# Patient Record
Sex: Male | Born: 1945
Health system: Southern US, Community
[De-identification: ages and names within clinical notes are randomized; demographics above are authoritative.]

## PROBLEM LIST (undated history)

## (undated) DIAGNOSIS — E119 Type 2 diabetes mellitus without complications: Secondary | ICD-10-CM

## (undated) DIAGNOSIS — I10 Essential (primary) hypertension: Secondary | ICD-10-CM

## (undated) DIAGNOSIS — E785 Hyperlipidemia, unspecified: Secondary | ICD-10-CM

## (undated) DIAGNOSIS — M1990 Unspecified osteoarthritis, unspecified site: Secondary | ICD-10-CM

## (undated) DIAGNOSIS — S22009A Unspecified fracture of unspecified thoracic vertebra, initial encounter for closed fracture: Secondary | ICD-10-CM

## (undated) DIAGNOSIS — N289 Disorder of kidney and ureter, unspecified: Secondary | ICD-10-CM

## (undated) DIAGNOSIS — I252 Old myocardial infarction: Secondary | ICD-10-CM

## (undated) DIAGNOSIS — I4891 Unspecified atrial fibrillation: Secondary | ICD-10-CM

## (undated) DIAGNOSIS — K8020 Calculus of gallbladder without cholecystitis without obstruction: Secondary | ICD-10-CM

## (undated) DIAGNOSIS — R011 Cardiac murmur, unspecified: Secondary | ICD-10-CM

## (undated) DIAGNOSIS — Z978 Presence of other specified devices: Secondary | ICD-10-CM

## (undated) DIAGNOSIS — Z86010 Personal history of colonic polyps: Secondary | ICD-10-CM

## (undated) HISTORY — DX: Type 2 diabetes mellitus without complications: E11.9

## (undated) HISTORY — DX: Disorder of kidney and ureter, unspecified: N28.9

## (undated) HISTORY — DX: Essential (primary) hypertension: I10

## (undated) HISTORY — PX: CATARACT EXTRACTION, BILATERAL: SHX1313

## (undated) HISTORY — DX: Unspecified fracture of unspecified thoracic vertebra, initial encounter for closed fracture: S22.009A

## (undated) HISTORY — DX: Presence of other specified devices: Z97.8

## (undated) HISTORY — DX: Calculus of gallbladder without cholecystitis without obstruction: K80.20

## (undated) HISTORY — PX: ESOPHAGOGASTRODUODENOSCOPY: SHX1529

## (undated) HISTORY — DX: Personal history of colonic polyps: Z86.010

## (undated) HISTORY — PX: DEBRIDEMENT TENNIS ELBOW: SHX1442

## (undated) HISTORY — DX: Hyperlipidemia, unspecified: E78.5

## (undated) HISTORY — PX: COLONOSCOPY: SHX174

## (undated) HISTORY — PX: TONSILLECTOMY AND ADENOIDECTOMY: SHX28

## (undated) HISTORY — PX: OTHER SURGICAL HISTORY: SHX169

---

## 2004-10-02 ENCOUNTER — Ambulatory Visit: Payer: Self-pay | Admitting: Family Medicine

## 2005-02-15 ENCOUNTER — Ambulatory Visit: Payer: Self-pay | Admitting: Family Medicine

## 2005-03-26 ENCOUNTER — Ambulatory Visit: Payer: Self-pay | Admitting: Family Medicine

## 2005-05-24 ENCOUNTER — Ambulatory Visit: Payer: Self-pay | Admitting: Family Medicine

## 2005-06-25 ENCOUNTER — Ambulatory Visit: Payer: Self-pay | Admitting: Family Medicine

## 2005-07-24 ENCOUNTER — Ambulatory Visit: Payer: Self-pay | Admitting: Family Medicine

## 2005-10-23 ENCOUNTER — Ambulatory Visit: Payer: Self-pay | Admitting: Family Medicine

## 2005-11-29 ENCOUNTER — Ambulatory Visit: Payer: Self-pay | Admitting: Family Medicine

## 2012-03-26 ENCOUNTER — Institutional Professional Consult (permissible substitution): Payer: Self-pay | Admitting: Cardiology

## 2013-09-23 ENCOUNTER — Encounter: Payer: Self-pay | Admitting: Internal Medicine

## 2013-10-21 ENCOUNTER — Encounter: Payer: Self-pay | Admitting: Internal Medicine

## 2013-10-21 ENCOUNTER — Ambulatory Visit (INDEPENDENT_AMBULATORY_CARE_PROVIDER_SITE_OTHER): Payer: Medicare HMO | Admitting: Internal Medicine

## 2013-10-21 VITALS — BP 194/70 | HR 64 | Ht 68.25 in | Wt 197.0 lb

## 2013-10-21 DIAGNOSIS — R198 Other specified symptoms and signs involving the digestive system and abdomen: Secondary | ICD-10-CM

## 2013-10-21 DIAGNOSIS — R634 Abnormal weight loss: Secondary | ICD-10-CM

## 2013-10-21 DIAGNOSIS — R197 Diarrhea, unspecified: Secondary | ICD-10-CM | POA: Insufficient documentation

## 2013-10-21 DIAGNOSIS — R194 Change in bowel habit: Secondary | ICD-10-CM

## 2013-10-21 MED ORDER — SUPREP BOWEL PREP KIT 17.5-3.13-1.6 GM/177ML PO SOLN: 1.0000 | ORAL | Status: DC

## 2013-10-21 NOTE — Progress Notes (Signed)
Subjective:  Referred by: Particia Nearing [facility]   Patient ID: Connor Bryant, male    DOB: [birth year], 68 y.o.   MRN: [id number]  HPI The patient is a very nice 68 year old man with a 1 year history of increasing gas and loose bowel movements. He has smaller caliber stools at times and intermittent basis as well. He has been on metformin for some time, and the symptoms started after the initiation of this but a good period of time like years after. Somewhere in here his dose was increased to 1000 mg at night along with his regular 500 mg in the morning but again he cannot correlate the change in bowel habits with change in metformin dose. He denies any rectal bleeding. He has never had a colonoscopy for screening. He has lost about 15 pounds in the last year according to his primary care provider. This is unintentional. His hemoglobin A1c has been below 6.5. This is per patient. GI review of systems is otherwise negative. No Known Allergies Outpatient Prescriptions Prior to Visit  Medication Sig Dispense Refill  . allopurinol (ZYLOPRIM) 100 MG tablet Take 100 mg by mouth daily.       Marland Kitchen atorvastatin (LIPITOR) 20 MG tablet Take 20 mg by mouth daily.      . hydrochlorothiazide (HYDRODIURIL) 25 MG tablet Take 25 mg by mouth daily.      . metFORMIN (GLUMETZA) 500 MG (MOD) 24 hr tablet Take 500 mg by mouth. Take 1 tablet in the morning and 2 tablets every evening.      . metoprolol succinate (TOPROL-XL) 100 MG 24 hr tablet Take 100 mg by mouth daily. Take with or immediately following a meal.      . trandolapril (MAVIK) 4 MG tablet Take 4 mg by mouth daily.      . verapamil (COVERA HS) 240 MG (CO) 24 hr tablet Take 240 mg by mouth. Take 2 capsules in the morning once a day      . trandolapril-verapamil (TARKA) 2-240 MG per tablet Take 2 tablets by mouth daily.       No facility-administered medications prior to visit.   Past Medical History  Diagnosis Date  . Thoracic spine fracture   . HTN  (hypertension)   . DM type 2 (diabetes mellitus, type 2)   . Hyperlipidemia   . Renal insufficiency     mild   Past Surgical History  Procedure Laterality Date  . Debridement tennis elbow Right    History   Social History  . Marital Status: Single    Spouse Name: N/A    Number of Children: N/A  . Years of Education: N/A   Social History Main Topics  . Smoking status: Former Research scientist (life sciences)  . Smokeless tobacco: None  . Alcohol Use: Yes     Comment: 3-4 beers every night  . Drug Use: No   Social History Narrative   Patient is married, he is retired. 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   Family History  Problem Relation Age of Onset  . Hypertension Father   . Coronary artery disease Father   . Hyperlipidemia Father   . Colon cancer Neg Hx   . Throat cancer Neg Hx   . Diabetes Mother   . Diabetes Father   . Heart disease Mother   . Kidney disease Neg Hx   . Bladder Cancer Father   . Liver disease Neg Hx     Review of Systems  Some joint pains, and tremors, bilateral. all other review of systems negative or as per history of present illness.    Objective:   Physical Exam General:  Well-developed, well-nourished and in no acute distress Eyes:  anicteric. ENT:   Mouth and posterior pharynx free of lesions.  Neck:   supple w/o thyromegaly or mass.  Lungs: Clear to auscultation bilaterally. Heart:  S1S2, no rubs, murmurs, gallops. Abdomen:  soft, non-tender, no hepatosplenomegaly, hernia, or mass and BS+.  Rectal: Deferred until colonoscopy Lymph:  no cervical or supraclavicular adenopathy. Extremities:   no edema Neuro:  A&O x 3.  Psych:  appropriate mood and  Affect.   Data Reviewed: PCP notes from 09/02/2013.     Assessment & Plan:   1. Change in bowel habits   2. Loss of weight

## 2013-10-21 NOTE — Assessment & Plan Note (Signed)
15 pound unintentional weight loss in the last year. He reports good control his blood sugars so I don't think it's related to hyperglycemia. Await colonoscopy.

## 2013-10-21 NOTE — Assessment & Plan Note (Signed)
Looser and more narrow caliber stools on an intermittent basis. Cause not clear. Will investigate with colonoscopy keeping in mind in metformin could be the cause.The risks and benefits as well as alternatives of endoscopic procedure(s) have been discussed and reviewed. All questions answered. The patient agrees to proceed.

## 2013-10-21 NOTE — Patient Instructions (Signed)
You have been scheduled for a colonoscopy with propofol. Please follow written instructions given to you at your visit today.  Please pick up your prep kit at the pharmacy within the next 1-3 days. If you use inhalers (even only as needed), please bring them with you on the day of your procedure. Your physician has requested that you go to www.startemmi.com and enter the access code given to you at your visit today. This web site gives a general overview about your procedure. However, you should still follow specific instructions given to you by our office regarding your preparation for the procedure.  Thank you for choosing me and [facility]!

## 2013-10-22 ENCOUNTER — Encounter: Payer: Self-pay | Admitting: Internal Medicine

## 2013-11-17 ENCOUNTER — Encounter: Payer: Self-pay | Admitting: Internal Medicine

## 2013-11-17 ENCOUNTER — Ambulatory Visit (AMBULATORY_SURGERY_CENTER): Payer: Medicare HMO | Admitting: Internal Medicine

## 2013-11-17 VITALS — BP 169/68 | HR 52 | Temp 99.4°F | Resp 19 | Ht 68.25 in | Wt 197.0 lb

## 2013-11-17 DIAGNOSIS — R634 Abnormal weight loss: Secondary | ICD-10-CM

## 2013-11-17 DIAGNOSIS — R198 Other specified symptoms and signs involving the digestive system and abdomen: Secondary | ICD-10-CM

## 2013-11-17 DIAGNOSIS — D128 Benign neoplasm of rectum: Secondary | ICD-10-CM

## 2013-11-17 DIAGNOSIS — K633 Ulcer of intestine: Secondary | ICD-10-CM

## 2013-11-17 DIAGNOSIS — D129 Benign neoplasm of anus and anal canal: Secondary | ICD-10-CM

## 2013-11-17 DIAGNOSIS — D126 Benign neoplasm of colon, unspecified: Secondary | ICD-10-CM

## 2013-11-17 DIAGNOSIS — K5730 Diverticulosis of large intestine without perforation or abscess without bleeding: Secondary | ICD-10-CM

## 2013-11-17 MED ORDER — SODIUM CHLORIDE 0.9 % IV SOLN: 500.0000 mL | INTRAVENOUS | Status: DC

## 2013-11-17 NOTE — Progress Notes (Signed)
Called to room to assist during endoscopic procedure.  Patient ID and intended procedure confirmed with present staff. Received instructions for my participation in the procedure from the performing physician.  

## 2013-11-17 NOTE — Progress Notes (Signed)
No egg or soy allergy. ewm No problems with past sedation. emw 

## 2013-11-17 NOTE — Patient Instructions (Addendum)
There were two small ulcers in the colon. I removed a small rectal polyp - benign I think. You also have a condition called diverticulosis - common and not usually a problem. Please read the handout provided.  My office will call with results next week.  I appreciate the opportunity to care for you. Gatha Mayer, MD, FACG  YOU HAD AN ENDOSCOPIC PROCEDURE TODAY AT [facility]: Refer to the procedure report that was given to you for any specific questions about what was found during the examination.  If the procedure report does not answer your questions, please call your gastroenterologist to clarify.  If you requested that your care partner not be given the details of your procedure findings, then the procedure report has been included in a sealed envelope for you to review at your convenience later.  YOU SHOULD EXPECT: Some feelings of bloating in the abdomen. Passage of more gas than usual.  Walking can help get rid of the air that was put into your GI tract during the procedure and reduce the bloating. If you had a lower endoscopy (such as a colonoscopy or flexible sigmoidoscopy) you may notice spotting of blood in your stool or on the toilet paper. If you underwent a bowel prep for your procedure, then you may not have a normal bowel movement for a few days.  DIET: Your first meal following the procedure should be a light meal and then it is ok to progress to your normal diet.  A half-sandwich or bowl of soup is an example of a good first meal.  Heavy or fried foods are harder to digest and may make you feel nauseous or bloated.  Likewise meals heavy in dairy and vegetables can cause extra gas to form and this can also increase the bloating.  Drink plenty of fluids but you should avoid alcoholic beverages for 24 hours.  ACTIVITY: Your care partner should take you home directly after the procedure.  You should plan to take it easy, moving slowly for the rest of the day.  You can  resume normal activity the day after the procedure however you should NOT DRIVE or use heavy machinery for 24 hours (because of the sedation medicines used during the test).    SYMPTOMS TO REPORT IMMEDIATELY: A gastroenterologist can be reached at any hour.  During normal business hours, 8:30 AM to 5:00 PM Monday through Friday, call [phone].  After hours and on weekends, please call the GI answering service at [phone] who will take a message and have the physician on call contact you.   Following lower endoscopy (colonoscopy or flexible sigmoidoscopy):  Excessive amounts of blood in the stool  Significant tenderness or worsening of abdominal pains  Swelling of the abdomen that is new, acute  Fever of 100F or higher   FOLLOW UP: If any biopsies were taken you will be contacted by phone or by letter within the next 1-3 weeks.  Call your gastroenterologist if you have not heard about the biopsies in 3 weeks.  Our staff will call the home number listed on your records the next business day following your procedure to check on you and address any questions or concerns that you may have at that time regarding the information given to you following your procedure. This is a courtesy call and so if there is no answer at the home number and we have not heard from you through the emergency physician on call, we will  assume that you have returned to your regular daily activities without incident.  SIGNATURES/CONFIDENTIALITY: You and/or your care partner have signed paperwork which will be entered into your electronic medical record.  These signatures attest to the fact that that the information above on your After Visit Summary has been reviewed and is understood.  Full responsibility of the confidentiality of this discharge information lies with you and/or your care-partner.  Recommendations Timing of repeat colonoscopy will be determined by pathology findings.  Office will call with the  results.

## 2013-11-17 NOTE — Progress Notes (Signed)
Report to pacu rn, vss, bbs=clear 

## 2013-11-17 NOTE — Op Note (Signed)
[place]  [address] [place], [id number]   COLONOSCOPY PROCEDURE REPORT  PATIENT: Connor Bryant, Connor Bryant  MR#: [id number] BIRTHDATE: 15-Dec-1945 , 67  yrs. old GENDER: Male ENDOSCOPIST: Gatha Mayer, MD, [facility] PROCEDURE DATE:  11/17/2013 PROCEDURE:   Colonoscopy with biopsy and snare polypectomy First Screening Colonoscopy - Avg.  risk and is 50 yrs.  old or older - No.  Prior Negative Screening - Now for repeat screening. N/A  History of Adenoma - Now for follow-up colonoscopy & has been > or = to 3 yrs.  N/A  Polyps Removed Today? Yes. ASA CLASS:   Class II INDICATIONS:Change in bowel habits. MEDICATIONS: Propofol (Diprivan) 220 mg IV, MAC sedation, administered by CRNA, and These medications were titrated to patient response per physician's verbal order  DESCRIPTION OF PROCEDURE:   After the risks benefits and alternatives of the procedure were thoroughly explained, informed consent was obtained.  A digital rectal exam revealed no abnormalities of the rectum, A digital rectal exam revealed no prostatic nodules, and A digital rectal exam revealed the prostate was not enlarged.   The LB [id number]  endoscope was introduced through the anus and advanced to the terminal ileum which was intubated for a short distance. No adverse events experienced.   The quality of the prep was Suprep adequate  The instrument was then slowly withdrawn as the colon was fully examined.      COLON FINDINGS: Two small ulcers were found in the ascending colon. Multiple biopsies were performed using cold forceps.   A sessile polyp measuring 5 mm in size was found in the rectum.  A polypectomy was performed with a cold snare.  The resection was complete and the polyp tissue was completely retrieved. Diverticulosis was noted in the sigmoid colon.   The colon mucosa was otherwise normal.   A right colon retroflexion was performed. Retroflexed views revealed no abnormalities. The  time to cecum=1 minutes 35 seconds.  Withdrawal time=11 minutes 36 seconds.  The scope was withdrawn and the procedure completed. COMPLICATIONS: There were no complications.  ENDOSCOPIC IMPRESSION: 1.   Small ulcers (2) in the ascending colon; multiple biopsies were performed using cold forceps 2.   Sessile polyp measuring 5 mm in size was found in the rectum; polypectomy was performed with a cold snare 3.   Diverticulosis was noted in the sigmoid colon 4.   The colon mucosa was otherwise normal  RECOMMENDATIONS: 1.  Timing of repeat colonoscopy will be determined by pathology findings. 2.  Office will call with the results.   eSigned:  Gatha Mayer, MD, [facility] 11/17/2013 3:15 PM   cc: The Patient  and Particia Nearing, PA-C   PATIENT NAME:  Connor Bryant, Connor Bryant MR#: [id number]

## 2013-11-18 ENCOUNTER — Telehealth: Payer: Self-pay | Admitting: *Deleted

## 2013-11-18 NOTE — Telephone Encounter (Signed)
  Follow up Call-  Call back number 11/17/2013  Post procedure Call Back phone  # [phone] cell  Permission to leave phone message Yes     Patient questions:  Do you have a fever, pain , or abdominal swelling? no Pain Score  0 *  Have you tolerated food without any problems? yes  Have you been able to return to your normal activities? yes  Do you have any questions about your discharge instructions: Diet   no Medications  no Follow up visit  no  Do you have questions or concerns about your Care? no  Actions: * If pain score is 4 or above: No action needed, pain <4.

## 2013-11-24 ENCOUNTER — Encounter: Payer: Self-pay | Admitting: Internal Medicine

## 2013-11-24 DIAGNOSIS — Z860100 Personal history of colon polyps, unspecified: Secondary | ICD-10-CM

## 2013-11-24 HISTORY — DX: Personal history of colonic polyps: Z86.010

## 2013-11-24 HISTORY — DX: Personal history of colon polyps, unspecified: Z86.0100

## 2013-11-24 NOTE — Progress Notes (Signed)
Quick Note:  Polyp benign but pre-cancerous - needs repeat colon 2020  Ulcers probably not an issue - show non-specific inflammation - but looser stools and the ulcers raise ? Of IBD like Crohn's  I need him to do an IBD panel from [place] - no letter, place 2020 colon recall ______

## 2013-11-25 ENCOUNTER — Other Ambulatory Visit: Payer: Self-pay

## 2013-11-25 DIAGNOSIS — K633 Ulcer of intestine: Secondary | ICD-10-CM

## 2013-11-25 DIAGNOSIS — R197 Diarrhea, unspecified: Secondary | ICD-10-CM

## 2013-11-30 ENCOUNTER — Other Ambulatory Visit: Payer: Medicare HMO

## 2013-11-30 DIAGNOSIS — R197 Diarrhea, unspecified: Secondary | ICD-10-CM

## 2013-11-30 DIAGNOSIS — K633 Ulcer of intestine: Secondary | ICD-10-CM

## 2013-12-02 LAB — INFLAMMATORY BOWEL DISEASE-IBD
Atypical pANCA: <1:20 {titer}
Saccharomyces cerevisiae, IgA: <20 Units (ref 0.0–24.9)
Saccharomyces cerevisiae, IgG: <20 Units (ref 0.0–24.9)

## 2013-12-04 NOTE — Progress Notes (Signed)
Quick Note:  This test does NOT indicate Crohn's or ulcerative colitis How is he (diarrhea?) ______

## 2013-12-04 NOTE — Progress Notes (Signed)
Quick Note:  Ask him to completely hold metformin for several days and report back  ______

## 2013-12-14 ENCOUNTER — Telehealth: Payer: Self-pay | Admitting: Internal Medicine

## 2013-12-14 NOTE — Telephone Encounter (Signed)
Left message for patient to call back  

## 2013-12-14 NOTE — Telephone Encounter (Signed)
Patient wanted Dr. Carlean Purl to know that he is still having irregular BM but better than he was on Metformin.  He is going to return to his primary care for an alternative to Metformin

## 2013-12-15 NOTE — Telephone Encounter (Signed)
Noted See me prn

## 2013-12-16 ENCOUNTER — Telehealth: Payer: Self-pay | Admitting: Internal Medicine

## 2013-12-16 DIAGNOSIS — R197 Diarrhea, unspecified: Secondary | ICD-10-CM

## 2013-12-16 MED ORDER — COLESTIPOL HCL 5 G PO GRAN: 5.0000 g | GRANULES | Freq: Every day | ORAL | Status: DC

## 2013-12-16 NOTE — Telephone Encounter (Signed)
Patient reports that diarrhea has come back 4-5 hours at a time multiple episodes for the last 2 days.  He is going to try imodium per package instructions over the weekend and he will call me with an update.  Dr. Carlean Purl do you have any other recommendations?

## 2013-12-16 NOTE — Telephone Encounter (Signed)
Left message for patient to call back  

## 2013-12-16 NOTE — Telephone Encounter (Signed)
Patient advised.  He will come for labs on Friday.   REV scheduled for 02/03/14

## 2013-12-16 NOTE — Telephone Encounter (Signed)
1) Stool for ova and parasite screen 2) TTG Ab and IgA level 3) CBC with diff 4) ESR 4) after labs done colestipol 5 g with supper each day #1 can 1 refill 5) get him an REV with me next avail ok for now

## 2013-12-18 ENCOUNTER — Other Ambulatory Visit (INDEPENDENT_AMBULATORY_CARE_PROVIDER_SITE_OTHER): Payer: Medicare HMO

## 2013-12-18 DIAGNOSIS — R197 Diarrhea, unspecified: Secondary | ICD-10-CM

## 2013-12-18 LAB — CBC WITH DIFFERENTIAL/PLATELET
Basophils Absolute: 0.1 10*3/uL (ref 0.0–0.1)
Basophils Relative: 0.6 % (ref 0.0–3.0)
EOS PCT: 12.1 % — AB (ref 0.0–5.0)
Eosinophils Absolute: 1.4 10*3/uL — ABNORMAL HIGH (ref 0.0–0.7)
HEMATOCRIT: 37.8 % — AB (ref 39.0–52.0)
Hemoglobin: 12.6 g/dL — ABNORMAL LOW (ref 13.0–17.0)
LYMPHS ABS: 2.9 10*3/uL (ref 0.7–4.0)
Lymphocytes Relative: 24.4 % (ref 12.0–46.0)
MCHC: 33.4 g/dL (ref 30.0–36.0)
MCV: 88.3 fl (ref 78.0–100.0)
Monocytes Absolute: 0.7 10*3/uL (ref 0.1–1.0)
Monocytes Relative: 5.9 % (ref 3.0–12.0)
Neutro Abs: 6.7 10*3/uL (ref 1.4–7.7)
Neutrophils Relative %: 57 % (ref 43.0–77.0)
Platelets: 230 10*3/uL (ref 150.0–400.0)
RBC: 4.28 Mil/uL (ref 4.22–5.81)
RDW: 15.1 % — ABNORMAL HIGH (ref 11.5–14.6)
WBC: 11.7 10*3/uL — ABNORMAL HIGH (ref 4.5–10.5)

## 2013-12-18 LAB — IGA: IGA: 279 mg/dL (ref 68–378)

## 2013-12-18 LAB — SEDIMENTATION RATE: Sed Rate: 50 mm/hr — ABNORMAL HIGH (ref 0–22)

## 2013-12-21 ENCOUNTER — Other Ambulatory Visit: Payer: Medicare HMO

## 2013-12-21 DIAGNOSIS — R197 Diarrhea, unspecified: Secondary | ICD-10-CM

## 2013-12-21 LAB — TISSUE TRANSGLUTAMINASE, IGA: Tissue Transglutaminase Ab, IgA: 4.9 U/mL (ref <20)

## 2013-12-22 LAB — OVA AND PARASITE EXAMINATION: OP: NONE SEEN

## 2013-12-22 NOTE — Progress Notes (Signed)
Quick Note:  Labs show mild anemia, sed rate high goes along with inflammation but not specific Does not have celiac disease and does not have parasites  1) B12 and ferritin for anemia 2) he had lost weight - given that and the anemia I recommend an EGD - can use the Thurs spot I had blocked if he wants or can wait til later ______

## 2013-12-24 ENCOUNTER — Other Ambulatory Visit: Payer: Self-pay

## 2013-12-24 DIAGNOSIS — D649 Anemia, unspecified: Secondary | ICD-10-CM

## 2013-12-29 ENCOUNTER — Ambulatory Visit (AMBULATORY_SURGERY_CENTER): Payer: Self-pay

## 2013-12-29 ENCOUNTER — Other Ambulatory Visit (INDEPENDENT_AMBULATORY_CARE_PROVIDER_SITE_OTHER): Payer: Medicare HMO

## 2013-12-29 VITALS — Ht 68.0 in | Wt 184.6 lb

## 2013-12-29 DIAGNOSIS — R634 Abnormal weight loss: Secondary | ICD-10-CM

## 2013-12-29 DIAGNOSIS — R197 Diarrhea, unspecified: Secondary | ICD-10-CM

## 2013-12-29 DIAGNOSIS — D649 Anemia, unspecified: Secondary | ICD-10-CM

## 2013-12-29 LAB — VITAMIN B12: Vitamin B-12: 397 pg/mL (ref 211–911)

## 2013-12-29 LAB — FERRITIN: Ferritin: 73.2 ng/mL (ref 22.0–322.0)

## 2014-01-04 ENCOUNTER — Encounter: Payer: Self-pay | Admitting: Internal Medicine

## 2014-01-06 ENCOUNTER — Encounter: Payer: Self-pay | Admitting: Internal Medicine

## 2014-01-06 ENCOUNTER — Ambulatory Visit (AMBULATORY_SURGERY_CENTER): Payer: Medicare HMO | Admitting: Internal Medicine

## 2014-01-06 VITALS — BP 130/73 | HR 58 | Temp 98.0°F | Resp 13 | Ht 68.0 in | Wt 184.0 lb

## 2014-01-06 DIAGNOSIS — R634 Abnormal weight loss: Secondary | ICD-10-CM

## 2014-01-06 DIAGNOSIS — R197 Diarrhea, unspecified: Secondary | ICD-10-CM

## 2014-01-06 DIAGNOSIS — K319 Disease of stomach and duodenum, unspecified: Secondary | ICD-10-CM

## 2014-01-06 MED ORDER — SODIUM CHLORIDE 0.9 % IV SOLN: 500.0000 mL | INTRAVENOUS | Status: DC

## 2014-01-06 NOTE — Patient Instructions (Addendum)
There was some mild inflammation in the stomach and I took biopsies of this. Also took biopsies of the small intestine to see if there are any clues to your diarrhea.  I will call with results and plans.  I appreciate the opportunity to care for you. Gatha Mayer, MD, FACG  YOU HAD AN ENDOSCOPIC PROCEDURE TODAY AT [facility]: Refer to the procedure report that was given to you for any specific questions about what was found during the examination.  If the procedure report does not answer your questions, please call your gastroenterologist to clarify.  If you requested that your care partner not be given the details of your procedure findings, then the procedure report has been included in a sealed envelope for you to review at your convenience later.  YOU SHOULD EXPECT: Some feelings of bloating in the abdomen. Passage of more gas than usual.  Walking can help get rid of the air that was put into your GI tract during the procedure and reduce the bloating. If you had a lower endoscopy (such as a colonoscopy or flexible sigmoidoscopy) you may notice spotting of blood in your stool or on the toilet paper. If you underwent a bowel prep for your procedure, then you may not have a normal bowel movement for a few days.  DIET: Your first meal following the procedure should be a light meal and then it is ok to progress to your normal diet.  A half-sandwich or bowl of soup is an example of a good first meal.  Heavy or fried foods are harder to digest and may make you feel nauseous or bloated.  Likewise meals heavy in dairy and vegetables can cause extra gas to form and this can also increase the bloating.  Drink plenty of fluids but you should avoid alcoholic beverages for 24 hours.  ACTIVITY: Your care partner should take you home directly after the procedure.  You should plan to take it easy, moving slowly for the rest of the day.  You can resume normal activity the day after the procedure  however you should NOT DRIVE or use heavy machinery for 24 hours (because of the sedation medicines used during the test).    SYMPTOMS TO REPORT IMMEDIATELY: A gastroenterologist can be reached at any hour.  During normal business hours, 8:30 AM to 5:00 PM Monday through Friday, call [phone].  After hours and on weekends, please call the GI answering service at [phone] who will take a message and have the physician on call contact you.   Following upper endoscopy (EGD)  Vomiting of blood or coffee ground material  New chest pain or pain under the shoulder blades  Painful or persistently difficult swallowing  New shortness of breath  Fever of 100F or higher  Black, tarry-looking stools  FOLLOW UP: If any biopsies were taken you will be contacted by phone or by letter within the next 1-3 weeks.  Call your gastroenterologist if you have not heard about the biopsies in 3 weeks.  Our staff will call the home number listed on your records the next business day following your procedure to check on you and address any questions or concerns that you may have at that time regarding the information given to you following your procedure. This is a courtesy call and so if there is no answer at the home number and we have not heard from you through the emergency physician on call, we will assume that you  have returned to your regular daily activities without incident.  SIGNATURES/CONFIDENTIALITY: You and/or your care partner have signed paperwork which will be entered into your electronic medical record.  These signatures attest to the fact that that the information above on your After Visit Summary has been reviewed and is understood.  Full responsibility of the confidentiality of this discharge information lies with you and/or your care-partner.

## 2014-01-06 NOTE — Progress Notes (Signed)
Called to room to assist during endoscopic procedure.  Patient ID and intended procedure confirmed with present staff. Received instructions for my participation in the procedure from the performing physician.  

## 2014-01-06 NOTE — Progress Notes (Signed)
A/ox3 pleased with MAC, report to Karol RN 

## 2014-01-06 NOTE — Op Note (Signed)
[place]  [address]. [place], [address]   ENDOSCOPY PROCEDURE REPORT  PATIENT: Connor Bryant, Connor Bryant  MR#: [id number] BIRTHDATE: 04-23-1946 , 68  yrs. old GENDER: Male ENDOSCOPIST: Gatha Mayer, MD, [facility] PROCEDURE DATE:  01/06/2014 PROCEDURE:  EGD w/ biopsy ASA CLASS:     Class II INDICATIONS:  Weight loss.  Diarrhea MEDICATIONS: Propofol (Diprivan) 160 mg IV, MAC sedation, administered by CRNA, and These medications were titrated to patient response per physician's verbal order TOPICAL ANESTHETIC: none  DESCRIPTION OF PROCEDURE: After the risks benefits and alternatives of the procedure were thoroughly explained, informed consent was obtained.  The LB [id number] endoscope was introduced through the mouth and advanced to the second portion of the duodenum. Without limitations.  The instrument was slowly withdrawn as the mucosa was fully examined.    STOMACH: Small patches of abnormal mucosa were found in the stomach. Proximal and distal. The mucosa was erythematous, edematous and had granularity.  Multiple biopsies were performed using cold forceps. Sample sent for histology.  The remainder of the upper endoscopy exam was otherwise normal. Duodenal biopise taken to look for occult cause of malabsorption that could cause his sxs. Retroflexed views revealed as above. The scope was then withdrawn from the patient and the procedure completed.  COMPLICATIONS: There were no complications. ENDOSCOPIC IMPRESSION: 1.   Small abnormal mucosa was found in the stomach; The mucosa was erythematous, edematous and had granularity; multiple biopsies 2.   The remainder of the upper endoscopy exam was otherwise normal - duodenal biopsy taken  RECOMMENDATIONS: Office will call with results - note metformin discontinued without help for diarrhea, colestipol somewhat helpful   eSigned:  Gatha Mayer, MD, [facility] 01/06/2014 8:34 AM   LN:[id number] Melina Copa,  DO

## 2014-01-07 ENCOUNTER — Telehealth: Payer: Self-pay | Admitting: *Deleted

## 2014-01-07 NOTE — Telephone Encounter (Signed)
  Follow up Call-  Call back number 01/06/2014 11/17/2013  Post procedure Call Back phone  # [phone], [phone]  [phone] cell  Permission to leave phone message Yes Yes     Patient questions:  Do you have a fever, pain , or abdominal swelling? no Pain Score  0 *  Have you tolerated food without any problems? yes  Have you been able to return to your normal activities? yes  Do you have any questions about your discharge instructions: Diet   no Medications  no Follow up visit  no  Do you have questions or concerns about your Care? no  Actions: * If pain score is 4 or above: No action needed, pain <4.

## 2014-01-12 NOTE — Progress Notes (Signed)
Quick Note:  1) Biopies do not show any problems 2) I recommend he do CT abd/pelvis with contrast re: weight loss, diarrhea (negative EGD and colonoscopy)  No recall or letter from [facility] ______

## 2014-01-18 ENCOUNTER — Telehealth: Payer: Self-pay | Admitting: Internal Medicine

## 2014-01-18 DIAGNOSIS — R634 Abnormal weight loss: Secondary | ICD-10-CM

## 2014-01-18 DIAGNOSIS — R197 Diarrhea, unspecified: Secondary | ICD-10-CM

## 2014-01-18 NOTE — Telephone Encounter (Signed)
See pathology results for additional details 

## 2014-01-19 ENCOUNTER — Other Ambulatory Visit (INDEPENDENT_AMBULATORY_CARE_PROVIDER_SITE_OTHER): Payer: Medicare HMO

## 2014-01-19 DIAGNOSIS — R634 Abnormal weight loss: Secondary | ICD-10-CM

## 2014-01-19 DIAGNOSIS — R197 Diarrhea, unspecified: Secondary | ICD-10-CM

## 2014-01-19 LAB — CREATININE, SERUM: Creatinine, Ser: 0.8 mg/dL (ref 0.4–1.5)

## 2014-01-19 LAB — BUN: BUN: 16 mg/dL (ref 6–23)

## 2014-01-22 ENCOUNTER — Inpatient Hospital Stay: Admission: RE | Admit: 2014-01-22 | Payer: Medicare HMO | Source: Ambulatory Visit

## 2014-01-26 ENCOUNTER — Ambulatory Visit (INDEPENDENT_AMBULATORY_CARE_PROVIDER_SITE_OTHER)
Admission: RE | Admit: 2014-01-26 | Discharge: 2014-01-26 | Disposition: A | Discharge status: Home / Self Care | Payer: Medicare HMO | Source: Ambulatory Visit | Attending: Internal Medicine | Admitting: Internal Medicine

## 2014-01-26 DIAGNOSIS — R197 Diarrhea, unspecified: Secondary | ICD-10-CM

## 2014-01-26 DIAGNOSIS — R634 Abnormal weight loss: Secondary | ICD-10-CM

## 2014-01-26 MED ORDER — IOHEXOL 300 MG/ML  SOLN
100.0000 mL | Freq: Once | INTRAMUSCULAR | Status: AC | PRN
  Administered 2014-01-26: 100 mL via INTRAVENOUS

## 2014-01-26 NOTE — Progress Notes (Signed)
Quick Note:  CT scan is showing narrowing of arteries that supply intestine So, poor blood flow to intestine could be his problem though usually we see more pain than I thought he had with that (pain after eating).  I want him to be seen by a vascular surgeon to see what they think about his problems - could need further study, stenting or surgery  Please cc his PCP   ______

## 2014-02-03 ENCOUNTER — Ambulatory Visit: Payer: Medicare HMO | Admitting: Internal Medicine

## 2014-02-12 ENCOUNTER — Encounter: Payer: Self-pay | Admitting: Vascular Surgery

## 2014-02-16 ENCOUNTER — Encounter: Payer: Self-pay | Admitting: *Deleted

## 2014-02-16 ENCOUNTER — Other Ambulatory Visit: Payer: Self-pay | Admitting: *Deleted

## 2014-02-16 ENCOUNTER — Ambulatory Visit (INDEPENDENT_AMBULATORY_CARE_PROVIDER_SITE_OTHER): Payer: Medicare HMO | Admitting: Vascular Surgery

## 2014-02-16 ENCOUNTER — Encounter: Payer: Self-pay | Admitting: Vascular Surgery

## 2014-02-16 VITALS — BP 149/52 | HR 57 | Ht 68.0 in | Wt 179.0 lb

## 2014-02-16 DIAGNOSIS — I771 Stricture of artery: Secondary | ICD-10-CM

## 2014-02-16 DIAGNOSIS — K551 Chronic vascular disorders of intestine: Secondary | ICD-10-CM | POA: Insufficient documentation

## 2014-02-16 DIAGNOSIS — I739 Peripheral vascular disease, unspecified: Secondary | ICD-10-CM

## 2014-02-16 NOTE — Progress Notes (Signed)
Subjective:     Patient ID: Connor Bryant, male   DOB: 08-Jan-1946, 68 y.o.   MRN: [id number]  HPI this 68 year old male was referred by Dr. Carlean Purl for evaluation of possible mesenteric ischemia. Patient has been having diarrhea intermittently for the past 8 months ever since he retired in August of 2014 he states. Since that time he has lost 25 pounds while not dieting. He does not complain of postprandial pain but does have some bloating sensation in his lower abdomen. There is no blood in the diarrhea. He has no history of CVA, coronary artery disease with MI, or lower extremity claudication. He does have a heavy history of tobacco abuse 50+ pack years. He has a family history of vascular disease as well. He had a CT scan recently which revealed possible occlusion of the SMA with stenosis of the celiac axis and possible occlusion or stenosis of the left renal artery.  Past Medical History  Diagnosis Date  . Thoracic spine fracture   . HTN (hypertension)   . DM type 2 (diabetes mellitus, type 2)     no meds  . Hyperlipidemia   . Renal insufficiency     mild  . Personal history of colonic polyp-adenoma 11/24/2013    History  Substance Use Topics  . Smoking status: Former Smoker    Types: Cigarettes    Quit date: 09/24/2008  . Smokeless tobacco: Former Systems developer    Types: Chew     Comment: Pt just tried  for short time  . Alcohol Use: 4.8 oz/week    8 Cans of beer per week     Comment: 3-4 beers every night    Family History  Problem Relation Age of Onset  . Hypertension Father   . Coronary artery disease Father   . Hyperlipidemia Father   . Diabetes Father   . Bladder Cancer Father   . Cancer Father   . Heart disease Father   . Heart attack Father   . Colon cancer Neg Hx   . Throat cancer Neg Hx   . Kidney disease Neg Hx   . Liver disease Neg Hx   . Diabetes Mother   . Heart disease Mother     before age 21  . Hyperlipidemia Mother   . Hypertension Mother   . AAA (abdominal  aortic aneurysm) Mother     No Known Allergies  Current outpatient prescriptions:allopurinol (ZYLOPRIM) 100 MG tablet, Take 100 mg by mouth daily. , Disp: , Rfl: ;  aspirin 81 MG tablet, Take 81 mg by mouth daily., Disp: , Rfl: ;  atorvastatin (LIPITOR) 20 MG tablet, Take 20 mg by mouth daily., Disp: , Rfl: ;  colestipol (COLESTID) 5 G granules, Take 5 g by mouth daily. With supper, Disp: 500 g, Rfl: 1;  hydrochlorothiazide (HYDRODIURIL) 25 MG tablet, Take 25 mg by mouth daily., Disp: , Rfl:  metoprolol succinate (TOPROL-XL) 100 MG 24 hr tablet, Take 100 mg by mouth daily. Take with or immediately following a meal., Disp: , Rfl: ;  trandolapril (MAVIK) 4 MG tablet, Take 4 mg by mouth daily., Disp: , Rfl: ;  verapamil (COVERA HS) 240 MG (CO) 24 hr tablet, Take 240 mg by mouth. Take 2 capsules in the morning once a day, Disp: , Rfl:  metFORMIN (GLUMETZA) 500 MG (MOD) 24 hr tablet, Take 500 mg by mouth. Take 1 tablet in the morning and 2 tablets every evening., Disp: , Rfl:   BP 149/52  Pulse 57  Ht 5\' 8"  (1.727 m)  Wt 179 lb (81.194 kg)  BMI 27.22 kg/m2  SpO2 100%  Body mass index is 27.22 kg/(m^2).        \  Review of Systems denies chest pain, dyspnea on exertion, PND, orthopnea, hemoptysis, claudication, lateralizing weakness, aphasia, amaurosis fugax, syncope. All systems negative for complete review of systems other than GI tract    Objective:   Physical Exam BP 149/52  Pulse 57  Ht 5\' 8"  (1.727 m)  Wt 179 lb (81.194 kg)  BMI 27.22 kg/m2  SpO2 100%  Gen.-alert and oriented x3 in no apparent distress HEENT normal for age Lungs no rhonchi or wheezing Cardiovascular regular rhythm no murmurs carotid pulses 3+ palpable no bruits audible Abdomen soft nontender no palpable masses-no bruits audible Musculoskeletal free of  major deformities Skin clear -no rashes Neurologic normal Lower extremities 3+ femoral and dorsalis pedis pulses palpable bilaterally with no edema  Today  I reviewed the CT scan which was recently performed at [facility] and agree that there is heavy calcification at the origin of the SMA and celiac axis as well as a left renal artery. It is difficult to determine if these vessels are totally blocked or severely stenotic. There is some calcification in the abdominal aorta as well.        Assessment:     Possible mesenteric ischemia with possible occlusion or tight stenosis of SMA, celiac axis, with possible involvement left renal artery.    Plan:     Plan abdominal aortogram with mesenteric angiography and possible PTA and stenting of SMA and/or celiac if feasible by Dr. Trula Slade on Tuesday, June 2. Also discussed with patient that if this is not feasible he may require abdominal surgery for attempted bypass the SMA depending on findings of angiogram. He understands this and would like to proceed.

## 2014-02-18 ENCOUNTER — Ambulatory Visit (INDEPENDENT_AMBULATORY_CARE_PROVIDER_SITE_OTHER): Payer: Medicare HMO | Admitting: Internal Medicine

## 2014-02-18 ENCOUNTER — Encounter (HOSPITAL_COMMUNITY): Payer: Self-pay | Admitting: Pharmacy Technician

## 2014-02-18 ENCOUNTER — Encounter: Payer: Self-pay | Admitting: Internal Medicine

## 2014-02-18 VITALS — BP 134/60 | HR 64 | Ht 68.0 in | Wt 180.2 lb

## 2014-02-18 DIAGNOSIS — I771 Stricture of artery: Secondary | ICD-10-CM

## 2014-02-18 DIAGNOSIS — R197 Diarrhea, unspecified: Secondary | ICD-10-CM

## 2014-02-18 DIAGNOSIS — K551 Chronic vascular disorders of intestine: Secondary | ICD-10-CM

## 2014-02-18 DIAGNOSIS — R634 Abnormal weight loss: Secondary | ICD-10-CM

## 2014-02-18 NOTE — Assessment & Plan Note (Signed)
Wt Readings from Last 3 Encounters:  02/18/14 180 lb 4 oz (81.761 kg)  02/16/14 179 lb (81.194 kg)  01/06/14 184 lb (83.462 kg)

## 2014-02-18 NOTE — Progress Notes (Signed)
   Subjective:    Patient ID: Connor Bryant, male    DOB: [birth year], 68 y.o.   MRN: [id number]  HPI Still having diarrhea - saw Dr. Kellie Simmering and he has arranged for mesenteric arteriogram with possible stenting since there is celiac and SMA stenosis suspected on CT Wt Readings from Last 3 Encounters:  02/18/14 180 lb 4 oz (81.761 kg)  02/16/14 179 lb (81.194 kg)  01/06/14 184 lb (83.462 kg)   Colestipol helps some but is expensive. Review of Systems As above    Objective:   Physical Exam NAD    Assessment & Plan:  Diarrhea ? From SMA/celiac stenoses Await angiogram Switch from colestipol ($$$) to loperamide for now  SMA stenosis ? occlusion and celiac stenosis by CT Awaiting angiogram - certainly looks like he has mesenteric atherosclorosis -. Does not have classic mesenteric ischemia sxs but so far this is the most likely cause of his problems from workup to date.  Loss of weight Wt Readings from Last 3 Encounters:  02/18/14 180 lb 4 oz (81.761 kg)  02/16/14 179 lb (81.194 kg)  01/06/14 184 lb (83.462 kg)      HM:CNOBS, Londell Moh, PA-C [name], MD, Annamarie Major, MD

## 2014-02-18 NOTE — Assessment & Plan Note (Addendum)
?   From SMA/celiac stenoses Await angiogram Switch from colestipol ($$$) to loperamide for now

## 2014-02-18 NOTE — Assessment & Plan Note (Addendum)
Awaiting angiogram - certainly looks like he has mesenteric atherosclorosis -. Does not have classic mesenteric ischemia sxs but so far this is the most likely cause of his problems from workup to date.

## 2014-02-18 NOTE — Patient Instructions (Addendum)
Connor Bryant with your testing next week.  Per Dr. Carlean Purl it is ok to use the Immodium for your diarrhea.   I appreciate the opportunity to care for you.

## 2014-02-22 MED ORDER — SODIUM CHLORIDE 0.9 % IV SOLN
INTRAVENOUS | Status: DC
  Administered 2014-02-23: 10:00:00 via INTRAVENOUS

## 2014-02-23 ENCOUNTER — Telehealth: Payer: Self-pay | Admitting: Vascular Surgery

## 2014-02-23 ENCOUNTER — Encounter (HOSPITAL_COMMUNITY)
Admission: RE | Disposition: A | Discharge status: Home / Self Care | Payer: Self-pay | Source: Ambulatory Visit | Attending: Surgery

## 2014-02-23 ENCOUNTER — Ambulatory Visit (HOSPITAL_COMMUNITY)
Admission: RE | Admit: 2014-02-23 | Discharge: 2014-02-23 | Disposition: A | Discharge status: Home / Self Care | Payer: Medicare HMO | Source: Ambulatory Visit | Attending: Surgery | Admitting: Surgery

## 2014-02-23 DIAGNOSIS — K551 Chronic vascular disorders of intestine: Secondary | ICD-10-CM | POA: Insufficient documentation

## 2014-02-23 DIAGNOSIS — E785 Hyperlipidemia, unspecified: Secondary | ICD-10-CM | POA: Insufficient documentation

## 2014-02-23 DIAGNOSIS — Z87891 Personal history of nicotine dependence: Secondary | ICD-10-CM | POA: Insufficient documentation

## 2014-02-23 DIAGNOSIS — I10 Essential (primary) hypertension: Secondary | ICD-10-CM | POA: Insufficient documentation

## 2014-02-23 DIAGNOSIS — E119 Type 2 diabetes mellitus without complications: Secondary | ICD-10-CM | POA: Insufficient documentation

## 2014-02-23 HISTORY — PX: ABDOMINAL AORTAGRAM: SHX5454

## 2014-02-23 LAB — POCT I-STAT, CHEM 8
BUN: 19 mg/dL (ref 6–23)
CREATININE: 1 mg/dL (ref 0.50–1.35)
Calcium, Ion: 1.21 mmol/L (ref 1.13–1.30)
Chloride: 104 mEq/L (ref 96–112)
Glucose, Bld: 133 mg/dL — ABNORMAL HIGH (ref 70–99)
HCT: 36 % — ABNORMAL LOW (ref 39.0–52.0)
Hemoglobin: 12.2 g/dL — ABNORMAL LOW (ref 13.0–17.0)
Potassium: 3.7 mEq/L (ref 3.7–5.3)
Sodium: 143 mEq/L (ref 137–147)
TCO2: 22 mmol/L (ref 0–100)

## 2014-02-23 LAB — GLUCOSE, CAPILLARY: Glucose-Capillary: 130 mg/dL — ABNORMAL HIGH (ref 70–99)

## 2014-02-23 SURGERY — ABDOMINAL AORTAGRAM
Anesthesia: LOCAL | Site: Abdomen

## 2014-02-23 MED ORDER — SODIUM CHLORIDE 0.9 % IV SOLN: 1.0000 mL/kg/h | INTRAVENOUS | Status: DC

## 2014-02-23 MED ORDER — LIDOCAINE HCL (PF) 1 % IJ SOLN
INTRAMUSCULAR | Status: AC
  Filled 2014-02-23: qty 30

## 2014-02-23 MED ORDER — FENTANYL CITRATE 0.05 MG/ML IJ SOLN
INTRAMUSCULAR | Status: AC
  Filled 2014-02-23: qty 2

## 2014-02-23 MED ORDER — ACETAMINOPHEN 325 MG PO TABS: 650.0000 mg | ORAL_TABLET | ORAL | Status: DC | PRN

## 2014-02-23 MED ORDER — HEPARIN (PORCINE) IN NACL 2-0.9 UNIT/ML-% IJ SOLN
INTRAMUSCULAR | Status: AC
  Filled 2014-02-23: qty 1000

## 2014-02-23 MED ORDER — MIDAZOLAM HCL 2 MG/2ML IJ SOLN
INTRAMUSCULAR | Status: AC
  Filled 2014-02-23: qty 2

## 2014-02-23 SURGICAL SUPPLY — 53 items
BANDAGE ELASTIC 4 VELCRO ST LF (GAUZE/BANDAGES/DRESSINGS) IMPLANT
BANDAGE ESMARK 6X9 LF (GAUZE/BANDAGES/DRESSINGS) IMPLANT
BNDG ESMARK 6X9 LF (GAUZE/BANDAGES/DRESSINGS)
CANISTER SUCTION 2500CC (MISCELLANEOUS) ×4 IMPLANT
CLIP TI MEDIUM 24 (CLIP) ×4 IMPLANT
CLIP TI WIDE RED SMALL 24 (CLIP) ×4 IMPLANT
COVER SURGICAL LIGHT HANDLE (MISCELLANEOUS) ×4 IMPLANT
CUFF TOURNIQUET SINGLE 24IN (TOURNIQUET CUFF) IMPLANT
CUFF TOURNIQUET SINGLE 34IN LL (TOURNIQUET CUFF) IMPLANT
CUFF TOURNIQUET SINGLE 44IN (TOURNIQUET CUFF) IMPLANT
DERMABOND ADVANCED (GAUZE/BANDAGES/DRESSINGS) ×1
DERMABOND ADVANCED .7 DNX12 (GAUZE/BANDAGES/DRESSINGS) ×3 IMPLANT
DRAIN CHANNEL 15F RND FF W/TCR (WOUND CARE) IMPLANT
DRAPE WARM FLUID 44X44 (DRAPE) ×4 IMPLANT
DRAPE X-RAY CASS 24X20 (DRAPES) IMPLANT
DRSG COVADERM 4X10 (GAUZE/BANDAGES/DRESSINGS) IMPLANT
DRSG COVADERM 4X8 (GAUZE/BANDAGES/DRESSINGS) IMPLANT
ELECT REM PT RETURN 9FT ADLT (ELECTROSURGICAL) ×4
ELECTRODE REM PT RTRN 9FT ADLT (ELECTROSURGICAL) ×3 IMPLANT
EVACUATOR SILICONE 100CC (DRAIN) IMPLANT
GLOVE BIOGEL PI IND STRL 7.5 (GLOVE) ×3 IMPLANT
GLOVE BIOGEL PI INDICATOR 7.5 (GLOVE) ×1
GLOVE SURG SS PI 7.5 STRL IVOR (GLOVE) ×4 IMPLANT
GOWN PREVENTION PLUS XXLARGE (GOWN DISPOSABLE) ×4 IMPLANT
GOWN STRL NON-REIN LRG LVL3 (GOWN DISPOSABLE) ×12 IMPLANT
HEMOSTAT SNOW SURGICEL 2X4 (HEMOSTASIS) IMPLANT
KIT BASIN OR (CUSTOM PROCEDURE TRAY) ×4 IMPLANT
KIT ROOM TURNOVER OR (KITS) ×4 IMPLANT
MARKER GRAFT CORONARY BYPASS (MISCELLANEOUS) IMPLANT
NS IRRIG 1000ML POUR BTL (IV SOLUTION) ×8 IMPLANT
PACK PERIPHERAL VASCULAR (CUSTOM PROCEDURE TRAY) ×4 IMPLANT
PAD ARMBOARD 7.5X6 YLW CONV (MISCELLANEOUS) ×8 IMPLANT
PADDING CAST COTTON 6X4 STRL (CAST SUPPLIES) IMPLANT
SET COLLECT BLD 21X3/4 12 (NEEDLE) IMPLANT
STOPCOCK 4 WAY LG BORE MALE ST (IV SETS) IMPLANT
SUT ETHILON 3 0 PS 1 (SUTURE) IMPLANT
SUT PROLENE 5 0 C 1 24 (SUTURE) ×4 IMPLANT
SUT PROLENE 6 0 BV (SUTURE) ×4 IMPLANT
SUT PROLENE 7 0 BV 1 (SUTURE) IMPLANT
SUT SILK 2 0 SH (SUTURE) ×4 IMPLANT
SUT SILK 3 0 (SUTURE)
SUT SILK 3-0 18XBRD TIE 12 (SUTURE) IMPLANT
SUT VIC AB 2-0 CT1 27 (SUTURE) ×4
SUT VIC AB 2-0 CT1 TAPERPNT 27 (SUTURE) ×6 IMPLANT
SUT VIC AB 3-0 SH 27 (SUTURE) ×4
SUT VIC AB 3-0 SH 27X BRD (SUTURE) ×6 IMPLANT
SUT VICRYL 4-0 PS2 18IN ABS (SUTURE) ×8 IMPLANT
TOWEL OR 17X24 6PK STRL BLUE (TOWEL DISPOSABLE) ×8 IMPLANT
TOWEL OR 17X26 10 PK STRL BLUE (TOWEL DISPOSABLE) ×8 IMPLANT
TRAY FOLEY CATH 16FRSI W/METER (SET/KITS/TRAYS/PACK) ×4 IMPLANT
TUBING EXTENTION W/L.L. (IV SETS) IMPLANT
UNDERPAD 30X30 INCONTINENT (UNDERPADS AND DIAPERS) ×4 IMPLANT
WATER STERILE IRR 1000ML POUR (IV SOLUTION) ×4 IMPLANT

## 2014-02-23 NOTE — Telephone Encounter (Addendum)
Message copied by Doristine Section on Tue Feb 23, 2014  2:32 PM ------      Message from: Mena Goes      Created: Tue Feb 23, 2014  1:11 PM      Regarding: change in scheduled appt.        Dr. Kellie Simmering just said that he wants to see this patient on Monday June, 8th prior to his vein clinic.  He said either 8:30 or 9:00 if Kathlee Nations doesn't post a vein case in that slot.       Zigmund Daniel                  ----- Message -----         From: Conrad Waterloo, MD         Sent: 02/23/2014  11:26 AM           To: [address]            [place] HAFFEY      [id number]      Mar 18, 1946                  PROCEDURE:      1.  Right common femoral artery cannulation under ultrasound guidance      2.  Insertion of catheter in aorta      3.  Aortogram      4.  Bilateral pelvic angiogram            Follow-up: 2 weeks with Dr. Kellie Simmering       ------  left message notifying patient of post op appt. with dr. Kellie Simmering on 03-01-14 at 9 am

## 2014-02-23 NOTE — Op Note (Signed)
OPERATIVE NOTE   PROCEDURE: 1.  Right common femoral artery cannulation under ultrasound guidance 2.  Insertion of catheter in aorta 3.  Aortogram 4.  Bilateral pelvic angiogram  PRE-OPERATIVE DIAGNOSIS:  Chronic mesenteric ischemia  POST-OPERATIVE DIAGNOSIS: same as above   SURGEON: Adele Barthel, MD  ANESTHESIA: conscious sedation  ESTIMATED BLOOD LOSS: 50 cc  CONTRAST: 60 cc  FINDING(S):  On Sonosite, high femoral bifurcations bilaterally.  Aorta: patent but diseased with multiple segment of calcification Celiac artery: <30% orifical stenosis (4.1 mm/5.6 mm) collaterals to SMA visible  Superior mesenteric artery: occluded proximally, reconstituted from SMA  Inferior mesenteric artery: occluded   Right Left  RA Patent with <50% orifical stenosis Patent with <50% stenosis in mid-segment  CIA Patent but heavily calcified with diffuse diseae Patent with diffuse irregularity  EIA Patent with diffuse irregularity Patent with diffuse irregularity  IIA Patent Patent with >50% stenosis in proximal segment  CFA Patent with high bifurcation Patent with high bifurcation  SFA Patent proximally with >75% stenosis Patent  PFA Patent proximally with >75% stenosis Patent   SPECIMEN(S):  none  INDICATIONS:   Connor Bryant is a 68 y.o. male who presents with chronic mesenteric ischemia.  The patient presents for: aortogram and possible mesenteric angiogram.  I discussed with the patient the nature of angiographic procedures, especially the limited patencies of any endovascular intervention.  The patient is aware of that the risks of an angiographic procedure include but are not limited to: bleeding, infection, access site complications, renal failure, embolization, rupture of vessel, dissection, possible need for emergent surgical intervention, possible need for surgical procedures to treat the patient's pathology, and stroke and death.  The patient is aware of the risks and agrees to  proceed.  DESCRIPTION: After full informed consent was obtained from the patient, the patient was brought back to the angiography suite.  The patient was placed supine upon the angiography table and connected to monitoring equipment.  The patient was then given conscious sedation, the amounts of which are documented in the patient's chart.  The patient was prepped and drape in the standard fashion for an angiographic procedure.  At this point, attention was turned to the right groin.  Under ultrasound guidance, the right common femoral artery was cannulated with a micropuncture needle.  The microwire was advanced into the iliac arterial system.  The needle was exchanged for a microsheath, which was loaded into the common femoral artery over the wire.  The microwire was exchanged for a [facility] wire which was advanced into the aorta.  The microsheath was then exchanged for a 5-Fr sheath which was loaded into the common femoral artery.  The Omniflush catheter was then loaded over the wire up to the level of T11.  The catheter was connected to the power injector circuit.  After de-airring and de-clotting the circuit, a power injector aortogram was completed.  A lateral aortogram was also obtained.  The SMA is occluded so no endovascular intervention is recommended due to risk of distal embolization.  I then pulled the catheter down to proximal to the aortic bifurcation.  A pelvic angiogram was completed to better image the pelvis.  I replaced the wire into the catheter, straightening out the crook in the catheter.  Both were removed from the sheath together.  The sheath was aspirated.  No clots were present and the sheath was reloaded with heparinized saline.    COMPLICATIONS: none  CONDITION: stable  Adele Barthel, MD [facility] [place] Office: [phone] Pager: [phone]  02/23/2014, 11:17 AM

## 2014-02-23 NOTE — Interval H&P Note (Signed)
[facility] [place]  History and Physical Update  The patient was interviewed and re-examined.  The patient's previous History and Physical has been reviewed and is unchanged from Dr. Evelena Leyden consult. There is no change in the plan of care: aortogram, possible mesenteric angiogram.  Adele Barthel, MD [facility] Office: [phone] Pager: [phone]  02/23/2014, 10:02 AM

## 2014-02-23 NOTE — Discharge Instructions (Signed)
Arteriogram °Care After °These instructions give you information on caring for yourself after your procedure. Your doctor may also give you more specific instructions. Call your doctor if you have any problems or questions after your procedure. °HOME CARE °· Stay in bed the rest of the day. °· Keep your leg straight for at least 6 hours. °· Do not lift anything heavier than 10 pounds (about a gallon of milk) for 2 days. °· Do not walk a lot, run, or drive for 2 days. °· Return to normal activities in 2 days or as told by your doctor. °Finding out the results of your test °Ask when your test results will be ready. Make sure you get your test results. °GET HELP RIGHT AWAY IF:  °· You have fever of 102° F (38.9° C) or higher. °· You have more pain in your leg. °· The leg that was cut is: °· Bleeding. °· Puffy (swollen) or red. °· Cold. °· Pale or changes color. °· Weak. °· Tingly or numb. °If you go to the Emergency Room, tell your nurse that you have had an arteriogram. Take this paper with you to show the nurse. °MAKE SURE YOU: °· Understand these instructions. °· Will watch your condition. °· Will get help right away if you are not doing well or get worse. °Document Released: 12/07/2008 Document Revised: 12/03/2011 Document Reviewed: 12/07/2008 °ExitCare® Patient Information ©2014 [employer]. ° °

## 2014-02-23 NOTE — H&P (View-Only) (Signed)
Subjective:     Patient ID: Connor Bryant, male   DOB: 02/12/1946, 68 y.o.   MRN: [id number]  HPI this 68 year old male was referred by Dr. Carlean Purl for evaluation of possible mesenteric ischemia. Patient has been having diarrhea intermittently for the past 8 months ever since he retired in August of 2014 he states. Since that time he has lost 25 pounds while not dieting. He does not complain of postprandial pain but does have some bloating sensation in his lower abdomen. There is no blood in the diarrhea. He has no history of CVA, coronary artery disease with MI, or lower extremity claudication. He does have a heavy history of tobacco abuse 50+ pack years. He has a family history of vascular disease as well. He had a CT scan recently which revealed possible occlusion of the SMA with stenosis of the celiac axis and possible occlusion or stenosis of the left renal artery.  Past Medical History  Diagnosis Date  . Thoracic spine fracture   . HTN (hypertension)   . DM type 2 (diabetes mellitus, type 2)     no meds  . Hyperlipidemia   . Renal insufficiency     mild  . Personal history of colonic polyp-adenoma 11/24/2013    History  Substance Use Topics  . Smoking status: Former Smoker    Types: Cigarettes    Quit date: 09/24/2008  . Smokeless tobacco: Former Systems developer    Types: Chew     Comment: Pt just tried  for short time  . Alcohol Use: 4.8 oz/week    8 Cans of beer per week     Comment: 3-4 beers every night    Family History  Problem Relation Age of Onset  . Hypertension Father   . Coronary artery disease Father   . Hyperlipidemia Father   . Diabetes Father   . Bladder Cancer Father   . Cancer Father   . Heart disease Father   . Heart attack Father   . Colon cancer Neg Hx   . Throat cancer Neg Hx   . Kidney disease Neg Hx   . Liver disease Neg Hx   . Diabetes Mother   . Heart disease Mother     before age 64  . Hyperlipidemia Mother   . Hypertension Mother   . AAA (abdominal  aortic aneurysm) Mother     No Known Allergies  Current outpatient prescriptions:allopurinol (ZYLOPRIM) 100 MG tablet, Take 100 mg by mouth daily. , Disp: , Rfl: ;  aspirin 81 MG tablet, Take 81 mg by mouth daily., Disp: , Rfl: ;  atorvastatin (LIPITOR) 20 MG tablet, Take 20 mg by mouth daily., Disp: , Rfl: ;  colestipol (COLESTID) 5 G granules, Take 5 g by mouth daily. With supper, Disp: 500 g, Rfl: 1;  hydrochlorothiazide (HYDRODIURIL) 25 MG tablet, Take 25 mg by mouth daily., Disp: , Rfl:  metoprolol succinate (TOPROL-XL) 100 MG 24 hr tablet, Take 100 mg by mouth daily. Take with or immediately following a meal., Disp: , Rfl: ;  trandolapril (MAVIK) 4 MG tablet, Take 4 mg by mouth daily., Disp: , Rfl: ;  verapamil (COVERA HS) 240 MG (CO) 24 hr tablet, Take 240 mg by mouth. Take 2 capsules in the morning once a day, Disp: , Rfl:  metFORMIN (GLUMETZA) 500 MG (MOD) 24 hr tablet, Take 500 mg by mouth. Take 1 tablet in the morning and 2 tablets every evening., Disp: , Rfl:   BP 149/52  Pulse 57  Ht 5\' 8"  (1.727 m)  Wt 179 lb (81.194 kg)  BMI 27.22 kg/m2  SpO2 100%  Body mass index is 27.22 kg/(m^2).        \  Review of Systems denies chest pain, dyspnea on exertion, PND, orthopnea, hemoptysis, claudication, lateralizing weakness, aphasia, amaurosis fugax, syncope. All systems negative for complete review of systems other than GI tract    Objective:   Physical Exam BP 149/52  Pulse 57  Ht 5\' 8"  (1.727 m)  Wt 179 lb (81.194 kg)  BMI 27.22 kg/m2  SpO2 100%  Gen.-alert and oriented x3 in no apparent distress HEENT normal for age Lungs no rhonchi or wheezing Cardiovascular regular rhythm no murmurs carotid pulses 3+ palpable no bruits audible Abdomen soft nontender no palpable masses-no bruits audible Musculoskeletal free of  major deformities Skin clear -no rashes Neurologic normal Lower extremities 3+ femoral and dorsalis pedis pulses palpable bilaterally with no edema  Today  I reviewed the CT scan which was recently performed at [facility] and agree that there is heavy calcification at the origin of the SMA and celiac axis as well as a left renal artery. It is difficult to determine if these vessels are totally blocked or severely stenotic. There is some calcification in the abdominal aorta as well.        Assessment:     Possible mesenteric ischemia with possible occlusion or tight stenosis of SMA, celiac axis, with possible involvement left renal artery.    Plan:     Plan abdominal aortogram with mesenteric angiography and possible PTA and stenting of SMA and/or celiac if feasible by Dr. Trula Slade on Tuesday, June 2. Also discussed with patient that if this is not feasible he may require abdominal surgery for attempted bypass the SMA depending on findings of angiogram. He understands this and would like to proceed.

## 2014-02-26 ENCOUNTER — Encounter: Payer: Self-pay | Admitting: Vascular Surgery

## 2014-03-01 ENCOUNTER — Ambulatory Visit (INDEPENDENT_AMBULATORY_CARE_PROVIDER_SITE_OTHER): Payer: Medicare HMO | Admitting: Vascular Surgery

## 2014-03-01 ENCOUNTER — Encounter: Payer: Self-pay | Admitting: Vascular Surgery

## 2014-03-01 ENCOUNTER — Other Ambulatory Visit: Payer: Self-pay

## 2014-03-01 VITALS — BP 142/62 | HR 48 | Resp 16 | Ht 68.0 in | Wt 182.0 lb

## 2014-03-01 DIAGNOSIS — K551 Chronic vascular disorders of intestine: Secondary | ICD-10-CM | POA: Insufficient documentation

## 2014-03-01 DIAGNOSIS — I739 Peripheral vascular disease, unspecified: Secondary | ICD-10-CM

## 2014-03-01 NOTE — Progress Notes (Signed)
Subjective:     Patient ID: Connor Bryant, male   DOB: 20-Jul-1946, 68 y.o.   MRN: [id number]  HPI this 68 year old male returns for further discussion about his chronic mesenteric ischemic symptoms. He has had 25-30 pounds weight loss since August of 2014 without dieting. He also has had chronic diarrhea. He had mesenteric angiography performed by Dr. Geryl Councilman last week which revealed total occlusion of the superior mesenteric artery and inferior mesenteric artery but no significant stenosis in the celiac axis or renal arteries. He states that his diarrhea did improve for a few days but has now returned. He denies any chest pain or dyspnea on exertion. He smoked for 50 years but discontinued this 5 years ago. He has no cardiac history.  Past Medical History  Diagnosis Date  . Thoracic spine fracture   . HTN (hypertension)   . DM type 2 (diabetes mellitus, type 2)     no meds  . Hyperlipidemia   . Renal insufficiency     mild  . Personal history of colonic polyp-adenoma 11/24/2013  . Gallstone     History  Substance Use Topics  . Smoking status: Former Smoker    Types: Cigarettes    Quit date: 09/24/2008  . Smokeless tobacco: Former Systems developer    Types: Chew     Comment: Pt just tried  for short time  . Alcohol Use: 4.8 oz/week    8 Cans of beer per week     Comment: 3-4 beers every night    Family History  Problem Relation Age of Onset  . Hypertension Father   . Coronary artery disease Father   . Hyperlipidemia Father   . Diabetes Father   . Bladder Cancer Father   . Cancer Father   . Heart disease Father   . Heart attack Father   . Colon cancer Neg Hx   . Throat cancer Neg Hx   . Kidney disease Neg Hx   . Liver disease Neg Hx   . Diabetes Mother   . Heart disease Mother     before age 6  . Hyperlipidemia Mother   . Hypertension Mother   . AAA (abdominal aortic aneurysm) Mother     No Known Allergies  Current outpatient prescriptions:allopurinol (ZYLOPRIM) 100 MG tablet,  Take 100 mg by mouth daily. , Disp: , Rfl: ;  aspirin 81 MG tablet, Take 81 mg by mouth daily., Disp: , Rfl: ;  colestipol (COLESTID) 5 G granules, Take 5 g by mouth daily. With supper, Disp: 500 g, Rfl: 1;  hydrochlorothiazide (HYDRODIURIL) 25 MG tablet, Take 25 mg by mouth daily., Disp: , Rfl:  loperamide (IMODIUM) 2 MG capsule, Take by mouth as needed for diarrhea or loose stools., Disp: , Rfl: ;  metoprolol succinate (TOPROL-XL) 100 MG 24 hr tablet, Take 100 mg by mouth daily. Take with or immediately following a meal., Disp: , Rfl: ;  trandolapril (MAVIK) 4 MG tablet, Take 4 mg by mouth daily., Disp: , Rfl: ;  verapamil (COVERA HS) 240 MG (CO) 24 hr tablet, Take 480 mg by mouth daily. , Disp: , Rfl:  atorvastatin (LIPITOR) 20 MG tablet, Take 20 mg by mouth daily., Disp: , Rfl:   BP 142/62  Pulse 48  Resp 16  Ht 5\' 8"  (1.727 m)  Wt 182 lb (82.555 kg)  BMI 27.68 kg/m2  Body mass index is 27.68 kg/(m^2).           Review of Systems denies chest pain, dyspnea  on exertion, PND, orthopnea, hemoptysis. All systems is negative and a complete review of systems. No leg claudication noted.BP 142/62  Pulse 48  Resp 16  Ht 5\' 8"  (1.727 m)  Wt 182 lb (82.555 kg)  BMI 27.68 kg/m2      Objective:   Physical Exam  Gen.-alert and oriented x3 in no apparent distress HEENT normal for age Lungs no rhonchi or wheezing Cardiovascular regular rhythm no murmurs carotid pulses 3+ palpable no bruits audible Abdomen soft nontender no palpable masses Musculoskeletal free of  major deformities Skin clear -no rashes Neurologic normal Lower extremities 3+ femoral and dorsalis pedis pulses palpable bilaterally with no edema   Assessment:     Today I reviewed the mesenteric angiography and abdominal aortogram. Patient does have total occlusion of the SMA and IMA. There is diffuse calcification and the infrarenal abdominal aorta and iliac arteries despite good palpable pulses.  #1 chronic mesenteric  ischemia due to the SMA and IMA occlusion #2 diffuse calcification and aortoiliac system with palpable pulses     Plan:      discussed the situation at length with the patient and recommended proceeding with aorto to SMA bypass with possible aortobifemoral bypass if needed-this will be determined at the time of surgery Discussed mortality and morbidity of this procedure and possibility of severe diffuse mesenteric infarction without surgery He would like to proceed  Will obtain nuclear cardiac stress test later this week and proceed with surgery on Wednesday, June 17-risks and benefits thoroughly discussed

## 2014-03-02 ENCOUNTER — Other Ambulatory Visit: Payer: Self-pay | Admitting: Vascular Surgery

## 2014-03-02 ENCOUNTER — Other Ambulatory Visit: Payer: Self-pay | Admitting: *Deleted

## 2014-03-02 DIAGNOSIS — Z01818 Encounter for other preprocedural examination: Secondary | ICD-10-CM

## 2014-03-02 DIAGNOSIS — I739 Peripheral vascular disease, unspecified: Secondary | ICD-10-CM

## 2014-03-02 DIAGNOSIS — Z01810 Encounter for preprocedural cardiovascular examination: Secondary | ICD-10-CM

## 2014-03-03 ENCOUNTER — Encounter (HOSPITAL_COMMUNITY): Payer: Medicare HMO

## 2014-03-03 ENCOUNTER — Ambulatory Visit (HOSPITAL_COMMUNITY)
Admission: RE | Admit: 2014-03-03 | Discharge: 2014-03-03 | Disposition: A | Discharge status: Home / Self Care | Payer: Medicare HMO | Source: Ambulatory Visit | Attending: Vascular Surgery | Admitting: Vascular Surgery

## 2014-03-03 ENCOUNTER — Encounter (HOSPITAL_COMMUNITY): Admission: RE | Admit: 2014-03-03 | Payer: Medicare HMO | Source: Ambulatory Visit

## 2014-03-03 ENCOUNTER — Ambulatory Visit (HOSPITAL_COMMUNITY): Admission: RE | Admit: 2014-03-03 | Payer: Medicare HMO | Source: Ambulatory Visit

## 2014-03-03 ENCOUNTER — Encounter (HOSPITAL_COMMUNITY)
Admission: RE | Admit: 2014-03-03 | Discharge: 2014-03-03 | Disposition: A | Discharge status: Home / Self Care | Payer: Medicare HMO | Source: Ambulatory Visit | Attending: Vascular Surgery | Admitting: Vascular Surgery

## 2014-03-03 ENCOUNTER — Other Ambulatory Visit: Payer: Self-pay | Admitting: *Deleted

## 2014-03-03 ENCOUNTER — Encounter: Payer: Self-pay | Admitting: *Deleted

## 2014-03-03 DIAGNOSIS — I739 Peripheral vascular disease, unspecified: Secondary | ICD-10-CM

## 2014-03-03 DIAGNOSIS — Z01818 Encounter for other preprocedural examination: Secondary | ICD-10-CM

## 2014-03-03 DIAGNOSIS — Z01810 Encounter for preprocedural cardiovascular examination: Secondary | ICD-10-CM

## 2014-03-04 ENCOUNTER — Encounter (HOSPITAL_COMMUNITY)
Admission: RE | Admit: 2014-03-04 | Discharge: 2014-03-04 | Disposition: A | Discharge status: Home / Self Care | Payer: Medicare HMO | Source: Ambulatory Visit | Attending: Vascular Surgery | Admitting: Vascular Surgery

## 2014-03-04 ENCOUNTER — Encounter (HOSPITAL_COMMUNITY): Payer: Self-pay

## 2014-03-04 ENCOUNTER — Ambulatory Visit (HOSPITAL_COMMUNITY)
Admission: RE | Admit: 2014-03-04 | Discharge: 2014-03-04 | Disposition: A | Discharge status: Home / Self Care | Payer: Medicare HMO | Source: Ambulatory Visit | Attending: Anesthesiology | Admitting: Anesthesiology

## 2014-03-04 ENCOUNTER — Ambulatory Visit (HOSPITAL_COMMUNITY)
Admission: RE | Admit: 2014-03-04 | Discharge: 2014-03-04 | Disposition: A | Discharge status: Home / Self Care | Payer: Medicare HMO | Source: Ambulatory Visit | Attending: Vascular Surgery | Admitting: Vascular Surgery

## 2014-03-04 DIAGNOSIS — I10 Essential (primary) hypertension: Secondary | ICD-10-CM | POA: Insufficient documentation

## 2014-03-04 DIAGNOSIS — E119 Type 2 diabetes mellitus without complications: Secondary | ICD-10-CM | POA: Insufficient documentation

## 2014-03-04 DIAGNOSIS — Z01812 Encounter for preprocedural laboratory examination: Secondary | ICD-10-CM | POA: Insufficient documentation

## 2014-03-04 DIAGNOSIS — R0602 Shortness of breath: Secondary | ICD-10-CM | POA: Insufficient documentation

## 2014-03-04 DIAGNOSIS — Z01810 Encounter for preprocedural cardiovascular examination: Secondary | ICD-10-CM | POA: Insufficient documentation

## 2014-03-04 DIAGNOSIS — K55059 Acute (reversible) ischemia of intestine, part and extent unspecified: Secondary | ICD-10-CM | POA: Insufficient documentation

## 2014-03-04 DIAGNOSIS — K551 Chronic vascular disorders of intestine: Secondary | ICD-10-CM | POA: Insufficient documentation

## 2014-03-04 DIAGNOSIS — R079 Chest pain, unspecified: Secondary | ICD-10-CM

## 2014-03-04 DIAGNOSIS — Z01818 Encounter for other preprocedural examination: Secondary | ICD-10-CM | POA: Insufficient documentation

## 2014-03-04 HISTORY — DX: Unspecified osteoarthritis, unspecified site: M19.90

## 2014-03-04 HISTORY — DX: Cardiac murmur, unspecified: R01.1

## 2014-03-04 LAB — BLOOD GAS, ARTERIAL
Acid-Base Excess: 2.7 mmol/L — ABNORMAL HIGH (ref 0.0–2.0)
Bicarbonate: 26.4 mEq/L — ABNORMAL HIGH (ref 20.0–24.0)
Drawn by: 344381
FIO2: 0.21 %
O2 Saturation: 97 %
PH ART: 7.453 — AB (ref 7.350–7.450)
Patient temperature: 98.6
TCO2: 27.5 mmol/L (ref 0–100)
pCO2 arterial: 38.2 mmHg (ref 35.0–45.0)
pO2, Arterial: 85.6 mmHg (ref 80.0–100.0)

## 2014-03-04 LAB — CBC
HCT: 35.6 % — ABNORMAL LOW (ref 39.0–52.0)
Hemoglobin: 11.4 g/dL — ABNORMAL LOW (ref 13.0–17.0)
MCH: 27.9 pg (ref 26.0–34.0)
MCHC: 32 g/dL (ref 30.0–36.0)
MCV: 87.3 fL (ref 78.0–100.0)
Platelets: 210 10*3/uL (ref 150–400)
RBC: 4.08 MIL/uL — ABNORMAL LOW (ref 4.22–5.81)
RDW: 14.1 % (ref 11.5–15.5)
WBC: 9 10*3/uL (ref 4.0–10.5)

## 2014-03-04 LAB — COMPREHENSIVE METABOLIC PANEL
ALK PHOS: 107 U/L (ref 39–117)
ALT: 13 U/L (ref 0–53)
AST: 15 U/L (ref 0–37)
Albumin: 3.6 g/dL (ref 3.5–5.2)
BUN: 13 mg/dL (ref 6–23)
CALCIUM: 9.2 mg/dL (ref 8.4–10.5)
CO2: 23 mEq/L (ref 19–32)
Chloride: 105 mEq/L (ref 96–112)
Creatinine, Ser: 0.84 mg/dL (ref 0.50–1.35)
GFR calc non Af Amer: 87 mL/min — ABNORMAL LOW (ref >90)
Glucose, Bld: 120 mg/dL — ABNORMAL HIGH (ref 70–99)
POTASSIUM: 3.7 meq/L (ref 3.7–5.3)
SODIUM: 143 meq/L (ref 137–147)
TOTAL PROTEIN: 7.2 g/dL (ref 6.0–8.3)
Total Bilirubin: 0.4 mg/dL (ref 0.3–1.2)

## 2014-03-04 LAB — URINALYSIS, ROUTINE W REFLEX MICROSCOPIC
BILIRUBIN URINE: NEGATIVE
GLUCOSE, UA: NEGATIVE mg/dL
Hgb urine dipstick: NEGATIVE
KETONES UR: NEGATIVE mg/dL
Leukocytes, UA: NEGATIVE
Nitrite: NEGATIVE
PH: 7 (ref 5.0–8.0)
Protein, ur: NEGATIVE mg/dL
Specific Gravity, Urine: 1.01 (ref 1.005–1.030)
Urobilinogen, UA: 1 mg/dL (ref 0.0–1.0)

## 2014-03-04 LAB — ABO/RH: ABO/RH(D): O POS

## 2014-03-04 LAB — PROTIME-INR
INR: 1.01 (ref 0.00–1.49)
PROTHROMBIN TIME: 13.1 s (ref 11.6–15.2)

## 2014-03-04 LAB — SURGICAL PCR SCREEN
MRSA, PCR: NEGATIVE
Staphylococcus aureus: NEGATIVE

## 2014-03-04 LAB — APTT: APTT: 29 s (ref 24–37)

## 2014-03-04 MED ORDER — SODIUM CHLORIDE 0.9 % IJ SOLN
INTRAMUSCULAR | Status: AC
  Administered 2014-03-04: 10 mL via INTRAVENOUS
  Filled 2014-03-04: qty 10

## 2014-03-04 MED ORDER — TECHNETIUM TC 99M SESTAMIBI GENERIC - CARDIOLITE
30.0000 | Freq: Once | INTRAVENOUS | Status: AC | PRN
  Administered 2014-03-04: 30 via INTRAVENOUS

## 2014-03-04 MED ORDER — TECHNETIUM TC 99M SESTAMIBI - CARDIOLITE
10.0000 | Freq: Once | INTRAVENOUS | Status: AC | PRN
  Administered 2014-03-04: 10 via INTRAVENOUS

## 2014-03-04 MED ORDER — REGADENOSON 0.4 MG/5ML IV SOLN
INTRAVENOUS | Status: AC
  Administered 2014-03-04: 0.4 mg via INTRAVENOUS
  Filled 2014-03-04: qty 5

## 2014-03-04 NOTE — Progress Notes (Signed)
Stress Lab Nurses Notes - Connor Bryant  Connor Bryant 03/04/2014 Reason for doing test: Surgical Clearance Type of test: Wille Glaser Nurse performing test: Gerrit Halls, RN Nuclear Medicine Tech: Redmond Baseman Echo Tech: Not Applicable MD performing test: S. McDowell/K.LawrenceNP Family MD: Bulter Test explained and consent signed: yes IV started: 22g jelco, Saline lock flushed, No redness or edema and Saline lock started in radiology Symptoms: SOB Treatment/Intervention: None Reason test stopped: protocol completed After recovery IV was: Discontinued via X-ray tech and No redness or edema Patient to return to [place]. Med at : 10:00 Patient discharged: Home Patient's Condition upon discharge was: stable Comments: During test BP 172/65 & HR 61.  Recovery BP 137/64 & HR 50.  Symptoms resolved in recovery. Geanie Cooley T

## 2014-03-04 NOTE — Progress Notes (Signed)
03/04/14 1341  OBSTRUCTIVE SLEEP APNEA  Score 4 or greater  Results sent to PCP

## 2014-03-04 NOTE — Pre-Procedure Instructions (Signed)
Connor Bryant  03/04/2014   Your procedure is scheduled on:  March 10 829  Report to [place] at 0630 AM.  Call this number if you have problems the morning of surgery: [phone]   Remember:   Do not eat food or drink liquids after midnight.   Take these medicines the morning of surgery with A SIP OF WATER: Allopurinol (zyloprim), Metoprolol succinate (Toprol-XL), Verapamil (covera HS)   Do not wear jewelry, make-up or nail polish.  Do not wear lotions, powders, or perfumes. You may wear deodorant.  Do not shave 48 hours prior to surgery. Men may shave face and neck.  Do not bring valuables to the hospital.  [facility] is not responsible                  for any belongings or valuables.               Contacts, dentures or bridgework may not be worn into surgery.  Leave suitcase in the car. After surgery it may be brought to your room.  For patients admitted to the hospital, discharge time is determined by your                treatment team.               Patients discharged the day of surgery will not be allowed to drive  home.    Special Instructions: [place] - Preparing for Surgery  Before surgery, you can play an important role.  Because skin is not sterile, your skin needs to be as free of germs as possible.  You can reduce the number of germs on you skin by washing with CHG (chlorahexidine gluconate) soap before surgery.  CHG is an antiseptic cleaner which kills germs and bonds with the skin to continue killing germs even after washing.  Please DO NOT use if you have an allergy to CHG or antibacterial soaps.  If your skin becomes reddened/irritated stop using the CHG and inform your nurse when you arrive at Short Stay.  Do not shave (including legs and underarms) for at least 48 hours prior to the first CHG shower.  You may shave your face.  Please follow these instructions carefully:   1.  Shower with CHG Soap the night before surgery and the                                 morning of Surgery.  2.  If you choose to wash your hair, wash your hair first as usual with your  normal shampoo.  3.  After you shampoo, rinse your hair and body thoroughly to remove the   Shampoo.  4.  Use CHG as you would any other liquid soap.  You can apply chg directly  to the skin and wash gently with scrungie or a clean washcloth.  5.  Apply the CHG Soap to your body ONLY FROM THE NECK DOWN.        Do not use on open wounds or open sores.  Avoid contact with your eyes,       ears, mouth and genitals (private parts).  Wash genitals (private parts)       with your normal soap.  6.  Wash thoroughly, paying special attention to the area where your surgery        will be performed.  7.  Thoroughly rinse your body with warm water from the neck down.  8.  DO NOT shower/wash with your normal soap after using and rinsing off  the CHG Soap.  9.  Pat yourself dry with a clean towel.            10.  Wear clean pajamas.            11.  Place clean sheets on your bed the night of your first shower and do not  sleep with pets.  Day of Surgery  Do not apply any lotions/deoderants the morning of surgery.  Please wear clean clothes to the hospital/surgery center.      Please read over the following fact sheets that you were given: Pain Booklet, Coughing and Deep Breathing, Blood Transfusion Information, MRSA Information and Surgical Site Infection Prevention

## 2014-03-04 NOTE — Progress Notes (Signed)
PCP is Terald Sleeper, PA-C Denies seeing a cardiologist Denies having an echo. Stress test was done this morning at [facility]. Results not yet noted.

## 2014-03-08 ENCOUNTER — Encounter: Payer: Medicare HMO | Admitting: Internal Medicine

## 2014-03-09 MED ORDER — DEXTROSE 5 % IV SOLN: 1.5000 g | INTRAVENOUS | Status: DC

## 2014-03-10 ENCOUNTER — Inpatient Hospital Stay (HOSPITAL_COMMUNITY): Payer: Medicare HMO

## 2014-03-10 ENCOUNTER — Encounter (HOSPITAL_COMMUNITY): Payer: Self-pay | Admitting: Certified Registered Nurse Anesthetist

## 2014-03-10 ENCOUNTER — Inpatient Hospital Stay (HOSPITAL_COMMUNITY): Payer: Medicare HMO | Admitting: Certified Registered Nurse Anesthetist

## 2014-03-10 ENCOUNTER — Encounter (HOSPITAL_COMMUNITY)
Admission: RE | Disposition: A | Discharge status: Home / Self Care | Payer: Medicare HMO | Source: Ambulatory Visit | Attending: Vascular Surgery

## 2014-03-10 ENCOUNTER — Encounter (HOSPITAL_COMMUNITY): Payer: Medicare HMO | Admitting: Certified Registered Nurse Anesthetist

## 2014-03-10 ENCOUNTER — Inpatient Hospital Stay (HOSPITAL_COMMUNITY)
Admission: RE | Admit: 2014-03-10 | Discharge: 2014-03-17 | DRG: 357 | Disposition: A | Discharge status: Home / Self Care | Payer: Medicare HMO | Source: Ambulatory Visit | Attending: Vascular Surgery | Admitting: Vascular Surgery

## 2014-03-10 DIAGNOSIS — I10 Essential (primary) hypertension: Secondary | ICD-10-CM | POA: Diagnosis present

## 2014-03-10 DIAGNOSIS — Z8052 Family history of malignant neoplasm of bladder: Secondary | ICD-10-CM

## 2014-03-10 DIAGNOSIS — I7092 Chronic total occlusion of artery of the extremities: Secondary | ICD-10-CM | POA: Diagnosis present

## 2014-03-10 DIAGNOSIS — K551 Chronic vascular disorders of intestine: Principal | ICD-10-CM | POA: Diagnosis present

## 2014-03-10 DIAGNOSIS — D62 Acute posthemorrhagic anemia: Secondary | ICD-10-CM | POA: Diagnosis not present

## 2014-03-10 DIAGNOSIS — E119 Type 2 diabetes mellitus without complications: Secondary | ICD-10-CM | POA: Diagnosis present

## 2014-03-10 DIAGNOSIS — R339 Retention of urine, unspecified: Secondary | ICD-10-CM | POA: Diagnosis not present

## 2014-03-10 DIAGNOSIS — E785 Hyperlipidemia, unspecified: Secondary | ICD-10-CM | POA: Diagnosis present

## 2014-03-10 DIAGNOSIS — R197 Diarrhea, unspecified: Secondary | ICD-10-CM | POA: Diagnosis present

## 2014-03-10 DIAGNOSIS — Z860100 Personal history of colon polyps, unspecified: Secondary | ICD-10-CM

## 2014-03-10 DIAGNOSIS — Z87891 Personal history of nicotine dependence: Secondary | ICD-10-CM

## 2014-03-10 DIAGNOSIS — D500 Iron deficiency anemia secondary to blood loss (chronic): Secondary | ICD-10-CM | POA: Diagnosis not present

## 2014-03-10 DIAGNOSIS — I771 Stricture of artery: Secondary | ICD-10-CM

## 2014-03-10 DIAGNOSIS — Z23 Encounter for immunization: Secondary | ICD-10-CM

## 2014-03-10 DIAGNOSIS — K559 Vascular disorder of intestine, unspecified: Secondary | ICD-10-CM | POA: Diagnosis present

## 2014-03-10 DIAGNOSIS — R634 Abnormal weight loss: Secondary | ICD-10-CM | POA: Diagnosis present

## 2014-03-10 DIAGNOSIS — Z8249 Family history of ischemic heart disease and other diseases of the circulatory system: Secondary | ICD-10-CM

## 2014-03-10 DIAGNOSIS — Z833 Family history of diabetes mellitus: Secondary | ICD-10-CM

## 2014-03-10 HISTORY — PX: MESENTERIC ARTERY BYPASS: SHX5968

## 2014-03-10 LAB — BLOOD GAS, ARTERIAL
Acid-Base Excess: 1.3 mmol/L (ref 0.0–2.0)
Bicarbonate: 25.8 mEq/L — ABNORMAL HIGH (ref 20.0–24.0)
O2 CONTENT: 2 L/min
O2 SAT: 92.6 %
PCO2 ART: 43 mmHg (ref 35.0–45.0)
Patient temperature: 98.1
TCO2: 27.1 mmol/L (ref 0–100)
pH, Arterial: 7.393 (ref 7.350–7.450)
pO2, Arterial: 61.9 mmHg — ABNORMAL LOW (ref 80.0–100.0)

## 2014-03-10 LAB — POCT I-STAT 7, (LYTES, BLD GAS, ICA,H+H)
BICARBONATE: 26 meq/L — AB (ref 20.0–24.0)
Calcium, Ion: 1.15 mmol/L (ref 1.13–1.30)
HCT: 25 % — ABNORMAL LOW (ref 39.0–52.0)
HEMOGLOBIN: 8.5 g/dL — AB (ref 13.0–17.0)
O2 Saturation: 99 %
PCO2 ART: 44.4 mmHg (ref 35.0–45.0)
PH ART: 7.372 (ref 7.350–7.450)
Potassium: 3.6 mEq/L — ABNORMAL LOW (ref 3.7–5.3)
Sodium: 139 mEq/L (ref 137–147)
TCO2: 27 mmol/L (ref 0–100)
pO2, Arterial: 145 mmHg — ABNORMAL HIGH (ref 80.0–100.0)

## 2014-03-10 LAB — GLUCOSE, CAPILLARY
GLUCOSE-CAPILLARY: 151 mg/dL — AB (ref 70–99)
Glucose-Capillary: 161 mg/dL — ABNORMAL HIGH (ref 70–99)
Glucose-Capillary: 208 mg/dL — ABNORMAL HIGH (ref 70–99)

## 2014-03-10 LAB — CBC
HEMATOCRIT: 28.9 % — AB (ref 39.0–52.0)
Hemoglobin: 9.4 g/dL — ABNORMAL LOW (ref 13.0–17.0)
MCH: 28.5 pg (ref 26.0–34.0)
MCHC: 32.5 g/dL (ref 30.0–36.0)
MCV: 87.6 fL (ref 78.0–100.0)
Platelets: 162 10*3/uL (ref 150–400)
RBC: 3.3 MIL/uL — AB (ref 4.22–5.81)
RDW: 14.3 % (ref 11.5–15.5)
WBC: 9.8 10*3/uL (ref 4.0–10.5)

## 2014-03-10 LAB — APTT: APTT: 27 s (ref 24–37)

## 2014-03-10 LAB — POCT I-STAT 4, (NA,K, GLUC, HGB,HCT)
GLUCOSE: 168 mg/dL — AB (ref 70–99)
HEMATOCRIT: 26 % — AB (ref 39.0–52.0)
Hemoglobin: 8.8 g/dL — ABNORMAL LOW (ref 13.0–17.0)
POTASSIUM: 3.7 meq/L (ref 3.7–5.3)
Sodium: 140 mEq/L (ref 137–147)

## 2014-03-10 LAB — BASIC METABOLIC PANEL
BUN: 13 mg/dL (ref 6–23)
CHLORIDE: 105 meq/L (ref 96–112)
CO2: 25 meq/L (ref 19–32)
Calcium: 8.4 mg/dL (ref 8.4–10.5)
Creatinine, Ser: 0.78 mg/dL (ref 0.50–1.35)
GFR calc Af Amer: >90 mL/min (ref >90)
GFR calc non Af Amer: >90 mL/min (ref >90)
Glucose, Bld: 171 mg/dL — ABNORMAL HIGH (ref 70–99)
Potassium: 4 mEq/L (ref 3.7–5.3)
Sodium: 142 mEq/L (ref 137–147)

## 2014-03-10 LAB — MAGNESIUM: Magnesium: 1.8 mg/dL (ref 1.5–2.5)

## 2014-03-10 LAB — PREPARE RBC (CROSSMATCH)

## 2014-03-10 LAB — PROTIME-INR
INR: 1.11 (ref 0.00–1.49)
PROTHROMBIN TIME: 14.1 s (ref 11.6–15.2)

## 2014-03-10 SURGERY — CREATION, BYPASS, ARTERIAL, MESENTERIC
Anesthesia: General | Site: Abdomen

## 2014-03-10 MED ORDER — INSULIN ASPART 100 UNIT/ML ~~LOC~~ SOLN
0.0000 [IU] | SUBCUTANEOUS | Status: DC
  Administered 2014-03-10: 5 [IU] via SUBCUTANEOUS
  Administered 2014-03-11: 2 [IU] via SUBCUTANEOUS
  Administered 2014-03-11 (×2): 3 [IU] via SUBCUTANEOUS
  Administered 2014-03-11: 2 [IU] via SUBCUTANEOUS
  Administered 2014-03-11: 3 [IU] via SUBCUTANEOUS
  Administered 2014-03-11: 2 [IU] via SUBCUTANEOUS
  Administered 2014-03-12: 3 [IU] via SUBCUTANEOUS
  Administered 2014-03-12 (×3): 2 [IU] via SUBCUTANEOUS
  Administered 2014-03-12 – 2014-03-13 (×3): 3 [IU] via SUBCUTANEOUS
  Administered 2014-03-13 (×3): 2 [IU] via SUBCUTANEOUS
  Administered 2014-03-13: 3 [IU] via SUBCUTANEOUS
  Administered 2014-03-14 (×2): 2 [IU] via SUBCUTANEOUS

## 2014-03-10 MED ORDER — ACETAMINOPHEN 650 MG RE SUPP: 325.0000 mg | RECTAL | Status: DC | PRN

## 2014-03-10 MED ORDER — MORPHINE SULFATE 2 MG/ML IJ SOLN
INTRAMUSCULAR | Status: AC
  Administered 2014-03-10: 2 mg via INTRAVENOUS
  Filled 2014-03-10: qty 1

## 2014-03-10 MED ORDER — SODIUM CHLORIDE 0.9 % IJ SOLN
INTRAMUSCULAR | Status: AC
  Filled 2014-03-10: qty 20

## 2014-03-10 MED ORDER — GUAIFENESIN-DM 100-10 MG/5ML PO SYRP: 15.0000 mL | ORAL_SOLUTION | ORAL | Status: DC | PRN

## 2014-03-10 MED ORDER — HYDROMORPHONE HCL PF 1 MG/ML IJ SOLN
0.5000 mg | INTRAMUSCULAR | Status: DC | PRN
  Administered 2014-03-10 – 2014-03-11 (×3): 1 mg via INTRAVENOUS
  Administered 2014-03-11: 2 mg via INTRAVENOUS
  Administered 2014-03-11 (×3): 1 mg via INTRAVENOUS
  Administered 2014-03-11: 2 mg via INTRAVENOUS
  Administered 2014-03-11 (×6): 1 mg via INTRAVENOUS
  Administered 2014-03-11 – 2014-03-12 (×2): 2 mg via INTRAVENOUS
  Administered 2014-03-12: 1 mg via INTRAVENOUS
  Administered 2014-03-12: 2 mg via INTRAVENOUS
  Administered 2014-03-12: 1 mg via INTRAVENOUS
  Administered 2014-03-12: 2 mg via INTRAVENOUS
  Administered 2014-03-12: 1 mg via INTRAVENOUS
  Administered 2014-03-12 – 2014-03-15 (×20): 2 mg via INTRAVENOUS
  Filled 2014-03-10 (×2): qty 1
  Filled 2014-03-10 (×2): qty 2
  Filled 2014-03-10 (×4): qty 1
  Filled 2014-03-10 (×4): qty 2
  Filled 2014-03-10: qty 1
  Filled 2014-03-10 (×2): qty 2
  Filled 2014-03-10: qty 1
  Filled 2014-03-10 (×4): qty 2
  Filled 2014-03-10: qty 1
  Filled 2014-03-10 (×4): qty 2
  Filled 2014-03-10: qty 1
  Filled 2014-03-10 (×3): qty 2
  Filled 2014-03-10: qty 1
  Filled 2014-03-10 (×3): qty 2
  Filled 2014-03-10: qty 1
  Filled 2014-03-10: qty 2
  Filled 2014-03-10: qty 1
  Filled 2014-03-10: qty 2
  Filled 2014-03-10: qty 1
  Filled 2014-03-10 (×3): qty 2
  Filled 2014-03-10: qty 1

## 2014-03-10 MED ORDER — EPHEDRINE SULFATE 50 MG/ML IJ SOLN
INTRAMUSCULAR | Status: AC
  Filled 2014-03-10: qty 1

## 2014-03-10 MED ORDER — OXYCODONE HCL 5 MG PO TABS
5.0000 mg | ORAL_TABLET | Freq: Once | ORAL | Status: DC | PRN
Start: 2014-03-10 — End: 2014-03-10

## 2014-03-10 MED ORDER — ONDANSETRON HCL 4 MG/2ML IJ SOLN
INTRAMUSCULAR | Status: AC
  Filled 2014-03-10: qty 2

## 2014-03-10 MED ORDER — ALBUMIN HUMAN 5 % IV SOLN
INTRAVENOUS | Status: DC | PRN
  Administered 2014-03-10: 10:00:00 via INTRAVENOUS

## 2014-03-10 MED ORDER — ALUM & MAG HYDROXIDE-SIMETH 200-200-20 MG/5ML PO SUSP: 15.0000 mL | ORAL | Status: DC | PRN

## 2014-03-10 MED ORDER — MAGNESIUM SULFATE 40 MG/ML IJ SOLN
2.0000 g | Freq: Every day | INTRAMUSCULAR | Status: DC | PRN
  Filled 2014-03-10: qty 50

## 2014-03-10 MED ORDER — GLYCOPYRROLATE 0.2 MG/ML IJ SOLN
INTRAMUSCULAR | Status: DC | PRN
  Administered 2014-03-10: .8 mg via INTRAVENOUS
  Administered 2014-03-10: 0.2 mg via INTRAVENOUS

## 2014-03-10 MED ORDER — FENTANYL CITRATE 0.05 MG/ML IJ SOLN
INTRAMUSCULAR | Status: DC | PRN
  Administered 2014-03-10 (×3): 50 ug via INTRAVENOUS
  Administered 2014-03-10: 100 ug via INTRAVENOUS
  Administered 2014-03-10: 50 ug via INTRAVENOUS
  Administered 2014-03-10: 100 ug via INTRAVENOUS

## 2014-03-10 MED ORDER — LABETALOL HCL 5 MG/ML IV SOLN
10.0000 mg | INTRAVENOUS | Status: DC | PRN
  Administered 2014-03-11 – 2014-03-17 (×9): 10 mg via INTRAVENOUS
  Filled 2014-03-10 (×6): qty 4

## 2014-03-10 MED ORDER — METOPROLOL TARTRATE 1 MG/ML IV SOLN: 2.0000 mg | INTRAVENOUS | Status: DC | PRN

## 2014-03-10 MED ORDER — LIDOCAINE HCL (CARDIAC) 20 MG/ML IV SOLN
INTRAVENOUS | Status: AC
  Filled 2014-03-10: qty 5

## 2014-03-10 MED ORDER — CHLORHEXIDINE GLUCONATE 0.12 % MT SOLN
15.0000 mL | Freq: Two times a day (BID) | OROMUCOSAL | Status: DC
  Administered 2014-03-10 – 2014-03-17 (×13): 15 mL via OROMUCOSAL
  Filled 2014-03-10 (×16): qty 15

## 2014-03-10 MED ORDER — DOPAMINE-DEXTROSE 3.2-5 MG/ML-% IV SOLN: 3.0000 ug/kg/min | INTRAVENOUS | Status: DC

## 2014-03-10 MED ORDER — HYDROMORPHONE HCL PF 1 MG/ML IJ SOLN
0.2500 mg | INTRAMUSCULAR | Status: DC | PRN
  Administered 2014-03-10 (×3): 0.25 mg via INTRAVENOUS

## 2014-03-10 MED ORDER — LACTATED RINGERS IV SOLN
INTRAVENOUS | Status: DC | PRN
  Administered 2014-03-10: 08:00:00 via INTRAVENOUS

## 2014-03-10 MED ORDER — KCL IN DEXTROSE-NACL 20-5-0.45 MEQ/L-%-% IV SOLN
INTRAVENOUS | Status: AC
  Filled 2014-03-10: qty 1000

## 2014-03-10 MED ORDER — MORPHINE SULFATE 4 MG/ML IJ SOLN
4.0000 mg | Freq: Once | INTRAMUSCULAR | Status: AC
  Administered 2014-03-10: 4 mg via INTRAVENOUS
  Filled 2014-03-10: qty 1

## 2014-03-10 MED ORDER — SODIUM CHLORIDE 0.9 % IR SOLN
Status: DC | PRN
  Administered 2014-03-10: 08:00:00

## 2014-03-10 MED ORDER — HEMOSTATIC AGENTS (NO CHARGE) OPTIME
TOPICAL | Status: DC | PRN
  Administered 2014-03-10: 1 via TOPICAL

## 2014-03-10 MED ORDER — ONDANSETRON HCL 4 MG/2ML IJ SOLN
INTRAMUSCULAR | Status: DC | PRN
Start: 2014-03-10 — End: 2014-03-10
  Administered 2014-03-10: 4 mg via INTRAVENOUS

## 2014-03-10 MED ORDER — ACETAMINOPHEN 325 MG PO TABS
325.0000 mg | ORAL_TABLET | ORAL | Status: DC | PRN
  Administered 2014-03-16: 650 mg via ORAL
  Filled 2014-03-10: qty 2

## 2014-03-10 MED ORDER — ALBUMIN HUMAN 5 % IV SOLN
INTRAVENOUS | Status: DC | PRN
  Administered 2014-03-10 (×2): via INTRAVENOUS

## 2014-03-10 MED ORDER — EPHEDRINE SULFATE 50 MG/ML IJ SOLN
INTRAMUSCULAR | Status: DC | PRN
  Administered 2014-03-10: 5 mg via INTRAVENOUS

## 2014-03-10 MED ORDER — HEPARIN SODIUM (PORCINE) 1000 UNIT/ML IJ SOLN
INTRAMUSCULAR | Status: DC | PRN
  Administered 2014-03-10: 6 mL via INTRAVENOUS

## 2014-03-10 MED ORDER — SODIUM CHLORIDE 0.9 % IV SOLN: INTRAVENOUS | Status: DC

## 2014-03-10 MED ORDER — LACTATED RINGERS IV SOLN
INTRAVENOUS | Status: DC | PRN
  Administered 2014-03-10 (×2): via INTRAVENOUS

## 2014-03-10 MED ORDER — PHENYLEPHRINE 40 MCG/ML (10ML) SYRINGE FOR IV PUSH (FOR BLOOD PRESSURE SUPPORT)
PREFILLED_SYRINGE | INTRAVENOUS | Status: AC
  Filled 2014-03-10: qty 10

## 2014-03-10 MED ORDER — MIDAZOLAM HCL 5 MG/5ML IJ SOLN
INTRAMUSCULAR | Status: DC | PRN
Start: 2014-03-10 — End: 2014-03-10
  Administered 2014-03-10: 2 mg via INTRAVENOUS

## 2014-03-10 MED ORDER — SUCCINYLCHOLINE CHLORIDE 20 MG/ML IJ SOLN
INTRAMUSCULAR | Status: AC
  Filled 2014-03-10: qty 1

## 2014-03-10 MED ORDER — SODIUM CHLORIDE 0.9 % IR SOLN
Status: DC | PRN
  Administered 2014-03-10: 3000 mL

## 2014-03-10 MED ORDER — PHENOL 1.4 % MT LIQD: 1.0000 | OROMUCOSAL | Status: DC | PRN

## 2014-03-10 MED ORDER — CHLORHEXIDINE GLUCONATE CLOTH 2 % EX PADS: 6.0000 | MEDICATED_PAD | Freq: Once | CUTANEOUS | Status: DC

## 2014-03-10 MED ORDER — MORPHINE SULFATE 2 MG/ML IJ SOLN
2.0000 mg | INTRAMUSCULAR | Status: DC | PRN
  Administered 2014-03-10 (×3): 2 mg via INTRAVENOUS
  Administered 2014-03-14 – 2014-03-15 (×3): 4 mg via INTRAVENOUS
  Filled 2014-03-10: qty 1
  Filled 2014-03-10 (×3): qty 2

## 2014-03-10 MED ORDER — DOCUSATE SODIUM 100 MG PO CAPS
100.0000 mg | ORAL_CAPSULE | Freq: Every day | ORAL | Status: DC
  Administered 2014-03-13 – 2014-03-17 (×5): 100 mg via ORAL
  Filled 2014-03-10 (×6): qty 1

## 2014-03-10 MED ORDER — FENTANYL CITRATE 0.05 MG/ML IJ SOLN
INTRAMUSCULAR | Status: AC
  Filled 2014-03-10: qty 5

## 2014-03-10 MED ORDER — PROTAMINE SULFATE 10 MG/ML IV SOLN
INTRAVENOUS | Status: DC | PRN
  Administered 2014-03-10 (×2): 10 mg via INTRAVENOUS
  Administered 2014-03-10: 20 mg via INTRAVENOUS
  Administered 2014-03-10: 10 mg via INTRAVENOUS

## 2014-03-10 MED ORDER — POTASSIUM CHLORIDE CRYS ER 20 MEQ PO TBCR: 20.0000 meq | EXTENDED_RELEASE_TABLET | Freq: Every day | ORAL | Status: DC | PRN

## 2014-03-10 MED ORDER — DEXTROSE 5 % IV SOLN
INTRAVENOUS | Status: AC
  Filled 2014-03-10: qty 1.5

## 2014-03-10 MED ORDER — MANNITOL 25 % IV SOLN
INTRAVENOUS | Status: DC | PRN
  Administered 2014-03-10: 25 g via INTRAVENOUS

## 2014-03-10 MED ORDER — MIDAZOLAM HCL 2 MG/2ML IJ SOLN
INTRAMUSCULAR | Status: AC
  Filled 2014-03-10: qty 2

## 2014-03-10 MED ORDER — BIOTENE DRY MOUTH MT LIQD
15.0000 mL | Freq: Two times a day (BID) | OROMUCOSAL | Status: DC
  Administered 2014-03-10 – 2014-03-16 (×7): 15 mL via OROMUCOSAL

## 2014-03-10 MED ORDER — BISACODYL 10 MG RE SUPP: 10.0000 mg | Freq: Every day | RECTAL | Status: DC | PRN

## 2014-03-10 MED ORDER — SODIUM CHLORIDE 0.9 % IV SOLN: 500.0000 mL | Freq: Once | INTRAVENOUS | Status: AC | PRN

## 2014-03-10 MED ORDER — HYDROMORPHONE HCL PF 1 MG/ML IJ SOLN
INTRAMUSCULAR | Status: AC
  Filled 2014-03-10: qty 1

## 2014-03-10 MED ORDER — ROCURONIUM BROMIDE 100 MG/10ML IV SOLN
INTRAVENOUS | Status: DC | PRN
  Administered 2014-03-10 (×2): 10 mg via INTRAVENOUS
  Administered 2014-03-10: 50 mg via INTRAVENOUS

## 2014-03-10 MED ORDER — METOCLOPRAMIDE HCL 5 MG/ML IJ SOLN: 10.0000 mg | Freq: Once | INTRAMUSCULAR | Status: DC | PRN

## 2014-03-10 MED ORDER — SODIUM CHLORIDE 0.9 % IJ SOLN
INTRAMUSCULAR | Status: AC
  Filled 2014-03-10: qty 10

## 2014-03-10 MED ORDER — DEXTROSE 5 % IV SOLN
10.0000 mg | INTRAVENOUS | Status: DC | PRN
  Administered 2014-03-10: 40 ug/min via INTRAVENOUS

## 2014-03-10 MED ORDER — MORPHINE SULFATE 2 MG/ML IJ SOLN
1.0000 mg | INTRAMUSCULAR | Status: DC | PRN
  Administered 2014-03-10: 2 mg via INTRAVENOUS
  Filled 2014-03-10: qty 1

## 2014-03-10 MED ORDER — OXYCODONE HCL 5 MG/5ML PO SOLN: 5.0000 mg | Freq: Once | ORAL | Status: DC | PRN

## 2014-03-10 MED ORDER — KCL IN DEXTROSE-NACL 20-5-0.45 MEQ/L-%-% IV SOLN
INTRAVENOUS | Status: DC
  Administered 2014-03-10: 100 mL/h via INTRAVENOUS
  Administered 2014-03-11 – 2014-03-15 (×8): via INTRAVENOUS
  Filled 2014-03-10 (×14): qty 1000

## 2014-03-10 MED ORDER — ROCURONIUM BROMIDE 50 MG/5ML IV SOLN
INTRAVENOUS | Status: AC
  Filled 2014-03-10: qty 1

## 2014-03-10 MED ORDER — NEOSTIGMINE METHYLSULFATE 10 MG/10ML IV SOLN
INTRAVENOUS | Status: AC
  Filled 2014-03-10: qty 2

## 2014-03-10 MED ORDER — HYDRALAZINE HCL 20 MG/ML IJ SOLN
10.0000 mg | INTRAMUSCULAR | Status: DC | PRN
  Administered 2014-03-10 – 2014-03-15 (×8): 10 mg via INTRAVENOUS
  Filled 2014-03-10 (×2): qty 1
  Filled 2014-03-10: qty 0.5
  Filled 2014-03-10 (×6): qty 1

## 2014-03-10 MED ORDER — NEOSTIGMINE METHYLSULFATE 10 MG/10ML IV SOLN
INTRAVENOUS | Status: DC | PRN
  Administered 2014-03-10: 4 mg via INTRAVENOUS

## 2014-03-10 MED ORDER — PANTOPRAZOLE SODIUM 40 MG PO TBEC: 40.0000 mg | DELAYED_RELEASE_TABLET | Freq: Every day | ORAL | Status: DC

## 2014-03-10 MED ORDER — PROPOFOL 10 MG/ML IV BOLUS
INTRAVENOUS | Status: DC | PRN
  Administered 2014-03-10: 200 mg via INTRAVENOUS

## 2014-03-10 MED ORDER — NITROGLYCERIN IN D5W 200-5 MCG/ML-% IV SOLN
INTRAVENOUS | Status: DC | PRN
  Administered 2014-03-10: 16.6 ug/min via INTRAVENOUS

## 2014-03-10 MED ORDER — ONDANSETRON HCL 4 MG/2ML IJ SOLN: 4.0000 mg | Freq: Four times a day (QID) | INTRAMUSCULAR | Status: DC | PRN

## 2014-03-10 MED ORDER — ROCURONIUM BROMIDE 50 MG/5ML IV SOLN
INTRAVENOUS | Status: AC
  Filled 2014-03-10: qty 2

## 2014-03-10 MED ORDER — PROPOFOL 10 MG/ML IV BOLUS
INTRAVENOUS | Status: AC
  Filled 2014-03-10: qty 20

## 2014-03-10 MED FILL — Heparin Sodium (Porcine) Inj 1000 Unit/ML: INTRAMUSCULAR | Qty: 30 | Status: AC

## 2014-03-10 MED FILL — Sodium Chloride IV Soln 0.9%: INTRAVENOUS | Qty: 1000 | Status: AC

## 2014-03-10 SURGICAL SUPPLY — 68 items
BLADE 10 SAFETY STRL DISP (BLADE) ×3 IMPLANT
CANISTER SUCTION 2500CC (MISCELLANEOUS) ×3 IMPLANT
CLIP TI MEDIUM 24 (CLIP) ×3 IMPLANT
CLIP TI WIDE RED SMALL 24 (CLIP) ×3 IMPLANT
COVER SURGICAL LIGHT HANDLE (MISCELLANEOUS) ×3 IMPLANT
DRAPE WARM FLUID 44X44 (DRAPE) ×3 IMPLANT
DRSG COVADERM 4X14 (GAUZE/BANDAGES/DRESSINGS) ×3 IMPLANT
DRSG COVADERM 4X8 (GAUZE/BANDAGES/DRESSINGS) ×3 IMPLANT
ELECT BLADE 4.0 EZ CLEAN MEGAD (MISCELLANEOUS) ×3
ELECT BLADE 6.5 EXT (BLADE) IMPLANT
ELECT REM PT RETURN 9FT ADLT (ELECTROSURGICAL) ×3
ELECTRODE BLDE 4.0 EZ CLN MEGD (MISCELLANEOUS) ×1 IMPLANT
ELECTRODE REM PT RTRN 9FT ADLT (ELECTROSURGICAL) ×1 IMPLANT
GEL ULTRASOUND 20GR AQUASONIC (MISCELLANEOUS) ×3 IMPLANT
GLOVE BIOGEL PI IND STRL 6.5 (GLOVE) ×1 IMPLANT
GLOVE BIOGEL PI IND STRL 7.5 (GLOVE) ×1 IMPLANT
GLOVE BIOGEL PI INDICATOR 6.5 (GLOVE) ×2
GLOVE BIOGEL PI INDICATOR 7.5 (GLOVE) ×2
GLOVE ECLIPSE 6.0 STRL STRAW (GLOVE) ×3 IMPLANT
GLOVE SS BIOGEL STRL SZ 7 (GLOVE) ×2 IMPLANT
GLOVE SUPERSENSE BIOGEL SZ 7 (GLOVE) ×4
GLOVE SURG SS PI 7.0 STRL IVOR (GLOVE) ×3 IMPLANT
GOWN STRL REUS W/ TWL LRG LVL3 (GOWN DISPOSABLE) ×4 IMPLANT
GOWN STRL REUS W/ TWL XL LVL3 (GOWN DISPOSABLE) ×1 IMPLANT
GOWN STRL REUS W/TWL LRG LVL3 (GOWN DISPOSABLE) ×8
GOWN STRL REUS W/TWL XL LVL3 (GOWN DISPOSABLE) ×2
GRAFT CV 30X6KNTD STRG TUBE (Vascular Products) ×1 IMPLANT
GRAFT HEMASHIELD 6MM (Vascular Products) ×2 IMPLANT
HEMOSTAT SNOW SURGICEL 2X4 (HEMOSTASIS) ×3 IMPLANT
INSERT FOGARTY 61MM (MISCELLANEOUS) ×3 IMPLANT
INSERT FOGARTY SM (MISCELLANEOUS) ×6 IMPLANT
KIT BASIN OR (CUSTOM PROCEDURE TRAY) ×3 IMPLANT
KIT ROOM TURNOVER OR (KITS) ×3 IMPLANT
LOOP VESSEL MAXI BLUE (MISCELLANEOUS) IMPLANT
LOOP VESSEL MINI RED (MISCELLANEOUS) IMPLANT
NS IRRIG 1000ML POUR BTL (IV SOLUTION) ×6 IMPLANT
PACK AORTA (CUSTOM PROCEDURE TRAY) ×3 IMPLANT
PAD ARMBOARD 7.5X6 YLW CONV (MISCELLANEOUS) ×6 IMPLANT
PUNCH AORTIC ROTATE 5MM 8IN (MISCELLANEOUS) ×3 IMPLANT
SPONGE LAP 18X18 X RAY DECT (DISPOSABLE) IMPLANT
SPONGE LAP 4X18 X RAY DECT (DISPOSABLE) ×3 IMPLANT
STAPLER VISISTAT 35W (STAPLE) ×6 IMPLANT
SUT ETHIBOND 5 LR DA (SUTURE) IMPLANT
SUT PROLENE 1 XLH 60 (SUTURE) ×6 IMPLANT
SUT PROLENE 3 0 SH1 36 (SUTURE) ×3 IMPLANT
SUT PROLENE 4 0 RB 1 (SUTURE)
SUT PROLENE 4-0 RB1 .5 CRCL 36 (SUTURE) IMPLANT
SUT PROLENE 5 0 C 1 36 (SUTURE) ×6 IMPLANT
SUT PROLENE 5 0 CC 1 (SUTURE) IMPLANT
SUT PROLENE 6 0 C 1 30 (SUTURE) ×3 IMPLANT
SUT PROLENE 6 0 CC (SUTURE) ×3 IMPLANT
SUT PROLENE 7 0 BV 1 (SUTURE) ×3 IMPLANT
SUT SILK 2 0 (SUTURE) ×2
SUT SILK 2 0 SH CR/8 (SUTURE) ×3 IMPLANT
SUT SILK 2-0 18XBRD TIE 12 (SUTURE) ×1 IMPLANT
SUT SILK 3 0 (SUTURE) ×2
SUT SILK 3 0 SH CR/8 (SUTURE) IMPLANT
SUT SILK 3-0 18XBRD TIE 12 (SUTURE) ×1 IMPLANT
SUT VIC AB 2-0 CTX 36 (SUTURE) IMPLANT
SUT VIC AB 3-0 MH 27 (SUTURE) ×6 IMPLANT
SUT VIC AB 3-0 SH 27 (SUTURE)
SUT VIC AB 3-0 SH 27X BRD (SUTURE) IMPLANT
TOWEL OR 17X24 6PK STRL BLUE (TOWEL DISPOSABLE) ×6 IMPLANT
TOWEL OR 17X26 10 PK STRL BLUE (TOWEL DISPOSABLE) ×6 IMPLANT
TRAY FOLEY CATH 16FRSI W/METER (SET/KITS/TRAYS/PACK) ×3 IMPLANT
TUBE CONNECTING 12'X1/4 (SUCTIONS) ×2
TUBE CONNECTING 12X1/4 (SUCTIONS) ×4 IMPLANT
WATER STERILE IRR 1000ML POUR (IV SOLUTION) ×6 IMPLANT

## 2014-03-10 NOTE — Anesthesia Procedure Notes (Signed)
Procedure Name: Intubation Date/Time: 03/10/2014 8:51 AM Performed by: Carney Living Pre-anesthesia Checklist: Patient identified, Emergency Drugs available, Suction available, Patient being monitored and Timeout performed Patient Re-evaluated:Patient Re-evaluated prior to inductionOxygen Delivery Method: Circle system utilized Preoxygenation: Pre-oxygenation with 100% oxygen Intubation Type: IV induction Ventilation: Mask ventilation without difficulty Laryngoscope Size: Mac and 4 Grade View: Grade II Tube type: Subglottic suction tube Tube size: 8.0 mm Number of attempts: 1 Airway Equipment and Method: Stylet Placement Confirmation: ETT inserted through vocal cords under direct vision,  positive ETCO2 and breath sounds checked- equal and bilateral Secured at: 23 cm Tube secured with: Tape Dental Injury: Teeth and Oropharynx as per pre-operative assessment

## 2014-03-10 NOTE — Op Note (Signed)
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 A radial arterial line and central venous cath was inserted by anesthesia. Abdomen and groins were prepped with Betadine scrub and solution draped in routine sterile manner. A midline incision was made from xiphoid to below the umbilicus carried into subcutaneous tissue and linea alba using the Bovie. Peritoneal cavity was entered and thoroughly explored. The stomach duodenum mall bowel and colon were unremarkable. Liver was smooth no masses palpable. Gallbladder was present no stones were palpable. Transverse colon was elevated the intestines reflected to the right side retroperitoneum incised exposing the aorta from the renal arteries to the bifurcation. It was diffusely diseased with atherosclerotic changes with calcification in some areas but was soft anteriorly in the midportion. The IMA was noted to be chronically occluded. Both common iliac arteries were heavily calcified. Following this the superior mesenteric artery was exposed at the root of the mesentery. It was pulseless. It was known to be totally occluded proximally. It was dissected free down to about 4-5 cm. It was thickened but patent and did have some monophasic Doppler flow. At this point it was decided to perform the procedure without doing aortobifemoral bypass grafting since the aorta did seem to be satisfactory to insert a graft to the SMA. Therefore patient was given 25 g mannitol and heparinized. The  SMA was occluded proximally and distally opened longitudinally a 15 blade and extended with the Potts scissors. It had very sluggish inflow coming from a collateral. There was fair backbleeding. It was thickened but widely patent. A 6 mm Hemashield Dacron graft was slightly spatulated and anastomosed end to side with 6-0 Prolene. Clamps were then released and the aorta was examined for a good site to anastomose the graft. The small bowel and duodenum were rotated back medially to determine the length needed for the graft. Site on the anterior aspect of the aorta proximal to the IMA the distal renal arteries was selected. Graft was the aorta was then occluded proximally and distally with clamps opened with 11 blade and enlarged with a 5 mm punch using the punch 3 times. This left an oval opening. There was calcific disease within the lumen but it was widely patent had excellent flow. Graft was carefully measured spatulated and anastomosed end to side with 6-0 Prolene. After appropriate antegrade and retrograde flushing the closure was completed reestablishment of flow down the legs and then into the SMA. There was excellent Doppler flow in the SMA and both iliac arteries and palpable femoral pulses after the aorta was opened. Protamine was given to reverse the heparin following adequate hemostasis wound irrigated with saline retroperitoneum approximated with 3-0 Vicryl and following thorough irrigation of the peritoneal cavity linea alba closed #1 Prolene skin with staples sterile dressing applied patient taken to the recovery room in stable condition estimated blood loss 250 cc Urinary output and the patient was stable hemodynamically throughout the case there was good Doppler flow in both the dorsalis pedis and posterior tibial arteries at the conclusion of the case  Procedure Details:   [name],  MD 03/10/2014 12:11 PM

## 2014-03-10 NOTE — Progress Notes (Signed)
Patient requesting more pain medicine with a 10/10 pain in abdomen despite maximum ordered doses of morphine given.  MD Kellie Simmering made aware.  Morphine dose increased and dilaudid ordered to be used if unable to control pain with morphine.  If pain still not controlled with these meds, PCA will then be started.  MD Kellie Simmering also instructed RN to maintain a SBP of 160 or less via cuff.  10 mg hydralazine previously given for SBP> 170.  Will continue to monitor.

## 2014-03-10 NOTE — Anesthesia Postprocedure Evaluation (Signed)
Anesthesia Post Note  Patient: [name]  Procedure(s) Performed: Procedure(s) (LRB): AORTO TO SUPERIOR MESENTERIC ARTERY BYPASS (N/A)  Anesthesia type: General  Patient location: PACU  Post pain: Pain level controlled  Post assessment: Patient's Cardiovascular Status Stable  Last Vitals:  Filed Vitals:   03/10/14 1345  BP:   Pulse:   Temp: 36.7 C  Resp:     Post vital signs: Reviewed and stable  Level of consciousness: alert  Complications: No apparent anesthesia complications

## 2014-03-10 NOTE — H&P (View-Only) (Signed)
Subjective:     Patient ID: Connor Bryant, male   DOB: 13-Aug-1946, 67 y.o.   MRN: [id number]  HPI this 68 year old male returns for further discussion about his chronic mesenteric ischemic symptoms. He has had 25-30 pounds weight loss since August of 2014 without dieting. He also has had chronic diarrhea. He had mesenteric angiography performed by Dr. Geryl Councilman last week which revealed total occlusion of the superior mesenteric artery and inferior mesenteric artery but no significant stenosis in the celiac axis or renal arteries. He states that his diarrhea did improve for a few days but has now returned. He denies any chest pain or dyspnea on exertion. He smoked for 50 years but discontinued this 5 years ago. He has no cardiac history.  Past Medical History  Diagnosis Date  . Thoracic spine fracture   . HTN (hypertension)   . DM type 2 (diabetes mellitus, type 2)     no meds  . Hyperlipidemia   . Renal insufficiency     mild  . Personal history of colonic polyp-adenoma 11/24/2013  . Gallstone     History  Substance Use Topics  . Smoking status: Former Smoker    Types: Cigarettes    Quit date: 09/24/2008  . Smokeless tobacco: Former Systems developer    Types: Chew     Comment: Pt just tried  for short time  . Alcohol Use: 4.8 oz/week    8 Cans of beer per week     Comment: 3-4 beers every night    Family History  Problem Relation Age of Onset  . Hypertension Father   . Coronary artery disease Father   . Hyperlipidemia Father   . Diabetes Father   . Bladder Cancer Father   . Cancer Father   . Heart disease Father   . Heart attack Father   . Colon cancer Neg Hx   . Throat cancer Neg Hx   . Kidney disease Neg Hx   . Liver disease Neg Hx   . Diabetes Mother   . Heart disease Mother     before age 69  . Hyperlipidemia Mother   . Hypertension Mother   . AAA (abdominal aortic aneurysm) Mother     No Known Allergies  Current outpatient prescriptions:allopurinol (ZYLOPRIM) 100 MG tablet,  Take 100 mg by mouth daily. , Disp: , Rfl: ;  aspirin 81 MG tablet, Take 81 mg by mouth daily., Disp: , Rfl: ;  colestipol (COLESTID) 5 G granules, Take 5 g by mouth daily. With supper, Disp: 500 g, Rfl: 1;  hydrochlorothiazide (HYDRODIURIL) 25 MG tablet, Take 25 mg by mouth daily., Disp: , Rfl:  loperamide (IMODIUM) 2 MG capsule, Take by mouth as needed for diarrhea or loose stools., Disp: , Rfl: ;  metoprolol succinate (TOPROL-XL) 100 MG 24 hr tablet, Take 100 mg by mouth daily. Take with or immediately following a meal., Disp: , Rfl: ;  trandolapril (MAVIK) 4 MG tablet, Take 4 mg by mouth daily., Disp: , Rfl: ;  verapamil (COVERA HS) 240 MG (CO) 24 hr tablet, Take 480 mg by mouth daily. , Disp: , Rfl:  atorvastatin (LIPITOR) 20 MG tablet, Take 20 mg by mouth daily., Disp: , Rfl:   BP 142/62  Pulse 48  Resp 16  Ht 5\' 8"  (1.727 m)  Wt 182 lb (82.555 kg)  BMI 27.68 kg/m2  Body mass index is 27.68 kg/(m^2).           Review of Systems denies chest pain, dyspnea  on exertion, PND, orthopnea, hemoptysis. All systems is negative and a complete review of systems. No leg claudication noted.BP 142/62  Pulse 48  Resp 16  Ht 5\' 8"  (1.727 m)  Wt 182 lb (82.555 kg)  BMI 27.68 kg/m2      Objective:   Physical Exam  Gen.-alert and oriented x3 in no apparent distress HEENT normal for age Lungs no rhonchi or wheezing Cardiovascular regular rhythm no murmurs carotid pulses 3+ palpable no bruits audible Abdomen soft nontender no palpable masses Musculoskeletal free of  major deformities Skin clear -no rashes Neurologic normal Lower extremities 3+ femoral and dorsalis pedis pulses palpable bilaterally with no edema   Assessment:     Today I reviewed the mesenteric angiography and abdominal aortogram. Patient does have total occlusion of the SMA and IMA. There is diffuse calcification and the infrarenal abdominal aorta and iliac arteries despite good palpable pulses.  #1 chronic mesenteric  ischemia due to the SMA and IMA occlusion #2 diffuse calcification and aortoiliac system with palpable pulses     Plan:      discussed the situation at length with the patient and recommended proceeding with aorto to SMA bypass with possible aortobifemoral bypass if needed-this will be determined at the time of surgery Discussed mortality and morbidity of this procedure and possibility of severe diffuse mesenteric infarction without surgery He would like to proceed  Will obtain nuclear cardiac stress test later this week and proceed with surgery on Wednesday, June 17-risks and benefits thoroughly discussed

## 2014-03-10 NOTE — Interval H&P Note (Signed)
History and Physical Interval Note:  03/10/2014 7:27 AM  Connor Bryant  has presented today for surgery, with the diagnosis of Chronic vascular insufficiency of intestine; Chronic total occlusion of artery of the extremities   The various methods of treatment have been discussed with the patient and family. After consideration of risks, benefits and other options for treatment, the patient has consented to  Procedure(s): AORTO TO SUPERIOR MESENTERIC ARTERY BYPASS; POSSIBLE AORTOBIFEMORAL BYPASS (N/A) as a surgical intervention .  The patient's history has been reviewed, patient examined, no change in status, stable for surgery.  I have reviewed the patient's chart and labs.  Questions were answered to the patient's satisfaction.     [name]

## 2014-03-10 NOTE — Transfer of Care (Signed)
Immediate Anesthesia Transfer of Care Note  Patient: Connor Bryant  Procedure(s) Performed: Procedure(s): AORTO TO SUPERIOR MESENTERIC ARTERY BYPASS (N/A)  Patient Location: PACU  Anesthesia Type:General  Level of Consciousness: awake, alert , oriented and patient cooperative  Airway & Oxygen Therapy: Patient Spontanous Breathing and Patient connected to nasal cannula oxygen  Post-op Assessment: Report given to PACU RN, Post -op Vital signs reviewed and stable and Patient moving all extremities X 4  Post vital signs: Reviewed and stable  Complications: No apparent anesthesia complications

## 2014-03-10 NOTE — Anesthesia Preprocedure Evaluation (Signed)
Anesthesia Evaluation  Patient identified by MRN, date of birth, ID band Patient awake    Reviewed: Allergy & Precautions, H&P , NPO status , Patient's Chart, lab work & pertinent test results, reviewed documented beta blocker date and time   Airway Mallampati: II TM Distance: >3 FB Neck ROM: full    Dental   Pulmonary neg pulmonary ROS, former smoker,  breath sounds clear to auscultation        Cardiovascular hypertension, On Medications and On Home Beta Blockers + Peripheral Vascular Disease + Valvular Problems/Murmurs Rhythm:regular     Neuro/Psych negative neurological ROS  negative psych ROS   GI/Hepatic negative GI ROS, Neg liver ROS,   Endo/Other  diabetes, Oral Hypoglycemic Agents  Renal/GU Renal InsufficiencyRenal disease  negative genitourinary   Musculoskeletal   Abdominal   Peds  Hematology negative hematology ROS (+)   Anesthesia Other Findings See surgeon's H&P   Reproductive/Obstetrics negative OB ROS                           Anesthesia Physical Anesthesia Plan  ASA: III  Anesthesia Plan: General   Post-op Pain Management:    Induction: Intravenous  Airway Management Planned: Oral ETT  Additional Equipment: Arterial line, CVP and Ultrasound Guidance Line Placement  Intra-op Plan:   Post-operative Plan: Possible Post-op intubation/ventilation  Informed Consent: I have reviewed the patients History and Physical, chart, labs and discussed the procedure including the risks, benefits and alternatives for the proposed anesthesia with the patient or authorized representative who has indicated his/her understanding and acceptance.   Dental Advisory Given  Plan Discussed with: CRNA and Surgeon  Anesthesia Plan Comments:         Anesthesia Quick Evaluation

## 2014-03-11 ENCOUNTER — Inpatient Hospital Stay (HOSPITAL_COMMUNITY): Payer: Medicare HMO

## 2014-03-11 LAB — COMPREHENSIVE METABOLIC PANEL
ALT: 11 U/L (ref 0–53)
AST: 13 U/L (ref 0–37)
Albumin: 3.2 g/dL — ABNORMAL LOW (ref 3.5–5.2)
Alkaline Phosphatase: 82 U/L (ref 39–117)
BUN: 10 mg/dL (ref 6–23)
CO2: 26 meq/L (ref 19–32)
Calcium: 8.6 mg/dL (ref 8.4–10.5)
Chloride: 103 mEq/L (ref 96–112)
Creatinine, Ser: 0.76 mg/dL (ref 0.50–1.35)
GFR calc non Af Amer: >90 mL/min (ref >90)
GLUCOSE: 155 mg/dL — AB (ref 70–99)
Potassium: 3.8 mEq/L (ref 3.7–5.3)
SODIUM: 141 meq/L (ref 137–147)
Total Bilirubin: 0.6 mg/dL (ref 0.3–1.2)
Total Protein: 6.1 g/dL (ref 6.0–8.3)

## 2014-03-11 LAB — CBC
HCT: 31.4 % — ABNORMAL LOW (ref 39.0–52.0)
Hemoglobin: 10 g/dL — ABNORMAL LOW (ref 13.0–17.0)
MCH: 28 pg (ref 26.0–34.0)
MCHC: 31.8 g/dL (ref 30.0–36.0)
MCV: 88 fL (ref 78.0–100.0)
Platelets: 211 10*3/uL (ref 150–400)
RBC: 3.57 MIL/uL — ABNORMAL LOW (ref 4.22–5.81)
RDW: 14.5 % (ref 11.5–15.5)
WBC: 13.6 10*3/uL — ABNORMAL HIGH (ref 4.0–10.5)

## 2014-03-11 LAB — POCT I-STAT 3, ART BLOOD GAS (G3+)
ACID-BASE DEFICIT: 1 mmol/L (ref 0.0–2.0)
Bicarbonate: 24.5 mEq/L — ABNORMAL HIGH (ref 20.0–24.0)
O2 Saturation: 95 %
PCO2 ART: 41.4 mmHg (ref 35.0–45.0)
Patient temperature: 98.5
TCO2: 26 mmol/L (ref 0–100)
pH, Arterial: 7.38 (ref 7.350–7.450)
pO2, Arterial: 78 mmHg — ABNORMAL LOW (ref 80.0–100.0)

## 2014-03-11 LAB — GLUCOSE, CAPILLARY
Glucose-Capillary: 137 mg/dL — ABNORMAL HIGH (ref 70–99)
Glucose-Capillary: 138 mg/dL — ABNORMAL HIGH (ref 70–99)
Glucose-Capillary: 146 mg/dL — ABNORMAL HIGH (ref 70–99)
Glucose-Capillary: 154 mg/dL — ABNORMAL HIGH (ref 70–99)
Glucose-Capillary: 161 mg/dL — ABNORMAL HIGH (ref 70–99)
Glucose-Capillary: 166 mg/dL — ABNORMAL HIGH (ref 70–99)

## 2014-03-11 LAB — AMYLASE: Amylase: 38 U/L (ref 0–105)

## 2014-03-11 LAB — MAGNESIUM: Magnesium: 1.8 mg/dL (ref 1.5–2.5)

## 2014-03-11 MED ORDER — PANTOPRAZOLE SODIUM 40 MG IV SOLR
40.0000 mg | Freq: Every day | INTRAVENOUS | Status: DC
  Administered 2014-03-11 – 2014-03-14 (×4): 40 mg via INTRAVENOUS
  Filled 2014-03-11 (×6): qty 40

## 2014-03-11 NOTE — Evaluation (Signed)
Physical Therapy Evaluation Patient Details Name: Connor Bryant MRN: [id number] DOB: [birth year] Today's Date: 03/11/2014   History of Present Illness  Pt adm with mesenteric ischemia secondary to occlusion of superior mesenteric artery and underwent AORTO TO SUPERIOR MESENTERIC ARTERY BYPASS on 03/10/14.  Clinical Impression  Pt admitted with above. Pt currently with functional limitations due to the deficits listed below (see PT Problem List).  Pt will benefit from skilled PT to increase their independence and safety with mobility to allow discharge to the venue listed below.       Follow Up Recommendations No PT follow up    Equipment Recommendations  Rolling walker with 5" wheels (possibly)    Recommendations for Other Services       Precautions / Restrictions Precautions Precautions: None      Mobility  Bed Mobility Overal bed mobility: Needs Assistance Bed Mobility: Sit to Supine       Sit to supine: Min assist   General bed mobility comments: Assist to bring feet up.  Transfers Overall transfer level: Needs assistance Equipment used: Pushed w/c Transfers: Sit to/from Stand Sit to Stand: Min assist         General transfer comment: Verbal cues for hand placement and assist to bring hips up.  Ambulation/Gait Ambulation/Gait assistance: Min guard Ambulation Distance (Feet): 150 Feet Assistive device:  (pushing w/c) Gait Pattern/deviations: Step-through pattern;Decreased step length - right;Decreased step length - left;Decreased stride length Gait velocity: decr Gait velocity interpretation: Below normal speed for age/gender General Gait Details: Assist for balance  Stairs            Wheelchair Mobility    Modified Rankin (Stroke Patients Only)       Balance Overall balance assessment: Needs assistance Sitting-balance support: No upper extremity supported Sitting balance-Leahy Scale: Good     Standing balance support: No upper extremity  supported Standing balance-Leahy Scale: Fair                               Pertinent Vitals/Pain VSS. Pt premedicated for pain.    Home Living Family/patient expects to be discharged to:: Private residence Living Arrangements: Alone (Spouse/significant other is in facility) Available Help at Discharge: Family;Available PRN/intermittently Type of Home: House Home Access: Stairs to enter   [employer] of Steps: 1 Home Layout: One level Home Equipment: None      Prior Function Level of Independence: Independent               Hand Dominance        Extremity/Trunk Assessment   Upper Extremity Assessment: Defer to OT evaluation           Lower Extremity Assessment: Generalized weakness         Communication   Communication: No difficulties  Cognition Arousal/Alertness: Awake/alert Behavior During Therapy: WFL for tasks assessed/performed Overall Cognitive Status: Within Functional Limits for tasks assessed                      General Comments      Exercises        Assessment/Plan    PT Assessment Patient needs continued PT services  PT Diagnosis Difficulty walking;Generalized weakness   PT Problem List Decreased strength;Decreased activity tolerance;Decreased balance;Decreased mobility  PT Treatment Interventions DME instruction;Gait training;Functional mobility training;Therapeutic activities;Therapeutic exercise;Balance training;Patient/family education   PT Goals (Current goals can be found in the Care Plan section) Acute Rehab  PT Goals Patient Stated Goal: Return home PT Goal Formulation: With patient Time For Goal Achievement: 03/18/14 Potential to Achieve Goals: Good    Frequency Min 3X/week   Barriers to discharge Decreased caregiver support      Co-evaluation               End of Session Equipment Utilized During Treatment: Oxygen Activity Tolerance: Patient tolerated treatment well Patient  left: in bed;with call bell/phone within reach Nurse Communication: Mobility status         Time: 3149-7026 PT Time Calculation (min): 21 min   Charges:   PT Evaluation $Initial PT Evaluation Tier I: 1 Procedure PT Treatments $Gait Training: 8-22 mins   PT G Codes:          MAYCOCK,CARY 2014/03/28, 12:30 PM  [employer] PT [phone]

## 2014-03-11 NOTE — Progress Notes (Signed)
Utilization review completed. Bertha Stanfill, RN, BSN. 

## 2014-03-11 NOTE — Progress Notes (Signed)
[facility]  Subjective  - Doing well.  He has increased pain with coughing.   Objective 163/58 64 99.4 F (37.4 C) (Oral) 14 95%  Intake/Output Summary (Last 24 hours) at 03/11/14 0742 Last data filed at 03/11/14 0700  Gross per 24 hour  Intake   5270 ml  Output   4430 ml  Net    840 ml    Palpable DP 2+ pulses bilaterally. Abdomin soft, min. BS, incisional dressing C/D Heart RRR NG tube in place  Assessment/Planning: POD #1 AORTO TO SUPERIOR MESENTERIC ARTERY BYPASS (N/A)    Plan D/C NG tube maintain NPO D/C A line, once up may D/C foley. Continue IV dilaudid 1-2 q 1-2 hours hold off on PCA for now. Out of bed today. He will stay on 2S until tomorrow.    Laurence Slate [facility] 03/11/2014 7:42 AM --  Laboratory Lab Results:  Recent Labs  03/10/14 1206 03/11/14 0439  WBC 9.8 13.6*  HGB 9.4* 10.0*  HCT 28.9* 31.4*  PLT 162 211   BMET  Recent Labs  03/10/14 1206 03/11/14 0439  NA 142 141  K 4.0 3.8  CL 105 103  CO2 25 26  GLUCOSE 171* 155*  BUN 13 10  CREATININE 0.78 0.76  CALCIUM 8.4 8.6    COAG Lab Results  Component Value Date   INR 1.11 03/10/2014   INR 1.01 03/04/2014   No results found for this basename: PTT

## 2014-03-12 ENCOUNTER — Encounter (HOSPITAL_COMMUNITY): Payer: Self-pay | Admitting: Vascular Surgery

## 2014-03-12 LAB — GLUCOSE, CAPILLARY
GLUCOSE-CAPILLARY: 133 mg/dL — AB (ref 70–99)
GLUCOSE-CAPILLARY: 141 mg/dL — AB (ref 70–99)
GLUCOSE-CAPILLARY: 144 mg/dL — AB (ref 70–99)
GLUCOSE-CAPILLARY: 159 mg/dL — AB (ref 70–99)
GLUCOSE-CAPILLARY: 170 mg/dL — AB (ref 70–99)
Glucose-Capillary: 157 mg/dL — ABNORMAL HIGH (ref 70–99)

## 2014-03-12 MED ORDER — PNEUMOCOCCAL VAC POLYVALENT 25 MCG/0.5ML IJ INJ
0.5000 mL | INJECTION | INTRAMUSCULAR | Status: DC
  Filled 2014-03-12 (×2): qty 0.5

## 2014-03-12 NOTE — Clinical Documentation Improvement (Signed)
03/12/14  Dear Mallie Darting Rolley Sims   Possible Clinical Conditions?    Expected Acute Blood Loss Anemia  Acute Blood Loss Anemia  Acute on chronic blood loss anemia  Precipitous drop in Hematocrit  Other Condition  Cannot Clinically Determine    Risk Factors:  EBL:  250 ml per 6/17 Anesthesia record.  Diagnostics: H&H on 6/11:  11.4/35.6 H&H on 6/17:   8.5/25.0  IV fluids / plasma expanders: 6/17:  Albumin: 500 ml per 6/17 Anesthesia record. 6/17:  LR: 2400 ml per 6/17 Anesthesia record.   Thank You, [name], Clinical Documentation Specialist:  [phone]  [employer]

## 2014-03-12 NOTE — Progress Notes (Signed)
Pt t/x to 2W10 in wheelchair on monitor without event. Bedside report given to Amesti who was present for tele hookup. Pt denied offer to phone family and make them aware of room change. He stated, "I'll call them on my cell later."  Pt's glasses, cell phone and phone charger present in his possession during tx  Lorre Munroe

## 2014-03-12 NOTE — Progress Notes (Signed)
Pt foley catheter discontinued at 0900 on 03/11/14.  Pt unable to void.  Bladder scan at 1630 on 03/11/14 showed 394 cc.  Bladder scan at 1800 on 03/11/14 showed 554 cc, in and out cath performed with 475 cc emptied.  Pt still unable to void on own.  Bladder scan at 0005 03/12/14 showed 250 cc.  Stood pt, per request, to attempt void with no success.  Pt states bladder feels full.  Paged Dr. Trula Slade to advised.  Dr. Trula Slade stated to wait additional 3 hours and if pt still unable to void replace foley catheter.  Will continue to monitor.

## 2014-03-12 NOTE — Progress Notes (Signed)
PT Cancellation Note  Patient Details Name: Connor Bryant MRN: [id number] DOB: 15-May-1946   Cancelled Treatment:    Reason Eval/Treat Not Completed: Other (comment) (pt had recently walked  prior to move to new room per pt.)   Claretha Cooper 03/12/2014, 4:55 PM

## 2014-03-12 NOTE — Progress Notes (Signed)
Pt still unable to void on own.  Pt continues to feel discomfort and "full".  Will replace foley catheter and continue to monitor.

## 2014-03-12 NOTE — Progress Notes (Signed)
INITIAL NUTRITION ASSESSMENT  DOCUMENTATION CODES Per approved criteria  -Not Applicable   INTERVENTION: Advance diet as medically appropriate, add supplements accordingly RD to follow for nutrition care plan  NUTRITION DIAGNOSIS: Inadequate oral intake related to altered GI function as evidenced by NPO status  Goal: Pt to meet >/= 90% of their estimated nutrition needs   Monitor:  PO diet advancement & intake, weight, labs, I/O's  Reason for ssessment: Malnutrition Screening Tool Report  68 y.o. male  Admitting Dx: mesenteric ischemia secondary to occlusion of superior mesenteric artery  ASSESSMENT: Patient with PMH of HTN, DM, thoracic spine fracture; admitted with mesenteric ischemia secondary to occlusion of superior mesenteric artery.  Patient s/p procedure 6/17: AORTO TO SUPERIOR MESENTERIC ARTERY BYPASS   Patient reports he was eating like a "[place] racehorse" PTA; endorses a 25-30 lb wt loss since August 2014 (11%) -- not significant for time frame; noted pt also with chronic diarrhea; currently NPO; amenable to oral nutrition supplements once/as able; RD to monitor.  Reported no nausea or vomiting and has not had flatus or bowel movement yet.  No muscle or subcutaneous fat depletion noticed.  Height: Ht Readings from Last 1 Encounters:  03/10/14 5\' 8"  (1.727 m)    Weight: Wt Readings from Last 1 Encounters:  03/12/14 186 lb 1.6 oz (84.414 kg)    Ideal Body Weight: 154 lb  % Ideal Body Weight: 121%  Wt Readings from Last 40 Encounters:  03/12/14 186 lb 1.6 oz (84.414 kg)  03/12/14 186 lb 1.6 oz (84.414 kg)  03/04/14 183 lb 6.8 oz (83.201 kg)  03/01/14 182 lb (82.555 kg)  02/23/14 180 lb (81.647 kg)  02/23/14 180 lb (81.647 kg)  02/18/14 180 lb 4 oz (81.761 kg)  02/16/14 179 lb (81.194 kg)  01/06/14 184 lb (83.462 kg)  12/29/13 184 lb 9.6 oz (83.734 kg)  11/17/13 197 lb (89.359 kg)  10/21/13 197 lb (89.359 kg)    Usual Body Weight: ~ 211 lb --  August 2014  % Usual Body Weight: 88%  BMI:  Body mass index is 28.3 kg/(m^2).  Estimated Nutritional Needs: Kcal: 2100-2300 Protein: 105-115 gm  Fluid: 2.1-2.3 L  Skin: surgical abdominal incision   Diet Order: NPO  EDUCATION NEEDS: -No education needs identified at this time   Intake/Output Summary (Last 24 hours) at 03/12/14 1519 Last data filed at 03/12/14 1400  Gross per 24 hour  Intake   2300 ml  Output   1555 ml  Net    745 ml    Labs:   Recent Labs Lab 03/10/14 1135 03/10/14 1206 03/11/14 0439  NA 140 142 141  K 3.7 4.0 3.8  CL  --  105 103  CO2  --  25 26  BUN  --  13 10  CREATININE  --  0.78 0.76  CALCIUM  --  8.4 8.6  MG  --  1.8 1.8  GLUCOSE 168* 171* 155*    CBG (last 3)   Recent Labs  03/12/14 0332 03/12/14 0746 03/12/14 1140  GLUCAP 157* 170* 144*    Scheduled Meds: . antiseptic oral rinse  15 mL Mouth Rinse q12n4p  . chlorhexidine  15 mL Mouth Rinse BID  . docusate sodium  100 mg Oral Daily  . insulin aspart  0-15 Units Subcutaneous 6 times per day  . pantoprazole (PROTONIX) IV  40 mg Intravenous Daily  . [START ON 03/13/2014] pneumococcal 23 valent vaccine  0.5 mL Intramuscular Tomorrow-1000    Continuous Infusions: .  dextrose 5 % and 0.45 % NaCl with KCl 20 mEq/L 100 mL/hr at 03/12/14 1435  . DOPamine      Past Medical History  Diagnosis Date  . Thoracic spine fracture   . HTN (hypertension)   . DM type 2 (diabetes mellitus, type 2)     no meds  . Hyperlipidemia   . Renal insufficiency     mild  . Personal history of colonic polyp-adenoma 11/24/2013  . Gallstone   . Heart murmur     was told one time he had a murmur, not since 30 years ago  . Arthritis     Past Surgical History  Procedure Laterality Date  . Debridement tennis elbow Right   . Tonsillectomy and adenoidectomy    . Colonoscopy    . Esophagogastroduodenoscopy    . Mesenteric artery bypass N/A 03/10/2014    Procedure: AORTO TO SUPERIOR MESENTERIC  ARTERY BYPASS;  Surgeon: [name], MD;  Location: [facility];  Service: Vascular;  Laterality: N/A;    Arthur Holms, RD, LDN Pager #: [phone] After-Hours Pager #: [phone]

## 2014-03-12 NOTE — Evaluation (Signed)
Occupational Therapy Evaluation Patient Details Name: Connor Bryant MRN: [id number] DOB: 30-Dec-1945 Today's Date: 03/12/2014    History of Present Illness Pt adm with mesenteric ischemia secondary to occlusion of superior mesenteric artery and underwent AORTO TO SUPERIOR MESENTERIC ARTERY BYPASS on 03/10/14.   Clinical Impression   Patient is s/p Aorto to superior mesenteric artery bypass surgery resulting in functional limitations due to the deficits listed below (see OT problem list). PTA independent with all adls. Patient will benefit from skilled OT acutely to increase independence and safety with ADLS to allow discharge [place]. Pt must be MOD I for d/c home alone. Ot to follow acutely for adl retraining and AE education     Follow Up Recommendations  Home health OT (due to no (A) upon d/c and needs (A) for adls)    Equipment Recommendations  None recommended by OT    Recommendations for Other Services       Precautions / Restrictions Precautions Precautions: None      Mobility Bed Mobility Overal bed mobility: Needs Assistance Bed Mobility: Sit to Supine       Sit to supine: Mod assist   General bed mobility comments: (A) with bil LE  Transfers Overall transfer level: Needs assistance Equipment used: Pushed w/c Transfers: Sit to/from Stand Sit to Stand: Min assist         General transfer comment: v/c for hand placement    Balance Overall balance assessment: Needs assistance Sitting-balance support: Bilateral upper extremity supported;Feet supported Sitting balance-Leahy Scale: Fair     Standing balance support: Bilateral upper extremity supported;During functional activity Standing balance-Leahy Scale: Fair                              ADL Overall ADL's : Needs assistance/impaired Eating/Feeding: NPO   Grooming: Wash/dry face;Set up;Sitting               Lower Body Dressing: Maximal assistance;Sit to/from stand Lower Body  Dressing Details (indicate cue type and reason): needs AE education Toilet Transfer: Min guard;RW;BSC           Functional mobility during ADLs: Min guard;Wheelchair General ADL Comments: Pt in chair on arrival and agreeable to OT evaluation. pt requesting to ambulate as part of evaluation. Md leaving room on OT arrival telling patient to walk so pt immediately asking to walk. Pt using w/c to use into hall. Pt needed cues for hand placement for sit <>stand. pt easily agitiated by gait belt application but agreed after education to [employer]. pt must be MOD I to return home independently. pt s wife current at SNF due to NWB LE injury. Pt will have assistance     Vision                     Perception     Praxis      Pertinent Vitals/Pain VSS     Hand Dominance Right   Extremity/Trunk Assessment Upper Extremity Assessment Upper Extremity Assessment: Overall WFL for tasks assessed (no assessed past 90)   Lower Extremity Assessment Lower Extremity Assessment: Defer to PT evaluation   Cervical / Trunk Assessment Cervical / Trunk Assessment: Normal   Communication Communication Communication: No difficulties   Cognition Arousal/Alertness: Awake/alert Behavior During Therapy: WFL for tasks assessed/performed Overall Cognitive Status: Within Functional Limits for tasks assessed  General Comments       Exercises       Shoulder Instructions      Home Living Family/patient expects to be discharged to:: Private residence Living Arrangements: Alone Available Help at Discharge: Family;Available PRN/intermittently Type of Home: House Home Access: Stairs to enter Entrance Stairs-Number of Steps: 1   Home Layout: One level     Bathroom Shower/Tub: Occupational psychologist: Handicapped height     Home Equipment: None;Grab bars - tub/shower (shower too small for DME)   Additional Comments: wife currently at snf with NWB  LE injury in [place]      Prior Functioning/Environment Level of Independence: Independent             OT Diagnosis:     OT Problem List:     OT Treatment/Interventions:      OT Goals(Current goals can be found in the care plan section) Acute Rehab OT Goals Patient Stated Goal: Return home OT Goal Formulation: With patient Time For Goal Achievement: 03/26/14 Potential to Achieve Goals: Good  OT Frequency: Min 2X/week   Barriers to D/C:            Co-evaluation              End of Session Equipment Utilized During Treatment: Gait belt Nurse Communication: Mobility status;Precautions  Activity Tolerance: Patient tolerated treatment well Patient left: in bed;with call bell/phone within reach   Time: 1145-1210 OT Time Calculation (min): 25 min Charges:  OT General Charges $OT Visit: 1 Procedure OT Evaluation $Initial OT Evaluation Tier I: 1 Procedure OT Treatments $Self Care/Home Management : 8-22 mins G-Codes:    Parke Poisson B 2014/04/10, 1:04 PM Pager: [phone]

## 2014-03-12 NOTE — Progress Notes (Signed)
Patient ID: Connor Bryant, male   DOB: Feb 03, 1946, 68 y.o.   MRN: [id number] Vascular Surgery Progress Note  Subjective: 2 days post aorta to superior mesenteric artery bypass for chronic mesenteric ischemia with occlusion of SMA proximally. Patient complaining of mild incisional discomfort which is controlled with analgesics. Has had no nausea and vomiting. Is taking no eos yet. No flatus or bowel movement. Has been ambulating some. Foley catheter was discontinued yesterday but had to be reinserted because of inability to void. No complaints.  Objective:  Filed Vitals:   03/12/14 1300  BP:   Pulse: 79  Temp:   Resp: 16    General alert and oriented x3 Abdomen relatively soft 3+ femoral and 2-3+ dorsalis pedis pulses palpable bilaterally.   Labs:  Recent Labs Lab 03/10/14 1206 03/11/14 0439  CREATININE 0.78 0.76    Recent Labs Lab 03/10/14 1135 03/10/14 1206 03/11/14 0439  NA 140 142 141  K 3.7 4.0 3.8  CL  --  105 103  CO2  --  25 26  BUN  --  13 10  CREATININE  --  0.78 0.76  GLUCOSE 168* 171* 155*  CALCIUM  --  8.4 8.6    Recent Labs Lab 03/10/14 1135 03/10/14 1206 03/11/14 0439  WBC  --  9.8 13.6*  HGB 8.8* 9.4* 10.0*  HCT 26.0* 28.9* 31.4*  PLT  --  162 211    Recent Labs Lab 03/10/14 1206  INR 1.11    I/O last 3 completed shifts: In: 9233 [I.V.:3600; NG/GT:210] Out: 2425 [Urine:2225; Emesis/NG output:200]  Imaging: Dg Chest Portable 1 View  03/11/2014   CLINICAL DATA:  post op  EXAM: PORTABLE CHEST - 1 VIEW  COMPARISON:  Portable chest radiograph 03/10/2014.  FINDINGS: Cardiac silhouette is enlarged. Support lines and tubes stable. Atherosclerotic calcifications in the aorta. Minimal atelectasis within the lung bases. No acute osseous abnormalities. Old left clavicle fracture appreciated.  IMPRESSION: Minimal atelectasis within the lung bases otherwise stable chest radiograph.   Electronically Signed   By: Margaree Mackintosh M.D.   On: 03/11/2014  08:03    Assessment/Plan:  POD #2  LOS: 2 days  s/p Procedure(s): AORTO TO SUPERIOR MESENTERIC ARTERY BYPASS  #1 GI-patient doing well without nasogastric tube with resolving ileus No nausea or vomiting and has not had flatus or bowel movement yet Continue to keep patient n.p.o. and advanced to sips of clear liquids as tolerated tomorrow or Sunday #2 vascular-patient with excellent distal pulses-2-3+ dorsalis pedis-despite severe iliac occlusive disease which is nonocclusive. #3 hematologic-hematocrit stable at 31%-patient did have expected blood loss anemia postoperatively which has resolved #4 GU---Foley removed and had to be reinserted. We'll try to DC again in the a.m. #5 cardiovascular-blood pressure and heart rate stable with no arrhythmias or hypotension-doing well  Plan transfer to 2 west today and continue to increase ambulation Will DC Foley again in a.m. and advance diet over weekend as tolerated as per Dr. Donnetta Hutching Burnis Medin give duplex suppository today    [name], MD 03/12/2014 1:25 PM

## 2014-03-13 LAB — GLUCOSE, CAPILLARY
GLUCOSE-CAPILLARY: 156 mg/dL — AB (ref 70–99)
Glucose-Capillary: 124 mg/dL — ABNORMAL HIGH (ref 70–99)
Glucose-Capillary: 145 mg/dL — ABNORMAL HIGH (ref 70–99)
Glucose-Capillary: 161 mg/dL — ABNORMAL HIGH (ref 70–99)
Glucose-Capillary: 178 mg/dL — ABNORMAL HIGH (ref 70–99)

## 2014-03-13 NOTE — Progress Notes (Signed)
Remove rt central line. Tip intact. Vaseline gauze applied, pressure applied, pt on bedrest for 73mins. Pt tolerated monitoring will continue.

## 2014-03-13 NOTE — Progress Notes (Signed)
Subjective: Interval History: none.. no nausea or vomiting. He does have Foley removed with no voiding yet. Did well with liquids this morning  Objective: Vital signs in last 24 hours: Temp:  [98.6 F (37 C)-99.1 F (37.3 C)] 98.7 F (37.1 C) (06/20 0631) Pulse Rate:  [78-92] 80 (06/20 0631) Resp:  [13-22] 20 (06/20 0631) BP: (135-177)/(53-76) 177/70 mmHg (06/20 0631) SpO2:  [91 %-96 %] 94 % (06/20 0631)  Intake/Output from previous day: 06/19 0701 - 06/20 0700 In: 2000 [I.V.:2000] Out: 1630 [Urine:1630] Intake/Output this shift: Total I/O In: 240 [P.O.:240] Out: -   Abdomen soft nontender palpable femoral pulses bilaterally  Lab Results:  Recent Labs  03/10/14 1206 03/11/14 0439  WBC 9.8 13.6*  HGB 9.4* 10.0*  HCT 28.9* 31.4*  PLT 162 211   BMET  Recent Labs  03/10/14 1206 03/11/14 0439  NA 142 141  K 4.0 3.8  CL 105 103  CO2 25 26  GLUCOSE 171* 155*  BUN 13 10  CREATININE 0.78 0.76  CALCIUM 8.4 8.6    Studies/Results: Dg Chest 2 View  03/04/2014   CLINICAL DATA:  Hypertension  EXAM: CHEST  2 VIEW  COMPARISON:  None.  FINDINGS: There is no edema or consolidation. Heart is upper normal in size with normal pulmonary vascularity. No adenopathy. There is atherosclerotic change in aorta. There is also calcification in the left anterior descending and right coronary arteries. There is evidence of an old healed fracture of the left clavicle with remodeling. There is wedging of the T10 vertebral body.  IMPRESSION: No edema or consolidation. Radiographically apparent coronary artery calcification. There is anterior wedging of the T10 vertebral body, age uncertain. Old left clavicle fracture present.   Electronically Signed   By: Lowella Grip M.D.   On: 03/04/2014 15:55   Nm Myocar Single W/spect W/wall Motion And Ef  03/04/2014   CLINICAL DATA:  68 year old male with a history of chronic mesenteric ischemia with SMA and IMA occlusion, being considered for aorta  to SMA bypass and possible aortofemoral bypass by Dr. Kellie Simmering. This study was requested to evaluate for the presence and extent of ischemia.  EXAM: MYOCARDIAL IMAGING WITH SPECT (REST AND PHARMACOLOGIC-STRESS)  GATED LEFT VENTRICULAR WALL MOTION STUDY  LEFT VENTRICULAR EJECTION FRACTION  TECHNIQUE: Standard myocardial SPECT imaging was performed after resting intravenous injection of 10 mCi Tc-81m sestamibi. Subsequently, intravenous infusion of Lexiscan was performed under the supervision of the Cardiology staff. At peak effect of the drug, 30 mCi Tc-26m sestamibi was injected intravenously and standard myocardial SPECT imaging was performed. Quantitative gated imaging was also performed to evaluate left ventricular wall motion, and estimate left ventricular ejection fraction.  FINDINGS: Baseline tracing shows sinus bradycardia at 47 beats per min with poor R wave progression. Lexiscan bolus was given in standard fashion. Heart rate increased from 47 beats per min up 61 beats per min, and blood pressure increased from 141/63 up to 172/65. No chest pain was reported. There were no diagnostic ST segment abnormalities, and no arrhythmias were noted.  Analysis of the overall perfusion data finds diaphragmatic attenuation.  Tomographic views were obtained using the short axis, vertical long axis, and horizontal long axis planes. There is a moderate sized, mild to moderate intensity, fixed inferior defect that is more prominent at rest than stress, and consistent with soft tissue attenuation rather than scar. No large ischemic zones are noted.  Gated imaging reveals an EDV of 114, ESV of 49, TID ratio 1.16, and LVEF of  57% with normal wall motion.  IMPRESSION: Low risk Lexiscan Cardiolite. There were no diagnostic ST segment abnormalities or arrhythmias. Perfusion imaging is most consistent with soft tissue attenuation affecting the inferior wall, no definitive scar or ischemia. LVEF is normal at 57% with normal volumes  and wall motion.   Electronically Signed   By: Rozann Lesches M.D.   On: 03/04/2014 14:20   Dg Chest Portable 1 View  03/11/2014   CLINICAL DATA:  post op  EXAM: PORTABLE CHEST - 1 VIEW  COMPARISON:  Portable chest radiograph 03/10/2014.  FINDINGS: Cardiac silhouette is enlarged. Support lines and tubes stable. Atherosclerotic calcifications in the aorta. Minimal atelectasis within the lung bases. No acute osseous abnormalities. Old left clavicle fracture appreciated.  IMPRESSION: Minimal atelectasis within the lung bases otherwise stable chest radiograph.   Electronically Signed   By: Margaree Mackintosh M.D.   On: 03/11/2014 08:03   Dg Chest Portable 1 View  03/10/2014   CLINICAL DATA:  Line placement.  EXAM: PORTABLE CHEST - 1 VIEW  COMPARISON:  March 04, 2014.  FINDINGS: Stable cardiomediastinal silhouette. Nasogastric tube is seen passing through esophagus into stomach. Interval placement of right internal jugular catheter with distal tip in expected position of SVC. No pneumothorax is noted. No acute pulmonary disease is noted. Old left clavicular fracture is noted.  IMPRESSION: Interval placement of right internal jugular catheter with distal tip in expected position of the SVC. No pneumothorax is noted. Nasogastric tube placement is also noted. These results were called by telephone at the time of interpretation on 03/10/2014 at 1:00 PM to [facility], who verbally acknowledged these results.   Electronically Signed   By: Sabino Dick M.D.   On: 03/10/2014 13:00   Dg Abd Portable 1v  03/10/2014   CLINICAL DATA:  SMA occlusion  EXAM: PORTABLE ABDOMEN - 1 VIEW  COMPARISON:  01/26/2014  FINDINGS: Postsurgical changes are noted. A nasogastric catheter is seen within the stomach. Diffuse aortic calcifications are noted. No free air is seen.  IMPRESSION: Postoperative change without acute abnormality.   Electronically Signed   By: Inez Catalina M.D.   On: 03/10/2014 12:56   Anti-infectives: Anti-infectives    Start     Dose/Rate Route Frequency Ordered Stop   03/10/14 0552  dextrose 5 % with cefUROXime (ZINACEF) ADS Med    Comments:  Ancil Boozer  : cabinet override      03/10/14 0552 03/10/14 1759   03/09/14 1415  cefUROXime (ZINACEF) 1.5 g in dextrose 5 % 50 mL IVPB  Status:  Discontinued     1.5 g 100 mL/hr over 30 Minutes Intravenous 30 min pre-op 03/09/14 1415 03/10/14 1436      Assessment/Plan: s/p Procedure(s): AORTO TO SUPERIOR MESENTERIC ARTERY BYPASS (N/A) Stable overall. We'll continue to mobilize. We'll DC central line. Will slowly advance diet as tolerated. If they can void without difficulty. If not will have Foley replaced   LOS: 3 days   EARLY, TODD 03/13/2014, 10:25 AM

## 2014-03-13 NOTE — Progress Notes (Signed)
Physical Therapy Treatment Patient Details Name: Connor Bryant MRN: [id number] DOB: [birth year] Today's Date: 04-01-2014    History of Present Illness Pt adm with mesenteric ischemia secondary to occlusion of superior mesenteric artery and underwent AORTO TO SUPERIOR MESENTERIC ARTERY BYPASS on 03/10/14.    PT Comments    Patient making gains with mobility and gait.  Follow Up Recommendations  No PT follow up     Equipment Recommendations  Rolling walker with 5" wheels    Recommendations for Other Services       Precautions / Restrictions Precautions Precautions: Fall Restrictions Weight Bearing Restrictions: No    Mobility  Bed Mobility Overal bed mobility: Needs Assistance Bed Mobility: Supine to Sit;Sit to Supine     Supine to sit: Supervision Sit to supine: Min assist   General bed mobility comments: Assist to bring LE's onto bed to return to supine.  Transfers Overall transfer level: Needs assistance Equipment used: Rolling walker (2 wheeled) Transfers: Sit to/from Stand Sit to Stand: Min guard         General transfer comment: v/c for hand placement  Ambulation/Gait Ambulation/Gait assistance: Min guard Ambulation Distance (Feet): 210 Feet Assistive device: Rolling walker (2 wheeled) Gait Pattern/deviations: Step-through pattern;Decreased stride length Gait velocity: decr Gait velocity interpretation: Below normal speed for age/gender General Gait Details: Assist for safety/balance   Stairs            Wheelchair Mobility    Modified Rankin (Stroke Patients Only)       Balance                                    Cognition Arousal/Alertness: Awake/alert Behavior During Therapy: WFL for tasks assessed/performed Overall Cognitive Status: Within Functional Limits for tasks assessed                      Exercises      General Comments        Pertinent Vitals/Pain Pain 7/10 with mobility. RN notified.     Home Living                      Prior Function            PT Goals (current goals can now be found in the care plan section) Progress towards PT goals: Progressing toward goals    Frequency  Min 3X/week    PT Plan Current plan remains appropriate    Co-evaluation             End of Session Equipment Utilized During Treatment: Gait belt Activity Tolerance: Patient tolerated treatment well;Patient limited by pain Patient left: in bed;with call bell/phone within reach     Time: 1449-1513 PT Time Calculation (min): 24 min  Charges:  $Gait Training: 23-37 mins                    G Codes:      Despina Pole 04/01/14, 6:12 PM Carita Pian. Sanjuana Kava, [place] Pager [phone]

## 2014-03-14 LAB — TYPE AND SCREEN
ABO/RH(D): O POS
Antibody Screen: NEGATIVE
UNIT DIVISION: 0
UNIT DIVISION: 0
UNIT DIVISION: 0
UNIT DIVISION: 0
Unit division: 0
Unit division: 0

## 2014-03-14 LAB — GLUCOSE, CAPILLARY
GLUCOSE-CAPILLARY: 137 mg/dL — AB (ref 70–99)
Glucose-Capillary: 134 mg/dL — ABNORMAL HIGH (ref 70–99)
Glucose-Capillary: 136 mg/dL — ABNORMAL HIGH (ref 70–99)
Glucose-Capillary: 150 mg/dL — ABNORMAL HIGH (ref 70–99)
Glucose-Capillary: 172 mg/dL — ABNORMAL HIGH (ref 70–99)

## 2014-03-14 MED ORDER — PANTOPRAZOLE SODIUM 40 MG PO TBEC
40.0000 mg | DELAYED_RELEASE_TABLET | Freq: Every day | ORAL | Status: DC
  Administered 2014-03-15 – 2014-03-17 (×3): 40 mg via ORAL
  Filled 2014-03-14 (×3): qty 1

## 2014-03-14 MED ORDER — INSULIN ASPART 100 UNIT/ML ~~LOC~~ SOLN
0.0000 [IU] | Freq: Three times a day (TID) | SUBCUTANEOUS | Status: DC
  Administered 2014-03-15: 2 [IU] via SUBCUTANEOUS
  Administered 2014-03-15: 3 [IU] via SUBCUTANEOUS
  Administered 2014-03-15 – 2014-03-17 (×4): 2 [IU] via SUBCUTANEOUS

## 2014-03-14 NOTE — Progress Notes (Signed)
Subjective: Interval History: none.. did well with liquids and is now advancing diet. He is having flatus no bowel movement yet. Mild discomfort  Objective: Vital signs in last 24 hours: Temp:  [99.7 F (37.6 C)-100.2 F (37.9 C)] 99.7 F (37.6 C) (06/21 0328) Pulse Rate:  [89-93] 89 (06/21 0328) Resp:  [18] 18 (06/21 0328) BP: (160-185)/(57-80) 160/58 mmHg (06/21 0328) SpO2:  [96 %-97 %] 97 % (06/21 0328)  Intake/Output from previous day: 06/20 0701 - 06/21 0700 In: 720 [P.O.:720] Out: 1450 [Urine:1450] Intake/Output this shift:    Abdomen soft nontender. Palpable distal pulses  Lab Results: No results found for this basename: WBC, HGB, HCT, PLT,  in the last 72 hours BMET No results found for this basename: NA, K, CL, CO2, GLUCOSE, BUN, CREATININE, CALCIUM,  in the last 72 hours  Studies/Results: Dg Chest 2 View  03/04/2014   CLINICAL DATA:  Hypertension  EXAM: CHEST  2 VIEW  COMPARISON:  None.  FINDINGS: There is no edema or consolidation. Heart is upper normal in size with normal pulmonary vascularity. No adenopathy. There is atherosclerotic change in aorta. There is also calcification in the left anterior descending and right coronary arteries. There is evidence of an old healed fracture of the left clavicle with remodeling. There is wedging of the T10 vertebral body.  IMPRESSION: No edema or consolidation. Radiographically apparent coronary artery calcification. There is anterior wedging of the T10 vertebral body, age uncertain. Old left clavicle fracture present.   Electronically Signed   By: Lowella Grip M.D.   On: 03/04/2014 15:55   Nm Myocar Single W/spect W/wall Motion And Ef  03/04/2014   CLINICAL DATA:  68 year old male with a history of chronic mesenteric ischemia with SMA and IMA occlusion, being considered for aorta to SMA bypass and possible aortofemoral bypass by Dr. Kellie Simmering. This study was requested to evaluate for the presence and extent of ischemia.  EXAM:  MYOCARDIAL IMAGING WITH SPECT (REST AND PHARMACOLOGIC-STRESS)  GATED LEFT VENTRICULAR WALL MOTION STUDY  LEFT VENTRICULAR EJECTION FRACTION  TECHNIQUE: Standard myocardial SPECT imaging was performed after resting intravenous injection of 10 mCi Tc-68m sestamibi. Subsequently, intravenous infusion of Lexiscan was performed under the supervision of the Cardiology staff. At peak effect of the drug, 30 mCi Tc-6m sestamibi was injected intravenously and standard myocardial SPECT imaging was performed. Quantitative gated imaging was also performed to evaluate left ventricular wall motion, and estimate left ventricular ejection fraction.  FINDINGS: Baseline tracing shows sinus bradycardia at 47 beats per min with poor R wave progression. Lexiscan bolus was given in standard fashion. Heart rate increased from 47 beats per min up 61 beats per min, and blood pressure increased from 141/63 up to 172/65. No chest pain was reported. There were no diagnostic ST segment abnormalities, and no arrhythmias were noted.  Analysis of the overall perfusion data finds diaphragmatic attenuation.  Tomographic views were obtained using the short axis, vertical long axis, and horizontal long axis planes. There is a moderate sized, mild to moderate intensity, fixed inferior defect that is more prominent at rest than stress, and consistent with soft tissue attenuation rather than scar. No large ischemic zones are noted.  Gated imaging reveals an EDV of 114, ESV of 49, TID ratio 1.16, and LVEF of 57% with normal wall motion.  IMPRESSION: Low risk Lexiscan Cardiolite. There were no diagnostic ST segment abnormalities or arrhythmias. Perfusion imaging is most consistent with soft tissue attenuation affecting the inferior wall, no definitive scar or ischemia. LVEF  is normal at 57% with normal volumes and wall motion.   Electronically Signed   By: Rozann Lesches M.D.   On: 03/04/2014 14:20   Dg Chest Portable 1 View  03/11/2014   CLINICAL  DATA:  post op  EXAM: PORTABLE CHEST - 1 VIEW  COMPARISON:  Portable chest radiograph 03/10/2014.  FINDINGS: Cardiac silhouette is enlarged. Support lines and tubes stable. Atherosclerotic calcifications in the aorta. Minimal atelectasis within the lung bases. No acute osseous abnormalities. Old left clavicle fracture appreciated.  IMPRESSION: Minimal atelectasis within the lung bases otherwise stable chest radiograph.   Electronically Signed   By: Margaree Mackintosh M.D.   On: 03/11/2014 08:03   Dg Chest Portable 1 View  03/10/2014   CLINICAL DATA:  Line placement.  EXAM: PORTABLE CHEST - 1 VIEW  COMPARISON:  March 04, 2014.  FINDINGS: Stable cardiomediastinal silhouette. Nasogastric tube is seen passing through esophagus into stomach. Interval placement of right internal jugular catheter with distal tip in expected position of SVC. No pneumothorax is noted. No acute pulmonary disease is noted. Old left clavicular fracture is noted.  IMPRESSION: Interval placement of right internal jugular catheter with distal tip in expected position of the SVC. No pneumothorax is noted. Nasogastric tube placement is also noted. These results were called by telephone at the time of interpretation on 03/10/2014 at 1:00 PM to [facility], who verbally acknowledged these results.   Electronically Signed   By: Sabino Dick M.D.   On: 03/10/2014 13:00   Dg Abd Portable 1v  03/10/2014   CLINICAL DATA:  SMA occlusion  EXAM: PORTABLE ABDOMEN - 1 VIEW  COMPARISON:  01/26/2014  FINDINGS: Postsurgical changes are noted. A nasogastric catheter is seen within the stomach. Diffuse aortic calcifications are noted. No free air is seen.  IMPRESSION: Postoperative change without acute abnormality.   Electronically Signed   By: Inez Catalina M.D.   On: 03/10/2014 12:56   Anti-infectives: Anti-infectives   Start     Dose/Rate Route Frequency Ordered Stop   03/10/14 0552  dextrose 5 % with cefUROXime (ZINACEF) ADS Med    Comments:  Ancil Boozer  : cabinet override      03/10/14 0552 03/10/14 1759   03/09/14 1415  cefUROXime (ZINACEF) 1.5 g in dextrose 5 % 50 mL IVPB  Status:  Discontinued     1.5 g 100 mL/hr over 30 Minutes Intravenous 30 min pre-op 03/09/14 1415 03/10/14 1436      Assessment/Plan: s/p Procedure(s): AORTO TO SUPERIOR MESENTERIC ARTERY BYPASS (N/A) Stable overall. Advance diet. He had no difficulty with bowel voiding with the Foley out yesterday.   LOS: 4 days   EARLY, TODD 03/14/2014, 9:12 AM

## 2014-03-15 ENCOUNTER — Inpatient Hospital Stay (HOSPITAL_COMMUNITY): Payer: Medicare HMO

## 2014-03-15 LAB — BASIC METABOLIC PANEL
BUN: 8 mg/dL (ref 6–23)
CHLORIDE: 96 meq/L (ref 96–112)
CO2: 26 meq/L (ref 19–32)
CREATININE: 0.75 mg/dL (ref 0.50–1.35)
Calcium: 9.1 mg/dL (ref 8.4–10.5)
GFR calc Af Amer: >90 mL/min (ref >90)
GFR calc non Af Amer: >90 mL/min (ref >90)
GLUCOSE: 161 mg/dL — AB (ref 70–99)
Potassium: 3.7 mEq/L (ref 3.7–5.3)
Sodium: 135 mEq/L — ABNORMAL LOW (ref 137–147)

## 2014-03-15 LAB — GLUCOSE, CAPILLARY
GLUCOSE-CAPILLARY: 162 mg/dL — AB (ref 70–99)
GLUCOSE-CAPILLARY: 117 mg/dL — AB (ref 70–99)
Glucose-Capillary: 153 mg/dL — ABNORMAL HIGH (ref 70–99)

## 2014-03-15 LAB — CBC
HCT: 30.4 % — ABNORMAL LOW (ref 39.0–52.0)
Hemoglobin: 9.8 g/dL — ABNORMAL LOW (ref 13.0–17.0)
MCH: 28 pg (ref 26.0–34.0)
MCHC: 32.2 g/dL (ref 30.0–36.0)
MCV: 86.9 fL (ref 78.0–100.0)
Platelets: 226 10*3/uL (ref 150–400)
RBC: 3.5 MIL/uL — AB (ref 4.22–5.81)
RDW: 14.2 % (ref 11.5–15.5)
WBC: 10.6 10*3/uL — AB (ref 4.0–10.5)

## 2014-03-15 MED ORDER — ENOXAPARIN SODIUM 40 MG/0.4ML ~~LOC~~ SOLN
40.0000 mg | SUBCUTANEOUS | Status: DC
  Administered 2014-03-15 – 2014-03-17 (×3): 40 mg via SUBCUTANEOUS
  Filled 2014-03-15 (×3): qty 0.4

## 2014-03-15 MED ORDER — OXYCODONE HCL 5 MG PO TABS
5.0000 mg | ORAL_TABLET | ORAL | Status: DC | PRN
  Administered 2014-03-15 – 2014-03-17 (×7): 10 mg via ORAL
  Filled 2014-03-15 (×7): qty 2

## 2014-03-15 NOTE — Progress Notes (Signed)
Occupational Therapy Treatment Patient Details Name: Connor Bryant MRN: [id number] DOB: Jan 02, 1946 Today's Date: 03/15/2014    History of present illness Pt adm with mesenteric ischemia secondary to occlusion of superior mesenteric artery and underwent AORTO TO SUPERIOR MESENTERIC ARTERY BYPASS on 03/10/14.   OT comments  Pt progressing toward goals quickly and d/c plan updated to no follow up. Pt has reported possible d/c to son's home this session. Ot to follow acutely.    Follow Up Recommendations  No OT follow up    Equipment Recommendations  None recommended by OT    Recommendations for Other Services      Precautions / Restrictions Precautions Precautions: Fall Restrictions Weight Bearing Restrictions: No       Mobility Bed Mobility Overal bed mobility: Modified Independent Bed Mobility: Supine to Sit;Sit to Supine              Transfers                 General transfer comment: declines OOB. demonstrates squat stand and scooting toward [facility]    Balance                                   ADL                       Lower Body Dressing: Supervision/safety;Sit to/from stand;With adaptive equipment Lower Body Dressing Details (indicate cue type and reason): pt educated on use of AE and reports having these items previously for wife. pt plans to purchase from his personal pharmancy not the hospital. Pt calling personal pharmancy and asking if they could order items stated. Staff reports to patient these items could be ordered               General ADL Comments: Pt completed bed mobility and ae education for LB dressing. Pt declines OOB due to Lt abdominal pain. pt premedicated and no meds due at this time. Pt reports son offered patient to come stay at his home. Pt then reports this is not finalized and unwilling to provide further details. Son will be working and son's girlfriend will be the caregiver. Pt agreed to AE education due  to possible son's girlfriend assistance. pt does not feel comfortable with bathing/ dressing with this caregiver.      Vision                     Perception     Praxis      Cognition   Behavior During Therapy: [facility] for tasks assessed/performed Overall Cognitive Status: Within Functional Limits for tasks assessed                       Extremity/Trunk Assessment               Exercises     Shoulder Instructions       General Comments      Pertinent Vitals/ Pain       Lt abdominal pain- premedicated- return to supine to help with pain level  Home Living                                          Prior Functioning/Environment  Frequency Min 2X/week     Progress Toward Goals  OT Goals(current goals can now be found in the care plan section)  Progress towards OT goals: Progressing toward goals  Acute Rehab OT Goals Patient Stated Goal: Return home OT Goal Formulation: With patient Time For Goal Achievement: 03/26/14 Potential to Achieve Goals: Good ADL Goals Pt Will Perform Lower Body Bathing: with modified independence;sit to/from stand;with adaptive equipment Pt Will Perform Lower Body Dressing: with modified independence;sit to/from stand;with adaptive equipment Pt Will Transfer to Toilet: with modified independence;bedside commode Pt Will Perform Tub/Shower Transfer: Shower transfer;ambulating;with modified independence Additional ADL Goal #1: Pt will complete bed mobilty MOD I with HOB flat no rails  Plan Discharge plan needs to be updated    Co-evaluation                 End of Session     Activity Tolerance Patient tolerated treatment well   Patient Left in bed;with call bell/phone within reach   Nurse Communication Mobility status;Precautions        Time: 1740-8144 OT Time Calculation (min): 23 min  Charges: OT General Charges $OT Visit: 1 Procedure OT Treatments $Self Care/Home  Management : 23-37 mins  Peri Maris 03/15/2014, 9:26 AM Pager: [phone]

## 2014-03-15 NOTE — Progress Notes (Signed)
Physical Therapy Treatment Patient Details Name: Connor Bryant MRN: [id number] DOB: 16-Oct-1945 Today's Date: March 23, 2014    History of Present Illness Pt adm with mesenteric ischemia secondary to occlusion of superior mesenteric artery and underwent AORTO TO SUPERIOR MESENTERIC ARTERY BYPASS on 03/10/14.    PT Comments    Pt making steady progress. Has amb with nursing earlier so now has amb twice.  Follow Up Recommendations  No PT follow up     Equipment Recommendations  Rolling walker with 5" wheels (vs none)    Recommendations for Other Services       Precautions / Restrictions Precautions Precautions: Fall    Mobility  Bed Mobility Overal bed mobility: Modified Independent Bed Mobility: Sidelying to Sit;Sit to Sidelying   Sidelying to sit: Modified independent (Device/Increase time)     Sit to sidelying: Modified independent (Device/Increase time) General bed mobility comments: Incr time  Transfers Overall transfer level: Modified independent   Transfers: Sit to/from Stand Sit to Stand: Modified independent (Device/Increase time)         General transfer comment: Incr time and effort  Ambulation/Gait Ambulation/Gait assistance: Supervision Ambulation Distance (Feet): 250 Feet Assistive device: None (wall rail at times) Gait Pattern/deviations: Step-through pattern;Decreased stride length;Trunk flexed Gait velocity: decr Gait velocity interpretation: Below normal speed for age/gender General Gait Details: Pt reaching for wall rail at times for reassurance. Splinting incision with hand at times.   Stairs            Wheelchair Mobility    Modified Rankin (Stroke Patients Only)       Balance     Sitting balance-Leahy Scale: Good     Standing balance support: No upper extremity supported Standing balance-Leahy Scale: Fair                      Cognition Arousal/Alertness: Awake/alert Behavior During Therapy: WFL for tasks  assessed/performed Overall Cognitive Status: Within Functional Limits for tasks assessed                      Exercises      General Comments        Pertinent Vitals/Pain Incisional soreness.    Home Living                      Prior Function            PT Goals (current goals can now be found in the care plan section) Progress towards PT goals: Progressing toward goals    Frequency  Min 3X/week    PT Plan Current plan remains appropriate    Co-evaluation             End of Session   Activity Tolerance: Patient tolerated treatment well Patient left: in bed;with call bell/phone within reach     Time: 1455-1508 PT Time Calculation (min): 13 min  Charges:  $Gait Training: 8-22 mins                    G Codes:      MAYCOCK,CARY 03/23/2014, 3:29 PM  [facility] PT [phone]

## 2014-03-15 NOTE — Care Management Note (Unsigned)
Page 1 of 1   03/15/2014     1:40:59 PM CARE MANAGEMENT NOTE 03/15/2014  Patient:  Connor Bryant,Connor Bryant   Account Number:  [id number]  Date Initiated:  03/15/2014  Documentation initiated by:  ,  Subjective/Objective Assessment:   Pt s/p aorto to superior mesenteric artery bypass on 03/10/14.  PTA, pt lives at home alone and is independent. Wife is currently residing in a nursing facility, per report.     Action/Plan:   Met with pt to discuss dc plans.  Pt states he may be able to stay with his son for a while at dc; encouraged this. PT/OT recommending no OP follow up.   Anticipated DC Date:  03/16/2014   Anticipated DC Plan:  HOME/SELF CARE      DC Planning Services  CM consult      Choice offered to / List presented to:             Status of service:  In process, will continue to follow Medicare Important Message given?  YES (If response is "NO", the following Medicare IM given date fields will be blank) Date Medicare IM given:  03/15/2014 Date Additional Medicare IM given:    Discharge Disposition:    Per UR Regulation:  Reviewed for med. necessity/level of care/duration of stay  If discussed at Long Length of Stay Meetings, dates discussed:    Comments:    

## 2014-03-15 NOTE — Progress Notes (Addendum)
  [facility] Progress Note  03/15/2014 7:47 AM 5 Days Post-Op  CC: abdominal pain, lack of mobilization Subjective:  Patient tolerating regular diet. Denies nausea/vomiting. Having abdominal pain well-controlled with dilaudid. Says he is not ready to go home yet because he has not been getting up much and still having abdominal pain. Says he lives at home and will not have assistance. He says he may be able to stay at his son's house. Passing flatus. No bowel movement. Continuing to void well.   Tmax 100.4 BP sys 150s-210s 02 99%  Filed Vitals:   03/15/14 0601  BP: 167/64  Pulse: 87  Temp: 99.7 F (37.6 C)  Resp: 18    Physical Exam: Heart: RRR Lungs: right basilar crackles Incisions:  Midline abdominal staple line clean dry and intact.  Abdomen: +bowel sounds, soft, mild tenderness to deep palpation Extremities: DP pulses palpable bilaterally   CBC    Component Value Date/Time   WBC 13.6* 03/11/2014 0439   RBC 3.57* 03/11/2014 0439   HGB 10.0* 03/11/2014 0439   HCT 31.4* 03/11/2014 0439   PLT 211 03/11/2014 0439   MCV 88.0 03/11/2014 0439   MCH 28.0 03/11/2014 0439   MCHC 31.8 03/11/2014 0439   RDW 14.5 03/11/2014 0439   LYMPHSABS 2.9 12/18/2013 1006   MONOABS 0.7 12/18/2013 1006   EOSABS 1.4* 12/18/2013 1006   BASOSABS 0.1 12/18/2013 1006    BMET    Component Value Date/Time   NA 141 03/11/2014 0439   K 3.8 03/11/2014 0439   CL 103 03/11/2014 0439   CO2 26 03/11/2014 0439   GLUCOSE 155* 03/11/2014 0439   BUN 10 03/11/2014 0439   CREATININE 0.76 03/11/2014 0439   CALCIUM 8.6 03/11/2014 0439   GFRNONAA >90 03/11/2014 0439   GFRAA >90 03/11/2014 0439    INR    Component Value Date/Time   INR 1.11 03/10/2014 1206     Intake/Output Summary (Last 24 hours) at 03/15/14 0747 Last data filed at 03/15/14 0743  Gross per 24 hour  Intake   1920 ml  Output   1350 ml  Net    570 ml     Assessment:  68 y.o. male is s/p:  AORTO TO SUPERIOR MESENTERIC ARTERY  BYPASS 5 Days Post-Op  Plan: -Fever of 100.4 yesterday evening, 99.7 this morning. Crackles on lung ausculation. Ordered CXR and labs.  -Discontinue dilaudid; switch to po pain meds. Morphine for breakthrough pain -FEN: d/c IV fluids, patient with adequate fluid intake. Continue regular diet. Bowel regimen.  -PT for increasing mobilization. -Encourage IS -DVT prophylaxis:  Lovenox, SCDs    Virgina Jock, PA-C [facility] Office: [phone] Pager: [phone] 03/15/2014 7:47 AM     I have examined the patient, reviewed and agree with above. Patient relates left lower quadrant pain that he feels is related to straining on getting out of bed. No evidence of disruption of his midline incision. No tenderness. Slow return of appetite. Flatus with no bowel movement. Continue to mobilize., In one to 2 days once bowels are functioning and soreness is resolved  EARLY, TODD, MD 03/15/2014 10:07 AM

## 2014-03-16 LAB — GLUCOSE, CAPILLARY
GLUCOSE-CAPILLARY: 108 mg/dL — AB (ref 70–99)
GLUCOSE-CAPILLARY: 131 mg/dL — AB (ref 70–99)
Glucose-Capillary: 104 mg/dL — ABNORMAL HIGH (ref 70–99)
Glucose-Capillary: 123 mg/dL — ABNORMAL HIGH (ref 70–99)
Glucose-Capillary: 125 mg/dL — ABNORMAL HIGH (ref 70–99)
Glucose-Capillary: 127 mg/dL — ABNORMAL HIGH (ref 70–99)
Glucose-Capillary: 129 mg/dL — ABNORMAL HIGH (ref 70–99)
Glucose-Capillary: 138 mg/dL — ABNORMAL HIGH (ref 70–99)

## 2014-03-16 MED ORDER — HYDROCODONE-ACETAMINOPHEN 5-325 MG PO TABS: 1.0000 | ORAL_TABLET | Freq: Four times a day (QID) | ORAL | Status: DC | PRN

## 2014-03-16 MED ORDER — BISACODYL 10 MG RE SUPP
10.0000 mg | Freq: Once | RECTAL | Status: AC
  Administered 2014-03-16: 10 mg via RECTAL
  Filled 2014-03-16: qty 1

## 2014-03-16 NOTE — Progress Notes (Signed)
Physical Therapy Treatment Patient Details Name: JAXEN SAMPLES MRN: [id number] DOB: 1946/07/29 Today's Date: 03/16/2014    History of Present Illness Pt adm with mesenteric ischemia secondary to occlusion of superior mesenteric artery and underwent AORTO TO SUPERIOR MESENTERIC ARTERY BYPASS on 03/10/14.    PT Comments    Pt is steadier with RW.  He reports he has one at home that he will pick up before going to son's house.  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  Pt verbalized understanding, but stated he thinks some of his reaching for rails is the medicine.  Follow Up Recommendations  No PT follow up     Equipment Recommendations  None recommended by PT (Pt reports he does have RW at home.)    Recommendations for Other Services       Precautions / Restrictions      Mobility  Bed Mobility Overal bed mobility: Modified Independent                Transfers Overall transfer level: Modified independent                  Ambulation/Gait Ambulation/Gait assistance: Supervision Ambulation Distance (Feet): 300 Feet Assistive device: Rolling walker (2 wheeled) (and none for short distance) Gait Pattern/deviations: Step-through pattern     General Gait Details: Pt with normalized gait pattern with RW vs no AD. Without ad, he reaches out for rails and decreases step length.   Stairs Stairs: Yes Stairs assistance: Min guard Stair Management: One rail Right (and HHA) Number of Stairs: 8 General stair comments: Step through pattern with ascent and step to pattern with descent.  HHA given.  Wheelchair Mobility    Modified Rankin (Stroke Patients Only)       Balance     Sitting balance-Leahy Scale: Good     Standing balance support: No upper extremity supported Standing balance-Leahy Scale: Fair                      Cognition                            Exercises      General Comments         Pertinent Vitals/Pain No reports of pain, HR WNL    Home Living                      Prior Function            PT Goals (current goals can now be found in the care plan section) Progress towards PT goals: Progressing toward goals    Frequency  Min 3X/week    PT Plan Current plan remains appropriate    Co-evaluation             End of Session Equipment Utilized During Treatment: Gait belt Activity Tolerance: Patient tolerated treatment well Patient left: in bed;with call bell/phone within reach     Time: 9381-0175 PT Time Calculation (min): 15 min  Charges:  $Gait Training: 8-22 mins                    G Codes:      SMITH,KAREN LUBECK 03/16/2014, 12:04 PM

## 2014-03-16 NOTE — Progress Notes (Addendum)
  [facility] Progress Note  03/16/2014 7:32 AM 6 Days Post-Op  Subjective:  Patient is not ready to go home today. He wants more physical therapy work and his son won't be able to pick him up until tomorrow. Has been ambulating in the halls. Denies any pain. No bowel movement yet. Passing flatus. No nausea/vomiting.   Tmax 99.2 BP sys 160s-190s 02 95% RA  Filed Vitals:   03/16/14 0416  BP: 174/64  Pulse: 91  Temp: 99.2 F (37.3 C)  Resp: 18    Physical Exam: Cardiac: RRR, no m/g/r Pulmonary: clear to ausculation bilaterally  Abdomen: positive bowel sounds, soft, non-tender to palpation Incisions:  Midline laparotomy staple line c/d/i Extremities: palpable dorsalis pedis pulses bilaterally  CBC    Component Value Date/Time   WBC 10.6* 03/15/2014 0905   RBC 3.50* 03/15/2014 0905   HGB 9.8* 03/15/2014 0905   HCT 30.4* 03/15/2014 0905   PLT 226 03/15/2014 0905   MCV 86.9 03/15/2014 0905   MCH 28.0 03/15/2014 0905   MCHC 32.2 03/15/2014 0905   RDW 14.2 03/15/2014 0905   LYMPHSABS 2.9 12/18/2013 1006   MONOABS 0.7 12/18/2013 1006   EOSABS 1.4* 12/18/2013 1006   BASOSABS 0.1 12/18/2013 1006    BMET    Component Value Date/Time   NA 135* 03/15/2014 0905   K 3.7 03/15/2014 0905   CL 96 03/15/2014 0905   CO2 26 03/15/2014 0905   GLUCOSE 161* 03/15/2014 0905   BUN 8 03/15/2014 0905   CREATININE 0.75 03/15/2014 0905   CALCIUM 9.1 03/15/2014 0905   GFRNONAA >90 03/15/2014 0905   GFRAA >90 03/15/2014 0905    INR    Component Value Date/Time   INR 1.11 03/10/2014 1206     Intake/Output Summary (Last 24 hours) at 03/16/14 0732 Last data filed at 03/16/14 0416  Gross per 24 hour  Intake    240 ml  Output    875 ml  Net   -635 ml     Assessment:  68 y.o. male is s/p:  Aorto to superior mesenteric artery bypass  6 Days Post-Op  Plan: -CXR with no acute findings yesterday. WBC within normal limits. -Patient doing well on oral pain medication. Has not needed  anything since 2300 yesterday. Encouraged minimal pain medication due to constipation.  -PT/OT evaluations yesterday recommended d/c home without outpatient follow-up.  -Patient is not ready to go home until tomorrow when his son can pick him up. He wants additional physical therapy work before discharge as well. -Dispo: Anticipate discharge home tomorrow.  -DVT prophylaxis:  Lovenox    Virgina Jock, PA-C [facility] Office: [phone] Pager: [phone] 03/16/2014 7:32 AM     I have examined the patient, reviewed and agree with above. Passing gas but still no bowel movement. Will give Dulcolax suppository. Plan discharge tomorrow.  Hashim Eichhorst, MD 03/16/2014 8:06 AM

## 2014-03-16 NOTE — Discharge Summary (Signed)
[facility]  Discharge Summary  Connor Bryant 1946/02/07 68 y.o. male  [id number]  Admission Date: 03/10/2014  Discharge Date: 03/17/2014  Physician: [name], MD  Admission Diagnosis: Mesenteric ischemia with total occlusion of superior mesenteric artery Diffuse aortoiliac occlusive disease with no total occlusion and no claudication symptoms  HPI:   This is a 68 y.o. male who was referred by Dr. Carlean Purl for evaluation of possible mesenteric ischemia. Patient has been having diarrhea intermittently for the past 8 months ever since he retired in August of 2014. Since that time he has lost 25 pounds while not dieting. He does not complain of postprandial pain but does have some bloating sensation in his lower abdomen. There is no blood in the diarrhea. He has no history of CVA, coronary artery disease with MI, or lower extremity claudication. He does have a heavy history of tobacco abuse 50+ pack years. He has a family history of vascular disease as well. He had a CT scan recently which revealed possible occlusion of the SMA with stenosis of the celiac axis and possible occlusion or stenosis of the left renal artery. He had mesenteric angiography performed by Dr. Bridgett Larsson on 02/23/14 which revealed total occlusion of the superior mesenteric artery and inferior mesenteric artery, but no significant stenosis in the celiac axis or renal arteries. He was recommended aorto to SMA bypass with possible aortobifemoral bypass if needed.    Hospital Course:  The patient was admitted to the hospital and taken to the operating room on 03/10/2014 and underwent: aorto to superior mesenteric artery bypass using 6 mm Hemashield Dacron graft. The pt tolerated the procedure well and was transported to the PACU in good condition. He had an NG tube in place.  On POD 1, his NG tube was discontinued. He had no nausea or vomiting. He was kept NPO. He had no flatus or bowel movements. His  hemoglobin and hematocrit were stable.His foley catheter was discontinued. He had excellent dorsalis pedis pulses bilaterally.  On POD 2, his foley catheter was reinserted due to inability to void.  He was transferred to the floor. He was kept NPO. No flatus or bowel movement.   On POD 3, he was advanced to clear liquids and tolerated them with no nausea or vomiting. His foley catheter was discontinued today. He had been mobilizing.   On POD 4, his diet was advanced and he tolerated it well.   On POD 5, he had a low grade fever and a chest x-ray and cbc were ordered. They were both normal. He continued to do well with an advanced diet. He still had no bowel movement.  On POD 6, he was ready to be discharged but did not want to proceed because his son was not able to pick him up until the following day. He continued to ambulate well and tolerate his diet. He was able to have a bowel movement today.   The remainder of the hospital course consisted of increasing mobilization and increasing intake of solids without difficulty. He was discharged on POD 7.  CBC    Component Value Date/Time   WBC 10.6* 03/15/2014 0905   RBC 3.50* 03/15/2014 0905   HGB 9.8* 03/15/2014 0905   HCT 30.4* 03/15/2014 0905   PLT 226 03/15/2014 0905   MCV 86.9 03/15/2014 0905   MCH 28.0 03/15/2014 0905   MCHC 32.2 03/15/2014 0905   RDW 14.2 03/15/2014 0905   LYMPHSABS 2.9 12/18/2013 1006   MONOABS  0.7 12/18/2013 1006   EOSABS 1.4* 12/18/2013 1006   BASOSABS 0.1 12/18/2013 1006    BMET    Component Value Date/Time   NA 135* 03/15/2014 0905   K 3.7 03/15/2014 0905   CL 96 03/15/2014 0905   CO2 26 03/15/2014 0905   GLUCOSE 161* 03/15/2014 0905   BUN 8 03/15/2014 0905   CREATININE 0.75 03/15/2014 0905   CALCIUM 9.1 03/15/2014 0905   GFRNONAA >90 03/15/2014 0905   GFRAA >90 03/15/2014 0905     Discharge Instructions:   The patient is discharged to home with extensive instructions on wound care and progressive ambulation.  They  are instructed not to drive or perform any heavy lifting until returning to see the physician in his office.    Discharge Diagnosis:  #1 chronic mesenteric ischemia due to superior mesenteric artery occlusion #2 anticipated postop blood loss anemia #3 diffuse aortoiliac occlusive disease-no total occlusions and no symptoms of claudication Secondary Diagnosis: Patient Active Problem List   Diagnosis Date Noted  . Mesenteric ischemia 03/10/2014  . Chronic mesenteric ischemia 03/01/2014  . Peripheral vascular disease, unspecified 02/16/2014  . 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 Personal history of colonic polyp-adenoma 11/24/2013  . Diarrhea 10/21/2013  . Loss of weight 10/21/2013   Past Medical History  Diagnosis Date  . Thoracic spine fracture   . HTN (hypertension)   . DM type 2 (diabetes mellitus, type 2)     no meds  . Hyperlipidemia   . Renal insufficiency     mild  . Personal history of colonic polyp-adenoma 11/24/2013  . Gallstone   . Heart murmur     was told one time he had a murmur, not since 30 years ago  . Arthritis        Medication List    ASK your doctor about these medications       allopurinol 100 MG tablet  Commonly known as:  ZYLOPRIM  Take 100 mg by mouth daily.     aspirin 81 MG tablet  Take 81 mg by mouth daily.     atorvastatin 20 MG tablet  Commonly known as:  LIPITOR  Take 20 mg by mouth daily.     colestipol 5 G granules  Commonly known as:  COLESTID  Take 5 g by mouth daily. With supper     hydrochlorothiazide 25 MG tablet  Commonly known as:  HYDRODIURIL  Take 25 mg by mouth daily.     loperamide 2 MG capsule  Commonly known as:  IMODIUM  Take by mouth as needed for diarrhea or loose stools.     metoprolol succinate 100 MG 24 hr tablet  Commonly known as:  TOPROL-XL  Take 100 mg by mouth daily. Take with or immediately following a meal.     trandolapril 4 MG tablet  Commonly known as:  MAVIK    Take 4 mg by mouth daily.     verapamil 240 MG (CO) 24 hr tablet  Commonly known as:  COVERA HS  Take 480 mg by mouth daily.        Roxicodone #30 No Refill  Disposition: Home  Patient's condition: is Good  Follow up: 1. Dr. Kellie Simmering in 2 weeks    Virgina Jock, PA-C [facility] [phone] 03/16/2014  4:37 PM   - For VQI Registry use ---   Post-op:  Time to Extubation: [x ] In OR, [ ]  < 12 hrs, [ ]   12-24 hrs, [ ]  >=24 hrs Vasopressors Req. Post-op: No ICU Stay: 1 day Transfusion: Yes  If yes, 1 units given MI: No, [ ]  Troponin only, [ ]  EKG or Clinical New Arrhythmia: No  Complications: CHF: No Resp failure: No, [ ]  Pneumonia, [ ]  Ventilator Chg in renal function: No, [ ]  Inc. Cr > 0.5, [ ]  Temp. Dialysis, [ ]  Permanent dialysis Leg ischemia: No, no Surgery needed, [ ]  Yes, Surgery needed, [ ]  Amputation Bowel ischemia: No, [ ]  Medical Rx, [ ]  Surgical Rx Wound complication: No, [ ]  Superficial separation/infection, [ ]  Return to OR Return to OR: No  Return to OR for bleeding: No Stroke: No, [ ]  Minor, [ ]  Major  Discharge medications: Statin use:  No If No: [ ]  For Medical reasons, [ ]  Non-compliant ASA use:  No  If No: [ ]  For Medical reasons, [ ]  Non-compliant Plavix use:  No If No: [ ]  For Medical reasons, [ ]  Non-compliant Beta blocker use:  Yes If No: [ ]  For Medical reasons, [ ]  Non-compliant

## 2014-03-17 ENCOUNTER — Telehealth: Payer: Self-pay | Admitting: Vascular Surgery

## 2014-03-17 LAB — GLUCOSE, CAPILLARY
GLUCOSE-CAPILLARY: 145 mg/dL — AB (ref 70–99)
Glucose-Capillary: 133 mg/dL — ABNORMAL HIGH (ref 70–99)

## 2014-03-17 NOTE — Progress Notes (Signed)
DC IV and tele per MD orders; DC instructions reviewed with pt and son at bedside; no further questions; paper prescription for pain med given to patient.  Carollee Sires, RN

## 2014-03-17 NOTE — Progress Notes (Signed)
  [facility] Progress Note  03/17/2014 8:30 AM 7 Days Post-Op  Subjective:  Patient is doing well today. Is ready to go home. Had two bowel movements yesterday.   Tmax 101.2, 99 this am BP sys 160s-200s 02 98% RA  Filed Vitals:   03/17/14 0700  BP: 177/60  Pulse:   Temp:   Resp:     Physical Exam: Cardiac:  RRR Lungs:  Clear to auscultation bilaterally Abdomen:  Positive bowel sounds, soft, mild tenderness to left lower quadrant Incisions:  Midline staple line c/d/i Extremities:  Palpable DP pulses bilaterally.   CBC    Component Value Date/Time   WBC 10.6* 03/15/2014 0905   RBC 3.50* 03/15/2014 0905   HGB 9.8* 03/15/2014 0905   HCT 30.4* 03/15/2014 0905   PLT 226 03/15/2014 0905   MCV 86.9 03/15/2014 0905   MCH 28.0 03/15/2014 0905   MCHC 32.2 03/15/2014 0905   RDW 14.2 03/15/2014 0905   LYMPHSABS 2.9 12/18/2013 1006   MONOABS 0.7 12/18/2013 1006   EOSABS 1.4* 12/18/2013 1006   BASOSABS 0.1 12/18/2013 1006    BMET    Component Value Date/Time   NA 135* 03/15/2014 0905   K 3.7 03/15/2014 0905   CL 96 03/15/2014 0905   CO2 26 03/15/2014 0905   GLUCOSE 161* 03/15/2014 0905   BUN 8 03/15/2014 0905   CREATININE 0.75 03/15/2014 0905   CALCIUM 9.1 03/15/2014 0905   GFRNONAA >90 03/15/2014 0905   GFRAA >90 03/15/2014 0905    INR    Component Value Date/Time   INR 1.11 03/10/2014 1206     Intake/Output Summary (Last 24 hours) at 03/17/14 0830 Last data filed at 03/16/14 1700  Gross per 24 hour  Intake    120 ml  Output    250 ml  Net   -130 ml     Assessment/Plan:  68 y.o. male is s/p: aorto to SMA bypass 7 Days Post-Op  BP elevated at 203/82 this am at 0401. BP back down to 177/60 with labetalol. Will discharge home today and resume hypertension medications. Patient having bowel movements and continuing to ambulate without difficulty. Palpable DP pulses bilaterally. Continuing to tolerate regular diet. His left lower quadrant pain has been  attributed to getting up out of bed. His incision is intact. He'll follow-up later this week or early next week for staple removal. Follow-up in 1 week with Dr. Kellie Simmering.     Virgina Jock, PA-C [facility] Office: [phone] Pager: [phone] 03/17/2014 8:30 AM

## 2014-03-17 NOTE — Telephone Encounter (Addendum)
Message copied by Gena Fray on Wed Mar 17, 2014 12:02 PM ------      Message from: Mena Goes      Created: Tue Mar 16, 2014 11:57 AM      Regarding: Schedule                   ----- Message -----         From: Alvia Grove, PA-C         Sent: 03/16/2014  10:35 AM           To: [facility] Charge Pool            S/p aorto-sma bypass 03/10/14            Patient needs staple removal (nurse only appt) 6/25 or 6/26            F/u with Dr. Kellie Simmering in 2 weeks.            Thanks,             Kim ------  03/17/14: spoke with pt- he will "try" to get someone to bring him. I explained that we needed to remove his staples this week if possible. dpm

## 2014-03-18 ENCOUNTER — Encounter: Payer: Self-pay | Admitting: Family

## 2014-03-19 ENCOUNTER — Ambulatory Visit: Payer: Medicare HMO | Admitting: Family

## 2014-03-19 ENCOUNTER — Telehealth: Payer: Self-pay

## 2014-03-19 ENCOUNTER — Other Ambulatory Visit: Payer: Self-pay | Admitting: Vascular Surgery

## 2014-03-19 ENCOUNTER — Ambulatory Visit (INDEPENDENT_AMBULATORY_CARE_PROVIDER_SITE_OTHER): Payer: Medicare HMO | Admitting: Family

## 2014-03-19 ENCOUNTER — Encounter: Payer: Self-pay | Admitting: Family

## 2014-03-19 ENCOUNTER — Ambulatory Visit (HOSPITAL_COMMUNITY)
Admission: RE | Admit: 2014-03-19 | Discharge: 2014-03-19 | Disposition: A | Discharge status: Home / Self Care | Payer: Medicare HMO | Source: Ambulatory Visit | Attending: Family | Admitting: Family

## 2014-03-19 VITALS — BP 131/74 | HR 64 | Temp 97.4°F | Resp 16 | Ht 68.0 in | Wt 170.0 lb

## 2014-03-19 DIAGNOSIS — K551 Chronic vascular disorders of intestine: Secondary | ICD-10-CM

## 2014-03-19 DIAGNOSIS — Z48812 Encounter for surgical aftercare following surgery on the circulatory system: Secondary | ICD-10-CM

## 2014-03-19 NOTE — Telephone Encounter (Signed)
Pt. called to report recent episodes of vomiting small amts.  Reported he has abdominal muscle cramps, and just feels like he needs to get rid of something in his stomach;  Stated "it's like the food doesn't want to go down."  Reported he vomited Wednesday in the early AM; then again last night about 1-2 hrs. after eating supper; then vomited, again, early morning, before getting-up, today.  Stated the emesis is very small amt.; stated it doesn't have a bile taste.  Reported that he had a fairly adequate, formed BM, yesterday.  Stated he doesn't have much of an appetite.  Denies fever/ chills.  Discussed with Dr. Bridgett Larsson.  Rec'd new order for Mesenteric Artery Duplex today. Pt. Is already scheduled for staple removal today, per the NP.  Informed to remain NPO; reported hasn't eaten since supper last night, and only drank 1 cup coffee, and 1/2 bottle water this AM.  Appt. given for 2:00 PM for ultrasound.  Verb. Understanding.

## 2014-03-19 NOTE — Progress Notes (Signed)
Established Mesenteric Ischemia  History of Present Illness  Connor Bryant is a 68 y.o. (September 25, 1945) male who is s/p AORTO TO SUPERIOR MESENTERIC ARTERY BYPASS using 6 mm Hemashield Dacron graft on 03/10/2014 by Dr. Kellie Simmering. He returns for mesenteric artery Duplex to evaluate vomiting.  He vomited twice about 10-15 minutes after eating, a third time it was about 5 AM, not after eating. Vomiting started yesterday about 2am. He wonders whether it was the spicy food he ate and partially cooked noodles.  He had no problems after eating toast, a [place] sandwich, and chocolate pudding. He does not have an appetite. He weighed 180 pounds when he went into the hospital 03/10/14, weighs 170 pounds today, discharged 2 days ago.    The patient denies abdominal pain but states he has gas pain.  The patient denies food fear.  He had a formed BM yesterday. He states his A1C's have been 5.5 to 6.2 consistently.  He stopped taking metformin which can cause some nausea.  He takes a daily ASA, statin, and a beta blocker. He denies any history of stroke or TIA, denies any cardiac problems.   Past Medical History  Diagnosis Date  . Thoracic spine fracture   . HTN (hypertension)   . DM type 2 (diabetes mellitus, type 2)     no meds  . Hyperlipidemia   . Renal insufficiency     mild  . Personal history of colonic polyp-adenoma 11/24/2013  . Gallstone   . Heart murmur     was told one time he had a murmur, not since 30 years ago  . Arthritis    Past Surgical History  Procedure Laterality Date  . Debridement tennis elbow Right   . Tonsillectomy and adenoidectomy    . Colonoscopy    . Esophagogastroduodenoscopy    . Mesenteric artery bypass N/A 03/10/2014    Procedure: AORTO TO SUPERIOR MESENTERIC ARTERY BYPASS;  Surgeon: [name], MD;  Location: [facility];  Service: Vascular;  Laterality: N/A;   History   Social History  . Marital Status: Married    Spouse Name: N/A    Number of Children:  N/A  . Years of Education: N/A   Occupational History  . Not on file.   Social History Main Topics  . Smoking status: Former Smoker    Types: Cigarettes    Quit date: 09/24/2008  . Smokeless tobacco: Former Systems developer    Types: Chew     Comment: Pt just tried  for short time  . Alcohol Use: 4.8 oz/week    8 Cans of beer per week     Comment: 8 beers a week  . Drug Use: No  . Sexual Activity: Not on file   Other Topics Concern  . Not on file   Social History Narrative   Patient is married, he is retired. 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   Family History  Problem Relation Age of Onset  . Hypertension Father   . Coronary artery disease Father   . Hyperlipidemia Father   . Diabetes Father   . Bladder Cancer Father   . Cancer Father   . Heart disease Father   . Heart attack Father   . Colon cancer Neg Hx   . Throat cancer Neg Hx   . Kidney disease Neg Hx   . Liver disease Neg Hx   . Diabetes Mother   . Heart disease Mother  before age 56  . Hyperlipidemia Mother   . Hypertension Mother   . AAA (abdominal aortic aneurysm) Mother    Current Outpatient Prescriptions on File Prior to Visit  Medication Sig Dispense Refill  . allopurinol (ZYLOPRIM) 100 MG tablet Take 100 mg by mouth daily.       Marland Kitchen aspirin 81 MG tablet Take 81 mg by mouth daily.      Marland Kitchen atorvastatin (LIPITOR) 20 MG tablet Take 20 mg by mouth daily.      . colestipol (COLESTID) 5 G granules Take 5 g by mouth daily. With supper  500 g  1  . hydrochlorothiazide (HYDRODIURIL) 25 MG tablet Take 25 mg by mouth daily.      Marland Kitchen HYDROcodone-acetaminophen (NORCO) 5-325 MG per tablet Take 1 tablet by mouth every 6 (six) hours as needed for moderate pain.  30 tablet  0  . loperamide (IMODIUM) 2 MG capsule Take by mouth as needed for diarrhea or loose stools.      . metoprolol succinate (TOPROL-XL) 100 MG 24 hr tablet Take 100 mg by mouth daily. Take with or immediately following a meal.      . trandolapril  (MAVIK) 4 MG tablet Take 4 mg by mouth daily.      . verapamil (COVERA HS) 240 MG (CO) 24 hr tablet Take 480 mg by mouth daily.        No current facility-administered medications on file prior to visit.   No Known Allergies  On ROS today: See HPI for pertinent positives and negatives.   Physical Examination  Filed Vitals:   03/19/14 1459  BP: 131/74  Pulse: 64  Temp: 97.4 F (36.3 C)  TempSrc: Oral  Resp: 16  Height: 5\' 8"  (1.727 m)  Weight: 170 lb (77.111 kg)  SpO2: 99%   Body mass index is 25.85 kg/(m^2).  General: A&O x 3, WDWN.  Pulmonary: Sym exp, good air movt, CTAB, no rales, rhonchi, or wheezing.  Cardiac: RRR, Nl S1, S2,  Murmur detected  Vascular: Vessel Right Left  Radial 2+Palpable 2+Palpable  Carotid  with transmitted cardiac murmur With transmitted cardiac murnmur  Aorta  palpable N/A  Femoral 2+Palpable 2+Palpable  Popliteal 2+Not palpable 2+Not palpable  PT 2+Palpable 2+Palpable  DP 2+Palpable 2+Palpable   Gastrointestinal: soft, mild tenderness to palpation post operatively.  Musculoskeletal: M/S 5/5 throughout, Extremities without ischemic changes   Neurologic: Pain and light touch intact in extremities, Motor exam as listed above  Non-Invasive Vascular Imaging  Mesenteric Duplex (Date: 03/19/2014):  MESENTERIC ARTERY DUPLEX EVALUATION    INDICATION: Abdominal Pain, Chronic mesenteric artery ischemia    PREVIOUS INTERVENTION(S): Aorta to mesenteric artery bypass graft 03/10/2014    DUPLEX EXAM:     ARTERY PEAK SYSTOLIC VELOCITY (cm/s) IMAGE  Aorta 71/120   Celiac n/v   Superior Mesenteric Artery - Proximal 225/244 ao-sma graft  Superior Mesenteric Artery - Mid 152/440 ao-sma graft  Inferior Mesenteric Artery n/v   Hepatic    Splenic       ADDITIONAL FINDINGS: Abdominal aorta plaque with elevated velocities present at the mid and distal segments. Bilateral common iliac artery stenosis present of greater than 50%.    IMPRESSION:  Technically difficult evaluation due to presence of staples and bowel gas. Elevated velocities present at/or around the general vicinity of the distal graft anastomosis which are suggestive of hemodynamically significant stenosis. Common iliac artery stenosis present as noted above.    Medical Decision Making  Connor Bryant is a 68  y.o. male who is s/p AORTO TO SUPERIOR MESENTERIC ARTERY BYPASS using 6 mm Hemashield Dacron graft on 03/10/2014 by Dr. Kellie Simmering. He returns for mesenteric artery Duplex to evaluate vomiting. Mesenteric artery Duplex indicates adequate arterial flow to SMA.   Based on her exam and studies, and after discussing with Dr. Bridgett Larsson, I have offered the patient return on Tuesday, June 30 to see Dr. Kellie Simmering and for staples removal.  I discussed in depth with the patient the nature of atherosclerosis, and emphasized the importance of maximal medical management including strict control of blood pressure, blood glucose, and lipid levels, obtaining regular exercise, and cessation of smoking.    The patient is aware that without maximal medical management the underlying atherosclerotic disease process will progress, limiting the benefit of any interventions.  Thank you for allowing [place] to participate in this patient's care.  Clemon Chambers, RN, MSN, FNP-C [facility] [place] Office: [phone]  Clinic MD: Bridgett Larsson  03/19/2014, 2:40 PM

## 2014-03-19 NOTE — Patient Instructions (Signed)
Acute Mesenteric Ischemia Mesentery refers to the tissues that connect the blood vessels to the intestines. Ischemia refers to a restriction in blood supply. Mesenteric ischemia happens when an artery or vein that supports the intestine becomes blocked or narrow. Acute mesenteric ischemia (AMI) happens suddenly and can be life-threatening. CAUSES  When blood supply to the intestine is severely restricted, needed oxygen cannot reach the intestines for proper function. Causes of AMI include:  A blood clot. This may develop due to heart attack, heart failure, or an irregular heartbeat (arrhythmia).  Low blood pressure. This may be due to shock, heart failure, certain medicines, dialysis, or kidney failure. People at the greatest risk for mesenteric ischemia are those over the age of 41 with a history of coronary or vascular disease and people who smoke. SYMPTOMS   Sudden, severe abdominal pain or bloating.  Blood in the stool.  Nausea.  Diarrhea, which is often bloody.  Vomiting.  Fever. DIAGNOSIS  AMI is a medical emergency. Immediate evaluation and treatment is necessary. To confirm a diagnosis of AMI, your caregiver may perform:  A history and physical exam.  X-rays or CT scans.  Blood tests.  Angiography. This imaging test uses a dye to obtain a picture of blood flow to the intestine.  Exploratory laparotomy. This is surgery that opens the abdomen and examines the intestines for signs of tissue death. Dead intestines will need to be removed. TREATMENT  Treatment of AMI almost always means emergency surgery. Patients who need this treatment are very sick and will need to be in the intensive care unit (ICU) after surgery. This is a life-threatening condition. Document Released: 04/30/2011 Document Revised: 12/03/2011 Document Reviewed: 04/30/2011 [facility] : [facility] Patient Information 2015 [place], [place]. This information is not intended to replace advice given to you by your health care  provider. Make sure you discuss any questions you have with your health care provider.   Chronic Mesenteric Ischemia Mesenteric ischemia is a deficiency of blood in an area of the intestine supplied by an artery that supports the intestine. Chronic mesenteric ischemia, also called intestinal angina, is a long-term condition. It happens when an artery or vein that supports the intestine gradually becomes blocked or narrow, restricting the blood supply to the intestine. When the blood supply to the intestine is severely restricted, the intestines cannot function properly because needed oxygen cannot reach them.  CAUSES   Fatty deposits that build up in an artery or vein but have not yet restricted blood flow entirely.  Differences in some people's anatomy.  Rapid weight loss.  Weakened areas in blood vessel walls (aneurysms).  Swelling and inflammation of blood vessels (such as from fibromuscular dysplasia and arteritis).  Disorders of blood clotting.  Scarring and fibrosis of blood vessels after radiation therapy.  Blood vessel problems after drug use, such as use of cocaine. RISK FACTORS  Being male.  Being over age 22 with a history of coronary or vascular disease.  Smoking.  Congestive heart failure.  Diabetes.  High cholesterol.  High blood pressure (hypertension). SIGNS AND SYMPTOMS   Severe stomachache. Some people become fearful of eating because of pain.   Abdominal pain or cramps that develop about 30 minutes after a meal.   Abdominal pain after eating that becomes worse over time.   Diarrhea.   Nausea.   Vomiting.   Bloating.   Weight loss. DIAGNOSIS  Chronic mesenteric ischemia is often diagnosed after the person's history is taken, a physical exam is done, and tests are  taken. Tests may include:  Ultrasounds.  CT scans.  Angiography. This is an imaging test that uses a dye to obtain a picture of blood flow to the  intestine.  Endoscopy. This involves putting a scope through the mouth, down the throat, and into the stomach and intestine to view the intestinal wall and take small tissue samples (biopsies).  Tonometry. In this test a tiny probe is passed through the mouth and into the stomach or intestine and left in place for 24 hours or more. It measures the output of carbon dioxide by the affected tissues. TREATMENT  Treatment may include:   Medicines to reduce blood clotting and increase blood flow.   Surgery to remove the blockage, repair arteries or veins, and restore blood flow. This may involve:   Angioplasty. This is surgery to widen the affected artery, reduce the blockage, and sometimes insert a small, mesh tube (stent).   Bypass surgery. This may be performed to bypass the blockage and reconnect healthy arteries or veins.   A stent in the affected area to help keep blocked arteries open. HOME CARE INSTRUCTIONS  Only take over-the-counter or prescription medicines as directed by your health care provider.   Keep all follow-up appointments as directed by your health care provider.   Prevent the condition from occurring by:  Doing regular exercise.  Keeping a healthy weight.  Keeping a healthy diet.  Managing cholesterol levels.  Keeping blood pressure and heart rhythm problems under control.  Not smoking. SEEK IMMEDIATE MEDICAL CARE IF:  You have severe abdominal pain.   You notice blood in your stool.   You have nausea, vomiting, or diarrhea.   You have a fever. MAKE SURE YOU:  Understand these instructions.  Will watch your condition.  Will get help right away if you are not doing well or get worse. Document Released: 04/30/2011 Document Revised: 05/13/2013 Document Reviewed: 03/11/2013 [facility] Patient Information 2015 [place], [place]. This information is not intended to replace advice given to you by your health care provider. Make sure you discuss any  questions you have with your health care provider.

## 2014-03-22 ENCOUNTER — Encounter: Payer: Self-pay | Admitting: Vascular Surgery

## 2014-03-23 ENCOUNTER — Ambulatory Visit (INDEPENDENT_AMBULATORY_CARE_PROVIDER_SITE_OTHER): Payer: Self-pay | Admitting: Vascular Surgery

## 2014-03-23 ENCOUNTER — Encounter: Payer: Self-pay | Admitting: Vascular Surgery

## 2014-03-23 VITALS — BP 170/83 | HR 89 | Resp 16 | Ht 68.0 in | Wt 167.0 lb

## 2014-03-23 DIAGNOSIS — K551 Chronic vascular disorders of intestine: Secondary | ICD-10-CM

## 2014-03-23 DIAGNOSIS — Z48812 Encounter for surgical aftercare following surgery on the circulatory system: Secondary | ICD-10-CM

## 2014-03-23 NOTE — Addendum Note (Signed)
Addended by: Mena Goes on: 03/23/2014 09:32 AM   Modules accepted: Orders

## 2014-03-23 NOTE — Progress Notes (Signed)
Subjective:     Patient ID: GOKU HARB, male   DOB: 08/11/1946, 68 y.o.   MRN: [id number]  HPI this 68 year old male returns almost 2 weeks post aorta to SMA bypass for chronic mesenteric ischemia due to SMA occlusion. He went home from the hospital 6 days ago and did have some vomiting on Wednesday evening. He was seen in our office on Friday of last week and duplex scan revealed a bypass to be patent with some turbulence near the anastomosis. Patient has had a dramatic improvement in his bowel movements since surgery with no further diarrhea. His weight is 167 compared to 180 preoperatively. He weighed as much as 207 in August of 2014. He states he feels much better and denies any significant pain at this time.   Review of Systems     Objective:   Physical Exam BP 170/83  Pulse 89  Resp 16  Ht 5\' 8"  (1.727 m)  Wt 167 lb (75.751 kg)  BMI 25.40 kg/m2  General well-developed thin male in no apparent stress alert and oriented x3 Lungs rhonchi or wheezing Abdominal incision healed nicely skin staples in place-removed and Steri-Strips Abdomen is soft and no diffuse tenderness or distention noted. 3+ femoral and dorsalis pedis pulses palpable bilaterally.     Assessment:     Doing well 2 weeks post aorta to SMA bypass for chronic mesenteric ischemia due to SMA occlusion Episode of vomiting on 2 occasions last week has resolved and patient feels well    Plan:     Patient to return in 2 months with duplex scan of aorta to SMA bypass and ABIs Okay to resume driving Diet as tolerated

## 2014-04-06 ENCOUNTER — Encounter: Payer: Medicare HMO | Admitting: Vascular Surgery

## 2014-04-07 ENCOUNTER — Encounter: Payer: Self-pay | Admitting: Vascular Surgery

## 2014-04-07 NOTE — Progress Notes (Unsigned)
Patient ID: Connor Bryant, male   DOB: [birth year], 68 y.o.   MRN: [id number] Regarding June 17 discharge summary Included in the final diagnosis should be #1 acute blood loss anemia

## 2014-05-24 ENCOUNTER — Encounter: Payer: Self-pay | Admitting: Vascular Surgery

## 2014-05-25 ENCOUNTER — Ambulatory Visit (HOSPITAL_COMMUNITY)
Admission: RE | Admit: 2014-05-25 | Discharge: 2014-05-25 | Disposition: A | Discharge status: Home / Self Care | Payer: Medicare HMO | Source: Ambulatory Visit | Attending: Vascular Surgery | Admitting: Vascular Surgery

## 2014-05-25 ENCOUNTER — Encounter: Payer: Self-pay | Admitting: Vascular Surgery

## 2014-05-25 ENCOUNTER — Ambulatory Visit (INDEPENDENT_AMBULATORY_CARE_PROVIDER_SITE_OTHER)
Admission: RE | Admit: 2014-05-25 | Discharge: 2014-05-25 | Disposition: A | Discharge status: Home / Self Care | Payer: Medicare HMO | Source: Ambulatory Visit | Attending: Vascular Surgery | Admitting: Vascular Surgery

## 2014-05-25 ENCOUNTER — Ambulatory Visit (INDEPENDENT_AMBULATORY_CARE_PROVIDER_SITE_OTHER): Payer: Self-pay | Admitting: Vascular Surgery

## 2014-05-25 VITALS — BP 145/60 | HR 48 | Ht 68.0 in | Wt 178.0 lb

## 2014-05-25 DIAGNOSIS — I70209 Unspecified atherosclerosis of native arteries of extremities, unspecified extremity: Secondary | ICD-10-CM | POA: Insufficient documentation

## 2014-05-25 DIAGNOSIS — E785 Hyperlipidemia, unspecified: Secondary | ICD-10-CM | POA: Insufficient documentation

## 2014-05-25 DIAGNOSIS — Z9889 Other specified postprocedural states: Secondary | ICD-10-CM | POA: Insufficient documentation

## 2014-05-25 DIAGNOSIS — Z48812 Encounter for surgical aftercare following surgery on the circulatory system: Secondary | ICD-10-CM

## 2014-05-25 DIAGNOSIS — I10 Essential (primary) hypertension: Secondary | ICD-10-CM | POA: Diagnosis not present

## 2014-05-25 DIAGNOSIS — K551 Chronic vascular disorders of intestine: Secondary | ICD-10-CM | POA: Diagnosis present

## 2014-05-25 DIAGNOSIS — Z87891 Personal history of nicotine dependence: Secondary | ICD-10-CM | POA: Insufficient documentation

## 2014-05-25 NOTE — Progress Notes (Signed)
Subjective:     Patient ID: Connor Bryant, male   DOB: 10-Dec-1945, 68 y.o.   MRN: [id number]  HPI this 68 year old male returns for continued followup regarding his chronic mesenteric ischemia treated with aortic to SMA bypass 03/10/2014. He has done extremely well with no GI complaints. He has gained 11 pounds since his surgery now weighing 178 pounds. He has no loose bowel movements, nausea, postprandial pain, or any GI complaints. His appetite is good. Has not smoked in 5 years. He has no claudication.   Review of Systems     Objective:   Physical Exam BP 145/60  Pulse 48  Ht 5\' 8"  (1.727 m)  Wt 178 lb (80.74 kg)  BMI 27.07 kg/m2  SpO2 100%  General well-developed well-nourished male no apparent stress alert and oriented x3 Lungs no rhonchi or wheezing Abdomen soft nontender. No bruits are audible. No evidence of ventral hernia. 2-3+ femoral dorsalis pedis and posterior tibial pulses palpable bilaterally.  Today I ordered a duplex scan of the aortic to SMA bypass which reveals no evidence of stenosis and a widely patent graft. Also ordered ABIs which are 90% on the right and 1.07 on the left with triphasic flow in the dorsalis pedis bilaterally      Assessment:     Doing well 2-1/2 months post aorto-SMA bypass for chronic mesenteric ischemia with weight gain of 11 pounds and  absent GI symptoms    Plan:     Return 6 months for repeat duplex scan of graft and check ABIs

## 2014-05-25 NOTE — Addendum Note (Signed)
Addended by: Mena Goes on: 05/25/2014 02:44 PM   Modules accepted: Orders

## 2014-09-02 ENCOUNTER — Encounter (HOSPITAL_COMMUNITY): Payer: Self-pay | Admitting: Vascular Surgery

## 2014-11-22 ENCOUNTER — Encounter: Payer: Self-pay | Admitting: Vascular Surgery

## 2014-11-23 ENCOUNTER — Ambulatory Visit (INDEPENDENT_AMBULATORY_CARE_PROVIDER_SITE_OTHER): Payer: Medicare HMO | Admitting: Vascular Surgery

## 2014-11-23 ENCOUNTER — Ambulatory Visit (INDEPENDENT_AMBULATORY_CARE_PROVIDER_SITE_OTHER)
Admission: RE | Admit: 2014-11-23 | Discharge: 2014-11-23 | Disposition: A | Discharge status: Home / Self Care | Payer: Medicare HMO | Source: Ambulatory Visit | Attending: Vascular Surgery | Admitting: Vascular Surgery

## 2014-11-23 ENCOUNTER — Ambulatory Visit (HOSPITAL_COMMUNITY)
Admission: RE | Admit: 2014-11-23 | Discharge: 2014-11-23 | Disposition: A | Discharge status: Home / Self Care | Payer: Medicare HMO | Source: Ambulatory Visit | Attending: Vascular Surgery | Admitting: Vascular Surgery

## 2014-11-23 ENCOUNTER — Encounter: Payer: Self-pay | Admitting: Vascular Surgery

## 2014-11-23 VITALS — BP 150/62 | HR 57 | Ht 68.0 in | Wt 202.0 lb

## 2014-11-23 DIAGNOSIS — K551 Chronic vascular disorders of intestine: Secondary | ICD-10-CM

## 2014-11-23 DIAGNOSIS — I350 Nonrheumatic aortic (valve) stenosis: Secondary | ICD-10-CM

## 2014-11-23 DIAGNOSIS — Z48812 Encounter for surgical aftercare following surgery on the circulatory system: Secondary | ICD-10-CM

## 2014-11-23 NOTE — Progress Notes (Signed)
Subjective:     Patient ID: Connor Bryant, male   DOB: 08/26/1946, 69 y.o.   MRN: [id number]  HPI this 69 year old male is seen in follow-up regarding his aorta to SMA bypass done by me 03/10/2014 for severe chronic mesenteric ischemia. Patient had lost down to 170 pounds from 204 pounds and was having severe diarrhea and postprandial pain. He has now gained his weight back up to 202 pounds. His bowel movements are normal with no diarrhea or blood and he has no postprandial pain. He does not use tobacco. He has no symptoms of claudication in his lower extremities.  Past Medical History  Diagnosis Date  . Thoracic spine fracture   . HTN (hypertension)   . DM type 2 (diabetes mellitus, type 2)     no meds  . Hyperlipidemia   . Renal insufficiency     mild  . Personal history of colonic polyp-adenoma 11/24/2013  . Gallstone   . Heart murmur     was told one time he had a murmur, not since 30 years ago  . Arthritis     History  Substance Use Topics  . Smoking status: Former Smoker    Types: Cigarettes    Quit date: 09/24/2008  . Smokeless tobacco: Former Systems developer    Types: Chew     Comment: Pt just tried  for short time  . Alcohol Use: 4.8 oz/week    8 Cans of beer per week     Comment: 8 beers a week    Family History  Problem Relation Age of Onset  . Hypertension Father   . Coronary artery disease Father   . Hyperlipidemia Father   . Diabetes Father   . Bladder Cancer Father   . Cancer Father   . Heart disease Father   . Heart attack Father   . Colon cancer Neg Hx   . Throat cancer Neg Hx   . Kidney disease Neg Hx   . Liver disease Neg Hx   . Diabetes Mother   . Heart disease Mother     before age 72  . Hyperlipidemia Mother   . Hypertension Mother   . AAA (abdominal aortic aneurysm) Mother     No Known Allergies   Current outpatient prescriptions:  .  allopurinol (ZYLOPRIM) 100 MG tablet, Take 100 mg by mouth daily. , Disp: , Rfl:  .  aspirin 81 MG tablet, Take  81 mg by mouth daily., Disp: , Rfl:  .  atorvastatin (LIPITOR) 20 MG tablet, Take 20 mg by mouth daily., Disp: , Rfl:  .  hydrochlorothiazide (HYDRODIURIL) 25 MG tablet, Take 25 mg by mouth daily., Disp: , Rfl:  .  metoprolol succinate (TOPROL-XL) 100 MG 24 hr tablet, Take 100 mg by mouth daily. Take with or immediately following a meal., Disp: , Rfl:  .  trandolapril (MAVIK) 4 MG tablet, Take 4 mg by mouth daily., Disp: , Rfl:  .  verapamil (COVERA HS) 240 MG (CO) 24 hr tablet, Take 480 mg by mouth daily. , Disp: , Rfl:  .  colestipol (COLESTID) 5 G granules, Take 5 g by mouth daily. With supper (Patient not taking: Reported on 11/23/2014), Disp: 500 g, Rfl: 1 .  docusate sodium (COLACE) 50 MG capsule, Take 50 mg by mouth 2 (two) times daily., Disp: , Rfl:  .  HYDROcodone-acetaminophen (NORCO) 5-325 MG per tablet, Take 1 tablet by mouth every 6 (six) hours as needed for moderate pain. (Patient not taking: Reported on  11/23/2014), Disp: 30 tablet, Rfl: 0 .  loperamide (IMODIUM) 2 MG capsule, Take by mouth as needed for diarrhea or loose stools., Disp: , Rfl:   BP 150/62 mmHg  Pulse 57  Ht 5\' 8"  (1.727 m)  Wt 202 lb (91.627 kg)  BMI 30.72 kg/m2  SpO2 100%  Body mass index is 30.72 kg/(m^2).         Review of Systems denies chest pain, dyspnea on exertion, PND, orthopnea, lateralizing weakness, aphasia, amaurosis fugax, diplopia, blurred vision, syncope. All other systems negative and a complete review of systems     Objective:   Physical Exam BP 150/62 mmHg  Pulse 57  Ht 5\' 8"  (1.727 m)  Wt 202 lb (91.627 kg)  BMI 30.72 kg/m2  SpO2 100%  Gen.-alert and oriented x3 in no apparent distress HEENT normal for age Lungs no rhonchi or wheezing Cardiovascular regular rhythm no murmurs carotid pulses 3+ palpable no bruits audible Abdomen soft nontender no palpable masses-no evidence of ventral hernia-no bruits heard  Musculoskeletal free of  major deformities Skin clear -no  rashes Neurologic normal Lower extremities 3+ femoral and posterior tibial pulses palpable bilaterally with no edema  Today I ordered lower extremity ABIs and a duplex scan of his SMA and aorta which I reviewed and interpreted. ABIs are totally normal with biphasic and triphasic flow with ABIs exceeding 1.0. Duplex scan was difficult to interpret because of a lot of bowel gas. There does appear to be some elevated velocity at the proximal anastomosis of the aorta to SMA bypass but the severity of this is unclear.      Assessment:     #1 doing well post aorta to SMA bypass June 2015 for chronic mesenteric ischemia with good weight gain and relief of gastrointestinal symptoms #2 diabetes mellitus type 2-stable and well controlled #3 hypertension-well controlled #4 history of tobacco abuse-discontinued smoking in 2010 #5 possible mild narrowing of SMA to aortic anastomosis-unclear because of bowel gas    Plan:     Patient will return in 6 months with CT angiogram of abdomen to more clearly delineate aorta to SMA bypass If he should develop any recurrent symptoms of chronic mesenteric ischemia which he is well aware of he will be in touch with [place] to obtain study at an earlier date Doubt significant stenosis at this time with patient's great clinical response

## 2014-11-23 NOTE — Addendum Note (Signed)
Addended by: Mena Goes on: 11/23/2014 11:35 AM   Modules accepted: Orders

## 2015-05-13 ENCOUNTER — Other Ambulatory Visit: Payer: Self-pay | Admitting: *Deleted

## 2015-05-13 DIAGNOSIS — Z01812 Encounter for preprocedural laboratory examination: Secondary | ICD-10-CM

## 2015-05-16 ENCOUNTER — Ambulatory Visit
Admission: RE | Admit: 2015-05-16 | Discharge: 2015-05-16 | Disposition: A | Discharge status: Home / Self Care | Payer: Medicare HMO | Source: Ambulatory Visit | Attending: Vascular Surgery | Admitting: Vascular Surgery

## 2015-05-16 DIAGNOSIS — Z48812 Encounter for surgical aftercare following surgery on the circulatory system: Secondary | ICD-10-CM

## 2015-05-16 DIAGNOSIS — I350 Nonrheumatic aortic (valve) stenosis: Secondary | ICD-10-CM

## 2015-05-16 DIAGNOSIS — K551 Chronic vascular disorders of intestine: Secondary | ICD-10-CM

## 2015-05-16 MED ORDER — IOPAMIDOL (ISOVUE-370) INJECTION 76%
75.0000 mL | Freq: Once | INTRAVENOUS | Status: AC | PRN
  Administered 2015-05-16: 75 mL via INTRAVENOUS

## 2015-05-23 ENCOUNTER — Encounter: Payer: Self-pay | Admitting: Vascular Surgery

## 2015-05-24 ENCOUNTER — Encounter: Payer: Self-pay | Admitting: Vascular Surgery

## 2015-05-24 ENCOUNTER — Ambulatory Visit (INDEPENDENT_AMBULATORY_CARE_PROVIDER_SITE_OTHER): Payer: Medicare HMO | Admitting: Vascular Surgery

## 2015-05-24 VITALS — BP 205/75 | HR 56 | Ht 68.0 in | Wt 206.5 lb

## 2015-05-24 DIAGNOSIS — K551 Chronic vascular disorders of intestine: Secondary | ICD-10-CM

## 2015-05-24 NOTE — Addendum Note (Signed)
Addended by: Dorthula Rue L on: 05/24/2015 12:37 PM   Modules accepted: Orders

## 2015-05-24 NOTE — Progress Notes (Signed)
Subjective:     Patient ID: Connor Bryant, male   DOB: 12/22/1945, 69 y.o.   MRN: [id number]  HPI this 69 year old male returns for continued follow-up regarding his aorta to SMA bypass done in June 2015 for total occlusion of SMA. Patient had lost from 204-170 and had chronic diarrhea and postprandial pain. All of his symptoms resolved and he currently weighs 206. Denies any loose bowel movements, postprandial abdominal pain, or other GI symptoms. He has not smoked since 2010. He has some buttock claudication but is able to ambulate long distances.  Past Medical History  Diagnosis Date  . Thoracic spine fracture   . HTN (hypertension)   . DM type 2 (diabetes mellitus, type 2)     no meds  . Hyperlipidemia   . Renal insufficiency     mild  . Personal history of colonic polyp-adenoma 11/24/2013  . Gallstone   . Heart murmur     was told one time he had a murmur, not since 30 years ago  . Arthritis     Social History  Substance Use Topics  . Smoking status: Former Smoker    Types: Cigarettes    Quit date: 09/24/2008  . Smokeless tobacco: Former Systems developer    Types: Chew     Comment: Pt just tried  for short time  . Alcohol Use: 7.2 oz/week    12 Cans of beer per week     Comment: 8 beers a week    Family History  Problem Relation Age of Onset  . Hypertension Father   . Coronary artery disease Father   . Hyperlipidemia Father   . Diabetes Father   . Bladder Cancer Father   . Cancer Father   . Heart disease Father   . Heart attack Father   . Colon cancer Neg Hx   . Throat cancer Neg Hx   . Kidney disease Neg Hx   . Liver disease Neg Hx   . Diabetes Mother   . Heart disease Mother     before age 39  . Hyperlipidemia Mother   . Hypertension Mother   . AAA (abdominal aortic aneurysm) Mother     No Known Allergies   Current outpatient prescriptions:  .  allopurinol (ZYLOPRIM) 100 MG tablet, Take 100 mg by mouth daily. , Disp: , Rfl:  .  aspirin 81 MG tablet, Take 81 mg by  mouth daily., Disp: , Rfl:  .  atorvastatin (LIPITOR) 20 MG tablet, Take 20 mg by mouth daily., Disp: , Rfl:  .  hydrochlorothiazide (HYDRODIURIL) 25 MG tablet, Take 25 mg by mouth daily., Disp: , Rfl:  .  metFORMIN (GLUCOPHAGE-XR) 500 MG 24 hr tablet, Take 1,000 mg by mouth daily with breakfast. , Disp: , Rfl:  .  metoprolol succinate (TOPROL-XL) 100 MG 24 hr tablet, Take 100 mg by mouth daily. Take with or immediately following a meal., Disp: , Rfl:  .  trandolapril (MAVIK) 4 MG tablet, Take 4 mg by mouth daily., Disp: , Rfl:  .  verapamil (COVERA HS) 240 MG (CO) 24 hr tablet, Take 480 mg by mouth daily. , Disp: , Rfl:  .  colestipol (COLESTID) 5 G granules, Take 5 g by mouth daily. With supper (Patient not taking: Reported on 11/23/2014), Disp: 500 g, Rfl: 1 .  docusate sodium (COLACE) 50 MG capsule, Take 50 mg by mouth 2 (two) times daily., Disp: , Rfl:  .  HYDROcodone-acetaminophen (NORCO) 5-325 MG per tablet, Take 1 tablet by  mouth every 6 (six) hours as needed for moderate pain. (Patient not taking: Reported on 11/23/2014), Disp: 30 tablet, Rfl: 0 .  loperamide (IMODIUM) 2 MG capsule, Take by mouth as needed for diarrhea or loose stools., Disp: , Rfl:   Filed Vitals:   05/24/15 1028 05/24/15 1030  BP: 184/80 205/75  Pulse: 56   Height: 5\' 8"  (1.727 m)   Weight: 206 lb 8 oz (93.668 kg)   SpO2: 96%     Body mass index is 31.41 kg/(m^2).           Review of Systems denies chest pain, dyspnea on exertion, PND, orthopnea,   does have type 2 diabetes mellitus and history of colonic polyp. Other systems negative and a complete review of systems Objective:   Physical Exam BP 205/75 mmHg  Pulse 56  Ht 5\' 8"  (1.727 m)  Wt 206 lb 8 oz (93.668 kg)  BMI 31.41 kg/m2  SpO2 96%  Gen.-alert and oriented x3 in no apparent distress HEENT normal for age Lungs no rhonchi or wheezing Cardiovascular regular rhythm no murmurs carotid pulses 3+ palpable no bruits audible Abdomen soft nontender  no palpable masses-soft bruit to the left of midline and upper abdomen Musculoskeletal free of  major deformities Skin clear -no rashes Neurologic normal Lower extremities 3+ femoral and dorsalis pedis pulses palpable bilaterally with no edema  Today I reviewed the CT angiogram of the abdomen which was done last week by computer. This visualizes the aorta to SMA bypass which is widely patent. There had been some discussion after the duplex scan performed at his last visit that there could be some elevated velocities at the distal anastomosis and this area is fairly well visualized and does not appear to be significantly stenotic       Assessment:     14 months post aorta SMA bypass for severe mesenteric ischemia with weight loss diarrhea and postprandial pain all of which were completely eliminated following surgery Patient continues to be asymptomatic from a GI standpoint Remote history of tobacco abuse Type 2 diabetes mellitus Hypertension-well controlled    Plan:     Return in 1 year with repeat CT angiogram of abdomen to visualize aorta to SMA bypass If patient develops any GI symptoms in the interim he will be in touch with [place] for further evaluation Currently patient is doing quite well with no symptoms and a patent bypass graft

## 2016-05-29 ENCOUNTER — Inpatient Hospital Stay: Admission: RE | Admit: 2016-05-29 | Payer: Medicare HMO | Source: Ambulatory Visit

## 2016-05-31 ENCOUNTER — Encounter: Payer: Self-pay | Admitting: Vascular Surgery

## 2016-06-04 ENCOUNTER — Other Ambulatory Visit: Payer: Self-pay | Admitting: *Deleted

## 2016-06-04 MED ORDER — PIOGLITAZONE HCL 30 MG PO TABS: 30.0000 mg | ORAL_TABLET | Freq: Every day | ORAL | 2 refills | Status: DC

## 2016-06-05 ENCOUNTER — Ambulatory Visit (INDEPENDENT_AMBULATORY_CARE_PROVIDER_SITE_OTHER): Payer: Medicare HMO | Admitting: Vascular Surgery

## 2016-06-05 ENCOUNTER — Encounter: Payer: Self-pay | Admitting: Vascular Surgery

## 2016-06-05 VITALS — BP 195/84 | HR 50 | Temp 97.0°F | Resp 16 | Ht 68.0 in | Wt 204.0 lb

## 2016-06-05 DIAGNOSIS — K551 Chronic vascular disorders of intestine: Secondary | ICD-10-CM

## 2016-06-05 NOTE — Addendum Note (Signed)
Addended by: Kaleen Mask on: 06/05/2016 11:30 AM   Modules accepted: Orders

## 2016-06-05 NOTE — Progress Notes (Signed)
Patient name: Connor Bryant MRN: [id number] DOB: 1946/07/25 Sex: male  REASON FOR VISIT: Follow-up of aorta mesenteric bypass Dr. Kellie Simmering in June 2015. He had a major weight loss and the diagnosis of mesenteric ischemia. Did well with his bypass. Was seen one year ago with a CTA a follow-up showing nice result from his aorta to spare mesenteric bypass. There had been a duplex prior to this which suggested possible narrowing of an anastomosis.  HPI: Connor Bryant is a 70 y.o. male doing well overall. Reports occasional loose stools in the morning has no postprandial pain and no  abdominal pain. He remains quite active with no new major medical problems . Does report some right hip pain but this is not related to walking and does not appear to be related to claudication. He was arthritic.  Past Medical History:  Diagnosis Date  . Arthritis   . DM type 2 (diabetes mellitus, type 2) (HCC)    no meds  . Gallstone   . Heart murmur    was told one time he had a murmur, not since 30 years ago  . HTN (hypertension)   . Hyperlipidemia   . Personal history of colonic polyp-adenoma 11/24/2013  . Renal insufficiency    mild  . Thoracic spine fracture [facility])     Family History  Problem Relation Age of Onset  . Hypertension Father   . Coronary artery disease Father   . Hyperlipidemia Father   . Diabetes Father   . Bladder Cancer Father   . Cancer Father   . Heart disease Father   . Heart attack Father   . Colon cancer Neg Hx   . Throat cancer Neg Hx   . Kidney disease Neg Hx   . Liver disease Neg Hx   . Diabetes Mother   . Heart disease Mother     before age 80  . Hyperlipidemia Mother   . Hypertension Mother   . AAA (abdominal aortic aneurysm) Mother     SOCIAL HISTORY: Social History  Substance Use Topics  . Smoking status: Former Smoker    Types: Cigarettes    Quit date: 07/08/2009  . Smokeless tobacco: Former Systems developer    Types: Chew     Comment: Pt just tried  for short time  .  Alcohol use 7.2 oz/week    12 Cans of beer per week     Comment: 8 beers a week    No Known Allergies  Current Outpatient Prescriptions  Medication Sig Dispense Refill  . allopurinol (ZYLOPRIM) 100 MG tablet Take 100 mg by mouth daily.     Marland Kitchen aspirin 81 MG tablet Take 81 mg by mouth daily.    Marland Kitchen atorvastatin (LIPITOR) 20 MG tablet Take 20 mg by mouth daily.    . hydrochlorothiazide (HYDRODIURIL) 25 MG tablet Take 25 mg by mouth daily.    . metFORMIN (GLUCOPHAGE-XR) 500 MG 24 hr tablet Take 1,000 mg by mouth daily with breakfast.     . metoprolol succinate (TOPROL-XL) 100 MG 24 hr tablet Take 200 mg by mouth daily. Take with or immediately following a meal.     . pioglitazone (ACTOS) 30 MG tablet Take 1 tablet (30 mg total) by mouth daily. 30 tablet 2  . trandolapril (MAVIK) 4 MG tablet Take 4 mg by mouth daily.    . verapamil (COVERA HS) 240 MG (CO) 24 hr tablet Take 480 mg by mouth daily.      No current facility-administered  medications for this visit.     REVIEW OF SYSTEMS:  [X]  denotes positive finding, [ ]  denotes negative finding Cardiac  Comments:  Chest pain or chest pressure:    Shortness of breath upon exertion:    Short of breath when lying flat:    Irregular heart rhythm:        Vascular    Pain in calf, thigh, or hip brought on by ambulation:    Pain in feet at night that wakes you up from your sleep:     Blood clot in your veins:    Leg swelling:         Pulmonary    Oxygen at home:    Productive cough:     Wheezing:         Neurologic    Sudden weakness in arms or legs:     Sudden numbness in arms or legs:     Sudden onset of difficulty speaking or slurred speech:    Temporary loss of vision in one eye:     Problems with dizziness:         Gastrointestinal    Blood in stool:     Vomited blood:         Genitourinary    Burning when urinating:     Blood in urine:        Psychiatric    Major depression:         Hematologic    Bleeding problems:      Problems with blood clotting too easily:        Skin    Rashes or ulcers:        Constitutional    Fever or chills:      PHYSICAL EXAM: Vitals:   06/05/16 1039 06/05/16 1041  BP: (!) 162/86 (!) 195/84  Pulse: (!) 50   Resp: 16   Temp: 97 F (36.1 C)   TempSrc: Oral   SpO2: 97%   Weight: 204 lb (92.5 kg)   Height: 5\' 8"  (1.727 m)     GENERAL: The patient is a well-nourished male, in no acute distress. The vital signs are documented above. VASCULAR: 2+ femoral pulses bilaterally PULMONARY: There is good air exchange bilaterally without wheezing or rales. ABDOMEN: Soft and non-tender with normal pitched bowel sounds. No bruit noted. Midline incision well healed. No masses noted. MUSCULOSKELETAL: There are no major deformities or cyanosis. NEUROLOGIC: No focal weakness or paresthesias are detected. SKIN: There are no ulcers or rashes noted. PSYCHIATRIC: The patient has a normal affect.    MEDICAL ISSUES:  Stable at his post aorta to SMA bypass 2015. Unfortunately his insurance company requires a face-to-face evaluation prior to approving the CAT scan for follow-up of this. We discussed this today. We will obtain a CT scan now and contact him regarding the results. Assuming this still looks good I would recommend that we see him in 2 years with ultrasound of his bypass. He will notify should he develop any new difficulty regarding abdominal pain    Jairen Goldfarb [facility] [employer] [facility] [phone]

## 2016-06-06 ENCOUNTER — Ambulatory Visit (INDEPENDENT_AMBULATORY_CARE_PROVIDER_SITE_OTHER): Payer: Medicare HMO | Admitting: Physician Assistant

## 2016-06-06 ENCOUNTER — Encounter: Payer: Self-pay | Admitting: Physician Assistant

## 2016-06-06 VITALS — BP 180/75 | HR 48 | Temp 97.8°F | Ht 68.0 in | Wt 203.2 lb

## 2016-06-06 DIAGNOSIS — M109 Gout, unspecified: Secondary | ICD-10-CM | POA: Insufficient documentation

## 2016-06-06 DIAGNOSIS — I10 Essential (primary) hypertension: Secondary | ICD-10-CM | POA: Diagnosis not present

## 2016-06-06 DIAGNOSIS — E119 Type 2 diabetes mellitus without complications: Secondary | ICD-10-CM | POA: Insufficient documentation

## 2016-06-06 DIAGNOSIS — M1A9XX1 Chronic gout, unspecified, with tophus (tophi): Secondary | ICD-10-CM | POA: Insufficient documentation

## 2016-06-06 DIAGNOSIS — Z6830 Body mass index (BMI) 30.0-30.9, adult: Secondary | ICD-10-CM | POA: Diagnosis not present

## 2016-06-06 LAB — CMP14+EGFR
ALT: 14 IU/L (ref 0–44)
AST: 13 IU/L (ref 0–40)
Albumin/Globulin Ratio: 1.7 (ref 1.2–2.2)
Albumin: 4.5 g/dL (ref 3.5–4.8)
Alkaline Phosphatase: 79 IU/L (ref 39–117)
BUN/Creatinine Ratio: 21 (ref 10–24)
BUN: 21 mg/dL (ref 8–27)
Bilirubin Total: 0.6 mg/dL (ref 0.0–1.2)
CO2: 26 mmol/L (ref 18–29)
CREATININE: 0.98 mg/dL (ref 0.76–1.27)
Calcium: 9.6 mg/dL (ref 8.6–10.2)
Chloride: 99 mmol/L (ref 96–106)
GFR calc Af Amer: 89 mL/min/{1.73_m2} (ref >59)
GFR calc non Af Amer: 77 mL/min/{1.73_m2} (ref >59)
GLOBULIN, TOTAL: 2.7 g/dL (ref 1.5–4.5)
GLUCOSE: 144 mg/dL — AB (ref 65–99)
Potassium: 4.2 mmol/L (ref 3.5–5.2)
SODIUM: 143 mmol/L (ref 134–144)
Total Protein: 7.2 g/dL (ref 6.0–8.5)

## 2016-06-06 LAB — BAYER DCA HB A1C WAIVED: HB A1C (BAYER DCA - WAIVED): 7.7 % — ABNORMAL HIGH (ref <7.0)

## 2016-06-06 MED ORDER — HYDRALAZINE HCL 25 MG PO TABS: 25.0000 mg | ORAL_TABLET | Freq: Three times a day (TID) | ORAL | 1 refills | Status: DC

## 2016-06-06 NOTE — Patient Instructions (Signed)
Hypertension Hypertension, commonly called high blood pressure, is when the force of blood pumping through your arteries is too strong. Your arteries are the blood vessels that carry blood from your heart throughout your body. A blood pressure reading consists of a higher number over a lower number, such as 110/72. The higher number (systolic) is the pressure inside your arteries when your heart pumps. The lower number (diastolic) is the pressure inside your arteries when your heart relaxes. Ideally you want your blood pressure below 120/80. Hypertension forces your heart to work harder to pump blood. Your arteries may become narrow or stiff. Having untreated or uncontrolled hypertension can cause heart attack, stroke, kidney disease, and other problems. RISK FACTORS Some risk factors for high blood pressure are controllable. Others are not.  Risk factors you cannot control include:   Race. You may be at higher risk if you are African American.  Age. Risk increases with age.  Gender. Men are at higher risk than women before age 45 years. After age 65, women are at higher risk than men. Risk factors you can control include:  Not getting enough exercise or physical activity.  Being overweight.  Getting too much fat, sugar, calories, or salt in your diet.  Drinking too much alcohol. SIGNS AND SYMPTOMS Hypertension does not usually cause signs or symptoms. Extremely high blood pressure (hypertensive crisis) may cause headache, anxiety, shortness of breath, and nosebleed. DIAGNOSIS To check if you have hypertension, your health care provider will measure your blood pressure while you are seated, with your arm held at the level of your heart. It should be measured at least twice using the same arm. Certain conditions can cause a difference in blood pressure between your right and left arms. A blood pressure reading that is higher than normal on one occasion does not mean that you need treatment. If  it is not clear whether you have high blood pressure, you may be asked to return on a different day to have your blood pressure checked again. Or, you may be asked to monitor your blood pressure at home for 1 or more weeks. TREATMENT Treating high blood pressure includes making lifestyle changes and possibly taking medicine. Living a healthy lifestyle can help lower high blood pressure. You may need to change some of your habits. Lifestyle changes may include:  Following the DASH diet. This diet is high in fruits, vegetables, and whole grains. It is low in salt, red meat, and added sugars.  Keep your sodium intake below 2,300 mg per day.  Getting at least 30-45 minutes of aerobic exercise at least 4 times per week.  Losing weight if necessary.  Not smoking.  Limiting alcoholic beverages.  Learning ways to reduce stress. Your health care provider may prescribe medicine if lifestyle changes are not enough to get your blood pressure under control, and if one of the following is true:  You are 18-59 years of age and your systolic blood pressure is above 140.  You are 60 years of age or older, and your systolic blood pressure is above 150.  Your diastolic blood pressure is above 90.  You have diabetes, and your systolic blood pressure is over 140 or your diastolic blood pressure is over 90.  You have kidney disease and your blood pressure is above 140/90.  You have heart disease and your blood pressure is above 140/90. Your personal target blood pressure may vary depending on your medical conditions, your age, and other factors. HOME CARE INSTRUCTIONS    Have your blood pressure rechecked as directed by your health care provider.   Take medicines only as directed by your health care provider. Follow the directions carefully. Blood pressure medicines must be taken as prescribed. The medicine does not work as well when you skip doses. Skipping doses also puts you at risk for  problems.  Do not smoke.   Monitor your blood pressure at home as directed by your health care provider. SEEK MEDICAL CARE IF:   You think you are having a reaction to medicines taken.  You have recurrent headaches or feel dizzy.  You have swelling in your ankles.  You have trouble with your vision. SEEK IMMEDIATE MEDICAL CARE IF:  You develop a severe headache or confusion.  You have unusual weakness, numbness, or feel faint.  You have severe chest or abdominal pain.  You vomit repeatedly.  You have trouble breathing. MAKE SURE YOU:   Understand these instructions.  Will watch your condition.  Will get help right away if you are not doing well or get worse.   This information is not intended to replace advice given to you by your health care provider. Make sure you discuss any questions you have with your health care provider.   Document Released: 09/10/2005 Document Revised: 01/25/2015 Document Reviewed: 07/03/2013 Elsevier Interactive Patient Education 2016 Elsevier Inc.  

## 2016-06-06 NOTE — Progress Notes (Signed)
BP (!) 180/75   Pulse (!) 48   Temp 97.8 F (36.6 C) (Oral)   Ht 5' 8"  (1.727 m)   Wt 203 lb 3.2 oz (92.2 kg)   BMI 30.90 kg/m    Subjective:    Patient ID: Connor Bryant, male    DOB: Feb 17, 1946, 70 y.o.   MRN: [id number]  Connor Bryant is a 70 y.o. male presenting on 06/06/2016 for rck A1c  HPI Patient here to be established as new patient at [facility].  This patient is known to me from [facility]. This patient comes in today for a recheck and medication concerns. He has multiple medical problems that are inclusive of hypertension, peripheral vascular disease, mesenteric ischemia, non-insulin-dependent diabetes, gout, hyperlipidemia. All of his medications are reviewed today. His A1c is down to 7.7. The last time it was around 10. He is doing much better with his medications and diet. He was seen yesterday by his vascular surgeon and found to be in very good control. His Dopplers were almost normal and the peripheral vascular area. Dr. Kellie Simmering did not feel he needed to have a CT of his abdomen this year.   Relevant past medical, surgical, family and social history reviewed and updated as indicated. Interim medical history since our last visit reviewed. Allergies and medications reviewed and updated.   Data reviewed from any sources in EPIC.  Review of Systems  Constitutional: Negative.  Negative for appetite change and fatigue.  HENT: Negative.   Eyes: Negative.  Negative for pain and visual disturbance.  Respiratory: Negative.  Negative for cough, chest tightness, shortness of breath and wheezing.   Cardiovascular: Negative.  Negative for chest pain, palpitations and leg swelling.  Gastrointestinal: Negative.  Negative for abdominal pain, diarrhea, nausea and vomiting.  Endocrine: Negative.   Genitourinary: Negative.   Musculoskeletal: Negative.   Skin: Negative.  Negative for color change and rash.  Neurological: Negative.  Negative for  weakness, numbness and headaches.  Psychiatric/Behavioral: Negative.     Per HPI unless specifically indicated above  Social History   Social History  . Marital status: Married    Spouse name: N/A  . Number of children: N/A  . Years of education: N/A   Occupational History  . Not on file.   Social History Main Topics  . Smoking status: Former Smoker    Types: Cigarettes    Quit date: 07/08/2009  . Smokeless tobacco: Former Systems developer    Types: Chew     Comment: Pt just tried  for short time  . Alcohol use 7.2 oz/week    12 Cans of beer per week     Comment: 8 beers a week  . Drug use: No  . Sexual activity: Not on file   Other Topics Concern  . Not on file   Social History Narrative   Patient is married, he is retired. 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    Past Surgical History:  Procedure Laterality Date  . ABDOMINAL AORTAGRAM N/A 02/23/2014   Procedure: ABDOMINAL [name];  Surgeon: Conrad Monmouth, MD;  Location: [facility] CATH LAB;  Service: Cardiovascular;  Laterality: N/A;  . COLONOSCOPY    . DEBRIDEMENT TENNIS ELBOW Right   . ESOPHAGOGASTRODUODENOSCOPY    . MESENTERIC ARTERY BYPASS N/A 03/10/2014   Procedure: AORTO TO SUPERIOR MESENTERIC ARTERY BYPASS;  Surgeon: [name], MD;  Location: [place];  Service: Vascular;  Laterality: N/A;  . TONSILLECTOMY  AND ADENOIDECTOMY      Family History  Problem Relation Age of Onset  . Hypertension Father   . Coronary artery disease Father   . Hyperlipidemia Father   . Diabetes Father   . Bladder Cancer Father   . Cancer Father   . Heart disease Father   . Heart attack Father   . Diabetes Mother   . Heart disease Mother     before age 42  . Hyperlipidemia Mother   . Hypertension Mother   . AAA (abdominal aortic aneurysm) Mother   . Colon cancer Neg Hx   . Throat cancer Neg Hx   . Kidney disease Neg Hx   . Liver disease Neg Hx       Medication List       Accurate as of 06/06/16  9:13 AM. Always use  your most recent med list.          allopurinol 100 MG tablet Commonly known as:  ZYLOPRIM Take 100 mg by mouth daily.   aspirin 81 MG tablet Take 81 mg by mouth daily.   atorvastatin 20 MG tablet Commonly known as:  LIPITOR Take 20 mg by mouth daily.   hydrALAZINE 25 MG tablet Commonly known as:  APRESOLINE Take 1 tablet (25 mg total) by mouth 3 (three) times daily.   hydrochlorothiazide 25 MG tablet Commonly known as:  HYDRODIURIL Take 25 mg by mouth daily.   metFORMIN 500 MG 24 hr tablet Commonly known as:  GLUCOPHAGE-XR Take 1,000 mg by mouth daily with breakfast.   metoprolol succinate 100 MG 24 hr tablet Commonly known as:  TOPROL-XL Take 200 mg by mouth daily. Take with or immediately following a meal.   pioglitazone 30 MG tablet Commonly known as:  ACTOS Take 1 tablet (30 mg total) by mouth daily.   trandolapril 4 MG tablet Commonly known as:  MAVIK Take 4 mg by mouth daily.   verapamil 240 MG CR tablet Commonly known as:  CALAN-SR Take 480 mg by mouth daily.          Objective:    BP (!) 180/75   Pulse (!) 48   Temp 97.8 F (36.6 C) (Oral)   Ht 5' 8"  (1.727 m)   Wt 203 lb 3.2 oz (92.2 kg)   BMI 30.90 kg/m   No Known Allergies Wt Readings from Last 3 Encounters:  06/06/16 203 lb 3.2 oz (92.2 kg)  06/05/16 204 lb (92.5 kg)  05/24/15 206 lb 8 oz (93.7 kg)    Physical Exam  Constitutional: He appears well-developed and well-nourished. No distress.  HENT:  Head: Normocephalic and atraumatic.  Eyes: Conjunctivae and EOM are normal. Pupils are equal, round, and reactive to light.  Cardiovascular: Regular rhythm, S1 normal and S2 normal.  Bradycardia present.   Murmur heard.  Systolic murmur is present with a grade of 2/6  Pulses:      Radial pulses are 2+ on the right side, and 2+ on the left side.       Dorsalis pedis pulses are 1+ on the right side, and 1+ on the left side.       Posterior tibial pulses are 1+ on the right side, and 1+ on  the left side.  Pulmonary/Chest: Effort normal and breath sounds normal. No respiratory distress.  Skin: Skin is warm and dry.  Psychiatric: He has a normal mood and affect. His behavior is normal.  Nursing note and vitals reviewed.       Assessment &  Plan:   1. Type 2 diabetes mellitus without complication, without long-term current use of insulin (HCC) Continue metformin and actos 30 mg - CMP14+EGFR - Bayer DCA Hb A1c Waived  2. Body mass index 30.0-30.9, adult Daily walking  3. Essential hypertension Information printed Continue hctz 25 mg, mavik 13m, verapamil 4872m metoprolol XL 20034m verapamil (CALAN-SR) 240 MG CR tablet; Take 480 mg by mouth daily.  - hydrALAZINE (APRESOLINE) 25 MG tablet; Take 1 tablet (25 mg total) by mouth 3 (three) times daily.  Dispense: 90 tablet; Refill: 1  4. Gout without tophus, unspecified cause, unspecified chronicity, unspecified site Continue allopurinol if any flare  5. Peripheral vascular Disease Continue atorvastatin 20 mg 1 daily and rechecks with his vascular surgeon.  6. Chronic mesenteric ischemia/mesenteric ischemia Follow with vascular surgeon. Was last seen on 06/05/2016 with normal Doppler scans  Continue all other maintenance medications as listed above. Educational handout given for hypertension  Follow up plan: Return in about 4 weeks (around 07/04/2016) for recheck BP.  AngTerald Sleeper-C [id number] [id number]/13/2017, 9:13 AM

## 2016-06-11 ENCOUNTER — Other Ambulatory Visit: Payer: Self-pay | Admitting: *Deleted

## 2016-06-11 DIAGNOSIS — K551 Chronic vascular disorders of intestine: Secondary | ICD-10-CM

## 2016-06-12 ENCOUNTER — Ambulatory Visit
Admission: RE | Admit: 2016-06-12 | Discharge: 2016-06-12 | Disposition: A | Discharge status: Home / Self Care | Payer: Medicare HMO | Source: Ambulatory Visit | Attending: Vascular Surgery | Admitting: Vascular Surgery

## 2016-06-12 MED ORDER — IOPAMIDOL (ISOVUE-370) INJECTION 76%
75.0000 mL | Freq: Once | INTRAVENOUS | Status: AC | PRN
  Administered 2016-06-12: 75 mL via INTRAVENOUS

## 2016-07-06 ENCOUNTER — Encounter: Payer: Self-pay | Admitting: Physician Assistant

## 2016-07-06 ENCOUNTER — Ambulatory Visit (INDEPENDENT_AMBULATORY_CARE_PROVIDER_SITE_OTHER): Payer: Medicare HMO | Admitting: Physician Assistant

## 2016-07-06 VITALS — BP 159/66 | HR 52 | Temp 97.0°F | Ht 68.0 in | Wt 203.8 lb

## 2016-07-06 DIAGNOSIS — I10 Essential (primary) hypertension: Secondary | ICD-10-CM | POA: Diagnosis not present

## 2016-07-06 DIAGNOSIS — Z23 Encounter for immunization: Secondary | ICD-10-CM | POA: Diagnosis not present

## 2016-07-06 DIAGNOSIS — E119 Type 2 diabetes mellitus without complications: Secondary | ICD-10-CM

## 2016-07-06 MED ORDER — HYDRALAZINE HCL 25 MG PO TABS: 25.0000 mg | ORAL_TABLET | Freq: Three times a day (TID) | ORAL | 11 refills | Status: DC

## 2016-07-06 NOTE — Progress Notes (Signed)
BP (!) 159/66   Pulse (!) 52   Temp 97 F (36.1 C) (Oral)   Ht 5' 8" (1.727 m)   Wt 203 lb 12.8 oz (92.4 kg)   BMI 30.99 kg/m    Subjective:    Patient ID: Connor Bryant, male    DOB: 01-18-1946, 70 y.o.   MRN: [id number]  HPI: CASMER Bryant is a 70 y.o. male presenting on 07/06/2016 for Hypertension (four week recheck)  Patient comes in after taking hydralazine 25 mg 3 times a day for the past month. He has tolerated the medication well. He states that he is not taking his blood pressure outside of the office. There is a great improvement however in his blood pressure from his previous visit. He was 616 systolically last visit. His second reading today showed his blood pressure to be 837 systolically. All his medications are reviewed today. We have also performed his foot evaluation. He does to [facility] for his eye exams and he is advised to make an appointment.   Relevant past medical, surgical, family and social history reviewed and updated as indicated. Interim medical history since our last visit reviewed. Allergies and medications reviewed and updated. DATA REVIEWED: CHART IN EPIC  Social History   Social History  . Marital status: Married    Spouse name: N/A  . Number of children: N/A  . Years of education: N/A   Occupational History  . Not on file.   Social History Main Topics  . Smoking status: Former Smoker    Types: Cigarettes    Quit date: 07/08/2009  . Smokeless tobacco: Former Systems developer    Types: Chew     Comment: Pt just tried  for short time  . Alcohol use 7.2 oz/week    12 Cans of beer per week     Comment: 8 beers a week  . Drug use: No  . Sexual activity: Not on file   Other Topics Concern  . Not on file   Social History Narrative   Patient is married, he is retired. 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    Past Surgical History:  Procedure Laterality Date  . ABDOMINAL AORTAGRAM N/A 02/23/2014   Procedure: ABDOMINAL [name];   Surgeon: Conrad [place], MD;  Location: [facility] CATH LAB;  Service: Cardiovascular;  Laterality: N/A;  . COLONOSCOPY    . DEBRIDEMENT TENNIS ELBOW Right   . ESOPHAGOGASTRODUODENOSCOPY    . MESENTERIC ARTERY BYPASS N/A 03/10/2014   Procedure: AORTO TO SUPERIOR MESENTERIC ARTERY BYPASS;  Surgeon: [name], MD;  Location: [facility] OR;  Service: Vascular;  Laterality: N/A;  . TONSILLECTOMY AND ADENOIDECTOMY      Family History  Problem Relation Age of Onset  . Hypertension Father   . Coronary artery disease Father   . Hyperlipidemia Father   . Diabetes Father   . Bladder Cancer Father   . Cancer Father   . Heart disease Father   . Heart attack Father   . Diabetes Mother   . Heart disease Mother     before age 67  . Hyperlipidemia Mother   . Hypertension Mother   . AAA (abdominal aortic aneurysm) Mother   . Colon cancer Neg Hx   . Throat cancer Neg Hx   . Kidney disease Neg Hx   . Liver disease Neg Hx     Review of Systems  Constitutional: Negative.  Negative for appetite change and fatigue.  Eyes:  Negative for pain and visual disturbance.  Respiratory: Negative.  Negative for cough, chest tightness, shortness of breath and wheezing.   Cardiovascular: Negative.  Negative for chest pain, palpitations and leg swelling.  Gastrointestinal: Negative.  Negative for abdominal pain, diarrhea, nausea and vomiting.  Genitourinary: Negative.   Skin: Negative.  Negative for color change and rash.  Neurological: Negative.  Negative for weakness, numbness and headaches.  Psychiatric/Behavioral: Negative.       Medication List       Accurate as of 07/06/16  9:24 AM. Always use your most recent med list.          allopurinol 100 MG tablet Commonly known as:  ZYLOPRIM Take 100 mg by mouth daily.   aspirin 81 MG tablet Take 81 mg by mouth daily.   atorvastatin 20 MG tablet Commonly known as:  LIPITOR Take 20 mg by mouth daily.   hydrALAZINE 25 MG tablet Commonly known as:   APRESOLINE Take 1 tablet (25 mg total) by mouth 3 (three) times daily.   hydrochlorothiazide 25 MG tablet Commonly known as:  HYDRODIURIL Take 25 mg by mouth daily.   metFORMIN 500 MG 24 hr tablet Commonly known as:  GLUCOPHAGE-XR Take 1,000 mg by mouth daily with breakfast.   metoprolol 200 MG 24 hr tablet Commonly known as:  TOPROL-XL Take 200 mg by mouth daily.   pioglitazone 30 MG tablet Commonly known as:  ACTOS Take 1 tablet (30 mg total) by mouth daily.   trandolapril 4 MG tablet Commonly known as:  MAVIK Take 4 mg by mouth daily.   verapamil 240 MG CR tablet Commonly known as:  CALAN-SR Take 480 mg by mouth daily.          Objective:    BP (!) 159/66   Pulse (!) 52   Temp 97 F (36.1 C) (Oral)   Ht 5' 8" (1.727 m)   Wt 203 lb 12.8 oz (92.4 kg)   BMI 30.99 kg/m   No Known Allergies  Wt Readings from Last 3 Encounters:  07/06/16 203 lb 12.8 oz (92.4 kg)  06/06/16 203 lb 3.2 oz (92.2 kg)  06/05/16 204 lb (92.5 kg)    Physical Exam  Constitutional: He appears well-developed and well-nourished. No distress.  HENT:  Head: Normocephalic and atraumatic.  Eyes: Conjunctivae and EOM are normal. Pupils are equal, round, and reactive to light.  Cardiovascular: Normal rate, regular rhythm, S1 normal, S2 normal and normal heart sounds.   Pulmonary/Chest: Effort normal and breath sounds normal. No respiratory distress.  Skin: Skin is warm and dry.  Psychiatric: He has a normal mood and affect. His behavior is normal.  Nursing note and vitals reviewed.  Diabetic Foot Exam - Simple   Simple Foot Form Diabetic Foot exam was performed with the following findings:  Yes 07/06/2016  9:06 AM  Visual Inspection No deformities, no ulcerations, no other skin breakdown bilaterally:  Yes Sensation Testing Intact to touch and monofilament testing bilaterally:  Yes Pulse Check Posterior Tibialis and Dorsalis pulse intact bilaterally:  Yes Comments      Results for  orders placed or performed in visit on 06/06/16  CMP14+EGFR  Result Value Ref Range   Glucose 144 (H) 65 - 99 mg/dL   BUN 21 8 - 27 mg/dL   Creatinine, Ser 0.98 0.76 - 1.27 mg/dL   GFR calc non Af Amer 77 >59 mL/min/1.73   GFR calc Af Amer 89 >59 mL/min/1.73   BUN/Creatinine Ratio 21 10 - 24  Sodium 143 134 - 144 mmol/L   Potassium 4.2 3.5 - 5.2 mmol/L   Chloride 99 96 - 106 mmol/L   CO2 26 18 - 29 mmol/L   Calcium 9.6 8.6 - 10.2 mg/dL   Total Protein 7.2 6.0 - 8.5 g/dL   Albumin 4.5 3.5 - 4.8 g/dL   Globulin, Total 2.7 1.5 - 4.5 g/dL   Albumin/Globulin Ratio 1.7 1.2 - 2.2   Bilirubin Total 0.6 0.0 - 1.2 mg/dL   Alkaline Phosphatase 79 39 - 117 IU/L   AST 13 0 - 40 IU/L   ALT 14 0 - 44 IU/L  Bayer DCA Hb A1c Waived  Result Value Ref Range   Bayer DCA Hb A1c Waived 7.7 (H) <7.0 %      Assessment & Plan:   1. Essential hypertension - hydrALAZINE (APRESOLINE) 25 MG tablet; Take 1 tablet (25 mg total) by mouth 3 (three) times daily.  Dispense: 90 tablet; Refill: 11 Metoprolol XL 200 mg 1 daily Dyazide 25 mg 1 daily + trandolapril 4 mg 1 daily Verapamil 240 mg 2 tablets daily Aspirin 81 mg 1 daily  2. Type 2 diabetes mellitus without complication, without long-term current use of insulin (HCC) Metformin 500 mg 2 daily Pioglitazone 30 mg 1 daily  Continue all other maintenance medications as listed above.  Follow up plan: Return in about 6 months (around 01/04/2017).   Educational handout given for hypertension   Terald Sleeper PA-C [facility] [address]  [place], [place] [address] [phone]   07/06/2016, 9:24 AM

## 2016-07-06 NOTE — Patient Instructions (Signed)
Hypertension Hypertension, commonly called high blood pressure, is when the force of blood pumping through your arteries is too strong. Your arteries are the blood vessels that carry blood from your heart throughout your body. A blood pressure reading consists of a higher number over a lower number, such as 110/72. The higher number (systolic) is the pressure inside your arteries when your heart pumps. The lower number (diastolic) is the pressure inside your arteries when your heart relaxes. Ideally you want your blood pressure below 120/80. Hypertension forces your heart to work harder to pump blood. Your arteries may become narrow or stiff. Having untreated or uncontrolled hypertension can cause heart attack, stroke, kidney disease, and other problems. RISK FACTORS Some risk factors for high blood pressure are controllable. Others are not.  Risk factors you cannot control include:   Race. You may be at higher risk if you are African American.  Age. Risk increases with age.  Gender. Men are at higher risk than women before age 45 years. After age 65, women are at higher risk than men. Risk factors you can control include:  Not getting enough exercise or physical activity.  Being overweight.  Getting too much fat, sugar, calories, or salt in your diet.  Drinking too much alcohol. SIGNS AND SYMPTOMS Hypertension does not usually cause signs or symptoms. Extremely high blood pressure (hypertensive crisis) may cause headache, anxiety, shortness of breath, and nosebleed. DIAGNOSIS To check if you have hypertension, your health care provider will measure your blood pressure while you are seated, with your arm held at the level of your heart. It should be measured at least twice using the same arm. Certain conditions can cause a difference in blood pressure between your right and left arms. A blood pressure reading that is higher than normal on one occasion does not mean that you need treatment. If  it is not clear whether you have high blood pressure, you may be asked to return on a different day to have your blood pressure checked again. Or, you may be asked to monitor your blood pressure at home for 1 or more weeks. TREATMENT Treating high blood pressure includes making lifestyle changes and possibly taking medicine. Living a healthy lifestyle can help lower high blood pressure. You may need to change some of your habits. Lifestyle changes may include:  Following the DASH diet. This diet is high in fruits, vegetables, and whole grains. It is low in salt, red meat, and added sugars.  Keep your sodium intake below 2,300 mg per day.  Getting at least 30-45 minutes of aerobic exercise at least 4 times per week.  Losing weight if necessary.  Not smoking.  Limiting alcoholic beverages.  Learning ways to reduce stress. Your health care provider may prescribe medicine if lifestyle changes are not enough to get your blood pressure under control, and if one of the following is true:  You are 18-59 years of age and your systolic blood pressure is above 140.  You are 60 years of age or older, and your systolic blood pressure is above 150.  Your diastolic blood pressure is above 90.  You have diabetes, and your systolic blood pressure is over 140 or your diastolic blood pressure is over 90.  You have kidney disease and your blood pressure is above 140/90.  You have heart disease and your blood pressure is above 140/90. Your personal target blood pressure may vary depending on your medical conditions, your age, and other factors. HOME CARE INSTRUCTIONS    Have your blood pressure rechecked as directed by your health care provider.   Take medicines only as directed by your health care provider. Follow the directions carefully. Blood pressure medicines must be taken as prescribed. The medicine does not work as well when you skip doses. Skipping doses also puts you at risk for  problems.  Do not smoke.   Monitor your blood pressure at home as directed by your health care provider. SEEK MEDICAL CARE IF:   You think you are having a reaction to medicines taken.  You have recurrent headaches or feel dizzy.  You have swelling in your ankles.  You have trouble with your vision. SEEK IMMEDIATE MEDICAL CARE IF:  You develop a severe headache or confusion.  You have unusual weakness, numbness, or feel faint.  You have severe chest or abdominal pain.  You vomit repeatedly.  You have trouble breathing. MAKE SURE YOU:   Understand these instructions.  Will watch your condition.  Will get help right away if you are not doing well or get worse.   This information is not intended to replace advice given to you by your health care provider. Make sure you discuss any questions you have with your health care provider.   Document Released: 09/10/2005 Document Revised: 01/25/2015 Document Reviewed: 07/03/2013 Elsevier Interactive Patient Education 2016 Elsevier Inc.  

## 2016-07-24 ENCOUNTER — Other Ambulatory Visit: Payer: Self-pay | Admitting: *Deleted

## 2016-07-24 MED ORDER — METOPROLOL SUCCINATE ER 200 MG PO TB24: 200.0000 mg | ORAL_TABLET | Freq: Every day | ORAL | 0 refills | Status: DC

## 2016-08-13 ENCOUNTER — Other Ambulatory Visit: Payer: Self-pay | Admitting: Physician Assistant

## 2016-10-22 ENCOUNTER — Other Ambulatory Visit: Payer: Self-pay | Admitting: Physician Assistant

## 2016-10-31 ENCOUNTER — Encounter: Payer: Self-pay | Admitting: Physician Assistant

## 2016-10-31 ENCOUNTER — Ambulatory Visit (INDEPENDENT_AMBULATORY_CARE_PROVIDER_SITE_OTHER): Payer: Medicare Other | Admitting: Physician Assistant

## 2016-10-31 VITALS — BP 157/63 | HR 57 | Temp 98.7°F | Ht 68.0 in | Wt 209.0 lb

## 2016-10-31 DIAGNOSIS — E119 Type 2 diabetes mellitus without complications: Secondary | ICD-10-CM

## 2016-10-31 DIAGNOSIS — I739 Peripheral vascular disease, unspecified: Secondary | ICD-10-CM

## 2016-10-31 DIAGNOSIS — I10 Essential (primary) hypertension: Secondary | ICD-10-CM

## 2016-10-31 MED ORDER — HYDRALAZINE HCL 50 MG PO TABS: 25.0000 mg | ORAL_TABLET | Freq: Three times a day (TID) | ORAL | 6 refills | Status: DC

## 2016-10-31 NOTE — Patient Instructions (Signed)
DASH Eating Plan DASH stands for "Dietary Approaches to Stop Hypertension." The DASH eating plan is a healthy eating plan that has been shown to reduce high blood pressure (hypertension). Additional health benefits may include reducing the risk of type 2 diabetes mellitus, heart disease, and stroke. The DASH eating plan may also help with weight loss. What do I need to know about the DASH eating plan? For the DASH eating plan, you will follow these general guidelines:  Choose foods with less than 150 milligrams of sodium per serving (as listed on the food label).  Use salt-free seasonings or herbs instead of table salt or sea salt.  Check with your health care provider or pharmacist before using salt substitutes.  Eat lower-sodium products. These are often labeled as "low-sodium" or "no salt added."  Eat fresh foods. Avoid eating a lot of canned foods.  Eat more vegetables, fruits, and low-fat dairy products.  Choose whole grains. Look for the word "whole" as the first word in the ingredient list.  Choose fish and skinless chicken or turkey more often than red meat. Limit fish, poultry, and meat to 6 oz (170 g) each day.  Limit sweets, desserts, sugars, and sugary drinks.  Choose heart-healthy fats.  Eat more home-cooked food and less restaurant, buffet, and fast food.  Limit fried foods.  Do not fry foods. Cook foods using methods such as baking, boiling, grilling, and broiling instead.  When eating at a restaurant, ask that your food be prepared with less salt, or no salt if possible. What foods can I eat? Seek help from a dietitian for individual calorie needs. Grains  Whole grain or whole wheat bread. Brown rice. Whole grain or whole wheat pasta. Quinoa, bulgur, and whole grain cereals. Low-sodium cereals. Corn or whole wheat flour tortillas. Whole grain cornbread. Whole grain crackers. Low-sodium crackers. Vegetables  Fresh or frozen vegetables (raw, steamed, roasted, or  grilled). Low-sodium or reduced-sodium tomato and vegetable juices. Low-sodium or reduced-sodium tomato sauce and paste. Low-sodium or reduced-sodium canned vegetables. Fruits  All fresh, canned (in natural juice), or frozen fruits. Meat and Other Protein Products  Ground beef (85% or leaner), grass-fed beef, or beef trimmed of fat. Skinless chicken or turkey. Ground chicken or turkey. Pork trimmed of fat. All fish and seafood. Eggs. Dried beans, peas, or lentils. Unsalted nuts and seeds. Unsalted canned beans. Dairy  Low-fat dairy products, such as skim or 1% milk, 2% or reduced-fat cheeses, low-fat ricotta or cottage cheese, or plain low-fat yogurt. Low-sodium or reduced-sodium cheeses. Fats and Oils  Tub margarines without trans fats. Light or reduced-fat mayonnaise and salad dressings (reduced sodium). Avocado. Safflower, olive, or canola oils. Natural peanut or almond butter. Other  Unsalted popcorn and pretzels. The items listed above may not be a complete list of recommended foods or beverages. Contact your dietitian for more options.  What foods are not recommended? Grains  White bread. White pasta. White rice. Refined cornbread. Bagels and croissants. Crackers that contain trans fat. Vegetables  Creamed or fried vegetables. Vegetables in a cheese sauce. Regular canned vegetables. Regular canned tomato sauce and paste. Regular tomato and vegetable juices. Fruits  Canned fruit in light or heavy syrup. Fruit juice. Meat and Other Protein Products  Fatty cuts of meat. Ribs, chicken wings, bacon, sausage, bologna, salami, chitterlings, fatback, hot dogs, bratwurst, and packaged luncheon meats. Salted nuts and seeds. Canned beans with salt. Dairy  Whole or 2% milk, cream, half-and-half, and cream cheese. Whole-fat or sweetened yogurt. Full-fat cheeses   or blue cheese. Nondairy creamers and whipped toppings. Processed cheese, cheese spreads, or cheese curds. Condiments  Onion and garlic  salt, seasoned salt, table salt, and sea salt. Canned and packaged gravies. Worcestershire sauce. Tartar sauce. Barbecue sauce. Teriyaki sauce. Soy sauce, including reduced sodium. Steak sauce. Fish sauce. Oyster sauce. Cocktail sauce. Horseradish. Ketchup and mustard. Meat flavorings and tenderizers. Bouillon cubes. Hot sauce. Tabasco sauce. Marinades. Taco seasonings. Relishes. Fats and Oils  Butter, stick margarine, lard, shortening, ghee, and bacon fat. Coconut, palm kernel, or palm oils. Regular salad dressings. Other  Pickles and olives. Salted popcorn and pretzels. The items listed above may not be a complete list of foods and beverages to avoid. Contact your dietitian for more information.  Where can I find more information? National Heart, Lung, and Blood Institute: www.nhlbi.nih.gov/health/health-topics/topics/dash/ This information is not intended to replace advice given to you by your health care provider. Make sure you discuss any questions you have with your health care provider. Document Released: 08/30/2011 Document Revised: 02/16/2016 Document Reviewed: 07/15/2013 Elsevier Interactive Patient Education  2017 Elsevier Inc.  

## 2016-10-31 NOTE — Progress Notes (Signed)
BP (!) 157/63   Pulse (!) 57   Temp 98.7 F (37.1 C) (Oral)   Ht _0  (1.727 m)   Wt 209 lb (94.8 kg)   BMI 31.78 kg/m    Subjective:    Patient ID: Connor Bryant, male    DOB: 09/19/1946, 71 y.o.   MRN: [id number]  HPI: Connor Bryant is a 71 y.o. male presenting on 10/31/2016 for Hypertension (When at the [facility] Friday BP was 231/91. )  This patient comes in for periodic recheck on medications and conditions. All medications are reviewed today. [facility] When he was home it came down was given clonidine. There are no reports of any problems with the medications. All of the medical conditions are reviewed and updated.  Lab work is reviewed and will be ordered as medically necessary. There are no new problems reported with today's visit.  Relevant past medical, surgical, family and social history reviewed and updated as indicated. Allergies and medications reviewed and updated.  Past Medical History:  Diagnosis Date  . Arthritis   . DM type 2 (diabetes mellitus, type 2) (HCC)    no meds  . Gallstone   . Heart murmur    was told one time he had a murmur, not since 30 years ago  . HTN (hypertension)   . Hyperlipidemia   . Personal history of colonic polyp-adenoma 11/24/2013  . Renal insufficiency    mild  . Thoracic spine fracture [facility])     Past Surgical History:  Procedure Laterality Date  . ABDOMINAL AORTAGRAM N/A 02/23/2014   Procedure: ABDOMINAL [name];  Surgeon: Conrad [place], MD;  Location: [facility] CATH LAB;  Service: Cardiovascular;  Laterality: N/A;  . COLONOSCOPY    . DEBRIDEMENT TENNIS ELBOW Right   . ESOPHAGOGASTRODUODENOSCOPY    . MESENTERIC ARTERY BYPASS N/A 03/10/2014   Procedure: AORTO TO SUPERIOR MESENTERIC ARTERY BYPASS;  Surgeon: [name], MD;  Location: [place];  Service: Vascular;  Laterality: N/A;  . TONSILLECTOMY AND ADENOIDECTOMY      Review of Systems  Allergies as of 10/31/2016   No Known Allergies       Medication List       Accurate as of 10/31/16 11:59 PM. Always use your most recent med list.          allopurinol 100 MG tablet Commonly known as:  ZYLOPRIM Take 100 mg by mouth daily.   aspirin 81 MG tablet Take 81 mg by mouth daily.   atorvastatin 20 MG tablet Commonly known as:  LIPITOR Take 20 mg by mouth daily.   hydrALAZINE 50 MG tablet Commonly known as:  APRESOLINE Take 0.5 tablets (25 mg total) by mouth 3 (three) times daily.   hydrochlorothiazide 25 MG tablet Commonly known as:  HYDRODIURIL Take 25 mg by mouth daily.   metFORMIN 500 MG 24 hr tablet Commonly known as:  GLUCOPHAGE-XR Take 1,000 mg by mouth daily with breakfast.   metoprolol 200 MG 24 hr tablet Commonly known as:  TOPROL-XL Take 1 tablet (200 mg total) by mouth daily.   pioglitazone 30 MG tablet Commonly known as:  ACTOS TAKE 1 TABLET DAILY   trandolapril 4 MG tablet Commonly known as:  MAVIK Take 4 mg by mouth daily.   verapamil 240 MG CR tablet Commonly known as:  CALAN-SR Take 480 mg by mouth daily.          Objective:    BP [place] Kitchen)  157/63   Pulse (!) 57   Temp 98.7 F (37.1 C) (Oral)   Ht _0  (1.727 m)   Wt 209 lb (94.8 kg)   BMI 31.78 kg/m   No Known Allergies  Physical Exam  Constitutional: He appears well-developed and well-nourished.  HENT:  Head: Normocephalic and atraumatic.  Eyes: Conjunctivae and EOM are normal. Pupils are equal, round, and reactive to light.  Neck: Normal range of motion. Neck supple.  Cardiovascular: Normal rate and regular rhythm.   Murmur heard. Pulses:      Carotid pulses are 1+ on the right side, and 1+ on the left side. Pulmonary/Chest: Effort normal and breath sounds normal.  Abdominal: Soft. Bowel sounds are normal.  Musculoskeletal: Normal range of motion.  Skin: Skin is warm and dry.        Assessment & Plan:   1. Peripheral vascular disease ([place]) - CMP14+EGFR; Future  2. Type 2 diabetes mellitus without complication,  without long-term current use of insulin (HCC - CMP14+EGFR; Future - Microalbumin / creatinine urine ratio; Future - Bayer DCA Hb A1c Waived; Future  3. Essential hypertension - hydrALAZINE (APRESOLINE) 50 MG tablet; Take 0.5 tablets (25 mg total) by mouth 3 (three) times daily.  Dispense: 90 tablet; Refill: 6   Continue all other maintenance medications as listed above.  Follow up plan: Return in about 4 weeks (around 11/28/2016) for recheck.  Educational handout given for [place] PA-C [facility] [address]  [place], [place] [address] [phone]   11/01/2016, 10:13 PM

## 2016-11-05 ENCOUNTER — Telehealth: Payer: Self-pay | Admitting: *Deleted

## 2016-11-05 DIAGNOSIS — I10 Essential (primary) hypertension: Secondary | ICD-10-CM

## 2016-11-06 MED ORDER — HYDRALAZINE HCL 50 MG PO TABS: 50.0000 mg | ORAL_TABLET | Freq: Three times a day (TID) | ORAL | 6 refills | Status: DC

## 2016-11-06 NOTE — Telephone Encounter (Signed)
Prescription sent to pharmacy.

## 2016-11-15 ENCOUNTER — Other Ambulatory Visit: Payer: Self-pay | Admitting: Physician Assistant

## 2016-11-23 ENCOUNTER — Other Ambulatory Visit: Payer: Self-pay | Admitting: Physician Assistant

## 2016-11-28 ENCOUNTER — Ambulatory Visit (INDEPENDENT_AMBULATORY_CARE_PROVIDER_SITE_OTHER): Payer: Medicare Other | Admitting: Physician Assistant

## 2016-11-28 ENCOUNTER — Encounter: Payer: Self-pay | Admitting: Physician Assistant

## 2016-11-28 VITALS — BP 136/59 | HR 62 | Temp 97.3°F | Ht 68.0 in | Wt 209.0 lb

## 2016-11-28 DIAGNOSIS — I739 Peripheral vascular disease, unspecified: Secondary | ICD-10-CM | POA: Diagnosis not present

## 2016-11-28 DIAGNOSIS — I10 Essential (primary) hypertension: Secondary | ICD-10-CM

## 2016-11-28 DIAGNOSIS — E119 Type 2 diabetes mellitus without complications: Secondary | ICD-10-CM | POA: Diagnosis not present

## 2016-11-28 MED ORDER — HYDRALAZINE HCL 50 MG PO TABS: 50.0000 mg | ORAL_TABLET | Freq: Three times a day (TID) | ORAL | 3 refills | Status: DC

## 2016-11-28 MED ORDER — PIOGLITAZONE HCL 30 MG PO TABS: 30.0000 mg | ORAL_TABLET | Freq: Every day | ORAL | 3 refills | Status: DC

## 2016-11-28 MED ORDER — VERAPAMIL HCL ER 240 MG PO TBCR: 480.0000 mg | EXTENDED_RELEASE_TABLET | Freq: Every day | ORAL | 3 refills | Status: DC

## 2016-11-28 MED ORDER — TRANDOLAPRIL 4 MG PO TABS: 4.0000 mg | ORAL_TABLET | Freq: Every day | ORAL | 3 refills | Status: DC

## 2016-11-28 MED ORDER — METOPROLOL SUCCINATE ER 200 MG PO TB24: 200.0000 mg | ORAL_TABLET | Freq: Every day | ORAL | 3 refills | Status: DC

## 2016-11-28 NOTE — Progress Notes (Signed)
BP (!) 136/59   Pulse 62   Temp 97.3 F (36.3 C) (Oral)   Ht _0  (1.727 m)   Wt 209 lb (94.8 kg)   BMI 31.78 kg/m    Subjective:    Patient ID: Connor Bryant, male    DOB: 1945-10-25, 71 y.o.   MRN: [id number]  HPI: KISHAUN EREKSON is a 71 y.o. male presenting on 11/28/2016 for Follow-up (4 week)  This patient comes in for periodic recheck on medications and conditions including hypertension, diabetes, peripheral vascular disease. Patient was started on new medication at last visit. His blood pressures have been greatly improved. He is not having any difficulties with his medications. We will send refills today as needed..   All medications are reviewed today. There are no reports of any problems with the medications. All of the medical conditions are reviewed and updated.  Lab work is reviewed and will be ordered as medically necessary. There are no new problems reported with today's visit.    Relevant past medical, surgical, family and social history reviewed and updated as indicated. Allergies and medications reviewed and updated.  Past Medical History:  Diagnosis Date  . Arthritis   . DM type 2 (diabetes mellitus, type 2) (HCC)    no meds  . Gallstone   . Heart murmur    was told one time he had a murmur, not since 30 years ago  . HTN (hypertension)   . Hyperlipidemia   . Personal history of colonic polyp-adenoma 11/24/2013  . Renal insufficiency    mild  . Thoracic spine fracture [facility])     Past Surgical History:  Procedure Laterality Date  . ABDOMINAL AORTAGRAM N/A 02/23/2014   Procedure: ABDOMINAL [name];  Surgeon: Conrad Walworth, MD;  Location: [facility] C[facility] LAB;  Service: Cardiovascular;  Laterality: N/A;  . COLONOSCOPY    . DEBRIDEMENT TENNIS ELBOW Right   . ESOPHAGOGASTRODUODENOSCOPY    . MESENTERIC ARTERY BYPASS N/A 03/10/2014   Procedure: AORTO TO SUPERIOR MESENTERIC ARTERY BYPASS;  Surgeon: [name], MD;  Location: [facility];  Service: Vascular;  Laterality:  N/A;  . TONSILLECTOMY AND ADENOIDECTOMY      Review of Systems  Constitutional: Negative.  Negative for appetite change and fatigue.  HENT: Negative.   Eyes: Negative.  Negative for pain and visual disturbance.  Respiratory: Negative.  Negative for cough, chest tightness, shortness of breath and wheezing.   Cardiovascular: Negative.  Negative for chest pain, palpitations and leg swelling.  Gastrointestinal: Negative.  Negative for abdominal pain, diarrhea, nausea and vomiting.  Endocrine: Negative.   Genitourinary: Negative.   Musculoskeletal: Negative.   Skin: Negative.  Negative for color change and rash.  Neurological: Negative.  Negative for weakness, numbness and headaches.  Psychiatric/Behavioral: Negative.     Allergies as of 11/28/2016   No Known Allergies     Medication List       Accurate as of 11/28/16 12:05 PM. Always use your most recent med list.          allopurinol 100 MG tablet Commonly known as:  ZYLOPRIM TAKE 1 TABLET 2 TO 3 TIMES A DAY FOR GOUT   aspirin 81 MG tablet Take 81 mg by mouth daily.   atorvastatin 20 MG tablet Commonly known as:  LIPITOR Take 20 mg by mouth daily.   hydrALAZINE 50 MG tablet Commonly known as:  APRESOLINE Take 1 tablet (50 mg total) by mouth 3 (three) times daily.   hydrochlorothiazide 25 MG  tablet Commonly known as:  HYDRODIURIL Take 25 mg by mouth daily.   metFORMIN 500 MG 24 hr tablet Commonly known as:  GLUCOPHAGE-XR Take 1,000 mg by mouth daily with breakfast.   metoprolol 200 MG 24 hr tablet Commonly known as:  TOPROL-XL Take 1 tablet (200 mg total) by mouth daily.   pioglitazone 30 MG tablet Commonly known as:  ACTOS Take 1 tablet (30 mg total) by mouth daily.   trandolapril 4 MG tablet Commonly known as:  MAVIK Take 1 tablet (4 mg total) by mouth daily.   verapamil 240 MG CR tablet Commonly known as:  CALAN-SR Take 2 tablets (480 mg total) by mouth daily.          Objective:    BP (!) 136/59    Pulse 62   Temp 97.3 F (36.3 C) (Oral)   Ht _0  (1.727 m)   Wt 209 lb (94.8 kg)   BMI 31.78 kg/m   No Known Allergies  Physical Exam  Constitutional: He appears well-developed and well-nourished.  HENT:  Head: Normocephalic and atraumatic.  Eyes: Conjunctivae and EOM are normal. Pupils are equal, round, and reactive to light.  Neck: Normal range of motion. Neck supple.  Cardiovascular: Normal rate, regular rhythm and normal heart sounds.   Pulmonary/Chest: Effort normal and breath sounds normal.  Abdominal: Soft. Bowel sounds are normal.  Musculoskeletal: Normal range of motion.  Skin: Skin is warm and dry.    Results for orders placed or performed in visit on 06/06/16  CMP14+EGFR  Result Value Ref Range   Glucose 144 (H) 65 - 99 mg/dL   BUN 21 8 - 27 mg/dL   Creatinine, Ser 0.98 0.76 - 1.27 mg/dL   GFR calc non Af Amer 77 >59 mL/min/1.73   GFR calc Af Amer 89 >59 mL/min/1.73   BUN/Creatinine Ratio 21 10 - 24   Sodium 143 134 - 144 mmol/L   Potassium 4.2 3.5 - 5.2 mmol/L   Chloride 99 96 - 106 mmol/L   CO2 26 18 - 29 mmol/L   Calcium 9.6 8.6 - 10.2 mg/dL   Total Protein 7.2 6.0 - 8.5 g/dL   Albumin 4.5 3.5 - 4.8 g/dL   Globulin, Total 2.7 1.5 - 4.5 g/dL   Albumin/Globulin Ratio 1.7 1.2 - 2.2   Bilirubin Total 0.6 0.0 - 1.2 mg/dL   Alkaline Phosphatase 79 39 - 117 IU/L   AST 13 0 - 40 IU/L   ALT 14 0 - 44 IU/L  Bayer DCA Hb A1c Waived  Result Value Ref Range   Bayer DCA Hb A1c Waived 7.7 (H) <7.0 %      Assessment & Plan:   1. Peripheral vascular disease ([place]) Follow with vascular specialist  2. Essential hypertension - trandolapril (MAVIK) 4 MG tablet; Take 1 tablet (4 mg total) by mouth daily.  Dispense: 90 tablet; Refill: 3 - verapamil (CALAN-SR) 240 MG CR tablet; Take 2 tablets (480 mg total) by mouth daily.  Dispense: 180 tablet; Refill: 3 - metoprolol (TOPROL-XL) 200 MG 24 hr tablet; Take 1 tablet (200 mg total) by mouth daily.  Dispense: 90 tablet;  Refill: 3 - hydrALAZINE (APRESOLINE) 50 MG tablet; Take 1 tablet (50 mg total) by mouth 3 (three) times daily.  Dispense: 270 tablet; Refill: 3  3. Type 2 diabetes mellitus without complication, without long-term current use of insulin (HCC) - pioglitazone (ACTOS) 30 MG tablet; Take 1 tablet (30 mg total) by mouth daily.  Dispense: 90 tablet; Refill:  3   Current Outpatient Prescriptions:  .  allopurinol (ZYLOPRIM) 100 MG tablet, TAKE 1 TABLET 2 TO 3 TIMES A DAY FOR GOUT, Disp: 90 tablet, Rfl: 0 .  aspirin 81 MG tablet, Take 81 mg by mouth daily., Disp: , Rfl:  .  atorvastatin (LIPITOR) 20 MG tablet, Take 20 mg by mouth daily., Disp: , Rfl:  .  hydrALAZINE (APRESOLINE) 50 MG tablet, Take 1 tablet (50 mg total) by mouth 3 (three) times daily., Disp: 270 tablet, Rfl: 3 .  hydrochlorothiazide (HYDRODIURIL) 25 MG tablet, Take 25 mg by mouth daily., Disp: , Rfl:  .  metFORMIN (GLUCOPHAGE-XR) 500 MG 24 hr tablet, Take 1,000 mg by mouth daily with breakfast. , Disp: , Rfl:  .  metoprolol (TOPROL-XL) 200 MG 24 hr tablet, Take 1 tablet (200 mg total) by mouth daily., Disp: 90 tablet, Rfl: 3 .  pioglitazone (ACTOS) 30 MG tablet, Take 1 tablet (30 mg total) by mouth daily., Disp: 90 tablet, Rfl: 3 .  trandolapril (MAVIK) 4 MG tablet, Take 1 tablet (4 mg total) by mouth daily., Disp: 90 tablet, Rfl: 3 .  verapamil (CALAN-SR) 240 MG CR tablet, Take 2 tablets (480 mg total) by mouth daily., Disp: 180 tablet, Rfl: 3  Continue all other maintenance medications as listed above.  Follow up plan: Return in about 3 months (around 02/28/2017).  Educational handout given for carb counting  Terald Sleeper PA-C [facility] [address]  [place], [place] [address] [phone]   11/28/2016, 12:05 PM

## 2016-11-28 NOTE — Patient Instructions (Signed)
Carbohydrate Counting for Diabetes Mellitus, Adult Carbohydrate counting is a method for keeping track of how many carbohydrates you eat. Eating carbohydrates naturally increases the amount of sugar (glucose) in the blood. Counting how many carbohydrates you eat helps keep your blood glucose within normal limits, which helps you manage your diabetes (diabetes mellitus). It is important to know how many carbohydrates you can safely have in each meal. This is different for every person. A diet and nutrition specialist (registered dietitian) can help you make a meal plan and calculate how many carbohydrates you should have at each meal and snack. Carbohydrates are found in the following foods:  Grains, such as breads and cereals.  Dried beans and soy products.  Starchy vegetables, such as potatoes, peas, and corn.  Fruit and fruit juices.  Milk and yogurt.  Sweets and snack foods, such as cake, cookies, candy, chips, and soft drinks. How do I count carbohydrates? There are two ways to count carbohydrates in food. You can use either of the methods or a combination of both. Reading "Nutrition Facts" on packaged food  The "Nutrition Facts" list is included on the labels of almost all packaged foods and beverages in the [place] It includes:  The serving size.  Information about nutrients in each serving, including the grams (g) of carbohydrate per serving. To use the "Nutrition Facts":  Decide how many servings you will have.  Multiply the number of servings by the number of carbohydrates per serving.  The resulting number is the total amount of carbohydrates that you will be having. Learning standard serving sizes of other foods  When you eat foods containing carbohydrates that are not packaged or do not include "Nutrition Facts" on the label, you need to measure the servings in order to count the amount of carbohydrates:  Measure the foods that you will eat with a food scale or measuring  cup, if needed.  Decide how many standard-size servings you will eat.  Multiply the number of servings by 15. Most carbohydrate-rich foods have about 15 g of carbohydrates per serving.  For example, if you eat 8 oz (170 g) of strawberries, you will have eaten 2 servings and 30 g of carbohydrates (2 servings x 15 g = 30 g).  For foods that have more than one food mixed, such as soups and casseroles, you must count the carbohydrates in each food that is included. The following list contains standard serving sizes of common carbohydrate-rich foods. Each of these servings has about 15 g of carbohydrates:   hamburger bun or  English muffin.   oz (15 mL) syrup.   oz (14 g) jelly.  1 slice of bread.  1 six-inch tortilla.  3 oz (85 g) cooked rice or pasta.  4 oz (113 g) cooked dried beans.  4 oz (113 g) starchy vegetable, such as peas, corn, or potatoes.  4 oz (113 g) hot cereal.  4 oz (113 g) mashed potatoes or  of a large baked potato.  4 oz (113 g) canned or frozen fruit.  4 oz (120 mL) fruit juice.  4-6 crackers.  6 chicken nuggets.  6 oz (170 g) unsweetened dry cereal.  6 oz (170 g) plain fat-free yogurt or yogurt sweetened with artificial sweeteners.  8 oz (240 mL) milk.  8 oz (170 g) fresh fruit or one small piece of fruit.  24 oz (680 g) popped popcorn. Example of carbohydrate counting Sample meal  3 oz (85 g) chicken breast.  6 oz (  170 g) brown rice.  4 oz (113 g) corn.  8 oz (240 mL) milk.  8 oz (170 g) strawberries with sugar-free whipped topping. Carbohydrate calculation 1. Identify the foods that contain carbohydrates:  Rice.  Corn.  Milk.  Strawberries. 2. Calculate how many servings you have of each food:  2 servings rice.  1 serving corn.  1 serving milk.  1 serving strawberries. 3. Multiply each number of servings by 15 g:  2 servings rice x 15 g = 30 g.  1 serving corn x 15 g = 15 g.  1 serving milk x 15 g = 15  g.  1 serving strawberries x 15 g = 15 g. 4. Add together all of the amounts to find the total grams of carbohydrates eaten:  30 g + 15 g + 15 g + 15 g = 75 g of carbohydrates total. This information is not intended to replace advice given to you by your health care provider. Make sure you discuss any questions you have with your health care provider. Document Released: 09/10/2005 Document Revised: 03/30/2016 Document Reviewed: 02/22/2016 Elsevier Interactive Patient Education  2017 [employer] Inc.  

## 2017-01-31 ENCOUNTER — Other Ambulatory Visit: Payer: Self-pay | Admitting: Physician Assistant

## 2017-02-11 ENCOUNTER — Other Ambulatory Visit: Payer: Self-pay | Admitting: Physician Assistant

## 2017-03-13 ENCOUNTER — Ambulatory Visit: Payer: Medicare Other | Admitting: Physician Assistant

## 2017-03-19 ENCOUNTER — Ambulatory Visit: Payer: Medicare Other | Admitting: Physician Assistant

## 2017-03-20 ENCOUNTER — Encounter: Payer: Self-pay | Admitting: Physician Assistant

## 2017-03-20 ENCOUNTER — Ambulatory Visit (INDEPENDENT_AMBULATORY_CARE_PROVIDER_SITE_OTHER): Payer: Medicare Other | Admitting: Physician Assistant

## 2017-03-20 VITALS — BP 164/64 | HR 56 | Temp 97.3°F | Ht 68.0 in | Wt 215.2 lb

## 2017-03-20 DIAGNOSIS — E7800 Pure hypercholesterolemia, unspecified: Secondary | ICD-10-CM

## 2017-03-20 DIAGNOSIS — R5383 Other fatigue: Secondary | ICD-10-CM

## 2017-03-20 DIAGNOSIS — I10 Essential (primary) hypertension: Secondary | ICD-10-CM | POA: Diagnosis not present

## 2017-03-20 DIAGNOSIS — E119 Type 2 diabetes mellitus without complications: Secondary | ICD-10-CM | POA: Diagnosis not present

## 2017-03-20 LAB — BAYER DCA HB A1C WAIVED: HB A1C (BAYER DCA - WAIVED): 6.3 % (ref <7.0)

## 2017-03-20 NOTE — Patient Instructions (Signed)
DASH Eating Plan DASH stands for "Dietary Approaches to Stop Hypertension." The DASH eating plan is a healthy eating plan that has been shown to reduce high blood pressure (hypertension). It may also reduce your risk for type 2 diabetes, heart disease, and stroke. The DASH eating plan may also help with weight loss. What are tips for following this plan? General guidelines  Avoid eating more than 2,300 mg (milligrams) of salt (sodium) a day. If you have hypertension, you may need to reduce your sodium intake to 1,500 mg a day.  Limit alcohol intake to no more than 1 drink a day for nonpregnant women and 2 drinks a day for men. One drink equals 12 oz of beer, 5 oz of wine, or 1 oz of hard liquor.  Work with your health care provider to maintain a healthy body weight or to lose weight. Ask what an ideal weight is for you.  Get at least 30 minutes of exercise that causes your heart to beat faster (aerobic exercise) most days of the week. Activities may include walking, swimming, or biking.  Work with your health care provider or diet and nutrition specialist (dietitian) to adjust your eating plan to your individual calorie needs. Reading food labels  Check food labels for the amount of sodium per serving. Choose foods with less than 5 percent of the Daily Value of sodium. Generally, foods with less than 300 mg of sodium per serving fit into this eating plan.  To find whole grains, look for the word "whole" as the first word in the ingredient list. Shopping  Buy products labeled as "low-sodium" or "no salt added."  Buy fresh foods. Avoid canned foods and premade or frozen meals. Cooking  Avoid adding salt when cooking. Use salt-free seasonings or herbs instead of table salt or sea salt. Check with your health care provider or pharmacist before using salt substitutes.  Do not fry foods. Cook foods using healthy methods such as baking, boiling, grilling, and broiling instead.  Cook with  heart-healthy oils, such as olive, canola, soybean, or sunflower oil. Meal planning   Eat a balanced diet that includes: ? 5 or more servings of fruits and vegetables each day. At each meal, try to fill half of your plate with fruits and vegetables. ? Up to 6-8 servings of whole grains each day. ? Less than 6 oz of lean meat, poultry, or fish each day. A 3-oz serving of meat is about the same size as a deck of cards. One egg equals 1 oz. ? 2 servings of low-fat dairy each day. ? A serving of nuts, seeds, or beans 5 times each week. ? Heart-healthy fats. Healthy fats called Omega-3 fatty acids are found in foods such as flaxseeds and coldwater fish, like sardines, salmon, and mackerel.  Limit how much you eat of the following: ? Canned or prepackaged foods. ? Food that is high in trans fat, such as fried foods. ? Food that is high in saturated fat, such as fatty meat. ? Sweets, desserts, sugary drinks, and other foods with added sugar. ? Full-fat dairy products.  Do not salt foods before eating.  Try to eat at least 2 vegetarian meals each week.  Eat more home-cooked food and less restaurant, buffet, and fast food.  When eating at a restaurant, ask that your food be prepared with less salt or no salt, if possible. What foods are recommended? The items listed may not be a complete list. Talk with your dietitian about what   dietary choices are best for you. Grains Whole-grain or whole-wheat bread. Whole-grain or whole-wheat pasta. Brown rice. Oatmeal. Quinoa. Bulgur. Whole-grain and low-sodium cereals. Pita bread. Low-fat, low-sodium crackers. Whole-wheat flour tortillas. Vegetables Fresh or frozen vegetables (raw, steamed, roasted, or grilled). Low-sodium or reduced-sodium tomato and vegetable juice. Low-sodium or reduced-sodium tomato sauce and tomato paste. Low-sodium or reduced-sodium canned vegetables. Fruits All fresh, dried, or frozen fruit. Canned fruit in natural juice (without  added sugar). Meat and other protein foods Skinless chicken or turkey. Ground chicken or turkey. Pork with fat trimmed off. Fish and seafood. Egg whites. Dried beans, peas, or lentils. Unsalted nuts, nut butters, and seeds. Unsalted canned beans. Lean cuts of beef with fat trimmed off. Low-sodium, lean deli meat. Dairy Low-fat (1%) or fat-free (skim) milk. Fat-free, low-fat, or reduced-fat cheeses. Nonfat, low-sodium ricotta or cottage cheese. Low-fat or nonfat yogurt. Low-fat, low-sodium cheese. Fats and oils Soft margarine without trans fats. Vegetable oil. Low-fat, reduced-fat, or light mayonnaise and salad dressings (reduced-sodium). Canola, safflower, olive, soybean, and sunflower oils. Avocado. Seasoning and other foods Herbs. Spices. Seasoning mixes without salt. Unsalted popcorn and pretzels. Fat-free sweets. What foods are not recommended? The items listed may not be a complete list. Talk with your dietitian about what dietary choices are best for you. Grains Baked goods made with fat, such as croissants, muffins, or some breads. Dry pasta or rice meal packs. Vegetables Creamed or fried vegetables. Vegetables in a cheese sauce. Regular canned vegetables (not low-sodium or reduced-sodium). Regular canned tomato sauce and paste (not low-sodium or reduced-sodium). Regular tomato and vegetable juice (not low-sodium or reduced-sodium). Pickles. Olives. Fruits Canned fruit in a light or heavy syrup. Fried fruit. Fruit in cream or butter sauce. Meat and other protein foods Fatty cuts of meat. Ribs. Fried meat. Bacon. Sausage. Bologna and other processed lunch meats. Salami. Fatback. Hotdogs. Bratwurst. Salted nuts and seeds. Canned beans with added salt. Canned or smoked fish. Whole eggs or egg yolks. Chicken or turkey with skin. Dairy Whole or 2% milk, cream, and half-and-half. Whole or full-fat cream cheese. Whole-fat or sweetened yogurt. Full-fat cheese. Nondairy creamers. Whipped toppings.  Processed cheese and cheese spreads. Fats and oils Butter. Stick margarine. Lard. Shortening. Ghee. Bacon fat. Tropical oils, such as coconut, palm kernel, or palm oil. Seasoning and other foods Salted popcorn and pretzels. Onion salt, garlic salt, seasoned salt, table salt, and sea salt. Worcestershire sauce. Tartar sauce. Barbecue sauce. Teriyaki sauce. Soy sauce, including reduced-sodium. Steak sauce. Canned and packaged gravies. Fish sauce. Oyster sauce. Cocktail sauce. Horseradish that you find on the shelf. Ketchup. Mustard. Meat flavorings and tenderizers. Bouillon cubes. Hot sauce and Tabasco sauce. Premade or packaged marinades. Premade or packaged taco seasonings. Relishes. Regular salad dressings. Where to find more information:  National Heart, Lung, and Blood Institute: www.nhlbi.nih.gov  American Heart Association: www.heart.org Summary  The DASH eating plan is a healthy eating plan that has been shown to reduce high blood pressure (hypertension). It may also reduce your risk for type 2 diabetes, heart disease, and stroke.  With the DASH eating plan, you should limit salt (sodium) intake to 2,300 mg a day. If you have hypertension, you may need to reduce your sodium intake to 1,500 mg a day.  When on the DASH eating plan, aim to eat more fresh fruits and vegetables, whole grains, lean proteins, low-fat dairy, and heart-healthy fats.  Work with your health care provider or diet and nutrition specialist (dietitian) to adjust your eating plan to your individual   calorie needs. This information is not intended to replace advice given to you by your health care provider. Make sure you discuss any questions you have with your health care provider. Document Released: 08/30/2011 Document Revised: 09/03/2016 Document Reviewed: 09/03/2016 Elsevier Interactive Patient Education  2017 [employer] Inc.  

## 2017-03-20 NOTE — Progress Notes (Signed)
BP (!) 169/66   Pulse (!) 56   Temp 97.3 F (36.3 C) (Oral)   Ht _0  (1.727 m)   Wt 215 lb 3.2 oz (97.6 kg)   BMI 32.72 kg/m    Subjective:    Patient ID: Connor Bryant, male    DOB: [birth year], 71 y.o.   MRN: [id number]  HPI: ZAHEER WAGEMAN is a 71 y.o. male presenting on 03/20/2017 for Follow-up (3 month )  This patient comes in for periodic recheck on medications and conditions including HTN, hyperlipid, NIDDM, fatigue.   All medications are reviewed today. There are no reports of any problems with the medications. All of the medical conditions are reviewed and updated.  Lab work is reviewed and will be ordered as medically necessary. There are no new problems reported with today's visit.  Relevant past medical, surgical, family and social history reviewed and updated as indicated. Allergies and medications reviewed and updated.  Past Medical History:  Diagnosis Date  . Arthritis   . DM type 2 (diabetes mellitus, type 2) (HCC)    no meds  . Gallstone   . Heart murmur    was told one time he had a murmur, not since 30 years ago  . HTN (hypertension)   . Hyperlipidemia   . Personal history of colonic polyp-adenoma 11/24/2013  . Renal insufficiency    mild  . Thoracic spine fracture [facility])     Past Surgical History:  Procedure Laterality Date  . ABDOMINAL AORTAGRAM N/A 02/23/2014   Procedure: ABDOMINAL [name];  Surgeon: Conrad [place], MD;  Location: [facility] CATH LAB;  Service: Cardiovascular;  Laterality: N/A;  . COLONOSCOPY    . DEBRIDEMENT TENNIS ELBOW Right   . ESOPHAGOGASTRODUODENOSCOPY    . MESENTERIC ARTERY BYPASS N/A 03/10/2014   Procedure: AORTO TO SUPERIOR MESENTERIC ARTERY BYPASS;  Surgeon: [name], MD;  Location: [place];  Service: Vascular;  Laterality: N/A;  . TONSILLECTOMY AND ADENOIDECTOMY      Review of Systems  Constitutional: Negative.  Negative for appetite change and fatigue.  HENT: Negative.   Eyes: Negative.  Negative for pain and visual  disturbance.  Respiratory: Negative.  Negative for cough, chest tightness, shortness of breath, wheezing and stridor.   Cardiovascular: Negative.  Negative for chest pain, palpitations and leg swelling.  Gastrointestinal: Negative.  Negative for abdominal pain, diarrhea, nausea and vomiting.  Endocrine: Negative.   Genitourinary: Negative.   Musculoskeletal: Positive for arthralgias and back pain.  Skin: Negative.  Negative for color change and rash.  Neurological: Negative.  Negative for weakness, numbness and headaches.  Psychiatric/Behavioral: Negative.     Allergies as of 03/20/2017   No Known Allergies     Medication List       Accurate as of 03/20/17 11:17 AM. Always use your most recent med list.          allopurinol 100 MG tablet Commonly known as:  ZYLOPRIM TAKE 1 TABLET 2 TO 3 TIMES A DAY FOR GOUT   aspirin 81 MG tablet Take 81 mg by mouth daily.   atorvastatin 20 MG tablet Commonly known as:  LIPITOR TAKE 1 TABLET DAILY   hydrALAZINE 50 MG tablet Commonly known as:  APRESOLINE Take 1 tablet (50 mg total) by mouth 3 (three) times daily.   hydrochlorothiazide 25 MG tablet Commonly known as:  HYDRODIURIL Take 25 mg by mouth daily.   metFORMIN 500 MG 24 hr tablet Commonly known as:  GLUCOPHAGE-XR Take 1,000 mg  by mouth at bedtime.   metoprolol 200 MG 24 hr tablet Commonly known as:  TOPROL-XL Take 1 tablet (200 mg total) by mouth daily.   pioglitazone 30 MG tablet Commonly known as:  ACTOS Take 1 tablet (30 mg total) by mouth daily.   trandolapril 4 MG tablet Commonly known as:  MAVIK Take 1 tablet (4 mg total) by mouth daily.   verapamil 240 MG CR tablet Commonly known as:  CALAN-SR Take 2 tablets (480 mg total) by mouth daily.          Objective:    BP (!) 169/66   Pulse (!) 56   Temp 97.3 F (36.3 C) (Oral)   Ht _0  (1.727 m)   Wt 215 lb 3.2 oz (97.6 kg)   BMI 32.72 kg/m   No Known Allergies  Physical Exam  Constitutional: He  appears well-developed and well-nourished. No distress.  HENT:  Head: Normocephalic and atraumatic.  Eyes: Conjunctivae and EOM are normal. Pupils are equal, round, and reactive to light.  Cardiovascular: Normal rate, regular rhythm and normal heart sounds.   Pulmonary/Chest: Effort normal and breath sounds normal. No respiratory distress.  Skin: Skin is warm and dry.  Psychiatric: He has a normal mood and affect. His behavior is normal.  Nursing note and vitals reviewed.   Results for orders placed or performed in visit on 06/06/16  CMP14+EGFR  Result Value Ref Range   Glucose 144 (H) 65 - 99 mg/dL   BUN 21 8 - 27 mg/dL   Creatinine, Ser 0.98 0.76 - 1.27 mg/dL   GFR calc non Af Amer 77 >59 mL/min/1.73   GFR calc Af Amer 89 >59 mL/min/1.73   BUN/Creatinine Ratio 21 10 - 24   Sodium 143 134 - 144 mmol/L   Potassium 4.2 3.5 - 5.2 mmol/L   Chloride 99 96 - 106 mmol/L   CO2 26 18 - 29 mmol/L   Calcium 9.6 8.6 - 10.2 mg/dL   Total Protein 7.2 6.0 - 8.5 g/dL   Albumin 4.5 3.5 - 4.8 g/dL   Globulin, Total 2.7 1.5 - 4.5 g/dL   Albumin/Globulin Ratio 1.7 1.2 - 2.2   Bilirubin Total 0.6 0.0 - 1.2 mg/dL   Alkaline Phosphatase 79 39 - 117 IU/L   AST 13 0 - 40 IU/L   ALT 14 0 - 44 IU/L  Bayer DCA Hb A1c Waived  Result Value Ref Range   Bayer DCA Hb A1c Waived 7.7 (H) <7.0 %      Assessment & Plan:   1. Type 2 diabetes mellitus without complication, without long-term current use of insulin (HCC) - CBC with Differential/Platelet - CMP14+EGFR - Lipid panel - Bayer DCA Hb A1c Waived  2. HTN (hypertension), malignant - CBC with Differential/Platelet - CMP14+EGFR - Lipid panel - TSH - Bayer DCA Hb A1c Waived  3. Fatigue, unspecified type - TSH - Bayer DCA Hb A1c Waived  4. Pure hypercholesterolemia - Lipid panel   Current Outpatient Prescriptions:  .  allopurinol (ZYLOPRIM) 100 MG tablet, TAKE 1 TABLET 2 TO 3 TIMES A DAY FOR GOUT (Patient taking differently: TAKE 1 TABLET  daily), Disp: 90 tablet, Rfl: 0 .  aspirin 81 MG tablet, Take 81 mg by mouth daily., Disp: , Rfl:  .  atorvastatin (LIPITOR) 20 MG tablet, TAKE 1 TABLET DAILY, Disp: 90 tablet, Rfl: 0 .  hydrALAZINE (APRESOLINE) 50 MG tablet, Take 1 tablet (50 mg total) by mouth 3 (three) times daily., Disp: 270 tablet, Rfl:  3 .  hydrochlorothiazide (HYDRODIURIL) 25 MG tablet, Take 25 mg by mouth daily., Disp: , Rfl:  .  metFORMIN (GLUCOPHAGE-XR) 500 MG 24 hr tablet, Take 1,000 mg by mouth at bedtime. , Disp: , Rfl:  .  metoprolol (TOPROL-XL) 200 MG 24 hr tablet, Take 1 tablet (200 mg total) by mouth daily., Disp: 90 tablet, Rfl: 3 .  pioglitazone (ACTOS) 30 MG tablet, Take 1 tablet (30 mg total) by mouth daily., Disp: 90 tablet, Rfl: 3 .  trandolapril (MAVIK) 4 MG tablet, Take 1 tablet (4 mg total) by mouth daily., Disp: 90 tablet, Rfl: 3 .  verapamil (CALAN-SR) 240 MG CR tablet, Take 2 tablets (480 mg total) by mouth daily., Disp: 180 tablet, Rfl: 3  Continue all other maintenance medications as listed above.  Follow up plan: Return in about 3 months (around 06/20/2017) for recheck.  Educational handout given for [place] PA-C [place] [address]  [place], [place] [address] [phone]   03/20/2017, 11:17 AM

## 2017-03-21 LAB — CMP14+EGFR
ALT: 18 IU/L (ref 0–44)
AST: 21 IU/L (ref 0–40)
Albumin/Globulin Ratio: 1.7 (ref 1.2–2.2)
Albumin: 4.6 g/dL (ref 3.5–4.8)
Alkaline Phosphatase: 75 IU/L (ref 39–117)
BILIRUBIN TOTAL: 0.4 mg/dL (ref 0.0–1.2)
BUN/Creatinine Ratio: 21 (ref 10–24)
BUN: 26 mg/dL (ref 8–27)
CALCIUM: 9.9 mg/dL (ref 8.6–10.2)
CHLORIDE: 101 mmol/L (ref 96–106)
CO2: 23 mmol/L (ref 20–29)
Creatinine, Ser: 1.21 mg/dL (ref 0.76–1.27)
GFR calc Af Amer: 69 mL/min/{1.73_m2} (ref >59)
GFR, EST NON AFRICAN AMERICAN: 59 mL/min/{1.73_m2} — AB (ref >59)
Globulin, Total: 2.7 g/dL (ref 1.5–4.5)
Glucose: 107 mg/dL — ABNORMAL HIGH (ref 65–99)
Potassium: 4.4 mmol/L (ref 3.5–5.2)
Sodium: 139 mmol/L (ref 134–144)
Total Protein: 7.3 g/dL (ref 6.0–8.5)

## 2017-03-21 LAB — CBC WITH DIFFERENTIAL/PLATELET
Basophils Absolute: 0 10*3/uL (ref 0.0–0.2)
Basos: 0 %
EOS (ABSOLUTE): 0.2 10*3/uL (ref 0.0–0.4)
Eos: 4 %
Hematocrit: 37.4 % — ABNORMAL LOW (ref 37.5–51.0)
Hemoglobin: 12.6 g/dL — ABNORMAL LOW (ref 13.0–17.7)
Immature Grans (Abs): 0 10*3/uL (ref 0.0–0.1)
Immature Granulocytes: 0 %
Lymphocytes Absolute: 2 10*3/uL (ref 0.7–3.1)
Lymphs: 31 %
MCH: 31 pg (ref 26.6–33.0)
MCHC: 33.7 g/dL (ref 31.5–35.7)
MCV: 92 fL (ref 79–97)
MONOS ABS: 0.6 10*3/uL (ref 0.1–0.9)
Monocytes: 9 %
Neutrophils Absolute: 3.6 10*3/uL (ref 1.4–7.0)
Neutrophils: 56 %
PLATELETS: 222 10*3/uL (ref 150–379)
RBC: 4.07 x10E6/uL — AB (ref 4.14–5.80)
RDW: 14.2 % (ref 12.3–15.4)
WBC: 6.4 10*3/uL (ref 3.4–10.8)

## 2017-03-21 LAB — LIPID PANEL
Chol/HDL Ratio: 1.9 ratio (ref 0.0–5.0)
Cholesterol, Total: 159 mg/dL (ref 100–199)
HDL: 82 mg/dL (ref >39)
LDL Calculated: 63 mg/dL (ref 0–99)
TRIGLYCERIDES: 69 mg/dL (ref 0–149)
VLDL Cholesterol Cal: 14 mg/dL (ref 5–40)

## 2017-03-21 LAB — TSH: TSH: 3.25 u[IU]/mL (ref 0.450–4.500)

## 2017-04-17 ENCOUNTER — Other Ambulatory Visit: Payer: Self-pay | Admitting: Physician Assistant

## 2017-05-01 ENCOUNTER — Other Ambulatory Visit: Payer: Self-pay | Admitting: Physician Assistant

## 2017-05-10 ENCOUNTER — Other Ambulatory Visit: Payer: Self-pay | Admitting: Physician Assistant

## 2017-06-20 ENCOUNTER — Other Ambulatory Visit: Payer: Self-pay | Admitting: Physician Assistant

## 2017-06-24 ENCOUNTER — Ambulatory Visit (INDEPENDENT_AMBULATORY_CARE_PROVIDER_SITE_OTHER): Payer: Medicare Other | Admitting: Physician Assistant

## 2017-06-24 ENCOUNTER — Encounter: Payer: Self-pay | Admitting: Physician Assistant

## 2017-06-24 VITALS — BP 132/57 | HR 57 | Temp 97.5°F | Ht 68.0 in | Wt 220.8 lb

## 2017-06-24 DIAGNOSIS — I10 Essential (primary) hypertension: Secondary | ICD-10-CM | POA: Diagnosis not present

## 2017-06-24 DIAGNOSIS — M25551 Pain in right hip: Secondary | ICD-10-CM

## 2017-06-24 DIAGNOSIS — Z23 Encounter for immunization: Secondary | ICD-10-CM | POA: Diagnosis not present

## 2017-06-24 DIAGNOSIS — E119 Type 2 diabetes mellitus without complications: Secondary | ICD-10-CM | POA: Diagnosis not present

## 2017-06-24 DIAGNOSIS — M25552 Pain in left hip: Secondary | ICD-10-CM | POA: Diagnosis not present

## 2017-06-24 LAB — BAYER DCA HB A1C WAIVED: HB A1C: 6 % (ref <7.0)

## 2017-06-24 NOTE — Patient Instructions (Signed)
In a few days you may receive a survey in the mail or online from [employer] regarding your visit with us today. Please take a moment to fill this out. Your feedback is very important to our whole office. It can help us better understand your needs as well as improve your experience and satisfaction. Thank you for taking your time to complete it. We care about you.  Tangelia Sanson, PA-C  

## 2017-06-24 NOTE — Progress Notes (Signed)
BP (!) 132/57   Pulse (!) 57   Temp (!) 97.5 F (36.4 C) (Oral)   Ht 5' 8"  (1.727 m)   Wt 220 lb 12.8 oz (100.2 kg)   BMI 33.57 kg/m    Subjective:    Patient ID: Connor Bryant, male    DOB: 30-Dec-1945, 71 y.o.   MRN: [id number]  HPI: Connor Bryant is a 71 y.o. male presenting on 06/24/2017 for Follow-up (3 month ); Hypertension; and Diabetes This patient comes in for periodic recheck on medications and conditions including hypertension, lipid, diabetes. Overall feeling well.  He is having bilateral hip pain especially after walking a far distance.  Will go to ortho soon. Once he has decided to go he will let us know.  All medications are reviewed today. There are no reports of any problems with the medications. All of the medical conditions are reviewed and updated.  Lab work is reviewed and will be ordered as medically necessary. There are no new problems reported with today's visit.  Relevant past medical, surgical, family and social history reviewed and updated as indicated. Allergies and medications reviewed and updated.  Past Medical History:  Diagnosis Date  . Arthritis   . DM type 2 (diabetes mellitus, type 2) (HCC)    no meds  . Gallstone   . Heart murmur    was told one time he had a murmur, not since 30 years ago  . HTN (hypertension)   . Hyperlipidemia   . Personal history of colonic polyp-adenoma 11/24/2013  . Renal insufficiency    mild  . Thoracic spine fracture [facility])     Past Surgical History:  Procedure Laterality Date  . ABDOMINAL AORTAGRAM N/A 02/23/2014   Procedure: ABDOMINAL [name];  Surgeon: Conrad Byron, MD;  Location: [facility] CATH LAB;  Service: Cardiovascular;  Laterality: N/A;  . COLONOSCOPY    . DEBRIDEMENT TENNIS ELBOW Right   . ESOPHAGOGASTRODUODENOSCOPY    . MESENTERIC ARTERY BYPASS N/A 03/10/2014   Procedure: AORTO TO SUPERIOR MESENTERIC ARTERY BYPASS;  Surgeon: [name], MD;  Location: [place];  Service: Vascular;  Laterality: N/A;  .  TONSILLECTOMY AND ADENOIDECTOMY      Review of Systems  Constitutional: Negative.  Negative for appetite change and fatigue.  HENT: Negative.   Eyes: Negative.  Negative for pain and visual disturbance.  Respiratory: Negative.  Negative for cough, chest tightness, shortness of breath and wheezing.   Cardiovascular: Negative.  Negative for chest pain, palpitations and leg swelling.  Gastrointestinal: Negative.  Negative for abdominal pain, diarrhea, nausea and vomiting.  Endocrine: Negative.   Genitourinary: Negative.   Musculoskeletal: Positive for arthralgias and gait problem.  Skin: Negative.  Negative for color change and rash.  Neurological: Negative for weakness, numbness and headaches.  Psychiatric/Behavioral: Negative.     Allergies as of 06/24/2017   No Known Allergies     Medication List       Accurate as of 06/24/17 11:30 AM. Always use your most recent med list.          allopurinol 100 MG tablet Commonly known as:  ZYLOPRIM TAKE 1 TABLET 2 TO 3 TIMES A DAY FOR GOUT   aspirin 81 MG tablet Take 81 mg by mouth daily.   atorvastatin 20 MG tablet Commonly known as:  LIPITOR TAKE 1 TABLET DAILY   hydrALAZINE 50 MG tablet Commonly known as:  APRESOLINE Take 1 tablet (50 mg total) by mouth 3 (three) times daily.  hydrochlorothiazide 25 MG tablet Commonly known as:  HYDRODIURIL TAKE 1 OR 2 TABLETS DAILY IN THE MORNING AS NEEDED   metFORMIN 500 MG 24 hr tablet Commonly known as:  GLUCOPHAGE-XR 2 tablet with evening meal Once a day   metoprolol 200 MG 24 hr tablet Commonly known as:  TOPROL-XL Take 1 tablet (200 mg total) by mouth daily.   pioglitazone 30 MG tablet Commonly known as:  ACTOS Take 1 tablet (30 mg total) by mouth daily.   trandolapril 4 MG tablet Commonly known as:  MAVIK Take 1 tablet (4 mg total) by mouth daily.   verapamil 240 MG CR tablet Commonly known as:  CALAN-SR Take 2 tablets (480 mg total) by mouth daily.            Objective:    BP (!) 132/57   Pulse (!) 57   Temp (!) 97.5 F (36.4 C) (Oral)   Ht 5' 8"  (1.727 m)   Wt 220 lb 12.8 oz (100.2 kg)   BMI 33.57 kg/m   No Known Allergies  Physical Exam  Constitutional: He appears well-developed and well-nourished. No distress.  HENT:  Head: Normocephalic and atraumatic.  Eyes: Pupils are equal, round, and reactive to light. Conjunctivae and EOM are normal.  Neck: Normal range of motion. Neck supple.  Cardiovascular: Normal rate, regular rhythm and normal heart sounds.   Pulmonary/Chest: Effort normal and breath sounds normal. No respiratory distress.  Abdominal: Soft. Bowel sounds are normal.  Musculoskeletal:       Right hip: He exhibits decreased range of motion. He exhibits normal strength and no deformity.       Left hip: He exhibits decreased range of motion. He exhibits no crepitus and no deformity.  Skin: Skin is warm and dry.  Psychiatric: He has a normal mood and affect. His behavior is normal.  Nursing note and vitals reviewed.   Results for orders placed or performed in visit on 03/20/17  CBC with Differential/Platelet  Result Value Ref Range   WBC 6.4 3.4 - 10.8 x10E3/uL   RBC 4.07 (L) 4.14 - 5.80 x10E6/uL   Hemoglobin 12.6 (L) 13.0 - 17.7 g/dL   Hematocrit 37.4 (L) 37.5 - 51.0 %   MCV 92 79 - 97 fL   MCH 31.0 26.6 - 33.0 pg   MCHC 33.7 31.5 - 35.7 g/dL   RDW 14.2 12.3 - 15.4 %   Platelets 222 150 - 379 x10E3/uL   Neutrophils 56 Not Estab. %   Lymphs 31 Not Estab. %   Monocytes 9 Not Estab. %   Eos 4 Not Estab. %   Basos 0 Not Estab. %   Neutrophils Absolute 3.6 1.4 - 7.0 x10E3/uL   Lymphocytes Absolute 2.0 0.7 - 3.1 x10E3/uL   Monocytes Absolute 0.6 0.1 - 0.9 x10E3/uL   EOS (ABSOLUTE) 0.2 0.0 - 0.4 x10E3/uL   Basophils Absolute 0.0 0.0 - 0.2 x10E3/uL   Immature Granulocytes 0 Not Estab. %   Immature Grans (Abs) 0.0 0.0 - 0.1 x10E3/uL  CMP14+EGFR  Result Value Ref Range   Glucose 107 (H) 65 - 99 mg/dL   BUN 26 8 -  27 mg/dL   Creatinine, Ser 1.21 0.76 - 1.27 mg/dL   GFR calc non Af Amer 59 (L) >59 mL/min/1.73   GFR calc Af Amer 69 >59 mL/min/1.73   BUN/Creatinine Ratio 21 10 - 24   Sodium 139 134 - 144 mmol/L   Potassium 4.4 3.5 - 5.2 mmol/L   Chloride 101 96 -  106 mmol/L   CO2 23 20 - 29 mmol/L   Calcium 9.9 8.6 - 10.2 mg/dL   Total Protein 7.3 6.0 - 8.5 g/dL   Albumin 4.6 3.5 - 4.8 g/dL   Globulin, Total 2.7 1.5 - 4.5 g/dL   Albumin/Globulin Ratio 1.7 1.2 - 2.2   Bilirubin Total 0.4 0.0 - 1.2 mg/dL   Alkaline Phosphatase 75 39 - 117 IU/L   AST 21 0 - 40 IU/L   ALT 18 0 - 44 IU/L  Lipid panel  Result Value Ref Range   Cholesterol, Total 159 100 - 199 mg/dL   Triglycerides 69 0 - 149 mg/dL   HDL 82 >39 mg/dL   VLDL Cholesterol Cal 14 5 - 40 mg/dL   LDL Calculated 63 0 - 99 mg/dL   Chol/HDL Ratio 1.9 0.0 - 5.0 ratio  TSH  Result Value Ref Range   TSH 3.250 0.450 - 4.500 uIU/mL  Bayer DCA Hb A1c Waived  Result Value Ref Range   Bayer DCA Hb A1c Waived 6.3 <7.0 %      Assessment & Plan:   1. Essential hypertension - Bayer DCA Hb A1c Waived  2. Type 2 diabetes mellitus without complication, without long-term current use of insulin (HCC) - Bayer DCA Hb A1c Waived  3. Bilateral hip pain    Current Outpatient Prescriptions:  .  allopurinol (ZYLOPRIM) 100 MG tablet, TAKE 1 TABLET 2 TO 3 TIMES A DAY FOR GOUT, Disp: 90 tablet, Rfl: 0 .  aspirin 81 MG tablet, Take 81 mg by mouth daily., Disp: , Rfl:  .  atorvastatin (LIPITOR) 20 MG tablet, TAKE 1 TABLET DAILY, Disp: 90 tablet, Rfl: 1 .  hydrALAZINE (APRESOLINE) 50 MG tablet, Take 1 tablet (50 mg total) by mouth 3 (three) times daily., Disp: 270 tablet, Rfl: 3 .  hydrochlorothiazide (HYDRODIURIL) 25 MG tablet, TAKE 1 OR 2 TABLETS DAILY IN THE MORNING AS NEEDED, Disp: 180 tablet, Rfl: 0 .  metFORMIN (GLUCOPHAGE-XR) 500 MG 24 hr tablet, 2 tablet with evening meal Once a day, Disp: 180 tablet, Rfl: 1 .  metoprolol (TOPROL-XL) 200 MG 24 hr  tablet, Take 1 tablet (200 mg total) by mouth daily., Disp: 90 tablet, Rfl: 3 .  pioglitazone (ACTOS) 30 MG tablet, Take 1 tablet (30 mg total) by mouth daily., Disp: 90 tablet, Rfl: 3 .  trandolapril (MAVIK) 4 MG tablet, Take 1 tablet (4 mg total) by mouth daily., Disp: 90 tablet, Rfl: 3 .  verapamil (CALAN-SR) 240 MG CR tablet, Take 2 tablets (480 mg total) by mouth daily., Disp: 180 tablet, Rfl: 3 Continue all other maintenance medications as listed above.  Follow up plan: Return in about 3 months (around 09/24/2017) for recheck.  Educational handout given for [place] PA-C [place] [address]  [place], [place] [address] [phone]   06/24/2017, 11:30 AM

## 2017-08-07 ENCOUNTER — Other Ambulatory Visit: Payer: Self-pay | Admitting: Physician Assistant

## 2017-09-19 ENCOUNTER — Other Ambulatory Visit: Payer: Self-pay | Admitting: Physician Assistant

## 2017-10-15 ENCOUNTER — Other Ambulatory Visit: Payer: Self-pay | Admitting: Physician Assistant

## 2017-10-29 ENCOUNTER — Other Ambulatory Visit: Payer: Self-pay | Admitting: Physician Assistant

## 2017-11-07 ENCOUNTER — Other Ambulatory Visit: Payer: Self-pay | Admitting: Physician Assistant

## 2017-11-22 ENCOUNTER — Encounter: Payer: Self-pay | Admitting: Physician Assistant

## 2017-11-22 ENCOUNTER — Ambulatory Visit: Payer: Medicare Other | Admitting: Physician Assistant

## 2017-11-22 VITALS — BP 116/54 | HR 50 | Temp 98.0°F | Ht 68.0 in | Wt 221.0 lb

## 2017-11-22 DIAGNOSIS — I10 Essential (primary) hypertension: Secondary | ICD-10-CM | POA: Diagnosis not present

## 2017-11-22 DIAGNOSIS — R52 Pain, unspecified: Secondary | ICD-10-CM

## 2017-11-22 DIAGNOSIS — J019 Acute sinusitis, unspecified: Secondary | ICD-10-CM

## 2017-11-22 DIAGNOSIS — E119 Type 2 diabetes mellitus without complications: Secondary | ICD-10-CM

## 2017-11-22 LAB — VERITOR FLU A/B WAIVED
INFLUENZA B: NEGATIVE
Influenza A: NEGATIVE

## 2017-11-22 MED ORDER — VERAPAMIL HCL ER 240 MG PO TBCR: 480.0000 mg | EXTENDED_RELEASE_TABLET | Freq: Every day | ORAL | 3 refills | Status: DC

## 2017-11-22 MED ORDER — METFORMIN HCL ER 500 MG PO TB24: ORAL_TABLET | ORAL | 3 refills | Status: DC

## 2017-11-22 MED ORDER — PIOGLITAZONE HCL 30 MG PO TABS: 30.0000 mg | ORAL_TABLET | Freq: Every day | ORAL | 3 refills | Status: DC

## 2017-11-22 MED ORDER — AZITHROMYCIN 250 MG PO TABS: ORAL_TABLET | ORAL | 0 refills | Status: DC

## 2017-11-22 MED ORDER — METOPROLOL SUCCINATE ER 200 MG PO TB24: 200.0000 mg | ORAL_TABLET | Freq: Every day | ORAL | 3 refills | Status: DC

## 2017-11-22 MED ORDER — HYDRALAZINE HCL 50 MG PO TABS: 50.0000 mg | ORAL_TABLET | Freq: Three times a day (TID) | ORAL | 3 refills | Status: DC

## 2017-11-22 MED ORDER — TRANDOLAPRIL 4 MG PO TABS: 4.0000 mg | ORAL_TABLET | Freq: Every day | ORAL | 3 refills | Status: DC

## 2017-11-22 MED ORDER — HYDROCHLOROTHIAZIDE 25 MG PO TABS: ORAL_TABLET | ORAL | 3 refills | Status: DC

## 2017-11-22 NOTE — Progress Notes (Signed)
BP (!) 116/54   Pulse (!) 50   Temp 98 F (36.7 C) (Oral)   Ht 5\' 8"  (1.727 m)   Wt 221 lb (100.2 kg)   BMI 33.60 kg/m    Subjective:    Patient ID: Connor Bryant, male    DOB: [birth year], 72 y.o.   MRN: [id number]  HPI: Connor Bryant is a 72 y.o. male presenting on 11/22/2017 for Sore Throat; Cough; watery eyes; and Generalized Body Aches  This patient has had many days of sinus headache and postnasal drainage. There is copious drainage at times. Denies any fever at this time. There has been a history of sinus infections in the past.  No history of sinus surgery. There is cough at night. It has become more prevalent in recent days.   Past Medical History:  Diagnosis Date  . Arthritis   . DM type 2 (diabetes mellitus, type 2) (HCC)    no meds  . Gallstone   . Heart murmur    was told one time he had a murmur, not since 30 years ago  . HTN (hypertension)   . Hyperlipidemia   . Personal history of colonic polyp-adenoma 11/24/2013  . Renal insufficiency    mild  . Thoracic spine fracture [facility])    Relevant past medical, surgical, family and social history reviewed and updated as indicated. Interim medical history since our last visit reviewed. Allergies and medications reviewed and updated. DATA REVIEWED: CHART IN EPIC  Family History reviewed for pertinent findings.  Review of Systems  Constitutional: Positive for fatigue. Negative for activity change, appetite change and fever.  HENT: Positive for congestion, sinus pressure, sinus pain and sore throat.   Eyes: Negative.  Negative for pain and visual disturbance.  Respiratory: Negative for cough, chest tightness, shortness of breath and wheezing.   Cardiovascular: Negative.  Negative for chest pain, palpitations and leg swelling.  Gastrointestinal: Negative for abdominal pain, diarrhea, nausea and vomiting.  Endocrine: Negative.   Genitourinary: Negative.   Musculoskeletal: Negative for arthralgias, back pain and  myalgias.  Skin: Negative.  Negative for color change and rash.  Neurological: Positive for headaches. Negative for weakness and numbness.  Psychiatric/Behavioral: Negative.     Allergies as of 11/22/2017   No Known Allergies     Medication List        Accurate as of 11/22/17  8:43 AM. Always use your most recent med list.          allopurinol 100 MG tablet Commonly known as:  ZYLOPRIM TAKE 1 TABLET 2 TO 3 TIMES A DAY FOR GOUT   aspirin 81 MG tablet Take 81 mg by mouth daily.   atorvastatin 20 MG tablet Commonly known as:  LIPITOR TAKE 1 TABLET DAILY   azithromycin 250 MG tablet Commonly known as:  ZITHROMAX Z-PAK Take as directed   hydrALAZINE 50 MG tablet Commonly known as:  APRESOLINE Take 1 tablet (50 mg total) by mouth 3 (three) times daily.   hydrochlorothiazide 25 MG tablet Commonly known as:  HYDRODIURIL TAKE 1 OR 2 TABLETS DAILY IN THE MORNING AS NEEDED   metFORMIN 500 MG 24 hr tablet Commonly known as:  GLUCOPHAGE-XR 2 tablet with evening meal Once a day   metoprolol 200 MG 24 hr tablet Commonly known as:  TOPROL-XL Take 1 tablet (200 mg total) by mouth daily.   pioglitazone 30 MG tablet Commonly known as:  ACTOS Take 1 tablet (30 mg total) by mouth daily.   trandolapril  4 MG tablet Commonly known as:  MAVIK Take 1 tablet (4 mg total) by mouth daily.   verapamil 240 MG CR tablet Commonly known as:  CALAN-SR Take 2 tablets (480 mg total) by mouth daily.          Objective:    BP (!) 116/54   Pulse (!) 50   Temp 98 F (36.7 C) (Oral)   Ht 5\' 8"  (1.727 m)   Wt 221 lb (100.2 kg)   BMI 33.60 kg/m   No Known Allergies  Wt Readings from Last 3 Encounters:  11/22/17 221 lb (100.2 kg)  06/24/17 220 lb 12.8 oz (100.2 kg)  03/20/17 215 lb 3.2 oz (97.6 kg)    Physical Exam  Constitutional: He is oriented to person, place, and time. He appears well-developed and well-nourished.  HENT:  Head: Normocephalic and atraumatic.  Right Ear:  Tympanic membrane and external ear normal. No middle ear effusion.  Left Ear: Tympanic membrane and external ear normal.  No middle ear effusion.  Nose: Mucosal edema and rhinorrhea present. Right sinus exhibits frontal sinus tenderness. Right sinus exhibits no maxillary sinus tenderness. Left sinus exhibits frontal sinus tenderness. Left sinus exhibits no maxillary sinus tenderness.  Mouth/Throat: Uvula is midline. Posterior oropharyngeal erythema present.  Eyes: Conjunctivae and EOM are normal. Pupils are equal, round, and reactive to light. Right eye exhibits no discharge. Left eye exhibits no discharge.  Neck: Normal range of motion.  Cardiovascular: Normal rate, regular rhythm and normal heart sounds.  Pulmonary/Chest: Effort normal and breath sounds normal. No respiratory distress. He has no wheezes.  Abdominal: Soft.  Lymphadenopathy:    He has no cervical adenopathy.  Neurological: He is alert and oriented to person, place, and time.  Skin: Skin is warm and dry.  Psychiatric: He has a normal mood and affect.    Results for orders placed or performed in visit on 06/24/17  Bayer DCA Hb A1c Waived  Result Value Ref Range   Bayer DCA Hb A1c Waived 6.0 <7.0 %      Assessment & Plan:   1. Essential hypertension - hydrALAZINE (APRESOLINE) 50 MG tablet; Take 1 tablet (50 mg total) by mouth 3 (three) times daily.  Dispense: 270 tablet; Refill: 3 - metoprolol (TOPROL-XL) 200 MG 24 hr tablet; Take 1 tablet (200 mg total) by mouth daily.  Dispense: 90 tablet; Refill: 3 - trandolapril (MAVIK) 4 MG tablet; Take 1 tablet (4 mg total) by mouth daily.  Dispense: 90 tablet; Refill: 3 - verapamil (CALAN-SR) 240 MG CR tablet; Take 2 tablets (480 mg total) by mouth daily.  Dispense: 180 tablet; Refill: 3  2. Type 2 diabetes mellitus without complication, without long-term current use of insulin (HCC) - pioglitazone (ACTOS) 30 MG tablet; Take 1 tablet (30 mg total) by mouth daily.  Dispense: 90  tablet; Refill: 3  3. Body aches - Veritor Flu A/B Waived  4. Acute non-recurrent sinusitis, unspecified location - azithromycin (ZITHROMAX Z-PAK) 250 MG tablet; Take as directed  Dispense: 6 each; Refill: 0   Continue all other maintenance medications as listed above.  Follow up plan: Return in about 4 months (around 03/24/2018) for recheck.  Educational handout given for [place] PA-C [place] [address]  [place], [place] [address] [phone]   11/22/2017, 8:43 AM

## 2017-12-13 DIAGNOSIS — H2513 Age-related nuclear cataract, bilateral: Secondary | ICD-10-CM | POA: Diagnosis not present

## 2017-12-13 DIAGNOSIS — E7800 Pure hypercholesterolemia, unspecified: Secondary | ICD-10-CM | POA: Diagnosis not present

## 2017-12-13 DIAGNOSIS — I10 Essential (primary) hypertension: Secondary | ICD-10-CM | POA: Diagnosis not present

## 2017-12-13 DIAGNOSIS — H524 Presbyopia: Secondary | ICD-10-CM | POA: Diagnosis not present

## 2017-12-13 DIAGNOSIS — E119 Type 2 diabetes mellitus without complications: Secondary | ICD-10-CM | POA: Diagnosis not present

## 2017-12-13 LAB — HM DIABETES EYE EXAM

## 2017-12-30 ENCOUNTER — Ambulatory Visit: Payer: Medicare Other | Admitting: Physician Assistant

## 2018-01-28 ENCOUNTER — Other Ambulatory Visit: Payer: Self-pay | Admitting: Physician Assistant

## 2018-02-06 ENCOUNTER — Other Ambulatory Visit: Payer: Self-pay | Admitting: Physician Assistant

## 2018-02-24 ENCOUNTER — Encounter: Payer: Self-pay | Admitting: Physician Assistant

## 2018-02-24 ENCOUNTER — Ambulatory Visit (INDEPENDENT_AMBULATORY_CARE_PROVIDER_SITE_OTHER): Payer: Medicare Other

## 2018-02-24 ENCOUNTER — Ambulatory Visit: Payer: Medicare Other | Admitting: Physician Assistant

## 2018-02-24 VITALS — BP 165/55 | HR 67 | Temp 98.1°F | Ht 68.0 in | Wt 227.0 lb

## 2018-02-24 DIAGNOSIS — Z0000 Encounter for general adult medical examination without abnormal findings: Secondary | ICD-10-CM

## 2018-02-24 DIAGNOSIS — I700 Atherosclerosis of aorta: Secondary | ICD-10-CM | POA: Insufficient documentation

## 2018-02-24 DIAGNOSIS — E119 Type 2 diabetes mellitus without complications: Secondary | ICD-10-CM

## 2018-02-24 DIAGNOSIS — M545 Low back pain, unspecified: Secondary | ICD-10-CM

## 2018-02-24 DIAGNOSIS — E7800 Pure hypercholesterolemia, unspecified: Secondary | ICD-10-CM

## 2018-02-24 DIAGNOSIS — I10 Essential (primary) hypertension: Secondary | ICD-10-CM | POA: Diagnosis not present

## 2018-02-24 LAB — BAYER DCA HB A1C WAIVED: HB A1C: 6.4 % (ref <7.0)

## 2018-02-25 DIAGNOSIS — E7800 Pure hypercholesterolemia, unspecified: Secondary | ICD-10-CM | POA: Insufficient documentation

## 2018-02-25 LAB — CBC WITH DIFFERENTIAL/PLATELET
BASOS ABS: 0 10*3/uL (ref 0.0–0.2)
Basos: 0 %
EOS (ABSOLUTE): 0.3 10*3/uL (ref 0.0–0.4)
Eos: 6 %
Hematocrit: 36.4 % — ABNORMAL LOW (ref 37.5–51.0)
Hemoglobin: 11.7 g/dL — ABNORMAL LOW (ref 13.0–17.7)
Immature Grans (Abs): 0 10*3/uL (ref 0.0–0.1)
Immature Granulocytes: 0 %
LYMPHS ABS: 1.8 10*3/uL (ref 0.7–3.1)
LYMPHS: 30 %
MCH: 30.4 pg (ref 26.6–33.0)
MCHC: 32.1 g/dL (ref 31.5–35.7)
MCV: 95 fL (ref 79–97)
Monocytes Absolute: 0.5 10*3/uL (ref 0.1–0.9)
Monocytes: 9 %
NEUTROS ABS: 3.2 10*3/uL (ref 1.4–7.0)
Neutrophils: 55 %
PLATELETS: 215 10*3/uL (ref 150–450)
RBC: 3.85 x10E6/uL — ABNORMAL LOW (ref 4.14–5.80)
RDW: 15.3 % (ref 12.3–15.4)
WBC: 5.9 10*3/uL (ref 3.4–10.8)

## 2018-02-25 LAB — CMP14+EGFR
ALT: 19 IU/L (ref 0–44)
AST: 15 IU/L (ref 0–40)
Albumin/Globulin Ratio: 1.9 (ref 1.2–2.2)
Albumin: 4.4 g/dL (ref 3.5–4.8)
Alkaline Phosphatase: 85 IU/L (ref 39–117)
BILIRUBIN TOTAL: 0.3 mg/dL (ref 0.0–1.2)
BUN / CREAT RATIO: 25 — AB (ref 10–24)
BUN: 31 mg/dL — AB (ref 8–27)
CHLORIDE: 103 mmol/L (ref 96–106)
CO2: 22 mmol/L (ref 20–29)
Calcium: 9.1 mg/dL (ref 8.6–10.2)
Creatinine, Ser: 1.24 mg/dL (ref 0.76–1.27)
GFR calc Af Amer: 66 mL/min/{1.73_m2} (ref >59)
GFR calc non Af Amer: 57 mL/min/{1.73_m2} — ABNORMAL LOW (ref >59)
GLUCOSE: 138 mg/dL — AB (ref 65–99)
Globulin, Total: 2.3 g/dL (ref 1.5–4.5)
Potassium: 4.7 mmol/L (ref 3.5–5.2)
Sodium: 139 mmol/L (ref 134–144)
Total Protein: 6.7 g/dL (ref 6.0–8.5)

## 2018-02-25 LAB — LIPID PANEL
CHOL/HDL RATIO: 2.4 ratio (ref 0.0–5.0)
Cholesterol, Total: 157 mg/dL (ref 100–199)
HDL: 65 mg/dL (ref >39)
LDL Calculated: 76 mg/dL (ref 0–99)
Triglycerides: 79 mg/dL (ref 0–149)
VLDL Cholesterol Cal: 16 mg/dL (ref 5–40)

## 2018-02-25 LAB — PSA: Prostate Specific Ag, Serum: 0.5 ng/mL (ref 0.0–4.0)

## 2018-02-25 NOTE — Progress Notes (Signed)
BP (!) 165/55   Pulse 67   Temp 98.1 F (36.7 C) (Oral)   Ht 5' 8"  (1.727 m)   Wt 227 lb (103 kg)   BMI 34.52 kg/m    Subjective:    Patient ID: Connor Bryant, male    DOB: 1945/10/25, 72 y.o.   MRN: [id number]  HPI: ZEEK ROSTRON is a 72 y.o. male presenting on 02/24/2018 for Hypertension and Diabetes  This patient comes in for periodic recheck on medications and conditions including diabetes and hypertension.   All medications are reviewed today. There are no reports of any problems with the medications. All of the medical conditions are reviewed and updated.  Lab work is reviewed and will be ordered as medically necessary.   Reports that he has had more low back pain, especially after he has been standing more.  He does have a known thoracic compression fracture from several years ago.  Past Medical History:  Diagnosis Date  . Arthritis   . DM type 2 (diabetes mellitus, type 2) (HCC)    no meds  . Gallstone   . Heart murmur    was told one time he had a murmur, not since 30 years ago  . HTN (hypertension)   . Hyperlipidemia   . Personal history of colonic polyp-adenoma 11/24/2013  . Renal insufficiency    mild  . Thoracic spine fracture [facility])    Relevant past medical, surgical, family and social history reviewed and updated as indicated. Interim medical history since our last visit reviewed. Allergies and medications reviewed and updated. DATA REVIEWED: CHART IN EPIC  Family History reviewed for pertinent findings.  Review of Systems  Constitutional: Negative.  Negative for appetite change and fatigue.  HENT: Negative.   Eyes: Negative.  Negative for pain and visual disturbance.  Respiratory: Negative.  Negative for cough, chest tightness, shortness of breath and wheezing.   Cardiovascular: Negative.  Negative for chest pain, palpitations and leg swelling.  Gastrointestinal: Negative.  Negative for abdominal pain, diarrhea, nausea and vomiting.  Endocrine:  Negative.   Genitourinary: Negative.   Musculoskeletal: Positive for back pain and myalgias.  Skin: Negative.  Negative for color change and rash.  Neurological: Negative.  Negative for weakness, numbness and headaches.  Psychiatric/Behavioral: Negative.     Allergies as of 02/24/2018   No Known Allergies     Medication List        Accurate as of 02/24/18 11:59 PM. Always use your most recent med list.          allopurinol 100 MG tablet Commonly known as:  ZYLOPRIM TAKE 1 TABLET 2 TO 3 TIMES A DAY FOR GOUT   aspirin 81 MG tablet Take 81 mg by mouth daily.   atorvastatin 20 MG tablet Commonly known as:  LIPITOR TAKE 1 TABLET DAILY   hydrALAZINE 50 MG tablet Commonly known as:  APRESOLINE Take 1 tablet (50 mg total) by mouth 3 (three) times daily.   hydrochlorothiazide 25 MG tablet Commonly known as:  HYDRODIURIL TAKE 1 OR 2 TABLETS DAILY IN THE MORNING AS NEEDED   metFORMIN 500 MG 24 hr tablet Commonly known as:  GLUCOPHAGE-XR 2 tablet with evening meal Once a day   metoprolol 200 MG 24 hr tablet Commonly known as:  TOPROL-XL Take 1 tablet (200 mg total) by mouth daily.   pioglitazone 30 MG tablet Commonly known as:  ACTOS Take 1 tablet (30 mg total) by mouth daily.   trandolapril 4 MG tablet  Commonly known as:  MAVIK Take 1 tablet (4 mg total) by mouth daily.   verapamil 240 MG CR tablet Commonly known as:  CALAN-SR Take 2 tablets (480 mg total) by mouth daily.          Objective:    BP (!) 165/55   Pulse 67   Temp 98.1 F (36.7 C) (Oral)   Ht 5' 8"  (1.727 m)   Wt 227 lb (103 kg)   BMI 34.52 kg/m   No Known Allergies  Wt Readings from Last 3 Encounters:  02/24/18 227 lb (103 kg)  11/22/17 221 lb (100.2 kg)  06/24/17 220 lb 12.8 oz (100.2 kg)    Physical Exam  Constitutional: He appears well-developed and well-nourished. No distress.  HENT:  Head: Normocephalic and atraumatic.  Eyes: Pupils are equal, round, and reactive to light.  Conjunctivae and EOM are normal.  Cardiovascular: Normal rate, regular rhythm and normal heart sounds.  Pulmonary/Chest: Effort normal and breath sounds normal. No respiratory distress.  Musculoskeletal:       Lumbar back: He exhibits decreased range of motion, tenderness and pain.       Back:  Skin: Skin is warm and dry.  Psychiatric: He has a normal mood and affect. His behavior is normal.  Nursing note and vitals reviewed.   Results for orders placed or performed in visit on 02/24/18  CBC with Differential/Platelet  Result Value Ref Range   WBC 5.9 3.4 - 10.8 x10E3/uL   RBC 3.85 (L) 4.14 - 5.80 x10E6/uL   Hemoglobin 11.7 (L) 13.0 - 17.7 g/dL   Hematocrit 36.4 (L) 37.5 - 51.0 %   MCV 95 79 - 97 fL   MCH 30.4 26.6 - 33.0 pg   MCHC 32.1 31.5 - 35.7 g/dL   RDW 15.3 12.3 - 15.4 %   Platelets 215 150 - 450 x10E3/uL   Neutrophils 55 Not Estab. %   Lymphs 30 Not Estab. %   Monocytes 9 Not Estab. %   Eos 6 Not Estab. %   Basos 0 Not Estab. %   Neutrophils Absolute 3.2 1.4 - 7.0 x10E3/uL   Lymphocytes Absolute 1.8 0.7 - 3.1 x10E3/uL   Monocytes Absolute 0.5 0.1 - 0.9 x10E3/uL   EOS (ABSOLUTE) 0.3 0.0 - 0.4 x10E3/uL   Basophils Absolute 0.0 0.0 - 0.2 x10E3/uL   Immature Granulocytes 0 Not Estab. %   Immature Grans (Abs) 0.0 0.0 - 0.1 x10E3/uL  CMP14+EGFR  Result Value Ref Range   Glucose 138 (H) 65 - 99 mg/dL   BUN 31 (H) 8 - 27 mg/dL   Creatinine, Ser 1.24 0.76 - 1.27 mg/dL   GFR calc non Af Amer 57 (L) >59 mL/min/1.73   GFR calc Af Amer 66 >59 mL/min/1.73   BUN/Creatinine Ratio 25 (H) 10 - 24   Sodium 139 134 - 144 mmol/L   Potassium 4.7 3.5 - 5.2 mmol/L   Chloride 103 96 - 106 mmol/L   CO2 22 20 - 29 mmol/L   Calcium 9.1 8.6 - 10.2 mg/dL   Total Protein 6.7 6.0 - 8.5 g/dL   Albumin 4.4 3.5 - 4.8 g/dL   Globulin, Total 2.3 1.5 - 4.5 g/dL   Albumin/Globulin Ratio 1.9 1.2 - 2.2   Bilirubin Total 0.3 0.0 - 1.2 mg/dL   Alkaline Phosphatase 85 39 - 117 IU/L   AST 15 0 - 40  IU/L   ALT 19 0 - 44 IU/L  Lipid panel  Result Value Ref Range  Cholesterol, Total 157 100 - 199 mg/dL   Triglycerides 79 0 - 149 mg/dL   HDL 65 >39 mg/dL   VLDL Cholesterol Cal 16 5 - 40 mg/dL   LDL Calculated 76 0 - 99 mg/dL   Chol/HDL Ratio 2.4 0.0 - 5.0 ratio  PSA  Result Value Ref Range   Prostate Specific Ag, Serum 0.5 0.0 - 4.0 ng/mL  Bayer DCA Hb A1c Waived  Result Value Ref Range   HB A1C (BAYER DCA - WAIVED) 6.4 <7.0 %      Assessment & Plan:   1. Type 2 diabetes mellitus without complication, without long-term current use of insulin (HCC) - CBC with Differential/Platelet - CMP14+EGFR - Bayer DCA Hb A1c Waived  2. Essential hypertension - CBC with Differential/Platelet - CMP14+EGFR  3. Pure hypercholesterolemia - Lipid panel  4. Well adult exam - PSA  5. Lumbar pain - DG Lumbar Spine 2-3 Views; Future   Continue all other maintenance medications as listed above.  Follow up plan: Return in about 6 months (around 08/26/2018) for recheck.  Educational handout given for [place] PA-C [place] [address]  [place],  [address] [phone]   02/25/2018, 8:27 AM

## 2018-05-07 ENCOUNTER — Other Ambulatory Visit: Payer: Self-pay | Admitting: Physician Assistant

## 2018-05-21 ENCOUNTER — Encounter: Payer: Self-pay | Admitting: Physician Assistant

## 2018-05-21 ENCOUNTER — Ambulatory Visit: Payer: Medicare Other | Admitting: Physician Assistant

## 2018-05-21 ENCOUNTER — Ambulatory Visit (INDEPENDENT_AMBULATORY_CARE_PROVIDER_SITE_OTHER): Payer: Medicare Other

## 2018-05-21 VITALS — BP 173/68 | HR 58 | Temp 98.2°F | Ht 68.0 in | Wt 223.0 lb

## 2018-05-21 DIAGNOSIS — I10 Essential (primary) hypertension: Secondary | ICD-10-CM | POA: Diagnosis not present

## 2018-05-21 DIAGNOSIS — R809 Proteinuria, unspecified: Secondary | ICD-10-CM | POA: Diagnosis not present

## 2018-05-21 DIAGNOSIS — M21962 Unspecified acquired deformity of left lower leg: Secondary | ICD-10-CM

## 2018-05-21 DIAGNOSIS — M79672 Pain in left foot: Secondary | ICD-10-CM

## 2018-05-21 DIAGNOSIS — E119 Type 2 diabetes mellitus without complications: Secondary | ICD-10-CM

## 2018-05-21 MED ORDER — NAPROXEN 500 MG PO TABS: 500.0000 mg | ORAL_TABLET | Freq: Two times a day (BID) | ORAL | 1 refills | Status: DC

## 2018-05-26 NOTE — Progress Notes (Signed)
BP (!) 173/68   Pulse (!) 58   Temp 98.2 F (36.8 C) (Oral)   Ht _0  (1.727 m)   Wt 223 lb (101.2 kg)   BMI 33.91 kg/m    Subjective:    Patient ID: Connor Bryant, male    DOB: Apr 28, 1946, 72 y.o.   MRN: [id number]  HPI: Connor Bryant is a 72 y.o. male presenting on 05/21/2018 for Foot Pain (left )  This patient comes in with left lateral foot pain.  Is been going on for several days.  It is just gotten worse when he is weightbearing.  He denies any acute injury.  He has not changed shoes or running there was work.  And there is no other findings at this time.  Past Medical History:  Diagnosis Date  . Arthritis   . DM type 2 (diabetes mellitus, type 2) (HCC)    no meds  . Gallstone   . Heart murmur    was told one time he had a murmur, not since 30 years ago  . HTN (hypertension)   . Hyperlipidemia   . Personal history of colonic polyp-adenoma 11/24/2013  . Renal insufficiency    mild  . Thoracic spine fracture [facility])    Relevant past medical, surgical, family and social history reviewed and updated as indicated. Interim medical history since our last visit reviewed. Allergies and medications reviewed and updated. DATA REVIEWED: CHART IN EPIC  Family History reviewed for pertinent findings.  Review of Systems  Constitutional: Negative.  Negative for appetite change and fatigue.  Eyes: Negative for pain and visual disturbance.  Respiratory: Negative.  Negative for cough, chest tightness, shortness of breath and wheezing.   Cardiovascular: Negative.  Negative for chest pain, palpitations and leg swelling.  Gastrointestinal: Negative.  Negative for abdominal pain, diarrhea, nausea and vomiting.  Genitourinary: Negative.   Musculoskeletal: Positive for arthralgias, gait problem and joint swelling.  Skin: Negative.  Negative for color change and rash.  Neurological: Negative for weakness, numbness and headaches.  Psychiatric/Behavioral: Negative.     Allergies as  of 05/21/2018   No Known Allergies     Medication List        Accurate as of 05/21/18 11:59 PM. Always use your most recent med list.          allopurinol 100 MG tablet Commonly known as:  ZYLOPRIM TAKE 1 TABLET 2 TO 3 TIMES A DAY FOR GOUT   aspirin 81 MG tablet Take 81 mg by mouth daily.   atorvastatin 20 MG tablet Commonly known as:  LIPITOR TAKE 1 TABLET DAILY   hydrALAZINE 50 MG tablet Commonly known as:  APRESOLINE Take 1 tablet (50 mg total) by mouth 3 (three) times daily.   hydrochlorothiazide 25 MG tablet Commonly known as:  HYDRODIURIL TAKE 1 OR 2 TABLETS DAILY IN THE MORNING AS NEEDED   metFORMIN 500 MG 24 hr tablet Commonly known as:  GLUCOPHAGE-XR 2 tablet with evening meal Once a day   metoprolol 200 MG 24 hr tablet Commonly known as:  TOPROL-XL Take 1 tablet (200 mg total) by mouth daily.   naproxen 500 MG tablet Commonly known as:  NAPROSYN Take 1 tablet (500 mg total) by mouth 2 (two) times daily with a meal.   pioglitazone 30 MG tablet Commonly known as:  ACTOS Take 1 tablet (30 mg total) by mouth daily.   trandolapril 4 MG tablet Commonly known as:  MAVIK Take 1 tablet (4 mg  total) by mouth daily.   verapamil 240 MG CR tablet Commonly known as:  CALAN-SR Take 2 tablets (480 mg total) by mouth daily.          Objective:    BP (!) 173/68   Pulse (!) 58   Temp 98.2 F (36.8 C) (Oral)   Ht _0  (1.727 m)   Wt 223 lb (101.2 kg)   BMI 33.91 kg/m   No Known Allergies  Wt Readings from Last 3 Encounters:  05/21/18 223 lb (101.2 kg)  02/24/18 227 lb (103 kg)  11/22/17 221 lb (100.2 kg)    Physical Exam  Constitutional: He appears well-developed and well-nourished. No distress.  HENT:  Head: Normocephalic and atraumatic.  Eyes: Pupils are equal, round, and reactive to light. Conjunctivae and EOM are normal.  Cardiovascular: Normal rate, regular rhythm and normal heart sounds.  Pulmonary/Chest: Effort normal and breath sounds  normal. No respiratory distress.  Musculoskeletal:       Left foot: There is tenderness, bony tenderness and deformity.       Feet:  Skin: Skin is warm and dry.  Psychiatric: He has a normal mood and affect. His behavior is normal.  Nursing note and vitals reviewed.       Assessment & Plan:   1. Left foot pain - DG Foot Complete Left; Future - naproxen (NAPROSYN) 500 MG tablet; Take 1 tablet (500 mg total) by mouth 2 (two) times daily with a meal.  Dispense: 60 tablet; Refill: 1  2. Deformity of left foot - DG Foot Complete Left; Future  3. Type 2 diabetes mellitus without complication, without long-term current use of insulin (HCC) - Microalbumin / creatinine urine ratio; Future - CMP14+EGFR; Future  4. Essential hypertension - Microalbumin / creatinine urine ratio; Future - CMP14+EGFR; Future  5. Proteinuria, unspecified type - Microalbumin / creatinine urine ratio; Future - CMP14+EGFR; Future   Continue all other maintenance medications as listed above.  Follow up plan: No follow-ups on file.  Educational handout given for [place] PA-C [place] [address]  [place], [place] [address] [phone]   05/26/2018, 8:19 PM

## 2018-08-04 ENCOUNTER — Other Ambulatory Visit: Payer: Self-pay | Admitting: Physician Assistant

## 2018-08-26 ENCOUNTER — Encounter: Payer: Self-pay | Admitting: Physician Assistant

## 2018-08-26 ENCOUNTER — Ambulatory Visit: Payer: Medicare Other | Admitting: Physician Assistant

## 2018-08-26 VITALS — BP 158/59 | HR 56 | Temp 97.2°F | Ht 68.0 in | Wt 224.4 lb

## 2018-08-26 DIAGNOSIS — E119 Type 2 diabetes mellitus without complications: Secondary | ICD-10-CM

## 2018-08-26 DIAGNOSIS — M5440 Lumbago with sciatica, unspecified side: Secondary | ICD-10-CM | POA: Insufficient documentation

## 2018-08-26 DIAGNOSIS — Z23 Encounter for immunization: Secondary | ICD-10-CM | POA: Diagnosis not present

## 2018-08-26 DIAGNOSIS — I10 Essential (primary) hypertension: Secondary | ICD-10-CM

## 2018-08-26 DIAGNOSIS — G8929 Other chronic pain: Secondary | ICD-10-CM | POA: Insufficient documentation

## 2018-08-26 DIAGNOSIS — M5442 Lumbago with sciatica, left side: Secondary | ICD-10-CM | POA: Diagnosis not present

## 2018-08-26 DIAGNOSIS — R251 Tremor, unspecified: Secondary | ICD-10-CM

## 2018-08-26 LAB — CBC WITH DIFFERENTIAL/PLATELET
Basophils Absolute: 0 10*3/uL (ref 0.0–0.2)
Basos: 0 %
EOS (ABSOLUTE): 0.3 10*3/uL (ref 0.0–0.4)
Eos: 5 %
Hematocrit: 35.3 % — ABNORMAL LOW (ref 37.5–51.0)
Hemoglobin: 11.8 g/dL — ABNORMAL LOW (ref 13.0–17.7)
Immature Grans (Abs): 0 10*3/uL (ref 0.0–0.1)
Immature Granulocytes: 0 %
Lymphocytes Absolute: 1.5 10*3/uL (ref 0.7–3.1)
Lymphs: 25 %
MCH: 30.6 pg (ref 26.6–33.0)
MCHC: 33.4 g/dL (ref 31.5–35.7)
MCV: 92 fL (ref 79–97)
Monocytes Absolute: 0.5 10*3/uL (ref 0.1–0.9)
Monocytes: 9 %
Neutrophils Absolute: 3.4 10*3/uL (ref 1.4–7.0)
Neutrophils: 61 %
Platelets: 193 10*3/uL (ref 150–450)
RBC: 3.86 x10E6/uL — ABNORMAL LOW (ref 4.14–5.80)
RDW: 14.2 % (ref 12.3–15.4)
WBC: 5.7 10*3/uL (ref 3.4–10.8)

## 2018-08-26 LAB — LIPID PANEL
Chol/HDL Ratio: 2.4 ratio (ref 0.0–5.0)
Cholesterol, Total: 163 mg/dL (ref 100–199)
HDL: 67 mg/dL (ref >39)
LDL Calculated: 81 mg/dL (ref 0–99)
Triglycerides: 73 mg/dL (ref 0–149)
VLDL Cholesterol Cal: 15 mg/dL (ref 5–40)

## 2018-08-26 LAB — CMP14+EGFR
ALT: 23 IU/L (ref 0–44)
AST: 16 IU/L (ref 0–40)
Albumin/Globulin Ratio: 1.6 (ref 1.2–2.2)
Albumin: 4.3 g/dL (ref 3.5–4.8)
Alkaline Phosphatase: 73 IU/L (ref 39–117)
BUN/Creatinine Ratio: 24 (ref 10–24)
BUN: 33 mg/dL — ABNORMAL HIGH (ref 8–27)
Bilirubin Total: 0.3 mg/dL (ref 0.0–1.2)
CO2: 23 mmol/L (ref 20–29)
Calcium: 9.5 mg/dL (ref 8.6–10.2)
Chloride: 104 mmol/L (ref 96–106)
Creatinine, Ser: 1.38 mg/dL — ABNORMAL HIGH (ref 0.76–1.27)
GFR calc Af Amer: 58 mL/min/{1.73_m2} — ABNORMAL LOW (ref >59)
GFR calc non Af Amer: 50 mL/min/{1.73_m2} — ABNORMAL LOW (ref >59)
GLOBULIN, TOTAL: 2.7 g/dL (ref 1.5–4.5)
Glucose: 140 mg/dL — ABNORMAL HIGH (ref 65–99)
Potassium: 4.3 mmol/L (ref 3.5–5.2)
SODIUM: 142 mmol/L (ref 134–144)
Total Protein: 7 g/dL (ref 6.0–8.5)

## 2018-08-26 LAB — BAYER DCA HB A1C WAIVED: HB A1C (BAYER DCA - WAIVED): 6.2 % (ref <7.0)

## 2018-08-26 MED ORDER — HYDROCHLOROTHIAZIDE 25 MG PO TABS: ORAL_TABLET | ORAL | 3 refills | Status: DC

## 2018-08-26 MED ORDER — PIOGLITAZONE HCL 30 MG PO TABS: 30.0000 mg | ORAL_TABLET | Freq: Every day | ORAL | 3 refills | Status: DC

## 2018-08-26 MED ORDER — LORATADINE 10 MG PO TABS: 10.0000 mg | ORAL_TABLET | Freq: Every day | ORAL | 3 refills | Status: DC

## 2018-08-26 MED ORDER — HYDRALAZINE HCL 50 MG PO TABS: 50.0000 mg | ORAL_TABLET | Freq: Three times a day (TID) | ORAL | 3 refills | Status: DC

## 2018-08-26 MED ORDER — PRIMIDONE 50 MG PO TABS: 50.0000 mg | ORAL_TABLET | Freq: Three times a day (TID) | ORAL | 6 refills | Status: DC

## 2018-08-26 MED ORDER — TRANDOLAPRIL 4 MG PO TABS: 4.0000 mg | ORAL_TABLET | Freq: Every day | ORAL | 3 refills | Status: DC

## 2018-08-26 MED ORDER — VERAPAMIL HCL ER 240 MG PO TBCR: 480.0000 mg | EXTENDED_RELEASE_TABLET | Freq: Every day | ORAL | 3 refills | Status: DC

## 2018-08-26 MED ORDER — METFORMIN HCL ER 500 MG PO TB24: ORAL_TABLET | ORAL | 3 refills | Status: DC

## 2018-08-26 MED ORDER — METOPROLOL SUCCINATE ER 200 MG PO TB24: 200.0000 mg | ORAL_TABLET | Freq: Every day | ORAL | 3 refills | Status: DC

## 2018-08-26 NOTE — Progress Notes (Signed)
BP (!) 158/59   Pulse (!) 56   Temp (!) 97.2 F (36.2 C) (Oral)   Ht _0  (1.727 m)   Wt 224 lb 6.4 oz (101.8 kg)   BMI 34.12 kg/m    Subjective:    Patient ID: Connor Bryant, male    DOB: [birth year], 72 y.o.   MRN: [id number]  HPI: ONOFRE GAINS is a 72 y.o. male presenting on 08/26/2018 for Diabetes (6 month) and Hypertension  This patient comes in for periodic recheck on medications and conditions including hypertension, type 2 diabetes controlled and chronic low back pain.  For 2 years now he has had daily back pain that goes across his lower low back.  He is having increased radicular pains in the last sciatic area.  X-rays have been performed and he does have known arthritis in the spine.  However in the past couple of months the pain is become extremely severe at times.  He has to sit down and is not able to walk nearly as far.  Think it is time for [place] to do an MRI. Sister just had back surgery with Dr, Justin Mend. If needed would like to see him.  Finally his mild tremor is starting to get a little bit worse.  He had tried a beta-blocker in the past and felt too tired on it.  We have discussed the possibility of primidone at a low dose.  He is going to try that this next month.   All medications are reviewed today. There are no reports of any problems with the medications. All of the medical conditions are reviewed and updated.  Lab work is reviewed and will be ordered as medically necessary. There are no new problems reported with today's visit.   Past Medical History:  Diagnosis Date  . Arthritis   . DM type 2 (diabetes mellitus, type 2) (HCC)    no meds  . Gallstone   . Heart murmur    was told one time he had a murmur, not since 30 years ago  . HTN (hypertension)   . Hyperlipidemia   . Personal history of colonic polyp-adenoma 11/24/2013  . Renal insufficiency    mild  . Thoracic spine fracture [facility])    Relevant past medical, surgical, family and social history  reviewed and updated as indicated. Interim medical history since our last visit reviewed. Allergies and medications reviewed and updated. DATA REVIEWED: CHART IN EPIC  Family History reviewed for pertinent findings.  Review of Systems  Constitutional: Negative.  Negative for appetite change and fatigue.  HENT: Negative.   Eyes: Negative.  Negative for pain and visual disturbance.  Respiratory: Negative.  Negative for cough, chest tightness, shortness of breath and wheezing.   Cardiovascular: Negative.  Negative for chest pain, palpitations and leg swelling.  Gastrointestinal: Negative.  Negative for abdominal pain, diarrhea, nausea and vomiting.  Endocrine: Negative.   Genitourinary: Negative.   Musculoskeletal: Positive for arthralgias, back pain, gait problem and myalgias.  Skin: Negative.  Negative for color change and rash.  Neurological: Negative for weakness, numbness and headaches.  Psychiatric/Behavioral: Negative.     Allergies as of 08/26/2018   No Known Allergies     Medication List        Accurate as of 08/26/18 10:43 AM. Always use your most recent med list.          allopurinol 100 MG tablet Commonly known as:  ZYLOPRIM TAKE 1 TABLET 2 TO 3 TIMES A  DAY FOR GOUT   allopurinol 100 MG tablet Commonly known as:  ZYLOPRIM TAKE 1 TABLET 2 TO 3 TIMES A DAY FOR GOUT   aspirin 81 MG tablet Take 81 mg by mouth daily.   atorvastatin 20 MG tablet Commonly known as:  LIPITOR TAKE 1 TABLET DAILY   hydrALAZINE 50 MG tablet Commonly known as:  APRESOLINE Take 1 tablet (50 mg total) by mouth 3 (three) times daily.   hydrochlorothiazide 25 MG tablet Commonly known as:  HYDRODIURIL TAKE 1 OR 2 TABLETS DAILY IN THE MORNING AS NEEDED   loratadine 10 MG tablet Commonly known as:  CLARITIN Take 1 tablet (10 mg total) by mouth daily.   metFORMIN 500 MG 24 hr tablet Commonly known as:  GLUCOPHAGE-XR 2 tablet with evening meal Once a day   metoprolol 200 MG 24 hr  tablet Commonly known as:  TOPROL-XL Take 1 tablet (200 mg total) by mouth daily.   naproxen 500 MG tablet Commonly known as:  NAPROSYN Take 1 tablet (500 mg total) by mouth 2 (two) times daily with a meal.   pioglitazone 30 MG tablet Commonly known as:  ACTOS Take 1 tablet (30 mg total) by mouth daily.   primidone 50 MG tablet Commonly known as:  MYSOLINE Take 1 tablet (50 mg total) by mouth 3 (three) times daily.   trandolapril 4 MG tablet Commonly known as:  MAVIK Take 1 tablet (4 mg total) by mouth daily.   verapamil 240 MG CR tablet Commonly known as:  CALAN-SR Take 2 tablets (480 mg total) by mouth daily.          Objective:    BP (!) 158/59   Pulse (!) 56   Temp (!) 97.2 F (36.2 C) (Oral)   Ht _0  (1.727 m)   Wt 224 lb 6.4 oz (101.8 kg)   BMI 34.12 kg/m   No Known Allergies  Wt Readings from Last 3 Encounters:  08/26/18 224 lb 6.4 oz (101.8 kg)  05/21/18 223 lb (101.2 kg)  02/24/18 227 lb (103 kg)    Physical Exam  Constitutional: He appears well-developed and well-nourished. No distress.  HENT:  Head: Normocephalic and atraumatic.  Eyes: Pupils are equal, round, and reactive to light. Conjunctivae and EOM are normal.  Cardiovascular: Normal rate, regular rhythm and normal heart sounds.  Pulmonary/Chest: Effort normal and breath sounds normal. No respiratory distress.  Musculoskeletal:       Lumbar back: He exhibits tenderness, pain and spasm.       Back:  Skin: Skin is warm and dry.  Psychiatric: He has a normal mood and affect. His behavior is normal.  Nursing note and vitals reviewed.   Results for orders placed or performed in visit on 02/24/18  CBC with Differential/Platelet  Result Value Ref Range   WBC 5.9 3.4 - 10.8 x10E3/uL   RBC 3.85 (L) 4.14 - 5.80 x10E6/uL   Hemoglobin 11.7 (L) 13.0 - 17.7 g/dL   Hematocrit 36.4 (L) 37.5 - 51.0 %   MCV 95 79 - 97 fL   MCH 30.4 26.6 - 33.0 pg   MCHC 32.1 31.5 - 35.7 g/dL   RDW 15.3 12.3 -  15.4 %   Platelets 215 150 - 450 x10E3/uL   Neutrophils 55 Not Estab. %   Lymphs 30 Not Estab. %   Monocytes 9 Not Estab. %   Eos 6 Not Estab. %   Basos 0 Not Estab. %   Neutrophils Absolute 3.2 1.4 - 7.0  x10E3/uL   Lymphocytes Absolute 1.8 0.7 - 3.1 x10E3/uL   Monocytes Absolute 0.5 0.1 - 0.9 x10E3/uL   EOS (ABSOLUTE) 0.3 0.0 - 0.4 x10E3/uL   Basophils Absolute 0.0 0.0 - 0.2 x10E3/uL   Immature Granulocytes 0 Not Estab. %   Immature Grans (Abs) 0.0 0.0 - 0.1 x10E3/uL  CMP14+EGFR  Result Value Ref Range   Glucose 138 (H) 65 - 99 mg/dL   BUN 31 (H) 8 - 27 mg/dL   Creatinine, Ser 1.24 0.76 - 1.27 mg/dL   GFR calc non Af Amer 57 (L) >59 mL/min/1.73   GFR calc Af Amer 66 >59 mL/min/1.73   BUN/Creatinine Ratio 25 (H) 10 - 24   Sodium 139 134 - 144 mmol/L   Potassium 4.7 3.5 - 5.2 mmol/L   Chloride 103 96 - 106 mmol/L   CO2 22 20 - 29 mmol/L   Calcium 9.1 8.6 - 10.2 mg/dL   Total Protein 6.7 6.0 - 8.5 g/dL   Albumin 4.4 3.5 - 4.8 g/dL   Globulin, Total 2.3 1.5 - 4.5 g/dL   Albumin/Globulin Ratio 1.9 1.2 - 2.2   Bilirubin Total 0.3 0.0 - 1.2 mg/dL   Alkaline Phosphatase 85 39 - 117 IU/L   AST 15 0 - 40 IU/L   ALT 19 0 - 44 IU/L  Lipid panel  Result Value Ref Range   Cholesterol, Total 157 100 - 199 mg/dL   Triglycerides 79 0 - 149 mg/dL   HDL 65 >39 mg/dL   VLDL Cholesterol Cal 16 5 - 40 mg/dL   LDL Calculated 76 0 - 99 mg/dL   Chol/HDL Ratio 2.4 0.0 - 5.0 ratio  PSA  Result Value Ref Range   Prostate Specific Ag, Serum 0.5 0.0 - 4.0 ng/mL  Bayer DCA Hb A1c Waived  Result Value Ref Range   HB A1C (BAYER DCA - WAIVED) 6.4 <7.0 %      Assessment & Plan:   1. Essential hypertension - hydrALAZINE (APRESOLINE) 50 MG tablet; Take 1 tablet (50 mg total) by mouth 3 (three) times daily.  Dispense: 270 tablet; Refill: 3 - metoprolol (TOPROL-XL) 200 MG 24 hr tablet; Take 1 tablet (200 mg total) by mouth daily.  Dispense: 90 tablet; Refill: 3 - trandolapril (MAVIK) 4 MG tablet;  Take 1 tablet (4 mg total) by mouth daily.  Dispense: 90 tablet; Refill: 3 - verapamil (CALAN-SR) 240 MG CR tablet; Take 2 tablets (480 mg total) by mouth daily.  Dispense: 180 tablet; Refill: 3 - CBC with Differential/Platelet - CMP14+EGFR - Lipid panel - Bayer DCA Hb A1c Waived  2. Type 2 diabetes mellitus without complication, without long-term current use of insulin (HCC) - pioglitazone (ACTOS) 30 MG tablet; Take 1 tablet (30 mg total) by mouth daily.  Dispense: 90 tablet; Refill: 3 - CBC with Differential/Platelet - CMP14+EGFR - Lipid panel - Bayer DCA Hb A1c Waived  3. Back pain of lumbar region with sciatica - MR Lumbar Spine Wo Contrast; Future  4. Chronic left-sided low back pain with left-sided sciatica - MR Lumbar Spine Wo Contrast; Future  5. Tremor - primidone (MYSOLINE) 50 MG tablet; Take 1 tablet (50 mg total) by mouth 3 (three) times daily.  Dispense: 90 tablet; Refill: 6  6. Encounter for immunization - Flu vaccine HIGH DOSE PF   Continue all other maintenance medications as listed above.  Follow up plan: Return in about 6 months (around 02/25/2019) for recheck and labs.  Educational handout given for [employer].  [place] [address]  [place], [place] [address] [phone]   08/26/2018, 10:43 AM\

## 2018-08-29 ENCOUNTER — Other Ambulatory Visit (HOSPITAL_COMMUNITY): Payer: Medicare HMO

## 2018-09-30 ENCOUNTER — Other Ambulatory Visit: Payer: Self-pay

## 2018-09-30 ENCOUNTER — Emergency Department (HOSPITAL_COMMUNITY): Payer: Medicare Other

## 2018-09-30 ENCOUNTER — Encounter (HOSPITAL_COMMUNITY): Payer: Self-pay | Admitting: Emergency Medicine

## 2018-09-30 ENCOUNTER — Inpatient Hospital Stay (HOSPITAL_COMMUNITY)
Admission: EM | Admit: 2018-09-30 | Discharge: 2018-10-02 | DRG: 390 | Disposition: A | Discharge status: Home / Self Care | Payer: Medicare Other | Attending: Internal Medicine | Admitting: Internal Medicine

## 2018-09-30 DIAGNOSIS — Z8719 Personal history of other diseases of the digestive system: Secondary | ICD-10-CM | POA: Diagnosis not present

## 2018-09-30 DIAGNOSIS — Z833 Family history of diabetes mellitus: Secondary | ICD-10-CM

## 2018-09-30 DIAGNOSIS — Z8052 Family history of malignant neoplasm of bladder: Secondary | ICD-10-CM | POA: Diagnosis not present

## 2018-09-30 DIAGNOSIS — Z8249 Family history of ischemic heart disease and other diseases of the circulatory system: Secondary | ICD-10-CM | POA: Diagnosis not present

## 2018-09-30 DIAGNOSIS — Z8349 Family history of other endocrine, nutritional and metabolic diseases: Secondary | ICD-10-CM | POA: Diagnosis not present

## 2018-09-30 DIAGNOSIS — Z7982 Long term (current) use of aspirin: Secondary | ICD-10-CM

## 2018-09-30 DIAGNOSIS — E1165 Type 2 diabetes mellitus with hyperglycemia: Secondary | ICD-10-CM | POA: Diagnosis present

## 2018-09-30 DIAGNOSIS — E785 Hyperlipidemia, unspecified: Secondary | ICD-10-CM | POA: Diagnosis not present

## 2018-09-30 DIAGNOSIS — N183 Chronic kidney disease, stage 3 (moderate): Secondary | ICD-10-CM | POA: Diagnosis present

## 2018-09-30 DIAGNOSIS — R011 Cardiac murmur, unspecified: Secondary | ICD-10-CM | POA: Diagnosis present

## 2018-09-30 DIAGNOSIS — K5651 Intestinal adhesions [bands], with partial obstruction: Principal | ICD-10-CM | POA: Diagnosis present

## 2018-09-30 DIAGNOSIS — R1084 Generalized abdominal pain: Secondary | ICD-10-CM | POA: Diagnosis not present

## 2018-09-30 DIAGNOSIS — I10 Essential (primary) hypertension: Secondary | ICD-10-CM | POA: Diagnosis present

## 2018-09-30 DIAGNOSIS — Z6830 Body mass index (BMI) 30.0-30.9, adult: Secondary | ICD-10-CM

## 2018-09-30 DIAGNOSIS — E1151 Type 2 diabetes mellitus with diabetic peripheral angiopathy without gangrene: Secondary | ICD-10-CM | POA: Diagnosis not present

## 2018-09-30 DIAGNOSIS — Z7984 Long term (current) use of oral hypoglycemic drugs: Secondary | ICD-10-CM

## 2018-09-30 DIAGNOSIS — K56609 Unspecified intestinal obstruction, unspecified as to partial versus complete obstruction: Secondary | ICD-10-CM | POA: Diagnosis not present

## 2018-09-30 DIAGNOSIS — I739 Peripheral vascular disease, unspecified: Secondary | ICD-10-CM | POA: Diagnosis present

## 2018-09-30 DIAGNOSIS — M1990 Unspecified osteoarthritis, unspecified site: Secondary | ICD-10-CM | POA: Diagnosis present

## 2018-09-30 DIAGNOSIS — N289 Disorder of kidney and ureter, unspecified: Secondary | ICD-10-CM | POA: Diagnosis not present

## 2018-09-30 DIAGNOSIS — E1122 Type 2 diabetes mellitus with diabetic chronic kidney disease: Secondary | ICD-10-CM | POA: Diagnosis not present

## 2018-09-30 DIAGNOSIS — E860 Dehydration: Secondary | ICD-10-CM | POA: Diagnosis present

## 2018-09-30 DIAGNOSIS — Z87891 Personal history of nicotine dependence: Secondary | ICD-10-CM

## 2018-09-30 DIAGNOSIS — E7800 Pure hypercholesterolemia, unspecified: Secondary | ICD-10-CM | POA: Diagnosis present

## 2018-09-30 DIAGNOSIS — E119 Type 2 diabetes mellitus without complications: Secondary | ICD-10-CM

## 2018-09-30 DIAGNOSIS — I129 Hypertensive chronic kidney disease with stage 1 through stage 4 chronic kidney disease, or unspecified chronic kidney disease: Secondary | ICD-10-CM | POA: Diagnosis not present

## 2018-09-30 LAB — COMPREHENSIVE METABOLIC PANEL
ALT: 26 U/L (ref 0–44)
AST: 23 U/L (ref 15–41)
Albumin: 4.5 g/dL (ref 3.5–5.0)
Alkaline Phosphatase: 66 U/L (ref 38–126)
Anion gap: 10 (ref 5–15)
BUN: 41 mg/dL — ABNORMAL HIGH (ref 8–23)
CHLORIDE: 104 mmol/L (ref 98–111)
CO2: 25 mmol/L (ref 22–32)
Calcium: 10.1 mg/dL (ref 8.9–10.3)
Creatinine, Ser: 1.53 mg/dL — ABNORMAL HIGH (ref 0.61–1.24)
GFR calc Af Amer: 52 mL/min — ABNORMAL LOW (ref >60)
GFR calc non Af Amer: 44 mL/min — ABNORMAL LOW (ref >60)
Glucose, Bld: 181 mg/dL — ABNORMAL HIGH (ref 70–99)
Potassium: 4.3 mmol/L (ref 3.5–5.1)
Sodium: 139 mmol/L (ref 135–145)
Total Bilirubin: 0.7 mg/dL (ref 0.3–1.2)
Total Protein: 8 g/dL (ref 6.5–8.1)

## 2018-09-30 LAB — CBC
HCT: 42.9 % (ref 39.0–52.0)
Hemoglobin: 13.5 g/dL (ref 13.0–17.0)
MCH: 29.8 pg (ref 26.0–34.0)
MCHC: 31.5 g/dL (ref 30.0–36.0)
MCV: 94.7 fL (ref 80.0–100.0)
Platelets: 213 10*3/uL (ref 150–400)
RBC: 4.53 MIL/uL (ref 4.22–5.81)
RDW: 14.7 % (ref 11.5–15.5)
WBC: 9.2 10*3/uL (ref 4.0–10.5)
nRBC: 0 % (ref 0.0–0.2)

## 2018-09-30 LAB — GLUCOSE, CAPILLARY
Glucose-Capillary: 121 mg/dL — ABNORMAL HIGH (ref 70–99)
Glucose-Capillary: 110 mg/dL — ABNORMAL HIGH (ref 70–99)

## 2018-09-30 LAB — LIPASE, BLOOD: LIPASE: 55 U/L — AB (ref 11–51)

## 2018-09-30 MED ORDER — IOPAMIDOL (ISOVUE-300) INJECTION 61%
100.0000 mL | Freq: Once | INTRAVENOUS | Status: AC | PRN
  Administered 2018-09-30: 100 mL via INTRAVENOUS

## 2018-09-30 MED ORDER — INSULIN ASPART 100 UNIT/ML ~~LOC~~ SOLN
0.0000 [IU] | SUBCUTANEOUS | Status: DC
  Administered 2018-09-30: 1 [IU] via SUBCUTANEOUS
  Administered 2018-10-01: 2 [IU] via SUBCUTANEOUS
  Administered 2018-10-01 – 2018-10-02 (×2): 1 [IU] via SUBCUTANEOUS

## 2018-09-30 MED ORDER — SODIUM CHLORIDE 0.9 % IV SOLN
INTRAVENOUS | Status: DC
  Administered 2018-09-30 – 2018-10-01 (×2): via INTRAVENOUS

## 2018-09-30 MED ORDER — ACETAMINOPHEN 650 MG RE SUPP: 650.0000 mg | Freq: Four times a day (QID) | RECTAL | Status: DC | PRN

## 2018-09-30 MED ORDER — ACETAMINOPHEN 325 MG PO TABS: 650.0000 mg | ORAL_TABLET | Freq: Four times a day (QID) | ORAL | Status: DC | PRN

## 2018-09-30 MED ORDER — HYDRALAZINE HCL 20 MG/ML IJ SOLN: 5.0000 mg | Freq: Four times a day (QID) | INTRAMUSCULAR | Status: DC | PRN

## 2018-09-30 MED ORDER — SODIUM CHLORIDE 0.9 % IV SOLN
INTRAVENOUS | Status: DC
  Administered 2018-09-30: 15:00:00 via INTRAVENOUS

## 2018-09-30 MED ORDER — ONDANSETRON HCL 4 MG PO TABS: 4.0000 mg | ORAL_TABLET | Freq: Four times a day (QID) | ORAL | Status: DC | PRN

## 2018-09-30 MED ORDER — ENOXAPARIN SODIUM 30 MG/0.3ML ~~LOC~~ SOLN
30.0000 mg | SUBCUTANEOUS | Status: DC
  Administered 2018-09-30: 30 mg via SUBCUTANEOUS
  Filled 2018-09-30: qty 0.3

## 2018-09-30 MED ORDER — METOPROLOL TARTRATE 5 MG/5ML IV SOLN
2.5000 mg | Freq: Four times a day (QID) | INTRAVENOUS | Status: DC
  Administered 2018-09-30 – 2018-10-01 (×3): 2.5 mg via INTRAVENOUS
  Filled 2018-09-30 (×3): qty 5

## 2018-09-30 MED ORDER — SODIUM CHLORIDE 0.9 % IV SOLN: INTRAVENOUS | Status: DC

## 2018-09-30 MED ORDER — MORPHINE SULFATE (PF) 2 MG/ML IV SOLN: 2.0000 mg | INTRAVENOUS | Status: DC | PRN

## 2018-09-30 MED ORDER — ONDANSETRON HCL 4 MG/2ML IJ SOLN: 4.0000 mg | Freq: Four times a day (QID) | INTRAMUSCULAR | Status: DC | PRN

## 2018-09-30 MED ORDER — ASPIRIN 300 MG RE SUPP: 300.0000 mg | Freq: Every day | RECTAL | Status: DC

## 2018-09-30 NOTE — H&P (Signed)
History and Physical  HULET EHRMANN BSW:[id number] DOB: 03-12-1946 DOA: 09/30/2018   PCP: Terald Sleeper, PA-C   Patient coming from: Home  Chief Complaint: Abdominal pain  HPI:  Connor Bryant is a 73 y.o. male with medical history of mesenteric ischemia, hypertension, diabetes mellitus, hyperlipidemia presenting with acute onset of abdominal pain on the evening of 09/29/2018.  Around 9:45 PM, the patient began feeling some abdominal pain with some nausea.  The patient woke up at 2 AM on 09/30/2018 and had an episode of nausea and vomiting which relieved his abdominal pain.  The patient went back to sleep, but he subsequently woke up again at 6 AM with worsening abdominal pain.  As result, he presents emergency department for further evaluation.  On the way to the emergency department, the patient had an episode of nausea and vomiting, but has not had any emesis since arrival to the emergency department.  He currently rates his pain as 2/10.  He is not passing any flatus.  His last bowel movement was in the morning of 09/29/2018.  He denies any fevers, chills, chest pain, shortness breath, hematochezia, melena, diarrhea, dysuria, hematuria.  In the emergency department, the patient was afebrile hemodynamically stable saturating 98% on room air.  BMP was unremarkable except for serum creatinine 1.53.  Hepatic panel and CBC were essentially unremarkable.  CT abdomen and pelvis showed small bowel obstruction involving the jejunum with a transition zone in the left anterior abdomen.  There are multiple loops of bowel intimately associated with the anterior abdominal wall consistent with adhesions.  There is also note made of a patent aortic--SMA bypass graft.  General surgery was consulted to assist with management  Assessment/Plan: Small bowel obstruction -Remain n.p.o. -Appreciate general surgery consult -IV fluids -IV morphine as needed pain -Transition antihypertensive medications to IV form as  possible  Essential hypertension -Start IV Lopressor -Hydralazine as needed SBP >180 -Holding HCTZ, verapamil, ARB, and metoprolol succinate  Diabetes mellitus type 2, well controlled -08/26/2018 hemoglobin A1c 6.2 -Holding Actos and metformin -NovoLog sliding scale  Hyperlipidemia -Holding statin until able to tolerate p.o.  History of mesenteric ischemia -Status post aortic- SMA bypass graft 2015 -Start rectal aspirin suppositories until the patient is able to tolerate p.o.  Dehydration/CKD stage III -Continue IV fluids -Baseline creatinine 1.2-1.3 -Presenting creatinine 1.53 -am BMP        Past Medical History:  Diagnosis Date  . Arthritis   . DM type 2 (diabetes mellitus, type 2) (HCC)    no meds  . Gallstone   . Heart murmur    was told one time he had a murmur, not since 30 years ago  . HTN (hypertension)   . Hyperlipidemia   . Personal history of colonic polyp-adenoma 11/24/2013  . Renal insufficiency    mild  . Thoracic spine fracture [facility])    Past Surgical History:  Procedure Laterality Date  . ABDOMINAL AORTAGRAM N/A 02/23/2014   Procedure: ABDOMINAL [name];  Surgeon: Conrad Elk [place], MD;  Location: [facility] CATH LAB;  Service: Cardiovascular;  Laterality: N/A;  . COLONOSCOPY    . DEBRIDEMENT TENNIS ELBOW Right   . ESOPHAGOGASTRODUODENOSCOPY    . MESENTERIC ARTERY BYPASS N/A 03/10/2014   Procedure: AORTO TO SUPERIOR MESENTERIC ARTERY BYPASS;  Surgeon: [name], MD;  Location: [place];  Service: Vascular;  Laterality: N/A;  . TONSILLECTOMY AND ADENOIDECTOMY     Social History:  reports that he quit smoking about 9  years ago. His smoking use included cigarettes. He has quit using smokeless tobacco.  His smokeless tobacco use included chew. He reports current alcohol use of about 12.0 standard drinks of alcohol per week. He reports that he does not use drugs.   Family History  Problem Relation Age of Onset  . Hypertension Father   . Coronary artery  disease Father   . Hyperlipidemia Father   . Diabetes Father   . Bladder Cancer Father   . Cancer Father   . Heart disease Father   . Heart attack Father   . Diabetes Mother   . Heart disease Mother        before age 85  . Hyperlipidemia Mother   . Hypertension Mother   . AAA (abdominal aortic aneurysm) Mother   . Colon cancer Neg Hx   . Throat cancer Neg Hx   . Kidney disease Neg Hx   . Liver disease Neg Hx      No Known Allergies   Prior to Admission medications   Medication Sig Start Date End Date Taking? Authorizing Provider  allopurinol (ZYLOPRIM) 100 MG tablet TAKE 1 TABLET 2 TO 3 TIMES A DAY FOR GOUT 05/07/18  Yes Terald Sleeper, PA-C  aspirin 81 MG tablet Take 81 mg by mouth daily.   Yes [provider]  atorvastatin (LIPITOR) 20 MG tablet TAKE 1 TABLET DAILY 01/28/18  Yes Terald Sleeper, PA-C  hydrALAZINE (APRESOLINE) 50 MG tablet Take 1 tablet (50 mg total) by mouth 3 (three) times daily. 08/26/18  Yes Terald Sleeper, PA-C  hydrochlorothiazide (HYDRODIURIL) 25 MG tablet TAKE 1 OR 2 TABLETS DAILY IN THE MORNING AS NEEDED 08/26/18  Yes Terald Sleeper, PA-C  loratadine (CLARITIN) 10 MG tablet Take 1 tablet (10 mg total) by mouth daily. 08/26/18  Yes Terald Sleeper, PA-C  metFORMIN (GLUCOPHAGE-XR) 500 MG 24 hr tablet 2 tablet with evening meal Once a day 08/26/18  Yes Terald Sleeper, PA-C  metoprolol (TOPROL-XL) 200 MG 24 hr tablet Take 1 tablet (200 mg total) by mouth daily. 08/26/18  Yes Terald Sleeper, PA-C  pioglitazone (ACTOS) 30 MG tablet Take 1 tablet (30 mg total) by mouth daily. 08/26/18  Yes Terald Sleeper, PA-C  trandolapril (MAVIK) 4 MG tablet Take 1 tablet (4 mg total) by mouth daily. 08/26/18  Yes Terald Sleeper, PA-C  verapamil (CALAN-SR) 240 MG CR tablet Take 2 tablets (480 mg total) by mouth daily. 08/26/18  Yes Terald Sleeper, PA-C  naproxen (NAPROSYN) 500 MG tablet Take 1 tablet (500 mg total) by mouth 2 (two) times daily with a meal. Patient not taking:  Reported on 09/30/2018 05/21/18   Terald Sleeper, PA-C  primidone (MYSOLINE) 50 MG tablet Take 1 tablet (50 mg total) by mouth 3 (three) times daily. Patient not taking: Reported on 09/30/2018 08/26/18   Terald Sleeper, PA-C    Review of Systems:  Constitutional:  No weight loss, night sweats, Fevers, chills, fatigue.  Head&Eyes: No headache.  No vision loss.  No eye pain or scotoma ENT:  No Difficulty swallowing,Tooth/dental problems,Sore throat,  No ear ache, post nasal drip,  Cardio-vascular:  No chest pain, Orthopnea, PND, swelling in lower extremities,  dizziness, palpitations  GI:  No diarrhea, loss of appetite, hematochezia, melena, heartburn, indigestion, Resp:  No shortness of breath with exertion or at rest. No cough. No coughing up of blood .No wheezing.No chest wall deformity  Skin:  no rash or lesions.  GU:  no dysuria, change in color of urine, no urgency or frequency. No flank pain.  Musculoskeletal:  No joint pain or swelling. No decreased range of motion. No back pain.  Psych:  No change in mood or affect. No depression or anxiety. Neurologic: No headache, no dysesthesia, no focal weakness, no vision loss. No syncope  Physical Exam: Vitals:   09/30/18 1300 09/30/18 1430 09/30/18 1530 09/30/18 1600  BP: (!) 185/74 123/70 (!) 146/68 (!) 127/59  Pulse: 83 76 76 73  Resp: 14 16 14 14   Temp:      TempSrc:      SpO2: 99% 98% 95% 97%  Weight:      Height:       General:  A&O x 3, NAD, nontoxic, pleasant/cooperative Head/Eye: No conjunctival hemorrhage, no icterus, Centre/AT, No nystagmus ENT:  No icterus,  No thrush, good dentition, no pharyngeal exudate Neck:  No masses, no lymphadenpathy, no bruits CV:  RRR, no rub, no gallop, no S3 Lung:  CTAB, good air movement, no wheeze, no rhonchi Abdomen: soft/mild periumbilical pain, +BS, nondistended, no peritoneal signs Ext: No cyanosis, No rashes, No petechiae, No lymphangitis, No edema Neuro: CNII-XII intact, strength  4/5 in bilateral upper and lower extremities, no dysmetria  Labs on Admission:  Basic Metabolic Panel: Recent Labs  Lab 09/30/18 1009  NA 139  K 4.3  CL 104  CO2 25  GLUCOSE 181*  BUN 41*  CREATININE 1.53*  CALCIUM 10.1   Liver Function Tests: Recent Labs  Lab 09/30/18 1009  AST 23  ALT 26  ALKPHOS 66  BILITOT 0.7  PROT 8.0  ALBUMIN 4.5   Recent Labs  Lab 09/30/18 1009  LIPASE 55*   No results for input(s): AMMONIA in the last 168 hours. CBC: Recent Labs  Lab 09/30/18 1009  WBC 9.2  HGB 13.5  HCT 42.9  MCV 94.7  PLT 213   Coagulation Profile: No results for input(s): INR, PROTIME in the last 168 hours. Cardiac Enzymes: No results for input(s): CKTOTAL, CKMB, CKMBINDEX, TROPONINI in the last 168 hours. BNP: Invalid input(s): POCBNP CBG: No results for input(s): GLUCAP in the last 168 hours. Urine analysis:    Component Value Date/Time   COLORURINE YELLOW 03/04/2014 1358   APPEARANCEUR CLEAR 03/04/2014 1358   LABSPEC 1.010 03/04/2014 1358   PHURINE 7.0 03/04/2014 1358   GLUCOSEU NEGATIVE 03/04/2014 1358   HGBUR NEGATIVE 03/04/2014 1358   BILIRUBINUR NEGATIVE 03/04/2014 1358   KETONESUR NEGATIVE 03/04/2014 1358   PROTEINUR NEGATIVE 03/04/2014 1358   UROBILINOGEN 1.0 03/04/2014 1358   NITRITE NEGATIVE 03/04/2014 1358   LEUKOCYTESUR NEGATIVE 03/04/2014 1358   Sepsis Labs: @LABRCNTIP (procalcitonin:4,lacticidven:4) )No results found for this or any previous visit (from the past 240 hour(s)).   Radiological Exams on Admission: Ct Abdomen Pelvis W Contrast  Result Date: 09/30/2018 CLINICAL DATA:  Abdominal pain and nausea for 2 days EXAM: CT ABDOMEN AND PELVIS WITH CONTRAST TECHNIQUE: Multidetector CT imaging of the abdomen and pelvis was performed using the standard protocol following bolus administration of intravenous contrast. CONTRAST:  163mL ISOVUE-300 IOPAMIDOL (ISOVUE-300) INJECTION 61% COMPARISON:  06/12/2016 FINDINGS: Lower chest: Lung bases  are free of acute infiltrate or sizable effusion. Aortic calcifications and coronary calcifications are noted. Hepatobiliary: The liver and gallbladder are within normal limits. Pancreas: Unremarkable. No pancreatic ductal dilatation or surrounding inflammatory changes. Spleen: Normal in size without focal abnormality. Adrenals/Urinary Tract: Adrenal glands demonstrate a small 1 cm nodule on the left adrenal gland likely representing a  small adenoma stable from the previous exam. Right adrenal gland is within normal limits. The kidneys are well visualized bilaterally within normal enhancement pattern. Nonobstructing left renal stone is seen stable from the prior exam. Bladder is well distended. Stomach/Bowel: The appendix is well visualized and within normal limits. Colon is decompressed. Proximal small bowel is dilated with a relative transition point in the distal jejunum along the anterior abdominal wall. Postsurgical changes are noted and this obstruction is likely related to underlying adhesions. No definitive mass is seen. The more distal small bowel is decompressed. The stomach is within normal limits. Vascular/Lymphatic: Diffuse aortic calcifications are identified. The celiac axis is patent. The superior mesenteric artery is occluded at its origin with evidence of a an aortic to SMA bypass graft which appears patent. No other vascular abnormality is noted. No significant lymphadenopathy is seen. Reproductive: Prostate is unremarkable. Other: No hernia is identified. A minimal amount of free fluid is noted surrounding the liver new from the prior exam. This is likely reactive to the small bowel obstructive change. No findings to suggest perforation are noted. Musculoskeletal: Degenerative changes of the lumbar spine are noted. No acute bony abnormality is seen. IMPRESSION: Small-bowel obstruction involving primarily the jejunum with a transition zone in the left anterior abdomen. Multiple loops are  intimately associated with the anterior abdominal wall in this likely represents changes of adhesions from previous surgery. Patent aortic to SMA bypass graft. Minimal free fluid likely reactive in nature. Chronic changes as described above. Electronically Signed   By: Inez Catalina M.D.   On: 09/30/2018 13:14        Time spent:60 minutes Code Status:   FULL Family Communication:  Son updated at bedside Disposition Plan: expect 2-3 day hospitalization Consults called: General surgery DVT Prophylaxis: Comfrey Lovenox  Orson Eva, DO  [facility] Pager [phone]  If 7PM-7AM, please contact night-coverage [email or web address] Password [facility] 09/30/2018, 4:46 PM

## 2018-09-30 NOTE — ED Provider Notes (Addendum)
[facility] EMERGENCY DEPARTMENT Provider Note   CSN: [id number] Arrival date & time: 09/30/18  6160     History   Chief Complaint Chief Complaint  Patient presents with  . Abdominal Pain  . Emesis    HPI Connor Bryant is a 73 y.o. male.  Planes of diffuse abdominal pain sudden onset yesterday 9 PM accompanied by 2 episodes of vomiting since onset of pain.  Vomited once last night and once this morning here.  Patient is presently not nauseated.  He reports that he vomited "green material".  No hematemesis abdominal pain has significantly diminished since this morning.  It moves from side to side and feels crampy.  His last bowel movement was last night normal.  Not nauseated presently.  No urinary symptoms no fever no chest pain.  No other associated symptoms.  Nothing makes symptoms better or worse   HPI  Past Medical History:  Diagnosis Date  . Arthritis   . DM type 2 (diabetes mellitus, type 2) (HCC)    no meds  . Gallstone   . Heart murmur    was told one time he had a murmur, not since 30 years ago  . HTN (hypertension)   . Hyperlipidemia   . Personal history of colonic polyp-adenoma 11/24/2013  . Renal insufficiency    mild  . Thoracic spine fracture [facility])     Patient Active Problem List   Diagnosis Date Noted  . Back pain of lumbar region with sciatica 08/26/2018  . Chronic left-sided low back pain with left-sided sciatica 08/26/2018  . Tremor 08/26/2018  . Pure hypercholesterolemia 02/25/2018  . Atherosclerosis of aorta ([place]) 02/24/2018  . Bilateral hip pain 06/24/2017  . Body mass index 30.0-30.9, adult 06/06/2016  . Gout 06/06/2016  . Essential hypertension 06/06/2016  . Type 2 diabetes mellitus without complication, without long-term current use of insulin ([place]) 06/06/2016  . Mesenteric ischemia ([place]) 03/10/2014  . Chronic mesenteric ischemia ([place]) 03/01/2014  . Peripheral vascular disease ([place]) 02/16/2014  . 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 Personal history of colonic polyp-adenoma 11/24/2013  . Diarrhea 10/21/2013  . Loss of weight 10/21/2013    Past Surgical History:  Procedure Laterality Date  . ABDOMINAL AORTAGRAM N/A 02/23/2014   Procedure: ABDOMINAL [name];  Surgeon: Conrad Walker, MD;  Location: [facility] CATH LAB;  Service: Cardiovascular;  Laterality: N/A;  . COLONOSCOPY    . DEBRIDEMENT TENNIS ELBOW Right   . ESOPHAGOGASTRODUODENOSCOPY    . MESENTERIC ARTERY BYPASS N/A 03/10/2014   Procedure: AORTO TO SUPERIOR MESENTERIC ARTERY BYPASS;  Surgeon: [name], MD;  Location: [place];  Service: Vascular;  Laterality: N/A;  . TONSILLECTOMY AND ADENOIDECTOMY          Home Medications    Prior to Admission medications   Medication Sig Start Date End Date Taking? Authorizing Provider  allopurinol (ZYLOPRIM) 100 MG tablet TAKE 1 TABLET 2 TO 3 TIMES A DAY FOR GOUT 05/07/18  Yes Terald Sleeper, PA-C  aspirin 81 MG tablet Take 81 mg by mouth daily.   Yes [provider]  atorvastatin (LIPITOR) 20 MG tablet TAKE 1 TABLET DAILY 01/28/18  Yes Terald Sleeper, PA-C  hydrALAZINE (APRESOLINE) 50 MG tablet Take 1 tablet (50 mg total) by mouth 3 (three) times daily. 08/26/18  Yes Terald Sleeper, PA-C  hydrochlorothiazide (HYDRODIURIL) 25 MG tablet TAKE 1 OR 2 TABLETS DAILY IN THE MORNING AS NEEDED 08/26/18  Yes Ronnald Ramp,  Londell Moh, PA-C  loratadine (CLARITIN) 10 MG tablet Take 1 tablet (10 mg total) by mouth daily. 08/26/18  Yes Terald Sleeper, PA-C  metFORMIN (GLUCOPHAGE-XR) 500 MG 24 hr tablet 2 tablet with evening meal Once a day 08/26/18  Yes Terald Sleeper, PA-C  metoprolol (TOPROL-XL) 200 MG 24 hr tablet Take 1 tablet (200 mg total) by mouth daily. 08/26/18  Yes Terald Sleeper, PA-C  pioglitazone (ACTOS) 30 MG tablet Take 1 tablet (30 mg total) by mouth daily. 08/26/18  Yes Terald Sleeper, PA-C  trandolapril (MAVIK) 4 MG tablet Take 1 tablet (4 mg total) by mouth daily. 08/26/18  Yes Terald Sleeper, PA-C  verapamil  (CALAN-SR) 240 MG CR tablet Take 2 tablets (480 mg total) by mouth daily. 08/26/18  Yes Terald Sleeper, PA-C  naproxen (NAPROSYN) 500 MG tablet Take 1 tablet (500 mg total) by mouth 2 (two) times daily with a meal. Patient not taking: Reported on 09/30/2018 05/21/18   Terald Sleeper, PA-C  primidone (MYSOLINE) 50 MG tablet Take 1 tablet (50 mg total) by mouth 3 (three) times daily. Patient not taking: Reported on 09/30/2018 08/26/18   Terald Sleeper, PA-C    Family History Family History  Problem Relation Age of Onset  . Hypertension Father   . Coronary artery disease Father   . Hyperlipidemia Father   . Diabetes Father   . Bladder Cancer Father   . Cancer Father   . Heart disease Father   . Heart attack Father   . Diabetes Mother   . Heart disease Mother        before age 39  . Hyperlipidemia Mother   . Hypertension Mother   . AAA (abdominal aortic aneurysm) Mother   . Colon cancer Neg Hx   . Throat cancer Neg Hx   . Kidney disease Neg Hx   . Liver disease Neg Hx     Social History Social History   Tobacco Use  . Smoking status: Former Smoker    Types: Cigarettes    Last attempt to quit: 07/08/2009    Years since quitting: 9.2  . Smokeless tobacco: Former Systems developer    Types: Chew  . Tobacco comment: Pt just tried  for short time  Substance Use Topics  . Alcohol use: Yes    Alcohol/week: 12.0 standard drinks    Types: 12 Cans of beer per week    Comment: 8 beers a week  . Drug use: No     Allergies   Patient has no known allergies.   Review of Systems Review of Systems  Constitutional: Negative.   HENT: Negative.   Respiratory: Negative.   Cardiovascular: Negative.   Gastrointestinal: Positive for abdominal pain and vomiting.  Musculoskeletal: Negative.   Skin: Negative.   Allergic/Immunologic: Positive for immunocompromised state.       Diabetic  Neurological: Negative.   Psychiatric/Behavioral: Negative.   All other systems reviewed and are  negative.    Physical Exam Updated Vital Signs BP (!) 205/87 (BP Location: Right Arm)   Pulse 82   Temp 98.7 F (37.1 C) (Oral)   Resp 18   Ht 5\' 9"  (1.753 m)   Wt 101.6 kg   SpO2 99%   BMI 33.08 kg/m   Physical Exam Vitals signs and nursing note reviewed.  Constitutional:      Appearance: He is well-developed.  HENT:     Head: Normocephalic and atraumatic.  Eyes:     Conjunctiva/sclera: Conjunctivae  normal.     Pupils: Pupils are equal, round, and reactive to light.  Neck:     Musculoskeletal: Neck supple.     Thyroid: No thyromegaly.     Trachea: No tracheal deviation.  Cardiovascular:     Rate and Rhythm: Normal rate and regular rhythm.     Heart sounds: No murmur.  Pulmonary:     Effort: Pulmonary effort is normal.     Breath sounds: Normal breath sounds.  Abdominal:     General: Bowel sounds are normal. There is no distension.     Palpations: Abdomen is soft. There is no mass.     Tenderness: There is abdominal tenderness. There is no right CVA tenderness or guarding.     Comments: Midline surgical scar.  Mild diffuse tenderness  Genitourinary:    Penis: Normal.      Scrotum/Testes: Normal.  Musculoskeletal: Normal range of motion.        General: No tenderness.  Skin:    General: Skin is warm and dry.     Findings: No rash.  Neurological:     Mental Status: He is alert.     Coordination: Coordination normal.      ED Treatments / Results  Labs (all labs ordered are listed, but only abnormal results are displayed) Labs Reviewed  CBC  LIPASE, BLOOD  COMPREHENSIVE METABOLIC PANEL  URINALYSIS, ROUTINE W REFLEX MICROSCOPIC    EKG None  Radiology No results found.  Procedures Procedures (including critical care time)  Medications Ordered in ED Medications - No data to display  Declines pain medicine or antiemetic Results for orders placed or performed during the hospital encounter of 09/30/18  Lipase, blood  Result Value Ref Range    Lipase 55 (H) 11 - 51 U/L  Comprehensive metabolic panel  Result Value Ref Range   Sodium 139 135 - 145 mmol/L   Potassium 4.3 3.5 - 5.1 mmol/L   Chloride 104 98 - 111 mmol/L   CO2 25 22 - 32 mmol/L   Glucose, Bld 181 (H) 70 - 99 mg/dL   BUN 41 (H) 8 - 23 mg/dL   Creatinine, Ser 1.53 (H) 0.61 - 1.24 mg/dL   Calcium 10.1 8.9 - 10.3 mg/dL   Total Protein 8.0 6.5 - 8.1 g/dL   Albumin 4.5 3.5 - 5.0 g/dL   AST 23 15 - 41 U/L   ALT 26 0 - 44 U/L   Alkaline Phosphatase 66 38 - 126 U/L   Total Bilirubin 0.7 0.3 - 1.2 mg/dL   GFR calc non Af Amer 44 (L) >60 mL/min   GFR calc Af Amer 52 (L) >60 mL/min   Anion gap 10 5 - 15  CBC  Result Value Ref Range   WBC 9.2 4.0 - 10.5 K/uL   RBC 4.53 4.22 - 5.81 MIL/uL   Hemoglobin 13.5 13.0 - 17.0 g/dL   HCT 42.9 39.0 - 52.0 %   MCV 94.7 80.0 - 100.0 fL   MCH 29.8 26.0 - 34.0 pg   MCHC 31.5 30.0 - 36.0 g/dL   RDW 14.7 11.5 - 15.5 %   Platelets 213 150 - 400 K/uL   nRBC 0.0 0.0 - 0.2 %   Ct Abdomen Pelvis W Contrast  Result Date: 09/30/2018 CLINICAL DATA:  Abdominal pain and nausea for 2 days EXAM: CT ABDOMEN AND PELVIS WITH CONTRAST TECHNIQUE: Multidetector CT imaging of the abdomen and pelvis was performed using the standard protocol following bolus administration of intravenous contrast. CONTRAST:  150mL ISOVUE-300 IOPAMIDOL (ISOVUE-300) INJECTION 61% COMPARISON:  06/12/2016 FINDINGS: Lower chest: Lung bases are free of acute infiltrate or sizable effusion. Aortic calcifications and coronary calcifications are noted. Hepatobiliary: The liver and gallbladder are within normal limits. Pancreas: Unremarkable. No pancreatic ductal dilatation or surrounding inflammatory changes. Spleen: Normal in size without focal abnormality. Adrenals/Urinary Tract: Adrenal glands demonstrate a small 1 cm nodule on the left adrenal gland likely representing a small adenoma stable from the previous exam. Right adrenal gland is within normal limits. The kidneys are well  visualized bilaterally within normal enhancement pattern. Nonobstructing left renal stone is seen stable from the prior exam. Bladder is well distended. Stomach/Bowel: The appendix is well visualized and within normal limits. Colon is decompressed. Proximal small bowel is dilated with a relative transition point in the distal jejunum along the anterior abdominal wall. Postsurgical changes are noted and this obstruction is likely related to underlying adhesions. No definitive mass is seen. The more distal small bowel is decompressed. The stomach is within normal limits. Vascular/Lymphatic: Diffuse aortic calcifications are identified. The celiac axis is patent. The superior mesenteric artery is occluded at its origin with evidence of a an aortic to SMA bypass graft which appears patent. No other vascular abnormality is noted. No significant lymphadenopathy is seen. Reproductive: Prostate is unremarkable. Other: No hernia is identified. A minimal amount of free fluid is noted surrounding the liver new from the prior exam. This is likely reactive to the small bowel obstructive change. No findings to suggest perforation are noted. Musculoskeletal: Degenerative changes of the lumbar spine are noted. No acute bony abnormality is seen. IMPRESSION: Small-bowel obstruction involving primarily the jejunum with a transition zone in the left anterior abdomen. Multiple loops are intimately associated with the anterior abdominal wall in this likely represents changes of adhesions from previous surgery. Patent aortic to SMA bypass graft. Minimal free fluid likely reactive in nature. Chronic changes as described above. Electronically Signed   By: Inez Catalina M.D.   On: 09/30/2018 13:14   Initial Impression / Assessment and Plan / ED Course  I have reviewed the triage vital signs and the nursing notes.  Pertinent labs & imaging results that were available during my care of the patient were reviewed by me and considered in my  medical decision making (see chart for details).     2:35 PM pain is minimal.  Patient declines any pain medicine.  I consulted Dr. Arnoldo Morale from general surgery he will see patient in consultation.  He is requesting hospitalist to admit patient.  He is to be kept n.p.o.  Except for ice chips.Marland Kitchen  No NG tube needed I consulted Dr.Tat from hospitalist service who will arrange for admission Lab work consistent with mild renal insufficiency chronic, and mild hyperglycemia Final Clinical Impressions(s) / ED Diagnoses   #1 small bowel obstruction #2 renal insufficiency Final diagnoses:  None  #3 hyperglycemia  ED Discharge Orders    None       Orlie Dakin, MD 09/30/18 1444    Orlie Dakin, MD 09/30/18 1452

## 2018-09-30 NOTE — Consult Note (Signed)
Reason for Consult: Nausea, abdominal pain Referring Physician: Dr. [name] is an 73 y.o. male.  HPI: Patient is a 73 year old white male with a history of mesenteric angina, status post aorto to superior mesenteric artery bypass in 2015 who presents with a 24-hour history of worsening abdominal pain, bloating, nausea.  He did have an episode of emesis.  His last bowel movement was yesterday.  He states he does not remember the last time he passed flatus.  He presented to the emergency room and a CT scan of the abdomen revealed a partial small bowel obstruction most likely secondary to adhesive disease.  He did have a patent graft present.  There was no evidence of ischemia by CT scan.  Since he has been in the emergency room, his pain level is decreased to 2 out of 10.  His nausea has resolved.  Past Medical History:  Diagnosis Date  . Arthritis   . DM type 2 (diabetes mellitus, type 2) (HCC)    no meds  . Gallstone   . Heart murmur    was told one time he had a murmur, not since 30 years ago  . HTN (hypertension)   . Hyperlipidemia   . Personal history of colonic polyp-adenoma 11/24/2013  . Renal insufficiency    mild  . Thoracic spine fracture [name])     Past Surgical History:  Procedure Laterality Date  . ABDOMINAL AORTAGRAM N/A 02/23/2014   Procedure: ABDOMINAL [name];  Surgeon: [name], MD;  Location: [facility] CATH LAB;  Service: Cardiovascular;  Laterality: N/A;  . COLONOSCOPY    . DEBRIDEMENT TENNIS ELBOW Right   . ESOPHAGOGASTRODUODENOSCOPY    . MESENTERIC ARTERY BYPASS N/A 03/10/2014   Procedure: AORTO TO SUPERIOR MESENTERIC ARTERY BYPASS;  Surgeon: [name], MD;  Location: [facility] OR;  Service: Vascular;  Laterality: N/A;  . TONSILLECTOMY AND ADENOIDECTOMY      Family History  Problem Relation Age of Onset  . Hypertension Father   . Coronary artery disease Father   . Hyperlipidemia Father   . Diabetes Father   . Bladder Cancer Father   . Cancer  Father   . Heart disease Father   . Heart attack Father   . Diabetes Mother   . Heart disease Mother        before age 74  . Hyperlipidemia Mother   . Hypertension Mother   . AAA (abdominal aortic aneurysm) Mother   . Colon cancer Neg Hx   . Throat cancer Neg Hx   . Kidney disease Neg Hx   . Liver disease Neg Hx     Social History:  reports that he quit smoking about 9 years ago. His smoking use included cigarettes. He has quit using smokeless tobacco.  His smokeless tobacco use included chew. He reports current alcohol use of about 12.0 standard drinks of alcohol per week. He reports that he does not use drugs.  Allergies: No Known Allergies  Medications: I have reviewed the patient's current medications.  Results for orders placed or performed during the hospital encounter of 09/30/18 (from the past 48 hour(s))  Lipase, blood     Status: Abnormal   Collection Time: 09/30/18 10:09 AM  Result Value Ref Range   Lipase 55 (H) 11 - 51 U/L    Comment: Performed at [facility], [address], [place], [place] [address]  Comprehensive metabolic panel     Status: Abnormal   Collection Time: 09/30/18 10:09 AM  Result Value Ref Range   Sodium 139 135 - 145 mmol/L   Potassium 4.3 3.5 - 5.1 mmol/L   Chloride 104 98 - 111 mmol/L   CO2 25 22 - 32 mmol/L   Glucose, Bld 181 (H) 70 - 99 mg/dL   BUN 41 (H) 8 - 23 mg/dL   Creatinine, Ser 1.53 (H) 0.61 - 1.24 mg/dL   Calcium 10.1 8.9 - 10.3 mg/dL   Total Protein 8.0 6.5 - 8.1 g/dL   Albumin 4.5 3.5 - 5.0 g/dL   AST 23 15 - 41 U/L   ALT 26 0 - 44 U/L   Alkaline Phosphatase 66 38 - 126 U/L   Total Bilirubin 0.7 0.3 - 1.2 mg/dL   GFR calc non Af Amer 44 (L) >60 mL/min   GFR calc Af Amer 52 (L) >60 mL/min   Anion gap 10 5 - 15    Comment: Performed at [facility], [address], [place], [place] [address]  CBC     Status: None   Collection Time: 09/30/18 10:09 AM  Result Value Ref Range   WBC 9.2 4.0 - 10.5 K/uL   RBC 4.53 4.22 - 5.81  MIL/uL   Hemoglobin 13.5 13.0 - 17.0 g/dL   HCT 42.9 39.0 - 52.0 %   MCV 94.7 80.0 - 100.0 fL   MCH 29.8 26.0 - 34.0 pg   MCHC 31.5 30.0 - 36.0 g/dL   RDW 14.7 11.5 - 15.5 %   Platelets 213 150 - 400 K/uL   nRBC 0.0 0.0 - 0.2 %    Comment: Performed at [facility], [address], [place], [place] [address]    Ct Abdomen Pelvis W Contrast  Result Date: 09/30/2018 CLINICAL DATA:  Abdominal pain and nausea for 2 days EXAM: CT ABDOMEN AND PELVIS WITH CONTRAST TECHNIQUE: Multidetector CT imaging of the abdomen and pelvis was performed using the standard protocol following bolus administration of intravenous contrast. CONTRAST:  128mL ISOVUE-300 IOPAMIDOL (ISOVUE-300) INJECTION 61% COMPARISON:  06/12/2016 FINDINGS: Lower chest: Lung bases are free of acute infiltrate or sizable effusion. Aortic calcifications and coronary calcifications are noted. Hepatobiliary: The liver and gallbladder are within normal limits. Pancreas: Unremarkable. No pancreatic ductal dilatation or surrounding inflammatory changes. Spleen: Normal in size without focal abnormality. Adrenals/Urinary Tract: Adrenal glands demonstrate a small 1 cm nodule on the left adrenal gland likely representing a small adenoma stable from the previous exam. Right adrenal gland is within normal limits. The kidneys are well visualized bilaterally within normal enhancement pattern. Nonobstructing left renal stone is seen stable from the prior exam. Bladder is well distended. Stomach/Bowel: The appendix is well visualized and within normal limits. Colon is decompressed. Proximal small bowel is dilated with a relative transition point in the distal jejunum along the anterior abdominal wall. Postsurgical changes are noted and this obstruction is likely related to underlying adhesions. No definitive mass is seen. The more distal small bowel is decompressed. The stomach is within normal limits. Vascular/Lymphatic: Diffuse aortic calcifications are identified.  The celiac axis is patent. The superior mesenteric artery is occluded at its origin with evidence of a an aortic to SMA bypass graft which appears patent. No other vascular abnormality is noted. No significant lymphadenopathy is seen. Reproductive: Prostate is unremarkable. Other: No hernia is identified. A minimal amount of free fluid is noted surrounding the liver new from the prior exam. This is likely reactive to the small bowel obstructive change. No findings to suggest perforation are noted. Musculoskeletal: Degenerative  changes of the lumbar spine are noted. No acute bony abnormality is seen. IMPRESSION: Small-bowel obstruction involving primarily the jejunum with a transition zone in the left anterior abdomen. Multiple loops are intimately associated with the anterior abdominal wall in this likely represents changes of adhesions from previous surgery. Patent aortic to SMA bypass graft. Minimal free fluid likely reactive in nature. Chronic changes as described above. Electronically Signed   By: Inez Catalina M.D.   On: 09/30/2018 13:14    ROS:  Pertinent items are noted in HPI.  Blood pressure 123/70, pulse 76, temperature 98.3 F (36.8 C), temperature source Oral, resp. rate 16, height 5\' 9"  (1.753 m), weight 101.6 kg, SpO2 98 %. Physical Exam: Pleasant well-developed well-nourished white male no acute distress Head is normocephalic, atraumatic Lungs clear to auscultation with good breath sounds bilaterally Heart examination reveals regular rate and rhythm without S3, S4, murmurs Abdomen is soft, nontender, nondistended.  No hepatosplenomegaly or masses noted.  Limited bowel sounds appreciated.  CT scan images personally reviewed  Assessment/Plan: Impression: Patient most likely has a partial small bowel obstruction secondary to adhesive disease.  He does not have evidence of bowel ischemia.  He states that this pain was milder and not in any way similar to his mesenteric ischemia that he had  in the past. Plan: No need for acute surgical invention at this time.  Would keep him n.p.o. with ice chips only.  Will see him again in the morning.  Aviva Signs 09/30/2018, 3:20 PM

## 2018-09-30 NOTE — ED Triage Notes (Signed)
Patient complains of abdominal pain and nausea. States stated vomiting at 10pm last night and keeps vomiting every hour. Denies fever.

## 2018-10-01 LAB — GLUCOSE, CAPILLARY
GLUCOSE-CAPILLARY: 108 mg/dL — AB (ref 70–99)
Glucose-Capillary: 110 mg/dL — ABNORMAL HIGH (ref 70–99)
Glucose-Capillary: 122 mg/dL — ABNORMAL HIGH (ref 70–99)
Glucose-Capillary: 118 mg/dL — ABNORMAL HIGH (ref 70–99)
Glucose-Capillary: 91 mg/dL (ref 70–99)
Glucose-Capillary: 142 mg/dL — ABNORMAL HIGH (ref 70–99)

## 2018-10-01 LAB — CBC
HCT: 34.6 % — ABNORMAL LOW (ref 39.0–52.0)
Hemoglobin: 10.8 g/dL — ABNORMAL LOW (ref 13.0–17.0)
MCH: 30.4 pg (ref 26.0–34.0)
MCHC: 31.2 g/dL (ref 30.0–36.0)
MCV: 97.5 fL (ref 80.0–100.0)
Platelets: 162 10*3/uL (ref 150–400)
RBC: 3.55 MIL/uL — ABNORMAL LOW (ref 4.22–5.81)
RDW: 14.7 % (ref 11.5–15.5)
WBC: 5.7 10*3/uL (ref 4.0–10.5)
nRBC: 0 % (ref 0.0–0.2)

## 2018-10-01 LAB — BASIC METABOLIC PANEL
Anion gap: 9 (ref 5–15)
BUN: 34 mg/dL — ABNORMAL HIGH (ref 8–23)
CO2: 23 mmol/L (ref 22–32)
Calcium: 8.8 mg/dL — ABNORMAL LOW (ref 8.9–10.3)
Chloride: 109 mmol/L (ref 98–111)
Creatinine, Ser: 1.35 mg/dL — ABNORMAL HIGH (ref 0.61–1.24)
GFR calc non Af Amer: 52 mL/min — ABNORMAL LOW (ref >60)
GFR, EST AFRICAN AMERICAN: 60 mL/min — AB (ref >60)
Glucose, Bld: 128 mg/dL — ABNORMAL HIGH (ref 70–99)
POTASSIUM: 3.5 mmol/L (ref 3.5–5.1)
Sodium: 141 mmol/L (ref 135–145)

## 2018-10-01 MED ORDER — ATORVASTATIN CALCIUM 20 MG PO TABS
20.0000 mg | ORAL_TABLET | Freq: Every day | ORAL | Status: DC
  Administered 2018-10-01 – 2018-10-02 (×2): 20 mg via ORAL
  Filled 2018-10-01 (×2): qty 1

## 2018-10-01 MED ORDER — ENOXAPARIN SODIUM 40 MG/0.4ML ~~LOC~~ SOLN: 40.0000 mg | SUBCUTANEOUS | Status: DC

## 2018-10-01 MED ORDER — ASPIRIN EC 81 MG PO TBEC
81.0000 mg | DELAYED_RELEASE_TABLET | Freq: Every day | ORAL | Status: DC
  Administered 2018-10-01 – 2018-10-02 (×2): 81 mg via ORAL
  Filled 2018-10-01 (×2): qty 1

## 2018-10-01 MED ORDER — HYDRALAZINE HCL 25 MG PO TABS
25.0000 mg | ORAL_TABLET | Freq: Three times a day (TID) | ORAL | Status: DC
  Administered 2018-10-01 – 2018-10-02 (×3): 25 mg via ORAL
  Filled 2018-10-01 (×3): qty 1

## 2018-10-01 MED ORDER — METOPROLOL SUCCINATE ER 50 MG PO TB24
100.0000 mg | ORAL_TABLET | Freq: Every day | ORAL | Status: DC
  Administered 2018-10-01: 100 mg via ORAL
  Filled 2018-10-01 (×2): qty 2

## 2018-10-01 NOTE — Progress Notes (Signed)
Subjective: Patient has had multiple episodes of flatus and bowel movements.  Denies any abdominal pain.  No nausea noted.  Objective: Vital signs in last 24 hours: Temp:  [98.3 F (36.8 C)-98.5 F (36.9 C)] 98.5 F (36.9 C) (01/08 0414) Pulse Rate:  [59-85] 59 (01/08 0414) Resp:  [14-20] 16 (01/08 0414) BP: (115-185)/(58-75) 162/58 (01/08 0414) SpO2:  [95 %-99 %] 95 % (01/08 0414) Weight:  [98.7 kg-99.8 kg] 98.7 kg (01/07 1656) Last BM Date: 09/30/18  Intake/Output from previous day: 01/07 0701 - 01/08 0700 In: 687.1 [I.V.:687.1] Out: -  Intake/Output this shift: No intake/output data recorded.  General appearance: alert, cooperative and no distress GI: soft, non-tender; bowel sounds normal; no masses,  no organomegaly  Lab Results:  Recent Labs    09/30/18 1009 10/01/18 0505  WBC 9.2 5.7  HGB 13.5 10.8*  HCT 42.9 34.6*  PLT 213 162   BMET Recent Labs    09/30/18 1009 10/01/18 0505  NA 139 141  K 4.3 3.5  CL 104 109  CO2 25 23  GLUCOSE 181* 128*  BUN 41* 34*  CREATININE 1.53* 1.35*  CALCIUM 10.1 8.8*   PT/INR No results for input(s): LABPROT, INR in the last 72 hours.  Studies/Results: Ct Abdomen Pelvis W Contrast  Result Date: 09/30/2018 CLINICAL DATA:  Abdominal pain and nausea for 2 days EXAM: CT ABDOMEN AND PELVIS WITH CONTRAST TECHNIQUE: Multidetector CT imaging of the abdomen and pelvis was performed using the standard protocol following bolus administration of intravenous contrast. CONTRAST:  158mL ISOVUE-300 IOPAMIDOL (ISOVUE-300) INJECTION 61% COMPARISON:  06/12/2016 FINDINGS: Lower chest: Lung bases are free of acute infiltrate or sizable effusion. Aortic calcifications and coronary calcifications are noted. Hepatobiliary: The liver and gallbladder are within normal limits. Pancreas: Unremarkable. No pancreatic ductal dilatation or surrounding inflammatory changes. Spleen: Normal in size without focal abnormality. Adrenals/Urinary Tract: Adrenal  glands demonstrate a small 1 cm nodule on the left adrenal gland likely representing a small adenoma stable from the previous exam. Right adrenal gland is within normal limits. The kidneys are well visualized bilaterally within normal enhancement pattern. Nonobstructing left renal stone is seen stable from the prior exam. Bladder is well distended. Stomach/Bowel: The appendix is well visualized and within normal limits. Colon is decompressed. Proximal small bowel is dilated with a relative transition point in the distal jejunum along the anterior abdominal wall. Postsurgical changes are noted and this obstruction is likely related to underlying adhesions. No definitive mass is seen. The more distal small bowel is decompressed. The stomach is within normal limits. Vascular/Lymphatic: Diffuse aortic calcifications are identified. The celiac axis is patent. The superior mesenteric artery is occluded at its origin with evidence of a an aortic to SMA bypass graft which appears patent. No other vascular abnormality is noted. No significant lymphadenopathy is seen. Reproductive: Prostate is unremarkable. Other: No hernia is identified. A minimal amount of free fluid is noted surrounding the liver new from the prior exam. This is likely reactive to the small bowel obstructive change. No findings to suggest perforation are noted. Musculoskeletal: Degenerative changes of the lumbar spine are noted. No acute bony abnormality is seen. IMPRESSION: Small-bowel obstruction involving primarily the jejunum with a transition zone in the left anterior abdomen. Multiple loops are intimately associated with the anterior abdominal wall in this likely represents changes of adhesions from previous surgery. Patent aortic to SMA bypass graft. Minimal free fluid likely reactive in nature. Chronic changes as described above. Electronically Signed   By: Elta [place]  Lukens M.D.   On: 09/30/2018 13:14    Anti-infectives: Anti-infectives (From  admission, onward)   None      Assessment/Plan: Impression: Partial small bowel obstruction, resolving.  Bowel function returning.  No evidence of clinically relevant mesentery ischemia. Plan: Advance diet as tolerated.  Anticipate discharge in next 24 to 48 hours.  No need for surgical intervention at this time.  LOS: 1 day    Aviva Signs 10/01/2018

## 2018-10-01 NOTE — Progress Notes (Signed)
Patient tolerated full liquids well for lunch with no complaints. No reports of abdominal discomfort or nausea. Advanced to soft diet for evening meal per orders to advance as tolerated. Discussed with MD. Patient aware of need to notify nursing staff of any pain, nausea, discomfort. Donavan Foil, RN

## 2018-10-01 NOTE — Progress Notes (Signed)
Patient tolerated soft diet for evening meal with no complaints. Refused lovenox injection this evening. Educated on rationale for medication. Verbalized understanding but preferred not to take it. Ambulates in room. Notified Dr. Cruzita Lederer. Donavan Foil, RN

## 2018-10-01 NOTE — Progress Notes (Signed)
PROGRESS NOTE  Connor Bryant YNW:[id number] DOB: 1946-06-01 DOA: 09/30/2018 PCP: Terald Sleeper, PA-C   LOS: 1 day   Brief Narrative / Interim history: Connor Bryant is a 73 y.o. male with medical history of mesenteric ischemia, hypertension, diabetes mellitus, hyperlipidemia presenting with acute onset of abdominal pain on the evening of 09/29/2018.  Around 9:45 PM, the patient began feeling some abdominal pain with some nausea.  The patient woke up at 2 AM on 09/30/2018 and had an episode of nausea and vomiting which relieved his abdominal pain.  The patient went back to sleep, but he subsequently woke up again at 6 AM with worsening abdominal pain.  As result, he presents emergency department for further evaluation.  On the way to the emergency department, the patient had an episode of nausea and vomiting, but has not had any since.  In fact shortly after admission patient started having bowel movements  Subjective: -Feels better this morning, no longer has abdominal pain, no nausea or vomiting.  He wants to eat.  Had a bowel movement last night and has been passing gas throughout the night  Assessment & Plan: Active Problems:   Peripheral vascular disease (HCC)   Body mass index 30.0-30.9, adult   Essential hypertension   Type 2 diabetes mellitus without complication, without long-term current use of insulin (HCC)   Pure hypercholesterolemia   Small bowel obstruction (HCC)   Principal Problem Small bowel obstruction -Patient was admitted to the hospital with nausea vomiting, found to have a small bowel obstruction likely due to adhesions on CT scan of the abdomen and pelvis -General surgery was consulted and are following -This seems to be resolving as patient started having bowel movements and is passing gas.  Currently no longer vomiting -Advance diet to liquids, if tolerates will advance to soft -Hopefully home in the morning if he continues to improve  Active problems Essential  hypertension -Resume oral metoprolol, stopped IV.  Resume hydralazine and metoprolol at lower dose  Diabetes mellitus type 2, well controlled -08/26/2018 hemoglobin A1c 6.2 -Holding Actos and metformin -NovoLog sliding scale  Hyperlipidemia -Resume statin  History of mesenteric ischemia -Status post aortic- SMA bypass graft 2015 -Stop rectal aspirin and resume p.o. aspirin as he is able to have p.o. intake  Dehydration/CKD stage III -Continue IV fluids -Baseline creatinine 1.2-1.3 -Presenting creatinine 1.53 -Creatinine returned to baseline this morning, 1.3, stop IV fluids and allow diet   Scheduled Meds: . aspirin EC  81 mg Oral Daily  . enoxaparin (LOVENOX) injection  30 mg Subcutaneous Q24H  . insulin aspart  0-9 Units Subcutaneous Q4H   Continuous Infusions: . sodium chloride    . sodium chloride 75 mL/hr at 10/01/18 0713   PRN Meds:.acetaminophen **OR** acetaminophen, hydrALAZINE, morphine injection, ondansetron **OR** ondansetron (ZOFRAN) IV  DVT prophylaxis: Lovenox Code Status: Full code Family Communication: No family at bedside Disposition Plan: Home 24-48 hours, when cleared by general surgery  Consultants:   General surgery  Procedures:   None   Antimicrobials:  None    Objective: Vitals:   09/30/18 1656 09/30/18 2052 09/30/18 2108 10/01/18 0414  BP:   115/63 (!) 162/58  Pulse:   68 (!) 59  Resp:   16 16  Temp:   98.5 F (36.9 C) 98.5 F (36.9 C)  TempSrc:   Oral Oral  SpO2:  96% 96% 95%  Weight: 98.7 kg     Height: 5\' 9"  (1.753 m)       Intake/Output  Summary (Last 24 hours) at 10/01/2018 1055 Last data filed at 10/01/2018 0300 Gross per 24 hour  Intake 687.05 ml  Output -  Net 687.05 ml   Filed Weights   09/30/18 0950 09/30/18 1650 09/30/18 1656  Weight: 101.6 kg 99.8 kg 98.7 kg    Examination:  Constitutional: NAD Eyes: PERRL, lids and conjunctivae normal ENMT: Mucous membranes are moist. Neck: normal,  supple Respiratory: clear to auscultation bilaterally, no wheezing, no crackles. Normal respiratory effort.  Cardiovascular: Regular rate and rhythm, no new murmurs. No LE edema. 2+ pedal pulses.  Abdomen: no tenderness. Bowel sounds positive.  Abdominal bruit of renal arteries heard Musculoskeletal: no clubbing / cyanosis.  Skin: no rashes Neurologic: CN 2-12 grossly intact. Strength 5/5 in all 4.  Psychiatric: Normal judgment and insight. Alert and oriented x 3. Normal mood.    Data Reviewed: I have independently reviewed following labs and imaging studies   CBC: Recent Labs  Lab 09/30/18 1009 10/01/18 0505  WBC 9.2 5.7  HGB 13.5 10.8*  HCT 42.9 34.6*  MCV 94.7 97.5  PLT 213 314   Basic Metabolic Panel: Recent Labs  Lab 09/30/18 1009 10/01/18 0505  NA 139 141  K 4.3 3.5  CL 104 109  CO2 25 23  GLUCOSE 181* 128*  BUN 41* 34*  CREATININE 1.53* 1.35*  CALCIUM 10.1 8.8*   GFR: Estimated Creatinine Clearance: 56.5 mL/min (A) (by C-G formula based on SCr of 1.35 mg/dL (H)). Liver Function Tests: Recent Labs  Lab 09/30/18 1009  AST 23  ALT 26  ALKPHOS 66  BILITOT 0.7  PROT 8.0  ALBUMIN 4.5   Recent Labs  Lab 09/30/18 1009  LIPASE 55*   No results for input(s): AMMONIA in the last 168 hours. Coagulation Profile: No results for input(s): INR, PROTIME in the last 168 hours. Cardiac Enzymes: No results for input(s): CKTOTAL, CKMB, CKMBINDEX, TROPONINI in the last 168 hours. BNP (last 3 results) No results for input(s): PROBNP in the last 8760 hours. HbA1C: No results for input(s): HGBA1C in the last 72 hours. CBG: Recent Labs  Lab 09/30/18 1722 09/30/18 1956 09/30/18 2347 10/01/18 0358 10/01/18 0803  GLUCAP 121* 110* 122* 110* 118*   Lipid Profile: No results for input(s): CHOL, HDL, LDLCALC, TRIG, CHOLHDL, LDLDIRECT in the last 72 hours. Thyroid Function Tests: No results for input(s): TSH, T4TOTAL, FREET4, T3FREE, THYROIDAB in the last 72  hours. Anemia Panel: No results for input(s): VITAMINB12, FOLATE, FERRITIN, TIBC, IRON, RETICCTPCT in the last 72 hours. Urine analysis:    Component Value Date/Time   COLORURINE YELLOW 03/04/2014 [place] 03/04/2014 1358   LABSPEC 1.010 03/04/2014 1358   PHURINE 7.0 03/04/2014 1358   GLUCOSEU NEGATIVE 03/04/2014 1358   HGBUR NEGATIVE 03/04/2014 [place] 03/04/2014 1358   KETONESUR NEGATIVE 03/04/2014 1358   PROTEINUR NEGATIVE 03/04/2014 1358   UROBILINOGEN 1.0 03/04/2014 1358   NITRITE NEGATIVE 03/04/2014 1358   LEUKOCYTESUR NEGATIVE 03/04/2014 1358   Sepsis Labs: Invalid input(s): PROCALCITONIN, LACTICIDVEN  No results found for this or any previous visit (from the past 240 hour(s)).    Radiology Studies: Ct Abdomen Pelvis W Contrast  Result Date: 09/30/2018 CLINICAL DATA:  Abdominal pain and nausea for 2 days EXAM: CT ABDOMEN AND PELVIS WITH CONTRAST TECHNIQUE: Multidetector CT imaging of the abdomen and pelvis was performed using the standard protocol following bolus administration of intravenous contrast. CONTRAST:  118mL ISOVUE-300 IOPAMIDOL (ISOVUE-300) INJECTION 61% COMPARISON:  06/12/2016 FINDINGS: Lower chest:  Lung bases are free of acute infiltrate or sizable effusion. Aortic calcifications and coronary calcifications are noted. Hepatobiliary: The liver and gallbladder are within normal limits. Pancreas: Unremarkable. No pancreatic ductal dilatation or surrounding inflammatory changes. Spleen: Normal in size without focal abnormality. Adrenals/Urinary Tract: Adrenal glands demonstrate a small 1 cm nodule on the left adrenal gland likely representing a small adenoma stable from the previous exam. Right adrenal gland is within normal limits. The kidneys are well visualized bilaterally within normal enhancement pattern. Nonobstructing left renal stone is seen stable from the prior exam. Bladder is well distended. Stomach/Bowel: The appendix is  well visualized and within normal limits. Colon is decompressed. Proximal small bowel is dilated with a relative transition point in the distal jejunum along the anterior abdominal wall. Postsurgical changes are noted and this obstruction is likely related to underlying adhesions. No definitive mass is seen. The more distal small bowel is decompressed. The stomach is within normal limits. Vascular/Lymphatic: Diffuse aortic calcifications are identified. The celiac axis is patent. The superior mesenteric artery is occluded at its origin with evidence of a an aortic to SMA bypass graft which appears patent. No other vascular abnormality is noted. No significant lymphadenopathy is seen. Reproductive: Prostate is unremarkable. Other: No hernia is identified. A minimal amount of free fluid is noted surrounding the liver new from the prior exam. This is likely reactive to the small bowel obstructive change. No findings to suggest perforation are noted. Musculoskeletal: Degenerative changes of the lumbar spine are noted. No acute bony abnormality is seen. IMPRESSION: Small-bowel obstruction involving primarily the jejunum with a transition zone in the left anterior abdomen. Multiple loops are intimately associated with the anterior abdominal wall in this likely represents changes of adhesions from previous surgery. Patent aortic to SMA bypass graft. Minimal free fluid likely reactive in nature. Chronic changes as described above. Electronically Signed   By: Inez Catalina M.D.   On: 09/30/2018 13:14     Marzetta Board, MD, PhD [facility]  Contact via  [email or web address]  [place] P: [phone]  F: [phone]

## 2018-10-02 LAB — GLUCOSE, CAPILLARY
GLUCOSE-CAPILLARY: 100 mg/dL — AB (ref 70–99)
Glucose-Capillary: 123 mg/dL — ABNORMAL HIGH (ref 70–99)
Glucose-Capillary: 122 mg/dL — ABNORMAL HIGH (ref 70–99)

## 2018-10-02 LAB — BASIC METABOLIC PANEL
Anion gap: 8 (ref 5–15)
BUN: 24 mg/dL — ABNORMAL HIGH (ref 8–23)
CALCIUM: 8.9 mg/dL (ref 8.9–10.3)
CO2: 24 mmol/L (ref 22–32)
Chloride: 109 mmol/L (ref 98–111)
Creatinine, Ser: 1.21 mg/dL (ref 0.61–1.24)
GFR calc Af Amer: >60 mL/min (ref >60)
GFR calc non Af Amer: 59 mL/min — ABNORMAL LOW (ref >60)
Glucose, Bld: 137 mg/dL — ABNORMAL HIGH (ref 70–99)
Potassium: 4.1 mmol/L (ref 3.5–5.1)
Sodium: 141 mmol/L (ref 135–145)

## 2018-10-02 MED ORDER — POLYETHYLENE GLYCOL 3350 17 G PO PACK: 17.0000 g | PACK | Freq: Every day | ORAL | 1 refills | Status: DC

## 2018-10-02 MED ORDER — POLYETHYLENE GLYCOL 3350 17 G PO PACK
17.0000 g | PACK | Freq: Once | ORAL | Status: AC
  Administered 2018-10-02: 17 g via ORAL
  Filled 2018-10-02: qty 1

## 2018-10-02 NOTE — Discharge Instructions (Signed)
Follow with Terald Sleeper, PA-C in 5-7 days  Please get a complete blood count and chemistry panel checked by your Primary MD at your next visit, and again as instructed by your Primary MD. Please get your medications reviewed and adjusted by your Primary MD.  Please request your Primary MD to go over all Hospital Tests and Procedure/Radiological results at the follow up, please get all Hospital records sent to your Prim MD by signing hospital release before you go home.  If you had Pneumonia of Lung problems at the Hospital: Please get a 2 view Chest X ray done in 6-8 weeks after hospital discharge or sooner if instructed by your Primary MD.  If you have Congestive Heart Failure: Please call your Cardiologist or Primary MD anytime you have any of the following symptoms:  1) 3 pound weight gain in 24 hours or 5 pounds in 1 week  2) shortness of breath, with or without a dry hacking cough  3) swelling in the hands, feet or stomach  4) if you have to sleep on extra pillows at night in order to breathe  Follow cardiac low salt diet and 1.5 lit/day fluid restriction.  If you have diabetes Accuchecks 4 times/day, Once in AM empty stomach and then before each meal. Log in all results and show them to your primary doctor at your next visit. If any glucose reading is under 80 or above 300 call your primary MD immediately.  If you have Seizure/Convulsions/Epilepsy: Please do not drive, operate heavy machinery, participate in activities at heights or participate in high speed sports until you have seen by Primary MD or a Neurologist and advised to do so again.  If you had Gastrointestinal Bleeding: Please ask your Primary MD to check a complete blood count within one week of discharge or at your next visit. Your endoscopic/colonoscopic biopsies that are pending at the time of discharge, will also need to followed by your Primary MD.  Get Medicines reviewed and adjusted. Please take all your  medications with you for your next visit with your Primary MD  Please request your Primary MD to go over all hospital tests and procedure/radiological results at the follow up, please ask your Primary MD to get all Hospital records sent to his/her office.  If you experience worsening of your admission symptoms, develop shortness of breath, life threatening emergency, suicidal or homicidal thoughts you must seek medical attention immediately by calling 911 or calling your MD immediately  if symptoms less severe.  You must read complete instructions/literature along with all the possible adverse reactions/side effects for all the Medicines you take and that have been prescribed to you. Take any new Medicines after you have completely understood and accpet all the possible adverse reactions/side effects.   Do not drive or operate heavy machinery when taking Pain medications.   Do not take more than prescribed Pain, Sleep and Anxiety Medications  Special Instructions: If you have smoked or chewed Tobacco  in the last 2 yrs please stop smoking, stop any regular Alcohol  and or any Recreational drug use.  Wear Seat belts while driving.  Please note You were cared for by a hospitalist during your hospital stay. If you have any questions about your discharge medications or the care you received while you were in the hospital after you are discharged, you can call the unit and asked to speak with the hospitalist on call if the hospitalist that took care of you is not available.  Once you are discharged, your primary care physician will handle any further medical issues. Please note that NO REFILLS for any discharge medications will be authorized once you are discharged, as it is imperative that you return to your primary care physician (or establish a relationship with a primary care physician if you do not have one) for your aftercare needs so that they can reassess your need for medications and monitor your  lab values.  You can reach the hospitalist office at phone [phone] or fax [phone]   If you do not have a primary care physician, you can call [phone] for a physician referral.  Activity: As tolerated with Full fall precautions use walker/cane & assistance as needed  Diet: regular  Disposition Home

## 2018-10-02 NOTE — Discharge Summary (Signed)
Physician Discharge Summary  Connor Bryant RXV:[id number] DOB: 1946/03/13 DOA: 09/30/2018  PCP: Terald Sleeper, PA-C  Admit date: 09/30/2018 Discharge date: 10/02/2018  Admitted From: home Disposition:  home  Recommendations for Outpatient Follow-up:  1. Follow up with PCP in 1-2 weeks  Home Health: none Equipment/Devices: none  Discharge Condition: stable CODE STATUS: Full code Diet recommendation: regular  HPI: Per admitting MD, Connor Bryant is a 73 y.o. male with medical history of mesenteric ischemia, hypertension, diabetes mellitus, hyperlipidemia presenting with acute onset of abdominal pain on the evening of 09/29/2018.  Around 9:45 PM, the patient began feeling some abdominal pain with some nausea.  The patient woke up at 2 AM on 09/30/2018 and had an episode of nausea and vomiting which relieved his abdominal pain.  The patient went back to sleep, but he subsequently woke up again at 6 AM with worsening abdominal pain.  As result, he presents emergency department for further evaluation.  On the way to the emergency department, the patient had an episode of nausea and vomiting, but has not had any emesis since arrival to the emergency department.  He currently rates his pain as 2/10.  He is not passing any flatus.  His last bowel movement was in the morning of 09/29/2018.  He denies any fevers, chills, chest pain, shortness breath, hematochezia, melena, diarrhea, dysuria, hematuria.  In the emergency department, the patient was afebrile hemodynamically stable saturating 98% on room air.  BMP was unremarkable except for serum creatinine 1.53.  Hepatic panel and CBC were essentially unremarkable.  CT abdomen and pelvis showed small bowel obstruction involving the jejunum with a transition zone in the left anterior abdomen.  There are multiple loops of bowel intimately associated with the anterior abdominal wall consistent with adhesions.  There is also note made of a patent aortic--SMA bypass  graft.  General surgery was consulted to assist with management  Hospital Course:  Small bowel obstruction -Patient was admitted to the hospital with nausea vomiting, found to have a small bowel obstruction likely due to adhesions on CT scan of the abdomen and pelvis.  General surgery was consulted and followed patient while hospitalized.  He was placed on conservative management with n.p.o., IV fluids, and clinically small bowel obstruction appears to have resolved.  He was started have bowel movements, able to tolerate a regular diet without difficulties.  Discussed with Dr. Arnoldo Morale from neurosurgery, patient okay for home discharge.  Active problems Essential hypertension -continue home medications on discharge Diabetes mellitus type 2, well controlled -08/26/2018 hemoglobin A1c 6.2, continue home medications on discharge Hyperlipidemia -Resume statin History of mesenteric ischemia -Status post aortic- SMA bypass graft 2015, continue home medications at discharge Dehydration/CKD stage III -Baseline creatinine around 1.1-1.3, on admission it was elevated to 1.5.  Return to baseline around 1.2 on discharge.   Discharge Diagnoses:  Active Problems:   Peripheral vascular disease (HCC)   Body mass index 30.0-30.9, adult   Essential hypertension   Type 2 diabetes mellitus without complication, without long-term current use of insulin (HCC)   Pure hypercholesterolemia   Small bowel obstruction ([place])     Discharge Instructions   Allergies as of 10/02/2018   No Known Allergies     Medication List    STOP taking these medications   naproxen 500 MG tablet Commonly known as:  NAPROSYN     TAKE these medications   allopurinol 100 MG tablet Commonly known as:  ZYLOPRIM TAKE 1 TABLET 2 TO  3 TIMES A DAY FOR GOUT   aspirin 81 MG tablet Take 81 mg by mouth daily.   atorvastatin 20 MG tablet Commonly known as:  LIPITOR TAKE 1 TABLET DAILY   hydrALAZINE 50 MG tablet Commonly known  as:  APRESOLINE Take 1 tablet (50 mg total) by mouth 3 (three) times daily.   hydrochlorothiazide 25 MG tablet Commonly known as:  HYDRODIURIL TAKE 1 OR 2 TABLETS DAILY IN THE MORNING AS NEEDED   loratadine 10 MG tablet Commonly known as:  CLARITIN Take 1 tablet (10 mg total) by mouth daily.   metFORMIN 500 MG 24 hr tablet Commonly known as:  GLUCOPHAGE-XR 2 tablet with evening meal Once a day   metoprolol 200 MG 24 hr tablet Commonly known as:  TOPROL-XL Take 1 tablet (200 mg total) by mouth daily.   pioglitazone 30 MG tablet Commonly known as:  ACTOS Take 1 tablet (30 mg total) by mouth daily.   polyethylene glycol packet Commonly known as:  MIRALAX / GLYCOLAX Take 17 g by mouth daily.   primidone 50 MG tablet Commonly known as:  MYSOLINE Take 1 tablet (50 mg total) by mouth 3 (three) times daily.   trandolapril 4 MG tablet Commonly known as:  MAVIK Take 1 tablet (4 mg total) by mouth daily.   verapamil 240 MG CR tablet Commonly known as:  CALAN-SR Take 2 tablets (480 mg total) by mouth daily.        Consultations:  General surgery  Procedures/Studies:  Ct Abdomen Pelvis W Contrast  Result Date: 09/30/2018 CLINICAL DATA:  Abdominal pain and nausea for 2 days EXAM: CT ABDOMEN AND PELVIS WITH CONTRAST TECHNIQUE: Multidetector CT imaging of the abdomen and pelvis was performed using the standard protocol following bolus administration of intravenous contrast. CONTRAST:  181mL ISOVUE-300 IOPAMIDOL (ISOVUE-300) INJECTION 61% COMPARISON:  06/12/2016 FINDINGS: Lower chest: Lung bases are free of acute infiltrate or sizable effusion. Aortic calcifications and coronary calcifications are noted. Hepatobiliary: The liver and gallbladder are within normal limits. Pancreas: Unremarkable. No pancreatic ductal dilatation or surrounding inflammatory changes. Spleen: Normal in size without focal abnormality. Adrenals/Urinary Tract: Adrenal glands demonstrate a small 1 cm nodule on  the left adrenal gland likely representing a small adenoma stable from the previous exam. Right adrenal gland is within normal limits. The kidneys are well visualized bilaterally within normal enhancement pattern. Nonobstructing left renal stone is seen stable from the prior exam. Bladder is well distended. Stomach/Bowel: The appendix is well visualized and within normal limits. Colon is decompressed. Proximal small bowel is dilated with a relative transition point in the distal jejunum along the anterior abdominal wall. Postsurgical changes are noted and this obstruction is likely related to underlying adhesions. No definitive mass is seen. The more distal small bowel is decompressed. The stomach is within normal limits. Vascular/Lymphatic: Diffuse aortic calcifications are identified. The celiac axis is patent. The superior mesenteric artery is occluded at its origin with evidence of a an aortic to SMA bypass graft which appears patent. No other vascular abnormality is noted. No significant lymphadenopathy is seen. Reproductive: Prostate is unremarkable. Other: No hernia is identified. A minimal amount of free fluid is noted surrounding the liver new from the prior exam. This is likely reactive to the small bowel obstructive change. No findings to suggest perforation are noted. Musculoskeletal: Degenerative changes of the lumbar spine are noted. No acute bony abnormality is seen. IMPRESSION: Small-bowel obstruction involving primarily the jejunum with a transition zone in the left anterior abdomen.  Multiple loops are intimately associated with the anterior abdominal wall in this likely represents changes of adhesions from previous surgery. Patent aortic to SMA bypass graft. Minimal free fluid likely reactive in nature. Chronic changes as described above. Electronically Signed   By: Inez Catalina M.D.   On: 09/30/2018 13:14     Subjective: - no chest pain, shortness of breath, no abdominal pain, nausea or  vomiting.   Discharge Exam: Vitals:   10/02/18 0810 10/02/18 0949  BP: (!) 180/52 (!) 169/52  Pulse: (!) 53 64  Resp:    Temp:    SpO2: 97%     General: Pt is alert, awake, not in acute distress Cardiovascular: RRR, S1/S2 +, no rubs, no gallops Respiratory: CTA bilaterally, no wheezing, no rhonchi Abdominal: Soft, NT, ND, bowel sounds + Extremities: no edema, no cyanosis    The results of significant diagnostics from this hospitalization (including imaging, microbiology, ancillary and laboratory) are listed below for reference.     Microbiology: No results found for this or any previous visit (from the past 240 hour(s)).   Labs: BNP (last 3 results) No results for input(s): BNP in the last 8760 hours. Basic Metabolic Panel: Recent Labs  Lab 09/30/18 1009 10/01/18 0505 10/02/18 0603  NA 139 141 141  K 4.3 3.5 4.1  CL 104 109 109  CO2 25 23 24   GLUCOSE 181* 128* 137*  BUN 41* 34* 24*  CREATININE 1.53* 1.35* 1.21  CALCIUM 10.1 8.8* 8.9   Liver Function Tests: Recent Labs  Lab 09/30/18 1009  AST 23  ALT 26  ALKPHOS 66  BILITOT 0.7  PROT 8.0  ALBUMIN 4.5   Recent Labs  Lab 09/30/18 1009  LIPASE 55*   No results for input(s): AMMONIA in the last 168 hours. CBC: Recent Labs  Lab 09/30/18 1009 10/01/18 0505  WBC 9.2 5.7  HGB 13.5 10.8*  HCT 42.9 34.6*  MCV 94.7 97.5  PLT 213 162   Cardiac Enzymes: No results for input(s): CKTOTAL, CKMB, CKMBINDEX, TROPONINI in the last 168 hours. BNP: Invalid input(s): POCBNP CBG: Recent Labs  Lab 10/01/18 1625 10/01/18 1957 10/02/18 0002 10/02/18 0409 10/02/18 0746  GLUCAP 91 142* 100* 123* 122*   D-Dimer No results for input(s): DDIMER in the last 72 hours. Hgb A1c No results for input(s): HGBA1C in the last 72 hours. Lipid Profile No results for input(s): CHOL, HDL, LDLCALC, TRIG, CHOLHDL, LDLDIRECT in the last 72 hours. Thyroid function studies No results for input(s): TSH, T4TOTAL, T3FREE,  THYROIDAB in the last 72 hours.  Invalid input(s): FREET3 Anemia work up No results for input(s): VITAMINB12, FOLATE, FERRITIN, TIBC, IRON, RETICCTPCT in the last 72 hours. Urinalysis    Component Value Date/Time   COLORURINE YELLOW 03/04/2014 1358   APPEARANCEUR CLEAR 03/04/2014 1358   LABSPEC 1.010 03/04/2014 1358   PHURINE 7.0 03/04/2014 1358   GLUCOSEU NEGATIVE 03/04/2014 1358   HGBUR NEGATIVE 03/04/2014 1358   BILIRUBINUR NEGATIVE 03/04/2014 1358   KETONESUR NEGATIVE 03/04/2014 1358   PROTEINUR NEGATIVE 03/04/2014 1358   UROBILINOGEN 1.0 03/04/2014 1358   NITRITE NEGATIVE 03/04/2014 1358   LEUKOCYTESUR NEGATIVE 03/04/2014 1358   Sepsis Labs Invalid input(s): PROCALCITONIN,  WBC,  LACTICIDVEN   Time coordinating discharge: 25 minutes  SIGNED:  Marzetta Board, MD  [facility] 10/02/2018, 3:38 PM

## 2018-10-02 NOTE — Progress Notes (Signed)
Discharge instructions reviewed with patient, all questions answered. IV removed and patient escorted via wheelchair. Pt removed all belongings from room.

## 2018-10-02 NOTE — Progress Notes (Signed)
Subjective: Patient denies any abdominal pain.  No nausea or vomiting noted.  He is passing flatus.  He has had a bowel movement.  He just started his regular diet.  He desires discharge.  Objective: Vital signs in last 24 hours: Temp:  [97.9 F (36.6 C)-98.3 F (36.8 C)] 98 F (36.7 C) (01/09 0607) Pulse Rate:  [55-72] 55 (01/09 0607) Resp:  [16-20] 20 (01/09 0607) BP: (149-189)/(46-69) 178/59 (01/09 0607) SpO2:  [97 %-99 %] 97 % (01/09 0607) Last BM Date: 09/30/18  Intake/Output from previous day: 01/08 0701 - 01/09 0700 In: 1464.8 [P.O.:840; I.V.:624.8] Out: -  Intake/Output this shift: No intake/output data recorded.  General appearance: alert, cooperative and no distress GI: soft, non-tender; bowel sounds normal; no masses,  no organomegaly  Lab Results:  Recent Labs    09/30/18 1009 10/01/18 0505  WBC 9.2 5.7  HGB 13.5 10.8*  HCT 42.9 34.6*  PLT 213 162   BMET Recent Labs    10/01/18 0505 10/02/18 0603  NA 141 141  K 3.5 4.1  CL 109 109  CO2 23 24  GLUCOSE 128* 137*  BUN 34* 24*  CREATININE 1.35* 1.21  CALCIUM 8.8* 8.9   PT/INR No results for input(s): LABPROT, INR in the last 72 hours.  Studies/Results: Ct Abdomen Pelvis W Contrast  Result Date: 09/30/2018 CLINICAL DATA:  Abdominal pain and nausea for 2 days EXAM: CT ABDOMEN AND PELVIS WITH CONTRAST TECHNIQUE: Multidetector CT imaging of the abdomen and pelvis was performed using the standard protocol following bolus administration of intravenous contrast. CONTRAST:  160mL ISOVUE-300 IOPAMIDOL (ISOVUE-300) INJECTION 61% COMPARISON:  06/12/2016 FINDINGS: Lower chest: Lung bases are free of acute infiltrate or sizable effusion. Aortic calcifications and coronary calcifications are noted. Hepatobiliary: The liver and gallbladder are within normal limits. Pancreas: Unremarkable. No pancreatic ductal dilatation or surrounding inflammatory changes. Spleen: Normal in size without focal abnormality.  Adrenals/Urinary Tract: Adrenal glands demonstrate a small 1 cm nodule on the left adrenal gland likely representing a small adenoma stable from the previous exam. Right adrenal gland is within normal limits. The kidneys are well visualized bilaterally within normal enhancement pattern. Nonobstructing left renal stone is seen stable from the prior exam. Bladder is well distended. Stomach/Bowel: The appendix is well visualized and within normal limits. Colon is decompressed. Proximal small bowel is dilated with a relative transition point in the distal jejunum along the anterior abdominal wall. Postsurgical changes are noted and this obstruction is likely related to underlying adhesions. No definitive mass is seen. The more distal small bowel is decompressed. The stomach is within normal limits. Vascular/Lymphatic: Diffuse aortic calcifications are identified. The celiac axis is patent. The superior mesenteric artery is occluded at its origin with evidence of a an aortic to SMA bypass graft which appears patent. No other vascular abnormality is noted. No significant lymphadenopathy is seen. Reproductive: Prostate is unremarkable. Other: No hernia is identified. A minimal amount of free fluid is noted surrounding the liver new from the prior exam. This is likely reactive to the small bowel obstructive change. No findings to suggest perforation are noted. Musculoskeletal: Degenerative changes of the lumbar spine are noted. No acute bony abnormality is seen. IMPRESSION: Small-bowel obstruction involving primarily the jejunum with a transition zone in the left anterior abdomen. Multiple loops are intimately associated with the anterior abdominal wall in this likely represents changes of adhesions from previous surgery. Patent aortic to SMA bypass graft. Minimal free fluid likely reactive in nature. Chronic changes as described  above. Electronically Signed   By: Inez Catalina M.D.   On: 09/30/2018 13:14     Anti-infectives: Anti-infectives (From admission, onward)   None      Assessment/Plan: Impression: Partial small bowel obstruction, resolving. Plan: Okay to for discharge from surgery standpoint.  Will give MiraLAX prior to discharge.  LOS: 2 days    Av[facility] Signs 10/02/2018

## 2018-10-09 ENCOUNTER — Ambulatory Visit (HOSPITAL_COMMUNITY): Payer: Medicare Other

## 2018-10-16 ENCOUNTER — Ambulatory Visit (HOSPITAL_COMMUNITY)
Admission: RE | Admit: 2018-10-16 | Discharge: 2018-10-16 | Disposition: A | Discharge status: Home / Self Care | Payer: Medicare Other | Source: Ambulatory Visit | Attending: Physician Assistant | Admitting: Physician Assistant

## 2018-10-16 DIAGNOSIS — M545 Low back pain: Secondary | ICD-10-CM | POA: Diagnosis not present

## 2018-10-16 DIAGNOSIS — G8929 Other chronic pain: Secondary | ICD-10-CM | POA: Diagnosis not present

## 2018-10-16 DIAGNOSIS — M5440 Lumbago with sciatica, unspecified side: Secondary | ICD-10-CM | POA: Insufficient documentation

## 2018-10-16 DIAGNOSIS — M5442 Lumbago with sciatica, left side: Secondary | ICD-10-CM | POA: Insufficient documentation

## 2018-10-27 ENCOUNTER — Encounter: Payer: Self-pay | Admitting: Physician Assistant

## 2018-10-27 ENCOUNTER — Ambulatory Visit (INDEPENDENT_AMBULATORY_CARE_PROVIDER_SITE_OTHER): Payer: Medicare Other | Admitting: Physician Assistant

## 2018-10-27 VITALS — BP 186/63 | HR 66 | Temp 97.6°F | Ht 69.0 in | Wt 225.2 lb

## 2018-10-27 DIAGNOSIS — S22080S Wedge compression fracture of T11-T12 vertebra, sequela: Secondary | ICD-10-CM | POA: Diagnosis not present

## 2018-10-27 DIAGNOSIS — M4807 Spinal stenosis, lumbosacral region: Secondary | ICD-10-CM | POA: Diagnosis not present

## 2018-10-27 DIAGNOSIS — S22080A Wedge compression fracture of T11-T12 vertebra, initial encounter for closed fracture: Secondary | ICD-10-CM | POA: Insufficient documentation

## 2018-10-27 MED ORDER — GABAPENTIN 100 MG PO CAPS: 100.0000 mg | ORAL_CAPSULE | Freq: Every day | ORAL | 3 refills | Status: DC

## 2018-10-28 NOTE — Progress Notes (Signed)
BP (!) 186/63   Pulse 66   Temp 97.6 F (36.4 C) (Oral)   Ht 5\' 9"  (1.753 m)   Wt 225 lb 3.2 oz (102.2 kg)   BMI 33.26 kg/m  con  Subjective:    Patient ID: Connor Bryant, male    DOB: 12-Jun-1946, 73 y.o.   MRN: [id number]  HPI: Connor Bryant is a 73 y.o. male presenting on 10/27/2018 for Back Pain (and discuss MRI)  This patient comes in for periodic recheck on medications and conditions including T12 compression fracture and spinal stenosis at multiple levels. He does not want to take pain medication. He does want to try gabapentin to see if he can get relief. Later he will try neurosurgery consult.   All medications are reviewed today. There are no reports of any problems with the medications. All of the medical conditions are reviewed and updated.  Lab work is reviewed and will be ordered as medically necessary. There are no new problems reported with today's visit.   Past Medical History:  Diagnosis Date  . Arthritis   . DM type 2 (diabetes mellitus, type 2) (HCC)    no meds  . Gallstone   . Heart murmur    was told one time he had a murmur, not since 30 years ago  . HTN (hypertension)   . Hyperlipidemia   . Personal history of colonic polyp-adenoma 11/24/2013  . Renal insufficiency    mild  . Thoracic spine fracture [facility])    Relevant past medical, surgical, family and social history reviewed and updated as indicated. Interim medical history since our last visit reviewed. Allergies and medications reviewed and updated. DATA REVIEWED: CHART IN EPIC  Family History reviewed for pertinent findings.  Review of Systems  Constitutional: Negative.  Negative for appetite change and fatigue.  Eyes: Negative for pain and visual disturbance.  Respiratory: Negative.  Negative for cough, chest tightness, shortness of breath and wheezing.   Cardiovascular: Negative.  Negative for chest pain, palpitations and leg swelling.  Gastrointestinal: Negative.  Negative for abdominal  pain, diarrhea, nausea and vomiting.  Genitourinary: Negative.   Musculoskeletal: Positive for arthralgias, back pain and myalgias.  Skin: Negative.  Negative for color change and rash.  Neurological: Negative.  Negative for weakness, numbness and headaches.  Psychiatric/Behavioral: Negative.     Allergies as of 10/27/2018   No Known Allergies     Medication List       Accurate as of October 27, 2018 11:59 PM. Always use your most recent med list.        allopurinol 100 MG tablet Commonly known as:  ZYLOPRIM TAKE 1 TABLET 2 TO 3 TIMES A DAY FOR GOUT   aspirin 81 MG tablet Take 81 mg by mouth daily.   atorvastatin 20 MG tablet Commonly known as:  LIPITOR TAKE 1 TABLET DAILY   gabapentin 100 MG capsule Commonly known as:  NEURONTIN Take 1-3 capsules (100-300 mg total) by mouth at bedtime.   hydrALAZINE 50 MG tablet Commonly known as:  APRESOLINE Take 1 tablet (50 mg total) by mouth 3 (three) times daily.   hydrochlorothiazide 25 MG tablet Commonly known as:  HYDRODIURIL TAKE 1 OR 2 TABLETS DAILY IN THE MORNING AS NEEDED   loratadine 10 MG tablet Commonly known as:  CLARITIN Take 1 tablet (10 mg total) by mouth daily.   metFORMIN 500 MG 24 hr tablet Commonly known as:  GLUCOPHAGE-XR 2 tablet with evening meal Once a day  metoprolol 200 MG 24 hr tablet Commonly known as:  TOPROL-XL Take 1 tablet (200 mg total) by mouth daily.   pioglitazone 30 MG tablet Commonly known as:  ACTOS Take 1 tablet (30 mg total) by mouth daily.   polyethylene glycol packet Commonly known as:  MIRALAX / GLYCOLAX Take 17 g by mouth daily.   trandolapril 4 MG tablet Commonly known as:  MAVIK Take 1 tablet (4 mg total) by mouth daily.   verapamil 240 MG CR tablet Commonly known as:  CALAN-SR Take 2 tablets (480 mg total) by mouth daily.          Objective:    BP (!) 186/63   Pulse 66   Temp 97.6 F (36.4 C) (Oral)   Ht 5\' 9"  (1.753 m)   Wt 225 lb 3.2 oz (102.2 kg)    BMI 33.26 kg/m   No Known Allergies  Wt Readings from Last 3 Encounters:  10/27/18 225 lb 3.2 oz (102.2 kg)  09/30/18 217 lb 9.5 oz (98.7 kg)  08/26/18 224 lb 6.4 oz (101.8 kg)    Physical Exam Vitals signs and nursing note reviewed.  Constitutional:      General: He is not in acute distress.    Appearance: He is well-developed.  HENT:     Head: Normocephalic and atraumatic.  Eyes:     Conjunctiva/sclera: Conjunctivae normal.     Pupils: Pupils are equal, round, and reactive to light.  Cardiovascular:     Rate and Rhythm: Normal rate and regular rhythm.     Heart sounds: Normal heart sounds.  Pulmonary:     Effort: Pulmonary effort is normal. No respiratory distress.     Breath sounds: Normal breath sounds.  Musculoskeletal:     Lumbar back: He exhibits decreased range of motion, tenderness and pain.       Back:  Skin:    General: Skin is warm and dry.  Psychiatric:        Behavior: Behavior normal.     Results for orders placed or performed during the hospital encounter of 09/30/18  Lipase, blood  Result Value Ref Range   Lipase 55 (H) 11 - 51 U/L  Comprehensive metabolic panel  Result Value Ref Range   Sodium 139 135 - 145 mmol/L   Potassium 4.3 3.5 - 5.1 mmol/L   Chloride 104 98 - 111 mmol/L   CO2 25 22 - 32 mmol/L   Glucose, Bld 181 (H) 70 - 99 mg/dL   BUN 41 (H) 8 - 23 mg/dL   Creatinine, Ser 1.53 (H) 0.61 - 1.24 mg/dL   Calcium 10.1 8.9 - 10.3 mg/dL   Total Protein 8.0 6.5 - 8.1 g/dL   Albumin 4.5 3.5 - 5.0 g/dL   AST 23 15 - 41 U/L   ALT 26 0 - 44 U/L   Alkaline Phosphatase 66 38 - 126 U/L   Total Bilirubin 0.7 0.3 - 1.2 mg/dL   GFR calc non Af Amer 44 (L) >60 mL/min   GFR calc Af Amer 52 (L) >60 mL/min   Anion gap 10 5 - 15  CBC  Result Value Ref Range   WBC 9.2 4.0 - 10.5 K/uL   RBC 4.53 4.22 - 5.81 MIL/uL   Hemoglobin 13.5 13.0 - 17.0 g/dL   HCT 42.9 39.0 - 52.0 %   MCV 94.7 80.0 - 100.0 fL   MCH 29.8 26.0 - 34.0 pg   MCHC 31.5 30.0 - 36.0  g/dL   RDW  14.7 11.5 - 15.5 %   Platelets 213 150 - 400 K/uL   nRBC 0.0 0.0 - 0.2 %  Basic metabolic panel  Result Value Ref Range   Sodium 141 135 - 145 mmol/L   Potassium 3.5 3.5 - 5.1 mmol/L   Chloride 109 98 - 111 mmol/L   CO2 23 22 - 32 mmol/L   Glucose, Bld 128 (H) 70 - 99 mg/dL   BUN 34 (H) 8 - 23 mg/dL   Creatinine, Ser 1.35 (H) 0.61 - 1.24 mg/dL   Calcium 8.8 (L) 8.9 - 10.3 mg/dL   GFR calc non Af Amer 52 (L) >60 mL/min   GFR calc Af Amer 60 (L) >60 mL/min   Anion gap 9 5 - 15  CBC  Result Value Ref Range   WBC 5.7 4.0 - 10.5 K/uL   RBC 3.55 (L) 4.22 - 5.81 MIL/uL   Hemoglobin 10.8 (L) 13.0 - 17.0 g/dL   HCT 34.6 (L) 39.0 - 52.0 %   MCV 97.5 80.0 - 100.0 fL   MCH 30.4 26.0 - 34.0 pg   MCHC 31.2 30.0 - 36.0 g/dL   RDW 14.7 11.5 - 15.5 %   Platelets 162 150 - 400 K/uL   nRBC 0.0 0.0 - 0.2 %  Glucose, capillary  Result Value Ref Range   Glucose-Capillary 121 (H) 70 - 99 mg/dL   Comment 1 Notify RN    Comment 2 Document in Chart   Glucose, capillary  Result Value Ref Range   Glucose-Capillary 110 (H) 70 - 99 mg/dL  Glucose, capillary  Result Value Ref Range   Glucose-Capillary 122 (H) 70 - 99 mg/dL  Glucose, capillary  Result Value Ref Range   Glucose-Capillary 110 (H) 70 - 99 mg/dL  Glucose, capillary  Result Value Ref Range   Glucose-Capillary 118 (H) 70 - 99 mg/dL  Glucose, capillary  Result Value Ref Range   Glucose-Capillary 108 (H) 70 - 99 mg/dL  Glucose, capillary  Result Value Ref Range   Glucose-Capillary 91 70 - 99 mg/dL  Basic metabolic panel  Result Value Ref Range   Sodium 141 135 - 145 mmol/L   Potassium 4.1 3.5 - 5.1 mmol/L   Chloride 109 98 - 111 mmol/L   CO2 24 22 - 32 mmol/L   Glucose, Bld 137 (H) 70 - 99 mg/dL   BUN 24 (H) 8 - 23 mg/dL   Creatinine, Ser 1.21 0.61 - 1.24 mg/dL   Calcium 8.9 8.9 - 10.3 mg/dL   GFR calc non Af Amer 59 (L) >60 mL/min   GFR calc Af Amer >60 >60 mL/min   Anion gap 8 5 - 15  Glucose, capillary  Result  Value Ref Range   Glucose-Capillary 142 (H) 70 - 99 mg/dL  Glucose, capillary  Result Value Ref Range   Glucose-Capillary 100 (H) 70 - 99 mg/dL  Glucose, capillary  Result Value Ref Range   Glucose-Capillary 123 (H) 70 - 99 mg/dL  Glucose, capillary  Result Value Ref Range   Glucose-Capillary 122 (H) 70 - 99 mg/dL      Assessment & Plan:   1. Compression fracture of T12 vertebra, sequela - gabapentin (NEURONTIN) 100 MG capsule; Take 1-3 capsules (100-300 mg total) by mouth at bedtime.  Dispense: 90 capsule; Refill: 3  2. Spinal stenosis of lumbosacral region - gabapentin (NEURONTIN) 100 MG capsule; Take 1-3 capsules (100-300 mg total) by mouth at bedtime.  Dispense: 90 capsule; Refill: 3   Continue all other maintenance  medications as listed above.  Follow up plan: Return in about 4 weeks (around 11/24/2018) for recheck.  Educational handout given for [place] PA-C [place] [address]  [place], [place] [address] [phone]   10/28/2018, 8:48 PM

## 2018-12-01 ENCOUNTER — Ambulatory Visit (INDEPENDENT_AMBULATORY_CARE_PROVIDER_SITE_OTHER): Payer: Medicare Other | Admitting: Physician Assistant

## 2018-12-01 ENCOUNTER — Encounter: Payer: Self-pay | Admitting: Physician Assistant

## 2018-12-01 VITALS — BP 132/56 | HR 52 | Temp 97.0°F | Ht 69.0 in | Wt 224.4 lb

## 2018-12-01 DIAGNOSIS — M5440 Lumbago with sciatica, unspecified side: Secondary | ICD-10-CM | POA: Diagnosis not present

## 2018-12-01 DIAGNOSIS — M4807 Spinal stenosis, lumbosacral region: Secondary | ICD-10-CM

## 2018-12-01 DIAGNOSIS — Z0000 Encounter for general adult medical examination without abnormal findings: Secondary | ICD-10-CM

## 2018-12-02 NOTE — Progress Notes (Signed)
BP (!) 132/56   Pulse (!) 52   Temp (!) 97 F (36.1 C) (Oral)   Ht 5\' 9"  (1.753 m)   Wt 224 lb 6.4 oz (101.8 kg)   BMI 33.14 kg/m    Subjective:    Patient ID: Connor Bryant, male    DOB: 1946-07-23, 73 y.o.   MRN: [id number]  HPI: Connor Bryant is a 73 y.o. male presenting on 12/01/2018 for Back Pain (4 week follow up )  MRI 2020 Chronic compression fracture T12.  No acute fracture Multilevel degenerative change as above. Mild spinal stenosis at L4-5.  He has been trying to move more and started the gabapentin without trouble. He is able to increase the medication for more pain relief.  He does not want to take any narcotic pain medication.  Past Medical History:  Diagnosis Date  . Arthritis   . DM type 2 (diabetes mellitus, type 2) (HCC)    no meds  . Gallstone   . Heart murmur    was told one time he had a murmur, not since 30 years ago  . HTN (hypertension)   . Hyperlipidemia   . Personal history of colonic polyp-adenoma 11/24/2013  . Renal insufficiency    mild  . Thoracic spine fracture [facility])    Relevant past medical, surgical, family and social history reviewed and updated as indicated. Interim medical history since our last visit reviewed. Allergies and medications reviewed and updated. DATA REVIEWED: CHART IN EPIC  Family History reviewed for pertinent findings.  Review of Systems  Constitutional: Negative.  Negative for appetite change and fatigue.  Eyes: Negative for pain and visual disturbance.  Respiratory: Negative.  Negative for cough, chest tightness, shortness of breath and wheezing.   Cardiovascular: Negative.  Negative for chest pain, palpitations and leg swelling.  Gastrointestinal: Negative.  Negative for abdominal pain, diarrhea, nausea and vomiting.  Genitourinary: Negative.   Musculoskeletal: Positive for arthralgias, back pain and myalgias.  Skin: Negative.  Negative for color change and rash.  Neurological: Negative.  Negative for  weakness, numbness and headaches.  Psychiatric/Behavioral: Negative.     Allergies as of 12/01/2018   No Known Allergies     Medication List       Accurate as of December 01, 2018 11:59 PM. Always use your most recent med list.        allopurinol 100 MG tablet Commonly known as:  ZYLOPRIM TAKE 1 TABLET 2 TO 3 TIMES A DAY FOR GOUT   aspirin 81 MG tablet Take 81 mg by mouth daily.   atorvastatin 20 MG tablet Commonly known as:  LIPITOR TAKE 1 TABLET DAILY   gabapentin 100 MG capsule Commonly known as:  NEURONTIN Take 1-3 capsules (100-300 mg total) by mouth at bedtime.   hydrALAZINE 50 MG tablet Commonly known as:  APRESOLINE Take 1 tablet (50 mg total) by mouth 3 (three) times daily.   hydrochlorothiazide 25 MG tablet Commonly known as:  HYDRODIURIL TAKE 1 OR 2 TABLETS DAILY IN THE MORNING AS NEEDED   loratadine 10 MG tablet Commonly known as:  CLARITIN Take 1 tablet (10 mg total) by mouth daily.   metFORMIN 500 MG 24 hr tablet Commonly known as:  GLUCOPHAGE-XR 2 tablet with evening meal Once a day   metoprolol 200 MG 24 hr tablet Commonly known as:  TOPROL-XL Take 1 tablet (200 mg total) by mouth daily.   pioglitazone 30 MG tablet Commonly known as:  ACTOS Take 1 tablet (30  mg total) by mouth daily.   polyethylene glycol packet Commonly known as:  MIRALAX / GLYCOLAX Take 17 g by mouth daily.   trandolapril 4 MG tablet Commonly known as:  MAVIK Take 1 tablet (4 mg total) by mouth daily.   verapamil 240 MG CR tablet Commonly known as:  CALAN-SR Take 2 tablets (480 mg total) by mouth daily.          Objective:    BP (!) 132/56   Pulse (!) 52   Temp (!) 97 F (36.1 C) (Oral)   Ht 5\' 9"  (1.753 m)   Wt 224 lb 6.4 oz (101.8 kg)   BMI 33.14 kg/m   No Known Allergies  Wt Readings from Last 3 Encounters:  12/01/18 224 lb 6.4 oz (101.8 kg)  10/27/18 225 lb 3.2 oz (102.2 kg)  09/30/18 217 lb 9.5 oz (98.7 kg)    Physical Exam Vitals signs and  nursing note reviewed.  Constitutional:      General: He is not in acute distress.    Appearance: He is well-developed.  HENT:     Head: Normocephalic and atraumatic.  Eyes:     Conjunctiva/sclera: Conjunctivae normal.     Pupils: Pupils are equal, round, and reactive to light.  Cardiovascular:     Rate and Rhythm: Normal rate and regular rhythm.     Heart sounds: Normal heart sounds.  Pulmonary:     Effort: Pulmonary effort is normal. No respiratory distress.     Breath sounds: Normal breath sounds.  Skin:    General: Skin is warm and dry.  Psychiatric:        Behavior: Behavior normal.         Assessment & Plan:   1. Back pain of lumbar region with sciatica Continue stretching and PT if needed  2. Spinal stenosis of lumbosacral region Continue stretching and PT if needed  3. Well adult exam - PSA; Future   Continue all other maintenance medications as listed above.  Follow up plan: Return in about 3 months (around 03/11/2019) for recheck.  Educational handout given for [place] PA-C [place] [address]  [place], [place] [address] [phone]   12/02/2018, 9:00 PM

## 2018-12-23 ENCOUNTER — Encounter: Payer: Self-pay | Admitting: Internal Medicine

## 2019-01-27 ENCOUNTER — Other Ambulatory Visit: Payer: Self-pay | Admitting: Physician Assistant

## 2019-02-24 ENCOUNTER — Encounter (INDEPENDENT_AMBULATORY_CARE_PROVIDER_SITE_OTHER): Payer: Self-pay

## 2019-02-24 ENCOUNTER — Other Ambulatory Visit: Payer: Self-pay

## 2019-02-25 ENCOUNTER — Encounter: Payer: Self-pay | Admitting: Physician Assistant

## 2019-02-25 ENCOUNTER — Ambulatory Visit (INDEPENDENT_AMBULATORY_CARE_PROVIDER_SITE_OTHER): Payer: Medicare Other | Admitting: Physician Assistant

## 2019-02-25 VITALS — BP 154/56 | HR 57 | Temp 99.0°F | Ht 69.0 in | Wt 229.0 lb

## 2019-02-25 DIAGNOSIS — I10 Essential (primary) hypertension: Secondary | ICD-10-CM

## 2019-02-25 DIAGNOSIS — M4807 Spinal stenosis, lumbosacral region: Secondary | ICD-10-CM

## 2019-02-25 DIAGNOSIS — M79672 Pain in left foot: Secondary | ICD-10-CM | POA: Diagnosis not present

## 2019-02-25 DIAGNOSIS — E119 Type 2 diabetes mellitus without complications: Secondary | ICD-10-CM | POA: Diagnosis not present

## 2019-02-25 DIAGNOSIS — M7989 Other specified soft tissue disorders: Secondary | ICD-10-CM | POA: Diagnosis not present

## 2019-02-25 LAB — BAYER DCA HB A1C WAIVED: HB A1C (BAYER DCA - WAIVED): 6.2 % (ref <7.0)

## 2019-02-25 LAB — LIPID PANEL

## 2019-02-25 MED ORDER — ATORVASTATIN CALCIUM 20 MG PO TABS: 20.0000 mg | ORAL_TABLET | Freq: Every day | ORAL | 3 refills | Status: DC

## 2019-02-25 MED ORDER — NAPROXEN 500 MG PO TABS: 500.0000 mg | ORAL_TABLET | Freq: Two times a day (BID) | ORAL | 0 refills | Status: DC

## 2019-02-25 MED ORDER — ALLOPURINOL 100 MG PO TABS: ORAL_TABLET | ORAL | 3 refills | Status: DC

## 2019-02-26 LAB — CBC WITH DIFFERENTIAL/PLATELET
Basophils Absolute: 0 10*3/uL (ref 0.0–0.2)
Basos: 0 %
EOS (ABSOLUTE): 0.3 10*3/uL (ref 0.0–0.4)
Eos: 5 %
Hematocrit: 33.7 % — ABNORMAL LOW (ref 37.5–51.0)
Hemoglobin: 11.2 g/dL — ABNORMAL LOW (ref 13.0–17.7)
Immature Grans (Abs): 0 10*3/uL (ref 0.0–0.1)
Immature Granulocytes: 1 %
Lymphocytes Absolute: 1.6 10*3/uL (ref 0.7–3.1)
Lymphs: 25 %
MCH: 29.6 pg (ref 26.6–33.0)
MCHC: 33.2 g/dL (ref 31.5–35.7)
MCV: 89 fL (ref 79–97)
Monocytes Absolute: 0.7 10*3/uL (ref 0.1–0.9)
Monocytes: 11 %
Neutrophils Absolute: 3.7 10*3/uL (ref 1.4–7.0)
Neutrophils: 58 %
Platelets: 192 10*3/uL (ref 150–450)
RBC: 3.78 x10E6/uL — ABNORMAL LOW (ref 4.14–5.80)
RDW: 14.4 % (ref 11.6–15.4)
WBC: 6.3 10*3/uL (ref 3.4–10.8)

## 2019-02-26 LAB — CMP14+EGFR
ALT: 17 IU/L (ref 0–44)
AST: 18 IU/L (ref 0–40)
Albumin/Globulin Ratio: 1.7 (ref 1.2–2.2)
Albumin: 4.3 g/dL (ref 3.7–4.7)
Alkaline Phosphatase: 90 IU/L (ref 39–117)
BUN/Creatinine Ratio: 26 — ABNORMAL HIGH (ref 10–24)
BUN: 36 mg/dL — ABNORMAL HIGH (ref 8–27)
Bilirubin Total: 0.3 mg/dL (ref 0.0–1.2)
CO2: 20 mmol/L (ref 20–29)
Calcium: 9.7 mg/dL (ref 8.6–10.2)
Chloride: 103 mmol/L (ref 96–106)
Creatinine, Ser: 1.41 mg/dL — ABNORMAL HIGH (ref 0.76–1.27)
GFR calc Af Amer: 56 mL/min/{1.73_m2} — ABNORMAL LOW (ref >59)
GFR calc non Af Amer: 49 mL/min/{1.73_m2} — ABNORMAL LOW (ref >59)
Globulin, Total: 2.6 g/dL (ref 1.5–4.5)
Glucose: 129 mg/dL — ABNORMAL HIGH (ref 65–99)
Potassium: 4.8 mmol/L (ref 3.5–5.2)
Sodium: 141 mmol/L (ref 134–144)
Total Protein: 6.9 g/dL (ref 6.0–8.5)

## 2019-02-26 LAB — LIPID PANEL
Chol/HDL Ratio: 2.1 ratio (ref 0.0–5.0)
Cholesterol, Total: 156 mg/dL (ref 100–199)
HDL: 76 mg/dL (ref >39)
LDL Calculated: 66 mg/dL (ref 0–99)
Triglycerides: 68 mg/dL (ref 0–149)
VLDL Cholesterol Cal: 14 mg/dL (ref 5–40)

## 2019-02-26 LAB — MICROALBUMIN / CREATININE URINE RATIO
Creatinine, Urine: 53.5 mg/dL
Microalb/Creat Ratio: 73 mg/g creat — ABNORMAL HIGH (ref 0–29)
Microalbumin, Urine: 38.8 ug/mL

## 2019-02-26 LAB — TSH: TSH: 3.89 u[IU]/mL (ref 0.450–4.500)

## 2019-03-02 ENCOUNTER — Encounter: Payer: Self-pay | Admitting: Physician Assistant

## 2019-03-02 ENCOUNTER — Other Ambulatory Visit: Payer: Self-pay | Admitting: Physician Assistant

## 2019-03-02 DIAGNOSIS — E1122 Type 2 diabetes mellitus with diabetic chronic kidney disease: Secondary | ICD-10-CM

## 2019-03-02 DIAGNOSIS — R809 Proteinuria, unspecified: Secondary | ICD-10-CM

## 2019-03-02 NOTE — Progress Notes (Signed)
BP (!) 154/56   Pulse (!) 57   Temp 99 F (37.2 C) (Oral)   Ht 5' 9" (1.753 m)   Wt 229 lb (103.9 kg)   BMI 33.82 kg/m    Subjective:    Patient ID: Connor Bryant, male    DOB: 01/02/1946, 73 y.o.   MRN: [id number]  HPI: Connor Bryant is a 73 y.o. male presenting on 02/25/2019 for Diabetes (6 month follow up )    Past Medical History:  Diagnosis Date  . Arthritis   . DM type 2 (diabetes mellitus, type 2) (HCC)    no meds  . Gallstone   . Heart murmur    was told one time he had a murmur, not since 30 years ago  . HTN (hypertension)   . Hyperlipidemia   . Personal history of colonic polyp-adenoma 11/24/2013  . Renal insufficiency    mild  . Thoracic spine fracture [facility])    Relevant past medical, surgical, family and social history reviewed and updated as indicated. Interim medical history since our last visit reviewed. Allergies and medications reviewed and updated. DATA REVIEWED: CHART IN EPIC  Family History reviewed for pertinent findings.  Review of Systems  Allergies as of 02/25/2019   No Known Allergies     Medication List       Accurate as of February 25, 2019 11:59 PM. If you have any questions, ask your nurse or doctor.        allopurinol 100 MG tablet Commonly known as:  ZYLOPRIM TAKE 1 TABLET 2 TO 3 TIMES A DAY FOR GOUT   aspirin 81 MG tablet Take 81 mg by mouth daily.   atorvastatin 20 MG tablet Commonly known as:  LIPITOR Take 1 tablet (20 mg total) by mouth daily.   gabapentin 100 MG capsule Commonly known as:  NEURONTIN Take 1-3 capsules (100-300 mg total) by mouth at bedtime.   hydrALAZINE 50 MG tablet Commonly known as:  APRESOLINE Take 1 tablet (50 mg total) by mouth 3 (three) times daily.   hydrochlorothiazide 25 MG tablet Commonly known as:  HYDRODIURIL TAKE 1 OR 2 TABLETS DAILY IN THE MORNING AS NEEDED   loratadine 10 MG tablet Commonly known as:  CLARITIN Take 1 tablet (10 mg total) by mouth daily.   metFORMIN 500 MG 24 hr  tablet Commonly known as:  GLUCOPHAGE-XR 2 tablet with evening meal Once a day   metoprolol 200 MG 24 hr tablet Commonly known as:  TOPROL-XL Take 1 tablet (200 mg total) by mouth daily.   naproxen 500 MG tablet Commonly known as:  Naprosyn Take 1 tablet (500 mg total) by mouth 2 (two) times daily with a meal. Started by:  Terald Sleeper, PA-C   pioglitazone 30 MG tablet Commonly known as:  ACTOS Take 1 tablet (30 mg total) by mouth daily.   polyethylene glycol 17 g packet Commonly known as:  MIRALAX / GLYCOLAX Take 17 g by mouth daily.   trandolapril 4 MG tablet Commonly known as:  MAVIK Take 1 tablet (4 mg total) by mouth daily.   verapamil 240 MG CR tablet Commonly known as:  CALAN-SR Take 2 tablets (480 mg total) by mouth daily.          Objective:    BP (!) 154/56   Pulse (!) 57   Temp 99 F (37.2 C) (Oral)   Ht 5' 9" (1.753 m)   Wt 229 lb (103.9 kg)   BMI 33.82 kg/m  No Known Allergies  Wt Readings from Last 3 Encounters:  02/25/19 229 lb (103.9 kg)  12/01/18 224 lb 6.4 oz (101.8 kg)  10/27/18 225 lb 3.2 oz (102.2 kg)    Physical Exam  Results for orders placed or performed in visit on 02/25/19  CBC with Differential/Platelet  Result Value Ref Range   WBC 6.3 3.4 - 10.8 x10E3/uL   RBC 3.78 (L) 4.14 - 5.80 x10E6/uL   Hemoglobin 11.2 (L) 13.0 - 17.7 g/dL   Hematocrit 33.7 (L) 37.5 - 51.0 %   MCV 89 79 - 97 fL   MCH 29.6 26.6 - 33.0 pg   MCHC 33.2 31.5 - 35.7 g/dL   RDW 14.4 11.6 - 15.4 %   Platelets 192 150 - 450 x10E3/uL   Neutrophils 58 Not Estab. %   Lymphs 25 Not Estab. %   Monocytes 11 Not Estab. %   Eos 5 Not Estab. %   Basos 0 Not Estab. %   Neutrophils Absolute 3.7 1.4 - 7.0 x10E3/uL   Lymphocytes Absolute 1.6 0.7 - 3.1 x10E3/uL   Monocytes Absolute 0.7 0.1 - 0.9 x10E3/uL   EOS (ABSOLUTE) 0.3 0.0 - 0.4 x10E3/uL   Basophils Absolute 0.0 0.0 - 0.2 x10E3/uL   Immature Granulocytes 1 Not Estab. %   Immature Grans (Abs) 0.0 0.0 - 0.1  x10E3/uL  CMP14+EGFR  Result Value Ref Range   Glucose 129 (H) 65 - 99 mg/dL   BUN 36 (H) 8 - 27 mg/dL   Creatinine, Ser 1.41 (H) 0.76 - 1.27 mg/dL   GFR calc non Af Amer 49 (L) >59 mL/min/1.73   GFR calc Af Amer 56 (L) >59 mL/min/1.73   BUN/Creatinine Ratio 26 (H) 10 - 24   Sodium 141 134 - 144 mmol/L   Potassium 4.8 3.5 - 5.2 mmol/L   Chloride 103 96 - 106 mmol/L   CO2 20 20 - 29 mmol/L   Calcium 9.7 8.6 - 10.2 mg/dL   Total Protein 6.9 6.0 - 8.5 g/dL   Albumin 4.3 3.7 - 4.7 g/dL   Globulin, Total 2.6 1.5 - 4.5 g/dL   Albumin/Globulin Ratio 1.7 1.2 - 2.2   Bilirubin Total 0.3 0.0 - 1.2 mg/dL   Alkaline Phosphatase 90 39 - 117 IU/L   AST 18 0 - 40 IU/L   ALT 17 0 - 44 IU/L  Lipid panel  Result Value Ref Range   Cholesterol, Total 156 100 - 199 mg/dL   Triglycerides 68 0 - 149 mg/dL   HDL 76 >39 mg/dL   VLDL Cholesterol Cal 14 5 - 40 mg/dL   LDL Calculated 66 0 - 99 mg/dL   Chol/HDL Ratio 2.1 0.0 - 5.0 ratio  TSH  Result Value Ref Range   TSH 3.890 0.450 - 4.500 uIU/mL  Bayer DCA Hb A1c Waived  Result Value Ref Range   HB A1C (BAYER DCA - WAIVED) 6.2 <7.0 %  Microalbumin / creatinine urine ratio  Result Value Ref Range   Creatinine, Urine 53.5 Not Estab. mg/dL   Microalbumin, Urine 38.8 Not Estab. ug/mL   Microalb/Creat Ratio 73 (H) 0 - 29 mg/g creat      Assessment & Plan:   1. Left foot pain - Ambulatory referral to Vascular Surgery  2. Swelling of left foot - Ambulatory referral to Vascular Surgery  3. Spinal stenosis of lumbosacral region - naproxen (NAPROSYN) 500 MG tablet; Take 1 tablet (500 mg total) by mouth 2 (two) times daily with a meal.  Dispense:  40 tablet; Refill: 0  4. Essential hypertension - CBC with Differential/Platelet - CMP14+EGFR - Lipid panel - TSH - Bayer DCA Hb A1c Waived - Microalbumin / creatinine urine ratio  5. Type 2 diabetes mellitus without complication, without long-term current use of insulin (HCC) - CBC with  Differential/Platelet - CMP14+EGFR - Lipid panel - TSH - Bayer DCA Hb A1c Waived - Microalbumin / creatinine urine ratio    Continue all other maintenance medications as listed above.  Follow up plan: Return in about 6 months (around 08/27/2019) for recheck.  Educational handout given for [place] PA-C [place] [address]  [place], [place] [address] [phone]   03/02/2019, 5:34 PM

## 2019-03-13 ENCOUNTER — Telehealth: Payer: Self-pay | Admitting: Physician Assistant

## 2019-03-16 ENCOUNTER — Other Ambulatory Visit: Payer: Self-pay

## 2019-03-16 ENCOUNTER — Encounter: Payer: Self-pay | Admitting: Family

## 2019-03-16 ENCOUNTER — Ambulatory Visit (INDEPENDENT_AMBULATORY_CARE_PROVIDER_SITE_OTHER): Payer: Medicare Other | Admitting: Family

## 2019-03-16 VITALS — BP 141/66 | HR 60 | Temp 97.6°F | Ht 69.0 in | Wt 227.2 lb

## 2019-03-16 DIAGNOSIS — I739 Peripheral vascular disease, unspecified: Secondary | ICD-10-CM

## 2019-03-16 NOTE — Telephone Encounter (Signed)
Lm to call back for appt - jhb 03/16/19.

## 2019-03-16 NOTE — Patient Instructions (Signed)
Intermittent Claudication Intermittent claudication is pain in one or both legs that occurs when walking or exercising and goes away when resting. Intermittent claudication is a symptom of peripheral arterial disease (PAD). This condition is commonly treated with rest, medicine, and healthy lifestyle changes. If medical management does not improve symptoms, surgery can be done to restore blood flow (revascularization) to the affected leg. What are the causes?  This condition is caused by buildup of fatty material (plaque) within the major arteries in the body (atherosclerosis). Plaque makes arteries stiff and narrow, which prevents enough blood from reaching the leg muscles. Pain occurs when you walk or exercise because your muscles need (but cannot get) more blood when you are moving and exercising. What increases the risk? The following factors may make you more likely to develop this condition:  A family history of atherosclerosis.  A personal history of stroke or heart disease.  Older age.  Being inactive (sedentary lifestyle).  Being overweight.  Smoking cigarettes.  Having another health condition such as: ? Diabetes. ? High blood pressure. ? High cholesterol. What are the signs or symptoms? Symptoms of this condition may first develop in the lower leg, and then they may spread to the thigh, hip, buttock, or the back of the lower leg (calf) over time. Symptoms may include:  Aches or pains.  Cramps.  A feeling of tightness, weakness, or heaviness.  A wound on the lower leg or foot that heals poorly or does not heal. How is this diagnosed? This condition may be diagnosed based on:  Your symptoms.  Your medical history.  Tests, such as: ? Blood tests. ? Arterial duplex ultrasound. This test uses images of blood vessels and surrounding organs to evaluate blood flow within arteries. ? Angiogram. In this procedure, dye is injected into arteries and then X-rays are  taken. ? Magnetic resonance angiogram (MRA). In this procedure, strong magnets and radio waves are used instead of X-rays to create images of blood vessels and blood flow. ? CT angiogram (CTA). In this procedure, a large X-ray machine called a CT scanner takes detailed pictures of blood vessels that have been injected with dye. ? Ankle-brachial index (ABI) test. This procedure measures blood pressure in the leg during exercise and at rest. ? Exercise test. For this test, you will walk on a treadmill while tests are done (such as the ABI test) to evaluate how this condition affects your ability to walk or exercise. How is this treated? Treatment for this condition may involve treatment for the underlying cause, such as treatment for high blood pressure, high cholesterol, or diabetes. Treatment may include:  Lifestyle changes such as: ? Starting a supervised or home-based exercise program. ? Losing weight. ? Quitting smoking.  Medicines to help restore blood flow through your legs.  Blood vessel surgery (angioplasty) to restore blood flow around the blocked vessel. This is also known as endovascular therapy (EVT). This is only done if your intermittent claudication is caused by severe peripheral artery disease, a condition in which blood flow is severely or totally restricted by the narrowing of the arteries. Follow these instructions at home: Lifestyle   Maintain a healthy weight.  Eat a diet that is low in saturated fats and calories. Consider working with a diet and nutrition specialist (dietitian) to help you make healthy food choices.  Do not use any products that contain nicotine or tobacco, such as cigarettes and e-cigarettes. If you need help quitting, ask your health care provider.  If   your health care provider recommended an exercise program for you, follow it as directed. Your exercise program may involve: ? Walking 3 or more times a week. ? Walking until you have certain  symptoms of intermittent claudication. ? Resting until symptoms go away. ? Gradually increasing your walking time to about 50 minutes a day. General instructions  Work with your health care provider to manage any other health conditions you may have, including diabetes, high blood pressure, or high cholesterol.  Take over-the-counter and prescription medicines only as told by your health care provider.  Keep all follow-up visits as told by your health care provider. This is important. Contact a health care provider if:  Your pain does not go away with rest.  You have sores on your legs that do not heal or have a bad smell or pus coming from them.  Your condition gets worse or does not get better with treatment. Get help right away if:  You have chest pain.  You have difficulty breathing.  You develop arm weakness.  You have trouble speaking.  Your face begins to droop.  Your foot or leg is cold or it changes color.  Your foot or leg becomes numb. These symptoms may represent a serious problem that is an emergency. Do not wait to see if the symptoms will go away. Get medical help right away. Call your local emergency services (911 in the [place] Do not drive yourself to the hospital.  Summary  Intermittent claudication is pain in one or both legs that occurs when walking or exercising and goes away when resting.  This condition is caused by buildup of fatty material (plaque) within the major arteries in the body (atherosclerosis). Plaque makes arteries stiff and narrow, which prevents enough blood from reaching the leg muscles.  Intermittent claudication can be treated with medicine and lifestyle changes. If medical treatment fails, surgery can be done to help return blood flow to the affected area.  Make sure you work with your health care provider to manage any other health conditions you may have, including diabetes, high blood pressure, or high cholesterol. This  information is not intended to replace advice given to you by your health care provider. Make sure you discuss any questions you have with your health care provider. Document Released: 07/13/2004 Document Revised: 10/11/2016 Document Reviewed: 10/11/2016 Elsevier Interactive Patient Education  2019 Elsevier Inc.  

## 2019-03-16 NOTE — Progress Notes (Signed)
Subjective:    Patient ID: Connor Bryant, male    DOB: [birth year], 73 y.o.   MRN: [id number]  Chief Complaint  Patient presents with  . pain in both legs    wants referral to Dr. Donnetta Bryant, vein specialist    HPI PT presents to the office today with bilateral leg pain that is worse when he walks. He states the pain improves when he sits. He states he woke up last Wednesday with right thigh and calf pain of a 10 out 10 that lasted 20 mins and then resolved. He states he has leg cramps, but this pain was more intense.   He has a hx of PVD. He quit smoking 10 years. He states he has compression hose, but can not put them on.  He is seen at the [place] annually. He is requesting referral to Vascular. States it has been 3 years since he has been seen.  His A1C was 6.2 on 02/25/19.  Review of Systems  Cardiovascular: Positive for leg swelling.  All other systems reviewed and are negative.      Objective:   Physical Exam Vitals signs reviewed.  Constitutional:      General: He is not in acute distress.    Appearance: He is well-developed.  HENT:     Head: Normocephalic.  Eyes:     General:        Right eye: No discharge.        Left eye: No discharge.     Pupils: Pupils are equal, round, and reactive to light.  Neck:     Musculoskeletal: Normal range of motion and neck supple.     Thyroid: No thyromegaly.  Cardiovascular:     Rate and Rhythm: Normal rate and regular rhythm.     Heart sounds: Murmur present.  Pulmonary:     Effort: Pulmonary effort is normal. No respiratory distress.     Breath sounds: Normal breath sounds. No wheezing.  Abdominal:     General: Bowel sounds are normal. There is no distension.     Palpations: Abdomen is soft.     Tenderness: There is no abdominal tenderness.  Musculoskeletal:        General: No tenderness.     Right lower leg: Edema (trace ) present.     Left lower leg: Edema (trace) present.  Skin:    General: Skin is warm and dry.   Findings: No erythema or rash.  Neurological:     Mental Status: He is alert and oriented to person, place, and time.     Cranial Nerves: No cranial nerve deficit.     Deep Tendon Reflexes: Reflexes are normal and symmetric.  Psychiatric:        Behavior: Behavior normal.        Thought Content: Thought content normal.        Judgment: Judgment normal.       BP (!) 142/51   Pulse (!) 54   Temp 97.6 F (36.4 C) (Oral)   Ht 5\' 9"  (1.753 m)   Wt 227 lb 3.2 oz (103.1 kg)   BMI 33.55 kg/m      Assessment & Plan:  Connor Bryant comes in today with chief complaint of pain in both legs (wants referral to Dr. Donnetta Bryant, vein specialist)   Diagnosis and orders addressed:  1. Intermittent claudication ([place]) - Ambulatory referral to Vascular Surgery  2. PVD (peripheral vascular disease) ([place]) - Ambulatory referral to Vascular Surgery  3.  Peripheral vascular disease ([place]) - Ambulatory referral to Vascular Surgery  Encouraged healthy diet and weight loss Start wearing your compression hose everyday Rest Encouraged scheduled exercise  Continue good control of DM Keep follow up with PCP  Evelina Dun, FNP

## 2019-05-25 ENCOUNTER — Other Ambulatory Visit: Payer: Self-pay

## 2019-05-25 DIAGNOSIS — I739 Peripheral vascular disease, unspecified: Secondary | ICD-10-CM

## 2019-06-02 ENCOUNTER — Ambulatory Visit (INDEPENDENT_AMBULATORY_CARE_PROVIDER_SITE_OTHER)
Admission: RE | Admit: 2019-06-02 | Discharge: 2019-06-02 | Disposition: A | Discharge status: Home / Self Care | Payer: Medicare Other | Source: Ambulatory Visit | Attending: Vascular Surgery | Admitting: Vascular Surgery

## 2019-06-02 ENCOUNTER — Encounter: Payer: Self-pay | Admitting: Vascular Surgery

## 2019-06-02 ENCOUNTER — Other Ambulatory Visit: Payer: Self-pay

## 2019-06-02 ENCOUNTER — Ambulatory Visit: Payer: Medicare Other | Admitting: Vascular Surgery

## 2019-06-02 ENCOUNTER — Ambulatory Visit (HOSPITAL_COMMUNITY)
Admission: RE | Admit: 2019-06-02 | Discharge: 2019-06-02 | Disposition: A | Discharge status: Home / Self Care | Payer: Medicare Other | Source: Ambulatory Visit | Attending: Vascular Surgery | Admitting: Vascular Surgery

## 2019-06-02 VITALS — BP 170/62 | HR 53 | Temp 97.5°F | Resp 20 | Ht 69.0 in | Wt 227.6 lb

## 2019-06-02 DIAGNOSIS — I739 Peripheral vascular disease, unspecified: Secondary | ICD-10-CM | POA: Diagnosis not present

## 2019-06-02 NOTE — Progress Notes (Signed)
Vascular and Vein Specialist of [place]  Patient name: Connor Bryant MRN: [id number] DOB: 06-07-1946 Sex: male  REASON FOR VISIT: Evaluation bilateral intermittent claudication  HPI: Connor Bryant is a 73 y.o. male here today for discussion of intermittent claudication.  He is well-known to our practice from prior aorto to mesenteric bypass with Dr. Kellie Simmering in 2015.  He had had severe mesenteric ischemia with extensive weight loss and abdominal pain.  He had excellent result and is returned to weight of 103 kg.  He did have a episode of partial small bowel obstruction on 09/30/2018 and underwent CT scan at that time at [facility] which I have for review.  He did not require surgery and has spontaneous decompression of his partial small bowel obstruction.  He does report classic intermittent claudication from aortoiliac occlusive disease.  He reports that if he walks around the department store or and a [place] store that he has fatigue in both legs which is resolved with resting.  This does limit his ability but he is able to tolerated.  He can do his required daily activities.  Does not have any arterial rest pain and does not have any lower extremity tissue loss.  Past Medical History:  Diagnosis Date  . Arthritis   . DM type 2 (diabetes mellitus, type 2) (HCC)    no meds  . Gallstone   . Heart murmur    was told one time he had a murmur, not since 30 years ago  . HTN (hypertension)   . Hyperlipidemia   . Personal history of colonic polyp-adenoma 11/24/2013  . Renal insufficiency    mild  . Thoracic spine fracture [facility])     Family History  Problem Relation Age of Onset  . Hypertension Father   . Coronary artery disease Father   . Hyperlipidemia Father   . Diabetes Father   . Bladder Cancer Father   . Cancer Father   . Heart disease Father   . Heart attack Father   . Diabetes Mother   . Heart disease Mother        before age 38  .  Hyperlipidemia Mother   . Hypertension Mother   . AAA (abdominal aortic aneurysm) Mother   . Colon cancer Neg Hx   . Throat cancer Neg Hx   . Kidney disease Neg Hx   . Liver disease Neg Hx     SOCIAL HISTORY: Social History   Tobacco Use  . Smoking status: Former Smoker    Types: Cigarettes    Quit date: 07/08/2009    Years since quitting: 9.9  . Smokeless tobacco: Former Systems developer    Types: Chew  . Tobacco comment: Pt just tried  for short time  Substance Use Topics  . Alcohol use: Yes    Alcohol/week: 12.0 standard drinks    Types: 12 Cans of beer per week    Comment: 8 beers a week    No Known Allergies  Current Outpatient Medications  Medication Sig Dispense Refill  . allopurinol (ZYLOPRIM) 100 MG tablet TAKE 1 TABLET 2 TO 3 TIMES A DAY FOR GOUT 90 tablet 3  . aspirin 81 MG tablet Take 81 mg by mouth daily.    Marland Kitchen atorvastatin (LIPITOR) 20 MG tablet Take 1 tablet (20 mg total) by mouth daily. 90 tablet 3  . gabapentin (NEURONTIN) 100 MG capsule Take 1-3 capsules (100-300 mg total) by mouth at bedtime. 90 capsule 3  . hydrALAZINE (APRESOLINE)  50 MG tablet Take 1 tablet (50 mg total) by mouth 3 (three) times daily. 270 tablet 3  . hydrochlorothiazide (HYDRODIURIL) 25 MG tablet TAKE 1 OR 2 TABLETS DAILY IN THE MORNING AS NEEDED 180 tablet 3  . loratadine (CLARITIN) 10 MG tablet Take 1 tablet (10 mg total) by mouth daily. 90 tablet 3  . metFORMIN (GLUCOPHAGE-XR) 500 MG 24 hr tablet 2 tablet with evening meal Once a day 180 tablet 3  . metoprolol (TOPROL-XL) 200 MG 24 hr tablet Take 1 tablet (200 mg total) by mouth daily. 90 tablet 3  . pioglitazone (ACTOS) 30 MG tablet Take 1 tablet (30 mg total) by mouth daily. 90 tablet 3  . polyethylene glycol (MIRALAX / GLYCOLAX) packet Take 17 g by mouth daily. 30 packet 1  . trandolapril (MAVIK) 4 MG tablet Take 1 tablet (4 mg total) by mouth daily. 90 tablet 3  . verapamil (CALAN-SR) 240 MG CR tablet Take 2 tablets (480 mg total) by mouth  daily. 180 tablet 3   No current facility-administered medications for this visit.     REVIEW OF SYSTEMS:  [X]  denotes positive finding, [ ]  denotes negative finding Cardiac  Comments:  Chest pain or chest pressure:    Shortness of breath upon exertion:    Short of breath when lying flat:    Irregular heart rhythm:        Vascular    Pain in calf, thigh, or hip brought on by ambulation: x   Pain in feet at night that wakes you up from your sleep:     Blood clot in your veins:    Leg swelling:  x         PHYSICAL EXAM: Vitals:   06/02/19 0917  BP: (!) 170/62  Pulse: (!) 53  Resp: 20  Temp: (!) 97.5 F (36.4 C)  SpO2: 98%  Weight: 103.2 kg  Height: 5\' 9"  (1.753 m)    GENERAL: The patient is a well-nourished male, in no acute distress. The vital signs are documented above. CARDIOVASCULAR: Carotid arteries without bruits bilaterally.  2+ radial pulses bilaterally.  I do not hear an abdominal bruit he does have obesity.  He has a 1+ right dorsalis pedis and 1+ left posterior tibial pulse PULMONARY: There is good air exchange  MUSCULOSKELETAL: There are no major deformities or cyanosis. NEUROLOGIC: No focal weakness or paresthesias are detected. SKIN: There are no ulcers or rashes noted. PSYCHIATRIC: The patient has a normal affect.  DATA:  Noninvasive studies reveal ankle arm index of 0.82 on the right and 0.78 on the left  Mesenteric duplex today suggest potential stenosis at the proximal anastomosis of his D[place] aorta to SMA bypass  CT scan from 09/30/2018 was reviewed.  This reveals widely patent bypass.  He has extensive calcification and narrowing in his iliac arteries both in the common and external iliac arteries.  MEDICAL ISSUES: Had a long discussion with the patient regarding this.  He clearly has had a complete resolution of his mesenteric ischemia and is being back weight.  He will continue to monitor regarding potential small bowel obstruction and present  immediately to the hospital should this occur.  He does not like his intermittent claudication but is able to tolerate it.  I did explain that the next potential evaluation would be arteriography.  He has mild renal insufficiency with a creatinine of 1.4 on 02/25/2019.  He does have very diffuse iliac disease making endovascular treatment more difficult.  He is  comfortable with observation only at this time and knows to notify us should he develop worsening symptoms or tissue loss.    Rosetta Posner, MD FACS [facility] Tel [phone] Pager [phone]

## 2019-08-25 ENCOUNTER — Telehealth: Payer: Self-pay | Admitting: Physician Assistant

## 2019-08-25 NOTE — Chronic Care Management (AMB) (Signed)
°  Chronic Care Management   Outreach Note  08/25/2019 Name: Connor Bryant MRN: [id number] DOB: [birth year]  Referred by: Terald Sleeper, PA-C Reason for referral : Chronic Care Management (Initial CCM outreach was unsuccessful. )   An unsuccessful telephone outreach was attempted today. The patient was referred to the case management team by for assistance with care management and care coordination.   Follow Up Plan: A HIPPA compliant phone message was left for the patient providing contact information and requesting a return call.  The care management team will reach out to the patient again over the next 7 days.  If patient returns call to provider office, please advise to call [place] at [place], [place] [facility]  [place], [place] [address] Direct Dial: [email or web address]  [email or web address]: [email or web address]

## 2019-08-26 ENCOUNTER — Other Ambulatory Visit: Payer: Self-pay

## 2019-08-27 ENCOUNTER — Ambulatory Visit (INDEPENDENT_AMBULATORY_CARE_PROVIDER_SITE_OTHER): Payer: Medicare Other | Admitting: Physician Assistant

## 2019-08-27 ENCOUNTER — Encounter: Payer: Self-pay | Admitting: Physician Assistant

## 2019-08-27 VITALS — BP 166/60 | HR 61 | Temp 97.8°F | Ht 69.0 in | Wt 233.4 lb

## 2019-08-27 DIAGNOSIS — M5106 Intervertebral disc disorders with myelopathy, lumbar region: Secondary | ICD-10-CM

## 2019-08-27 DIAGNOSIS — I10 Essential (primary) hypertension: Secondary | ICD-10-CM | POA: Diagnosis not present

## 2019-08-27 DIAGNOSIS — E119 Type 2 diabetes mellitus without complications: Secondary | ICD-10-CM

## 2019-08-27 DIAGNOSIS — E1122 Type 2 diabetes mellitus with diabetic chronic kidney disease: Secondary | ICD-10-CM | POA: Diagnosis not present

## 2019-08-27 DIAGNOSIS — Z23 Encounter for immunization: Secondary | ICD-10-CM

## 2019-08-27 LAB — BAYER DCA HB A1C WAIVED: HB A1C (BAYER DCA - WAIVED): 6.3 % (ref <7.0)

## 2019-08-27 MED ORDER — METFORMIN HCL ER 500 MG PO TB24: ORAL_TABLET | ORAL | 3 refills | Status: DC

## 2019-08-27 MED ORDER — METOPROLOL SUCCINATE ER 200 MG PO TB24: 200.0000 mg | ORAL_TABLET | Freq: Every day | ORAL | 3 refills | Status: DC

## 2019-08-27 MED ORDER — TRANDOLAPRIL 4 MG PO TABS: 4.0000 mg | ORAL_TABLET | Freq: Every day | ORAL | 3 refills | Status: DC

## 2019-08-27 MED ORDER — HYDRALAZINE HCL 50 MG PO TABS: 50.0000 mg | ORAL_TABLET | Freq: Three times a day (TID) | ORAL | 3 refills | Status: DC

## 2019-08-27 MED ORDER — VERAPAMIL HCL ER 240 MG PO TBCR: 480.0000 mg | EXTENDED_RELEASE_TABLET | Freq: Every day | ORAL | 3 refills | Status: DC

## 2019-08-27 MED ORDER — LORATADINE 10 MG PO TABS: 10.0000 mg | ORAL_TABLET | Freq: Every day | ORAL | 3 refills | Status: DC

## 2019-08-27 MED ORDER — PIOGLITAZONE HCL 30 MG PO TABS: 30.0000 mg | ORAL_TABLET | Freq: Every day | ORAL | 3 refills | Status: DC

## 2019-08-27 MED ORDER — HYDROCHLOROTHIAZIDE 25 MG PO TABS: ORAL_TABLET | ORAL | 3 refills | Status: DC

## 2019-08-28 NOTE — Progress Notes (Signed)
BP (!) 166/60   Pulse 61   Temp 97.8 F (36.6 C) (Temporal)   Ht 5\' 9"  (1.753 m)   Wt 233 lb 6.4 oz (105.9 kg)   SpO2 97%   BMI 34.47 kg/m    Subjective:    Patient ID: Connor Bryant, male    DOB: 1946/06/03, 73 y.o.   MRN: [id number]  HPI: Connor Bryant is a 73 y.o. male presenting on 08/27/2019 for Medical Management of Chronic Issues (6 mth )  The patient comes in for follow-up on his chronic medical conditions.  They do include well-controlled type 2 diabetes, hypertension, elevated renal functions are being followed, known peripheral vascular disease followed by cardiovascular.  He did have recent issues with claudication and did see vascular.  They are watching and waiting and planning to do some type of intervention in coming months.  He reports that he has not had any difficulties with his sugars being extremely high or low blood.  He has been tolerating meds well.  His blood pressure readings at home have been very good and controlled for him usually in the 130s over 80s.  And finally he has had a worsening of his back pain.  It has become a daily occurrence that as he is up through the day his entire back from the thoracic region down is having more more pain.  If he does not get things on the morning he is not able to accomplish very much.  Past Medical History:  Diagnosis Date  . Arthritis   . DM type 2 (diabetes mellitus, type 2) (HCC)    no meds  . Gallstone   . Heart murmur    was told one time he had a murmur, not since 30 years ago  . HTN (hypertension)   . Hyperlipidemia   . Personal history of colonic polyp-adenoma 11/24/2013  . Renal insufficiency    mild  . Thoracic spine fracture [facility])    Relevant past medical, surgical, family and social history reviewed and updated as indicated. Interim medical history since our last visit reviewed. Allergies and medications reviewed and updated. DATA REVIEWED: CHART IN EPIC  Family History reviewed for pertinent  findings.  Review of Systems  Constitutional: Negative.  Negative for appetite change and fatigue.  HENT: Negative.   Eyes: Negative.  Negative for pain and visual disturbance.  Respiratory: Negative.  Negative for cough, chest tightness, shortness of breath and wheezing.   Cardiovascular: Negative.  Negative for chest pain, palpitations and leg swelling.  Gastrointestinal: Negative.  Negative for abdominal pain, diarrhea, nausea and vomiting.  Endocrine: Negative.   Genitourinary: Negative.   Musculoskeletal: Positive for arthralgias and back pain.  Skin: Negative.  Negative for color change and rash.  Neurological: Positive for weakness. Negative for numbness and headaches.  Psychiatric/Behavioral: Negative.     Allergies as of 08/27/2019   No Known Allergies     Medication List       Accurate as of August 27, 2019 11:59 PM. If you have any questions, ask your nurse or doctor.        STOP taking these medications   gabapentin 100 MG capsule Commonly known as: NEURONTIN Stopped by: Terald Sleeper, PA-C     TAKE these medications   allopurinol 100 MG tablet Commonly known as: ZYLOPRIM TAKE 1 TABLET 2 TO 3 TIMES A DAY FOR GOUT   aspirin 81 MG tablet Take 81 mg by mouth daily.   atorvastatin 20 MG  tablet Commonly known as: LIPITOR Take 1 tablet (20 mg total) by mouth daily.   hydrALAZINE 50 MG tablet Commonly known as: APRESOLINE Take 1 tablet (50 mg total) by mouth 3 (three) times daily.   hydrochlorothiazide 25 MG tablet Commonly known as: HYDRODIURIL TAKE 1 OR 2 TABLETS DAILY IN THE MORNING AS NEEDED   loratadine 10 MG tablet Commonly known as: CLARITIN Take 1 tablet (10 mg total) by mouth daily.   metFORMIN 500 MG 24 hr tablet Commonly known as: GLUCOPHAGE-XR 2 tablet with evening meal Once a day   metoprolol 200 MG 24 hr tablet Commonly known as: TOPROL-XL Take 1 tablet (200 mg total) by mouth daily.   pioglitazone 30 MG tablet Commonly known as:  ACTOS Take 1 tablet (30 mg total) by mouth daily.   polyethylene glycol 17 g packet Commonly known as: MIRALAX / GLYCOLAX Take 17 g by mouth daily.   trandolapril 4 MG tablet Commonly known as: MAVIK Take 1 tablet (4 mg total) by mouth daily.   verapamil 240 MG CR tablet Commonly known as: CALAN-SR Take 2 tablets (480 mg total) by mouth daily.          Objective:    BP (!) 166/60   Pulse 61   Temp 97.8 F (36.6 C) (Temporal)   Ht 5\' 9"  (1.753 m)   Wt 233 lb 6.4 oz (105.9 kg)   SpO2 97%   BMI 34.47 kg/m   No Known Allergies  Wt Readings from Last 3 Encounters:  08/27/19 233 lb 6.4 oz (105.9 kg)  06/02/19 227 lb 9.6 oz (103.2 kg)  03/16/19 227 lb 3.2 oz (103.1 kg)    Physical Exam Vitals signs and nursing note reviewed.  Constitutional:      General: He is not in acute distress.    Appearance: He is well-developed.  HENT:     Head: Normocephalic and atraumatic.  Eyes:     Conjunctiva/sclera: Conjunctivae normal.     Pupils: Pupils are equal, round, and reactive to light.  Cardiovascular:     Rate and Rhythm: Normal rate and regular rhythm.     Heart sounds: Normal heart sounds.  Pulmonary:     Effort: Pulmonary effort is normal. No respiratory distress.     Breath sounds: Normal breath sounds.  Musculoskeletal:     Thoracic back: He exhibits decreased range of motion, tenderness and pain.     Lumbar back: He exhibits decreased range of motion, pain and spasm.  Skin:    General: Skin is warm and dry.  Psychiatric:        Behavior: Behavior normal.     Results for orders placed or performed in visit on 08/27/19  hgba1c  Result Value Ref Range   HB A1C (BAYER DCA - WAIVED) 6.3 <7.0 %      Assessment & Plan:   1. Type 2 diabetes mellitus with chronic kidney disease, without long-term current use of insulin, unspecified CKD stage ([place]) - hgba1c - Diabetes foot exam  2. Essential hypertension - hydrALAZINE (APRESOLINE) 50 MG tablet; Take 1 tablet  (50 mg total) by mouth 3 (three) times daily.  Dispense: 270 tablet; Refill: 3 - metoprolol (TOPROL-XL) 200 MG 24 hr tablet; Take 1 tablet (200 mg total) by mouth daily.  Dispense: 90 tablet; Refill: 3 - trandolapril (MAVIK) 4 MG tablet; Take 1 tablet (4 mg total) by mouth daily.  Dispense: 90 tablet; Refill: 3 - verapamil (CALAN-SR) 240 MG CR tablet; Take 2 tablets (480  mg total) by mouth daily.  Dispense: 180 tablet; Refill: 3  3. Type 2 diabetes mellitus without complication, without long-term current use of insulin (HCC) - pioglitazone (ACTOS) 30 MG tablet; Take 1 tablet (30 mg total) by mouth daily.  Dispense: 90 tablet; Refill: 3  4. Need for immunization against influenza - Flu Vaccine QUAD High Dose(Fluad)  5. Intervertebral lumbar disc disorder with myelopathy, lumbar region Plan for orthopedic referral in new year   Continue all other maintenance medications as listed above.  Follow up plan: Return in about 6 months (around 02/25/2020) for recheck and labs.  Educational handout given for [place] PA-C [place] [address]  [place], [place] [address] [phone]   08/28/2019, 3:32 AM

## 2019-09-01 NOTE — Chronic Care Management (AMB) (Signed)
Chronic Care Management   Note  09/01/2019 Name: Connor Bryant MRN: [id number] DOB: 1946/07/13  Connor Bryant is a 73 y.o. year old male who is a primary care patient of Terald Sleeper, PA-C. I reached out to Connor Bryant by phone today in response to a referral sent by Connor Bryant health plan.     [name] was given information about Chronic Care Management services today including:  1. CCM service includes personalized support from designated clinical staff supervised by his physician, including individualized plan of care and coordination with other care providers 2. 24/7 contact phone numbers for assistance for urgent and routine care needs. 3. Service will only be billed when office clinical staff spend 20 minutes or more in a month to coordinate care. 4. Only one practitioner may furnish and bill the service in a calendar month. 5. The patient may stop CCM services at any time (effective at the end of the month) by phone call to the office staff. 6. The patient will be responsible for cost sharing (co-pay) of up to 20% of the service fee (after annual deductible is met).  Patient did not agree to enrollment in care management services and does not wish to consider at this time.  Follow up plan: The patient has been provided with contact information for the chronic care management team and has been advised to call with any health related questions or concerns.   [place], [place] [address] Direct Dial: [email or web address]  Website: [email or web address]

## 2019-09-29 DIAGNOSIS — M48061 Spinal stenosis, lumbar region without neurogenic claudication: Secondary | ICD-10-CM | POA: Diagnosis not present

## 2019-09-29 DIAGNOSIS — M545 Low back pain: Secondary | ICD-10-CM | POA: Diagnosis not present

## 2019-10-01 ENCOUNTER — Other Ambulatory Visit: Payer: Self-pay

## 2019-10-01 ENCOUNTER — Encounter: Payer: Self-pay | Admitting: Physical Therapy

## 2019-10-01 ENCOUNTER — Ambulatory Visit: Payer: Medicare Other | Attending: Orthopedic Surgery | Admitting: Physical Therapy

## 2019-10-01 DIAGNOSIS — M545 Low back pain: Secondary | ICD-10-CM | POA: Diagnosis not present

## 2019-10-01 DIAGNOSIS — G8929 Other chronic pain: Secondary | ICD-10-CM | POA: Insufficient documentation

## 2019-10-01 DIAGNOSIS — M6281 Muscle weakness (generalized): Secondary | ICD-10-CM | POA: Diagnosis not present

## 2019-10-01 NOTE — Therapy (Signed)
[facility] [place], [place], [address] Phone: [phone]   Fax:  [phone]  Physical Therapy Evaluation  Patient Details  Name: Connor Bryant MRN: [id number] Date of Birth: 06/11/1946 Referring Provider (PT): Melina Schools, MD   Encounter Date: 10/01/2019  PT End of Session - 10/01/19 1937    Visit Number  1    Number of Visits  12    Date for PT Re-Evaluation  11/05/19    Authorization Type  FOTO; progress note every10th visit; KX modifier at 15th visit    PT Start Time  1430    PT Stop Time  1515    PT Time Calculation (min)  45 min    Activity Tolerance  Patient tolerated treatment well    Behavior During Therapy  [facility] for tasks assessed/performed       Past Medical History:  Diagnosis Date  . Arthritis   . DM type 2 (diabetes mellitus, type 2) (HCC)    no meds  . Gallstone   . Heart murmur    was told one time he had a murmur, not since 30 years ago  . HTN (hypertension)   . Hyperlipidemia   . Personal history of colonic polyp-adenoma 11/24/2013  . Renal insufficiency    mild  . Thoracic spine fracture [facility])     Past Surgical History:  Procedure Laterality Date  . ABDOMINAL AORTAGRAM N/A 02/23/2014   Procedure: ABDOMINAL [name];  Surgeon: Conrad Novato, MD;  Location: [facility] CATH LAB;  Service: Cardiovascular;  Laterality: N/A;  . COLONOSCOPY    . DEBRIDEMENT TENNIS ELBOW Right   . ESOPHAGOGASTRODUODENOSCOPY    . MESENTERIC ARTERY BYPASS N/A 03/10/2014   Procedure: AORTO TO SUPERIOR MESENTERIC ARTERY BYPASS;  Surgeon: [name], MD;  Location: [place];  Service: Vascular;  Laterality: N/A;  . TONSILLECTOMY AND ADENOIDECTOMY      There were no vitals filed for this visit.   Subjective Assessment - 10/01/19 1929    Subjective  COVID-19 screening performed upon arrival.Patient arrives to physical therapy with reports of low back pain that increases with standing time. Patient reports ongoing  progressive pain that started about 2 years ago. Patient reports pain limits his ability to perform various ADLs. Patient reports pain at worst as 10/10 and pain at best as 0/10 with sitting or leaning forward with his elbows on a table or countertop. Patient denies numbness and tingling in the low back. Patient's goals are to decrease pain, improve movement, and improve standing tolerance to cook.    Pertinent History  HTN, DM, tremors    Limitations  Standing;House hold activities    Diagnostic tests  MRI: see imaging tab    Patient Stated Goals  decrease pain    Currently in Pain?  Yes    Pain Location  Back    Pain Orientation  Lower    Pain Descriptors / Indicators  Aching    Pain Type  Chronic pain    Pain Onset  More than a month ago    Pain Frequency  Constant    Aggravating Factors   standing    Pain Relieving Factors  sitting         [facility] PT Assessment - 10/01/19 0001      Assessment   Medical Diagnosis  low back pain    Referring Provider (PT)  Melina Schools, MD    Onset Date/Surgical Date  --   ongoing ~2 years  Next MD Visit  to be made    Prior Therapy  no      Precautions   Precautions  None      Restrictions   Weight Bearing Restrictions  No      Balance Screen   Has the patient fallen in the past 6 months  No    Has the patient had a decrease in activity level because of a fear of falling?   No    Is the patient reluctant to leave their home because of a fear of falling?   No      Home Film/video editor residence      Prior Function   Level of Independence  Independent      Observation/Other Assessments   Focus on Therapeutic Outcomes (FOTO)   53% limited      Posture/Postural Control   Posture/Postural Control  Postural limitations    Postural Limitations  Rounded Shoulders;Flexed trunk;Forward head      ROM / Strength   AROM / PROM / Strength  Strength      Strength   Overall Strength  Deficits    Strength  Assessment Site  Hip;Knee    Right/Left Hip  Right;Left    Right Hip Flexion  4-/5    Right Hip Extension  3+/5    Right Hip ABduction  3+/5    Left Hip Flexion  4-/5    Left Hip ABduction  3+/5    Right/Left Knee  Left;Right    Right Knee Flexion  4/5    Right Knee Extension  4/5    Left Knee Flexion  4/5    Left Knee Extension  4/5      Ambulation/Gait   Gait Pattern  Step-through pattern;Decreased stride length;Trunk flexed                Objective measurements completed on examination: See above findings.              PT Education - 10/01/19 2058    Education Details  draw in, double knee to chest, single knee to chest, supine marching, hip abduction    Person(s) Educated  Patient    Methods  Explanation;Handout    Comprehension  Verbalized understanding;Returned demonstration          PT Long Term Goals - 10/01/19 2106      PT LONG TERM GOAL #1   Title  Patient will be independent with HEP    Time  4    Period  Weeks    Status  New      PT LONG TERM GOAL #2   Title  Patient will report ability to stand for 20 minutes or greater to improve ability to perform home tasks.    Time  4    Period  Weeks    Status  New      PT LONG TERM GOAL #3   Title  Patient will report ability to perform ADLs and home tasks with low back pain less than or equal to 3/10    Time  4    Period  Weeks    Status  New             Plan - 10/01/19 2103    Clinical Impression Statement  Patient is a 74 year old male who presents to physical therapy with chronic low back pain, decreased bilateral LE MMT and abnormal posture. Patient denied tenderness to palpation to  low back and glute musculature. Patient noted with a flexed trunk in standing and during gait. Patient and PT discussed HEP as well as plan of care to address deficits and goals. Patient reported understanding. Patient would benefit from skilled physical therapy to address deficits and goals.     Personal Factors and Comorbidities  Comorbidity 2    Comorbidities  HTN, DM, tremors    Examination-Activity Limitations  Stand    Stability/Clinical Decision Making  Stable/Uncomplicated    Clinical Decision Making  Low    Rehab Potential  Good    PT Frequency  3x / week    PT Duration  4 weeks    PT Treatment/Interventions  Cryotherapy;Electrical Stimulation;ADLs/Self Care Home Management;Iontophoresis 4mg /ml Dexamethasone;Moist Heat;Traction;Ultrasound;Therapeutic exercise;Therapeutic activities;Functional mobility training;Stair training;Gait training;Balance training;Neuromuscular re-education;Manual techniques;Dry needling;Patient/family education;Vasopneumatic Device;Taping    PT Next Visit Plan  nustep,  core stabilization flexion based exercises, modalities PRN for pain relief    PT Home Exercise Plan  see patient education section    Consulted and Agree with Plan of Care  Patient       Patient will benefit from skilled therapeutic intervention in order to improve the following deficits and impairments:  Decreased activity tolerance, Decreased strength, Decreased range of motion, Difficulty walking, Abnormal gait, Pain, Improper body mechanics, Postural dysfunction  Visit Diagnosis: Chronic bilateral low back pain, unspecified whether sciatica present - Plan: PT plan of care cert/re-cert  Muscle weakness (generalized) - Plan: PT plan of care cert/re-cert     Problem List Patient Active Problem List   Diagnosis Date Noted  . Compression fracture of T12 vertebra ([place]) 10/27/2018  . Spinal stenosis of lumbosacral region 10/27/2018  . Small bowel obstruction ([place]) 09/30/2018  . Back pain of lumbar region with sciatica 08/26/2018  . Chronic left-sided low back pain with left-sided sciatica 08/26/2018  . Tremor 08/26/2018  . Pure hypercholesterolemia 02/25/2018  . Atherosclerosis of aorta ([place]) 02/24/2018  . Bilateral hip pain 06/24/2017  . Body mass index 30.0-30.9, adult  06/06/2016  . Gout 06/06/2016  . Essential hypertension 06/06/2016  . Type 2 diabetes mellitus without complication, without long-term current use of insulin ([place]) 06/06/2016  . Chronic mesenteric ischemia ([place]) 03/01/2014  . Peripheral vascular disease ([place]) 02/16/2014  . 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 Personal history of colonic polyp-adenoma 11/24/2013  . Diarrhea 10/21/2013    Gabriela Eves, PT, DPT 10/01/2019, 9:11 PM  [facility] [address] [place], [place], [address] Phone: [phone]   Fax:  [phone]  Name: Connor Bryant MRN: [id number] Date of Birth: 1945-12-24

## 2019-10-06 ENCOUNTER — Ambulatory Visit: Payer: Medicare Other | Admitting: Physical Therapy

## 2019-10-08 ENCOUNTER — Encounter: Payer: Medicare Other | Admitting: Physical Therapy

## 2019-10-13 ENCOUNTER — Other Ambulatory Visit: Payer: Self-pay

## 2019-10-13 ENCOUNTER — Ambulatory Visit: Payer: Medicare Other | Admitting: Physical Therapy

## 2019-10-13 DIAGNOSIS — M545 Low back pain: Secondary | ICD-10-CM | POA: Diagnosis not present

## 2019-10-13 DIAGNOSIS — G8929 Other chronic pain: Secondary | ICD-10-CM | POA: Diagnosis not present

## 2019-10-13 DIAGNOSIS — M6281 Muscle weakness (generalized): Secondary | ICD-10-CM

## 2019-10-13 NOTE — Therapy (Signed)
[facility] [place], [place], [address] Phone: [phone]   Fax:  [phone]  Physical Therapy Treatment  Patient Details  Name: Connor Bryant MRN: [id number] Date of Birth: 1946-02-09 Referring Provider (PT): Melina Schools, MD   Encounter Date: 10/13/2019  PT End of Session - 10/13/19 1452    Visit Number  2    Number of Visits  12    Date for PT Re-Evaluation  11/05/19    Authorization Type  FOTO; progress note every10th visit; KX modifier at 15th visit    PT Start Time  1346    PT Stop Time  1434    PT Time Calculation (min)  48 min    Activity Tolerance  Patient tolerated treatment well    Behavior During Therapy  [facility] for tasks assessed/performed       Past Medical History:  Diagnosis Date  . Arthritis   . DM type 2 (diabetes mellitus, type 2) (HCC)    no meds  . Gallstone   . Heart murmur    was told one time he had a murmur, not since 30 years ago  . HTN (hypertension)   . Hyperlipidemia   . Personal history of colonic polyp-adenoma 11/24/2013  . Renal insufficiency    mild  . Thoracic spine fracture [facility])     Past Surgical History:  Procedure Laterality Date  . ABDOMINAL AORTAGRAM N/A 02/23/2014   Procedure: ABDOMINAL [name];  Surgeon: Conrad , MD;  Location: [facility] CATH LAB;  Service: Cardiovascular;  Laterality: N/A;  . COLONOSCOPY    . DEBRIDEMENT TENNIS ELBOW Right   . ESOPHAGOGASTRODUODENOSCOPY    . MESENTERIC ARTERY BYPASS N/A 03/10/2014   Procedure: AORTO TO SUPERIOR MESENTERIC ARTERY BYPASS;  Surgeon: [name], MD;  Location: [place];  Service: Vascular;  Laterality: N/A;  . TONSILLECTOMY AND ADENOIDECTOMY      There were no vitals filed for this visit.  Subjective Assessment - 10/13/19 1403    Subjective  COVID-19 screening performed upon arrival. Reports feeling about the same. Reports he hasn't done his HEP due to a sinus infection.    Pertinent History  HTN, DM, tremors    Limitations  Standing;House hold activities    Diagnostic tests  MRI: see imaging tab    Patient Stated Goals  decrease pain    Currently in Pain?  No/denies    Pain Location  --    Pain Orientation  --    Pain Descriptors / Indicators  --    Pain Type  --    Pain Onset  --    Pain Frequency  --         [facility] PT Assessment - 10/13/19 0001      Assessment   Medical Diagnosis  low back pain    Referring Provider (PT)  Melina Schools, MD    Next MD Visit  to be made    Prior Therapy  no      Precautions   Precautions  None                   [facility] Adult PT Treatment/Exercise - 10/13/19 0001      Exercises   Exercises  Lumbar      Lumbar Exercises: Stretches   Single Knee to Chest Stretch  Right;Left;Other (comment);2 reps   15 seconds   Double Knee to Chest Stretch  3 reps;Other (comment)   15 seconds   Double Knee to  Chest Stretch Limitations  using gait belt to assist      Lumbar Exercises: Aerobic   Nustep  Level 3 x10 mins      Lumbar Exercises: Standing   Heel Raises  --    Other Standing Lumbar Exercises  rockerboard x2 minutes      Lumbar Exercises: Supine   Ab Set  15 reps;5 seconds    Clam  15 reps;3 seconds    Other Supine Lumbar Exercises  Ball squeeze 3" x15      Modalities   Modalities  Electrical Stimulation;Moist Heat      Moist Heat Therapy   Number Minutes Moist Heat  15 Minutes    Moist Heat Location  Lumbar Spine      Electrical Stimulation   Electrical Stimulation Location  lumbar paraspinals    Electrical Stimulation Action  pre-mod    Electrical Stimulation Parameters  80-150 hz x15 mins    Electrical Stimulation Goals  Pain                  PT Long Term Goals - 10/01/19 2106      PT LONG TERM GOAL #1   Title  Patient will be independent with HEP    Time  4    Period  Weeks    Status  New      PT LONG TERM GOAL #2   Title  Patient will report ability to stand for 20 minutes or greater to improve ability  to perform home tasks.    Time  4    Period  Weeks    Status  New      PT LONG TERM GOAL #3   Title  Patient will report ability to perform ADLs and home tasks with low back pain less than or equal to 3/10    Time  4    Period  Weeks    Status  New            Plan - 10/13/19 1430    Clinical Impression Statement  Patient was able to tolerate treatment fairly well but reported fatigue in bilateral LEs. Patient guided through core strengthening exercises as well as stretches. Normal response upon removal of modalities.    Personal Factors and Comorbidities  Comorbidity 2    Comorbidities  HTN, DM, tremors    Examination-Activity Limitations  Stand    Stability/Clinical Decision Making  Stable/Uncomplicated    Clinical Decision Making  Low    Rehab Potential  Good    PT Frequency  3x / week    PT Duration  4 weeks    PT Treatment/Interventions  Cryotherapy;Electrical Stimulation;ADLs/Self Care Home Management;Iontophoresis 4mg /ml Dexamethasone;Moist Heat;Traction;Ultrasound;Therapeutic exercise;Therapeutic activities;Functional mobility training;Stair training;Gait training;Balance training;Neuromuscular re-education;Manual techniques;Dry needling;Patient/family education;Vasopneumatic Device;Taping    PT Next Visit Plan  nustep,  core stabilization flexion based exercises, modalities PRN for pain relief    PT Home Exercise Plan  see patient education section    Consulted and Agree with Plan of Care  Patient       Patient will benefit from skilled therapeutic intervention in order to improve the following deficits and impairments:  Decreased activity tolerance, Decreased strength, Decreased range of motion, Difficulty walking, Abnormal gait, Pain, Improper body mechanics, Postural dysfunction  Visit Diagnosis: Chronic bilateral low back pain, unspecified whether sciatica present  Muscle weakness (generalized)     Problem List Patient Active Problem List   Diagnosis Date  Noted  . Compression fracture of T12 vertebra (  [place]) 10/27/2018  . Spinal stenosis of lumbosacral region 10/27/2018  . Small bowel obstruction ([place]) 09/30/2018  . Back pain of lumbar region with sciatica 08/26/2018  . Chronic left-sided low back pain with left-sided sciatica 08/26/2018  . Tremor 08/26/2018  . Pure hypercholesterolemia 02/25/2018  . Atherosclerosis of aorta ([place]) 02/24/2018  . Bilateral hip pain 06/24/2017  . Body mass index 30.0-30.9, adult 06/06/2016  . Gout 06/06/2016  . Essential hypertension 06/06/2016  . Type 2 diabetes mellitus without complication, without long-term current use of insulin ([place]) 06/06/2016  . Chronic mesenteric ischemia ([place]) 03/01/2014  . Peripheral vascular disease ([place]) 02/16/2014  . 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 Personal history of colonic polyp-adenoma 11/24/2013  . Diarrhea 10/21/2013    Gabriela Eves, PT, DPT 10/13/2019, 2:52 PM  [facility] [address] [place], [place], [address] Phone: [phone]   Fax:  [phone]  Name: JOSHUWA MESSEX MRN: [id number] Date of Birth: 03-18-1946

## 2019-10-15 ENCOUNTER — Encounter: Payer: Self-pay | Admitting: Physical Therapy

## 2019-10-15 ENCOUNTER — Ambulatory Visit: Payer: Medicare Other | Admitting: Physical Therapy

## 2019-10-15 ENCOUNTER — Other Ambulatory Visit: Payer: Self-pay

## 2019-10-15 DIAGNOSIS — M6281 Muscle weakness (generalized): Secondary | ICD-10-CM | POA: Diagnosis not present

## 2019-10-15 DIAGNOSIS — M545 Low back pain: Secondary | ICD-10-CM | POA: Diagnosis not present

## 2019-10-15 DIAGNOSIS — G8929 Other chronic pain: Secondary | ICD-10-CM

## 2019-10-15 NOTE — Therapy (Signed)
[facility] [place], [place], [address] Phone: [phone]   Fax:  [phone]  Physical Therapy Treatment  Patient Details  Name: Connor Bryant MRN: [id number] Date of Birth: 27-Apr-1946 Referring Provider (PT): Melina Schools, MD   Encounter Date: 10/15/2019  PT End of Session - 10/15/19 1417    Visit Number  3    Number of Visits  12    Date for PT Re-Evaluation  11/05/19    Authorization Type  FOTO; progress note every10th visit; KX modifier at 15th visit    PT Start Time  0144    PT Stop Time  0228    PT Time Calculation (min)  44 min    Activity Tolerance  Patient tolerated treatment well    Behavior During Therapy  [facility] for tasks assessed/performed       Past Medical History:  Diagnosis Date  . Arthritis   . DM type 2 (diabetes mellitus, type 2) (HCC)    no meds  . Gallstone   . Heart murmur    was told one time he had a murmur, not since 30 years ago  . HTN (hypertension)   . Hyperlipidemia   . Personal history of colonic polyp-adenoma 11/24/2013  . Renal insufficiency    mild  . Thoracic spine fracture [facility])     Past Surgical History:  Procedure Laterality Date  . ABDOMINAL AORTAGRAM N/A 02/23/2014   Procedure: ABDOMINAL [name];  Surgeon: Conrad [place], MD;  Location: [facility] CATH LAB;  Service: Cardiovascular;  Laterality: N/A;  . COLONOSCOPY    . DEBRIDEMENT TENNIS ELBOW Right   . ESOPHAGOGASTRODUODENOSCOPY    . MESENTERIC ARTERY BYPASS N/A 03/10/2014   Procedure: AORTO TO SUPERIOR MESENTERIC ARTERY BYPASS;  Surgeon: [name], MD;  Location: [place];  Service: Vascular;  Laterality: N/A;  . TONSILLECTOMY AND ADENOIDECTOMY      There were no vitals filed for this visit.  Subjective Assessment - 10/15/19 1347    Subjective  COVID-19 screening performed upon arrival. Patient arrived with no pain, yet pain in the morning while standing making breakfast.    Pertinent History  HTN, DM, tremors    Limitations  Standing;House hold activities    Diagnostic tests  MRI: see imaging tab    Patient Stated Goals  decrease pain    Currently in Pain?  No/denies                       [facility] Adult PT Treatment/Exercise - 10/15/19 0001      Lumbar Exercises: Aerobic   Nustep  Level 3 x10 mins (seat 10) UE/LE activity      Lumbar Exercises: Standing   Other Standing Lumbar Exercises  rockerboard x2 minutes    Other Standing Lumbar Exercises  blue XTS ext pull x20      Lumbar Exercises: Supine   Ab Set  10 reps;3 seconds    Glut Set  10 reps;3 seconds    Bent Knee Raise  3 seconds   2x10   Straight Leg Raise  3 seconds   2x10   Straight Leg Raises Limitations  left LE only       Moist Heat Therapy   Number Minutes Moist Heat  15 Minutes    Moist Heat Location  Lumbar Spine      Electrical Stimulation   Electrical Stimulation Location  lumbar paraspinals    Electrical Stimulation Action  premod  Electrical Stimulation Parameters  80-150hz  x79min    Electrical Stimulation Goals  Pain             PT Education - 10/15/19 1421    Education Details  Posture awareness techniques for ADL's, lifting and bending    Person(s) Educated  Patient    Methods  Explanation;Demonstration    Comprehension  Verbalized understanding          PT Long Term Goals - 10/15/19 1351      PT LONG TERM GOAL #1   Title  Patient will be independent with HEP    Time  4    Period  Weeks    Status  On-going      PT LONG TERM GOAL #2   Title  Patient will report ability to stand for 20 minutes or greater to improve ability to perform home tasks.    Time  4    Period  Weeks    Status  On-going      PT LONG TERM GOAL #3   Title  Patient will report ability to perform ADLs and home tasks with low back pain less than or equal to 3/10    Time  4    Period  Weeks    Status  On-going            Plan - 10/15/19 1418    Clinical Impression Statement  Patient tolerated  treatment well today. Patient able to progress with core exerciss yet limited with SLR with Right LE due to discomfort, only able to perform with left today. Patient reported he does all the cooking, cleaning and yard work which causes him increased discomfort. Patient was educated on posture awareness techniques today to help avoid wrong movements, with ADL's, lifting and bending. Current goals ongoing.    Personal Factors and Comorbidities  Comorbidity 2    Comorbidities  HTN, DM, tremors    Examination-Activity Limitations  Stand    Stability/Clinical Decision Making  Stable/Uncomplicated    Rehab Potential  Good    PT Frequency  3x / week    PT Duration  4 weeks    PT Treatment/Interventions  Cryotherapy;Electrical Stimulation;ADLs/Self Care Home Management;Iontophoresis 4mg /ml Dexamethasone;Moist Heat;Traction;Ultrasound;Therapeutic exercise;Therapeutic activities;Functional mobility training;Stair training;Gait training;Balance training;Neuromuscular re-education;Manual techniques;Dry needling;Patient/family education;Vasopneumatic Device;Taping    PT Next Visit Plan  cont with POC for nustep,  core stabilization flexion based exercises, modalities PRN for pain relief    Consulted and Agree with Plan of Care  Patient       Patient will benefit from skilled therapeutic intervention in order to improve the following deficits and impairments:  Decreased activity tolerance, Decreased strength, Decreased range of motion, Difficulty walking, Abnormal gait, Pain, Improper body mechanics, Postural dysfunction  Visit Diagnosis: Chronic bilateral low back pain, unspecified whether sciatica present  Muscle weakness (generalized)     Problem List Patient Active Problem List   Diagnosis Date Noted  . Compression fracture of T12 vertebra ([place]) 10/27/2018  . Spinal stenosis of lumbosacral region 10/27/2018  . Small bowel obstruction ([place]) 09/30/2018  . Back pain of lumbar region with sciatica  08/26/2018  . Chronic left-sided low back pain with left-sided sciatica 08/26/2018  . Tremor 08/26/2018  . Pure hypercholesterolemia 02/25/2018  . Atherosclerosis of aorta ([place]) 02/24/2018  . Bilateral hip pain 06/24/2017  . Body mass index 30.0-30.9, adult 06/06/2016  . Gout 06/06/2016  . Essential hypertension 06/06/2016  . Type 2 diabetes mellitus without complication, without long-term current use of  insulin ([place]) 06/06/2016  . Chronic mesenteric ischemia ([place]) 03/01/2014  . Peripheral vascular disease ([place]) 02/16/2014  . 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 Personal history of colonic polyp-adenoma 11/24/2013  . Diarrhea 10/21/2013    Phillips Climes, PTA 10/15/2019, 2:31 PM  [facility] [place], [place], [address] Phone: [phone]   Fax:  [phone]  Name: GILLES HUSTEAD MRN: [id number] Date of Birth: 1946-01-18

## 2019-10-20 DIAGNOSIS — M5136 Other intervertebral disc degeneration, lumbar region: Secondary | ICD-10-CM | POA: Diagnosis not present

## 2019-10-21 DIAGNOSIS — N183 Chronic kidney disease, stage 3 unspecified: Secondary | ICD-10-CM | POA: Diagnosis not present

## 2019-10-21 DIAGNOSIS — I129 Hypertensive chronic kidney disease with stage 1 through stage 4 chronic kidney disease, or unspecified chronic kidney disease: Secondary | ICD-10-CM | POA: Diagnosis not present

## 2019-10-22 ENCOUNTER — Other Ambulatory Visit: Payer: Self-pay

## 2019-10-22 ENCOUNTER — Other Ambulatory Visit: Payer: Medicare Other

## 2019-10-22 DIAGNOSIS — N183 Chronic kidney disease, stage 3 unspecified: Secondary | ICD-10-CM | POA: Diagnosis not present

## 2019-10-27 ENCOUNTER — Ambulatory Visit: Payer: Medicare Other | Attending: Orthopedic Surgery | Admitting: Physical Therapy

## 2019-10-27 ENCOUNTER — Other Ambulatory Visit: Payer: Self-pay

## 2019-10-27 DIAGNOSIS — M545 Low back pain: Secondary | ICD-10-CM | POA: Insufficient documentation

## 2019-10-27 DIAGNOSIS — M6281 Muscle weakness (generalized): Secondary | ICD-10-CM | POA: Diagnosis not present

## 2019-10-27 DIAGNOSIS — G8929 Other chronic pain: Secondary | ICD-10-CM | POA: Diagnosis not present

## 2019-10-27 NOTE — Therapy (Signed)
[facility] [place], [place], [address] Phone: [phone]   Fax:  [phone]  Physical Therapy Treatment  Patient Details  Name: Connor Bryant MRN: [id number] Date of Birth: 1946-09-04 Referring Provider (PT): Melina Schools, MD   Encounter Date: 10/27/2019  PT End of Session - 10/27/19 1429    Visit Number  4    Number of Visits  12    Date for PT Re-Evaluation  11/05/19    Authorization Type  FOTO; progress note every10th visit; KX modifier at 15th visit    PT Start Time  1346    PT Stop Time  1438    PT Time Calculation (min)  52 min    Activity Tolerance  Patient tolerated treatment well    Behavior During Therapy  [facility] for tasks assessed/performed       Past Medical History:  Diagnosis Date  . Arthritis   . DM type 2 (diabetes mellitus, type 2) (HCC)    no meds  . Gallstone   . Heart murmur    was told one time he had a murmur, not since 30 years ago  . HTN (hypertension)   . Hyperlipidemia   . Personal history of colonic polyp-adenoma 11/24/2013  . Renal insufficiency    mild  . Thoracic spine fracture [facility])     Past Surgical History:  Procedure Laterality Date  . ABDOMINAL AORTAGRAM N/A 02/23/2014   Procedure: ABDOMINAL [name];  Surgeon: Conrad Iola, MD;  Location: [facility] CATH LAB;  Service: Cardiovascular;  Laterality: N/A;  . COLONOSCOPY    . DEBRIDEMENT TENNIS ELBOW Right   . ESOPHAGOGASTRODUODENOSCOPY    . MESENTERIC ARTERY BYPASS N/A 03/10/2014   Procedure: AORTO TO SUPERIOR MESENTERIC ARTERY BYPASS;  Surgeon: [name], MD;  Location: [place];  Service: Vascular;  Laterality: N/A;  . TONSILLECTOMY AND ADENOIDECTOMY      There were no vitals filed for this visit.  Subjective Assessment - 10/27/19 1356    Subjective  COVID-19 screening performed upon arrival. Patient arrived stating getting an injection that only helped or 2 days. Reports having trouble moving wood due to pain.    Pertinent  History  HTN, DM, tremors    Limitations  Standing;House hold activities    Diagnostic tests  MRI: see imaging tab    Patient Stated Goals  decrease pain         [facility] PT Assessment - 10/27/19 0001      Assessment   Medical Diagnosis  low back pain    Referring Provider (PT)  Melina Schools, MD    Next MD Visit  11/03/2019    Prior Therapy  no      Precautions   Precautions  None                   [facility] Adult PT Treatment/Exercise - 10/27/19 0001      Exercises   Exercises  Lumbar      Lumbar Exercises: Aerobic   Nustep  Level 3 x10 mins (seat 10) UE/LE activity      Modalities   Modalities  Electrical Stimulation;Moist Heat;Ultrasound      Moist Heat Therapy   Number Minutes Moist Heat  15 Minutes    Moist Heat Location  Lumbar Spine      Electrical Stimulation   Electrical Stimulation Location  lumbar paraspinals    Electrical Stimulation Action  pre-mod    Electrical Stimulation Parameters  80-150 hz  x64mins    Electrical Stimulation Goals  Pain      Ultrasound   Ultrasound Location  lumbar paraspinals bilateral    Ultrasound Parameters  combo 1.5 w/cm2, 1 mhz, 100% x12 mins    Ultrasound Goals  Pain             PT Education - 10/27/19 1442    Education Details  forward flexion based exercises, lifting techniques and body mechanics.    Person(s) Educated  Patient    Methods  Explanation;Demonstration    Comprehension  Verbalized understanding          PT Long Term Goals - 10/15/19 1351      PT LONG TERM GOAL #1   Title  Patient will be independent with HEP    Time  4    Period  Weeks    Status  On-going      PT LONG TERM GOAL #2   Title  Patient will report ability to stand for 20 minutes or greater to improve ability to perform home tasks.    Time  4    Period  Weeks    Status  On-going      PT LONG TERM GOAL #3   Title  Patient will report ability to perform ADLs and home tasks with low back pain less than or equal to 3/10     Time  4    Period  Weeks    Status  On-going            Plan - 10/27/19 1443    Clinical Impression Statement  Patient tolerated treatment fairly well but patient reported no significant changes since start of PT. Patient's session geared toward pain reduction via modalities. Patient reported a small area in the left thoracolumbar region that was more sensitive to combo than others but patient was able to tolerate the remaining time. Patient again educated on body mechanics for ADLs and home activities as he continues to have pain with standing activities. No adverse affects upon the removal of modalities.    Personal Factors and Comorbidities  Comorbidity 2    Comorbidities  HTN, DM, tremors    Examination-Activity Limitations  Stand    Stability/Clinical Decision Making  Stable/Uncomplicated    Clinical Decision Making  Low    Rehab Potential  Good    PT Frequency  3x / week    PT Duration  4 weeks    PT Treatment/Interventions  Cryotherapy;Electrical Stimulation;ADLs/Self Care Home Management;Iontophoresis 4mg /ml Dexamethasone;Moist Heat;Traction;Ultrasound;Therapeutic exercise;Therapeutic activities;Functional mobility training;Stair training;Gait training;Balance training;Neuromuscular re-education;Manual techniques;Dry needling;Patient/family education;Vasopneumatic Device;Taping    PT Next Visit Plan  cont with POC for nustep,  core stabilization flexion based exercises, modalities PRN for pain relief    PT Home Exercise Plan  see patient education section    Consulted and Agree with Plan of Care  Patient       Patient will benefit from skilled therapeutic intervention in order to improve the following deficits and impairments:  Decreased activity tolerance, Decreased strength, Decreased range of motion, Difficulty walking, Abnormal gait, Pain, Improper body mechanics, Postural dysfunction  Visit Diagnosis: Chronic bilateral low back pain, unspecified whether sciatica  present  Muscle weakness (generalized)     Problem List Patient Active Problem List   Diagnosis Date Noted  . Compression fracture of T12 vertebra ([place]) 10/27/2018  . Spinal stenosis of lumbosacral region 10/27/2018  . Small bowel obstruction ([place]) 09/30/2018  . Back pain of lumbar region with sciatica 08/26/2018  .  Chronic left-sided low back pain with left-sided sciatica 08/26/2018  . Tremor 08/26/2018  . Pure hypercholesterolemia 02/25/2018  . Atherosclerosis of aorta ([place]) 02/24/2018  . Bilateral hip pain 06/24/2017  . Body mass index 30.0-30.9, adult 06/06/2016  . Gout 06/06/2016  . Essential hypertension 06/06/2016  . Type 2 diabetes mellitus without complication, without long-term current use of insulin ([place]) 06/06/2016  . Chronic mesenteric ischemia ([place]) 03/01/2014  . Peripheral vascular disease ([place]) 02/16/2014  . 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 Personal history of colonic polyp-adenoma 11/24/2013  . Diarrhea 10/21/2013    Gabriela Eves, PT, DPT 10/27/2019, 2:49 PM  [facility] [address] [place], [place], [address] Phone: [phone]   Fax:  [phone]  Name: BENTLEE PACKETT MRN: [id number] Date of Birth: 08-Jul-1946

## 2019-10-29 ENCOUNTER — Encounter: Payer: Medicare Other | Admitting: Physical Therapy

## 2019-11-03 DIAGNOSIS — M545 Low back pain: Secondary | ICD-10-CM | POA: Diagnosis not present

## 2019-11-03 DIAGNOSIS — M5136 Other intervertebral disc degeneration, lumbar region: Secondary | ICD-10-CM | POA: Diagnosis not present

## 2019-11-03 DIAGNOSIS — M48061 Spinal stenosis, lumbar region without neurogenic claudication: Secondary | ICD-10-CM | POA: Diagnosis not present

## 2019-11-10 DIAGNOSIS — M545 Low back pain: Secondary | ICD-10-CM | POA: Diagnosis not present

## 2019-11-23 DIAGNOSIS — M5136 Other intervertebral disc degeneration, lumbar region: Secondary | ICD-10-CM | POA: Diagnosis not present

## 2019-12-08 ENCOUNTER — Other Ambulatory Visit: Payer: Self-pay

## 2019-12-08 ENCOUNTER — Ambulatory Visit: Payer: Medicare Other | Attending: Orthopedic Surgery | Admitting: Physical Therapy

## 2019-12-08 DIAGNOSIS — M6281 Muscle weakness (generalized): Secondary | ICD-10-CM | POA: Diagnosis not present

## 2019-12-08 DIAGNOSIS — G8929 Other chronic pain: Secondary | ICD-10-CM | POA: Diagnosis not present

## 2019-12-08 DIAGNOSIS — M545 Low back pain: Secondary | ICD-10-CM | POA: Insufficient documentation

## 2019-12-08 NOTE — Therapy (Signed)
[facility] [place], [place], [address] Phone: [phone]   Fax:  [phone]  Physical Therapy Treatment  Patient Details  Name: Connor Bryant MRN: [id number] Date of Birth: [birth year] Referring Provider (PT): Melina Schools, MD   Encounter Date: 12/08/2019  PT End of Session - 12/08/19 1352    Visit Number  5    Number of Visits  12    Date for PT Re-Evaluation  01/15/20   per new referral   Authorization Type  FOTO; progress note every10th visit; KX modifier at 15th visit    PT Start Time  1345    PT Stop Time  1433    PT Time Calculation (min)  48 min    Activity Tolerance  Patient tolerated treatment well    Behavior During Therapy  [facility] for tasks assessed/performed       Past Medical History:  Diagnosis Date  . Arthritis   . DM type 2 (diabetes mellitus, type 2) (HCC)    no meds  . Gallstone   . Heart murmur    was told one time he had a murmur, not since 30 years ago  . HTN (hypertension)   . Hyperlipidemia   . Personal history of colonic polyp-adenoma 11/24/2013  . Renal insufficiency    mild  . Thoracic spine fracture [facility])     Past Surgical History:  Procedure Laterality Date  . ABDOMINAL AORTAGRAM N/A 02/23/2014   Procedure: ABDOMINAL [name];  Surgeon: Conrad [place], MD;  Location: [facility] CATH LAB;  Service: Cardiovascular;  Laterality: N/A;  . COLONOSCOPY    . DEBRIDEMENT TENNIS ELBOW Right   . ESOPHAGOGASTRODUODENOSCOPY    . MESENTERIC ARTERY BYPASS N/A 03/10/2014   Procedure: AORTO TO SUPERIOR MESENTERIC ARTERY BYPASS;  Surgeon: [name], MD;  Location: [place];  Service: Vascular;  Laterality: N/A;  . TONSILLECTOMY AND ADENOIDECTOMY      There were no vitals filed for this visit.  Subjective Assessment - 12/08/19 1355    Subjective  COVID-19 screening performed upon arrival. Patient arrives with new referral to continue PT. Patient reported MRI showed normal age related changes.    Pertinent History  HTN, DM, tremors    Limitations  Standing;House hold activities    Diagnostic tests  MRI: see imaging tab    Patient Stated Goals  decrease pain         [facility] PT Assessment - 12/08/19 0001      Assessment   Medical Diagnosis  low back pain    Referring Provider (PT)  Melina Schools, MD    Next MD Visit  n/a      Precautions   Precautions  None                   [facility] Adult PT Treatment/Exercise - 12/08/19 0001      Exercises   Exercises  Lumbar      Lumbar Exercises: Stretches   Single Knee to Chest Stretch  Right;Left;10 seconds;4 reps    Figure 4 Stretch  --      Lumbar Exercises: Aerobic   Nustep  Level 4 x10 mins (seat 11) UE/LE activity      Lumbar Exercises: Supine   Ab Set  20 reps;5 seconds    Clam  20 reps;5 seconds    Bent Knee Raise  20 reps;3 seconds    Bridge with Ball Squeeze  10 reps;5 seconds      Modalities  Modalities  Electrical Stimulation;Moist Heat;Ultrasound      Moist Heat Therapy   Number Minutes Moist Heat  15 Minutes    Moist Heat Location  Lumbar Spine      Electrical Stimulation   Electrical Stimulation Location  lumbar paraspinals    Electrical Stimulation Action  IFC    Electrical Stimulation Parameters  80-150 hz x15 mins    Electrical Stimulation Goals  Pain                  PT Long Term Goals - 12/08/19 1423      PT LONG TERM GOAL #1   Title  Patient will be independent with HEP    Time  4    Period  Weeks    Status  On-going      PT LONG TERM GOAL #2   Title  Patient will report ability to stand for 20 minutes or greater to improve ability to perform home tasks.    Time  4    Period  Weeks    Status  On-going      PT LONG TERM GOAL #3   Title  Patient will report ability to perform ADLs and home tasks with low back pain less than or equal to 3/10    Time  4    Period  Weeks    Status  On-going            Plan - 12/08/19 1420    Clinical Impression Statement   Patient arrives to physical therapy after a hiatus secondary to scheduling conflicts and increased pain. Patient guided through low level exercises with no reports of increased pain. Patient and PT discussed importance of finding time to peform HEP as improvements are not only going to occur in physical therapy. Patient reported he is the only one at home to do all the home chores so he has no time.  Patient's goals are ongoing at this time. No adverse affects upon removal of modalities.    Personal Factors and Comorbidities  Comorbidity 2    Comorbidities  HTN, DM, tremors    Examination-Activity Limitations  Stand    Stability/Clinical Decision Making  Stable/Uncomplicated    Clinical Decision Making  Low    Rehab Potential  Good    PT Frequency  3x / week    PT Duration  4 weeks    PT Treatment/Interventions  Cryotherapy;Electrical Stimulation;ADLs/Self Care Home Management;Iontophoresis 4mg /ml Dexamethasone;Moist Heat;Traction;Ultrasound;Therapeutic exercise;Therapeutic activities;Functional mobility training;Stair training;Gait training;Balance training;Neuromuscular re-education;Manual techniques;Dry needling;Patient/family education;Vasopneumatic Device;Taping    PT Next Visit Plan  cont with POC for nustep,  core stabilization flexion based exercises, modalities PRN for pain relief    Consulted and Agree with Plan of Care  Patient       Patient will benefit from skilled therapeutic intervention in order to improve the following deficits and impairments:  Decreased activity tolerance, Decreased strength, Decreased range of motion, Difficulty walking, Abnormal gait, Pain, Improper body mechanics, Postural dysfunction  Visit Diagnosis: Chronic bilateral low back pain, unspecified whether sciatica present  Muscle weakness (generalized)     Problem List Patient Active Problem List   Diagnosis Date Noted  . Compression fracture of T12 vertebra ([place]) 10/27/2018  . Spinal stenosis of  lumbosacral region 10/27/2018  . Small bowel obstruction ([place]) 09/30/2018  . Back pain of lumbar region with sciatica 08/26/2018  . Chronic left-sided low back pain with left-sided sciatica 08/26/2018  . Tremor 08/26/2018  . Pure hypercholesterolemia 02/25/2018  .  Atherosclerosis of aorta ([place]) 02/24/2018  . Bilateral hip pain 06/24/2017  . Body mass index 30.0-30.9, adult 06/06/2016  . Gout 06/06/2016  . Essential hypertension 06/06/2016  . Type 2 diabetes mellitus without complication, without long-term current use of insulin ([place]) 06/06/2016  . Chronic mesenteric ischemia ([place]) 03/01/2014  . Peripheral vascular disease ([place]) 02/16/2014  . 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 Personal history of colonic polyp-adenoma 11/24/2013  . Diarrhea 10/21/2013    Gabriela Eves, PT, DPT 12/08/2019, 3:32 PM  [facility] [address] [place], [place], [address] Phone: [phone]   Fax:  [phone]  Name: Connor Bryant MRN: [id number] Date of Birth: 05-12-1946

## 2019-12-10 ENCOUNTER — Ambulatory Visit: Payer: Medicare Other | Admitting: Physical Therapy

## 2019-12-10 ENCOUNTER — Encounter: Payer: Self-pay | Admitting: Physical Therapy

## 2019-12-10 ENCOUNTER — Other Ambulatory Visit: Payer: Self-pay

## 2019-12-10 DIAGNOSIS — M6281 Muscle weakness (generalized): Secondary | ICD-10-CM

## 2019-12-10 DIAGNOSIS — M545 Low back pain: Secondary | ICD-10-CM | POA: Diagnosis not present

## 2019-12-10 DIAGNOSIS — G8929 Other chronic pain: Secondary | ICD-10-CM

## 2019-12-10 NOTE — Therapy (Signed)
[facility] [place], [place], [address] Phone: [phone]   Fax:  [phone]  Physical Therapy Treatment  Patient Details  Name: Connor Bryant MRN: [id number] Date of Birth: Aug 08, 1946 Referring Provider (PT): Melina Schools, MD   Encounter Date: 12/10/2019  PT End of Session - 12/10/19 1425    Visit Number  6    Number of Visits  12    Date for PT Re-Evaluation  01/15/20    Authorization Type  FOTO; progress note every10th visit; KX modifier at 15th visit    PT Start Time  1346    PT Stop Time  1435    PT Time Calculation (min)  49 min    Activity Tolerance  Patient tolerated treatment well    Behavior During Therapy  [facility] for tasks assessed/performed       Past Medical History:  Diagnosis Date  . Arthritis   . DM type 2 (diabetes mellitus, type 2) (HCC)    no meds  . Gallstone   . Heart murmur    was told one time he had a murmur, not since 30 years ago  . HTN (hypertension)   . Hyperlipidemia   . Personal history of colonic polyp-adenoma 11/24/2013  . Renal insufficiency    mild  . Thoracic spine fracture [facility])     Past Surgical History:  Procedure Laterality Date  . ABDOMINAL AORTAGRAM N/A 02/23/2014   Procedure: ABDOMINAL [name];  Surgeon: Conrad [place], MD;  Location: [facility] CATH LAB;  Service: Cardiovascular;  Laterality: N/A;  . COLONOSCOPY    . DEBRIDEMENT TENNIS ELBOW Right   . ESOPHAGOGASTRODUODENOSCOPY    . MESENTERIC ARTERY BYPASS N/A 03/10/2014   Procedure: AORTO TO SUPERIOR MESENTERIC ARTERY BYPASS;  Surgeon: [name], MD;  Location: [place];  Service: Vascular;  Laterality: N/A;  . TONSILLECTOMY AND ADENOIDECTOMY      There were no vitals filed for this visit.  Subjective Assessment - 12/10/19 1353    Subjective  COVID-19 screening performed upon arrival. Patient reports greater pain earlier today.    Pertinent History  HTN, DM, tremors    Limitations  Standing;House hold activities    Diagnostic tests  MRI: see imaging tab    Patient Stated Goals  decrease pain    Currently in Pain?  Yes    Pain Score  1     Pain Location  Back    Pain Orientation  Lower    Pain Descriptors / Indicators  Discomfort    Pain Type  Chronic pain    Pain Onset  More than a month ago    Pain Frequency  Constant         [facility] PT Assessment - 12/10/19 0001      Assessment   Medical Diagnosis  low back pain    Referring Provider (PT)  Melina Schools, MD    Next MD Visit  n/a    Prior Therapy  no      Precautions   Precautions  None      Restrictions   Weight Bearing Restrictions  No                   [facility] Adult PT Treatment/Exercise - 12/10/19 0001      Lumbar Exercises: Stretches   Single Knee to Chest Stretch  Right;Left;3 reps;10 seconds      Lumbar Exercises: Aerobic   Nustep  Level 4 x10 mins (seat 12) UE/LE activity  Lumbar Exercises: Supine   Ab Set  15 reps;5 seconds    Clam  10 reps;5 seconds    Bent Knee Raise  20 reps;3 seconds    Bridge with Ball Squeeze  10 reps;5 seconds      Modalities   Modalities  Electrical Stimulation;Moist Heat;Ultrasound      Moist Heat Therapy   Number Minutes Moist Heat  15 Minutes    Moist Heat Location  Lumbar Spine      Electrical Stimulation   Electrical Stimulation Location  B low back    Electrical Stimulation Action  IFC    Electrical Stimulation Parameters  80-150 hz x15 min    Electrical Stimulation Goals  Pain                  PT Long Term Goals - 12/08/19 1423      PT LONG TERM GOAL #1   Title  Patient will be independent with HEP    Time  4    Period  Weeks    Status  On-going      PT LONG TERM GOAL #2   Title  Patient will report ability to stand for 20 minutes or greater to improve ability to perform home tasks.    Time  4    Period  Weeks    Status  On-going      PT LONG TERM GOAL #3   Title  Patient will report ability to perform ADLs and home tasks with low back pain  less than or equal to 3/10    Time  4    Period  Weeks    Status  On-going            Plan - 12/10/19 1449    Clinical Impression Statement  Patient presented in clinic with low level LBP at the time of arrival but had increased pain earlier. Patient also experiences more pain taking a shower as his shower is not very big. Patient reports low level and managable LBP with bridges. Normal modalities response noted following removal of the modalities.    Personal Factors and Comorbidities  Comorbidity 2    Comorbidities  HTN, DM, tremors    Examination-Activity Limitations  Stand    Stability/Clinical Decision Making  Stable/Uncomplicated    Rehab Potential  Good    PT Frequency  3x / week    PT Duration  4 weeks    PT Treatment/Interventions  Cryotherapy;Electrical Stimulation;ADLs/Self Care Home Management;Iontophoresis 4mg /ml Dexamethasone;Moist Heat;Traction;Ultrasound;Therapeutic exercise;Therapeutic activities;Functional mobility training;Stair training;Gait training;Balance training;Neuromuscular re-education;Manual techniques;Dry needling;Patient/family education;Vasopneumatic Device;Taping    PT Next Visit Plan  cont with POC for nustep,  core stabilization flexion based exercises, modalities PRN for pain relief    PT Home Exercise Plan  see patient education section    Consulted and Agree with Plan of Care  Patient       Patient will benefit from skilled therapeutic intervention in order to improve the following deficits and impairments:  Decreased activity tolerance, Decreased strength, Decreased range of motion, Difficulty walking, Abnormal gait, Pain, Improper body mechanics, Postural dysfunction  Visit Diagnosis: Chronic bilateral low back pain, unspecified whether sciatica present  Muscle weakness (generalized)     Problem List Patient Active Problem List   Diagnosis Date Noted  . Compression fracture of T12 vertebra ([place]) 10/27/2018  . Spinal stenosis of  lumbosacral region 10/27/2018  . Small bowel obstruction ([place]) 09/30/2018  . Back pain of lumbar region with sciatica 08/26/2018  .  Chronic left-sided low back pain with left-sided sciatica 08/26/2018  . Tremor 08/26/2018  . Pure hypercholesterolemia 02/25/2018  . Atherosclerosis of aorta ([place]) 02/24/2018  . Bilateral hip pain 06/24/2017  . Body mass index 30.0-30.9, adult 06/06/2016  . Gout 06/06/2016  . Essential hypertension 06/06/2016  . Type 2 diabetes mellitus without complication, without long-term current use of insulin ([place]) 06/06/2016  . Chronic mesenteric ischemia ([place]) 03/01/2014  . Peripheral vascular disease ([place]) 02/16/2014  . 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 Personal history of colonic polyp-adenoma 11/24/2013  . Diarrhea 10/21/2013    Standley Brooking, PTA 12/10/2019, 2:53 PM  [facility] [address] [place], [place], [address] Phone: [phone]   Fax:  [phone]  Name: Connor Bryant MRN: [id number] Date of Birth: 10-04-1945

## 2019-12-15 ENCOUNTER — Ambulatory Visit: Payer: Medicare Other | Admitting: *Deleted

## 2019-12-15 ENCOUNTER — Other Ambulatory Visit: Payer: Self-pay

## 2019-12-15 DIAGNOSIS — M6281 Muscle weakness (generalized): Secondary | ICD-10-CM

## 2019-12-15 DIAGNOSIS — M545 Low back pain: Secondary | ICD-10-CM | POA: Diagnosis not present

## 2019-12-15 DIAGNOSIS — G8929 Other chronic pain: Secondary | ICD-10-CM | POA: Diagnosis not present

## 2019-12-15 NOTE — Therapy (Addendum)
[facility] [place], [place], [address] Phone: [phone]   Fax:  [phone]  Physical Therapy Treatment PHYSICAL THERAPY DISCHARGE SUMMARY  Visits from Start of Care: 7  Current functional level related to goals / functional outcomes: See below   Remaining deficits: See goals   Education / Equipment: HEP Plan: Patient agrees to discharge.  Patient goals were not met. Patient is being discharged due to not returning since the last visit.  ?????     Patient Details  Name: Connor Bryant MRN: [id number] Date of Birth: Aug 26, 1946 Referring Provider (PT): Melina Schools, MD   Encounter Date: 12/15/2019  PT End of Session - 12/15/19 1421    Visit Number  7    Number of Visits  12    Date for PT Re-Evaluation  01/15/20    Authorization Type  FOTO; progress note every10th visit; KX modifier at 15th visit    PT Start Time  1345    PT Stop Time  1434    PT Time Calculation (min)  49 min       Past Medical History:  Diagnosis Date  . Arthritis   . DM type 2 (diabetes mellitus, type 2) (HCC)    no meds  . Gallstone   . Heart murmur    was told one time he had a murmur, not since 30 years ago  . HTN (hypertension)   . Hyperlipidemia   . Personal history of colonic polyp-adenoma 11/24/2013  . Renal insufficiency    mild  . Thoracic spine fracture [facility])     Past Surgical History:  Procedure Laterality Date  . ABDOMINAL AORTAGRAM N/A 02/23/2014   Procedure: ABDOMINAL [name];  Surgeon: Conrad SeaTac, MD;  Location: [facility] CATH LAB;  Service: Cardiovascular;  Laterality: N/A;  . COLONOSCOPY    . DEBRIDEMENT TENNIS ELBOW Right   . ESOPHAGOGASTRODUODENOSCOPY    . MESENTERIC ARTERY BYPASS N/A 03/10/2014   Procedure: AORTO TO SUPERIOR MESENTERIC ARTERY BYPASS;  Surgeon: [name], MD;  Location: [place];  Service: Vascular;  Laterality: N/A;  . TONSILLECTOMY AND ADENOIDECTOMY      There were no vitals filed for this  visit.  Subjective Assessment - 12/15/19 1357    Pertinent History  HTN, DM, tremors    Limitations  Standing;House hold activities    Diagnostic tests  MRI: see imaging tab    Patient Stated Goals  decrease pain    Currently in Pain?  Yes    Pain Score  1     Pain Location  Back    Pain Orientation  Lower    Pain Descriptors / Indicators  Discomfort    Pain Type  Chronic pain    Pain Onset  More than a month ago                       [facility] Adult PT Treatment/Exercise - 12/15/19 0001      Exercises   Exercises  Lumbar      Lumbar Exercises: Stretches   Single Knee to Chest Stretch  Right;Left;3 reps;10 seconds      Lumbar Exercises: Aerobic   Nustep  Level 4 x10 mins (seat 12) UE/LE activity      Lumbar Exercises: Supine   Ab Set  15 reps;5 seconds    Clam  5 seconds;20 reps   red tband   Bent Knee Raise  20 reps;3 seconds    Bridge with [employer]  Squeeze  10 reps;5 seconds      Modalities   Modalities  Electrical Stimulation;Moist Heat;Ultrasound      Moist Heat Therapy   Number Minutes Moist Heat  15 Minutes    Moist Heat Location  Lumbar Spine      Electrical Stimulation   Electrical Stimulation Location  Bil low back    Electrical Stimulation Action  IFC    Electrical Stimulation Parameters  80-150hz  x 15 min    Electrical Stimulation Goals  Pain                  PT Long Term Goals - 12/08/19 1423      PT LONG TERM GOAL #1   Title  Patient will be independent with HEP    Time  4    Period  Weeks    Status  On-going      PT LONG TERM GOAL #2   Title  Patient will report ability to stand for 20 minutes or greater to improve ability to perform home tasks.    Time  4    Period  Weeks    Status  On-going      PT LONG TERM GOAL #3   Title  Patient will report ability to perform ADLs and home tasks with low back pain less than or equal to 3/10    Time  4    Period  Weeks    Status  On-going            Plan - 12/15/19 1422     Clinical Impression Statement  Pt arrived today doing ok with minimal LBP. He reports that he thinks PT is helping because he can stand longer when cooking and in the shower before pain gets worse. He was able to complete supine core strengthening and did well. Normal modality response today.    Personal Factors and Comorbidities  Comorbidity 2    Comorbidities  HTN, DM, tremors    Stability/Clinical Decision Making  Stable/Uncomplicated    Rehab Potential  Good    PT Frequency  3x / week    PT Duration  4 weeks    PT Treatment/Interventions  Cryotherapy;Electrical Stimulation;ADLs/Self Care Home Management;Iontophoresis 48m/ml Dexamethasone;Moist Heat;Traction;Ultrasound;Therapeutic exercise;Therapeutic activities;Functional mobility training;Stair training;Gait training;Balance training;Neuromuscular re-education;Manual techniques;Dry needling;Patient/family education;Vasopneumatic Device;Taping    PT Next Visit Plan  cont with POC for nustep,  core stabilization flexion based exercises, modalities PRN for pain relief     Try standing XTS core strengthening    PT Home Exercise Plan  see patient education section    Consulted and Agree with Plan of Care  Patient       Patient will benefit from skilled therapeutic intervention in order to improve the following deficits and impairments:  Decreased activity tolerance, Decreased strength, Decreased range of motion, Difficulty walking, Abnormal gait, Pain, Improper body mechanics, Postural dysfunction  Visit Diagnosis: Chronic bilateral low back pain, unspecified whether sciatica present  Muscle weakness (generalized)     Problem List Patient Active Problem List   Diagnosis Date Noted  . Compression fracture of T12 vertebra ([place] 10/27/2018  . Spinal stenosis of lumbosacral region 10/27/2018  . Small bowel obstruction ([place] 09/30/2018  . Back pain of lumbar region with sciatica 08/26/2018  . Chronic left-sided low back pain with  left-sided sciatica 08/26/2018  . Tremor 08/26/2018  . Pure hypercholesterolemia 02/25/2018  . Atherosclerosis of aorta ([place] 02/24/2018  . Bilateral hip pain 06/24/2017  . Body mass index 30.0-30.9,  adult 06/06/2016  . Gout 06/06/2016  . Essential hypertension 06/06/2016  . Type 2 diabetes mellitus without complication, without long-term current use of insulin ([place]) 06/06/2016  . Chronic mesenteric ischemia ([place]) 03/01/2014  . Peripheral vascular disease ([place]) 02/16/2014  . 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 Personal history of colonic polyp-adenoma 11/24/2013  . Diarrhea 10/21/2013    Nyree Applegate,CHRIS, PTA 12/15/2019, 3:46 PM  [facility] [place], [place], [address] Phone: [phone]   Fax:  [phone]  Name: Connor Bryant MRN: [id number] Date of Birth: 1945/10/06

## 2019-12-17 ENCOUNTER — Encounter: Payer: Medicare Other | Admitting: Physical Therapy

## 2020-01-28 LAB — HM DIABETES EYE EXAM

## 2020-02-29 ENCOUNTER — Ambulatory Visit: Payer: Medicare Other | Admitting: Physician Assistant

## 2020-02-29 ENCOUNTER — Ambulatory Visit (INDEPENDENT_AMBULATORY_CARE_PROVIDER_SITE_OTHER): Payer: Medicare Other | Admitting: Family Medicine

## 2020-02-29 ENCOUNTER — Encounter: Payer: Self-pay | Admitting: Family Medicine

## 2020-02-29 ENCOUNTER — Other Ambulatory Visit: Payer: Self-pay

## 2020-02-29 VITALS — BP 131/61 | HR 69 | Temp 97.6°F | Resp 20 | Ht 69.0 in | Wt 227.0 lb

## 2020-02-29 DIAGNOSIS — M1A9XX0 Chronic gout, unspecified, without tophus (tophi): Secondary | ICD-10-CM | POA: Diagnosis not present

## 2020-02-29 DIAGNOSIS — I10 Essential (primary) hypertension: Secondary | ICD-10-CM

## 2020-02-29 DIAGNOSIS — E1122 Type 2 diabetes mellitus with diabetic chronic kidney disease: Secondary | ICD-10-CM

## 2020-02-29 DIAGNOSIS — E1169 Type 2 diabetes mellitus with other specified complication: Secondary | ICD-10-CM

## 2020-02-29 DIAGNOSIS — E756 Lipid storage disorder, unspecified: Secondary | ICD-10-CM

## 2020-02-29 DIAGNOSIS — I739 Peripheral vascular disease, unspecified: Secondary | ICD-10-CM | POA: Diagnosis not present

## 2020-02-29 LAB — BAYER DCA HB A1C WAIVED: HB A1C (BAYER DCA - WAIVED): 6.1 % (ref <7.0)

## 2020-02-29 MED ORDER — ATORVASTATIN CALCIUM 20 MG PO TABS: 20.0000 mg | ORAL_TABLET | Freq: Every day | ORAL | 3 refills | Status: DC

## 2020-02-29 MED ORDER — VERAPAMIL HCL ER 240 MG PO TBCR: 480.0000 mg | EXTENDED_RELEASE_TABLET | Freq: Every day | ORAL | 3 refills | Status: DC

## 2020-02-29 MED ORDER — METFORMIN HCL ER 500 MG PO TB24: ORAL_TABLET | ORAL | 3 refills | Status: DC

## 2020-02-29 MED ORDER — ALLOPURINOL 100 MG PO TABS: ORAL_TABLET | ORAL | 3 refills | Status: DC

## 2020-02-29 MED ORDER — PIOGLITAZONE HCL 30 MG PO TABS: 30.0000 mg | ORAL_TABLET | Freq: Every day | ORAL | 3 refills | Status: DC

## 2020-02-29 MED ORDER — TRANDOLAPRIL 4 MG PO TABS: 4.0000 mg | ORAL_TABLET | Freq: Every day | ORAL | 3 refills | Status: DC

## 2020-02-29 MED ORDER — METOPROLOL SUCCINATE ER 200 MG PO TB24: 200.0000 mg | ORAL_TABLET | Freq: Every day | ORAL | 3 refills | Status: DC

## 2020-02-29 MED ORDER — HYDRALAZINE HCL 50 MG PO TABS: 50.0000 mg | ORAL_TABLET | Freq: Three times a day (TID) | ORAL | 3 refills | Status: DC

## 2020-02-29 MED ORDER — HYDROCHLOROTHIAZIDE 25 MG PO TABS: ORAL_TABLET | ORAL | 3 refills | Status: DC

## 2020-02-29 NOTE — Progress Notes (Addendum)
Subjective:  Patient ID: Connor Bryant,  male    DOB: 1946/08/10  Age: 74 y.o.    CC: Medical Management of Chronic Issues   HPI Connor Bryant presents for  follow-up of hypertension. Patient has no history of headache chest pain or shortness of breath or recent cough. Patient also denies symptoms of TIA such as numbness weakness lateralizing. Patient denies side effects from medication. States taking it regularly.  Patient also  in for follow-up of elevated cholesterol. Doing well without complaints on current medication. Denies side effects  including myalgia and arthralgia and nausea. Also in today for liver function testing. Currently no chest pain, shortness of breath or other cardiovascular related symptoms noted.  Follow-up of diabetes. Patient does not check blood sugar at home.  He has never been told that he should.  He relies on the A1c which is also very good usually.  Medications are as noted below he takes them regularly without any problems.  Patient denies symptoms such as excessive hunger or urinary frequency, excessive hunger, nausea No significant hypoglycemic spells noted. Medications reviewed. Pt reports taking them regularly. Pt. denies complication/adverse reaction today.  Patient is considering vascular surgery due to bilateral iliac stenosis.  He is used Dr. Sherren Mocha Early in the past.  He would like to have a referral.  History Yoshimi has a past medical history of Arthritis, DM type 2 (diabetes mellitus, type 2) ([place]), Gallstone, Heart murmur, HTN (hypertension), Hyperlipidemia, Personal history of colonic polyp-adenoma (11/24/2013), Renal insufficiency, and Thoracic spine fracture ([place]).   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   His family history includes AAA (abdominal  aortic aneurysm) in his mother; Bladder Cancer in his father; Cancer in his father; Coronary artery disease in his father; Diabetes in his father and mother; Heart attack in his father; Heart disease in his father and mother; Hyperlipidemia in his father and mother; Hypertension in his father and mother.He reports that he quit smoking about 10 years ago. His smoking use included cigarettes. He has quit using smokeless tobacco.  His smokeless tobacco use included chew. He reports current alcohol use of about 12.0 standard drinks of alcohol per week. He reports that he does not use drugs.  Current Outpatient Medications on File Prior to Visit  Medication Sig Dispense Refill   aspirin 81 MG tablet Take 81 mg by mouth daily.     loratadine (CLARITIN) 10 MG tablet Take 1 tablet (10 mg total) by mouth daily. 90 tablet 3   polyethylene glycol (MIRALAX / GLYCOLAX) packet Take 17 g by mouth daily. (Patient taking differently: Take 8.5 g by mouth daily. ) 30 packet 1   No current facility-administered medications on file prior to visit.    ROS Review of Systems  Constitutional: Negative.   HENT: Negative.   Eyes: Negative for visual disturbance.  Respiratory: Negative for cough and shortness of breath.   Cardiovascular: Negative for chest pain and leg swelling.  Gastrointestinal: Negative for abdominal pain, diarrhea, nausea and vomiting.  Genitourinary: Negative for difficulty urinating.  Musculoskeletal: Positive for gait problem (pain in legs). Negative for arthralgias and myalgias.  Skin: Negative for rash.  Neurological: Negative for headaches.  Psychiatric/Behavioral: Negative for sleep disturbance.    Objective:  BP 131/61    Pulse 69    Temp 97.6 F (36.4 C) (Temporal)    Resp 20  Ht _0  (1.753 m)    Wt 227 lb (103 kg)    SpO2 95%    BMI 33.52 kg/m   BP Readings from Last 3 Encounters:  02/29/20 131/61  08/27/19 (!) 166/60  06/02/19 (!) 170/62    Wt Readings from Last 3  Encounters:  02/29/20 227 lb (103 kg)  08/27/19 233 lb 6.4 oz (105.9 kg)  06/02/19 227 lb 9.6 oz (103.2 kg)     Physical Exam Vitals reviewed.  Constitutional:      Appearance: He is well-developed.  HENT:     Head: Normocephalic and atraumatic.     Right Ear: Tympanic membrane and external ear normal. No decreased hearing noted.     Left Ear: Tympanic membrane and external ear normal. No decreased hearing noted.     Mouth/Throat:     Pharynx: No oropharyngeal exudate or posterior oropharyngeal erythema.  Eyes:     Pupils: Pupils are equal, round, and reactive to light.  Cardiovascular:     Rate and Rhythm: Normal rate and regular rhythm.     Heart sounds: No murmur.  Pulmonary:     Effort: No respiratory distress.     Breath sounds: Normal breath sounds.  Abdominal:     Palpations: Abdomen is soft.     Tenderness: There is no abdominal tenderness.  Musculoskeletal:     Cervical back: Normal range of motion and neck supple.  Psychiatric:        Mood and Affect: Mood normal.     Diabetic Foot Exam - Simple   No data filed        Assessment & Plan:   Connor Bryant was seen today for medical management of chronic issues.  Diagnoses and all orders for this visit:  Type 2 diabetes mellitus with chronic kidney disease, without long-term current use of insulin, unspecified CKD stage (HCC) -     Microalbumin / creatinine urine ratio; Future -     Bayer DCA Hb A1c Waived; Future -     CBC with Differential/Platelet; Future -     CMP14+EGFR; Future -     Lipid panel; Future -     Lipid panel -     CMP14+EGFR -     CBC with Differential/Platelet -     Bayer DCA Hb A1c Waived -     Microalbumin / creatinine urine ratio -     atorvastatin (LIPITOR) 20 MG tablet; Take 1 tablet (20 mg total) by mouth daily. -     metFORMIN (GLUCOPHAGE-XR) 500 MG 24 hr tablet; 2 tablet with evening meal Once a day -     pioglitazone (ACTOS) 30 MG tablet; Take 1 tablet (30 mg total) by mouth  daily. -     trandolapril (MAVIK) 4 MG tablet; Take 1 tablet (4 mg total) by mouth daily.  Essential hypertension -     CBC with Differential/Platelet; Future -     CMP14+EGFR; Future -     Lipid panel; Future -     Lipid panel -     CMP14+EGFR -     CBC with Differential/Platelet -     hydrALAZINE (APRESOLINE) 50 MG tablet; Take 1 tablet (50 mg total) by mouth 3 (three) times daily. -     hydrochlorothiazide (HYDRODIURIL) 25 MG tablet; TAKE 1 OR 2 TABLETS DAILY IN THE MORNING AS NEEDED -     metoprolol (TOPROL-XL) 200 MG 24 hr tablet; Take 1 tablet (200 mg total) by mouth daily. -  trandolapril (MAVIK) 4 MG tablet; Take 1 tablet (4 mg total) by mouth daily. -     verapamil (CALAN-SR) 240 MG CR tablet; Take 2 tablets (480 mg total) by mouth daily.  Diabetic lipidosis (HCC) -     atorvastatin (LIPITOR) 20 MG tablet; Take 1 tablet (20 mg total) by mouth daily. -     metFORMIN (GLUCOPHAGE-XR) 500 MG 24 hr tablet; 2 tablet with evening meal Once a day -     pioglitazone (ACTOS) 30 MG tablet; Take 1 tablet (30 mg total) by mouth daily. -     trandolapril (MAVIK) 4 MG tablet; Take 1 tablet (4 mg total) by mouth daily.  Intermittent claudication ([place]) -     Ambulatory referral to Vascular Surgery  Chronic gout without tophus, unspecified cause, unspecified site -     allopurinol (ZYLOPRIM) 100 MG tablet; TAKE 1 TABLET 2 TO 3 TIMES A DAY FOR GOUT   I am having Connor Bryant "[name]" maintain his aspirin, polyethylene glycol, loratadine, allopurinol, atorvastatin, hydrALAZINE, hydrochlorothiazide, metFORMIN, metoprolol, pioglitazone, trandolapril, and verapamil.  Meds ordered this encounter  Medications   allopurinol (ZYLOPRIM) 100 MG tablet    Sig: TAKE 1 TABLET 2 TO 3 TIMES A DAY FOR GOUT    Dispense:  90 tablet    Refill:  3   atorvastatin (LIPITOR) 20 MG tablet    Sig: Take 1 tablet (20 mg total) by mouth daily.    Dispense:  90 tablet    Refill:  3   hydrALAZINE (APRESOLINE)  50 MG tablet    Sig: Take 1 tablet (50 mg total) by mouth 3 (three) times daily.    Dispense:  270 tablet    Refill:  3    Put on file until patient calls   hydrochlorothiazide (HYDRODIURIL) 25 MG tablet    Sig: TAKE 1 OR 2 TABLETS DAILY IN THE MORNING AS NEEDED    Dispense:  180 tablet    Refill:  3    Put on file until patient calls   metFORMIN (GLUCOPHAGE-XR) 500 MG 24 hr tablet    Sig: 2 tablet with evening meal Once a day    Dispense:  180 tablet    Refill:  3    Put on file until patient calls   metoprolol (TOPROL-XL) 200 MG 24 hr tablet    Sig: Take 1 tablet (200 mg total) by mouth daily.    Dispense:  90 tablet    Refill:  3    Put on file until patient calls   pioglitazone (ACTOS) 30 MG tablet    Sig: Take 1 tablet (30 mg total) by mouth daily.    Dispense:  90 tablet    Refill:  3    Put on file until patient calls   trandolapril (MAVIK) 4 MG tablet    Sig: Take 1 tablet (4 mg total) by mouth daily.    Dispense:  90 tablet    Refill:  3    Put on file until patient calls   verapamil (CALAN-SR) 240 MG CR tablet    Sig: Take 2 tablets (480 mg total) by mouth daily.    Dispense:  180 tablet    Refill:  3    Put on file until patient calls     Follow-up: No follow-ups on file.  Claretta Fraise, M.D.

## 2020-02-29 NOTE — Addendum Note (Signed)
Addended by: Claretta Fraise on: 02/29/2020 10:34 PM   Modules accepted: Orders

## 2020-03-01 LAB — CMP14+EGFR
ALT: 24 IU/L (ref 0–44)
AST: 16 IU/L (ref 0–40)
Albumin/Globulin Ratio: 1.7 (ref 1.2–2.2)
Albumin: 4.3 g/dL (ref 3.7–4.7)
Alkaline Phosphatase: 91 IU/L (ref 48–121)
BUN/Creatinine Ratio: 22 (ref 10–24)
BUN: 42 mg/dL — ABNORMAL HIGH (ref 8–27)
Bilirubin Total: 0.3 mg/dL (ref 0.0–1.2)
CO2: 22 mmol/L (ref 20–29)
Calcium: 9.5 mg/dL (ref 8.6–10.2)
Chloride: 106 mmol/L (ref 96–106)
Creatinine, Ser: 1.91 mg/dL — ABNORMAL HIGH (ref 0.76–1.27)
GFR calc Af Amer: 39 mL/min/{1.73_m2} — ABNORMAL LOW (ref >59)
GFR calc non Af Amer: 34 mL/min/{1.73_m2} — ABNORMAL LOW (ref >59)
Globulin, Total: 2.5 g/dL (ref 1.5–4.5)
Glucose: 112 mg/dL — ABNORMAL HIGH (ref 65–99)
Potassium: 4.6 mmol/L (ref 3.5–5.2)
Sodium: 142 mmol/L (ref 134–144)
Total Protein: 6.8 g/dL (ref 6.0–8.5)

## 2020-03-01 LAB — CBC WITH DIFFERENTIAL/PLATELET
Basophils Absolute: 0 10*3/uL (ref 0.0–0.2)
Basos: 0 %
EOS (ABSOLUTE): 0.2 10*3/uL (ref 0.0–0.4)
Eos: 4 %
Hematocrit: 35.7 % — ABNORMAL LOW (ref 37.5–51.0)
Hemoglobin: 11.4 g/dL — ABNORMAL LOW (ref 13.0–17.7)
Immature Grans (Abs): 0 10*3/uL (ref 0.0–0.1)
Immature Granulocytes: 1 %
Lymphocytes Absolute: 1.5 10*3/uL (ref 0.7–3.1)
Lymphs: 23 %
MCH: 30.1 pg (ref 26.6–33.0)
MCHC: 31.9 g/dL (ref 31.5–35.7)
MCV: 94 fL (ref 79–97)
Monocytes Absolute: 0.7 10*3/uL (ref 0.1–0.9)
Monocytes: 11 %
Neutrophils Absolute: 3.8 10*3/uL (ref 1.4–7.0)
Neutrophils: 61 %
Platelets: 188 10*3/uL (ref 150–450)
RBC: 3.79 x10E6/uL — ABNORMAL LOW (ref 4.14–5.80)
RDW: 14.5 % (ref 11.6–15.4)
WBC: 6.2 10*3/uL (ref 3.4–10.8)

## 2020-03-01 LAB — MICROALBUMIN / CREATININE URINE RATIO
Creatinine, Urine: 77.3 mg/dL
Microalb/Creat Ratio: 53 mg/g creat — ABNORMAL HIGH (ref 0–29)
Microalbumin, Urine: 40.7 ug/mL

## 2020-03-01 LAB — LIPID PANEL
Chol/HDL Ratio: 2.2 ratio (ref 0.0–5.0)
Cholesterol, Total: 180 mg/dL (ref 100–199)
HDL: 81 mg/dL (ref >39)
LDL Chol Calc (NIH): 80 mg/dL (ref 0–99)
Triglycerides: 106 mg/dL (ref 0–149)
VLDL Cholesterol Cal: 19 mg/dL (ref 5–40)

## 2020-03-10 DIAGNOSIS — I739 Peripheral vascular disease, unspecified: Secondary | ICD-10-CM | POA: Insufficient documentation

## 2020-03-10 DIAGNOSIS — E1169 Type 2 diabetes mellitus with other specified complication: Secondary | ICD-10-CM | POA: Insufficient documentation

## 2020-03-10 DIAGNOSIS — E756 Lipid storage disorder, unspecified: Secondary | ICD-10-CM | POA: Insufficient documentation

## 2020-03-17 DIAGNOSIS — I739 Peripheral vascular disease, unspecified: Secondary | ICD-10-CM | POA: Diagnosis not present

## 2020-03-24 DIAGNOSIS — I70219 Atherosclerosis of native arteries of extremities with intermittent claudication, unspecified extremity: Secondary | ICD-10-CM | POA: Diagnosis not present

## 2020-03-24 DIAGNOSIS — E278 Other specified disorders of adrenal gland: Secondary | ICD-10-CM | POA: Diagnosis not present

## 2020-03-25 DIAGNOSIS — I70212 Atherosclerosis of native arteries of extremities with intermittent claudication, left leg: Secondary | ICD-10-CM | POA: Diagnosis not present

## 2020-03-25 DIAGNOSIS — I70219 Atherosclerosis of native arteries of extremities with intermittent claudication, unspecified extremity: Secondary | ICD-10-CM | POA: Diagnosis not present

## 2020-03-25 DIAGNOSIS — E278 Other specified disorders of adrenal gland: Secondary | ICD-10-CM | POA: Diagnosis not present

## 2020-03-31 DIAGNOSIS — I739 Peripheral vascular disease, unspecified: Secondary | ICD-10-CM | POA: Diagnosis not present

## 2020-04-22 DIAGNOSIS — Z20822 Contact with and (suspected) exposure to covid-19: Secondary | ICD-10-CM | POA: Diagnosis not present

## 2020-04-22 DIAGNOSIS — I70213 Atherosclerosis of native arteries of extremities with intermittent claudication, bilateral legs: Secondary | ICD-10-CM | POA: Diagnosis not present

## 2020-04-22 DIAGNOSIS — Z01812 Encounter for preprocedural laboratory examination: Secondary | ICD-10-CM | POA: Diagnosis not present

## 2020-04-27 DIAGNOSIS — I70211 Atherosclerosis of native arteries of extremities with intermittent claudication, right leg: Secondary | ICD-10-CM | POA: Diagnosis not present

## 2020-04-27 DIAGNOSIS — Z87891 Personal history of nicotine dependence: Secondary | ICD-10-CM | POA: Diagnosis not present

## 2020-04-27 DIAGNOSIS — I70213 Atherosclerosis of native arteries of extremities with intermittent claudication, bilateral legs: Secondary | ICD-10-CM | POA: Diagnosis not present

## 2020-05-01 ENCOUNTER — Encounter (HOSPITAL_COMMUNITY): Payer: Self-pay

## 2020-05-01 ENCOUNTER — Inpatient Hospital Stay (HOSPITAL_COMMUNITY)
Admission: EM | Admit: 2020-05-01 | Discharge: 2020-05-17 | DRG: 064 | Disposition: A | Discharge status: Home Health | Payer: Medicare Other | Attending: Internal Medicine | Admitting: Internal Medicine

## 2020-05-01 ENCOUNTER — Emergency Department (HOSPITAL_COMMUNITY): Payer: Medicare Other

## 2020-05-01 ENCOUNTER — Other Ambulatory Visit: Payer: Self-pay

## 2020-05-01 DIAGNOSIS — R509 Fever, unspecified: Secondary | ICD-10-CM | POA: Diagnosis not present

## 2020-05-01 DIAGNOSIS — R4182 Altered mental status, unspecified: Secondary | ICD-10-CM | POA: Diagnosis not present

## 2020-05-01 DIAGNOSIS — J9690 Respiratory failure, unspecified, unspecified whether with hypoxia or hypercapnia: Secondary | ICD-10-CM | POA: Diagnosis not present

## 2020-05-01 DIAGNOSIS — Y95 Nosocomial condition: Secondary | ICD-10-CM | POA: Diagnosis not present

## 2020-05-01 DIAGNOSIS — I214 Non-ST elevation (NSTEMI) myocardial infarction: Secondary | ICD-10-CM

## 2020-05-01 DIAGNOSIS — I480 Paroxysmal atrial fibrillation: Secondary | ICD-10-CM

## 2020-05-01 DIAGNOSIS — I739 Peripheral vascular disease, unspecified: Secondary | ICD-10-CM | POA: Diagnosis present

## 2020-05-01 DIAGNOSIS — Z4682 Encounter for fitting and adjustment of non-vascular catheter: Secondary | ICD-10-CM | POA: Diagnosis not present

## 2020-05-01 DIAGNOSIS — Z6832 Body mass index (BMI) 32.0-32.9, adult: Secondary | ICD-10-CM

## 2020-05-01 DIAGNOSIS — J810 Acute pulmonary edema: Secondary | ICD-10-CM | POA: Diagnosis not present

## 2020-05-01 DIAGNOSIS — R58 Hemorrhage, not elsewhere classified: Secondary | ICD-10-CM | POA: Diagnosis not present

## 2020-05-01 DIAGNOSIS — Z66 Do not resuscitate: Secondary | ICD-10-CM | POA: Diagnosis not present

## 2020-05-01 DIAGNOSIS — I2699 Other pulmonary embolism without acute cor pulmonale: Secondary | ICD-10-CM | POA: Diagnosis not present

## 2020-05-01 DIAGNOSIS — R319 Hematuria, unspecified: Secondary | ICD-10-CM | POA: Diagnosis not present

## 2020-05-01 DIAGNOSIS — Z978 Presence of other specified devices: Secondary | ICD-10-CM | POA: Diagnosis not present

## 2020-05-01 DIAGNOSIS — I130 Hypertensive heart and chronic kidney disease with heart failure and stage 1 through stage 4 chronic kidney disease, or unspecified chronic kidney disease: Secondary | ICD-10-CM | POA: Diagnosis not present

## 2020-05-01 DIAGNOSIS — I10 Essential (primary) hypertension: Secondary | ICD-10-CM | POA: Diagnosis not present

## 2020-05-01 DIAGNOSIS — H53462 Homonymous bilateral field defects, left side: Secondary | ICD-10-CM | POA: Diagnosis present

## 2020-05-01 DIAGNOSIS — M5442 Lumbago with sciatica, left side: Secondary | ICD-10-CM | POA: Diagnosis present

## 2020-05-01 DIAGNOSIS — I639 Cerebral infarction, unspecified: Secondary | ICD-10-CM

## 2020-05-01 DIAGNOSIS — Z7984 Long term (current) use of oral hypoglycemic drugs: Secondary | ICD-10-CM

## 2020-05-01 DIAGNOSIS — Z83438 Family history of other disorder of lipoprotein metabolism and other lipidemia: Secondary | ICD-10-CM

## 2020-05-01 DIAGNOSIS — E756 Lipid storage disorder, unspecified: Secondary | ICD-10-CM

## 2020-05-01 DIAGNOSIS — M1990 Unspecified osteoarthritis, unspecified site: Secondary | ICD-10-CM | POA: Diagnosis present

## 2020-05-01 DIAGNOSIS — J9601 Acute respiratory failure with hypoxia: Secondary | ICD-10-CM | POA: Diagnosis not present

## 2020-05-01 DIAGNOSIS — E785 Hyperlipidemia, unspecified: Secondary | ICD-10-CM | POA: Diagnosis not present

## 2020-05-01 DIAGNOSIS — R778 Other specified abnormalities of plasma proteins: Secondary | ICD-10-CM | POA: Diagnosis not present

## 2020-05-01 DIAGNOSIS — I5021 Acute systolic (congestive) heart failure: Secondary | ICD-10-CM

## 2020-05-01 DIAGNOSIS — H53139 Sudden visual loss, unspecified eye: Secondary | ICD-10-CM | POA: Diagnosis not present

## 2020-05-01 DIAGNOSIS — Z95820 Peripheral vascular angioplasty status with implants and grafts: Secondary | ICD-10-CM

## 2020-05-01 DIAGNOSIS — Z8249 Family history of ischemic heart disease and other diseases of the circulatory system: Secondary | ICD-10-CM

## 2020-05-01 DIAGNOSIS — Z95828 Presence of other vascular implants and grafts: Secondary | ICD-10-CM

## 2020-05-01 DIAGNOSIS — N183 Chronic kidney disease, stage 3 unspecified: Secondary | ICD-10-CM | POA: Diagnosis not present

## 2020-05-01 DIAGNOSIS — E1122 Type 2 diabetes mellitus with diabetic chronic kidney disease: Secondary | ICD-10-CM | POA: Diagnosis present

## 2020-05-01 DIAGNOSIS — H5462 Unqualified visual loss, left eye, normal vision right eye: Secondary | ICD-10-CM | POA: Diagnosis present

## 2020-05-01 DIAGNOSIS — E1165 Type 2 diabetes mellitus with hyperglycemia: Secondary | ICD-10-CM | POA: Diagnosis not present

## 2020-05-01 DIAGNOSIS — Z7902 Long term (current) use of antithrombotics/antiplatelets: Secondary | ICD-10-CM

## 2020-05-01 DIAGNOSIS — R079 Chest pain, unspecified: Secondary | ICD-10-CM | POA: Diagnosis not present

## 2020-05-01 DIAGNOSIS — R7989 Other specified abnormal findings of blood chemistry: Secondary | ICD-10-CM | POA: Diagnosis present

## 2020-05-01 DIAGNOSIS — N179 Acute kidney failure, unspecified: Secondary | ICD-10-CM | POA: Diagnosis present

## 2020-05-01 DIAGNOSIS — I2693 Single subsegmental pulmonary embolism without acute cor pulmonale: Secondary | ICD-10-CM | POA: Diagnosis not present

## 2020-05-01 DIAGNOSIS — M109 Gout, unspecified: Secondary | ICD-10-CM | POA: Diagnosis not present

## 2020-05-01 DIAGNOSIS — I6389 Other cerebral infarction: Secondary | ICD-10-CM | POA: Diagnosis not present

## 2020-05-01 DIAGNOSIS — Z79899 Other long term (current) drug therapy: Secondary | ICD-10-CM

## 2020-05-01 DIAGNOSIS — E119 Type 2 diabetes mellitus without complications: Secondary | ICD-10-CM | POA: Diagnosis not present

## 2020-05-01 DIAGNOSIS — I4510 Unspecified right bundle-branch block: Secondary | ICD-10-CM | POA: Diagnosis present

## 2020-05-01 DIAGNOSIS — G8929 Other chronic pain: Secondary | ICD-10-CM | POA: Diagnosis present

## 2020-05-01 DIAGNOSIS — D509 Iron deficiency anemia, unspecified: Secondary | ICD-10-CM | POA: Diagnosis not present

## 2020-05-01 DIAGNOSIS — N1832 Chronic kidney disease, stage 3b: Secondary | ICD-10-CM | POA: Diagnosis not present

## 2020-05-01 DIAGNOSIS — Z8719 Personal history of other diseases of the digestive system: Secondary | ICD-10-CM | POA: Diagnosis not present

## 2020-05-01 DIAGNOSIS — E876 Hypokalemia: Secondary | ICD-10-CM | POA: Diagnosis not present

## 2020-05-01 DIAGNOSIS — R0902 Hypoxemia: Secondary | ICD-10-CM

## 2020-05-01 DIAGNOSIS — I63233 Cerebral infarction due to unspecified occlusion or stenosis of bilateral carotid arteries: Secondary | ICD-10-CM | POA: Diagnosis not present

## 2020-05-01 DIAGNOSIS — E663 Overweight: Secondary | ICD-10-CM | POA: Diagnosis present

## 2020-05-01 DIAGNOSIS — I517 Cardiomegaly: Secondary | ICD-10-CM | POA: Diagnosis not present

## 2020-05-01 DIAGNOSIS — R000 Tachycardia, unspecified: Secondary | ICD-10-CM | POA: Diagnosis present

## 2020-05-01 DIAGNOSIS — R0602 Shortness of breath: Secondary | ICD-10-CM

## 2020-05-01 DIAGNOSIS — Z7982 Long term (current) use of aspirin: Secondary | ICD-10-CM | POA: Diagnosis not present

## 2020-05-01 DIAGNOSIS — I440 Atrioventricular block, first degree: Secondary | ICD-10-CM | POA: Diagnosis present

## 2020-05-01 DIAGNOSIS — R29702 NIHSS score 2: Secondary | ICD-10-CM | POA: Diagnosis present

## 2020-05-01 DIAGNOSIS — E1151 Type 2 diabetes mellitus with diabetic peripheral angiopathy without gangrene: Secondary | ICD-10-CM | POA: Diagnosis not present

## 2020-05-01 DIAGNOSIS — J9811 Atelectasis: Secondary | ICD-10-CM | POA: Diagnosis present

## 2020-05-01 DIAGNOSIS — M1A9XX1 Chronic gout, unspecified, with tophus (tophi): Secondary | ICD-10-CM | POA: Diagnosis present

## 2020-05-01 DIAGNOSIS — Z20822 Contact with and (suspected) exposure to covid-19: Secondary | ICD-10-CM | POA: Diagnosis present

## 2020-05-01 DIAGNOSIS — Z8052 Family history of malignant neoplasm of bladder: Secondary | ICD-10-CM

## 2020-05-01 DIAGNOSIS — Z833 Family history of diabetes mellitus: Secondary | ICD-10-CM

## 2020-05-01 DIAGNOSIS — R531 Weakness: Secondary | ICD-10-CM | POA: Diagnosis not present

## 2020-05-01 DIAGNOSIS — J440 Chronic obstructive pulmonary disease with acute lower respiratory infection: Secondary | ICD-10-CM | POA: Diagnosis not present

## 2020-05-01 DIAGNOSIS — I4891 Unspecified atrial fibrillation: Secondary | ICD-10-CM | POA: Diagnosis not present

## 2020-05-01 DIAGNOSIS — J189 Pneumonia, unspecified organism: Secondary | ICD-10-CM | POA: Diagnosis not present

## 2020-05-01 DIAGNOSIS — J9600 Acute respiratory failure, unspecified whether with hypoxia or hypercapnia: Secondary | ICD-10-CM

## 2020-05-01 DIAGNOSIS — R270 Ataxia, unspecified: Secondary | ICD-10-CM | POA: Diagnosis not present

## 2020-05-01 DIAGNOSIS — Z87891 Personal history of nicotine dependence: Secondary | ICD-10-CM

## 2020-05-01 DIAGNOSIS — E7800 Pure hypercholesterolemia, unspecified: Secondary | ICD-10-CM | POA: Diagnosis present

## 2020-05-01 DIAGNOSIS — R001 Bradycardia, unspecified: Secondary | ICD-10-CM | POA: Diagnosis present

## 2020-05-01 DIAGNOSIS — R918 Other nonspecific abnormal finding of lung field: Secondary | ICD-10-CM | POA: Diagnosis not present

## 2020-05-01 DIAGNOSIS — D649 Anemia, unspecified: Secondary | ICD-10-CM | POA: Diagnosis not present

## 2020-05-01 HISTORY — DX: Cerebral infarction, unspecified: I63.9

## 2020-05-01 LAB — DIFFERENTIAL
Abs Immature Granulocytes: 0.04 10*3/uL (ref 0.00–0.07)
Basophils Absolute: 0 10*3/uL (ref 0.0–0.1)
Basophils Relative: 0 %
Eosinophils Absolute: 0.1 10*3/uL (ref 0.0–0.5)
Eosinophils Relative: 2 %
Immature Granulocytes: 1 %
Lymphocytes Relative: 13 %
Lymphs Abs: 1 10*3/uL (ref 0.7–4.0)
Monocytes Absolute: 0.7 10*3/uL (ref 0.1–1.0)
Monocytes Relative: 9 %
Neutro Abs: 5.9 10*3/uL (ref 1.7–7.7)
Neutrophils Relative %: 75 %

## 2020-05-01 LAB — CBC
HCT: 26.5 % — ABNORMAL LOW (ref 39.0–52.0)
Hemoglobin: 8.6 g/dL — ABNORMAL LOW (ref 13.0–17.0)
MCH: 31.3 pg (ref 26.0–34.0)
MCHC: 32.5 g/dL (ref 30.0–36.0)
MCV: 96.4 fL (ref 80.0–100.0)
Platelets: 211 10*3/uL (ref 150–400)
RBC: 2.75 MIL/uL — ABNORMAL LOW (ref 4.22–5.81)
RDW: 15.7 % — ABNORMAL HIGH (ref 11.5–15.5)
WBC: 7.8 10*3/uL (ref 4.0–10.5)
nRBC: 0 % (ref 0.0–0.2)

## 2020-05-01 LAB — COMPREHENSIVE METABOLIC PANEL
ALT: 20 U/L (ref 0–44)
AST: 23 U/L (ref 15–41)
Albumin: 3.7 g/dL (ref 3.5–5.0)
Alkaline Phosphatase: 60 U/L (ref 38–126)
Anion gap: 14 (ref 5–15)
BUN: 40 mg/dL — ABNORMAL HIGH (ref 8–23)
CO2: 18 mmol/L — ABNORMAL LOW (ref 22–32)
Calcium: 9.1 mg/dL (ref 8.9–10.3)
Chloride: 104 mmol/L (ref 98–111)
Creatinine, Ser: 1.79 mg/dL — ABNORMAL HIGH (ref 0.61–1.24)
GFR calc Af Amer: 42 mL/min — ABNORMAL LOW (ref >60)
GFR calc non Af Amer: 36 mL/min — ABNORMAL LOW (ref >60)
Glucose, Bld: 174 mg/dL — ABNORMAL HIGH (ref 70–99)
Potassium: 4 mmol/L (ref 3.5–5.1)
Sodium: 136 mmol/L (ref 135–145)
Total Bilirubin: 1 mg/dL (ref 0.3–1.2)
Total Protein: 7.2 g/dL (ref 6.5–8.1)

## 2020-05-01 LAB — CBG MONITORING, ED: Glucose-Capillary: 108 mg/dL — ABNORMAL HIGH (ref 70–99)

## 2020-05-01 LAB — ETHANOL: Alcohol, Ethyl (B): <10 mg/dL (ref <10)

## 2020-05-01 LAB — TSH: TSH: 5.072 u[IU]/mL — ABNORMAL HIGH (ref 0.350–4.500)

## 2020-05-01 LAB — PROTIME-INR
INR: 1.1 (ref 0.8–1.2)
Prothrombin Time: 13.7 seconds (ref 11.4–15.2)

## 2020-05-01 LAB — SARS CORONAVIRUS 2 BY RT PCR (HOSPITAL ORDER, PERFORMED IN ~~LOC~~ HOSPITAL LAB): SARS Coronavirus 2: NEGATIVE

## 2020-05-01 LAB — MAGNESIUM: Magnesium: 1.8 mg/dL (ref 1.7–2.4)

## 2020-05-01 LAB — APTT: aPTT: 29 seconds (ref 24–36)

## 2020-05-01 MED ORDER — HYDRALAZINE HCL 20 MG/ML IJ SOLN: 10.0000 mg | INTRAMUSCULAR | Status: DC | PRN

## 2020-05-01 MED ORDER — SODIUM CHLORIDE 0.9 % IV SOLN: INTRAVENOUS | Status: AC

## 2020-05-01 MED ORDER — CLOPIDOGREL BISULFATE 75 MG PO TABS
75.0000 mg | ORAL_TABLET | Freq: Every day | ORAL | Status: DC
  Administered 2020-05-02 – 2020-05-17 (×16): 75 mg via ORAL
  Filled 2020-05-01 (×17): qty 1

## 2020-05-01 MED ORDER — ACETAMINOPHEN 325 MG PO TABS
650.0000 mg | ORAL_TABLET | Freq: Four times a day (QID) | ORAL | Status: DC | PRN
  Administered 2020-05-02 – 2020-05-09 (×3): 650 mg via ORAL
  Filled 2020-05-01 (×3): qty 2

## 2020-05-01 MED ORDER — ATORVASTATIN CALCIUM 20 MG PO TABS
20.0000 mg | ORAL_TABLET | Freq: Every day | ORAL | Status: DC
  Administered 2020-05-02 – 2020-05-04 (×3): 20 mg via ORAL
  Filled 2020-05-01: qty 2
  Filled 2020-05-01 (×2): qty 1

## 2020-05-01 MED ORDER — ASPIRIN 81 MG PO CHEW
81.0000 mg | CHEWABLE_TABLET | Freq: Every day | ORAL | Status: DC
  Administered 2020-05-02 – 2020-05-03 (×2): 81 mg via ORAL
  Filled 2020-05-01 (×2): qty 1

## 2020-05-01 MED ORDER — INSULIN ASPART 100 UNIT/ML ~~LOC~~ SOLN
0.0000 [IU] | Freq: Every day | SUBCUTANEOUS | Status: DC
  Administered 2020-05-10: 2 [IU] via SUBCUTANEOUS

## 2020-05-01 MED ORDER — HEPARIN SODIUM (PORCINE) 5000 UNIT/ML IJ SOLN
5000.0000 [IU] | Freq: Three times a day (TID) | INTRAMUSCULAR | Status: DC
  Administered 2020-05-01 – 2020-05-03 (×6): 5000 [IU] via SUBCUTANEOUS
  Filled 2020-05-01 (×6): qty 1

## 2020-05-01 MED ORDER — ACETAMINOPHEN 650 MG RE SUPP
650.0000 mg | Freq: Four times a day (QID) | RECTAL | Status: DC | PRN
  Administered 2020-05-04 – 2020-05-05 (×2): 650 mg via RECTAL
  Filled 2020-05-01 (×4): qty 1

## 2020-05-01 MED ORDER — ALLOPURINOL 100 MG PO TABS
100.0000 mg | ORAL_TABLET | Freq: Two times a day (BID) | ORAL | Status: DC
  Administered 2020-05-02 – 2020-05-17 (×23): 100 mg via ORAL
  Filled 2020-05-01 (×30): qty 1

## 2020-05-01 MED ORDER — POLYETHYLENE GLYCOL 3350 17 G PO PACK
8.5000 g | PACK | Freq: Every day | ORAL | Status: DC
  Administered 2020-05-04 – 2020-05-08 (×4): 8.5 g via ORAL
  Filled 2020-05-01 (×14): qty 1

## 2020-05-01 MED ORDER — INSULIN ASPART 100 UNIT/ML ~~LOC~~ SOLN
0.0000 [IU] | Freq: Three times a day (TID) | SUBCUTANEOUS | Status: DC
  Administered 2020-05-02 – 2020-05-04 (×5): 1 [IU] via SUBCUTANEOUS
  Administered 2020-05-04: 2 [IU] via SUBCUTANEOUS
  Administered 2020-05-05 – 2020-05-07 (×4): 1 [IU] via SUBCUTANEOUS
  Administered 2020-05-08: 2 [IU] via SUBCUTANEOUS
  Administered 2020-05-08 – 2020-05-09 (×4): 1 [IU] via SUBCUTANEOUS
  Administered 2020-05-10: 2 [IU] via SUBCUTANEOUS
  Administered 2020-05-10 – 2020-05-12 (×7): 1 [IU] via SUBCUTANEOUS
  Administered 2020-05-13: 3 [IU] via SUBCUTANEOUS
  Administered 2020-05-13 – 2020-05-14 (×3): 1 [IU] via SUBCUTANEOUS
  Administered 2020-05-14: 2 [IU] via SUBCUTANEOUS
  Administered 2020-05-14: 1 [IU] via SUBCUTANEOUS
  Administered 2020-05-15: 2 [IU] via SUBCUTANEOUS
  Administered 2020-05-15 (×2): 1 [IU] via SUBCUTANEOUS
  Administered 2020-05-16: 2 [IU] via SUBCUTANEOUS
  Administered 2020-05-17: 1 [IU] via SUBCUTANEOUS

## 2020-05-01 NOTE — ED Notes (Signed)
Dr Emokpae at bedside.  

## 2020-05-01 NOTE — Consult Note (Signed)
[facility] Tele[facility]urology Consult Services  Stat Consult  Date of Service:   05/01/2020 18:15:30  Impression:     .  I63.9 - Cerebrovascular accident (CVA), unspecified mechanism ([place])  Comments/Sign-Out: 74 year old male with vascular risk factors including peripheral arterial disease, hypertension, diabetes, who underwent iliac stenting last week, who began having a left hemifield visual deficit, and CT head shows evidence of an acute infarct in the mesial right occipital lobe. No TPA or thrombectomy due to presentation outside the window and establish infarct on CT. Recommend admission for stroke work-up. Stroke mechanism is likely perioperative, need to rule out things such as an LV thrombus as a potential mechanism. Recommend admission for MRI imaging, TTE, stroke labs. Continue dual antiplatelet therapy for now.  CT HEAD: Reviewed Right acute medial occipital lobe infarct.  Metrics: [facility] Notification Time: 05/01/2020 18:14:54 Stamp Time: 05/01/2020 18:15:30 Callback Response Time: 05/01/2020 18:23:23  Our recommendations are outlined below.  Recommendations:     Marland Kitchen  MRI brain without IV contrast     .  MRA head/neck     .  TSH, A1c, lipid profile     .  Transthoracic echocardiogram     .  Continuous telemetry     .  Physical, occupational, and speech therapies     .  q4h neuro checks/NIHSS     .  NPO until bedside swallow     .  Neurology follow-up   Disposition: Neurology Follow Up Recommended  Sign Out:     .  Discussed with Emergency Department Provider  ----------------------------------------------------------------------------------------------------  Chief Complaint: Stroke  History of Present Illness: Patient is a 74 year old Male.  This is a 74 year old gentleman with a history of peripheral arterial disease, diabetes, hypertension, currently on aspirin and Plavix. He recently underwent vascular stenting of his iliac  arteries last week. Yesterday, he noticed problems seeing out of the left side of his vision that was apparent when he woke up in the morning. He did not think much of it. However, symptoms persisted today. Therefore, he came to the emergency room for evaluation. CT head without contrast showed an evolving acute infarct in the medial right occipital lobe. He has no history of strokes or TIAs in the past. He is on aspirin and Plavix but no blood thinners.   Past Medical History:     . Hypertension     . Diabetes Mellitus   Antiplatelet use: asa, plavix    Examination: BP(127/46), Pulse(49), Blood Glucose(174) 1A: Level of Consciousness - Alert; keenly responsive + 0 1B: Ask Month and Age - Both Questions Right + 0 1C: Blink Eyes & Squeeze Hands - Performs Both Tasks + 0 2: Test Horizontal Extraocular Movements - Normal + 0 3: Test Visual Fields - Complete Hemianopia + 2 4: Test Facial Palsy (Use Grimace if Obtunded) - Normal symmetry + 0 5A: Test Left Arm Motor Drift - No Drift for 10 Seconds + 0 5B: Test Right Arm Motor Drift - No Drift for 10 Seconds + 0 6A: Test Left Leg Motor Drift - No Drift for 5 Seconds + 0 6B: Test Right Leg Motor Drift - No Drift for 5 Seconds + 0 7: Test Limb Ataxia (FNF/Heel-Shin) - No Ataxia + 0 8: Test Sensation - Normal; No sensory loss + 0 9: Test Language/Aphasia - Normal; No aphasia + 0 10: Test Dysarthria - Normal + 0 11: Test Extinction/Inattention - No abnormality + 0  NIHSS Score: 2    Patient is  being evaluated for possible acute neurologic impairment and high probability of imminent or life-threatening deterioration. I spent total of 35 minutes providing care to this patient, including time for face to face visit via telemedicine, review of medical records, imaging studies and discussion of findings with providers, the patient and/or family.   Dr Knox Royalty   [facility] [phone]  Case [id number]

## 2020-05-01 NOTE — ED Notes (Signed)
Pt given bag meal provided by [facility].

## 2020-05-01 NOTE — ED Triage Notes (Signed)
Pt to er, pt states that his last know well was Friday night, states that he has lost vision in his L eye, states that it is a dark cloud on his L side.  MD evaluating pt.  Pt states that he woke up Saturday am with the vision loss, pt reports that he had some tingling in his L arm today.

## 2020-05-01 NOTE — ED Provider Notes (Signed)
[facility] EMERGENCY DEPARTMENT Provider Note   CSN: [id number] Arrival date & time: 05/01/20  1630     History Chief Complaint  Patient presents with  . Eye Problem  . Loss of Vision    Connor Bryant is a 74 y.o. male history of hypertension, hyperlipidemia, present emergency department with left-sided vision loss.  He reports onset of symptoms 2 days ago when he woke up in the morning.  He says he feels sees a dark cloud in the left side of the vision of his left eye only.  He denies any pain in his eye.  He denies any headache.  He questions whether some tingling in his left arm today but denies any weakness in his arm.  He has never had eye surgery.  He wears prescription lenses.  He has no other vision issues.  He takes aspirin daily 81 mg.  He was started on Plavix 4 days ago after his iliac stent surgery and takes that daily.  He denies smoking.  He reports a history of high blood pressure, high cholesterol, diabetes.  He denies any prior history of stroke or TIA.  Patient did have a history of iliac stents placed bilaterally 5 days ago.  He also is a history of mesenteric occlusion status post bypass.  HPI     Past Medical History:  Diagnosis Date  . Arthritis   . DM type 2 (diabetes mellitus, type 2) (HCC)    no meds  . Gallstone   . Heart murmur    was told one time he had a murmur, not since 30 years ago  . HTN (hypertension)   . Hyperlipidemia   . Personal history of colonic polyp-adenoma 11/24/2013  . Renal insufficiency    mild  . Thoracic spine fracture [facility])     Patient Active Problem List   Diagnosis Date Noted  . Acute CVA (cerebrovascular accident) ([place]) 05/01/2020  . S/P insertion of iliac artery stent 05/01/2020  . Compression fracture of T12 vertebra ([place]) 10/27/2018  . Spinal stenosis of lumbosacral region 10/27/2018  . Small bowel obstruction ([place]) 09/30/2018  . Back pain of lumbar region with sciatica 08/26/2018  . Chronic left-sided low  back pain with left-sided sciatica 08/26/2018  . Tremor 08/26/2018  . Pure hypercholesterolemia 02/25/2018  . Atherosclerosis of aorta ([place]) 02/24/2018  . Bilateral hip pain 06/24/2017  . Body mass index 30.0-30.9, adult 06/06/2016  . Gout 06/06/2016  . Essential hypertension 06/06/2016  . Type 2 diabetes mellitus without complication, without long-term current use of insulin ([place]) 06/06/2016  . Chronic mesenteric ischemia ([place]) 03/01/2014  . Peripheral vascular disease ([place]) 02/16/2014  . 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 Personal history of colonic polyp-adenoma 11/24/2013  . Diarrhea 10/21/2013    Past Surgical History:  Procedure Laterality Date  . ABDOMINAL AORTAGRAM N/A 02/23/2014   Procedure: ABDOMINAL [name];  Surgeon: Conrad Lorton, MD;  Location: [facility] CATH LAB;  Service: Cardiovascular;  Laterality: N/A;  . COLONOSCOPY    . DEBRIDEMENT TENNIS ELBOW Right   . ESOPHAGOGASTRODUODENOSCOPY    . MESENTERIC ARTERY BYPASS N/A 03/10/2014   Procedure: AORTO TO SUPERIOR MESENTERIC ARTERY BYPASS;  Surgeon: [name], MD;  Location: [facility];  Service: Vascular;  Laterality: N/A;  . TONSILLECTOMY AND ADENOIDECTOMY    . vascualr surgery     illiac stents       Family History  Problem Relation Age of Onset  . Hypertension Father   .  Coronary artery disease Father   . Hyperlipidemia Father   . Diabetes Father   . Bladder Cancer Father   . Cancer Father   . Heart disease Father   . Heart attack Father   . Diabetes Mother   . Heart disease Mother        before age 52  . Hyperlipidemia Mother   . Hypertension Mother   . AAA (abdominal aortic aneurysm) Mother   . Colon cancer Neg Hx   . Throat cancer Neg Hx   . Kidney disease Neg Hx   . Liver disease Neg Hx     Social History   Tobacco Use  . Smoking status: Former Smoker    Types: Cigarettes    Quit date: 07/08/2009    Years since quitting: 10.8  . Smokeless tobacco: Never Used    Vaping Use  . Vaping Use: Never used  Substance Use Topics  . Alcohol use: Yes    Alcohol/week: 12.0 standard drinks    Types: 12 Cans of beer per week    Comment: 8 beers a week  . Drug use: Not Currently    Home Medications Prior to Admission medications   Medication Sig Start Date End Date Taking? Authorizing Provider  allopurinol (ZYLOPRIM) 100 MG tablet TAKE 1 TABLET 2 TO 3 TIMES A DAY FOR GOUT 02/29/20  Yes Claretta Fraise, MD  aspirin 81 MG tablet Take 81 mg by mouth daily.   Yes [provider]  atorvastatin (LIPITOR) 20 MG tablet Take 1 tablet (20 mg total) by mouth daily. 02/29/20  Yes Stacks, Cletus Gash, MD  clopidogrel (PLAVIX) 75 MG tablet Take 75 mg by mouth daily. 04/27/20  Yes [provider]  hydrALAZINE (APRESOLINE) 100 MG tablet Take 100 mg by mouth 3 (three) times daily. 03/03/20  Yes [provider]  hydrochlorothiazide (HYDRODIURIL) 25 MG tablet TAKE 1 OR 2 TABLETS DAILY IN THE MORNING AS NEEDED 02/29/20  Yes Stacks, Cletus Gash, MD  hydrochlorothiazide (HYDRODIURIL) 25 MG tablet Take 12.5 mg by mouth daily. 02/29/20  Yes [provider]  loratadine (CLARITIN) 10 MG tablet Take 1 tablet (10 mg total) by mouth daily. 08/27/19  Yes Terald Sleeper, PA-C  metFORMIN (GLUCOPHAGE-XR) 500 MG 24 hr tablet 2 tablet with evening meal Once a day 02/29/20  Yes Stacks, Cletus Gash, MD  metoprolol (TOPROL-XL) 200 MG 24 hr tablet Take 1 tablet (200 mg total) by mouth daily. 02/29/20  Yes Stacks, Cletus Gash, MD  pioglitazone (ACTOS) 30 MG tablet Take 1 tablet (30 mg total) by mouth daily. 02/29/20  Yes Stacks, Cletus Gash, MD  polyethylene glycol (MIRALAX / GLYCOLAX) packet Take 17 g by mouth daily. Patient taking differently: Take 8.5 g by mouth daily.  10/02/18  Yes Gherghe, Vella Redhead, MD  trandolapril (MAVIK) 4 MG tablet Take 1 tablet (4 mg total) by mouth daily. 02/29/20  Yes Claretta Fraise, MD  verapamil (CALAN-SR) 240 MG CR tablet Take 2 tablets (480 mg total) by mouth daily. 02/29/20  Yes  Claretta Fraise, MD  hydrALAZINE (APRESOLINE) 50 MG tablet Take 1 tablet (50 mg total) by mouth 3 (three) times daily. 02/29/20   Claretta Fraise, MD    Allergies    Patient has no known allergies.  Review of Systems   Review of Systems  Constitutional: Negative for chills and fever.  HENT: Negative for ear pain and sore throat.   Eyes: Positive for visual disturbance. Negative for pain.  Respiratory: Negative for cough and shortness of breath.   Cardiovascular:  Negative for chest pain and palpitations.  Gastrointestinal: Negative for abdominal pain, nausea and vomiting.  Genitourinary: Negative for dysuria and hematuria.  Musculoskeletal: Negative for arthralgias and back pain.  Skin: Negative for color change and rash.  Neurological: Positive for numbness. Negative for weakness, light-headedness and headaches.  Psychiatric/Behavioral: Negative for agitation and confusion.  All other systems reviewed and are negative.   Physical Exam Updated Vital Signs BP (!) 153/57   Pulse 63   Temp 98.9 F (37.2 C) (Oral)   Resp 17   Ht 5\' 8"  (1.727 m)   Wt 103.4 kg   SpO2 94%   BMI 34.67 kg/m   Physical Exam Vitals and nursing note reviewed.  Constitutional:      Appearance: He is well-developed.  HENT:     Head: Normocephalic and atraumatic.  Eyes:     General: Visual field deficit present.     Extraocular Movements: Extraocular movements intact.     Conjunctiva/sclera: Conjunctivae normal.     Pupils: Pupils are equal, round, and reactive to light.  Cardiovascular:     Rate and Rhythm: Normal rate and regular rhythm.     Pulses: Normal pulses.  Pulmonary:     Effort: Pulmonary effort is normal. No respiratory distress.     Breath sounds: Normal breath sounds.  Abdominal:     Palpations: Abdomen is soft.     Tenderness: There is no abdominal tenderness.  Musculoskeletal:     Cervical back: Neck supple.  Skin:    General: Skin is warm and dry.  Neurological:     Mental  Status: He is alert and oriented to person, place, and time. Mental status is at baseline.     GCS: GCS eye subscore is 4. GCS verbal subscore is 5. GCS motor subscore is 6.     Cranial Nerves: No dysarthria or facial asymmetry.     Sensory: No sensory deficit.     Motor: No weakness.     Coordination: Coordination is intact.     Gait: Gait is intact.     Comments: Left sided homonymous hemianposia  Psychiatric:        Mood and Affect: Mood normal.        Behavior: Behavior normal.     ED Results / Procedures / Treatments   Labs (all labs ordered are listed, but only abnormal results are displayed) Labs Reviewed  CBC - Abnormal; Notable for the following components:      Result Value   RBC 2.75 (*)    Hemoglobin 8.6 (*)    HCT 26.5 (*)    RDW 15.7 (*)    All other components within normal limits  COMPREHENSIVE METABOLIC PANEL - Abnormal; Notable for the following components:   CO2 18 (*)    Glucose, Bld 174 (*)    BUN 40 (*)    Creatinine, Ser 1.79 (*)    GFR calc non Af Amer 36 (*)    GFR calc Af Amer 42 (*)    All other components within normal limits  TSH - Abnormal; Notable for the following components:   TSH 5.072 (*)    All other components within normal limits  CBG MONITORING, ED - Abnormal; Notable for the following components:   Glucose-Capillary 108 (*)    All other components within normal limits  SARS CORONAVIRUS 2 BY RT PCR ([facility])  ETHANOL  PROTIME-INR  APTT  DIFFERENTIAL  MAGNESIUM  RAPID URINE DRUG  SCREEN, HOSP PERFORMED  BASIC METABOLIC PANEL  CBC  LIPID PANEL  FERRITIN  IRON AND TIBC  I-STAT CHEM 8, ED    EKG EKG Interpretation  Date/Time:  Sunday May 01 2020 16:55:40 EDT Ventricular Rate:  59 PR Interval:    QRS Duration: 103 QT Interval:  499 QTC Calculation: 495 R Axis:   51 Text Interpretation: Atrial fibrillation Borderline ST depression, diffuse leads Borderline prolonged QT  interval No STEMI Confirmed by Octaviano Glow [phone]) on 05/01/2020 5:47:32 PM   Radiology CT Head Wo Contrast  Result Date: 05/01/2020 CLINICAL DATA:  Left side hemi anopsia, left arm paresthesias. EXAM: CT HEAD WITHOUT CONTRAST TECHNIQUE: Contiguous axial images were obtained from the base of the skull through the vertex without intravenous contrast. COMPARISON:  None. FINDINGS: Brain: Area of low-density noted in the right occipital lobe compatible with acute infarction. No other areas of infarct. Diffuse cerebral atrophy. No hydrocephalus. No hemorrhage. Vascular: No hyperdense vessel or unexpected calcification. Skull: No acute calvarial abnormality. Sinuses/Orbits: Visualized paranasal sinuses and mastoids clear. Orbital soft tissues unremarkable. Other: None IMPRESSION: Acute right occipital infarct. Electronically Signed   By: Rolm Baptise M.D.   On: 05/01/2020 17:24    Procedures Procedures (including critical care time)  Medications Ordered in ED Medications  heparin injection 5,000 Units (5,000 Units Subcutaneous Given 05/01/20 2154)  acetaminophen (TYLENOL) tablet 650 mg (has no administration in time range)    Or  acetaminophen (TYLENOL) suppository 650 mg (has no administration in time range)  insulin aspart (novoLOG) injection 0-9 Units (has no administration in time range)  insulin aspart (novoLOG) injection 0-5 Units (0 Units Subcutaneous Not Given 05/01/20 2153)  0.9 %  sodium chloride infusion ( Intravenous New Bag/Given 05/01/20 2152)  hydrALAZINE (APRESOLINE) injection 10 mg (has no administration in time range)  allopurinol (ZYLOPRIM) tablet 100 mg (100 mg Oral Not Given 05/01/20 2327)  aspirin chewable tablet 81 mg (has no administration in time range)  atorvastatin (LIPITOR) tablet 20 mg (has no administration in time range)  clopidogrel (PLAVIX) tablet 75 mg (has no administration in time range)  polyethylene glycol (MIRALAX / GLYCOLAX) packet 8.5 g (has no administration in  time range)    ED Course  I have reviewed the triage vital signs and the nursing notes.  Pertinent labs & imaging results that were available during my care of the patient were reviewed by me and considered in my medical decision making (see chart for details).  74 year old who presented to emergency department with left-sided hemianopsia for 2 days.  This was an abrupt change in his vision.  He does have a significant vascular history including iliac occlusions and mesenteric occlusions.  He has been taking 81 mg aspirin as well as Plavix.  He reports paresthesias in his right arm earlier today but has no objective sensory neuro deficits on my exam.  His exam is notable again for left sided homonymous hemianopsia.  He is outside the window for stroke alert.  CT scan here showed an acute occipital infarct consistent with his symptoms.   Plan to consult with neurology admit the patient to hospital for further work-up.  I personally reviewed the patient's EKG which shows no acute ischemia.  I personally reviewed his labs with a CMP near his baseline, with creatinine of 1.8.  The patient was updated about his diagnosis well as a son at bedside.  Clinical Course as of May 02 112  Sun May 01, 2020  1747 FINDINGS: Brain: Area of  low-density noted in the right occipital lobe compatible with acute infarction. No other areas of infarct. Diffuse cerebral atrophy. No hydrocephalus. No hemorrhage.   [MT]  1829 Neuro consult placed, awaiting evaluation - will admit to hospitalist for stroke w/u   [MT]    Clinical Course User Index [MT] Elliot Simoneaux, Carola Rhine, MD    Final Clinical Impression(s) / ED Diagnoses Final diagnoses:  Cerebrovascular accident (CVA), unspecified mechanism ([place])  Homonymous hemianopia, left    Rx / DC Orders ED Discharge Orders    None       Riggins Cisek, Carola Rhine, MD 05/02/20 [phone]

## 2020-05-01 NOTE — ED Notes (Signed)
Connor Bryant [facility] made aware of request for hospital bed for this pt- food requested also

## 2020-05-01 NOTE — H&P (Signed)
History and Physical    Connor Bryant MPN:[id number] DOB: [birth year] DOA: 05/01/2020  PCP: Claretta Fraise, MD   Patient coming from: Home  I have personally briefly reviewed patient's old medical records in [place]  Chief Complaint: Left eye vision problems.  HPI: Connor Bryant is a 74 y.o. male with medical history significant for recent iliac stents, diabetes mellitus, hypertension. Patient presented to the ED today with complaints of losing vision in his left eye, started yesterday.  Patient woke up yesterday- Saturday morning with this, and this persisted today.  Reports it feels like a dark shadow on the peripheral side of his left eye. He also reports some tingling in his left arm-this has resolved.  No strokes.  He denies weakness of his extremities.  No change in speech, no facial asymmetry.  Patient took his aspirin and Plavix and statins today. Patient had bilateral iliac stents, 2 on the right, one on the left placed about 5 days ago at [facility].  The only cardiac history patient is aware of is a cardiac murmur.  He is not aware of history of irregular heart rhythm.  ED Course: Blood pressure systolic 1 1 5-1 2 7, heart rate 49-59, hemoglobin 8.6, recent baseline about 11.  Creatinine 1.7, about baseline.  Head CT shows acute right occipital infarct.  EKG showing atrial fibrillation.  Telemetry neurology consulted-patient outside TPA window, stroke work-up.  Dual antiplatelet for now.  Hospitalist to admit for acute CVA.  Review of Systems: As per HPI all other systems reviewed and negative.  Past Medical History:  Diagnosis Date  . Arthritis   . DM type 2 (diabetes mellitus, type 2) (HCC)    no meds  . Gallstone   . Heart murmur    was told one time he had a murmur, not since 30 years ago  . HTN (hypertension)   . Hyperlipidemia   . Personal history of colonic polyp-adenoma 11/24/2013  . Renal insufficiency    mild  . Thoracic spine fracture  [facility])     Past Surgical History:  Procedure Laterality Date  . ABDOMINAL AORTAGRAM N/A 02/23/2014   Procedure: ABDOMINAL [name];  Surgeon: Conrad Valdez, MD;  Location: [facility] CATH LAB;  Service: Cardiovascular;  Laterality: N/A;  . COLONOSCOPY    . DEBRIDEMENT TENNIS ELBOW Right   . ESOPHAGOGASTRODUODENOSCOPY    . MESENTERIC ARTERY BYPASS N/A 03/10/2014   Procedure: AORTO TO SUPERIOR MESENTERIC ARTERY BYPASS;  Surgeon: [name], MD;  Location: [place];  Service: Vascular;  Laterality: N/A;  . TONSILLECTOMY AND ADENOIDECTOMY    . vascualr surgery     illiac stents     reports that he quit smoking about 10 years ago. His smoking use included cigarettes. He has never used smokeless tobacco. He reports current alcohol use of about 12.0 standard drinks of alcohol per week. He reports previous drug use.  No Known Allergies  Family History  Problem Relation Age of Onset  . Hypertension Father   . Coronary artery disease Father   . Hyperlipidemia Father   . Diabetes Father   . Bladder Cancer Father   . Cancer Father   . Heart disease Father   . Heart attack Father   . Diabetes Mother   . Heart disease Mother        before age 65  . Hyperlipidemia Mother   . Hypertension Mother   . AAA (abdominal aortic aneurysm) Mother   . Colon  cancer Neg Hx   . Throat cancer Neg Hx   . Kidney disease Neg Hx   . Liver disease Neg Hx     Prior to Admission medications   Medication Sig Start Date End Date Taking? Authorizing Provider  allopurinol (ZYLOPRIM) 100 MG tablet TAKE 1 TABLET 2 TO 3 TIMES A DAY FOR GOUT 02/29/20   Claretta Fraise, MD  aspirin 81 MG tablet Take 81 mg by mouth daily.    [provider]  atorvastatin (LIPITOR) 20 MG tablet Take 1 tablet (20 mg total) by mouth daily. 02/29/20   Claretta Fraise, MD  hydrALAZINE (APRESOLINE) 50 MG tablet Take 1 tablet (50 mg total) by mouth 3 (three) times daily. 02/29/20   Claretta Fraise, MD  hydrochlorothiazide (HYDRODIURIL) 25 MG tablet  TAKE 1 OR 2 TABLETS DAILY IN THE MORNING AS NEEDED 02/29/20   Claretta Fraise, MD  loratadine (CLARITIN) 10 MG tablet Take 1 tablet (10 mg total) by mouth daily. 08/27/19   Terald Sleeper, PA-C  metFORMIN (GLUCOPHAGE-XR) 500 MG 24 hr tablet 2 tablet with evening meal Once a day 02/29/20   Claretta Fraise, MD  metoprolol (TOPROL-XL) 200 MG 24 hr tablet Take 1 tablet (200 mg total) by mouth daily. 02/29/20   Claretta Fraise, MD  pioglitazone (ACTOS) 30 MG tablet Take 1 tablet (30 mg total) by mouth daily. 02/29/20   Claretta Fraise, MD  polyethylene glycol (MIRALAX / Floria Raveling) packet Take 17 g by mouth daily. Patient taking differently: Take 8.5 g by mouth daily.  10/02/18   Caren Griffins, MD  trandolapril (MAVIK) 4 MG tablet Take 1 tablet (4 mg total) by mouth daily. 02/29/20   Claretta Fraise, MD  verapamil (CALAN-SR) 240 MG CR tablet Take 2 tablets (480 mg total) by mouth daily. 02/29/20   Claretta Fraise, MD    Physical Exam: Vitals:   05/01/20 1701 05/01/20 1731 05/01/20 1800 05/01/20 1832  BP: (!) 118/52 (!) 115/47 127/71 (!) 127/46  Pulse: (!) 57  (!) 53 (!) 49  Resp: (!) 24 (!) 22 20 20   Temp:      TempSrc:      SpO2: 93%  95% 94%  Weight:      Height:        Constitutional: NAD, calm, comfortable Vitals:   05/01/20 1701 05/01/20 1731 05/01/20 1800 05/01/20 1832  BP: (!) 118/52 (!) 115/47 127/71 (!) 127/46  Pulse: (!) 57  (!) 53 (!) 49  Resp: (!) 24 (!) 22 20 20   Temp:      TempSrc:      SpO2: 93%  95% 94%  Weight:      Height:       Eyes: PERRL, lids and conjunctivae normal ENMT: Mucous membranes are moist.  Neck: normal, supple, no masses, no thyromegaly Respiratory: clear to auscultation bilaterally, no wheezing, no crackles. Normal respiratory effort. No accessory muscle use.  Cardiovascular: 3/6 systolic murmur, known, at the time of my evaluation, telemetry appears to be showing P waves, trace bilateral extremity edema. 2+ pedal pulses.   Abdomen: no tenderness, no masses  palpated. No hepatosplenomegaly. Bowel sounds positive.  Musculoskeletal: no clubbing / cyanosis. No joint deformity upper and lower extremities. Good ROM, no contractures. Normal muscle tone.  Skin: no rashes, lesions, ulcers. No induration Neurologic: Left eye lateral hemianopsia, right eye vision intact normal.  No ptosis. Speech fluent and coherent, no facial asymmetry.  5/5 strength bilateral upper and lower extremities.  Sensation intact in bilateral upper and lower  extremities. Psychiatric: Normal judgment and insight. Alert and oriented x 3. Normal mood.   Labs on Admission: I have personally reviewed following labs and imaging studies  CBC: Recent Labs  Lab 05/01/20 1702  WBC 7.8  NEUTROABS 5.9  HGB 8.6*  HCT 26.5*  MCV 96.4  PLT 950   Basic Metabolic Panel: Recent Labs  Lab 05/01/20 1702  NA 136  K 4.0  CL 104  CO2 18*  GLUCOSE 174*  BUN 40*  CREATININE 1.79*  CALCIUM 9.1   Liver Function Tests: Recent Labs  Lab 05/01/20 1702  AST 23  ALT 20  ALKPHOS 60  BILITOT 1.0  PROT 7.2  ALBUMIN 3.7   Coagulation Profile: Recent Labs  Lab 05/01/20 1702  INR 1.1    Radiological Exams on Admission: CT Head Wo Contrast  Result Date: 05/01/2020 CLINICAL DATA:  Left side hemi anopsia, left arm paresthesias. EXAM: CT HEAD WITHOUT CONTRAST TECHNIQUE: Contiguous axial images were obtained from the base of the skull through the vertex without intravenous contrast. COMPARISON:  None. FINDINGS: Brain: Area of low-density noted in the right occipital lobe compatible with acute infarction. No other areas of infarct. Diffuse cerebral atrophy. No hydrocephalus. No hemorrhage. Vascular: No hyperdense vessel or unexpected calcification. Skull: No acute calvarial abnormality. Sinuses/Orbits: Visualized paranasal sinuses and mastoids clear. Orbital soft tissues unremarkable. Other: None IMPRESSION: Acute right occipital infarct. Electronically Signed   By: Rolm Baptise M.D.   On:  05/01/2020 17:24    EKG: Independently reviewed.  Atrial fibrillation 59, QTc 495.  EKG shows sinus bradycardia.  Assessment/Plan Principal Problem:   Acute CVA (cerebrovascular accident) [facility]) Active Problems:   Gout   Essential hypertension   S/P insertion of iliac artery stent   Acute CVA-left vision loss with left paresthesias.  Head CT shows acute right occipital infarct.  Patient outside TPA window. - Telemetry neurology consult- stroke work-up, need to rule out LV thrombus as potential mechanism, aspirin and Plavix for now -Continue Lipitor -MRI/MRA head and neck -02/2020 Hemoglobin A1c-6.1 -Obtain lipid panel, TSH in the morning -Echocardiogram -Neuro checks - Passed swallow evaluation -PT OT evaluation -Hold antihypertensives, to allow for permissive hypertension- verapamil, trandolapril, metoprolol, HCTZ, and hydralazine in the setting of acute CVA -As needed hydralazine 10 mg for systolic > 932 - Neurology consult  Atrial fibrillation- heart rate mostly 50s, new compared to prior EKGs from 2015.  Per Care Everywhere was evaluated by cardiology 8/4, I do not see any mention of atrial fibrillation.  So it appears this is new. CHADs2VAsc score is high at 7 (now with a stroke, vascular disease, diabetes mellitus, hypertension, age). -Following Tele-neurology consult-  aspirin and Plavix for now -Obtain echocardiogram -Metoprolol and verapamil held for now in setting of acute CVA -Repeat EKG  Prolonged QTC-495.  Not new.  Potassium 4.  Does not appear to be on QT prolonging medications. -Check magnesium.  Recent iliac stents-at [facility], some details in care everywhere.  Patient had bilateral iliac stents placements 04/27/2020.    Acute on chronic kidney disease-creatinine 1.79, recent creatinine check 8/4 was 1.2. Likely 2/2 recent contrast exposure related to iliac artery stents,  with ACE and diuretic use. -IV fluids N/s 75cc/hr x 1 day - BMP a.m -Hold home  trandolapril, HCTZ  Acute anemia-hemoglobin 8.6, recent baseline about 11.  Likely postsurgical blood loss. -Iron studies in the morning. -CBC in the morning  Controlled diabetes mellitus-random glucose 174.  Recent hemoglobin A1c 6.1. -Hold home Metformin -  SSI- s  Hypertension-stable. -On multiple agents, for now hold home antihypertensives-hydralazine, HCTZ, metoprolol, trandolapril and verapamil to allow for permissive hypertension in the setting of acute CVA -IV hydralazine for systolic greater than 991.  Gout-resume allopurinol  DVT prophylaxis: Heparin Code Status: Full code Family Communication: None at bedside Disposition Plan: 1 - 2 days Consults called: neurology Admission status: Inpatient, tele  I certify that at the point of admission it is my clinical judgment that the patient will require inpatient hospital care spanning beyond 2 midnights from the point of admission due to high intensity of service, high risk for further deterioration and high frequency of surveillance required.    Bethena Roys MD [facility]  05/01/2020, 9:04 PM

## 2020-05-01 NOTE — ED Notes (Signed)
Teleneuro cart at bedside

## 2020-05-02 ENCOUNTER — Inpatient Hospital Stay (HOSPITAL_COMMUNITY): Payer: Medicare Other

## 2020-05-02 DIAGNOSIS — E119 Type 2 diabetes mellitus without complications: Secondary | ICD-10-CM | POA: Diagnosis not present

## 2020-05-02 DIAGNOSIS — I480 Paroxysmal atrial fibrillation: Secondary | ICD-10-CM

## 2020-05-02 DIAGNOSIS — I10 Essential (primary) hypertension: Secondary | ICD-10-CM

## 2020-05-02 DIAGNOSIS — H53462 Homonymous bilateral field defects, left side: Secondary | ICD-10-CM

## 2020-05-02 DIAGNOSIS — I6389 Other cerebral infarction: Secondary | ICD-10-CM

## 2020-05-02 DIAGNOSIS — I4891 Unspecified atrial fibrillation: Secondary | ICD-10-CM

## 2020-05-02 DIAGNOSIS — I639 Cerebral infarction, unspecified: Secondary | ICD-10-CM | POA: Diagnosis not present

## 2020-05-02 LAB — LIPID PANEL
Cholesterol: 126 mg/dL (ref 0–200)
HDL: 58 mg/dL (ref >40)
LDL Cholesterol: 54 mg/dL (ref 0–99)
Total CHOL/HDL Ratio: 2.2 RATIO
Triglycerides: 68 mg/dL (ref <150)
VLDL: 14 mg/dL (ref 0–40)

## 2020-05-02 LAB — CBC
HCT: 25.2 % — ABNORMAL LOW (ref 39.0–52.0)
Hemoglobin: 8 g/dL — ABNORMAL LOW (ref 13.0–17.0)
MCH: 31 pg (ref 26.0–34.0)
MCHC: 31.7 g/dL (ref 30.0–36.0)
MCV: 97.7 fL (ref 80.0–100.0)
Platelets: 176 10*3/uL (ref 150–400)
RBC: 2.58 MIL/uL — ABNORMAL LOW (ref 4.22–5.81)
RDW: 15.8 % — ABNORMAL HIGH (ref 11.5–15.5)
WBC: 5.2 10*3/uL (ref 4.0–10.5)
nRBC: 0 % (ref 0.0–0.2)

## 2020-05-02 LAB — IRON AND TIBC
Iron: 38 ug/dL — ABNORMAL LOW (ref 45–182)
Saturation Ratios: 12 % — ABNORMAL LOW (ref 17.9–39.5)
TIBC: 313 ug/dL (ref 250–450)
UIBC: 275 ug/dL

## 2020-05-02 LAB — BASIC METABOLIC PANEL
Anion gap: 11 (ref 5–15)
BUN: 36 mg/dL — ABNORMAL HIGH (ref 8–23)
CO2: 21 mmol/L — ABNORMAL LOW (ref 22–32)
Calcium: 8.8 mg/dL — ABNORMAL LOW (ref 8.9–10.3)
Chloride: 106 mmol/L (ref 98–111)
Creatinine, Ser: 1.54 mg/dL — ABNORMAL HIGH (ref 0.61–1.24)
GFR calc Af Amer: 50 mL/min — ABNORMAL LOW (ref >60)
GFR calc non Af Amer: 43 mL/min — ABNORMAL LOW (ref >60)
Glucose, Bld: 122 mg/dL — ABNORMAL HIGH (ref 70–99)
Potassium: 3.6 mmol/L (ref 3.5–5.1)
Sodium: 138 mmol/L (ref 135–145)

## 2020-05-02 LAB — RAPID URINE DRUG SCREEN, HOSP PERFORMED
Amphetamines: NOT DETECTED
Barbiturates: NOT DETECTED
Benzodiazepines: NOT DETECTED
Cocaine: NOT DETECTED
Opiates: NOT DETECTED
Tetrahydrocannabinol: NOT DETECTED

## 2020-05-02 LAB — ECHOCARDIOGRAM COMPLETE
AR max vel: 2.63 cm2
AV Area VTI: 2.55 cm2
AV Area mean vel: 2.51 cm2
AV Mean grad: 17.5 mmHg
AV Peak grad: 28.2 mmHg
Ao pk vel: 2.66 m/s
Area-P 1/2: 2.84 cm2
Height: 68 in
S' Lateral: 4.31 cm
Weight: 3648 oz

## 2020-05-02 LAB — CBG MONITORING, ED
Glucose-Capillary: 112 mg/dL — ABNORMAL HIGH (ref 70–99)
Glucose-Capillary: 114 mg/dL — ABNORMAL HIGH (ref 70–99)

## 2020-05-02 LAB — GLUCOSE, CAPILLARY
Glucose-Capillary: 137 mg/dL — ABNORMAL HIGH (ref 70–99)
Glucose-Capillary: 125 mg/dL — ABNORMAL HIGH (ref 70–99)

## 2020-05-02 LAB — HEMOGLOBIN A1C
Hgb A1c MFr Bld: 6.2 % — ABNORMAL HIGH (ref 4.8–5.6)
Mean Plasma Glucose: 131.24 mg/dL

## 2020-05-02 LAB — FERRITIN: Ferritin: 83 ng/mL (ref 24–336)

## 2020-05-02 NOTE — Progress Notes (Signed)
  Echocardiogram 2D Echocardiogram has been performed.  Connor Bryant 05/02/2020, 3:10 PM

## 2020-05-02 NOTE — ED Notes (Addendum)
Pt's son, Marjory Lies [phone], updated on pt status and POC

## 2020-05-02 NOTE — Progress Notes (Signed)
PROGRESS NOTE    Connor Bryant  KGM:[id number] DOB: 1945-12-08 DOA: 05/01/2020 PCP: Claretta Fraise, MD    Brief Narrative:  HPI: Connor Bryant is a 74 y.o. male with medical history significant for recent iliac stents, diabetes mellitus, hypertension. Patient presented to the ED today with complaints of losing vision in his left eye, started yesterday.  Patient woke up yesterday- Saturday morning with this, and this persisted today.  Reports it feels like a dark shadow on the peripheral side of his left eye. He also reports some tingling in his left arm-this has resolved.  No strokes.  He denies weakness of his extremities.  No change in speech, no facial asymmetry.  Patient took his aspirin and Plavix and statins today. Patient had bilateral iliac stents, 2 on the right, one on the left placed about 5 days ago at [facility].  The only cardiac history patient is aware of is a cardiac murmur.  He is not aware of history of irregular heart rhythm.  ED Course: Blood pressure systolic 1 1 5-1 2 7, heart rate 49-59, hemoglobin 8.6, recent baseline about 11.  Creatinine 1.7, about baseline.  Head CT shows acute right occipital infarct.  EKG showing atrial fibrillation.  Telemetry neurology consulted-patient outside TPA window, stroke work-up.  Dual antiplatelet for now.  Hospitalist to admit for acute CVA.   Assessment & Plan:   Principal Problem:   Acute CVA (cerebrovascular accident) ([place]) Active Problems:   Gout   Essential hypertension   S/P insertion of iliac artery stent   1. Acute CVA.  Patient presented with lateral field vision loss of the left eye.  CT head confirmed acute right occipital infarct.  He was outside the window of TPA.  MRI/MRA head ordered that showed widespread atherosclerotic disease.  Echocardiogram has been ordered with report pending.  PT OT eval's have also been ordered.  He passed a swallow evaluation.  LDL of 54, A1c of 6.2.  He is already on  statin.  He was taken dual antiplatelet therapy prior to admission.  Neurology consulted.  In light of new onset A. fib, he would need to be started on anticoagulation.  Case discussed with his vascular surgeon, Dr. Rosana Hoes at [facility].  Recommended that if patient is to be started on DOAC, he would recommend continuing Plavix and would be okay with discontinuing aspirin. 2. New onset atrial fibrillation.  CHADsVAsc score is 7.  Currently, heart rate is controlled.  Defer to neurology regarding timing of initiation of anticoagulation. 3. Recent bilateral iliac stents.  Patient had bilateral iliac stents placed 04/27/2020.  Continue on antiplatelet agents. 4. Chronic kidney disease stage IIIa.  Creatinine is near baseline.  Continue to monitor. 5. Diabetes mellitus, non-insulin-dependent, with nephropathy.  Chronically on Metformin, pioglitazone.  Holding oral agents.  Continue on sliding scale insulin. 6. Hypertension.  Chronically on hydralazine, hydrochlorothiazide, verapamil, and tandolapril.  These medications are on hold to allow for permissive hypertension.   DVT prophylaxis: heparin injection 5,000 Units Start: 05/01/20 2200  Code Status: Full code Family Communication: Discussed with patient Disposition Plan: Status is: Inpatient  Remains inpatient appropriate because:Ongoing diagnostic testing needed not appropriate for outpatient work up   Dispo: The patient is from: Home              Anticipated d/c is to: Home              Anticipated d/c date is: 1 day  Patient currently is not medically stable to d/c.    Consultants:   Neurology  Procedures:     Antimicrobials:       Subjective: Feels the left eye vision has been improving since arrival to the emergency room  Objective: Vitals:   05/02/20 0101 05/02/20 0159 05/02/20 0549 05/02/20 1410  BP: (!) 153/57 (!) 145/53 (!) 141/48 (!) 163/54  Pulse: 63 62 70 66  Resp: 17 17 16 16   Temp:    99.5 F (37.5  C)  TempSrc:    Oral  SpO2: 94% 95% 91% 97%  Weight:      Height:        Intake/Output Summary (Last 24 hours) at 05/02/2020 1610 Last data filed at 05/02/2020 1500 Gross per 24 hour  Intake 1156.32 ml  Output 350 ml  Net 806.32 ml   Filed Weights   05/01/20 1641  Weight: 103.4 kg    Examination:  General exam: Appears calm and comfortable  Respiratory system: Clear to auscultation. Respiratory effort normal. Cardiovascular system: S1 & S2 heard, RRR. No JVD, murmurs, rubs, gallops or clicks. No pedal edema. Gastrointestinal system: Abdomen is nondistended, soft and nontender. No organomegaly or masses felt. Normal bowel sounds heard. Central nervous system: Alert and oriented. No focal neurological deficits. Extremities: Symmetric 5 x 5 power. Skin: No rashes, lesions or ulcers Psychiatry: Judgement and insight appear normal. Mood & affect appropriate.     Data Reviewed: I have personally reviewed following labs and imaging studies  CBC: Recent Labs  Lab 05/01/20 1702 05/02/20 0400  WBC 7.8 5.2  NEUTROABS 5.9  --   HGB 8.6* 8.0*  HCT 26.5* 25.2*  MCV 96.4 97.7  PLT 211 510   Basic Metabolic Panel: Recent Labs  Lab 05/01/20 0502 05/01/20 1702 05/02/20 0400  NA  --  136 138  K  --  4.0 3.6  CL  --  104 106  CO2  --  18* 21*  GLUCOSE  --  174* 122*  BUN  --  40* 36*  CREATININE  --  1.79* 1.54*  CALCIUM  --  9.1 8.8*  MG 1.8  --   --    GFR: Estimated Creatinine Clearance: 48.3 mL/min (A) (by C-G formula based on SCr of 1.54 mg/dL (H)). Liver Function Tests: Recent Labs  Lab 05/01/20 1702  AST 23  ALT 20  ALKPHOS 60  BILITOT 1.0  PROT 7.2  ALBUMIN 3.7   No results for input(s): LIPASE, AMYLASE in the last 168 hours. No results for input(s): AMMONIA in the last 168 hours. Coagulation Profile: Recent Labs  Lab 05/01/20 1702  INR 1.1   Cardiac Enzymes: No results for input(s): CKTOTAL, CKMB, CKMBINDEX, TROPONINI in the last 168 hours. BNP  (last 3 results) No results for input(s): PROBNP in the last 8760 hours. HbA1C: Recent Labs    05/02/20 0400  HGBA1C 6.2*   CBG: Recent Labs  Lab 05/01/20 2136 05/02/20 0810 05/02/20 1155  GLUCAP 108* 112* 114*   Lipid Profile: Recent Labs    05/02/20 0400  CHOL 126  HDL 58  LDLCALC 54  TRIG 68  CHOLHDL 2.2   Thyroid Function Tests: Recent Labs    05/01/20 2021  TSH 5.072*   Anemia Panel: Recent Labs    05/02/20 0400  FERRITIN 83  TIBC 313  IRON 38*   Sepsis Labs: No results for input(s): PROCALCITON, LATICACIDVEN in the last 168 hours.  Recent Results (from the past 240 hour(s))  SARS Coronavirus 2 by RT PCR (hospital order, performed in [facility] hospital lab) Nasopharyngeal Nasopharyngeal Swab     Status: None   Collection Time: 05/01/20  7:43 PM   Specimen: Nasopharyngeal Swab  Result Value Ref Range Status   SARS Coronavirus 2 NEGATIVE NEGATIVE Final    Comment: (NOTE) SARS-CoV-2 target nucleic acids are NOT DETECTED.  The SARS-CoV-2 RNA is generally detectable in upper and lower respiratory specimens during the acute phase of infection. The lowest concentration of SARS-CoV-2 viral copies this assay can detect is 250 copies / mL. A negative result does not preclude SARS-CoV-2 infection and should not be used as the sole basis for treatment or other patient management decisions.  A negative result may occur with improper specimen collection / handling, submission of specimen other than nasopharyngeal swab, presence of viral mutation(s) within the areas targeted by this assay, and inadequate number of viral copies (<250 copies / mL). A negative result must be combined with clinical observations, patient history, and epidemiological information.  Fact Sheet for Patients:   StrictlyIdeas.no  Fact Sheet for Healthcare Providers: BankingDealers.co.za  This test is not yet approved or  cleared by the  [place] FDA and has been authorized for detection and/or diagnosis of SARS-CoV-2 by FDA under an Emergency Use Authorization (EUA).  This EUA will remain in effect (meaning this test can be used) for the duration of the COVID-19 declaration under Section 564(b)(1) of the Act, 21 U.S.C. section 360bbb-3(b)(1), unless the authorization is terminated or revoked sooner.  Performed at [facility], [address], [place], [place] [address]          Radiology Studies: CT Head Wo Contrast  Result Date: 05/01/2020 CLINICAL DATA:  Left side hemi anopsia, left arm paresthesias. EXAM: CT HEAD WITHOUT CONTRAST TECHNIQUE: Contiguous axial images were obtained from the base of the skull through the vertex without intravenous contrast. COMPARISON:  None. FINDINGS: Brain: Area of low-density noted in the right occipital lobe compatible with acute infarction. No other areas of infarct. Diffuse cerebral atrophy. No hydrocephalus. No hemorrhage. Vascular: No hyperdense vessel or unexpected calcification. Skull: No acute calvarial abnormality. Sinuses/Orbits: Visualized paranasal sinuses and mastoids clear. Orbital soft tissues unremarkable. Other: None IMPRESSION: Acute right occipital infarct. Electronically Signed   By: Rolm Baptise M.D.   On: 05/01/2020 17:24   MR ANGIO HEAD WO CONTRAST  Addendum Date: 05/02/2020   ADDENDUM REPORT: 05/02/2020 11:48 ADDENDUM: These results were called by telephone at the time of interpretation on 05/02/2020 at 11:47 am to provider Dr. Kathie Dike , who verbally acknowledged these results. Electronically Signed   By: Primitivo Gauze M.D.   On: 05/02/2020 11:48   Result Date: 05/02/2020 CLINICAL DATA:  Neuro deficit. EXAM: MR HEAD WITHOUT CONTRAST MR CIRCLE OF WILLIS WITHOUT CONTRAST MRA OF THE NECK WITHOUT CONTRAST TECHNIQUE: Multiplanar, multiecho pulse sequences of the brain, circle of willis and surrounding structures were obtained without intravenous contrast.  Angiographic images of the neck were obtained using MRA technique without intravenous contrast. COMPARISON:  05/01/2020 head CT. FINDINGS: MR HEAD FINDINGS Brain: Acute right occipital infarct. No intracranial hemorrhage. No midline shift, ventriculomegaly or extra-axial fluid collection. No mass lesion. Vascular: Please see MRA head. Skull and upper cervical spine: Normal marrow signal. Sinuses/Orbits: Normal orbits. Clear paranasal sinuses. Trace bilateral mastoid effusions. Other: None. MR CIRCLE OF WILLIS FINDINGS Image quality is degraded by motion artifact. Anterior circulation: Left A1 segment hypoplasia. Mild left A1 segment irregularity likely reflects atheromatous disease. Mild  right and moderate to severe narrowing of the distal left ICA cervical segments. Vessel wall irregularity involving the petrous and intracavernous ICAs likely reflects atheromatous disease versus motion artifact. Short segment moderate to severe narrowing involving the proximal right and distal bilateral intra cavernous ICAs (1022:9). The bilateral intracavernous ICAs demonstrates short segment, mild pre-stenotic dilatation (1022: 8). No vascular malformation. Posterior circulation: Dominant left vertebral artery. Diminutive appearance of the right V4 segment after PICA takeoff. Atheromatous disease. No high-grade narrowing, occlusion or aneurysm involving the bilateral PCAs. Small focal outpouching involving the right PCA reflects an infundibulum. No significant stenosis, aneurysm, or vascular malformation. Venous sinuses: No evidence of thrombosis. Anatomic variants: Bilateral PCOM hypoplasia. MRA NECK FINDINGS Image quality is degraded by motion artifact. Three-vessel aortic arch. There is no high-grade narrowing or focal aneurysm involving the bilateral common or right internal carotid artery. Approximately 40% luminal narrowing of the proximal left ICA secondary to atheromatous disease. Diminutive appearance of the right  vertebral artery. Proximal to the level of the glottis the right vertebral artery is not visualized, concerning for proximal occlusion. The left vertebral artery is patent and demonstrates antegrade flow. No evidence of high-grade narrowing or focal aneurysm involving left vertebral artery. Dominant left vertebral artery. IMPRESSION: 1. Acute right occipital infarct. 2. Nonvisualization of the proximal right vertebral artery inferior to the level of the glottis, concerning for occlusion. 3. Atheromatous disease with multifocal intracranial stenoses most notably is follows. 4. Short-segment moderate to severe proximal right and distal bilateral intracavernous ICA narrowing. Both of the intracavernous ICAs demonstrate mild short-segment pre stenotic dilatation. 5. Moderate to severe short segment narrowing of the distal left ICA cervical segment. 6. Approximately 40% luminal narrowing of the proximal left ICA, distal to the bifurcation. 7. Please note that motion artifact on MRA head and neck imaging limits evaluation. Electronically Signed: By: Primitivo Gauze M.D. On: 05/02/2020 11:41   MR ANGIO NECK WO CONTRAST  Addendum Date: 05/02/2020   ADDENDUM REPORT: 05/02/2020 11:48 ADDENDUM: These results were called by telephone at the time of interpretation on 05/02/2020 at 11:47 am to provider Dr. Kathie Dike , who verbally acknowledged these results. Electronically Signed   By: Primitivo Gauze M.D.   On: 05/02/2020 11:48   Result Date: 05/02/2020 CLINICAL DATA:  Neuro deficit. EXAM: MR HEAD WITHOUT CONTRAST MR CIRCLE OF WILLIS WITHOUT CONTRAST MRA OF THE NECK WITHOUT CONTRAST TECHNIQUE: Multiplanar, multiecho pulse sequences of the brain, circle of willis and surrounding structures were obtained without intravenous contrast. Angiographic images of the neck were obtained using MRA technique without intravenous contrast. COMPARISON:  05/01/2020 head CT. FINDINGS: MR HEAD FINDINGS Brain: Acute right occipital  infarct. No intracranial hemorrhage. No midline shift, ventriculomegaly or extra-axial fluid collection. No mass lesion. Vascular: Please see MRA head. Skull and upper cervical spine: Normal marrow signal. Sinuses/Orbits: Normal orbits. Clear paranasal sinuses. Trace bilateral mastoid effusions. Other: None. MR CIRCLE OF WILLIS FINDINGS Image quality is degraded by motion artifact. Anterior circulation: Left A1 segment hypoplasia. Mild left A1 segment irregularity likely reflects atheromatous disease. Mild right and moderate to severe narrowing of the distal left ICA cervical segments. Vessel wall irregularity involving the petrous and intracavernous ICAs likely reflects atheromatous disease versus motion artifact. Short segment moderate to severe narrowing involving the proximal right and distal bilateral intra cavernous ICAs (1022:9). The bilateral intracavernous ICAs demonstrates short segment, mild pre-stenotic dilatation (1022: 8). No vascular malformation. Posterior circulation: Dominant left vertebral artery. Diminutive appearance of the right V4 segment after PICA takeoff.  Atheromatous disease. No high-grade narrowing, occlusion or aneurysm involving the bilateral PCAs. Small focal outpouching involving the right PCA reflects an infundibulum. No significant stenosis, aneurysm, or vascular malformation. Venous sinuses: No evidence of thrombosis. Anatomic variants: Bilateral PCOM hypoplasia. MRA NECK FINDINGS Image quality is degraded by motion artifact. Three-vessel aortic arch. There is no high-grade narrowing or focal aneurysm involving the bilateral common or right internal carotid artery. Approximately 40% luminal narrowing of the proximal left ICA secondary to atheromatous disease. Diminutive appearance of the right vertebral artery. Proximal to the level of the glottis the right vertebral artery is not visualized, concerning for proximal occlusion. The left vertebral artery is patent and demonstrates  antegrade flow. No evidence of high-grade narrowing or focal aneurysm involving left vertebral artery. Dominant left vertebral artery. IMPRESSION: 1. Acute right occipital infarct. 2. Nonvisualization of the proximal right vertebral artery inferior to the level of the glottis, concerning for occlusion. 3. Atheromatous disease with multifocal intracranial stenoses most notably is follows. 4. Short-segment moderate to severe proximal right and distal bilateral intracavernous ICA narrowing. Both of the intracavernous ICAs demonstrate mild short-segment pre stenotic dilatation. 5. Moderate to severe short segment narrowing of the distal left ICA cervical segment. 6. Approximately 40% luminal narrowing of the proximal left ICA, distal to the bifurcation. 7. Please note that motion artifact on MRA head and neck imaging limits evaluation. Electronically Signed: By: Primitivo Gauze M.D. On: 05/02/2020 11:41   MR BRAIN WO CONTRAST  Addendum Date: 05/02/2020   ADDENDUM REPORT: 05/02/2020 11:48 ADDENDUM: These results were called by telephone at the time of interpretation on 05/02/2020 at 11:47 am to provider Dr. Kathie Dike , who verbally acknowledged these results. Electronically Signed   By: Primitivo Gauze M.D.   On: 05/02/2020 11:48   Result Date: 05/02/2020 CLINICAL DATA:  Neuro deficit. EXAM: MR HEAD WITHOUT CONTRAST MR CIRCLE OF WILLIS WITHOUT CONTRAST MRA OF THE NECK WITHOUT CONTRAST TECHNIQUE: Multiplanar, multiecho pulse sequences of the brain, circle of willis and surrounding structures were obtained without intravenous contrast. Angiographic images of the neck were obtained using MRA technique without intravenous contrast. COMPARISON:  05/01/2020 head CT. FINDINGS: MR HEAD FINDINGS Brain: Acute right occipital infarct. No intracranial hemorrhage. No midline shift, ventriculomegaly or extra-axial fluid collection. No mass lesion. Vascular: Please see MRA head. Skull and upper cervical spine: Normal  marrow signal. Sinuses/Orbits: Normal orbits. Clear paranasal sinuses. Trace bilateral mastoid effusions. Other: None. MR CIRCLE OF WILLIS FINDINGS Image quality is degraded by motion artifact. Anterior circulation: Left A1 segment hypoplasia. Mild left A1 segment irregularity likely reflects atheromatous disease. Mild right and moderate to severe narrowing of the distal left ICA cervical segments. Vessel wall irregularity involving the petrous and intracavernous ICAs likely reflects atheromatous disease versus motion artifact. Short segment moderate to severe narrowing involving the proximal right and distal bilateral intra cavernous ICAs (1022:9). The bilateral intracavernous ICAs demonstrates short segment, mild pre-stenotic dilatation (1022: 8). No vascular malformation. Posterior circulation: Dominant left vertebral artery. Diminutive appearance of the right V4 segment after PICA takeoff. Atheromatous disease. No high-grade narrowing, occlusion or aneurysm involving the bilateral PCAs. Small focal outpouching involving the right PCA reflects an infundibulum. No significant stenosis, aneurysm, or vascular malformation. Venous sinuses: No evidence of thrombosis. Anatomic variants: Bilateral PCOM hypoplasia. MRA NECK FINDINGS Image quality is degraded by motion artifact. Three-vessel aortic arch. There is no high-grade narrowing or focal aneurysm involving the bilateral common or right internal carotid artery. Approximately 40% luminal narrowing of the proximal left  ICA secondary to atheromatous disease. Diminutive appearance of the right vertebral artery. Proximal to the level of the glottis the right vertebral artery is not visualized, concerning for proximal occlusion. The left vertebral artery is patent and demonstrates antegrade flow. No evidence of high-grade narrowing or focal aneurysm involving left vertebral artery. Dominant left vertebral artery. IMPRESSION: 1. Acute right occipital infarct. 2.  Nonvisualization of the proximal right vertebral artery inferior to the level of the glottis, concerning for occlusion. 3. Atheromatous disease with multifocal intracranial stenoses most notably is follows. 4. Short-segment moderate to severe proximal right and distal bilateral intracavernous ICA narrowing. Both of the intracavernous ICAs demonstrate mild short-segment pre stenotic dilatation. 5. Moderate to severe short segment narrowing of the distal left ICA cervical segment. 6. Approximately 40% luminal narrowing of the proximal left ICA, distal to the bifurcation. 7. Please note that motion artifact on MRA head and neck imaging limits evaluation. Electronically Signed: By: Primitivo Gauze M.D. On: 05/02/2020 11:41        Scheduled Meds: . allopurinol  100 mg Oral BID  . aspirin  81 mg Oral Daily  . atorvastatin  20 mg Oral Daily  . clopidogrel  75 mg Oral Daily  . heparin  5,000 Units Subcutaneous Q8H  . insulin aspart  0-5 Units Subcutaneous QHS  . insulin aspart  0-9 Units Subcutaneous TID WC  . polyethylene glycol  8.5 g Oral Daily   Continuous Infusions: . sodium chloride 75 mL/hr at 05/02/20 0635     LOS: 1 day    Time spent: 77mins    Kathie Dike, MD [facility]   If 7PM-7AM, please contact night-coverage [email or web address]  05/02/2020, 4:10 PM

## 2020-05-02 NOTE — ED Notes (Signed)
Report to Lattie Haw, RN [id number]

## 2020-05-02 NOTE — ED Notes (Signed)
ED TO INPATIENT HANDOFF REPORT  ED Nurse Name and Phone #:  [phone]  S Name/Age/Gender Connor Bryant 74 y.o. male Room/Bed: APA14/APA14  Code Status   Code Status: Full Code  Home/SNF/Other Home Patient oriented to: self, place, time and situation Is this baseline? Yes   Triage Complete: Triage complete  Chief Complaint Acute CVA (cerebrovascular accident) [facility]) [I63.9]  Triage Note Pt to er, pt states that his last know well was Friday night, states that he has lost vision in his L eye, states that it is a dark cloud on his L side.  MD evaluating pt.  Pt states that he woke up Saturday am with the vision loss, pt reports that he had some tingling in his L arm today.      Allergies No Known Allergies  Level of Care/Admitting Diagnosis ED Disposition    ED Disposition Condition Comment   Admit  Hospital Area: [facility] [[id number]]  Level of Care: Telemetry [5]  Covid Evaluation: Asymptomatic Screening Protocol (No Symptoms)  Diagnosis: Acute CVA (cerebrovascular accident) [facility]) [[id number]]  Admitting Physician: Bethena Roys [phone]  Attending Physician: Bethena Roys [phone]  Estimated length of stay: past midnight tomorrow  Certification:: I certify this patient will need inpatient services for at least 2 midnights       B Medical/Surgery History Past Medical History:  Diagnosis Date   Arthritis    DM type 2 (diabetes mellitus, type 2) ([place])    no meds   Gallstone    Heart murmur    was told one time he had a murmur, not since 30 years ago   HTN (hypertension)    Hyperlipidemia    Personal history of colonic polyp-adenoma 11/24/2013   Renal insufficiency    mild   Thoracic spine fracture [facility])    Past Surgical History:  Procedure Laterality Date   ABDOMINAL AORTAGRAM N/A 02/23/2014   Procedure: ABDOMINAL [name];  Surgeon: Conrad [place], MD;  Location: [facility] CATH LAB;  Service: Cardiovascular;  Laterality: N/A;   COLONOSCOPY      DEBRIDEMENT TENNIS ELBOW Right    ESOPHAGOGASTRODUODENOSCOPY     MESENTERIC ARTERY BYPASS N/A 03/10/2014   Procedure: AORTO TO SUPERIOR MESENTERIC ARTERY BYPASS;  Surgeon: [name], MD;  Location: [place];  Service: Vascular;  Laterality: N/A;   TONSILLECTOMY AND ADENOIDECTOMY     vascualr surgery     illiac stents     A IV Location/Drains/Wounds Patient Lines/Drains/Airways Status    Active Line/Drains/Airways    Name Placement date Placement time Site Days   Peripheral IV 05/01/20 Right Hand 05/01/20  1703  Hand  1          Intake/Output Last 24 hours  Intake/Output Summary (Last 24 hours) at 05/02/2020 1153 Last data filed at 05/02/2020 9702 Gross per 24 hour  Intake 647.15 ml  Output 350 ml  Net 297.15 ml    Labs/Imaging Results for orders placed or performed during the hospital encounter of 05/01/20 (from the past 48 hour(s))  Magnesium     Status: None   Collection Time: 05/01/20  5:02 AM  Result Value Ref Range   Magnesium 1.8 1.7 - 2.4 mg/dL    Comment: Performed at [facility], [address] [place], [place] [address]  Ethanol     Status: None   Collection Time: 05/01/20  5:02 PM  Result Value Ref Range   Alcohol, Ethyl (B) <10 <10 mg/dL    Comment: (NOTE) Lowest detectable  limit for serum alcohol is 10 mg/dL.  For medical purposes only. Performed at [facility], [address] [place], [place] [address]   Protime-INR     Status: None   Collection Time: 05/01/20  5:02 PM  Result Value Ref Range   Prothrombin Time 13.7 11.4 - 15.2 seconds   INR 1.1 0.8 - 1.2    Comment: (NOTE) INR goal varies based on device and disease states. Performed at [facility], [address], [place], [place] [address]   APTT     Status: None   Collection Time: 05/01/20  5:02 PM  Result Value Ref Range   aPTT 29 24 - 36 seconds    Comment: Performed at [facility], [address] [place], [place] [address]  CBC     Status: Abnormal   Collection Time:  05/01/20  5:02 PM  Result Value Ref Range   WBC 7.8 4.0 - 10.5 K/uL   RBC 2.75 (L) 4.22 - 5.81 MIL/uL   Hemoglobin 8.6 (L) 13.0 - 17.0 g/dL   HCT 26.5 (L) 39 - 52 %   MCV 96.4 80.0 - 100.0 fL   MCH 31.3 26.0 - 34.0 pg   MCHC 32.5 30.0 - 36.0 g/dL   RDW 15.7 (H) 11.5 - 15.5 %   Platelets 211 150 - 400 K/uL   nRBC 0.0 0.0 - 0.2 %    Comment: Performed at [facility], [address], [place], [place] [address]  Differential     Status: None   Collection Time: 05/01/20  5:02 PM  Result Value Ref Range   Neutrophils Relative % 75 %   Neutro Abs 5.9 1.7 - 7.7 K/uL   Lymphocytes Relative 13 %   Lymphs Abs 1.0 0.7 - 4.0 K/uL   Monocytes Relative 9 %   Monocytes Absolute 0.7 0 - 1 K/uL   Eosinophils Relative 2 %   Eosinophils Absolute 0.1 0 - 0 K/uL   Basophils Relative 0 %   Basophils Absolute 0.0 0 - 0 K/uL   Immature Granulocytes 1 %   Abs Immature Granulocytes 0.04 0.00 - 0.07 K/uL    Comment: Performed at [facility], [address], [place], [place] [address]  Comprehensive metabolic panel     Status: Abnormal   Collection Time: 05/01/20  5:02 PM  Result Value Ref Range   Sodium 136 135 - 145 mmol/L   Potassium 4.0 3.5 - 5.1 mmol/L   Chloride 104 98 - 111 mmol/L   CO2 18 (L) 22 - 32 mmol/L   Glucose, Bld 174 (H) 70 - 99 mg/dL    Comment: Glucose reference range applies only to samples taken after fasting for at least 8 hours.   BUN 40 (H) 8 - 23 mg/dL   Creatinine, Ser 1.79 (H) 0.61 - 1.24 mg/dL   Calcium 9.1 8.9 - 10.3 mg/dL   Total Protein 7.2 6.5 - 8.1 g/dL   Albumin 3.7 3.5 - 5.0 g/dL   AST 23 15 - 41 U/L   ALT 20 0 - 44 U/L   Alkaline Phosphatase 60 38 - 126 U/L   Total Bilirubin 1.0 0.3 - 1.2 mg/dL   GFR calc non Af Amer 36 (L) >60 mL/min   GFR calc Af Amer 42 (L) >60 mL/min   Anion gap 14 5 - 15    Comment: Performed at [facility], [address] [place], [place] [address]  SARS Coronavirus 2 by RT PCR (hospital order, performed in [place]  hospital lab)  Nasopharyngeal Nasopharyngeal Swab     Status: None   Collection Time: 05/01/20  7:43 PM   Specimen: Nasopharyngeal Swab  Result Value Ref Range   SARS Coronavirus 2 NEGATIVE NEGATIVE    Comment: (NOTE) SARS-CoV-2 target nucleic acids are NOT DETECTED.  The SARS-CoV-2 RNA is generally detectable in upper and lower respiratory specimens during the acute phase of infection. The lowest concentration of SARS-CoV-2 viral copies this assay can detect is 250 copies / mL. A negative result does not preclude SARS-CoV-2 infection and should not be used as the sole basis for treatment or other patient management decisions.  A negative result may occur with improper specimen collection / handling, submission of specimen other than nasopharyngeal swab, presence of viral mutation(s) within the areas targeted by this assay, and inadequate number of viral copies (<250 copies / mL). A negative result must be combined with clinical observations, patient history, and epidemiological information.  Fact Sheet for Patients:   StrictlyIdeas.no  Fact Sheet for Healthcare Providers: BankingDealers.co.za  This test is not yet approved or  cleared by the [place] FDA and has been authorized for detection and/or diagnosis of SARS-CoV-2 by FDA under an Emergency Use Authorization (EUA).  This EUA will remain in effect (meaning this test can be used) for the duration of the COVID-19 declaration under Section 564(b)(1) of the Act, 21 U.S.C. section 360bbb-3(b)(1), unless the authorization is terminated or revoked sooner.  Performed at [facility], [address] [place], [place] [address]   TSH     Status: Abnormal   Collection Time: 05/01/20  8:21 PM  Result Value Ref Range   TSH 5.072 (H) 0.350 - 4.500 uIU/mL    Comment: Performed by a 3rd Generation assay with a functional sensitivity of <=0.01 uIU/mL. Performed at [facility], [address]  [place], [place] [address]   CBG monitoring, ED     Status: Abnormal   Collection Time: 05/01/20  9:36 PM  Result Value Ref Range   Glucose-Capillary 108 (H) 70 - 99 mg/dL    Comment: Glucose reference range applies only to samples taken after fasting for at least 8 hours.  Urine rapid drug screen (hosp performed)     Status: None   Collection Time: 05/02/20  3:10 AM  Result Value Ref Range   Opiates NONE DETECTED NONE DETECTED   Cocaine NONE DETECTED NONE DETECTED   Benzodiazepines NONE DETECTED NONE DETECTED   Amphetamines NONE DETECTED NONE DETECTED   Tetrahydrocannabinol NONE DETECTED NONE DETECTED   Barbiturates NONE DETECTED NONE DETECTED    Comment: (NOTE) DRUG SCREEN FOR MEDICAL PURPOSES ONLY.  IF CONFIRMATION IS NEEDED FOR ANY PURPOSE, NOTIFY LAB WITHIN 5 DAYS.  LOWEST DETECTABLE LIMITS FOR URINE DRUG SCREEN Drug Class                     Cutoff (ng/mL) Amphetamine and metabolites    1000 Barbiturate and metabolites    200 Benzodiazepine                 287 Tricyclics and metabolites     300 Opiates and metabolites        300 Cocaine and metabolites        300 THC                            50 Performed at [facility], [address], [place], [place] [address]  Basic metabolic panel     Status: Abnormal   Collection Time: 05/02/20  4:00 AM  Result Value Ref Range   Sodium 138 135 - 145 mmol/L   Potassium 3.6 3.5 - 5.1 mmol/L   Chloride 106 98 - 111 mmol/L   CO2 21 (L) 22 - 32 mmol/L   Glucose, Bld 122 (H) 70 - 99 mg/dL    Comment: Glucose reference range applies only to samples taken after fasting for at least 8 hours.   BUN 36 (H) 8 - 23 mg/dL   Creatinine, Ser 1.54 (H) 0.61 - 1.24 mg/dL   Calcium 8.8 (L) 8.9 - 10.3 mg/dL   GFR calc non Af Amer 43 (L) >60 mL/min   GFR calc Af Amer 50 (L) >60 mL/min   Anion gap 11 5 - 15    Comment: Performed at [facility], [address], [place], [place] [address]  CBC     Status: Abnormal   Collection Time: 05/02/20  4:00  AM  Result Value Ref Range   WBC 5.2 4.0 - 10.5 K/uL   RBC 2.58 (L) 4.22 - 5.81 MIL/uL   Hemoglobin 8.0 (L) 13.0 - 17.0 g/dL   HCT 25.2 (L) 39 - 52 %   MCV 97.7 80.0 - 100.0 fL   MCH 31.0 26.0 - 34.0 pg   MCHC 31.7 30.0 - 36.0 g/dL   RDW 15.8 (H) 11.5 - 15.5 %   Platelets 176 150 - 400 K/uL   nRBC 0.0 0.0 - 0.2 %    Comment: Performed at [facility], [address], [place], [place] [address]  Lipid panel     Status: None   Collection Time: 05/02/20  4:00 AM  Result Value Ref Range   Cholesterol 126 0 - 200 mg/dL   Triglycerides 68 <150 mg/dL   HDL 58 >40 mg/dL   Total CHOL/HDL Ratio 2.2 RATIO   VLDL 14 0 - 40 mg/dL   LDL Cholesterol 54 0 - 99 mg/dL    Comment:        Total Cholesterol/HDL:CHD Risk Coronary Heart Disease Risk Table                     Men   Women  1/2 Average Risk   3.4   3.3  Average Risk       5.0   4.4  2 X Average Risk   9.6   7.1  3 X Average Risk  23.4   11.0        Use the calculated Patient Ratio above and the CHD Risk Table to determine the patient's CHD Risk.        ATP III CLASSIFICATION (LDL):  <100     mg/dL   Optimal  100-129  mg/dL   Near or Above                    Optimal  130-159  mg/dL   Borderline  160-189  mg/dL   High  >190     mg/dL   Very High Performed at [facility], [address], [place], [place] [address]   Ferritin     Status: None   Collection Time: 05/02/20  4:00 AM  Result Value Ref Range   Ferritin 83 24 - 336 ng/mL    Comment: Performed at [facility], [address], [place], [place] [address]  Iron and TIBC     Status: Abnormal   Collection Time: 05/02/20  4:00 AM  Result Value Ref Range   Iron 38 (L) 45 - 182 ug/dL   TIBC 313 250 - 450 ug/dL   Saturation Ratios 12 (L) 17.9 - 39.5 %   UIBC 275 ug/dL    Comment: Performed at [facility], [address] [place], [place] [address]  CBG monitoring, ED     Status: Abnormal   Collection Time: 05/02/20  8:10 AM  Result Value Ref Range   Glucose-Capillary 112  (H) 70 - 99 mg/dL    Comment: Glucose reference range applies only to samples taken after fasting for at least 8 hours.   CT Head Wo Contrast  Result Date: 05/01/2020 CLINICAL DATA:  Left side hemi anopsia, left arm paresthesias. EXAM: CT HEAD WITHOUT CONTRAST TECHNIQUE: Contiguous axial images were obtained from the base of the skull through the vertex without intravenous contrast. COMPARISON:  None. FINDINGS: Brain: Area of low-density noted in the right occipital lobe compatible with acute infarction. No other areas of infarct. Diffuse cerebral atrophy. No hydrocephalus. No hemorrhage. Vascular: No hyperdense vessel or unexpected calcification. Skull: No acute calvarial abnormality. Sinuses/Orbits: Visualized paranasal sinuses and mastoids clear. Orbital soft tissues unremarkable. Other: None IMPRESSION: Acute right occipital infarct. Electronically Signed   By: Rolm Baptise M.D.   On: 05/01/2020 17:24   MR ANGIO HEAD WO CONTRAST  Addendum Date: 05/02/2020   ADDENDUM REPORT: 05/02/2020 11:48 ADDENDUM: These results were called by telephone at the time of interpretation on 05/02/2020 at 11:47 am to provider Dr. Kathie Dike , who verbally acknowledged these results. Electronically Signed   By: Primitivo Gauze M.D.   On: 05/02/2020 11:48   Result Date: 05/02/2020 CLINICAL DATA:  Neuro deficit. EXAM: MR HEAD WITHOUT CONTRAST MR CIRCLE OF WILLIS WITHOUT CONTRAST MRA OF THE NECK WITHOUT CONTRAST TECHNIQUE: Multiplanar, multiecho pulse sequences of the brain, circle of willis and surrounding structures were obtained without intravenous contrast. Angiographic images of the neck were obtained using MRA technique without intravenous contrast. COMPARISON:  05/01/2020 head CT. FINDINGS: MR HEAD FINDINGS Brain: Acute right occipital infarct. No intracranial hemorrhage. No midline shift, ventriculomegaly or extra-axial fluid collection. No mass lesion. Vascular: Please see MRA head. Skull and upper cervical spine:  Normal marrow signal. Sinuses/Orbits: Normal orbits. Clear paranasal sinuses. Trace bilateral mastoid effusions. Other: None. MR CIRCLE OF WILLIS FINDINGS Image quality is degraded by motion artifact. Anterior circulation: Left A1 segment hypoplasia. Mild left A1 segment irregularity likely reflects atheromatous disease. Mild right and moderate to severe narrowing of the distal left ICA cervical segments. Vessel wall irregularity involving the petrous and intracavernous ICAs likely reflects atheromatous disease versus motion artifact. Short segment moderate to severe narrowing involving the proximal right and distal bilateral intra cavernous ICAs (1022:9). The bilateral intracavernous ICAs demonstrates short segment, mild pre-stenotic dilatation (1022: 8). No vascular malformation. Posterior circulation: Dominant left vertebral artery. Diminutive appearance of the right V4 segment after PICA takeoff. Atheromatous disease. No high-grade narrowing, occlusion or aneurysm involving the bilateral PCAs. Small focal outpouching involving the right PCA reflects an infundibulum. No significant stenosis, aneurysm, or vascular malformation. Venous sinuses: No evidence of thrombosis. Anatomic variants: Bilateral PCOM hypoplasia. MRA NECK FINDINGS Image quality is degraded by motion artifact. Three-vessel aortic arch. There is no high-grade narrowing or focal aneurysm involving the bilateral common or right internal carotid artery. Approximately 40% luminal narrowing of the proximal left ICA secondary to atheromatous disease. Diminutive appearance of the right vertebral artery. Proximal to the level of the glottis the right vertebral  artery is not visualized, concerning for proximal occlusion. The left vertebral artery is patent and demonstrates antegrade flow. No evidence of high-grade narrowing or focal aneurysm involving left vertebral artery. Dominant left vertebral artery. IMPRESSION: 1. Acute right occipital infarct. 2.  Nonvisualization of the proximal right vertebral artery inferior to the level of the glottis, concerning for occlusion. 3. Atheromatous disease with multifocal intracranial stenoses most notably is follows. 4. Short-segment moderate to severe proximal right and distal bilateral intracavernous ICA narrowing. Both of the intracavernous ICAs demonstrate mild short-segment pre stenotic dilatation. 5. Moderate to severe short segment narrowing of the distal left ICA cervical segment. 6. Approximately 40% luminal narrowing of the proximal left ICA, distal to the bifurcation. 7. Please note that motion artifact on MRA head and neck imaging limits evaluation. Electronically Signed: By: Primitivo Gauze M.D. On: 05/02/2020 11:41   MR ANGIO NECK WO CONTRAST  Addendum Date: 05/02/2020   ADDENDUM REPORT: 05/02/2020 11:48 ADDENDUM: These results were called by telephone at the time of interpretation on 05/02/2020 at 11:47 am to provider Dr. Kathie Dike , who verbally acknowledged these results. Electronically Signed   By: Primitivo Gauze M.D.   On: 05/02/2020 11:48   Result Date: 05/02/2020 CLINICAL DATA:  Neuro deficit. EXAM: MR HEAD WITHOUT CONTRAST MR CIRCLE OF WILLIS WITHOUT CONTRAST MRA OF THE NECK WITHOUT CONTRAST TECHNIQUE: Multiplanar, multiecho pulse sequences of the brain, circle of willis and surrounding structures were obtained without intravenous contrast. Angiographic images of the neck were obtained using MRA technique without intravenous contrast. COMPARISON:  05/01/2020 head CT. FINDINGS: MR HEAD FINDINGS Brain: Acute right occipital infarct. No intracranial hemorrhage. No midline shift, ventriculomegaly or extra-axial fluid collection. No mass lesion. Vascular: Please see MRA head. Skull and upper cervical spine: Normal marrow signal. Sinuses/Orbits: Normal orbits. Clear paranasal sinuses. Trace bilateral mastoid effusions. Other: None. MR CIRCLE OF WILLIS FINDINGS Image quality is degraded by  motion artifact. Anterior circulation: Left A1 segment hypoplasia. Mild left A1 segment irregularity likely reflects atheromatous disease. Mild right and moderate to severe narrowing of the distal left ICA cervical segments. Vessel wall irregularity involving the petrous and intracavernous ICAs likely reflects atheromatous disease versus motion artifact. Short segment moderate to severe narrowing involving the proximal right and distal bilateral intra cavernous ICAs (1022:9). The bilateral intracavernous ICAs demonstrates short segment, mild pre-stenotic dilatation (1022: 8). No vascular malformation. Posterior circulation: Dominant left vertebral artery. Diminutive appearance of the right V4 segment after PICA takeoff. Atheromatous disease. No high-grade narrowing, occlusion or aneurysm involving the bilateral PCAs. Small focal outpouching involving the right PCA reflects an infundibulum. No significant stenosis, aneurysm, or vascular malformation. Venous sinuses: No evidence of thrombosis. Anatomic variants: Bilateral PCOM hypoplasia. MRA NECK FINDINGS Image quality is degraded by motion artifact. Three-vessel aortic arch. There is no high-grade narrowing or focal aneurysm involving the bilateral common or right internal carotid artery. Approximately 40% luminal narrowing of the proximal left ICA secondary to atheromatous disease. Diminutive appearance of the right vertebral artery. Proximal to the level of the glottis the right vertebral artery is not visualized, concerning for proximal occlusion. The left vertebral artery is patent and demonstrates antegrade flow. No evidence of high-grade narrowing or focal aneurysm involving left vertebral artery. Dominant left vertebral artery. IMPRESSION: 1. Acute right occipital infarct. 2. Nonvisualization of the proximal right vertebral artery inferior to the level of the glottis, concerning for occlusion. 3. Atheromatous disease with multifocal intracranial stenoses most  notably is follows. 4. Short-segment moderate to severe proximal right  and distal bilateral intracavernous ICA narrowing. Both of the intracavernous ICAs demonstrate mild short-segment pre stenotic dilatation. 5. Moderate to severe short segment narrowing of the distal left ICA cervical segment. 6. Approximately 40% luminal narrowing of the proximal left ICA, distal to the bifurcation. 7. Please note that motion artifact on MRA head and neck imaging limits evaluation. Electronically Signed: By: Primitivo Gauze M.D. On: 05/02/2020 11:41   MR BRAIN WO CONTRAST  Addendum Date: 05/02/2020   ADDENDUM REPORT: 05/02/2020 11:48 ADDENDUM: These results were called by telephone at the time of interpretation on 05/02/2020 at 11:47 am to provider Dr. Kathie Dike , who verbally acknowledged these results. Electronically Signed   By: Primitivo Gauze M.D.   On: 05/02/2020 11:48   Result Date: 05/02/2020 CLINICAL DATA:  Neuro deficit. EXAM: MR HEAD WITHOUT CONTRAST MR CIRCLE OF WILLIS WITHOUT CONTRAST MRA OF THE NECK WITHOUT CONTRAST TECHNIQUE: Multiplanar, multiecho pulse sequences of the brain, circle of willis and surrounding structures were obtained without intravenous contrast. Angiographic images of the neck were obtained using MRA technique without intravenous contrast. COMPARISON:  05/01/2020 head CT. FINDINGS: MR HEAD FINDINGS Brain: Acute right occipital infarct. No intracranial hemorrhage. No midline shift, ventriculomegaly or extra-axial fluid collection. No mass lesion. Vascular: Please see MRA head. Skull and upper cervical spine: Normal marrow signal. Sinuses/Orbits: Normal orbits. Clear paranasal sinuses. Trace bilateral mastoid effusions. Other: None. MR CIRCLE OF WILLIS FINDINGS Image quality is degraded by motion artifact. Anterior circulation: Left A1 segment hypoplasia. Mild left A1 segment irregularity likely reflects atheromatous disease. Mild right and moderate to severe narrowing of the distal  left ICA cervical segments. Vessel wall irregularity involving the petrous and intracavernous ICAs likely reflects atheromatous disease versus motion artifact. Short segment moderate to severe narrowing involving the proximal right and distal bilateral intra cavernous ICAs (1022:9). The bilateral intracavernous ICAs demonstrates short segment, mild pre-stenotic dilatation (1022: 8). No vascular malformation. Posterior circulation: Dominant left vertebral artery. Diminutive appearance of the right V4 segment after PICA takeoff. Atheromatous disease. No high-grade narrowing, occlusion or aneurysm involving the bilateral PCAs. Small focal outpouching involving the right PCA reflects an infundibulum. No significant stenosis, aneurysm, or vascular malformation. Venous sinuses: No evidence of thrombosis. Anatomic variants: Bilateral PCOM hypoplasia. MRA NECK FINDINGS Image quality is degraded by motion artifact. Three-vessel aortic arch. There is no high-grade narrowing or focal aneurysm involving the bilateral common or right internal carotid artery. Approximately 40% luminal narrowing of the proximal left ICA secondary to atheromatous disease. Diminutive appearance of the right vertebral artery. Proximal to the level of the glottis the right vertebral artery is not visualized, concerning for proximal occlusion. The left vertebral artery is patent and demonstrates antegrade flow. No evidence of high-grade narrowing or focal aneurysm involving left vertebral artery. Dominant left vertebral artery. IMPRESSION: 1. Acute right occipital infarct. 2. Nonvisualization of the proximal right vertebral artery inferior to the level of the glottis, concerning for occlusion. 3. Atheromatous disease with multifocal intracranial stenoses most notably is follows. 4. Short-segment moderate to severe proximal right and distal bilateral intracavernous ICA narrowing. Both of the intracavernous ICAs demonstrate mild short-segment pre  stenotic dilatation. 5. Moderate to severe short segment narrowing of the distal left ICA cervical segment. 6. Approximately 40% luminal narrowing of the proximal left ICA, distal to the bifurcation. 7. Please note that motion artifact on MRA head and neck imaging limits evaluation. Electronically Signed: By: Primitivo Gauze M.D. On: 05/02/2020 11:41    Pending Labs Unresulted Labs (From admission,  onward) Comment          Start     Ordered   05/02/20 1025  Hemoglobin A1c  Add-on,   AD        05/02/20 1024          Vitals/Pain Today's Vitals   05/01/20 2326 05/02/20 0101 05/02/20 0159 05/02/20 0549  BP:  (!) 153/57 (!) 145/53 (!) 141/48  Pulse:  63 62 70  Resp:  17 17 16   Temp:      TempSrc:      SpO2:  94% 95% 91%  Weight:      Height:      PainSc: 0-No pain       Isolation Precautions No active isolations  Medications Medications  heparin injection 5,000 Units (5,000 Units Subcutaneous Given 05/02/20 0634)  acetaminophen (TYLENOL) tablet 650 mg (has no administration in time range)    Or  acetaminophen (TYLENOL) suppository 650 mg (has no administration in time range)  insulin aspart (novoLOG) injection 0-9 Units (0 Units Subcutaneous Not Given 05/02/20 0828)  insulin aspart (novoLOG) injection 0-5 Units (0 Units Subcutaneous Not Given 05/01/20 2153)  0.9 %  sodium chloride infusion ( Intravenous Rate/Dose Verify 05/02/20 0635)  hydrALAZINE (APRESOLINE) injection 10 mg (has no administration in time range)  allopurinol (ZYLOPRIM) tablet 100 mg (100 mg Oral Given 05/02/20 1051)  aspirin chewable tablet 81 mg (81 mg Oral Given 05/02/20 1051)  atorvastatin (LIPITOR) tablet 20 mg (20 mg Oral Given 05/02/20 1050)  clopidogrel (PLAVIX) tablet 75 mg (75 mg Oral Given 05/02/20 1051)  polyethylene glycol (MIRALAX / GLYCOLAX) packet 8.5 g ( Oral Not Given 05/02/20 1051)    Mobility walks Low fall risk   Focused Assessments    R Recommendations: See Admitting Provider  Note  Report given to:   Additional Notes:

## 2020-05-03 ENCOUNTER — Inpatient Hospital Stay (HOSPITAL_COMMUNITY): Payer: Medicare Other

## 2020-05-03 DIAGNOSIS — R509 Fever, unspecified: Secondary | ICD-10-CM | POA: Diagnosis present

## 2020-05-03 DIAGNOSIS — R270 Ataxia, unspecified: Secondary | ICD-10-CM | POA: Diagnosis not present

## 2020-05-03 DIAGNOSIS — R7989 Other specified abnormal findings of blood chemistry: Secondary | ICD-10-CM | POA: Diagnosis present

## 2020-05-03 DIAGNOSIS — E119 Type 2 diabetes mellitus without complications: Secondary | ICD-10-CM | POA: Diagnosis not present

## 2020-05-03 DIAGNOSIS — R778 Other specified abnormalities of plasma proteins: Secondary | ICD-10-CM | POA: Diagnosis present

## 2020-05-03 DIAGNOSIS — I639 Cerebral infarction, unspecified: Secondary | ICD-10-CM | POA: Diagnosis not present

## 2020-05-03 DIAGNOSIS — H53462 Homonymous bilateral field defects, left side: Secondary | ICD-10-CM | POA: Diagnosis not present

## 2020-05-03 DIAGNOSIS — I10 Essential (primary) hypertension: Secondary | ICD-10-CM | POA: Diagnosis not present

## 2020-05-03 DIAGNOSIS — H53139 Sudden visual loss, unspecified eye: Secondary | ICD-10-CM | POA: Diagnosis not present

## 2020-05-03 LAB — TRIGLYCERIDES: Triglycerides: 109 mg/dL (ref <150)

## 2020-05-03 LAB — CBC
HCT: 24.4 % — ABNORMAL LOW (ref 39.0–52.0)
HCT: 34.6 % — ABNORMAL LOW (ref 39.0–52.0)
Hemoglobin: 7.5 g/dL — ABNORMAL LOW (ref 13.0–17.0)
Hemoglobin: 10.7 g/dL — ABNORMAL LOW (ref 13.0–17.0)
MCH: 30.5 pg (ref 26.0–34.0)
MCH: 30.1 pg (ref 26.0–34.0)
MCHC: 30.7 g/dL (ref 30.0–36.0)
MCHC: 30.9 g/dL (ref 30.0–36.0)
MCV: 99.2 fL (ref 80.0–100.0)
MCV: 97.5 fL (ref 80.0–100.0)
Platelets: 172 10*3/uL (ref 150–400)
Platelets: 266 10*3/uL (ref 150–400)
RBC: 2.46 MIL/uL — ABNORMAL LOW (ref 4.22–5.81)
RBC: 3.55 MIL/uL — ABNORMAL LOW (ref 4.22–5.81)
RDW: 15.5 % (ref 11.5–15.5)
RDW: 16.1 % — ABNORMAL HIGH (ref 11.5–15.5)
WBC: 4.8 10*3/uL (ref 4.0–10.5)
WBC: 8.9 10*3/uL (ref 4.0–10.5)
nRBC: 0 % (ref 0.0–0.2)
nRBC: 0.6 % — ABNORMAL HIGH (ref 0.0–0.2)

## 2020-05-03 LAB — GLUCOSE, CAPILLARY
Glucose-Capillary: 127 mg/dL — ABNORMAL HIGH (ref 70–99)
Glucose-Capillary: 129 mg/dL — ABNORMAL HIGH (ref 70–99)
Glucose-Capillary: 152 mg/dL — ABNORMAL HIGH (ref 70–99)
Glucose-Capillary: 170 mg/dL — ABNORMAL HIGH (ref 70–99)
Glucose-Capillary: 178 mg/dL — ABNORMAL HIGH (ref 70–99)
Glucose-Capillary: 97 mg/dL (ref 70–99)

## 2020-05-03 LAB — PREPARE RBC (CROSSMATCH)

## 2020-05-03 LAB — COMPREHENSIVE METABOLIC PANEL
ALT: 18 U/L (ref 0–44)
AST: 24 U/L (ref 15–41)
Albumin: 3.5 g/dL (ref 3.5–5.0)
Alkaline Phosphatase: 70 U/L (ref 38–126)
Anion gap: 13 (ref 5–15)
BUN: 25 mg/dL — ABNORMAL HIGH (ref 8–23)
CO2: 19 mmol/L — ABNORMAL LOW (ref 22–32)
Calcium: 8.4 mg/dL — ABNORMAL LOW (ref 8.9–10.3)
Chloride: 107 mmol/L (ref 98–111)
Creatinine, Ser: 1.59 mg/dL — ABNORMAL HIGH (ref 0.61–1.24)
GFR calc Af Amer: 48 mL/min — ABNORMAL LOW (ref >60)
GFR calc non Af Amer: 42 mL/min — ABNORMAL LOW (ref >60)
Glucose, Bld: 283 mg/dL — ABNORMAL HIGH (ref 70–99)
Potassium: 4 mmol/L (ref 3.5–5.1)
Sodium: 139 mmol/L (ref 135–145)
Total Bilirubin: 1.3 mg/dL — ABNORMAL HIGH (ref 0.3–1.2)
Total Protein: 7 g/dL (ref 6.5–8.1)

## 2020-05-03 LAB — RENAL FUNCTION PANEL
Albumin: 3.1 g/dL — ABNORMAL LOW (ref 3.5–5.0)
Anion gap: 9 (ref 5–15)
BUN: 27 mg/dL — ABNORMAL HIGH (ref 8–23)
CO2: 22 mmol/L (ref 22–32)
Calcium: 8.5 mg/dL — ABNORMAL LOW (ref 8.9–10.3)
Chloride: 109 mmol/L (ref 98–111)
Creatinine, Ser: 1.34 mg/dL — ABNORMAL HIGH (ref 0.61–1.24)
GFR calc Af Amer: 60 mL/min — ABNORMAL LOW (ref >60)
GFR calc non Af Amer: 51 mL/min — ABNORMAL LOW (ref >60)
Glucose, Bld: 121 mg/dL — ABNORMAL HIGH (ref 70–99)
Phosphorus: 3.7 mg/dL (ref 2.5–4.6)
Potassium: 3.9 mmol/L (ref 3.5–5.1)
Sodium: 140 mmol/L (ref 135–145)

## 2020-05-03 LAB — PROTIME-INR
INR: 1 (ref 0.8–1.2)
Prothrombin Time: 13.2 seconds (ref 11.4–15.2)

## 2020-05-03 LAB — MAGNESIUM: Magnesium: 1.8 mg/dL (ref 1.7–2.4)

## 2020-05-03 LAB — BLOOD GAS, ARTERIAL
Acid-base deficit: 4.7 mmol/L — ABNORMAL HIGH (ref 0.0–2.0)
Bicarbonate: 20 mmol/L (ref 20.0–28.0)
FIO2: 100
O2 Saturation: 98.2 %
Patient temperature: 37
pCO2 arterial: 45.6 mmHg (ref 32.0–48.0)
pH, Arterial: 7.282 — ABNORMAL LOW (ref 7.350–7.450)
pO2, Arterial: 143 mmHg — ABNORMAL HIGH (ref 83.0–108.0)

## 2020-05-03 LAB — D-DIMER, QUANTITATIVE: D-Dimer, Quant: 3.83 ug/mL-FEU — ABNORMAL HIGH (ref 0.00–0.50)

## 2020-05-03 LAB — PHOSPHORUS: Phosphorus: 6.4 mg/dL — ABNORMAL HIGH (ref 2.5–4.6)

## 2020-05-03 LAB — BRAIN NATRIURETIC PEPTIDE: B Natriuretic Peptide: 1082 pg/mL — ABNORMAL HIGH (ref 0.0–100.0)

## 2020-05-03 LAB — LACTIC ACID, PLASMA
Lactic Acid, Venous: 2.9 mmol/L (ref 0.5–1.9)
Lactic Acid, Venous: 1.5 mmol/L (ref 0.5–1.9)

## 2020-05-03 LAB — APTT: aPTT: 27 seconds (ref 24–36)

## 2020-05-03 LAB — TROPONIN I (HIGH SENSITIVITY): Troponin I (High Sensitivity): 452 ng/L (ref <18)

## 2020-05-03 LAB — PROCALCITONIN: Procalcitonin: <0.1 ng/mL

## 2020-05-03 MED ORDER — PROPOFOL 1000 MG/100ML IV EMUL
5.0000 ug/kg/min | INTRAVENOUS | Status: DC
  Administered 2020-05-03: 5 ug/kg/min via INTRAVENOUS
  Administered 2020-05-04 (×2): 30 ug/kg/min via INTRAVENOUS
  Filled 2020-05-03 (×4): qty 100

## 2020-05-03 MED ORDER — IPRATROPIUM-ALBUTEROL 0.5-2.5 (3) MG/3ML IN SOLN
3.0000 mL | Freq: Four times a day (QID) | RESPIRATORY_TRACT | Status: DC | PRN
  Administered 2020-05-03: 3 mL via RESPIRATORY_TRACT
  Filled 2020-05-03: qty 3

## 2020-05-03 MED ORDER — MIDAZOLAM HCL 2 MG/2ML IJ SOLN
2.0000 mg | INTRAMUSCULAR | Status: DC | PRN
  Administered 2020-05-03 – 2020-05-04 (×2): 2 mg via INTRAVENOUS
  Filled 2020-05-03 (×2): qty 2

## 2020-05-03 MED ORDER — HEPARIN (PORCINE) 25000 UT/250ML-% IV SOLN
1550.0000 [IU]/h | INTRAVENOUS | Status: DC
  Administered 2020-05-04 – 2020-05-05 (×2): 1350 [IU]/h via INTRAVENOUS
  Filled 2020-05-03 (×3): qty 250

## 2020-05-03 MED ORDER — SODIUM CHLORIDE 0.9 % IV SOLN: INTRAVENOUS | Status: DC

## 2020-05-03 MED ORDER — SODIUM CHLORIDE 0.9 % IV SOLN
2.0000 g | Freq: Once | INTRAVENOUS | Status: DC
  Administered 2020-05-04: 2 g via INTRAVENOUS
  Filled 2020-05-03: qty 2

## 2020-05-03 MED ORDER — VANCOMYCIN HCL 1250 MG/250ML IV SOLN: 1250.0000 mg | INTRAVENOUS | Status: DC

## 2020-05-03 MED ORDER — FUROSEMIDE 10 MG/ML IJ SOLN
20.0000 mg | Freq: Once | INTRAMUSCULAR | Status: AC
  Administered 2020-05-03: 20 mg via INTRAVENOUS
  Filled 2020-05-03: qty 2

## 2020-05-03 MED ORDER — VANCOMYCIN HCL 2000 MG/400ML IV SOLN
2000.0000 mg | Freq: Once | INTRAVENOUS | Status: AC
  Administered 2020-05-03: 2000 mg via INTRAVENOUS
  Filled 2020-05-03 (×2): qty 400

## 2020-05-03 MED ORDER — CHLORHEXIDINE GLUCONATE CLOTH 2 % EX PADS
6.0000 | MEDICATED_PAD | Freq: Every day | CUTANEOUS | Status: DC
  Administered 2020-05-03 – 2020-05-16 (×14): 6 via TOPICAL

## 2020-05-03 MED ORDER — FENTANYL CITRATE (PF) 100 MCG/2ML IJ SOLN
25.0000 ug | INTRAMUSCULAR | Status: DC | PRN
  Administered 2020-05-03 – 2020-05-09 (×9): 25 ug via INTRAVENOUS
  Filled 2020-05-03 (×9): qty 2

## 2020-05-03 MED ORDER — IPRATROPIUM-ALBUTEROL 0.5-2.5 (3) MG/3ML IN SOLN: 3.0000 mL | Freq: Four times a day (QID) | RESPIRATORY_TRACT | Status: DC

## 2020-05-03 MED ORDER — SODIUM CHLORIDE 0.9 % IV BOLUS (SEPSIS)
250.0000 mL | Freq: Once | INTRAVENOUS | Status: AC
  Administered 2020-05-03: 250 mL via INTRAVENOUS

## 2020-05-03 MED ORDER — SODIUM CHLORIDE 0.9 % IV SOLN: 2.0000 g | Freq: Two times a day (BID) | INTRAVENOUS | Status: DC

## 2020-05-03 MED ORDER — APIXABAN 5 MG PO TABS
5.0000 mg | ORAL_TABLET | Freq: Two times a day (BID) | ORAL | Status: DC
  Administered 2020-05-03: 5 mg via ORAL
  Filled 2020-05-03: qty 1

## 2020-05-03 MED ORDER — SODIUM CHLORIDE 0.9% IV SOLUTION: Freq: Once | INTRAVENOUS | Status: DC

## 2020-05-03 NOTE — Progress Notes (Signed)
Pt called to nurses station with c/o sob. This nurse went to pt room and observed pt with short shallow breaths and stating "I can't breathe". O2 placed on pt via nasal cannula and RT notified. Rapid response called at 2030. Pt was intubated and transferred to ICU.

## 2020-05-03 NOTE — Progress Notes (Signed)
Pharmacy Antibiotic Note  Connor Bryant is a 74 y.o. male admitted on 05/01/2020 with sepsis.  Pharmacy has been consulted for vancomycin and cefepime dosing. He will also transition from eliquis to heparin for afib.  Last dose eliquis at 20:00  Plan: Vancomycin 2gm IV x 1 then 1250 mg IV q24 hours Cefepime 2gm IV q12 hours Start heparin drip at 08:00 at 1350 units/hr Check heparin level and aPTT 6-8 hours after start then daily Monitor renal function, cultures and clinical course  Height: 5\' 8"  (172.7 cm) Weight: 103.4 kg (228 lb) IBW/kg (Calculated) : 68.4  Temp (24hrs), Avg:99.2 F (37.3 C), Min:98.3 F (36.8 C), Max:101.7 F (38.7 C)  Recent Labs  Lab 05/01/20 1702 05/02/20 0400 05/03/20 0445 05/03/20 2132 05/03/20 2133  WBC 7.8 5.2 4.8  --  8.9  CREATININE 1.79* 1.54* 1.34*  --  1.59*  LATICACIDVEN  --   --   --  2.9*  --     Estimated Creatinine Clearance: 46.8 mL/min (A) (by C-G formula based on SCr of 1.59 mg/dL (H)).    No Known Allergies  Thank you for allowing pharmacy to be a part of this patient's care.  Excell Seltzer Poteet 05/03/2020 11:25 PM

## 2020-05-03 NOTE — Progress Notes (Signed)
TRH night shift.  The patient remains cardio vascularly stable, but critical. He has a new fever. I spoke to his son Marjory Lies and updated him about the increased temperature  with possible pneumonia lab results, elevated heart markers, cancellation of CTA chest due to worsening creatinine and GFR.  He and the patient's spouse will be visiting early tomorrow morning.  Tennis Must, MD

## 2020-05-03 NOTE — Evaluation (Addendum)
Physical Therapy Evaluation Patient Details Name: Connor Bryant MRN: [id number] DOB: [birth year] Today's Date: 05/03/2020   History of Present Illness  Connor Bryant is a 74 y.o. male with medical history significant for recent iliac stents, diabetes mellitus, hypertension. Patient presented to the ED today with complaints of losing vision in his left eye, started yesterday.  Patient woke up yesterday- Saturday morning with this, and this persisted today.  Reports it feels like a dark shadow on the peripheral side of his left eye. He also reports some tingling in his left arm-this has resolved.    Clinical Impression  The patient was independent in all transfers and ambulation today. Ambulation was limited to 40 feet due to fatigue and SOB, but the patient was able to successfully make it back to the bed without assistance. The patient had markedly decreased peripheral vision in L eye and displayed mild L sided weakness upon ambulation but was still within functional limits. The patient did not report any pain today. The patient was left in the bed with the call bell within arm's reach at the end of therapy. Patient discharged from physical therapy to care of nursing for ambulation daily as tolerated for length of stay.     Follow Up Recommendations Home health PT    Equipment Recommendations    None recommended by PT.    Recommendations for Other Services   None recommended by PT.     Precautions / Restrictions Precautions Precautions: None Restrictions Weight Bearing Restrictions: No      Mobility  Bed Mobility Overal bed mobility: Independent                Transfers Overall transfer level: Independent Equipment used: None                Ambulation/Gait Ambulation/Gait assistance: Supervision Gait Distance (Feet): 40 Feet (pt became SOB and fatigued) Assistive device: None Gait Pattern/deviations: Decreased stance time - left;Decreased step length -  left;WFL(Within Functional Limits)   Gait velocity interpretation: >2.62 ft/sec, indicative of community ambulatory    Stairs            Wheelchair Mobility    Modified Rankin (Stroke Patients Only)       Balance Overall balance assessment: Independent                                           Pertinent Vitals/Pain Pain Assessment: No/denies pain    Home Living Family/patient expects to be discharged to:: Private residence Living Arrangements: Spouse/significant other Available Help at Discharge: Family;Available PRN/intermittently Type of Home: House Home Access: Stairs to enter Entrance Stairs-Rails: None Entrance Stairs-Number of Steps: 1 Home Layout: One level Home Equipment: None      Prior Function Level of Independence: Independent         Comments: pt reports independence in mobility, ADLs, driving     Hand Dominance   Dominant Hand: Right    Extremity/Trunk Assessment   Upper Extremity Assessment Upper Extremity Assessment: Overall WFL for tasks assessed    Lower Extremity Assessment Lower Extremity Assessment: Overall WFL for tasks assessed    Cervical / Trunk Assessment Cervical / Trunk Assessment: Normal  Communication   Communication: No difficulties  Cognition Arousal/Alertness: Awake/alert Behavior During Therapy: WFL for tasks assessed/performed Overall Cognitive Status: Within Functional Limits for tasks assessed  General Comments      Exercises     Assessment/Plan    PT Assessment Patent does not need any further PT services  PT Problem List         PT Treatment Interventions      PT Goals (Current goals can be found in the Care Plan section)  Acute Rehab PT Goals Patient Stated Goal: go home PT Goal Formulation: With patient Time For Goal Achievement: 05/04/20 Potential to Achieve Goals: Good    Frequency     Barriers to discharge         Co-evaluation               AM-PAC PT "6 Clicks" Mobility  Outcome Measure Help needed turning from your back to your side while in a flat bed without using bedrails?: None Help needed moving from lying on your back to sitting on the side of a flat bed without using bedrails?: None Help needed moving to and from a bed to a chair (including a wheelchair)?: None Help needed standing up from a chair using your arms (e.g., wheelchair or bedside chair)?: None Help needed to walk in hospital room?: None Help needed climbing 3-5 steps with a railing? : None 6 Click Score: 24    End of Session   Activity Tolerance: Patient tolerated treatment well;Patient limited by lethargy (possibly due to low hemoglobin) Patient left: in bed;with call bell/phone within reach Nurse Communication: Mobility status;Weight bearing status PT Visit Diagnosis: Unsteadiness on feet (R26.81);Other abnormalities of gait and mobility (R26.89);Muscle weakness (generalized) (M62.81);Hemiplegia and hemiparesis Hemiplegia - Right/Left: Left Hemiplegia - dominant/non-dominant: Non-dominant Hemiplegia - caused by: Cerebral infarction    Time: 8887-5797 PT Time Calculation (min) (ACUTE ONLY): 20 min   Charges:   PT Evaluation $PT Eval Low Complexity: 1 Low PT Treatments $Therapeutic Activity: 8-22 mins        10:14 AM , 05/03/20 Karlyn Agee, SPT Physical Therapy with [place]  [facility] [phone] office   During this treatment session, the therapist was present, participating in and directing the treatment.  10:14 AM, 05/03/20 Lonell Grandchild, MPT Physical Therapist with [facility] [phone] office [phone] mobile phone

## 2020-05-03 NOTE — TOC Initial Note (Signed)
Transition of Care [facility]) - Initial/Assessment Note   Patient Details  Name: Connor Bryant MRN: [id number] Date of Birth: 05-21-1946  Transition of Care [facility] CM/SW Contact:    Sherie Don, LCSW Phone Number: 05/03/2020, 4:27 PM  Clinical Narrative: Patient is a 74 year old male who was admitted for acute CVA. Patient has a history of peripheral vascular disease, gout, hypertension, type 2 diabetes mellitus, hypercholesterolemia, S/P insertion of iliac artery stent, and unspecified atrial fibrillation. TOC received consult for HHPT. CSW made referrals to [facility], [facility], [facility], [facility], and [facility]. Only [facility] agreeable to referral. CSW updated hospitalist.  Expected Discharge Plan: [place] Barriers to Discharge: Continued Medical Work up, No [place] will accept this patient  Patient Goals and CMS Choice Patient states their goals for this hospitalization and ongoing recovery are:: Discharge home CMS Medicare.gov Compare Post Acute Care list provided to:: Patient Choice offered to / list presented to : Patient  Expected Discharge Plan and Services Expected Discharge Plan: [place] In-house Referral: Clinical Social Work Discharge Planning Services: NA Post Acute Care Choice: [place] arrangements for the past 2 months: [place]: PT [place]: [place] ([place]) Date [place]: 05/03/20 Time [place]: 1508 Representative spoke with at [place]: [place] Arrangements/Services Living arrangements for the past 2 months: [place] with:: Self Patient language and need for interpreter reviewed:: Yes Do you feel safe going back to the place where you live?: Yes      Need for Family Participation in Patient Care: No (Comment) Care giver support system in place?: Yes (comment) Dagmar Hait (son)) Criminal Activity/Legal Involvement Pertinent  to Current Situation/Hospitalization: No - Comment as needed  Activities of Daily Living Home Assistive Devices/Equipment: None ADL Screening (condition at time of admission) Patient's cognitive ability adequate to safely complete daily activities?: Yes Is the patient deaf or have difficulty hearing?: No Does the patient have difficulty seeing, even when wearing glasses/contacts?: No Does the patient have difficulty concentrating, remembering, or making decisions?: No Patient able to express need for assistance with ADLs?: Yes Does the patient have difficulty dressing or bathing?: No Independently performs ADLs?: Yes (appropriate for developmental age) Does the patient have difficulty walking or climbing stairs?: No Weakness of Legs: None Weakness of Arms/Hands: None  Permission Sought/Granted Permission sought to share information with : Customer service manager, Family Supports  Permission granted to share info w AGENCY: [facility] providers  Emotional Assessment Appearance:: Appears stated age Orientation: : Oriented to Self, Oriented to Place, Oriented to  Time, Oriented to Situation Alcohol / Substance Use: Not Applicable Psych Involvement: No (comment)  Admission diagnosis:  Homonymous hemianopia, left [H53.462] Acute CVA (cerebrovascular accident) ([place]) [I63.9] Cerebrovascular accident (CVA), unspecified mechanism ([place]) [I63.9] Patient Active Problem List   Diagnosis Date Noted  . Unspecified atrial fibrillation ([place]) 05/02/2020  . Acute CVA (cerebrovascular accident) ([place]) 05/01/2020  . S/P insertion of iliac artery stent 05/01/2020  . Compression fracture of T12 vertebra ([place]) 10/27/2018  . Spinal stenosis of lumbosacral region 10/27/2018  . Small bowel obstruction ([place]) 09/30/2018  . Back pain of lumbar region with sciatica 08/26/2018  . Chronic left-sided low back pain with left-sided sciatica 08/26/2018  . Tremor 08/26/2018  . Pure hypercholesterolemia 02/25/2018   . Atherosclerosis of aorta ([place]) 02/24/2018  . Bilateral hip pain 06/24/2017  . Body mass index 30.0-30.9, adult 06/06/2016  . Gout  06/06/2016  . Essential hypertension 06/06/2016  . Type 2 diabetes mellitus without complication, without long-term current use of insulin ([place]) 06/06/2016  . Chronic mesenteric ischemia ([place]) 03/01/2014  . Peripheral vascular disease ([place]) 02/16/2014  . 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 Personal history of colonic polyp-adenoma 11/24/2013  . Diarrhea 10/21/2013   PCP:  Claretta Fraise, MD Pharmacy:   [facility] [place] [address] Phone: [phone] Fax: [phone]  Readmission Risk Interventions No flowsheet data found.

## 2020-05-03 NOTE — Progress Notes (Signed)
TRH NIGHT SHIFT.  The patient was seen after a rapid response code was called due to becoming suddenly hypoxic in the 50s while going to the bathroom. He was assisted by the nursing and RT staff, put on a nonrebreather mask with 100% oxygen, subsequently being Ambu bagged and intubated. Patient was transiently tachycardic in the 140s and his BP rose to the 160/69 mmHg mark. He requiring endotracheal intubation and mechanical ventilation. He subsequently developed a fever 100.8 F and sepsis/SIRS protocol started. He remains intubated, mechanical ventilation and under sedation.  HEENT. Anicteric. Neck: Supple, no JVD. Lungs: Good post intubation air entry with bilateral rhonchi and bibasilar crackles. Heart: S1-S2, RRR Abdomen: Soft, nontender. Extremities: No edema, no clubbing or cyanosis. Skin: Ecchymosis on both inguinal areas, R>>L. Neuro: Currently sedated.  Vent. rate 147 BPM PR interval * ms QRS duration 96 ms QT/QTc 340/532 ms P-R-T axes * 80 Sinus tachycardia with Premature atrial complexes Incomplete right bundle branch block Anteroseptal infarct , age undetermined Abnormal ECG  Heparin level (unfractionated) [[id number]] (Abnormal)   Collected: 05/03/20 2133   Updated: 05/04/20 0007   Specimen Type: Blood    Heparin Unfractionated <0.10Low IU/mL  Lactic acid, plasma [[id number]]   Collected: 05/03/20 2321   Updated: 05/03/20 2355   Specimen Type: Blood    Lactic Acid, Venous 1.5 mmol/L  Glucose, capillary [[id number]] (Abnormal)   Collected: 05/03/20 2348   Updated: 05/03/20 2349    Glucose-Capillary 178High mg/dL   Comment 1 Notify RN   Comment 2 Document in Chart  MRSA PCR Screening [[id number]]   Collected: 05/03/20 2146   Updated: 05/03/20 2336   Specimen Type: Nasopharyngeal   Specimen Source: Nasal Mucosa   Culture, blood (x 2) [[id number]]   Collected: 05/03/20 2326   Updated: 05/03/20 2334   Specimen Type: Blood   Culture, blood (x 2) [[id number]]    Collected: 05/03/20 2321   Updated: 05/03/20 2333   Specimen Type: Blood   Procalcitonin - Baseline [[id number]]   Collected: 05/03/20 2133   Updated: 05/03/20 2330    Procalcitonin <0.10 ng/mL  APTT [[id number]]   Collected: 05/03/20 2133   Updated: 05/03/20 2316   Specimen Type: Blood    aPTT 27 seconds  CBC [[id number]] (Abnormal)   Collected: 05/03/20 2133   Updated: 05/03/20 2301   Specimen Type: Blood    WBC 8.9 K/uL   RBC 3.55Low MIL/uL   Hemoglobin 10.7Low g/dL   HCT 34.6Low %   MCV 97.5 fL   MCH 30.1 pg   MCHC 30.9 g/dL   RDW 16.1High %   Platelets 266 K/uL   nRBC 0.6High %  D-dimer, quantitative (not at [facility]) [[id number]] (Abnormal)   Collected: 05/03/20 2133   Updated: 05/03/20 2257   Specimen Type: Blood    D-Dimer, Quant 3.83High ug/mL-FEU  Brain natriuretic peptide [[id number]] (Abnormal)   Collected: 05/03/20 2133   Updated: 05/03/20 2249   Specimen Type: Blood    B Natriuretic Peptide 1,082.0High pg/mL  Troponin I (High Sensitivity) [[id number]] (Abnormal)   Collected: 05/03/20 2133   Updated: 05/03/20 2238    Troponin I (High Sensitivity) 452High Panic  ng/L  Magnesium [[id number]]   Collected: 05/03/20 2133   Updated: 05/03/20 2238   Specimen Type: Blood    Magnesium 1.8 mg/dL  Phosphorus [[id number]] (Abnormal)   Collected: 05/03/20 2133   Updated: 05/03/20 2238   Specimen Type: Blood    Phosphorus 6.4High mg/dL  Comprehensive metabolic panel [[id number]] (Abnormal)   Collected: 05/03/20 2133   Updated:  05/03/20 2238    Sodium 139 mmol/L   Potassium 4.0 mmol/L   Chloride 107 mmol/L   CO2 19Low mmol/L   Glucose, Bld 283High mg/dL   BUN 25High mg/dL   Creatinine, Ser 1.59High mg/dL   Calcium 8.4Low mg/dL   Total Protein 7.0 g/dL   Albumin 3.5 g/dL   AST 24 U/L   ALT 18 U/L   Alkaline Phosphatase 70 U/L   Total Bilirubin 1.3High mg/dL   GFR calc non Af Amer 42Low mL/min   GFR calc Af Amer 48Low mL/min    Anion gap 13  Lactic acid, plasma [[id number]] (Abnormal)   Collected: 05/03/20 2132   Updated: 05/03/20 2234   Specimen Type: Blood    Lactic Acid, Venous 2.9High Panic  mmol/L  Triglycerides [[id number]]   Collected: 05/03/20 2133   Updated: 05/03/20 2228   Specimen Type: Blood    Triglycerides 109 mg/dL  Protime-INR [[id number]]   Collected: 05/03/20 2133   Updated: 05/03/20 2209   Specimen Type: Blood    Prothrombin Time 13.2 seconds   INR 1.0  Blood gas, arterial [[id number]] (Abnormal)   Collected: 05/03/20 2125   Updated: 05/03/20 2143   Specimen Type: Blood, Arterial    FIO2 100.00   pH, Arterial 7.282Low   pCO2 arterial 45.6 mmHg   pO2, Arterial 143High mmHg   Bicarbonate 20.0 mmol/L   Acid-base deficit 4.7High mmol/L   O2 Saturation 98.2 %   Patient temperature 37.0   Allens test (pass/fail) BRACHIAL ARTERYAbnormal   ECHO 05/02/2020  IMPRESSIONS  1. Left ventricular ejection fraction, by estimation, is 60 to 65%. The  left ventricle has normal function. Left ventricular endocardial border  not optimally defined to evaluate regional wall motion. The left  ventricular internal cavity size was mildly  dilated. There is mild left ventricular hypertrophy. Left ventricular  diastolic parameters are indeterminate.  2. Right ventricular systolic function is normal. The right ventricular  size is normal.  3. The mitral valve is abnormal. Trivial mitral valve regurgitation.  4. AV is thickened, calcified with mildly restricted motion. Peak and  mean gradients through the valve are 31 and 18 mm Hg respectively. . The  aortic valve is abnormal. Aortic valve regurgitation is not visualized.   CLINICAL DATA:  Shortness of breath, intubated  EXAM: PORTABLE CHEST 1 VIEW  COMPARISON:  05/03/2020  FINDINGS: Endotracheal tube is 4 cm above the carina. NG tube enters the stomach. Bilateral perihilar airspace opacities and interstitial prominence concerning  for edema. Heart is borderline in size. No visible effusions or acute bony abnormality.  IMPRESSION: Bilateral perihilar opacities and diffuse interstitial prominence, likely edema/CHF.   Electronically Signed   By: Rolm Baptise M.D.   On: 05/03/2020 21:29  Assessment/plan:  Acute respiratory failure with hypoxia. Continue mechanical ventilation. Likely seems to be volume overload. Procalcitonin has come back normal. (I will discontinue antibiotics). Dimer is elevated, but already on anticoagulation. I will defer CTA chest given worsening of her renal function. Furosemide 20 mg IVP given. Discussed extensively with family members in ICU waiting room.  Tennis Must, MD  About 60 minutes minutes of critical care time were spent during the process of these emergent events.  This document was prepared using Dragon voice recognition software and may contain some unintended transcription errors.

## 2020-05-03 NOTE — Progress Notes (Signed)
Upon arrival to patients room (called for shortness of breath and PRN treatment) patient found to be 52% on 4 lpm nasal cannula after ambulating to bathroom on room air. While doing assessment of patient RT and RN decided to call a rapid response due to patients condition. ECG machine obtained. During ECG patient became unresponsive and had agonal breaths. Began bagging patient manually with ambu bag. ED physician and hospitalist arrived and at bedside during intubation procedure. Patient transport to ICU for further care.

## 2020-05-03 NOTE — Evaluation (Signed)
Occupational Therapy Evaluation Patient Details Name: Connor Bryant MRN: [id number] DOB: [birth year] Today's Date: 05/03/2020    History of Present Illness Connor Bryant is a 74 y.o. male with medical history significant for recent iliac stents, diabetes mellitus, hypertension. Patient presented to the ED today with complaints of losing vision in his left eye, started yesterday.  Patient woke up yesterday- Saturday morning with this, and this persisted today.  Reports it feels like a dark shadow on the peripheral side of his left eye. He also reports some tingling in his left arm-this has resolved.   Clinical Impression   Pt agreeable to OT evaluation. Pt reports improvement in vision in left eye-not as dark. Pt presents with left homonymous hemianopsia upon assessment. Educated pt on developing the habit of turning his head fully to the left prior to mobility to assess the environment and look for objects or obstacles. Pt verbalized understanding. BUE strength is WNL, coordination and sensation are intact. No further OT services required at this time.     Follow Up Recommendations  No OT follow up    Equipment Recommendations  None recommended by OT       Precautions / Restrictions Precautions Precautions: None Restrictions Weight Bearing Restrictions: No      Mobility Bed Mobility Overal bed mobility: Independent                Transfers Overall transfer level: Independent Equipment used: None                      ADL either performed or assessed with clinical judgement   ADL Overall ADL's : Modified independent;At baseline                                       General ADL Comments: pt performing ADLs independently, without difficulty     Vision Baseline Vision/History: Wears glasses Wears Glasses: At all times Patient Visual Report: Peripheral vision impairment Vision Assessment?: Yes Eye Alignment: Within Functional Limits Ocular  Range of Motion: Within Functional Limits Alignment/Gaze Preference: Within Defined Limits Tracking/Visual Pursuits: Able to track stimulus in all quads without difficulty Saccades: Within functional limits Convergence: Within functional limits Visual Fields: Left homonymous hemianopsia            Pertinent Vitals/Pain Pain Assessment: No/denies pain     Hand Dominance Right   Extremity/Trunk Assessment Upper Extremity Assessment Upper Extremity Assessment: Overall WFL for tasks assessed   Lower Extremity Assessment Lower Extremity Assessment: Defer to PT evaluation   Cervical / Trunk Assessment Cervical / Trunk Assessment: Normal   Communication Communication Communication: No difficulties   Cognition Arousal/Alertness: Awake/alert Behavior During Therapy: WFL for tasks assessed/performed Overall Cognitive Status: Within Functional Limits for tasks assessed                                                Home Living Family/patient expects to be discharged to:: Private residence Living Arrangements: Spouse/significant other Available Help at Discharge: Family;Available PRN/intermittently Type of Home: House Home Access: Stairs to enter [employer] of Steps: 1 Entrance Stairs-Rails: None Home Layout: One level     Bathroom Shower/Tub: Occupational psychologist: Standard     Home Equipment: Cane - single point  Prior Functioning/Environment Level of Independence: Independent        Comments: pt reports independence in mobility, ADLs, driving        OT Problem List: Impaired vision/perception       End of Session    Activity Tolerance: Patient tolerated treatment well Patient left: in bed;with call bell/phone within reach  OT Visit Diagnosis: Muscle weakness (generalized) (M62.81)                Time: 5809-9833 OT Time Calculation (min): 11 min Charges:  OT General Charges $OT Visit: 1 Visit OT  Evaluation $OT Eval Low Complexity: [place], OTR/L  [phone] 05/03/2020, 8:12 AM

## 2020-05-03 NOTE — Progress Notes (Signed)
MD aware of troponin and lactic acid. Will also hold off on CTA for now per MD.

## 2020-05-03 NOTE — Progress Notes (Signed)
PROGRESS NOTE    Connor Bryant  EVO:[id number] DOB: 1946-03-25 DOA: 05/01/2020 PCP: Claretta Fraise, MD    Brief Narrative:  HPI: Connor Bryant is a 74 y.o. male with medical history significant for recent iliac stents, diabetes mellitus, hypertension. Patient presented to the ED today with complaints of losing vision in his left eye, started yesterday.  Patient woke up yesterday- Saturday morning with this, and this persisted today.  Reports it feels like a dark shadow on the peripheral side of his left eye. He also reports some tingling in his left arm-this has resolved.  No strokes.  He denies weakness of his extremities.  No change in speech, no facial asymmetry.  Patient took his aspirin and Plavix and statins today. Patient had bilateral iliac stents, 2 on the right, one on the left placed about 5 days ago at [facility].  The only cardiac history patient is aware of is a cardiac murmur.  He is not aware of history of irregular heart rhythm.  ED Course: Blood pressure systolic 1 1 5-1 2 7, heart rate 49-59, hemoglobin 8.6, recent baseline about 11.  Creatinine 1.7, about baseline.  Head CT shows acute right occipital infarct.  EKG showing atrial fibrillation.  Telemetry neurology consulted-patient outside TPA window, stroke work-up.  Dual antiplatelet for now.  Hospitalist to admit for acute CVA.   Assessment & Plan:   Principal Problem:   Acute CVA (cerebrovascular accident) [facility]) Active Problems:   Peripheral vascular disease ([place])   Gout   Essential hypertension   Type 2 diabetes mellitus without complication, without long-term current use of insulin ([place])   Pure hypercholesterolemia   S/P insertion of iliac artery stent   Unspecified atrial fibrillation ([place])   1. Acute CVA.  Patient presented with lateral field vision loss of the left eye.  CT head confirmed acute right occipital infarct.  He was outside the window of TPA.  MRI/MRA head ordered that showed  widespread atherosclerotic disease.  Echocardiogram has been ordered with report pending.  PT OT eval's have also been ordered.  He passed a swallow evaluation.  LDL of 54, A1c of 6.2.  He is already on statin.  He was taken dual antiplatelet therapy prior to admission.  Neurology consulted.  In light of new onset A. fib, he would need to be started on anticoagulation.  Case discussed with his vascular surgeon, Dr. Rosana Hoes at [facility]  Recommended that if patient is to be started on DOAC, he would recommend continuing Plavix and would be okay with discontinuing aspirin. 2. New onset atrial fibrillation.  CHADsVAsc score is 7.  Currently, heart rate is controlled.  Will start on eliquis. 3. Recent bilateral iliac stents.  Patient had bilateral iliac stents placed 04/27/2020.  Continue on plavix. 4. Chronic kidney disease stage IIIa.  Creatinine is near baseline.  Continue to monitor. 5. Diabetes mellitus, non-insulin-dependent, with nephropathy.  Chronically on Metformin, pioglitazone.  Holding oral agents.  Continue on sliding scale insulin. Blood sugars are stable 6. Hypertension.  Chronically on hydralazine, hydrochlorothiazide, verapamil, and tandolapril.  These medications are on hold to allow for permissive hypertension. 7. Symptomatic anemia. Patient noted to have iron deficiency anemia.  He does not have any signs of bleeding.  Denies any melena or hematochezia.  Stool occult blood was found be negative.  Since he is short of breath on exertion, will transfuse 1 unit PRBC. 8. COPD.  Patient has a long history of tobacco use.  Likely  has some degree of COPD.  May benefit with outpatient follow-up with pulmonology for PFTs.   DVT prophylaxis: eliquis Code Status: Full code Family Communication: Discussed with patient Disposition Plan: Status is: Inpatient  Remains inpatient appropriate because:Ongoing diagnostic testing needed not appropriate for outpatient work up   Dispo: The patient is  from: Home              Anticipated d/c is to: Home              Anticipated d/c date is: 1 day              Patient currently is not medically stable to d/c.    Consultants:   Neurology  Procedures:   Echo: 1. Left ventricular ejection fraction, by estimation, is 60 to 65%. The  left ventricle has normal function. Left ventricular endocardial border  not optimally defined to evaluate regional wall motion. The left  ventricular internal cavity size was mildly  dilated. There is mild left ventricular hypertrophy. Left ventricular  diastolic parameters are indeterminate.  2. Right ventricular systolic function is normal. The right ventricular  size is normal.  3. The mitral valve is abnormal. Trivial mitral valve regurgitation.  4. AV is thickened, calcified with mildly restricted motion. Peak and  mean gradients through the valve are 31 and 18 mm Hg respectively. . The  aortic valve is abnormal. Aortic valve regurgitation is not visualized.   Antimicrobials:       Subjective: Vision in left eye is unchanged. Patient reports some shortness of breath on exertion.  He has not noted any melena or hematochezia.  Objective: Vitals:   05/03/20 0457 05/03/20 0618 05/03/20 1600 05/03/20 1629  BP: (!) 148/44 (!) 159/60 (!) 163/55 (!) 163/64  Pulse: 68 74 85 85  Resp: 18 18 18 18   Temp: 98.4 F (36.9 C) 99 F (37.2 C) 98.9 F (37.2 C) 99.1 F (37.3 C)  TempSrc: Oral Oral Oral Oral  SpO2: 96% 91% 96% 96%  Weight:      Height:       No intake or output data in the 24 hours ending 05/03/20 1736 Filed Weights   05/01/20 1641  Weight: 103.4 kg    Examination:  General exam: Appears calm and comfortable  Respiratory system: Clear to auscultation. Respiratory effort normal. Cardiovascular system: S1 & S2 heard, RRR. No JVD, murmurs, rubs, gallops or clicks. No pedal edema. Gastrointestinal system: Abdomen is nondistended, soft and nontender. No organomegaly or masses  felt. Normal bowel sounds heard. Central nervous system: Alert and oriented. No focal neurological deficits. Extremities: Symmetric 5 x 5 power. Skin: No rashes, lesions or ulcers Psychiatry: Judgement and insight appear normal. Mood & affect appropriate.     Data Reviewed: I have personally reviewed following labs and imaging studies  CBC: Recent Labs  Lab 05/01/20 1702 05/02/20 0400 05/03/20 0445  WBC 7.8 5.2 4.8  NEUTROABS 5.9  --   --   HGB 8.6* 8.0* 7.5*  HCT 26.5* 25.2* 24.4*  MCV 96.4 97.7 99.2  PLT 211 176 244   Basic Metabolic Panel: Recent Labs  Lab 05/01/20 0502 05/01/20 1702 05/02/20 0400 05/03/20 0445  NA  --  136 138 140  K  --  4.0 3.6 3.9  CL  --  104 106 109  CO2  --  18* 21* 22  GLUCOSE  --  174* 122* 121*  BUN  --  40* 36* 27*  CREATININE  --  1.79* 1.54*  1.34*  CALCIUM  --  9.1 8.8* 8.5*  MG 1.8  --   --   --   PHOS  --   --   --  3.7   GFR: Estimated Creatinine Clearance: 55.5 mL/min (A) (by C-G formula based on SCr of 1.34 mg/dL (H)). Liver Function Tests: Recent Labs  Lab 05/01/20 1702 05/03/20 0445  AST 23  --   ALT 20  --   ALKPHOS 60  --   BILITOT 1.0  --   PROT 7.2  --   ALBUMIN 3.7 3.1*   No results for input(s): LIPASE, AMYLASE in the last 168 hours. No results for input(s): AMMONIA in the last 168 hours. Coagulation Profile: Recent Labs  Lab 05/01/20 1702  INR 1.1   Cardiac Enzymes: No results for input(s): CKTOTAL, CKMB, CKMBINDEX, TROPONINI in the last 168 hours. BNP (last 3 results) No results for input(s): PROBNP in the last 8760 hours. HbA1C: Recent Labs    05/02/20 0400  HGBA1C 6.2*   CBG: Recent Labs  Lab 05/02/20 1643 05/02/20 2153 05/03/20 0816 05/03/20 1115 05/03/20 1635  GLUCAP 137* 125* 129* 127* 97   Lipid Profile: Recent Labs    05/02/20 0400  CHOL 126  HDL 58  LDLCALC 54  TRIG 68  CHOLHDL 2.2   Thyroid Function Tests: Recent Labs    05/01/20 2021  TSH 5.072*   Anemia  Panel: Recent Labs    05/02/20 0400  FERRITIN 83  TIBC 313  IRON 38*   Sepsis Labs: No results for input(s): PROCALCITON, LATICACIDVEN in the last 168 hours.  Recent Results (from the past 240 hour(s))  SARS Coronavirus 2 by RT PCR (hospital order, performed in [facility] hospital lab) Nasopharyngeal Nasopharyngeal Swab     Status: None   Collection Time: 05/01/20  7:43 PM   Specimen: Nasopharyngeal Swab  Result Value Ref Range Status   SARS Coronavirus 2 NEGATIVE NEGATIVE Final    Comment: (NOTE) SARS-CoV-2 target nucleic acids are NOT DETECTED.  The SARS-CoV-2 RNA is generally detectable in upper and lower respiratory specimens during the acute phase of infection. The lowest concentration of SARS-CoV-2 viral copies this assay can detect is 250 copies / mL. A negative result does not preclude SARS-CoV-2 infection and should not be used as the sole basis for treatment or other patient management decisions.  A negative result may occur with improper specimen collection / handling, submission of specimen other than nasopharyngeal swab, presence of viral mutation(s) within the areas targeted by this assay, and inadequate number of viral copies (<250 copies / mL). A negative result must be combined with clinical observations, patient history, and epidemiological information.  Fact Sheet for Patients:   StrictlyIdeas.no  Fact Sheet for Healthcare Providers: BankingDealers.co.za  This test is not yet approved or  cleared by the [place] FDA and has been authorized for detection and/or diagnosis of SARS-CoV-2 by FDA under an Emergency Use Authorization (EUA).  This EUA will remain in effect (meaning this test can be used) for the duration of the COVID-19 declaration under Section 564(b)(1) of the Act, 21 U.S.C. section 360bbb-3(b)(1), unless the authorization is terminated or revoked sooner.  Performed at [facility],  [address] [place], [place] [address]          Radiology Studies: DG Chest 2 View  Result Date: 05/03/2020 CLINICAL DATA:  Shortness of breath EXAM: CHEST - 2 VIEW COMPARISON:  03/15/2014 FINDINGS: Cardiac shadow is mildly prominent. Aortic calcifications are noted.  Pleural calcifications are seen in the apices. No focal infiltrate or sizable effusion is seen. Degenerative changes of the thoracic spine are noted. Old left clavicular fracture with healing is seen. IMPRESSION: No acute abnormality noted. Electronically Signed   By: Inez Catalina M.D.   On: 05/03/2020 13:33   MR ANGIO HEAD WO CONTRAST  Addendum Date: 05/02/2020   ADDENDUM REPORT: 05/02/2020 11:48 ADDENDUM: These results were called by telephone at the time of interpretation on 05/02/2020 at 11:47 am to provider Dr. Kathie Dike , who verbally acknowledged these results. Electronically Signed   By: Primitivo Gauze M.D.   On: 05/02/2020 11:48   Result Date: 05/02/2020 CLINICAL DATA:  Neuro deficit. EXAM: MR HEAD WITHOUT CONTRAST MR CIRCLE OF WILLIS WITHOUT CONTRAST MRA OF THE NECK WITHOUT CONTRAST TECHNIQUE: Multiplanar, multiecho pulse sequences of the brain, circle of willis and surrounding structures were obtained without intravenous contrast. Angiographic images of the neck were obtained using MRA technique without intravenous contrast. COMPARISON:  05/01/2020 head CT. FINDINGS: MR HEAD FINDINGS Brain: Acute right occipital infarct. No intracranial hemorrhage. No midline shift, ventriculomegaly or extra-axial fluid collection. No mass lesion. Vascular: Please see MRA head. Skull and upper cervical spine: Normal marrow signal. Sinuses/Orbits: Normal orbits. Clear paranasal sinuses. Trace bilateral mastoid effusions. Other: None. MR CIRCLE OF WILLIS FINDINGS Image quality is degraded by motion artifact. Anterior circulation: Left A1 segment hypoplasia. Mild left A1 segment irregularity likely reflects atheromatous disease. Mild right and  moderate to severe narrowing of the distal left ICA cervical segments. Vessel wall irregularity involving the petrous and intracavernous ICAs likely reflects atheromatous disease versus motion artifact. Short segment moderate to severe narrowing involving the proximal right and distal bilateral intra cavernous ICAs (1022:9). The bilateral intracavernous ICAs demonstrates short segment, mild pre-stenotic dilatation (1022: 8). No vascular malformation. Posterior circulation: Dominant left vertebral artery. Diminutive appearance of the right V4 segment after PICA takeoff. Atheromatous disease. No high-grade narrowing, occlusion or aneurysm involving the bilateral PCAs. Small focal outpouching involving the right PCA reflects an infundibulum. No significant stenosis, aneurysm, or vascular malformation. Venous sinuses: No evidence of thrombosis. Anatomic variants: Bilateral PCOM hypoplasia. MRA NECK FINDINGS Image quality is degraded by motion artifact. Three-vessel aortic arch. There is no high-grade narrowing or focal aneurysm involving the bilateral common or right internal carotid artery. Approximately 40% luminal narrowing of the proximal left ICA secondary to atheromatous disease. Diminutive appearance of the right vertebral artery. Proximal to the level of the glottis the right vertebral artery is not visualized, concerning for proximal occlusion. The left vertebral artery is patent and demonstrates antegrade flow. No evidence of high-grade narrowing or focal aneurysm involving left vertebral artery. Dominant left vertebral artery. IMPRESSION: 1. Acute right occipital infarct. 2. Nonvisualization of the proximal right vertebral artery inferior to the level of the glottis, concerning for occlusion. 3. Atheromatous disease with multifocal intracranial stenoses most notably is follows. 4. Short-segment moderate to severe proximal right and distal bilateral intracavernous ICA narrowing. Both of the intracavernous ICAs  demonstrate mild short-segment pre stenotic dilatation. 5. Moderate to severe short segment narrowing of the distal left ICA cervical segment. 6. Approximately 40% luminal narrowing of the proximal left ICA, distal to the bifurcation. 7. Please note that motion artifact on MRA head and neck imaging limits evaluation. Electronically Signed: By: Primitivo Gauze M.D. On: 05/02/2020 11:41   MR ANGIO NECK WO CONTRAST  Addendum Date: 05/02/2020   ADDENDUM REPORT: 05/02/2020 11:48 ADDENDUM: These results were called by telephone at the time  of interpretation on 05/02/2020 at 11:47 am to provider Dr. Kathie Dike , who verbally acknowledged these results. Electronically Signed   By: Primitivo Gauze M.D.   On: 05/02/2020 11:48   Result Date: 05/02/2020 CLINICAL DATA:  Neuro deficit. EXAM: MR HEAD WITHOUT CONTRAST MR CIRCLE OF WILLIS WITHOUT CONTRAST MRA OF THE NECK WITHOUT CONTRAST TECHNIQUE: Multiplanar, multiecho pulse sequences of the brain, circle of willis and surrounding structures were obtained without intravenous contrast. Angiographic images of the neck were obtained using MRA technique without intravenous contrast. COMPARISON:  05/01/2020 head CT. FINDINGS: MR HEAD FINDINGS Brain: Acute right occipital infarct. No intracranial hemorrhage. No midline shift, ventriculomegaly or extra-axial fluid collection. No mass lesion. Vascular: Please see MRA head. Skull and upper cervical spine: Normal marrow signal. Sinuses/Orbits: Normal orbits. Clear paranasal sinuses. Trace bilateral mastoid effusions. Other: None. MR CIRCLE OF WILLIS FINDINGS Image quality is degraded by motion artifact. Anterior circulation: Left A1 segment hypoplasia. Mild left A1 segment irregularity likely reflects atheromatous disease. Mild right and moderate to severe narrowing of the distal left ICA cervical segments. Vessel wall irregularity involving the petrous and intracavernous ICAs likely reflects atheromatous disease versus motion  artifact. Short segment moderate to severe narrowing involving the proximal right and distal bilateral intra cavernous ICAs (1022:9). The bilateral intracavernous ICAs demonstrates short segment, mild pre-stenotic dilatation (1022: 8). No vascular malformation. Posterior circulation: Dominant left vertebral artery. Diminutive appearance of the right V4 segment after PICA takeoff. Atheromatous disease. No high-grade narrowing, occlusion or aneurysm involving the bilateral PCAs. Small focal outpouching involving the right PCA reflects an infundibulum. No significant stenosis, aneurysm, or vascular malformation. Venous sinuses: No evidence of thrombosis. Anatomic variants: Bilateral PCOM hypoplasia. MRA NECK FINDINGS Image quality is degraded by motion artifact. Three-vessel aortic arch. There is no high-grade narrowing or focal aneurysm involving the bilateral common or right internal carotid artery. Approximately 40% luminal narrowing of the proximal left ICA secondary to atheromatous disease. Diminutive appearance of the right vertebral artery. Proximal to the level of the glottis the right vertebral artery is not visualized, concerning for proximal occlusion. The left vertebral artery is patent and demonstrates antegrade flow. No evidence of high-grade narrowing or focal aneurysm involving left vertebral artery. Dominant left vertebral artery. IMPRESSION: 1. Acute right occipital infarct. 2. Nonvisualization of the proximal right vertebral artery inferior to the level of the glottis, concerning for occlusion. 3. Atheromatous disease with multifocal intracranial stenoses most notably is follows. 4. Short-segment moderate to severe proximal right and distal bilateral intracavernous ICA narrowing. Both of the intracavernous ICAs demonstrate mild short-segment pre stenotic dilatation. 5. Moderate to severe short segment narrowing of the distal left ICA cervical segment. 6. Approximately 40% luminal narrowing of the  proximal left ICA, distal to the bifurcation. 7. Please note that motion artifact on MRA head and neck imaging limits evaluation. Electronically Signed: By: Primitivo Gauze M.D. On: 05/02/2020 11:41   MR BRAIN WO CONTRAST  Addendum Date: 05/02/2020   ADDENDUM REPORT: 05/02/2020 11:48 ADDENDUM: These results were called by telephone at the time of interpretation on 05/02/2020 at 11:47 am to provider Dr. Kathie Dike , who verbally acknowledged these results. Electronically Signed   By: Primitivo Gauze M.D.   On: 05/02/2020 11:48   Result Date: 05/02/2020 CLINICAL DATA:  Neuro deficit. EXAM: MR HEAD WITHOUT CONTRAST MR CIRCLE OF WILLIS WITHOUT CONTRAST MRA OF THE NECK WITHOUT CONTRAST TECHNIQUE: Multiplanar, multiecho pulse sequences of the brain, circle of willis and surrounding structures were obtained without intravenous contrast.  Angiographic images of the neck were obtained using MRA technique without intravenous contrast. COMPARISON:  05/01/2020 head CT. FINDINGS: MR HEAD FINDINGS Brain: Acute right occipital infarct. No intracranial hemorrhage. No midline shift, ventriculomegaly or extra-axial fluid collection. No mass lesion. Vascular: Please see MRA head. Skull and upper cervical spine: Normal marrow signal. Sinuses/Orbits: Normal orbits. Clear paranasal sinuses. Trace bilateral mastoid effusions. Other: None. MR CIRCLE OF WILLIS FINDINGS Image quality is degraded by motion artifact. Anterior circulation: Left A1 segment hypoplasia. Mild left A1 segment irregularity likely reflects atheromatous disease. Mild right and moderate to severe narrowing of the distal left ICA cervical segments. Vessel wall irregularity involving the petrous and intracavernous ICAs likely reflects atheromatous disease versus motion artifact. Short segment moderate to severe narrowing involving the proximal right and distal bilateral intra cavernous ICAs (1022:9). The bilateral intracavernous ICAs demonstrates short  segment, mild pre-stenotic dilatation (1022: 8). No vascular malformation. Posterior circulation: Dominant left vertebral artery. Diminutive appearance of the right V4 segment after PICA takeoff. Atheromatous disease. No high-grade narrowing, occlusion or aneurysm involving the bilateral PCAs. Small focal outpouching involving the right PCA reflects an infundibulum. No significant stenosis, aneurysm, or vascular malformation. Venous sinuses: No evidence of thrombosis. Anatomic variants: Bilateral PCOM hypoplasia. MRA NECK FINDINGS Image quality is degraded by motion artifact. Three-vessel aortic arch. There is no high-grade narrowing or focal aneurysm involving the bilateral common or right internal carotid artery. Approximately 40% luminal narrowing of the proximal left ICA secondary to atheromatous disease. Diminutive appearance of the right vertebral artery. Proximal to the level of the glottis the right vertebral artery is not visualized, concerning for proximal occlusion. The left vertebral artery is patent and demonstrates antegrade flow. No evidence of high-grade narrowing or focal aneurysm involving left vertebral artery. Dominant left vertebral artery. IMPRESSION: 1. Acute right occipital infarct. 2. Nonvisualization of the proximal right vertebral artery inferior to the level of the glottis, concerning for occlusion. 3. Atheromatous disease with multifocal intracranial stenoses most notably is follows. 4. Short-segment moderate to severe proximal right and distal bilateral intracavernous ICA narrowing. Both of the intracavernous ICAs demonstrate mild short-segment pre stenotic dilatation. 5. Moderate to severe short segment narrowing of the distal left ICA cervical segment. 6. Approximately 40% luminal narrowing of the proximal left ICA, distal to the bifurcation. 7. Please note that motion artifact on MRA head and neck imaging limits evaluation. Electronically Signed: By: Primitivo Gauze M.D. On:  05/02/2020 11:41   ECHOCARDIOGRAM COMPLETE  Result Date: 05/02/2020    ECHOCARDIOGRAM REPORT   Patient Name:   VONN SLIGER Date of Exam: 05/02/2020 Medical Rec #:  [id number]      Height:       68.0 in Accession #:    [id number]     Weight:       228.0 lb Date of Birth:  [birth year]      BSA:          2.161 m Patient Age:    32 years       BP:           163/54 mmHg Patient Gender: M              HR:           66 bpm. Exam Location:  [facility] Procedure: 2D Echo, Cardiac Doppler and Color Doppler Indications:    CVA  History:        Patient has no prior history of Echocardiogram examinations.  Signs/Symptoms:Murmur; Risk Factors:Hypertension, Dyslipidemia                 and Diabetes. Vision loss.  Sonographer:    Dustin Flock RDCS Referring Phys: [place]  1. Left ventricular ejection fraction, by estimation, is 60 to 65%. The left ventricle has normal function. Left ventricular endocardial border not optimally defined to evaluate regional wall motion. The left ventricular internal cavity size was mildly dilated. There is mild left ventricular hypertrophy. Left ventricular diastolic parameters are indeterminate.  2. Right ventricular systolic function is normal. The right ventricular size is normal.  3. The mitral valve is abnormal. Trivial mitral valve regurgitation.  4. AV is thickened, calcified with mildly restricted motion. Peak and mean gradients through the valve are 31 and 18 mm Hg respectively. . The aortic valve is abnormal. Aortic valve regurgitation is not visualized. FINDINGS  Left Ventricle: Left ventricular ejection fraction, by estimation, is 60 to 65%. The left ventricle has normal function. Left ventricular endocardial border not optimally defined to evaluate regional wall motion. The left ventricular internal cavity size was mildly dilated. There is mild left ventricular hypertrophy. Left ventricular diastolic parameters are indeterminate. Right  Ventricle: The right ventricular size is normal. Right vetricular wall thickness was not assessed. Right ventricular systolic function is normal. Left Atrium: Left atrial size was normal in size. Right Atrium: Right atrial size was normal in size. Pericardium: There is no evidence of pericardial effusion. Mitral Valve: The mitral valve is abnormal. There is mild thickening of the mitral valve leaflet(s). Trivial mitral valve regurgitation. Tricuspid Valve: The tricuspid valve is grossly normal. Tricuspid valve regurgitation is not demonstrated. Aortic Valve: AV is thickened, calcified with mildly restricted motion. Peak and mean gradients through the valve are 31 and 18 mm Hg respectively. The aortic valve is abnormal. Aortic valve regurgitation is not visualized. Aortic valve mean gradient measures 17.5 mmHg. Aortic valve peak gradient measures 28.2 mmHg. Aortic valve area, by VTI measures 2.55 cm. Pulmonic Valve: The pulmonic valve was not well visualized. Pulmonic valve regurgitation is not visualized. No evidence of pulmonic stenosis. Aorta: The aortic root is normal in size and structure. IAS/Shunts: The interatrial septum was not assessed.  LEFT VENTRICLE PLAX 2D LVIDd:         5.52 cm  Diastology LVIDs:         4.31 cm  LV e' lateral:   13.40 cm/s LV PW:         1.58 cm  LV E/e' lateral: 7.5 LV IVS:        1.24 cm  LV e' medial:    6.09 cm/s LVOT diam:     3.00 cm  LV E/e' medial:  16.4 LV SV:         175 LV SV Index:   81 LVOT Area:     7.07 cm  RIGHT VENTRICLE RV Basal diam:  3.16 cm RV S prime:     12.70 cm/s TAPSE (M-mode): 2.8 cm LEFT ATRIUM             Index       RIGHT ATRIUM           Index LA diam:        5.20 cm 2.41 cm/m  RA Area:     15.30 cm LA Vol (A2C):   38.6 ml 17.86 ml/m RA Volume:   36.50 ml  16.89 ml/m LA Vol (A4C):   52.3 ml 24.20 ml/m LA Biplane  Vol: 44.7 ml 20.69 ml/m  AORTIC VALVE AV Area (Vmax):    2.63 cm AV Area (Vmean):   2.51 cm AV Area (VTI):     2.55 cm AV Vmax:            265.67 cm/s AV Vmean:          200.500 cm/s AV VTI:            0.686 m AV Peak Grad:      28.2 mmHg AV Mean Grad:      17.5 mmHg LVOT Vmax:         99.00 cm/s LVOT Vmean:        71.300 cm/s LVOT VTI:          0.247 m LVOT/AV VTI ratio: 0.36  AORTA Ao Root diam: 4.00 cm MITRAL VALVE MV Area (PHT): 2.84 cm    SHUNTS MV Decel Time: 267 msec    Systemic VTI:  0.25 m MV E velocity: 99.90 cm/s  Systemic Diam: 3.00 cm MV A velocity: 94.60 cm/s MV E/A ratio:  1.06 Dorris Carnes MD Electronically signed by Dorris Carnes MD Signature Date/Time: 05/02/2020/5:25:05 PM    Final         Scheduled Meds: . sodium chloride   Intravenous Once  . allopurinol  100 mg Oral BID  . aspirin  81 mg Oral Daily  . atorvastatin  20 mg Oral Daily  . clopidogrel  75 mg Oral Daily  . heparin  5,000 Units Subcutaneous Q8H  . insulin aspart  0-5 Units Subcutaneous QHS  . insulin aspart  0-9 Units Subcutaneous TID WC  . polyethylene glycol  8.5 g Oral Daily   Continuous Infusions:    LOS: 2 days    Time spent: 43mins    Kathie Dike, MD [facility]   If 7PM-7AM, please contact night-coverage [email or web address]  05/03/2020, 5:36 PM

## 2020-05-03 NOTE — Progress Notes (Signed)
ANTICOAGULATION CONSULT NOTE - Initial Consult  Pharmacy Consult for Apixaban - Eliquis Indication: atrial fibrillation  No Known Allergies  Patient Measurements: Height: 5\' 8"  (172.7 cm) Weight: 103.4 kg (228 lb) IBW/kg (Calculated) : 68.4  Vital Signs: Temp: 99.1 F (37.3 C) (08/10 1629) Temp Source: Oral (08/10 1629) BP: 163/64 (08/10 1629) Pulse Rate: 85 (08/10 1629)  Labs: Recent Labs    05/01/20 1702 05/01/20 1702 05/02/20 0400 05/03/20 0445  HGB 8.6*   < > 8.0* 7.5*  HCT 26.5*  --  25.2* 24.4*  PLT 211  --  176 172  APTT 29  --   --   --   LABPROT 13.7  --   --   --   INR 1.1  --   --   --   CREATININE 1.79*  --  1.54* 1.34*   < > = values in this interval not displayed.   Estimated Creatinine Clearance: 55.5 mL/min (A) (by C-G formula based on SCr of 1.34 mg/dL (H)).  Medical History: Past Medical History:  Diagnosis Date  . Arthritis   . DM type 2 (diabetes mellitus, type 2) (HCC)    no meds  . Gallstone   . Heart murmur    was told one time he had a murmur, not since 30 years ago  . HTN (hypertension)   . Hyperlipidemia   . Personal history of colonic polyp-adenoma 11/24/2013  . Renal insufficiency    mild  . Thoracic spine fracture (HCC)    Medications:  Scheduled:  . sodium chloride   Intravenous Once  . allopurinol  100 mg Oral BID  . apixaban  5 mg Oral BID  . atorvastatin  20 mg Oral Daily  . clopidogrel  75 mg Oral Daily  . insulin aspart  0-5 Units Subcutaneous QHS  . insulin aspart  0-9 Units Subcutaneous TID WC  . polyethylene glycol  8.5 g Oral Daily    Assessment: This is a 74yo male admitted with CVA with PMH of atrial fibrillation.  His home meds included Aspirin 81mg  daily and Clopidogrel 75mg  daily.  With this encounter, he has been receiving SQ Heparin 5,000 units q8hr.  His labs reveal a decreased HgB of 7.5 from 8.0 yesterday but no acute bleeding noted.  He recently had bilateral iliac stents placed (04/27/2020).  We have been  asked to dose his Apixaban.  Criteria reviewed for dosing and his age is < 14yo, creatinine 1.3 (although it was 1.5 yesterday) and weight > 60kg thus meeting the criteria for dosing of 5mg  bid.  Goal of Therapy:  Stroke prevention   Plan:  - Begin Apixaban 5mg  bid - Monitor H/H closely and any s/s of bleeding - Provide education of this new medication prior to discharge. - Discontinue SQ heparin therapy  Tomasita Morrow 05/03/2020,6:12 PM

## 2020-05-03 NOTE — Procedures (Signed)
Intubation Procedure Note  Connor Bryant  [id number]  12-05-1945  Date:05/03/20  Time:8:45 PM   Provider Performing:Vinnie Bobst, Teena Irani    Procedure: Intubation (15176)  Indication(s) Respiratory Failure  Consent Unable to obtain consent due to emergent nature of procedure.   Anesthesia Etomidate and Succinylcholine   Time Out Verified patient identification, verified procedure, site/side was marked, verified correct patient position, special equipment/implants available, medications/allergies/relevant history reviewed, required imaging and test results available.   Sterile Technique Usual hand hygeine, masks, and gloves were used   Procedure Description Patient positioned in bed supine.  Sedation given as noted above.  Patient was intubated with endotracheal tube using Glidescope.  View was Grade 1 full glottis .  Number of attempts was 1.  Colorimetric CO2 detector was consistent with tracheal placement.   Complications/Tolerance None; patient tolerated the procedure well. Chest X-ray is ordered to verify placement.   EBL Minimal   Specimen(s) None

## 2020-05-03 NOTE — Consult Note (Signed)
Neeses A. Merlene Laughter, MD     [email or web address]          Connor Bryant is an 74 y.o. male.   ASSESSMENT/PLAN: 1. ACUTE LEFT HOMONYMOUS HEMIANOPIA DUE TO ISCHEMIC INFARCT INVOLVING THE CONTRALATERAL OCCIPITAL LOBE. THIS IS LIKELY CARDIOEMBOLIC PHENOMENA IN THE SETTING OF NEW ONSET ATRIAL FIBRILLATION. Long-term anticoagulation is recommended. The patient can be started on an direct oral anticoagulation in today. Given his peripheral vascular disease, antiplatelet agent is recommended. Plavix seems to be the preferred antiplatelet agent. Other risks factor  Reduction includes a continuation of blood pressure and blood sugar control along with the use of the current statin given his well controlled lipid profile. 2.  Peripheral arterial disease status post stent placement recently   The patient is a 73 year old white male who presents with the acute onset of visual of involving the left field. The patient woke up Saturday with his symptoms. He was not considered for tPA given the in precise time of onset. The patient does not report diplopia, dysphagia, dysarthria, hemisensory loss or hemiparesis. He reports have may be having some mild gait and balance problem but he tells me this is baseline. He did report having some dyspnea on exertion today while going to the restroom but no palpitation or chest pain is reported.  The patient underwent stent placement a few days ago with 2 stents placed on the right leg and 1 on the left. Review of systems otherwise negative.   GENERAL:  This is a pleasant overweight male who is in no acute distress at this time.  HEENT:  Neck is supple no trauma noted.  ABDOMEN: soft  EXTREMITIES: No edema   BACK: This is normal.  SKIN: Normal by inspection.    MENTAL STATUS: Alert and oriented. Speech, language and cognition are generally intact. Judgment and insight normal.   CRANIAL NERVES: Pupils are equal, round and reactive to light and  accomodation; extra ocular movements are full, there is no significant nystagmus; visual fields  A dense left homonymous hemianopia ; upper and lower facial muscles are normal in strength and symmetric, there is no flattening of the nasolabial folds; tongue is midline; uvula is midline; shoulder elevation is normal.  MOTOR: Normal tone, bulk and strength; no pronator drift.  COORDINATION: Left finger to nose is normal, right finger to nose is normal, No rest tremor; no intention tremor; no postural tremor; no bradykinesia.  SENSATION: Normal to light touch, temperature, and pain.  There was no extension on double simultaneous stimulation.     NIH stroke scale 2.   Blood pressure (!) 159/60, pulse 74, temperature 99 F (37.2 C), temperature source Oral, resp. rate 18, height 5\' 8"  (1.727 m), weight 103.4 kg, SpO2 91 %.  Past Medical History:  Diagnosis Date  . Arthritis   . DM type 2 (diabetes mellitus, type 2) (HCC)    no meds  . Gallstone   . Heart murmur    was told one time he had a murmur, not since 30 years ago  . HTN (hypertension)   . Hyperlipidemia   . Personal history of colonic polyp-adenoma 11/24/2013  . Renal insufficiency    mild  . Thoracic spine fracture [facility])     Past Surgical History:  Procedure Laterality Date  . ABDOMINAL AORTAGRAM N/A 02/23/2014   Procedure: ABDOMINAL [name];  Surgeon: Conrad Conway, MD;  Location: [facility] CATH LAB;  Service: Cardiovascular;  Laterality: N/A;  . COLONOSCOPY    .  DEBRIDEMENT TENNIS ELBOW Right   . ESOPHAGOGASTRODUODENOSCOPY    . MESENTERIC ARTERY BYPASS N/A 03/10/2014   Procedure: AORTO TO SUPERIOR MESENTERIC ARTERY BYPASS;  Surgeon: [name], MD;  Location: [place];  Service: Vascular;  Laterality: N/A;  . TONSILLECTOMY AND ADENOIDECTOMY    . vascualr surgery     illiac stents    Family History  Problem Relation Age of Onset  . Hypertension Father   . Coronary artery disease Father   . Hyperlipidemia Father   .  Diabetes Father   . Bladder Cancer Father   . Cancer Father   . Heart disease Father   . Heart attack Father   . Diabetes Mother   . Heart disease Mother        before age 56  . Hyperlipidemia Mother   . Hypertension Mother   . AAA (abdominal aortic aneurysm) Mother   . Colon cancer Neg Hx   . Throat cancer Neg Hx   . Kidney disease Neg Hx   . Liver disease Neg Hx     Social History:  reports that he quit smoking about 10 years ago. His smoking use included cigarettes. He has never used smokeless tobacco. He reports current alcohol use of about 12.0 standard drinks of alcohol per week. He reports previous drug use.  Allergies: No Known Allergies  Medications: Prior to Admission medications   Medication Sig Start Date End Date Taking? Authorizing Provider  allopurinol (ZYLOPRIM) 100 MG tablet TAKE 1 TABLET 2 TO 3 TIMES A DAY FOR GOUT 02/29/20  Yes Claretta Fraise, MD  aspirin 81 MG tablet Take 81 mg by mouth daily.   Yes [provider]  atorvastatin (LIPITOR) 20 MG tablet Take 1 tablet (20 mg total) by mouth daily. 02/29/20  Yes Stacks, Cletus Gash, MD  clopidogrel (PLAVIX) 75 MG tablet Take 75 mg by mouth daily. 04/27/20  Yes [provider]  hydrALAZINE (APRESOLINE) 100 MG tablet Take 100 mg by mouth 3 (three) times daily. 03/03/20  Yes [provider]  hydrochlorothiazide (HYDRODIURIL) 25 MG tablet TAKE 1 OR 2 TABLETS DAILY IN THE MORNING AS NEEDED 02/29/20  Yes Stacks, Cletus Gash, MD  hydrochlorothiazide (HYDRODIURIL) 25 MG tablet Take 12.5 mg by mouth daily. 02/29/20  Yes [provider]  loratadine (CLARITIN) 10 MG tablet Take 1 tablet (10 mg total) by mouth daily. 08/27/19  Yes Terald Sleeper, PA-C  metFORMIN (GLUCOPHAGE-XR) 500 MG 24 hr tablet 2 tablet with evening meal Once a day 02/29/20  Yes Stacks, Cletus Gash, MD  metoprolol (TOPROL-XL) 200 MG 24 hr tablet Take 1 tablet (200 mg total) by mouth daily. 02/29/20  Yes Stacks, Cletus Gash, MD  pioglitazone (ACTOS) 30 MG  tablet Take 1 tablet (30 mg total) by mouth daily. 02/29/20  Yes Stacks, Cletus Gash, MD  polyethylene glycol (MIRALAX / GLYCOLAX) packet Take 17 g by mouth daily. Patient taking differently: Take 8.5 g by mouth daily.  10/02/18  Yes Gherghe, Vella Redhead, MD  trandolapril (MAVIK) 4 MG tablet Take 1 tablet (4 mg total) by mouth daily. 02/29/20  Yes Claretta Fraise, MD  verapamil (CALAN-SR) 240 MG CR tablet Take 2 tablets (480 mg total) by mouth daily. 02/29/20  Yes Claretta Fraise, MD  hydrALAZINE (APRESOLINE) 50 MG tablet Take 1 tablet (50 mg total) by mouth 3 (three) times daily. 02/29/20   Claretta Fraise, MD    Scheduled Meds: . allopurinol  100 mg Oral BID  . aspirin  81 mg Oral Daily  . atorvastatin  20 mg Oral Daily  . clopidogrel  75 mg Oral Daily  . heparin  5,000 Units Subcutaneous Q8H  . insulin aspart  0-5 Units Subcutaneous QHS  . insulin aspart  0-9 Units Subcutaneous TID WC  . polyethylene glycol  8.5 g Oral Daily   Continuous Infusions: PRN Meds:.acetaminophen **OR** acetaminophen, hydrALAZINE     Results for orders placed or performed during the hospital encounter of 05/01/20 (from the past 48 hour(s))  Ethanol     Status: None   Collection Time: 05/01/20  5:02 PM  Result Value Ref Range   Alcohol, Ethyl (B) <10 <10 mg/dL    Comment: (NOTE) Lowest detectable limit for serum alcohol is 10 mg/dL.  For medical purposes only. Performed at [facility], [address] [place], [place] [address]   Protime-INR     Status: None   Collection Time: 05/01/20  5:02 PM  Result Value Ref Range   Prothrombin Time 13.7 11.4 - 15.2 seconds   INR 1.1 0.8 - 1.2    Comment: (NOTE) INR goal varies based on device and disease states. Performed at [facility], [address], [place], [place] [address]   APTT     Status: None   Collection Time: 05/01/20  5:02 PM  Result Value Ref Range   aPTT 29 24 - 36 seconds    Comment: Performed at [facility], [address] [place], [place] [address]  CBC      Status: Abnormal   Collection Time: 05/01/20  5:02 PM  Result Value Ref Range   WBC 7.8 4.0 - 10.5 K/uL   RBC 2.75 (L) 4.22 - 5.81 MIL/uL   Hemoglobin 8.6 (L) 13.0 - 17.0 g/dL   HCT 26.5 (L) 39 - 52 %   MCV 96.4 80.0 - 100.0 fL   MCH 31.3 26.0 - 34.0 pg   MCHC 32.5 30.0 - 36.0 g/dL   RDW 15.7 (H) 11.5 - 15.5 %   Platelets 211 150 - 400 K/uL   nRBC 0.0 0.0 - 0.2 %    Comment: Performed at [facility], [address] [place], [place] [address]  Differential     Status: None   Collection Time: 05/01/20  5:02 PM  Result Value Ref Range   Neutrophils Relative % 75 %   Neutro Abs 5.9 1.7 - 7.7 K/uL   Lymphocytes Relative 13 %   Lymphs Abs 1.0 0.7 - 4.0 K/uL   Monocytes Relative 9 %   Monocytes Absolute 0.7 0 - 1 K/uL   Eosinophils Relative 2 %   Eosinophils Absolute 0.1 0 - 0 K/uL   Basophils Relative 0 %   Basophils Absolute 0.0 0 - 0 K/uL   Immature Granulocytes 1 %   Abs Immature Granulocytes 0.04 0.00 - 0.07 K/uL    Comment: Performed at [facility], [address] [place], [place] [address]  Comprehensive metabolic panel     Status: Abnormal   Collection Time: 05/01/20  5:02 PM  Result Value Ref Range   Sodium 136 135 - 145 mmol/L   Potassium 4.0 3.5 - 5.1 mmol/L   Chloride 104 98 - 111 mmol/L   CO2 18 (L) 22 - 32 mmol/L   Glucose, Bld 174 (H) 70 - 99 mg/dL    Comment: Glucose reference range applies only to samples taken after fasting for at least 8 hours.   BUN 40 (H) 8 - 23 mg/dL   Creatinine, Ser 1.79 (H) 0.61 - 1.24 mg/dL  Calcium 9.1 8.9 - 10.3 mg/dL   Total Protein 7.2 6.5 - 8.1 g/dL   Albumin 3.7 3.5 - 5.0 g/dL   AST 23 15 - 41 U/L   ALT 20 0 - 44 U/L   Alkaline Phosphatase 60 38 - 126 U/L   Total Bilirubin 1.0 0.3 - 1.2 mg/dL   GFR calc non Af Amer 36 (L) >60 mL/min   GFR calc Af Amer 42 (L) >60 mL/min   Anion gap 14 5 - 15    Comment: Performed at [facility], [address], [place], [place] [address]  SARS Coronavirus 2 by RT PCR (hospital order,  performed in [facility] hospital lab) Nasopharyngeal Nasopharyngeal Swab     Status: None   Collection Time: 05/01/20  7:43 PM   Specimen: Nasopharyngeal Swab  Result Value Ref Range   SARS Coronavirus 2 NEGATIVE NEGATIVE    Comment: (NOTE) SARS-CoV-2 target nucleic acids are NOT DETECTED.  The SARS-CoV-2 RNA is generally detectable in upper and lower respiratory specimens during the acute phase of infection. The lowest concentration of SARS-CoV-2 viral copies this assay can detect is 250 copies / mL. A negative result does not preclude SARS-CoV-2 infection and should not be used as the sole basis for treatment or other patient management decisions.  A negative result may occur with improper specimen collection / handling, submission of specimen other than nasopharyngeal swab, presence of viral mutation(s) within the areas targeted by this assay, and inadequate number of viral copies (<250 copies / mL). A negative result must be combined with clinical observations, patient history, and epidemiological information.  Fact Sheet for Patients:   StrictlyIdeas.no  Fact Sheet for Healthcare Providers: BankingDealers.co.za  This test is not yet approved or  cleared by the [place] FDA and has been authorized for detection and/or diagnosis of SARS-CoV-2 by FDA under an Emergency Use Authorization (EUA).  This EUA will remain in effect (meaning this test can be used) for the duration of the COVID-19 declaration under Section 564(b)(1) of the Act, 21 U.S.C. section 360bbb-3(b)(1), unless the authorization is terminated or revoked sooner.  Performed at [facility], [address] [place], [place] [address]   TSH     Status: Abnormal   Collection Time: 05/01/20  8:21 PM  Result Value Ref Range   TSH 5.072 (H) 0.350 - 4.500 uIU/mL    Comment: Performed by a 3rd Generation assay with a functional sensitivity of <=0.01 uIU/mL. Performed  at [facility], [address], [place], [place] [address]   CBG monitoring, ED     Status: Abnormal   Collection Time: 05/01/20  9:36 PM  Result Value Ref Range   Glucose-Capillary 108 (H) 70 - 99 mg/dL    Comment: Glucose reference range applies only to samples taken after fasting for at least 8 hours.  Urine rapid drug screen (hosp performed)     Status: None   Collection Time: 05/02/20  3:10 AM  Result Value Ref Range   Opiates NONE DETECTED NONE DETECTED   Cocaine NONE DETECTED NONE DETECTED   Benzodiazepines NONE DETECTED NONE DETECTED   Amphetamines NONE DETECTED NONE DETECTED   Tetrahydrocannabinol NONE DETECTED NONE DETECTED   Barbiturates NONE DETECTED NONE DETECTED    Comment: (NOTE) DRUG SCREEN FOR MEDICAL PURPOSES ONLY.  IF CONFIRMATION IS NEEDED FOR ANY PURPOSE, NOTIFY LAB WITHIN 5 DAYS.  LOWEST DETECTABLE LIMITS FOR URINE DRUG SCREEN Drug Class  Cutoff (ng/mL) Amphetamine and metabolites    1000 Barbiturate and metabolites    200 Benzodiazepine                 295 Tricyclics and metabolites     300 Opiates and metabolites        300 Cocaine and metabolites        300 THC                            50 Performed at [facility], [address], [place], [place] [address]   Basic metabolic panel     Status: Abnormal   Collection Time: 05/02/20  4:00 AM  Result Value Ref Range   Sodium 138 135 - 145 mmol/L   Potassium 3.6 3.5 - 5.1 mmol/L   Chloride 106 98 - 111 mmol/L   CO2 21 (L) 22 - 32 mmol/L   Glucose, Bld 122 (H) 70 - 99 mg/dL    Comment: Glucose reference range applies only to samples taken after fasting for at least 8 hours.   BUN 36 (H) 8 - 23 mg/dL   Creatinine, Ser 1.54 (H) 0.61 - 1.24 mg/dL   Calcium 8.8 (L) 8.9 - 10.3 mg/dL   GFR calc non Af Amer 43 (L) >60 mL/min   GFR calc Af Amer 50 (L) >60 mL/min   Anion gap 11 5 - 15    Comment: Performed at [facility], [address], [place], [place] [address]  CBC     Status:  Abnormal   Collection Time: 05/02/20  4:00 AM  Result Value Ref Range   WBC 5.2 4.0 - 10.5 K/uL   RBC 2.58 (L) 4.22 - 5.81 MIL/uL   Hemoglobin 8.0 (L) 13.0 - 17.0 g/dL   HCT 25.2 (L) 39 - 52 %   MCV 97.7 80.0 - 100.0 fL   MCH 31.0 26.0 - 34.0 pg   MCHC 31.7 30.0 - 36.0 g/dL   RDW 15.8 (H) 11.5 - 15.5 %   Platelets 176 150 - 400 K/uL   nRBC 0.0 0.0 - 0.2 %    Comment: Performed at [facility], [address], [place], [place] [address]  Lipid panel     Status: None   Collection Time: 05/02/20  4:00 AM  Result Value Ref Range   Cholesterol 126 0 - 200 mg/dL   Triglycerides 68 <150 mg/dL   HDL 58 >40 mg/dL   Total CHOL/HDL Ratio 2.2 RATIO   VLDL 14 0 - 40 mg/dL   LDL Cholesterol 54 0 - 99 mg/dL    Comment:        Total Cholesterol/HDL:CHD Risk Coronary Heart Disease Risk Table                     Men   Women  1/2 Average Risk   3.4   3.3  Average Risk       5.0   4.4  2 X Average Risk   9.6   7.1  3 X Average Risk  23.4   11.0        Use the calculated Patient Ratio above and the CHD Risk Table to determine the patient's CHD Risk.        ATP III CLASSIFICATION (LDL):  <100     mg/dL   Optimal  100-129  mg/dL   Near or Above  Optimal  130-159  mg/dL   Borderline  160-189  mg/dL   High  >190     mg/dL   Very High Performed at [facility], [address] [place], [place] [address]   Ferritin     Status: None   Collection Time: 05/02/20  4:00 AM  Result Value Ref Range   Ferritin 83 24 - 336 ng/mL    Comment: Performed at [facility], [address] [place], [place] [address]  Iron and TIBC     Status: Abnormal   Collection Time: 05/02/20  4:00 AM  Result Value Ref Range   Iron 38 (L) 45 - 182 ug/dL   TIBC 313 250 - 450 ug/dL   Saturation Ratios 12 (L) 17.9 - 39.5 %   UIBC 275 ug/dL    Comment: Performed at [facility], [address], [place], [place] [address]  Hemoglobin A1c     Status: Abnormal   Collection Time: 05/02/20  4:00 AM  Result  Value Ref Range   Hgb A1c MFr Bld 6.2 (H) 4.8 - 5.6 %    Comment: (NOTE) Pre diabetes:          5.7%-6.4%  Diabetes:              >6.4%  Glycemic control for   <7.0% adults with diabetes    Mean Plasma Glucose 131.24 mg/dL    Comment: Performed at [facility] [address] [place], [place] [address]  CBG monitoring, ED     Status: Abnormal   Collection Time: 05/02/20  8:10 AM  Result Value Ref Range   Glucose-Capillary 112 (H) 70 - 99 mg/dL    Comment: Glucose reference range applies only to samples taken after fasting for at least 8 hours.  CBG monitoring, ED     Status: Abnormal   Collection Time: 05/02/20 11:55 AM  Result Value Ref Range   Glucose-Capillary 114 (H) 70 - 99 mg/dL    Comment: Glucose reference range applies only to samples taken after fasting for at least 8 hours.  Glucose, capillary     Status: Abnormal   Collection Time: 05/02/20  4:43 PM  Result Value Ref Range   Glucose-Capillary 137 (H) 70 - 99 mg/dL    Comment: Glucose reference range applies only to samples taken after fasting for at least 8 hours.   Comment 1 Document in Chart   Glucose, capillary     Status: Abnormal   Collection Time: 05/02/20  9:53 PM  Result Value Ref Range   Glucose-Capillary 125 (H) 70 - 99 mg/dL    Comment: Glucose reference range applies only to samples taken after fasting for at least 8 hours.  CBC     Status: Abnormal   Collection Time: 05/03/20  4:45 AM  Result Value Ref Range   WBC 4.8 4.0 - 10.5 K/uL   RBC 2.46 (L) 4.22 - 5.81 MIL/uL   Hemoglobin 7.5 (L) 13.0 - 17.0 g/dL   HCT 24.4 (L) 39 - 52 %   MCV 99.2 80.0 - 100.0 fL   MCH 30.5 26.0 - 34.0 pg   MCHC 30.7 30.0 - 36.0 g/dL   RDW 15.5 11.5 - 15.5 %   Platelets 172 150 - 400 K/uL   nRBC 0.0 0.0 - 0.2 %    Comment: Performed at [facility], [address], [place],  [address]  Renal function panel     Status: Abnormal   Collection Time: 05/03/20  4:45  AM  Result Value Ref Range   Sodium 140 135  - 145 mmol/L   Potassium 3.9 3.5 - 5.1 mmol/L   Chloride 109 98 - 111 mmol/L   CO2 22 22 - 32 mmol/L   Glucose, Bld 121 (H) 70 - 99 mg/dL    Comment: Glucose reference range applies only to samples taken after fasting for at least 8 hours.   BUN 27 (H) 8 - 23 mg/dL   Creatinine, Ser 1.34 (H) 0.61 - 1.24 mg/dL   Calcium 8.5 (L) 8.9 - 10.3 mg/dL   Phosphorus 3.7 2.5 - 4.6 mg/dL   Albumin 3.1 (L) 3.5 - 5.0 g/dL   GFR calc non Af Amer 51 (L) >60 mL/min   GFR calc Af Amer 60 (L) >60 mL/min   Anion gap 9 5 - 15    Comment: Performed at [facility], [address], [place], [place] [address]    Studies/Results:  BRAIN MRI MRA FINDINGS: MR HEAD FINDINGS  Brain: Acute right occipital infarct. No intracranial hemorrhage. No midline shift, ventriculomegaly or extra-axial fluid collection. No mass lesion.  Vascular: Please see MRA head.  Skull and upper cervical spine: Normal marrow signal.  Sinuses/Orbits: Normal orbits. Clear paranasal sinuses. Trace bilateral mastoid effusions.  Other: None.  MR CIRCLE OF WILLIS FINDINGS  Image quality is degraded by motion artifact.  Anterior circulation: Left A1 segment hypoplasia. Mild left A1 segment irregularity likely reflects atheromatous disease. Mild right and moderate to severe narrowing of the distal left ICA cervical segments. Vessel wall irregularity involving the petrous and intracavernous ICAs likely reflects atheromatous disease versus motion artifact. Short segment moderate to severe narrowing involving the proximal right and distal bilateral intra cavernous ICAs (1022:9). The bilateral intracavernous ICAs demonstrates short segment, mild pre-stenotic dilatation (1022: 8). No vascular malformation.  Posterior circulation: Dominant left vertebral artery. Diminutive appearance of the right V4 segment after PICA takeoff. Atheromatous disease. No high-grade narrowing, occlusion or aneurysm involving the bilateral  PCAs. Small focal outpouching involving the right PCA reflects an infundibulum. No significant stenosis, aneurysm, or vascular malformation.  Venous sinuses: No evidence of thrombosis.  Anatomic variants: Bilateral PCOM hypoplasia.  MRA NECK FINDINGS  Image quality is degraded by motion artifact.  Three-vessel aortic arch. There is no high-grade narrowing or focal aneurysm involving the bilateral common or right internal carotid artery. Approximately 40% luminal narrowing of the proximal left ICA secondary to atheromatous disease.  Diminutive appearance of the right vertebral artery. Proximal to the level of the glottis the right vertebral artery is not visualized, concerning for proximal occlusion.  The left vertebral artery is patent and demonstrates antegrade flow. No evidence of high-grade narrowing or focal aneurysm involving left vertebral artery. Dominant left vertebral artery.  IMPRESSION: 1. Acute right occipital infarct. 2. Nonvisualization of the proximal right vertebral artery inferior to the level of the glottis, concerning for occlusion. 3. Atheromatous disease with multifocal intracranial stenoses most notably is follows. 4. Short-segment moderate to severe proximal right and distal bilateral intracavernous ICA narrowing. Both of the intracavernous ICAs demonstrate mild short-segment pre stenotic dilatation. 5. Moderate to severe short segment narrowing of the distal left ICA cervical segment. 6. Approximately 40% luminal narrowing of the proximal left ICA, distal to the bifurcation. 7. Please note that motion artifact on MRA head and neck imaging limits evaluation.   The brain MRI and MRA reviewed in person.  There is acute infarct involving the R occipital lobe and extending to the posterior splenium of the corpus  callosum.  No hemorrhages noted.  There is mild to moderate chronic ischemic white matter changes.   Keric Zehren A. Merlene Laughter, M.D.    Diplomate, Tax adviser of Psychiatry and Neurology ( Neurology). 05/03/2020, 7:04 AM

## 2020-05-04 ENCOUNTER — Inpatient Hospital Stay (HOSPITAL_COMMUNITY): Payer: Medicare Other

## 2020-05-04 DIAGNOSIS — R7989 Other specified abnormal findings of blood chemistry: Secondary | ICD-10-CM | POA: Diagnosis not present

## 2020-05-04 DIAGNOSIS — J9601 Acute respiratory failure with hypoxia: Secondary | ICD-10-CM | POA: Diagnosis not present

## 2020-05-04 DIAGNOSIS — N183 Chronic kidney disease, stage 3 unspecified: Secondary | ICD-10-CM

## 2020-05-04 DIAGNOSIS — R509 Fever, unspecified: Secondary | ICD-10-CM

## 2020-05-04 DIAGNOSIS — I639 Cerebral infarction, unspecified: Secondary | ICD-10-CM | POA: Diagnosis not present

## 2020-05-04 DIAGNOSIS — Z978 Presence of other specified devices: Secondary | ICD-10-CM | POA: Diagnosis not present

## 2020-05-04 DIAGNOSIS — D649 Anemia, unspecified: Secondary | ICD-10-CM

## 2020-05-04 DIAGNOSIS — I6389 Other cerebral infarction: Secondary | ICD-10-CM

## 2020-05-04 HISTORY — DX: Acute respiratory failure with hypoxia: J96.01

## 2020-05-04 LAB — URINALYSIS, MICROSCOPIC (REFLEX): RBC / HPF: >50 RBC/hpf (ref 0–5)

## 2020-05-04 LAB — HEPARIN LEVEL (UNFRACTIONATED)
Heparin Unfractionated: <0.1 IU/mL — ABNORMAL LOW (ref 0.30–0.70)
Heparin Unfractionated: 1.28 IU/mL — ABNORMAL HIGH (ref 0.30–0.70)

## 2020-05-04 LAB — TYPE AND SCREEN
ABO/RH(D): O POS
Antibody Screen: NEGATIVE
Unit division: 0

## 2020-05-04 LAB — TROPONIN I (HIGH SENSITIVITY)
Troponin I (High Sensitivity): 1128 ng/L (ref <18)
Troponin I (High Sensitivity): 2324 ng/L (ref <18)
Troponin I (High Sensitivity): 2506 ng/L (ref <18)
Troponin I (High Sensitivity): 2499 ng/L (ref <18)

## 2020-05-04 LAB — BLOOD GAS, ARTERIAL
Acid-base deficit: 3.1 mmol/L — ABNORMAL HIGH (ref 0.0–2.0)
Bicarbonate: 22 mmol/L (ref 20.0–28.0)
FIO2: 80
O2 Saturation: 95.5 %
Patient temperature: 37.6
pCO2 arterial: 37.3 mmHg (ref 32.0–48.0)
pH, Arterial: 7.375 (ref 7.350–7.450)
pO2, Arterial: 99 mmHg (ref 83.0–108.0)

## 2020-05-04 LAB — BASIC METABOLIC PANEL
Anion gap: 14 (ref 5–15)
BUN: 26 mg/dL — ABNORMAL HIGH (ref 8–23)
CO2: 19 mmol/L — ABNORMAL LOW (ref 22–32)
Calcium: 8.5 mg/dL — ABNORMAL LOW (ref 8.9–10.3)
Chloride: 108 mmol/L (ref 98–111)
Creatinine, Ser: 1.56 mg/dL — ABNORMAL HIGH (ref 0.61–1.24)
GFR calc Af Amer: 50 mL/min — ABNORMAL LOW (ref >60)
GFR calc non Af Amer: 43 mL/min — ABNORMAL LOW (ref >60)
Glucose, Bld: 134 mg/dL — ABNORMAL HIGH (ref 70–99)
Potassium: 4.2 mmol/L (ref 3.5–5.1)
Sodium: 141 mmol/L (ref 135–145)

## 2020-05-04 LAB — ECHOCARDIOGRAM LIMITED
AV Mean grad: 12 mmHg
AV Peak grad: 22.8 mmHg
Ao pk vel: 2.39 m/s
Area-P 1/2: 7.37 cm2
Height: 68 in
S' Lateral: 4.5 cm
Weight: 3648 oz

## 2020-05-04 LAB — CBC
HCT: 31.8 % — ABNORMAL LOW (ref 39.0–52.0)
Hemoglobin: 9.2 g/dL — ABNORMAL LOW (ref 13.0–17.0)
MCH: 30 pg (ref 26.0–34.0)
MCHC: 28.9 g/dL — ABNORMAL LOW (ref 30.0–36.0)
MCV: 103.6 fL — ABNORMAL HIGH (ref 80.0–100.0)
Platelets: 177 10*3/uL (ref 150–400)
RBC: 3.07 MIL/uL — ABNORMAL LOW (ref 4.22–5.81)
RDW: 16.4 % — ABNORMAL HIGH (ref 11.5–15.5)
WBC: 9.2 10*3/uL (ref 4.0–10.5)
nRBC: 0.3 % — ABNORMAL HIGH (ref 0.0–0.2)

## 2020-05-04 LAB — URINALYSIS, ROUTINE W REFLEX MICROSCOPIC

## 2020-05-04 LAB — GLUCOSE, CAPILLARY
Glucose-Capillary: 124 mg/dL — ABNORMAL HIGH (ref 70–99)
Glucose-Capillary: 129 mg/dL — ABNORMAL HIGH (ref 70–99)
Glucose-Capillary: 132 mg/dL — ABNORMAL HIGH (ref 70–99)
Glucose-Capillary: 133 mg/dL — ABNORMAL HIGH (ref 70–99)
Glucose-Capillary: 134 mg/dL — ABNORMAL HIGH (ref 70–99)
Glucose-Capillary: 158 mg/dL — ABNORMAL HIGH (ref 70–99)

## 2020-05-04 LAB — BPAM RBC
Blood Product Expiration Date: 202109012359
ISSUE DATE / TIME: 202108101545
Unit Type and Rh: 9500

## 2020-05-04 LAB — HEMOGLOBIN AND HEMATOCRIT, BLOOD
HCT: 31.4 % — ABNORMAL LOW (ref 39.0–52.0)
Hemoglobin: 9.7 g/dL — ABNORMAL LOW (ref 13.0–17.0)

## 2020-05-04 LAB — MRSA PCR SCREENING: MRSA by PCR: NEGATIVE

## 2020-05-04 LAB — APTT: aPTT: 78 seconds — ABNORMAL HIGH (ref 24–36)

## 2020-05-04 MED ORDER — ASPIRIN EC 81 MG PO TBEC
81.0000 mg | DELAYED_RELEASE_TABLET | Freq: Every day | ORAL | Status: DC
  Administered 2020-05-05 – 2020-05-16 (×12): 81 mg via ORAL
  Filled 2020-05-04 (×12): qty 1

## 2020-05-04 MED ORDER — PANTOPRAZOLE SODIUM 40 MG IV SOLR
40.0000 mg | INTRAVENOUS | Status: DC
  Administered 2020-05-04 – 2020-05-09 (×6): 40 mg via INTRAVENOUS
  Filled 2020-05-04 (×6): qty 40

## 2020-05-04 MED ORDER — ORAL CARE MOUTH RINSE
15.0000 mL | OROMUCOSAL | Status: DC
  Administered 2020-05-04 – 2020-05-06 (×28): 15 mL via OROMUCOSAL

## 2020-05-04 MED ORDER — MIDAZOLAM 50MG/50ML (1MG/ML) PREMIX INFUSION
0.5000 mg/h | INTRAVENOUS | Status: DC
  Administered 2020-05-04: 2 mg/h via INTRAVENOUS
  Administered 2020-05-04: 3.5 mg/h via INTRAVENOUS
  Administered 2020-05-05: 4.5 mg/h via INTRAVENOUS
  Administered 2020-05-06: 2 mg/h via INTRAVENOUS
  Administered 2020-05-06: 4.5 mg/h via INTRAVENOUS
  Filled 2020-05-04 (×5): qty 50

## 2020-05-04 MED ORDER — DILTIAZEM HCL-DEXTROSE 125-5 MG/125ML-% IV SOLN (PREMIX)
5.0000 mg/h | INTRAVENOUS | Status: DC
  Administered 2020-05-04: 5 mg/h via INTRAVENOUS
  Filled 2020-05-04: qty 125

## 2020-05-04 MED ORDER — SODIUM CHLORIDE 0.9 % IV SOLN
2.0000 g | Freq: Two times a day (BID) | INTRAVENOUS | Status: DC
  Administered 2020-05-04 – 2020-05-10 (×13): 2 g via INTRAVENOUS
  Filled 2020-05-04 (×12): qty 2

## 2020-05-04 MED ORDER — PERFLUTREN LIPID MICROSPHERE
1.0000 mL | INTRAVENOUS | Status: AC | PRN
  Administered 2020-05-04: 1 mL via INTRAVENOUS
  Filled 2020-05-04: qty 10

## 2020-05-04 MED ORDER — IPRATROPIUM-ALBUTEROL 0.5-2.5 (3) MG/3ML IN SOLN
3.0000 mL | Freq: Four times a day (QID) | RESPIRATORY_TRACT | Status: DC
  Administered 2020-05-04 – 2020-05-09 (×21): 3 mL via RESPIRATORY_TRACT
  Filled 2020-05-04 (×21): qty 3

## 2020-05-04 MED ORDER — FUROSEMIDE 10 MG/ML IJ SOLN
40.0000 mg | Freq: Once | INTRAMUSCULAR | Status: AC
  Administered 2020-05-04: 40 mg via INTRAVENOUS
  Filled 2020-05-04: qty 4

## 2020-05-04 MED ORDER — METOPROLOL TARTRATE 5 MG/5ML IV SOLN
5.0000 mg | Freq: Four times a day (QID) | INTRAVENOUS | Status: DC | PRN
  Administered 2020-05-04 – 2020-05-09 (×3): 5 mg via INTRAVENOUS
  Filled 2020-05-04 (×3): qty 5

## 2020-05-04 MED ORDER — CHLORHEXIDINE GLUCONATE 0.12% ORAL RINSE (MEDLINE KIT)
15.0000 mL | Freq: Two times a day (BID) | OROMUCOSAL | Status: DC
  Administered 2020-05-04 – 2020-05-16 (×14): 15 mL via OROMUCOSAL

## 2020-05-04 NOTE — Progress Notes (Signed)
CRITICAL VALUE ALERT  Critical Value:  Troponin 2499  Date & Time Notied:  05/04/20, 2100  Provider Notified: Sharlet Salina  Orders Received/Actions taken:

## 2020-05-04 NOTE — Progress Notes (Signed)
Secure chat Dr. Manuella Ghazi with increase of troponin to 2506. Amion would not load. Sputum from ETT tube becoming more pink color. Urine still red. Heart rate is staying around 130 after metoprolol

## 2020-05-04 NOTE — Progress Notes (Signed)
Initial Nutrition Assessment  DOCUMENTATION CODES:   Obesity unspecified  INTERVENTION:  If pt remains intubated- recommend consider starting Vital High Protein @ 40 ml/hr via OGT and increase by 10 ml every 6 hours to goal rate of 60 ml/hr.   45 ml Prosource TF- BID per OGT    Tube feeding regimen provides 1620 kcal, 156 grams of protein, and 1204 ml of H2O.     NUTRITION DIAGNOSIS:   Inadequate oral intake related to inability to eat as evidenced by NPO status.  GOAL:  Provide needs based on ASPEN/SCCM guidelines   MONITOR:   Vent status, TF tolerance, Labs, I & O's, Skin REASON FOR ASSESSMENT:  Ventilator    ASSESSMENT: Patient is currently intubated on ventilator support 8/10 worsening dyspnea. CT head findings- acute right medial occipital lobe infarct. Presents with left eye vision problem. New onset atrial fibrillation.   History of gout, hypertension DM, CKD-3a and s/p insertion of iliac artery stent (04/27/20).  MV: 10.5  L/min Temp (24hrs), Avg:99.8 F (37.7 C), Min:98.6 F (37 C), Max:101.7 F (38.7 C) MAP-74 Propofol: 0 ml/hr    Medications reviewed and include: allopurinol, lipitor, novolog, midazolam   Intake/Output Summary (Last 24 hours) at 05/04/2020 1434 Last data filed at 05/04/2020 1100 Gross per 24 hour  Intake 973.66 ml  Output 900 ml  Net 73.66 ml   Weight has ranged 99-106 kg the past 2+ years. No recent acute changes based on chart review.   Labs: BNP-1082, lactic acid 2.9 BMP Latest Ref Rng & Units 05/04/2020 05/03/2020 05/03/2020  Glucose 70 - 99 mg/dL 134(H) 283(H) 121(H)  BUN 8 - 23 mg/dL 26(H) 25(H) 27(H)  Creatinine 0.61 - 1.24 mg/dL 1.56(H) 1.59(H) 1.34(H)  BUN/Creat Ratio 10 - 24 - - -  Sodium 135 - 145 mmol/L 141 139 140  Potassium 3.5 - 5.1 mmol/L 4.2 4.0 3.9  Chloride 98 - 111 mmol/L 108 107 109  CO2 22 - 32 mmol/L 19(L) 19(L) 22  Calcium 8.9 - 10.3 mg/dL 8.5(L) 8.4(L) 8.5(L)     NUTRITION - FOCUSED PHYSICAL  EXAM: Nutrition-Focused physical exam completed. Findings are no fat depletion, no muscle depletion, and no edema.     Diet Order:   Diet Order            Diet NPO time specified  Diet effective now                 EDUCATION NEEDS:  Not appropriate for education at this time   Skin:  Skin Assessment: Reviewed RN Assessment  Last BM:  8/10  Height:   Ht Readings from Last 1 Encounters:  05/03/20 5\' 8"  (1.727 m)    Weight:   Wt Readings from Last 1 Encounters:  05/01/20 103.4 kg    Ideal Body Weight:   70 kg  BMI:  Body mass index is 34.67 kg/m.  Estimated Nutritional Needs:   Kcal:  2090  Protein:  147-154 gr  Fluid:  >1700 ml daily  Colman Cater MS,RD,CSG,LDN Pager: Shea Evans

## 2020-05-04 NOTE — Progress Notes (Signed)
ANTICOAGULATION CONSULT NOTE -  Pharmacy Consult for apixaban to heparin  Indication: atrial fibrillation  No Known Allergies  Patient Measurements: Height: 5\' 8"  (172.7 cm) Weight: 103.4 kg (228 lb) IBW/kg (Calculated) : 68.4 Heparin Dosing Weight: 91 kg  Vital Signs: Temp: 101.3 F (38.5 C) (08/11 2045) BP: 124/59 (08/11 2045) Pulse Rate: 103 (08/11 2045)  Labs: Recent Labs    05/03/20 0445 05/03/20 0445 05/03/20 2133 05/03/20 2333 05/04/20 0407 05/04/20 0442 05/04/20 1622 05/04/20 1948  HGB 7.5*   < > 10.7*   < > 9.2*  --   --  9.7*  HCT 24.4*   < > 34.6*  --  31.8*  --   --  31.4*  PLT 172  --  266  --  177  --   --   --   APTT  --   --  27  --   --   --  78*  --   LABPROT  --   --  13.2  --   --   --   --   --   INR  --   --  1.0  --   --   --   --   --   HEPARINUNFRC  --   --  <0.10*  --   --   --  1.28*  --   CREATININE 1.34*  --  1.59*  --  1.56*  --   --   --   TROPONINIHS  --   --  452*   < >  --  2,324* 2,506* 2,499*   < > = values in this interval not displayed.    Estimated Creatinine Clearance: 47.7 mL/min (A) (by C-G formula based on SCr of 1.56 mg/dL (H)).   Medical History: Past Medical History:  Diagnosis Date  . Arthritis   . DM type 2 (diabetes mellitus, type 2) (HCC)    no meds  . Gallstone   . Heart murmur    was told one time he had a murmur, not since 30 years ago  . HTN (hypertension)   . Hyperlipidemia   . Personal history of colonic polyp-adenoma 11/24/2013  . Renal insufficiency    mild  . Thoracic spine fracture (HCC)     Medications:  Medications Prior to Admission  Medication Sig Dispense Refill Last Dose  . allopurinol (ZYLOPRIM) 100 MG tablet TAKE 1 TABLET 2 TO 3 TIMES A DAY FOR GOUT 90 tablet 3 05/01/2020  . aspirin 81 MG tablet Take 81 mg by mouth daily.   05/01/2020 at 0700  . atorvastatin (LIPITOR) 20 MG tablet Take 1 tablet (20 mg total) by mouth daily. 90 tablet 3 05/01/2020  . clopidogrel (PLAVIX) 75 MG tablet Take 75  mg by mouth daily.   05/01/2020 at 0700  . hydrALAZINE (APRESOLINE) 100 MG tablet Take 100 mg by mouth 3 (three) times daily.   05/01/2020  . hydrochlorothiazide (HYDRODIURIL) 25 MG tablet TAKE 1 OR 2 TABLETS DAILY IN THE MORNING AS NEEDED 180 tablet 3 05/01/2020  . hydrochlorothiazide (HYDRODIURIL) 25 MG tablet Take 12.5 mg by mouth daily.   05/01/2020  . loratadine (CLARITIN) 10 MG tablet Take 1 tablet (10 mg total) by mouth daily. 90 tablet 3 Past Week at Unknown time  . metFORMIN (GLUCOPHAGE-XR) 500 MG 24 hr tablet 2 tablet with evening meal Once a day 180 tablet 3 04/30/2020 at Unknown time  . metoprolol (TOPROL-XL) 200 MG 24 hr tablet Take 1 tablet (200  mg total) by mouth daily. 90 tablet 3 05/01/2020 at 0700  . pioglitazone (ACTOS) 30 MG tablet Take 1 tablet (30 mg total) by mouth daily. 90 tablet 3 05/01/2020  . polyethylene glycol (MIRALAX / GLYCOLAX) packet Take 17 g by mouth daily. (Patient taking differently: Take 8.5 g by mouth daily. ) 30 packet 1 05/01/2020  . trandolapril (MAVIK) 4 MG tablet Take 1 tablet (4 mg total) by mouth daily. 90 tablet 3 05/01/2020  . verapamil (CALAN-SR) 240 MG CR tablet Take 2 tablets (480 mg total) by mouth daily. 180 tablet 3 05/01/2020  . hydrALAZINE (APRESOLINE) 50 MG tablet Take 1 tablet (50 mg total) by mouth 3 (three) times daily. 270 tablet 3     Assessment: Pharmacy consulted to transition patient from apixaban to heparin IV for afib. Patient received dose of apixaban on 8/10 @ 2000 so will have to dose based on APTT until correlation with heparin levels.  Heparin level- 1.28 still elevated from apixaban   APTT- 78 therapeutic   Goal of Therapy:  Heparin level 0.3-0.7 units/ml aPTT 66-102 seconds Monitor platelets by anticoagulation protocol: Yes   Plan:  Continue heparin infusion at 1350 units/hr Check anti-Xa level in 8 hours and daily while on heparin Continue to monitor H&H and platelets  Revonda Standard Kahmari Koller 05/04/2020,10:59 PM

## 2020-05-04 NOTE — Progress Notes (Signed)
Paged critical result of troponin to Dr. Leontine Locket  Value of [phone]

## 2020-05-04 NOTE — Consult Note (Addendum)
NAME:  Connor Bryant, MRN:  [id number], DOB:  1946/08/02, LOS: 3 ADMISSION DATE:  05/01/2020, CONSULTATION DATE:  8/11 REFERRING MD:  Shah/ [facility], CHIEF COMPLAINT:  resp distress    Brief History   74 yowm quit smoking 10 y PTA   admitted with viz loss and R occipital cva as well as mod AS /anemia abruptly decompensated pm 8/10 and placed on vent and PCCM service asked to eval am 8/12   History of present illness   Per chart : Was started on Eliquis 78m BID on 05/03/2020. His Hgb was down to 7.5 and he reported dyspnea, therefore he received 1 unit pRBC's yesterday afternoon.   Around 2000 8/10, he developed worsening dyspnea and saturations were at 52% on 4L [place]. Was tachycardiac with HR in the 130's to 140's. Initially on NRB and required intubation. Was febrile to 101.7. EKG showed a narrow-complex tachycardia with ST depression along the lateral leads. ABG showed pH 7.282 with pCO2 45.6. Lactic Acid 2.9. BNP 1082. Mg 1.8. CBC showing Hgb improved to 10.7. D-dimer 3.83. Initial HS Troponin 452 with repeat values of 1128 and 2324.  CXR showing bilateral opacities and edema.   Past Medical History  He,  has a past medical history of Arthritis, DM type 2 (diabetes mellitus, type 2) ([facility], Gallstone, Heart murmur, HTN (hypertension), Hyperlipidemia, Personal history of colonic polyp-adenoma (11/24/2013), Renal insufficiency, and Thoracic spine fracture ([facility].    Significant Hospital Events   Acute pulmonary edema p trx 8/10 pm > et   Consults:  Cards 8/11  Procedures:    Significant Diagnostic Tests:  Echo 8/9  Mild LVE/ AS with mean gradient 18 and LA / RA nl size  Micro Data:  Covid 19  8/8 neg MRSA screen 8/10 neg  PCT  8/10 < 0.10   BC x 2  8/10 >>> ET 8/11 >>>  Antimicrobials:  Cefepime 8/10 >>> Vanc   IV  8/10  >>>  8/11    Scheduled Meds:  sodium chloride   Intravenous Once   allopurinol  100 mg Oral BID   aspirin EC  81 mg Oral Daily   atorvastatin  20 mg Oral  Daily   chlorhexidine gluconate (MEDLINE KIT)  15 mL Mouth Rinse BID   Chlorhexidine Gluconate Cloth  6 each Topical Daily   clopidogrel  75 mg Oral Daily   insulin aspart  0-5 Units Subcutaneous QHS   insulin aspart  0-9 Units Subcutaneous TID WC   ipratropium-albuterol  3 mL Nebulization Q6H   mouth rinse  15 mL Mouth Rinse 10 times per day   polyethylene glycol  8.5 g Oral Daily   Continuous Infusions:  heparin 1,350 Units/hr (05/04/20 0816)   midazolam 3 mg/hr (05/04/20 0919)   PRN Meds:.acetaminophen **OR** acetaminophen, fentaNYL (SUBLIMAZE) injection, hydrALAZINE, midazolam   Interim history/subjective:  Sedated on vent   Objective   Blood pressure (!) 124/57, pulse (!) 101, temperature 100.2 F (37.9 C), resp. rate 17, height 5' 8"  (1.727 m), weight 103.4 kg, SpO2 100 %.    Vent Mode: PRVC FiO2 (%):  [50 %-100 %] 50 % Set Rate:  [14 bmp] 14 bmp Vt Set:  [540 mL] 540 mL PEEP:  [5 cmH20] 5 cmH20 Plateau Pressure:  [15 cmH20-35 cmH20] 17 cmH20   Intake/Output Summary (Last 24 hours) at 05/04/2020 1323 Last data filed at 05/04/2020 1100 Gross per 24 hour  Intake 973.66 ml  Output 900 ml  Net 73.66 ml   FDanley Danker  Weights   05/01/20 1641  Weight: 103.4 kg    Examination: General: elderly wm nods approp   Tmax 101.7 HENT: oral et  Lungs: distant bs  Cardiovascular: RRR  With III/VI SEM with reduced S2  Abdomen: mildly distended, soft Extremities: warm s cc or E  Neuro: nods approp on versed drip at 3 mg /hr    I personally reviewed images and agree with radiology impression as follows:  pCXR 8/11  1. Stable support apparatus. 2. Persistent increased left basilar density suggesting atelectasis or infiltrate. 3. Minimal right basilar atelectasis.   Resolved Hospital Problem list      Assessment & Plan:  1 ) Acute resp distress/ pulmoary edema ? Related to trx with  in pt with Mod AS/ BNP > 1k during decompensation  - improved am 8/11 but not ready  to wean yet - diuresis as tol/ track bnp / keep sedated for now    2) Prob copd with air trapping mild  - rx with duoneb  3)  Fever ? Aspiration ? HCAP assoc with tmax > 101 - abx approp, see flowsheet - probably don't need vanc with elevated creat and neg mrsa nasal swab so will d/c and check ET gm stain and culture   4) Acute on chronic mild renal insff  = baseline 1.21 on 10/02/2018  On ACEi on admit Lab Results  Component Value Date   CREATININE 1.56 (H) 05/04/2020   CREATININE 1.59 (H) 05/03/2020   CREATININE 1.34 (H) 05/03/2020   monitor uop/ avoid nephrotoxins (vanc d/c'd)   Best practice:  Diet: npo Pain/Anxiety/Delirium protocol (if indicated): versed drip VAP protocol (if indicated):  DVT prophylaxis: hep IV  GI prophylaxis: ppi IV  Glucose control:  SSI  Mobility: bed rest Code Status: Full code  Family Communication: per [facility]ad Disposition: ICU   Labs   CBC: Recent Labs  Lab 05/01/20 1702 05/02/20 0400 05/03/20 0445 05/03/20 2133 05/04/20 0407  WBC 7.8 5.2 4.8 8.9 9.2  NEUTROABS 5.9  --   --   --   --   HGB 8.6* 8.0* 7.5* 10.7* 9.2*  HCT 26.5* 25.2* 24.4* 34.6* 31.8*  MCV 96.4 97.7 99.2 97.5 103.6*  PLT 211 176 172 266 161    Basic Metabolic Panel: Recent Labs  Lab 05/01/20 0502 05/01/20 1702 05/02/20 0400 05/03/20 0445 05/03/20 2133 05/04/20 0407  NA  --  136 138 140 139 141  K  --  4.0 3.6 3.9 4.0 4.2  CL  --  104 106 109 107 108  CO2  --  18* 21* 22 19* 19*  GLUCOSE  --  174* 122* 121* 283* 134*  BUN  --  40* 36* 27* 25* 26*  CREATININE  --  1.79* 1.54* 1.34* 1.59* 1.56*  CALCIUM  --  9.1 8.8* 8.5* 8.4* 8.5*  MG 1.8  --   --   --  1.8  --   PHOS  --   --   --  3.7 6.4*  --    GFR: Estimated Creatinine Clearance: 47.7 mL/min (A) (by C-G formula based on SCr of 1.56 mg/dL (H)). Recent Labs  Lab 05/02/20 0400 05/03/20 0445 05/03/20 2132 05/03/20 2133 05/03/20 2321 05/04/20 0407  PROCALCITON  --   --   --  <0.10  --   --   WBC 5.2  4.8  --  8.9  --  9.2  LATICACIDVEN  --   --  2.9*  --  1.5  --  Liver Function Tests: Recent Labs  Lab 05/01/20 1702 05/03/20 0445 05/03/20 2133  AST 23  --  24  ALT 20  --  18  ALKPHOS 60  --  70  BILITOT 1.0  --  1.3*  PROT 7.2  --  7.0  ALBUMIN 3.7 3.1* 3.5   No results for input(s): LIPASE, AMYLASE in the last 168 hours. No results for input(s): AMMONIA in the last 168 hours.  ABG    Component Value Date/Time   PHART 7.375 05/04/2020 0530   PCO2ART 37.3 05/04/2020 0530   PO2ART 99.0 05/04/2020 0530   HCO3 22.0 05/04/2020 0530   TCO2 26 03/11/2014 0432   ACIDBASEDEF 3.1 (H) 05/04/2020 0530   O2SAT 95.5 05/04/2020 0530     Coagulation Profile: Recent Labs  Lab 05/01/20 1702 05/03/20 2133  INR 1.1 1.0    Cardiac Enzymes: No results for input(s): CKTOTAL, CKMB, CKMBINDEX, TROPONINI in the last 168 hours.  HbA1C: HB A1C (BAYER DCA - WAIVED)  Date/Time Value Ref Range Status  02/29/2020 02:56 PM 6.1 <7.0 % Final    Comment:                                          Diabetic Adult            <7.0                                       Healthy Adult        4.3 - 5.7                                                           (DCCT/NGSP) American Diabetes Association's Summary of Glycemic Recommendations for Adults with Diabetes: Hemoglobin A1c <7.0%. More stringent glycemic goals (A1c <6.0%) may further reduce complications at the cost of increased risk of hypoglycemia.   08/27/2019 01:56 PM 6.3 <7.0 % Final    Comment:                                          Diabetic Adult            <7.0                                       Healthy Adult        4.3 - 5.7                                                           (DCCT/NGSP) American Diabetes Association's Summary of Glycemic Recommendations for Adults with Diabetes: Hemoglobin A1c <7.0%. More stringent glycemic goals (A1c <6.0%) may further reduce complications at the cost of increased risk of  hypoglycemia.    Hgb A1c  MFr Bld  Date/Time Value Ref Range Status  05/02/2020 04:00 AM 6.2 (H) 4.8 - 5.6 % Final    Comment:    (NOTE) Pre diabetes:          5.7%-6.4%  Diabetes:              >6.4%  Glycemic control for   <7.0% adults with diabetes     CBG: Recent Labs  Lab 05/03/20 2033 05/03/20 2348 05/04/20 0406 05/04/20 0724 05/04/20 1136  GLUCAP 170* 178* 133* 134* 132*       Surgical History    Past Surgical History:  Procedure Laterality Date   ABDOMINAL AORTAGRAM N/A 02/23/2014   Procedure: ABDOMINAL AORTAGRAM;  Surgeon: Conrad Rosslyn Farms, MD;  Location: [facility] CATH LAB;  Service: Cardiovascular;  Laterality: N/A;   COLONOSCOPY     DEBRIDEMENT TENNIS ELBOW Right    ESOPHAGOGASTRODUODENOSCOPY     MESENTERIC ARTERY BYPASS N/A 03/10/2014   Procedure: AORTO TO SUPERIOR MESENTERIC ARTERY BYPASS;  Surgeon: [name], MD;  Location[facility];  Service: Vascular;  Laterality: N/A;   TONSILLECTOMY AND ADENOIDECTOMY     vascualr surgery     illiac stents     Social History   reports that he quit smoking about 10 years ago. His smoking use included cigarettes. He has never used smokeless tobacco. He reports current alcohol use of about 12.0 standard drinks of alcohol per week. He reports previous drug use.   Family History   His family history includes AAA (abdominal aortic aneurysm) in his mother; Bladder Cancer in his father; Cancer in his father; Coronary artery disease in his father; Diabetes in his father and mother; Heart attack in his father; Heart disease in his father and mother; Hyperlipidemia in his father and mother; Hypertension in his father and mother. There is no history of Colon cancer, Throat cancer, Kidney disease, or Liver disease.   Allergies No Known Allergies   Home Medications  Prior to Admission medications   Medication Sig Start Date End Date Taking? Authorizing Provider  allopurinol (ZYLOPRIM) 100 MG tablet TAKE 1 TABLET 2 TO 3 TIMES A  DAY FOR GOUT 02/29/20  Yes Claretta Fraise, MD  aspirin 81 MG tablet Take 81 mg by mouth daily.   Yes [provider]  atorvastatin (LIPITOR) 20 MG tablet Take 1 tablet (20 mg total) by mouth daily. 02/29/20  Yes Stacks, Cletus Gash, MD  clopidogrel (PLAVIX) 75 MG tablet Take 75 mg by mouth daily. 04/27/20  Yes [provider]  hydrALAZINE (APRESOLINE) 100 MG tablet Take 100 mg by mouth 3 (three) times daily. 03/03/20  Yes [provider]  hydrochlorothiazide (HYDRODIURIL) 25 MG tablet TAKE 1 OR 2 TABLETS DAILY IN THE MORNING AS NEEDED 02/29/20  Yes Stacks, Cletus Gash, MD  hydrochlorothiazide (HYDRODIURIL) 25 MG tablet Take 12.5 mg by mouth daily. 02/29/20  Yes [provider]  loratadine (CLARITIN) 10 MG tablet Take 1 tablet (10 mg total) by mouth daily. 08/27/19  Yes Terald Sleeper, PA-C  metFORMIN (GLUCOPHAGE-XR) 500 MG 24 hr tablet 2 tablet with evening meal Once a day 02/29/20  Yes Stacks, Cletus Gash, MD  metoprolol (TOPROL-XL) 200 MG 24 hr tablet Take 1 tablet (200 mg total) by mouth daily. 02/29/20  Yes Stacks, Cletus Gash, MD  pioglitazone (ACTOS) 30 MG tablet Take 1 tablet (30 mg total) by mouth daily. 02/29/20  Yes Stacks, Cletus Gash, MD  polyethylene glycol (MIRALAX / GLYCOLAX) packet Take 17 g by mouth daily. Patient taking differently: Take 8.5  g by mouth daily.  10/02/18  Yes Gherghe, Vella Redhead, MD  trandolapril (MAVIK) 4 MG tablet Take 1 tablet (4 mg total) by mouth daily. 02/29/20  Yes Claretta Fraise, MD  verapamil (CALAN-SR) 240 MG CR tablet Take 2 tablets (480 mg total) by mouth daily. 02/29/20  Yes Claretta Fraise, MD  hydrALAZINE (APRESOLINE) 50 MG tablet Take 1 tablet (50 mg total) by mouth 3 (three) times daily. 02/29/20   Claretta Fraise, MD

## 2020-05-04 NOTE — Progress Notes (Signed)
CRITICAL VALUE ALERT  Critical Value:  Troponin 1128  Date & Time Notied:  05/04/20  Provider Notified: Olevia Bowens  Orders Received/Actions taken:

## 2020-05-04 NOTE — Progress Notes (Signed)
*  PRELIMINARY RESULTS* Echocardiogram 2D Echocardiogram LIMITED WITH DEFINITY has been performed.  Leavy Cella 05/04/2020, 2:34 PM

## 2020-05-04 NOTE — Progress Notes (Signed)
Vent filter had to be replaced, had coughing period and heartrate changed from sinus rhythm to A=fib. Urine has become bloody from clear yellow this morning. Heparin is being infused.

## 2020-05-04 NOTE — Progress Notes (Signed)
TRH night shift.  Antibiotics were discontinued after procalcitonin was reported normal.  His troponin level has risen to 1128 ng/L. He received apixaban yesterday evening and heparin per pharmacy consult was requested.heparin per pharmacy consult was requested. I will consult cardiology in AM if trend continues upward.   Tennis Must, MD

## 2020-05-04 NOTE — Progress Notes (Signed)
PROGRESS NOTE    Connor Bryant  JJO:[id number] DOB: [birth year] DOA: 05/01/2020 PCP: Claretta Fraise, MD   Brief Narrative:  ZSW:FUXNAT C Estesis a 74 y.o.malewith medical history significant forrecent iliac stents, diabetes mellitus, hypertension. Patient presented to the ED today with complaints of losing vision in his left eye,started yesterday.Patient woke up yesterday-Saturday morning with this, and this persisted today. Reports it feels like a dark shadow on the peripheral side ofhis left eye. He also reports some tingling in his left arm-this has resolved. No strokes. He denies weakness of his extremities. No change in speech, no facial asymmetry. Patient took his aspirin and Plavix and statins today. Patient had bilateral iliac stents,2 on the right, one on the left placed about 5 days ago at [facility].  The only cardiac history patient is aware of is a cardiac murmur. He is not aware of history of irregular heart rhythm.  ED Course:Blood pressure systolic 1 1 5-1 2 7, heart rate 49-59,hemoglobin 8.6,recent baseline about 11.Creatinine 1.7, about baseline. Head CT shows acute right occipital infarct. EKG showing atrial fibrillation. Telemetry neurology consulted-patient outside TPA window, stroke work-up.Dualantiplatelet for now. Hospitalist to admit for acute CVA.  8/11: Patient patient admitted with lateral vision loss to left eye with noted acute right-sided CVA.  Overnight he has developed acute hypoxemic respiratory failure and required intubation on mechanical ventilator.  He has been noted to have troponin elevation suggestive of NSTEMI and has been started on heparin drip.  Cardiology following as well as pulmonology for vent management.  There is suspicion he may have also aspirated with potential for HCAP.  Assessment & Plan:   Principal Problem:   Acute CVA (cerebrovascular accident) [facility]) Active Problems:   Peripheral vascular  disease ([place])   Gout   Essential hypertension   Type 2 diabetes mellitus without complication, without long-term current use of insulin (HCC)   Pure hypercholesterolemia   S/P insertion of iliac artery stent   Unspecified atrial fibrillation (HCC)   Elevated troponin   Fever   Positive D dimer   Acute respiratory failure with hypoxia ([place])   Endotracheally intubated   Acute hypoxemic respiratory failure status post intubation 8/10 -Likely with some component of pulmonary edema as well as aspiration with concern for HCAP -Given Lasix overnight, with Lasix continued per cardiology -DuoNeb for potential component of air trapping with probable COPD -Continue on cefepime for coverage of potential HCAP, vancomycin discontinued as MRSA negative -Continue ventilator management per pulmonology recommendations  -D dimer elevated, currently on heparin drip. Will consider V/Q scan vs PE study if needed  NSTEMI -Heparin for anticoagulation -Continue on Plavix and aspirin with no further Eliquis at this time -Continue atorvastatin -Beta-blocker to resume once blood pressure stabilizes -Limited 2D echocardiogram pending -IV Lasix x1 dose today -No need for immediate catheterization, continue to follow per cardiology -Continue to trend troponin with EKG in a.m.  Acute right occipital CVA with vision loss to left eye -LDL 54 -A1c 6.2% -He was started on Eliquis with recommendation to continue Plavix by his vascular surgeon Dr. Rosana Hoes at [facility] will need 30-day Holter monitoring in outpatient setting -Appreciate neurology recommendations  Stage III CKD with mild renal insufficiency -Continue to monitor with ongoing diuresis -Baseline creatinine 1.2-1.3  Probable paroxysmal atrial fibrillation -Likely to require outpatient 30-day monitor for further evaluation given acute CVA -Continue heparin for now for anticoagulation and hold Eliquis -Continue to monitor on telemetry and  use beta-blocker for rate control  as needed  Anemia-chronic iron deficiency anemia noted -Improved after 1 unit PRBC transfusion -Plan to follow CBC -No overt bleeding identified  Diabetes, non-insulin-dependent with nephropathy -Holding home oral Metformin and pioglitazone -Continue SSI with stable blood glucose readings noted  History of COPD-Long history of tobacco abuse -We will need outpatient pulmonology follow-up for PFTs -Continue on duo nebs every 6 hours  PVD -Status post mesenteric artery bypass with recent bilateral iliac stenting at [facility] 04/27/2020 -Plan to continue on Eliquis and Plavix outpatient and hold aspirin, per discussion with vascular surgeon at [facility]  Hypertension -Soft this a.m., discontinue propofol and try Versed for sedation -Home medications currently held  DVT prophylaxis: Heparin drip Code Status: Full code Family Communication: Discussed with son on phone 8/11 Disposition Plan:  Status is: Inpatient  Remains inpatient appropriate because:Ongoing diagnostic testing needed not appropriate for outpatient work up, IV treatments appropriate due to intensity of illness or inability to take PO and Inpatient level of care appropriate due to severity of illness   Dispo: The patient is from: Home              Anticipated d/c is to: SNF              Anticipated d/c date is: 3 days              Patient currently is not medically stable to d/c.  Consultants:   Cardiology  Neurology  Pulmonology  Procedures:   See below  Antimicrobials:  Anti-infectives (From admission, onward)   Start     Dose/Rate Route Frequency Ordered Stop   05/04/20 2200  vancomycin (VANCOREADY) IVPB 1250 mg/250 mL  Status:  Discontinued        1,250 mg 166.7 mL/hr over 90 Minutes Intravenous Every 24 hours 05/03/20 2335 05/04/20 0118   05/04/20 1400  ceFEPIme (MAXIPIME) 2 g in sodium chloride 0.9 % 100 mL IVPB     Discontinue     2 g 200 mL/hr over 30  Minutes Intravenous Every 12 hours 05/04/20 1348     05/04/20 1000  ceFEPIme (MAXIPIME) 2 g in sodium chloride 0.9 % 100 mL IVPB  Status:  Discontinued        2 g 200 mL/hr over 30 Minutes Intravenous Every 12 hours 05/03/20 2335 05/04/20 0942   05/03/20 2315  vancomycin (VANCOREADY) IVPB 2000 mg/400 mL        2,000 mg 200 mL/hr over 120 Minutes Intravenous  Once 05/03/20 2303 05/04/20 0153   05/03/20 2315  ceFEPIme (MAXIPIME) 2 g in sodium chloride 0.9 % 100 mL IVPB  Status:  Discontinued        2 g 200 mL/hr over 30 Minutes Intravenous  Once 05/03/20 2303 05/04/20 0118       Subjective: Patient seen and evaluated today, he is sedated on the ventilator.  No bleeding or other issues per nursing staff.  Objective: Vitals:   05/04/20 0915 05/04/20 1000 05/04/20 1100 05/04/20 1208  BP: (!) 106/45 (!) 131/52 (!) 122/51 (!) 124/57  Pulse:   (!) 101   Resp:  17 17   Temp:  99.9 F (37.7 C) 100.2 F (37.9 C)   TempSrc:      SpO2: 100%  97% 100%  Weight:      Height:        Intake/Output Summary (Last 24 hours) at 05/04/2020 1437 Last data filed at 05/04/2020 1100 Gross per 24 hour  Intake 973.66 ml  Output 900 ml  Net 73.66 ml   Filed Weights   05/01/20 1641  Weight: 103.4 kg    Examination:  General exam: Sedated on ventilator Respiratory system: Coarse bilaterally.  Intubated on FiO2 60% Cardiovascular system: S1 & S2 heard, RRR Gastrointestinal system: Abdomen is soft Central nervous system: Sedated on ventilator Extremities: No edema Skin: No rashes, lesions or ulcers Psychiatry: Cannot be assessed    Data Reviewed: I have personally reviewed following labs and imaging studies  CBC: Recent Labs  Lab 05/01/20 1702 05/02/20 0400 05/03/20 0445 05/03/20 2133 05/04/20 0407  WBC 7.8 5.2 4.8 8.9 9.2  NEUTROABS 5.9  --   --   --   --   HGB 8.6* 8.0* 7.5* 10.7* 9.2*  HCT 26.5* 25.2* 24.4* 34.6* 31.8*  MCV 96.4 97.7 99.2 97.5 103.6*  PLT 211 176 172 266 466    Basic Metabolic Panel: Recent Labs  Lab 05/01/20 0502 05/01/20 1702 05/02/20 0400 05/03/20 0445 05/03/20 2133 05/04/20 0407  NA  --  136 138 140 139 141  K  --  4.0 3.6 3.9 4.0 4.2  CL  --  104 106 109 107 108  CO2  --  18* 21* 22 19* 19*  GLUCOSE  --  174* 122* 121* 283* 134*  BUN  --  40* 36* 27* 25* 26*  CREATININE  --  1.79* 1.54* 1.34* 1.59* 1.56*  CALCIUM  --  9.1 8.8* 8.5* 8.4* 8.5*  MG 1.8  --   --   --  1.8  --   PHOS  --   --   --  3.7 6.4*  --    GFR: Estimated Creatinine Clearance: 47.7 mL/min (A) (by C-G formula based on SCr of 1.56 mg/dL (H)). Liver Function Tests: Recent Labs  Lab 05/01/20 1702 05/03/20 0445 05/03/20 2133  AST 23  --  24  ALT 20  --  18  ALKPHOS 60  --  70  BILITOT 1.0  --  1.3*  PROT 7.2  --  7.0  ALBUMIN 3.7 3.1* 3.5   No results for input(s): LIPASE, AMYLASE in the last 168 hours. No results for input(s): AMMONIA in the last 168 hours. Coagulation Profile: Recent Labs  Lab 05/01/20 1702 05/03/20 2133  INR 1.1 1.0   Cardiac Enzymes: No results for input(s): CKTOTAL, CKMB, CKMBINDEX, TROPONINI in the last 168 hours. BNP (last 3 results) No results for input(s): PROBNP in the last 8760 hours. HbA1C: Recent Labs    05/02/20 0400  HGBA1C 6.2*   CBG: Recent Labs  Lab 05/03/20 2033 05/03/20 2348 05/04/20 0406 05/04/20 0724 05/04/20 1136  GLUCAP 170* 178* 133* 134* 132*   Lipid Profile: Recent Labs    05/02/20 0400 05/03/20 2133  CHOL 126  --   HDL 58  --   LDLCALC 54  --   TRIG 68 109  CHOLHDL 2.2  --    Thyroid Function Tests: Recent Labs    05/01/20 2021  TSH 5.072*   Anemia Panel: Recent Labs    05/02/20 0400  FERRITIN 83  TIBC 313  IRON 38*   Sepsis Labs: Recent Labs  Lab 05/03/20 2132 05/03/20 2133 05/03/20 2321  PROCALCITON  --  <0.10  --   LATICACIDVEN 2.9*  --  1.5    Recent Results (from the past 240 hour(s))  SARS Coronavirus 2 by RT PCR (hospital order, performed in [facility] hospital lab) Nasopharyngeal Nasopharyngeal Swab     Status: None   Collection Time: 05/01/20  7:43 PM   Specimen: Nasopharyngeal Swab  Result Value Ref Range Status   SARS Coronavirus 2 NEGATIVE NEGATIVE Final    Comment: (NOTE) SARS-CoV-2 target nucleic acids are NOT DETECTED.  The SARS-CoV-2 RNA is generally detectable in upper and lower respiratory specimens during the acute phase of infection. The lowest concentration of SARS-CoV-2 viral copies this assay can detect is 250 copies / mL. A negative result does not preclude SARS-CoV-2 infection and should not be used as the sole basis for treatment or other patient management decisions.  A negative result may occur with improper specimen collection / handling, submission of specimen other than nasopharyngeal swab, presence of viral mutation(s) within the areas targeted by this assay, and inadequate number of viral copies (<250 copies / mL). A negative result must be combined with clinical observations, patient history, and epidemiological information.  Fact Sheet for Patients:   StrictlyIdeas.no  Fact Sheet for Healthcare Providers: BankingDealers.co.za  This test is not yet approved or  cleared by the [place] FDA and has been authorized for detection and/or diagnosis of SARS-CoV-2 by FDA under an Emergency Use Authorization (EUA).  This EUA will remain in effect (meaning this test can be used) for the duration of the COVID-19 declaration under Section 564(b)(1) of the Act, 21 U.S.C. section 360bbb-3(b)(1), unless the authorization is terminated or revoked sooner.  Performed at [facility], [address] [place], [place] [address]   MRSA PCR Screening     Status: None   Collection Time: 05/03/20  9:46 PM   Specimen: Nasal Mucosa; Nasopharyngeal  Result Value Ref Range Status   MRSA by PCR NEGATIVE NEGATIVE Final    Comment:        The GeneXpert MRSA Assay  (FDA approved for NASAL specimens only), is one component of a comprehensive MRSA colonization surveillance program. It is not intended to diagnose MRSA infection nor to guide or monitor treatment for MRSA infections. Performed at [facility], [address], [place], [place] [address]   Culture, blood (x 2)     Status: None (Preliminary result)   Collection Time: 05/03/20 11:21 PM   Specimen: BLOOD  Result Value Ref Range Status   Specimen Description BLOOD  Final   Special Requests NONE  Final   Culture   Final    NO GROWTH < 12 HOURS Performed at [facility], [address], [place], [place] [address]    Report Status PENDING  Incomplete  Culture, blood (x 2)     Status: None (Preliminary result)   Collection Time: 05/03/20 11:26 PM   Specimen: BLOOD RIGHT HAND  Result Value Ref Range Status   Specimen Description BLOOD RIGHT HAND  Final   Special Requests   Final    BOTTLES DRAWN AEROBIC AND ANAEROBIC Blood Culture adequate volume   Culture   Final    NO GROWTH < 12 HOURS Performed at [facility], [address], [place], [place] [address]    Report Status PENDING  Incomplete         Radiology Studies: DG Chest 2 View  Result Date: 05/03/2020 CLINICAL DATA:  Shortness of breath EXAM: CHEST - 2 VIEW COMPARISON:  03/15/2014 FINDINGS: Cardiac shadow is mildly prominent. Aortic calcifications are noted. Pleural calcifications are seen in the apices. No focal infiltrate or sizable effusion is seen. Degenerative changes of the thoracic spine are noted. Old left clavicular fracture with healing is seen. IMPRESSION: No acute abnormality noted. Electronically Signed   By: Inez Catalina  M.D.   On: 05/03/2020 13:33   DG CHEST PORT 1 VIEW  Result Date: 05/04/2020 CLINICAL DATA:  Respiratory failure. EXAM: PORTABLE CHEST 1 VIEW COMPARISON:  05/03/2020 FINDINGS: The endotracheal tube is 3.2 cm above the carina. The NG tube is coursing down the esophagus and into the stomach. The  heart is enlarged but stable. Stable tortuosity and calcification of the thoracic aorta. Increased density at the left lung base could suggest atelectasis or infiltrate. Streaky right basilar atelectasis also noted. No pulmonary edema or pneumothorax. IMPRESSION: 1. Stable support apparatus. 2. Persistent increased left basilar density suggesting atelectasis or infiltrate. 3. Minimal right basilar atelectasis. Electronically Signed   By: Marijo Sanes M.D.   On: 05/04/2020 07:24   DG CHEST PORT 1 VIEW  Result Date: 05/03/2020 CLINICAL DATA:  Shortness of breath, intubated EXAM: PORTABLE CHEST 1 VIEW COMPARISON:  05/03/2020 FINDINGS: Endotracheal tube is 4 cm above the carina. NG tube enters the stomach. Bilateral perihilar airspace opacities and interstitial prominence concerning for edema. Heart is borderline in size. No visible effusions or acute bony abnormality. IMPRESSION: Bilateral perihilar opacities and diffuse interstitial prominence, likely edema/CHF. Electronically Signed   By: Rolm Baptise M.D.   On: 05/03/2020 21:29   ECHOCARDIOGRAM COMPLETE  Result Date: 05/02/2020    ECHOCARDIOGRAM REPORT   Patient Name:   GURLEY CLIMER Date of Exam: 05/02/2020 Medical Rec #:  [id number]      Height:       68.0 in Accession #:    [id number]     Weight:       228.0 lb Date of Birth:  11/28/1945      BSA:          2.161 m Patient Age:    62 years       BP:           163/54 mmHg Patient Gender: M              HR:           66 bpm. Exam Location:  [facility] Procedure: 2D Echo, Cardiac Doppler and Color Doppler Indications:    CVA  History:        Patient has no prior history of Echocardiogram examinations.                 Signs/Symptoms:Murmur; Risk Factors:Hypertension, Dyslipidemia                 and Diabetes. Vision loss.  Sonographer:    Dustin Flock RDCS Referring Phys: [place]  1. Left ventricular ejection fraction, by estimation, is 60 to 65%. The left ventricle has normal  function. Left ventricular endocardial border not optimally defined to evaluate regional wall motion. The left ventricular internal cavity size was mildly dilated. There is mild left ventricular hypertrophy. Left ventricular diastolic parameters are indeterminate.  2. Right ventricular systolic function is normal. The right ventricular size is normal.  3. The mitral valve is abnormal. Trivial mitral valve regurgitation.  4. AV is thickened, calcified with mildly restricted motion. Peak and mean gradients through the valve are 31 and 18 mm Hg respectively. . The aortic valve is abnormal. Aortic valve regurgitation is not visualized. FINDINGS  Left Ventricle: Left ventricular ejection fraction, by estimation, is 60 to 65%. The left ventricle has normal function. Left ventricular endocardial border not optimally defined to evaluate regional wall motion. The left ventricular internal cavity size was mildly dilated. There is mild left ventricular  hypertrophy. Left ventricular diastolic parameters are indeterminate. Right Ventricle: The right ventricular size is normal. Right vetricular wall thickness was not assessed. Right ventricular systolic function is normal. Left Atrium: Left atrial size was normal in size. Right Atrium: Right atrial size was normal in size. Pericardium: There is no evidence of pericardial effusion. Mitral Valve: The mitral valve is abnormal. There is mild thickening of the mitral valve leaflet(s). Trivial mitral valve regurgitation. Tricuspid Valve: The tricuspid valve is grossly normal. Tricuspid valve regurgitation is not demonstrated. Aortic Valve: AV is thickened, calcified with mildly restricted motion. Peak and mean gradients through the valve are 31 and 18 mm Hg respectively. The aortic valve is abnormal. Aortic valve regurgitation is not visualized. Aortic valve mean gradient measures 17.5 mmHg. Aortic valve peak gradient measures 28.2 mmHg. Aortic valve area, by VTI measures 2.55 cm.  Pulmonic Valve: The pulmonic valve was not well visualized. Pulmonic valve regurgitation is not visualized. No evidence of pulmonic stenosis. Aorta: The aortic root is normal in size and structure. IAS/Shunts: The interatrial septum was not assessed.  LEFT VENTRICLE PLAX 2D LVIDd:         5.52 cm  Diastology LVIDs:         4.31 cm  LV e' lateral:   13.40 cm/s LV PW:         1.58 cm  LV E/e' lateral: 7.5 LV IVS:        1.24 cm  LV e' medial:    6.09 cm/s LVOT diam:     3.00 cm  LV E/e' medial:  16.4 LV SV:         175 LV SV Index:   81 LVOT Area:     7.07 cm  RIGHT VENTRICLE RV Basal diam:  3.16 cm RV S prime:     12.70 cm/s TAPSE (M-mode): 2.8 cm LEFT ATRIUM             Index       RIGHT ATRIUM           Index LA diam:        5.20 cm 2.41 cm/m  RA Area:     15.30 cm LA Vol (A2C):   38.6 ml 17.86 ml/m RA Volume:   36.50 ml  16.89 ml/m LA Vol (A4C):   52.3 ml 24.20 ml/m LA Biplane Vol: 44.7 ml 20.69 ml/m  AORTIC VALVE AV Area (Vmax):    2.63 cm AV Area (Vmean):   2.51 cm AV Area (VTI):     2.55 cm AV Vmax:           265.67 cm/s AV Vmean:          200.500 cm/s AV VTI:            0.686 m AV Peak Grad:      28.2 mmHg AV Mean Grad:      17.5 mmHg LVOT Vmax:         99.00 cm/s LVOT Vmean:        71.300 cm/s LVOT VTI:          0.247 m LVOT/AV VTI ratio: 0.36  AORTA Ao Root diam: 4.00 cm MITRAL VALVE MV Area (PHT): 2.84 cm    SHUNTS MV Decel Time: 267 msec    Systemic VTI:  0.25 m MV E velocity: 99.90 cm/s  Systemic Diam: 3.00 cm MV A velocity: 94.60 cm/s MV E/A ratio:  1.06 Dorris Carnes MD Electronically signed by Dorris Carnes MD Signature Date/Time: 05/02/2020/5:25:05  PM    Final         Scheduled Meds: . sodium chloride   Intravenous Once  . allopurinol  100 mg Oral BID  . aspirin EC  81 mg Oral Daily  . atorvastatin  20 mg Oral Daily  . chlorhexidine gluconate (MEDLINE KIT)  15 mL Mouth Rinse BID  . Chlorhexidine Gluconate Cloth  6 each Topical Daily  . clopidogrel  75 mg Oral Daily  . insulin aspart   0-5 Units Subcutaneous QHS  . insulin aspart  0-9 Units Subcutaneous TID WC  . ipratropium-albuterol  3 mL Nebulization Q6H  . mouth rinse  15 mL Mouth Rinse 10 times per day  . pantoprazole (PROTONIX) IV  40 mg Intravenous Q24H  . polyethylene glycol  8.5 g Oral Daily   Continuous Infusions: . ceFEPime (MAXIPIME) IV 2 g (05/04/20 1436)  . heparin 1,350 Units/hr (05/04/20 0816)  . midazolam 3 mg/hr (05/04/20 0919)     LOS: 3 days    Time spent: 30 minutes    Macario Shear D Manuella Ghazi, DO [facility]  If 7PM-7AM, please contact night-coverage [email or web address] 05/04/2020, 2:37 PM

## 2020-05-04 NOTE — Consult Note (Addendum)
Cardiology Consult    Patient ID: Connor Bryant; [id number]; 06-27-1946   Admit date: 05/01/2020 Date of Consult: 05/04/2020  Primary Care Provider: Claretta Fraise, MD Primary Cardiologist: New to [facility] - Dr. Harl Bowie  Patient Profile    Connor Bryant is a 74 y.o. male with past medical history of PVD (s/p prior mesenteric artery bypass and recent bilateral iliac stenting at [facility] on 04/27/2020), HTN, HLD, Type 2 DM and Stage 3 CKD who is being seen today for the evaluation of an NSTEMI at the request of Dr. Manuella Ghazi.   History of Present Illness    Connor Bryant presented to [facility] ED on 05/01/2020 for evaluation of left-sided vision loss which had started two days prior to arrival. Initial labs showed WBC 7.8, Hgb 8.6, platelets 211, Na+ 136, K+ 4.0 and creatinine 1.79 (previously 1.24 on 04/27/2020). TSH 5.072. COVID negative. EKG on admission showed a computer generated read of atrial fibrillation but p-waves are present along the rhythm strip and appears most consistent with sinus bradycardia with 1st degree AV block. Repeat later that evening definitively shows NSR. CT Head showed an acute right occipital infarct. MRI Brain confirmed the acute right occipital infarct and there was nonvisualization of the proximal right vertebral artery inferior to the level of the glottis, concerning for occlusion.  Neurology was consulted but he was outside the window of TPA. Given the concern for new-onset atrial fibrillation, his surgeon at [facility] was contacted and recommended continuing Plavix and stopping ASA if starting anticoagulation. He did have an echocardiogram which showed a preserved EF of 60-65% with no regional WMA. Was noted to have mild LVH and trivial MR.   Was started on Eliquis 15m BID on 05/03/2020. His Hgb was down to 7.5 and he reported dyspnea, therefore he received 1 unit pRBC's yesterday afternoon.   Around 2000, he developed worsening dyspnea and saturations were at 52% on 4L  [place]. Was tachycardiac with HR in the 130's to 140's. Initially on NRB and required intubation. Was febrile to 101.7. EKG showed a narrow-complex tachycardia with ST depression along the lateral leads. ABG showed pH 7.282 with pCO2 45.6. Lactic Acid 2.9. BNP 1082. Mg 1.8. CBC showing Hgb improved to 10.7. D-dimer 3.83. Initial HS Troponin 452 with repeat values of 1128 and 2324. Blood cultures pending. CXR showing bilateral opacities and edema.   He has been started on Cefepime. Received 20 mg IV Lasix for one dose at 2339. I&O's not recorded and no weights have been recorded since admission (228 lbs at that time). Repeat EKG this AM shows NSR, HR 86 with ST depression having improved when compared to prior tracings.    Past Medical History:  Diagnosis Date  . Arthritis   . DM type 2 (diabetes mellitus, type 2) (HCC)    no meds  . Gallstone   . Heart murmur    was told one time he had a murmur, not since 30 years ago  . HTN (hypertension)   . Hyperlipidemia   . Personal history of colonic polyp-adenoma 11/24/2013  . Renal insufficiency    mild  . Thoracic spine fracture ([facility]     Past Surgical History:  Procedure Laterality Date  . ABDOMINAL AORTAGRAM N/A 02/23/2014   Procedure: ABDOMINAL AMaxcine Ham  Surgeon: BConrad Hudson MD;  [facility];  Service: Cardiovascular;  Laterality: N/A;  . COLONOSCOPY    . DEBRIDEMENT TENNIS ELBOW Right   . ESOPHAGOGASTRODUODENOSCOPY    .  MESENTERIC ARTERY BYPASS N/A 03/10/2014   Procedure: AORTO TO SUPERIOR MESENTERIC ARTERY BYPASS;  Surgeon: [name], MD;  Location: [place];  Service: Vascular;  Laterality: N/A;  . TONSILLECTOMY AND ADENOIDECTOMY    . vascualr surgery     illiac stents     Home Medications:  Prior to Admission medications   Medication Sig Start Date End Date Taking? Authorizing Provider  allopurinol (ZYLOPRIM) 100 MG tablet TAKE 1 TABLET 2 TO 3 TIMES A DAY FOR GOUT 02/29/20  Yes Claretta Fraise, MD  aspirin 81 MG tablet Take  81 mg by mouth daily.   Yes [provider]  atorvastatin (LIPITOR) 20 MG tablet Take 1 tablet (20 mg total) by mouth daily. 02/29/20  Yes Stacks, Cletus Gash, MD  clopidogrel (PLAVIX) 75 MG tablet Take 75 mg by mouth daily. 04/27/20  Yes [provider]  hydrALAZINE (APRESOLINE) 100 MG tablet Take 100 mg by mouth 3 (three) times daily. 03/03/20  Yes [provider]  hydrochlorothiazide (HYDRODIURIL) 25 MG tablet TAKE 1 OR 2 TABLETS DAILY IN THE MORNING AS NEEDED 02/29/20  Yes Stacks, Cletus Gash, MD  hydrochlorothiazide (HYDRODIURIL) 25 MG tablet Take 12.5 mg by mouth daily. 02/29/20  Yes [provider]  loratadine (CLARITIN) 10 MG tablet Take 1 tablet (10 mg total) by mouth daily. 08/27/19  Yes Terald Sleeper, PA-C  metFORMIN (GLUCOPHAGE-XR) 500 MG 24 hr tablet 2 tablet with evening meal Once a day 02/29/20  Yes Stacks, Cletus Gash, MD  metoprolol (TOPROL-XL) 200 MG 24 hr tablet Take 1 tablet (200 mg total) by mouth daily. 02/29/20  Yes Stacks, Cletus Gash, MD  pioglitazone (ACTOS) 30 MG tablet Take 1 tablet (30 mg total) by mouth daily. 02/29/20  Yes Stacks, Cletus Gash, MD  polyethylene glycol (MIRALAX / GLYCOLAX) packet Take 17 g by mouth daily. Patient taking differently: Take 8.5 g by mouth daily.  10/02/18  Yes Gherghe, Vella Redhead, MD  trandolapril (MAVIK) 4 MG tablet Take 1 tablet (4 mg total) by mouth daily. 02/29/20  Yes Claretta Fraise, MD  verapamil (CALAN-SR) 240 MG CR tablet Take 2 tablets (480 mg total) by mouth daily. 02/29/20  Yes Claretta Fraise, MD  hydrALAZINE (APRESOLINE) 50 MG tablet Take 1 tablet (50 mg total) by mouth 3 (three) times daily. 02/29/20   Claretta Fraise, MD    Inpatient Medications: Scheduled Meds: . sodium chloride   Intravenous Once  . allopurinol  100 mg Oral BID  . atorvastatin  20 mg Oral Daily  . chlorhexidine gluconate (MEDLINE KIT)  15 mL Mouth Rinse BID  . Chlorhexidine Gluconate Cloth  6 each Topical Daily  . clopidogrel  75 mg Oral Daily  . furosemide  40 mg  Intravenous Once  . insulin aspart  0-5 Units Subcutaneous QHS  . insulin aspart  0-9 Units Subcutaneous TID WC  . ipratropium-albuterol  3 mL Nebulization Q6H  . mouth rinse  15 mL Mouth Rinse 10 times per day  . polyethylene glycol  8.5 g Oral Daily   Continuous Infusions: . ceFEPime (MAXIPIME) IV    . heparin 1,350 Units/hr (05/04/20 0816)  . midazolam 3 mg/hr (05/04/20 0919)   PRN Meds: acetaminophen **OR** acetaminophen, fentaNYL (SUBLIMAZE) injection, hydrALAZINE, midazolam  Allergies:   No Known Allergies  Social History:   Social History   Socioeconomic History  . Marital status: Married    Spouse name: Not on file  . Number of children: Not on file  . Years of education: Not on file  . Highest  education level: Not on file  Occupational History  . Not on file  Tobacco Use  . Smoking status: Former Smoker    Types: Cigarettes    Quit date: 07/08/2009    Years since quitting: 10.8  . Smokeless tobacco: Never Used  Vaping Use  . Vaping Use: Never used  Substance and Sexual Activity  . Alcohol use: Yes    Alcohol/week: 12.0 standard drinks    Types: 12 Cans of beer per week    Comment: 8 beers a week  . Drug use: Not Currently  . Sexual activity: Not on file  Other Topics Concern  . Not on file  Social History Narrative   Patient is married, he is retired. 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   Social Determinants of Health   Financial Resource Strain:   . Difficulty of Paying Living Expenses:   Food Insecurity:   . Worried About Charity fundraiser in the Last Year:   . Arboriculturist in the Last Year:   Transportation Needs:   . Film/video editor (Medical):   Marland Kitchen Lack of Transportation (Non-Medical):   Physical Activity:   . Days of Exercise per Week:   . Minutes of Exercise per Session:   Stress:   . Feeling of Stress :   Social Connections:   . Frequency of Communication with Friends and Family:   . Frequency of Social  Gatherings with Friends and Family:   . Attends Religious Services:   . Active Member of Clubs or Organizations:   . Attends Archivist Meetings:   Marland Kitchen Marital Status:   Intimate Partner Violence:   . Fear of Current or Ex-Partner:   . Emotionally Abused:   Marland Kitchen Physically Abused:   . Sexually Abused:      Family History:    Family History  Problem Relation Age of Onset  . Hypertension Father   . Coronary artery disease Father   . Hyperlipidemia Father   . Diabetes Father   . Bladder Cancer Father   . Cancer Father   . Heart disease Father   . Heart attack Father   . Diabetes Mother   . Heart disease Mother        before age 55  . Hyperlipidemia Mother   . Hypertension Mother   . AAA (abdominal aortic aneurysm) Mother   . Colon cancer Neg Hx   . Throat cancer Neg Hx   . Kidney disease Neg Hx   . Liver disease Neg Hx       Review of Systems    Unable to be obtained. Patient currently intubated and sedated.   Physical Exam/Data    Vitals:   05/04/20 0645 05/04/20 0700 05/04/20 0800 05/04/20 0915  BP: (!) 104/45 (!) 102/41 (!) 102/43 (!) 106/45  Pulse:  90 91   Resp: _0 Temp: 99.7 F (37.6 C) 99.7 F (37.6 C) 99.9 F (37.7 C)   TempSrc:      SpO2:  99% 97% 100%  Weight:      Height:        Intake/Output Summary (Last 24 hours) at 05/04/2020 0942 Last data filed at 05/04/2020 0816 Gross per 24 hour  Intake 917.88 ml  Output 900 ml  Net 17.88 ml   Filed Weights   05/01/20 1641  Weight: 103.4 kg   Body mass index is 34.67 kg/m.   General: Ill-appearing male. Currently in NAD.  Psych: Unable to be assessed. Sedated.  Neuro: Currently sedated.  HEENT: Intubated.   Neck: Supple without bruits or JVD. Lungs:  Resp regular. Decreased breath sounds along bases. Currently intubated.  Heart: RRR no s3, s4, or murmurs. Abdomen: Soft, non-tender, non-distended, BS + x 4.  Extremities: No clubbing, cyanosis or lower extremity edema.  DP/PT/Radials 2+ and equal bilaterally.   EKG:  The EKG was personally reviewed and demonstrates: NSR, HR 86 with ST depression having improved when compared to prior tracings.   Telemetry:  Telemetry was personally reviewed and demonstrates: NSR, HR in 70's to 80's.    Labs/Studies     Relevant CV Studies:  Peripheral Angiography: 04/27/2020 PREOPERATIVE DIAGNOSES: 1. Atherosclerosis of extremities with lifestyle-limiting claudication. 2. Previous aortomesenteric bypass.  POSTOPERATIVE DIAGNOSES: 1. Atherosclerosis of extremities with lifestyle-limiting claudication. 2. Previous aortomesenteric bypass.  PROCEDURES: 1. Revascularization of right common iliac artery (10 x 29 and 10 x 39 mm Omnilink bare-metal balloon mounted stent). 2. Revascularization of right external iliac artery (8 mm standard balloon angioplasty). 3. Revascularization of left external iliac artery (8 mm standard balloon angioplasty). 4. Mynx closure of bilateral common femoral arteries.   Echocardiogram: 05/02/2020 IMPRESSIONS    1. Left ventricular ejection fraction, by estimation, is 60 to 65%. The  left ventricle has normal function. Left ventricular endocardial border  not optimally defined to evaluate regional wall motion. The left  ventricular internal cavity size was mildly  dilated. There is mild left ventricular hypertrophy. Left ventricular  diastolic parameters are indeterminate.  2. Right ventricular systolic function is normal. The right ventricular  size is normal.  3. The mitral valve is abnormal. Trivial mitral valve regurgitation.  4. AV is thickened, calcified with mildly restricted motion. Peak and  mean gradients through the valve are 31 and 18 mm Hg respectively. . The  aortic valve is abnormal. Aortic valve regurgitation is not visualized.    Laboratory Data:  Chemistry Recent Labs  Lab 05/03/20 0445 05/03/20 2133 05/04/20 0407  NA 140 139 141  K 3.9 4.0 4.2  CL  109 107 108  CO2 22 19* 19*  GLUCOSE 121* 283* 134*  BUN 27* 25* 26*  CREATININE 1.34* 1.59* 1.56*  CALCIUM 8.5* 8.4* 8.5*  GFRNONAA 51* 42* 43*  GFRAA 60* 48* 50*  ANIONGAP _0 Recent Labs  Lab 05/01/20 1702 05/03/20 0445 05/03/20 2133  PROT 7.2  --  7.0  ALBUMIN 3.7 3.1* 3.5  AST 23  --  24  ALT 20  --  18  ALKPHOS 60  --  70  BILITOT 1.0  --  1.3*   Hematology Recent Labs  Lab 05/03/20 0445 05/03/20 2133 05/04/20 0407  WBC 4.8 8.9 9.2  RBC 2.46* 3.55* 3.07*  HGB 7.5* 10.7* 9.2*  HCT 24.4* 34.6* 31.8*  MCV 99.2 97.5 103.6*  MCH 30.5 30.1 30.0  MCHC 30.7 30.9 28.9*  RDW 15.5 16.1* 16.4*  PLT 172 266 177   Cardiac EnzymesNo results for input(s): TROPONINI in the last 168 hours. No results for input(s): TROPIPOC in the last 168 hours.  BNP Recent Labs  Lab 05/03/20 2133  BNP 1,082.0*    DDimer  Recent Labs  Lab 05/03/20 2133  DDIMER 3.83*    Radiology/Studies:  DG Chest 2 View  Result Date: 05/03/2020 CLINICAL DATA:  Shortness of breath EXAM: CHEST - 2 VIEW COMPARISON:  03/15/2014 FINDINGS: Cardiac shadow is mildly prominent. Aortic calcifications are noted. Pleural calcifications are seen in  the apices. No focal infiltrate or sizable effusion is seen. Degenerative changes of the thoracic spine are noted. Old left clavicular fracture with healing is seen. IMPRESSION: No acute abnormality noted. Electronically Signed   By: Inez Catalina M.D.   On: 05/03/2020 13:33   CT Head Wo Contrast  Result Date: 05/01/2020 CLINICAL DATA:  Left side hemi anopsia, left arm paresthesias. EXAM: CT HEAD WITHOUT CONTRAST TECHNIQUE: Contiguous axial images were obtained from the base of the skull through the vertex without intravenous contrast. COMPARISON:  None. FINDINGS: Brain: Area of low-density noted in the right occipital lobe compatible with acute infarction. No other areas of infarct. Diffuse cerebral atrophy. No hydrocephalus. No hemorrhage. Vascular: No hyperdense  vessel or unexpected calcification. Skull: No acute calvarial abnormality. Sinuses/Orbits: Visualized paranasal sinuses and mastoids clear. Orbital soft tissues unremarkable. Other: None IMPRESSION: Acute right occipital infarct. Electronically Signed   By: Rolm Baptise M.D.   On: 05/01/2020 17:24   MR ANGIO HEAD WO CONTRAST  Addendum Date: 05/02/2020   ADDENDUM REPORT: 05/02/2020 11:48 ADDENDUM: These results were called by telephone at the time of interpretation on 05/02/2020 at 11:47 am to provider Dr. Kathie Dike , who verbally acknowledged these results. Electronically Signed   By: Primitivo Gauze M.D.   On: 05/02/2020 11:48   Result Date: 05/02/2020 CLINICAL DATA:  Neuro deficit. EXAM: MR HEAD WITHOUT CONTRAST MR CIRCLE OF WILLIS WITHOUT CONTRAST MRA OF THE NECK WITHOUT CONTRAST TECHNIQUE: Multiplanar, multiecho pulse sequences of the brain, circle of willis and surrounding structures were obtained without intravenous contrast. Angiographic images of the neck were obtained using MRA technique without intravenous contrast. COMPARISON:  05/01/2020 head CT. FINDINGS: MR HEAD FINDINGS Brain: Acute right occipital infarct. No intracranial hemorrhage. No midline shift, ventriculomegaly or extra-axial fluid collection. No mass lesion. Vascular: Please see MRA head. Skull and upper cervical spine: Normal marrow signal. Sinuses/Orbits: Normal orbits. Clear paranasal sinuses. Trace bilateral mastoid effusions. Other: None. MR CIRCLE OF WILLIS FINDINGS Image quality is degraded by motion artifact. Anterior circulation: Left A1 segment hypoplasia. Mild left A1 segment irregularity likely reflects atheromatous disease. Mild right and moderate to severe narrowing of the distal left ICA cervical segments. Vessel wall irregularity involving the petrous and intracavernous ICAs likely reflects atheromatous disease versus motion artifact. Short segment moderate to severe narrowing involving the proximal right and distal  bilateral intra cavernous ICAs (1022:9). The bilateral intracavernous ICAs demonstrates short segment, mild pre-stenotic dilatation (1022: 8). No vascular malformation. Posterior circulation: Dominant left vertebral artery. Diminutive appearance of the right V4 segment after PICA takeoff. Atheromatous disease. No high-grade narrowing, occlusion or aneurysm involving the bilateral PCAs. Small focal outpouching involving the right PCA reflects an infundibulum. No significant stenosis, aneurysm, or vascular malformation. Venous sinuses: No evidence of thrombosis. Anatomic variants: Bilateral PCOM hypoplasia. MRA NECK FINDINGS Image quality is degraded by motion artifact. Three-vessel aortic arch. There is no high-grade narrowing or focal aneurysm involving the bilateral common or right internal carotid artery. Approximately 40% luminal narrowing of the proximal left ICA secondary to atheromatous disease. Diminutive appearance of the right vertebral artery. Proximal to the level of the glottis the right vertebral artery is not visualized, concerning for proximal occlusion. The left vertebral artery is patent and demonstrates antegrade flow. No evidence of high-grade narrowing or focal aneurysm involving left vertebral artery. Dominant left vertebral artery. IMPRESSION: 1. Acute right occipital infarct. 2. Nonvisualization of the proximal right vertebral artery inferior to the level of the glottis, concerning for occlusion. 3. Atheromatous  disease with multifocal intracranial stenoses most notably is follows. 4. Short-segment moderate to severe proximal right and distal bilateral intracavernous ICA narrowing. Both of the intracavernous ICAs demonstrate mild short-segment pre stenotic dilatation. 5. Moderate to severe short segment narrowing of the distal left ICA cervical segment. 6. Approximately 40% luminal narrowing of the proximal left ICA, distal to the bifurcation. 7. Please note that motion artifact on MRA head  and neck imaging limits evaluation. Electronically Signed: By: Primitivo Gauze M.D. On: 05/02/2020 11:41   MR ANGIO NECK WO CONTRAST  Addendum Date: 05/02/2020   ADDENDUM REPORT: 05/02/2020 11:48 ADDENDUM: These results were called by telephone at the time of interpretation on 05/02/2020 at 11:47 am to provider Dr. Kathie Dike , who verbally acknowledged these results. Electronically Signed   By: Primitivo Gauze M.D.   On: 05/02/2020 11:48   Result Date: 05/02/2020 CLINICAL DATA:  Neuro deficit. EXAM: MR HEAD WITHOUT CONTRAST MR CIRCLE OF WILLIS WITHOUT CONTRAST MRA OF THE NECK WITHOUT CONTRAST TECHNIQUE: Multiplanar, multiecho pulse sequences of the brain, circle of willis and surrounding structures were obtained without intravenous contrast. Angiographic images of the neck were obtained using MRA technique without intravenous contrast. COMPARISON:  05/01/2020 head CT. FINDINGS: MR HEAD FINDINGS Brain: Acute right occipital infarct. No intracranial hemorrhage. No midline shift, ventriculomegaly or extra-axial fluid collection. No mass lesion. Vascular: Please see MRA head. Skull and upper cervical spine: Normal marrow signal. Sinuses/Orbits: Normal orbits. Clear paranasal sinuses. Trace bilateral mastoid effusions. Other: None. MR CIRCLE OF WILLIS FINDINGS Image quality is degraded by motion artifact. Anterior circulation: Left A1 segment hypoplasia. Mild left A1 segment irregularity likely reflects atheromatous disease. Mild right and moderate to severe narrowing of the distal left ICA cervical segments. Vessel wall irregularity involving the petrous and intracavernous ICAs likely reflects atheromatous disease versus motion artifact. Short segment moderate to severe narrowing involving the proximal right and distal bilateral intra cavernous ICAs (1022:9). The bilateral intracavernous ICAs demonstrates short segment, mild pre-stenotic dilatation (1022: 8). No vascular malformation. Posterior  circulation: Dominant left vertebral artery. Diminutive appearance of the right V4 segment after PICA takeoff. Atheromatous disease. No high-grade narrowing, occlusion or aneurysm involving the bilateral PCAs. Small focal outpouching involving the right PCA reflects an infundibulum. No significant stenosis, aneurysm, or vascular malformation. Venous sinuses: No evidence of thrombosis. Anatomic variants: Bilateral PCOM hypoplasia. MRA NECK FINDINGS Image quality is degraded by motion artifact. Three-vessel aortic arch. There is no high-grade narrowing or focal aneurysm involving the bilateral common or right internal carotid artery. Approximately 40% luminal narrowing of the proximal left ICA secondary to atheromatous disease. Diminutive appearance of the right vertebral artery. Proximal to the level of the glottis the right vertebral artery is not visualized, concerning for proximal occlusion. The left vertebral artery is patent and demonstrates antegrade flow. No evidence of high-grade narrowing or focal aneurysm involving left vertebral artery. Dominant left vertebral artery. IMPRESSION: 1. Acute right occipital infarct. 2. Nonvisualization of the proximal right vertebral artery inferior to the level of the glottis, concerning for occlusion. 3. Atheromatous disease with multifocal intracranial stenoses most notably is follows. 4. Short-segment moderate to severe proximal right and distal bilateral intracavernous ICA narrowing. Both of the intracavernous ICAs demonstrate mild short-segment pre stenotic dilatation. 5. Moderate to severe short segment narrowing of the distal left ICA cervical segment. 6. Approximately 40% luminal narrowing of the proximal left ICA, distal to the bifurcation. 7. Please note that motion artifact on MRA head and neck imaging limits evaluation. Electronically Signed:  By: Primitivo Gauze M.D. On: 05/02/2020 11:41   MR BRAIN WO CONTRAST  Addendum Date: 05/02/2020   ADDENDUM REPORT:  05/02/2020 11:48 ADDENDUM: These results were called by telephone at the time of interpretation on 05/02/2020 at 11:47 am to provider Dr. Kathie Dike , who verbally acknowledged these results. Electronically Signed   By: Primitivo Gauze M.D.   On: 05/02/2020 11:48   Result Date: 05/02/2020 CLINICAL DATA:  Neuro deficit. EXAM: MR HEAD WITHOUT CONTRAST MR CIRCLE OF WILLIS WITHOUT CONTRAST MRA OF THE NECK WITHOUT CONTRAST TECHNIQUE: Multiplanar, multiecho pulse sequences of the brain, circle of willis and surrounding structures were obtained without intravenous contrast. Angiographic images of the neck were obtained using MRA technique without intravenous contrast. COMPARISON:  05/01/2020 head CT. FINDINGS: MR HEAD FINDINGS Brain: Acute right occipital infarct. No intracranial hemorrhage. No midline shift, ventriculomegaly or extra-axial fluid collection. No mass lesion. Vascular: Please see MRA head. Skull and upper cervical spine: Normal marrow signal. Sinuses/Orbits: Normal orbits. Clear paranasal sinuses. Trace bilateral mastoid effusions. Other: None. MR CIRCLE OF WILLIS FINDINGS Image quality is degraded by motion artifact. Anterior circulation: Left A1 segment hypoplasia. Mild left A1 segment irregularity likely reflects atheromatous disease. Mild right and moderate to severe narrowing of the distal left ICA cervical segments. Vessel wall irregularity involving the petrous and intracavernous ICAs likely reflects atheromatous disease versus motion artifact. Short segment moderate to severe narrowing involving the proximal right and distal bilateral intra cavernous ICAs (1022:9). The bilateral intracavernous ICAs demonstrates short segment, mild pre-stenotic dilatation (1022: 8). No vascular malformation. Posterior circulation: Dominant left vertebral artery. Diminutive appearance of the right V4 segment after PICA takeoff. Atheromatous disease. No high-grade narrowing, occlusion or aneurysm involving the  bilateral PCAs. Small focal outpouching involving the right PCA reflects an infundibulum. No significant stenosis, aneurysm, or vascular malformation. Venous sinuses: No evidence of thrombosis. Anatomic variants: Bilateral PCOM hypoplasia. MRA NECK FINDINGS Image quality is degraded by motion artifact. Three-vessel aortic arch. There is no high-grade narrowing or focal aneurysm involving the bilateral common or right internal carotid artery. Approximately 40% luminal narrowing of the proximal left ICA secondary to atheromatous disease. Diminutive appearance of the right vertebral artery. Proximal to the level of the glottis the right vertebral artery is not visualized, concerning for proximal occlusion. The left vertebral artery is patent and demonstrates antegrade flow. No evidence of high-grade narrowing or focal aneurysm involving left vertebral artery. Dominant left vertebral artery. IMPRESSION: 1. Acute right occipital infarct. 2. Nonvisualization of the proximal right vertebral artery inferior to the level of the glottis, concerning for occlusion. 3. Atheromatous disease with multifocal intracranial stenoses most notably is follows. 4. Short-segment moderate to severe proximal right and distal bilateral intracavernous ICA narrowing. Both of the intracavernous ICAs demonstrate mild short-segment pre stenotic dilatation. 5. Moderate to severe short segment narrowing of the distal left ICA cervical segment. 6. Approximately 40% luminal narrowing of the proximal left ICA, distal to the bifurcation. 7. Please note that motion artifact on MRA head and neck imaging limits evaluation. Electronically Signed: By: Primitivo Gauze M.D. On: 05/02/2020 11:41   DG CHEST PORT 1 VIEW  Result Date: 05/04/2020 CLINICAL DATA:  Respiratory failure. EXAM: PORTABLE CHEST 1 VIEW COMPARISON:  05/03/2020 FINDINGS: The endotracheal tube is 3.2 cm above the carina. The NG tube is coursing down the esophagus and into the stomach.  The heart is enlarged but stable. Stable tortuosity and calcification of the thoracic aorta. Increased density at the left lung base could suggest  atelectasis or infiltrate. Streaky right basilar atelectasis also noted. No pulmonary edema or pneumothorax. IMPRESSION: 1. Stable support apparatus. 2. Persistent increased left basilar density suggesting atelectasis or infiltrate. 3. Minimal right basilar atelectasis. Electronically Signed   By: Marijo Sanes M.D.   On: 05/04/2020 07:24   DG CHEST PORT 1 VIEW  Result Date: 05/03/2020 CLINICAL DATA:  Shortness of breath, intubated EXAM: PORTABLE CHEST 1 VIEW COMPARISON:  05/03/2020 FINDINGS: Endotracheal tube is 4 cm above the carina. NG tube enters the stomach. Bilateral perihilar airspace opacities and interstitial prominence concerning for edema. Heart is borderline in size. No visible effusions or acute bony abnormality. IMPRESSION: Bilateral perihilar opacities and diffuse interstitial prominence, likely edema/CHF. Electronically Signed   By: Rolm Baptise M.D.   On: 05/03/2020 21:29   Assessment & Plan    1. NSTEMI - Currently admitted with a CVA which occurred following a recent PV procedure on 04/27/2020. Also found to be anemic and received 1 unit pRBC's yesterday afternoon and developed acute hypoxic respiratory failure yesterday evening. Was febrile at that time and lactic acid elevated to 2.9. EKG showed a narrow-complex tachycardia with ST depression along the lateral leads. Initial HS Troponin 452 with repeat values of 1128 and 2324. BNP 1082. Blood cultures pending. CXR showing bilateral opacities and edema.  - From a cardiac perspective, he has been started on Heparin for anticoagulation. On Plavix given his recent PV procedure. Continue Atorvastatin 46m daily (LDL 54 this admission). Would restart BB therapy once BP stabilizes.  - Suspect a Type 2 NSTEMI in the setting of his CVA, volume overload and anemia but cannot rule out an acute event.  Given his recent CVA, he is not a candidate for a cardiac catheterization at this time. Would continue Heparin for 48 hours if Hgb remains stable. Will obtain a repeat limited echo for reassessment of EF and wall motion. Given his decreased breath sounds and CXR results, will dose IV Lasix [id number] again. Follow I&O's along with daily weights.   2. Questionable Paroxysmal Atrial Fibrillation - Was diagnosed with atrial fibrillation by his initial EKG but this shows p-waves along the rhythm strip. He did develop tachycardia yesterday evening which is more concerning for atrial fibrillation than his initial EKG. Will review tracings with Dr. BrHarl BowieIf not felt to represent atrial fibrillation, would recommend an outpatient 30-day monitor for further evaluation given his acute CVA.  - This patients CHA2DS2-VASc Score and unadjusted Ischemic Stroke Rate (% per year) is equal to 9.7 % stroke rate/year from a score of 6 (HTN, DM, Vascular, Age (2), CVA (2)). He is currently on Heparin for anticoagulation.   3. PVD - He is s/p prior mesenteric artery bypass and [facility]  - Was on ASA and Plavix prior to admission. ASA was stopped given the indication for anticoagulation and by review of notes this [facility]  4. HTN - BP has been variable from 88/41 - 163/69 within the past 24 hours. Stable at 106/45 on most recent check. PTA Hydralazine, HCTZ, Verapamil and Toprol-XL currently held.   5. Stage 3 CKD - Baseline creatinine 1.2 - 1.3. At 1.59 on 8/10 and stable at 1.56 today. Follow with diuresis.    6. Anemia - Hgb was 7.5 yesterday morning and he received 1 unit pRBC's. At 9.2 this AM.   7. Acute CVA - MRI Brain this admission confirmed an acute  right occipital infarct and there was nonvisualization of the proximal right vertebral artery inferior to the level of the glottis, concerning for  occlusion. - Further management per Neurology.    For questions or updates, please contact [place] Please consult [email or web address] for contact info under Cardiology/STEMI.  Signed, Erma Heritage, PA-C 05/04/2020, 9:42 AM Pager: [phone]  Attending note  Patient seen and discussed with PA Ahmed Prima, I agree with her documentation. 74 yo male history of PAD with prior bilateral iliac stents, DM2, HTN admitted with left eye vision loss. Admittted with CVA, CT showed acute right occipital infarct. Presented outside tpa window.   From cardiac standpoint new diagnosis of afib this admission. Subsequently converted back to SR, started on eliquis by primary team  Last night patient acutely hypoxic and SOB, patient became unresponsive with agonal breathing. Intubated. From physician notes subsequently febrile, started on sepsis/SIRS protocol but abx stopped after procalcitonin was normal.    ABG 7.2/45/143/20 Lactic acid 2.9-->1/5 BNP 1082 Mg 1.8 WBC 8.9 Hgb 10.7 Plt 266 Ddimer 3.83 K 4 Cr 1.59 BUN 25 procalcitonin <0.10 hstrop 452-->1128-->2324 CXR bilateral perihilar opacities and diffuse intersitial prominence, likely HF EKG SR, lateral TWIs on admission and f/u, anteroseptal Qwaves.    Afib, EKG somewhat difficult to distinguish but from tele review appears was in afib in the ER. Subsequently back in SR. He was started on eliquis this admission.   Acute hypoxic respirataroy failure with evidence of volume overload by CXR, BNP. EKG with lateral ST/T changes present on admission. Repeat echo is pending. High risk for CAD given his PAD history, certaintly with afib and CVA could have embolized to his coronaries as well.   Medical therapy at this time with atorva 20, plavix 75, hep gtt. Restart his ASA 85m daily, when back on DOAC would d/c aspirin. Soft bp's at times, avoid ACE and beta blocker. Agree with IV lasix today. With anemia, respiraotry failure, and very recent CVA not  candidate for immediate cath.    JCarlyle DollyMD

## 2020-05-05 ENCOUNTER — Inpatient Hospital Stay (HOSPITAL_COMMUNITY): Payer: Medicare Other

## 2020-05-05 DIAGNOSIS — Z978 Presence of other specified devices: Secondary | ICD-10-CM | POA: Diagnosis not present

## 2020-05-05 DIAGNOSIS — J9601 Acute respiratory failure with hypoxia: Secondary | ICD-10-CM | POA: Diagnosis not present

## 2020-05-05 DIAGNOSIS — I639 Cerebral infarction, unspecified: Secondary | ICD-10-CM | POA: Diagnosis not present

## 2020-05-05 DIAGNOSIS — R509 Fever, unspecified: Secondary | ICD-10-CM | POA: Diagnosis not present

## 2020-05-05 LAB — CBC
HCT: 29.6 % — ABNORMAL LOW (ref 39.0–52.0)
Hemoglobin: 9.2 g/dL — ABNORMAL LOW (ref 13.0–17.0)
MCH: 30.4 pg (ref 26.0–34.0)
MCHC: 31.1 g/dL (ref 30.0–36.0)
MCV: 97.7 fL (ref 80.0–100.0)
Platelets: 212 10*3/uL (ref 150–400)
RBC: 3.03 MIL/uL — ABNORMAL LOW (ref 4.22–5.81)
RDW: 16.3 % — ABNORMAL HIGH (ref 11.5–15.5)
WBC: 7 10*3/uL (ref 4.0–10.5)
nRBC: 0 % (ref 0.0–0.2)

## 2020-05-05 LAB — BRAIN NATRIURETIC PEPTIDE: B Natriuretic Peptide: 764 pg/mL — ABNORMAL HIGH (ref 0.0–100.0)

## 2020-05-05 LAB — COMPREHENSIVE METABOLIC PANEL
ALT: 19 U/L (ref 0–44)
AST: 41 U/L (ref 15–41)
Albumin: 3.2 g/dL — ABNORMAL LOW (ref 3.5–5.0)
Alkaline Phosphatase: 55 U/L (ref 38–126)
Anion gap: 10 (ref 5–15)
BUN: 25 mg/dL — ABNORMAL HIGH (ref 8–23)
CO2: 20 mmol/L — ABNORMAL LOW (ref 22–32)
Calcium: 8.4 mg/dL — ABNORMAL LOW (ref 8.9–10.3)
Chloride: 111 mmol/L (ref 98–111)
Creatinine, Ser: 1.51 mg/dL — ABNORMAL HIGH (ref 0.61–1.24)
Glucose, Bld: 143 mg/dL — ABNORMAL HIGH (ref 70–99)
Potassium: 4.2 mmol/L (ref 3.5–5.1)
Sodium: 141 mmol/L (ref 135–145)
Total Bilirubin: 1.4 mg/dL — ABNORMAL HIGH (ref 0.3–1.2)
Total Protein: 7 g/dL (ref 6.5–8.1)

## 2020-05-05 LAB — APTT
aPTT: 53 seconds — ABNORMAL HIGH (ref 24–36)
aPTT: 66 seconds — ABNORMAL HIGH (ref 24–36)
aPTT: 42 seconds — ABNORMAL HIGH (ref 24–36)

## 2020-05-05 LAB — HEMOGLOBIN AND HEMATOCRIT, BLOOD
HCT: 33.9 % — ABNORMAL LOW (ref 39.0–52.0)
Hemoglobin: 10.2 g/dL — ABNORMAL LOW (ref 13.0–17.0)

## 2020-05-05 LAB — GLUCOSE, CAPILLARY
Glucose-Capillary: 139 mg/dL — ABNORMAL HIGH (ref 70–99)
Glucose-Capillary: 130 mg/dL — ABNORMAL HIGH (ref 70–99)
Glucose-Capillary: 142 mg/dL — ABNORMAL HIGH (ref 70–99)
Glucose-Capillary: 122 mg/dL — ABNORMAL HIGH (ref 70–99)
Glucose-Capillary: 129 mg/dL — ABNORMAL HIGH (ref 70–99)

## 2020-05-05 LAB — HEPARIN LEVEL (UNFRACTIONATED): Heparin Unfractionated: 1.18 IU/mL — ABNORMAL HIGH (ref 0.30–0.70)

## 2020-05-05 LAB — SEDIMENTATION RATE: Sed Rate: 96 mm/hr — ABNORMAL HIGH (ref 0–16)

## 2020-05-05 LAB — TROPONIN I (HIGH SENSITIVITY)
Troponin I (High Sensitivity): 2354 ng/L (ref <18)
Troponin I (High Sensitivity): 540 ng/L (ref <18)

## 2020-05-05 LAB — PROCALCITONIN: Procalcitonin: 0.73 ng/mL

## 2020-05-05 LAB — MAGNESIUM: Magnesium: 1.8 mg/dL (ref 1.7–2.4)

## 2020-05-05 MED ORDER — FUROSEMIDE 10 MG/ML IJ SOLN
40.0000 mg | Freq: Two times a day (BID) | INTRAMUSCULAR | Status: AC
  Administered 2020-05-05 (×2): 40 mg via INTRAVENOUS
  Filled 2020-05-05 (×2): qty 4

## 2020-05-05 MED ORDER — METOPROLOL TARTRATE 25 MG PO TABS
25.0000 mg | ORAL_TABLET | Freq: Three times a day (TID) | ORAL | Status: DC
  Administered 2020-05-05 (×3): 25 mg via ORAL
  Filled 2020-05-05 (×3): qty 1

## 2020-05-05 MED ORDER — ATORVASTATIN CALCIUM 40 MG PO TABS
80.0000 mg | ORAL_TABLET | Freq: Every day | ORAL | Status: DC
  Administered 2020-05-05 – 2020-05-17 (×13): 80 mg via ORAL
  Filled 2020-05-05 (×14): qty 2

## 2020-05-05 NOTE — Progress Notes (Signed)
ANTICOAGULATION CONSULT NOTE -  Pharmacy Consult for apixaban to heparin  Indication: atrial fibrillation  No Known Allergies  Patient Measurements: Height: 5\' 8"  (172.7 cm) Weight: 103.4 kg (227 lb 15.3 oz) IBW/kg (Calculated) : 68.4 Heparin Dosing Weight: 91 kg  Vital Signs: Temp: 99.1 F (37.3 C) (08/12 0630) BP: 150/65 (08/12 0630) Pulse Rate: 99 (08/12 0630)  Labs: Recent Labs    05/03/20 2133 05/03/20 2333 05/04/20 0407 05/04/20 0407 05/04/20 0442 05/04/20 1622 05/04/20 1948 05/05/20 0319  HGB 10.7*   < > 9.2*   < >  --   --  9.7* 9.2*  HCT 34.6*   < > 31.8*  --   --   --  31.4* 29.6*  PLT 266  --  177  --   --   --   --  212  APTT 27  --   --   --   --  78*  --  53*  LABPROT 13.2  --   --   --   --   --   --   --   INR 1.0  --   --   --   --   --   --   --   HEPARINUNFRC <0.10*  --   --   --   --  1.28*  --  1.18*  CREATININE 1.59*  --  1.56*  --   --   --   --  1.51*  TROPONINIHS 452*   < >  --   --    < > 2,506* 2,499* 2,354*   < > = values in this interval not displayed.    Estimated Creatinine Clearance: 49.3 mL/min (A) (by C-G formula based on SCr of 1.51 mg/dL (H)).   Medical History: Past Medical History:  Diagnosis Date  . Arthritis   . DM type 2 (diabetes mellitus, type 2) (HCC)    no meds  . Gallstone   . Heart murmur    was told one time he had a murmur, not since 30 years ago  . HTN (hypertension)   . Hyperlipidemia   . Personal history of colonic polyp-adenoma 11/24/2013  . Renal insufficiency    mild  . Thoracic spine fracture (HCC)     Medications:  Medications Prior to Admission  Medication Sig Dispense Refill Last Dose  . allopurinol (ZYLOPRIM) 100 MG tablet TAKE 1 TABLET 2 TO 3 TIMES A DAY FOR GOUT 90 tablet 3 05/01/2020  . aspirin 81 MG tablet Take 81 mg by mouth daily.   05/01/2020 at 0700  . atorvastatin (LIPITOR) 20 MG tablet Take 1 tablet (20 mg total) by mouth daily. 90 tablet 3 05/01/2020  . clopidogrel (PLAVIX) 75 MG tablet  Take 75 mg by mouth daily.   05/01/2020 at 0700  . hydrALAZINE (APRESOLINE) 100 MG tablet Take 100 mg by mouth 3 (three) times daily.   05/01/2020  . hydrochlorothiazide (HYDRODIURIL) 25 MG tablet TAKE 1 OR 2 TABLETS DAILY IN THE MORNING AS NEEDED 180 tablet 3 05/01/2020  . hydrochlorothiazide (HYDRODIURIL) 25 MG tablet Take 12.5 mg by mouth daily.   05/01/2020  . loratadine (CLARITIN) 10 MG tablet Take 1 tablet (10 mg total) by mouth daily. 90 tablet 3 Past Week at Unknown time  . metFORMIN (GLUCOPHAGE-XR) 500 MG 24 hr tablet 2 tablet with evening meal Once a day 180 tablet 3 04/30/2020 at Unknown time  . metoprolol (TOPROL-XL) 200 MG 24 hr tablet Take 1 tablet (200 mg  total) by mouth daily. 90 tablet 3 05/01/2020 at 0700  . pioglitazone (ACTOS) 30 MG tablet Take 1 tablet (30 mg total) by mouth daily. 90 tablet 3 05/01/2020  . polyethylene glycol (MIRALAX / GLYCOLAX) packet Take 17 g by mouth daily. (Patient taking differently: Take 8.5 g by mouth daily. ) 30 packet 1 05/01/2020  . trandolapril (MAVIK) 4 MG tablet Take 1 tablet (4 mg total) by mouth daily. 90 tablet 3 05/01/2020  . verapamil (CALAN-SR) 240 MG CR tablet Take 2 tablets (480 mg total) by mouth daily. 180 tablet 3 05/01/2020  . hydrALAZINE (APRESOLINE) 50 MG tablet Take 1 tablet (50 mg total) by mouth 3 (three) times daily. 270 tablet 3     Assessment: Pharmacy consulted to transition patient from apixaban to heparin IV for afib. Patient received dose of apixaban on 8/10 @ 2000 so will have to dose based on APTT until correlation with heparin levels.  Heparin level- 1.18 still elevated from apixaban   APTT- 53 subtherapeutic   Goal of Therapy:  Heparin level 0.3-0.7 units/ml aPTT 66-102 seconds Monitor platelets by anticoagulation protocol: Yes   Plan:  Increase heparin infusion to 1550 units/hr Check anti-Xa level in 8 hours and daily while on heparin Continue to monitor H&H and platelets  Connor Bryant 05/05/2020,7:24 AM

## 2020-05-05 NOTE — Consult Note (Addendum)
NAME:  Connor Bryant, MRN:  [id number], DOB:  1946/01/21, LOS: 4 ADMISSION DATE:  05/01/2020, CONSULTATION DATE:  8/11 REFERRING MD:  Shah/ [facility], CHIEF COMPLAINT:  resp distress    Brief History   74 yowm quit smoking 10 y PTA   admitted with viz loss and R occipital cva as well as mod AS /anemia abruptly decompensated pm 8/10 and placed on vent and PCCM service asked to eval am 8/12   History of present illness   Per chart : Was started on Eliquis 56m BID on 05/03/2020. His Hgb was down to 7.5 and he reported dyspnea, therefore he received 1 unit pRBC's pm 8/10    Around 2000 8/10, he developed worsening dyspnea and saturations were at 52% on 4L [place]. Was tachycardiac with HR in the 130's to 140's. Initially on NRB and required intubation. Was febrile to 101.7. EKG showed a narrow-complex tachycardia with ST depression along the lateral leads. ABG showed pH 7.282 with pCO2 45.6. Lactic Acid 2.9. BNP 1082. Mg 1.8. CBC showing Hgb improved to 10.7. D-dimer 3.83. Initial HS Troponin 452 with repeat values of 1128 and 2324.  CXR showing bilateral opacities and edema.   Past Medical History  He,  has a past medical history of Arthritis, DM type 2 (diabetes mellitus, type 2) ([place], Gallstone, Heart murmur, HTN (hypertension), Hyperlipidemia, Personal history of colonic polyp-adenoma (11/24/2013), Renal insufficiency, and Thoracic spine fracture ([place].    Significant Hospital Events   Acute pulmonary edema p trx 8/10 pm > et   Consults:  Cards 8/11 PCCM 8/11   Procedures:    Significant Diagnostic Tests:  Echo 8/9  Mild LVE/ AS with mean gradient 18 and LA / RA nl size Echo 8/11 acute decreased EF to 35 %  Stress vs ischemic cm   Micro Data:  Covid 19  8/8 neg MRSA screen 8/10 neg  PCT  8/10 < 0.10   BC x 2  8/10 >>> ET 8/11  mixed organisms >>>  Antimicrobials:  Cefepime 8/10 >>> Vanc   IV  8/10  >>>  8/11    Scheduled Meds: . sodium chloride   Intravenous Once  . allopurinol   100 mg Oral BID  . aspirin EC  81 mg Oral Daily  . atorvastatin  80 mg Oral Daily  . chlorhexidine gluconate (MEDLINE KIT)  15 mL Mouth Rinse BID  . Chlorhexidine Gluconate Cloth  6 each Topical Daily  . clopidogrel  75 mg Oral Daily  . furosemide  40 mg Intravenous BID  . insulin aspart  0-5 Units Subcutaneous QHS  . insulin aspart  0-9 Units Subcutaneous TID WC  . ipratropium-albuterol  3 mL Nebulization Q6H  . mouth rinse  15 mL Mouth Rinse 10 times per day  . metoprolol tartrate  25 mg Oral TID  . pantoprazole (PROTONIX) IV  40 mg Intravenous Q24H  . polyethylene glycol  8.5 g Oral Daily   Continuous Infusions: . ceFEPime (MAXIPIME) IV Stopped (05/05/20 0926)  . heparin 1,550 Units/hr (05/05/20 0928)  . midazolam 4.5 mg/hr (05/05/20 0928)   PRN Meds:.acetaminophen **OR** acetaminophen, fentaNYL (SUBLIMAZE) injection, hydrALAZINE, metoprolol tartrate, midazolam   Interim history/subjective:  Sedated on vent and comfortable, nods approp/ follows commands bp noted a bit high given echo findings with pos trop  Objective   Blood pressure (!) 164/65, pulse (!) 102, temperature (!) 101.3 F (38.5 C), temperature source Bladder, resp. rate (!) 23, height 5' 8" (1.727 m), weight 103.4 kg, SpO2  90 %.    Vent Mode: PRVC FiO2 (%):  [40 %-60 %] 40 % Set Rate:  [14 bmp] 14 bmp Vt Set:  [540 mL] 540 mL PEEP:  [5 cmH20] 5 cmH20 Pressure Support:  [8 cmH20] 8 cmH20 Plateau Pressure:  [15 cmH20-16 cmH20] 15 cmH20   Intake/Output Summary (Last 24 hours) at 05/05/2020 1705 Last data filed at 05/05/2020 1614 Gross per 24 hour  Intake 766.15 ml  Output 1600 ml  Net -833.85 ml   Filed Weights   05/01/20 1641 05/05/20 0457  Weight: 103.4 kg 103.4 kg    Examination: Tmax  102   Pt sedated on vent but still fc No jvd Oropharynx et  Neck supple Lungs with  Min  rhonchi bilaterally RRR   bp noted as above/  II- III/ VI sem  Abd soft/ min distended, no  excursion  Extr warm with no  edema or clubbing noted      I personally reviewed images and agree with radiology impression as follows:  CXR:   Portable 8/12 1. Well-positioned support structures. 2. Stable mild cardiomegaly and mild pulmonary edema. 3. Probable small bilateral pleural effusions, slightly increased on the right. 4. Mildly increased right basilar atelectasis.    Resolved Hospital Problem list      Assessment & Plan:  1 ) Acute resp distress/ pulmoary edema ? Related to trx with  in pt with Mod AS/ BNP > 1k during decompensation  - improved am 8/12 and fm wants ncb / not to reintubate but want to see him first which is reasonable given the multiple comorbidities and likelihood that higher WOB may precipitate more ischemia  - echo 8/11 reviewed, shows ? Stress vs ischemic cm    2) Prob copd with air trapping mild  - rx with duoneb  3)  Fever ? Aspiration ? HCAP   - abx approp, see flowsheet   - assoc with high ESR 8/12 and PCT trending up a bit >>> no change abx   4) Acute on chronic mild renal insff  = baseline 1.21 on 10/02/2018  On ACEi on admit Lab Results  Component Value Date   CREATININE 1.51 (H) 05/05/2020   CREATININE 1.56 (H) 05/04/2020   CREATININE 1.59 (H) 05/03/2020   monitor uop/ avoid nephrotoxins (vanc d/c'd)  - note hematuria am 8/12 on hep drip    5) anemia s/p  one unit prbc 8/10    Lab Results  Component Value Date   HGB 10.2 (L) 05/05/2020   HGB 9.2 (L) 05/05/2020   HGB 9.7 (L) 05/04/2020   HGB 11.4 (L) 02/29/2020   HGB 11.2 (L) 02/25/2019   HGB 11.8 (L) 08/26/2018     Best practice:  Diet: npo Pain/Anxiety/Delirium protocol (if indicated): versed drip VAP protocol (if indicated):  DVT prophylaxis: hep IV  GI prophylaxis: ppi IV  Glucose control:  SSI  Mobility: bed rest Code Status: Full code  Family Communication: per [facility]ad Disposition: ICU   Labs   CBC: Recent Labs  Lab 05/01/20 1702 05/01/20 1702 05/02/20 0400 05/02/20 0400 05/03/20 0445  05/03/20 0445 05/03/20 2133 05/04/20 0407 05/04/20 1948 05/05/20 0319 05/05/20 0752  WBC 7.8   < > 5.2  --  4.8  --  8.9 9.2  --  7.0  --   NEUTROABS 5.9  --   --   --   --   --   --   --   --   --   --  HGB 8.6*   < > 8.0*   < > 7.5*   < > 10.7* 9.2* 9.7* 9.2* 10.2*  HCT 26.5*   < > 25.2*   < > 24.4*   < > 34.6* 31.8* 31.4* 29.6* 33.9*  MCV 96.4   < > 97.7  --  99.2  --  97.5 103.6*  --  97.7  --   PLT 211   < > 176  --  172  --  266 177  --  212  --    < > = values in this interval not displayed.    Basic Metabolic Panel: Recent Labs  Lab 05/01/20 0502 05/01/20 1702 05/02/20 0400 05/03/20 0445 05/03/20 2133 05/04/20 0407 05/05/20 0319  NA  --    < > 138 140 139 141 141  K  --    < > 3.6 3.9 4.0 4.2 4.2  CL  --    < > 106 109 107 108 111  CO2  --    < > 21* 22 19* 19* 20*  GLUCOSE  --    < > 122* 121* 283* 134* 143*  BUN  --    < > 36* 27* 25* 26* 25*  CREATININE  --    < > 1.54* 1.34* 1.59* 1.56* 1.51*  CALCIUM  --    < > 8.8* 8.5* 8.4* 8.5* 8.4*  MG 1.8  --   --   --  1.8  --  1.8  PHOS  --   --   --  3.7 6.4*  --   --    < > = values in this interval not displayed.   GFR: Estimated Creatinine Clearance: 49.3 mL/min (A) (by C-G formula based on SCr of 1.51 mg/dL (H)). Recent Labs  Lab 05/03/20 0445 05/03/20 2132 05/03/20 2133 05/03/20 2321 05/04/20 0407 05/05/20 0319 05/05/20 0839  PROCALCITON  --   --  <0.10  --   --   --  0.73  WBC 4.8  --  8.9  --  9.2 7.0  --   LATICACIDVEN  --  2.9*  --  1.5  --   --   --     Liver Function Tests: Recent Labs  Lab 05/01/20 1702 05/03/20 0445 05/03/20 2133 05/05/20 0319  AST 23  --  24 41  ALT 20  --  18 19  ALKPHOS 60  --  70 55  BILITOT 1.0  --  1.3* 1.4*  PROT 7.2  --  7.0 7.0  ALBUMIN 3.7 3.1* 3.5 3.2*   No results for input(s): LIPASE, AMYLASE in the last 168 hours. No results for input(s): AMMONIA in the last 168 hours.  ABG    Component Value Date/Time   PHART 7.375 05/04/2020 0530   PCO2ART  37.3 05/04/2020 0530   PO2ART 99.0 05/04/2020 0530   HCO3 22.0 05/04/2020 0530   TCO2 26 03/11/2014 0432   ACIDBASEDEF 3.1 (H) 05/04/2020 0530   O2SAT 95.5 05/04/2020 0530     Coagulation Profile: Recent Labs  Lab 05/01/20 1702 05/03/20 2133  INR 1.1 1.0    Cardiac Enzymes: No results for input(s): CKTOTAL, CKMB, CKMBINDEX, TROPONINI in the last 168 hours.  HbA1C: HB A1C (BAYER DCA - WAIVED)  Date/Time Value Ref Range Status  02/29/2020 02:56 PM 6.1 <7.0 % Final    Comment:  Diabetic Adult            <7.0                                       Healthy Adult        4.3 - 5.7                                                           (DCCT/NGSP) American Diabetes Association's Summary of Glycemic Recommendations for Adults with Diabetes: Hemoglobin A1c <7.0%. More stringent glycemic goals (A1c <6.0%) may further reduce complications at the cost of increased risk of hypoglycemia.   08/27/2019 01:56 PM 6.3 <7.0 % Final    Comment:                                          Diabetic Adult            <7.0                                       Healthy Adult        4.3 - 5.7                                                           (DCCT/NGSP) American Diabetes Association's Summary of Glycemic Recommendations for Adults with Diabetes: Hemoglobin A1c <7.0%. More stringent glycemic goals (A1c <6.0%) may further reduce complications at the cost of increased risk of hypoglycemia.    Hgb A1c MFr Bld  Date/Time Value Ref Range Status  05/02/2020 04:00 AM 6.2 (H) 4.8 - 5.6 % Final    Comment:    (NOTE) Pre diabetes:          5.7%-6.4%  Diabetes:              >6.4%  Glycemic control for   <7.0% adults with diabetes     CBG: Recent Labs  Lab 05/04/20 2332 05/05/20 0337 05/05/20 0753 05/05/20 1120 05/05/20 1639  GLUCAP 129* 139* 130* 142* 122*    The patient is critically ill with multiple organ systems failure and requires high  complexity decision making for assessment and support, frequent evaluation and titration of therapies, application of advanced monitoring technologies and extensive interpretation of multiple databases. Critical Care Time devoted to patient care services described in this note is 35 minutes.    Christinia Gully, MD Pulmonary and [place] [phone]   After 7:00 pm call Elink  [phone]

## 2020-05-05 NOTE — Progress Notes (Signed)
ANTICOAGULATION CONSULT NOTE -  Pharmacy Consult for apixaban to heparin  Indication: atrial fibrillation  No Known Allergies  Patient Measurements: Height: 5\' 8"  (172.7 cm) Weight: 103.4 kg (227 lb 15.3 oz) IBW/kg (Calculated) : 68.4 Heparin Dosing Weight: 91 kg  Vital Signs: Temp: 101.3 F (38.5 C) (08/12 1600) Temp Source: Bladder (08/12 1600) BP: 164/65 (08/12 1500) Pulse Rate: 102 (08/12 1500)  Labs: Recent Labs    05/03/20 2133 05/03/20 2333 05/04/20 0407 05/04/20 0407 05/04/20 0442 05/04/20 1622 05/04/20 1948 05/04/20 1948 05/05/20 0319 05/05/20 0752 05/05/20 1453  HGB 10.7*   < > 9.2*   < >  --   --  9.7*   < > 9.2* 10.2*  --   HCT 34.6*   < > 31.8*   < >  --   --  31.4*  --  29.6* 33.9*  --   PLT 266  --  177  --   --   --   --   --  212  --   --   APTT 27   < >  --   --   --  78*  --   --  53*  --  66*  LABPROT 13.2  --   --   --   --   --   --   --   --   --   --   INR 1.0  --   --   --   --   --   --   --   --   --   --   HEPARINUNFRC <0.10*  --   --   --   --  1.28*  --   --  1.18*  --   --   CREATININE 1.59*  --  1.56*  --   --   --   --   --  1.51*  --   --   TROPONINIHS 452*   < >  --    < >   < > 2,506* 2,499*  --  2,354* 540*  --    < > = values in this interval not displayed.    Estimated Creatinine Clearance: 49.3 mL/min (A) (by C-G formula based on SCr of 1.51 mg/dL (H)).   Medical History: Past Medical History:  Diagnosis Date  . Arthritis   . DM type 2 (diabetes mellitus, type 2) (HCC)    no meds  . Gallstone   . Heart murmur    was told one time he had a murmur, not since 30 years ago  . HTN (hypertension)   . Hyperlipidemia   . Personal history of colonic polyp-adenoma 11/24/2013  . Renal insufficiency    mild  . Thoracic spine fracture (HCC)     Medications:  Medications Prior to Admission  Medication Sig Dispense Refill Last Dose  . allopurinol (ZYLOPRIM) 100 MG tablet TAKE 1 TABLET 2 TO 3 TIMES A DAY FOR GOUT 90 tablet 3  05/01/2020  . aspirin 81 MG tablet Take 81 mg by mouth daily.   05/01/2020 at 0700  . atorvastatin (LIPITOR) 20 MG tablet Take 1 tablet (20 mg total) by mouth daily. 90 tablet 3 05/01/2020  . clopidogrel (PLAVIX) 75 MG tablet Take 75 mg by mouth daily.   05/01/2020 at 0700  . hydrALAZINE (APRESOLINE) 100 MG tablet Take 100 mg by mouth 3 (three) times daily.   05/01/2020  . hydrochlorothiazide (HYDRODIURIL) 25 MG tablet TAKE 1 OR 2 TABLETS  DAILY IN THE MORNING AS NEEDED 180 tablet 3 05/01/2020  . hydrochlorothiazide (HYDRODIURIL) 25 MG tablet Take 12.5 mg by mouth daily.   05/01/2020  . loratadine (CLARITIN) 10 MG tablet Take 1 tablet (10 mg total) by mouth daily. 90 tablet 3 Past Week at Unknown time  . metFORMIN (GLUCOPHAGE-XR) 500 MG 24 hr tablet 2 tablet with evening meal Once a day 180 tablet 3 04/30/2020 at Unknown time  . metoprolol (TOPROL-XL) 200 MG 24 hr tablet Take 1 tablet (200 mg total) by mouth daily. 90 tablet 3 05/01/2020 at 0700  . pioglitazone (ACTOS) 30 MG tablet Take 1 tablet (30 mg total) by mouth daily. 90 tablet 3 05/01/2020  . polyethylene glycol (MIRALAX / GLYCOLAX) packet Take 17 g by mouth daily. (Patient taking differently: Take 8.5 g by mouth daily. ) 30 packet 1 05/01/2020  . trandolapril (MAVIK) 4 MG tablet Take 1 tablet (4 mg total) by mouth daily. 90 tablet 3 05/01/2020  . verapamil (CALAN-SR) 240 MG CR tablet Take 2 tablets (480 mg total) by mouth daily. 180 tablet 3 05/01/2020  . hydrALAZINE (APRESOLINE) 50 MG tablet Take 1 tablet (50 mg total) by mouth 3 (three) times daily. 270 tablet 3     Assessment: Pharmacy consulted to transition patient from apixaban to heparin IV for afib. Patient received dose of apixaban on 8/10 @ 2000 so will have to dose based on APTT until correlation with heparin levels.  Heparin level- 1.18 still elevated from apixaban   APTT- 66 therapeutic   Goal of Therapy:  Heparin level 0.3-0.7 units/ml aPTT 66-102 seconds Monitor platelets by anticoagulation  protocol: Yes   Plan:  Continue heparin infusion at 1550 units/hr Check anti-Xa level in 8 hours and daily while on heparin Continue to monitor H&H and platelets  Connor Bryant 05/05/2020,4:55 PM

## 2020-05-05 NOTE — Progress Notes (Signed)
Hgb 9.2 > 9.7 > 9.2  Hemoglobin remained stable despite being on IV heparin drip.  No indication for stopping the drip at this time.

## 2020-05-05 NOTE — Progress Notes (Addendum)
Notified regarding ongoing fever along with hematuria and R sided pelvic/groin/scrotal ecchymosis. Plan to repeat blood cultures and apply cooling blanket for fever. Continue cefepime as ordered. MRSA nares negative from 8/10. Plan to check renal U/S given persistent hematuria. Finally, will discontinue heparin drip altogether for now given the eccymosis that could be related to an arterial bleed from his most recent stent placement at [facility]. Will apply pressure to the area along with ice pack. Will reassess in am at bedside and repeat h/h to see if asa and plavix will need to be held as well.  Close monitoring over the course of the evening to continue.

## 2020-05-05 NOTE — Progress Notes (Signed)
Patient back in afib rhythm with HR sustaining between 130 and 150. PO metoprolol given as scheduled, PRN IV metoprolol provided as well with no effect. Midlevel aware.

## 2020-05-05 NOTE — Progress Notes (Signed)
Progress Note  Patient Name: RUMALDO DIFATTA Date of Encounter: 05/05/2020  [facility] Cardiologist: New  Subjective   Issues with afib with RVR yesterday evening.   Inpatient Medications    Scheduled Meds: . sodium chloride   Intravenous Once  . allopurinol  100 mg Oral BID  . aspirin EC  81 mg Oral Daily  . atorvastatin  20 mg Oral Daily  . chlorhexidine gluconate (MEDLINE KIT)  15 mL Mouth Rinse BID  . Chlorhexidine Gluconate Cloth  6 each Topical Daily  . clopidogrel  75 mg Oral Daily  . insulin aspart  0-5 Units Subcutaneous QHS  . insulin aspart  0-9 Units Subcutaneous TID WC  . ipratropium-albuterol  3 mL Nebulization Q6H  . mouth rinse  15 mL Mouth Rinse 10 times per day  . pantoprazole (PROTONIX) IV  40 mg Intravenous Q24H  . polyethylene glycol  8.5 g Oral Daily   Continuous Infusions: . ceFEPime (MAXIPIME) IV Stopped (05/04/20 2148)  . diltiazem (CARDIZEM) infusion Stopped (05/05/20 0743)  . heparin Stopped (05/05/20 0743)  . midazolam Stopped (05/05/20 0743)   PRN Meds: acetaminophen **OR** acetaminophen, fentaNYL (SUBLIMAZE) injection, hydrALAZINE, metoprolol tartrate, midazolam   Vital Signs    Vitals:   05/05/20 0630 05/05/20 0700 05/05/20 0800 05/05/20 0815  BP: (!) 150/65 (!) 165/63 (!) 175/73 (!) 166/71  Pulse: 99 98 (!) 102 (!) 106  Resp: (!) 23 19 (!) 23 18  Temp: 99.1 F (37.3 C) 99.3 F (37.4 C) 99.5 F (37.5 C) 99.5 F (37.5 C)  TempSrc:      SpO2: 97% 96% 97% 99%  Weight:      Height:        Intake/Output Summary (Last 24 hours) at 05/05/2020 0839 Last data filed at 05/05/2020 0754 Gross per 24 hour  Intake 668.66 ml  Output 2575 ml  Net -1906.34 ml   Last 3 Weights 05/05/2020 05/01/2020 02/29/2020  Weight (lbs) 227 lb 15.3 oz 228 lb 227 lb  Weight (kg) 103.4 kg 103.42 kg 102.967 kg      Telemetry    Afib with RVR yesterday evening. Today sinus tach 110 - Personally Reviewed  ECG    SR, lateral TWIs - Personally  Reviewed  Physical Exam   GEN: No acute distress.   Neck: No JVD Cardiac: RRR. 2/6 systolic murmur rusb Respiratory: coarse bilaterally GI: Soft, nontender, non-distended  MS: No edema; No deformity. Neuro:  Nonfocal  Psych: sedated, cannot assess  Labs    High Sensitivity Troponin:   Recent Labs  Lab 05/03/20 2333 05/04/20 0442 05/04/20 1622 05/04/20 1948 05/05/20 0319  TROPONINIHS 1,128* 2,324* 2,506* 2,499* 2,354*      Chemistry Recent Labs  Lab 05/01/20 1702 05/02/20 0400 05/03/20 0445 05/03/20 0445 05/03/20 2133 05/04/20 0407 05/05/20 0319  NA 136   < > 140   < > 139 141 141  K 4.0   < > 3.9   < > 4.0 4.2 4.2  CL 104   < > 109   < > 107 108 111  CO2 18*   < > 22   < > 19* 19* 20*  GLUCOSE 174*   < > 121*   < > 283* 134* 143*  BUN 40*   < > 27*   < > 25* 26* 25*  CREATININE 1.79*   < > 1.34*   < > 1.59* 1.56* 1.51*  CALCIUM 9.1   < > 8.5*   < > 8.4* 8.5* 8.4*  PROT 7.2  --   --   --  7.0  --  7.0  ALBUMIN 3.7  --  3.1*  --  3.5  --  3.2*  AST 23  --   --   --  24  --  41  ALT 20  --   --   --  18  --  19  ALKPHOS 60  --   --   --  70  --  55  BILITOT 1.0  --   --   --  1.3*  --  1.4*  GFRNONAA 36*   < > 51*  --  42* 43*  --   GFRAA 42*   < > 60*  --  48* 50*  --   ANIONGAP 14   < > 9   < > 13 14 10.0   < > = values in this interval not displayed.     Hematology Recent Labs  Lab 05/03/20 2133 05/03/20 2133 05/04/20 0407 05/04/20 0407 05/04/20 1948 05/05/20 0319 05/05/20 0752  WBC 8.9  --  9.2  --   --  7.0  --   RBC 3.55*  --  3.07*  --   --  3.03*  --   HGB 10.7*   < > 9.2*   < > 9.7* 9.2* 10.2*  HCT 34.6*   < > 31.8*   < > 31.4* 29.6* 33.9*  MCV 97.5  --  103.6*  --   --  97.7  --   MCH 30.1  --  30.0  --   --  30.4  --   MCHC 30.9  --  28.9*  --   --  31.1  --   RDW 16.1*  --  16.4*  --   --  16.3*  --   PLT 266  --  177  --   --  212  --    < > = values in this interval not displayed.    BNP Recent Labs  Lab 05/03/20 2133  05/05/20 0319  BNP 1,082.0* 764.0*     DDimer  Recent Labs  Lab 05/03/20 2133  DDIMER 3.83*     Radiology    DG Chest 2 View  Result Date: 05/03/2020 CLINICAL DATA:  Shortness of breath EXAM: CHEST - 2 VIEW COMPARISON:  03/15/2014 FINDINGS: Cardiac shadow is mildly prominent. Aortic calcifications are noted. Pleural calcifications are seen in the apices. No focal infiltrate or sizable effusion is seen. Degenerative changes of the thoracic spine are noted. Old left clavicular fracture with healing is seen. IMPRESSION: No acute abnormality noted. Electronically Signed   By: Inez Catalina M.D.   On: 05/03/2020 13:33   DG CHEST PORT 1 VIEW  Result Date: 05/04/2020 CLINICAL DATA:  Respiratory failure. EXAM: PORTABLE CHEST 1 VIEW COMPARISON:  05/03/2020 FINDINGS: The endotracheal tube is 3.2 cm above the carina. The NG tube is coursing down the esophagus and into the stomach. The heart is enlarged but stable. Stable tortuosity and calcification of the thoracic aorta. Increased density at the left lung base could suggest atelectasis or infiltrate. Streaky right basilar atelectasis also noted. No pulmonary edema or pneumothorax. IMPRESSION: 1. Stable support apparatus. 2. Persistent increased left basilar density suggesting atelectasis or infiltrate. 3. Minimal right basilar atelectasis. Electronically Signed   By: Marijo Sanes M.D.   On: 05/04/2020 07:24   DG CHEST PORT 1 VIEW  Result Date: 05/03/2020 CLINICAL DATA:  Shortness of breath, intubated EXAM: PORTABLE CHEST  1 VIEW COMPARISON:  05/03/2020 FINDINGS: Endotracheal tube is 4 cm above the carina. NG tube enters the stomach. Bilateral perihilar airspace opacities and interstitial prominence concerning for edema. Heart is borderline in size. No visible effusions or acute bony abnormality. IMPRESSION: Bilateral perihilar opacities and diffuse interstitial prominence, likely edema/CHF. Electronically Signed   By: Rolm Baptise M.D.   On: 05/03/2020  21:29   ECHOCARDIOGRAM LIMITED  Result Date: 05/04/2020    ECHOCARDIOGRAM LIMITED REPORT   Patient Name:   KHRYSTIAN SCHAUF Date of Exam: 05/04/2020 Medical Rec #:  [id number]      Height:       68.0 in Accession #:    [id number]     Weight:       228.0 lb Date of Birth:  06-26-1946      BSA:          2.161 m Patient Age:    9 years       BP:           126/53 mmHg Patient Gender: M              HR:           108 bpm. Exam Location:  [facility] Procedure: Limited Echo and Intracardiac Opacification Agent Indications:    Elevated Troponin  History:        Patient has prior history of Echocardiogram examinations, most                 recent 05/02/2020. Stroke; Risk Factors:Diabetes, Hypertension,                 Dyslipidemia and Former Smoker. Elevated Troponin.  Sonographer:    Leavy Cella RDCS (AE) Referring Phys: [id number] [place]  1. The entire apex from mid to distal ventricle is akinetic. Pattern can be consistent with stress induced cardiomyopathy/Takotsubo CM, however cannot exclude ischemic etiology     . Left ventricular ejection fraction, by estimation, is 35%. The left ventricle has moderately decreased function.  2. The inferior vena cava is normal in size with greater than 50% respiratory variability, suggesting right atrial pressure of 3 mmHg. FINDINGS  Left Ventricle: The entire apex from mid to distal ventricle is akinetic. Pattern can be consistent with stress induced cardiomyopathy/Takotsubo CM, however cannot exclude ischemic etiology. Left ventricular ejection fraction, by estimation, is 35%. The left ventricle has moderately decreased function. Definity contrast agent was given IV to delineate the left ventricular endocardial borders. Pericardium: There is no evidence of pericardial effusion. Aortic Valve: Aortic valve mean gradient measures 12.0 mmHg. Aortic valve peak gradient measures 22.8 mmHg. Venous: The inferior vena cava is normal in size with greater than 50%  respiratory variability, suggesting right atrial pressure of 3 mmHg. LEFT VENTRICLE PLAX 2D LVIDd:         5.61 cm Diastology LVIDs:         4.50 cm LV e' lateral:   6.74 cm/s LV PW:         1.33 cm LV E/e' lateral: 20.0 LV IVS:        1.37 cm LV e' medial:    8.70 cm/s                        LV E/e' medial:  15.5  LEFT ATRIUM         Index LA diam:    4.50 cm 2.08 cm/m  AORTIC VALVE AV Vmax:  239.00 cm/s AV Vmean:          161.000 cm/s AV VTI:            0.382 m AV Peak Grad:      22.8 mmHg AV Mean Grad:      12.0 mmHg LVOT Vmax:         98.20 cm/s LVOT Vmean:        59.400 cm/s LVOT VTI:          0.152 m LVOT/AV VTI ratio: 0.40  AORTA Ao Root diam: 3.20 cm MITRAL VALVE MV Area (PHT): 7.37 cm     SHUNTS MV Decel Time: 103 msec     Systemic VTI: 0.15 m MV E velocity: 135.00 cm/s Carlyle Dolly MD Electronically signed by Carlyle Dolly MD Signature Date/Time: 05/04/2020/4:05:41 PM    Final     Cardiac Studies     Patient Profile     ELDRA WORD is a 74 y.o. male with past medical history of PVD (s/p prior mesenteric artery bypass and recent bilateral iliac stenting at [facility] on 04/27/2020), HTN, HLD, Type 2 DM and Stage 3 CKD who is being seen today for the evaluation of an NSTEMI at the request of Dr. Manuella Ghazi.   Assessment & Plan    1. NSTEMI - initially admitted with stroke - during admission acute onset SOB, hypoxia requiring intubation. Evidence of acute pulmonary edema.  - peak trop 2500 and trending down. EKG SR, lateral TWIs on admission and f/u, anteroseptal Qwaves - echo LVEF 35% (acute change from 05/02/20 echo) with apical akinesis.   - possible acute plaque rupture vs coronary embolic event given presented with new onset afib and stroke vs. Stress induced CM/Takotsubo CM given echo findings  - given recent stroke and respiratory failure on ventilator not candidate for emergent cath - medical therapy with ASA, plavix (had been on at home due to prior PAD intervention),  atorva 20 (increase to high dose 44m), hep gtt. Hemodynamically stable, actually hypertensive. Start oral lopressor 2109mtid. No ACE/ARB given renal dysfunction   2. Acute systolic HF - acute onset SOB during admission requring intubation, evidence of pulmonary edema - - echo LVEF 35% (acute change from 05/02/20 echo) with apical akinesis.   - possible acute plaque rupture vs coronary embolic event given presented with new onset afib and stroke vs. Stress induced CM/Takotsubo CM given echo findings - BNP1000--> 764, edema on CXR  - neg 1.7 L yesterday, neg [id number] admission. Received IV lasix x 1 yesterday, downtrending Cr. Redose IV lasix 57m86m2 today.  - start oral lopressor, can consolidate to long acting when no longer have to crush pills. NO ACE/ARB/ARNI given renal dysfunction   2. Afib - new diagnosis this admission, difficult to determine based on admit EKG. Telemetry reviewed from ER showing episodes of afib. He definitely had clear cut afib yeseterday evening for extended period - on hep gtt, convert to DOACLinevilleser to hospital discharge  - would avoid dilt gtt given low LVEF. If recurrent afib would start amiodarone with plans for short course.  - start oral lopressor 25mg37m with hold paramaters. Consolidate to long acting once pills don't have to be crushed   3. CVA - per primary team - Acute right occipital CVA with vision loss to left eye  4. Fever - abx per primary team   5. Hematuria - ?traumatic foley - stable Hgb, if downtrend would need to come off heparin.  For questions or updates, please contact [facility] Please consult [email or web address] for contact info under        Signed, Carlyle Dolly, MD  05/05/2020, 8:39 AM

## 2020-05-05 NOTE — Progress Notes (Signed)
EKG performed and Elink aware of results and patient's HR.

## 2020-05-05 NOTE — Progress Notes (Signed)
PROGRESS NOTE    HEZZIE KARIM  GDJ:[id number] DOB: 11/30/1945 DOA: 05/01/2020 PCP: Claretta Fraise, MD   Brief Narrative:  QQI:WLNLGX C Estesis a 74 y.o.malewith medical history significant forrecent iliac stents, diabetes mellitus, hypertension. Patient presented to the ED today with complaints of losing vision in his left eye,started yesterday.Patient woke up yesterday-Saturday morning with this, and this persisted today. Reports it feels like a dark shadow on the peripheral side ofhis left eye. He also reports some tingling in his left arm-this has resolved. No strokes. He denies weakness of his extremities. No change in speech, no facial asymmetry. Patient took his aspirin and Plavix and statins today. Patient had bilateral iliac stents,2 on the right, one on the left placed about 5 days ago at [facility].  The only cardiac history patient is aware of is a cardiac murmur. He is not aware of history of irregular heart rhythm.  ED Course:Blood pressure systolic 1 1 5-1 2 7, heart rate 49-59,hemoglobin 8.6,recent baseline about 11.Creatinine 1.7, about baseline. Head CT shows acute right occipital infarct. EKG showing atrial fibrillation. Telemetry neurology consulted-patient outside TPA window, stroke work-up.Dualantiplatelet for now. Hospitalist to admit for acute CVA.  8/11: Patient patient admitted with lateral vision loss to left eye with noted acute right-sided CVA.  Overnight he has developed acute hypoxemic respiratory failure and required intubation on mechanical ventilator.  He has been noted to have troponin elevation suggestive of NSTEMI and has been started on heparin drip.  Cardiology following as well as pulmonology for vent management.  There is suspicion he may have also aspirated with potential for HCAP.  8/12: Patient noted to have worsening LV dysfunction on 2D echocardiogram.  Discussed with family yesterday and elected for  patient to be DNR.  He was also noted to have atrial fibrillation with RVR overnight for which Cardizem was started.  Discontinued by cardiology today and started on metoprolol.  Planning to wean ventilator today if possible.  Continues on cefepime.  Continue Lasix for diuresis.  Assessment & Plan:   Principal Problem:   Acute CVA (cerebrovascular accident) [facility]) Active Problems:   Peripheral vascular disease ([place])   Gout   Essential hypertension   Type 2 diabetes mellitus without complication, without long-term current use of insulin (HCC)   Pure hypercholesterolemia   S/P insertion of iliac artery stent   Unspecified atrial fibrillation (HCC)   Elevated troponin   Fever   Positive D dimer   Acute respiratory failure with hypoxia ([place])   Endotracheally intubated   Acute hypoxemic respiratory failure status post intubation 8/10 -Likely with some component of pulmonary edema as well as aspiration with concern for HCAP -Given Lasix overnight, with Lasix continued per cardiology -DuoNeb for potential component of air trapping with probable COPD -Continue on cefepime for coverage of potential HCAP, vancomycin discontinued as MRSA negative -Continue ventilator management per pulmonology recommendations, try to wean today.  -D dimer elevated, currently on heparin drip. Will consider V/Q scan vs PE study if needed  NSTEMI -Heparin for anticoagulation -Continue on Plavix and aspirin with no further Eliquis at this time -Continue atorvastatin -Beta-blocker to resume once blood pressure stabilizes -Limited 2D echocardiogram with LVEF 35% and apical akinesis.  Not a candidate for emergent catheterization at this time.  May reflect stress-induced cardiomyopathy versus MI. -IV Lasix x 2 more doses today; 1.7 L negative fluid balance overnight -No need for immediate catheterization, continue to follow per cardiology with metoprolol initiated today -Troponin downward trending with  peak of  2500  Acute right occipital CVA with vision loss to left eye -LDL 54 -A1c 6.2% -He was started on Eliquis with recommendation to continue Plavix by his vascular surgeon Dr. Rosana Hoes at [facility] will need 30-day Holter monitoring in outpatient setting -Appreciate neurology recommendations  Stage III CKD with mild renal insufficiency -Continue to monitor with ongoing diuresis -Baseline creatinine 1.2-1.3 -Creatinine stable at 1.5  Probable paroxysmal atrial fibrillation -Likely to require outpatient 30-day monitor for further evaluation given acute CVA -Continue heparin for now for anticoagulation and hold Eliquis -Continue to monitor on telemetry and use beta-blocker for rate control as needed  Anemia-chronic iron deficiency anemia noted-stable -Improved after 1 unit PRBC transfusion -Plan to follow CBC -No overt bleeding identified  Hematuria -Continue to monitor while on heparin drip -Could be related to traumatic Foley insertion  Diabetes, non-insulin-dependent with nephropathy -Holding home oral Metformin and pioglitazone -Continue SSI with stable blood glucose readings noted  History of COPD-Long history of tobacco abuse -We will need outpatient pulmonology follow-up for PFTs -Continue on duo nebs every 6 hours  PVD -Status post mesenteric artery bypass with recent bilateral iliac stenting at [facility] 04/27/2020 -Plan to continue on Eliquis and Plavix outpatient and hold aspirin, per discussion with vascular surgeon at [facility]  Hypertension -Elevated this morning -Patient on metoprolol per cardiology -Further diuresis with IV Lasix ordered  DVT prophylaxis: Heparin drip Code Status: Full code Family Communication:  Tried calling son 8/12 with no response, will try again later Disposition Plan:  Status is: Inpatient  Remains inpatient appropriate because:Ongoing diagnostic testing needed not appropriate for outpatient work up, IV treatments  appropriate due to intensity of illness or inability to take PO and Inpatient level of care appropriate due to severity of illness   Dispo: The patient is from: Home  Anticipated d/c is to: SNF  Anticipated d/c date is: 3 days  Patient currently is not medically stable to d/c.  Consultants:   Cardiology  Neurology  Pulmonology  Procedures:   See below  Antimicrobials:  Anti-infectives (From admission, onward)   Start     Dose/Rate Route Frequency Ordered Stop   05/04/20 2200  vancomycin (VANCOREADY) IVPB 1250 mg/250 mL  Status:  Discontinued        1,250 mg 166.7 mL/hr over 90 Minutes Intravenous Every 24 hours 05/03/20 2335 05/04/20 0118   05/04/20 1400  ceFEPIme (MAXIPIME) 2 g in sodium chloride 0.9 % 100 mL IVPB     Discontinue     2 g 200 mL/hr over 30 Minutes Intravenous Every 12 hours 05/04/20 1348     05/04/20 1000  ceFEPIme (MAXIPIME) 2 g in sodium chloride 0.9 % 100 mL IVPB  Status:  Discontinued        2 g 200 mL/hr over 30 Minutes Intravenous Every 12 hours 05/03/20 2335 05/04/20 0942   05/03/20 2315  vancomycin (VANCOREADY) IVPB 2000 mg/400 mL        2,000 mg 200 mL/hr over 120 Minutes Intravenous  Once 05/03/20 2303 05/04/20 0153   05/03/20 2315  ceFEPIme (MAXIPIME) 2 g in sodium chloride 0.9 % 100 mL IVPB  Status:  Discontinued        2 g 200 mL/hr over 30 Minutes Intravenous  Once 05/03/20 2303 05/04/20 0118       Subjective: Patient seen and evaluated today with no new acute complaints or concerns. No acute concerns or events noted overnight.  Objective: Vitals:   05/05/20 0830 05/05/20  0845 05/05/20 0900 05/05/20 0915  BP: (!) 164/72 (!) 174/75 (!) 171/74 (!) 175/67  Pulse: (!) 106 (!) 113 (!) 112 (!) 116  Resp: (!) 25 (!) 24 (!) 25 20  Temp: 99.9 F (37.7 C) 100 F (37.8 C) 100.2 F (37.9 C) 100.2 F (37.9 C)  TempSrc:    Bladder  SpO2: 92% 92% 93% 90%  Weight:      Height:        Intake/Output  Summary (Last 24 hours) at 05/05/2020 1141 Last data filed at 05/05/2020 2800 Gross per 24 hour  Intake 948.58 ml  Output 2575 ml  Net -1626.42 ml   Filed Weights   05/01/20 1641 05/05/20 0457  Weight: 103.4 kg 103.4 kg    Examination:  General exam: Appears calm and comfortable  Respiratory system: Clear to auscultation. Respiratory effort normal. Cardiovascular system: S1 & S2 heard, RRR. No JVD, murmurs, rubs, gallops or clicks. No pedal edema. Gastrointestinal system: Abdomen is nondistended, soft and nontender. No organomegaly or masses felt. Normal bowel sounds heard. Central nervous system: Alert and oriented. No focal neurological deficits. Extremities: Symmetric 5 x 5 power. Skin: No rashes, lesions or ulcers Psychiatry: Judgement and insight appear normal. Mood & affect appropriate.     Data Reviewed: I have personally reviewed following labs and imaging studies  CBC: Recent Labs  Lab 05/01/20 1702 05/01/20 1702 05/02/20 0400 05/02/20 0400 05/03/20 0445 05/03/20 0445 05/03/20 2133 05/04/20 0407 05/04/20 1948 05/05/20 0319 05/05/20 0752  WBC 7.8   < > 5.2  --  4.8  --  8.9 9.2  --  7.0  --   NEUTROABS 5.9  --   --   --   --   --   --   --   --   --   --   HGB 8.6*   < > 8.0*   < > 7.5*   < > 10.7* 9.2* 9.7* 9.2* 10.2*  HCT 26.5*   < > 25.2*   < > 24.4*   < > 34.6* 31.8* 31.4* 29.6* 33.9*  MCV 96.4   < > 97.7  --  99.2  --  97.5 103.6*  --  97.7  --   PLT 211   < > 176  --  172  --  266 177  --  212  --    < > = values in this interval not displayed.   Basic Metabolic Panel: Recent Labs  Lab 05/01/20 0502 05/01/20 1702 05/02/20 0400 05/03/20 0445 05/03/20 2133 05/04/20 0407 05/05/20 0319  NA  --    < > 138 140 139 141 141  K  --    < > 3.6 3.9 4.0 4.2 4.2  CL  --    < > 106 109 107 108 111  CO2  --    < > 21* 22 19* 19* 20*  GLUCOSE  --    < > 122* 121* 283* 134* 143*  BUN  --    < > 36* 27* 25* 26* 25*  CREATININE  --    < > 1.54* 1.34* 1.59*  1.56* 1.51*  CALCIUM  --    < > 8.8* 8.5* 8.4* 8.5* 8.4*  MG 1.8  --   --   --  1.8  --  1.8  PHOS  --   --   --  3.7 6.4*  --   --    < > = values in this interval not displayed.  GFR: Estimated Creatinine Clearance: 49.3 mL/min (A) (by C-G formula based on SCr of 1.51 mg/dL (H)). Liver Function Tests: Recent Labs  Lab 05/01/20 1702 05/03/20 0445 05/03/20 2133 05/05/20 0319  AST 23  --  24 41  ALT 20  --  18 19  ALKPHOS 60  --  70 55  BILITOT 1.0  --  1.3* 1.4*  PROT 7.2  --  7.0 7.0  ALBUMIN 3.7 3.1* 3.5 3.2*   No results for input(s): LIPASE, AMYLASE in the last 168 hours. No results for input(s): AMMONIA in the last 168 hours. Coagulation Profile: Recent Labs  Lab 05/01/20 1702 05/03/20 2133  INR 1.1 1.0   Cardiac Enzymes: No results for input(s): CKTOTAL, CKMB, CKMBINDEX, TROPONINI in the last 168 hours. BNP (last 3 results) No results for input(s): PROBNP in the last 8760 hours. HbA1C: No results for input(s): HGBA1C in the last 72 hours. CBG: Recent Labs  Lab 05/04/20 2019 05/04/20 2332 05/05/20 0337 05/05/20 0753 05/05/20 1120  GLUCAP 124* 129* 139* 130* 142*   Lipid Profile: Recent Labs    05/03/20 2133  TRIG 109   Thyroid Function Tests: No results for input(s): TSH, T4TOTAL, FREET4, T3FREE, THYROIDAB in the last 72 hours. Anemia Panel: No results for input(s): VITAMINB12, FOLATE, FERRITIN, TIBC, IRON, RETICCTPCT in the last 72 hours. Sepsis Labs: Recent Labs  Lab 05/03/20 2132 05/03/20 2133 05/03/20 2321 05/05/20 0839  PROCALCITON  --  <0.10  --  0.73  LATICACIDVEN 2.9*  --  1.5  --     Recent Results (from the past 240 hour(s))  SARS Coronavirus 2 by RT PCR (hospital order, performed in [place] hospital lab) Nasopharyngeal Nasopharyngeal Swab     Status: None   Collection Time: 05/01/20  7:43 PM   Specimen: Nasopharyngeal Swab  Result Value Ref Range Status   SARS Coronavirus 2 NEGATIVE NEGATIVE Final    Comment:  (NOTE) SARS-CoV-2 target nucleic acids are NOT DETECTED.  The SARS-CoV-2 RNA is generally detectable in upper and lower respiratory specimens during the acute phase of infection. The lowest concentration of SARS-CoV-2 viral copies this assay can detect is 250 copies / mL. A negative result does not preclude SARS-CoV-2 infection and should not be used as the sole basis for treatment or other patient management decisions.  A negative result may occur with improper specimen collection / handling, submission of specimen other than nasopharyngeal swab, presence of viral mutation(s) within the areas targeted by this assay, and inadequate number of viral copies (<250 copies / mL). A negative result must be combined with clinical observations, patient history, and epidemiological information.  Fact Sheet for Patients:   StrictlyIdeas.no  Fact Sheet for Healthcare Providers: BankingDealers.co.za  This test is not yet approved or  cleared by the [place] FDA and has been authorized for detection and/or diagnosis of SARS-CoV-2 by FDA under an Emergency Use Authorization (EUA).  This EUA will remain in effect (meaning this test can be used) for the duration of the COVID-19 declaration under Section 564(b)(1) of the Act, 21 U.S.C. section 360bbb-3(b)(1), unless the authorization is terminated or revoked sooner.  Performed at [facility], [address] [place], [place] [address]   MRSA PCR Screening     Status: None   Collection Time: 05/03/20  9:46 PM   Specimen: Nasal Mucosa; Nasopharyngeal  Result Value Ref Range Status   MRSA by PCR NEGATIVE NEGATIVE Final    Comment:        The GeneXpert  MRSA Assay (FDA approved for NASAL specimens only), is one component of a comprehensive MRSA colonization surveillance program. It is not intended to diagnose MRSA infection nor to guide or monitor treatment for MRSA infections. Performed at  [facility], [address], [place], [place] [address]   Culture, blood (x 2)     Status: None (Preliminary result)   Collection Time: 05/03/20 11:21 PM   Specimen: BLOOD  Result Value Ref Range Status   Specimen Description BLOOD  Final   Special Requests NONE  Final   Culture   Final    NO GROWTH < 12 HOURS Performed at [facility], [address], [place], [place] [address]    Report Status PENDING  Incomplete  Culture, blood (x 2)     Status: None (Preliminary result)   Collection Time: 05/03/20 11:26 PM   Specimen: BLOOD RIGHT HAND  Result Value Ref Range Status   Specimen Description BLOOD RIGHT HAND  Final   Special Requests   Final    BOTTLES DRAWN AEROBIC AND ANAEROBIC Blood Culture adequate volume   Culture   Final    NO GROWTH < 12 HOURS Performed at [facility], [address], [place], [place] [address]    Report Status PENDING  Incomplete  Culture, respiratory (non-expectorated)     Status: None (Preliminary result)   Collection Time: 05/04/20  1:46 PM   Specimen: Tracheal Aspirate; Respiratory  Result Value Ref Range Status   Specimen Description   Final    TRACHEAL ASPIRATE Performed at [facility], [address], [place], [place] [address]    Special Requests   Final    NONE Performed at [facility], [address], [place], [place] [address]    Gram Stain   Final    FEW WBC PRESENT, PREDOMINANTLY PMN RARE GRAM POSITIVE COCCI RARE GRAM NEGATIVE RODS    Culture   Final    NO GROWTH < 12 HOURS Performed at [facility], [place] [address] [place], [place] [address]    Report Status PENDING  Incomplete         Radiology Studies: DG Chest 2 View  Result Date: 05/03/2020 CLINICAL DATA:  Shortness of breath EXAM: CHEST - 2 VIEW COMPARISON:  03/15/2014 FINDINGS: Cardiac shadow is mildly prominent. Aortic calcifications are noted. Pleural calcifications are seen in the apices. No focal infiltrate or sizable effusion is seen. Degenerative changes  of the thoracic spine are noted. Old left clavicular fracture with healing is seen. IMPRESSION: No acute abnormality noted. Electronically Signed   By: Inez Catalina M.D.   On: 05/03/2020 13:33   DG Chest Port 1 View  Result Date: 05/05/2020 CLINICAL DATA:  Respiratory failure, intubated EXAM: PORTABLE CHEST 1 VIEW COMPARISON:  Chest radiograph from one day prior. FINDINGS: Endotracheal tube tip is 2.9 cm above the carina. Enteric tube enters stomach with the tip not seen on this image. Stable cardiomediastinal silhouette with mild cardiomegaly. No pneumothorax. Probable small bilateral pleural effusions, slightly increased on the right. Mild diffuse prominence of the parahilar interstitial markings is similar. Mildly increased right basilar atelectasis. Stable left basilar atelectasis. IMPRESSION: 1. Well-positioned support structures. 2. Stable mild cardiomegaly and mild pulmonary edema. 3. Probable small bilateral pleural effusions, slightly increased on the right. 4. Mildly increased right basilar atelectasis. Electronically Signed   By: Ilona Sorrel M.D.   On: 05/05/2020 09:38   DG CHEST PORT 1 VIEW  Result Date: 05/04/2020 CLINICAL DATA:  Respiratory failure. EXAM: PORTABLE  CHEST 1 VIEW COMPARISON:  05/03/2020 FINDINGS: The endotracheal tube is 3.2 cm above the carina. The NG tube is coursing down the esophagus and into the stomach. The heart is enlarged but stable. Stable tortuosity and calcification of the thoracic aorta. Increased density at the left lung base could suggest atelectasis or infiltrate. Streaky right basilar atelectasis also noted. No pulmonary edema or pneumothorax. IMPRESSION: 1. Stable support apparatus. 2. Persistent increased left basilar density suggesting atelectasis or infiltrate. 3. Minimal right basilar atelectasis. Electronically Signed   By: Marijo Sanes M.D.   On: 05/04/2020 07:24   DG CHEST PORT 1 VIEW  Result Date: 05/03/2020 CLINICAL DATA:  Shortness of breath,  intubated EXAM: PORTABLE CHEST 1 VIEW COMPARISON:  05/03/2020 FINDINGS: Endotracheal tube is 4 cm above the carina. NG tube enters the stomach. Bilateral perihilar airspace opacities and interstitial prominence concerning for edema. Heart is borderline in size. No visible effusions or acute bony abnormality. IMPRESSION: Bilateral perihilar opacities and diffuse interstitial prominence, likely edema/CHF. Electronically Signed   By: Rolm Baptise M.D.   On: 05/03/2020 21:29   ECHOCARDIOGRAM LIMITED  Result Date: 05/04/2020    ECHOCARDIOGRAM LIMITED REPORT   Patient Name:   YOSGAR DEMIRJIAN Date of Exam: 05/04/2020 Medical Rec #:  [id number]      Height:       68.0 in Accession #:    [id number]     Weight:       228.0 lb Date of Birth:  May 24, 1946      BSA:          2.161 m Patient Age:    58 years       BP:           126/53 mmHg Patient Gender: M              HR:           108 bpm. Exam Location:  [facility] Procedure: Limited Echo and Intracardiac Opacification Agent Indications:    Elevated Troponin  History:        Patient has prior history of Echocardiogram examinations, most                 recent 05/02/2020. Stroke; Risk Factors:Diabetes, Hypertension,                 Dyslipidemia and Former Smoker. Elevated Troponin.  Sonographer:    Leavy Cella RDCS (AE) Referring Phys: [id number] [place]  1. The entire apex from mid to distal ventricle is akinetic. Pattern can be consistent with stress induced cardiomyopathy/Takotsubo CM, however cannot exclude ischemic etiology     . Left ventricular ejection fraction, by estimation, is 35%. The left ventricle has moderately decreased function.  2. The inferior vena cava is normal in size with greater than 50% respiratory variability, suggesting right atrial pressure of 3 mmHg. FINDINGS  Left Ventricle: The entire apex from mid to distal ventricle is akinetic. Pattern can be consistent with stress induced cardiomyopathy/Takotsubo CM, however cannot  exclude ischemic etiology. Left ventricular ejection fraction, by estimation, is 35%. The left ventricle has moderately decreased function. Definity contrast agent was given IV to delineate the left ventricular endocardial borders. Pericardium: There is no evidence of pericardial effusion. Aortic Valve: Aortic valve mean gradient measures 12.0 mmHg. Aortic valve peak gradient measures 22.8 mmHg. Venous: The inferior vena cava is normal in size with greater than 50% respiratory variability, suggesting right atrial pressure of 3 mmHg. LEFT VENTRICLE PLAX 2D LVIDd:  5.61 cm Diastology LVIDs:         4.50 cm LV e' lateral:   6.74 cm/s LV PW:         1.33 cm LV E/e' lateral: 20.0 LV IVS:        1.37 cm LV e' medial:    8.70 cm/s                        LV E/e' medial:  15.5  LEFT ATRIUM         Index LA diam:    4.50 cm 2.08 cm/m  AORTIC VALVE AV Vmax:           239.00 cm/s AV Vmean:          161.000 cm/s AV VTI:            0.382 m AV Peak Grad:      22.8 mmHg AV Mean Grad:      12.0 mmHg LVOT Vmax:         98.20 cm/s LVOT Vmean:        59.400 cm/s LVOT VTI:          0.152 m LVOT/AV VTI ratio: 0.40  AORTA Ao Root diam: 3.20 cm MITRAL VALVE MV Area (PHT): 7.37 cm     SHUNTS MV Decel Time: 103 msec     Systemic VTI: 0.15 m MV E velocity: 135.00 cm/s Carlyle Dolly MD Electronically signed by Carlyle Dolly MD Signature Date/Time: 05/04/2020/4:05:41 PM    Final         Scheduled Meds: . sodium chloride   Intravenous Once  . allopurinol  100 mg Oral BID  . aspirin EC  81 mg Oral Daily  . atorvastatin  80 mg Oral Daily  . chlorhexidine gluconate (MEDLINE KIT)  15 mL Mouth Rinse BID  . Chlorhexidine Gluconate Cloth  6 each Topical Daily  . clopidogrel  75 mg Oral Daily  . furosemide  40 mg Intravenous BID  . insulin aspart  0-5 Units Subcutaneous QHS  . insulin aspart  0-9 Units Subcutaneous TID WC  . ipratropium-albuterol  3 mL Nebulization Q6H  . mouth rinse  15 mL Mouth Rinse 10 times per day  .  metoprolol tartrate  25 mg Oral TID  . pantoprazole (PROTONIX) IV  40 mg Intravenous Q24H  . polyethylene glycol  8.5 g Oral Daily   Continuous Infusions: . ceFEPime (MAXIPIME) IV Stopped (05/05/20 0926)  . heparin 1,550 Units/hr (05/05/20 0928)  . midazolam 4.5 mg/hr (05/05/20 0928)     LOS: 4 days    Time spent: 40 minutes    Ebba Goll D Manuella Ghazi, DO [facility]  If 7PM-7AM, please contact night-coverage [email or web address] 05/05/2020, 11:41 AM

## 2020-05-06 ENCOUNTER — Inpatient Hospital Stay (HOSPITAL_COMMUNITY): Payer: Medicare Other

## 2020-05-06 DIAGNOSIS — R509 Fever, unspecified: Secondary | ICD-10-CM | POA: Diagnosis not present

## 2020-05-06 DIAGNOSIS — J9601 Acute respiratory failure with hypoxia: Secondary | ICD-10-CM | POA: Diagnosis not present

## 2020-05-06 DIAGNOSIS — I639 Cerebral infarction, unspecified: Secondary | ICD-10-CM | POA: Diagnosis not present

## 2020-05-06 LAB — CBC
HCT: 32.5 % — ABNORMAL LOW (ref 39.0–52.0)
Hemoglobin: 9.9 g/dL — ABNORMAL LOW (ref 13.0–17.0)
MCH: 29.9 pg (ref 26.0–34.0)
MCHC: 30.5 g/dL (ref 30.0–36.0)
MCV: 98.2 fL (ref 80.0–100.0)
Platelets: 213 10*3/uL (ref 150–400)
RBC: 3.31 MIL/uL — ABNORMAL LOW (ref 4.22–5.81)
RDW: 16.1 % — ABNORMAL HIGH (ref 11.5–15.5)
WBC: 5.8 10*3/uL (ref 4.0–10.5)
nRBC: 0 % (ref 0.0–0.2)

## 2020-05-06 LAB — BASIC METABOLIC PANEL
Anion gap: 15 (ref 5–15)
BUN: 24 mg/dL — ABNORMAL HIGH (ref 8–23)
CO2: 21 mmol/L — ABNORMAL LOW (ref 22–32)
Calcium: 8.7 mg/dL — ABNORMAL LOW (ref 8.9–10.3)
Chloride: 107 mmol/L (ref 98–111)
Creatinine, Ser: 1.3 mg/dL — ABNORMAL HIGH (ref 0.61–1.24)
GFR calc Af Amer: >60 mL/min (ref >60)
GFR calc non Af Amer: 53 mL/min — ABNORMAL LOW (ref >60)
Glucose, Bld: 129 mg/dL — ABNORMAL HIGH (ref 70–99)
Potassium: 3.8 mmol/L (ref 3.5–5.1)
Sodium: 143 mmol/L (ref 135–145)

## 2020-05-06 LAB — TRIGLYCERIDES: Triglycerides: 73 mg/dL (ref <150)

## 2020-05-06 LAB — GLUCOSE, CAPILLARY
Glucose-Capillary: 104 mg/dL — ABNORMAL HIGH (ref 70–99)
Glucose-Capillary: 108 mg/dL — ABNORMAL HIGH (ref 70–99)
Glucose-Capillary: 119 mg/dL — ABNORMAL HIGH (ref 70–99)
Glucose-Capillary: 127 mg/dL — ABNORMAL HIGH (ref 70–99)
Glucose-Capillary: 137 mg/dL — ABNORMAL HIGH (ref 70–99)

## 2020-05-06 LAB — PROCALCITONIN: Procalcitonin: 0.54 ng/mL

## 2020-05-06 LAB — MAGNESIUM: Magnesium: 1.8 mg/dL (ref 1.7–2.4)

## 2020-05-06 MED ORDER — FUROSEMIDE 10 MG/ML IJ SOLN
40.0000 mg | Freq: Two times a day (BID) | INTRAMUSCULAR | Status: DC
  Administered 2020-05-06 (×2): 40 mg via INTRAVENOUS
  Filled 2020-05-06 (×2): qty 4

## 2020-05-06 MED ORDER — METOPROLOL TARTRATE 25 MG PO TABS
37.5000 mg | ORAL_TABLET | Freq: Three times a day (TID) | ORAL | Status: DC
  Administered 2020-05-06 – 2020-05-09 (×9): 37.5 mg via ORAL
  Filled 2020-05-06 (×9): qty 2

## 2020-05-06 NOTE — Progress Notes (Signed)
PROGRESS NOTE    Connor Bryant  ZJI:[id number] DOB: May 03, 1946 DOA: 05/01/2020 PCP: Claretta Fraise, MD   Brief Narrative:  FBP:ZWCHEN C Estesis a 74 y.o.malewith medical history significant forrecent iliac stents, diabetes mellitus, hypertension. Patient presented to the ED today with complaints of losing vision in his left eye,started yesterday.Patient woke up yesterday-Saturday morning with this, and this persisted today. Reports it feels like a dark shadow on the peripheral side ofhis left eye. He also reports some tingling in his left arm-this has resolved. No strokes. He denies weakness of his extremities. No change in speech, no facial asymmetry. Patient took his aspirin and Plavix and statins today. Patient had bilateral iliac stents,2 on the right, one on the left placed about 5 days ago at [facility].  The only cardiac history patient is aware of is a cardiac murmur. He is not aware of history of irregular heart rhythm.  ED Course:Blood pressure systolic 1 1 5-1 2 7, heart rate 49-59,hemoglobin 8.6,recent baseline about 11.Creatinine 1.7, about baseline. Head CT shows acute right occipital infarct. EKG showing atrial fibrillation. Telemetry neurology consulted-patient outside TPA window, stroke work-up.Dualantiplatelet for now. Hospitalist to admit for acute CVA.  8/11:Patient patient admitted with lateral vision loss to left eye with noted acute right-sided CVA. Overnight he has developed acute hypoxemic respiratory failure and required intubation on mechanical ventilator. He has been noted to have troponin elevation suggestive of NSTEMI and has been started on heparin drip. Cardiology following as well as pulmonology for vent management. There is suspicion he may have also aspirated with potential for HCAP.  8/12: Patient noted to have worsening LV dysfunction on 2D echocardiogram.  Discussed with family yesterday and elected for  patient to be DNR.  He was also noted to have atrial fibrillation with RVR overnight for which Cardizem was started.  Discontinued by cardiology today and started on metoprolol.  Planning to wean ventilator today if possible.  Continues on cefepime.  Continue Lasix for diuresis.  8/13: Patient noted to have some fevers overnight which have improved this a.m.  Blood cultures with no growth noted thus far.  Creatinine levels are improving and 2 L fluid output noted with Lasix.  He was noted to have some A. fib with RVR overnight which resolved with IV metoprolol.  Metoprolol dose increased by cardiology with redosing of IV Lasix today.  Renal ultrasound with no acute findings and hematuria is noted to be clearing.  Plan to wean off vent today.  Assessment & Plan:   Principal Problem:   Acute CVA (cerebrovascular accident) [facility]) Active Problems:   Peripheral vascular disease ([place])   Gout   Essential hypertension   Type 2 diabetes mellitus without complication, without long-term current use of insulin (HCC)   Pure hypercholesterolemia   S/P insertion of iliac artery stent   Unspecified atrial fibrillation (HCC)   Elevated troponin   Fever   Positive D dimer   Acute respiratory failure with hypoxia ([place])   Endotracheally intubated   Acute hypoxemic respiratory failure status post intubation 8/10 -Likely with some component of pulmonary edema as well as aspiration with concern for HCAP -Given Lasix overnight, with Lasix continued per cardiology -DuoNeb for potential component of air trapping with probable COPD -Continue on cefepime for coverage of potential HCAP, vancomycin discontinued as MRSA negative -Continue ventilator management per pulmonology recommendations, try to wean today. -D dimer elevated previously.  May consider PE study once renal function further improved. -Plan to wean off ventilator  today  NSTEMI -Heparin discontinued due to ecchymosis of her groin region -Continue  on Plavix and aspirin with no further Eliquis at this time -Continue atorvastatin -Beta-blocker to resume once blood pressure stabilizes -Limited 2D echocardiogram with LVEF 35% and apical akinesis.  Not a candidate for emergent catheterization at this time.  May reflect stress-induced cardiomyopathy versus MI.  Cardiology to reconsider catheterization by 8/16 -IV Lasix x 2 more doses today; to L negative fluid balance overnight -No need for immediate catheterization, continue to follow per cardiology with metoprolol initiated today -Troponin downward trending with peak of 2500  Acute right occipital CVA with vision loss to left eye -LDL 54 -A1c 6.2% -He was started on Eliquis with recommendation to continue Plavix by his vascular surgeon Dr. Rosana Hoes at [facility] will need 30-day Holter monitoring in outpatient setting -Appreciate neurology recommendations  Stage III CKD with mild renal insufficiency -Continue to monitor with ongoing diuresis -Baseline creatinine 1.2-1.3 -Creatinine stable at 1.3  Probable paroxysmal atrial fibrillation -Likely to require outpatient 30-day monitor for further evaluation given acute CVA -Heparin drip held for now due to ecchymosis of her groin region -Elevated heart rates noted overnight on 8/12.  Started on Lopressor 25 mg 3 times daily by cardiology, this has been increased to 37.5 mg 3 times daily today given elevated heart rates overnight.  Anemia-chronic iron deficiency anemia noted-stable -Improved after 1 unit PRBC transfusion -Plan to follow CBC -Stable despite hematuria  Hematuria -Continue to monitor while on heparin drip -Could be related to traumatic Foley insertion  Diabetes, non-insulin-dependent with nephropathy -Holding home oral Metformin and pioglitazone -Continue SSI with stable blood glucose readings noted  History of COPD-Long history of tobacco abuse -We will need outpatient pulmonology follow-up for  PFTs -Continue on duo nebs every 6 hours  PVD -Status post mesenteric artery bypass with recent bilateral iliac stenting at [facility] 04/27/2020 -Plan to continue on Eliquis and Plavix outpatient and hold aspirin, per discussion with vascular surgeon at [facility] if and once stable -Currently with bruising over the right groin region with pressure dressing applied.  Anticoagulation held.  Hypertension -Patient on metoprolol per cardiology -Further diuresis with IV Lasix ordered  DVT prophylaxis:Heparin drip discontinued 8/12, SCDs Code Status:Full code Family Communication:  Discussed with son 8/13 Disposition Plan: Status is: Inpatient  Remains inpatient appropriate because:Ongoing diagnostic testing needed not appropriate for outpatient work up, IV treatments appropriate due to intensity of illness or inability to take PO and Inpatient level of care appropriate due to severity of illness   Dispo: The patient is from:Home Anticipated d/c is to:SNF Anticipated d/c date is: 3 days Patient currently is not medically stable to d/c.  May require transfer for cardiac catheterization by 8/16.  Consultants:  Cardiology  Neurology  Pulmonology  Procedures:  See below  Antimicrobials:  Anti-infectives (From admission, onward)   Start     Dose/Rate Route Frequency Ordered Stop   05/04/20 2200  vancomycin (VANCOREADY) IVPB 1250 mg/250 mL  Status:  Discontinued        1,250 mg 166.7 mL/hr over 90 Minutes Intravenous Every 24 hours 05/03/20 2335 05/04/20 0118   05/04/20 1400  ceFEPIme (MAXIPIME) 2 g in sodium chloride 0.9 % 100 mL IVPB     Discontinue     2 g 200 mL/hr over 30 Minutes Intravenous Every 12 hours 05/04/20 1348     05/04/20 1000  ceFEPIme (MAXIPIME) 2 g in sodium chloride 0.9 % 100 mL IVPB  Status:  Discontinued        2 g 200 mL/hr over 30 Minutes Intravenous Every 12 hours 05/03/20 2335 05/04/20 0942    05/03/20 2315  vancomycin (VANCOREADY) IVPB 2000 mg/400 mL        2,000 mg 200 mL/hr over 120 Minutes Intravenous  Once 05/03/20 2303 05/04/20 0153   05/03/20 2315  ceFEPIme (MAXIPIME) 2 g in sodium chloride 0.9 % 100 mL IVPB  Status:  Discontinued        2 g 200 mL/hr over 30 Minutes Intravenous  Once 05/03/20 2303 05/04/20 0118       Subjective: Patient seen and evaluated today with noted atrial fibrillation with RVR overnight that responded well to IV metoprolol.  Hematuria is improving.  Objective: Vitals:   05/06/20 1145 05/06/20 1146 05/06/20 1200 05/06/20 1215  BP: (!) 147/105  (!) 139/59 (!) 135/59  Pulse: (!) 102 (!) 107 98 100  Resp: (!) 26 (!) 28 (!) 23 20  Temp: 99.3 F (37.4 C) 99.3 F (37.4 C) 99.3 F (37.4 C) 99.1 F (37.3 C)  TempSrc:  Bladder    SpO2: 97% 97% 98% 99%  Weight:      Height:        Intake/Output Summary (Last 24 hours) at 05/06/2020 1220 Last data filed at 05/06/2020 0947 Gross per 24 hour  Intake 297.84 ml  Output 2450 ml  Net -2152.16 ml   Filed Weights   05/01/20 1641 05/05/20 0457 05/06/20 0530  Weight: 103.4 kg 103.4 kg 98.5 kg    Examination:  General exam: Appears calm and comfortable  Respiratory system: Clear to auscultation. Respiratory effort normal.  Currently on ventilator FiO2 35%. Cardiovascular system: S1 & S2 heard, tachycardic. Gastrointestinal system: Abdomen is soft Central nervous system: Alert and awake on ventilator Extremities: Trace edema Skin: Ecchymosis to right groin and scrotal region with pressure dressing in place Psychiatry: Cannot be evaluated    Data Reviewed: I have personally reviewed following labs and imaging studies  CBC: Recent Labs  Lab 05/01/20 1702 05/02/20 0400 05/03/20 0445 05/03/20 0445 05/03/20 2133 05/03/20 2133 05/04/20 0407 05/04/20 1948 05/05/20 0319 05/05/20 0752 05/06/20 0400  WBC 7.8   < > 4.8  --  8.9  --  9.2  --  7.0  --  5.8  NEUTROABS 5.9  --   --   --   --    --   --   --   --   --   --   HGB 8.6*   < > 7.5*   < > 10.7*   < > 9.2* 9.7* 9.2* 10.2* 9.9*  HCT 26.5*   < > 24.4*   < > 34.6*   < > 31.8* 31.4* 29.6* 33.9* 32.5*  MCV 96.4   < > 99.2  --  97.5  --  103.6*  --  97.7  --  98.2  PLT 211   < > 172  --  266  --  177  --  212  --  213   < > = values in this interval not displayed.   Basic Metabolic Panel: Recent Labs  Lab 05/01/20 0502 05/01/20 1702 05/03/20 0445 05/03/20 2133 05/04/20 0407 05/05/20 0319 05/06/20 0400  NA  --    < > 140 139 141 141 143  K  --    < > 3.9 4.0 4.2 4.2 3.8  CL  --    < > 109 107 108 111 107  CO2  --    < >  22 19* 19* 20* 21*  GLUCOSE  --    < > 121* 283* 134* 143* 129*  BUN  --    < > 27* 25* 26* 25* 24*  CREATININE  --    < > 1.34* 1.59* 1.56* 1.51* 1.30*  CALCIUM  --    < > 8.5* 8.4* 8.5* 8.4* 8.7*  MG 1.8  --   --  1.8  --  1.8 1.8  PHOS  --   --  3.7 6.4*  --   --   --    < > = values in this interval not displayed.   GFR: Estimated Creatinine Clearance: 55.8 mL/min (A) (by C-G formula based on SCr of 1.3 mg/dL (H)). Liver Function Tests: Recent Labs  Lab 05/01/20 1702 05/03/20 0445 05/03/20 2133 05/05/20 0319  AST 23  --  24 41  ALT 20  --  18 19  ALKPHOS 60  --  70 55  BILITOT 1.0  --  1.3* 1.4*  PROT 7.2  --  7.0 7.0  ALBUMIN 3.7 3.1* 3.5 3.2*   No results for input(s): LIPASE, AMYLASE in the last 168 hours. No results for input(s): AMMONIA in the last 168 hours. Coagulation Profile: Recent Labs  Lab 05/01/20 1702 05/03/20 2133  INR 1.1 1.0   Cardiac Enzymes: No results for input(s): CKTOTAL, CKMB, CKMBINDEX, TROPONINI in the last 168 hours. BNP (last 3 results) No results for input(s): PROBNP in the last 8760 hours. HbA1C: No results for input(s): HGBA1C in the last 72 hours. CBG: Recent Labs  Lab 05/05/20 2039 05/06/20 0045 05/06/20 0505 05/06/20 0731 05/06/20 1148  GLUCAP 129* 127* 137* 108* 104*   Lipid Profile: Recent Labs    05/03/20 2133  TRIG 109    Thyroid Function Tests: No results for input(s): TSH, T4TOTAL, FREET4, T3FREE, THYROIDAB in the last 72 hours. Anemia Panel: No results for input(s): VITAMINB12, FOLATE, FERRITIN, TIBC, IRON, RETICCTPCT in the last 72 hours. Sepsis Labs: Recent Labs  Lab 05/03/20 2132 05/03/20 2133 05/03/20 2321 05/05/20 0839 05/06/20 0400  PROCALCITON  --  <0.10  --  0.73 0.54  LATICACIDVEN 2.9*  --  1.5  --   --     Recent Results (from the past 240 hour(s))  SARS Coronavirus 2 by RT PCR (hospital order, performed in [place] hospital lab) Nasopharyngeal Nasopharyngeal Swab     Status: None   Collection Time: 05/01/20  7:43 PM   Specimen: Nasopharyngeal Swab  Result Value Ref Range Status   SARS Coronavirus 2 NEGATIVE NEGATIVE Final    Comment: (NOTE) SARS-CoV-2 target nucleic acids are NOT DETECTED.  The SARS-CoV-2 RNA is generally detectable in upper and lower respiratory specimens during the acute phase of infection. The lowest concentration of SARS-CoV-2 viral copies this assay can detect is 250 copies / mL. A negative result does not preclude SARS-CoV-2 infection and should not be used as the sole basis for treatment or other patient management decisions.  A negative result may occur with improper specimen collection / handling, submission of specimen other than nasopharyngeal swab, presence of viral mutation(s) within the areas targeted by this assay, and inadequate number of viral copies (<250 copies / mL). A negative result must be combined with clinical observations, patient history, and epidemiological information.  Fact Sheet for Patients:   StrictlyIdeas.no  Fact Sheet for Healthcare Providers: BankingDealers.co.za  This test is not yet approved or  cleared by the [place] FDA and has been authorized for detection  and/or diagnosis of SARS-CoV-2 by FDA under an Emergency Use Authorization (EUA).  This EUA will  remain in effect (meaning this test can be used) for the duration of the COVID-19 declaration under Section 564(b)(1) of the Act, 21 U.S.C. section 360bbb-3(b)(1), unless the authorization is terminated or revoked sooner.  Performed at [facility], [address] [place], [place] [address]   MRSA PCR Screening     Status: None   Collection Time: 05/03/20  9:46 PM   Specimen: Nasal Mucosa; Nasopharyngeal  Result Value Ref Range Status   MRSA by PCR NEGATIVE NEGATIVE Final    Comment:        The GeneXpert MRSA Assay (FDA approved for NASAL specimens only), is one component of a comprehensive MRSA colonization surveillance program. It is not intended to diagnose MRSA infection nor to guide or monitor treatment for MRSA infections. Performed at [facility], [address] [place], [place] [address]   Culture, blood (x 2)     Status: None (Preliminary result)   Collection Time: 05/03/20 11:21 PM   Specimen: BLOOD  Result Value Ref Range Status   Specimen Description BLOOD  Final   Special Requests NONE  Final   Culture   Final    NO GROWTH 3 DAYS Performed at [facility], [address], [place], [place] [address]    Report Status PENDING  Incomplete  Culture, blood (x 2)     Status: None (Preliminary result)   Collection Time: 05/03/20 11:26 PM   Specimen: BLOOD RIGHT HAND  Result Value Ref Range Status   Specimen Description BLOOD RIGHT HAND  Final   Special Requests   Final    BOTTLES DRAWN AEROBIC AND ANAEROBIC Blood Culture adequate volume   Culture   Final    NO GROWTH 3 DAYS Performed at [facility], [address] [place], [place] [address]    Report Status PENDING  Incomplete  Culture, respiratory (non-expectorated)     Status: None (Preliminary result)   Collection Time: 05/04/20  1:46 PM   Specimen: Tracheal Aspirate; Respiratory  Result Value Ref Range Status   Specimen Description   Final    TRACHEAL ASPIRATE Performed at [facility], [address], [place], [place] [address]    Special Requests   Final    NONE Performed at [facility], [address], [place], [place] [address]    Gram Stain   Final    FEW WBC PRESENT, PREDOMINANTLY PMN RARE GRAM POSITIVE COCCI RARE GRAM NEGATIVE RODS    Culture   Final    CULTURE REINCUBATED FOR BETTER GROWTH Performed at [facility], [place] [address], [place], [place] [address]    Report Status PENDING  Incomplete  Culture, blood (routine x 2)     Status: None (Preliminary result)   Collection Time: 05/05/20  6:03 PM   Specimen: Left Antecubital; Blood  Result Value Ref Range Status   Specimen Description LEFT ANTECUBITAL  Final   Special Requests   Final    BOTTLES DRAWN AEROBIC ONLY Blood Culture adequate volume   Culture   Final    NO GROWTH < 12 HOURS Performed at [facility], [address] [place],  [address]    Report Status PENDING  Incomplete  Culture, blood (routine x 2)     Status: None (Preliminary result)   Collection Time: 05/05/20  8:10 PM   Specimen: BLOOD LEFT ARM  Result Value Ref Range Status   Specimen Description  BLOOD LEFT ARM  Final   Special Requests   Final    BOTTLES DRAWN AEROBIC AND ANAEROBIC Blood Culture adequate volume   Culture   Final    NO GROWTH < 12 HOURS Performed at [facility], [address] [place], [place] [address]    Report Status PENDING  Incomplete         Radiology Studies: US RENAL  Result Date: 05/06/2020 CLINICAL DATA:  Hematuria EXAM: RENAL ULTRASOUND COMPARISON:  CT abdomen and pelvis October 13, 2018 FINDINGS: Right Kidney: Renal measurements: 11.8 x 5.3 x 6.2 cm = volume: 200.4 mL . Echogenicity and renal cortical thickness are within normal limits. No mass, perinephric fluid, or hydronephrosis visualized. No sonographically demonstrable calculus or ureterectasis. Left Kidney: Renal measurements: 12.7 x 5.7 x 6.0 cm = volume: 226.0 mL. Echogenicity and renal cortical thickness are within normal limits. No mass,  perinephric fluid, or hydronephrosis visualized. No sonographically demonstrable calculus or ureterectasis. Bladder: Unable to visualize with Foley catheter in place. Other: None. IMPRESSION: Normal appearing kidneys bilaterally. Unable to visualize urinary bladder with Foley catheter in place. Electronically Signed   By: Lowella Grip III M.D.   On: 05/06/2020 09:21   DG Chest Port 1 View  Result Date: 05/05/2020 CLINICAL DATA:  Respiratory failure, intubated EXAM: PORTABLE CHEST 1 VIEW COMPARISON:  Chest radiograph from one day prior. FINDINGS: Endotracheal tube tip is 2.9 cm above the carina. Enteric tube enters stomach with the tip not seen on this image. Stable cardiomediastinal silhouette with mild cardiomegaly. No pneumothorax. Probable small bilateral pleural effusions, slightly increased on the right. Mild diffuse prominence of the parahilar interstitial markings is similar. Mildly increased right basilar atelectasis. Stable left basilar atelectasis. IMPRESSION: 1. Well-positioned support structures. 2. Stable mild cardiomegaly and mild pulmonary edema. 3. Probable small bilateral pleural effusions, slightly increased on the right. 4. Mildly increased right basilar atelectasis. Electronically Signed   By: Ilona Sorrel M.D.   On: 05/05/2020 09:38   ECHOCARDIOGRAM LIMITED  Result Date: 05/04/2020    ECHOCARDIOGRAM LIMITED REPORT   Patient Name:   Connor Bryant Date of Exam: 05/04/2020 Medical Rec #:  [id number]      Height:       68.0 in Accession #:    [id number]     Weight:       228.0 lb Date of Birth:  1945-09-30      BSA:          2.161 m Patient Age:    61 years       BP:           126/53 mmHg Patient Gender: M              HR:           108 bpm. Exam Location:  [facility] Procedure: Limited Echo and Intracardiac Opacification Agent Indications:    Elevated Troponin  History:        Patient has prior history of Echocardiogram examinations, most                 recent 05/02/2020. Stroke; Risk  Factors:Diabetes, Hypertension,                 Dyslipidemia and Former Smoker. Elevated Troponin.  Sonographer:    Leavy Cella RDCS (AE) Referring Phys: [id number] [place]  1. The entire apex from mid to distal ventricle is akinetic. Pattern can be consistent with stress induced cardiomyopathy/Takotsubo CM, however cannot exclude  ischemic etiology     . Left ventricular ejection fraction, by estimation, is 35%. The left ventricle has moderately decreased function.  2. The inferior vena cava is normal in size with greater than 50% respiratory variability, suggesting right atrial pressure of 3 mmHg. FINDINGS  Left Ventricle: The entire apex from mid to distal ventricle is akinetic. Pattern can be consistent with stress induced cardiomyopathy/Takotsubo CM, however cannot exclude ischemic etiology. Left ventricular ejection fraction, by estimation, is 35%. The left ventricle has moderately decreased function. Definity contrast agent was given IV to delineate the left ventricular endocardial borders. Pericardium: There is no evidence of pericardial effusion. Aortic Valve: Aortic valve mean gradient measures 12.0 mmHg. Aortic valve peak gradient measures 22.8 mmHg. Venous: The inferior vena cava is normal in size with greater than 50% respiratory variability, suggesting right atrial pressure of 3 mmHg. LEFT VENTRICLE PLAX 2D LVIDd:         5.61 cm Diastology LVIDs:         4.50 cm LV e' lateral:   6.74 cm/s LV PW:         1.33 cm LV E/e' lateral: 20.0 LV IVS:        1.37 cm LV e' medial:    8.70 cm/s                        LV E/e' medial:  15.5  LEFT ATRIUM         Index LA diam:    4.50 cm 2.08 cm/m  AORTIC VALVE AV Vmax:           239.00 cm/s AV Vmean:          161.000 cm/s AV VTI:            0.382 m AV Peak Grad:      22.8 mmHg AV Mean Grad:      12.0 mmHg LVOT Vmax:         98.20 cm/s LVOT Vmean:        59.400 cm/s LVOT VTI:          0.152 m LVOT/AV VTI ratio: 0.40  AORTA Ao Root diam: 3.20  cm MITRAL VALVE MV Area (PHT): 7.37 cm     SHUNTS MV Decel Time: 103 msec     Systemic VTI: 0.15 m MV E velocity: 135.00 cm/s Carlyle Dolly MD Electronically signed by Carlyle Dolly MD Signature Date/Time: 05/04/2020/4:05:41 PM    Final         Scheduled Meds: . allopurinol  100 mg Oral BID  . aspirin EC  81 mg Oral Daily  . atorvastatin  80 mg Oral Daily  . chlorhexidine gluconate (MEDLINE KIT)  15 mL Mouth Rinse BID  . Chlorhexidine Gluconate Cloth  6 each Topical Daily  . clopidogrel  75 mg Oral Daily  . furosemide  40 mg Intravenous BID  . insulin aspart  0-5 Units Subcutaneous QHS  . insulin aspart  0-9 Units Subcutaneous TID WC  . ipratropium-albuterol  3 mL Nebulization Q6H  . mouth rinse  15 mL Mouth Rinse 10 times per day  . metoprolol tartrate  37.5 mg Oral TID  . pantoprazole (PROTONIX) IV  40 mg Intravenous Q24H  . polyethylene glycol  8.5 g Oral Daily   Continuous Infusions: . ceFEPime (MAXIPIME) IV 2 g (05/06/20 1100)  . midazolam 2 mg/hr (05/06/20 1152)     LOS: 5 days    Time spent: 35 minutes  Yannis Gumbs Darleen Crocker, DO [facility]  If 7PM-7AM, please contact night-coverage [email or web address] 05/06/2020, 12:20 PM

## 2020-05-06 NOTE — Progress Notes (Signed)
Progress Note  Patient Name: Connor Bryant Date of Encounter: 05/06/2020  [facility] Cardiologist: New  Subjective   Some afib with elevated rates overnight  Inpatient Medications    Scheduled Meds: . allopurinol  100 mg Oral BID  . aspirin EC  81 mg Oral Daily  . atorvastatin  80 mg Oral Daily  . chlorhexidine gluconate (MEDLINE KIT)  15 mL Mouth Rinse BID  . Chlorhexidine Gluconate Cloth  6 each Topical Daily  . clopidogrel  75 mg Oral Daily  . insulin aspart  0-5 Units Subcutaneous QHS  . insulin aspart  0-9 Units Subcutaneous TID WC  . ipratropium-albuterol  3 mL Nebulization Q6H  . mouth rinse  15 mL Mouth Rinse 10 times per day  . metoprolol tartrate  25 mg Oral TID  . pantoprazole (PROTONIX) IV  40 mg Intravenous Q24H  . polyethylene glycol  8.5 g Oral Daily   Continuous Infusions: . ceFEPime (MAXIPIME) IV Stopped (05/05/20 2206)  . midazolam 2 mg/hr (05/06/20 0947)   PRN Meds: acetaminophen **OR** acetaminophen, fentaNYL (SUBLIMAZE) injection, hydrALAZINE, metoprolol tartrate, midazolam   Vital Signs    Vitals:   05/06/20 0930 05/06/20 0945 05/06/20 1000 05/06/20 1015  BP: (!) 150/58 (!) 146/56 (!) 134/49 (!) 126/56  Pulse: (!) 103 (!) 105 (!) 102 (!) 102  Resp: (!) 29 (!) 26 (!) 22 (!) 23  Temp: 99 F (37.2 C) 99.1 F (37.3 C) 99.3 F (37.4 C) 99.5 F (37.5 C)  TempSrc:      SpO2: 97% 97% 97% 97%  Weight:      Height:        Intake/Output Summary (Last 24 hours) at 05/06/2020 1026 Last data filed at 05/06/2020 0947 Gross per 24 hour  Intake 297.84 ml  Output 2450 ml  Net -2152.16 ml   Last 3 Weights 05/06/2020 05/05/2020 05/01/2020  Weight (lbs) 217 lb 2.5 oz 227 lb 15.3 oz 228 lb  Weight (kg) 98.5 kg 103.4 kg 103.42 kg      Telemetry    afib variable rates - Personally Reviewed  ECG    n/a - Personally Reviewed  Physical Exam   GEN: No acute distress.   Neck: No JVD Cardiac: irreg, 2/6 systolic murmur rusb, no murmurs, rubs, or  gallops.  Respiratory: Clear to auscultation bilaterally. GI: Soft, nontender, non-distended  MS: No edema; No deformity. Neuro:  Nonfocal  Psych: sedated, cannot assess  Labs    High Sensitivity Troponin:   Recent Labs  Lab 05/04/20 0442 05/04/20 1622 05/04/20 1948 05/05/20 0319 05/05/20 0752  TROPONINIHS 2,324* 2,506* 2,499* 2,354* 540*      Chemistry Recent Labs  Lab 05/01/20 1702 05/02/20 0400 05/03/20 0445 05/03/20 0445 05/03/20 2133 05/03/20 2133 05/04/20 0407 05/05/20 0319 05/06/20 0400  NA 136   < > 140   < > 139   < > 141 141 143  K 4.0   < > 3.9   < > 4.0   < > 4.2 4.2 3.8  CL 104   < > 109   < > 107   < > 108 111 107  CO2 18*   < > 22   < > 19*   < > 19* 20* 21*  GLUCOSE 174*   < > 121*   < > 283*   < > 134* 143* 129*  BUN 40*   < > 27*   < > 25*   < > 26* 25* 24*  CREATININE 1.79*   < >  1.34*   < > 1.59*   < > 1.56* 1.51* 1.30*  CALCIUM 9.1   < > 8.5*   < > 8.4*   < > 8.5* 8.4* 8.7*  PROT 7.2  --   --   --  7.0  --   --  7.0  --   ALBUMIN 3.7  --  3.1*  --  3.5  --   --  3.2*  --   AST 23  --   --   --  24  --   --  41  --   ALT 20  --   --   --  18  --   --  19  --   ALKPHOS 60  --   --   --  70  --   --  55  --   BILITOT 1.0  --   --   --  1.3*  --   --  1.4*  --   GFRNONAA 36*   < > 51*   < > 42*  --  43*  --  53*  GFRAA 42*   < > 60*   < > 48*  --  50*  --  >60  ANIONGAP 14   < > 9   < > 13   < > 14 10.0 15   < > = values in this interval not displayed.     Hematology Recent Labs  Lab 05/04/20 0407 05/04/20 1948 05/05/20 0319 05/05/20 0752 05/06/20 0400  WBC 9.2  --  7.0  --  5.8  RBC 3.07*  --  3.03*  --  3.31*  HGB 9.2*   < > 9.2* 10.2* 9.9*  HCT 31.8*   < > 29.6* 33.9* 32.5*  MCV 103.6*  --  97.7  --  98.2  MCH 30.0  --  30.4  --  29.9  MCHC 28.9*  --  31.1  --  30.5  RDW 16.4*  --  16.3*  --  16.1*  PLT 177  --  212  --  213   < > = values in this interval not displayed.    BNP Recent Labs  Lab 05/03/20 2133 05/05/20 0319   BNP 1,082.0* 764.0*     DDimer  Recent Labs  Lab 05/03/20 2133  DDIMER 3.83*     Radiology    US RENAL  Result Date: 05/06/2020 CLINICAL DATA:  Hematuria EXAM: RENAL ULTRASOUND COMPARISON:  CT abdomen and pelvis October 13, 2018 FINDINGS: Right Kidney: Renal measurements: 11.8 x 5.3 x 6.2 cm = volume: 200.4 mL . Echogenicity and renal cortical thickness are within normal limits. No mass, perinephric fluid, or hydronephrosis visualized. No sonographically demonstrable calculus or ureterectasis. Left Kidney: Renal measurements: 12.7 x 5.7 x 6.0 cm = volume: 226.0 mL. Echogenicity and renal cortical thickness are within normal limits. No mass, perinephric fluid, or hydronephrosis visualized. No sonographically demonstrable calculus or ureterectasis. Bladder: Unable to visualize with Foley catheter in place. Other: None. IMPRESSION: Normal appearing kidneys bilaterally. Unable to visualize urinary bladder with Foley catheter in place. Electronically Signed   By: Lowella Grip III M.D.   On: 05/06/2020 09:21   DG Chest Port 1 View  Result Date: 05/05/2020 CLINICAL DATA:  Respiratory failure, intubated EXAM: PORTABLE CHEST 1 VIEW COMPARISON:  Chest radiograph from one day prior. FINDINGS: Endotracheal tube tip is 2.9 cm above the carina. Enteric tube enters stomach with the tip not seen on this image. Stable cardiomediastinal  silhouette with mild cardiomegaly. No pneumothorax. Probable small bilateral pleural effusions, slightly increased on the right. Mild diffuse prominence of the parahilar interstitial markings is similar. Mildly increased right basilar atelectasis. Stable left basilar atelectasis. IMPRESSION: 1. Well-positioned support structures. 2. Stable mild cardiomegaly and mild pulmonary edema. 3. Probable small bilateral pleural effusions, slightly increased on the right. 4. Mildly increased right basilar atelectasis. Electronically Signed   By: Ilona Sorrel M.D.   On: 05/05/2020 09:38     ECHOCARDIOGRAM LIMITED  Result Date: 05/04/2020    ECHOCARDIOGRAM LIMITED REPORT   Patient Name:   Connor Bryant Date of Exam: 05/04/2020 Medical Rec #:  [id number]      Height:       68.0 in Accession #:    [id number]     Weight:       228.0 lb Date of Birth:  07-Sep-1946      BSA:          2.161 m Patient Age:    74 years       BP:           126/53 mmHg Patient Gender: M              HR:           108 bpm. Exam Location:  [facility] Procedure: Limited Echo and Intracardiac Opacification Agent Indications:    Elevated Troponin  History:        Patient has prior history of Echocardiogram examinations, most                 recent 05/02/2020. Stroke; Risk Factors:Diabetes, Hypertension,                 Dyslipidemia and Former Smoker. Elevated Troponin.  Sonographer:    Leavy Cella RDCS (AE) Referring Phys: [id number] Victor  1. The entire apex from mid to distal ventricle is akinetic. Pattern can be consistent with stress induced cardiomyopathy/Takotsubo CM, however cannot exclude ischemic etiology     . Left ventricular ejection fraction, by estimation, is 35%. The left ventricle has moderately decreased function.  2. The inferior vena cava is normal in size with greater than 50% respiratory variability, suggesting right atrial pressure of 3 mmHg. FINDINGS  Left Ventricle: The entire apex from mid to distal ventricle is akinetic. Pattern can be consistent with stress induced cardiomyopathy/Takotsubo CM, however cannot exclude ischemic etiology. Left ventricular ejection fraction, by estimation, is 35%. The left ventricle has moderately decreased function. Definity contrast agent was given IV to delineate the left ventricular endocardial borders. Pericardium: There is no evidence of pericardial effusion. Aortic Valve: Aortic valve mean gradient measures 12.0 mmHg. Aortic valve peak gradient measures 22.8 mmHg. Venous: The inferior vena cava is normal in size with greater than 50% respiratory  variability, suggesting right atrial pressure of 3 mmHg. LEFT VENTRICLE PLAX 2D LVIDd:         5.61 cm Diastology LVIDs:         4.50 cm LV e' lateral:   6.74 cm/s LV PW:         1.33 cm LV E/e' lateral: 20.0 LV IVS:        1.37 cm LV e' medial:    8.70 cm/s                        LV E/e' medial:  15.5  LEFT ATRIUM         Index LA  diam:    4.50 cm 2.08 cm/m  AORTIC VALVE AV Vmax:           239.00 cm/s AV Vmean:          161.000 cm/s AV VTI:            0.382 m AV Peak Grad:      22.8 mmHg AV Mean Grad:      12.0 mmHg LVOT Vmax:         98.20 cm/s LVOT Vmean:        59.400 cm/s LVOT VTI:          0.152 m LVOT/AV VTI ratio: 0.40  AORTA Ao Root diam: 3.20 cm MITRAL VALVE MV Area (PHT): 7.37 cm     SHUNTS MV Decel Time: 103 msec     Systemic VTI: 0.15 m MV E velocity: 135.00 cm/s Carlyle Dolly MD Electronically signed by Carlyle Dolly MD Signature Date/Time: 05/04/2020/4:05:41 PM    Final     Cardiac Studies     Patient Profile      Marvis Moeller a 74 y.o.malewith past medical history of PVD (s/p prior mesenteric artery bypass and recent bilateral iliac stenting at [facility] on 04/27/2020),HTN, HLD,Type 2 DMand Stage 3 CKDwho is being seen today for the evaluation of anNSTEMIat the request ofDr. Manuella Ghazi.   Assessment & Plan    1. NSTEMI - initially admitted with stroke - during admission acute onset SOB, hypoxia requiring intubation. Evidence of acute pulmonary edema.  - peak trop 2500 and trending down. EKG SR, lateral TWIs on admission and f/u, anteroseptal Qwaves - echo LVEF 35% (acute change from 05/02/20 echo) with apical akinesis.   - possible acute plaque rupture vs coronary embolic event given presented with new onset afib and stroke vs. Stress induced CM/Takotsubo CM given echo findings  - given recent stroke and respiratory failure on ventilator not candidate for emergent cath - medical therapy with ASA, plavix (had been on at home due to prior PAD intervention), atorva 20  (increase to high dose 51m). Hep gtt stopped due to hematuria. Started oral lopressor 281mtid. No ACE/ARB given renal dysfunction.   - possible cath later admission pending clinical progress   2. Acute systolic HF - acute onset SOB during admission requring intubation, evidence of pulmonary edema - - echo LVEF 35% (acute change from 05/02/20 echo) with apical akinesis.   - possible acute plaque rupture vs coronary embolic event given presented with new onset afib and stroke vs. Stress induced CM/Takotsubo CM given echo findings - BNP1000--> 764, edema on CXR  - neg 2.1 L yesterday, neg  3L since admission. Received IV lasix 4057mid yesterday. Cr downtrending with diuresis, redose IV lasix 39m81md today (reassess tomorrow for ongoing need) - start oral lopressor, can consolidate to long acting when no longer have to crush pills. NO ACE/ARB/ARNI given renal dysfunction   2. Afib - new diagnosis this admission, difficult to determine based on admit EKG. Telemetry reviewed from ER showing episodes of afib. He definitely had clear cut afib yeseterday evening for extended period - hep gtt stopped due to hematuria, oral doac on hold since admission  - would avoid dilt gtt given low LVEF. If recurrent afib would start amiodarone with plans for short course.  - start oral lopressor 25mg13m with hold paramaters. Consolidate to long acting once pills don't have to be crushed  - some issues with afib and elevated rates, increase oral lopressor to 37.5mg13m  tid  3. CVA - per primary team - Acute right occipital CVA with vision loss to left eye  4. Fever - abx per primary team   5. Hematuria - per primary team  6. Respiratory failure - remains intubated, followed by pulmonary  For questions or updates, please contact [place] Please consult [email or web address] for contact info under        Signed, Carlyle Dolly, MD  05/06/2020, 10:26 AM

## 2020-05-06 NOTE — Progress Notes (Signed)
NAME:  Connor Bryant, MRN:  [id number], DOB:  09/17/1946, LOS: 5 ADMISSION DATE:  05/01/2020, CONSULTATION DATE:  8/11 REFERRING MD:  Shah/ [facility], CHIEF COMPLAINT:  resp distress    Brief History   74 yowm quit smoking 10 y PTA   admitted with viz loss and R occipital cva as well as mod AS /anemia abruptly decompensated pm 8/10 and placed on vent and PCCM service asked to eval am 8/12   History of present illness   Per chart : Was started on Eliquis 23m BID on 05/03/2020. His Hgb was down to 7.5 and he reported dyspnea, therefore he received 1 unit pRBC's pm 8/10    Around 2000 8/10, he developed worsening dyspnea and saturations were at 52% on 4L [place]. Was tachycardiac with HR in the 130's to 140's. Initially on NRB and required intubation. Was febrile to 101.7. EKG showed a narrow-complex tachycardia with ST depression along the lateral leads. ABG showed pH 7.282 with pCO2 45.6. Lactic Acid 2.9. BNP 1082. Mg 1.8. CBC showing Hgb improved to 10.7. D-dimer 3.83. Initial HS Troponin 452 with repeat values of 1128 and 2324.  CXR showing bilateral opacities and edema.   Past Medical History  He,  has a past medical history of Arthritis, DM type 2 (diabetes mellitus, type 2) [place], [place], Heart murmur, HTN (hypertension), Hyperlipidemia, Personal history of colonic polyp-adenoma (11/24/2013), Renal insufficiency, and Thoracic spine fracture ([place].    Significant Hospital Events   Acute pulmonary edema p trx 8/10 pm > et   Consults:  Cards 8/11 PCCM 8/11   Procedures:  ET  8/10 - 8/13   Significant Diagnostic Tests:  Echo 8/9  Mild LVE/ AS with mean gradient 18 and LA / RA nl size Echo 8/11 acute decreased EF to 35 %  Stress vs ischemic cm   Micro Data:  Covid 19  8/8 neg MRSA screen 8/10 neg  PCT  8/10   < 0.10   BC x 2  8/10 >>> ET 8/11  mixed organisms >>>  Antimicrobials:  Cefepime 8/10 >>> Vanc   IV  8/10  >>>  8/11    Scheduled Meds: . allopurinol  100 mg Oral BID  .  aspirin EC  81 mg Oral Daily  . atorvastatin  80 mg Oral Daily  . chlorhexidine gluconate (MEDLINE KIT)  15 mL Mouth Rinse BID  . Chlorhexidine Gluconate Cloth  6 each Topical Daily  . clopidogrel  75 mg Oral Daily  . furosemide  40 mg Intravenous BID  . insulin aspart  0-5 Units Subcutaneous QHS  . insulin aspart  0-9 Units Subcutaneous TID WC  . ipratropium-albuterol  3 mL Nebulization Q6H  . mouth rinse  15 mL Mouth Rinse 10 times per day  . metoprolol tartrate  37.5 mg Oral TID  . pantoprazole (PROTONIX) IV  40 mg Intravenous Q24H  . polyethylene glycol  8.5 g Oral Daily   Continuous Infusions: . ceFEPime (MAXIPIME) IV 2 g (05/06/20 1100)  . midazolam Stopped (05/06/20 1324)   PRN Meds:.acetaminophen **OR** acetaminophen, fentaNYL (SUBLIMAZE) injection, hydrALAZINE, metoprolol tartrate, midazolam   Interim history/subjective:  Sedated on vent and comfortable, nods approp/ follows commands bp noted a bit high given echo findings with pos trop  Objective   Blood pressure (!) 169/95, pulse (!) 119, temperature 100.2 F (37.9 C), temperature source Bladder, resp. rate (!) 29, height _0  (1.727 m), weight 98.5 kg, SpO2 100 %.    Vent Mode: CPAP;PSV FiO2 (%):  [  35 %-40 %] 35 % Set Rate:  [14 bmp] 14 bmp Vt Set:  [540 mL] 540 mL PEEP:  [5 cmH20] 5 cmH20 Pressure Support:  [5 cmH20-8 cmH20] 8 cmH20 Plateau Pressure:  [15 cmH20-16 cmH20] 15 cmH20   Intake/Output Summary (Last 24 hours) at 05/06/2020 1657 Last data filed at 05/06/2020 1411 Gross per 24 hour  Intake 309.08 ml  Output 1450 ml  Net -1140.92 ml   Filed Weights   05/01/20 1641 05/05/20 0457 05/06/20 0530  Weight: 103.4 kg 103.4 kg 98.5 kg    Examination: Tmax  102.7  Pt nods approp No jvd Neck supple Lungs with a few scattered exp > insp rhonchi bilaterally RRR no s3 II/VI sem  Abd obese with limited  excursion  Extr warm with no edema or clubbing noted      I personally reviewed images and agree with  radiology impression as follows:  CXR:   Portable 8/12 1. Well-positioned support structures. 2. Stable mild cardiomegaly and mild pulmonary edema. 3. Probable small bilateral pleural effusions, slightly increased on the right. 4. Mildly increased right basilar atelectasis.    Resolved Hospital Problem list      Assessment & Plan:  1 ) Acute resp distress/ pulmoary edema ? Related to trx with  in pt with Mod AS/ BNP > 1k during decompensation  - improved am 8/12 and fm wants ncb > extubated pm   8/13 and can be prn though NCB status remains    2) Prob copd with air trapping mild  - rx with duoneb  3)  Fever ? Aspiration ? HCAP   - abx approp, see flowsheet   - assoc with high ESR 8/12 and PCT trending down now s increase wbc  >>>no clear source of fever but may be trending down now   4) Acute on chronic mild renal insff  = baseline 1.21 on 10/02/2018  On ACEi on admit Lab Results  Component Value Date   CREATININE 1.30 (H) 05/06/2020   CREATININE 1.51 (H) 05/05/2020   CREATININE 1.56 (H) 05/04/2020   monitor uop/ avoid nephrotoxins    - note hematuria am 8/12 on hep drip    5) anemia s/p  one unit prbc 8/10    Lab Results  Component Value Date   HGB 9.9 (L) 05/06/2020   HGB 10.2 (L) 05/05/2020   HGB 9.2 (L) 05/05/2020   HGB 11.4 (L) 02/29/2020   HGB 11.2 (L) 02/25/2019   HGB 11.8 (L) 08/26/2018     Best practice:  Diet: npo Pain/Anxiety/Delirium protocol (if indicated):  Per [facility] VAP protocol (if indicated):  DVT prophylaxis: hep IV  GI prophylaxis: ppi IV  Glucose control:  SSI  Mobility: bed rest Code Status: NCB Family Communication: per [facility] Disposition: ICU   Labs   CBC: Recent Labs  Lab 05/01/20 1702 05/02/20 0400 05/03/20 0445 05/03/20 0445 05/03/20 2133 05/03/20 2133 05/04/20 0407 05/04/20 1948 05/05/20 0319 05/05/20 0752 05/06/20 0400  WBC 7.8   < > 4.8  --  8.9  --  9.2  --  7.0  --  5.8  NEUTROABS 5.9  --   --   --   --   --   --    --   --   --   --   HGB 8.6*   < > 7.5*   < > 10.7*   < > 9.2* 9.7* 9.2* 10.2* 9.9*  HCT 26.5*   < > 24.4*   < >  34.6*   < > 31.8* 31.4* 29.6* 33.9* 32.5*  MCV 96.4   < > 99.2  --  97.5  --  103.6*  --  97.7  --  98.2  PLT 211   < > 172  --  266  --  177  --  212  --  213   < > = values in this interval not displayed.    Basic Metabolic Panel: Recent Labs  Lab 05/01/20 0502 05/01/20 1702 05/03/20 0445 05/03/20 2133 05/04/20 0407 05/05/20 0319 05/06/20 0400  NA  --    < > 140 139 141 141 143  K  --    < > 3.9 4.0 4.2 4.2 3.8  CL  --    < > 109 107 108 111 107  CO2  --    < > 22 19* 19* 20* 21*  GLUCOSE  --    < > 121* 283* 134* 143* 129*  BUN  --    < > 27* 25* 26* 25* 24*  CREATININE  --    < > 1.34* 1.59* 1.56* 1.51* 1.30*  CALCIUM  --    < > 8.5* 8.4* 8.5* 8.4* 8.7*  MG 1.8  --   --  1.8  --  1.8 1.8  PHOS  --   --  3.7 6.4*  --   --   --    < > = values in this interval not displayed.   GFR: Estimated Creatinine Clearance: 55.8 mL/min (A) (by C-G formula based on SCr of 1.3 mg/dL (H)). Recent Labs  Lab 05/03/20 0445 05/03/20 2132 05/03/20 2133 05/03/20 2321 05/04/20 0407 05/05/20 0319 05/05/20 0839 05/06/20 0400  PROCALCITON  --   --  <0.10  --   --   --  0.73 0.54  WBC   < >  --  8.9  --  9.2 7.0  --  5.8  LATICACIDVEN  --  2.9*  --  1.5  --   --   --   --    < > = values in this interval not displayed.    Liver Function Tests: Recent Labs  Lab 05/01/20 1702 05/03/20 0445 05/03/20 2133 05/05/20 0319  AST 23  --  24 41  ALT 20  --  18 19  ALKPHOS 60  --  70 55  BILITOT 1.0  --  1.3* 1.4*  PROT 7.2  --  7.0 7.0  ALBUMIN 3.7 3.1* 3.5 3.2*   No results for input(s): LIPASE, AMYLASE in the last 168 hours. No results for input(s): AMMONIA in the last 168 hours.  ABG    Component Value Date/Time   PHART 7.375 05/04/2020 0530   PCO2ART 37.3 05/04/2020 0530   PO2ART 99.0 05/04/2020 0530   HCO3 22.0 05/04/2020 0530   TCO2 26 03/11/2014 0432    ACIDBASEDEF 3.1 (H) 05/04/2020 0530   O2SAT 95.5 05/04/2020 0530     Coagulation Profile: Recent Labs  Lab 05/01/20 1702 05/03/20 2133  INR 1.1 1.0    Cardiac Enzymes: No results for input(s): CKTOTAL, CKMB, CKMBINDEX, TROPONINI in the last 168 hours.  HbA1C: HB A1C (BAYER DCA - WAIVED)  Date/Time Value Ref Range Status  02/29/2020 02:56 PM 6.1 <7.0 % Final    Comment:  Diabetic Adult            <7.0                                       Healthy Adult        4.3 - 5.7                                                           (DCCT/NGSP) American Diabetes Association's Summary of Glycemic Recommendations for Adults with Diabetes: Hemoglobin A1c <7.0%. More stringent glycemic goals (A1c <6.0%) may further reduce complications at the cost of increased risk of hypoglycemia.   08/27/2019 01:56 PM 6.3 <7.0 % Final    Comment:                                          Diabetic Adult            <7.0                                       Healthy Adult        4.3 - 5.7                                                           (DCCT/NGSP) American Diabetes Association's Summary of Glycemic Recommendations for Adults with Diabetes: Hemoglobin A1c <7.0%. More stringent glycemic goals (A1c <6.0%) may further reduce complications at the cost of increased risk of hypoglycemia.    Hgb A1c MFr Bld  Date/Time Value Ref Range Status  05/02/2020 04:00 AM 6.2 (H) 4.8 - 5.6 % Final    Comment:    (NOTE) Pre diabetes:          5.7%-6.4%  Diabetes:              >6.4%  Glycemic control for   <7.0% adults with diabetes     CBG: Recent Labs  Lab 05/05/20 2039 05/06/20 0045 05/06/20 0505 05/06/20 0731 05/06/20 1148  GLUCAP 129* 127* 137* 108* 104*    Pt successfully extubated pm 8/13 and PCCM f/u is prn     Christinia Gully, MD Pulmonary and [place] [phone]   After 7:00 pm call Elink   [phone]

## 2020-05-06 NOTE — Progress Notes (Signed)
[facility] Progress Note Patient Name: Connor Bryant DOB: 1946/04/29 MRN: [id number]   Date of Service  05/06/2020  HPI/Events of Note  Patient with Afib with RVR, he received 5 mg of lopressor and has now slowed his rate to the 80's.  eICU Interventions  Will hold off on Amiodarone for now, will treat RVR with PRN iv metoprolol        Connor Bryant Connor Bryant 05/06/2020, 12:21 AM

## 2020-05-06 NOTE — Procedures (Signed)
**Note De-Identified Ziyah Cordoba Obfuscation** Extubation Procedure Note  Patient Details:   Name: TORREN MAFFEO DOB: [birth year] MRN: [id number]   Airway Documentation:    Vent end date: 05/06/20 Vent end time: 1336   Evaluation  O2 sats: stable throughout Complications: No apparent complications Patient did tolerate procedure well. Bilateral Breath Sounds: Rhonchi, Diminished   Yes VS WNL, BBS diminished, parameters WNL, +leak  Jentri Aye, Penni Bombard 05/06/2020, 1:37 PM

## 2020-05-06 NOTE — Progress Notes (Signed)
Patient's HR slowed to 80's, confirmed SR on EKG.

## 2020-05-07 DIAGNOSIS — I639 Cerebral infarction, unspecified: Secondary | ICD-10-CM | POA: Diagnosis not present

## 2020-05-07 LAB — CBC
HCT: 33.1 % — ABNORMAL LOW (ref 39.0–52.0)
Hemoglobin: 10.1 g/dL — ABNORMAL LOW (ref 13.0–17.0)
MCH: 29.5 pg (ref 26.0–34.0)
MCHC: 30.5 g/dL (ref 30.0–36.0)
MCV: 96.8 fL (ref 80.0–100.0)
Platelets: 255 10*3/uL (ref 150–400)
RBC: 3.42 MIL/uL — ABNORMAL LOW (ref 4.22–5.81)
RDW: 15.6 % — ABNORMAL HIGH (ref 11.5–15.5)
WBC: 6.5 10*3/uL (ref 4.0–10.5)
nRBC: 0 % (ref 0.0–0.2)

## 2020-05-07 LAB — BASIC METABOLIC PANEL
Anion gap: 14 (ref 5–15)
BUN: 29 mg/dL — ABNORMAL HIGH (ref 8–23)
CO2: 25 mmol/L (ref 22–32)
Calcium: 9.2 mg/dL (ref 8.9–10.3)
Chloride: 103 mmol/L (ref 98–111)
Creatinine, Ser: 1.29 mg/dL — ABNORMAL HIGH (ref 0.61–1.24)
GFR calc Af Amer: >60 mL/min (ref >60)
GFR calc non Af Amer: 54 mL/min — ABNORMAL LOW (ref >60)
Glucose, Bld: 119 mg/dL — ABNORMAL HIGH (ref 70–99)
Potassium: 3.2 mmol/L — ABNORMAL LOW (ref 3.5–5.1)
Sodium: 142 mmol/L (ref 135–145)

## 2020-05-07 LAB — GLUCOSE, CAPILLARY
Glucose-Capillary: 106 mg/dL — ABNORMAL HIGH (ref 70–99)
Glucose-Capillary: 114 mg/dL — ABNORMAL HIGH (ref 70–99)
Glucose-Capillary: 118 mg/dL — ABNORMAL HIGH (ref 70–99)
Glucose-Capillary: 129 mg/dL — ABNORMAL HIGH (ref 70–99)
Glucose-Capillary: 133 mg/dL — ABNORMAL HIGH (ref 70–99)
Glucose-Capillary: 140 mg/dL — ABNORMAL HIGH (ref 70–99)
Glucose-Capillary: 150 mg/dL — ABNORMAL HIGH (ref 70–99)

## 2020-05-07 LAB — PROCALCITONIN: Procalcitonin: 0.35 ng/mL

## 2020-05-07 LAB — CULTURE, RESPIRATORY W GRAM STAIN: Culture: NORMAL

## 2020-05-07 LAB — BRAIN NATRIURETIC PEPTIDE: B Natriuretic Peptide: 487 pg/mL — ABNORMAL HIGH (ref 0.0–100.0)

## 2020-05-07 LAB — MAGNESIUM: Magnesium: 1.8 mg/dL (ref 1.7–2.4)

## 2020-05-07 MED ORDER — POTASSIUM CHLORIDE CRYS ER 20 MEQ PO TBCR
40.0000 meq | EXTENDED_RELEASE_TABLET | Freq: Two times a day (BID) | ORAL | Status: AC
  Administered 2020-05-07 (×2): 40 meq via ORAL
  Filled 2020-05-07 (×2): qty 2

## 2020-05-07 NOTE — Progress Notes (Addendum)
PROGRESS NOTE    Connor Bryant  WUX:[id number] DOB: [birth year] DOA: 05/01/2020 PCP: Claretta Fraise, MD   Brief Narrative:  OZD:GUYQIH C Estesis a 74 y.o.malewith medical history significant forrecent iliac stents, diabetes mellitus, hypertension. Patient presented to the ED today with complaints of losing vision in his left eye,started yesterday.Patient woke up yesterday-Saturday morning with this, and this persisted today. Reports it feels like a dark shadow on the peripheral side ofhis left eye. He also reports some tingling in his left arm-this has resolved. No strokes. He denies weakness of his extremities. No change in speech, no facial asymmetry. Patient took his aspirin and Plavix and statins today. Patient had bilateral iliac stents,2 on the right, one on the left placed about 5 days ago at [facility].  The only cardiac history patient is aware of is a cardiac murmur. He is not aware of history of irregular heart rhythm.  ED Course:Blood pressure systolic 1 1 5-1 2 7, heart rate 49-59,hemoglobin 8.6,recent baseline about 11.Creatinine 1.7, about baseline. Head CT shows acute right occipital infarct. EKG showing atrial fibrillation. Telemetry neurology consulted-patient outside TPA window, stroke work-up.Dualantiplatelet for now. Hospitalist to admit for acute CVA.  8/11:Patient patient admitted with lateral vision loss to left eye with noted acute right-sided CVA. Overnight he has developed acute hypoxemic respiratory failure and required intubation on mechanical ventilator. He has been noted to have troponin elevation suggestive of NSTEMI and has been started on heparin drip. Cardiology following as well as pulmonology for vent management. There is suspicion he may have also aspirated with potential for HCAP.  8/12:Patient noted to have worsening LV dysfunction on 2D echocardiogram. Discussed with family yesterday and elected for  patient to be DNR. He was also noted to have atrial fibrillation with RVR overnight for which Cardizem was started. Discontinued by cardiology today and started on metoprolol. Planning to wean ventilator today if possible. Continues on cefepime. Continue Lasix for diuresis.  8/13: Patient noted to have some fevers overnight which have improved this a.m.  Blood cultures with no growth noted thus far.  Creatinine levels are improving and 2 L fluid output noted with Lasix.  He was noted to have some A. fib with RVR overnight which resolved with IV metoprolol.  Metoprolol dose increased by cardiology with redosing of IV Lasix today.  Renal ultrasound with no acute findings and hematuria is noted to be clearing.  Plan to wean off vent today.  8/14: Patient weaned off vent on 8/13.  Noted to remain stable overnight with no further bleeding noted.  Appears to be tolerating diet this morning.  Appears euvolemic on 3 L nasal cannula oxygen with dark urine output noted.   Assessment & Plan:   Principal Problem:   Acute CVA (cerebrovascular accident) [facility]) Active Problems:   Peripheral vascular disease ([place])   Gout   Essential hypertension   Type 2 diabetes mellitus without complication, without long-term current use of insulin (HCC)   Pure hypercholesterolemia   S/P insertion of iliac artery stent   Unspecified atrial fibrillation (HCC)   Elevated troponin   Fever   Positive D dimer   Acute respiratory failure with hypoxia ([place])   Endotracheally intubated   Acute hypoxemic respiratory failure status post intubation 8/10-status post extubation -Likely with some component of pulmonary edema as well as aspiration with concern for HCAP -Given Lasix overnight, with Lasix continued per cardiology -DuoNeb for potential component of air trapping with probable COPD -Continue on cefepime for coverage  of potential HCAP, vancomycin discontinued as MRSA negative -Continue ventilator management per  pulmonology recommendations, try to wean today. -D dimer elevated previously.  May consider PE study once renal function further improved. -Patient currently on 3 L nasal cannula oxygen with ventilator weaned on 8/13  NSTEMI -Heparin discontinued due to ecchymosis of her groin region -Continue on Plavix and aspirin with no further Eliquis at this time -Continue atorvastatin -Beta-blocker to resume once blood pressure stabilizes -Limited 2D echocardiogramwith LVEF 35% and apical akinesis. Not a candidate for emergent catheterization at this time. May reflect stress-induced cardiomyopathy versus MI.  Cardiology to reconsider catheterization by 8/16 -IV Lasix to be held for now -No need for immediate catheterization, continue to follow per cardiologywith metoprolol initiated today -Troponin downward trending with peak of 2500 -Plan for possible cardiac catheterization by 8/16  Acute right occipital CVA with vision loss to left eye -LDL 54 -A1c 6.2% -He was started on Eliquis with recommendation to continue Plavix by his vascular surgeon Dr. Rosana Hoes at [facility] will need 30-day Holter monitoring in outpatient setting -Appreciate neurology recommendations  Stage III CKD with mild renal insufficiency -Continue to monitor with ongoing diuresis -Baseline creatinine 1.2-1.3 -Creatinine stable at 1.3  Probable paroxysmal atrial fibrillation -Likely to require outpatient 30-day monitor for further evaluation given acute CVA -Heparin drip held for now due to ecchymosis of her groin region -Elevated heart rates noted overnight on 8/12.  Started on Lopressor 25 mg 3 times daily by cardiology, this has been increased to 37.5 mg 3 times daily today given elevated heart rates overnight. -IV metoprolol on board for heart rate elevations  Anemia-chronic iron deficiency anemia noted-stable -Improved after 1 unit PRBC transfusion -Plan to follow CBC -Stable despite  hematuria  Hematuria -Continue to monitor while on heparin drip -Could be related to traumatic Foley insertion  Diabetes, non-insulin-dependent with nephropathy -Holding home oral Metformin and pioglitazone -Continue SSI with stable blood glucose readings noted  History of COPD-Long history of tobacco abuse -We will need outpatient pulmonology follow-up for PFTs -Continue on duo nebs every 6 hours  PVD -Status post mesenteric artery bypass with recent bilateral iliac stenting at [facility] 04/27/2020 -Plan to continue on Eliquis and Plavix outpatient and hold aspirin, per discussion with vascular surgeon at [facility] if and once stable -Currently with bruising over the right groin region with pressure dressing applied.  Anticoagulation held. -Plan to remove pressure dressing on 8/14 and reevaluate  Hypertension -Patient on metoprolol per cardiology -Further diuresis held for now  DVT prophylaxis:Heparin drip discontinued 8/12, SCDs Code Status:Full code Family Communication: Discussed with son 8/14 Disposition Plan: Status is: Inpatient  Remains inpatient appropriate because:Ongoing diagnostic testing needed not appropriate for outpatient work up, IV treatments appropriate due to intensity of illness or inability to take PO and Inpatient level of care appropriate due to severity of illness   Dispo: The patient is from:Home Anticipated d/c is to:SNF Anticipated d/c date is: 3 days Patient currently is not medically stable to d/c.  May require transfer for cardiac catheterization by 8/16.  Consultants:  Cardiology  Neurology  Pulmonology  Procedures:  See below  Antimicrobials:  Anti-infectives (From admission, onward)   Start     Dose/Rate Route Frequency Ordered Stop   05/04/20 2200  vancomycin (VANCOREADY) IVPB 1250 mg/250 mL  Status:  Discontinued        1,250 mg 166.7 mL/hr over 90 Minutes  Intravenous Every 24 hours 05/03/20 2335 05/04/20 0118   05/04/20 1400  ceFEPIme (MAXIPIME) 2 g in sodium chloride 0.9 % 100 mL IVPB     Discontinue     2 g 200 mL/hr over 30 Minutes Intravenous Every 12 hours 05/04/20 1348     05/04/20 1000  ceFEPIme (MAXIPIME) 2 g in sodium chloride 0.9 % 100 mL IVPB  Status:  Discontinued        2 g 200 mL/hr over 30 Minutes Intravenous Every 12 hours 05/03/20 2335 05/04/20 0942   05/03/20 2315  vancomycin (VANCOREADY) IVPB 2000 mg/400 mL        2,000 mg 200 mL/hr over 120 Minutes Intravenous  Once 05/03/20 2303 05/04/20 0153   05/03/20 2315  ceFEPIme (MAXIPIME) 2 g in sodium chloride 0.9 % 100 mL IVPB  Status:  Discontinued        2 g 200 mL/hr over 30 Minutes Intravenous  Once 05/03/20 2303 05/04/20 0118       Subjective: Patient seen and evaluated today with no new acute complaints or concerns. No acute concerns or events noted overnight.  Objective: Vitals:   05/07/20 0700 05/07/20 0737 05/07/20 0800 05/07/20 1000  BP:   (!) 149/68 139/81  Pulse: 82  87 (!) 144  Resp: 16  18 20   Temp: 98.4 F (36.9 C)  98.4 F (36.9 C) 98.6 F (37 C)  TempSrc:      SpO2: 100% 100% 96% 97%  Weight:      Height:        Intake/Output Summary (Last 24 hours) at 05/07/2020 1042 Last data filed at 05/06/2020 1730 Gross per 24 hour  Intake 11.24 ml  Output 1000 ml  Net -988.76 ml   Filed Weights   05/05/20 0457 05/06/20 0530 05/07/20 0414  Weight: 103.4 kg 98.5 kg 102.3 kg    Examination:  General exam: Appears calm and comfortable  Respiratory system: Clear to auscultation. Respiratory effort normal.  Currently on 3 L nasal cannula oxygen Cardiovascular system: S1 & S2 heard, RRR.  Gastrointestinal system: Abdomen is soft Central nervous system: Alert and oriented. No focal neurological deficits. Extremities: No edema Skin: No rashes, lesions or ulcers Psychiatry: Flat affect    Data Reviewed: I have personally reviewed following labs and  imaging studies  CBC: Recent Labs  Lab 05/01/20 1702 05/02/20 0400 05/03/20 2133 05/03/20 2133 05/04/20 0407 05/04/20 0407 05/04/20 1948 05/05/20 0319 05/05/20 0752 05/06/20 0400 05/07/20 0653  WBC 7.8   < > 8.9  --  9.2  --   --  7.0  --  5.8 6.5  NEUTROABS 5.9  --   --   --   --   --   --   --   --   --   --   HGB 8.6*   < > 10.7*   < > 9.2*   < > 9.7* 9.2* 10.2* 9.9* 10.1*  HCT 26.5*   < > 34.6*   < > 31.8*   < > 31.4* 29.6* 33.9* 32.5* 33.1*  MCV 96.4   < > 97.5  --  103.6*  --   --  97.7  --  98.2 96.8  PLT 211   < > 266  --  177  --   --  212  --  213 255   < > = values in this interval not displayed.   Basic Metabolic Panel: Recent Labs  Lab 05/01/20 0502 05/01/20 1702 05/03/20 0445 05/03/20 0445 05/03/20 2133 05/04/20 0407 05/05/20 0319 05/06/20 0400 05/07/20 0653  NA  --    < >  140   < > 139 141 141 143 142  K  --    < > 3.9   < > 4.0 4.2 4.2 3.8 3.2*  CL  --    < > 109   < > 107 108 111 107 103  CO2  --    < > 22   < > 19* 19* 20* 21* 25  GLUCOSE  --    < > 121*   < > 283* 134* 143* 129* 119*  BUN  --    < > 27*   < > 25* 26* 25* 24* 29*  CREATININE  --    < > 1.34*   < > 1.59* 1.56* 1.51* 1.30* 1.29*  CALCIUM  --    < > 8.5*   < > 8.4* 8.5* 8.4* 8.7* 9.2  MG 1.8  --   --   --  1.8  --  1.8 1.8 1.8  PHOS  --   --  3.7  --  6.4*  --   --   --   --    < > = values in this interval not displayed.   GFR: Estimated Creatinine Clearance: 57.4 mL/min (A) (by C-G formula based on SCr of 1.29 mg/dL (H)). Liver Function Tests: Recent Labs  Lab 05/01/20 1702 05/03/20 0445 05/03/20 2133 05/05/20 0319  AST 23  --  24 41  ALT 20  --  18 19  ALKPHOS 60  --  70 55  BILITOT 1.0  --  1.3* 1.4*  PROT 7.2  --  7.0 7.0  ALBUMIN 3.7 3.1* 3.5 3.2*   No results for input(s): LIPASE, AMYLASE in the last 168 hours. No results for input(s): AMMONIA in the last 168 hours. Coagulation Profile: Recent Labs  Lab 05/01/20 1702 05/03/20 2133  INR 1.1 1.0   Cardiac  Enzymes: No results for input(s): CKTOTAL, CKMB, CKMBINDEX, TROPONINI in the last 168 hours. BNP (last 3 results) No results for input(s): PROBNP in the last 8760 hours. HbA1C: No results for input(s): HGBA1C in the last 72 hours. CBG: Recent Labs  Lab 05/06/20 1148 05/06/20 2008 05/07/20 0039 05/07/20 0416 05/07/20 0730  GLUCAP 104* 119* 140* 114* 106*   Lipid Profile: Recent Labs    05/06/20 2106  TRIG 73   Thyroid Function Tests: No results for input(s): TSH, T4TOTAL, FREET4, T3FREE, THYROIDAB in the last 72 hours. Anemia Panel: No results for input(s): VITAMINB12, FOLATE, FERRITIN, TIBC, IRON, RETICCTPCT in the last 72 hours. Sepsis Labs: Recent Labs  Lab 05/03/20 2132 05/03/20 2133 05/03/20 2321 05/05/20 0839 05/06/20 0400 05/07/20 0653  PROCALCITON  --  <0.10  --  0.73 0.54 0.35  LATICACIDVEN 2.9*  --  1.5  --   --   --     Recent Results (from the past 240 hour(s))  SARS Coronavirus 2 by RT PCR (hospital order, performed in [place] hospital lab) Nasopharyngeal Nasopharyngeal Swab     Status: None   Collection Time: 05/01/20  7:43 PM   Specimen: Nasopharyngeal Swab  Result Value Ref Range Status   SARS Coronavirus 2 NEGATIVE NEGATIVE Final    Comment: (NOTE) SARS-CoV-2 target nucleic acids are NOT DETECTED.  The SARS-CoV-2 RNA is generally detectable in upper and lower respiratory specimens during the acute phase of infection. The lowest concentration of SARS-CoV-2 viral copies this assay can detect is 250 copies / mL. A negative result does not preclude SARS-CoV-2 infection and should not be used as the sole  basis for treatment or other patient management decisions.  A negative result may occur with improper specimen collection / handling, submission of specimen other than nasopharyngeal swab, presence of viral mutation(s) within the areas targeted by this assay, and inadequate number of viral copies (<250 copies / mL). A negative result must be  combined with clinical observations, patient history, and epidemiological information.  Fact Sheet for Patients:   StrictlyIdeas.no  Fact Sheet for Healthcare Providers: BankingDealers.co.za  This test is not yet approved or  cleared by the [place] FDA and has been authorized for detection and/or diagnosis of SARS-CoV-2 by FDA under an Emergency Use Authorization (EUA).  This EUA will remain in effect (meaning this test can be used) for the duration of the COVID-19 declaration under Section 564(b)(1) of the Act, 21 U.S.C. section 360bbb-3(b)(1), unless the authorization is terminated or revoked sooner.  Performed at [facility], [address] [place], [place] [address]   MRSA PCR Screening     Status: None   Collection Time: 05/03/20  9:46 PM   Specimen: Nasal Mucosa; Nasopharyngeal  Result Value Ref Range Status   MRSA by PCR NEGATIVE NEGATIVE Final    Comment:        The GeneXpert MRSA Assay (FDA approved for NASAL specimens only), is one component of a comprehensive MRSA colonization surveillance program. It is not intended to diagnose MRSA infection nor to guide or monitor treatment for MRSA infections. Performed at [facility], [address] [place], [place] [address]   Culture, blood (x 2)     Status: None (Preliminary result)   Collection Time: 05/03/20 11:21 PM   Specimen: BLOOD  Result Value Ref Range Status   Specimen Description BLOOD  Final   Special Requests NONE  Final   Culture   Final    NO GROWTH 4 DAYS Performed at [facility], [address] [place], [place] [address]    Report Status PENDING  Incomplete  Culture, blood (x 2)     Status: None (Preliminary result)   Collection Time: 05/03/20 11:26 PM   Specimen: BLOOD RIGHT HAND  Result Value Ref Range Status   Specimen Description BLOOD RIGHT HAND  Final   Special Requests   Final    BOTTLES DRAWN AEROBIC AND ANAEROBIC Blood Culture  adequate volume   Culture   Final    NO GROWTH 4 DAYS Performed at [facility], [address], [place], [place] [address]    Report Status PENDING  Incomplete  Culture, respiratory (non-expectorated)     Status: None (Preliminary result)   Collection Time: 05/04/20  1:46 PM   Specimen: Tracheal Aspirate; Respiratory  Result Value Ref Range Status   Specimen Description   Final    TRACHEAL ASPIRATE Performed at [facility], [address] [place], [place] [address]    Special Requests   Final    NONE Performed at [facility], [address], [place], [place] [address]    Gram Stain   Final    FEW WBC PRESENT, PREDOMINANTLY PMN RARE GRAM POSITIVE COCCI RARE GRAM NEGATIVE RODS    Culture   Final    CULTURE REINCUBATED FOR BETTER GROWTH Performed at [facility], [place] [address] [place], [place] [address]    Report Status PENDING  Incomplete  Culture, blood (routine x 2)     Status: None (Preliminary result)   Collection Time: 05/05/20  6:03 PM   Specimen: Left Antecubital; Blood  Result Value Ref Range  Status   Specimen Description LEFT ANTECUBITAL  Final   Special Requests   Final    BOTTLES DRAWN AEROBIC ONLY Blood Culture adequate volume   Culture   Final    NO GROWTH 2 DAYS Performed at [facility], [address], [place], [place] [address]    Report Status PENDING  Incomplete  Culture, blood (routine x 2)     Status: None (Preliminary result)   Collection Time: 05/05/20  8:10 PM   Specimen: BLOOD LEFT ARM  Result Value Ref Range Status   Specimen Description BLOOD LEFT ARM  Final   Special Requests   Final    BOTTLES DRAWN AEROBIC AND ANAEROBIC Blood Culture adequate volume   Culture   Final    NO GROWTH 2 DAYS Performed at [facility], [address], [place], [place] [address]    Report Status PENDING  Incomplete         Radiology Studies: US RENAL  Result Date: 05/06/2020 CLINICAL DATA:  Hematuria EXAM: RENAL ULTRASOUND COMPARISON:  CT  abdomen and pelvis October 13, 2018 FINDINGS: Right Kidney: Renal measurements: 11.8 x 5.3 x 6.2 cm = volume: 200.4 mL . Echogenicity and renal cortical thickness are within normal limits. No mass, perinephric fluid, or hydronephrosis visualized. No sonographically demonstrable calculus or ureterectasis. Left Kidney: Renal measurements: 12.7 x 5.7 x 6.0 cm = volume: 226.0 mL. Echogenicity and renal cortical thickness are within normal limits. No mass, perinephric fluid, or hydronephrosis visualized. No sonographically demonstrable calculus or ureterectasis. Bladder: Unable to visualize with Foley catheter in place. Other: None. IMPRESSION: Normal appearing kidneys bilaterally. Unable to visualize urinary bladder with Foley catheter in place. Electronically Signed   By: Lowella Grip III M.D.   On: 05/06/2020 09:21        Scheduled Meds: . allopurinol  100 mg Oral BID  . aspirin EC  81 mg Oral Daily  . atorvastatin  80 mg Oral Daily  . chlorhexidine gluconate (MEDLINE KIT)  15 mL Mouth Rinse BID  . Chlorhexidine Gluconate Cloth  6 each Topical Daily  . clopidogrel  75 mg Oral Daily  . insulin aspart  0-5 Units Subcutaneous QHS  . insulin aspart  0-9 Units Subcutaneous TID WC  . ipratropium-albuterol  3 mL Nebulization Q6H  . metoprolol tartrate  37.5 mg Oral TID  . pantoprazole (PROTONIX) IV  40 mg Intravenous Q24H  . polyethylene glycol  8.5 g Oral Daily  . potassium chloride  40 mEq Oral BID   Continuous Infusions: . ceFEPime (MAXIPIME) IV 2 g (05/07/20 0951)     LOS: 6 days    Time spent: 35 minutes    Nicky Milhouse D Manuella Ghazi, DO [facility]  If 7PM-7AM, please contact night-coverage [email or web address] 05/07/2020, 10:42 AM

## 2020-05-08 ENCOUNTER — Inpatient Hospital Stay (HOSPITAL_COMMUNITY): Payer: Medicare Other

## 2020-05-08 DIAGNOSIS — I639 Cerebral infarction, unspecified: Secondary | ICD-10-CM | POA: Diagnosis not present

## 2020-05-08 LAB — CBC
HCT: 27.1 % — ABNORMAL LOW (ref 39.0–52.0)
Hemoglobin: 8.4 g/dL — ABNORMAL LOW (ref 13.0–17.0)
MCH: 29.8 pg (ref 26.0–34.0)
MCHC: 31 g/dL (ref 30.0–36.0)
MCV: 96.1 fL (ref 80.0–100.0)
Platelets: 254 10*3/uL (ref 150–400)
RBC: 2.82 MIL/uL — ABNORMAL LOW (ref 4.22–5.81)
RDW: 15.6 % — ABNORMAL HIGH (ref 11.5–15.5)
WBC: 6.1 10*3/uL (ref 4.0–10.5)
nRBC: 0 % (ref 0.0–0.2)

## 2020-05-08 LAB — CULTURE, BLOOD (ROUTINE X 2)
Culture: NO GROWTH
Culture: NO GROWTH
Special Requests: ADEQUATE

## 2020-05-08 LAB — GLUCOSE, CAPILLARY
Glucose-Capillary: 119 mg/dL — ABNORMAL HIGH (ref 70–99)
Glucose-Capillary: 125 mg/dL — ABNORMAL HIGH (ref 70–99)
Glucose-Capillary: 134 mg/dL — ABNORMAL HIGH (ref 70–99)
Glucose-Capillary: 134 mg/dL — ABNORMAL HIGH (ref 70–99)
Glucose-Capillary: 143 mg/dL — ABNORMAL HIGH (ref 70–99)
Glucose-Capillary: 168 mg/dL — ABNORMAL HIGH (ref 70–99)

## 2020-05-08 LAB — BASIC METABOLIC PANEL
Anion gap: 9 (ref 5–15)
BUN: 29 mg/dL — ABNORMAL HIGH (ref 8–23)
CO2: 25 mmol/L (ref 22–32)
Calcium: 8.9 mg/dL (ref 8.9–10.3)
Chloride: 106 mmol/L (ref 98–111)
Creatinine, Ser: 1.13 mg/dL (ref 0.61–1.24)
GFR calc Af Amer: >60 mL/min (ref >60)
GFR calc non Af Amer: >60 mL/min (ref >60)
Glucose, Bld: 134 mg/dL — ABNORMAL HIGH (ref 70–99)
Potassium: 3.7 mmol/L (ref 3.5–5.1)
Sodium: 140 mmol/L (ref 135–145)

## 2020-05-08 LAB — MAGNESIUM: Magnesium: 1.7 mg/dL (ref 1.7–2.4)

## 2020-05-08 LAB — HEMOGLOBIN AND HEMATOCRIT, BLOOD
HCT: 28.1 % — ABNORMAL LOW (ref 39.0–52.0)
Hemoglobin: 8.6 g/dL — ABNORMAL LOW (ref 13.0–17.0)

## 2020-05-08 MED ORDER — HEPARIN (PORCINE) 25000 UT/250ML-% IV SOLN
1550.0000 [IU]/h | INTRAVENOUS | Status: DC
  Administered 2020-05-08: 1550 [IU]/h via INTRAVENOUS
  Filled 2020-05-08: qty 250

## 2020-05-08 MED ORDER — SODIUM CHLORIDE 0.9% FLUSH
10.0000 mL | Freq: Two times a day (BID) | INTRAVENOUS | Status: DC
  Administered 2020-05-08 – 2020-05-16 (×15): 10 mL

## 2020-05-08 MED ORDER — IOHEXOL 350 MG/ML SOLN
100.0000 mL | Freq: Once | INTRAVENOUS | Status: AC | PRN
  Administered 2020-05-08: 100 mL via INTRAVENOUS

## 2020-05-08 MED ORDER — SODIUM CHLORIDE 0.9% FLUSH: 10.0000 mL | INTRAVENOUS | Status: DC | PRN

## 2020-05-08 NOTE — Progress Notes (Signed)
Cooling blanket turned off at 0315. Temp regulated and WDL. Continue to monitor.

## 2020-05-08 NOTE — Evaluation (Signed)
Physical Therapy Evaluation Patient Details Name: Connor Bryant MRN: [id number] DOB: 1946/01/07 Today's Date: 05/08/2020   History of Present Illness  Connor Bryant is a 74 y.o. male with medical history significant for recent iliac stents, diabetes mellitus, hypertension.Patient presented to the ED today with complaints of losing vision in his left eye, started yesterday.  Patient woke up yesterday- Saturday morning with this, and this persisted today.  Reports it feels like a dark shadow on the peripheral side of his left eye. He also reports some tingling in his left arm-this has resolved.  No strokes.  He denies weakness of his extremities.  No change in speech, no facial asymmetry.  Patient took his aspirin and Plavix and statins today.Patient had bilateral iliac stents, 2 on the right, one on the left placed about 5 days ago at [facility]. The only cardiac history patient is aware of is a cardiac murmur.  He is not aware of history of irregular heart rhythm.  Clinical Impression  Patient presents upright in chair with his son present in room. Patient with verbalized weakness in LEs, but no apparent asymmetry from LT to RT. Patient does note ongoing visual disturbance on LT side. Patient able to perform sit to stand from chair Mod I, but required several attempts. Patient with verbalized fatigue upon standing, but no visible sway or LOB using RW for support. Pateint able to ambulate to room entry and back to chair using RW and CG assist. Patient did require verbal cues for approximating chair before sitting for safety. Patient vitals remained consistent throughout treatment, with SPo2 at 97% on room air. Patient with noted fatigue post ambulation. Discussed DC options with patient and his son, for home health with supervision vs SNF. Patients son stated he can be available daily to assist as needed, and home health would be beneficial. Patient and his son agree with this plan. Patient  left upright in chair with phone and call bell in reach, his son present in room, nursing notified of mobility status. Patient will benefit from continued physical therapy in hospital and recommended venue below to increase strength, balance, endurance for safe ADLs and gait.       Follow Up Recommendations Home health PT;Supervision/Assistance - 24 hour;Supervision for mobility/OOB    Equipment Recommendations  Rolling walker with 5" wheels (toilet riser)    Recommendations for Other Services       Precautions / Restrictions Precautions Precautions: Fall Restrictions Weight Bearing Restrictions: No      Mobility  Bed Mobility                  Transfers Overall transfer level: Modified independent                  Ambulation/Gait Ambulation/Gait assistance: Supervision;Min guard Gait Distance (Feet): 15 Feet Assistive device: Rolling walker (2 wheeled) Gait Pattern/deviations: Trunk flexed;Decreased step length - right;Decreased step length - left     General Gait Details: small step gait  Stairs            Wheelchair Mobility    Modified Rankin (Stroke Patients Only)       Balance Overall balance assessment: Modified Independent                                           Pertinent Vitals/Pain Pain Assessment: No/denies pain  Home Living Family/patient expects to be discharged to:: Private residence Living Arrangements: Spouse/significant other (Son is available to assist as needed daily) Available Help at Discharge: Family;Available PRN/intermittently Type of Home: House Home Access: Stairs to enter Entrance Stairs-Rails: None Entrance Stairs-Number of Steps: 1 Home Layout: One level Home Equipment: Cane - single point      Prior Function Level of Independence: Independent               Hand Dominance        Extremity/Trunk Assessment   Upper Extremity Assessment Upper Extremity Assessment:  Overall WFL for tasks assessed    Lower Extremity Assessment Lower Extremity Assessment: Generalized weakness       Communication   Communication: No difficulties  Cognition Arousal/Alertness: Awake/alert Behavior During Therapy: WFL for tasks assessed/performed Overall Cognitive Status: Within Functional Limits for tasks assessed                                        General Comments General comments (skin integrity, edema, etc.): good seated balance, requires RW for support with standing balance    Exercises     Assessment/Plan    PT Assessment Patient needs continued PT services  PT Problem List         PT Treatment Interventions DME instruction;Gait training;Balance training;Functional mobility training;Therapeutic activities;Patient/family education;Therapeutic exercise    PT Goals (Current goals can be found in the Care Plan section)  Acute Rehab PT Goals Patient Stated Goal: Return home PT Goal Formulation: With patient/family Time For Goal Achievement: 05/22/20 Potential to Achieve Goals: Good    Frequency Min 3X/week   Barriers to discharge        Co-evaluation               AM-PAC PT "6 Clicks" Mobility  Outcome Measure Help needed turning from your back to your side while in a flat bed without using bedrails?: A Little Help needed moving from lying on your back to sitting on the side of a flat bed without using bedrails?: A Little Help needed moving to and from a bed to a chair (including a wheelchair)?: A Little Help needed standing up from a chair using your arms (e.g., wheelchair or bedside chair)?: A Little Help needed to walk in hospital room?: A Little Help needed climbing 3-5 steps with a railing? : A Lot 6 Click Score: 17    End of Session Equipment Utilized During Treatment: Gait belt Activity Tolerance: Patient tolerated treatment well;Patient limited by fatigue Patient left: with call bell/phone within reach;in  chair;with family/visitor present Nurse Communication: Mobility status PT Visit Diagnosis: Unsteadiness on feet (R26.81);Other abnormalities of gait and mobility (R26.89);Muscle weakness (generalized) (M62.81)    Time: 7209-4709 PT Time Calculation (min) (ACUTE ONLY): 37 min   Charges:   PT Evaluation $PT Eval Low Complexity: 1 Low          3:00 PM, 05/08/20 Josue Hector PT DPT  Physical Therapist with [facility]  [phone]

## 2020-05-08 NOTE — Progress Notes (Signed)
PROGRESS NOTE    Connor Bryant  PRX:[id number] DOB: Mar 17, 1946 DOA: 05/01/2020 PCP: Claretta Fraise, MD   Brief Narrative:  MQK:MMNOTR C Estesis a 74 y.o.malewith medical history significant forrecent iliac stents, diabetes mellitus, hypertension. Patient presented to the ED today with complaints of losing vision in his left eye,started yesterday.Patient woke up yesterday-Saturday morning with this, and this persisted today. Reports it feels like a dark shadow on the peripheral side ofhis left eye. He also reports some tingling in his left arm-this has resolved. No strokes. He denies weakness of his extremities. No change in speech, no facial asymmetry. Patient took his aspirin and Plavix and statins today. Patient had bilateral iliac stents,2 on the right, one on the left placed about 5 days ago at [facility].  The only cardiac history patient is aware of is a cardiac murmur. He is not aware of history of irregular heart rhythm.  ED Course:Blood pressure systolic 1 1 5-1 2 7, heart rate 49-59,hemoglobin 8.6,recent baseline about 11.Creatinine 1.7, about baseline. Head CT shows acute right occipital infarct. EKG showing atrial fibrillation. Telemetry neurology consulted-patient outside TPA window, stroke work-up.Dualantiplatelet for now. Hospitalist to admit for acute CVA.  8/11:Patient patient admitted with lateral vision loss to left eye with noted acute right-sided CVA. Overnight he has developed acute hypoxemic respiratory failure and required intubation on mechanical ventilator. He has been noted to have troponin elevation suggestive of NSTEMI and has been started on heparin drip. Cardiology following as well as pulmonology for vent management. There is suspicion he may have also aspirated with potential for HCAP.  8/12:Patient noted to have worsening LV dysfunction on 2D echocardiogram. Discussed with family yesterday and elected for  patient to be DNR. He was also noted to have atrial fibrillation with RVR overnight for which Cardizem was started. Discontinued by cardiology today and started on metoprolol. Planning to wean ventilator today if possible. Continues on cefepime. Continue Lasix for diuresis.  8/13:Patient noted to have some fevers overnight which have improved this a.m. Blood cultures with no growth noted thus far. Creatinine levels are improving and 2 L fluid output noted with Lasix. He was noted to have some A. fib with RVR overnight which resolved with IV metoprolol. Metoprolol dose increased by cardiology with redosing of IV Lasix today. Renal ultrasound with no acute findings and hematuria is noted to be clearing. Plan to wean off vent today.  8/14: Patient weaned off vent on 8/13.  Noted to remain stable overnight with no further bleeding noted.  Appears to be tolerating diet this morning.  Appears euvolemic on 3 L nasal cannula oxygen with dark urine output noted.  8/15: Patient continues to do well this morning.  Plan to obtain CT chest with contrast to rule out possibility of PE.  Currently on 1.5 L.  He appears to be eating well.  Assessment & Plan:   Principal Problem:   Acute CVA (cerebrovascular accident) ([place]) Active Problems:   Peripheral vascular disease ([place])   Gout   Essential hypertension   Type 2 diabetes mellitus without complication, without long-term current use of insulin (HCC)   Pure hypercholesterolemia   S/P insertion of iliac artery stent   Unspecified atrial fibrillation (HCC)   Elevated troponin   Fever   Positive D dimer   Acute respiratory failure with hypoxia ([place])   Endotracheally intubated   Acute hypoxemic respiratory failure status post intubation 8/10-status post extubation -Likely with some component of pulmonary edema as well as aspiration  with concern for HCAP -No further need for Lasix at this point -DuoNeb for potential component of air trapping  with probable COPD -Continue on cefepime for coverage of potential HCAP, vancomycin discontinued as MRSA negative -Appreciate ongoing pulmonology recommendations by 8/16 -D dimer elevatedpreviously.  Wells score with low to moderate risk, and he appears to have elevation that is greater than age-adjusted cut off.  Plan to pursue CT chest angiogram to rule out PE today -Patient currently on 1.5 L nasal cannula oxygen with ventilator weaned on 8/13  NSTEMI -Heparindiscontinued due to ecchymosis of her groin region -Continue on Plavix and aspirin with no further Eliquis at this time -Continue atorvastatin -Beta-blocker to resume once blood pressure stabilizes -Limited 2D echocardiogramwith LVEF 35% and apical akinesis. Not a candidate for emergent catheterization at this time. May reflect stress-induced cardiomyopathy versus MI.Cardiology to reconsider catheterization by 8/16 -IV Lasix to be held for now -No need for immediate catheterization, continue to follow per cardiologywith metoprolol initiated today -Troponin downward trending with peak of 2500 -Plan for possible cardiac catheterization by 8/16, will keep n.p.o. after midnight  Acute right occipital CVA with vision loss to left eye -LDL 54 -A1c 6.2% -He was started on Eliquis with recommendation to continue Plavix by his vascular surgeon Dr. Rosana Hoes at [facility] will need 30-day Holter monitoring in outpatient setting -Appreciate neurology recommendations  Stage III CKD with mild renal insufficiency -Continue to monitor with ongoing diuresis -Baseline creatinine 1.2-1.3 -Creatinine stable at1.13  Probable paroxysmal atrial fibrillation -Likely to require outpatient 30-day monitor for further evaluation given acute CVA -Heparin drip held for now due to ecchymosis of her groin region -Elevated heart rates noted overnight on 8/12. Started on Lopressor 25 mg 3 times daily by cardiology, this has been increased  to 37.5 mg 3 times daily today given elevated heart rates overnight. -IV metoprolol on board for heart rate elevations as needed  Anemia-chronic iron deficiency anemia noted-downtrending -Improved after 1 unit PRBC transfusion -Plan to follow CBC -No overt bleeding currently noted  Hematuria-improved -Heparin drip discontinued 8/13 -Could be related to traumatic Foley insertion -No significant findings on renal ultrasound  Diabetes, non-insulin-dependent with nephropathy -Holding home oral Metformin and pioglitazone -Continue SSI with stable blood glucose readings noted  History of COPD-Long history of tobacco abuse -We will need outpatient pulmonology follow-up for PFTs -Continue on duo nebs every 6 hours  PVD -Status post mesenteric artery bypass with recent bilateral iliac stenting at [facility] 04/27/2020 -Plan to continue on Eliquis and Plavix outpatient and hold aspirin, per discussion with vascular surgeon at [facility] and once stable -Currently with bruising over the right groin region with pressure dressing applied. Anticoagulation held. -Pressure dressing removed 8/14 and bruising appears to be improving.  Hypertension-stable -Patient on metoprolol per cardiology -Further diuresis held for now  DVT prophylaxis:Heparin dripdiscontinued 8/12, SCDs Code Status:Full code Family Communication:Discussed with son 8/14 Disposition Plan: Status is: Inpatient  Remains inpatient appropriate because:Ongoing diagnostic testing needed not appropriate for outpatient work up, IV treatments appropriate due to intensity of illness or inability to take PO and Inpatient level of care appropriate due to severity of illness   Dispo: The patient is from:Home Anticipated d/c is to:SNF Anticipated d/c date is: 2-3 days Patient currently is not medically stable to d/c.May require transfer for cardiac catheterization by  8/16.  Consultants:  Cardiology  Neurology  Pulmonology  Procedures:  See below  Antimicrobials:  Anti-infectives (From admission, onward)   Start  Dose/Rate Route Frequency Ordered Stop   05/04/20 2200  vancomycin (VANCOREADY) IVPB 1250 mg/250 mL  Status:  Discontinued        1,250 mg 166.7 mL/hr over 90 Minutes Intravenous Every 24 hours 05/03/20 2335 05/04/20 0118   05/04/20 1400  ceFEPIme (MAXIPIME) 2 g in sodium chloride 0.9 % 100 mL IVPB     Discontinue     2 g 200 mL/hr over 30 Minutes Intravenous Every 12 hours 05/04/20 1348     05/04/20 1000  ceFEPIme (MAXIPIME) 2 g in sodium chloride 0.9 % 100 mL IVPB  Status:  Discontinued        2 g 200 mL/hr over 30 Minutes Intravenous Every 12 hours 05/03/20 2335 05/04/20 0942   05/03/20 2315  vancomycin (VANCOREADY) IVPB 2000 mg/400 mL        2,000 mg 200 mL/hr over 120 Minutes Intravenous  Once 05/03/20 2303 05/04/20 0153   05/03/20 2315  ceFEPIme (MAXIPIME) 2 g in sodium chloride 0.9 % 100 mL IVPB  Status:  Discontinued        2 g 200 mL/hr over 30 Minutes Intravenous  Once 05/03/20 2303 05/04/20 0118       Subjective: Patient seen and evaluated today with no new acute complaints or concerns. No acute concerns or events noted overnight. Noted to have some fever overnight.  Objective: Vitals:   05/08/20 0830 05/08/20 0845 05/08/20 0900 05/08/20 0915  BP:   (!) 156/54   Pulse: 88 91 91 91  Resp: (!) _0 Temp: 100 F (37.8 C) 100.2 F (37.9 C) 100 F (37.8 C) 100 F (37.8 C)  TempSrc:      SpO2: 95% 100% 98% 95%  Weight:      Height:        Intake/Output Summary (Last 24 hours) at 05/08/2020 1019 Last data filed at 05/08/2020 0925 Gross per 24 hour  Intake 1220 ml  Output 1950 ml  Net -730 ml   Filed Weights   05/06/20 0530 05/07/20 0414 05/08/20 0500  Weight: 98.5 kg 102.3 kg 99.8 kg    Examination:  General exam: Appears calm and comfortable  Respiratory system: Clear to  auscultation. Respiratory effort normal.  Currently on 1.5 L nasal cannula oxygen Cardiovascular system: S1 & S2 heard, RRR.  Gastrointestinal system: Abdomen is nondistended, soft and nontender.  Ecchymosis to right groin improving Central nervous system: Alert and oriented. No focal neurological deficits. Extremities: No edema Skin: No rashes, lesions or ulcers Psychiatry: Judgement and insight appear normal. Mood & affect appropriate.     Data Reviewed: I have personally reviewed following labs and imaging studies  CBC: Recent Labs  Lab 05/01/20 1702 05/02/20 0400 05/04/20 0407 05/04/20 1948 05/05/20 0319 05/05/20 0752 05/06/20 0400 05/07/20 0653 05/08/20 0530  WBC 7.8   < > 9.2  --  7.0  --  5.8 6.5 6.1  NEUTROABS 5.9  --   --   --   --   --   --   --   --   HGB 8.6*   < > 9.2*   < > 9.2* 10.2* 9.9* 10.1* 8.4*  HCT 26.5*   < > 31.8*   < > 29.6* 33.9* 32.5* 33.1* 27.1*  MCV 96.4   < > 103.6*  --  97.7  --  98.2 96.8 96.1  PLT 211   < > 177  --  212  --  213 255 254   < > = values  in this interval not displayed.   Basic Metabolic Panel: Recent Labs  Lab 05/03/20 0445 05/03/20 0445 05/03/20 2133 05/03/20 2133 05/04/20 0407 05/05/20 0319 05/06/20 0400 05/07/20 0653 05/08/20 0530  NA 140   < > 139   < > 141 141 143 142 140  K 3.9   < > 4.0   < > 4.2 4.2 3.8 3.2* 3.7  CL 109   < > 107   < > 108 111 107 103 106  CO2 22   < > 19*   < > 19* 20* 21* 25 25  GLUCOSE 121*   < > 283*   < > 134* 143* 129* 119* 134*  BUN 27*   < > 25*   < > 26* 25* 24* 29* 29*  CREATININE 1.34*   < > 1.59*   < > 1.56* 1.51* 1.30* 1.29* 1.13  CALCIUM 8.5*   < > 8.4*   < > 8.5* 8.4* 8.7* 9.2 8.9  MG  --   --  1.8  --   --  1.8 1.8 1.8 1.7  PHOS 3.7  --  6.4*  --   --   --   --   --   --    < > = values in this interval not displayed.   GFR: Estimated Creatinine Clearance: 64.7 mL/min (by C-G formula based on SCr of 1.13 mg/dL). Liver Function Tests: Recent Labs  Lab 05/01/20 1702  05/03/20 0445 05/03/20 2133 05/05/20 0319  AST 23  --  24 41  ALT 20  --  18 19  ALKPHOS 60  --  70 55  BILITOT 1.0  --  1.3* 1.4*  PROT 7.2  --  7.0 7.0  ALBUMIN 3.7 3.1* 3.5 3.2*   No results for input(s): LIPASE, AMYLASE in the last 168 hours. No results for input(s): AMMONIA in the last 168 hours. Coagulation Profile: Recent Labs  Lab 05/01/20 1702 05/03/20 2133  INR 1.1 1.0   Cardiac Enzymes: No results for input(s): CKTOTAL, CKMB, CKMBINDEX, TROPONINI in the last 168 hours. BNP (last 3 results) No results for input(s): PROBNP in the last 8760 hours. HbA1C: No results for input(s): HGBA1C in the last 72 hours. CBG: Recent Labs  Lab 05/07/20 1652 05/07/20 2029 05/07/20 2353 05/08/20 0330 05/08/20 0729  GLUCAP 133* 129* 150* 134* 119*   Lipid Profile: Recent Labs    05/06/20 2106  TRIG 73   Thyroid Function Tests: No results for input(s): TSH, T4TOTAL, FREET4, T3FREE, THYROIDAB in the last 72 hours. Anemia Panel: No results for input(s): VITAMINB12, FOLATE, FERRITIN, TIBC, IRON, RETICCTPCT in the last 72 hours. Sepsis Labs: Recent Labs  Lab 05/03/20 2132 05/03/20 2133 05/03/20 2321 05/05/20 0839 05/06/20 0400 05/07/20 0653  PROCALCITON  --  <0.10  --  0.73 0.54 0.35  LATICACIDVEN 2.9*  --  1.5  --   --   --     Recent Results (from the past 240 hour(s))  SARS Coronavirus 2 by RT PCR (hospital order, performed in [place] hospital lab) Nasopharyngeal Nasopharyngeal Swab     Status: None   Collection Time: 05/01/20  7:43 PM   Specimen: Nasopharyngeal Swab  Result Value Ref Range Status   SARS Coronavirus 2 NEGATIVE NEGATIVE Final    Comment: (NOTE) SARS-CoV-2 target nucleic acids are NOT DETECTED.  The SARS-CoV-2 RNA is generally detectable in upper and lower respiratory specimens during the acute phase of infection. The lowest concentration of SARS-CoV-2 viral copies this assay  can detect is 250 copies / mL. A negative result does not  preclude SARS-CoV-2 infection and should not be used as the sole basis for treatment or other patient management decisions.  A negative result may occur with improper specimen collection / handling, submission of specimen other than nasopharyngeal swab, presence of viral mutation(s) within the areas targeted by this assay, and inadequate number of viral copies (<250 copies / mL). A negative result must be combined with clinical observations, patient history, and epidemiological information.  Fact Sheet for Patients:   BoilerBrush.com.cy  Fact Sheet for Healthcare Providers: https://pope.com/  This test is not yet approved or  cleared by the [place] FDA and has been authorized for detection and/or diagnosis of SARS-CoV-2 by FDA under an Emergency Use Authorization (EUA).  This EUA will remain in effect (meaning this test can be used) for the duration of the COVID-19 declaration under Section 564(b)(1) of the Act, 21 U.S.C. section 360bbb-3(b)(1), unless the authorization is terminated or revoked sooner.  Performed at [facility], [address] [place], [place] [address]   MRSA PCR Screening     Status: None   Collection Time: 05/03/20  9:46 PM   Specimen: Nasal Mucosa; Nasopharyngeal  Result Value Ref Range Status   MRSA by PCR NEGATIVE NEGATIVE Final    Comment:        The GeneXpert MRSA Assay (FDA approved for NASAL specimens only), is one component of a comprehensive MRSA colonization surveillance program. It is not intended to diagnose MRSA infection nor to guide or monitor treatment for MRSA infections. Performed at [facility], [address], [place], [place] [address]   Culture, blood (x 2)     Status: None (Preliminary result)   Collection Time: 05/03/20 11:21 PM   Specimen: BLOOD  Result Value Ref Range Status   Specimen Description BLOOD  Final   Special Requests NONE  Final   Culture   Final    NO  GROWTH 4 DAYS Performed at [facility], [address] [place], [place] [address]    Report Status PENDING  Incomplete  Culture, blood (x 2)     Status: None (Preliminary result)   Collection Time: 05/03/20 11:26 PM   Specimen: BLOOD RIGHT HAND  Result Value Ref Range Status   Specimen Description BLOOD RIGHT HAND  Final   Special Requests   Final    BOTTLES DRAWN AEROBIC AND ANAEROBIC Blood Culture adequate volume   Culture   Final    NO GROWTH 4 DAYS Performed at [facility], [address], [place], [place] [address]    Report Status PENDING  Incomplete  Culture, respiratory (non-expectorated)     Status: None   Collection Time: 05/04/20  1:46 PM   Specimen: Tracheal Aspirate; Respiratory  Result Value Ref Range Status   Specimen Description   Final    TRACHEAL ASPIRATE Performed at [facility], [address], [place], [place] [address]    Special Requests   Final    NONE Performed at [facility], [address] [place], [place] [address]    Gram Stain   Final    FEW WBC PRESENT, PREDOMINANTLY PMN RARE GRAM POSITIVE COCCI RARE GRAM NEGATIVE RODS    Culture   Final    Consistent with normal respiratory flora. Performed at [facility], [address] [place], [place] [address]    Report Status 05/07/2020 FINAL  Final  Culture, blood (routine x 2)     Status:  None (Preliminary result)   Collection Time: 05/05/20  6:03 PM   Specimen: Left Antecubital; Blood  Result Value Ref Range Status   Specimen Description LEFT ANTECUBITAL  Final   Special Requests   Final    BOTTLES DRAWN AEROBIC ONLY Blood Culture adequate volume   Culture   Final    NO GROWTH 2 DAYS Performed at [facility], [address], [place], [place] [address]    Report Status PENDING  Incomplete  Culture, blood (routine x 2)     Status: None (Preliminary result)   Collection Time: 05/05/20  8:10 PM   Specimen: BLOOD LEFT ARM  Result Value Ref Range Status   Specimen Description BLOOD LEFT  ARM  Final   Special Requests   Final    BOTTLES DRAWN AEROBIC AND ANAEROBIC Blood Culture adequate volume   Culture   Final    NO GROWTH 2 DAYS Performed at [facility], [address], [place], [place] [address]    Report Status PENDING  Incomplete         Radiology Studies: No results found.      Scheduled Meds:  allopurinol  100 mg Oral BID   aspirin EC  81 mg Oral Daily   atorvastatin  80 mg Oral Daily   chlorhexidine gluconate (MEDLINE KIT)  15 mL Mouth Rinse BID   Chlorhexidine Gluconate Cloth  6 each Topical Daily   clopidogrel  75 mg Oral Daily   insulin aspart  0-5 Units Subcutaneous QHS   insulin aspart  0-9 Units Subcutaneous TID WC   ipratropium-albuterol  3 mL Nebulization Q6H   metoprolol tartrate  37.5 mg Oral TID   pantoprazole (PROTONIX) IV  40 mg Intravenous Q24H   polyethylene glycol  8.5 g Oral Daily   Continuous Infusions:  ceFEPime (MAXIPIME) IV Stopped (05/08/20 0916)     LOS: 7 days    Time spent: 35 minutes    Khalessi Blough Darleen Crocker, DO [facility]  If 7PM-7AM, please contact night-coverage [email or web address] 05/08/2020, 10:19 AM

## 2020-05-08 NOTE — Plan of Care (Signed)
°  Problem: Acute Rehab PT Goals(only PT should resolve) Goal: Pt Will Go Supine/Side To Sit Flowsheets (Taken 05/08/2020 1502) Pt will go Supine/Side to Sit: Independently Goal: Patient Will Transfer Sit To/From Stand Flowsheets (Taken 05/08/2020 1502) Patient will transfer sit to/from stand: Independently Goal: Pt Will Transfer Bed To Chair/Chair To Bed Flowsheets (Taken 05/08/2020 1502) Pt will Transfer Bed to Chair/Chair to Bed: Independently Goal: Pt Will Ambulate Flowsheets (Taken 05/08/2020 1502) Pt will Ambulate:  > 125 feet  with rolling walker  3:03 PM, 05/08/20 Josue Hector PT DPT  Physical Therapist with [facility]  [phone]

## 2020-05-08 NOTE — Progress Notes (Signed)
ANTICOAGULATION CONSULT NOTE -  Pharmacy Consult for: heparin dosing  Indication: r/o PE  No Known Allergies  Patient Measurements: Height: 5\' 8"  (172.7 cm) Weight: 99.8 kg (220 lb 0.3 oz) IBW/kg (Calculated) : 68.4 Heparin Dosing Weight: 91 kg  Vital Signs: Temp: 100.8 F (38.2 C) (08/15 1530) Temp Source: Bladder (08/15 1116) BP: 105/89 (08/15 1800) Pulse Rate: 83 (08/15 1800)  Labs: Recent Labs    05/05/20 2010 05/06/20 0400 05/06/20 0400 05/07/20 0653 05/08/20 0530  HGB  --  9.9*   < > 10.1* 8.4*  HCT  --  32.5*  --  33.1* 27.1*  PLT  --  213  --  255 254  APTT 42*  --   --   --   --   CREATININE  --  1.30*  --  1.29* 1.13   < > = values in this interval not displayed.    Estimated Creatinine Clearance: 64.7 mL/min (by C-G formula based on SCr of 1.13 mg/dL).   Assessment: Pharmacy  consulted to re-start heparin infusion for this 74 yo male for acute PE per CTA of chest. Patient was on heparin earlier this week for atrial fibrillation, but it was discontinued on 8-12 due to ecchymosis of patient's groin region.    Goal of Therapy:  Heparin level 0.3-0.7 units/ml aPTT 66-102 seconds Monitor platelets by anticoagulation protocol: Yes   Plan:  No heparin bolus d/t  hx of bleeding Re-start heparin infusion at 1550 units/hr  (rate at which pt's aPTT was therapeutic) Check anti-Xa level in 8 hours and daily while on heparin Continue to monitor H&H and platelets  Despina Pole 05/08/2020,6:38 PM

## 2020-05-09 DIAGNOSIS — I639 Cerebral infarction, unspecified: Secondary | ICD-10-CM | POA: Diagnosis not present

## 2020-05-09 DIAGNOSIS — I5021 Acute systolic (congestive) heart failure: Secondary | ICD-10-CM

## 2020-05-09 DIAGNOSIS — I214 Non-ST elevation (NSTEMI) myocardial infarction: Secondary | ICD-10-CM

## 2020-05-09 DIAGNOSIS — J9601 Acute respiratory failure with hypoxia: Secondary | ICD-10-CM | POA: Diagnosis not present

## 2020-05-09 LAB — BASIC METABOLIC PANEL
Anion gap: 11 (ref 5–15)
BUN: 27 mg/dL — ABNORMAL HIGH (ref 8–23)
CO2: 22 mmol/L (ref 22–32)
Calcium: 8.6 mg/dL — ABNORMAL LOW (ref 8.9–10.3)
Chloride: 105 mmol/L (ref 98–111)
Creatinine, Ser: 1.27 mg/dL — ABNORMAL HIGH (ref 0.61–1.24)
GFR calc Af Amer: >60 mL/min (ref >60)
GFR calc non Af Amer: 55 mL/min — ABNORMAL LOW (ref >60)
Glucose, Bld: 138 mg/dL — ABNORMAL HIGH (ref 70–99)
Potassium: 3.3 mmol/L — ABNORMAL LOW (ref 3.5–5.1)
Sodium: 138 mmol/L (ref 135–145)

## 2020-05-09 LAB — TRIGLYCERIDES: Triglycerides: 90 mg/dL (ref <150)

## 2020-05-09 LAB — CBC
HCT: 24.7 % — ABNORMAL LOW (ref 39.0–52.0)
Hemoglobin: 7.7 g/dL — ABNORMAL LOW (ref 13.0–17.0)
MCH: 30 pg (ref 26.0–34.0)
MCHC: 31.2 g/dL (ref 30.0–36.0)
MCV: 96.1 fL (ref 80.0–100.0)
Platelets: 215 10*3/uL (ref 150–400)
RBC: 2.57 MIL/uL — ABNORMAL LOW (ref 4.22–5.81)
RDW: 15.2 % (ref 11.5–15.5)
WBC: 6 10*3/uL (ref 4.0–10.5)
nRBC: 0 % (ref 0.0–0.2)

## 2020-05-09 LAB — MAGNESIUM: Magnesium: 1.8 mg/dL (ref 1.7–2.4)

## 2020-05-09 LAB — HEMOGLOBIN AND HEMATOCRIT, BLOOD
HCT: 24.6 % — ABNORMAL LOW (ref 39.0–52.0)
HCT: 23.8 % — ABNORMAL LOW (ref 39.0–52.0)
Hemoglobin: 7.7 g/dL — ABNORMAL LOW (ref 13.0–17.0)
Hemoglobin: 7.5 g/dL — ABNORMAL LOW (ref 13.0–17.0)

## 2020-05-09 LAB — GLUCOSE, CAPILLARY
Glucose-Capillary: 131 mg/dL — ABNORMAL HIGH (ref 70–99)
Glucose-Capillary: 124 mg/dL — ABNORMAL HIGH (ref 70–99)
Glucose-Capillary: 132 mg/dL — ABNORMAL HIGH (ref 70–99)
Glucose-Capillary: 138 mg/dL — ABNORMAL HIGH (ref 70–99)
Glucose-Capillary: 171 mg/dL — ABNORMAL HIGH (ref 70–99)

## 2020-05-09 LAB — HEPARIN LEVEL (UNFRACTIONATED): Heparin Unfractionated: 0.51 IU/mL (ref 0.30–0.70)

## 2020-05-09 LAB — PREPARE RBC (CROSSMATCH)

## 2020-05-09 MED ORDER — MORPHINE SULFATE (PF) 2 MG/ML IV SOLN
1.0000 mg | Freq: Once | INTRAVENOUS | Status: AC
  Administered 2020-05-09: 1 mg via INTRAVENOUS
  Filled 2020-05-09: qty 1

## 2020-05-09 MED ORDER — CARVEDILOL 3.125 MG PO TABS
6.2500 mg | ORAL_TABLET | Freq: Two times a day (BID) | ORAL | Status: DC
  Administered 2020-05-09 – 2020-05-17 (×16): 6.25 mg via ORAL
  Filled 2020-05-09 (×16): qty 2

## 2020-05-09 MED ORDER — LOSARTAN POTASSIUM 25 MG PO TABS
12.5000 mg | ORAL_TABLET | Freq: Every day | ORAL | Status: DC
  Administered 2020-05-09: 12.5 mg via ORAL
  Filled 2020-05-09: qty 1

## 2020-05-09 MED ORDER — SODIUM CHLORIDE 0.9% IV SOLUTION: Freq: Once | INTRAVENOUS | Status: DC

## 2020-05-09 MED ORDER — IPRATROPIUM-ALBUTEROL 0.5-2.5 (3) MG/3ML IN SOLN
3.0000 mL | Freq: Four times a day (QID) | RESPIRATORY_TRACT | Status: DC | PRN
  Administered 2020-05-09 – 2020-05-13 (×4): 3 mL via RESPIRATORY_TRACT
  Filled 2020-05-09 (×4): qty 3

## 2020-05-09 NOTE — Care Management Important Message (Signed)
Important Message  Patient Details  Name: Connor Bryant MRN: [id number] Date of Birth: 03/11/1946   Medicare Important Message Given:  Yes     Tommy Medal 05/09/2020, 4:37 PM

## 2020-05-09 NOTE — Progress Notes (Signed)
Patient was started on IV heparin drip yesterday due to PE, H and H was trended and hemoglobin this morning dropped to 7.7 from 8.4.  Heparin drip stopped at this time and 1 unit of PRBC ordered for transfusion.

## 2020-05-09 NOTE — Progress Notes (Signed)
Patient has been NPO since MN for possible cath procedure on 05/09/2020. Patient typed and crossed for 1 unit of blood. Heparin stopped. Blood consent signed.

## 2020-05-09 NOTE — Progress Notes (Signed)
PROGRESS NOTE    Connor Bryant  YSA:[id number] DOB: 09-May-1946 DOA: 05/01/2020 PCP: Claretta Fraise, MD   Brief Narrative:  NAT:FTDDUK C Estesis a 74 y.o.malewith medical history significant forrecent iliac stents, diabetes mellitus, hypertension. Patient presented to the ED today with complaints of losing vision in his left eye,started yesterday.Patient woke up yesterday-Saturday morning with this, and this persisted today. Reports it feels like a dark shadow on the peripheral side ofhis left eye. He also reports some tingling in his left arm-this has resolved. No strokes. He denies weakness of his extremities. No change in speech, no facial asymmetry. Patient took his aspirin and Plavix and statins today. Patient had bilateral iliac stents,2 on the right, one on the left placed about 5 days ago at [facility].  The only cardiac history patient is aware of is a cardiac murmur. He is not aware of history of irregular heart rhythm.  ED Course:Blood pressure systolic 1 1 5-1 2 7, heart rate 49-59,hemoglobin 8.6,recent baseline about 11.Creatinine 1.7, about baseline. Head CT shows acute right occipital infarct. EKG showing atrial fibrillation. Telemetry neurology consulted-patient outside TPA window, stroke work-up.Dualantiplatelet for now. Hospitalist to admit for acute CVA.  8/11:Patient patient admitted with lateral vision loss to left eye with noted acute right-sided CVA. Overnight he has developed acute hypoxemic respiratory failure and required intubation on mechanical ventilator. He has been noted to have troponin elevation suggestive of NSTEMI and has been started on heparin drip. Cardiology following as well as pulmonology for vent management. There is suspicion he may have also aspirated with potential for HCAP.  8/12:Patient noted to have worsening LV dysfunction on 2D echocardiogram. Discussed with family yesterday and elected for  patient to be DNR. He was also noted to have atrial fibrillation with RVR overnight for which Cardizem was started. Discontinued by cardiology today and started on metoprolol. Planning to wean ventilator today if possible. Continues on cefepime. Continue Lasix for diuresis.  8/13:Patient noted to have some fevers overnight which have improved this a.m. Blood cultures with no growth noted thus far. Creatinine levels are improving and 2 L fluid output noted with Lasix. He was noted to have some A. fib with RVR overnight which resolved with IV metoprolol. Metoprolol dose increased by cardiology with redosing of IV Lasix today. Renal ultrasound with no acute findings and hematuria is noted to be clearing. Plan to wean off vent today.  8/14:Patient weaned off vent on 8/13. Noted to remain stable overnight with no further bleeding noted. Appears to be tolerating diet this morning. Appears euvolemic on 3 L nasal cannula oxygen with dark urine output noted.  8/15: Patient continues to do well this morning.  Plan to obtain CT chest with contrast to rule out possibility of PE.  Currently on 1.5 L.  He appears to be eating well.  8/16: Patient currently on room air.  CT chest with contrast demonstrating right-sided PE as well as left lower lobe pneumonia.  Continue antibiotic treatment.  Heparin discontinued overnight due to worsening hemoglobin trend, but with no overt bleeding identified.  Receiving 1 unit PRBC transfusion as ordered to keep hemoglobin above 8.  Cardiology does not plan for catheterization at this time given recent CVA.  Diet has been restarted.  Assessment & Plan:   Principal Problem:   Acute CVA (cerebrovascular accident) [facility]) Active Problems:   Peripheral vascular disease ([place])   Gout   Essential hypertension   Type 2 diabetes mellitus without complication, without long-term current use  of insulin ([place])   Pure hypercholesterolemia   S/P insertion of iliac artery  stent   Unspecified atrial fibrillation (H[facility])   Elevated troponin   Fever   Positive D dimer   Acute respiratory failure with hypoxia ([facility])   Endotracheally intubated   Acute hypoxemic respiratory failure status post intubation 8/10-status post extubation -Likely multifactorial with HCAP as well as PE now noted on CT exam 8/15 -No further need for Lasix at this point -DuoNeb for potential component of air trapping with probable COPD -Continue on cefepime for coverage of potential HCAP, currently day 6, vancomycin discontinued as MRSA negative -Appreciate ongoing pulmonology recommendations by 8/16 -Patient currently on room air  PE -Noted on CT chest 8/15 -Plan to resume anticoagulation once able and hemoglobin noted to be stable  NSTEMI -Heparindiscontinued due to ecchymosis of her groin region -Continue on Plavix and aspirin with no further Eliquis at this time -Continue atorvastatin -Beta-blocker to resume once blood pressure stabilizes -Limited 2D echocardiogramwith LVEF 35% and apical akinesis. Not a candidate for emergent catheterization at this time. May reflect stress-induced cardiomyopathy versus MI.Cardiology to reconsider catheterization by 8/16 -IV Lasixto be held for now -No need for immediate catheterization, continue to follow per cardiologywith metoprolol initiated today -Troponin downward trending with peak of 2500 -Discussed with cardiology on 8/16 with plans for cardiac catheterization in the outpatient setting  Acute right occipital CVA with vision loss to left eye -LDL 54 -A1c 6.2% -He was started on Eliquis with recommendation to continue Plavix by his vascular surgeon Dr. Rosana Hoes at [place] will need 30-day Holter monitoring in outpatient setting -Appreciate neurology recommendations  Stage III CKD with mild renal insufficiency -Continue to monitor with ongoing diuresis -Baseline creatinine 1.2-1.3 -Creatinine stable  at1.13  Probable paroxysmal atrial fibrillation -Likely to require outpatient 30-day monitor for further evaluation given acute CVA -Heparin drip held for now due to ecchymosis of her groin region -Elevated heart rates noted overnight on 8/12. Started on Lopressor 25 mg 3 times daily by cardiology, this has been increased to 37.5 mg 3 times daily today given elevated heart rates overnight. -IV metoprolol on board for heart rate elevations as needed  Anemia-chronic iron deficiency anemia noted-downtrending -Improved after 1 unit PRBC transfusion earlier, repeat PRBC transfusion on 8/16 -Plan to follow CBC -No overt bleeding currently noted -May consider initiation of anticoagulation once hemoglobin noted to be stable  Hematuria-resolved -Heparin drip discontinued 8/13 -Could be related to traumatic Foley insertion -No significant findings on renal ultrasound  Diabetes, non-insulin-dependent with nephropathy -Holding home oral Metformin and pioglitazone -Continue SSI with stable blood glucose readings noted  History of COPD-Long history of tobacco abuse -We will need outpatient pulmonology follow-up for PFTs -Continue on duo nebs every 6 hours  PVD -Status post mesenteric artery bypass with recent bilateral iliac stenting at [facility] 04/27/2020 -Plan to continue on Eliquis and Plavix outpatient and hold aspirin, per discussion with vascular surgeon at [facility] and once stable -Currently with bruising over the right groin region with pressure dressing applied. Anticoagulation held. -Pressure dressing removed 8/14 and bruising appears to be improving.  Hypertension-stable -Patient on metoprolol per cardiology -Further diuresisheld for now  DVT prophylaxis:SCDs Code Status:Full code Family Communication:Discussed with son 8/16 Disposition Plan: Status is: Inpatient  Remains inpatient appropriate because:Ongoing diagnostic testing needed not appropriate  for outpatient work up, IV treatments appropriate due to intensity of illness or inability to take PO and Inpatient level of care appropriate due to severity of  illness   Dispo: The patient is from:Home Anticipated d/c is to:SNF Anticipated d/c date is: 2-3 days Patient currently is not medically stable to d/c. He needs to have hemoglobin monitoring for stability prior to resumption of anticoagulation before discharge.  Cardiac catheterization to be planned in the outpatient setting.  Consultants:  Cardiology  Neurology  Pulmonology  Procedures:  See below  Antimicrobials:  Anti-infectives (From admission, onward)   Start     Dose/Rate Route Frequency Ordered Stop   05/04/20 2200  vancomycin (VANCOREADY) IVPB 1250 mg/250 mL  Status:  Discontinued        1,250 mg 166.7 mL/hr over 90 Minutes Intravenous Every 24 hours 05/03/20 2335 05/04/20 0118   05/04/20 1400  ceFEPIme (MAXIPIME) 2 g in sodium chloride 0.9 % 100 mL IVPB     Discontinue     2 g 200 mL/hr over 30 Minutes Intravenous Every 12 hours 05/04/20 1348     05/04/20 1000  ceFEPIme (MAXIPIME) 2 g in sodium chloride 0.9 % 100 mL IVPB  Status:  Discontinued        2 g 200 mL/hr over 30 Minutes Intravenous Every 12 hours 05/03/20 2335 05/04/20 0942   05/03/20 2315  vancomycin (VANCOREADY) IVPB 2000 mg/400 mL        2,000 mg 200 mL/hr over 120 Minutes Intravenous  Once 05/03/20 2303 05/04/20 0153   05/03/20 2315  ceFEPIme (MAXIPIME) 2 g in sodium chloride 0.9 % 100 mL IVPB  Status:  Discontinued        2 g 200 mL/hr over 30 Minutes Intravenous  Once 05/03/20 2303 05/04/20 0118       Subjective: Patient seen and evaluated today with no new acute complaints or concerns.  He was noted to have downward trend of hemoglobin overnight for which heparin drip was discontinued and 1 unit PRBC transfusion was administered.  He denies any chest pain or shortness of breath this  morning.  Objective: Vitals:   05/09/20 0745 05/09/20 0752 05/09/20 0800 05/09/20 0815  BP:   (!) 149/51 (!) 144/44  Pulse: 76  79 83  Resp:    20  Temp: 99.5 F (37.5 C)  99.5 F (37.5 C) 99.5 F (37.5 C)  TempSrc:      SpO2: 91% 94% 93%   Weight:      Height:        Intake/Output Summary (Last 24 hours) at 05/09/2020 0958 Last data filed at 05/09/2020 2694 Gross per 24 hour  Intake 511.38 ml  Output 975 ml  Net -463.62 ml   Filed Weights   05/07/20 0414 05/08/20 0500 05/09/20 0500  Weight: 102.3 kg 99.8 kg 99.7 kg    Examination:  General exam: Appears calm and comfortable  Respiratory system: Clear to auscultation. Respiratory effort normal.  Currently on room air Cardiovascular system: S1 & S2 heard, RRR. Gastrointestinal system: Abdomen is soft Central nervous system: Alert and oriented. No focal neurological deficits. Extremities: Symmetric 5 x 5 power. Skin: No rashes, lesions or ulcers Psychiatry: Judgement and insight appear normal. Mood & affect appropriate.     Data Reviewed: I have personally reviewed following labs and imaging studies  CBC: Recent Labs  Lab 05/05/20 0319 05/05/20 0752 05/06/20 0400 05/06/20 0400 05/07/20 0653 05/08/20 0530 05/08/20 1926 05/09/20 0147 05/09/20 0440  WBC 7.0  --  5.8  --  6.5 6.1  --  6.0  --   HGB 9.2*   < > 9.9*   < > 10.1* 8.4*  8.6* 7.7*  7.7* 7.5*  HCT 29.6*   < > 32.5*   < > 33.1* 27.1* 28.1* 24.6*  24.7* 23.8*  MCV 97.7  --  98.2  --  96.8 96.1  --  96.1  --   PLT 212  --  213  --  255 254  --  215  --    < > = values in this interval not displayed.   Basic Metabolic Panel: Recent Labs  Lab 05/03/20 0445 05/03/20 0445 05/03/20 2133 05/04/20 0407 05/05/20 0319 05/06/20 0400 05/07/20 0653 05/08/20 0530 05/09/20 0147  NA 140   < > 139   < > 141 143 142 140 138  K 3.9   < > 4.0   < > 4.2 3.8 3.2* 3.7 3.3*  CL 109   < > 107   < > 111 107 103 106 105  CO2 22   < > 19*   < > 20* 21* _0 GLUCOSE 121*   < > 283*   < > 143* 129* 119* 134* 138*  BUN 27*   < > 25*   < > 25* 24* 29* 29* 27*  CREATININE 1.34*   < > 1.59*   < > 1.51* 1.30* 1.29* 1.13 1.27*  CALCIUM 8.5*   < > 8.4*   < > 8.4* 8.7* 9.2 8.9 8.6*  MG  --    < > 1.8  --  1.8 1.8 1.8 1.7 1.8  PHOS 3.7  --  6.4*  --   --   --   --   --   --    < > = values in this interval not displayed.   GFR: Estimated Creatinine Clearance: 57.5 mL/min (A) (by C-G formula based on SCr of 1.27 mg/dL (H)). Liver Function Tests: Recent Labs  Lab 05/03/20 0445 05/03/20 2133 05/05/20 0319  AST  --  24 41  ALT  --  18 19  ALKPHOS  --  70 55  BILITOT  --  1.3* 1.4*  PROT  --  7.0 7.0  ALBUMIN 3.1* 3.5 3.2*   No results for input(s): LIPASE, AMYLASE in the last 168 hours. No results for input(s): AMMONIA in the last 168 hours. Coagulation Profile: Recent Labs  Lab 05/03/20 2133  INR 1.0   Cardiac Enzymes: No results for input(s): CKTOTAL, CKMB, CKMBINDEX, TROPONINI in the last 168 hours. BNP (last 3 results) No results for input(s): PROBNP in the last 8760 hours. HbA1C: No results for input(s): HGBA1C in the last 72 hours. CBG: Recent Labs  Lab 05/08/20 1607 05/08/20 1955 05/08/20 2351 05/09/20 0408 05/09/20 0716  GLUCAP 134* 143* 125* 131* 124*   Lipid Profile: Recent Labs    05/06/20 2106  TRIG 73   Thyroid Function Tests: No results for input(s): TSH, T4TOTAL, FREET4, T3FREE, THYROIDAB in the last 72 hours. Anemia Panel: No results for input(s): VITAMINB12, FOLATE, FERRITIN, TIBC, IRON, RETICCTPCT in the last 72 hours. Sepsis Labs: Recent Labs  Lab 05/03/20 2132 05/03/20 2133 05/03/20 2321 05/05/20 0839 05/06/20 0400 05/07/20 0653  PROCALCITON  --  <0.10  --  0.73 0.54 0.35  LATICACIDVEN 2.9*  --  1.5  --   --   --     Recent Results (from the past 240 hour(s))  SARS Coronavirus 2 by RT PCR (hospital order, performed in [place] hospital lab) Nasopharyngeal Nasopharyngeal Swab     Status:  None   Collection Time: 05/01/20  7:43 PM  Specimen: Nasopharyngeal Swab  Result Value Ref Range Status   SARS Coronavirus 2 NEGATIVE NEGATIVE Final    Comment: (NOTE) SARS-CoV-2 target nucleic acids are NOT DETECTED.  The SARS-CoV-2 RNA is generally detectable in upper and lower respiratory specimens during the acute phase of infection. The lowest concentration of SARS-CoV-2 viral copies this assay can detect is 250 copies / mL. A negative result does not preclude SARS-CoV-2 infection and should not be used as the sole basis for treatment or other patient management decisions.  A negative result may occur with improper specimen collection / handling, submission of specimen other than nasopharyngeal swab, presence of viral mutation(s) within the areas targeted by this assay, and inadequate number of viral copies (<250 copies / mL). A negative result must be combined with clinical observations, patient history, and epidemiological information.  Fact Sheet for Patients:   StrictlyIdeas.no  Fact Sheet for Healthcare Providers: BankingDealers.co.za  This test is not yet approved or  cleared by the [place] FDA and has been authorized for detection and/or diagnosis of SARS-CoV-2 by FDA under an Emergency Use Authorization (EUA).  This EUA will remain in effect (meaning this test can be used) for the duration of the COVID-19 declaration under Section 564(b)(1) of the Act, 21 U.S.C. section 360bbb-3(b)(1), unless the authorization is terminated or revoked sooner.  Performed at [facility], [address], [place], [place] [address]   MRSA PCR Screening     Status: None   Collection Time: 05/03/20  9:46 PM   Specimen: Nasal Mucosa; Nasopharyngeal  Result Value Ref Range Status   MRSA by PCR NEGATIVE NEGATIVE Final    Comment:        The GeneXpert MRSA Assay (FDA approved for NASAL specimens only), is one component of  a comprehensive MRSA colonization surveillance program. It is not intended to diagnose MRSA infection nor to guide or monitor treatment for MRSA infections. Performed at [facility], [address], [place], [place] [address]   Culture, blood (x 2)     Status: None   Collection Time: 05/03/20 11:21 PM   Specimen: BLOOD  Result Value Ref Range Status   Specimen Description BLOOD  Final   Special Requests NONE  Final   Culture   Final    NO GROWTH 5 DAYS Performed at [facility], [address], [place], [place] [address]    Report Status 05/08/2020 FINAL  Final  Culture, blood (x 2)     Status: None   Collection Time: 05/03/20 11:26 PM   Specimen: BLOOD RIGHT HAND  Result Value Ref Range Status   Specimen Description BLOOD RIGHT HAND  Final   Special Requests   Final    BOTTLES DRAWN AEROBIC AND ANAEROBIC Blood Culture adequate volume   Culture   Final    NO GROWTH 5 DAYS Performed at [facility], [address], [place], [place] [address]    Report Status 05/08/2020 FINAL  Final  Culture, respiratory (non-expectorated)     Status: None   Collection Time: 05/04/20  1:46 PM   Specimen: Tracheal Aspirate; Respiratory  Result Value Ref Range Status   Specimen Description   Final    TRACHEAL ASPIRATE Performed at [facility], [address], [place], [place] [address]    Special Requests   Final    NONE Performed at [facility], [address], [place], [place] [address]    Gram Stain   Final    FEW WBC PRESENT, PREDOMINANTLY PMN RARE Lonell Grandchild  POSITIVE COCCI RARE GRAM NEGATIVE RODS    Culture   Final    Consistent with normal respiratory flora. Performed at [facility], [place] [address], [place], [place] [address]    Report Status 05/07/2020 FINAL  Final  Culture, blood (routine x 2)     Status: None (Preliminary result)   Collection Time: 05/05/20  6:03 PM   Specimen: Left Antecubital; Blood  Result Value Ref Range Status   Specimen Description LEFT ANTECUBITAL   Final   Special Requests   Final    BOTTLES DRAWN AEROBIC ONLY Blood Culture adequate volume   Culture   Final    NO GROWTH 4 DAYS Performed at [facility], [address], [place], [place] [address]    Report Status PENDING  Incomplete  Culture, blood (routine x 2)     Status: None (Preliminary result)   Collection Time: 05/05/20  8:10 PM   Specimen: BLOOD LEFT ARM  Result Value Ref Range Status   Specimen Description BLOOD LEFT ARM  Final   Special Requests   Final    BOTTLES DRAWN AEROBIC AND ANAEROBIC Blood Culture adequate volume   Culture   Final    NO GROWTH 4 DAYS Performed at [facility], [address] [place], [place] [address]    Report Status PENDING  Incomplete         Radiology Studies: CT ANGIO CHEST PE W OR WO CONTRAST  Addendum Date: 05/08/2020   ADDENDUM REPORT: 05/08/2020 17:52 ADDENDUM: These results were called by telephone at the time of interpretation on 05/08/2020 at 5:52 pm to provider [facility] , who verbally acknowledged these results. Electronically Signed   By: Zerita Boers M.D.   On: 05/08/2020 17:52   Result Date: 05/08/2020 CLINICAL DATA:  Concern for pulmonary embolism. EXAM: CT ANGIOGRAPHY CHEST WITH CONTRAST TECHNIQUE: Multidetector CT imaging of the chest was performed using the standard protocol during bolus administration of intravenous contrast. Multiplanar CT image reconstructions and MIPs were obtained to evaluate the vascular anatomy. CONTRAST:  128m OMNIPAQUE IOHEXOL 350 MG/ML SOLN COMPARISON:  Chest radiograph dated 05/05/2020. FINDINGS: Cardiovascular: Satisfactory opacification of the pulmonary arteries to the segmental level. A filling defect in a subsegmental pulmonary arteries supplying the right middle lobe is consistent with an acute pulmonary embolism (series 5, images 150 8-162). Significant atherosclerotic calcifications are seen in the coronary arteries and aortic arch. The heart is enlarged. No pericardial effusion.  Mediastinum/Nodes: There is mild bilateral hilar lymphadenopathy. No enlarged mediastinal or axillary lymph nodes. Thyroid gland, trachea, and esophagus demonstrate no significant findings. Lungs/Pleura: There are ground-glass opacities and consolidation in the left lower lobe near the lung base. There is mild bilateral dependent atelectasis. There is no pleural effusion or pneumothorax. Upper Abdomen: No acute abnormality. Musculoskeletal: There is a chronic appearing anterior wedge deformity of T12. Degenerative changes are seen in the spine. Review of the MIP images confirms the above findings. IMPRESSION: 1. Acute pulmonary embolism in a subsegmental pulmonary artery supplying the right middle lobe. 2. Ground-glass opacities and consolidation in the left lower lobe near the lung base likely represent pneumonia. 3. Mild bilateral hilar lymphadenopathy is likely reactive. Aortic Atherosclerosis (ICD10-I70.0). Electronically Signed: By: TZerita BoersM.D. On: 05/08/2020 17:47        Scheduled Meds: . sodium chloride   Intravenous Once  . allopurinol  100 mg Oral BID  . aspirin EC  81 mg Oral Daily  . atorvastatin  80 mg Oral Daily  .  chlorhexidine gluconate (MEDLINE KIT)  15 mL Mouth Rinse BID  . Chlorhexidine Gluconate Cloth  6 each Topical Daily  . clopidogrel  75 mg Oral Daily  . insulin aspart  0-5 Units Subcutaneous QHS  . insulin aspart  0-9 Units Subcutaneous TID WC  . metoprolol tartrate  37.5 mg Oral TID  . pantoprazole (PROTONIX) IV  40 mg Intravenous Q24H  . polyethylene glycol  8.5 g Oral Daily  . sodium chloride flush  10-40 mL Intracatheter Q12H   Continuous Infusions: . ceFEPime (MAXIPIME) IV Stopped (05/08/20 2226)     LOS: 8 days    Time spent: 35 minutes    Refugio Vandevoorde D Manuella Ghazi, DO [facility]  If 7PM-7AM, please contact night-coverage [email or web address] 05/09/2020, 9:58 AM

## 2020-05-09 NOTE — Progress Notes (Signed)
NAME:  Connor Bryant, MRN:  [id number], DOB:  15-Sep-1946, LOS: 8 ADMISSION DATE:  05/01/2020, CONSULTATION DATE:  8/11 REFERRING MD:  Shah/ Triad, CHIEF COMPLAINT:  resp distress    Brief History   74 yo male former smoker presented to ER with loss of vision in Lt eye and tingling in Lt arm.  Found to have acute infarct in Rt occipital lobe, but was outside window for tPA or thrombectomy.  Also found to have new onset atrial fibrillation.  Developed hypoxia with SpO2 of 52% on 05/03/20.  Required intubation and transferred to ICU.  Past Medical History  PAD s/p bilateral iliac stents, HTN, DM, CKD 3a, Iron deficiency anemia  Significant Hospital Events   8/08 admit 8/10 hypoxia, altered mental status, fever, transfer to ICU 8/11 cardiology consulted 8/13 extubated  Consults:  Neurology Cardiology  Procedures:  ETT  8/10 >> 8/13   Significant Diagnostic Tests:   CT head 05/01/20 >> acute Rt occipital infarct  Echo 05/02/20 >> EF 60 o 65%, mild LVH, normal RV  Echo 05/04/20 >> EF 35% with Takotsubo pattern  CT angio chest 05/08/20 >> subsegmental PE RML, b/l Lt lower lobe GGO and ATX  Micro Data:  COVID 8/08 >> negative MRSA PCR 8/10 >> negative Blood 8/10 >> negative Sputum 8/11 >> oral flora Blood 8/12 >>   Antimicrobials:  Cefepime 8/10 >>   Interim history/subjective:  Has occasional cough.  Not having wheeze or chest pain.  On room air now.  Doesn't feel like he has issue with snoring, but he sleeps in a separate room from his wife so isn't sure.  He isn't sure how much inhaler therapy helps, but does cough up phlegm after getting a treatment.  Objective   Blood pressure (!) 127/55, pulse 68, temperature 99.1 F (37.3 C), resp. rate 20, height 5\' 8"  (1.727 m), weight 99.7 kg, SpO2 100 %.    FiO2 (%):  [21 %] 21 %   Intake/Output Summary (Last 24 hours) at 05/09/2020 1455 Last data filed at 05/09/2020 6546 Gross per 24 hour  Intake 511.38 ml  Output 975 ml  Net  -463.62 ml   Filed Weights   05/07/20 0414 05/08/20 0500 05/09/20 0500  Weight: 102.3 kg 99.8 kg 99.7 kg    Examination:  General - alert Eyes - pupils reactive ENT - no sinus tenderness, no stridor Cardiac - regular rate/rhythm, no murmur Chest - equal breath sounds b/l, no wheezing or rales Abdomen - soft, non tender, + bowel sounds Extremities - no cyanosis, clubbing, or edema Skin - no rashes Neuro - normal strength, moves extremities, follows commands Psych - normal mood and behavior   Resolved Hospital Problem list      Assessment & Plan:   Acute hypoxic respiratory failure from acute pulmonary edema and acute pulmonary embolism. Hx of tobacco abuse with presumed COPD. - goal SpO2 > 92% - prn duoneb - assess for PFT as outpt through PCP - bronchial hygiene - assess for sleep study to determine presence of obstructive sleep apnea as outpt given comorbid conditions  Acute RML subsegmental PE. - anticoagulation per hospitalist  Acute Rt occipital CVA. - continue ASA  NSTEMI. Acute systolic CHF with Takotsubo pattern. New onset PAF, present on admission. Hx of HTN, HLD, PAD s/p bilateral iliac stents. - cardiology assessing for cardiac catheterization - continue coreg, lipitor, plavix, cozaar  CKD 3a. Hypokalemia. - f/u BMET  Anemia of iron deficiency and critical illness. - f/u CBC -  transfuse for Hb < 7 or significant bleeding  Fever. - Abx per primary team  DM type II poorly controlled with hyperglycemia. - SSI  Best practice:  Diet: Soft diet DVT prophylaxis: heparin gtt GI prophylaxis: protonix Mobility: oob to chair Code Status: DNR Family Communication: updated pt's sister at bedside (she is retired Statistician) Disposition: ICU   PCCM will sign off.  Please call if additional help needed while he is in hospital.  Labs    CMP Latest Ref Rng & Units 05/09/2020 05/08/2020 05/07/2020  Glucose 70 - 99 mg/dL 138(H) 134(H) 119(H)    BUN 8 - 23 mg/dL 27(H) 29(H) 29(H)  Creatinine 0.61 - 1.24 mg/dL 1.27(H) 1.13 1.29(H)  Sodium 135 - 145 mmol/L 138 140 142  Potassium 3.5 - 5.1 mmol/L 3.3(L) 3.7 3.2(L)  Chloride 98 - 111 mmol/L 105 106 103  CO2 22 - 32 mmol/L 22 25 25   Calcium 8.9 - 10.3 mg/dL 8.6(L) 8.9 9.2  Total Protein 6.5 - 8.1 g/dL - - -  Total Bilirubin 0.3 - 1.2 mg/dL - - -  Alkaline Phos 38 - 126 U/L - - -  AST 15 - 41 U/L - - -  ALT 0 - 44 U/L - - -    CBC Latest Ref Rng & Units 05/09/2020 05/09/2020 05/09/2020  WBC 4.0 - 10.5 K/uL - - 6.0  Hemoglobin 13.0 - 17.0 g/dL 7.5(L) 7.7(L) 7.7(L)  Hematocrit 39 - 52 % 23.8(L) 24.6(L) 24.7(L)  Platelets 150 - 400 K/uL - - 215    ABG    Component Value Date/Time   PHART 7.375 05/04/2020 0530   PCO2ART 37.3 05/04/2020 0530   PO2ART 99.0 05/04/2020 0530   HCO3 22.0 05/04/2020 0530   TCO2 26 03/11/2014 0432   ACIDBASEDEF 3.1 (H) 05/04/2020 0530   O2SAT 95.5 05/04/2020 0530    CBG (last 3)  Recent Labs    05/09/20 0408 05/09/20 0716 05/09/20 1140  GLUCAP 131* 124* 132*    Signature:  Chesley Mires, MD [place] Pager - [phone] 05/09/2020, 3:43 PM

## 2020-05-09 NOTE — Progress Notes (Signed)
RN noticed drop in SPO2 on monitor and responded to pt room . Pt had pale coloration and apparent difficulty breathing O2 by NRBMask initiated and RT notified. Breathing treatment given and BiPAP started.

## 2020-05-09 NOTE — Progress Notes (Signed)
Progress Note  Patient Name: Connor Bryant Date of Encounter: 05/09/2020  Primary Cardiologist: Carlyle Dolly, MD  Subjective   Patient reports no active chest pain this morning, breathing generally better. No palpitations.  Feels sore all over.  Also very weak.  Inpatient Medications    Scheduled Meds:  sodium chloride   Intravenous Once   allopurinol  100 mg Oral BID   aspirin EC  81 mg Oral Daily   atorvastatin  80 mg Oral Daily   chlorhexidine gluconate (MEDLINE KIT)  15 mL Mouth Rinse BID   Chlorhexidine Gluconate Cloth  6 each Topical Daily   clopidogrel  75 mg Oral Daily   insulin aspart  0-5 Units Subcutaneous QHS   insulin aspart  0-9 Units Subcutaneous TID WC   metoprolol tartrate  37.5 mg Oral TID   pantoprazole (PROTONIX) IV  40 mg Intravenous Q24H   polyethylene glycol  8.5 g Oral Daily   sodium chloride flush  10-40 mL Intracatheter Q12H   Continuous Infusions:  ceFEPime (MAXIPIME) IV Stopped (05/08/20 2226)   PRN Meds: acetaminophen **OR** acetaminophen, fentaNYL (SUBLIMAZE) injection, hydrALAZINE, ipratropium-albuterol, metoprolol tartrate, sodium chloride flush   Vital Signs    Vitals:   05/09/20 0745 05/09/20 0752 05/09/20 0800 05/09/20 0815  BP:   (!) 149/51 (!) 144/44  Pulse: 76  79 83  Resp:    20  Temp: 99.5 F (37.5 C)  99.5 F (37.5 C) 99.5 F (37.5 C)  TempSrc:      SpO2: 91% 94% 93%   Weight:      Height:        Intake/Output Summary (Last 24 hours) at 05/09/2020 0956 Last data filed at 05/09/2020 0812 Gross per 24 hour  Intake 511.38 ml  Output 975 ml  Net -463.62 ml   Filed Weights   05/07/20 0414 05/08/20 0500 05/09/20 0500  Weight: 102.3 kg 99.8 kg 99.7 kg    Telemetry    Sinus rhythm.  Personally reviewed.  ECG    An ECG dated 05/06/2020 was personally reviewed today and demonstrated:  Sinus rhythm with diffuse ST T wave abnormalities.  Physical Exam   GEN:  Elderly male.  No acute distress.     Neck: No JVD. Cardiac: RRR, 3/6 basal systolic murmur, no gallop.  Respiratory: Nonlabored.  Few rhonchi. GI: Soft, nontender, bowel sounds present. MS: No edema; No deformity. Neuro:  Nonfocal. Psych: Alert and oriented x 3. Normal affect.  Labs    Chemistry Recent Labs  Lab 05/03/20 0445 05/03/20 0445 05/03/20 2133 05/04/20 0407 05/05/20 0319 05/06/20 0400 05/07/20 0653 05/08/20 0530 05/09/20 0147  NA 140   < > 139   < > 141   < > 142 140 138  K 3.9   < > 4.0   < > 4.2   < > 3.2* 3.7 3.3*  CL 109   < > 107   < > 111   < > 103 106 105  CO2 22   < > 19*   < > 20*   < > _0 GLUCOSE 121*   < > 283*   < > 143*   < > 119* 134* 138*  BUN 27*   < > 25*   < > 25*   < > 29* 29* 27*  CREATININE 1.34*   < > 1.59*   < > 1.51*   < > 1.29* 1.13 1.27*  CALCIUM 8.5*   < > 8.4*   < >  8.4*   < > 9.2 8.9 8.6*  PROT  --   --  7.0  --  7.0  --   --   --   --   ALBUMIN 3.1*  --  3.5  --  3.2*  --   --   --   --   AST  --   --  24  --  41  --   --   --   --   ALT  --   --  18  --  19  --   --   --   --   ALKPHOS  --   --  70  --  55  --   --   --   --   BILITOT  --   --  1.3*  --  1.4*  --   --   --   --   GFRNONAA 51*   < > 42*   < >  --    < > 54* >60 55*  GFRAA 60*   < > 48*   < >  --    < > >60 >60 >60  ANIONGAP 9   < > 13   < > 10.0   < > _0 < > = values in this interval not displayed.     Hematology Recent Labs  Lab 05/07/20 0653 05/07/20 0653 05/08/20 0530 05/08/20 0530 05/08/20 1926 05/09/20 0147 05/09/20 0440  WBC 6.5  --  6.1  --   --  6.0  --   RBC 3.42*  --  2.82*  --   --  2.57*  --   HGB 10.1*   < > 8.4*   < > 8.6* 7.7*   7.7* 7.5*  HCT 33.1*   < > 27.1*   < > 28.1* 24.6*   24.7* 23.8*  MCV 96.8  --  96.1  --   --  96.1  --   MCH 29.5  --  29.8  --   --  30.0  --   MCHC 30.5  --  31.0  --   --  31.2  --   RDW 15.6*  --  15.6*  --   --  15.2  --   PLT 255  --  254  --   --  215  --    < > = values in this interval not displayed.    Cardiac  Enzymes Recent Labs  Lab 05/04/20 0442 05/04/20 1622 05/04/20 1948 05/05/20 0319 05/05/20 0752  TROPONINIHS 2,324* 2,506* 2,499* 2,354* 540*    BNP Recent Labs  Lab 05/03/20 2133 05/05/20 0319 05/07/20 0653  BNP 1,082.0* 764.0* 487.0*     DDimer Recent Labs  Lab 05/03/20 2133  DDIMER 3.83*     Radiology    CT ANGIO CHEST PE W OR WO CONTRAST  Addendum Date: 05/08/2020   ADDENDUM REPORT: 05/08/2020 17:52 ADDENDUM: These results were called by telephone at the time of interpretation on 05/08/2020 at 5:52 pm to provider [facility] , who verbally acknowledged these results. Electronically Signed   By: Zerita Boers M.D.   On: 05/08/2020 17:52   Result Date: 05/08/2020 CLINICAL DATA:  Concern for pulmonary embolism. EXAM: CT ANGIOGRAPHY CHEST WITH CONTRAST TECHNIQUE: Multidetector CT imaging of the chest was performed using the standard protocol during bolus administration of intravenous contrast. Multiplanar CT image reconstructions and MIPs were obtained to evaluate the vascular anatomy. CONTRAST:  166m  OMNIPAQUE IOHEXOL 350 MG/ML SOLN COMPARISON:  Chest radiograph dated 05/05/2020. FINDINGS: Cardiovascular: Satisfactory opacification of the pulmonary arteries to the segmental level. A filling defect in a subsegmental pulmonary arteries supplying the right middle lobe is consistent with an acute pulmonary embolism (series 5, images 150 8-162). Significant atherosclerotic calcifications are seen in the coronary arteries and aortic arch. The heart is enlarged. No pericardial effusion. Mediastinum/Nodes: There is mild bilateral hilar lymphadenopathy. No enlarged mediastinal or axillary lymph nodes. Thyroid gland, trachea, and esophagus demonstrate no significant findings. Lungs/Pleura: There are ground-glass opacities and consolidation in the left lower lobe near the lung base. There is mild bilateral dependent atelectasis. There is no pleural effusion or pneumothorax. Upper Abdomen: No  acute abnormality. Musculoskeletal: There is a chronic appearing anterior wedge deformity of T12. Degenerative changes are seen in the spine. Review of the MIP images confirms the above findings. IMPRESSION: 1. Acute pulmonary embolism in a subsegmental pulmonary artery supplying the right middle lobe. 2. Ground-glass opacities and consolidation in the left lower lobe near the lung base likely represent pneumonia. 3. Mild bilateral hilar lymphadenopathy is likely reactive. Aortic Atherosclerosis (ICD10-I70.0). Electronically Signed: By: Zerita Boers M.D. On: 05/08/2020 17:47    Cardiac Studies   Echocardiogram 05/02/2020: 1. Left ventricular ejection fraction, by estimation, is 60 to 65%. The  left ventricle has normal function. Left ventricular endocardial border  not optimally defined to evaluate regional wall motion. The left  ventricular internal cavity size was mildly  dilated. There is mild left ventricular hypertrophy. Left ventricular  diastolic parameters are indeterminate.  2. Right ventricular systolic function is normal. The right ventricular  size is normal.  3. The mitral valve is abnormal. Trivial mitral valve regurgitation.  4. AV is thickened, calcified with mildly restricted motion. Peak and  mean gradients through the valve are 31 and 18 mm Hg respectively. . The  aortic valve is abnormal. Aortic valve regurgitation is not visualized.   Limited echocardiogram 05/04/2020: 1. The entire apex from mid to distal ventricle is akinetic. Pattern can  be consistent with stress induced cardiomyopathy/Takotsubo CM, however  cannot exclude ischemic etiology   . Left ventricular ejection fraction, by estimation, is 35%. The left  ventricle has moderately decreased function.  2. The inferior vena cava is normal in size with greater than 50%  respiratory variability, suggesting right atrial pressure of 3 mmHg.   Patient Profile     74 y.o. male with past medical history of  PAD (s/p prior mesenteric artery bypass and recent bilateral iliac stenting at [facility] on 04/27/2020),HTN, HLD,Type 2 DMand Stage 3 CKD.  Assessment & Plan    1.  NSTEMI, peak high-sensitivity troponin I of 2506.  Occurred in the setting of presentation initially with acute stroke, then acute pulmonary edema with hypoxic respiratory failure requiring intubation.  Initial echocardiogram on August 9 showed normal LVEF at 60 to 65%, follow-up study after clinical deterioration on August 11 showed LVEF 35% with possibility of stress-induced cardiomyopathy versus ischemic etiology.  He is hemodynamically stable at this time, extubated, no active chest pain.  2.  Paroxysmal atrial fibrillation documented during hospital stay, new diagnosis.  Currently in sinus rhythm.  CHA2DS2-VASc score is 8.  None of DOAC at this time with documented hematuria, held since admission.  3.  Acute pulmonary embolus, subsegmental pulmonary artery supplying the right middle lobe, documented August 15 by chest CTA.  Placed on heparin by primary team.  4.  Acute right occipital stroke at presentation  with vision loss left eye.  5.  PAD status post mesenteric artery bypass in the past and more recently bilateral iliac stent intervention at [facility].  6.  Intermittent fevers, presently resolved.  Treated with antibiotics empirically by primary team.  7.  CKD stage 3b.  8.  Acute anemia, most recently hemoglobin down to 7.5 (started on IV heparin which had to be discontinued).  Receiving PRBC by primary team.  Overall complex situation.  I reviewed the chart, cardiology input from last week, recent clinical course.  Now is not the time to pursue a diagnostic cardiac catheterization.  He had a recent acute stroke in which case we would typically defer a cardiac catheterization in the short-term, also new diagnosis of acute pulmonary embolus, and acute anemia requiring PRBC transfusion.  Even if he were to have CAD that was  amenable to percutaneous revascularization, this would not be an optimal time to start him on dual antiplatelet therapy along with eventually a DOAC until we are better able to understand the cause of his anemia and see general stability in hemoglobin trend. Would focus on medical therapy at this time.  He is currently on aspirin, Plavix, high-dose Lipitor, and Lopressor.  Will cautiously start low-dose ARB, follow-up renal function.  Change from Lopressor to Coreg.  Case discussed with Dr. Manuella Ghazi.  Signed, Rozann Lesches, MD  05/09/2020, 9:56 AM

## 2020-05-09 NOTE — Progress Notes (Signed)
Re-paged Hospitalist. Orders received and in progress

## 2020-05-10 ENCOUNTER — Inpatient Hospital Stay (HOSPITAL_COMMUNITY): Payer: Medicare Other

## 2020-05-10 DIAGNOSIS — I639 Cerebral infarction, unspecified: Secondary | ICD-10-CM | POA: Diagnosis not present

## 2020-05-10 DIAGNOSIS — J9601 Acute respiratory failure with hypoxia: Secondary | ICD-10-CM | POA: Diagnosis not present

## 2020-05-10 LAB — CBC
HCT: 30.8 % — ABNORMAL LOW (ref 39.0–52.0)
Hemoglobin: 9.6 g/dL — ABNORMAL LOW (ref 13.0–17.0)
MCH: 29.2 pg (ref 26.0–34.0)
MCHC: 31.2 g/dL (ref 30.0–36.0)
MCV: 93.6 fL (ref 80.0–100.0)
Platelets: 270 10*3/uL (ref 150–400)
RBC: 3.29 MIL/uL — ABNORMAL LOW (ref 4.22–5.81)
RDW: 16.2 % — ABNORMAL HIGH (ref 11.5–15.5)
WBC: 8.8 10*3/uL (ref 4.0–10.5)
nRBC: 0 % (ref 0.0–0.2)

## 2020-05-10 LAB — BLOOD GAS, ARTERIAL
Acid-base deficit: 2.5 mmol/L — ABNORMAL HIGH (ref 0.0–2.0)
Bicarbonate: 21.8 mmol/L (ref 20.0–28.0)
Delivery systems: POSITIVE
Drawn by: 38235
FIO2: 100
O2 Saturation: 99.3 %
Patient temperature: 37
RATE: 20 resp/min
pCO2 arterial: 52.2 mmHg — ABNORMAL HIGH (ref 32.0–48.0)
pH, Arterial: 7.274 — ABNORMAL LOW (ref 7.350–7.450)
pO2, Arterial: 229 mmHg — ABNORMAL HIGH (ref 83.0–108.0)

## 2020-05-10 LAB — BPAM RBC
Blood Product Expiration Date: 202109172359
ISSUE DATE / TIME: 202108160751
Unit Type and Rh: 5100

## 2020-05-10 LAB — GLUCOSE, CAPILLARY
Glucose-Capillary: 124 mg/dL — ABNORMAL HIGH (ref 70–99)
Glucose-Capillary: 127 mg/dL — ABNORMAL HIGH (ref 70–99)
Glucose-Capillary: 141 mg/dL — ABNORMAL HIGH (ref 70–99)
Glucose-Capillary: 152 mg/dL — ABNORMAL HIGH (ref 70–99)
Glucose-Capillary: 156 mg/dL — ABNORMAL HIGH (ref 70–99)
Glucose-Capillary: 158 mg/dL — ABNORMAL HIGH (ref 70–99)
Glucose-Capillary: 233 mg/dL — ABNORMAL HIGH (ref 70–99)

## 2020-05-10 LAB — BASIC METABOLIC PANEL
Anion gap: 10 (ref 5–15)
BUN: 33 mg/dL — ABNORMAL HIGH (ref 8–23)
CO2: 22 mmol/L (ref 22–32)
Calcium: 8.5 mg/dL — ABNORMAL LOW (ref 8.9–10.3)
Chloride: 106 mmol/L (ref 98–111)
Creatinine, Ser: 1.89 mg/dL — ABNORMAL HIGH (ref 0.61–1.24)
GFR calc Af Amer: 39 mL/min — ABNORMAL LOW (ref >60)
GFR calc non Af Amer: 34 mL/min — ABNORMAL LOW (ref >60)
Glucose, Bld: 157 mg/dL — ABNORMAL HIGH (ref 70–99)
Potassium: 4.6 mmol/L (ref 3.5–5.1)
Sodium: 138 mmol/L (ref 135–145)

## 2020-05-10 LAB — TYPE AND SCREEN
ABO/RH(D): O POS
Antibody Screen: NEGATIVE
Unit division: 0

## 2020-05-10 LAB — HEPARIN LEVEL (UNFRACTIONATED)
Heparin Unfractionated: <0.1 IU/mL — ABNORMAL LOW (ref 0.30–0.70)
Heparin Unfractionated: 0.57 IU/mL (ref 0.30–0.70)

## 2020-05-10 LAB — MAGNESIUM: Magnesium: 1.9 mg/dL (ref 1.7–2.4)

## 2020-05-10 MED ORDER — MORPHINE BOLUS VIA INFUSION: 1.0000 mg | INTRAVENOUS | Status: DC | PRN

## 2020-05-10 MED ORDER — FUROSEMIDE 10 MG/ML IJ SOLN
40.0000 mg | Freq: Once | INTRAMUSCULAR | Status: AC
  Administered 2020-05-10: 40 mg via INTRAVENOUS
  Filled 2020-05-10: qty 4

## 2020-05-10 MED ORDER — MORPHINE SULFATE (PF) 2 MG/ML IV SOLN: 1.0000 mg | INTRAVENOUS | Status: DC | PRN

## 2020-05-10 MED ORDER — PANTOPRAZOLE SODIUM 40 MG PO TBEC
40.0000 mg | DELAYED_RELEASE_TABLET | Freq: Every day | ORAL | Status: DC
  Administered 2020-05-10 – 2020-05-17 (×8): 40 mg via ORAL
  Filled 2020-05-10 (×9): qty 1

## 2020-05-10 MED ORDER — HEPARIN (PORCINE) 25000 UT/250ML-% IV SOLN
1450.0000 [IU]/h | INTRAVENOUS | Status: DC
  Administered 2020-05-10 – 2020-05-11 (×3): 1550 [IU]/h via INTRAVENOUS
  Filled 2020-05-10 (×3): qty 250

## 2020-05-10 NOTE — Progress Notes (Signed)
Nurse called due to a drop in patient's SPO2. He was placed on BIPAP, but patient continues to be anxious and RR was >30/ min. 1mg  of morphine x 1 was given with improved symptoms. ABG was ordered and patient was started on IV Morphine 1mg  q2hprn for RR >30/min.

## 2020-05-10 NOTE — Progress Notes (Signed)
ANTICOAGULATION CONSULT NOTE -  Pharmacy Consult for: heparin dosing  Indication: r/o PE  No Known Allergies  Patient Measurements: Height: 5\' 8"  (172.7 cm) Weight: 101 kg (222 lb 10.6 oz) IBW/kg (Calculated) : 68.4 Heparin Dosing Weight: 91 kg  Vital Signs: Temp: 97.9 F (36.6 C) (08/17 1702) Temp Source: Oral (08/17 1702) BP: 170/66 (08/17 1702) Pulse Rate: 76 (08/17 1702)  Labs: Recent Labs    05/08/20 0530 05/08/20 1926 05/09/20 0147 05/09/20 0147 05/09/20 0440 05/10/20 0445 05/10/20 1526  HGB 8.4*   < > 7.7*  7.7*   < > 7.5* 9.6*  --   HCT 27.1*   < > 24.6*  24.7*  --  23.8* 30.8*  --   PLT 254  --  215  --   --  270  --   HEPARINUNFRC  --   --  0.51  --   --  <0.10* 0.57  CREATININE 1.13  --  1.27*  --   --  1.89*  --    < > = values in this interval not displayed.    Estimated Creatinine Clearance: 38.9 mL/min (A) (by C-G formula based on SCr of 1.89 mg/dL (H)).   Assessment: Pharmacy  consulted to re-start heparin infusion for this 74 yo male for acute PE per CTA of chest. Patient was on heparin earlier this week for atrial fibrillation, but it was discontinued on 8-12 due to ecchymosis of patient's groin region.   HL 0.57   Goal of Therapy:  Heparin level 0.3-0.7 units/ml  Monitor platelets by anticoagulation protocol: Yes   Plan:  Continue  heparin infusion at 1550 units/hr  Check anti-Xa level in 8 hours and daily while on heparin Continue to monitor H&H and platelets  Donna Christen Emmely Bittinger 05/10/2020,5:27 PM

## 2020-05-10 NOTE — Progress Notes (Signed)
NAME:  Connor Bryant, MRN:  [id number], DOB:  1946/06/17, LOS: 9 ADMISSION DATE:  05/01/2020, CONSULTATION DATE:  8/11 REFERRING MD:  Shah/ Triad, CHIEF COMPLAINT:  resp distress    Brief History   74 yo male former smoker presented to ER with loss of vision in Lt eye and tingling in Lt arm.  Found to have acute infarct in Rt occipital lobe, but was outside window for tPA or thrombectomy.  Also found to have new onset atrial fibrillation.  Developed hypoxia with SpO2 of 52% on 05/03/20.  Required intubation and transferred to ICU.  Past Medical History  PAD s/p bilateral iliac stents, HTN, DM, CKD 3a, Iron deficiency anemia  Significant Hospital Events   8/08 admit 8/10 hypoxia, altered mental status, fever, transfer to ICU 8/11 cardiology consulted 8/13 extubated 8/16 PRBC transfusion; respiratory distress likely form hypervolemia  Consults:  Neurology Cardiology  Procedures:  ETT  8/10 >> 8/13   Significant Diagnostic Tests:   CT head 05/01/20 >> acute Rt occipital infarct  Echo 05/02/20 >> EF 60 o 65%, mild LVH, normal RV  Echo 05/04/20 >> EF 35% with Takotsubo pattern  CT angio chest 05/08/20 >> subsegmental PE RML, b/l Lt lower lobe GGO and ATX  Micro Data:  COVID 8/08 >> negative MRSA PCR 8/10 >> negative Blood 8/10 >> negative Sputum 8/11 >> oral flora Blood 8/12 >>   Antimicrobials:  Cefepime 8/10 >> 8/17  Interim history/subjective:  Felt anxious and short of breath overnight.  Needed Bipap and morphine overnight.  Still needing supplemental oxygen, but feels better.  Objective   Blood pressure (!) 170/66, pulse 76, temperature 97.9 F (36.6 C), temperature source Oral, resp. rate (!) 24, height 5\' 8"  (1.727 m), weight 101 kg, SpO2 98 %.        Intake/Output Summary (Last 24 hours) at 05/10/2020 1727 Last data filed at 05/10/2020 1311 Gross per 24 hour  Intake 687.36 ml  Output 200 ml  Net 487.36 ml   Filed Weights   05/08/20 0500 05/09/20 0500  05/10/20 0500  Weight: 99.8 kg 99.7 kg 101 kg    Examination:  General - alert Eyes - pupils reactive ENT - no sinus tenderness, no stridor Cardiac - regular rate/rhythm, 3/6 murmur Chest - equal breath sounds b/l, no wheezing or rales Abdomen - soft, non tender, + bowel sounds Extremities - no cyanosis, clubbing, or edema Skin - no rashes Neuro - normal strength, moves extremities, follows commands Psych - normal mood and behavior  Resolved Hospital Problem list      Assessment & Plan:   Acute hypoxic respiratory failure from acute pulmonary edema and acute pulmonary embolism. Hx of tobacco abuse with presumed COPD. - goal SpO2 > 92% - continue BDs - prn Bipap - assess for PFT as outpt through PCP - bronchial hygiene - assess for sleep study to determine presence of obstructive sleep apnea as outpt given comorbid conditions  Acute RML subsegmental PE. - anticoagulation per hospitalist  Acute Rt occipital CVA. - continue ASA  NSTEMI. Acute systolic CHF with Takotsubo pattern. New onset PAF, present on admission. Hx of HTN, HLD, PAD s/p bilateral iliac stents. - cardiology assessing for cardiac catheterization - continue coreg, lipitor, plavix, cozaar - negative fluid balance as tolerated  CKD 3a. Hypokalemia. - f/u BMET  Anemia of iron deficiency and critical illness. - f/u CBC - transfuse for Hb < 7 or significant bleeding  Fever. - completed ABx 8/17  DM type II poorly  controlled with hyperglycemia. - SSI  Best practice:  Diet: Soft diet DVT prophylaxis: heparin gtt GI prophylaxis: protonix Mobility: oob to chair Code Status: DNR Family Communication: updated pt's son at bedside Disposition: ICU    Labs    CMP Latest Ref Rng & Units 05/10/2020 05/09/2020 05/08/2020  Glucose 70 - 99 mg/dL 157(H) 138(H) 134(H)  BUN 8 - 23 mg/dL 33(H) 27(H) 29(H)  Creatinine 0.61 - 1.24 mg/dL 1.89(H) 1.27(H) 1.13  Sodium 135 - 145 mmol/L 138 138 140  Potassium  3.5 - 5.1 mmol/L 4.6 3.3(L) 3.7  Chloride 98 - 111 mmol/L 106 105 106  CO2 22 - 32 mmol/L 22 22 25   Calcium 8.9 - 10.3 mg/dL 8.5(L) 8.6(L) 8.9  Total Protein 6.5 - 8.1 g/dL - - -  Total Bilirubin 0.3 - 1.2 mg/dL - - -  Alkaline Phos 38 - 126 U/L - - -  AST 15 - 41 U/L - - -  ALT 0 - 44 U/L - - -    CBC Latest Ref Rng & Units 05/10/2020 05/09/2020 05/09/2020  WBC 4.0 - 10.5 K/uL 8.8 - -  Hemoglobin 13.0 - 17.0 g/dL 9.6(L) 7.5(L) 7.7(L)  Hematocrit 39 - 52 % 30.8(L) 23.8(L) 24.6(L)  Platelets 150 - 400 K/uL 270 - -    ABG    Component Value Date/Time   PHART 7.274 (L) 05/10/2020 0025   PCO2ART 52.2 (H) 05/10/2020 0025   PO2ART 229 (H) 05/10/2020 0025   HCO3 21.8 05/10/2020 0025   TCO2 26 03/11/2014 0432   ACIDBASEDEF 2.5 (H) 05/10/2020 0025   O2SAT 99.3 05/10/2020 0025    CBG (last 3)  Recent Labs    05/10/20 0722 05/10/20 1119 05/10/20 1700  GLUCAP 127* 156* 124*    Signature:  Chesley Mires, MD [place] Pager - [phone] 05/10/2020, 5:27 PM

## 2020-05-10 NOTE — Progress Notes (Signed)
ANTICOAGULATION CONSULT NOTE -  Pharmacy Consult for: heparin dosing  Indication: r/o PE  No Known Allergies  Patient Measurements: Height: 5\' 8"  (172.7 cm) Weight: 101 kg (222 lb 10.6 oz) IBW/kg (Calculated) : 68.4 Heparin Dosing Weight: 91 kg  Vital Signs: Temp: 98 F (36.7 C) (08/17 0736) Temp Source: Axillary (08/17 0736) BP: 138/51 (08/17 0700) Pulse Rate: 75 (08/17 0736)  Labs: Recent Labs    05/08/20 0530 05/08/20 1926 05/09/20 0147 05/09/20 0147 05/09/20 0440 05/10/20 0445  HGB 8.4*   < > 7.7*   7.7*   < > 7.5* 9.6*  HCT 27.1*   < > 24.6*   24.7*  --  23.8* 30.8*  PLT 254  --  215  --   --  270  HEPARINUNFRC  --   --  0.51  --   --  <0.10*  CREATININE 1.13  --  1.27*  --   --  1.89*   < > = values in this interval not displayed.    Estimated Creatinine Clearance: 38.9 mL/min (A) (by C-G formula based on SCr of 1.89 mg/dL (H)).   Assessment: Pharmacy  consulted to re-start heparin infusion for this 74 yo male for acute PE per CTA of chest. Patient was on heparin earlier this week for atrial fibrillation, but it was discontinued on 8-12 due to ecchymosis of patient's groin region.    Goal of Therapy:  Heparin level 0.3-0.7 units/ml aPTT 66-102 seconds Monitor platelets by anticoagulation protocol: Yes   Plan:  No heparin bolus d/t  hx of bleeding Re-start heparin infusion at 1550 units/hr  (rate at which pt's aPTT was therapeutic) Check anti-Xa level in 8 hours and daily while on heparin Continue to monitor H&H and platelets  Donna Christen Kynadee [place] 05/10/2020,7:44 AM

## 2020-05-10 NOTE — Progress Notes (Signed)
PT Cancellation Note  Patient Details Name: Connor Bryant MRN: [id number] DOB: 17-Sep-1946   Cancelled Treatment:    Reason Eval/Treat Not Completed: Fatigue/lethargy limiting ability to participate; Patient states he is feeling anxious after his "episode" last night and that he is tired. He is agreeable to try tomorrow. Will check back tomorrow if time permits.    11:40 AM, 05/10/20 Mearl Latin PT, DPT Physical Therapist at [facility]

## 2020-05-10 NOTE — Progress Notes (Signed)
Patient has been able to rest since earlier note of Dr Josephine Cables and has maintained vitals in the charted range He was taken off BiPAP and put on 5L O2 via [place] this AM at 0545 and is resting comfortably in no obvious or stated distress.  He is Awake and oriented x 4 at this time.

## 2020-05-10 NOTE — Progress Notes (Signed)
Bipap order is PRN at this time.  Patient is currently on 4L [place] with O2 sat of 98%.  No distress noted at this time but will continue to monitor.

## 2020-05-10 NOTE — Progress Notes (Signed)
PROGRESS NOTE    Connor Bryant  CWC:[id number] DOB: [birth year] DOA: 05/01/2020 PCP: Claretta Fraise, MD   Brief Narrative:  VOH:YWVPXT C Estesis a 74 y.o.malewith medical history significant forrecent iliac stents, diabetes mellitus, hypertension. Patient presented to the ED today with complaints of losing vision in his left eye,started yesterday.Patient woke up yesterday-Saturday morning with this, and this persisted today. Reports it feels like a dark shadow on the peripheral side ofhis left eye. He also reports some tingling in his left arm-this has resolved. No strokes. He denies weakness of his extremities. No change in speech, no facial asymmetry. Patient took his aspirin and Plavix and statins today. Patient had bilateral iliac stents,2 on the right, one on the left placed about 5 days ago at [facility].  The only cardiac history patient is aware of is a cardiac murmur. He is not aware of history of irregular heart rhythm.  ED Course:Blood pressure systolic 1 1 5-1 2 7, heart rate 49-59,hemoglobin 8.6,recent baseline about 11.Creatinine 1.7, about baseline. Head CT shows acute right occipital infarct. EKG showing atrial fibrillation. Telemetry neurology consulted-patient outside TPA window, stroke work-up.Dualantiplatelet for now. Hospitalist to admit for acute CVA.  8/11:Patient patient admitted with lateral vision loss to left eye with noted acute right-sided CVA. Overnight he has developed acute hypoxemic respiratory failure and required intubation on mechanical ventilator. He has been noted to have troponin elevation suggestive of NSTEMI and has been started on heparin drip. Cardiology following as well as pulmonology for vent management. There is suspicion he may have also aspirated with potential for HCAP.  8/12:Patient noted to have worsening LV dysfunction on 2D echocardiogram. Discussed with family yesterday and elected for  patient to be DNR. He was also noted to have atrial fibrillation with RVR overnight for which Cardizem was started. Discontinued by cardiology today and started on metoprolol. Planning to wean ventilator today if possible. Continues on cefepime. Continue Lasix for diuresis.  8/13:Patient noted to have some fevers overnight which have improved this a.m. Blood cultures with no growth noted thus far. Creatinine levels are improving and 2 L fluid output noted with Lasix. He was noted to have some A. fib with RVR overnight which resolved with IV metoprolol. Metoprolol dose increased by cardiology with redosing of IV Lasix today. Renal ultrasound with no acute findings and hematuria is noted to be clearing. Plan to wean off vent today.  8/14:Patient weaned off vent on 8/13. Noted to remain stable overnight with no further bleeding noted. Appears to be tolerating diet this morning. Appears euvolemic on 3 L nasal cannula oxygen with dark urine output noted.  8/15:Patient continues to do well this morning. Plan to obtain CT chest with contrast to rule out possibility of PE. Currently on 1.5 L.He appears to be eating well.  8/16: Patient currently on room air.  CT chest with contrast demonstrating right-sided PE as well as left lower lobe pneumonia.  Continue antibiotic treatment.  Heparin discontinued overnight due to worsening hemoglobin trend, but with no overt bleeding identified.  Receiving 1 unit PRBC transfusion as ordered to keep hemoglobin above 8.  Cardiology does not plan for catheterization at this time given recent CVA.  Diet has been restarted.  8/17: Patient noted to require BiPAP overnight for hypoxemia and shortness of breath, he has been weaned down to nasal cannula this morning and appears more comfortable.  He was given some morphine overnight for his dyspnea.  Chest x-ray demonstrates volume overload.  Plan is  for Lasix diuresis today and wean oxygen as tolerated.   Additionally, heparin drip has been restarted with stable hemoglobin levels noted and no overt bleeding.  Repeat hemoglobin levels need to be monitored in a.m. prior to transition to oral anticoagulation.  Assessment & Plan:   Principal Problem:   Acute CVA (cerebrovascular accident) [facility]) Active Problems:   Peripheral vascular disease ([place])   Gout   Essential hypertension   Type 2 diabetes mellitus without complication, without long-term current use of insulin (HCC)   Pure hypercholesterolemia   S/P insertion of iliac artery stent   PAF (paroxysmal atrial fibrillation) (HCC)   Elevated troponin   Fever   Positive D dimer   Acute respiratory failure with hypoxia (HCC)   Endotracheally intubated   Non-ST elevation (NSTEMI) myocardial infarction ([place])   Acute systolic heart failure ([place])   Acute hypoxemic respiratory failure status post intubation 8/10-status post extubation -Likely multifactorial with HCAP as well as PE now noted on CT exam 8/15 -DuoNeb for potential component of air trapping with probable COPD -Cefepime discontinued after 8 days of treatment with noted left lower lobe pneumonia -Appreciate ongoing pulmonology recommendations  -Patient currently on 4 L nasal cannula oxygen required BiPAP overnight -Chest x-ray 8/17 with cardiomegaly and pulmonary vascular congestion -Plan to give Lasix 40 mg IV x1 dose for now and monitor renal function carefully  PE -Noted on CT chest 8/15 -Plan to resume on heparin today and monitor hemoglobin levels, if he remained stable with no overt bleeding may consider switch to DOAC and subsequent discharge  Left lower lobe pneumonia -Cefepime discontinued after 8 total days of treatment -Continue to monitor  NSTEMI -Heparindiscontinued due to ecchymosis of her groin region -Continue on Plavix and aspirin with no further Eliquis at this time -Continue atorvastatin -Beta-blocker to resume once blood pressure stabilizes -Limited  2D echocardiogramwith LVEF 35% and apical akinesis. Not a candidate for emergent catheterization at this time. May reflect stress-induced cardiomyopathy versus MI.Cardiology to reconsider catheterization by 8/16 -IV Lasixto be held for now -No need for immediate catheterization, continue to follow per cardiologywith metoprolol initiated today -Troponin downward trending with peak of 2500 -Discussed with cardiology on 8/16 with plans for cardiac catheterization in the outpatient setting  Acute right occipital CVA with vision loss to left eye -LDL 54 -A1c 6.2% -He was started on Eliquis with recommendation to continue Plavix by his vascular surgeon Dr. Rosana Hoes at [place] will need 30-day Holter monitoring in outpatient setting -Appreciate neurology recommendations  AKI on CKD stage III -Likely cardiorenal with some volume overload noted from recent PRBC transfusion -Continue to monitor with Lasix x1 dose to be given today -Baseline creatinine 1.2-1.3 -Creatinine stable at1.89 -Monitor repeat labs in a.m. -Hold ARB  Probable paroxysmal atrial fibrillation -Likely to require outpatient 30-day monitor for further evaluation given acute CVA -Heparin drip held for now due to ecchymosis of her groin region -Elevated heart rates noted overnight on 8/12. Started on Lopressor 25 mg 3 times daily by cardiology, this has been increased to 37.5 mg 3 times daily today given elevated heart rates overnight. -IV metoprolol on board for heart rate elevationsas needed  Anemia-chronic iron deficiency anemia noted-downtrending -Improved after 1 unit PRBC transfusion earlier, repeat PRBC transfusion on 8/16 -Plan to follow CBC -No overt bleeding currently noted -Restart heparin today and monitor repeat H&H  Hematuria-resolved -Heparin drip discontinued 8/13 -Could be related to traumatic Foley insertion -No significant findings on renal ultrasound  Diabetes,  non-insulin-dependent with  nephropathy -Holding home oral Metformin and pioglitazone -Continue SSI with stable blood glucose readings noted  History of COPD-Long history of tobacco abuse -We will need outpatient pulmonology follow-up for PFTs -Continue on duo nebs every 6 hours  PVD -Status post mesenteric artery bypass with recent bilateral iliac stenting at [facility] 04/27/2020 -Plan to continue on Eliquis and Plavix outpatient and hold aspirin, per discussion with vascular surgeon at [facility] and once stable -Currently with bruising over the right groin region with pressure dressing applied. Anticoagulation held. -Pressure dressing removed 8/14 and bruising appears to be improving.  Hypertension-stable -Patient on metoprolol per cardiology -Further diuresisheld for now  DVT prophylaxis:Heparin drip Code Status:Full code Family Communication:Discussed with son 8/16; tried calling on 8/17 with no response Disposition Plan: Status is: Inpatient  Remains inpatient appropriate because:Ongoing diagnostic testing needed not appropriate for outpatient work up, IV treatments appropriate due to intensity of illness or inability to take PO and Inpatient level of care appropriate due to severity of illness   Dispo: The patient is from:Home Anticipated d/c is DG:LOVF with home health physical therapy Anticipated d/c date is:1-2 days Patient currently is not medically stable to d/c.   He needs to have some diuresis today with hypoxemia noted overnight.  Additionally, heparin drip has been started and he will need close H&H monitoring to ensure stability of hemoglobin levels prior to discharge on oral anticoagulation.  Consultants:  Cardiology  Neurology  Pulmonology  Procedures:  See below  Antimicrobials:  Anti-infectives (From admission, onward)   Start     Dose/Rate Route Frequency Ordered Stop   05/04/20 2200   vancomycin (VANCOREADY) IVPB 1250 mg/250 mL  Status:  Discontinued        1,250 mg 166.7 mL/hr over 90 Minutes Intravenous Every 24 hours 05/03/20 2335 05/04/20 0118   05/04/20 1400  ceFEPIme (MAXIPIME) 2 g in sodium chloride 0.9 % 100 mL IVPB  Status:  Discontinued        2 g 200 mL/hr over 30 Minutes Intravenous Every 12 hours 05/04/20 1348 05/10/20 1017   05/04/20 1000  ceFEPIme (MAXIPIME) 2 g in sodium chloride 0.9 % 100 mL IVPB  Status:  Discontinued        2 g 200 mL/hr over 30 Minutes Intravenous Every 12 hours 05/03/20 2335 05/04/20 0942   05/03/20 2315  vancomycin (VANCOREADY) IVPB 2000 mg/400 mL        2,000 mg 200 mL/hr over 120 Minutes Intravenous  Once 05/03/20 2303 05/04/20 0153   05/03/20 2315  ceFEPIme (MAXIPIME) 2 g in sodium chloride 0.9 % 100 mL IVPB  Status:  Discontinued        2 g 200 mL/hr over 30 Minutes Intravenous  Once 05/03/20 2303 05/04/20 0118       Subjective: Patient seen and evaluated today with noted hypoxemia overnight.  He did require BiPAP transiently and is now on nasal cannula oxygen.  He denies any current dyspnea or chest pain.  Objective: Vitals:   05/10/20 1200 05/10/20 1300 05/10/20 1400 05/10/20 1500  BP: (!) 134/54 (!) 143/58 (!) 133/44 (!) 176/75  Pulse: 74 79 79 80  Resp: (!) 22 17 (!) 21 19  Temp:      TempSrc:      SpO2: 95% 96% 96% 96%  Weight:      Height:        Intake/Output Summary (Last 24 hours) at 05/10/2020 1649 Last data filed at 05/10/2020 1311 Gross per 24 hour  Intake 687.36  ml  Output 200 ml  Net 487.36 ml   Filed Weights   05/08/20 0500 05/09/20 0500 05/10/20 0500  Weight: 99.8 kg 99.7 kg 101 kg    Examination:  General exam: Appears calm and comfortable  Respiratory system: Clear to auscultation. Respiratory effort normal.  Currently on 4 L nasal cannula oxygen Cardiovascular system: S1 & S2 heard, RRR. Gastrointestinal system: Abdomen is soft Central nervous system: Alert and oriented. No focal  neurological deficits. Extremities: Symmetric 5 x 5 power. Skin: No rashes, lesions or ulcers Psychiatry: Judgement and insight appear normal. Mood & affect appropriate.     Data Reviewed: I have personally reviewed following labs and imaging studies  CBC: Recent Labs  Lab 05/06/20 0400 05/06/20 0400 05/07/20 0653 05/07/20 0653 05/08/20 0530 05/08/20 1926 05/09/20 0147 05/09/20 0440 05/10/20 0445  WBC 5.8  --  6.5  --  6.1  --  6.0  --  8.8  HGB 9.9*   < > 10.1*   < > 8.4* 8.6* 7.7*   7.7* 7.5* 9.6*  HCT 32.5*   < > 33.1*   < > 27.1* 28.1* 24.6*   24.7* 23.8* 30.8*  MCV 98.2  --  96.8  --  96.1  --  96.1  --  93.6  PLT 213  --  255  --  254  --  215  --  270   < > = values in this interval not displayed.   Basic Metabolic Panel: Recent Labs  Lab 05/03/20 2133 05/04/20 0407 05/06/20 0400 05/07/20 0653 05/08/20 0530 05/09/20 0147 05/10/20 0445  NA 139   < > 143 142 140 138 138  K 4.0   < > 3.8 3.2* 3.7 3.3* 4.6  CL 107   < > 107 103 106 105 106  CO2 19*   < > 21* 25 25 22 22   GLUCOSE 283*   < > 129* 119* 134* 138* 157*  BUN 25*   < > 24* 29* 29* 27* 33*  CREATININE 1.59*   < > 1.30* 1.29* 1.13 1.27* 1.89*  CALCIUM 8.4*   < > 8.7* 9.2 8.9 8.6* 8.5*  MG 1.8   < > 1.8 1.8 1.7 1.8 1.9  PHOS 6.4*  --   --   --   --   --   --    < > = values in this interval not displayed.   GFR: Estimated Creatinine Clearance: 38.9 mL/min (A) (by C-G formula based on SCr of 1.89 mg/dL (H)). Liver Function Tests: Recent Labs  Lab 05/03/20 2133 05/05/20 0319  AST 24 41  ALT 18 19  ALKPHOS 70 55  BILITOT 1.3* 1.4*  PROT 7.0 7.0  ALBUMIN 3.5 3.2*   No results for input(s): LIPASE, AMYLASE in the last 168 hours. No results for input(s): AMMONIA in the last 168 hours. Coagulation Profile: Recent Labs  Lab 05/03/20 2133  INR 1.0   Cardiac Enzymes: No results for input(s): CKTOTAL, CKMB, CKMBINDEX, TROPONINI in the last 168 hours. BNP (last 3 results) No results for  input(s): PROBNP in the last 8760 hours. HbA1C: No results for input(s): HGBA1C in the last 72 hours. CBG: Recent Labs  Lab 05/09/20 1953 05/10/20 0006 05/10/20 0445 05/10/20 0722 05/10/20 1119  GLUCAP 171* 233* 158* 127* 156*   Lipid Profile: Recent Labs    05/09/20 2101  TRIG 90   Thyroid Function Tests: No results for input(s): TSH, T4TOTAL, FREET4, T3FREE, THYROIDAB in the last 72 hours. Anemia Panel:  No results for input(s): VITAMINB12, FOLATE, FERRITIN, TIBC, IRON, RETICCTPCT in the last 72 hours. Sepsis Labs: Recent Labs  Lab 05/03/20 2132 05/03/20 2133 05/03/20 2321 05/05/20 0839 05/06/20 0400 05/07/20 0653  PROCALCITON  --  <0.10  --  0.73 0.54 0.35  LATICACIDVEN 2.9*  --  1.5  --   --   --     Recent Results (from the past 240 hour(s))  SARS Coronavirus 2 by RT PCR (hospital order, performed in [place] hospital lab) Nasopharyngeal Nasopharyngeal Swab     Status: None   Collection Time: 05/01/20  7:43 PM   Specimen: Nasopharyngeal Swab  Result Value Ref Range Status   SARS Coronavirus 2 NEGATIVE NEGATIVE Final    Comment: (NOTE) SARS-CoV-2 target nucleic acids are NOT DETECTED.  The SARS-CoV-2 RNA is generally detectable in upper and lower respiratory specimens during the acute phase of infection. The lowest concentration of SARS-CoV-2 viral copies this assay can detect is 250 copies / mL. A negative result does not preclude SARS-CoV-2 infection and should not be used as the sole basis for treatment or other patient management decisions.  A negative result may occur with improper specimen collection / handling, submission of specimen other than nasopharyngeal swab, presence of viral mutation(s) within the areas targeted by this assay, and inadequate number of viral copies (<250 copies / mL). A negative result must be combined with clinical observations, patient history, and epidemiological information.  Fact Sheet for Patients:     StrictlyIdeas.no  Fact Sheet for Healthcare Providers: BankingDealers.co.za  This test is not yet approved or  cleared by the [place] FDA and has been authorized for detection and/or diagnosis of SARS-CoV-2 by FDA under an Emergency Use Authorization (EUA).  This EUA will remain in effect (meaning this test can be used) for the duration of the COVID-19 declaration under Section 564(b)(1) of the Act, 21 U.S.C. section 360bbb-3(b)(1), unless the authorization is terminated or revoked sooner.  Performed at [facility], [address], [place], [place] [address]   MRSA PCR Screening     Status: None   Collection Time: 05/03/20  9:46 PM   Specimen: Nasal Mucosa; Nasopharyngeal  Result Value Ref Range Status   MRSA by PCR NEGATIVE NEGATIVE Final    Comment:        The GeneXpert MRSA Assay (FDA approved for NASAL specimens only), is one component of a comprehensive MRSA colonization surveillance program. It is not intended to diagnose MRSA infection nor to guide or monitor treatment for MRSA infections. Performed at [facility], [address], [place], [place] [address]   Culture, blood (x 2)     Status: None   Collection Time: 05/03/20 11:21 PM   Specimen: BLOOD  Result Value Ref Range Status   Specimen Description BLOOD  Final   Special Requests NONE  Final   Culture   Final    NO GROWTH 5 DAYS Performed at [facility], [address] [place],  [address]    Report Status 05/08/2020 FINAL  Final  Culture, blood (x 2)     Status: None   Collection Time: 05/03/20 11:26 PM   Specimen: BLOOD RIGHT HAND  Result Value Ref Range Status   Specimen Description BLOOD RIGHT HAND  Final   Special Requests   Final    BOTTLES DRAWN AEROBIC AND ANAEROBIC Blood Culture adequate volume   Culture   Final    NO GROWTH 5 DAYS Performed at [facility], [address]  [place], [place] [address]    Report Status 05/08/2020 FINAL   Final  Culture, respiratory (non-expectorated)     Status: None   Collection Time: 05/04/20  1:46 PM   Specimen: Tracheal Aspirate; Respiratory  Result Value Ref Range Status   Specimen Description   Final    TRACHEAL ASPIRATE Performed at [facility], [address], [place], [place] [address]    Special Requests   Final    NONE Performed at [facility], [address], [place], [place] [address]    Gram Stain   Final    FEW WBC PRESENT, PREDOMINANTLY PMN RARE GRAM POSITIVE COCCI RARE GRAM NEGATIVE RODS    Culture   Final    Consistent with normal respiratory flora. Performed at [facility], [place] [address] [place], [place] [address]    Report Status 05/07/2020 FINAL  Final  Culture, blood (routine x 2)     Status: None (Preliminary result)   Collection Time: 05/05/20  6:03 PM   Specimen: Left Antecubital; Blood  Result Value Ref Range Status   Specimen Description LEFT ANTECUBITAL  Final   Special Requests   Final    BOTTLES DRAWN AEROBIC ONLY Blood Culture adequate volume   Culture   Final    NO GROWTH 4 DAYS Performed at [facility], [address] [place], [place] [address]    Report Status PENDING  Incomplete  Culture, blood (routine x 2)     Status: None (Preliminary result)   Collection Time: 05/05/20  8:10 PM   Specimen: BLOOD LEFT ARM  Result Value Ref Range Status   Specimen Description BLOOD LEFT ARM  Final   Special Requests   Final    BOTTLES DRAWN AEROBIC AND ANAEROBIC Blood Culture adequate volume   Culture   Final    NO GROWTH 4 DAYS Performed at [facility], [address] [place],  [address]    Report Status PENDING  Incomplete         Radiology Studies: DG Chest 1 View  Result Date: 05/10/2020 CLINICAL DATA:  Chest pain and weakness. EXAM: CHEST  1 VIEW COMPARISON:  Single-view of the chest 05/05/2020. PA and lateral chest 03/15/2014. CT chest 05/08/2020. FINDINGS: There is cardiomegaly and vascular congestion. No  consolidative process, pneumothorax or effusion. Aortic atherosclerosis noted. No acute or focal bony abnormality. IMPRESSION: Cardiomegaly and vascular congestion. Aortic Atherosclerosis (ICD10-I70.0). Electronically Signed   By: Inge Rise M.D.   On: 05/10/2020 13:58   CT ANGIO CHEST PE W OR WO CONTRAST  Addendum Date: 05/08/2020   ADDENDUM REPORT: 05/08/2020 17:52 ADDENDUM: These results were called by telephone at the time of interpretation on 05/08/2020 at 5:52 pm to provider [facility] , who verbally acknowledged these results. Electronically Signed   By: Zerita Boers M.D.   On: 05/08/2020 17:52   Result Date: 05/08/2020 CLINICAL DATA:  Concern for pulmonary embolism. EXAM: CT ANGIOGRAPHY CHEST WITH CONTRAST TECHNIQUE: Multidetector CT imaging of the chest was performed using the standard protocol during bolus administration of intravenous contrast. Multiplanar CT image reconstructions and MIPs were obtained to evaluate the vascular anatomy. CONTRAST:  151m OMNIPAQUE IOHEXOL 350 MG/ML SOLN COMPARISON:  Chest radiograph dated 05/05/2020. FINDINGS: Cardiovascular: Satisfactory opacification of the pulmonary arteries to the segmental level. A filling defect in a subsegmental pulmonary arteries supplying the right middle lobe is consistent with an acute pulmonary embolism (series 5, images 150 8-162). Significant atherosclerotic calcifications are seen in the coronary arteries and aortic arch.  The heart is enlarged. No pericardial effusion. Mediastinum/Nodes: There is mild bilateral hilar lymphadenopathy. No enlarged mediastinal or axillary lymph nodes. Thyroid gland, trachea, and esophagus demonstrate no significant findings. Lungs/Pleura: There are ground-glass opacities and consolidation in the left lower lobe near the lung base. There is mild bilateral dependent atelectasis. There is no pleural effusion or pneumothorax. Upper Abdomen: No acute abnormality. Musculoskeletal: There is a chronic  appearing anterior wedge deformity of T12. Degenerative changes are seen in the spine. Review of the MIP images confirms the above findings. IMPRESSION: 1. Acute pulmonary embolism in a subsegmental pulmonary artery supplying the right middle lobe. 2. Ground-glass opacities and consolidation in the left lower lobe near the lung base likely represent pneumonia. 3. Mild bilateral hilar lymphadenopathy is likely reactive. Aortic Atherosclerosis (ICD10-I70.0). Electronically Signed: By: Zerita Boers M.D. On: 05/08/2020 17:47        Scheduled Meds:  sodium chloride   Intravenous Once   allopurinol  100 mg Oral BID   aspirin EC  81 mg Oral Daily   atorvastatin  80 mg Oral Daily   carvedilol  6.25 mg Oral BID WC   chlorhexidine gluconate (MEDLINE KIT)  15 mL Mouth Rinse BID   Chlorhexidine Gluconate Cloth  6 each Topical Daily   clopidogrel  75 mg Oral Daily   furosemide  40 mg Intravenous Once   insulin aspart  0-5 Units Subcutaneous QHS   insulin aspart  0-9 Units Subcutaneous TID WC   pantoprazole  40 mg Oral Daily   polyethylene glycol  8.5 g Oral Daily   sodium chloride flush  10-40 mL Intracatheter Q12H   Continuous Infusions:  heparin 1,550 Units/hr (05/10/20 1311)     LOS: 9 days    Time spent: 35 minutes    Liora Myles Darleen Crocker, DO [facility]  If 7PM-7AM, please contact night-coverage [email or web address] 05/10/2020, 4:49 PM

## 2020-05-10 NOTE — ED Provider Notes (Addendum)
°  Patient seen by me called from the emergency department on the evening of August 10.  Initial note was done on the patient's neighbor.  But this is the patient that was intubated.    Called to telemetry for respiratory distress.  Patient suddenly dropped his sats down to 58%.  We were able to bag him with the Ambu bag back up to 97%.  Patient's cardiac monitor was a sinus tachycardia and his EKG showed the same.  Hospitalist was there.  I was called to the scene for the respiratory distress and need for intubation.  Patient was given 20 mg of etomidate 100 mg of succinylcholine.  Patient's potassium was verified to be normal.  Patient was related to medications.  And was available.  Training purpose assisted the respiratory tech during the intubation.  Glide scope was used.  7-1/2 tube.  No significant difficulties with the intubation.  Patient with good equal breath sounds bilaterally.  CO detector had good change was fogging of the endotracheal tube.  Patient will be moved to ICU from telemetry.  Chest x-ray will be performed there.  Respiratory therapist will check to positioning.  Cording to the hospitalist to there is also thinking the probably get a CTA head.  Date/Time: 05/03/2020 8:57 PM Performed by: Fredia Sorrow, MD Pre-anesthesia Checklist: Patient identified, Suction available, Patient being monitored, Timeout performed and Emergency Drugs available Oxygen Delivery Method: Ambu bag Preoxygenation: Pre-oxygenation with 100% oxygen Induction Type: Rapid sequence and Cricoid Pressure applied Ventilation: Mask ventilation with difficulty Laryngoscope Size: Glidescope and 3 Tube size: 7.5 mm Number of attempts: 1 Airway Equipment and Method: Stylet Placement Confirmation: ETT inserted through vocal cords under direct vision,  CO2 detector and Breath sounds checked- equal and bilateral Secured at: 24 cm Tube secured with: ETT holder        Fredia Sorrow,  MD 05/03/20 2059   Fredia Sorrow, MD 05/10/20 [place], MD 05/10/20 1007

## 2020-05-10 NOTE — Progress Notes (Signed)
Progress Note  Patient Name: Connor Bryant Date of Encounter: 05/10/2020  Primary Cardiologist: Carlyle Dolly, MD  Subjective   No chest pain.  Did have hypoxia with shortness of breath overnight, improved with morphine and BiPAP overnight.  Inpatient Medications    Scheduled Meds: . sodium chloride   Intravenous Once  . allopurinol  100 mg Oral BID  . aspirin EC  81 mg Oral Daily  . atorvastatin  80 mg Oral Daily  . carvedilol  6.25 mg Oral BID WC  . chlorhexidine gluconate (MEDLINE KIT)  15 mL Mouth Rinse BID  . Chlorhexidine Gluconate Cloth  6 each Topical Daily  . clopidogrel  75 mg Oral Daily  . insulin aspart  0-5 Units Subcutaneous QHS  . insulin aspart  0-9 Units Subcutaneous TID WC  . pantoprazole  40 mg Oral Daily  . polyethylene glycol  8.5 g Oral Daily  . sodium chloride flush  10-40 mL Intracatheter Q12H   Continuous Infusions: . ceFEPime (MAXIPIME) IV 2 g (05/10/20 0008)  . heparin 1,550 Units/hr (05/10/20 0808)   PRN Meds: acetaminophen **OR** acetaminophen, fentaNYL (SUBLIMAZE) injection, hydrALAZINE, ipratropium-albuterol, metoprolol tartrate, morphine injection, sodium chloride flush   Vital Signs    Vitals:   05/10/20 0600 05/10/20 0700 05/10/20 0736 05/10/20 0800  BP: (!) 124/54 (!) 138/51  (!) 150/59  Pulse: 73 72 75 75  Resp: _0 Temp:   98 F (36.7 C)   TempSrc:   Axillary   SpO2: 94% 97% 95% 95%  Weight:      Height:        Intake/Output Summary (Last 24 hours) at 05/10/2020 0859 Last data filed at 05/10/2020 0500 Gross per 24 hour  Intake 100 ml  Output 200 ml  Net -100 ml   Filed Weights   05/08/20 0500 05/09/20 0500 05/10/20 0500  Weight: 99.8 kg 99.7 kg 101 kg    Telemetry    Normal sinus rhythm..  Personally reviewed.  ECG    An ECG dated 05/06/2020 was personally reviewed today and demonstrated:  Sinus rhythm with diffuse ST T wave abnormalities.  Physical Exam   GEN:  Elderly male.  No acute distress.    Neck:  No JVD. Cardiac:  RRR with 3/6 basal systolic murmur, no gallop.  Respiratory:  Nonlabored, clear anteriorly. GI: Soft, nontender, bowel sounds present. MS: No edema; No deformity. Neuro:  Nonfocal. Psych: Alert and oriented x 3. Normal affect.  Labs    Chemistry Recent Labs  Lab 05/03/20 2133 05/04/20 0407 05/05/20 0319 05/06/20 0400 05/08/20 0530 05/09/20 0147 05/10/20 0445  NA 139   < > 141   < > 140 138 138  K 4.0   < > 4.2   < > 3.7 3.3* 4.6  CL 107   < > 111   < > 106 105 106  CO2 19*   < > 20*   < > _1 GLUCOSE 283*   < > 143*   < > 134* 138* 157*  BUN 25*   < > 25*   < > 29* 27* 33*  CREATININE 1.59*   < > 1.51*   < > 1.13 1.27* 1.89*  CALCIUM 8.4*   < > 8.4*   < > 8.9 8.6* 8.5*  PROT 7.0  --  7.0  --   --   --   --   ALBUMIN 3.5  --  3.2*  --   --   --   --  AST 24  --  41  --   --   --   --   ALT 18  --  19  --   --   --   --   ALKPHOS 70  --  55  --   --   --   --   BILITOT 1.3*  --  1.4*  --   --   --   --   GFRNONAA 42*   < >  --    < > >60 55* 34*  GFRAA 48*   < >  --    < > >60 >60 39*  ANIONGAP 13   < > 10.0   < > _0 < > = values in this interval not displayed.     Hematology Recent Labs  Lab 05/08/20 0530 05/08/20 1926 05/09/20 0147 05/09/20 0440 05/10/20 0445  WBC 6.1  --  6.0  --  8.8  RBC 2.82*  --  2.57*  --  3.29*  HGB 8.4*   < > 7.7*  7.7* 7.5* 9.6*  HCT 27.1*   < > 24.6*  24.7* 23.8* 30.8*  MCV 96.1  --  96.1  --  93.6  MCH 29.8  --  30.0  --  29.2  MCHC 31.0  --  31.2  --  31.2  RDW 15.6*  --  15.2  --  16.2*  PLT 254  --  215  --  270   < > = values in this interval not displayed.    Cardiac Enzymes Recent Labs  Lab 05/04/20 0442 05/04/20 1622 05/04/20 1948 05/05/20 0319 05/05/20 0752  TROPONINIHS 2,324* 2,506* 2,499* 2,354* 540*    BNP Recent Labs  Lab 05/03/20 2133 05/05/20 0319 05/07/20 0653  BNP 1,082.0* 764.0* 487.0*     DDimer Recent Labs  Lab 05/03/20 2133  DDIMER 3.83*      Radiology    CT ANGIO CHEST PE W OR WO CONTRAST  Addendum Date: 05/08/2020   ADDENDUM REPORT: 05/08/2020 17:52 ADDENDUM: These results were called by telephone at the time of interpretation on 05/08/2020 at 5:52 pm to provider [facility] , who verbally acknowledged these results. Electronically Signed   By: Zerita Boers M.D.   On: 05/08/2020 17:52   Result Date: 05/08/2020 CLINICAL DATA:  Concern for pulmonary embolism. EXAM: CT ANGIOGRAPHY CHEST WITH CONTRAST TECHNIQUE: Multidetector CT imaging of the chest was performed using the standard protocol during bolus administration of intravenous contrast. Multiplanar CT image reconstructions and MIPs were obtained to evaluate the vascular anatomy. CONTRAST:  15m OMNIPAQUE IOHEXOL 350 MG/ML SOLN COMPARISON:  Chest radiograph dated 05/05/2020. FINDINGS: Cardiovascular: Satisfactory opacification of the pulmonary arteries to the segmental level. A filling defect in a subsegmental pulmonary arteries supplying the right middle lobe is consistent with an acute pulmonary embolism (series 5, images 150 8-162). Significant atherosclerotic calcifications are seen in the coronary arteries and aortic arch. The heart is enlarged. No pericardial effusion. Mediastinum/Nodes: There is mild bilateral hilar lymphadenopathy. No enlarged mediastinal or axillary lymph nodes. Thyroid gland, trachea, and esophagus demonstrate no significant findings. Lungs/Pleura: There are ground-glass opacities and consolidation in the left lower lobe near the lung base. There is mild bilateral dependent atelectasis. There is no pleural effusion or pneumothorax. Upper Abdomen: No acute abnormality. Musculoskeletal: There is a chronic appearing anterior wedge deformity of T12. Degenerative changes are seen in the spine. Review of the MIP images confirms the above findings. IMPRESSION: 1. Acute  pulmonary embolism in a subsegmental pulmonary artery supplying the right middle lobe. 2. Ground-glass  opacities and consolidation in the left lower lobe near the lung base likely represent pneumonia. 3. Mild bilateral hilar lymphadenopathy is likely reactive. Aortic Atherosclerosis (ICD10-I70.0). Electronically Signed: By: Zerita Boers M.D. On: 05/08/2020 17:47    Cardiac Studies   Echocardiogram 05/02/2020: 1. Left ventricular ejection fraction, by estimation, is 60 to 65%. The  left ventricle has normal function. Left ventricular endocardial border  not optimally defined to evaluate regional wall motion. The left  ventricular internal cavity size was mildly  dilated. There is mild left ventricular hypertrophy. Left ventricular  diastolic parameters are indeterminate.  2. Right ventricular systolic function is normal. The right ventricular  size is normal.  3. The mitral valve is abnormal. Trivial mitral valve regurgitation.  4. AV is thickened, calcified with mildly restricted motion. Peak and  mean gradients through the valve are 31 and 18 mm Hg respectively. . The  aortic valve is abnormal. Aortic valve regurgitation is not visualized.   Limited echocardiogram 05/04/2020: 1. The entire apex from mid to distal ventricle is akinetic. Pattern can  be consistent with stress induced cardiomyopathy/Takotsubo CM, however  cannot exclude ischemic etiology   . Left ventricular ejection fraction, by estimation, is 35%. The left  ventricle has moderately decreased function.  2. The inferior vena cava is normal in size with greater than 50%  respiratory variability, suggesting right atrial pressure of 3 mmHg.   Patient Profile     74 y.o. male with past medical history of PAD (s/p prior mesenteric artery bypass and recent bilateral iliac stenting at [facility] on 04/27/2020),HTN, HLD,Type 2 DMand Stage 3 CKD.  Assessment & Plan    1.  NSTEMI, peak high-sensitivity troponin I of 2506.  Occurred in the setting of presentation initially with acute stroke, then acute pulmonary edema  with hypoxic respiratory failure requiring intubation.  Initial echocardiogram on August 9 showed normal LVEF at 60 to 65%, follow-up study after clinical deterioration on August 11 showed LVEF 35% with possibility of stress-induced cardiomyopathy versus ischemic etiology.  He does not report any chest pain or managing him medically at this point deferring cardiac catheterization in light of multiple active comorbidities.  2.  Paroxysmal atrial fibrillation documented during hospital stay, new diagnosis.  Currently in sinus rhythm.  CHA2DS2-VASc score is 8.  3.  Acute pulmonary embolus, subsegmental pulmonary artery supplying the right middle lobe, documented August 15 by chest CTA.  Currently on IV heparin.  4.  Acute right occipital stroke at presentation with vision loss left eye.  5.  PAD status post mesenteric artery bypass in the past and more recently bilateral iliac stent intervention at [facility].  6.  Intermittent fevers, presently resolved.  Treated with antibiotics empirically by primary team.  7.  CKD stage 3b.  Acute renal insufficiency noted with creatinine up to 1.89.  8.  Acute anemia, most recently hemoglobin down to 7.5.  Improved with PRBCs, hemoglobin up to 9.6.  Reviewed interval hospital course.  Patient is hemodynamically stable and reports no active chest pain.  We will continue to manage medically at this point pending improvement comorbidities.  He is back on IV heparin for treatment of acute pulmonary embolus.  Also remains in sinus rhythm.  Continue aspirin, Plavix, Coreg, and Lipitor. Would not initiate ARB or Aldactone at this point in light of acute on chronic renal insufficiency.  Signed, Rozann Lesches, MD  05/10/2020, 8:59 AM

## 2020-05-11 DIAGNOSIS — J9601 Acute respiratory failure with hypoxia: Secondary | ICD-10-CM | POA: Diagnosis not present

## 2020-05-11 DIAGNOSIS — I214 Non-ST elevation (NSTEMI) myocardial infarction: Secondary | ICD-10-CM | POA: Diagnosis not present

## 2020-05-11 DIAGNOSIS — I5021 Acute systolic (congestive) heart failure: Secondary | ICD-10-CM

## 2020-05-11 DIAGNOSIS — I639 Cerebral infarction, unspecified: Secondary | ICD-10-CM | POA: Diagnosis not present

## 2020-05-11 LAB — CBC
HCT: 27.5 % — ABNORMAL LOW (ref 39.0–52.0)
Hemoglobin: 8.5 g/dL — ABNORMAL LOW (ref 13.0–17.0)
MCH: 28.8 pg (ref 26.0–34.0)
MCHC: 30.9 g/dL (ref 30.0–36.0)
MCV: 93.2 fL (ref 80.0–100.0)
Platelets: 231 10*3/uL (ref 150–400)
RBC: 2.95 MIL/uL — ABNORMAL LOW (ref 4.22–5.81)
RDW: 15.9 % — ABNORMAL HIGH (ref 11.5–15.5)
WBC: 6.3 10*3/uL (ref 4.0–10.5)
nRBC: 0 % (ref 0.0–0.2)

## 2020-05-11 LAB — GLUCOSE, CAPILLARY
Glucose-Capillary: 124 mg/dL — ABNORMAL HIGH (ref 70–99)
Glucose-Capillary: 132 mg/dL — ABNORMAL HIGH (ref 70–99)
Glucose-Capillary: 136 mg/dL — ABNORMAL HIGH (ref 70–99)
Glucose-Capillary: 123 mg/dL — ABNORMAL HIGH (ref 70–99)
Glucose-Capillary: 135 mg/dL — ABNORMAL HIGH (ref 70–99)

## 2020-05-11 LAB — CULTURE, BLOOD (ROUTINE X 2)
Culture: NO GROWTH
Culture: NO GROWTH
Special Requests: ADEQUATE
Special Requests: ADEQUATE

## 2020-05-11 LAB — BASIC METABOLIC PANEL
Anion gap: 10 (ref 5–15)
BUN: 38 mg/dL — ABNORMAL HIGH (ref 8–23)
CO2: 23 mmol/L (ref 22–32)
Calcium: 8.3 mg/dL — ABNORMAL LOW (ref 8.9–10.3)
Chloride: 105 mmol/L (ref 98–111)
Creatinine, Ser: 2.27 mg/dL — ABNORMAL HIGH (ref 0.61–1.24)
GFR calc Af Amer: 32 mL/min — ABNORMAL LOW (ref >60)
GFR calc non Af Amer: 27 mL/min — ABNORMAL LOW (ref >60)
Glucose, Bld: 136 mg/dL — ABNORMAL HIGH (ref 70–99)
Potassium: 3 mmol/L — ABNORMAL LOW (ref 3.5–5.1)
Sodium: 138 mmol/L (ref 135–145)

## 2020-05-11 LAB — MAGNESIUM: Magnesium: 1.8 mg/dL (ref 1.7–2.4)

## 2020-05-11 LAB — HEPARIN LEVEL (UNFRACTIONATED): Heparin Unfractionated: 0.56 IU/mL (ref 0.30–0.70)

## 2020-05-11 MED ORDER — POTASSIUM CHLORIDE CRYS ER 20 MEQ PO TBCR
20.0000 meq | EXTENDED_RELEASE_TABLET | Freq: Two times a day (BID) | ORAL | Status: AC
  Administered 2020-05-11 (×2): 20 meq via ORAL
  Filled 2020-05-11 (×2): qty 1

## 2020-05-11 MED ORDER — ENSURE ENLIVE PO LIQD
237.0000 mL | Freq: Two times a day (BID) | ORAL | Status: DC
  Administered 2020-05-12 – 2020-05-16 (×4): 237 mL via ORAL

## 2020-05-11 MED ORDER — ISOSORBIDE MONONITRATE ER 30 MG PO TB24
30.0000 mg | ORAL_TABLET | Freq: Every day | ORAL | Status: DC
  Administered 2020-05-11: 30 mg via ORAL
  Filled 2020-05-11: qty 1

## 2020-05-11 MED ORDER — POTASSIUM CHLORIDE CRYS ER 20 MEQ PO TBCR
40.0000 meq | EXTENDED_RELEASE_TABLET | Freq: Once | ORAL | Status: AC
  Administered 2020-05-11: 40 meq via ORAL
  Filled 2020-05-11: qty 2

## 2020-05-11 MED ORDER — HYDRALAZINE HCL 10 MG PO TABS
20.0000 mg | ORAL_TABLET | Freq: Two times a day (BID) | ORAL | Status: DC
  Administered 2020-05-11 (×2): 20 mg via ORAL
  Filled 2020-05-11 (×2): qty 2

## 2020-05-11 NOTE — Progress Notes (Addendum)
ANTICOAGULATION CONSULT NOTE -  Pharmacy Consult for: heparin dosing  Indication:  PE and afib  No Known Allergies  Patient Measurements: Height: 5\' 8"  (172.7 cm) Weight: 100.5 kg (221 lb 9 oz) IBW/kg (Calculated) : 68.4 Heparin Dosing Weight: 91 kg  Vital Signs: Temp: 98.1 F (36.7 C) (08/18 0400) Temp Source: Oral (08/18 0400) BP: 169/54 (08/18 0500) Pulse Rate: 74 (08/18 0500)  Labs: Recent Labs    05/09/20 0147 05/09/20 0147 05/09/20 0440 05/10/20 0445 05/10/20 1526 05/11/20 0439  HGB 7.7*  7.7*   < > 7.5* 9.6*  --  8.5*  HCT 24.6*  24.7*   < > 23.8* 30.8*  --  27.5*  PLT 215  --   --  270  --  231  HEPARINUNFRC 0.51   < >  --  <0.10* 0.57 0.56  CREATININE 1.27*  --   --  1.89*  --  2.27*   < > = values in this interval not displayed.    Estimated Creatinine Clearance: 32.3 mL/min (A) (by C-G formula based on SCr of 2.27 mg/dL (H)).   Assessment: Pharmacy  consulted to re-start heparin infusion for this 74 yo male for acute PE per CTA of chest. Patient was on heparin earlier this week for atrial fibrillation, but it was discontinued on 8-12 due to ecchymosis of patient's groin region.   HL 0.56, therapeutic   Goal of Therapy:  Heparin level 0.3-0.7 units/ml  Monitor platelets by anticoagulation protocol: Yes   Plan:  Continue  heparin infusion at 1550 units/hr  Check anti-Xa level daily while on heparin Continue to monitor H&H and platelets  Isac Sarna, BS [place]ena [place], BCPS Clinical Pharmacist Pager [phone] 05/11/2020,10:01 AM

## 2020-05-11 NOTE — Progress Notes (Signed)
Checked on patient right after shift change.  Patient was on [place] with an O2 sat of 98%.  No distress was noted.  Bipap is PRN order, will continue to monitor patient, but no need for Bipap at this time.

## 2020-05-11 NOTE — Progress Notes (Signed)
NAME:  Connor Bryant, MRN:  [id number], DOB:  Feb 06, 1946, LOS: 21 ADMISSION DATE:  05/01/2020, CONSULTATION DATE:  8/11 REFERRING MD:  Shah/ Triad, CHIEF COMPLAINT:  resp distress    Brief History   74 yo male former smoker presented to ER with loss of vision in Lt eye and tingling in Lt arm.  Found to have acute infarct in Rt occipital lobe, but was outside window for tPA or thrombectomy.  Also found to have new onset atrial fibrillation.  Developed hypoxia with SpO2 of 52% on 05/03/20.  Required intubation and transferred to ICU.  Past Medical History  PAD s/p bilateral iliac stents, HTN, DM, CKD 3a, Iron deficiency anemia  Significant Hospital Events   8/08 admit 8/10 hypoxia, altered mental status, fever, transfer to ICU 8/11 cardiology consulted 8/13 extubated 8/16 PRBC transfusion; respiratory distress likely form hypervolemia  Consults:  Neurology Cardiology  Procedures:  ETT  8/10 >> 8/13   Significant Diagnostic Tests:   CT head 05/01/20 >> acute Rt occipital infarct  Echo 05/02/20 >> EF 60 o 65%, mild LVH, normal RV  Echo 05/04/20 >> EF 35% with Takotsubo pattern  CT angio chest 05/08/20 >> subsegmental PE RML, b/l Lt lower lobe GGO and ATX  Micro Data:  COVID 8/08 >> negative MRSA PCR 8/10 >> negative Blood 8/10 >> negative Sputum 8/11 >> oral flora Blood 8/12 >> negative  Antimicrobials:  Cefepime 8/10 >> 8/17  Interim history/subjective:  No issues with breathing overnight.  Didn't need Bipap.  Denies chest pain.  Objective   Blood pressure (!) 151/67, pulse 81, temperature 98.1 F (36.7 C), temperature source Oral, resp. rate (!) 26, height 5\' 8"  (1.727 m), weight 100.5 kg, SpO2 96 %.        Intake/Output Summary (Last 24 hours) at 05/11/2020 1400 Last data filed at 05/11/2020 1200 Gross per 24 hour  Intake 591.98 ml  Output 1725 ml  Net -1133.02 ml   Filed Weights   05/09/20 0500 05/10/20 0500 05/11/20 0500  Weight: 99.7 kg 101 kg 100.5 kg     Examination:  General - alert Eyes - pupils reactive ENT - no sinus tenderness, no stridor Cardiac - regular rate/rhythm, 3/6 murmur Chest - equal breath sounds b/l, no wheezing or rales Abdomen - soft, non tender, + bowel sounds Extremities - no cyanosis, clubbing, or edema Skin - no rashes Neuro - normal strength, moves extremities, follows commands Psych - normal mood and behavior   Resolved Hospital Problem list      Assessment & Plan:   Acute hypoxic respiratory failure from acute pulmonary edema and acute pulmonary embolism. Hx of tobacco abuse with presumed COPD. - continue BDs, bronchial hygiene - goal SpO2 > 92% - assess for PFT as outpt through PCP - assess for sleep study to determine presence of obstructive sleep apnea as outpt given comorbid conditions  Acute RML subsegmental PE. - heparin gtt  Acute Rt occipital CVA. - Continue ASA  NSTEMI. Acute systolic CHF with Takotsubo pattern. New onset PAF, present on admission. Hx of HTN, HLD, PAD s/p bilateral iliac stents. - cardiology adding nitrate/hydralazine  CKD 3a. Hypokalemia. - bump in creatinine likely from contrast dye, hypoxia, and diuresis - making urine - lasix held - f/u BMET  Anemia of iron deficiency and critical illness. - f/u CBC - transfuse for Hb < 7 or significant bleeding - if Hb continues to trend down, then might benefit from IV iron replacement >> defer to hospitalist  Fever. - completed ABx 8/17  DM type II poorly controlled with hyperglycemia. - SSI  Best practice:  Diet: Soft diet DVT prophylaxis: heparin gtt GI prophylaxis: protonix Mobility: oob to chair Code Status: DNR Disposition: ICU   PCCM will sign off.  Please call if additional help needed while he is in hospital.  Labs    CMP Latest Ref Rng & Units 05/11/2020 05/10/2020 05/09/2020  Glucose 70 - 99 mg/dL 136(H) 157(H) 138(H)  BUN 8 - 23 mg/dL 38(H) 33(H) 27(H)  Creatinine 0.61 - 1.24 mg/dL 2.27(H)  1.89(H) 1.27(H)  Sodium 135 - 145 mmol/L 138 138 138  Potassium 3.5 - 5.1 mmol/L 3.0(L) 4.6 3.3(L)  Chloride 98 - 111 mmol/L 105 106 105  CO2 22 - 32 mmol/L 23 22 22   Calcium 8.9 - 10.3 mg/dL 8.3(L) 8.5(L) 8.6(L)  Total Protein 6.5 - 8.1 g/dL - - -  Total Bilirubin 0.3 - 1.2 mg/dL - - -  Alkaline Phos 38 - 126 U/L - - -  AST 15 - 41 U/L - - -  ALT 0 - 44 U/L - - -    CBC Latest Ref Rng & Units 05/11/2020 05/10/2020 05/09/2020  WBC 4.0 - 10.5 K/uL 6.3 8.8 -  Hemoglobin 13.0 - 17.0 g/dL 8.5(L) 9.6(L) 7.5(L)  Hematocrit 39 - 52 % 27.5(L) 30.8(L) 23.8(L)  Platelets 150 - 400 K/uL 231 270 -    ABG    Component Value Date/Time   PHART 7.274 (L) 05/10/2020 0025   PCO2ART 52.2 (H) 05/10/2020 0025   PO2ART 229 (H) 05/10/2020 0025   HCO3 21.8 05/10/2020 0025   TCO2 26 03/11/2014 0432   ACIDBASEDEF 2.5 (H) 05/10/2020 0025   O2SAT 99.3 05/10/2020 0025    CBG (last 3)  Recent Labs    05/10/20 2348 05/11/20 0428 05/11/20 0715  GLUCAP 152* 124* 132*    Signature:  Chesley Mires, MD [place] Pager - [phone] 05/11/2020, 2:00 PM

## 2020-05-11 NOTE — Progress Notes (Signed)
Progress Note  Patient Name: Connor Bryant Date of Encounter: 05/11/2020  Primary Cardiologist: Carlyle Dolly, MD  Subjective   States that he feels somewhat better today. Still weak, plans to work with PT. No chest pain or breathlessness at rest. He did sleep better last night.  Inpatient Medications    Scheduled Meds: . sodium chloride   Intravenous Once  . allopurinol  100 mg Oral BID  . aspirin EC  81 mg Oral Daily  . atorvastatin  80 mg Oral Daily  . carvedilol  6.25 mg Oral BID WC  . chlorhexidine gluconate (MEDLINE KIT)  15 mL Mouth Rinse BID  . Chlorhexidine Gluconate Cloth  6 each Topical Daily  . clopidogrel  75 mg Oral Daily  . insulin aspart  0-5 Units Subcutaneous QHS  . insulin aspart  0-9 Units Subcutaneous TID WC  . pantoprazole  40 mg Oral Daily  . polyethylene glycol  8.5 g Oral Daily  . sodium chloride flush  10-40 mL Intracatheter Q12H   Continuous Infusions: . heparin 1,550 Units/hr (05/11/20 0121)   PRN Meds: acetaminophen **OR** acetaminophen, fentaNYL (SUBLIMAZE) injection, hydrALAZINE, ipratropium-albuterol, metoprolol tartrate, morphine injection, sodium chloride flush   Vital Signs    Vitals:   05/11/20 0200 05/11/20 0300 05/11/20 0400 05/11/20 0500  BP: (!) 146/52 (!) 148/55 (!) 134/54 (!) 169/54  Pulse: 70 73 68 74  Resp: 16 17 16 20   Temp:   98.1 F (36.7 C)   TempSrc:   Oral   SpO2: 98% 99% 97% 99%  Weight:    100.5 kg  Height:        Intake/Output Summary (Last 24 hours) at 05/11/2020 0849 Last data filed at 05/11/2020 0500 Gross per 24 hour  Intake 347.36 ml  Output 1300 ml  Net -952.64 ml   Filed Weights   05/09/20 0500 05/10/20 0500 05/11/20 0500  Weight: 99.7 kg 101 kg 100.5 kg    Telemetry    Sinus rhythm. Personally reviewed.  ECG    An ECG dated 05/06/2020 was personally reviewed today and demonstrated:  Sinus rhythm with diffuse ST T wave abnormalities.  Physical Exam   GEN:  Elderly male.  No acute  distress.   Neck:  No JVD. Cardiac:  RRR with 4-3/3 basal systolic murmur, no gallop.  Respiratory:  No wheezing or crackles. GI: Soft, nontender, bowel sounds present. MS:  No edema; No deformity. Neuro:   Nonfocal. Psych: Alert and oriented x 3. Normal affect.  Labs    Chemistry Recent Labs  Lab 05/05/20 0319 05/06/20 0400 05/09/20 0147 05/10/20 0445 05/11/20 0439  NA 141   < > 138 138 138  K 4.2   < > 3.3* 4.6 3.0*  CL 111   < > 105 106 105  CO2 20*   < > 22 22 23   GLUCOSE 143*   < > 138* 157* 136*  BUN 25*   < > 27* 33* 38*  CREATININE 1.51*   < > 1.27* 1.89* 2.27*  CALCIUM 8.4*   < > 8.6* 8.5* 8.3*  PROT 7.0  --   --   --   --   ALBUMIN 3.2*  --   --   --   --   AST 41  --   --   --   --   ALT 19  --   --   --   --   ALKPHOS 55  --   --   --   --  BILITOT 1.4*  --   --   --   --   GFRNONAA  --    < > 55* 34* 27*  GFRAA  --    < > >60 39* 32*  ANIONGAP 10.0   < > 11 10 10    < > = values in this interval not displayed.     Hematology Recent Labs  Lab 05/09/20 0147 05/09/20 0147 05/09/20 0440 05/10/20 0445 05/11/20 0439  WBC 6.0  --   --  8.8 6.3  RBC 2.57*  --   --  3.29* 2.95*  HGB 7.7*  7.7*   < > 7.5* 9.6* 8.5*  HCT 24.6*  24.7*   < > 23.8* 30.8* 27.5*  MCV 96.1  --   --  93.6 93.2  MCH 30.0  --   --  29.2 28.8  MCHC 31.2  --   --  31.2 30.9  RDW 15.2  --   --  16.2* 15.9*  PLT 215  --   --  270 231   < > = values in this interval not displayed.    Cardiac Enzymes Recent Labs  Lab 05/04/20 0442 05/04/20 1622 05/04/20 1948 05/05/20 0319 05/05/20 0752  TROPONINIHS 2,324* 2,506* 2,499* 2,354* 540*    BNP Recent Labs  Lab 05/05/20 0319 05/07/20 0653  BNP 764.0* 487.0*     Radiology    DG Chest 1 View  Result Date: 05/10/2020 CLINICAL DATA:  Chest pain and weakness. EXAM: CHEST  1 VIEW COMPARISON:  Single-view of the chest 05/05/2020. PA and lateral chest 03/15/2014. CT chest 05/08/2020. FINDINGS: There is cardiomegaly and vascular  congestion. No consolidative process, pneumothorax or effusion. Aortic atherosclerosis noted. No acute or focal bony abnormality. IMPRESSION: Cardiomegaly and vascular congestion. Aortic Atherosclerosis (ICD10-I70.0). Electronically Signed   By: Inge Rise M.D.   On: 05/10/2020 13:58    Cardiac Studies   Echocardiogram 05/02/2020: 1. Left ventricular ejection fraction, by estimation, is 60 to 65%. The  left ventricle has normal function. Left ventricular endocardial border  not optimally defined to evaluate regional wall motion. The left  ventricular internal cavity size was mildly  dilated. There is mild left ventricular hypertrophy. Left ventricular  diastolic parameters are indeterminate.  2. Right ventricular systolic function is normal. The right ventricular  size is normal.  3. The mitral valve is abnormal. Trivial mitral valve regurgitation.  4. AV is thickened, calcified with mildly restricted motion. Peak and  mean gradients through the valve are 31 and 18 mm Hg respectively. . The  aortic valve is abnormal. Aortic valve regurgitation is not visualized.   Limited echocardiogram 05/04/2020: 1. The entire apex from mid to distal ventricle is akinetic. Pattern can  be consistent with stress induced cardiomyopathy/Takotsubo CM, however  cannot exclude ischemic etiology   . Left ventricular ejection fraction, by estimation, is 35%. The left  ventricle has moderately decreased function.  2. The inferior vena cava is normal in size with greater than 50%  respiratory variability, suggesting right atrial pressure of 3 mmHg.   Patient Profile     74 y.o. male with past medical history of PAD (s/p prior mesenteric artery bypass and recent bilateral iliac stenting at [facility] on 04/27/2020),HTN, HLD,Type 2 DMand Stage 3 CKD.  Assessment & Plan    1.  NSTEMI, peak high-sensitivity troponin I of 2506.  Occurred in the setting of presentation initially with acute stroke,  then acute pulmonary edema with hypoxic respiratory failure requiring intubation.  Initial echocardiogram on August 9 showed normal LVEF at 60 to 65%, follow-up study after clinical deterioration on August 11 showed LVEF 35% with possibility of stress-induced cardiomyopathy versus ischemic etiology. He remains chest pain-free with plan to continue medical therapy at this point.  2. Acute on chronic renal failure, CKD stage 3b at baseline. His creatinine has bumped up further from 1.89 to 2.27. He did get IV contrast over the weekend in the form of a chest CTA, potentially related. Not on diuretics at this time. No ARB.  3.  Paroxysmal atrial fibrillation documented during hospital stay, new diagnosis.  Currently in sinus rhythm.  CHA2DS2-VASc score is 8.  4.  Acute pulmonary embolus, subsegmental pulmonary artery supplying the right middle lobe, documented August 15 by chest CTA.  Currently on IV heparin.  5.  Acute right occipital stroke at presentation with vision loss left eye.  6.  PAD status post mesenteric artery bypass in the past and more recently bilateral iliac stent intervention at [facility].  8.  Acute anemia, most recent hemoglobin 8.5 down 9.6.   9. Hypokalemia.  Continue aspirin, Plavix, Coreg, and Lipitor. Replete potassium. Hold off on ARB/Aldactone in light of acute renal insufficiency. Will start nitrate/hydralazine combination for afterload reduction in the setting of his cardiomyopathy.  Signed, Rozann Lesches, MD  05/11/2020, 8:49 AM

## 2020-05-11 NOTE — Progress Notes (Signed)
Nutrition Follow-up  DOCUMENTATION CODES:   Obesity unspecified  INTERVENTION:  Ensure Enlive po BID, each supplement provides 350 kcal and 20 grams of protein   NUTRITION DIAGNOSIS:   Inadequate oral intake related to inability to eat as evidenced by NPO status. Progressing; diet advanced to soft  GOAL:   Provide needs based on ASPEN/SCCM guidelines Meeting  MONITOR:   Vent status, TF tolerance, Labs, I & O's, Skin  REASON FOR ASSESSMENT:   Ventilator    ASSESSMENT:   Patient is currently intubated on ventilator support 8/10 worsening dyspnea. CT head findings- acute right medial occipital lobe infarct. Presents with left eye vision problem. New onset atrial fibrillation.  8/08 admit 8/10 hypoxia, altered mental status, fever, transfer to ICU 8/11 cardiology consulted 8/13 extubated 8/16 PRBC transfusion; respiratory distress likely form hypervolemia  Feeling some better today, ongoing weakness, plans to work with PT. Patient with varied po intake, per flowsheets he has consumed 25-100% (56% average) x 4 documented meals. He is on a soft diet, will order Ensure supplement to aid with meeting needs.   Admit wt 103.4 kg   Current wt 100.5 kg I/Os: +5653.6 ml since admit     -712.6 ml x 24 hrs UOP: 1300 ml x 24 hrs  Medications reviewed and include: SSI, Imdur, Protonix, Miralax, Klor-con IV Heparin Labs: CBGs 136,132,124,152, K 3 (L), BUN 38 (H), Cr 2.27 (H), Hgb 8.5 (L), Mg 1.8 (WNL)  Diet Order:   Diet Order            DIET SOFT Room service appropriate? Yes; Fluid consistency: Thin  Diet effective now                 EDUCATION NEEDS:   Not appropriate for education at this time  Skin:  Skin Assessment: Reviewed RN Assessment  Last BM:  8/15  Height:   Ht Readings from Last 1 Encounters:  05/03/20 5\' 8"  (1.727 m)    Weight:   Wt Readings from Last 1 Encounters:  05/11/20 100.5 kg    BMI:  Body mass index is 33.69 kg/m.  Estimated  Nutritional Needs: new s/p extubation  Kcal:  2065-2235  Protein:  100-110  Fluid:  >/= 2 L   Lajuan Lines, RD, LDN Clinical Nutrition After Hours/Weekend Pager # in [place]

## 2020-05-11 NOTE — Progress Notes (Signed)
PROGRESS NOTE    Connor Bryant  YKD:[id number] DOB: 28-Jan-1946 DOA: 05/01/2020 PCP: Claretta Fraise, MD   Brief Narrative:  LZJ:QBHALP C Estesis a 74 y.o.malewith medical history significant forrecent iliac stents, diabetes mellitus, hypertension. Patient presented to the ED today with complaints of losing vision in his left eye,started yesterday.Patient woke up yesterday-Saturday morning with this, and this persisted today. Reports it feels like a dark shadow on the peripheral side ofhis left eye. He also reports some tingling in his left arm-this has resolved. No strokes. He denies weakness of his extremities. No change in speech, no facial asymmetry. Patient took his aspirin and Plavix and statins today. Patient had bilateral iliac stents,2 on the right, one on the left placed about 5 days ago at [facility].  The only cardiac history patient is aware of is a cardiac murmur. He is not aware of history of irregular heart rhythm.  ED Course:Blood pressure systolic 1 1 5-1 2 7, heart rate 49-59,hemoglobin 8.6,recent baseline about 11.Creatinine 1.7, about baseline. Head CT shows acute right occipital infarct. EKG showing atrial fibrillation. Telemetry neurology consulted-patient outside TPA window, stroke work-up.Dualantiplatelet for now. Hospitalist to admit for acute CVA.   Assessment & Plan:   Principal Problem:   Acute CVA (cerebrovascular accident) [facility]) Active Problems:   Peripheral vascular disease ([place])   Gout   Essential hypertension   Type 2 diabetes mellitus without complication, without long-term current use of insulin (HCC)   Pure hypercholesterolemia   S/P insertion of iliac artery stent   PAF (paroxysmal atrial fibrillation) (HCC)   Elevated troponin   Fever   Positive D dimer   Acute respiratory failure with hypoxia (HCC)   Endotracheally intubated   Non-ST elevation (NSTEMI) myocardial infarction ([place])   Acute systolic  heart failure ([place])   Acute hypoxemic respiratory failure status post intubation 8/10-status post extubation -Likely multifactorial with HCAP as well as PE now noted on CT exam 8/15 -DuoNeb for potential component of air trapping with probable COPD -Cefepime discontinued after 8 days of treatment with noted left lower lobe pneumonia -Appreciate ongoing pulmonology recommendations  -Patient currently on 4 L nasal cannula oxygen required BiPAP overnight -Chest x-ray 8/17 with cardiomegaly and pulmonary vascular congestion  PE -Noted on CT chest 8/15 -Currently on IV heparin and monitor hemoglobin levels, if he remained stable with no overt bleeding may consider switch to DOAC and subsequent discharge  Left lower lobe pneumonia -Cefepime discontinued after 8 total days of treatment -Continue to monitor  NSTEMI -Heparindiscontinued due to ecchymosis of her groin region -Continue on Plavix and aspirin with no further Eliquis at this time -Continue atorvastatin -Beta-blocker to resume once blood pressure stabilizes -Limited 2D echocardiogramwith LVEF 35% and apical akinesis. Not a candidate for emergent catheterization at this time. May reflect stress-induced cardiomyopathy versus MI.Cardiology to reconsider catheterization by 8/16 -IV Lasixto be held for now -No need for immediate catheterization, continue to follow per cardiologywith metoprolol initiated today -Troponin downward trending with peak of 2500 -Discussed with cardiology  with plans for cardiac catheterization in the outpatient setting  Acute right occipital CVA with vision loss to left eye -LDL 54 -A1c 6.2% -He was started on Eliquis with recommendation to continue Plavix by his vascular surgeon Dr. Rosana Hoes at [facility] will need 30-day Holter monitoring in outpatient setting -Appreciate neurology recommendations  AKI on CKD stage III -Likely cardiorenal with some volume overload noted from recent  PRBC transfusion -Baseline creatinine 1.2-1.3 -Creatinine trended up today,  likely related to recent diuresis, IV contrast -Monitor repeat labs in a.m. -Hold ARB  Probable paroxysmal atrial fibrillation -Likely to require outpatient 30-day monitor for further evaluation given acute CVA -Heparin drip held for now due to ecchymosis of her groin region -Elevated heart rates noted overnight on 8/12. Started on Lopressor 25 mg 3 times daily by cardiology, this has been increased to 37.5 mg 3 times daily  -IV metoprolol on board for heart rate elevationsas needed  Anemia-chronic iron deficiency anemia noted-downtrending -Improved after 1 unit PRBC transfusion earlier, repeat PRBC transfusion on 8/16 -Plan to follow CBC -No overt bleeding currently noted -Restart heparin 8/17  -Hemoglobin has trended down today, but no reported bleeding -Recheck in a.m.  Hematuria-resolved -Heparin drip discontinued 8/13 -Could be related to traumatic Foley insertion -No significant findings on renal ultrasound  Diabetes, non-insulin-dependent with nephropathy -Holding home oral Metformin and pioglitazone -Continue SSI with stable blood glucose readings noted  History of COPD-Long history of tobacco abuse -We will need outpatient pulmonology follow-up for PFTs -Continue on duo nebs every 6 hours  PVD -Status post mesenteric artery bypass with recent bilateral iliac stenting at [facility] 04/27/2020 -Plan to continue on Eliquis and Plavix outpatient and hold aspirin, per discussion with vascular surgeon at [facility] and once stable -Currently with bruising over the right groin region with pressure dressing applied. Anticoagulation held. -Pressure dressing removed 8/14 and bruising appears to be improving.  Hypertension-stable -Patient on metoprolol per cardiology -Further diuresisheld for now  DVT prophylaxis:Heparin drip Code Status:DNR Family Communication:Discussed with  son 8/16; tried calling on 8/17 with no response Disposition Plan: Status is: Inpatient  Remains inpatient appropriate because:Ongoing diagnostic testing needed not appropriate for outpatient work up, IV treatments appropriate due to intensity of illness or inability to take PO and Inpatient level of care appropriate due to severity of illness   Dispo: The patient is from:Home Anticipated d/c is ON:GEXB with home health physical therapy Anticipated d/c date is:1-2 days Patient currently is not medically stable to d/c.   He needs to have some diuresis today with hypoxemia noted overnight.  Additionally, heparin drip has been started and he will need close H&H monitoring to ensure stability of hemoglobin levels prior to discharge on oral anticoagulation.  Consultants:  Cardiology  Neurology  Pulmonology  Procedures:  See below  Antimicrobials:  Anti-infectives (From admission, onward)   Start     Dose/Rate Route Frequency Ordered Stop   05/04/20 2200  vancomycin (VANCOREADY) IVPB 1250 mg/250 mL  Status:  Discontinued        1,250 mg 166.7 mL/hr over 90 Minutes Intravenous Every 24 hours 05/03/20 2335 05/04/20 0118   05/04/20 1400  ceFEPIme (MAXIPIME) 2 g in sodium chloride 0.9 % 100 mL IVPB  Status:  Discontinued        2 g 200 mL/hr over 30 Minutes Intravenous Every 12 hours 05/04/20 1348 05/10/20 1017   05/04/20 1000  ceFEPIme (MAXIPIME) 2 g in sodium chloride 0.9 % 100 mL IVPB  Status:  Discontinued        2 g 200 mL/hr over 30 Minutes Intravenous Every 12 hours 05/03/20 2335 05/04/20 0942   05/03/20 2315  vancomycin (VANCOREADY) IVPB 2000 mg/400 mL        2,000 mg 200 mL/hr over 120 Minutes Intravenous  Once 05/03/20 2303 05/04/20 0153   05/03/20 2315  ceFEPIme (MAXIPIME) 2 g in sodium chloride 0.9 % 100 mL IVPB  Status:  Discontinued  2 g 200 mL/hr over 30 Minutes Intravenous  Once 05/03/20 2303 05/04/20 0118       Subjective: Feels her breathing is little better today.  No chest pain.  No nausea or vomiting.  Objective: Vitals:   05/11/20 0900 05/11/20 1000 05/11/20 1100 05/11/20 1200  BP: (!) 125/43 (!) 120/50 (!) 138/58 (!) 151/67  Pulse: 79 68 75 81  Resp: 18 16 17  (!) 26  Temp:      TempSrc:      SpO2: 97% 96% 99% 96%  Weight:      Height:        Intake/Output Summary (Last 24 hours) at 05/11/2020 1524 Last data filed at 05/11/2020 1200 Gross per 24 hour  Intake 591.98 ml  Output 1725 ml  Net -1133.02 ml   Filed Weights   05/09/20 0500 05/10/20 0500 05/11/20 0500  Weight: 99.7 kg 101 kg 100.5 kg    Examination:  General exam: Alert, awake, oriented x 3 Respiratory system: Clear to auscultation. Respiratory effort normal. Cardiovascular system:RRR. No murmurs, rubs, gallops. Gastrointestinal system: Abdomen is nondistended, soft and nontender. No organomegaly or masses felt. Normal bowel sounds heard. Central nervous system: Alert and oriented. No focal neurological deficits. Extremities: No C/C/E, +pedal pulses Skin: No rashes, lesions or ulcers Psychiatry: Judgement and insight appear normal. Mood & affect appropriate.      Data Reviewed: I have personally reviewed following labs and imaging studies  CBC: Recent Labs  Lab 05/07/20 0653 05/07/20 0653 05/08/20 0530 05/08/20 0530 05/08/20 1926 05/09/20 0147 05/09/20 0440 05/10/20 0445 05/11/20 0439  WBC 6.5  --  6.1  --   --  6.0  --  8.8 6.3  HGB 10.1*   < > 8.4*   < > 8.6* 7.7*  7.7* 7.5* 9.6* 8.5*  HCT 33.1*   < > 27.1*   < > 28.1* 24.6*  24.7* 23.8* 30.8* 27.5*  MCV 96.8  --  96.1  --   --  96.1  --  93.6 93.2  PLT 255  --  254  --   --  215  --  270 231   < > = values in this interval not displayed.   Basic Metabolic Panel: Recent Labs  Lab 05/07/20 0653 05/08/20 0530 05/09/20 0147 05/10/20 0445 05/11/20 0439  NA 142 140 138 138 138  K 3.2* 3.7 3.3* 4.6 3.0*  CL 103 106 105 106 105  CO2 25  25 22 22 23   GLUCOSE 119* 134* 138* 157* 136*  BUN 29* 29* 27* 33* 38*  CREATININE 1.29* 1.13 1.27* 1.89* 2.27*  CALCIUM 9.2 8.9 8.6* 8.5* 8.3*  MG 1.8 1.7 1.8 1.9 1.8   GFR: Estimated Creatinine Clearance: 32.3 mL/min (A) (by C-G formula based on SCr of 2.27 mg/dL (H)). Liver Function Tests: Recent Labs  Lab 05/05/20 0319  AST 41  ALT 19  ALKPHOS 55  BILITOT 1.4*  PROT 7.0  ALBUMIN 3.2*   No results for input(s): LIPASE, AMYLASE in the last 168 hours. No results for input(s): AMMONIA in the last 168 hours. Coagulation Profile: No results for input(s): INR, PROTIME in the last 168 hours. Cardiac Enzymes: No results for input(s): CKTOTAL, CKMB, CKMBINDEX, TROPONINI in the last 168 hours. BNP (last 3 results) No results for input(s): PROBNP in the last 8760 hours. HbA1C: No results for input(s): HGBA1C in the last 72 hours. CBG: Recent Labs  Lab 05/10/20 1940 05/10/20 2348 05/11/20 0428 05/11/20 0715 05/11/20 1419  GLUCAP 141* 152*  124* 132* 136*   Lipid Profile: Recent Labs    05/09/20 2101  TRIG 90   Thyroid Function Tests: No results for input(s): TSH, T4TOTAL, FREET4, T3FREE, THYROIDAB in the last 72 hours. Anemia Panel: No results for input(s): VITAMINB12, FOLATE, FERRITIN, TIBC, IRON, RETICCTPCT in the last 72 hours. Sepsis Labs: Recent Labs  Lab 05/05/20 0839 05/06/20 0400 05/07/20 0653  PROCALCITON 0.73 0.54 0.35    Recent Results (from the past 240 hour(s))  SARS Coronavirus 2 by RT PCR (hospital order, performed in [facility] hospital lab) Nasopharyngeal Nasopharyngeal Swab     Status: None   Collection Time: 05/01/20  7:43 PM   Specimen: Nasopharyngeal Swab  Result Value Ref Range Status   SARS Coronavirus 2 NEGATIVE NEGATIVE Final    Comment: (NOTE) SARS-CoV-2 target nucleic acids are NOT DETECTED.  The SARS-CoV-2 RNA is generally detectable in upper and lower respiratory specimens during the acute phase of infection. The  lowest concentration of SARS-CoV-2 viral copies this assay can detect is 250 copies / mL. A negative result does not preclude SARS-CoV-2 infection and should not be used as the sole basis for treatment or other patient management decisions.  A negative result may occur with improper specimen collection / handling, submission of specimen other than nasopharyngeal swab, presence of viral mutation(s) within the areas targeted by this assay, and inadequate number of viral copies (<250 copies / mL). A negative result must be combined with clinical observations, patient history, and epidemiological information.  Fact Sheet for Patients:   StrictlyIdeas.no  Fact Sheet for Healthcare Providers: BankingDealers.co.za  This test is not yet approved or  cleared by the [place] FDA and has been authorized for detection and/or diagnosis of SARS-CoV-2 by FDA under an Emergency Use Authorization (EUA).  This EUA will remain in effect (meaning this test can be used) for the duration of the COVID-19 declaration under Section 564(b)(1) of the Act, 21 U.S.C. section 360bbb-3(b)(1), unless the authorization is terminated or revoked sooner.  Performed at [facility], [address], [place], [place] [address]   MRSA PCR Screening     Status: None   Collection Time: 05/03/20  9:46 PM   Specimen: Nasal Mucosa; Nasopharyngeal  Result Value Ref Range Status   MRSA by PCR NEGATIVE NEGATIVE Final    Comment:        The GeneXpert MRSA Assay (FDA approved for NASAL specimens only), is one component of a comprehensive MRSA colonization surveillance program. It is not intended to diagnose MRSA infection nor to guide or monitor treatment for MRSA infections. Performed at [facility], [address], [place], [place] [address]   Culture, blood (x 2)     Status: None   Collection Time: 05/03/20 11:21 PM   Specimen: BLOOD  Result Value Ref Range Status    Specimen Description BLOOD  Final   Special Requests NONE  Final   Culture   Final    NO GROWTH 5 DAYS Performed at [facility], [address], [place], [place] [address]    Report Status 05/08/2020 FINAL  Final  Culture, blood (x 2)     Status: None   Collection Time: 05/03/20 11:26 PM   Specimen: BLOOD RIGHT HAND  Result Value Ref Range Status   Specimen Description BLOOD RIGHT HAND  Final   Special Requests   Final    BOTTLES DRAWN AEROBIC AND ANAEROBIC Blood Culture adequate volume   Culture   Final    NO GROWTH 5  DAYS Performed at [facility], [address] [place], [place] [address]    Report Status 05/08/2020 FINAL  Final  Culture, respiratory (non-expectorated)     Status: None   Collection Time: 05/04/20  1:46 PM   Specimen: Tracheal Aspirate; Respiratory  Result Value Ref Range Status   Specimen Description   Final    TRACHEAL ASPIRATE Performed at [facility], [address] [place], [place] [address]    Special Requests   Final    NONE Performed at [facility], [address] [place], [place] [address]    Gram Stain   Final    FEW WBC PRESENT, PREDOMINANTLY PMN RARE GRAM POSITIVE COCCI RARE GRAM NEGATIVE RODS    Culture   Final    Consistent with normal respiratory flora. Performed at [facility], [place] [address], [place], [place] [address]    Report Status 05/07/2020 FINAL  Final  Culture, blood (routine x 2)     Status: None   Collection Time: 05/05/20  6:03 PM   Specimen: Left Antecubital; Blood  Result Value Ref Range Status   Specimen Description LEFT ANTECUBITAL  Final   Special Requests   Final    BOTTLES DRAWN AEROBIC ONLY Blood Culture adequate volume   Culture   Final    NO GROWTH 6 DAYS Performed at [facility], [address], [place], [place] [address]    Report Status 05/11/2020 FINAL  Final  Culture, blood (routine x 2)     Status: None   Collection Time: 05/05/20  8:10 PM   Specimen: BLOOD LEFT ARM  Result Value Ref Range  Status   Specimen Description BLOOD LEFT ARM  Final   Special Requests   Final    BOTTLES DRAWN AEROBIC AND ANAEROBIC Blood Culture adequate volume   Culture   Final    NO GROWTH 6 DAYS Performed at [facility], [address] [place],  [address]    Report Status 05/11/2020 FINAL  Final         Radiology Studies: DG Chest 1 View  Result Date: 05/10/2020 CLINICAL DATA:  Chest pain and weakness. EXAM: CHEST  1 VIEW COMPARISON:  Single-view of the chest 05/05/2020. PA and lateral chest 03/15/2014. CT chest 05/08/2020. FINDINGS: There is cardiomegaly and vascular congestion. No consolidative process, pneumothorax or effusion. Aortic atherosclerosis noted. No acute or focal bony abnormality. IMPRESSION: Cardiomegaly and vascular congestion. Aortic Atherosclerosis (ICD10-I70.0). Electronically Signed   By: Inge Rise M.D.   On: 05/10/2020 13:58        Scheduled Meds: . sodium chloride   Intravenous Once  . allopurinol  100 mg Oral BID  . aspirin EC  81 mg Oral Daily  . atorvastatin  80 mg Oral Daily  . carvedilol  6.25 mg Oral BID WC  . chlorhexidine gluconate (MEDLINE KIT)  15 mL Mouth Rinse BID  . Chlorhexidine Gluconate Cloth  6 each Topical Daily  . clopidogrel  75 mg Oral Daily  . feeding supplement (ENSURE ENLIVE)  237 mL Oral BID BM  . hydrALAZINE  20 mg Oral BID  . insulin aspart  0-5 Units Subcutaneous QHS  . insulin aspart  0-9 Units Subcutaneous TID WC  . isosorbide mononitrate  30 mg Oral Daily  . pantoprazole  40 mg Oral Daily  . polyethylene glycol  8.5 g Oral Daily  . potassium chloride  20 mEq Oral BID  . sodium chloride flush  10-40 mL Intracatheter Q12H   Continuous Infusions: .  heparin 1,550 Units/hr (05/11/20 0121)     LOS: 10 days    Time spent: 45 minutes    Kathie Dike, MD [facility]  If 7PM-7AM, please contact night-coverage [email or web address] 05/11/2020, 3:24 PM

## 2020-05-11 NOTE — Progress Notes (Addendum)
Physical Therapy Treatment Patient Details Name: Connor Bryant MRN: [id number] DOB: Jul 07, 1946 Today's Date: 05/11/2020    History of Present Illness Connor Bryant is a 74 y.o. male with medical history significant for recent iliac stents, diabetes mellitus, hypertension.Patient presented to the ED today with complaints of losing vision in his left eye, started yesterday.  Patient woke up yesterday- Saturday morning with this, and this persisted today.  Reports it feels like a dark shadow on the peripheral side of his left eye. He also reports some tingling in his left arm-this has resolved.  No strokes.  He denies weakness of his extremities.  No change in speech, no facial asymmetry.  Patient took his aspirin and Plavix and statins today.Patient had bilateral iliac stents, 2 on the right, one on the left placed about 5 days ago at [facility]. The only cardiac history patient is aware of is a cardiac murmur.  He is not aware of history of irregular heart rhythm.    PT Comments    The patient's O2 sat on 3L was 98% at rest and 94% after ambulation. The patient was able to perform supine to sit EOB without LOB or dizziness with modified independence. The patient tolerated therapeutic LE exercises without SOB seated at EOB. The patient demonstrated BLE weakness with sit to stand and was able to ambulate 6 feet with a RW min assist. The patient required VC's to wait for walker in order to ambulate before therapeutic exercises. Patient tolerated sitting up in chair with call bell in arm's reach after therapy. PLAN: The patient will continue to benefit from skilled physical therapy services in hospital at recommended venue below in order to improve balance, gait, and ADL's to promote independence in functional activities.     Follow Up Recommendations  Home health PT;Supervision/Assistance - 24 hour;Supervision for mobility/OOB     Equipment Recommendations  Rolling walker with 5"  wheels    Recommendations for Other Services  None recommended by PT.      Precautions / Restrictions Precautions Precautions: Fall Restrictions Weight Bearing Restrictions: No    Mobility  Bed Mobility Overal bed mobility: Modified Independent             General bed mobility comments: used bed rails, slow labored movement  Transfers Overall transfer level: Needs assistance Equipment used: Rolling walker (2 wheeled) Transfers: Sit to/from Stand Sit to Stand: Min assist         General transfer comment: generalized weakness of BLE, reliant on strength from BUE  Ambulation/Gait Ambulation/Gait assistance: Min assist;Min guard Gait Distance (Feet): 7 Feet Assistive device: Rolling walker (2 wheeled) Gait Pattern/deviations: Trunk flexed;Decreased step length - right;Decreased step length - left;Step-to pattern     General Gait Details: small step gait; slow labored movement   Stairs             Wheelchair Mobility    Modified Rankin (Stroke Patients Only)       Balance Overall balance assessment: Needs assistance   Sitting balance-Leahy Scale: Normal     Standing balance support: Bilateral upper extremity supported;During functional activity Standing balance-Leahy Scale: Fair Standing balance comment: BLE weakness requiring assistance from RW                            Cognition Arousal/Alertness: Awake/alert Behavior During Therapy: [facility] for tasks assessed/performed Overall Cognitive Status: Within Functional Limits for tasks assessed  Exercises General Exercises - Lower Extremity Long Arc Quad: AROM;Seated;Strengthening;Both;10 reps Hip Flexion/Marching: Seated;AROM;Strengthening;Both;10 reps Toe Raises: AROM;Seated;Strengthening;Both;10 reps Heel Raises: Seated;AROM;Strengthening;Both;10 reps    General Comments        Pertinent Vitals/Pain Pain Assessment:  No/denies pain    Home Living                      Prior Function            PT Goals (current goals can now be found in the care plan section) Acute Rehab PT Goals Patient Stated Goal: Return home PT Goal Formulation: With patient/family Time For Goal Achievement: 05/22/20 Potential to Achieve Goals: Good Progress towards PT goals: Progressing toward goals    Frequency    Min 3X/week      PT Plan Current plan remains appropriate    Co-evaluation              AM-PAC PT "6 Clicks" Mobility   Outcome Measure  Help needed turning from your back to your side while in a flat bed without using bedrails?: None Help needed moving from lying on your back to sitting on the side of a flat bed without using bedrails?: A Little Help needed moving to and from a bed to a chair (including a wheelchair)?: A Little Help needed standing up from a chair using your arms (e.g., wheelchair or bedside chair)?: A Little Help needed to walk in hospital room?: A Little Help needed climbing 3-5 steps with a railing? : A Lot 6 Click Score: 18    End of Session Equipment Utilized During Treatment: Gait belt;Oxygen Activity Tolerance: Patient tolerated treatment well;Patient limited by fatigue Patient left: with call bell/phone within reach;in chair Nurse Communication: Mobility status PT Visit Diagnosis: Unsteadiness on feet (R26.81);Other abnormalities of gait and mobility (R26.89);Muscle weakness (generalized) (M62.81) Hemiplegia - Right/Left: Left Hemiplegia - dominant/non-dominant: Non-dominant Hemiplegia - caused by: Cerebral infarction     Time: 6546-5035 PT Time Calculation (min) (ACUTE ONLY): 30 min  Charges:  $Therapeutic Exercise: 8-22 mins $Therapeutic Activity: 8-22 mins                     3:59 PM , 05/11/20 Connor Bryant, SPT Physical Therapy with [facility] [phone] office    This qualified practitioner was present in the room  guiding the student in service delivery. Therapy student was participating in the provision of services, and the practitioner was not engaged in treating another patient or doing other tasks at the same time. 4:00 PM, 05/11/20 Connor Bryant PT, DPT Physical Therapist at [facility]

## 2020-05-11 NOTE — Care Management Important Message (Signed)
Important Message  Patient Details  Name: Connor Bryant MRN: [id number] Date of Birth: 10-18-1945   Medicare Important Message Given:  Yes     Tommy Medal 05/11/2020, 4:15 PM

## 2020-05-12 LAB — RENAL FUNCTION PANEL
Albumin: 2.8 g/dL — ABNORMAL LOW (ref 3.5–5.0)
Anion gap: 12 (ref 5–15)
BUN: 38 mg/dL — ABNORMAL HIGH (ref 8–23)
CO2: 22 mmol/L (ref 22–32)
Calcium: 9 mg/dL (ref 8.9–10.3)
Chloride: 107 mmol/L (ref 98–111)
Creatinine, Ser: 2.19 mg/dL — ABNORMAL HIGH (ref 0.61–1.24)
GFR calc Af Amer: 33 mL/min — ABNORMAL LOW (ref >60)
GFR calc non Af Amer: 28 mL/min — ABNORMAL LOW (ref >60)
Glucose, Bld: 136 mg/dL — ABNORMAL HIGH (ref 70–99)
Phosphorus: 3.2 mg/dL (ref 2.5–4.6)
Potassium: 4.1 mmol/L (ref 3.5–5.1)
Sodium: 141 mmol/L (ref 135–145)

## 2020-05-12 LAB — CBC
HCT: 29.5 % — ABNORMAL LOW (ref 39.0–52.0)
Hemoglobin: 9 g/dL — ABNORMAL LOW (ref 13.0–17.0)
MCH: 28.7 pg (ref 26.0–34.0)
MCHC: 30.5 g/dL (ref 30.0–36.0)
MCV: 93.9 fL (ref 80.0–100.0)
Platelets: 255 10*3/uL (ref 150–400)
RBC: 3.14 MIL/uL — ABNORMAL LOW (ref 4.22–5.81)
RDW: 16 % — ABNORMAL HIGH (ref 11.5–15.5)
WBC: 6.6 10*3/uL (ref 4.0–10.5)
nRBC: 0 % (ref 0.0–0.2)

## 2020-05-12 LAB — GLUCOSE, CAPILLARY
Glucose-Capillary: 121 mg/dL — ABNORMAL HIGH (ref 70–99)
Glucose-Capillary: 131 mg/dL — ABNORMAL HIGH (ref 70–99)
Glucose-Capillary: 133 mg/dL — ABNORMAL HIGH (ref 70–99)
Glucose-Capillary: 135 mg/dL — ABNORMAL HIGH (ref 70–99)
Glucose-Capillary: 137 mg/dL — ABNORMAL HIGH (ref 70–99)
Glucose-Capillary: 138 mg/dL — ABNORMAL HIGH (ref 70–99)

## 2020-05-12 LAB — TRIGLYCERIDES: Triglycerides: 109 mg/dL (ref <150)

## 2020-05-12 LAB — HEPARIN LEVEL (UNFRACTIONATED): Heparin Unfractionated: 0.77 IU/mL — ABNORMAL HIGH (ref 0.30–0.70)

## 2020-05-12 MED ORDER — ISOSORBIDE MONONITRATE ER 30 MG PO TB24
15.0000 mg | ORAL_TABLET | Freq: Every day | ORAL | Status: DC
  Administered 2020-05-12 – 2020-05-17 (×6): 15 mg via ORAL
  Filled 2020-05-12 (×6): qty 1

## 2020-05-12 MED ORDER — APIXABAN 5 MG PO TABS: 5.0000 mg | ORAL_TABLET | Freq: Two times a day (BID) | ORAL | Status: DC

## 2020-05-12 MED ORDER — HYDRALAZINE HCL 10 MG PO TABS
10.0000 mg | ORAL_TABLET | Freq: Two times a day (BID) | ORAL | Status: DC
  Administered 2020-05-12 – 2020-05-17 (×11): 10 mg via ORAL
  Filled 2020-05-12 (×12): qty 1

## 2020-05-12 MED ORDER — APIXABAN 5 MG PO TABS
10.0000 mg | ORAL_TABLET | Freq: Two times a day (BID) | ORAL | Status: DC
  Administered 2020-05-12 (×2): 10 mg via ORAL
  Filled 2020-05-12 (×4): qty 2

## 2020-05-12 NOTE — Progress Notes (Signed)
ANTICOAGULATION CONSULT NOTE -  Pharmacy Consult for: heparin dosing  Indication:  PE and afib  No Known Allergies  Patient Measurements: Height: 5\' 8"  (172.7 cm) Weight: 100.5 kg (221 lb 9 oz) IBW/kg (Calculated) : 68.4 Heparin Dosing Weight: 91 kg  Vital Signs: Temp: 97.6 F (36.4 C) (08/19 0400) Temp Source: Oral (08/19 0400) BP: 160/60 (08/19 0700) Pulse Rate: 70 (08/19 0700)  Labs: Recent Labs    05/10/20 0445 05/10/20 0445 05/10/20 1526 05/11/20 0439 05/12/20 0422  HGB 9.6*   < >  --  8.5* 9.0*  HCT 30.8*  --   --  27.5* 29.5*  PLT 270  --   --  231 255  HEPARINUNFRC <0.10*   < > 0.57 0.56 0.77*  CREATININE 1.89*  --   --  2.27* 2.19*   < > = values in this interval not displayed.    Estimated Creatinine Clearance: 33.5 mL/min (A) (by C-G formula based on SCr of 2.19 mg/dL (H)).   Assessment: Pharmacy  consulted to re-start heparin infusion for this 74 yo male for acute PE per CTA of chest. Patient was on heparin earlier this week for atrial fibrillation, but it was discontinued on 8-12 due to ecchymosis of patient's groin region.   HL 0.77, is supratherapeutic this AM  Goal of Therapy:  Heparin level 0.3-0.7 units/ml  Monitor platelets by anticoagulation protocol: Yes   Plan:  Decrease  heparin infusion at 1450 units/hr  Check anti-Xa level in ~6-8 hours and daily while on heparin Continue to monitor H&H and platelets  Isac Sarna, BS Vena Austria, BCPS Clinical Pharmacist Pager [phone] 05/12/2020,7:40 AM

## 2020-05-12 NOTE — Progress Notes (Signed)
PROGRESS NOTE    Connor Bryant  YIR:[id number] DOB: 11/24/1945 DOA: 05/01/2020 PCP: Claretta Fraise, MD   Brief Narrative:  JKK:[id number] C Estesis a 74 y.o.malewith medical history significant forrecent iliac stents, diabetes mellitus, hypertension. Patient presented to the ED today with complaints of losing vision in his left eye,started yesterday.Patient woke up yesterday-Saturday morning with this, and this persisted today. Reports it feels like a dark shadow on the peripheral side ofhis left eye. He also reports some tingling in his left arm-this has resolved. No strokes. He denies weakness of his extremities. No change in speech, no facial asymmetry. Patient took his aspirin and Plavix and statins today. Patient had bilateral iliac stents,2 on the right, one on the left placed about 5 days ago at [facility].  The only cardiac history patient is aware of is a cardiac murmur. He is not aware of history of irregular heart rhythm.  ED Course:Blood pressure systolic 1 1 5-1 2 7, heart rate 49-59,hemoglobin 8.6,recent baseline about 11.Creatinine 1.7, about baseline. Head CT shows acute right occipital infarct. EKG showing atrial fibrillation. Telemetry neurology consulted-patient outside TPA window, stroke work-up.Dualantiplatelet for now. Hospitalist to admit for acute CVA.   Assessment & Plan:   Principal Problem:   Acute CVA (cerebrovascular accident) [facility]) Active Problems:   Peripheral vascular disease ([place])   Gout   Essential hypertension   Type 2 diabetes mellitus without complication, without long-term current use of insulin (HCC)   Pure hypercholesterolemia   S/P insertion of iliac artery stent   PAF (paroxysmal atrial fibrillation) (HCC)   Elevated troponin   Fever   Positive D dimer   Acute respiratory failure with hypoxia (HCC)   Endotracheally intubated   Non-ST elevation (NSTEMI) myocardial infarction ([place])   Acute systolic  heart failure ([place])   Acute hypoxemic respiratory failure status post intubation 8/10-status post extubation -Likely multifactorial with HCAP as well as PE now noted on CT exam 8/15 -DuoNeb for potential component of air trapping with probable COPD -Cefepime discontinued after 8 days of treatment with noted left lower lobe pneumonia -Appreciate ongoing pulmonology recommendations  -Patient currently on 1 L nasal cannula oxygen and did not require BiPAP overnight -Chest x-ray 8/17 with cardiomegaly and pulmonary vascular congestion -We will try and wean off of oxygen today  PE -Noted on CT chest 8/15 -Currently on IV heparin and hemoglobin has been stable -Transition to Eliquis  Left lower lobe pneumonia -Cefepime discontinued after 8 total days of treatment -Continue to monitor  NSTEMI -Heparindiscontinued due to ecchymosis of her groin region -Continue on Plavix and aspirin with no further Eliquis at this time -Continue atorvastatin -Beta-blocker to resume once blood pressure stabilizes -Limited 2D echocardiogramwith LVEF 35% and apical akinesis. Not a candidate for emergent catheterization at this time. May reflect stress-induced cardiomyopathy versus MI.Cardiology to reconsider catheterization by 8/16 -IV Lasixto be held for now -No need for immediate catheterization, continue to follow per cardiologywith metoprolol initiated today -Troponin downward trending with peak of 2500 -Discussed with cardiology  with plans for cardiac catheterization in the outpatient setting  Acute right occipital CVA with vision loss to left eye -LDL 54 -A1c 6.2% -He was started on Eliquis with recommendation to continue Plavix by his vascular surgeon Dr. Rosana Hoes at [facility] will need 30-day Holter monitoring in outpatient setting -Appreciate neurology recommendations  AKI on CKD stage III -Likely cardiorenal with some volume overload noted from recent PRBC  transfusion -Baseline creatinine 1.2-1.3 -Creatinine stable today, recent  uptrend likely related to recent diuresis, IV contrast -Monitor repeat labs in a.m. -Hold ARB  Probable paroxysmal atrial fibrillation -Likely to require outpatient 30-day monitor for further evaluation given acute CVA -Heparin drip held for now due to ecchymosis of her groin region -Elevated heart rates noted overnight on 8/12. Started on Lopressor 25 mg 3 times daily by cardiology, this has been increased to 37.5 mg 3 times daily  -IV metoprolol on board for heart rate elevationsas needed  Anemia-chronic iron deficiency anemia noted-downtrending -Improved after 1 unit PRBC transfusion earlier, repeat PRBC transfusion on 8/16 -Plan to follow CBC -No overt bleeding currently noted -Restart heparin 8/17  -Hemoglobin stable -Transition to Eliquis  Hematuria-resolved -Heparin drip discontinued 8/13 -Could be related to traumatic Foley insertion -No significant findings on renal ultrasound  Diabetes, non-insulin-dependent with nephropathy -Holding home oral Metformin and pioglitazone -Continue SSI with stable blood glucose readings noted  History of COPD-Long history of tobacco abuse -We will need outpatient pulmonology follow-up for PFTs -Continue on duo nebs every 6 hours  PVD -Status post mesenteric artery bypass with recent bilateral iliac stenting at [facility] 04/27/2020 -Plan to continue on Eliquis and Plavix outpatient and hold aspirin, per discussion with vascular surgeon at [facility] and once stable -Currently with bruising over the right groin region with pressure dressing applied. Anticoagulation held. -Pressure dressing removed 8/14 and bruising appears to be improving.  Hypertension-stable -Patient on metoprolol per cardiology -Further diuresisheld for now  DVT prophylaxis:eliquis Code Status:DNR Family Communication:discussed with patient Disposition Plan: Status  is: Inpatient  Remains inpatient appropriate because:Ongoing diagnostic testing needed not appropriate for outpatient work up, IV treatments appropriate due to intensity of illness or inability to take PO and Inpatient level of care appropriate due to severity of illness   Dispo: The patient is from:Home Anticipated d/c is GB:EEFE with home health physical therapy Anticipated d/c date is:1-2 days Patient currently is not medically stable to d/c.   He needs to have some diuresis today with hypoxemia noted overnight.  Additionally, heparin drip has been started and he will need close H&H monitoring to ensure stability of hemoglobin levels prior to discharge on oral anticoagulation.  Consultants:  Cardiology  Neurology  Pulmonology  Procedures:  See below  Antimicrobials:  Anti-infectives (From admission, onward)   Start     Dose/Rate Route Frequency Ordered Stop   05/04/20 2200  vancomycin (VANCOREADY) IVPB 1250 mg/250 mL  Status:  Discontinued        1,250 mg 166.7 mL/hr over 90 Minutes Intravenous Every 24 hours 05/03/20 2335 05/04/20 0118   05/04/20 1400  ceFEPIme (MAXIPIME) 2 g in sodium chloride 0.9 % 100 mL IVPB  Status:  Discontinued        2 g 200 mL/hr over 30 Minutes Intravenous Every 12 hours 05/04/20 1348 05/10/20 1017   05/04/20 1000  ceFEPIme (MAXIPIME) 2 g in sodium chloride 0.9 % 100 mL IVPB  Status:  Discontinued        2 g 200 mL/hr over 30 Minutes Intravenous Every 12 hours 05/03/20 2335 05/04/20 0942   05/03/20 2315  vancomycin (VANCOREADY) IVPB 2000 mg/400 mL        2,000 mg 200 mL/hr over 120 Minutes Intravenous  Once 05/03/20 2303 05/04/20 0153   05/03/20 2315  ceFEPIme (MAXIPIME) 2 g in sodium chloride 0.9 % 100 mL IVPB  Status:  Discontinued        2 g 200 mL/hr over 30 Minutes Intravenous  Once 05/03/20 2303 05/04/20  0118      Subjective: Did not require BiPAP overnight.  Worked with physical therapy  today.  Feels her breathing is improving.  Objective: Vitals:   05/12/20 1000 05/12/20 1100 05/12/20 1200 05/12/20 1612  BP: (!) 179/81 (!) 150/62 (!) 122/38   Pulse: (!) 58 82    Resp: 20 20 (!) 30 (!) 26  Temp:    98.2 F (36.8 C)  TempSrc:    Oral  SpO2: 95% 95%    Weight:      Height:        Intake/Output Summary (Last 24 hours) at 05/12/2020 2014 Last data filed at 05/12/2020 1120 Gross per 24 hour  Intake 305.65 ml  Output 575 ml  Net -269.35 ml   Filed Weights   05/09/20 0500 05/10/20 0500 05/11/20 0500  Weight: 99.7 kg 101 kg 100.5 kg    Examination:  General exam: Alert, awake, oriented x 3 Respiratory system: Clear to auscultation. Respiratory effort normal. Cardiovascular system:RRR. No murmurs, rubs, gallops. Gastrointestinal system: Abdomen is nondistended, soft and nontender. No organomegaly or masses felt. Normal bowel sounds heard. Central nervous system: Alert and oriented. No focal neurological deficits. Extremities: No C/C/E, +pedal pulses Skin: No rashes, lesions or ulcers Psychiatry: Judgement and insight appear normal. Mood & affect appropriate.      Data Reviewed: I have personally reviewed following labs and imaging studies  CBC: Recent Labs  Lab 05/08/20 0530 05/08/20 1926 05/09/20 0147 05/09/20 0440 05/10/20 0445 05/11/20 0439 05/12/20 0422  WBC 6.1  --  6.0  --  8.8 6.3 6.6  HGB 8.4*   < > 7.7*  7.7* 7.5* 9.6* 8.5* 9.0*  HCT 27.1*   < > 24.6*  24.7* 23.8* 30.8* 27.5* 29.5*  MCV 96.1  --  96.1  --  93.6 93.2 93.9  PLT 254  --  215  --  270 231 255   < > = values in this interval not displayed.   Basic Metabolic Panel: Recent Labs  Lab 05/07/20 0653 05/07/20 0653 05/08/20 0530 05/09/20 0147 05/10/20 0445 05/11/20 0439 05/12/20 0422  NA 142   < > 140 138 138 138 141  K 3.2*   < > 3.7 3.3* 4.6 3.0* 4.1  CL 103   < > 106 105 106 105 107  CO2 25   < > _0 GLUCOSE 119*   < > 134* 138* 157* 136* 136*  BUN 29*    < > 29* 27* 33* 38* 38*  CREATININE 1.29*   < > 1.13 1.27* 1.89* 2.27* 2.19*  CALCIUM 9.2   < > 8.9 8.6* 8.5* 8.3* 9.0  MG 1.8  --  1.7 1.8 1.9 1.8  --   PHOS  --   --   --   --   --   --  3.2   < > = values in this interval not displayed.   GFR: Estimated Creatinine Clearance: 33.5 mL/min (A) (by C-G formula based on SCr of 2.19 mg/dL (H)). Liver Function Tests: Recent Labs  Lab 05/12/20 0422  ALBUMIN 2.8*   No results for input(s): LIPASE, AMYLASE in the last 168 hours. No results for input(s): AMMONIA in the last 168 hours. Coagulation Profile: No results for input(s): INR, PROTIME in the last 168 hours. Cardiac Enzymes: No results for input(s): CKTOTAL, CKMB, CKMBINDEX, TROPONINI in the last 168 hours. BNP (last 3 results) No results for input(s): PROBNP in the last 8760 hours. HbA1C: No results  for input(s): HGBA1C in the last 72 hours. CBG: Recent Labs  Lab 05/12/20 0416 05/12/20 0418 05/12/20 0753 05/12/20 1113 05/12/20 1614  GLUCAP <10* 135* 131* 138* 121*   Lipid Profile: Recent Labs    05/09/20 2101  TRIG 90   Thyroid Function Tests: No results for input(s): TSH, T4TOTAL, FREET4, T3FREE, THYROIDAB in the last 72 hours. Anemia Panel: No results for input(s): VITAMINB12, FOLATE, FERRITIN, TIBC, IRON, RETICCTPCT in the last 72 hours. Sepsis Labs: Recent Labs  Lab 05/06/20 0400 05/07/20 0653  PROCALCITON 0.54 0.35    Recent Results (from the past 240 hour(s))  MRSA PCR Screening     Status: None   Collection Time: 05/03/20  9:46 PM   Specimen: Nasal Mucosa; Nasopharyngeal  Result Value Ref Range Status   MRSA by PCR NEGATIVE NEGATIVE Final    Comment:        The GeneXpert MRSA Assay (FDA approved for NASAL specimens only), is one component of a comprehensive MRSA colonization surveillance program. It is not intended to diagnose MRSA infection nor to guide or monitor treatment for MRSA infections. Performed at [facility], [address], [place], [place] [address]   Culture, blood (x 2)     Status: None   Collection Time: 05/03/20 11:21 PM   Specimen: BLOOD  Result Value Ref Range Status   Specimen Description BLOOD  Final   Special Requests NONE  Final   Culture   Final    NO GROWTH 5 DAYS Performed at [facility], [address], [place], [place] [address]    Report Status 05/08/2020 FINAL  Final  Culture, blood (x 2)     Status: None   Collection Time: 05/03/20 11:26 PM   Specimen: BLOOD RIGHT HAND  Result Value Ref Range Status   Specimen Description BLOOD RIGHT HAND  Final   Special Requests   Final    BOTTLES DRAWN AEROBIC AND ANAEROBIC Blood Culture adequate volume   Culture   Final    NO GROWTH 5 DAYS Performed at [facility], [address] [place], [place] [address]    Report Status 05/08/2020 FINAL  Final  Culture, respiratory (non-expectorated)     Status: None   Collection Time: 05/04/20  1:46 PM   Specimen: Tracheal Aspirate; Respiratory  Result Value Ref Range Status   Specimen Description   Final    TRACHEAL ASPIRATE Performed at [facility], [address], [place], [place] [address]    Special Requests   Final    NONE Performed at [facility], [address] [place], [place] [address]    Gram Stain   Final    FEW WBC PRESENT, PREDOMINANTLY PMN RARE GRAM POSITIVE COCCI RARE GRAM NEGATIVE RODS    Culture   Final    Consistent with normal respiratory flora. Performed at [facility], [place] [address], [place], [place] [address]    Report Status 05/07/2020 FINAL  Final  Culture, blood (routine x 2)     Status: None   Collection Time: 05/05/20  6:03 PM   Specimen: Left Antecubital; Blood  Result Value Ref Range Status   Specimen Description LEFT ANTECUBITAL  Final   Special Requests   Final    BOTTLES DRAWN AEROBIC ONLY Blood Culture adequate volume   Culture   Final    NO GROWTH 6 DAYS Performed at [facility], [address] [place], [place] [address]    Report Status  05/11/2020 FINAL  Final  Culture,  blood (routine x 2)     Status: None   Collection Time: 05/05/20  8:10 PM   Specimen: BLOOD LEFT ARM  Result Value Ref Range Status   Specimen Description BLOOD LEFT ARM  Final   Special Requests   Final    BOTTLES DRAWN AEROBIC AND ANAEROBIC Blood Culture adequate volume   Culture   Final    NO GROWTH 6 DAYS Performed at [facility], [address], [place], [place] [address]    Report Status 05/11/2020 FINAL  Final         Radiology Studies: No results found.      Scheduled Meds: . sodium chloride   Intravenous Once  . allopurinol  100 mg Oral BID  . apixaban  10 mg Oral BID   Followed by  . [START ON 05/19/2020] apixaban  5 mg Oral BID  . aspirin EC  81 mg Oral Daily  . atorvastatin  80 mg Oral Daily  . carvedilol  6.25 mg Oral BID WC  . chlorhexidine gluconate (MEDLINE KIT)  15 mL Mouth Rinse BID  . Chlorhexidine Gluconate Cloth  6 each Topical Daily  . clopidogrel  75 mg Oral Daily  . feeding supplement (ENSURE ENLIVE)  237 mL Oral BID BM  . hydrALAZINE  10 mg Oral BID  . insulin aspart  0-5 Units Subcutaneous QHS  . insulin aspart  0-9 Units Subcutaneous TID WC  . isosorbide mononitrate  15 mg Oral Daily  . pantoprazole  40 mg Oral Daily  . polyethylene glycol  8.5 g Oral Daily  . sodium chloride flush  10-40 mL Intracatheter Q12H   Continuous Infusions:    LOS: 11 days    Time spent: 35 minutes    Kathie Dike, MD [facility]  If 7PM-7AM, please contact night-coverage [email or web address] 05/12/2020, 8:14 PM

## 2020-05-12 NOTE — Progress Notes (Signed)
Progress Note  Patient Name: Connor Bryant Date of Encounter: 05/12/2020  Primary Cardiologist: Carlyle Dolly, MD  Subjective   Got up to bedside chair some yesterday and used his walker with PT.  No chest pain or breathlessness at rest.  Inpatient Medications    Scheduled Meds: . sodium chloride   Intravenous Once  . allopurinol  100 mg Oral BID  . aspirin EC  81 mg Oral Daily  . atorvastatin  80 mg Oral Daily  . carvedilol  6.25 mg Oral BID WC  . chlorhexidine gluconate (MEDLINE KIT)  15 mL Mouth Rinse BID  . Chlorhexidine Gluconate Cloth  6 each Topical Daily  . clopidogrel  75 mg Oral Daily  . feeding supplement (ENSURE ENLIVE)  237 mL Oral BID BM  . hydrALAZINE  10 mg Oral BID  . insulin aspart  0-5 Units Subcutaneous QHS  . insulin aspart  0-9 Units Subcutaneous TID WC  . isosorbide mononitrate  15 mg Oral Daily  . pantoprazole  40 mg Oral Daily  . polyethylene glycol  8.5 g Oral Daily  . sodium chloride flush  10-40 mL Intracatheter Q12H   Continuous Infusions: . heparin 1,450 Units/hr (05/12/20 0746)   PRN Meds: acetaminophen **OR** acetaminophen, fentaNYL (SUBLIMAZE) injection, hydrALAZINE, ipratropium-albuterol, metoprolol tartrate, morphine injection, sodium chloride flush   Vital Signs    Vitals:   05/12/20 0500 05/12/20 0600 05/12/20 0700 05/12/20 0755  BP: (!) 147/67 131/65 (!) 160/60   Pulse: 78 68 70 76  Resp: 19 16 17 18   Temp:    97.8 F (36.6 C)  TempSrc:    Oral  SpO2: 98% 97% 97% 98%  Weight:      Height:        Intake/Output Summary (Last 24 hours) at 05/12/2020 0853 Last data filed at 05/12/2020 0746 Gross per 24 hour  Intake 657.63 ml  Output 1075 ml  Net -417.37 ml   Filed Weights   05/09/20 0500 05/10/20 0500 05/11/20 0500  Weight: 99.7 kg 101 kg 100.5 kg    Telemetry    Sinus rhythm. Personally reviewed.  ECG    An ECG dated 05/06/2020 was personally reviewed today and demonstrated:  Sinus rhythm with diffuse ST T  wave abnormalities.  Physical Exam   GEN:  Elderly male.  No acute distress.   Neck:  No JVD. Cardiac:  RRR with 2/6 basal systolic murmur, no gallop.  Respiratory:  No wheezing, nonlabored. GI: Soft, nontender, bowel sounds present. MS:  No pitting edema; No deformity. Neuro:   Nonfocal. Psych: Alert and oriented x 3. Normal affect.  Labs    Chemistry Recent Labs  Lab 05/10/20 0445 05/11/20 0439 05/12/20 0422  NA 138 138 141  K 4.6 3.0* 4.1  CL 106 105 107  CO2 22 23 22   GLUCOSE 157* 136* 136*  BUN 33* 38* 38*  CREATININE 1.89* 2.27* 2.19*  CALCIUM 8.5* 8.3* 9.0  ALBUMIN  --   --  2.8*  GFRNONAA 34* 27* 28*  GFRAA 39* 32* 33*  ANIONGAP 10 10 12      Hematology Recent Labs  Lab 05/10/20 0445 05/11/20 0439 05/12/20 0422  WBC 8.8 6.3 6.6  RBC 3.29* 2.95* 3.14*  HGB 9.6* 8.5* 9.0*  HCT 30.8* 27.5* 29.5*  MCV 93.6 93.2 93.9  MCH 29.2 28.8 28.7  MCHC 31.2 30.9 30.5  RDW 16.2* 15.9* 16.0*  PLT 270 231 255    Cardiac Enzymes Recent Labs  Lab 05/04/20 0442 05/04/20 1622  05/04/20 1948 05/05/20 0319 05/05/20 0752  TROPONINIHS 2,324* 2,506* 2,499* 2,354* 540*    BNP Recent Labs  Lab 05/07/20 0653  BNP 487.0*     Radiology    DG Chest 1 View  Result Date: 05/10/2020 CLINICAL DATA:  Chest pain and weakness. EXAM: CHEST  1 VIEW COMPARISON:  Single-view of the chest 05/05/2020. PA and lateral chest 03/15/2014. CT chest 05/08/2020. FINDINGS: There is cardiomegaly and vascular congestion. No consolidative process, pneumothorax or effusion. Aortic atherosclerosis noted. No acute or focal bony abnormality. IMPRESSION: Cardiomegaly and vascular congestion. Aortic Atherosclerosis (ICD10-I70.0). Electronically Signed   By: Inge Rise M.D.   On: 05/10/2020 13:58    Cardiac Studies   Echocardiogram 05/02/2020: 1. Left ventricular ejection fraction, by estimation, is 60 to 65%. The  left ventricle has normal function. Left ventricular endocardial border    not optimally defined to evaluate regional wall motion. The left  ventricular internal cavity size was mildly  dilated. There is mild left ventricular hypertrophy. Left ventricular  diastolic parameters are indeterminate.  2. Right ventricular systolic function is normal. The right ventricular  size is normal.  3. The mitral valve is abnormal. Trivial mitral valve regurgitation.  4. AV is thickened, calcified with mildly restricted motion. Peak and  mean gradients through the valve are 31 and 18 mm Hg respectively. . The  aortic valve is abnormal. Aortic valve regurgitation is not visualized.   Limited echocardiogram 05/04/2020: 1. The entire apex from mid to distal ventricle is akinetic. Pattern can  be consistent with stress induced cardiomyopathy/Takotsubo CM, however  cannot exclude ischemic etiology   . Left ventricular ejection fraction, by estimation, is 35%. The left  ventricle has moderately decreased function.  2. The inferior vena cava is normal in size with greater than 50%  respiratory variability, suggesting right atrial pressure of 3 mmHg.   Patient Profile     74 y.o. male with past medical history of PAD (s/p prior mesenteric artery bypass and recent bilateral iliac stenting at [facility] on 04/27/2020),HTN, HLD,Type 2 DMand Stage 3 CKD.  Assessment & Plan    1.  NSTEMI, peak high-sensitivity troponin I of 2506.  Occurred in the setting of presentation initially with acute stroke, then acute pulmonary edema with hypoxic respiratory failure requiring intubation.  Initial echocardiogram on August 9 showed normal LVEF at 60 to 65%, follow-up study after clinical deterioration on August 11 showed LVEF 35% with possibility of stress-induced cardiomyopathy versus ischemic etiology.  No active angina symptoms on medical therapy.  2. Acute on chronic renal failure, CKD stage 3b at baseline. His creatinine has bumped up further from 1.89 to 2.27. He did get IV contrast  over the weekend in the form of a chest CTA, potentially related. Not on diuretics at this time. No ARB.  Creatinine has come down to 2.19.  Still with reasonable urine output.  3.  Paroxysmal atrial fibrillation documented during hospital stay, new diagnosis.  Currently in sinus rhythm.  CHA2DS2-VASc score is 8.  4.  Acute pulmonary embolus, subsegmental pulmonary artery supplying the right middle lobe, documented August 15 by chest CTA.  Currently on IV heparin.  5.  Acute right occipital stroke at presentation with vision loss left eye.  6.  PAD status post mesenteric artery bypass in the past and more recently bilateral iliac stent intervention at [facility].  8.  Acute anemia, hemoglobin has been relatively stable, 9.0 this morning.  No active bleeding visualized.  Discussed with Dr. Roderic Palau.  Continue medical therapy from a cardiac perspective.  He is on aspirin, Plavix, Coreg, Lipitor, recently started on hydralazine/nitrate combination.  Anticipate conversion from IV heparin to Eliquis.  Will probably simplify to Plavix and Eliquis with discontinuation of aspirin depending on hemoglobin.  Signed, Rozann Lesches, MD  05/12/2020, 8:53 AM

## 2020-05-12 NOTE — Progress Notes (Signed)
ANTICOAGULATION CONSULT NOTE -  Pharmacy Consult for: heparin dosing => apixaban  Indication:  PE and afib  No Known Allergies  Patient Measurements: Height: 5\' 8"  (172.7 cm) Weight: 100.5 kg (221 lb 9 oz) IBW/kg (Calculated) : 68.4 Heparin Dosing Weight: 91 kg  Vital Signs: Temp: 97.8 F (36.6 C) (08/19 0755) Temp Source: Oral (08/19 0755) BP: 150/62 (08/19 1100) Pulse Rate: 82 (08/19 1100)  Labs: Recent Labs    05/10/20 0445 05/10/20 0445 05/10/20 1526 05/11/20 0439 05/12/20 0422  HGB 9.6*   < >  --  8.5* 9.0*  HCT 30.8*  --   --  27.5* 29.5*  PLT 270  --   --  231 255  HEPARINUNFRC <0.10*   < > 0.57 0.56 0.77*  CREATININE 1.89*  --   --  2.27* 2.19*   < > = values in this interval not displayed.    Estimated Creatinine Clearance: 33.5 mL/min (A) (by C-G formula based on SCr of 2.19 mg/dL (H)).   Assessment: Pharmacy  consulted to re-start heparin infusion for this 74 yo male for acute PE per CTA of chest. Patient was on heparin earlier this week for atrial fibrillation, but it was discontinued on 8-12 due to ecchymosis of patient's groin region. Patient now ready to transition back to eliquis.   Goal of Therapy:  Heparin level 0.3-0.7 units/ml  Monitor platelets by anticoagulation protocol: Yes   Plan:  Discontinue heparin Eliquis 10mg  po bid x 7 days, then 5mg  po bid  Educate on eliquis Continue to monitor H&H and platelets  Isac Sarna, BS Vena [place], BCPS Clinical Pharmacist Pager [phone] 05/12/2020,11:45 AM

## 2020-05-13 ENCOUNTER — Inpatient Hospital Stay (HOSPITAL_COMMUNITY): Payer: Medicare Other

## 2020-05-13 DIAGNOSIS — I480 Paroxysmal atrial fibrillation: Secondary | ICD-10-CM

## 2020-05-13 DIAGNOSIS — J810 Acute pulmonary edema: Secondary | ICD-10-CM

## 2020-05-13 LAB — GLUCOSE, CAPILLARY
Glucose-Capillary: <10 mg/dL — CL (ref 70–99)
Glucose-Capillary: 128 mg/dL — ABNORMAL HIGH (ref 70–99)
Glucose-Capillary: 226 mg/dL — ABNORMAL HIGH (ref 70–99)
Glucose-Capillary: 148 mg/dL — ABNORMAL HIGH (ref 70–99)
Glucose-Capillary: 121 mg/dL — ABNORMAL HIGH (ref 70–99)
Glucose-Capillary: 130 mg/dL — ABNORMAL HIGH (ref 70–99)

## 2020-05-13 LAB — BLOOD GAS, ARTERIAL
Acid-base deficit: 2.6 mmol/L — ABNORMAL HIGH (ref 0.0–2.0)
Bicarbonate: 22.1 mmol/L (ref 20.0–28.0)
FIO2: 100
O2 Saturation: 99.5 %
Patient temperature: 37.1
pCO2 arterial: 44.4 mmHg (ref 32.0–48.0)
pH, Arterial: 7.326 — ABNORMAL LOW (ref 7.350–7.450)
pO2, Arterial: 407 mmHg — ABNORMAL HIGH (ref 83.0–108.0)

## 2020-05-13 LAB — CBC
HCT: 28.6 % — ABNORMAL LOW (ref 39.0–52.0)
Hemoglobin: 9 g/dL — ABNORMAL LOW (ref 13.0–17.0)
MCH: 29.7 pg (ref 26.0–34.0)
MCHC: 31.5 g/dL (ref 30.0–36.0)
MCV: 94.4 fL (ref 80.0–100.0)
Platelets: 222 10*3/uL (ref 150–400)
RBC: 3.03 MIL/uL — ABNORMAL LOW (ref 4.22–5.81)
RDW: 16 % — ABNORMAL HIGH (ref 11.5–15.5)
WBC: 5.4 10*3/uL (ref 4.0–10.5)
nRBC: 0 % (ref 0.0–0.2)

## 2020-05-13 LAB — RENAL FUNCTION PANEL
Albumin: 2.9 g/dL — ABNORMAL LOW (ref 3.5–5.0)
Anion gap: 10 (ref 5–15)
BUN: 30 mg/dL — ABNORMAL HIGH (ref 8–23)
CO2: 22 mmol/L (ref 22–32)
Calcium: 8.9 mg/dL (ref 8.9–10.3)
Chloride: 109 mmol/L (ref 98–111)
Creatinine, Ser: 1.77 mg/dL — ABNORMAL HIGH (ref 0.61–1.24)
GFR calc Af Amer: 43 mL/min — ABNORMAL LOW (ref >60)
GFR calc non Af Amer: 37 mL/min — ABNORMAL LOW (ref >60)
Glucose, Bld: 150 mg/dL — ABNORMAL HIGH (ref 70–99)
Phosphorus: 3.3 mg/dL (ref 2.5–4.6)
Potassium: 4 mmol/L (ref 3.5–5.1)
Sodium: 141 mmol/L (ref 135–145)

## 2020-05-13 LAB — TROPONIN I (HIGH SENSITIVITY)
Troponin I (High Sensitivity): 99 ng/L — ABNORMAL HIGH (ref <18)
Troponin I (High Sensitivity): 123 ng/L (ref <18)

## 2020-05-13 LAB — HEPARIN LEVEL (UNFRACTIONATED): Heparin Unfractionated: >2.2 IU/mL — ABNORMAL HIGH (ref 0.30–0.70)

## 2020-05-13 LAB — APTT: aPTT: 102 seconds — ABNORMAL HIGH (ref 24–36)

## 2020-05-13 MED ORDER — FUROSEMIDE 10 MG/ML IJ SOLN
40.0000 mg | Freq: Once | INTRAMUSCULAR | Status: AC
  Administered 2020-05-13: 40 mg via INTRAVENOUS
  Filled 2020-05-13: qty 4

## 2020-05-13 MED ORDER — ALBUTEROL SULFATE (2.5 MG/3ML) 0.083% IN NEBU
2.5000 mg | INHALATION_SOLUTION | Freq: Once | RESPIRATORY_TRACT | Status: DC
  Filled 2020-05-13: qty 3

## 2020-05-13 MED ORDER — HEPARIN (PORCINE) 25000 UT/250ML-% IV SOLN
1400.0000 [IU]/h | INTRAVENOUS | Status: DC
  Administered 2020-05-13: 1450 [IU]/h via INTRAVENOUS
  Administered 2020-05-14 (×2): 1400 [IU]/h via INTRAVENOUS
  Filled 2020-05-13 (×3): qty 250

## 2020-05-13 NOTE — Progress Notes (Signed)
PT Cancellation Note  Patient Details Name: Connor Bryant MRN: [id number] DOB: 04-20-1946   Cancelled Treatment:    Reason Eval/Treat Not Completed: Medical issues which prohibited therapy; Patient transferred to a higher level of care and will need new PT consult to resume therapy when patient is medically stable.  Thank you.   11:02 AM, 05/13/20 Mearl Latin PT, DPT Physical Therapist at [facility]

## 2020-05-13 NOTE — Progress Notes (Signed)
ANTICOAGULATION CONSULT NOTE -  Pharmacy Consult for: heparin dosing  Indication:  PE and afib  No Known Allergies  Patient Measurements: Height: 5\' 8"  (172.7 cm) Weight: 98.7 kg (217 lb 9.5 oz) IBW/kg (Calculated) : 68.4 Heparin Dosing Weight: 91 kg  Vital Signs: Temp: 98.8 F (37.1 C) (08/20 0550) Temp Source: Oral (08/20 0111) BP: 170/80 (08/20 0550) Pulse Rate: 79 (08/20 0550)  Labs: Recent Labs    05/10/20 1526 05/11/20 0439 05/11/20 0439 05/12/20 0422 05/13/20 0500 05/13/20 0553  HGB  --  8.5*   < > 9.0*  --  9.0*  HCT  --  27.5*  --  29.5*  --  28.6*  PLT  --  231  --  255  --  222  HEPARINUNFRC 0.57 0.56  --  0.77*  --   --   CREATININE  --  2.27*  --  2.19* 1.77*  --    < > = values in this interval not displayed.    Estimated Creatinine Clearance: 41.1 mL/min (A) (by C-G formula based on SCr of 1.77 mg/dL (H)).   Assessment: Pharmacy  consulted to re-start heparin infusion for this 74 yo male for acute PE per CTA of chest. Patient was on heparin earlier this week for atrial fibrillation, but it was discontinued on 8-12 due to ecchymosis of patient's groin region.   Patient restarting on heparin  Goal of Therapy:  Heparin level 0.3-0.7 units/ml  Monitor platelets by anticoagulation protocol: Yes   Plan:  Stop apixaban Restart  heparin infusion at 1450 units/hr  Check anti-Xa level in ~8 hours and daily while on heparin Continue to monitor H&H and platelets  Margot Ables, PharmD Clinical Pharmacist 05/13/2020 11:03 AM

## 2020-05-13 NOTE — Progress Notes (Signed)
CRITICAL VALUE ALERT  Critical Value:  Troponin 123  Date & Time Notied:  05/13/20 @ 1220.  Provider Notified: Roderic Palau, MD.  Orders Received/Actions taken: Awaiting orders.

## 2020-05-13 NOTE — Evaluation (Signed)
Occupational Therapy Evaluation Patient Details Name: Connor Bryant MRN: [id number] DOB: [birth year] Today's Date: 05/13/2020    History of Present Illness 8/11: Patient patient admitted with lateral vision loss to left eye with noted acute right-sided CVA.  Overnight he has developed acute hypoxemic respiratory failure and required intubation on mechanical ventilator.  He has been noted to have troponin elevation suggestive of NSTEMI and has been started on heparin drip.  Cardiology following as well as pulmonology for vent management.  There is suspicion he may have also aspirated with potential for HCAP   Clinical Impression   Pt agreeable to OT evaluation this am. Pt is performing bed mobility independently, ADLs in sitting with set-up. Pt fatigues easily, min guard for functional mobility, requiring increased time for task completion. Pt educated on energy conservation strategies to employ on return home. Reports son can assist as needed. No further acute OT needs at this time.     Follow Up Recommendations  No OT follow up    Equipment Recommendations  None recommended by OT       Precautions / Restrictions Precautions Precautions: Fall Restrictions Weight Bearing Restrictions: No      Mobility Bed Mobility Overal bed mobility: Modified Independent             General bed mobility comments: used bed rails, slow labored movement  Transfers                          ADL either performed or assessed with clinical judgement   ADL Overall ADL's : Needs assistance/impaired                                       General ADL Comments: Pt requiring increased time for tasks and seated rest breaks due to poor activity tolerance     Vision Baseline Vision/History: Wears glasses Wears Glasses: At all times Patient Visual Report: Peripheral vision impairment Visual Fields: Left homonymous hemianopsia            Pertinent Vitals/Pain Pain  Assessment: No/denies pain     Hand Dominance Right   Extremity/Trunk Assessment Upper Extremity Assessment Upper Extremity Assessment: Generalized weakness   Lower Extremity Assessment Lower Extremity Assessment: Defer to PT evaluation   Cervical / Trunk Assessment Cervical / Trunk Assessment: Normal   Communication Communication Communication: No difficulties   Cognition Arousal/Alertness: Awake/alert Behavior During Therapy: WFL for tasks assessed/performed Overall Cognitive Status: Within Functional Limits for tasks assessed                                                Home Living Family/patient expects to be discharged to:: Private residence Living Arrangements: Spouse/significant other Available Help at Discharge: Family;Available PRN/intermittently Type of Home: House Home Access: Stairs to enter [employer] of Steps: 1 Entrance Stairs-Rails: None Home Layout: One level     Bathroom Shower/Tub: Occupational psychologist: Standard     Home Equipment: Cane - single point          Prior Functioning/Environment Level of Independence: Independent        Comments: pt reports independence in mobility, ADLs, driving        OT Problem List: Impaired vision/perception  OT Treatment/Interventions:      OT Goals(Current goals can be found in the care plan section) Acute Rehab OT Goals Patient Stated Goal: Return home  OT Frequency:                   End of Session    Activity Tolerance: Patient tolerated treatment well Patient left: in bed;with call bell/phone within reach  OT Visit Diagnosis: Muscle weakness (generalized) (M62.81)                Time: 5247-9980 OT Time Calculation (min): 14 min Charges:  OT General Charges $OT Visit: 1 Visit OT Evaluation $OT Eval Low Complexity: [place], OTR/L  [phone] 05/13/2020, 8:29 AM

## 2020-05-13 NOTE — Progress Notes (Signed)
When rounding after initial assessment, pt bed alarm sounded alerting me to enter room finding pt short of breath, hypoxic with O2 sat of 70%, diaphoretic and tachycardic. At this time, the provider entered the room, providing orders. The respiratory therapy team was called and began assessing and providing care. A EKG and chest x-ray completed immediately after. Pt was placed on Bipap with O2 sats improving to 96%. Pt was then stabilized and transferred to the ICU, report given at bedside.

## 2020-05-13 NOTE — Care Management Important Message (Signed)
Important Message  Patient Details  Name: Connor Bryant MRN: [id number] Date of Birth: 05/16/1946   Medicare Important Message Given:  Yes     Tommy Medal 05/13/2020, 3:56 PM

## 2020-05-13 NOTE — Progress Notes (Signed)
PROGRESS NOTE    Connor Bryant  QXI:[id number] DOB: [birth year] DOA: 05/01/2020 PCP: Claretta Fraise, MD   Brief Narrative:  [id number] C Estesis a 74 y.o.malewith medical history significant forrecent iliac stents, diabetes mellitus, hypertension. Patient presented to the ED today with complaints of losing vision in his left eye,started yesterday.Patient woke up yesterday-Saturday morning with this, and this persisted today. Reports it feels like a dark shadow on the peripheral side ofhis left eye. He also reports some tingling in his left arm-this has resolved. No strokes. He denies weakness of his extremities. No change in speech, no facial asymmetry. Patient took his aspirin and Plavix and statins today. Patient had bilateral iliac stents,2 on the right, one on the left placed about 5 days ago at [facility].  The only cardiac history patient is aware of is a cardiac murmur. He is not aware of history of irregular heart rhythm.  ED Course:Blood pressure systolic 1 1 5-1 2 7, heart rate 49-59,hemoglobin 8.6,recent baseline about 11.Creatinine 1.7, about baseline. Head CT shows acute right occipital infarct. EKG showing atrial fibrillation. Telemetry neurology consulted-patient outside TPA window, stroke work-up.Dualantiplatelet for now. Hospitalist to admit for acute CVA.   Assessment & Plan:   Principal Problem:   Acute CVA (cerebrovascular accident) [facility]) Active Problems:   Peripheral vascular disease ([place])   Gout   Essential hypertension   Type 2 diabetes mellitus without complication, without long-term current use of insulin (HCC)   Pure hypercholesterolemia   S/P insertion of iliac artery stent   PAF (paroxysmal atrial fibrillation) (HCC)   Elevated troponin   Fever   Positive D dimer   Acute respiratory failure with hypoxia (HCC)   Endotracheally intubated   Non-ST elevation (NSTEMI) myocardial infarction ([place])   Acute systolic  heart failure (HCC)   Acute hypoxemic respiratory failure status post intubation 8/10-status post extubation -Likely multifactorial with HCAP as well as PE now noted on CT exam 8/15 -DuoNeb for potential component of air trapping with probable COPD -Cefepime discontinued after 8 days of treatment with noted left lower lobe pneumonia -Appreciate ongoing pulmonology recommendations  -Patient was initially doing well and was weaned off oxygen, but then on 8/20 developed acute decompensation and had to be put back on bipap -chest xray shows CHF pattern -EKG with sinus tachycardia -cycle troponins -Check ABG  Acute systolic CHF -EF of 05% on recent echo -lasix held for last 2 days due to rising creatinine -with acute resp decompensation, will restart lasix today -continue beta blocker, imdur and hydralazine  PE -Noted on CT chest 8/15 -Currently on IV heparin and hemoglobin has been stable  Left lower lobe pneumonia -Cefepime discontinued after 8 total days of treatment -Continue to monitor  NSTEMI -Continue on Plavix and aspirin  -Continue atorvastatin -Beta-blocker to resume once blood pressure stabilizes -Limited 2D echocardiogramwith LVEF 35% and apical akinesis. Not a candidate for emergent catheterization at this time. May reflect stress-induced cardiomyopathy versus MI. -Troponin downward trending with peak of 2500 -Discussed with cardiology  with plans for cardiac catheterization in the outpatient setting  Acute right occipital CVA with vision loss to left eye -LDL 54 -A1c 6.2% -He was started on Eliquis with recommendation to continue Plavix by his vascular surgeon Dr. Rosana Hoes at [place] will need 30-day Holter monitoring in outpatient setting -Appreciate neurology recommendations  AKI on CKD stage III -Likely cardiorenal with some volume overload noted from recent PRBC transfusion -Baseline creatinine 1.2-1.3 -Creatinine with mild improvement  today  to 1.7, recent uptrend with peak of 2.2 likely related to recent diuresis, IV contrast -Monitor repeat labs in a.m. -Hold ARB  Probable paroxysmal atrial fibrillation -Likely to require outpatient 30-day monitor for further evaluation given acute CVA -Elevated heart rates noted overnight on 8/12. Started on Lopressor 25 mg 3 times daily by cardiology, this has been increased to 37.5 mg 3 times daily  -IV metoprolol on board for heart rate elevationsas needed -anticoagulation with heparin.  Anemia-chronic iron deficiency anemia noted-downtrending -Improved after 1 unit PRBC transfusion earlier, repeat PRBC transfusion on 8/16 -Plan to follow CBC -No overt bleeding currently noted -Restart heparin 8/17  -Hemoglobin stable   Hematuria-resolved -Heparin drip discontinued 8/13 -Could be related to traumatic Foley insertion -No significant findings on renal ultrasound  Diabetes, non-insulin-dependent with nephropathy -Holding home oral Metformin and pioglitazone -Continue SSI with stable blood glucose readings noted  History of COPD-Long history of tobacco abuse -We will need outpatient pulmonology follow-up for PFTs -Continue on duo nebs every 6 hours  PVD -Status post mesenteric artery bypass with recent bilateral iliac stenting at [facility] 04/27/2020 -Plan to continue on Eliquis and Plavix outpatient and hold aspirin, per discussion with vascular surgeon at [facility] and once stable -Currently with bruising over the right groin region with pressure dressing applied. Anticoagulation held. -Pressure dressing removed 8/14 and bruising appears to be improving.  Hypertension-stable -Patient on metoprolol per cardiology  DVT prophylaxis:heparin infusion Code Status:DNR Family Communication:updated son over the phone Disposition Plan: Status is: Inpatient  Remains inpatient appropriate because:Ongoing diagnostic testing needed not appropriate for  outpatient work up, IV treatments appropriate due to intensity of illness or inability to take PO and Inpatient level of care appropriate due to severity of illness   Dispo: The patient is from:Home Anticipated d/c is DU:KGUR with home health physical therapy Anticipated d/c date is:1-2 days Patient currently is not medically stable to d/c.   Patient to be transferred back to SDU for bipap for worsening shortness of breath and CHF  Consultants:  Cardiology  Neurology  Pulmonology  Procedures:  See below  Antimicrobials:  Anti-infectives (From admission, onward)   Start     Dose/Rate Route Frequency Ordered Stop   05/04/20 2200  vancomycin (VANCOREADY) IVPB 1250 mg/250 mL  Status:  Discontinued        1,250 mg 166.7 mL/hr over 90 Minutes Intravenous Every 24 hours 05/03/20 2335 05/04/20 0118   05/04/20 1400  ceFEPIme (MAXIPIME) 2 g in sodium chloride 0.9 % 100 mL IVPB  Status:  Discontinued        2 g 200 mL/hr over 30 Minutes Intravenous Every 12 hours 05/04/20 1348 05/10/20 1017   05/04/20 1000  ceFEPIme (MAXIPIME) 2 g in sodium chloride 0.9 % 100 mL IVPB  Status:  Discontinued        2 g 200 mL/hr over 30 Minutes Intravenous Every 12 hours 05/03/20 2335 05/04/20 0942   05/03/20 2315  vancomycin (VANCOREADY) IVPB 2000 mg/400 mL        2,000 mg 200 mL/hr over 120 Minutes Intravenous  Once 05/03/20 2303 05/04/20 0153   05/03/20 2315  ceFEPIme (MAXIPIME) 2 g in sodium chloride 0.9 % 100 mL IVPB  Status:  Discontinued        2 g 200 mL/hr over 30 Minutes Intravenous  Once 05/03/20 2303 05/04/20 0118      Subjective: Became acutely short of breath in the last hour. Was diaphoretic. No complaints of chest pain. Bipap applied by  respiratory.  Objective: Vitals:   05/12/20 2105 05/13/20 0111 05/13/20 0550 05/13/20 0935  BP: (!) 147/63 (!) 151/75 (!) 170/80   Pulse: 84 84 79   Resp: _0 Temp: 98.6 F (37 C) 98.7 F  (37.1 C) 98.8 F (37.1 C)   TempSrc: Oral Oral    SpO2: 98% 96% 93% 93%  Weight: 98.7 kg     Height: 5' 8" (1.727 m)       Intake/Output Summary (Last 24 hours) at 05/13/2020 0953 Last data filed at 05/13/2020 0948 Gross per 24 hour  Intake 10 ml  Output 325 ml  Net -315 ml   Filed Weights   05/10/20 0500 05/11/20 0500 05/12/20 2105  Weight: 101 kg 100.5 kg 98.7 kg    Examination:  General exam: Alert, awake, oriented x 3 Respiratory system: bilateral wheezes and crackles. Increased resp effort Cardiovascular system:regular, tachycardic. No murmurs, rubs, gallops. Gastrointestinal system: Abdomen is nondistended, soft and nontender. No organomegaly or masses felt. Normal bowel sounds heard. Central nervous system: Alert and oriented. No focal neurological deficits. Extremities: 1+ pedal edema bilaterally Skin: No rashes, lesions or ulcers Psychiatry: Judgement and insight appear normal. Mood & affect appropriate.        Data Reviewed: I have personally reviewed following labs and imaging studies  CBC: Recent Labs  Lab 05/09/20 0147 05/09/20 0147 05/09/20 0440 05/10/20 0445 05/11/20 0439 05/12/20 0422 05/13/20 0553  WBC 6.0  --   --  8.8 6.3 6.6 5.4  HGB 7.7*  7.7*   < > 7.5* 9.6* 8.5* 9.0* 9.0*  HCT 24.6*  24.7*   < > 23.8* 30.8* 27.5* 29.5* 28.6*  MCV 96.1  --   --  93.6 93.2 93.9 94.4  PLT 215  --   --  270 231 255 222   < > = values in this interval not displayed.   Basic Metabolic Panel: Recent Labs  Lab 05/07/20 0653 05/07/20 0653 05/08/20 0530 05/08/20 0530 05/09/20 0147 05/10/20 0445 05/11/20 0439 05/12/20 0422 05/13/20 0500  NA 142   < > 140   < > 138 138 138 141 141  K 3.2*   < > 3.7   < > 3.3* 4.6 3.0* 4.1 4.0  CL 103   < > 106   < > 105 106 105 107 109  CO2 25   < > 25   < > _1 GLUCOSE 119*   < > 134*   < > 138* 157* 136* 136* 150*  BUN 29*   < > 29*   < > 27* 33* 38* 38* 30*  CREATININE 1.29*   < > 1.13   < > 1.27*  1.89* 2.27* 2.19* 1.77*  CALCIUM 9.2   < > 8.9   < > 8.6* 8.5* 8.3* 9.0 8.9  MG 1.8  --  1.7  --  1.8 1.9 1.8  --   --   PHOS  --   --   --   --   --   --   --  3.2 3.3   < > = values in this interval not displayed.   GFR: Estimated Creatinine Clearance: 41.1 mL/min (A) (by C-G formula based on SCr of 1.77 mg/dL (H)). Liver Function Tests: Recent Labs  Lab 05/12/20 0422 05/13/20 0500  ALBUMIN 2.8* 2.9*   No results for input(s): LIPASE, AMYLASE in the last 168 hours. No results for input(s): AMMONIA in the last 168 hours.  Coagulation Profile: No results for input(s): INR, PROTIME in the last 168 hours. Cardiac Enzymes: No results for input(s): CKTOTAL, CKMB, CKMBINDEX, TROPONINI in the last 168 hours. BNP (last 3 results) No results for input(s): PROBNP in the last 8760 hours. HbA1C: No results for input(s): HGBA1C in the last 72 hours. CBG: Recent Labs  Lab 05/12/20 0753 05/12/20 1113 05/12/20 1614 05/12/20 2020 05/13/20 0115  GLUCAP 131* 138* 121* 133* 128*   Lipid Profile: Recent Labs    05/12/20 2041  TRIG 109   Thyroid Function Tests: No results for input(s): TSH, T4TOTAL, FREET4, T3FREE, THYROIDAB in the last 72 hours. Anemia Panel: No results for input(s): VITAMINB12, FOLATE, FERRITIN, TIBC, IRON, RETICCTPCT in the last 72 hours. Sepsis Labs: Recent Labs  Lab 05/07/20 0653  PROCALCITON 0.35    Recent Results (from the past 240 hour(s))  MRSA PCR Screening     Status: None   Collection Time: 05/03/20  9:46 PM   Specimen: Nasal Mucosa; Nasopharyngeal  Result Value Ref Range Status   MRSA by PCR NEGATIVE NEGATIVE Final    Comment:        The GeneXpert MRSA Assay (FDA approved for NASAL specimens only), is one component of a comprehensive MRSA colonization surveillance program. It is not intended to diagnose MRSA infection nor to guide or monitor treatment for MRSA infections. Performed at [facility], [address], [place], [place] [address]    Culture, blood (x 2)     Status: None   Collection Time: 05/03/20 11:21 PM   Specimen: BLOOD  Result Value Ref Range Status   Specimen Description BLOOD  Final   Special Requests NONE  Final   Culture   Final    NO GROWTH 5 DAYS Performed at [facility], [address] [place], [place] [address]    Report Status 05/08/2020 FINAL  Final  Culture, blood (x 2)     Status: None   Collection Time: 05/03/20 11:26 PM   Specimen: BLOOD RIGHT HAND  Result Value Ref Range Status   Specimen Description BLOOD RIGHT HAND  Final   Special Requests   Final    BOTTLES DRAWN AEROBIC AND ANAEROBIC Blood Culture adequate volume   Culture   Final    NO GROWTH 5 DAYS Performed at [facility], [address], [place], [place] [address]    Report Status 05/08/2020 FINAL  Final  Culture, respiratory (non-expectorated)     Status: None   Collection Time: 05/04/20  1:46 PM   Specimen: Tracheal Aspirate; Respiratory  Result Value Ref Range Status   Specimen Description   Final    TRACHEAL ASPIRATE Performed at [facility], [address], [place], [place] [address]    Special Requests   Final    NONE Performed at [facility], [address], [place], [place] [address]    Gram Stain   Final    FEW WBC PRESENT, PREDOMINANTLY PMN RARE GRAM POSITIVE COCCI RARE GRAM NEGATIVE RODS    Culture   Final    Consistent with normal respiratory flora. Performed at [facility], [place] [address] [place], [place] [address]    Report Status 05/07/2020 FINAL  Final  Culture, blood (routine x 2)     Status: None   Collection Time: 05/05/20  6:03 PM   Specimen: Left Antecubital; Blood  Result Value Ref Range Status   Specimen Description LEFT ANTECUBITAL  Final   Special Requests   Final    BOTTLES DRAWN  AEROBIC ONLY Blood Culture adequate volume   Culture   Final    NO GROWTH 6 DAYS Performed at [facility], [address], [place], [place] [address]    Report Status 05/11/2020 FINAL  Final   Culture, blood (routine x 2)     Status: None   Collection Time: 05/05/20  8:10 PM   Specimen: BLOOD LEFT ARM  Result Value Ref Range Status   Specimen Description BLOOD LEFT ARM  Final   Special Requests   Final    BOTTLES DRAWN AEROBIC AND ANAEROBIC Blood Culture adequate volume   Culture   Final    NO GROWTH 6 DAYS Performed at [facility], [address] [place], [place] [address]    Report Status 05/11/2020 FINAL  Final         Radiology Studies: [facility] Chest Port 1 View  Result Date: 05/13/2020 CLINICAL DATA:  Increased shortness of breath EXAM: PORTABLE CHEST 1 VIEW COMPARISON:  Three days ago FINDINGS: Cardiomegaly and vascular pedicle widening. Diffuse interstitial opacity with cephalized flow. No definite or large effusion. No pneumothorax. Healed left mid clavicle fracture. IMPRESSION: CHF pattern. Electronically Signed   By: Monte Fantasia M.D.   On: 05/13/2020 09:31        Scheduled Meds: . sodium chloride   Intravenous Once  . albuterol  2.5 mg Nebulization Once  . allopurinol  100 mg Oral BID  . aspirin EC  81 mg Oral Daily  . atorvastatin  80 mg Oral Daily  . carvedilol  6.25 mg Oral BID WC  . chlorhexidine gluconate (MEDLINE KIT)  15 mL Mouth Rinse BID  . Chlorhexidine Gluconate Cloth  6 each Topical Daily  . clopidogrel  75 mg Oral Daily  . feeding supplement (ENSURE ENLIVE)  237 mL Oral BID BM  . hydrALAZINE  10 mg Oral BID  . insulin aspart  0-5 Units Subcutaneous QHS  . insulin aspart  0-9 Units Subcutaneous TID WC  . isosorbide mononitrate  15 mg Oral Daily  . pantoprazole  40 mg Oral Daily  . polyethylene glycol  8.5 g Oral Daily  . sodium chloride flush  10-40 mL Intracatheter Q12H   Continuous Infusions:    LOS: 12 days    Critical care procedure note Authorized and performed by: Kathie Dike Total critical care time: Approximately 30 minutes Due to high probability of clinically significant, life-threatening deterioration, the  patient required my highest level of preparedness to intervene emergently and I personally spent this critical care time directly and personally managing the patient.  The critical care time included obtaining a history, examining the patient, pulse oximetry, ordering and review of studies, arranging urgent treatment with development of a management plan, evaluation of patient's response to treatment, frequent reassessment, discussions with other providers.  Critical care time was performed to assess and manage the high probability of imminent, life-threatening deterioration that could result in multiorgan failure.  It was exclusive of separate billable procedures and treating other patients and teaching time.  Please see MDM section and the rest of the of note for further information on patient assessment and treatment     Kathie Dike, MD [facility]  If 7PM-7AM, please contact night-coverage [email or web address] 05/13/2020, 9:53 AM

## 2020-05-13 NOTE — Progress Notes (Signed)
Progress Note  Patient Name: Connor Bryant Date of Encounter: 05/13/2020  Primary Cardiologist: Carlyle Dolly, MD   Subjective   Arrived on rounds to find patient sitting on side of bed, short of breath and diaphoretic, also tachycardic and hypoxic with nursing assessment ongoing in the room.  Patient states that he had a "bad night," did not sleep well.  No chest pain reported but states that he has had some cough and became acutely short of breath within the last hour.  Inpatient Medications    Scheduled Meds: . sodium chloride   Intravenous Once  . albuterol  2.5 mg Nebulization Once  . allopurinol  100 mg Oral BID  . aspirin EC  81 mg Oral Daily  . atorvastatin  80 mg Oral Daily  . carvedilol  6.25 mg Oral BID WC  . chlorhexidine gluconate (MEDLINE KIT)  15 mL Mouth Rinse BID  . Chlorhexidine Gluconate Cloth  6 each Topical Daily  . clopidogrel  75 mg Oral Daily  . feeding supplement (ENSURE ENLIVE)  237 mL Oral BID BM  . furosemide  40 mg Intravenous Once  . hydrALAZINE  10 mg Oral BID  . insulin aspart  0-5 Units Subcutaneous QHS  . insulin aspart  0-9 Units Subcutaneous TID WC  . isosorbide mononitrate  15 mg Oral Daily  . pantoprazole  40 mg Oral Daily  . polyethylene glycol  8.5 g Oral Daily  . sodium chloride flush  10-40 mL Intracatheter Q12H    PRN Meds: acetaminophen **OR** acetaminophen, fentaNYL (SUBLIMAZE) injection, hydrALAZINE, ipratropium-albuterol, metoprolol tartrate, morphine injection, sodium chloride flush   Vital Signs    Vitals:   05/12/20 2017 05/12/20 2105 05/13/20 0111 05/13/20 0550  BP:  (!) 147/63 (!) 151/75 (!) 170/80  Pulse:  84 84 79  Resp:  _0 Temp:  98.6 F (37 C) 98.7 F (37.1 C) 98.8 F (37.1 C)  TempSrc:  Oral Oral   SpO2: 92% 98% 96% 93%  Weight:  98.7 kg    Height:  _1  (1.727 m)      Intake/Output Summary (Last 24 hours) at 05/13/2020 0929 Last data filed at 05/13/2020 0900 Gross per 24 hour  Intake --   Output 325 ml  Net -325 ml    Last 3 Weights 05/12/2020 05/11/2020 05/10/2020  Weight (lbs) 217 lb 9.5 oz 221 lb 9 oz 222 lb 10.6 oz  Weight (kg) 98.7 kg 100.5 kg 101 kg      Telemetry    Sinus tachycardia. Personally Reviewed  ECG    ECG at bedside today shows sinus tachycardia with increased voltage, nonspecific ST-T changes.  Physical Exam   General: Elderly male, short of breath and diaphoretic. Head: Normocephalic, atraumatic. Neck: Difficult to assess JVP. Lungs: Diffuse crackles. Heart: Rapid regular rhythm, no obvious gallop. Abdomen: Protuberant, nontender. Extremities: No pitting edema.  Labs    Chemistry Recent Labs  Lab 05/11/20 0439 05/12/20 0422 05/13/20 0500  NA 138 141 141  K 3.0* 4.1 4.0  CL 105 107 109  CO2 _2 GLUCOSE 136* 136* 150*  BUN 38* 38* 30*  CREATININE 2.27* 2.19* 1.77*  CALCIUM 8.3* 9.0 8.9  ALBUMIN  --  2.8* 2.9*  GFRNONAA 27* 28* 37*  GFRAA 32* 33* 43*  ANIONGAP _3 Hematology Recent Labs  Lab 05/11/20 0439 05/12/20 0422 05/13/20 0553  WBC 6.3 6.6 5.4  RBC 2.95* 3.14* 3.03*  HGB 8.5*  9.0* 9.0*  HCT 27.5* 29.5* 28.6*  MCV 93.2 93.9 94.4  MCH 28.8 28.7 29.7  MCHC 30.9 30.5 31.5  RDW 15.9* 16.0* 16.0*  PLT 231 255 222    BNP Recent Labs  Lab 05/07/20 0653  BNP 487.0*     Radiology    No results found.  Cardiac Studies   Echocardiogram: 05/02/2020 IMPRESSIONS   1. Left ventricular ejection fraction, by estimation, is 60 to 65%. The  left ventricle has normal function. Left ventricular endocardial border  not optimally defined to evaluate regional wall motion. The left  ventricular internal cavity size was mildly  dilated. There is mild left ventricular hypertrophy. Left ventricular  diastolic parameters are indeterminate.  2. Right ventricular systolic function is normal. The right ventricular  size is normal.  3. The mitral valve is abnormal. Trivial mitral valve regurgitation.  4.  AV is thickened, calcified with mildly restricted motion. Peak and  mean gradients through the valve are 31 and 18 mm Hg respectively. . The  aortic valve is abnormal. Aortic valve regurgitation is not visualized.   Limited Echo: 05/04/2020 IMPRESSIONS   1. The entire apex from mid to distal ventricle is akinetic. Pattern can  be consistent with stress induced cardiomyopathy/Takotsubo CM, however  cannot exclude ischemic etiology   . Left ventricular ejection fraction, by estimation, is 35%. The left  ventricle has moderately decreased function.  2. The inferior vena cava is normal in size with greater than 50%  respiratory variability, suggesting right atrial pressure of 3 mmHg.   Patient Profile     74 y.o. male w/PMH of PAD (s/p prior mesenteric artery bypass and recent bilateral iliac stenting at [facility] on 04/27/2020),HTN, HLD,Type 2 DMand Stage 3 CKD who presented with left-sided vision loss and was found to have an acute CVA. Cardiology consulted due to NSTEMI (trop peak 2506) and repeat echo showing EF reduced to 35%. Also diagnosed with acute PE on 8/15.  Assessment & Plan    1.  Acute hypoxic respiratory failure, crackles on examination, likely flash pulmonary edema.  Symptom onset this morning.  No active chest pain reported.  Stat chest x-ray and ECG ordered (no STEMI), respiratory called to bedside with initiation of BiPAP and initial stabilization and improvement in oxygenation.  Similar to event discussed in problem #2.  2. NSTEMI, peak high-sensitivity troponin I of 2506.  Occurred in the setting of presentation initially with acute stroke, then acute pulmonary edema with hypoxic respiratory failure requiring intubation.  Initial echocardiogram on August 9 showed normal LVEF at 60 to 65%, follow-up study after clinical deterioration on August 11 showed LVEF 35% with possibility of stress-induced cardiomyopathy versus ischemic etiology.   3.  Acute on chronic renal  failure with CKD stage 3b at baseline.  Recent creatinine has come down to 1.77.  Diuretics have been held and he is not on an ARB.  He did get IV contrast this past weekend with chest CTA.  4.  Paroxysmal atrial fibrillation, documented earlier in hospital stay without recent recurrence.  CHA2DS2-VASc score is 8.  5.  Acute pulmonary embolus, subsegmental pulmonary artery supplying the right middle lobe.  This was documented by chest CTA on August 15.  Recently on heparin and transitioned to Eliquis by primary team.  6.  Acute right occipital stroke at presentation.  7.  PAD status post mesenteric artery bypass in the past and more recently bilateral iliac stent intervention at [facility].  8.  Acute anemia, hemoglobin has  been stable recently at 9.0.  No active bleeding reported.  Discussed case with Dr. Roderic Palau at the bedside.  Plan to transfer patient back to the ICU for further stabilization, would get Pulmonary reinvolved in his case as well.  Support with BiPAP for now, resume IV Lasix.  Switch from Eliquis back to heparin for now, continue aspirin and Plavix.  Otherwise on Coreg, hydralazine/Imdur which will be uptitrated.  Cardiac catheterization has been deferred in light of complex acute medical illnesses as discussed above (stroke, acute PE, acute on chronic renal failure, acute anemia).  He had been doing well with gradual improvement this past week on medical therapy.  Would cycle continued cardiac enzymes.  If there is further substantial increase would consider a repeat limited echocardiogram.  Would consider transferring him to the critical care team at [facility] if his overall clinical picture does not improve quickly with above intervention.  Signed, Satira Sark, M.D., F.A.C.C. 9:29 AM 05/13/2020

## 2020-05-14 LAB — CBC
HCT: 30.2 % — ABNORMAL LOW (ref 39.0–52.0)
Hemoglobin: 9.3 g/dL — ABNORMAL LOW (ref 13.0–17.0)
MCH: 29.2 pg (ref 26.0–34.0)
MCHC: 30.8 g/dL (ref 30.0–36.0)
MCV: 95 fL (ref 80.0–100.0)
Platelets: 233 10*3/uL (ref 150–400)
RBC: 3.18 MIL/uL — ABNORMAL LOW (ref 4.22–5.81)
RDW: 16.2 % — ABNORMAL HIGH (ref 11.5–15.5)
WBC: 6 10*3/uL (ref 4.0–10.5)
nRBC: 0 % (ref 0.0–0.2)

## 2020-05-14 LAB — BASIC METABOLIC PANEL
Anion gap: 10 (ref 5–15)
BUN: 31 mg/dL — ABNORMAL HIGH (ref 8–23)
CO2: 24 mmol/L (ref 22–32)
Calcium: 8.9 mg/dL (ref 8.9–10.3)
Chloride: 106 mmol/L (ref 98–111)
Creatinine, Ser: 1.95 mg/dL — ABNORMAL HIGH (ref 0.61–1.24)
GFR calc Af Amer: 38 mL/min — ABNORMAL LOW (ref >60)
GFR calc non Af Amer: 33 mL/min — ABNORMAL LOW (ref >60)
Glucose, Bld: 140 mg/dL — ABNORMAL HIGH (ref 70–99)
Potassium: 3.8 mmol/L (ref 3.5–5.1)
Sodium: 140 mmol/L (ref 135–145)

## 2020-05-14 LAB — GLUCOSE, CAPILLARY
Glucose-Capillary: 112 mg/dL — ABNORMAL HIGH (ref 70–99)
Glucose-Capillary: 113 mg/dL — ABNORMAL HIGH (ref 70–99)
Glucose-Capillary: 118 mg/dL — ABNORMAL HIGH (ref 70–99)
Glucose-Capillary: 126 mg/dL — ABNORMAL HIGH (ref 70–99)
Glucose-Capillary: 128 mg/dL — ABNORMAL HIGH (ref 70–99)
Glucose-Capillary: 152 mg/dL — ABNORMAL HIGH (ref 70–99)
Glucose-Capillary: 165 mg/dL — ABNORMAL HIGH (ref 70–99)

## 2020-05-14 LAB — APTT: aPTT: 87 seconds — ABNORMAL HIGH (ref 24–36)

## 2020-05-14 MED ORDER — FUROSEMIDE 40 MG PO TABS
40.0000 mg | ORAL_TABLET | Freq: Every day | ORAL | Status: DC
  Administered 2020-05-14 – 2020-05-17 (×4): 40 mg via ORAL
  Filled 2020-05-14 (×4): qty 1

## 2020-05-14 NOTE — Progress Notes (Signed)
PROGRESS NOTE    Connor Bryant  NFA:[id number] DOB: Feb 12, 1946 DOA: 05/01/2020 PCP: Claretta Fraise, MD   Brief Narrative:  ION:GEXBMW C Estesis a 74 y.o.malewith medical history significant forrecent iliac stents, diabetes mellitus, hypertension. Patient presented to the ED today with complaints of losing vision in his left eye,started yesterday.Patient woke up yesterday-Saturday morning with this, and this persisted today. Reports it feels like a dark shadow on the peripheral side ofhis left eye. He also reports some tingling in his left arm-this has resolved. No strokes. He denies weakness of his extremities. No change in speech, no facial asymmetry. Patient took his aspirin and Plavix and statins today. Patient had bilateral iliac stents,2 on the right, one on the left placed about 5 days ago at [facility].  The only cardiac history patient is aware of is a cardiac murmur. He is not aware of history of irregular heart rhythm.  ED Course:Blood pressure systolic 1 1 5-1 2 7, heart rate 49-59,hemoglobin 8.6,recent baseline about 11.Creatinine 1.7, about baseline. Head CT shows acute right occipital infarct. EKG showing atrial fibrillation. Telemetry neurology consulted-patient outside TPA window, stroke work-up.Dualantiplatelet for now. Hospitalist to admit for acute CVA.   Assessment & Plan:   Principal Problem:   Acute CVA (cerebrovascular accident) [facility]) Active Problems:   Peripheral vascular disease ([place])   Gout   Essential hypertension   Type 2 diabetes mellitus without complication, without long-term current use of insulin (HCC)   Pure hypercholesterolemia   S/P insertion of iliac artery stent   PAF (paroxysmal atrial fibrillation) (HCC)   Elevated troponin   Fever   Positive D dimer   Acute respiratory failure with hypoxia (HCC)   Endotracheally intubated   Non-ST elevation (NSTEMI) myocardial infarction ([place])   Acute systolic  heart failure (HCC)   Acute hypoxemic respiratory failure status post intubation 8/10-status post extubation -Likely multifactorial with HCAP as well as PE now noted on CT exam 8/15 -DuoNeb for potential component of air trapping with probable COPD -Cefepime discontinued after 8 days of treatment with noted left lower lobe pneumonia -Appreciate ongoing pulmonology recommendations  -Patient was initially doing well and was weaned off oxygen, but then on 8/20 developed acute decompensation and had to be put back on bipap -chest xray shows CHF pattern -EKG with sinus tachycardia -cycle troponins = no significant rise -He received Lasix with good urine output and improvement of respiratory status -Currently on nasal cannula oxygen. -Continue on Lasix and wean off oxygen as tolerated  Acute systolic CHF -EF of 41% on recent echo -lasix held for last 2 days due to rising creatinine -Overall respirations improved after receiving IV Lasix -With mild bump in creatinine, will change to oral Lasix -continue beta blocker, imdur and hydralazine  PE -Noted on CT chest 8/15 -Currently on IV heparin and hemoglobin has been stable  Left lower lobe pneumonia -Cefepime discontinued after 8 total days of treatment -Continue to monitor  NSTEMI -Continue on Plavix and aspirin  -Continue atorvastatin -Beta-blocker to resume once blood pressure stabilizes -Limited 2D echocardiogramwith LVEF 35% and apical akinesis. Not a candidate for emergent catheterization at this time. May reflect stress-induced cardiomyopathy versus MI. -Troponin downward trending with peak of 2500 -Discussed with cardiology  with plans for cardiac catheterization in the outpatient setting  Acute right occipital CVA with vision loss to left eye -LDL 54 -A1c 6.2% -He was started on Eliquis with recommendation to continue Plavix by his vascular surgeon Dr. Rosana Hoes at [place]  will need 30-day Holter monitoring  in outpatient setting -Appreciate neurology recommendations  AKI on CKD stage III -Likely cardiorenal with some volume overload noted from recent PRBC transfusion -Baseline creatinine 1.2-1.3 -Creatinine peak of 2.2 likely related to recent diuresis, IV contrast.  Currently creatinine 1.9 -Monitor repeat labs in a.m. -Hold ARB  Probable paroxysmal atrial fibrillation -Likely to require outpatient 30-day monitor for further evaluation given acute CVA -Elevated heart rates noted overnight on 8/12. Started on Lopressor 25 mg 3 times daily by cardiology, this has been increased to 37.5 mg 3 times daily  -IV metoprolol on board for heart rate elevationsas needed -anticoagulation with heparin.  Anemia-chronic iron deficiency anemia noted-downtrending -Improved after 1 unit PRBC transfusion earlier, repeat PRBC transfusion on 8/16 -Plan to follow CBC -No overt bleeding currently noted -Restart heparin 8/17  -Hemoglobin stable   Hematuria-resolved -Heparin drip discontinued 8/13 -Could be related to traumatic Foley insertion -No significant findings on renal ultrasound  Diabetes, non-insulin-dependent with nephropathy -Holding home oral Metformin and pioglitazone -Continue SSI with stable blood glucose readings noted  History of COPD-Long history of tobacco abuse -We will need outpatient pulmonology follow-up for PFTs -Continue on duo nebs every 6 hours  PVD -Status post mesenteric artery bypass with recent bilateral iliac stenting at [facility] 04/27/2020 -Plan to continue on Eliquis and Plavix outpatient and hold aspirin, per discussion with vascular surgeon at [facility] and once stable -Currently with bruising over the right groin region with pressure dressing applied. Anticoagulation held. -Pressure dressing removed 8/14 and bruising appears to be improving.  Hypertension-stable -Patient on metoprolol per cardiology  DVT prophylaxis:heparin infusion Code  Status:DNR Family Communication:updated son over the phone 8/20 Disposition Plan: Status is: Inpatient  Remains inpatient appropriate because:Ongoing diagnostic testing needed not appropriate for outpatient work up, IV treatments appropriate due to intensity of illness or inability to take PO and Inpatient level of care appropriate due to severity of illness   Dispo: The patient is from:Home Anticipated d/c is SA:YTKZ with home health physical therapy Anticipated d/c date is:1-2 days Patient currently is not medically stable to d/c.   Patient needs continued management for decompensated CHF and respiratory failure.  Consultants:  Cardiology  Neurology  Pulmonology  Procedures:  See below  Antimicrobials:  Anti-infectives (From admission, onward)   Start     Dose/Rate Route Frequency Ordered Stop   05/04/20 2200  vancomycin (VANCOREADY) IVPB 1250 mg/250 mL  Status:  Discontinued        1,250 mg 166.7 mL/hr over 90 Minutes Intravenous Every 24 hours 05/03/20 2335 05/04/20 0118   05/04/20 1400  ceFEPIme (MAXIPIME) 2 g in sodium chloride 0.9 % 100 mL IVPB  Status:  Discontinued        2 g 200 mL/hr over 30 Minutes Intravenous Every 12 hours 05/04/20 1348 05/10/20 1017   05/04/20 1000  ceFEPIme (MAXIPIME) 2 g in sodium chloride 0.9 % 100 mL IVPB  Status:  Discontinued        2 g 200 mL/hr over 30 Minutes Intravenous Every 12 hours 05/03/20 2335 05/04/20 0942   05/03/20 2315  vancomycin (VANCOREADY) IVPB 2000 mg/400 mL        2,000 mg 200 mL/hr over 120 Minutes Intravenous  Once 05/03/20 2303 05/04/20 0153   05/03/20 2315  ceFEPIme (MAXIPIME) 2 g in sodium chloride 0.9 % 100 mL IVPB  Status:  Discontinued        2 g 200 mL/hr over 30 Minutes Intravenous  Once 05/03/20 2303  05/04/20 0118      Subjective: Feeling better today.  Overall shortness of breath is better.  No cough.  Objective: Vitals:   05/14/20 1300 05/14/20  1400 05/14/20 1500 05/14/20 1700  BP: (!) 173/61 (!) 131/56 (!) 148/59 (!) 149/61  Pulse: 81 73 65 75  Resp: 20 15 13  (!) 25  Temp:      TempSrc:      SpO2: 98% 98% 99% 97%  Weight:      Height:        Intake/Output Summary (Last 24 hours) at 05/14/2020 1719 Last data filed at 05/14/2020 1700 Gross per 24 hour  Intake 1133.96 ml  Output 500 ml  Net 633.96 ml   Filed Weights   05/11/20 0500 05/12/20 2105 05/14/20 0331  Weight: 100.5 kg 98.7 kg 98 kg    Examination:  General exam: Alert, awake, oriented x 3 Respiratory system: Crackles at bases. Respiratory effort normal. Cardiovascular system: Irregular. No murmurs, rubs, gallops. Gastrointestinal system: Abdomen is nondistended, soft and nontender. No organomegaly or masses felt. Normal bowel sounds heard. Central nervous system: Alert and oriented. No focal neurological deficits. Extremities: 1+ lower extremity edema bilaterally Skin: No rashes, lesions or ulcers Psychiatry: Judgement and insight appear normal. Mood & affect appropriate.    Data Reviewed: I have personally reviewed following labs and imaging studies  CBC: Recent Labs  Lab 05/10/20 0445 05/11/20 0439 05/12/20 0422 05/13/20 0553 05/14/20 0828  WBC 8.8 6.3 6.6 5.4 6.0  HGB 9.6* 8.5* 9.0* 9.0* 9.3*  HCT 30.8* 27.5* 29.5* 28.6* 30.2*  MCV 93.6 93.2 93.9 94.4 95.0  PLT 270 231 255 222 536   Basic Metabolic Panel: Recent Labs  Lab 05/08/20 0530 05/08/20 0530 05/09/20 0147 05/09/20 0147 05/10/20 0445 05/11/20 0439 05/12/20 0422 05/13/20 0500 05/14/20 0828  NA 140   < > 138   < > 138 138 141 141 140  K 3.7   < > 3.3*   < > 4.6 3.0* 4.1 4.0 3.8  CL 106   < > 105   < > 106 105 107 109 106  CO2 25   < > 22   < > 22 23 22 22 24   GLUCOSE 134*   < > 138*   < > 157* 136* 136* 150* 140*  BUN 29*   < > 27*   < > 33* 38* 38* 30* 31*  CREATININE 1.13   < > 1.27*   < > 1.89* 2.27* 2.19* 1.77* 1.95*  CALCIUM 8.9   < > 8.6*   < > 8.5* 8.3* 9.0 8.9 8.9    MG 1.7  --  1.8  --  1.9 1.8  --   --   --   PHOS  --   --   --   --   --   --  3.2 3.3  --    < > = values in this interval not displayed.   GFR: Estimated Creatinine Clearance: 37.1 mL/min (A) (by C-G formula based on SCr of 1.95 mg/dL (H)). Liver Function Tests: Recent Labs  Lab 05/12/20 0422 05/13/20 0500  ALBUMIN 2.8* 2.9*   No results for input(s): LIPASE, AMYLASE in the last 168 hours. No results for input(s): AMMONIA in the last 168 hours. Coagulation Profile: No results for input(s): INR, PROTIME in the last 168 hours. Cardiac Enzymes: No results for input(s): CKTOTAL, CKMB, CKMBINDEX, TROPONINI in the last 168 hours. BNP (last 3 results) No results for input(s): PROBNP in the  last 8760 hours. HbA1C: No results for input(s): HGBA1C in the last 72 hours. CBG: Recent Labs  Lab 05/13/20 2359 05/14/20 0330 05/14/20 0748 05/14/20 1143 05/14/20 1619  GLUCAP 112* 118* 126* 152* 128*   Lipid Profile: Recent Labs    05/12/20 2041  TRIG 109   Thyroid Function Tests: No results for input(s): TSH, T4TOTAL, FREET4, T3FREE, THYROIDAB in the last 72 hours. Anemia Panel: No results for input(s): VITAMINB12, FOLATE, FERRITIN, TIBC, IRON, RETICCTPCT in the last 72 hours. Sepsis Labs: No results for input(s): PROCALCITON, LATICACIDVEN in the last 168 hours.  Recent Results (from the past 240 hour(s))  Culture, blood (routine x 2)     Status: None   Collection Time: 05/05/20  6:03 PM   Specimen: Left Antecubital; Blood  Result Value Ref Range Status   Specimen Description LEFT ANTECUBITAL  Final   Special Requests   Final    BOTTLES DRAWN AEROBIC ONLY Blood Culture adequate volume   Culture   Final    NO GROWTH 6 DAYS Performed at [facility], [address], [place], [place] [address]    Report Status 05/11/2020 FINAL  Final  Culture, blood (routine x 2)     Status: None   Collection Time: 05/05/20  8:10 PM   Specimen: BLOOD LEFT ARM  Result Value Ref Range  Status   Specimen Description BLOOD LEFT ARM  Final   Special Requests   Final    BOTTLES DRAWN AEROBIC AND ANAEROBIC Blood Culture adequate volume   Culture   Final    NO GROWTH 6 DAYS Performed at [facility], [address] [place], [place] [address]    Report Status 05/11/2020 FINAL  Final         Radiology Studies: [facility] Chest Port 1 View  Result Date: 05/13/2020 CLINICAL DATA:  Increased shortness of breath EXAM: PORTABLE CHEST 1 VIEW COMPARISON:  Three days ago FINDINGS: Cardiomegaly and vascular pedicle widening. Diffuse interstitial opacity with cephalized flow. No definite or large effusion. No pneumothorax. Healed left mid clavicle fracture. IMPRESSION: CHF pattern. Electronically Signed   By: Monte Fantasia M.D.   On: 05/13/2020 09:31        Scheduled Meds: . sodium chloride   Intravenous Once  . albuterol  2.5 mg Nebulization Once  . allopurinol  100 mg Oral BID  . aspirin EC  81 mg Oral Daily  . atorvastatin  80 mg Oral Daily  . carvedilol  6.25 mg Oral BID WC  . chlorhexidine gluconate (MEDLINE KIT)  15 mL Mouth Rinse BID  . Chlorhexidine Gluconate Cloth  6 each Topical Daily  . clopidogrel  75 mg Oral Daily  . feeding supplement (ENSURE ENLIVE)  237 mL Oral BID BM  . furosemide  40 mg Oral Daily  . hydrALAZINE  10 mg Oral BID  . insulin aspart  0-5 Units Subcutaneous QHS  . insulin aspart  0-9 Units Subcutaneous TID WC  . isosorbide mononitrate  15 mg Oral Daily  . pantoprazole  40 mg Oral Daily  . polyethylene glycol  8.5 g Oral Daily  . sodium chloride flush  10-40 mL Intracatheter Q12H   Continuous Infusions: . heparin 1,400 Units/hr (05/14/20 0629)     LOS: 13 days    Critical care procedure note Authorized and performed by: Kathie Dike Total critical care time: Approximately 30 minutes Due to high probability of clinically significant, life-threatening deterioration, the patient required my highest level of preparedness to intervene  emergently and I  personally spent this critical care time directly and personally managing the patient.  The critical care time included obtaining a history, examining the patient, pulse oximetry, ordering and review of studies, arranging urgent treatment with development of a management plan, evaluation of patient's response to treatment, frequent reassessment, discussions with other providers.  Critical care time was performed to assess and manage the high probability of imminent, life-threatening deterioration that could result in multiorgan failure.  It was exclusive of separate billable procedures and treating other patients and teaching time.  Please see MDM section and the rest of the of note for further information on patient assessment and treatment     Kathie Dike, MD [facility]  If 7PM-7AM, please contact night-coverage [email or web address] 05/14/2020, 5:19 PM

## 2020-05-14 NOTE — Progress Notes (Signed)
ANTICOAGULATION CONSULT NOTE  Pharmacy Consult for: heparin dosing  Indication:  PE and afib  No Known Allergies  Patient Measurements: Height: 5\' 8"  (172.7 cm) Weight: 98 kg (216 lb 0.8 oz) IBW/kg (Calculated) : 68.4 Heparin Dosing Weight: 91 kg  Vital Signs: Temp: 98.1 F (36.7 C) (08/21 0331) Temp Source: Oral (08/21 0331) BP: 186/65 (08/21 0800) Pulse Rate: 83 (08/21 0900)  Labs: Recent Labs    05/12/20 0422 05/12/20 0422 05/13/20 0500 05/13/20 0553 05/13/20 0938 05/13/20 1132 05/13/20 2220 05/14/20 0828  HGB 9.0*   < >  --  9.0*  --   --   --  9.3*  HCT 29.5*  --   --  28.6*  --   --   --  30.2*  PLT 255  --   --  222  --   --   --  233  APTT  --   --   --   --   --   --  102* 87*  HEPARINUNFRC 0.77*  --   --   --   --   --  >2.20*  --   CREATININE 2.19*  --  1.77*  --   --   --   --   --   TROPONINIHS  --   --   --   --  99* 123*  --   --    < > = values in this interval not displayed.    Estimated Creatinine Clearance: 40.9 mL/min (A) (by C-G formula based on SCr of 1.77 mg/dL (H)).   Assessment: Pharmacy  consulted to re-start heparin infusion 8/20 for this 74 yo male for acute PE per CTA of chest. Patient was on heparin earlier this week for atrial fibrillation, but it was discontinued on 8-12 due to ecchymosis of patient's groin region.  Heparin level elevated (>2.2) due to apixaban so using PTT for monitoring until levels correlate.  PTT 87 sec . No bleeding noted.   Goal of Therapy:  Heparin level 0.3-0.7 units/ml PTT 66-102 sec Monitor platelets by anticoagulation protocol: Yes   Plan:  Continue heparin infusion at 1400 units/hr Will f/u daily PTT and heparin level Continue to monitor H&H and platelets  Isac Sarna, BS [place], BCPS Clinical Pharmacist Pager [phone] 05/14/2020 9:18 AM

## 2020-05-14 NOTE — Progress Notes (Signed)
ANTICOAGULATION CONSULT NOTE  Pharmacy Consult for: heparin dosing  Indication:  PE and afib  No Known Allergies  Patient Measurements: Height: 5\' 8"  (172.7 cm) Weight: 98.7 kg (217 lb 9.5 oz) IBW/kg (Calculated) : 68.4 Heparin Dosing Weight: 91 kg  Vital Signs: Temp: 98.2 F (36.8 C) (08/20 2000) Temp Source: Oral (08/20 2000) BP: 159/61 (08/20 2200) Pulse Rate: 65 (08/20 2200)  Labs: Recent Labs    05/11/20 0439 05/11/20 0439 05/12/20 0422 05/13/20 0500 05/13/20 0553 05/13/20 0938 05/13/20 1132 05/13/20 2220  HGB 8.5*   < > 9.0*  --  9.0*  --   --   --   HCT 27.5*  --  29.5*  --  28.6*  --   --   --   PLT 231  --  255  --  222  --   --   --   APTT  --   --   --   --   --   --   --  102*  HEPARINUNFRC 0.56  --  0.77*  --   --   --   --  >2.20*  CREATININE 2.27*  --  2.19* 1.77*  --   --   --   --   TROPONINIHS  --   --   --   --   --  99* 123*  --    < > = values in this interval not displayed.    Estimated Creatinine Clearance: 41.1 mL/min (A) (by C-G formula based on SCr of 1.77 mg/dL (H)).   Assessment: Pharmacy  consulted to re-start heparin infusion 8/20 for this 74 yo male for acute PE per CTA of chest. Patient was on heparin earlier this week for atrial fibrillation, but it was discontinued on 8-12 due to ecchymosis of patient's groin region.  Heparin level elevated (>2.2) due to apixaban so using PTT for monitoring until levels correlate. PTT 102 sec (high end of therapeutic range). No bleeding noted.   Goal of Therapy:  Heparin level 0.3-0.7 units/ml PTT 66-102 sec Monitor platelets by anticoagulation protocol: Yes   Plan:  Decrease heparin infusion slightly to 1400 units/hr to keep in therapeutic range Will f/u daily PTT and heparin level  Sherlon Handing, PharmD, BCPS Please see amion for complete clinical pharmacist phone list 05/14/2020 12:17 AM

## 2020-05-15 LAB — CBC
HCT: 29.6 % — ABNORMAL LOW (ref 39.0–52.0)
Hemoglobin: 9.2 g/dL — ABNORMAL LOW (ref 13.0–17.0)
MCH: 29.3 pg (ref 26.0–34.0)
MCHC: 31.1 g/dL (ref 30.0–36.0)
MCV: 94.3 fL (ref 80.0–100.0)
Platelets: 234 10*3/uL (ref 150–400)
RBC: 3.14 MIL/uL — ABNORMAL LOW (ref 4.22–5.81)
RDW: 15.9 % — ABNORMAL HIGH (ref 11.5–15.5)
WBC: 8 10*3/uL (ref 4.0–10.5)
nRBC: 0 % (ref 0.0–0.2)

## 2020-05-15 LAB — BASIC METABOLIC PANEL
Anion gap: 12 (ref 5–15)
BUN: 31 mg/dL — ABNORMAL HIGH (ref 8–23)
CO2: 25 mmol/L (ref 22–32)
Calcium: 9.2 mg/dL (ref 8.9–10.3)
Chloride: 102 mmol/L (ref 98–111)
Creatinine, Ser: 1.85 mg/dL — ABNORMAL HIGH (ref 0.61–1.24)
GFR calc Af Amer: 40 mL/min — ABNORMAL LOW (ref >60)
GFR calc non Af Amer: 35 mL/min — ABNORMAL LOW (ref >60)
Glucose, Bld: 131 mg/dL — ABNORMAL HIGH (ref 70–99)
Potassium: 3.5 mmol/L (ref 3.5–5.1)
Sodium: 139 mmol/L (ref 135–145)

## 2020-05-15 LAB — GLUCOSE, CAPILLARY
Glucose-Capillary: 122 mg/dL — ABNORMAL HIGH (ref 70–99)
Glucose-Capillary: 133 mg/dL — ABNORMAL HIGH (ref 70–99)
Glucose-Capillary: 181 mg/dL — ABNORMAL HIGH (ref 70–99)
Glucose-Capillary: 140 mg/dL — ABNORMAL HIGH (ref 70–99)
Glucose-Capillary: 137 mg/dL — ABNORMAL HIGH (ref 70–99)

## 2020-05-15 LAB — HEPARIN LEVEL (UNFRACTIONATED): Heparin Unfractionated: 1.52 IU/mL — ABNORMAL HIGH (ref 0.30–0.70)

## 2020-05-15 LAB — APTT: aPTT: 93 seconds — ABNORMAL HIGH (ref 24–36)

## 2020-05-15 MED ORDER — APIXABAN 5 MG PO TABS
5.0000 mg | ORAL_TABLET | Freq: Two times a day (BID) | ORAL | Status: DC
  Administered 2020-05-15 – 2020-05-17 (×4): 5 mg via ORAL
  Filled 2020-05-15 (×4): qty 1

## 2020-05-15 MED ORDER — APIXABAN 2.5 MG PO TABS: 2.5000 mg | ORAL_TABLET | Freq: Two times a day (BID) | ORAL | Status: DC

## 2020-05-15 NOTE — Progress Notes (Addendum)
ANTICOAGULATION CONSULT NOTE  Pharmacy Consult for: heparin dosing to--> eliquis Indication:  PE and afib  No Known Allergies  Patient Measurements: Height: 5\' 8"  (172.7 cm) Weight: 96.8 kg (213 lb 6.5 oz) IBW/kg (Calculated) : 68.4 Heparin Dosing Weight: 91 kg  Vital Signs: Temp: 98.3 F (36.8 C) (08/22 0900) Temp Source: Oral (08/22 0900) BP: 130/46 (08/22 1400) Pulse Rate: 73 (08/22 1400)  Labs: Recent Labs    05/13/20 0500 05/13/20 0553 05/13/20 0553 05/13/20 0938 05/13/20 1132 05/13/20 2220 05/14/20 0828 05/15/20 0451  HGB  --  9.0*   < >  --   --   --  9.3* 9.2*  HCT  --  28.6*  --   --   --   --  30.2* 29.6*  PLT  --  222  --   --   --   --  233 234  APTT  --   --   --   --   --  102* 87* 93*  HEPARINUNFRC  --   --   --   --   --  >2.20*  --  1.52*  CREATININE 1.77*  --   --   --   --   --  1.95* 1.85*  TROPONINIHS  --   --   --  99* 123*  --   --   --    < > = values in this interval not displayed.    Estimated Creatinine Clearance: 38.9 mL/min (A) (by C-G formula based on SCr of 1.85 mg/dL (H)).   Assessment: Pharmacy  consulted to re-start heparin infusion 8/20 for this 74 yo male for acute PE per CTA of chest. Patient was on heparin earlier this week for atrial fibrillation, but it was discontinued on 8-12 due to ecchymosis of patient's groin region. Now, plan to transition to eliquis. Has had heparin for 7 days and MD wants to transition to 5mg  po BID. 74yo, Scr 1.51, 96.8kg   Goal of Therapy:  Monitor platelets by anticoagulation protocol: Yes   Plan:  D/C heparin Eliquis 5mg  po BID Educate on eliquis Monitor for S/S of bleeding  Isac Sarna, BS [place], BCPS Clinical Pharmacist Pager [phone] 05/15/2020 5:27 PM

## 2020-05-15 NOTE — Progress Notes (Signed)
Bleeding from urethra noted after morning urination. Small amount. Pericare provided. MD made aware.

## 2020-05-15 NOTE — Progress Notes (Signed)
ANTICOAGULATION CONSULT NOTE  Pharmacy Consult for: heparin dosing  Indication:  PE and afib  No Known Allergies  Patient Measurements: Height: 5\' 8"  (172.7 cm) Weight: 96.8 kg (213 lb 6.5 oz) IBW/kg (Calculated) : 68.4 Heparin Dosing Weight: 91 kg  Vital Signs: Temp: 98.3 F (36.8 C) (08/22 0400) Temp Source: Oral (08/22 0400) BP: 131/63 (08/22 0600) Pulse Rate: 68 (08/22 0600)  Labs: Recent Labs    05/13/20 0500 05/13/20 0553 05/13/20 0553 05/13/20 0938 05/13/20 1132 05/13/20 2220 05/14/20 0828 05/15/20 0451  HGB  --  9.0*   < >  --   --   --  9.3* 9.2*  HCT  --  28.6*  --   --   --   --  30.2* 29.6*  PLT  --  222  --   --   --   --  233 234  APTT  --   --   --   --   --  102* 87* 93*  HEPARINUNFRC  --   --   --   --   --  >2.20*  --  1.52*  CREATININE 1.77*  --   --   --   --   --  1.95* 1.85*  TROPONINIHS  --   --   --  99* 123*  --   --   --    < > = values in this interval not displayed.    Estimated Creatinine Clearance: 38.9 mL/min (A) (by C-G formula based on SCr of 1.85 mg/dL (H)).   Assessment: Pharmacy  consulted to re-start heparin infusion 8/20 for this 74 yo male for acute PE per CTA of chest. Patient was on heparin earlier this week for atrial fibrillation, but it was discontinued on 8-12 due to ecchymosis of patient's groin region.  Heparin level elevated (1.4) due to apixaban so using PTT for monitoring until levels correlate.  PTT 93 sec . Hgb stable. No bleeding noted.   Goal of Therapy:  Heparin level 0.3-0.7 units/ml PTT 66-102 sec Monitor platelets by anticoagulation protocol: Yes   Plan:  Continue heparin infusion at 1400 units/hr Will f/u daily PTT and heparin level Continue to monitor H&H and platelets  Isac Sarna, BS [place], BCPS Clinical Pharmacist Pager [phone] 05/15/2020 9:03 AM

## 2020-05-15 NOTE — Progress Notes (Signed)
PROGRESS NOTE    [name]  BHA:[id number] DOB: 1946-03-08 DOA: 05/01/2020 PCP: Claretta Fraise, MD   Brief Narrative:  XBD:ZHGDJM C Estesis a 74 y.o.malewith medical history significant forrecent iliac stents, diabetes mellitus, hypertension. Patient presented to the ED today with complaints of losing vision in his left eye,started yesterday.Patient woke up yesterday-Saturday morning with this, and this persisted today. Reports it feels like a dark shadow on the peripheral side ofhis left eye. He also reports some tingling in his left arm-this has resolved. No strokes. He denies weakness of his extremities. No change in speech, no facial asymmetry. Patient took his aspirin and Plavix and statins today. Patient had bilateral iliac stents,2 on the right, one on the left placed about 5 days ago at [facility].  The only cardiac history patient is aware of is a cardiac murmur. He is not aware of history of irregular heart rhythm.  ED Course:Blood pressure systolic 1 1 5-1 2 7, heart rate 49-59,hemoglobin 8.6,recent baseline about 11.Creatinine 1.7, about baseline. Head CT shows acute right occipital infarct. EKG showing atrial fibrillation. Telemetry neurology consulted-patient outside TPA window, stroke work-up.Dualantiplatelet for now. Hospitalist to admit for acute CVA.   Assessment & Plan:   Principal Problem:   Acute CVA (cerebrovascular accident) [facility]) Active Problems:   Peripheral vascular disease ([place])   Gout   Essential hypertension   Type 2 diabetes mellitus without complication, without long-term current use of insulin (HCC)   Pure hypercholesterolemia   S/P insertion of iliac artery stent   PAF (paroxysmal atrial fibrillation) (HCC)   Elevated troponin   Fever   Positive D dimer   Acute respiratory failure with hypoxia (HCC)   Endotracheally intubated   Non-ST elevation (NSTEMI) myocardial infarction ([place])   Acute systolic  heart failure (HCC)   Acute hypoxemic respiratory failure status post intubation 8/10-status post extubation -Likely multifactorial with HCAP as well as PE now noted on CT exam 8/15 -DuoNeb for potential component of air trapping with probable COPD -Cefepime discontinued after 8 days of treatment with noted left lower lobe pneumonia -Appreciate ongoing pulmonology recommendations  -Patient was initially doing well and was weaned off oxygen, but then on 8/20 developed acute decompensation and had to be put back on bipap -chest xray shows CHF pattern -EKG with sinus tachycardia -cycle troponins = no significant rise -He received Lasix with good urine output and improvement of respiratory status -Currently on nasal cannula oxygen, but respirations still appear to be labored -Continue on Lasix and wean off oxygen as tolerated  Acute systolic CHF -EF of 42% on recent echo -Overall respirations improved after receiving IV Lasix -With mild bump in creatinine, Lasix changed to oral route -continue beta blocker, imdur and hydralazine  PE -Noted on CT chest 8/15 -Currently on IV heparin and hemoglobin has been stable -Plan on transitioning to Eliquis  Left lower lobe pneumonia -Cefepime discontinued after 8 total days of treatment -Continue to monitor  NSTEMI -Continue on Plavix and aspirin  -Continue atorvastatin -Beta-blocker to resume once blood pressure stabilizes -Limited 2D echocardiogramwith LVEF 35% and apical akinesis. Not a candidate for emergent catheterization at this time. May reflect stress-induced cardiomyopathy versus MI. -Troponin downward trending with peak of 2500 -Discussed with cardiology  with plans for cardiac catheterization in the outpatient setting  Acute right occipital CVA with vision loss to left eye -LDL 54 -A1c 6.2% -He was started on Eliquis with recommendation to continue Plavix by his vascular surgeon Dr. Rosana Hoes at [facility]  [place] will  need 30-day Holter monitoring in outpatient setting -Appreciate neurology recommendations  AKI on CKD stage III -Likely cardiorenal with some volume overload noted from recent PRBC transfusion -Baseline creatinine 1.2-1.3 -Creatinine peak of 2.2 likely related to recent diuresis, IV contrast.  Currently creatinine 1.8 -Monitor repeat labs in a.m. -Hold ARB  Probable paroxysmal atrial fibrillation -Likely to require outpatient 30-day monitor for further evaluation given acute CVA -Elevated heart rates noted overnight on 8/12. Started on Lopressor 25 mg 3 times daily by cardiology, this has been increased to 37.5 mg 3 times daily  -IV metoprolol on board for heart rate elevationsas needed -anticoagulation with heparin.  We will plan on transitioning to Eliquis today  Anemia-chronic iron deficiency anemia noted-downtrending -Improved after 1 unit PRBC transfusion earlier, repeat PRBC transfusion on 8/16 -Plan to follow CBC -No overt bleeding currently noted -Restart heparin 8/17  -Hemoglobin stable   Hematuria-resolved -Heparin drip discontinued 8/13 -Could be related to traumatic Foley insertion -No significant findings on renal ultrasound  Diabetes, non-insulin-dependent with nephropathy -Holding home oral Metformin and pioglitazone -Continue SSI with stable blood glucose readings noted  History of COPD-Long history of tobacco abuse -We will need outpatient pulmonology follow-up for PFTs -Continue on duo nebs every 6 hours  PVD -Status post mesenteric artery bypass with recent bilateral iliac stenting at [facility] 04/27/2020 -Plan to continue on Eliquis and Plavix outpatient and hold aspirin, per discussion with vascular surgeon at [facility] and once stable -Currently with bruising over the right groin region with pressure dressing applied. Anticoagulation held. -Pressure dressing removed 8/14 and bruising appears to be  improving.  Hypertension-stable -Patient on metoprolol per cardiology  DVT prophylaxis:heparin infusion Code Status:DNR Family Communication:updated son over the phone 8/20 Disposition Plan: Status is: Inpatient  Remains inpatient appropriate because:Ongoing diagnostic testing needed not appropriate for outpatient work up, IV treatments appropriate due to intensity of illness or inability to take PO and Inpatient level of care appropriate due to severity of illness   Dispo: The patient is from:Home Anticipated d/c is SV:XBLT with home health physical therapy Anticipated d/c date is:1-2 days Patient currently is not medically stable to d/c.   Patient needs continued management for decompensated CHF and respiratory failure.  Consultants:  Cardiology  Neurology  Pulmonology  Procedures:  See below  Antimicrobials:  Anti-infectives (From admission, onward)   Start     Dose/Rate Route Frequency Ordered Stop   05/04/20 2200  vancomycin (VANCOREADY) IVPB 1250 mg/250 mL  Status:  Discontinued        1,250 mg 166.7 mL/hr over 90 Minutes Intravenous Every 24 hours 05/03/20 2335 05/04/20 0118   05/04/20 1400  ceFEPIme (MAXIPIME) 2 g in sodium chloride 0.9 % 100 mL IVPB  Status:  Discontinued        2 g 200 mL/hr over 30 Minutes Intravenous Every 12 hours 05/04/20 1348 05/10/20 1017   05/04/20 1000  ceFEPIme (MAXIPIME) 2 g in sodium chloride 0.9 % 100 mL IVPB  Status:  Discontinued        2 g 200 mL/hr over 30 Minutes Intravenous Every 12 hours 05/03/20 2335 05/04/20 0942   05/03/20 2315  vancomycin (VANCOREADY) IVPB 2000 mg/400 mL        2,000 mg 200 mL/hr over 120 Minutes Intravenous  Once 05/03/20 2303 05/04/20 0153   05/03/20 2315  ceFEPIme (MAXIPIME) 2 g in sodium chloride 0.9 % 100 mL IVPB  Status:  Discontinued        2  g 200 mL/hr over 30 Minutes Intravenous  Once 05/03/20 2303 05/04/20 0118       Subjective: Continues to have cough.  Overall feels her shortness of breath is better than it was yesterday.  No chest pain.  Objective: Vitals:   05/15/20 1400 05/15/20 1500 05/15/20 1600 05/15/20 1700  BP: (!) 130/46 (!) 152/63 135/80 (!) 155/64  Pulse: 73 72 88 75  Resp: 13 (!) 24 (!) 25 19  Temp:      TempSrc:      SpO2: 95% 92% 93% 94%  Weight:      Height:        Intake/Output Summary (Last 24 hours) at 05/15/2020 1844 Last data filed at 05/15/2020 1717 Gross per 24 hour  Intake 1124.99 ml  Output 1750 ml  Net -625.01 ml   Filed Weights   05/12/20 2105 05/14/20 0331 05/15/20 0500  Weight: 98.7 kg 98 kg 96.8 kg    Examination: General exam: Alert, awake, oriented x 3 Respiratory system: Mild crackles at bases.  Increased respiratory effort. Cardiovascular system: Irregular. No murmurs, rubs, gallops. Gastrointestinal system: Abdomen is nondistended, soft and nontender. No organomegaly or masses felt. Normal bowel sounds heard. Central nervous system: Alert and oriented. No focal neurological deficits. Extremities: No C/C/E, +pedal pulses Skin: No rashes, lesions or ulcers Psychiatry: Judgement and insight appear normal. Mood & affect appropriate.     Data Reviewed: I have personally reviewed following labs and imaging studies  CBC: Recent Labs  Lab 05/11/20 0439 05/12/20 0422 05/13/20 0553 05/14/20 0828 05/15/20 0451  WBC 6.3 6.6 5.4 6.0 8.0  HGB 8.5* 9.0* 9.0* 9.3* 9.2*  HCT 27.5* 29.5* 28.6* 30.2* 29.6*  MCV 93.2 93.9 94.4 95.0 94.3  PLT 231 255 222 233 071   Basic Metabolic Panel: Recent Labs  Lab 05/09/20 0147 05/09/20 0147 05/10/20 0445 05/10/20 0445 05/11/20 0439 05/12/20 0422 05/13/20 0500 05/14/20 0828 05/15/20 0451  NA 138   < > 138   < > 138 141 141 140 139  K 3.3*   < > 4.6   < > 3.0* 4.1 4.0 3.8 3.5  CL 105   < > 106   < > 105 107 109 106 102  CO2 22   < > 22   < > _0 GLUCOSE 138*   < > 157*   < > 136* 136* 150*  140* 131*  BUN 27*   < > 33*   < > 38* 38* 30* 31* 31*  CREATININE 1.27*   < > 1.89*   < > 2.27* 2.19* 1.77* 1.95* 1.85*  CALCIUM 8.6*   < > 8.5*   < > 8.3* 9.0 8.9 8.9 9.2  MG 1.8  --  1.9  --  1.8  --   --   --   --   PHOS  --   --   --   --   --  3.2 3.3  --   --    < > = values in this interval not displayed.   GFR: Estimated Creatinine Clearance: 38.9 mL/min (A) (by C-G formula based on SCr of 1.85 mg/dL (H)). Liver Function Tests: Recent Labs  Lab 05/12/20 0422 05/13/20 0500  ALBUMIN 2.8* 2.9*   No results for input(s): LIPASE, AMYLASE in the last 168 hours. No results for input(s): AMMONIA in the last 168 hours. Coagulation Profile: No results for input(s): INR, PROTIME in the last 168 hours. Cardiac Enzymes: No results for  input(s): CKTOTAL, CKMB, CKMBINDEX, TROPONINI in the last 168 hours. BNP (last 3 results) No results for input(s): PROBNP in the last 8760 hours. HbA1C: No results for input(s): HGBA1C in the last 72 hours. CBG: Recent Labs  Lab 05/14/20 2353 05/15/20 0453 05/15/20 0752 05/15/20 1104 05/15/20 1624  GLUCAP 113* 122* 133* 181* 140*   Lipid Profile: Recent Labs    05/12/20 2041  TRIG 109   Thyroid Function Tests: No results for input(s): TSH, T4TOTAL, FREET4, T3FREE, THYROIDAB in the last 72 hours. Anemia Panel: No results for input(s): VITAMINB12, FOLATE, FERRITIN, TIBC, IRON, RETICCTPCT in the last 72 hours. Sepsis Labs: No results for input(s): PROCALCITON, LATICACIDVEN in the last 168 hours.  Recent Results (from the past 240 hour(s))  Culture, blood (routine x 2)     Status: None   Collection Time: 05/05/20  8:10 PM   Specimen: BLOOD LEFT ARM  Result Value Ref Range Status   Specimen Description BLOOD LEFT ARM  Final   Special Requests   Final    BOTTLES DRAWN AEROBIC AND ANAEROBIC Blood Culture adequate volume   Culture   Final    NO GROWTH 6 DAYS Performed at [facility], [address], [place], [place] [address]    Report  Status 05/11/2020 FINAL  Final         Radiology Studies: No results found.      Scheduled Meds: . sodium chloride   Intravenous Once  . albuterol  2.5 mg Nebulization Once  . allopurinol  100 mg Oral BID  . apixaban  5 mg Oral BID  . aspirin EC  81 mg Oral Daily  . atorvastatin  80 mg Oral Daily  . carvedilol  6.25 mg Oral BID WC  . chlorhexidine gluconate (MEDLINE KIT)  15 mL Mouth Rinse BID  . Chlorhexidine Gluconate Cloth  6 each Topical Daily  . clopidogrel  75 mg Oral Daily  . feeding supplement (ENSURE ENLIVE)  237 mL Oral BID BM  . furosemide  40 mg Oral Daily  . hydrALAZINE  10 mg Oral BID  . insulin aspart  0-5 Units Subcutaneous QHS  . insulin aspart  0-9 Units Subcutaneous TID WC  . isosorbide mononitrate  15 mg Oral Daily  . pantoprazole  40 mg Oral Daily  . polyethylene glycol  8.5 g Oral Daily  . sodium chloride flush  10-40 mL Intracatheter Q12H   Continuous Infusions:    LOS: 14 days   Time spent :30 mins   Kathie Dike, MD [facility]  If 7PM-7AM, please contact night-coverage [email or web address] 05/15/2020, 6:44 PM

## 2020-05-15 NOTE — Progress Notes (Signed)
Bipap is PRN order.  Not needed at this time.  Patient on RA with sat of 96%.

## 2020-05-16 DIAGNOSIS — R778 Other specified abnormalities of plasma proteins: Secondary | ICD-10-CM

## 2020-05-16 LAB — CBC
HCT: 27.3 % — ABNORMAL LOW (ref 39.0–52.0)
Hemoglobin: 8.4 g/dL — ABNORMAL LOW (ref 13.0–17.0)
MCH: 28.8 pg (ref 26.0–34.0)
MCHC: 30.8 g/dL (ref 30.0–36.0)
MCV: 93.5 fL (ref 80.0–100.0)
Platelets: 210 10*3/uL (ref 150–400)
RBC: 2.92 MIL/uL — ABNORMAL LOW (ref 4.22–5.81)
RDW: 16 % — ABNORMAL HIGH (ref 11.5–15.5)
WBC: 6.7 10*3/uL (ref 4.0–10.5)
nRBC: 0 % (ref 0.0–0.2)

## 2020-05-16 LAB — GLUCOSE, CAPILLARY
Glucose-Capillary: 117 mg/dL — ABNORMAL HIGH (ref 70–99)
Glucose-Capillary: 117 mg/dL — ABNORMAL HIGH (ref 70–99)
Glucose-Capillary: 120 mg/dL — ABNORMAL HIGH (ref 70–99)
Glucose-Capillary: 127 mg/dL — ABNORMAL HIGH (ref 70–99)
Glucose-Capillary: 164 mg/dL — ABNORMAL HIGH (ref 70–99)
Glucose-Capillary: 185 mg/dL — ABNORMAL HIGH (ref 70–99)

## 2020-05-16 LAB — BASIC METABOLIC PANEL
Anion gap: 10 (ref 5–15)
BUN: 28 mg/dL — ABNORMAL HIGH (ref 8–23)
CO2: 25 mmol/L (ref 22–32)
Calcium: 8.7 mg/dL — ABNORMAL LOW (ref 8.9–10.3)
Chloride: 104 mmol/L (ref 98–111)
Creatinine, Ser: 1.7 mg/dL — ABNORMAL HIGH (ref 0.61–1.24)
GFR calc Af Amer: 45 mL/min — ABNORMAL LOW (ref >60)
GFR calc non Af Amer: 39 mL/min — ABNORMAL LOW (ref >60)
Glucose, Bld: 122 mg/dL — ABNORMAL HIGH (ref 70–99)
Potassium: 3.5 mmol/L (ref 3.5–5.1)
Sodium: 139 mmol/L (ref 135–145)

## 2020-05-16 LAB — TRIGLYCERIDES: Triglycerides: 71 mg/dL (ref <150)

## 2020-05-16 NOTE — Progress Notes (Signed)
PROGRESS NOTE    Connor Bryant  VEH:[id number] DOB: 08-04-1946 DOA: 05/01/2020 PCP: Claretta Fraise, MD   Brief Narrative:  EZM:OQHUTM C Estesis a 74 y.o.malewith medical history significant forrecent iliac stents, diabetes mellitus, hypertension. Patient presented to the ED today with complaints of losing vision in his left eye,started yesterday.Patient woke up yesterday-Saturday morning with this, and this persisted today. Reports it feels like a dark shadow on the peripheral side ofhis left eye. He also reports some tingling in his left arm-this has resolved. No strokes. He denies weakness of his extremities. No change in speech, no facial asymmetry. Patient took his aspirin and Plavix and statins today. Patient had bilateral iliac stents,2 on the right, one on the left placed about 5 days ago at [facility].  The only cardiac history patient is aware of is a cardiac murmur. He is not aware of history of irregular heart rhythm.  ED Course:Blood pressure systolic 1 1 5-1 2 7, heart rate 49-59,hemoglobin 8.6,recent baseline about 11.Creatinine 1.7, about baseline. Head CT shows acute right occipital infarct. EKG showing atrial fibrillation. Telemetry neurology consulted-patient outside TPA window, stroke work-up.Dualantiplatelet for now. Hospitalist to admit for acute CVA.   Assessment & Plan:   Principal Problem:   Acute CVA (cerebrovascular accident) [facility]) Active Problems:   Peripheral vascular disease ([place])   Gout   Essential hypertension   Type 2 diabetes mellitus without complication, without long-term current use of insulin (HCC)   Pure hypercholesterolemia   S/P insertion of iliac artery stent   PAF (paroxysmal atrial fibrillation) (HCC)   Elevated troponin   Fever   Positive D dimer   Acute respiratory failure with hypoxia (HCC)   Endotracheally intubated   Non-ST elevation (NSTEMI) myocardial infarction ([place])   Acute systolic  heart failure (HCC)   Acute hypoxemic respiratory failure status post intubation 8/10-status post extubation -Likely multifactorial with HCAP as well as PE now noted on CT exam 8/15 -DuoNeb for potential component of air trapping with probable COPD -Cefepime discontinued after 8 days of treatment with noted left lower lobe pneumonia -Appreciate ongoing pulmonology recommendations  -Patient was initially doing well and was weaned off oxygen, but then on 8/20 developed acute decompensation and had to be put back on bipap -chest xray shows CHF pattern -EKG with sinus tachycardia -cycle troponins = no significant rise -He received Lasix with good urine output and improvement of respiratory status -He has been weaned off oxygen  Acute systolic CHF -EF of 54% on recent echo -Overall respirations improved after receiving IV Lasix -With mild bump in creatinine, Lasix changed to oral route -continue beta blocker, imdur and hydralazine  PE -Noted on CT chest 8/15 -Initially on IV heparin and hemoglobin has been stable -Currently on Eliquis  Left lower lobe pneumonia -Cefepime discontinued after 8 total days of treatment -Continue to monitor  NSTEMI -Continue on Plavix and aspirin  -Continue atorvastatin -Beta-blocker to resume once blood pressure stabilizes -Limited 2D echocardiogramwith LVEF 35% and apical akinesis. Not a candidate for emergent catheterization at this time. May reflect stress-induced cardiomyopathy versus MI. -Troponin downward trending with peak of 2500 -Discussed with cardiology  with plans for cardiac catheterization in the outpatient setting  Acute right occipital CVA with vision loss to left eye -LDL 54 -A1c 6.2% -He was started on Eliquis with recommendation to continue Plavix by his vascular surgeon Dr. Rosana Hoes at [place] will need 30-day Holter monitoring in outpatient setting -Appreciate neurology recommendations  AKI on CKD  stage  III -Likely cardiorenal with some volume overload noted from recent PRBC transfusion -Baseline creatinine 1.2-1.3 -Creatinine peak of 2.2 likely related to recent diuresis, IV contrast.  Currently creatinine 1.7 -Monitor repeat labs in a.m. -Hold ARB  Probable paroxysmal atrial fibrillation -Likely to require outpatient 30-day monitor for further evaluation given acute CVA -Elevated heart rates noted overnight on 8/12. Started on Lopressor 25 mg 3 times daily by cardiology, this has been increased to 37.5 mg 3 times daily  -IV metoprolol on board for heart rate elevationsas needed -anticoagulation with Eliquis  Anemia-chronic iron deficiency anemia noted-downtrending -Improved after 1 unit PRBC transfusion earlier, repeat PRBC transfusion on 8/16 -Plan to follow CBC -No overt bleeding currently noted -Restart heparin 8/17  -Hemoglobin trended down today. Continue to monitor   Hematuria-resolved -Heparin drip discontinued 8/13 -Could be related to traumatic Foley insertion -No significant findings on renal ultrasound  Diabetes, non-insulin-dependent with nephropathy -Holding home oral Metformin and pioglitazone -Continue SSI with stable blood glucose readings noted  History of COPD-Long history of tobacco abuse -We will need outpatient pulmonology follow-up for PFTs -Continue on duo nebs every 6 hours  PVD -Status post mesenteric artery bypass with recent bilateral iliac stenting at [facility] 04/27/2020 -Plan to continue on Eliquis and Plavix outpatient and hold aspirin, per discussion with vascular surgeon at [facility] and once stable -Currently with bruising over the right groin region with pressure dressing applied. Anticoagulation held. -Pressure dressing removed 8/14 and bruising appears to be improving.  Hypertension-stable -Patient on metoprolol per cardiology  DVT prophylaxis:eliquis Code Status:DNR Family Communication:updated son over the  phone 8/20 Disposition Plan: Status is: Inpatient  Remains inpatient appropriate because:Ongoing diagnostic testing needed not appropriate for outpatient work up, IV treatments appropriate due to intensity of illness or inability to take PO and Inpatient level of care appropriate due to severity of illness   Dispo: The patient is from:Home Anticipated d/c is HE:NIDP with home health physical therapy Anticipated d/c date is:1 day Patient currently is not medically stable to d/c.   Patient needs continued management for decompensated CHF and respiratory failure.  Consultants:  Cardiology  Neurology  Pulmonology  Procedures:  See below  Antimicrobials:  Anti-infectives (From admission, onward)   Start     Dose/Rate Route Frequency Ordered Stop   05/04/20 2200  vancomycin (VANCOREADY) IVPB 1250 mg/250 mL  Status:  Discontinued        1,250 mg 166.7 mL/hr over 90 Minutes Intravenous Every 24 hours 05/03/20 2335 05/04/20 0118   05/04/20 1400  ceFEPIme (MAXIPIME) 2 g in sodium chloride 0.9 % 100 mL IVPB  Status:  Discontinued        2 g 200 mL/hr over 30 Minutes Intravenous Every 12 hours 05/04/20 1348 05/10/20 1017   05/04/20 1000  ceFEPIme (MAXIPIME) 2 g in sodium chloride 0.9 % 100 mL IVPB  Status:  Discontinued        2 g 200 mL/hr over 30 Minutes Intravenous Every 12 hours 05/03/20 2335 05/04/20 0942   05/03/20 2315  vancomycin (VANCOREADY) IVPB 2000 mg/400 mL        2,000 mg 200 mL/hr over 120 Minutes Intravenous  Once 05/03/20 2303 05/04/20 0153   05/03/20 2315  ceFEPIme (MAXIPIME) 2 g in sodium chloride 0.9 % 100 mL IVPB  Status:  Discontinued        2 g 200 mL/hr over 30 Minutes Intravenous  Once 05/03/20 2303 05/04/20 0118      Subjective: Feels better today.  Had a bowel movement today. Feels that breathing is improving. Urine still has some blood, but improving  Objective: Vitals:   05/16/20 1500 05/16/20 1600  05/16/20 1623 05/16/20 1700  BP: (!) 162/50 (!) 154/114  (!) 167/51  Pulse: 70 73 78 69  Resp: 13 16 18  (!) 22  Temp:   98.1 F (36.7 C)   TempSrc:   Oral   SpO2: 95% 93% 92% 94%  Weight:      Height:        Intake/Output Summary (Last 24 hours) at 05/16/2020 1714 Last data filed at 05/16/2020 0800 Gross per 24 hour  Intake 45.97 ml  Output 800 ml  Net -754.03 ml   Filed Weights   05/14/20 0331 05/15/20 0500 05/16/20 0500  Weight: 98 kg 96.8 kg 96.3 kg    Examination: General exam: Alert, awake, oriented x 3 Respiratory system: Clear to auscultation. Respiratory effort normal. Cardiovascular system:RRR. No murmurs, rubs, gallops. Gastrointestinal system: Abdomen is nondistended, soft and nontender. No organomegaly or masses felt. Normal bowel sounds heard. Central nervous system: Alert and oriented. No focal neurological deficits. Extremities: No C/C/E, +pedal pulses Skin: No rashes, lesions or ulcers Psychiatry: Judgement and insight appear normal. Mood & affect appropriate.      Data Reviewed: I have personally reviewed following labs and imaging studies  CBC: Recent Labs  Lab 05/12/20 0422 05/13/20 0553 05/14/20 0828 05/15/20 0451 05/16/20 0420  WBC 6.6 5.4 6.0 8.0 6.7  HGB 9.0* 9.0* 9.3* 9.2* 8.4*  HCT 29.5* 28.6* 30.2* 29.6* 27.3*  MCV 93.9 94.4 95.0 94.3 93.5  PLT 255 222 233 234 742   Basic Metabolic Panel: Recent Labs  Lab 05/10/20 0445 05/10/20 0445 05/11/20 0439 05/11/20 0439 05/12/20 0422 05/13/20 0500 05/14/20 0828 05/15/20 0451 05/16/20 0420  NA 138   < > 138   < > 141 141 140 139 139  K 4.6   < > 3.0*   < > 4.1 4.0 3.8 3.5 3.5  CL 106   < > 105   < > 107 109 106 102 104  CO2 22   < > 23   < > 22 22 24 25 25   GLUCOSE 157*   < > 136*   < > 136* 150* 140* 131* 122*  BUN 33*   < > 38*   < > 38* 30* 31* 31* 28*  CREATININE 1.89*   < > 2.27*   < > 2.19* 1.77* 1.95* 1.85* 1.70*  CALCIUM 8.5*   < > 8.3*   < > 9.0 8.9 8.9 9.2 8.7*  MG 1.9   --  1.8  --   --   --   --   --   --   PHOS  --   --   --   --  3.2 3.3  --   --   --    < > = values in this interval not displayed.   GFR: Estimated Creatinine Clearance: 42.3 mL/min (A) (by C-G formula based on SCr of 1.7 mg/dL (H)). Liver Function Tests: Recent Labs  Lab 05/12/20 0422 05/13/20 0500  ALBUMIN 2.8* 2.9*   No results for input(s): LIPASE, AMYLASE in the last 168 hours. No results for input(s): AMMONIA in the last 168 hours. Coagulation Profile: No results for input(s): INR, PROTIME in the last 168 hours. Cardiac Enzymes: No results for input(s): CKTOTAL, CKMB, CKMBINDEX, TROPONINI in the last 168 hours. BNP (last 3 results) No results for input(s): PROBNP in the  last 8760 hours. HbA1C: No results for input(s): HGBA1C in the last 72 hours. CBG: Recent Labs  Lab 05/16/20 0002 05/16/20 0407 05/16/20 0742 05/16/20 1113 05/16/20 1622  GLUCAP 127* 120* 117* 164* 117*   Lipid Profile: Recent Labs    05/16/20 0420  TRIG 71   Thyroid Function Tests: No results for input(s): TSH, T4TOTAL, FREET4, T3FREE, THYROIDAB in the last 72 hours. Anemia Panel: No results for input(s): VITAMINB12, FOLATE, FERRITIN, TIBC, IRON, RETICCTPCT in the last 72 hours. Sepsis Labs: No results for input(s): PROCALCITON, LATICACIDVEN in the last 168 hours.  No results found for this or any previous visit (from the past 240 hour(s)).       Radiology Studies: No results found.      Scheduled Meds: . sodium chloride   Intravenous Once  . albuterol  2.5 mg Nebulization Once  . allopurinol  100 mg Oral BID  . apixaban  5 mg Oral BID  . atorvastatin  80 mg Oral Daily  . carvedilol  6.25 mg Oral BID WC  . chlorhexidine gluconate (MEDLINE KIT)  15 mL Mouth Rinse BID  . Chlorhexidine Gluconate Cloth  6 each Topical Daily  . clopidogrel  75 mg Oral Daily  . feeding supplement (ENSURE ENLIVE)  237 mL Oral BID BM  . furosemide  40 mg Oral Daily  . hydrALAZINE  10 mg Oral BID   . insulin aspart  0-5 Units Subcutaneous QHS  . insulin aspart  0-9 Units Subcutaneous TID WC  . isosorbide mononitrate  15 mg Oral Daily  . pantoprazole  40 mg Oral Daily  . polyethylene glycol  8.5 g Oral Daily  . sodium chloride flush  10-40 mL Intracatheter Q12H   Continuous Infusions:    LOS: 15 days   Time spent :30 mins   Kathie Dike, MD [facility]  If 7PM-7AM, please contact night-coverage [email or web address] 05/16/2020, 5:14 PM

## 2020-05-16 NOTE — Care Management Important Message (Signed)
Important Message  Patient Details  Name: Connor Bryant MRN: [id number] Date of Birth: [birth year]   Medicare Important Message Given:  Yes     Tommy Medal 05/16/2020, 4:18 PM

## 2020-05-16 NOTE — Progress Notes (Signed)
Progress Note  Patient Name: Connor Bryant Date of Encounter: 05/16/2020  [facility] Cardiologist: Carlyle Dolly, MD   Subjective   No complaints  Inpatient Medications    Scheduled Meds: . sodium chloride   Intravenous Once  . albuterol  2.5 mg Nebulization Once  . allopurinol  100 mg Oral BID  . apixaban  5 mg Oral BID  . aspirin EC  81 mg Oral Daily  . atorvastatin  80 mg Oral Daily  . carvedilol  6.25 mg Oral BID WC  . chlorhexidine gluconate (MEDLINE KIT)  15 mL Mouth Rinse BID  . Chlorhexidine Gluconate Cloth  6 each Topical Daily  . clopidogrel  75 mg Oral Daily  . feeding supplement (ENSURE ENLIVE)  237 mL Oral BID BM  . furosemide  40 mg Oral Daily  . hydrALAZINE  10 mg Oral BID  . insulin aspart  0-5 Units Subcutaneous QHS  . insulin aspart  0-9 Units Subcutaneous TID WC  . isosorbide mononitrate  15 mg Oral Daily  . pantoprazole  40 mg Oral Daily  . polyethylene glycol  8.5 g Oral Daily  . sodium chloride flush  10-40 mL Intracatheter Q12H   Continuous Infusions:  PRN Meds: acetaminophen **OR** acetaminophen, fentaNYL (SUBLIMAZE) injection, hydrALAZINE, ipratropium-albuterol, metoprolol tartrate, morphine injection, sodium chloride flush   Vital Signs    Vitals:   05/16/20 0500 05/16/20 0700 05/16/20 0743 05/16/20 0800  BP:  (!) 158/55  (!) 171/45  Pulse:  64 66 71  Resp:  17 15 16   Temp:   98.2 F (36.8 C)   TempSrc:   Oral   SpO2:  97% 99% 98%  Weight: 96.3 kg     Height:        Intake/Output Summary (Last 24 hours) at 05/16/2020 1027 Last data filed at 05/16/2020 0800 Gross per 24 hour  Intake 341.64 ml  Output 1150 ml  Net -808.36 ml   Last 3 Weights 05/16/2020 05/15/2020 05/14/2020  Weight (lbs) 212 lb 4.9 oz 213 lb 6.5 oz 216 lb 0.8 oz  Weight (kg) 96.3 kg 96.8 kg 98 kg      Telemetry    SR - Personally Reviewed  ECG    n/a- Personally Reviewed  Physical Exam   GEN: No acute distress.   Neck: No JVD Cardiac: RRR, 2/6  sysotlc murmur rusb, rubs, or gallops.  Respiratory: Clear to auscultation bilaterally. GI: Soft, nontender, non-distended  MS: No edema; No deformity. Neuro:  Nonfocal  Psych: Normal affect   Labs    High Sensitivity Troponin:   Recent Labs  Lab 05/04/20 1948 05/05/20 0319 05/05/20 0752 05/13/20 0938 05/13/20 1132  TROPONINIHS 2,499* 2,354* 540* 99* 123*      Chemistry Recent Labs  Lab 05/12/20 0422 05/12/20 0422 05/13/20 0500 05/13/20 0500 05/14/20 0828 05/15/20 0451 05/16/20 0420  NA 141   < > 141   < > 140 139 139  K 4.1   < > 4.0   < > 3.8 3.5 3.5  CL 107   < > 109   < > 106 102 104  CO2 22   < > 22   < > 24 25 25   GLUCOSE 136*   < > 150*   < > 140* 131* 122*  BUN 38*   < > 30*   < > 31* 31* 28*  CREATININE 2.19*   < > 1.77*   < > 1.95* 1.85* 1.70*  CALCIUM 9.0   < >  8.9   < > 8.9 9.2 8.7*  ALBUMIN 2.8*  --  2.9*  --   --   --   --   GFRNONAA 28*   < > 37*   < > 33* 35* 39*  GFRAA 33*   < > 43*   < > 38* 40* 45*  ANIONGAP 12   < > 10   < > 10 12 10    < > = values in this interval not displayed.     Hematology Recent Labs  Lab 05/14/20 0828 05/15/20 0451 05/16/20 0420  WBC 6.0 8.0 6.7  RBC 3.18* 3.14* 2.92*  HGB 9.3* 9.2* 8.4*  HCT 30.2* 29.6* 27.3*  MCV 95.0 94.3 93.5  MCH 29.2 29.3 28.8  MCHC 30.8 31.1 30.8  RDW 16.2* 15.9* 16.0*  PLT 233 234 210    BNPNo results for input(s): BNP, PROBNP in the last 168 hours.   DDimer No results for input(s): DDIMER in the last 168 hours.   Radiology    No results found.  Cardiac Studies    Patient Profile     Connor Bryant a 74 y.o.malewith past medical history of PVD (s/p prior mesenteric artery bypass and recent bilateral iliac stenting at [facility] on 04/27/2020),HTN, HLD,Type 2 DMand Stage 3 CKDwho is being seen today for the evaluation of anNSTEMIat the request ofDr. Manuella Ghazi.  Assessment & Plan    1. NSTEMI - initially admitted with stroke - during admission acute onset SOB,  hypoxia requiring intubation. Evidence of acute pulmonary edema.  - peak trop 2500 and trending down.EKG SR, lateral TWIs on admission and f/u, anteroseptal Qwaves - echo LVEF 35% (acute change from 05/02/20 echo) with apical akinesis.   - possible acute plaque rupture vs coronary embolic event given presented with new onset afib and stroke vs. Stress induced CM/Takotsubo CM given echo findings  - given recent stroke and respiratory failure on ventilator not candidate for emergent cath - subsequently admission complicated by PE, hematuria, anemia, AKI. Cath has not been pursued   - medical therapy currently with ASA, atorva 80, coreg 6.105m bid, plavix 75. No ACE/ARB in setting of renal dysfunction - with recurrent anemia would avoid triple therapy, stop ASA. [facility] primary team [facility] and eliquis (has been on DAPT for recent iliac stenting).   - would not pursue inpatient cath, could be reconsidered as outpatient pending course of other medical issues. Likely would repeat echo in roughly 6 weeks, if improving LVEF would not pursue cath, if persistent dysfunction weight potential given other medical issues.    2. Acute systolic HF - acute onset SOB during admission requring intubation, evidence of pulmonary edema - - echo LVEF 35% (acute change from 05/02/20 echo) with apical akinesis.  - possible acute plaque rupture vs coronary embolic event given presented with new onset afib and stroke vs. Stress induced CM/Takotsubo CM given echo findings - BNP1000--> 764, edema on CXR   - recurrent acute pulm edema 05/13/20, managed with bipap and diuresis - medical therapy with coreg, hydral/imdur. No ACE/ARB/ARNI given renal dysfunction - now on oral lasix 474mbid. Cr trending down. Net negative 40049mesterday. Sats high 90s room air     3. Afib,Paroxysmal - new diagnosis this admission, difficult to determine based on admit  EKG. Telemetry reviewed from ER showing episodes of afib. He definitely had clear cut afib later during admissoin on tele as well  -  had been on hep gtt, issues with hematuria.  - now on eliquis    4. CVA - per primary team -Acute right occipital CVA with vision loss to left eye  5. PE - currently on eliquis.   6. Anemia - trending down to 8.4 today - has had transfusions this admissoin.   7. PAD - s/p prior mesenteric artery bypass and recent bilateral iliac stenting at [facility] on 04/27/2020  8. HCAP - management per primary team  For questions or updates, please contact [facility] Please consult [email or web address] for contact info under        Signed, Carlyle Dolly, MD  05/16/2020, 10:27 AM

## 2020-05-16 NOTE — Progress Notes (Signed)
Bipap is PRN order.  No distress noted at this time.

## 2020-05-17 ENCOUNTER — Other Ambulatory Visit: Payer: Self-pay | Admitting: Internal Medicine

## 2020-05-17 DIAGNOSIS — R531 Weakness: Secondary | ICD-10-CM

## 2020-05-17 DIAGNOSIS — E1122 Type 2 diabetes mellitus with diabetic chronic kidney disease: Secondary | ICD-10-CM

## 2020-05-17 LAB — BASIC METABOLIC PANEL
Anion gap: 12 (ref 5–15)
BUN: 28 mg/dL — ABNORMAL HIGH (ref 8–23)
CO2: 26 mmol/L (ref 22–32)
Calcium: 8.8 mg/dL — ABNORMAL LOW (ref 8.9–10.3)
Chloride: 103 mmol/L (ref 98–111)
Creatinine, Ser: 1.78 mg/dL — ABNORMAL HIGH (ref 0.61–1.24)
GFR calc Af Amer: 42 mL/min — ABNORMAL LOW (ref >60)
GFR calc non Af Amer: 37 mL/min — ABNORMAL LOW (ref >60)
Glucose, Bld: 154 mg/dL — ABNORMAL HIGH (ref 70–99)
Potassium: 3.6 mmol/L (ref 3.5–5.1)
Sodium: 141 mmol/L (ref 135–145)

## 2020-05-17 LAB — CBC
HCT: 29.5 % — ABNORMAL LOW (ref 39.0–52.0)
Hemoglobin: 9.2 g/dL — ABNORMAL LOW (ref 13.0–17.0)
MCH: 29 pg (ref 26.0–34.0)
MCHC: 31.2 g/dL (ref 30.0–36.0)
MCV: 93.1 fL (ref 80.0–100.0)
Platelets: 216 10*3/uL (ref 150–400)
RBC: 3.17 MIL/uL — ABNORMAL LOW (ref 4.22–5.81)
RDW: 16.2 % — ABNORMAL HIGH (ref 11.5–15.5)
WBC: 5.9 10*3/uL (ref 4.0–10.5)
nRBC: 0 % (ref 0.0–0.2)

## 2020-05-17 LAB — GLUCOSE, CAPILLARY: Glucose-Capillary: 140 mg/dL — ABNORMAL HIGH (ref 70–99)

## 2020-05-17 MED ORDER — APIXABAN 5 MG PO TABS: 5.0000 mg | ORAL_TABLET | Freq: Two times a day (BID) | ORAL | 1 refills | Status: DC

## 2020-05-17 MED ORDER — CARVEDILOL 6.25 MG PO TABS
6.2500 mg | ORAL_TABLET | Freq: Two times a day (BID) | ORAL | 0 refills | Status: DC
Start: 2020-05-17 — End: 2020-05-31

## 2020-05-17 MED ORDER — ISOSORBIDE MONONITRATE ER 30 MG PO TB24
15.0000 mg | ORAL_TABLET | Freq: Every day | ORAL | 0 refills | Status: DC
Start: 2020-05-17 — End: 2020-06-14

## 2020-05-17 MED ORDER — FUROSEMIDE 40 MG PO TABS
40.0000 mg | ORAL_TABLET | Freq: Every day | ORAL | 1 refills | Status: DC
Start: 2020-05-17 — End: 2020-05-31

## 2020-05-17 MED ORDER — ATORVASTATIN CALCIUM 80 MG PO TABS: 80.0000 mg | ORAL_TABLET | Freq: Every day | ORAL | 1 refills | Status: DC

## 2020-05-17 MED ORDER — GLIMEPIRIDE 2 MG PO TABS: 2.0000 mg | ORAL_TABLET | ORAL | 11 refills | Status: DC

## 2020-05-17 NOTE — Discharge Summary (Signed)
Physician Discharge Summary  ACEL NATZKE PNT:[id number] DOB: [birth year] DOA: 05/01/2020  PCP: Claretta Fraise, MD  Admit date: 05/01/2020 Discharge date: 05/17/2020  Admitted From: Home Disposition: Home   Recommendations for Outpatient Follow-up:  1. Follow up with PCP in 1-2 weeks 2. Please obtain BMP/CBC in one week 3. Follow-up with cardiology as an outpatient 4. Follow-up with vascular surgeon as previously scheduled  Home Health: Home health PT, RN Equipment/Devices:  Discharge Condition: Stable CODE STATUS: DNR Diet recommendation: Heart healthy, carb modified  Brief/Interim Summary: GQQ:PYPPJK C Estesis a 74 y.o.malewith medical history significant forrecent iliac stents, diabetes mellitus, hypertension. Patient presented to the ED today with complaints of losing vision in his left eye,started yesterday.Patient woke up yesterday-Saturday morning with this, and this persisted today. Reports it feels like a dark shadow on the peripheral side ofhis left eye. He also reports some tingling in his left arm-this has resolved. No strokes. He denies weakness of his extremities. No change in speech, no facial asymmetry. Patient took his aspirin and Plavix and statins today. Patient had bilateral iliac stents,2 on the right, one on the left placed about 5 days ago at [facility].  The only cardiac history patient is aware of is a cardiac murmur. He is not aware of history of irregular heart rhythm.  ED Course:Blood pressure systolic 1 1 5-1 2 7, heart rate 49-59,hemoglobin 8.6,recent baseline about 11.Creatinine 1.7, about baseline. Head CT shows acute right occipital infarct. EKG showing atrial fibrillation. Telemetry neurology consulted-patient outside TPA window, stroke work-up.Dualantiplatelet for now. Hospitalist to admit for acute CVA.  Discharge Diagnoses:  Principal Problem:   Acute CVA (cerebrovascular accident) [facility]) Active  Problems:   Peripheral vascular disease ([place])   Gout   Essential hypertension   Type 2 diabetes mellitus without complication, without long-term current use of insulin (HCC)   Pure hypercholesterolemia   S/P insertion of iliac artery stent   PAF (paroxysmal atrial fibrillation) (HCC)   Elevated troponin   Fever   Positive D dimer   Acute respiratory failure with hypoxia (HCC)   Endotracheally intubated   Non-ST elevation (NSTEMI) myocardial infarction ([place])   Acute systolic heart failure ([place])  Acute hypoxemic respiratory failure status post intubation 8/10-status post extubation -Likelymultifactorial with HCAP as well as PE now noted on CT exam 8/15 -DuoNeb for potential component of air trapping with probable COPD -Cefepime discontinued after 8 days of treatment with noted left lower lobe pneumonia -Appreciate ongoing pulmonology recommendations  -Patient was initially doing well and was weaned off oxygen, but then on 8/20 developed acute decompensation and had to be put back on bipap -chest xray shows CHF pattern -EKG with sinus tachycardia -cycle troponins = no significant rise -He received Lasix with good urine output and improvement of respiratory status -He has been weaned off oxygen  Acute systolic CHF -EF of 93% on recent echo -Overall respirations improved after receiving IV Lasix -With mild bump in creatinine, Lasix changed to oral route -continue beta blocker, imdur and hydralazine  PE -Noted on CT chest 8/15 -Initially on IV heparin and hemoglobin has been stable -Currently on Eliquis  Left lower lobe pneumonia -Cefepime discontinued after 8 total days of treatment -Continue to monitor  NSTEMI -Continue on Plavix  -Continue atorvastatin -Beta-blocker to resume once blood pressure stabilizes -Limited 2D echocardiogramwith LVEF 35% and apical akinesis. Not a candidate for emergent catheterization at this time. May reflect stress-induced cardiomyopathy  versus MI. -Troponin downward trending with peak of 2500 -Discussed  with cardiology  with plans for cardiac catheterization in the outpatient setting  Acute right occipital CVA with vision loss to left eye -LDL 54 -A1c 6.2% -He was started on Eliquis with recommendation to continue Plavix by his vascular surgeon Dr. Rosana Hoes at [facility] will need 30-day Holter monitoring in outpatient setting, arrangements have been made -Appreciate neurology recommendations  AKI on CKD stage III -Likely cardiorenal with some volume overload noted from recent PRBC transfusion -Baseline creatinine 1.2-1.3 -Creatinine peak of 2.2 likely related to recent diuresis, IV contrast.  Currently creatinine 1.7 -Monitor repeat labs in a.m. -Hold ARB  Probable paroxysmal atrial fibrillation -Likely to require outpatient 30-day monitor for further evaluation given acute CVA -Elevated heart rates noted overnight on 8/12. Started on Lopressor 25 mg 3 times daily by cardiology, this has been increased to 37.5 mg 3 times daily  -Beta-blocker subsequently transitioned to Toprol. -IV metoprolol on board for heart rate elevationsas needed -anticoagulation with Eliquis  Anemia-chronic iron deficiency anemia noted-downtrending -Improved after 1 unit PRBC transfusionearlier, repeat PRBC transfusion on 8/16 -Plan to follow CBC -No overt bleeding currently noted -Restart heparin 8/17  -Hemoglobin stable.   Hematuria-resolved -Heparin drip discontinued 8/13 -Could be related to traumatic Foley insertion -No significant findings on renal ultrasound  Diabetes, non-insulin-dependent with nephropathy -Holding home oral Metformin and pioglitazone -Continue SSI with stable blood glucose readings noted -Start on glimepiride as an outpatient  History of COPD-Long history of tobacco abuse -We will need outpatient pulmonology follow-up for PFTs -Continue bronchodilators as needed  PVD -Status post  mesenteric artery bypass with recent bilateral [facility]ac [facility] 04/27/2020 -Plan to continue on Eliquis and Plavix outpatient and hold aspirin, per discussion with vascular surgeon at [facility] and once stable -Currently with bruising over the right groin region with pressure dressing applied. Anticoagulation held. -Pressure dressing removed 8/14 and bruising appears to be improving.  Hypertension-stable -Patient on metoprolol per cardiology  Discharge Instructions  Discharge Instructions    Diet - low sodium heart healthy   Complete by: As directed    Increase activity slowly   Complete by: As directed      Allergies as of 05/17/2020   No Known Allergies     Medication List    STOP taking these medications   aspirin 81 MG tablet   hydrochlorothiazide 25 MG tablet Commonly known as: HYDRODIURIL   metFORMIN 500 MG 24 hr tablet Commonly known as: GLUCOPHAGE-XR   metoprolol 200 MG 24 hr tablet Commonly known as: TOPROL-XL   pioglitazone 30 MG tablet Commonly known as: ACTOS   trandolapril 4 MG tablet Commonly known as: MAVIK   verapamil 240 MG CR tablet Commonly known as: CALAN-SR     TAKE these medications   allopurinol 100 MG tablet Commonly known as: ZYLOPRIM TAKE 1 TABLET 2 TO 3 TIMES A DAY FOR GOUT   apixaban 5 MG Tabs tablet Commonly known as: ELIQUIS Take 1 tablet (5 mg total) by mouth 2 (two) times daily.   atorvastatin 80 MG tablet Commonly known as: LIPITOR Take 1 tablet (80 mg total) by mouth daily. What changed:   medication strength  how much to take   carvedilol 6.25 MG tablet Commonly known as: COREG Take 1 tablet (6.25 mg total) by mouth 2 (two) times daily with a meal.   clopidogrel 75 MG tablet Commonly known as: PLAVIX Take 75 mg by mouth daily.   furosemide 40 MG tablet Commonly known as: LASIX Take 1 tablet (40  mg total) by mouth daily.   glimepiride 2 MG tablet Commonly known as: Amaryl Take 1 tablet (2  mg total) by mouth every morning.   hydrALAZINE 50 MG tablet Commonly known as: APRESOLINE Take 1 tablet (50 mg total) by mouth 3 (three) times daily. What changed: Another medication with the same name was removed. Continue taking this medication, and follow the directions you see here.   isosorbide mononitrate 30 MG 24 hr tablet Commonly known as: IMDUR Take 0.5 tablets (15 mg total) by mouth daily.   loratadine 10 MG tablet Commonly known as: CLARITIN Take 1 tablet (10 mg total) by mouth daily.   polyethylene glycol 17 g packet Commonly known as: MIRALAX / GLYCOLAX Take 17 g by mouth daily. What changed: how much to take       Follow-up Information    Verta Ellen., NP Follow up on 05/31/2020.   Specialty: Cardiology Why: [facility] Follow-up on 05/31/2020 at 2:30 PM.  Contact information: [place]  [id number] [phone]              No Known Allergies  Consultations:  Neurology  Cardiology  Pulmonology   Procedures/Studies: DG Chest 1 View  Result Date: 05/10/2020 CLINICAL DATA:  Chest pain and weakness. EXAM: CHEST  1 VIEW COMPARISON:  Single-view of the chest 05/05/2020. PA and lateral chest 03/15/2014. CT chest 05/08/2020. FINDINGS: There is cardiomegaly and vascular congestion. No consolidative process, pneumothorax or effusion. Aortic atherosclerosis noted. No acute or focal bony abnormality. IMPRESSION: Cardiomegaly and vascular congestion. Aortic Atherosclerosis (ICD10-I70.0). Electronically Signed   By: Inge Rise M.D.   On: 05/10/2020 13:58   DG Chest 2 View  Result Date: 05/03/2020 CLINICAL DATA:  Shortness of breath EXAM: CHEST - 2 VIEW COMPARISON:  03/15/2014 FINDINGS: Cardiac shadow is mildly prominent. Aortic calcifications are noted. Pleural calcifications are seen in the apices. No focal infiltrate or sizable effusion is seen. Degenerative changes of the thoracic spine are noted. Old left  clavicular fracture with healing is seen. IMPRESSION: No acute abnormality noted. Electronically Signed   By: Inez Catalina M.D.   On: 05/03/2020 13:33   CT Head Wo Contrast  Result Date: 05/01/2020 CLINICAL DATA:  Left side hemi anopsia, left arm paresthesias. EXAM: CT HEAD WITHOUT CONTRAST TECHNIQUE: Contiguous axial images were obtained from the base of the skull through the vertex without intravenous contrast. COMPARISON:  None. FINDINGS: Brain: Area of low-density noted in the right occipital lobe compatible with acute infarction. No other areas of infarct. Diffuse cerebral atrophy. No hydrocephalus. No hemorrhage. Vascular: No hyperdense vessel or unexpected calcification. Skull: No acute calvarial abnormality. Sinuses/Orbits: Visualized paranasal sinuses and mastoids clear. Orbital soft tissues unremarkable. Other: None IMPRESSION: Acute right occipital infarct. Electronically Signed   By: Rolm Baptise M.D.   On: 05/01/2020 17:24   CT ANGIO CHEST PE W OR WO CONTRAST  Addendum Date: 05/08/2020   ADDENDUM REPORT: 05/08/2020 17:52 ADDENDUM: These results were called by telephone at the time of interpretation on 05/08/2020 at 5:52 pm to provider [facility] , who verbally acknowledged these results. Electronically Signed   By: Zerita Boers M.D.   On: 05/08/2020 17:52   Result Date: 05/08/2020 CLINICAL DATA:  Concern for pulmonary embolism. EXAM: CT ANGIOGRAPHY CHEST WITH CONTRAST TECHNIQUE: Multidetector CT imaging of the chest was performed using the standard protocol during bolus administration of intravenous contrast. Multiplanar CT image reconstructions and MIPs were obtained to evaluate the vascular anatomy.  CONTRAST:  164mL OMNIPAQUE IOHEXOL 350 MG/ML SOLN COMPARISON:  Chest radiograph dated 05/05/2020. FINDINGS: Cardiovascular: Satisfactory opacification of the pulmonary arteries to the segmental level. A filling defect in a subsegmental pulmonary arteries supplying the right middle lobe is  consistent with an acute pulmonary embolism (series 5, images 150 8-162). Significant atherosclerotic calcifications are seen in the coronary arteries and aortic arch. The heart is enlarged. No pericardial effusion. Mediastinum/Nodes: There is mild bilateral hilar lymphadenopathy. No enlarged mediastinal or axillary lymph nodes. Thyroid gland, trachea, and esophagus demonstrate no significant findings. Lungs/Pleura: There are ground-glass opacities and consolidation in the left lower lobe near the lung base. There is mild bilateral dependent atelectasis. There is no pleural effusion or pneumothorax. Upper Abdomen: No acute abnormality. Musculoskeletal: There is a chronic appearing anterior wedge deformity of T12. Degenerative changes are seen in the spine. Review of the MIP images confirms the above findings. IMPRESSION: 1. Acute pulmonary embolism in a subsegmental pulmonary artery supplying the right middle lobe. 2. Ground-glass opacities and consolidation in the left lower lobe near the lung base likely represent pneumonia. 3. Mild bilateral hilar lymphadenopathy is likely reactive. Aortic Atherosclerosis (ICD10-I70.0). Electronically Signed: By: Zerita Boers M.D. On: 05/08/2020 17:47   MR ANGIO HEAD WO CONTRAST  Addendum Date: 05/02/2020   ADDENDUM REPORT: 05/02/2020 11:48 ADDENDUM: These results were called by telephone at the time of interpretation on 05/02/2020 at 11:47 am to provider Dr. Kathie Dike , who verbally acknowledged these results. Electronically Signed   By: Primitivo Gauze M.D.   On: 05/02/2020 11:48   Result Date: 05/02/2020 CLINICAL DATA:  Neuro deficit. EXAM: MR HEAD WITHOUT CONTRAST MR CIRCLE OF WILLIS WITHOUT CONTRAST MRA OF THE NECK WITHOUT CONTRAST TECHNIQUE: Multiplanar, multiecho pulse sequences of the brain, circle of willis and surrounding structures were obtained without intravenous contrast. Angiographic images of the neck were obtained using MRA technique without  intravenous contrast. COMPARISON:  05/01/2020 head CT. FINDINGS: MR HEAD FINDINGS Brain: Acute right occipital infarct. No intracranial hemorrhage. No midline shift, ventriculomegaly or extra-axial fluid collection. No mass lesion. Vascular: Please see MRA head. Skull and upper cervical spine: Normal marrow signal. Sinuses/Orbits: Normal orbits. Clear paranasal sinuses. Trace bilateral mastoid effusions. Other: None. MR CIRCLE OF WILLIS FINDINGS Image quality is degraded by motion artifact. Anterior circulation: Left A1 segment hypoplasia. Mild left A1 segment irregularity likely reflects atheromatous disease. Mild right and moderate to severe narrowing of the distal left ICA cervical segments. Vessel wall irregularity involving the petrous and intracavernous ICAs likely reflects atheromatous disease versus motion artifact. Short segment moderate to severe narrowing involving the proximal right and distal bilateral intra cavernous ICAs (1022:9). The bilateral intracavernous ICAs demonstrates short segment, mild pre-stenotic dilatation (1022: 8). No vascular malformation. Posterior circulation: Dominant left vertebral artery. Diminutive appearance of the right V4 segment after PICA takeoff. Atheromatous disease. No high-grade narrowing, occlusion or aneurysm involving the bilateral PCAs. Small focal outpouching involving the right PCA reflects an infundibulum. No significant stenosis, aneurysm, or vascular malformation. Venous sinuses: No evidence of thrombosis. Anatomic variants: Bilateral PCOM hypoplasia. MRA NECK FINDINGS Image quality is degraded by motion artifact. Three-vessel aortic arch. There is no high-grade narrowing or focal aneurysm involving the bilateral common or right internal carotid artery. Approximately 40% luminal narrowing of the proximal left ICA secondary to atheromatous disease. Diminutive appearance of the right vertebral artery. Proximal to the level of the glottis the right vertebral  artery is not visualized, concerning for proximal occlusion. The left vertebral artery is patent  and demonstrates antegrade flow. No evidence of high-grade narrowing or focal aneurysm involving left vertebral artery. Dominant left vertebral artery. IMPRESSION: 1. Acute right occipital infarct. 2. Nonvisualization of the proximal right vertebral artery inferior to the level of the glottis, concerning for occlusion. 3. Atheromatous disease with multifocal intracranial stenoses most notably is follows. 4. Short-segment moderate to severe proximal right and distal bilateral intracavernous ICA narrowing. Both of the intracavernous ICAs demonstrate mild short-segment pre stenotic dilatation. 5. Moderate to severe short segment narrowing of the distal left ICA cervical segment. 6. Approximately 40% luminal narrowing of the proximal left ICA, distal to the bifurcation. 7. Please note that motion artifact on MRA head and neck imaging limits evaluation. Electronically Signed: By: Primitivo Gauze M.D. On: 05/02/2020 11:41   MR ANGIO NECK WO CONTRAST  Addendum Date: 05/02/2020   ADDENDUM REPORT: 05/02/2020 11:48 ADDENDUM: These results were called by telephone at the time of interpretation on 05/02/2020 at 11:47 am to provider Dr. Kathie Dike , who verbally acknowledged these results. Electronically Signed   By: Primitivo Gauze M.D.   On: 05/02/2020 11:48   Result Date: 05/02/2020 CLINICAL DATA:  Neuro deficit. EXAM: MR HEAD WITHOUT CONTRAST MR CIRCLE OF WILLIS WITHOUT CONTRAST MRA OF THE NECK WITHOUT CONTRAST TECHNIQUE: Multiplanar, multiecho pulse sequences of the brain, circle of willis and surrounding structures were obtained without intravenous contrast. Angiographic images of the neck were obtained using MRA technique without intravenous contrast. COMPARISON:  05/01/2020 head CT. FINDINGS: MR HEAD FINDINGS Brain: Acute right occipital infarct. No intracranial hemorrhage. No midline shift, ventriculomegaly or  extra-axial fluid collection. No mass lesion. Vascular: Please see MRA head. Skull and upper cervical spine: Normal marrow signal. Sinuses/Orbits: Normal orbits. Clear paranasal sinuses. Trace bilateral mastoid effusions. Other: None. MR CIRCLE OF WILLIS FINDINGS Image quality is degraded by motion artifact. Anterior circulation: Left A1 segment hypoplasia. Mild left A1 segment irregularity likely reflects atheromatous disease. Mild right and moderate to severe narrowing of the distal left ICA cervical segments. Vessel wall irregularity involving the petrous and intracavernous ICAs likely reflects atheromatous disease versus motion artifact. Short segment moderate to severe narrowing involving the proximal right and distal bilateral intra cavernous ICAs (1022:9). The bilateral intracavernous ICAs demonstrates short segment, mild pre-stenotic dilatation (1022: 8). No vascular malformation. Posterior circulation: Dominant left vertebral artery. Diminutive appearance of the right V4 segment after PICA takeoff. Atheromatous disease. No high-grade narrowing, occlusion or aneurysm involving the bilateral PCAs. Small focal outpouching involving the right PCA reflects an infundibulum. No significant stenosis, aneurysm, or vascular malformation. Venous sinuses: No evidence of thrombosis. Anatomic variants: Bilateral PCOM hypoplasia. MRA NECK FINDINGS Image quality is degraded by motion artifact. Three-vessel aortic arch. There is no high-grade narrowing or focal aneurysm involving the bilateral common or right internal carotid artery. Approximately 40% luminal narrowing of the proximal left ICA secondary to atheromatous disease. Diminutive appearance of the right vertebral artery. Proximal to the level of the glottis the right vertebral artery is not visualized, concerning for proximal occlusion. The left vertebral artery is patent and demonstrates antegrade flow. No evidence of high-grade narrowing or focal aneurysm  involving left vertebral artery. Dominant left vertebral artery. IMPRESSION: 1. Acute right occipital infarct. 2. Nonvisualization of the proximal right vertebral artery inferior to the level of the glottis, concerning for occlusion. 3. Atheromatous disease with multifocal intracranial stenoses most notably is follows. 4. Short-segment moderate to severe proximal right and distal bilateral intracavernous ICA narrowing. Both of the intracavernous ICAs demonstrate mild short-segment pre  stenotic dilatation. 5. Moderate to severe short segment narrowing of the distal left ICA cervical segment. 6. Approximately 40% luminal narrowing of the proximal left ICA, distal to the bifurcation. 7. Please note that motion artifact on MRA head and neck imaging limits evaluation. Electronically Signed: By: Primitivo Gauze M.D. On: 05/02/2020 11:41   MR BRAIN WO CONTRAST  Addendum Date: 05/02/2020   ADDENDUM REPORT: 05/02/2020 11:48 ADDENDUM: These results were called by telephone at the time of interpretation on 05/02/2020 at 11:47 am to provider Dr. Kathie Dike , who verbally acknowledged these results. Electronically Signed   By: Primitivo Gauze M.D.   On: 05/02/2020 11:48   Result Date: 05/02/2020 CLINICAL DATA:  Neuro deficit. EXAM: MR HEAD WITHOUT CONTRAST MR CIRCLE OF WILLIS WITHOUT CONTRAST MRA OF THE NECK WITHOUT CONTRAST TECHNIQUE: Multiplanar, multiecho pulse sequences of the brain, circle of willis and surrounding structures were obtained without intravenous contrast. Angiographic images of the neck were obtained using MRA technique without intravenous contrast. COMPARISON:  05/01/2020 head CT. FINDINGS: MR HEAD FINDINGS Brain: Acute right occipital infarct. No intracranial hemorrhage. No midline shift, ventriculomegaly or extra-axial fluid collection. No mass lesion. Vascular: Please see MRA head. Skull and upper cervical spine: Normal marrow signal. Sinuses/Orbits: Normal orbits. Clear paranasal sinuses.  Trace bilateral mastoid effusions. Other: None. MR CIRCLE OF WILLIS FINDINGS Image quality is degraded by motion artifact. Anterior circulation: Left A1 segment hypoplasia. Mild left A1 segment irregularity likely reflects atheromatous disease. Mild right and moderate to severe narrowing of the distal left ICA cervical segments. Vessel wall irregularity involving the petrous and intracavernous ICAs likely reflects atheromatous disease versus motion artifact. Short segment moderate to severe narrowing involving the proximal right and distal bilateral intra cavernous ICAs (1022:9). The bilateral intracavernous ICAs demonstrates short segment, mild pre-stenotic dilatation (1022: 8). No vascular malformation. Posterior circulation: Dominant left vertebral artery. Diminutive appearance of the right V4 segment after PICA takeoff. Atheromatous disease. No high-grade narrowing, occlusion or aneurysm involving the bilateral PCAs. Small focal outpouching involving the right PCA reflects an infundibulum. No significant stenosis, aneurysm, or vascular malformation. Venous sinuses: No evidence of thrombosis. Anatomic variants: Bilateral PCOM hypoplasia. MRA NECK FINDINGS Image quality is degraded by motion artifact. Three-vessel aortic arch. There is no high-grade narrowing or focal aneurysm involving the bilateral common or right internal carotid artery. Approximately 40% luminal narrowing of the proximal left ICA secondary to atheromatous disease. Diminutive appearance of the right vertebral artery. Proximal to the level of the glottis the right vertebral artery is not visualized, concerning for proximal occlusion. The left vertebral artery is patent and demonstrates antegrade flow. No evidence of high-grade narrowing or focal aneurysm involving left vertebral artery. Dominant left vertebral artery. IMPRESSION: 1. Acute right occipital infarct. 2. Nonvisualization of the proximal right vertebral artery inferior to the level of  the glottis, concerning for occlusion. 3. Atheromatous disease with multifocal intracranial stenoses most notably is follows. 4. Short-segment moderate to severe proximal right and distal bilateral intracavernous ICA narrowing. Both of the intracavernous ICAs demonstrate mild short-segment pre stenotic dilatation. 5. Moderate to severe short segment narrowing of the distal left ICA cervical segment. 6. Approximately 40% luminal narrowing of the proximal left ICA, distal to the bifurcation. 7. Please note that motion artifact on MRA head and neck imaging limits evaluation. Electronically Signed: By: Primitivo Gauze M.D. On: 05/02/2020 11:41   US RENAL  Result Date: 05/06/2020 CLINICAL DATA:  Hematuria EXAM: RENAL ULTRASOUND COMPARISON:  CT abdomen and pelvis October 13, 2018  FINDINGS: Right Kidney: Renal measurements: 11.8 x 5.3 x 6.2 cm = volume: 200.4 mL . Echogenicity and renal cortical thickness are within normal limits. No mass, perinephric fluid, or hydronephrosis visualized. No sonographically demonstrable calculus or ureterectasis. Left Kidney: Renal measurements: 12.7 x 5.7 x 6.0 cm = volume: 226.0 mL. Echogenicity and renal cortical thickness are within normal limits. No mass, perinephric fluid, or hydronephrosis visualized. No sonographically demonstrable calculus or ureterectasis. Bladder: Unable to visualize with Foley catheter in place. Other: None. IMPRESSION: Normal appearing kidneys bilaterally. Unable to visualize urinary bladder with Foley catheter in place. Electronically Signed   By: Lowella Grip III M.D.   On: 05/06/2020 09:21   DG Chest Port 1 View  Result Date: 05/13/2020 CLINICAL DATA:  Increased shortness of breath EXAM: PORTABLE CHEST 1 VIEW COMPARISON:  Three days ago FINDINGS: Cardiomegaly and vascular pedicle widening. Diffuse interstitial opacity with cephalized flow. No definite or large effusion. No pneumothorax. Healed left mid clavicle fracture. IMPRESSION: CHF  pattern. Electronically Signed   By: Monte Fantasia M.D.   On: 05/13/2020 09:31   DG Chest Port 1 View  Result Date: 05/05/2020 CLINICAL DATA:  Respiratory failure, intubated EXAM: PORTABLE CHEST 1 VIEW COMPARISON:  Chest radiograph from one day prior. FINDINGS: Endotracheal tube tip is 2.9 cm above the carina. Enteric tube enters stomach with the tip not seen on this image. Stable cardiomediastinal silhouette with mild cardiomegaly. No pneumothorax. Probable small bilateral pleural effusions, slightly increased on the right. Mild diffuse prominence of the parahilar interstitial markings is similar. Mildly increased right basilar atelectasis. Stable left basilar atelectasis. IMPRESSION: 1. Well-positioned support structures. 2. Stable mild cardiomegaly and mild pulmonary edema. 3. Probable small bilateral pleural effusions, slightly increased on the right. 4. Mildly increased right basilar atelectasis. Electronically Signed   By: Ilona Sorrel M.D.   On: 05/05/2020 09:38   DG CHEST PORT 1 VIEW  Result Date: 05/04/2020 CLINICAL DATA:  Respiratory failure. EXAM: PORTABLE CHEST 1 VIEW COMPARISON:  05/03/2020 FINDINGS: The endotracheal tube is 3.2 cm above the carina. The NG tube is coursing down the esophagus and into the stomach. The heart is enlarged but stable. Stable tortuosity and calcification of the thoracic aorta. Increased density at the left lung base could suggest atelectasis or infiltrate. Streaky right basilar atelectasis also noted. No pulmonary edema or pneumothorax. IMPRESSION: 1. Stable support apparatus. 2. Persistent increased left basilar density suggesting atelectasis or infiltrate. 3. Minimal right basilar atelectasis. Electronically Signed   By: Marijo Sanes M.D.   On: 05/04/2020 07:24   DG CHEST PORT 1 VIEW  Result Date: 05/03/2020 CLINICAL DATA:  Shortness of breath, intubated EXAM: PORTABLE CHEST 1 VIEW COMPARISON:  05/03/2020 FINDINGS: Endotracheal tube is 4 cm above the carina.  NG tube enters the stomach. Bilateral perihilar airspace opacities and interstitial prominence concerning for edema. Heart is borderline in size. No visible effusions or acute bony abnormality. IMPRESSION: Bilateral perihilar opacities and diffuse interstitial prominence, likely edema/CHF. Electronically Signed   By: Rolm Baptise M.D.   On: 05/03/2020 21:29   ECHOCARDIOGRAM COMPLETE  Result Date: 05/02/2020    ECHOCARDIOGRAM REPORT   Patient Name:   CUTBERTO WINFREE Date of Exam: 05/02/2020 Medical Rec #:  [id number]      Height:       68.0 in Accession #:    [id number]     Weight:       228.0 lb Date of Birth:  27-Sep-1945      BSA:  2.161 m Patient Age:    30 years       BP:           163/54 mmHg Patient Gender: M              HR:           66 bpm. Exam Location:  [facility] Procedure: 2D Echo, Cardiac Doppler and Color Doppler Indications:    CVA  History:        Patient has no prior history of Echocardiogram examinations.                 Signs/Symptoms:Murmur; Risk Factors:Hypertension, Dyslipidemia                 and Diabetes. Vision loss.  Sonographer:    Dustin Flock RDCS Referring Phys: [place]  1. Left ventricular ejection fraction, by estimation, is 60 to 65%. The left ventricle has normal function. Left ventricular endocardial border not optimally defined to evaluate regional wall motion. The left ventricular internal cavity size was mildly dilated. There is mild left ventricular hypertrophy. Left ventricular diastolic parameters are indeterminate.  2. Right ventricular systolic function is normal. The right ventricular size is normal.  3. The mitral valve is abnormal. Trivial mitral valve regurgitation.  4. AV is thickened, calcified with mildly restricted motion. Peak and mean gradients through the valve are 31 and 18 mm Hg respectively. . The aortic valve is abnormal. Aortic valve regurgitation is not visualized. FINDINGS  Left Ventricle: Left ventricular  ejection fraction, by estimation, is 60 to 65%. The left ventricle has normal function. Left ventricular endocardial border not optimally defined to evaluate regional wall motion. The left ventricular internal cavity size was mildly dilated. There is mild left ventricular hypertrophy. Left ventricular diastolic parameters are indeterminate. Right Ventricle: The right ventricular size is normal. Right vetricular wall thickness was not assessed. Right ventricular systolic function is normal. Left Atrium: Left atrial size was normal in size. Right Atrium: Right atrial size was normal in size. Pericardium: There is no evidence of pericardial effusion. Mitral Valve: The mitral valve is abnormal. There is mild thickening of the mitral valve leaflet(s). Trivial mitral valve regurgitation. Tricuspid Valve: The tricuspid valve is grossly normal. Tricuspid valve regurgitation is not demonstrated. Aortic Valve: AV is thickened, calcified with mildly restricted motion. Peak and mean gradients through the valve are 31 and 18 mm Hg respectively. The aortic valve is abnormal. Aortic valve regurgitation is not visualized. Aortic valve mean gradient measures 17.5 mmHg. Aortic valve peak gradient measures 28.2 mmHg. Aortic valve area, by VTI measures 2.55 cm. Pulmonic Valve: The pulmonic valve was not well visualized. Pulmonic valve regurgitation is not visualized. No evidence of pulmonic stenosis. Aorta: The aortic root is normal in size and structure. IAS/Shunts: The interatrial septum was not assessed.  LEFT VENTRICLE PLAX 2D LVIDd:         5.52 cm  Diastology LVIDs:         4.31 cm  LV e' lateral:   13.40 cm/s LV PW:         1.58 cm  LV E/e' lateral: 7.5 LV IVS:        1.24 cm  LV e' medial:    6.09 cm/s LVOT diam:     3.00 cm  LV E/e' medial:  16.4 LV SV:         175 LV SV Index:   81 LVOT Area:     7.07  cm  RIGHT VENTRICLE RV Basal diam:  3.16 cm RV S prime:     12.70 cm/s TAPSE (M-mode): 2.8 cm LEFT ATRIUM             Index        RIGHT ATRIUM           Index LA diam:        5.20 cm 2.41 cm/m  RA Area:     15.30 cm LA Vol (A2C):   38.6 ml 17.86 ml/m RA Volume:   36.50 ml  16.89 ml/m LA Vol (A4C):   52.3 ml 24.20 ml/m LA Biplane Vol: 44.7 ml 20.69 ml/m  AORTIC VALVE AV Area (Vmax):    2.63 cm AV Area (Vmean):   2.51 cm AV Area (VTI):     2.55 cm AV Vmax:           265.67 cm/s AV Vmean:          200.500 cm/s AV VTI:            0.686 m AV Peak Grad:      28.2 mmHg AV Mean Grad:      17.5 mmHg LVOT Vmax:         99.00 cm/s LVOT Vmean:        71.300 cm/s LVOT VTI:          0.247 m LVOT/AV VTI ratio: 0.36  AORTA Ao Root diam: 4.00 cm MITRAL VALVE MV Area (PHT): 2.84 cm    SHUNTS MV Decel Time: 267 msec    Systemic VTI:  0.25 m MV E velocity: 99.90 cm/s  Systemic Diam: 3.00 cm MV A velocity: 94.60 cm/s MV E/A ratio:  1.06 Dorris Carnes MD Electronically signed by Dorris Carnes MD Signature Date/Time: 05/02/2020/5:25:05 PM    Final    ECHOCARDIOGRAM LIMITED  Result Date: 05/04/2020    ECHOCARDIOGRAM LIMITED REPORT   Patient Name:   WILLIS HOLQUIN Date of Exam: 05/04/2020 Medical Rec #:  [id number]      Height:       68.0 in Accession #:    [id number]     Weight:       228.0 lb Date of Birth:  1946/02/22      BSA:          2.161 m Patient Age:    74 years       BP:           126/53 mmHg Patient Gender: M              HR:           108 bpm. Exam Location:  [facility] Procedure: Limited Echo and Intracardiac Opacification Agent Indications:    Elevated Troponin  History:        Patient has prior history of Echocardiogram examinations, most                 recent 05/02/2020. Stroke; Risk Factors:Diabetes, Hypertension,                 Dyslipidemia and Former Smoker. Elevated Troponin.  Sonographer:    Leavy Cella RDCS (AE) Referring Phys: [id number] [place]  1. The entire apex from mid to distal ventricle is akinetic. Pattern can be consistent with stress induced cardiomyopathy/Takotsubo CM, however cannot exclude ischemic  etiology     . Left ventricular ejection fraction, by estimation, is 35%. The left ventricle has moderately decreased function.  2. The inferior  vena cava is normal in size with greater than 50% respiratory variability, suggesting right atrial pressure of 3 mmHg. FINDINGS  Left Ventricle: The entire apex from mid to distal ventricle is akinetic. Pattern can be consistent with stress induced cardiomyopathy/Takotsubo CM, however cannot exclude ischemic etiology. Left ventricular ejection fraction, by estimation, is 35%. The left ventricle has moderately decreased function. Definity contrast agent was given IV to delineate the left ventricular endocardial borders. Pericardium: There is no evidence of pericardial effusion. Aortic Valve: Aortic valve mean gradient measures 12.0 mmHg. Aortic valve peak gradient measures 22.8 mmHg. Venous: The inferior vena cava is normal in size with greater than 50% respiratory variability, suggesting right atrial pressure of 3 mmHg. LEFT VENTRICLE PLAX 2D LVIDd:         5.61 cm Diastology LVIDs:         4.50 cm LV e' lateral:   6.74 cm/s LV PW:         1.33 cm LV E/e' lateral: 20.0 LV IVS:        1.37 cm LV e' medial:    8.70 cm/s                        LV E/e' medial:  15.5  LEFT ATRIUM         Index LA diam:    4.50 cm 2.08 cm/m  AORTIC VALVE AV Vmax:           239.00 cm/s AV Vmean:          161.000 cm/s AV VTI:            0.382 m AV Peak Grad:      22.8 mmHg AV Mean Grad:      12.0 mmHg LVOT Vmax:         98.20 cm/s LVOT Vmean:        59.400 cm/s LVOT VTI:          0.152 m LVOT/AV VTI ratio: 0.40  AORTA Ao Root diam: 3.20 cm MITRAL VALVE MV Area (PHT): 7.37 cm     SHUNTS MV Decel Time: 103 msec     Systemic VTI: 0.15 m MV E velocity: 135.00 cm/s Carlyle Dolly MD Electronically signed by Carlyle Dolly MD Signature Date/Time: 05/04/2020/4:05:41 PM    Final       Subjective: Shortness of breath has improved.  No chest pain.  Discharge Exam: Vitals:   05/17/20 0300  05/17/20 0400 05/17/20 0700 05/17/20 0750  BP: (!) 147/50  (!) 169/41   Pulse: 64  (!) 59   Resp: 19  13   Temp:  98.5 F (36.9 C)  98.3 F (36.8 C)  TempSrc:  Oral  Oral  SpO2: (!) 87%  96%   Weight:      Height:        General: Pt is alert, awake, not in acute distress Cardiovascular: RRR, S1/S2 +, no rubs, no gallops Respiratory: CTA bilaterally, no wheezing, no rhonchi Abdominal: Soft, NT, ND, bowel sounds + Extremities: no edema, no cyanosis    The results of significant diagnostics from this hospitalization (including imaging, microbiology, ancillary and laboratory) are listed below for reference.     Microbiology: No results found for this or any previous visit (from the past 240 hour(s)).   Labs: BNP (last 3 results) Recent Labs    05/03/20 2133 05/05/20 0319 05/07/20 0653  BNP 1,082.0* 764.0* 357.0*   Basic Metabolic Panel: Recent Labs  Lab 05/11/20  9211 05/11/20 0439 05/12/20 0422 05/12/20 0422 05/13/20 0500 05/14/20 0828 05/15/20 0451 05/16/20 0420 05/17/20 0634  NA 138   < > 141   < > 141 140 139 139 141  K 3.0*   < > 4.1   < > 4.0 3.8 3.5 3.5 3.6  CL 105   < > 107   < > 109 106 102 104 103  CO2 23   < > 22   < > 22 24 25 25 26   GLUCOSE 136*   < > 136*   < > 150* 140* 131* 122* 154*  BUN 38*   < > 38*   < > 30* 31* 31* 28* 28*  CREATININE 2.27*   < > 2.19*   < > 1.77* 1.95* 1.85* 1.70* 1.78*  CALCIUM 8.3*   < > 9.0   < > 8.9 8.9 9.2 8.7* 8.8*  MG 1.8  --   --   --   --   --   --   --   --   PHOS  --   --  3.2  --  3.3  --   --   --   --    < > = values in this interval not displayed.   Liver Function Tests: Recent Labs  Lab 05/12/20 0422 05/13/20 0500  ALBUMIN 2.8* 2.9*   No results for input(s): LIPASE, AMYLASE in the last 168 hours. No results for input(s): AMMONIA in the last 168 hours. CBC: Recent Labs  Lab 05/13/20 0553 05/14/20 0828 05/15/20 0451 05/16/20 0420 05/17/20 0634  WBC 5.4 6.0 8.0 6.7 5.9  HGB 9.0* 9.3* 9.2*  8.4* 9.2*  HCT 28.6* 30.2* 29.6* 27.3* 29.5*  MCV 94.4 95.0 94.3 93.5 93.1  PLT 222 233 234 210 216   Cardiac Enzymes: No results for input(s): CKTOTAL, CKMB, CKMBINDEX, TROPONINI in the last 168 hours. BNP: Invalid input(s): POCBNP CBG: Recent Labs  Lab 05/16/20 0742 05/16/20 1113 05/16/20 1622 05/16/20 2116 05/17/20 0752  GLUCAP 117* 164* 117* 185* 140*   D-Dimer No results for input(s): DDIMER in the last 72 hours. Hgb A1c No results for input(s): HGBA1C in the last 72 hours. Lipid Profile Recent Labs    05/16/20 0420  TRIG 71   Thyroid function studies No results for input(s): TSH, T4TOTAL, T3FREE, THYROIDAB in the last 72 hours.  Invalid input(s): FREET3 Anemia work up No results for input(s): VITAMINB12, FOLATE, FERRITIN, TIBC, IRON, RETICCTPCT in the last 72 hours. Urinalysis    Component Value Date/Time   COLORURINE RED (A) 05/04/2020 1555   APPEARANCEUR TURBID (A) 05/04/2020 1555   LABSPEC  05/04/2020 1555    TEST NOT REPORTED DUE TO COLOR INTERFERENCE OF URINE PIGMENT   PHURINE  05/04/2020 1555    TEST NOT REPORTED DUE TO COLOR INTERFERENCE OF URINE PIGMENT   GLUCOSEU (A) 05/04/2020 1555    TEST NOT REPORTED DUE TO COLOR INTERFERENCE OF URINE PIGMENT   HGBUR (A) 05/04/2020 1555    TEST NOT REPORTED DUE TO COLOR INTERFERENCE OF URINE PIGMENT   BILIRUBINUR (A) 05/04/2020 1555    TEST NOT REPORTED DUE TO COLOR INTERFERENCE OF URINE PIGMENT   KETONESUR (A) 05/04/2020 1555    TEST NOT REPORTED DUE TO COLOR INTERFERENCE OF URINE PIGMENT   PROTEINUR (A) 05/04/2020 1555    TEST NOT REPORTED DUE TO COLOR INTERFERENCE OF URINE PIGMENT   UROBILINOGEN 1.0 03/04/2014 1358   NITRITE (A) 05/04/2020 1555    TEST NOT REPORTED DUE  TO COLOR INTERFERENCE OF URINE PIGMENT   LEUKOCYTESUR (A) 05/04/2020 1555    TEST NOT REPORTED DUE TO COLOR INTERFERENCE OF URINE PIGMENT   Sepsis Labs Invalid input(s): PROCALCITONIN,  WBC,  LACTICIDVEN Microbiology No results found  for this or any previous visit (from the past 240 hour(s)).   Time coordinating discharge: 52mins  SIGNED:   Kathie Dike, MD  [facility] 05/17/2020, 8:46 PM   If 7PM-7AM, please contact night-coverage [email or web address]

## 2020-05-17 NOTE — Progress Notes (Signed)
Nsg Discharge Note  Admit Date:  05/01/2020 Discharge date: 05/17/2020   Connor Bryant to be D/C'd Home per MD order.  AVS completed.  Copy for chart, and copy for patient signed, and dated. Patient/caregiver able to verbalize understanding.  Discharge Medication: Allergies as of 05/17/2020   No Known Allergies     Medication List    STOP taking these medications   aspirin 81 MG tablet   hydrochlorothiazide 25 MG tablet Commonly known as: HYDRODIURIL   metFORMIN 500 MG 24 hr tablet Commonly known as: GLUCOPHAGE-XR   metoprolol 200 MG 24 hr tablet Commonly known as: TOPROL-XL   pioglitazone 30 MG tablet Commonly known as: ACTOS   trandolapril 4 MG tablet Commonly known as: MAVIK   verapamil 240 MG CR tablet Commonly known as: CALAN-SR     TAKE these medications   allopurinol 100 MG tablet Commonly known as: ZYLOPRIM TAKE 1 TABLET 2 TO 3 TIMES A DAY FOR GOUT   apixaban 5 MG Tabs tablet Commonly known as: ELIQUIS Take 1 tablet (5 mg total) by mouth 2 (two) times daily.   atorvastatin 80 MG tablet Commonly known as: LIPITOR Take 1 tablet (80 mg total) by mouth daily. What changed:   medication strength  how much to take   carvedilol 6.25 MG tablet Commonly known as: COREG Take 1 tablet (6.25 mg total) by mouth 2 (two) times daily with a meal.   clopidogrel 75 MG tablet Commonly known as: PLAVIX Take 75 mg by mouth daily.   furosemide 40 MG tablet Commonly known as: LASIX Take 1 tablet (40 mg total) by mouth daily.   glimepiride 2 MG tablet Commonly known as: Amaryl Take 1 tablet (2 mg total) by mouth every morning.   hydrALAZINE 50 MG tablet Commonly known as: APRESOLINE Take 1 tablet (50 mg total) by mouth 3 (three) times daily. What changed: Another medication with the same name was removed. Continue taking this medication, and follow the directions you see here.   isosorbide mononitrate 30 MG 24 hr tablet Commonly known as: IMDUR Take 0.5  tablets (15 mg total) by mouth daily.   loratadine 10 MG tablet Commonly known as: CLARITIN Take 1 tablet (10 mg total) by mouth daily.   polyethylene glycol 17 g packet Commonly known as: MIRALAX / GLYCOLAX Take 17 g by mouth daily. What changed: how much to take       Discharge Assessment: Vitals:   05/17/20 0700 05/17/20 0750  BP: (!) 169/41   Pulse: (!) 59   Resp: 13   Temp:  98.3 F (36.8 C)  SpO2: 96%    Skin clean, dry and intact without evidence of skin break down, no evidence of skin tears noted. IV catheter discontinued intact. Site without signs and symptoms of complications - no redness or edema noted at insertion site, patient denies c/o pain - only slight tenderness at site.  Dressing with slight pressure applied.  D/c Instructions-Education: Discharge instructions given to patient/family with verbalized understanding. D/c education completed with patient/family including follow up instructions, medication list, d/c activities limitations if indicated, with other d/c instructions as indicated by MD - patient able to verbalize understanding, all questions fully answered. Patient instructed to return to ED, call 911, or call MD for any changes in condition.  Patient escorted via [place], and D/C home via private auto.  Carney Corners, RN 05/17/2020 8:36 AM

## 2020-05-17 NOTE — Progress Notes (Deleted)
Physician Discharge Summary  Connor Bryant ZDG:[id number] DOB: February 26, 1946 DOA: 05/01/2020  PCP: Claretta Fraise, MD  Admit date: 05/01/2020 Discharge date: 05/17/2020  Admitted From: Home Disposition: Home   Recommendations for Outpatient Follow-up:  1. Follow up with PCP in 1-2 weeks 2. Please obtain BMP/CBC in one week 3. Follow-up with cardiology as an outpatient 4. Follow-up with vascular surgeon as previously scheduled  Home Health: Home health PT, RN Equipment/Devices:  Discharge Condition: Stable CODE STATUS: DNR Diet recommendation: Heart healthy, carb modified  Brief/Interim Summary: RJJ:OACZYS C Estesis a 74 y.o.malewith medical history significant forrecent iliac stents, diabetes mellitus, hypertension. Patient presented to the ED today with complaints of losing vision in his left eye,started yesterday.Patient woke up yesterday-Saturday morning with this, and this persisted today. Reports it feels like a dark shadow on the peripheral side ofhis left eye. He also reports some tingling in his left arm-this has resolved. No strokes. He denies weakness of his extremities. No change in speech, no facial asymmetry. Patient took his aspirin and Plavix and statins today. Patient had bilateral iliac stents,2 on the right, one on the left placed about 5 days ago at [facility].  The only cardiac history patient is aware of is a cardiac murmur. He is not aware of history of irregular heart rhythm.  ED Course:Blood pressure systolic 1 1 5-1 2 7, heart rate 49-59,hemoglobin 8.6,recent baseline about 11.Creatinine 1.7, about baseline. Head CT shows acute right occipital infarct. EKG showing atrial fibrillation. Telemetry neurology consulted-patient outside TPA window, stroke work-up.Dualantiplatelet for now. Hospitalist to admit for acute CVA.  Discharge Diagnoses:  Principal Problem:   Acute CVA (cerebrovascular accident) [facility]) Active  Problems:   Peripheral vascular disease ([place])   Gout   Essential hypertension   Type 2 diabetes mellitus without complication, without long-term current use of insulin (HCC)   Pure hypercholesterolemia   S/P insertion of iliac artery stent   PAF (paroxysmal atrial fibrillation) (HCC)   Elevated troponin   Fever   Positive D dimer   Acute respiratory failure with hypoxia (HCC)   Endotracheally intubated   Non-ST elevation (NSTEMI) myocardial infarction ([place])   Acute systolic heart failure ([place])  Acute hypoxemic respiratory failure status post intubation 8/10-status post extubation -Likelymultifactorial with HCAP as well as PE now noted on CT exam 8/15 -DuoNeb for potential component of air trapping with probable COPD -Cefepime discontinued after 8 days of treatment with noted left lower lobe pneumonia -Appreciate ongoing pulmonology recommendations  -Patient was initially doing well and was weaned off oxygen, but then on 8/20 developed acute decompensation and had to be put back on bipap -chest xray shows CHF pattern -EKG with sinus tachycardia -cycle troponins = no significant rise -He received Lasix with good urine output and improvement of respiratory status -He has been weaned off oxygen  Acute systolic CHF -EF of 06% on recent echo -Overall respirations improved after receiving IV Lasix -With mild bump in creatinine, Lasix changed to oral route -continue beta blocker, imdur and hydralazine  PE -Noted on CT chest 8/15 -Initially on IV heparin and hemoglobin has been stable -Currently on Eliquis  Left lower lobe pneumonia -Cefepime discontinued after 8 total days of treatment -Continue to monitor  NSTEMI -Continue on Plavix  -Continue atorvastatin -Beta-blocker to resume once blood pressure stabilizes -Limited 2D echocardiogramwith LVEF 35% and apical akinesis. Not a candidate for emergent catheterization at this time. May reflect stress-induced cardiomyopathy  versus MI. -Troponin downward trending with peak of 2500 -Discussed  with cardiology  with plans for cardiac catheterization in the outpatient setting  Acute right occipital CVA with vision loss to left eye -LDL 54 -A1c 6.2% -He was started on Eliquis with recommendation to continue Plavix by his vascular surgeon Dr. Rosana Hoes at [place] will need 30-day Holter monitoring in outpatient setting, arrangements have been made -Appreciate neurology recommendations  AKI on CKD stage III -Likely cardiorenal with some volume overload noted from recent PRBC transfusion -Baseline creatinine 1.2-1.3 -Creatinine peak of 2.2 likely related to recent diuresis, IV contrast.  Currently creatinine 1.7 -Monitor repeat labs in a.m. -Hold ARB  Probable paroxysmal atrial fibrillation -Likely to require outpatient 30-day monitor for further evaluation given acute CVA -Elevated heart rates noted overnight on 8/12. Started on Lopressor 25 mg 3 times daily by cardiology, this has been increased to 37.5 mg 3 times daily  -Beta-blocker subsequently transitioned to Toprol. -IV metoprolol on board for heart rate elevationsas needed -anticoagulation with Eliquis  Anemia-chronic iron deficiency anemia noted-downtrending -Improved after 1 unit PRBC transfusionearlier, repeat PRBC transfusion on 8/16 -Plan to follow CBC -No overt bleeding currently noted -Restart heparin 8/17  -Hemoglobin stable.   Hematuria-resolved -Heparin drip discontinued 8/13 -Could be related to traumatic Foley insertion -No significant findings on renal ultrasound  Diabetes, non-insulin-dependent with nephropathy -Holding home oral Metformin and pioglitazone -Continue SSI with stable blood glucose readings noted -Start on glimepiride as an outpatient  History of COPD-Long history of tobacco abuse -We will need outpatient pulmonology follow-up for PFTs -Continue bronchodilators as needed  PVD -Status post  mesenteric artery bypass with recent bilateral iliac stenting at [facility] 04/27/2020 -Plan to continue on Eliquis and Plavix outpatient and hold aspirin, per discussion with vascular surgeon at [facility] and once stable -Currently with bruising over the right groin region with pressure dressing applied. Anticoagulation held. -Pressure dressing removed 8/14 and bruising appears to be improving.  Hypertension-stable -Patient on metoprolol per cardiology  Discharge Instructions  Discharge Instructions    Diet - low sodium heart healthy   Complete by: As directed    Increase activity slowly   Complete by: As directed      Allergies as of 05/17/2020   No Known Allergies     Medication List    STOP taking these medications   aspirin 81 MG tablet   hydrochlorothiazide 25 MG tablet Commonly known as: HYDRODIURIL   metFORMIN 500 MG 24 hr tablet Commonly known as: GLUCOPHAGE-XR   metoprolol 200 MG 24 hr tablet Commonly known as: TOPROL-XL   pioglitazone 30 MG tablet Commonly known as: ACTOS   trandolapril 4 MG tablet Commonly known as: MAVIK   verapamil 240 MG CR tablet Commonly known as: CALAN-SR     TAKE these medications   allopurinol 100 MG tablet Commonly known as: ZYLOPRIM TAKE 1 TABLET 2 TO 3 TIMES A DAY FOR GOUT   apixaban 5 MG Tabs tablet Commonly known as: ELIQUIS Take 1 tablet (5 mg total) by mouth 2 (two) times daily.   atorvastatin 80 MG tablet Commonly known as: LIPITOR Take 1 tablet (80 mg total) by mouth daily. What changed:   medication strength  how much to take   carvedilol 6.25 MG tablet Commonly known as: COREG Take 1 tablet (6.25 mg total) by mouth 2 (two) times daily with a meal.   clopidogrel 75 MG tablet Commonly known as: PLAVIX Take 75 mg by mouth daily.   furosemide 40 MG tablet Commonly known as: LASIX Take 1 tablet (40  mg total) by mouth daily.   glimepiride 2 MG tablet Commonly known as: Amaryl Take 1 tablet (2  mg total) by mouth every morning.   hydrALAZINE 50 MG tablet Commonly known as: APRESOLINE Take 1 tablet (50 mg total) by mouth 3 (three) times daily. What changed: Another medication with the same name was removed. Continue taking this medication, and follow the directions you see here.   isosorbide mononitrate 30 MG 24 hr tablet Commonly known as: IMDUR Take 0.5 tablets (15 mg total) by mouth daily.   loratadine 10 MG tablet Commonly known as: CLARITIN Take 1 tablet (10 mg total) by mouth daily.   polyethylene glycol 17 g packet Commonly known as: MIRALAX / GLYCOLAX Take 17 g by mouth daily. What changed: how much to take       Follow-up Information    Verta Ellen., NP Follow up on 05/31/2020.   Specialty: Cardiology Why: Cardiology Hospital Follow-up on 05/31/2020 at 2:30 PM.  Contact information: [address] [place] [address] [phone]              No Known Allergies  Consultations:  Neurology  Cardiology  Pulmonology   Procedures/Studies: DG Chest 1 View  Result Date: 05/10/2020 CLINICAL DATA:  Chest pain and weakness. EXAM: CHEST  1 VIEW COMPARISON:  Single-view of the chest 05/05/2020. PA and lateral chest 03/15/2014. CT chest 05/08/2020. FINDINGS: There is cardiomegaly and vascular congestion. No consolidative process, pneumothorax or effusion. Aortic atherosclerosis noted. No acute or focal bony abnormality. IMPRESSION: Cardiomegaly and vascular congestion. Aortic Atherosclerosis (ICD10-I70.0). Electronically Signed   By: Inge Rise M.D.   On: 05/10/2020 13:58   DG Chest 2 View  Result Date: 05/03/2020 CLINICAL DATA:  Shortness of breath EXAM: CHEST - 2 VIEW COMPARISON:  03/15/2014 FINDINGS: Cardiac shadow is mildly prominent. Aortic calcifications are noted. Pleural calcifications are seen in the apices. No focal infiltrate or sizable effusion is seen. Degenerative changes of the thoracic spine are noted. Old left  clavicular fracture with healing is seen. IMPRESSION: No acute abnormality noted. Electronically Signed   By: Inez Catalina M.D.   On: 05/03/2020 13:33   CT Head Wo Contrast  Result Date: 05/01/2020 CLINICAL DATA:  Left side hemi anopsia, left arm paresthesias. EXAM: CT HEAD WITHOUT CONTRAST TECHNIQUE: Contiguous axial images were obtained from the base of the skull through the vertex without intravenous contrast. COMPARISON:  None. FINDINGS: Brain: Area of low-density noted in the right occipital lobe compatible with acute infarction. No other areas of infarct. Diffuse cerebral atrophy. No hydrocephalus. No hemorrhage. Vascular: No hyperdense vessel or unexpected calcification. Skull: No acute calvarial abnormality. Sinuses/Orbits: Visualized paranasal sinuses and mastoids clear. Orbital soft tissues unremarkable. Other: None IMPRESSION: Acute right occipital infarct. Electronically Signed   By: Rolm Baptise M.D.   On: 05/01/2020 17:24   CT ANGIO CHEST PE W OR WO CONTRAST  Addendum Date: 05/08/2020   ADDENDUM REPORT: 05/08/2020 17:52 ADDENDUM: These results were called by telephone at the time of interpretation on 05/08/2020 at 5:52 pm to provider [facility] , who verbally acknowledged these results. Electronically Signed   By: Zerita Boers M.D.   On: 05/08/2020 17:52   Result Date: 05/08/2020 CLINICAL DATA:  Concern for pulmonary embolism. EXAM: CT ANGIOGRAPHY CHEST WITH CONTRAST TECHNIQUE: Multidetector CT imaging of the chest was performed using the standard protocol during bolus administration of intravenous contrast. Multiplanar CT image reconstructions and MIPs were obtained to evaluate the vascular anatomy.  CONTRAST:  129mL OMNIPAQUE IOHEXOL 350 MG/ML SOLN COMPARISON:  Chest radiograph dated 05/05/2020. FINDINGS: Cardiovascular: Satisfactory opacification of the pulmonary arteries to the segmental level. A filling defect in a subsegmental pulmonary arteries supplying the right middle lobe is  consistent with an acute pulmonary embolism (series 5, images 150 8-162). Significant atherosclerotic calcifications are seen in the coronary arteries and aortic arch. The heart is enlarged. No pericardial effusion. Mediastinum/Nodes: There is mild bilateral hilar lymphadenopathy. No enlarged mediastinal or axillary lymph nodes. Thyroid gland, trachea, and esophagus demonstrate no significant findings. Lungs/Pleura: There are ground-glass opacities and consolidation in the left lower lobe near the lung base. There is mild bilateral dependent atelectasis. There is no pleural effusion or pneumothorax. Upper Abdomen: No acute abnormality. Musculoskeletal: There is a chronic appearing anterior wedge deformity of T12. Degenerative changes are seen in the spine. Review of the MIP images confirms the above findings. IMPRESSION: 1. Acute pulmonary embolism in a subsegmental pulmonary artery supplying the right middle lobe. 2. Ground-glass opacities and consolidation in the left lower lobe near the lung base likely represent pneumonia. 3. Mild bilateral hilar lymphadenopathy is likely reactive. Aortic Atherosclerosis (ICD10-I70.0). Electronically Signed: By: Zerita Boers M.D. On: 05/08/2020 17:47   MR ANGIO HEAD WO CONTRAST  Addendum Date: 05/02/2020   ADDENDUM REPORT: 05/02/2020 11:48 ADDENDUM: These results were called by telephone at the time of interpretation on 05/02/2020 at 11:47 am to provider Dr. Kathie Dike , who verbally acknowledged these results. Electronically Signed   By: Primitivo Gauze M.D.   On: 05/02/2020 11:48   Result Date: 05/02/2020 CLINICAL DATA:  Neuro deficit. EXAM: MR HEAD WITHOUT CONTRAST MR CIRCLE OF WILLIS WITHOUT CONTRAST MRA OF THE NECK WITHOUT CONTRAST TECHNIQUE: Multiplanar, multiecho pulse sequences of the brain, circle of willis and surrounding structures were obtained without intravenous contrast. Angiographic images of the neck were obtained using MRA technique without  intravenous contrast. COMPARISON:  05/01/2020 head CT. FINDINGS: MR HEAD FINDINGS Brain: Acute right occipital infarct. No intracranial hemorrhage. No midline shift, ventriculomegaly or extra-axial fluid collection. No mass lesion. Vascular: Please see MRA head. Skull and upper cervical spine: Normal marrow signal. Sinuses/Orbits: Normal orbits. Clear paranasal sinuses. Trace bilateral mastoid effusions. Other: None. MR CIRCLE OF WILLIS FINDINGS Image quality is degraded by motion artifact. Anterior circulation: Left A1 segment hypoplasia. Mild left A1 segment irregularity likely reflects atheromatous disease. Mild right and moderate to severe narrowing of the distal left ICA cervical segments. Vessel wall irregularity involving the petrous and intracavernous ICAs likely reflects atheromatous disease versus motion artifact. Short segment moderate to severe narrowing involving the proximal right and distal bilateral intra cavernous ICAs (1022:9). The bilateral intracavernous ICAs demonstrates short segment, mild pre-stenotic dilatation (1022: 8). No vascular malformation. Posterior circulation: Dominant left vertebral artery. Diminutive appearance of the right V4 segment after PICA takeoff. Atheromatous disease. No high-grade narrowing, occlusion or aneurysm involving the bilateral PCAs. Small focal outpouching involving the right PCA reflects an infundibulum. No significant stenosis, aneurysm, or vascular malformation. Venous sinuses: No evidence of thrombosis. Anatomic variants: Bilateral PCOM hypoplasia. MRA NECK FINDINGS Image quality is degraded by motion artifact. Three-vessel aortic arch. There is no high-grade narrowing or focal aneurysm involving the bilateral common or right internal carotid artery. Approximately 40% luminal narrowing of the proximal left ICA secondary to atheromatous disease. Diminutive appearance of the right vertebral artery. Proximal to the level of the glottis the right vertebral  artery is not visualized, concerning for proximal occlusion. The left vertebral artery is patent  and demonstrates antegrade flow. No evidence of high-grade narrowing or focal aneurysm involving left vertebral artery. Dominant left vertebral artery. IMPRESSION: 1. Acute right occipital infarct. 2. Nonvisualization of the proximal right vertebral artery inferior to the level of the glottis, concerning for occlusion. 3. Atheromatous disease with multifocal intracranial stenoses most notably is follows. 4. Short-segment moderate to severe proximal right and distal bilateral intracavernous ICA narrowing. Both of the intracavernous ICAs demonstrate mild short-segment pre stenotic dilatation. 5. Moderate to severe short segment narrowing of the distal left ICA cervical segment. 6. Approximately 40% luminal narrowing of the proximal left ICA, distal to the bifurcation. 7. Please note that motion artifact on MRA head and neck imaging limits evaluation. Electronically Signed: By: Primitivo Gauze M.D. On: 05/02/2020 11:41   MR ANGIO NECK WO CONTRAST  Addendum Date: 05/02/2020   ADDENDUM REPORT: 05/02/2020 11:48 ADDENDUM: These results were called by telephone at the time of interpretation on 05/02/2020 at 11:47 am to provider Dr. Kathie Dike , who verbally acknowledged these results. Electronically Signed   By: Primitivo Gauze M.D.   On: 05/02/2020 11:48   Result Date: 05/02/2020 CLINICAL DATA:  Neuro deficit. EXAM: MR HEAD WITHOUT CONTRAST MR CIRCLE OF WILLIS WITHOUT CONTRAST MRA OF THE NECK WITHOUT CONTRAST TECHNIQUE: Multiplanar, multiecho pulse sequences of the brain, circle of willis and surrounding structures were obtained without intravenous contrast. Angiographic images of the neck were obtained using MRA technique without intravenous contrast. COMPARISON:  05/01/2020 head CT. FINDINGS: MR HEAD FINDINGS Brain: Acute right occipital infarct. No intracranial hemorrhage. No midline shift, ventriculomegaly or  extra-axial fluid collection. No mass lesion. Vascular: Please see MRA head. Skull and upper cervical spine: Normal marrow signal. Sinuses/Orbits: Normal orbits. Clear paranasal sinuses. Trace bilateral mastoid effusions. Other: None. MR CIRCLE OF WILLIS FINDINGS Image quality is degraded by motion artifact. Anterior circulation: Left A1 segment hypoplasia. Mild left A1 segment irregularity likely reflects atheromatous disease. Mild right and moderate to severe narrowing of the distal left ICA cervical segments. Vessel wall irregularity involving the petrous and intracavernous ICAs likely reflects atheromatous disease versus motion artifact. Short segment moderate to severe narrowing involving the proximal right and distal bilateral intra cavernous ICAs (1022:9). The bilateral intracavernous ICAs demonstrates short segment, mild pre-stenotic dilatation (1022: 8). No vascular malformation. Posterior circulation: Dominant left vertebral artery. Diminutive appearance of the right V4 segment after PICA takeoff. Atheromatous disease. No high-grade narrowing, occlusion or aneurysm involving the bilateral PCAs. Small focal outpouching involving the right PCA reflects an infundibulum. No significant stenosis, aneurysm, or vascular malformation. Venous sinuses: No evidence of thrombosis. Anatomic variants: Bilateral PCOM hypoplasia. MRA NECK FINDINGS Image quality is degraded by motion artifact. Three-vessel aortic arch. There is no high-grade narrowing or focal aneurysm involving the bilateral common or right internal carotid artery. Approximately 40% luminal narrowing of the proximal left ICA secondary to atheromatous disease. Diminutive appearance of the right vertebral artery. Proximal to the level of the glottis the right vertebral artery is not visualized, concerning for proximal occlusion. The left vertebral artery is patent and demonstrates antegrade flow. No evidence of high-grade narrowing or focal aneurysm  involving left vertebral artery. Dominant left vertebral artery. IMPRESSION: 1. Acute right occipital infarct. 2. Nonvisualization of the proximal right vertebral artery inferior to the level of the glottis, concerning for occlusion. 3. Atheromatous disease with multifocal intracranial stenoses most notably is follows. 4. Short-segment moderate to severe proximal right and distal bilateral intracavernous ICA narrowing. Both of the intracavernous ICAs demonstrate mild short-segment pre  stenotic dilatation. 5. Moderate to severe short segment narrowing of the distal left ICA cervical segment. 6. Approximately 40% luminal narrowing of the proximal left ICA, distal to the bifurcation. 7. Please note that motion artifact on MRA head and neck imaging limits evaluation. Electronically Signed: By: Primitivo Gauze M.D. On: 05/02/2020 11:41   MR BRAIN WO CONTRAST  Addendum Date: 05/02/2020   ADDENDUM REPORT: 05/02/2020 11:48 ADDENDUM: These results were called by telephone at the time of interpretation on 05/02/2020 at 11:47 am to provider Dr. Kathie Dike , who verbally acknowledged these results. Electronically Signed   By: Primitivo Gauze M.D.   On: 05/02/2020 11:48   Result Date: 05/02/2020 CLINICAL DATA:  Neuro deficit. EXAM: MR HEAD WITHOUT CONTRAST MR CIRCLE OF WILLIS WITHOUT CONTRAST MRA OF THE NECK WITHOUT CONTRAST TECHNIQUE: Multiplanar, multiecho pulse sequences of the brain, circle of willis and surrounding structures were obtained without intravenous contrast. Angiographic images of the neck were obtained using MRA technique without intravenous contrast. COMPARISON:  05/01/2020 head CT. FINDINGS: MR HEAD FINDINGS Brain: Acute right occipital infarct. No intracranial hemorrhage. No midline shift, ventriculomegaly or extra-axial fluid collection. No mass lesion. Vascular: Please see MRA head. Skull and upper cervical spine: Normal marrow signal. Sinuses/Orbits: Normal orbits. Clear paranasal sinuses.  Trace bilateral mastoid effusions. Other: None. MR CIRCLE OF WILLIS FINDINGS Image quality is degraded by motion artifact. Anterior circulation: Left A1 segment hypoplasia. Mild left A1 segment irregularity likely reflects atheromatous disease. Mild right and moderate to severe narrowing of the distal left ICA cervical segments. Vessel wall irregularity involving the petrous and intracavernous ICAs likely reflects atheromatous disease versus motion artifact. Short segment moderate to severe narrowing involving the proximal right and distal bilateral intra cavernous ICAs (1022:9). The bilateral intracavernous ICAs demonstrates short segment, mild pre-stenotic dilatation (1022: 8). No vascular malformation. Posterior circulation: Dominant left vertebral artery. Diminutive appearance of the right V4 segment after PICA takeoff. Atheromatous disease. No high-grade narrowing, occlusion or aneurysm involving the bilateral PCAs. Small focal outpouching involving the right PCA reflects an infundibulum. No significant stenosis, aneurysm, or vascular malformation. Venous sinuses: No evidence of thrombosis. Anatomic variants: Bilateral PCOM hypoplasia. MRA NECK FINDINGS Image quality is degraded by motion artifact. Three-vessel aortic arch. There is no high-grade narrowing or focal aneurysm involving the bilateral common or right internal carotid artery. Approximately 40% luminal narrowing of the proximal left ICA secondary to atheromatous disease. Diminutive appearance of the right vertebral artery. Proximal to the level of the glottis the right vertebral artery is not visualized, concerning for proximal occlusion. The left vertebral artery is patent and demonstrates antegrade flow. No evidence of high-grade narrowing or focal aneurysm involving left vertebral artery. Dominant left vertebral artery. IMPRESSION: 1. Acute right occipital infarct. 2. Nonvisualization of the proximal right vertebral artery inferior to the level of  the glottis, concerning for occlusion. 3. Atheromatous disease with multifocal intracranial stenoses most notably is follows. 4. Short-segment moderate to severe proximal right and distal bilateral intracavernous ICA narrowing. Both of the intracavernous ICAs demonstrate mild short-segment pre stenotic dilatation. 5. Moderate to severe short segment narrowing of the distal left ICA cervical segment. 6. Approximately 40% luminal narrowing of the proximal left ICA, distal to the bifurcation. 7. Please note that motion artifact on MRA head and neck imaging limits evaluation. Electronically Signed: By: Primitivo Gauze M.D. On: 05/02/2020 11:41   US RENAL  Result Date: 05/06/2020 CLINICAL DATA:  Hematuria EXAM: RENAL ULTRASOUND COMPARISON:  CT abdomen and pelvis October 13, 2018  FINDINGS: Right Kidney: Renal measurements: 11.8 x 5.3 x 6.2 cm = volume: 200.4 mL . Echogenicity and renal cortical thickness are within normal limits. No mass, perinephric fluid, or hydronephrosis visualized. No sonographically demonstrable calculus or ureterectasis. Left Kidney: Renal measurements: 12.7 x 5.7 x 6.0 cm = volume: 226.0 mL. Echogenicity and renal cortical thickness are within normal limits. No mass, perinephric fluid, or hydronephrosis visualized. No sonographically demonstrable calculus or ureterectasis. Bladder: Unable to visualize with Foley catheter in place. Other: None. IMPRESSION: Normal appearing kidneys bilaterally. Unable to visualize urinary bladder with Foley catheter in place. Electronically Signed   By: Lowella Grip III M.D.   On: 05/06/2020 09:21   DG Chest Port 1 View  Result Date: 05/13/2020 CLINICAL DATA:  Increased shortness of breath EXAM: PORTABLE CHEST 1 VIEW COMPARISON:  Three days ago FINDINGS: Cardiomegaly and vascular pedicle widening. Diffuse interstitial opacity with cephalized flow. No definite or large effusion. No pneumothorax. Healed left mid clavicle fracture. IMPRESSION: CHF  pattern. Electronically Signed   By: Monte Fantasia M.D.   On: 05/13/2020 09:31   DG Chest Port 1 View  Result Date: 05/05/2020 CLINICAL DATA:  Respiratory failure, intubated EXAM: PORTABLE CHEST 1 VIEW COMPARISON:  Chest radiograph from one day prior. FINDINGS: Endotracheal tube tip is 2.9 cm above the carina. Enteric tube enters stomach with the tip not seen on this image. Stable cardiomediastinal silhouette with mild cardiomegaly. No pneumothorax. Probable small bilateral pleural effusions, slightly increased on the right. Mild diffuse prominence of the parahilar interstitial markings is similar. Mildly increased right basilar atelectasis. Stable left basilar atelectasis. IMPRESSION: 1. Well-positioned support structures. 2. Stable mild cardiomegaly and mild pulmonary edema. 3. Probable small bilateral pleural effusions, slightly increased on the right. 4. Mildly increased right basilar atelectasis. Electronically Signed   By: Ilona Sorrel M.D.   On: 05/05/2020 09:38   DG CHEST PORT 1 VIEW  Result Date: 05/04/2020 CLINICAL DATA:  Respiratory failure. EXAM: PORTABLE CHEST 1 VIEW COMPARISON:  05/03/2020 FINDINGS: The endotracheal tube is 3.2 cm above the carina. The NG tube is coursing down the esophagus and into the stomach. The heart is enlarged but stable. Stable tortuosity and calcification of the thoracic aorta. Increased density at the left lung base could suggest atelectasis or infiltrate. Streaky right basilar atelectasis also noted. No pulmonary edema or pneumothorax. IMPRESSION: 1. Stable support apparatus. 2. Persistent increased left basilar density suggesting atelectasis or infiltrate. 3. Minimal right basilar atelectasis. Electronically Signed   By: Marijo Sanes M.D.   On: 05/04/2020 07:24   DG CHEST PORT 1 VIEW  Result Date: 05/03/2020 CLINICAL DATA:  Shortness of breath, intubated EXAM: PORTABLE CHEST 1 VIEW COMPARISON:  05/03/2020 FINDINGS: Endotracheal tube is 4 cm above the carina.  NG tube enters the stomach. Bilateral perihilar airspace opacities and interstitial prominence concerning for edema. Heart is borderline in size. No visible effusions or acute bony abnormality. IMPRESSION: Bilateral perihilar opacities and diffuse interstitial prominence, likely edema/CHF. Electronically Signed   By: Rolm Baptise M.D.   On: 05/03/2020 21:29   ECHOCARDIOGRAM COMPLETE  Result Date: 05/02/2020    ECHOCARDIOGRAM REPORT   Patient Name:   HENDRICK PAVICH Date of Exam: 05/02/2020 Medical Rec #:  [id number]      Height:       68.0 in Accession #:    [id number]     Weight:       228.0 lb Date of Birth:  01/15/1946      BSA:  2.161 m Patient Age:    74 years       BP:           163/54 mmHg Patient Gender: M              HR:           66 bpm. Exam Location:  [facility] Procedure: 2D Echo, Cardiac Doppler and Color Doppler Indications:    CVA  History:        Patient has no prior history of Echocardiogram examinations.                 Signs/Symptoms:Murmur; Risk Factors:Hypertension, Dyslipidemia                 and Diabetes. Vision loss.  Sonographer:    Dustin Flock RDCS Referring Phys: [place]  1. Left ventricular ejection fraction, by estimation, is 60 to 65%. The left ventricle has normal function. Left ventricular endocardial border not optimally defined to evaluate regional wall motion. The left ventricular internal cavity size was mildly dilated. There is mild left ventricular hypertrophy. Left ventricular diastolic parameters are indeterminate.  2. Right ventricular systolic function is normal. The right ventricular size is normal.  3. The mitral valve is abnormal. Trivial mitral valve regurgitation.  4. AV is thickened, calcified with mildly restricted motion. Peak and mean gradients through the valve are 31 and 18 mm Hg respectively. . The aortic valve is abnormal. Aortic valve regurgitation is not visualized. FINDINGS  Left Ventricle: Left ventricular  ejection fraction, by estimation, is 60 to 65%. The left ventricle has normal function. Left ventricular endocardial border not optimally defined to evaluate regional wall motion. The left ventricular internal cavity size was mildly dilated. There is mild left ventricular hypertrophy. Left ventricular diastolic parameters are indeterminate. Right Ventricle: The right ventricular size is normal. Right vetricular wall thickness was not assessed. Right ventricular systolic function is normal. Left Atrium: Left atrial size was normal in size. Right Atrium: Right atrial size was normal in size. Pericardium: There is no evidence of pericardial effusion. Mitral Valve: The mitral valve is abnormal. There is mild thickening of the mitral valve leaflet(s). Trivial mitral valve regurgitation. Tricuspid Valve: The tricuspid valve is grossly normal. Tricuspid valve regurgitation is not demonstrated. Aortic Valve: AV is thickened, calcified with mildly restricted motion. Peak and mean gradients through the valve are 31 and 18 mm Hg respectively. The aortic valve is abnormal. Aortic valve regurgitation is not visualized. Aortic valve mean gradient measures 17.5 mmHg. Aortic valve peak gradient measures 28.2 mmHg. Aortic valve area, by VTI measures 2.55 cm. Pulmonic Valve: The pulmonic valve was not well visualized. Pulmonic valve regurgitation is not visualized. No evidence of pulmonic stenosis. Aorta: The aortic root is normal in size and structure. IAS/Shunts: The interatrial septum was not assessed.  LEFT VENTRICLE PLAX 2D LVIDd:         5.52 cm  Diastology LVIDs:         4.31 cm  LV e' lateral:   13.40 cm/s LV PW:         1.58 cm  LV E/e' lateral: 7.5 LV IVS:        1.24 cm  LV e' medial:    6.09 cm/s LVOT diam:     3.00 cm  LV E/e' medial:  16.4 LV SV:         175 LV SV Index:   81 LVOT Area:     7.07  cm  RIGHT VENTRICLE RV Basal diam:  3.16 cm RV S prime:     12.70 cm/s TAPSE (M-mode): 2.8 cm LEFT ATRIUM             Index        RIGHT ATRIUM           Index LA diam:        5.20 cm 2.41 cm/m  RA Area:     15.30 cm LA Vol (A2C):   38.6 ml 17.86 ml/m RA Volume:   36.50 ml  16.89 ml/m LA Vol (A4C):   52.3 ml 24.20 ml/m LA Biplane Vol: 44.7 ml 20.69 ml/m  AORTIC VALVE AV Area (Vmax):    2.63 cm AV Area (Vmean):   2.51 cm AV Area (VTI):     2.55 cm AV Vmax:           265.67 cm/s AV Vmean:          200.500 cm/s AV VTI:            0.686 m AV Peak Grad:      28.2 mmHg AV Mean Grad:      17.5 mmHg LVOT Vmax:         99.00 cm/s LVOT Vmean:        71.300 cm/s LVOT VTI:          0.247 m LVOT/AV VTI ratio: 0.36  AORTA Ao Root diam: 4.00 cm MITRAL VALVE MV Area (PHT): 2.84 cm    SHUNTS MV Decel Time: 267 msec    Systemic VTI:  0.25 m MV E velocity: 99.90 cm/s  Systemic Diam: 3.00 cm MV A velocity: 94.60 cm/s MV E/A ratio:  1.06 Dorris Carnes MD Electronically signed by Dorris Carnes MD Signature Date/Time: 05/02/2020/5:25:05 PM    Final    ECHOCARDIOGRAM LIMITED  Result Date: 05/04/2020    ECHOCARDIOGRAM LIMITED REPORT   Patient Name:   SHADOW SCHEDLER Date of Exam: 05/04/2020 Medical Rec #:  [id number]      Height:       68.0 in Accession #:    [id number]     Weight:       228.0 lb Date of Birth:  06-Jun-1946      BSA:          2.161 m Patient Age:    70 years       BP:           126/53 mmHg Patient Gender: M              HR:           108 bpm. Exam Location:  [facility] Procedure: Limited Echo and Intracardiac Opacification Agent Indications:    Elevated Troponin  History:        Patient has prior history of Echocardiogram examinations, most                 recent 05/02/2020. Stroke; Risk Factors:Diabetes, Hypertension,                 Dyslipidemia and Former Smoker. Elevated Troponin.  Sonographer:    Leavy Cella RDCS (AE) Referring Phys: [id number] [place]  1. The entire apex from mid to distal ventricle is akinetic. Pattern can be consistent with stress induced cardiomyopathy/Takotsubo CM, however cannot exclude ischemic  etiology     . Left ventricular ejection fraction, by estimation, is 35%. The left ventricle has moderately decreased function.  2. The inferior  vena cava is normal in size with greater than 50% respiratory variability, suggesting right atrial pressure of 3 mmHg. FINDINGS  Left Ventricle: The entire apex from mid to distal ventricle is akinetic. Pattern can be consistent with stress induced cardiomyopathy/Takotsubo CM, however cannot exclude ischemic etiology. Left ventricular ejection fraction, by estimation, is 35%. The left ventricle has moderately decreased function. Definity contrast agent was given IV to delineate the left ventricular endocardial borders. Pericardium: There is no evidence of pericardial effusion. Aortic Valve: Aortic valve mean gradient measures 12.0 mmHg. Aortic valve peak gradient measures 22.8 mmHg. Venous: The inferior vena cava is normal in size with greater than 50% respiratory variability, suggesting right atrial pressure of 3 mmHg. LEFT VENTRICLE PLAX 2D LVIDd:         5.61 cm Diastology LVIDs:         4.50 cm LV e' lateral:   6.74 cm/s LV PW:         1.33 cm LV E/e' lateral: 20.0 LV IVS:        1.37 cm LV e' medial:    8.70 cm/s                        LV E/e' medial:  15.5  LEFT ATRIUM         Index LA diam:    4.50 cm 2.08 cm/m  AORTIC VALVE AV Vmax:           239.00 cm/s AV Vmean:          161.000 cm/s AV VTI:            0.382 m AV Peak Grad:      22.8 mmHg AV Mean Grad:      12.0 mmHg LVOT Vmax:         98.20 cm/s LVOT Vmean:        59.400 cm/s LVOT VTI:          0.152 m LVOT/AV VTI ratio: 0.40  AORTA Ao Root diam: 3.20 cm MITRAL VALVE MV Area (PHT): 7.37 cm     SHUNTS MV Decel Time: 103 msec     Systemic VTI: 0.15 m MV E velocity: 135.00 cm/s Carlyle Dolly MD Electronically signed by Carlyle Dolly MD Signature Date/Time: 05/04/2020/4:05:41 PM    Final        Subjective: Shortness of breath has improved.  No chest pain.  Discharge Exam: Vitals:   05/17/20 0300  05/17/20 0400 05/17/20 0700 05/17/20 0750  BP: (!) 147/50  (!) 169/41   Pulse: 64  (!) 59   Resp: 19  13   Temp:  98.5 F (36.9 C)  98.3 F (36.8 C)  TempSrc:  Oral  Oral  SpO2: (!) 87%  96%   Weight:      Height:        General: Pt is alert, awake, not in acute distress Cardiovascular: RRR, S1/S2 +, no rubs, no gallops Respiratory: CTA bilaterally, no wheezing, no rhonchi Abdominal: Soft, NT, ND, bowel sounds + Extremities: no edema, no cyanosis    The results of significant diagnostics from this hospitalization (including imaging, microbiology, ancillary and laboratory) are listed below for reference.     Microbiology: No results found for this or any previous visit (from the past 240 hour(s)).   Labs: BNP (last 3 results) Recent Labs    05/03/20 2133 05/05/20 0319 05/07/20 0653  BNP 1,082.0* 764.0* 998.3*   Basic Metabolic Panel: Recent Labs  Lab  05/11/20 0439 05/11/20 0439 05/12/20 0422 05/12/20 0422 05/13/20 0500 05/14/20 0828 05/15/20 0451 05/16/20 0420 05/17/20 0634  NA 138   < > 141   < > 141 140 139 139 141  K 3.0*   < > 4.1   < > 4.0 3.8 3.5 3.5 3.6  CL 105   < > 107   < > 109 106 102 104 103  CO2 23   < > 22   < > 22 24 25 25 26   GLUCOSE 136*   < > 136*   < > 150* 140* 131* 122* 154*  BUN 38*   < > 38*   < > 30* 31* 31* 28* 28*  CREATININE 2.27*   < > 2.19*   < > 1.77* 1.95* 1.85* 1.70* 1.78*  CALCIUM 8.3*   < > 9.0   < > 8.9 8.9 9.2 8.7* 8.8*  MG 1.8  --   --   --   --   --   --   --   --   PHOS  --   --  3.2  --  3.3  --   --   --   --    < > = values in this interval not displayed.   Liver Function Tests: Recent Labs  Lab 05/12/20 0422 05/13/20 0500  ALBUMIN 2.8* 2.9*   No results for input(s): LIPASE, AMYLASE in the last 168 hours. No results for input(s): AMMONIA in the last 168 hours. CBC: Recent Labs  Lab 05/13/20 0553 05/14/20 0828 05/15/20 0451 05/16/20 0420 05/17/20 0634  WBC 5.4 6.0 8.0 6.7 5.9  HGB 9.0* 9.3* 9.2*  8.4* 9.2*  HCT 28.6* 30.2* 29.6* 27.3* 29.5*  MCV 94.4 95.0 94.3 93.5 93.1  PLT 222 233 234 210 216   Cardiac Enzymes: No results for input(s): CKTOTAL, CKMB, CKMBINDEX, TROPONINI in the last 168 hours. BNP: Invalid input(s): POCBNP CBG: Recent Labs  Lab 05/16/20 0742 05/16/20 1113 05/16/20 1622 05/16/20 2116 05/17/20 0752  GLUCAP 117* 164* 117* 185* 140*   D-Dimer No results for input(s): DDIMER in the last 72 hours. Hgb A1c No results for input(s): HGBA1C in the last 72 hours. Lipid Profile Recent Labs    05/16/20 0420  TRIG 71   Thyroid function studies No results for input(s): TSH, T4TOTAL, T3FREE, THYROIDAB in the last 72 hours.  Invalid input(s): FREET3 Anemia work up No results for input(s): VITAMINB12, FOLATE, FERRITIN, TIBC, IRON, RETICCTPCT in the last 72 hours. Urinalysis    Component Value Date/Time   COLORURINE RED (A) 05/04/2020 1555   APPEARANCEUR TURBID (A) 05/04/2020 1555   LABSPEC  05/04/2020 1555    TEST NOT REPORTED DUE TO COLOR INTERFERENCE OF URINE PIGMENT   PHURINE  05/04/2020 1555    TEST NOT REPORTED DUE TO COLOR INTERFERENCE OF URINE PIGMENT   GLUCOSEU (A) 05/04/2020 1555    TEST NOT REPORTED DUE TO COLOR INTERFERENCE OF URINE PIGMENT   HGBUR (A) 05/04/2020 1555    TEST NOT REPORTED DUE TO COLOR INTERFERENCE OF URINE PIGMENT   BILIRUBINUR (A) 05/04/2020 1555    TEST NOT REPORTED DUE TO COLOR INTERFERENCE OF URINE PIGMENT   KETONESUR (A) 05/04/2020 1555    TEST NOT REPORTED DUE TO COLOR INTERFERENCE OF URINE PIGMENT   PROTEINUR (A) 05/04/2020 1555    TEST NOT REPORTED DUE TO COLOR INTERFERENCE OF URINE PIGMENT   UROBILINOGEN 1.0 03/04/2014 1358   NITRITE (A) 05/04/2020 1555    TEST NOT REPORTED  DUE TO COLOR INTERFERENCE OF URINE PIGMENT   LEUKOCYTESUR (A) 05/04/2020 1555    TEST NOT REPORTED DUE TO COLOR INTERFERENCE OF URINE PIGMENT   Sepsis Labs Invalid input(s): PROCALCITONIN,  WBC,  LACTICIDVEN Microbiology No results found  for this or any previous visit (from the past 240 hour(s)).   Time coordinating discharge: 27mins  SIGNED:   Kathie Dike, MD  [facility] 05/17/2020, 8:41 PM   If 7PM-7AM, please contact night-coverage [email or web address]

## 2020-05-18 ENCOUNTER — Other Ambulatory Visit: Payer: Self-pay | Admitting: *Deleted

## 2020-05-18 ENCOUNTER — Telehealth: Payer: Self-pay | Admitting: *Deleted

## 2020-05-18 DIAGNOSIS — M109 Gout, unspecified: Secondary | ICD-10-CM | POA: Diagnosis not present

## 2020-05-18 DIAGNOSIS — I10 Essential (primary) hypertension: Secondary | ICD-10-CM

## 2020-05-18 DIAGNOSIS — I69398 Other sequelae of cerebral infarction: Secondary | ICD-10-CM | POA: Diagnosis not present

## 2020-05-18 DIAGNOSIS — I5021 Acute systolic (congestive) heart failure: Secondary | ICD-10-CM | POA: Diagnosis not present

## 2020-05-18 DIAGNOSIS — I130 Hypertensive heart and chronic kidney disease with heart failure and stage 1 through stage 4 chronic kidney disease, or unspecified chronic kidney disease: Secondary | ICD-10-CM | POA: Diagnosis not present

## 2020-05-18 DIAGNOSIS — H547 Unspecified visual loss: Secondary | ICD-10-CM | POA: Diagnosis not present

## 2020-05-18 DIAGNOSIS — D631 Anemia in chronic kidney disease: Secondary | ICD-10-CM | POA: Diagnosis not present

## 2020-05-18 DIAGNOSIS — E1151 Type 2 diabetes mellitus with diabetic peripheral angiopathy without gangrene: Secondary | ICD-10-CM | POA: Diagnosis not present

## 2020-05-18 DIAGNOSIS — J449 Chronic obstructive pulmonary disease, unspecified: Secondary | ICD-10-CM | POA: Diagnosis not present

## 2020-05-18 DIAGNOSIS — I2699 Other pulmonary embolism without acute cor pulmonale: Secondary | ICD-10-CM | POA: Diagnosis not present

## 2020-05-18 DIAGNOSIS — N183 Chronic kidney disease, stage 3 unspecified: Secondary | ICD-10-CM | POA: Diagnosis not present

## 2020-05-18 DIAGNOSIS — Z09 Encounter for follow-up examination after completed treatment for conditions other than malignant neoplasm: Secondary | ICD-10-CM

## 2020-05-18 DIAGNOSIS — I480 Paroxysmal atrial fibrillation: Secondary | ICD-10-CM | POA: Diagnosis not present

## 2020-05-18 DIAGNOSIS — E1122 Type 2 diabetes mellitus with diabetic chronic kidney disease: Secondary | ICD-10-CM | POA: Diagnosis not present

## 2020-05-18 DIAGNOSIS — D509 Iron deficiency anemia, unspecified: Secondary | ICD-10-CM | POA: Diagnosis not present

## 2020-05-18 MED ORDER — HYDRALAZINE HCL 50 MG PO TABS: 50.0000 mg | ORAL_TABLET | Freq: Three times a day (TID) | ORAL | 0 refills | Status: DC

## 2020-05-18 NOTE — Progress Notes (Signed)
Medication was not sent in from hospital. He only has hydralazine 100mg  at home and it is very hard to split.

## 2020-05-18 NOTE — Telephone Encounter (Signed)
TRANSITIONAL CARE MANAGEMENT TELEPHONE OUTREACH NOTE   Contact Date: 05/18/2020 Contacted By: Faylene Million   DISCHARGE INFORMATION Date of Discharge:05/17/20 Discharge Facility: [facility] Principal Discharge Diagnosis:Cerebral Infarction  Outpatient Follow Up Recommendations (copied from discharge summary) Recommendations for Outpatient Follow-up:  1. Follow up with PCP in 1-2 weeks 2. Please obtain BMP/CBC in one week 3. Follow-up with cardiology as an outpatient 4. Follow-up with vascular surgeon as previously scheduled  Connor Bryant is a male primary care patient of Stacks, Cletus Gash, MD. An outgoing telephone call was made today and I spoke with Connor Bryant..  Connor Bryant condition(s) and treatment(s) were discussed. An opportunity to ask questions was provided and all were answered or forwarded as appropriate.    ACTIVITIES OF DAILY LIVING  Connor Bryant lives with their spouse and he can perform ADLs independently. his primary caregiver is his son.. he is able to depend on his primary caregiver(s) for consistent help. Transportation to appointments, to pick up medications, and to run errands is not a problem.  (Consider referral to [place] if transportation or a consistent caregiver is a problem)   Fall Risk Fall Risk  02/29/2020 08/27/2019  Falls in the past year? 0 0  Follow up Falls evaluation completed -    medium [place] Modifications/Assistive Devices Wheelchair: No Cane: Yes Ramp: No Bedside Toilet: No Hospital Bed:  No Other: 4 wheel walker used mostly   [place] he is not receiving home health  services.     MEDICATION RECONCILIATION  Connor Bryant has been able to pick-up all prescribed discharge medications from the pharmacy.   A post discharge medication reconciliation was performed and the complete medication list was reviewed with the patient/caregiver and is current as of 05/18/2020. Changes highlighted below.  Discontinued  Medications STOP taking these medications   aspirin 81 MG tablet   hydrochlorothiazide 25 MG tablet Commonly known as: HYDRODIURIL   metFORMIN 500 MG 24 hr tablet Commonly known as: GLUCOPHAGE-XR   metoprolol 200 MG 24 hr tablet Commonly known as: TOPROL-XL   pioglitazone 30 MG tablet Commonly known as: ACTOS   trandolapril 4 MG tablet Commonly known as: MAVIK   verapamil 240 MG CR tablet Commonly known as: CALAN-SR        Current Medication List Allergies as of 05/18/2020   No Known Allergies     Medication List       Accurate as of May 18, 2020 10:25 AM. If you have any questions, ask your nurse or doctor.        allopurinol 100 MG tablet Commonly known as: ZYLOPRIM TAKE 1 TABLET 2 TO 3 TIMES A DAY FOR GOUT   apixaban 5 MG Tabs tablet Commonly known as: ELIQUIS Take 1 tablet (5 mg total) by mouth 2 (two) times daily.   atorvastatin 80 MG tablet Commonly known as: LIPITOR Take 1 tablet (80 mg total) by mouth daily.   carvedilol 6.25 MG tablet Commonly known as: COREG Take 1 tablet (6.25 mg total) by mouth 2 (two) times daily with a meal.   clopidogrel 75 MG tablet Commonly known as: PLAVIX Take 75 mg by mouth daily.   furosemide 40 MG tablet Commonly known as: LASIX Take 1 tablet (40 mg total) by mouth daily.   glimepiride 2 MG tablet Commonly known as: Amaryl Take 1 tablet (2 mg total) by mouth every morning.   hydrALAZINE 50 MG tablet Commonly known as: APRESOLINE Take 1 tablet (  50 mg total) by mouth 3 (three) times daily.   isosorbide mononitrate 30 MG 24 hr tablet Commonly known as: IMDUR Take 0.5 tablets (15 mg total) by mouth daily.   loratadine 10 MG tablet Commonly known as: CLARITIN Take 1 tablet (10 mg total) by mouth daily.   polyethylene glycol 17 g packet Commonly known as: MIRALAX / GLYCOLAX Take 17 g by mouth daily. What changed: how much to take        [place]  An appointment  for Transitional Care Management is scheduled with Claretta Fraise, MD on 05/27/20 at 9:10 am  Take all medications as prescribed  Contact our office by calling [phone] if you have any questions or concerns  Lab appt also given for 05/23/20 at 10:00am for CBC/BMP

## 2020-05-19 DIAGNOSIS — N183 Chronic kidney disease, stage 3 unspecified: Secondary | ICD-10-CM | POA: Diagnosis not present

## 2020-05-19 DIAGNOSIS — I5021 Acute systolic (congestive) heart failure: Secondary | ICD-10-CM | POA: Diagnosis not present

## 2020-05-19 DIAGNOSIS — E1151 Type 2 diabetes mellitus with diabetic peripheral angiopathy without gangrene: Secondary | ICD-10-CM | POA: Diagnosis not present

## 2020-05-19 DIAGNOSIS — I2699 Other pulmonary embolism without acute cor pulmonale: Secondary | ICD-10-CM | POA: Diagnosis not present

## 2020-05-19 DIAGNOSIS — D509 Iron deficiency anemia, unspecified: Secondary | ICD-10-CM | POA: Diagnosis not present

## 2020-05-19 DIAGNOSIS — H547 Unspecified visual loss: Secondary | ICD-10-CM | POA: Diagnosis not present

## 2020-05-19 DIAGNOSIS — I130 Hypertensive heart and chronic kidney disease with heart failure and stage 1 through stage 4 chronic kidney disease, or unspecified chronic kidney disease: Secondary | ICD-10-CM | POA: Diagnosis not present

## 2020-05-19 DIAGNOSIS — J449 Chronic obstructive pulmonary disease, unspecified: Secondary | ICD-10-CM | POA: Diagnosis not present

## 2020-05-19 DIAGNOSIS — M109 Gout, unspecified: Secondary | ICD-10-CM | POA: Diagnosis not present

## 2020-05-19 DIAGNOSIS — E1122 Type 2 diabetes mellitus with diabetic chronic kidney disease: Secondary | ICD-10-CM | POA: Diagnosis not present

## 2020-05-19 DIAGNOSIS — I69398 Other sequelae of cerebral infarction: Secondary | ICD-10-CM | POA: Diagnosis not present

## 2020-05-19 DIAGNOSIS — I480 Paroxysmal atrial fibrillation: Secondary | ICD-10-CM | POA: Diagnosis not present

## 2020-05-19 DIAGNOSIS — D631 Anemia in chronic kidney disease: Secondary | ICD-10-CM | POA: Diagnosis not present

## 2020-05-21 DIAGNOSIS — I69398 Other sequelae of cerebral infarction: Secondary | ICD-10-CM | POA: Diagnosis not present

## 2020-05-21 DIAGNOSIS — D509 Iron deficiency anemia, unspecified: Secondary | ICD-10-CM | POA: Diagnosis not present

## 2020-05-21 DIAGNOSIS — H547 Unspecified visual loss: Secondary | ICD-10-CM | POA: Diagnosis not present

## 2020-05-21 DIAGNOSIS — E1151 Type 2 diabetes mellitus with diabetic peripheral angiopathy without gangrene: Secondary | ICD-10-CM | POA: Diagnosis not present

## 2020-05-21 DIAGNOSIS — I2699 Other pulmonary embolism without acute cor pulmonale: Secondary | ICD-10-CM | POA: Diagnosis not present

## 2020-05-21 DIAGNOSIS — D631 Anemia in chronic kidney disease: Secondary | ICD-10-CM | POA: Diagnosis not present

## 2020-05-21 DIAGNOSIS — I130 Hypertensive heart and chronic kidney disease with heart failure and stage 1 through stage 4 chronic kidney disease, or unspecified chronic kidney disease: Secondary | ICD-10-CM | POA: Diagnosis not present

## 2020-05-21 DIAGNOSIS — J449 Chronic obstructive pulmonary disease, unspecified: Secondary | ICD-10-CM | POA: Diagnosis not present

## 2020-05-21 DIAGNOSIS — N183 Chronic kidney disease, stage 3 unspecified: Secondary | ICD-10-CM | POA: Diagnosis not present

## 2020-05-21 DIAGNOSIS — I5021 Acute systolic (congestive) heart failure: Secondary | ICD-10-CM | POA: Diagnosis not present

## 2020-05-21 DIAGNOSIS — I480 Paroxysmal atrial fibrillation: Secondary | ICD-10-CM | POA: Diagnosis not present

## 2020-05-21 DIAGNOSIS — E1122 Type 2 diabetes mellitus with diabetic chronic kidney disease: Secondary | ICD-10-CM | POA: Diagnosis not present

## 2020-05-21 DIAGNOSIS — M109 Gout, unspecified: Secondary | ICD-10-CM | POA: Diagnosis not present

## 2020-05-23 ENCOUNTER — Other Ambulatory Visit: Payer: Medicare Other

## 2020-05-23 ENCOUNTER — Other Ambulatory Visit: Payer: Self-pay

## 2020-05-23 DIAGNOSIS — E1122 Type 2 diabetes mellitus with diabetic chronic kidney disease: Secondary | ICD-10-CM

## 2020-05-23 DIAGNOSIS — I10 Essential (primary) hypertension: Secondary | ICD-10-CM | POA: Diagnosis not present

## 2020-05-23 LAB — CBC WITH DIFFERENTIAL/PLATELET
Basophils Absolute: 0 10*3/uL (ref 0.0–0.2)
Basos: 1 %
EOS (ABSOLUTE): 0.3 10*3/uL (ref 0.0–0.4)
Eos: 5 %
Hematocrit: 30.2 % — ABNORMAL LOW (ref 37.5–51.0)
Hemoglobin: 9.6 g/dL — ABNORMAL LOW (ref 13.0–17.7)
Immature Grans (Abs): 0 10*3/uL (ref 0.0–0.1)
Immature Granulocytes: 0 %
Lymphocytes Absolute: 1.4 10*3/uL (ref 0.7–3.1)
Lymphs: 24 %
MCH: 28.6 pg (ref 26.6–33.0)
MCHC: 31.8 g/dL (ref 31.5–35.7)
MCV: 90 fL (ref 79–97)
Monocytes Absolute: 0.6 10*3/uL (ref 0.1–0.9)
Monocytes: 11 %
Neutrophils Absolute: 3.3 10*3/uL (ref 1.4–7.0)
Neutrophils: 59 %
Platelets: 252 10*3/uL (ref 150–450)
RBC: 3.36 x10E6/uL — ABNORMAL LOW (ref 4.14–5.80)
RDW: 15.5 % — ABNORMAL HIGH (ref 11.6–15.4)
WBC: 5.7 10*3/uL (ref 3.4–10.8)

## 2020-05-23 LAB — LIPID PANEL
Chol/HDL Ratio: 2.6 ratio (ref 0.0–5.0)
Cholesterol, Total: 137 mg/dL (ref 100–199)
HDL: 53 mg/dL (ref >39)
LDL Chol Calc (NIH): 65 mg/dL (ref 0–99)
Triglycerides: 103 mg/dL (ref 0–149)
VLDL Cholesterol Cal: 19 mg/dL (ref 5–40)

## 2020-05-23 LAB — CMP14+EGFR
ALT: 18 IU/L (ref 0–44)
AST: 22 IU/L (ref 0–40)
Albumin/Globulin Ratio: 1.4 (ref 1.2–2.2)
Albumin: 3.9 g/dL (ref 3.7–4.7)
Alkaline Phosphatase: 94 IU/L (ref 48–121)
BUN/Creatinine Ratio: 11 (ref 10–24)
BUN: 22 mg/dL (ref 8–27)
Bilirubin Total: 0.8 mg/dL (ref 0.0–1.2)
CO2: 22 mmol/L (ref 20–29)
Calcium: 9.1 mg/dL (ref 8.6–10.2)
Chloride: 102 mmol/L (ref 96–106)
Creatinine, Ser: 2.04 mg/dL — ABNORMAL HIGH (ref 0.76–1.27)
GFR calc Af Amer: 36 mL/min/{1.73_m2} — ABNORMAL LOW (ref >59)
GFR calc non Af Amer: 31 mL/min/{1.73_m2} — ABNORMAL LOW (ref >59)
Globulin, Total: 2.7 g/dL (ref 1.5–4.5)
Glucose: 127 mg/dL — ABNORMAL HIGH (ref 65–99)
Potassium: 3.7 mmol/L (ref 3.5–5.2)
Sodium: 142 mmol/L (ref 134–144)
Total Protein: 6.6 g/dL (ref 6.0–8.5)

## 2020-05-23 LAB — BAYER DCA HB A1C WAIVED: HB A1C (BAYER DCA - WAIVED): 6 % (ref <7.0)

## 2020-05-24 DIAGNOSIS — I69398 Other sequelae of cerebral infarction: Secondary | ICD-10-CM | POA: Diagnosis not present

## 2020-05-24 DIAGNOSIS — E1151 Type 2 diabetes mellitus with diabetic peripheral angiopathy without gangrene: Secondary | ICD-10-CM | POA: Diagnosis not present

## 2020-05-24 DIAGNOSIS — I480 Paroxysmal atrial fibrillation: Secondary | ICD-10-CM | POA: Diagnosis not present

## 2020-05-24 DIAGNOSIS — E1122 Type 2 diabetes mellitus with diabetic chronic kidney disease: Secondary | ICD-10-CM | POA: Diagnosis not present

## 2020-05-24 DIAGNOSIS — H547 Unspecified visual loss: Secondary | ICD-10-CM | POA: Diagnosis not present

## 2020-05-24 DIAGNOSIS — D509 Iron deficiency anemia, unspecified: Secondary | ICD-10-CM | POA: Diagnosis not present

## 2020-05-24 DIAGNOSIS — J449 Chronic obstructive pulmonary disease, unspecified: Secondary | ICD-10-CM | POA: Diagnosis not present

## 2020-05-24 DIAGNOSIS — I5021 Acute systolic (congestive) heart failure: Secondary | ICD-10-CM | POA: Diagnosis not present

## 2020-05-24 DIAGNOSIS — I2699 Other pulmonary embolism without acute cor pulmonale: Secondary | ICD-10-CM | POA: Diagnosis not present

## 2020-05-24 DIAGNOSIS — M109 Gout, unspecified: Secondary | ICD-10-CM | POA: Diagnosis not present

## 2020-05-24 DIAGNOSIS — D631 Anemia in chronic kidney disease: Secondary | ICD-10-CM | POA: Diagnosis not present

## 2020-05-24 DIAGNOSIS — I130 Hypertensive heart and chronic kidney disease with heart failure and stage 1 through stage 4 chronic kidney disease, or unspecified chronic kidney disease: Secondary | ICD-10-CM | POA: Diagnosis not present

## 2020-05-24 DIAGNOSIS — N183 Chronic kidney disease, stage 3 unspecified: Secondary | ICD-10-CM | POA: Diagnosis not present

## 2020-05-25 DIAGNOSIS — I69398 Other sequelae of cerebral infarction: Secondary | ICD-10-CM | POA: Diagnosis not present

## 2020-05-25 DIAGNOSIS — N183 Chronic kidney disease, stage 3 unspecified: Secondary | ICD-10-CM | POA: Diagnosis not present

## 2020-05-25 DIAGNOSIS — E1151 Type 2 diabetes mellitus with diabetic peripheral angiopathy without gangrene: Secondary | ICD-10-CM | POA: Diagnosis not present

## 2020-05-25 DIAGNOSIS — M109 Gout, unspecified: Secondary | ICD-10-CM | POA: Diagnosis not present

## 2020-05-25 DIAGNOSIS — J449 Chronic obstructive pulmonary disease, unspecified: Secondary | ICD-10-CM | POA: Diagnosis not present

## 2020-05-25 DIAGNOSIS — I2699 Other pulmonary embolism without acute cor pulmonale: Secondary | ICD-10-CM | POA: Diagnosis not present

## 2020-05-25 DIAGNOSIS — D509 Iron deficiency anemia, unspecified: Secondary | ICD-10-CM | POA: Diagnosis not present

## 2020-05-25 DIAGNOSIS — I130 Hypertensive heart and chronic kidney disease with heart failure and stage 1 through stage 4 chronic kidney disease, or unspecified chronic kidney disease: Secondary | ICD-10-CM | POA: Diagnosis not present

## 2020-05-25 DIAGNOSIS — D631 Anemia in chronic kidney disease: Secondary | ICD-10-CM | POA: Diagnosis not present

## 2020-05-25 DIAGNOSIS — H547 Unspecified visual loss: Secondary | ICD-10-CM | POA: Diagnosis not present

## 2020-05-25 DIAGNOSIS — I5021 Acute systolic (congestive) heart failure: Secondary | ICD-10-CM | POA: Diagnosis not present

## 2020-05-25 DIAGNOSIS — E1122 Type 2 diabetes mellitus with diabetic chronic kidney disease: Secondary | ICD-10-CM | POA: Diagnosis not present

## 2020-05-25 DIAGNOSIS — I480 Paroxysmal atrial fibrillation: Secondary | ICD-10-CM | POA: Diagnosis not present

## 2020-05-26 DIAGNOSIS — H547 Unspecified visual loss: Secondary | ICD-10-CM | POA: Diagnosis not present

## 2020-05-26 DIAGNOSIS — E1122 Type 2 diabetes mellitus with diabetic chronic kidney disease: Secondary | ICD-10-CM | POA: Diagnosis not present

## 2020-05-26 DIAGNOSIS — D509 Iron deficiency anemia, unspecified: Secondary | ICD-10-CM | POA: Diagnosis not present

## 2020-05-26 DIAGNOSIS — M109 Gout, unspecified: Secondary | ICD-10-CM | POA: Diagnosis not present

## 2020-05-26 DIAGNOSIS — E1151 Type 2 diabetes mellitus with diabetic peripheral angiopathy without gangrene: Secondary | ICD-10-CM | POA: Diagnosis not present

## 2020-05-26 DIAGNOSIS — I130 Hypertensive heart and chronic kidney disease with heart failure and stage 1 through stage 4 chronic kidney disease, or unspecified chronic kidney disease: Secondary | ICD-10-CM | POA: Diagnosis not present

## 2020-05-26 DIAGNOSIS — J449 Chronic obstructive pulmonary disease, unspecified: Secondary | ICD-10-CM | POA: Diagnosis not present

## 2020-05-26 DIAGNOSIS — I5021 Acute systolic (congestive) heart failure: Secondary | ICD-10-CM | POA: Diagnosis not present

## 2020-05-26 DIAGNOSIS — I2699 Other pulmonary embolism without acute cor pulmonale: Secondary | ICD-10-CM | POA: Diagnosis not present

## 2020-05-26 DIAGNOSIS — D631 Anemia in chronic kidney disease: Secondary | ICD-10-CM | POA: Diagnosis not present

## 2020-05-26 DIAGNOSIS — N183 Chronic kidney disease, stage 3 unspecified: Secondary | ICD-10-CM | POA: Diagnosis not present

## 2020-05-26 DIAGNOSIS — I480 Paroxysmal atrial fibrillation: Secondary | ICD-10-CM | POA: Diagnosis not present

## 2020-05-26 DIAGNOSIS — I69398 Other sequelae of cerebral infarction: Secondary | ICD-10-CM | POA: Diagnosis not present

## 2020-05-27 ENCOUNTER — Ambulatory Visit (INDEPENDENT_AMBULATORY_CARE_PROVIDER_SITE_OTHER): Payer: Medicare Other | Admitting: Family Medicine

## 2020-05-27 ENCOUNTER — Encounter: Payer: Self-pay | Admitting: Family Medicine

## 2020-05-27 ENCOUNTER — Other Ambulatory Visit: Payer: Self-pay

## 2020-05-27 ENCOUNTER — Ambulatory Visit (INDEPENDENT_AMBULATORY_CARE_PROVIDER_SITE_OTHER): Payer: Medicare Other

## 2020-05-27 VITALS — BP 138/79 | HR 65 | Temp 96.7°F | Ht 68.0 in | Wt 211.6 lb

## 2020-05-27 DIAGNOSIS — E119 Type 2 diabetes mellitus without complications: Secondary | ICD-10-CM

## 2020-05-27 DIAGNOSIS — I2699 Other pulmonary embolism without acute cor pulmonale: Secondary | ICD-10-CM | POA: Diagnosis not present

## 2020-05-27 DIAGNOSIS — J189 Pneumonia, unspecified organism: Secondary | ICD-10-CM

## 2020-05-27 DIAGNOSIS — I639 Cerebral infarction, unspecified: Secondary | ICD-10-CM

## 2020-05-27 DIAGNOSIS — I10 Essential (primary) hypertension: Secondary | ICD-10-CM | POA: Diagnosis not present

## 2020-05-27 DIAGNOSIS — I480 Paroxysmal atrial fibrillation: Secondary | ICD-10-CM

## 2020-05-27 DIAGNOSIS — R7989 Other specified abnormal findings of blood chemistry: Secondary | ICD-10-CM

## 2020-05-27 DIAGNOSIS — I5021 Acute systolic (congestive) heart failure: Secondary | ICD-10-CM

## 2020-05-27 DIAGNOSIS — I6930 Unspecified sequelae of cerebral infarction: Secondary | ICD-10-CM

## 2020-05-27 DIAGNOSIS — Z6830 Body mass index (BMI) 30.0-30.9, adult: Secondary | ICD-10-CM

## 2020-05-27 DIAGNOSIS — I214 Non-ST elevation (NSTEMI) myocardial infarction: Secondary | ICD-10-CM

## 2020-05-27 DIAGNOSIS — I739 Peripheral vascular disease, unspecified: Secondary | ICD-10-CM | POA: Diagnosis not present

## 2020-05-27 DIAGNOSIS — E7800 Pure hypercholesterolemia, unspecified: Secondary | ICD-10-CM

## 2020-05-27 DIAGNOSIS — Z95828 Presence of other vascular implants and grafts: Secondary | ICD-10-CM

## 2020-05-27 NOTE — Progress Notes (Signed)
Subjective:  Patient ID: Connor Bryant, male    DOB: Aug 06, 1946  Age: 74 y.o. MRN: [id number]  CC: Transitions Of Care (CVA, [place]ine Na)   HPI [name] presents for follow-up on his recent hospitalization at [facility] for ischemic stroke.  He was admitted on May 01, 2020 and discharged on May 17, 2020.  During his stay he also was noted to have left lower lobe pneumonia.  The stroke was diagnosed based on the loss of left-sided peripheral vision.  He presented to the emergency department for this and was diagnosed with ischemic stroke.  Subsequently he was also noted to have a pulmonary embolism and left lower lobe pneumonia.  All of this came just a few days after he had a iliac stent placed.  There is some concerned that the blood clot to the eye and the lung may have come from the stent or the procedure to place the stent.  While hospitalized he developed congestive heart failure and had an echocardiogram showing an ejection fraction of 35%.  He was also noted to have apical akinesis he received IV Lasix in the hospital and continues on oral Lasix plus beta-blocker Imdur and hydralazine.  He was placed on Plavix for NSTEMI as well as atorvastatin and beta-blocker.  Depression screen [facility] 2/9 05/27/2020 02/29/2020 08/27/2019  Decreased Interest 0 0 0  Down, Depressed, Hopeless 0 0 0  PHQ - 2 Score 0 0 0    History Dolphus has a past medical history of Acute respiratory failure with hypoxia ([place]) (05/04/2020), Arthritis, DM type 2 (diabetes mellitus, type 2) ([place]), Endotracheally intubated, Gallstone, Heart murmur, HTN (hypertension), Hyperlipidemia, Personal history of colonic polyp-adenoma (11/24/2013), Renal insufficiency, and Thoracic spine fracture ([place]).   He has a past surgical history that includes Debridement tennis elbow (Right); Tonsillectomy and adenoidectomy; Colonoscopy; Esophagogastroduodenoscopy; Mesenteric artery bypass (N/A, 03/10/2014); abdominal aortagram (N/A, 02/23/2014);  and vascualr surgery.   His family history includes AAA (abdominal aortic aneurysm) in his mother; Bladder Cancer in his father; Cancer in his father; Coronary artery disease in his father; Diabetes in his father and mother; Heart attack in his father; Heart disease in his father and mother; Hyperlipidemia in his father and mother; Hypertension in his father and mother.He reports that he quit smoking about 10 years ago. His smoking use included cigarettes. He has never used smokeless tobacco. He reports current alcohol use of about 12.0 standard drinks of alcohol per week. He reports previous drug use.    ROS Review of Systems  Constitutional: Negative.   HENT: Negative.   Eyes: Positive for visual disturbance.  Respiratory: Positive for shortness of breath (stable, winded easily on exertion). Negative for cough.   Cardiovascular: Negative for chest pain and leg swelling.  Gastrointestinal: Negative for abdominal pain, diarrhea, nausea and vomiting.  Genitourinary: Negative for difficulty urinating.  Musculoskeletal: Negative for arthralgias and myalgias.  Skin: Negative for rash.  Neurological: Negative for weakness, numbness and headaches.  Psychiatric/Behavioral: Negative for sleep disturbance.    Objective:  BP 138/79   Pulse 65   Temp (!) 96.7 F (35.9 C) (Temporal)   Ht _0  (1.727 m)   Wt 211 lb 9.6 oz (96 kg)   BMI 32.17 kg/m   BP Readings from Last 3 Encounters:  05/27/20 138/79  05/17/20 (!) 169/41  02/29/20 131/61    Wt Readings from Last 3 Encounters:  05/27/20 211 lb 9.6 oz (96 kg)  05/16/20 212 lb 4.9 oz (96.3 kg)  02/29/20 227 lb (103 kg)     Physical Exam Constitutional:      General: He is not in acute distress.    Appearance: He is well-developed.  HENT:     Head: Normocephalic and atraumatic.     Right Ear: External ear normal.     Left Ear: External ear normal.     Nose: Nose normal.  Eyes:     Conjunctiva/sclera: Conjunctivae normal.      Pupils: Pupils are equal, round, and reactive to light.  Cardiovascular:     Rate and Rhythm: Normal rate and regular rhythm.     Heart sounds: Normal heart sounds. No murmur heard.   Pulmonary:     Effort: Pulmonary effort is normal. No respiratory distress.     Breath sounds: Normal breath sounds. No wheezing or rales.  Abdominal:     Palpations: Abdomen is soft.     Tenderness: There is no abdominal tenderness.  Musculoskeletal:        General: Normal range of motion.     Cervical back: Normal range of motion and neck supple.  Skin:    General: Skin is warm and dry.  Neurological:     Mental Status: He is alert and oriented to person, place, and time.     Deep Tendon Reflexes: Reflexes are normal and symmetric.  Psychiatric:        Behavior: Behavior normal.        Thought Content: Thought content normal.        Judgment: Judgment normal.    Chest x-ray shows 5% pneumothorax.  Assessment & Plan:   Jillian was seen today for transitions of care.  Diagnoses and all orders for this visit:  Non-ST elevation (NSTEMI) myocardial infarction ([place]) -     CMP14+EGFR -     CBC with Differential/Platelet  Peripheral vascular disease ([place]) -     CMP14+EGFR -     CBC with Differential/Platelet  Body mass index 30.0-30.9, adult -     CMP14+EGFR -     CBC with Differential/Platelet  Essential hypertension -     CMP14+EGFR -     CBC with Differential/Platelet  Type 2 diabetes mellitus without complication, without long-term current use of insulin (HCC) -     CMP14+EGFR -     CBC with Differential/Platelet  Pure hypercholesterolemia -     CMP14+EGFR -     CBC with Differential/Platelet  Acute CVA (cerebrovascular accident) ([place]) -     CMP14+EGFR -     CBC with Differential/Platelet  S/P insertion of iliac artery stent -     CMP14+EGFR -     CBC with Differential/Platelet  PAF (paroxysmal atrial fibrillation) (HCC) -     CMP14+EGFR -     CBC with  Differential/Platelet -     DG Chest 2 View; Future -     TSH + free T4  Acute systolic heart failure (HCC) -     CMP14+EGFR -     CBC with Differential/Platelet -     DG Chest 2 View; Future -     Brain natriuretic peptide  Pulmonary embolism and infarction (HCC) -     CMP14+EGFR -     CBC with Differential/Platelet -     DG Chest 2 View; Future  Community acquired pneumonia, unspecified laterality -     CMP14+EGFR -     CBC with Differential/Platelet -     DG Chest 2 View; Future  Elevated TSH -  TSH + free T4       I am having Arlana Pouch "[name]" maintain his polyethylene glycol, loratadine, allopurinol, clopidogrel, atorvastatin, carvedilol, furosemide, isosorbide mononitrate, apixaban, glimepiride, and hydrALAZINE.  Allergies as of 05/27/2020   No Known Allergies     Medication List       Accurate as of May 27, 2020 11:59 PM. If you have any questions, ask your nurse or doctor.        allopurinol 100 MG tablet Commonly known as: ZYLOPRIM TAKE 1 TABLET 2 TO 3 TIMES A DAY FOR GOUT   apixaban 5 MG Tabs tablet Commonly known as: ELIQUIS Take 1 tablet (5 mg total) by mouth 2 (two) times daily.   atorvastatin 80 MG tablet Commonly known as: LIPITOR Take 1 tablet (80 mg total) by mouth daily.   carvedilol 6.25 MG tablet Commonly known as: COREG Take 1 tablet (6.25 mg total) by mouth 2 (two) times daily with a meal.   clopidogrel 75 MG tablet Commonly known as: PLAVIX Take 75 mg by mouth daily.   furosemide 40 MG tablet Commonly known as: LASIX Take 1 tablet (40 mg total) by mouth daily.   glimepiride 2 MG tablet Commonly known as: Amaryl Take 1 tablet (2 mg total) by mouth every morning.   hydrALAZINE 50 MG tablet Commonly known as: APRESOLINE Take 1 tablet (50 mg total) by mouth 3 (three) times daily.   isosorbide mononitrate 30 MG 24 hr tablet Commonly known as: IMDUR Take 0.5 tablets (15 mg total) by mouth daily.   loratadine 10 MG  tablet Commonly known as: CLARITIN Take 1 tablet (10 mg total) by mouth daily.   polyethylene glycol 17 g packet Commonly known as: MIRALAX / GLYCOLAX Take 17 g by mouth daily. What changed: how much to take        Follow-up: Return in about 3 months (around 08/26/2020).  Claretta Fraise, M.D.

## 2020-05-28 DIAGNOSIS — H547 Unspecified visual loss: Secondary | ICD-10-CM | POA: Diagnosis not present

## 2020-05-28 DIAGNOSIS — I2699 Other pulmonary embolism without acute cor pulmonale: Secondary | ICD-10-CM | POA: Diagnosis not present

## 2020-05-28 DIAGNOSIS — E1122 Type 2 diabetes mellitus with diabetic chronic kidney disease: Secondary | ICD-10-CM | POA: Diagnosis not present

## 2020-05-28 DIAGNOSIS — N183 Chronic kidney disease, stage 3 unspecified: Secondary | ICD-10-CM | POA: Diagnosis not present

## 2020-05-28 DIAGNOSIS — D631 Anemia in chronic kidney disease: Secondary | ICD-10-CM | POA: Diagnosis not present

## 2020-05-28 DIAGNOSIS — E1151 Type 2 diabetes mellitus with diabetic peripheral angiopathy without gangrene: Secondary | ICD-10-CM | POA: Diagnosis not present

## 2020-05-28 DIAGNOSIS — I130 Hypertensive heart and chronic kidney disease with heart failure and stage 1 through stage 4 chronic kidney disease, or unspecified chronic kidney disease: Secondary | ICD-10-CM | POA: Diagnosis not present

## 2020-05-28 DIAGNOSIS — D509 Iron deficiency anemia, unspecified: Secondary | ICD-10-CM | POA: Diagnosis not present

## 2020-05-28 DIAGNOSIS — J449 Chronic obstructive pulmonary disease, unspecified: Secondary | ICD-10-CM | POA: Diagnosis not present

## 2020-05-28 DIAGNOSIS — I5021 Acute systolic (congestive) heart failure: Secondary | ICD-10-CM | POA: Diagnosis not present

## 2020-05-28 DIAGNOSIS — M109 Gout, unspecified: Secondary | ICD-10-CM | POA: Diagnosis not present

## 2020-05-28 DIAGNOSIS — I480 Paroxysmal atrial fibrillation: Secondary | ICD-10-CM | POA: Diagnosis not present

## 2020-05-28 DIAGNOSIS — I69398 Other sequelae of cerebral infarction: Secondary | ICD-10-CM | POA: Diagnosis not present

## 2020-05-28 LAB — CMP14+EGFR
ALT: 16 IU/L (ref 0–44)
AST: 22 IU/L (ref 0–40)
Albumin/Globulin Ratio: 1.2 (ref 1.2–2.2)
Albumin: 3.9 g/dL (ref 3.7–4.7)
Alkaline Phosphatase: 109 IU/L (ref 48–121)
BUN/Creatinine Ratio: 11 (ref 10–24)
BUN: 19 mg/dL (ref 8–27)
Bilirubin Total: 0.6 mg/dL (ref 0.0–1.2)
CO2: 23 mmol/L (ref 20–29)
Calcium: 9 mg/dL (ref 8.6–10.2)
Chloride: 103 mmol/L (ref 96–106)
Creatinine, Ser: 1.77 mg/dL — ABNORMAL HIGH (ref 0.76–1.27)
GFR calc Af Amer: 42 mL/min/{1.73_m2} — ABNORMAL LOW (ref >59)
GFR calc non Af Amer: 37 mL/min/{1.73_m2} — ABNORMAL LOW (ref >59)
Globulin, Total: 3.2 g/dL (ref 1.5–4.5)
Glucose: 168 mg/dL — ABNORMAL HIGH (ref 65–99)
Potassium: 3.9 mmol/L (ref 3.5–5.2)
Sodium: 141 mmol/L (ref 134–144)
Total Protein: 7.1 g/dL (ref 6.0–8.5)

## 2020-05-28 LAB — CBC WITH DIFFERENTIAL/PLATELET
Basophils Absolute: 0 10*3/uL (ref 0.0–0.2)
Basos: 1 %
EOS (ABSOLUTE): 0.3 10*3/uL (ref 0.0–0.4)
Eos: 7 %
Hematocrit: 31.4 % — ABNORMAL LOW (ref 37.5–51.0)
Hemoglobin: 9.8 g/dL — ABNORMAL LOW (ref 13.0–17.7)
Immature Grans (Abs): 0 10*3/uL (ref 0.0–0.1)
Immature Granulocytes: 0 %
Lymphocytes Absolute: 1.1 10*3/uL (ref 0.7–3.1)
Lymphs: 23 %
MCH: 28.2 pg (ref 26.6–33.0)
MCHC: 31.2 g/dL — ABNORMAL LOW (ref 31.5–35.7)
MCV: 91 fL (ref 79–97)
Monocytes Absolute: 0.4 10*3/uL (ref 0.1–0.9)
Monocytes: 8 %
Neutrophils Absolute: 2.9 10*3/uL (ref 1.4–7.0)
Neutrophils: 61 %
Platelets: 218 10*3/uL (ref 150–450)
RBC: 3.47 x10E6/uL — ABNORMAL LOW (ref 4.14–5.80)
RDW: 15.3 % (ref 11.6–15.4)
WBC: 4.8 10*3/uL (ref 3.4–10.8)

## 2020-05-28 LAB — TSH+FREE T4
Free T4: 1.23 ng/dL (ref 0.82–1.77)
TSH: 4.52 u[IU]/mL — ABNORMAL HIGH (ref 0.450–4.500)

## 2020-05-28 LAB — BRAIN NATRIURETIC PEPTIDE: BNP: 383.4 pg/mL — ABNORMAL HIGH (ref 0.0–100.0)

## 2020-05-31 ENCOUNTER — Ambulatory Visit: Payer: Medicare Other | Admitting: Family Medicine

## 2020-05-31 ENCOUNTER — Other Ambulatory Visit: Payer: Self-pay

## 2020-05-31 ENCOUNTER — Encounter: Payer: Self-pay | Admitting: Family Medicine

## 2020-05-31 ENCOUNTER — Ambulatory Visit (INDEPENDENT_AMBULATORY_CARE_PROVIDER_SITE_OTHER): Payer: Medicare Other

## 2020-05-31 VITALS — BP 190/80 | HR 76 | Ht 68.0 in | Wt 214.0 lb

## 2020-05-31 DIAGNOSIS — E1122 Type 2 diabetes mellitus with diabetic chronic kidney disease: Secondary | ICD-10-CM

## 2020-05-31 DIAGNOSIS — I480 Paroxysmal atrial fibrillation: Secondary | ICD-10-CM

## 2020-05-31 DIAGNOSIS — R011 Cardiac murmur, unspecified: Secondary | ICD-10-CM

## 2020-05-31 DIAGNOSIS — I130 Hypertensive heart and chronic kidney disease with heart failure and stage 1 through stage 4 chronic kidney disease, or unspecified chronic kidney disease: Secondary | ICD-10-CM

## 2020-05-31 DIAGNOSIS — J449 Chronic obstructive pulmonary disease, unspecified: Secondary | ICD-10-CM | POA: Diagnosis not present

## 2020-05-31 DIAGNOSIS — Z95820 Peripheral vascular angioplasty status with implants and grafts: Secondary | ICD-10-CM

## 2020-05-31 DIAGNOSIS — I5021 Acute systolic (congestive) heart failure: Secondary | ICD-10-CM

## 2020-05-31 DIAGNOSIS — I739 Peripheral vascular disease, unspecified: Secondary | ICD-10-CM

## 2020-05-31 DIAGNOSIS — I2699 Other pulmonary embolism without acute cor pulmonale: Secondary | ICD-10-CM

## 2020-05-31 DIAGNOSIS — D509 Iron deficiency anemia, unspecified: Secondary | ICD-10-CM

## 2020-05-31 DIAGNOSIS — I214 Non-ST elevation (NSTEMI) myocardial infarction: Secondary | ICD-10-CM | POA: Diagnosis not present

## 2020-05-31 DIAGNOSIS — H547 Unspecified visual loss: Secondary | ICD-10-CM

## 2020-05-31 DIAGNOSIS — D631 Anemia in chronic kidney disease: Secondary | ICD-10-CM

## 2020-05-31 DIAGNOSIS — N183 Chronic kidney disease, stage 3 unspecified: Secondary | ICD-10-CM

## 2020-05-31 DIAGNOSIS — M1990 Unspecified osteoarthritis, unspecified site: Secondary | ICD-10-CM

## 2020-05-31 DIAGNOSIS — E7800 Pure hypercholesterolemia, unspecified: Secondary | ICD-10-CM

## 2020-05-31 DIAGNOSIS — I10 Essential (primary) hypertension: Secondary | ICD-10-CM

## 2020-05-31 DIAGNOSIS — I69398 Other sequelae of cerebral infarction: Secondary | ICD-10-CM | POA: Diagnosis not present

## 2020-05-31 DIAGNOSIS — I4581 Long QT syndrome: Secondary | ICD-10-CM

## 2020-05-31 DIAGNOSIS — M109 Gout, unspecified: Secondary | ICD-10-CM

## 2020-05-31 DIAGNOSIS — E1151 Type 2 diabetes mellitus with diabetic peripheral angiopathy without gangrene: Secondary | ICD-10-CM

## 2020-05-31 DIAGNOSIS — I639 Cerebral infarction, unspecified: Secondary | ICD-10-CM | POA: Diagnosis not present

## 2020-05-31 MED ORDER — CARVEDILOL 12.5 MG PO TABS: 12.5000 mg | ORAL_TABLET | Freq: Two times a day (BID) | ORAL | 3 refills | Status: DC

## 2020-05-31 NOTE — Patient Instructions (Addendum)
Medication Instructions:   Your physician has recommended you make the following change in your medication:   Increase carvedilol to 12.5 mg by mouth twice daily  Continue other medications the same  Labwork:  None  Testing/Procedures: Your physician has requested that you have an echocardiogram in 6 weeks. Echocardiography is a painless test that uses sound waves to create images of your heart. It provides your doctor with information about the size and shape of your heart and how well your heart's chambers and valves are working. This procedure takes approximately one hour. There are no restrictions for this procedure. Your physician has recommended that you wear an event monitor for 30 days. Event monitors are medical devices that record the heart's electrical activity. Doctors most often [place] these monitors to diagnose arrhythmias. Arrhythmias are problems with the speed or rhythm of the heartbeat. The monitor is a small, portable device. You can wear one while you do your normal daily activities. This is usually used to diagnose what is causing palpitations/syncope (passing out). [employer] will send this monitor to your home address.  Follow-Up:  Your physician recommends that you schedule a follow-up appointment in: 2.5 months.   Any Other Special Instructions Will Be Listed Below (If Applicable).  If you need a refill on your cardiac medications before your next appointment, please call your pharmacy.

## 2020-05-31 NOTE — Progress Notes (Signed)
Cardiology Office Note  Date: 05/31/2020   ID: CUAHUTEMOC ATTAR, DOB [birth year], MRN [id number]  PCP:  Claretta Fraise, MD  Cardiologist:  Carlyle Dolly, MD Electrophysiologist:  None   Chief Complaint:   History of Present Illness: Connor Bryant is a 74 y.o. male with a history of recent iliac stents, diabetes, hypertension.    History of bilateral iliac stents.  2 on the right and 1 on the left placed 5 days prior to his presentation at [facility].  Other history includes PVD, HTN, DM 2, HLD, PAF, acute respiratory failure with hypoxia, NSTEMI, acute systolic heart failure.  History of PE noted on CT 2015.  CKD stage III, COPD with a long history of tobacco abuse.  PVD: status post mesenteric artery bypass with recent bilateral iliac stenting at [facility] 04/27/2020.  Anemia chronic iron deficiency anemia noted.  Recent hospital admission.  Presented with complaints to the emergency room on May 01, 2020 of losing vision in his left eye.  Started the prior day.  Stated he woke up Saturday morning and had loss of vision in the left side.  Stated it felt like a dark shadow on the peripheray of his left eye.  Reported some tingling in his left arm which had resolved.  Denied any other symptoms of CVA.  Also had elevated troponins.  Classified as NSTEMI.  Troponin peaked at 2500.  Was not a candidate for emergent catheterization at that time.  Discharge summary stated may reflect stress-induced cardiomyopathy versus MI.  Case discussed with cardiology.  Plans for cardiac catheterization in the outpatient setting were discussed.  Acute kidney injury on CKD stage III.  Echocardiogram showed LVEF of 35% and apical akinesis.  Baseline creatinine was 1.2-1.3.  Creatinine peaked at 2.2 which was likely related to recent diuresis and IV contrast.  ARB was being held.  Noted to have probable PAF.  He was started on beta-blocker by cardiology 25 mg 3 times daily.  This was increased to 37.5 3  times daily.  Subsequently transitioned to Toprol.  Anticoagulation with Eliquis.  Patient was noted to have left lower lobe pneumonia and treated with cefepime.   Patient is here for hospital follow-up today.  He states the only issue he is having is left-sided peripheral vision is blurred.  He has no other focal neurologic deficits.  Denies any slurred speech, unilateral weakness, he states he is feeling better.  He denies any anginal or exertional symptoms.  States he is working with PT and has very slight dyspnea when walking with PT at home.  Recent follow-up labs showed BNP of three 3.4, glucose 168, creatinine 1.77, GFR 37, hemoglobin 9.8, hematocrit 31.4, TSH 4.5, free T4 1.23.  He denies any sensation of arrhythmias/palpitations.  No orthostatic symptoms, no bleeding.  No PND or orthopnea.  No claudication-like symptoms, DVT or PE-like symptoms, or lower extremity edema.  His blood pressure is elevated at 190/80.  Patient states he has not taken his afternoon dose of hydralazine.  Past Medical History:  Diagnosis Date  . Acute respiratory failure with hypoxia ([place]) 05/04/2020  . Arthritis   . DM type 2 (diabetes mellitus, type 2) (HCC)    no meds  . Endotracheally intubated   . Gallstone   . Heart murmur    was told one time he had a murmur, not since 30 years ago  . HTN (hypertension)   . Hyperlipidemia   . Personal history of colonic polyp-adenoma  11/24/2013  . Renal insufficiency    mild  . Thoracic spine fracture [facility])     Past Surgical History:  Procedure Laterality Date  . ABDOMINAL AORTAGRAM N/A 02/23/2014   Procedure: ABDOMINAL [name];  Surgeon: Conrad Deary, MD;  Location: [facility] CATH LAB;  Service: Cardiovascular;  Laterality: N/A;  . COLONOSCOPY    . DEBRIDEMENT TENNIS ELBOW Right   . ESOPHAGOGASTRODUODENOSCOPY    . MESENTERIC ARTERY BYPASS N/A 03/10/2014   Procedure: AORTO TO SUPERIOR MESENTERIC ARTERY BYPASS;  Surgeon: [name], MD;  Location: [place];  Service:  Vascular;  Laterality: N/A;  . TONSILLECTOMY AND ADENOIDECTOMY    . vascualr surgery     illiac stents    Current Outpatient Medications  Medication Sig Dispense Refill  . allopurinol (ZYLOPRIM) 100 MG tablet TAKE 1 TABLET 2 TO 3 TIMES A DAY FOR GOUT 90 tablet 3  . apixaban (ELIQUIS) 5 MG TABS tablet Take 1 tablet (5 mg total) by mouth 2 (two) times daily. 60 tablet 1  . atorvastatin (LIPITOR) 80 MG tablet Take 1 tablet (80 mg total) by mouth daily. 30 tablet 1  . clopidogrel (PLAVIX) 75 MG tablet Take 75 mg by mouth daily.    . furosemide (LASIX) 40 MG tablet Take 20 mg by mouth daily.    Marland Kitchen glimepiride (AMARYL) 2 MG tablet Take 1 tablet (2 mg total) by mouth every morning. 30 tablet 11  . hydrALAZINE (APRESOLINE) 50 MG tablet Take 1 tablet (50 mg total) by mouth 3 (three) times daily. 270 tablet 0  . isosorbide mononitrate (IMDUR) 30 MG 24 hr tablet Take 0.5 tablets (15 mg total) by mouth daily. 30 tablet 0  . polyethylene glycol (MIRALAX / GLYCOLAX) packet Take 17 g by mouth daily. (Patient taking differently: Take 8.5 g by mouth daily. ) 30 packet 1  . carvedilol (COREG) 12.5 MG tablet Take 1 tablet (12.5 mg total) by mouth 2 (two) times daily. 180 tablet 3   No current facility-administered medications for this visit.   Allergies:  Patient has no known allergies.   Social History: The patient  reports that he quit smoking about 10 years ago. His smoking use included cigarettes. He has never used smokeless tobacco. He reports current alcohol use of about 12.0 standard drinks of alcohol per week. He reports previous drug use.   Family History: The patient's family history includes AAA (abdominal aortic aneurysm) in his mother; Bladder Cancer in his father; Cancer in his father; Coronary artery disease in his father; Diabetes in his father and mother; Heart attack in his father; Heart disease in his father and mother; Hyperlipidemia in his father and mother; Hypertension in his father and  mother.   ROS:  Please see the history of present illness. Otherwise, complete review of systems is positive for none.  All other systems are reviewed and negative.   Physical Exam: VS:  BP (!) 190/80 (BP Location: Right Arm, Cuff Size: Normal)   Pulse 76   Ht 5\' 8"  (1.727 m)   Wt 214 lb (97.1 kg)   SpO2 98%   BMI 32.54 kg/m , BMI Body mass index is 32.54 kg/m.  Wt Readings from Last 3 Encounters:  05/31/20 214 lb (97.1 kg)  05/27/20 211 lb 9.6 oz (96 kg)  05/16/20 212 lb 4.9 oz (96.3 kg)    General: Patient appears comfortable at rest. Neck: Supple, no elevated JVP or carotid bruits, no thyromegaly. Lungs: Clear to auscultation, nonlabored breathing at rest. Cardiac:  Regular rate and rhythm, no S3 or significant systolic murmur, no pericardial rub. Extremities: No pitting edema, distal pulses 2+. Skin: Warm and dry. Musculoskeletal: No kyphosis. Neuropsychiatric: Alert and oriented x3, affect grossly appropriate.  ECG:  EKG May 13, 2020 showed sinus tachycardia with a rate of 119, rightward axis, septal infarct, age undetermined.  Recent Labwork: 05/11/2020: Magnesium 1.8 05/27/2020: ALT 16; AST 22; BNP 383.4; BUN 19; Creatinine, Ser 1.77; Hemoglobin 9.8; Platelets 218; Potassium 3.9; Sodium 141; TSH 4.520     Component Value Date/Time   CHOL 137 05/23/2020 1009   TRIG 103 05/23/2020 1009   HDL 53 05/23/2020 1009   CHOLHDL 2.6 05/23/2020 1009   CHOLHDL 2.2 05/02/2020 0400   VLDL 14 05/02/2020 0400   LDLCALC 65 05/23/2020 1009    Other Studies Reviewed Today:  MRI angio neck and brain without contrast 05/02/2020 IMPRESSION: 1. Acute right occipital infarct. 2. Nonvisualization of the proximal right vertebral artery inferior to the level of the glottis, concerning for occlusion. 3. Atheromatous disease with multifocal intracranial stenoses most notably is follows. 4. Short-segment moderate to severe proximal right and distal bilateral intracavernous ICA narrowing.  Both of the intracavernous ICAs demonstrate mild short-segment pre stenotic dilatation. 5. Moderate to severe short segment narrowing of the distal left ICA cervical segment. 6. Approximately 40% luminal narrowing of the proximal left ICA, distal to the bifurcation. 7. Please note that motion artifact on MRA head and neck imaging limits evaluation.    Limited echocardiogram 05/04/2020 1. The entire apex from mid to distal ventricle is akinetic. Pattern can be consistent with stress induced cardiomyopathy/Takotsubo CM, however cannot exclude ischemic etiology . Left ventricular ejection fraction, by estimation, is 35%. The left ventricle has moderately decreased function. 2. The inferior vena cava is normal in size with greater than 50% respiratory variability, suggesting right atrial pressure of 3 mmHg.  Echocardiogram 05/02/2020  1. Left ventricular ejection fraction, by estimation, is 60 to 65%. The left ventricle has normal function. Left ventricular endocardial border not optimally defined to evaluate regional wall motion. The left ventricular internal cavity size was mildly dilated. There is mild left ventricular hypertrophy. Left ventricular diastolic parameters are indeterminate. 2. Right ventricular systolic function is normal. The right ventricular size is normal. 3. The mitral valve is abnormal. Trivial mitral valve regurgitation. 4. AV is thickened, calcified with mildly restricted motion. Peak and mean gradients through the valve are 31 and 18 mm Hg respectively. . The aortic valve is abnormal. Aortic valve regurgitation is not visualized.   Peripheral Angiography: 04/27/2020 PREOPERATIVE DIAGNOSES: 1. Atherosclerosis of extremities with lifestyle-limiting claudication. 2. Previous aortomesenteric bypass.  POSTOPERATIVE DIAGNOSES: 1. Atherosclerosis of extremities with lifestyle-limiting claudication. 2. Previous aortomesenteric bypass.  PROCEDURES: 1.  Revascularization of right common iliac artery (10 x 29 and 10 x 39 mm Omnilink bare-metal balloon mounted stent). 2. Revascularization of right external iliac artery (8 mm standard balloon angioplasty). 3. Revascularization of left external iliac artery (8 mm standard balloon angioplasty). 4. Mynx closure of bilateral common femoral arteries.    Assessment and Plan:   1. Acute CVA (cerebrovascular accident) [facility]) Recent presentation with loss of vision to left eye, specifically left peripheral vision.  Neuroimaging revealed right occipital infarct.  He was started on Eliquis with recommendation to continue Plavix by vascular surgeon Dr. Rosana Hoes at [facility].  Discharge note states patient would likely need a Holter monitor 30-day in outpatient setting.  Arrangements had been made during recent hospitalization hospitalization.  Please get a [employer]  30-day monitor.  Continue Plavix 75 mg daily as well as Eliquis 5 mg p.o. twice daily.  2. Acute systolic heart failure (HCC) Echocardiogram at recent visit showedPattern to be consistent with stress-induced cardiomyopathy/Takotsubo, however could not exclude ischemia as etiology.  LVEF was 35%.  There was mild LVH.  Trivial MR.  Plans were for cardiac catheterization in the outpatient setting.  Recent BNP on 05/27/2020 383.4.  Patient received IV Lasix during recent hospitalization with a mild bump in creatinine.  Lasix was changed to oral.  To continue beta-blocker, Imdur and hydralazine. Get BMP and CBC . Schedule Echocardiogram in 6 weeks to recheck LV function.  Increase Coreg to 12.5 mg p.o. twice daily.  Continue Lasix 20 mg daily.  Continue Imdur 30 mg daily.  3. Non-ST elevation (NSTEMI) myocardial infarction [facility]) Diagnosis of NSTEMI at recent admission with elevated troponin peak of 2500.  Cardiac catheterization was discussed to be done outpatient.  We will defer cardiac catheterization for now until echo results are back.  If patient has  stress-induced cardiomyopathy follow-up echocardiogram which shows improved EF and may be no need to perform cardiac catheterization unless symptoms indicate.  Continue Imdur 30 mg daily.  Carvedilol 12.5 mg p.o. twice daily.  Plavix 75 mg daily.   4. PAF (paroxysmal atrial fibrillation) [facility]) Discharge summary mentions probable paroxysmal atrial fibrillation and patient will likely require outpatient 30-day monitor for further evaluation given acute CVA.  His heart rates were elevated overnight on May 05, 2020.  He was started on Lopressor 25 mg 3 times daily which was subsequently increased to 37.5 mg 3 times daily.  Beta-blocker was later changed to Coreg.  He was being anticoagulated with Eliquis. Please get a [employer] 30-day monitor.  Increase Coreg to 12.5 mg p.o. twice daily.  Continue Eliquis 5 mg p.o. twice daily.  5.  PAD Patient had recent bilateral iliac stents at [facility], 2 on the right and 1 on the left.  History of mesenteric artery bypass.  Denies any current claudication-like symptoms.   6.  Essential hypertension Blood pressure elevated today on arrival at 190/80.  Patient states he has not taken his afternoon hydralazine.  Continue hydralazine 50 mg p.o. 3 times daily.  Increase carvedilol to 12.5 mg p.o. twice daily.  Medication Adjustments/Labs and Tests Ordered: Current medicines are reviewed at length with the patient today.  Concerns regarding medicines are outlined above.   Disposition: Follow-up with Dr. Harl Bowie or APP 6 weeks  Signed, Levell July, NP 05/31/2020 5:35 PM    [facility] [place], [place], [place] [address] Phone: [phone]; Fax: [phone]

## 2020-06-01 DIAGNOSIS — J449 Chronic obstructive pulmonary disease, unspecified: Secondary | ICD-10-CM | POA: Diagnosis not present

## 2020-06-01 DIAGNOSIS — E1122 Type 2 diabetes mellitus with diabetic chronic kidney disease: Secondary | ICD-10-CM | POA: Diagnosis not present

## 2020-06-01 DIAGNOSIS — D509 Iron deficiency anemia, unspecified: Secondary | ICD-10-CM | POA: Diagnosis not present

## 2020-06-01 DIAGNOSIS — I2699 Other pulmonary embolism without acute cor pulmonale: Secondary | ICD-10-CM | POA: Diagnosis not present

## 2020-06-01 DIAGNOSIS — I480 Paroxysmal atrial fibrillation: Secondary | ICD-10-CM | POA: Diagnosis not present

## 2020-06-01 DIAGNOSIS — I130 Hypertensive heart and chronic kidney disease with heart failure and stage 1 through stage 4 chronic kidney disease, or unspecified chronic kidney disease: Secondary | ICD-10-CM | POA: Diagnosis not present

## 2020-06-01 DIAGNOSIS — N183 Chronic kidney disease, stage 3 unspecified: Secondary | ICD-10-CM | POA: Diagnosis not present

## 2020-06-01 DIAGNOSIS — D631 Anemia in chronic kidney disease: Secondary | ICD-10-CM | POA: Diagnosis not present

## 2020-06-01 DIAGNOSIS — I69398 Other sequelae of cerebral infarction: Secondary | ICD-10-CM | POA: Diagnosis not present

## 2020-06-01 DIAGNOSIS — H547 Unspecified visual loss: Secondary | ICD-10-CM | POA: Diagnosis not present

## 2020-06-01 DIAGNOSIS — M109 Gout, unspecified: Secondary | ICD-10-CM | POA: Diagnosis not present

## 2020-06-01 DIAGNOSIS — I5021 Acute systolic (congestive) heart failure: Secondary | ICD-10-CM | POA: Diagnosis not present

## 2020-06-01 DIAGNOSIS — E1151 Type 2 diabetes mellitus with diabetic peripheral angiopathy without gangrene: Secondary | ICD-10-CM | POA: Diagnosis not present

## 2020-06-03 DIAGNOSIS — I2699 Other pulmonary embolism without acute cor pulmonale: Secondary | ICD-10-CM | POA: Diagnosis not present

## 2020-06-03 DIAGNOSIS — H547 Unspecified visual loss: Secondary | ICD-10-CM | POA: Diagnosis not present

## 2020-06-03 DIAGNOSIS — E1122 Type 2 diabetes mellitus with diabetic chronic kidney disease: Secondary | ICD-10-CM | POA: Diagnosis not present

## 2020-06-03 DIAGNOSIS — I480 Paroxysmal atrial fibrillation: Secondary | ICD-10-CM | POA: Diagnosis not present

## 2020-06-03 DIAGNOSIS — I69398 Other sequelae of cerebral infarction: Secondary | ICD-10-CM | POA: Diagnosis not present

## 2020-06-03 DIAGNOSIS — D509 Iron deficiency anemia, unspecified: Secondary | ICD-10-CM | POA: Diagnosis not present

## 2020-06-03 DIAGNOSIS — I130 Hypertensive heart and chronic kidney disease with heart failure and stage 1 through stage 4 chronic kidney disease, or unspecified chronic kidney disease: Secondary | ICD-10-CM | POA: Diagnosis not present

## 2020-06-03 DIAGNOSIS — I5021 Acute systolic (congestive) heart failure: Secondary | ICD-10-CM | POA: Diagnosis not present

## 2020-06-03 DIAGNOSIS — M109 Gout, unspecified: Secondary | ICD-10-CM | POA: Diagnosis not present

## 2020-06-03 DIAGNOSIS — E1151 Type 2 diabetes mellitus with diabetic peripheral angiopathy without gangrene: Secondary | ICD-10-CM | POA: Diagnosis not present

## 2020-06-03 DIAGNOSIS — J449 Chronic obstructive pulmonary disease, unspecified: Secondary | ICD-10-CM | POA: Diagnosis not present

## 2020-06-03 DIAGNOSIS — D631 Anemia in chronic kidney disease: Secondary | ICD-10-CM | POA: Diagnosis not present

## 2020-06-03 DIAGNOSIS — N183 Chronic kidney disease, stage 3 unspecified: Secondary | ICD-10-CM | POA: Diagnosis not present

## 2020-06-07 DIAGNOSIS — D509 Iron deficiency anemia, unspecified: Secondary | ICD-10-CM | POA: Diagnosis not present

## 2020-06-07 DIAGNOSIS — I2699 Other pulmonary embolism without acute cor pulmonale: Secondary | ICD-10-CM | POA: Diagnosis not present

## 2020-06-07 DIAGNOSIS — M109 Gout, unspecified: Secondary | ICD-10-CM | POA: Diagnosis not present

## 2020-06-07 DIAGNOSIS — H547 Unspecified visual loss: Secondary | ICD-10-CM | POA: Diagnosis not present

## 2020-06-07 DIAGNOSIS — I5021 Acute systolic (congestive) heart failure: Secondary | ICD-10-CM | POA: Diagnosis not present

## 2020-06-07 DIAGNOSIS — N183 Chronic kidney disease, stage 3 unspecified: Secondary | ICD-10-CM | POA: Diagnosis not present

## 2020-06-07 DIAGNOSIS — D631 Anemia in chronic kidney disease: Secondary | ICD-10-CM | POA: Diagnosis not present

## 2020-06-07 DIAGNOSIS — I480 Paroxysmal atrial fibrillation: Secondary | ICD-10-CM | POA: Diagnosis not present

## 2020-06-07 DIAGNOSIS — E1122 Type 2 diabetes mellitus with diabetic chronic kidney disease: Secondary | ICD-10-CM | POA: Diagnosis not present

## 2020-06-07 DIAGNOSIS — I130 Hypertensive heart and chronic kidney disease with heart failure and stage 1 through stage 4 chronic kidney disease, or unspecified chronic kidney disease: Secondary | ICD-10-CM | POA: Diagnosis not present

## 2020-06-07 DIAGNOSIS — E1151 Type 2 diabetes mellitus with diabetic peripheral angiopathy without gangrene: Secondary | ICD-10-CM | POA: Diagnosis not present

## 2020-06-07 DIAGNOSIS — I69398 Other sequelae of cerebral infarction: Secondary | ICD-10-CM | POA: Diagnosis not present

## 2020-06-07 DIAGNOSIS — J449 Chronic obstructive pulmonary disease, unspecified: Secondary | ICD-10-CM | POA: Diagnosis not present

## 2020-06-08 ENCOUNTER — Ambulatory Visit (INDEPENDENT_AMBULATORY_CARE_PROVIDER_SITE_OTHER): Payer: Medicare Other

## 2020-06-08 DIAGNOSIS — N183 Chronic kidney disease, stage 3 unspecified: Secondary | ICD-10-CM | POA: Diagnosis not present

## 2020-06-08 DIAGNOSIS — I480 Paroxysmal atrial fibrillation: Secondary | ICD-10-CM

## 2020-06-08 DIAGNOSIS — I129 Hypertensive chronic kidney disease with stage 1 through stage 4 chronic kidney disease, or unspecified chronic kidney disease: Secondary | ICD-10-CM | POA: Diagnosis not present

## 2020-06-08 DIAGNOSIS — I639 Cerebral infarction, unspecified: Secondary | ICD-10-CM

## 2020-06-08 DIAGNOSIS — I739 Peripheral vascular disease, unspecified: Secondary | ICD-10-CM | POA: Diagnosis not present

## 2020-06-08 DIAGNOSIS — I5020 Unspecified systolic (congestive) heart failure: Secondary | ICD-10-CM | POA: Diagnosis not present

## 2020-06-08 DIAGNOSIS — I2510 Atherosclerotic heart disease of native coronary artery without angina pectoris: Secondary | ICD-10-CM | POA: Diagnosis not present

## 2020-06-09 DIAGNOSIS — H547 Unspecified visual loss: Secondary | ICD-10-CM | POA: Diagnosis not present

## 2020-06-09 DIAGNOSIS — M109 Gout, unspecified: Secondary | ICD-10-CM | POA: Diagnosis not present

## 2020-06-09 DIAGNOSIS — I2699 Other pulmonary embolism without acute cor pulmonale: Secondary | ICD-10-CM | POA: Diagnosis not present

## 2020-06-09 DIAGNOSIS — I5021 Acute systolic (congestive) heart failure: Secondary | ICD-10-CM | POA: Diagnosis not present

## 2020-06-09 DIAGNOSIS — I69398 Other sequelae of cerebral infarction: Secondary | ICD-10-CM | POA: Diagnosis not present

## 2020-06-09 DIAGNOSIS — D631 Anemia in chronic kidney disease: Secondary | ICD-10-CM | POA: Diagnosis not present

## 2020-06-09 DIAGNOSIS — I480 Paroxysmal atrial fibrillation: Secondary | ICD-10-CM | POA: Diagnosis not present

## 2020-06-09 DIAGNOSIS — E1122 Type 2 diabetes mellitus with diabetic chronic kidney disease: Secondary | ICD-10-CM | POA: Diagnosis not present

## 2020-06-09 DIAGNOSIS — N183 Chronic kidney disease, stage 3 unspecified: Secondary | ICD-10-CM | POA: Diagnosis not present

## 2020-06-09 DIAGNOSIS — E1151 Type 2 diabetes mellitus with diabetic peripheral angiopathy without gangrene: Secondary | ICD-10-CM | POA: Diagnosis not present

## 2020-06-09 DIAGNOSIS — J449 Chronic obstructive pulmonary disease, unspecified: Secondary | ICD-10-CM | POA: Diagnosis not present

## 2020-06-09 DIAGNOSIS — D509 Iron deficiency anemia, unspecified: Secondary | ICD-10-CM | POA: Diagnosis not present

## 2020-06-09 DIAGNOSIS — I130 Hypertensive heart and chronic kidney disease with heart failure and stage 1 through stage 4 chronic kidney disease, or unspecified chronic kidney disease: Secondary | ICD-10-CM | POA: Diagnosis not present

## 2020-06-14 ENCOUNTER — Telehealth: Payer: Self-pay | Admitting: *Deleted

## 2020-06-14 DIAGNOSIS — I5021 Acute systolic (congestive) heart failure: Secondary | ICD-10-CM | POA: Diagnosis not present

## 2020-06-14 DIAGNOSIS — I69398 Other sequelae of cerebral infarction: Secondary | ICD-10-CM | POA: Diagnosis not present

## 2020-06-14 DIAGNOSIS — H547 Unspecified visual loss: Secondary | ICD-10-CM | POA: Diagnosis not present

## 2020-06-14 DIAGNOSIS — D631 Anemia in chronic kidney disease: Secondary | ICD-10-CM | POA: Diagnosis not present

## 2020-06-14 DIAGNOSIS — I480 Paroxysmal atrial fibrillation: Secondary | ICD-10-CM | POA: Diagnosis not present

## 2020-06-14 DIAGNOSIS — E1122 Type 2 diabetes mellitus with diabetic chronic kidney disease: Secondary | ICD-10-CM | POA: Diagnosis not present

## 2020-06-14 DIAGNOSIS — I130 Hypertensive heart and chronic kidney disease with heart failure and stage 1 through stage 4 chronic kidney disease, or unspecified chronic kidney disease: Secondary | ICD-10-CM | POA: Diagnosis not present

## 2020-06-14 DIAGNOSIS — J449 Chronic obstructive pulmonary disease, unspecified: Secondary | ICD-10-CM | POA: Diagnosis not present

## 2020-06-14 DIAGNOSIS — M109 Gout, unspecified: Secondary | ICD-10-CM | POA: Diagnosis not present

## 2020-06-14 DIAGNOSIS — D509 Iron deficiency anemia, unspecified: Secondary | ICD-10-CM | POA: Diagnosis not present

## 2020-06-14 DIAGNOSIS — E1151 Type 2 diabetes mellitus with diabetic peripheral angiopathy without gangrene: Secondary | ICD-10-CM | POA: Diagnosis not present

## 2020-06-14 DIAGNOSIS — N183 Chronic kidney disease, stage 3 unspecified: Secondary | ICD-10-CM | POA: Diagnosis not present

## 2020-06-14 DIAGNOSIS — I2699 Other pulmonary embolism without acute cor pulmonale: Secondary | ICD-10-CM | POA: Diagnosis not present

## 2020-06-14 MED ORDER — ISOSORBIDE MONONITRATE ER 30 MG PO TB24
30.0000 mg | ORAL_TABLET | Freq: Every day | ORAL | 2 refills | Status: DC
Start: 2020-06-14 — End: 2021-03-09

## 2020-06-14 NOTE — Telephone Encounter (Signed)
Yes please send whole pill. Thanks

## 2020-06-14 NOTE — Telephone Encounter (Signed)
Patient reports taking imdur 30 mg daily vs 15 mg daily. Reports he thought he was supposed to take the whole pill. Now, he is needing an early refill d/t taking whole pill vs 1/2 pill. Please advise if whole pill can be sent to pharmacy.

## 2020-06-20 DIAGNOSIS — H547 Unspecified visual loss: Secondary | ICD-10-CM | POA: Diagnosis not present

## 2020-06-20 DIAGNOSIS — E1151 Type 2 diabetes mellitus with diabetic peripheral angiopathy without gangrene: Secondary | ICD-10-CM | POA: Diagnosis not present

## 2020-06-20 DIAGNOSIS — I480 Paroxysmal atrial fibrillation: Secondary | ICD-10-CM | POA: Diagnosis not present

## 2020-06-20 DIAGNOSIS — I5021 Acute systolic (congestive) heart failure: Secondary | ICD-10-CM | POA: Diagnosis not present

## 2020-06-20 DIAGNOSIS — I2699 Other pulmonary embolism without acute cor pulmonale: Secondary | ICD-10-CM | POA: Diagnosis not present

## 2020-06-20 DIAGNOSIS — J449 Chronic obstructive pulmonary disease, unspecified: Secondary | ICD-10-CM | POA: Diagnosis not present

## 2020-06-20 DIAGNOSIS — D631 Anemia in chronic kidney disease: Secondary | ICD-10-CM | POA: Diagnosis not present

## 2020-06-20 DIAGNOSIS — E1122 Type 2 diabetes mellitus with diabetic chronic kidney disease: Secondary | ICD-10-CM | POA: Diagnosis not present

## 2020-06-20 DIAGNOSIS — N183 Chronic kidney disease, stage 3 unspecified: Secondary | ICD-10-CM | POA: Diagnosis not present

## 2020-06-20 DIAGNOSIS — I130 Hypertensive heart and chronic kidney disease with heart failure and stage 1 through stage 4 chronic kidney disease, or unspecified chronic kidney disease: Secondary | ICD-10-CM | POA: Diagnosis not present

## 2020-06-20 DIAGNOSIS — M109 Gout, unspecified: Secondary | ICD-10-CM | POA: Diagnosis not present

## 2020-06-20 DIAGNOSIS — D509 Iron deficiency anemia, unspecified: Secondary | ICD-10-CM | POA: Diagnosis not present

## 2020-06-20 DIAGNOSIS — I69398 Other sequelae of cerebral infarction: Secondary | ICD-10-CM | POA: Diagnosis not present

## 2020-07-13 ENCOUNTER — Ambulatory Visit (INDEPENDENT_AMBULATORY_CARE_PROVIDER_SITE_OTHER): Payer: Medicare Other

## 2020-07-13 DIAGNOSIS — I214 Non-ST elevation (NSTEMI) myocardial infarction: Secondary | ICD-10-CM | POA: Diagnosis not present

## 2020-07-13 DIAGNOSIS — I639 Cerebral infarction, unspecified: Secondary | ICD-10-CM

## 2020-07-13 DIAGNOSIS — I5021 Acute systolic (congestive) heart failure: Secondary | ICD-10-CM | POA: Diagnosis not present

## 2020-07-13 DIAGNOSIS — I480 Paroxysmal atrial fibrillation: Secondary | ICD-10-CM | POA: Diagnosis not present

## 2020-07-13 LAB — ECHOCARDIOGRAM COMPLETE
AR max vel: 1.31 cm2
AV Area VTI: 1.18 cm2
AV Area mean vel: 1.05 cm2
AV Mean grad: 16 mmHg
AV Peak grad: 22.4 mmHg
Ao pk vel: 2.37 m/s
Area-P 1/2: 3.09 cm2
Calc EF: 62.8 %
MV M vel: 5.14 m/s
MV Peak grad: 105.8 mmHg
S' Lateral: 3.16 cm
Single Plane A2C EF: 67.7 %
Single Plane A4C EF: 56.3 %

## 2020-07-14 ENCOUNTER — Telehealth: Payer: Self-pay | Admitting: *Deleted

## 2020-07-14 NOTE — Telephone Encounter (Signed)
Laurine Blazer, LPN  03/83/3383 2:91 PM EDT Back to Top    Notified, copy to pcp.

## 2020-07-14 NOTE — Telephone Encounter (Signed)
-----   Message from Verta Ellen., NP sent at 07/13/2020 11:20 PM EDT ----- Please call the patient and let him know the echocardiogram shoed he had good pumping function of the heart. Has a mildly leaking mitral valve. Not uncommon with aging. Aortic valve is moderately narrowed . We will need to watch the aortic valve a little closer and may do an echo every 6 months to watch it. Educate him on symptoms of worsening aortic stenosis including increased dyspnea, dizziness, near syncope, or syncope. Possible chest pain. He may not be experiencing any of these symptoms now. This is only to inform him going forward of things to look for if the stenosis gets worse over time. Thanks

## 2020-07-15 ENCOUNTER — Other Ambulatory Visit: Payer: Self-pay | Admitting: *Deleted

## 2020-07-15 DIAGNOSIS — E1169 Type 2 diabetes mellitus with other specified complication: Secondary | ICD-10-CM

## 2020-07-15 DIAGNOSIS — E1122 Type 2 diabetes mellitus with diabetic chronic kidney disease: Secondary | ICD-10-CM

## 2020-07-15 MED ORDER — ATORVASTATIN CALCIUM 80 MG PO TABS: 80.0000 mg | ORAL_TABLET | Freq: Every day | ORAL | 1 refills | Status: DC

## 2020-07-15 MED ORDER — APIXABAN 5 MG PO TABS
5.0000 mg | ORAL_TABLET | Freq: Two times a day (BID) | ORAL | 1 refills | Status: DC
Start: 2020-07-15 — End: 2020-09-02

## 2020-07-26 ENCOUNTER — Telehealth: Payer: Self-pay | Admitting: *Deleted

## 2020-07-26 NOTE — Telephone Encounter (Signed)
-----   Message from Verta Ellen., NP sent at 07/26/2020  1:26 PM EDT ----- Please call the patient and let him know the monitor did not show any significant arrhythmias.

## 2020-07-26 NOTE — Telephone Encounter (Signed)
Laurine Blazer, LPN  47/01/8306 4:60 PM EDT Back to Top    Notified, copy to pcp.

## 2020-07-31 NOTE — Progress Notes (Signed)
Cardiology Office Note  Date: 08/01/2020   ID: Connor Bryant, DOB 06/11/1946, MRN [id number]  PCP:  Claretta Fraise, MD  Cardiologist:  Carlyle Dolly, MD Electrophysiologist:  None   Chief Complaint: Follow-up hypertension, CVA, cardiomyopathy,  History of Present Illness: Connor Bryant is a 74 y.o. male with a history of recent iliac stents, diabetes, hypertension. Other history includes PVD, HTN, DM 2, HLD, PAF, acute respiratory failure with hypoxia, NSTEMI, acute systolic heart failure.  History of PE noted on CT 2015.  CKD stage III, COPD with a long history of tobacco abuse.  PVD: status post mesenteric artery bypass with recent bilateral iliac stenting at [facility] 04/27/2020.  Anemia: Chronic iron deficiency anemia noted.  Recent hospital admission.  Presented with complaints to the emergency room on May 01, 2020 of losing vision in his left eye.  Started the prior day.  Stated he woke up Saturday morning and had loss of vision in the left side.  Stated it felt like a dark shadow on the peripheray of his left eye.  Reported some tingling in his left arm which had resolved.  Denied any other symptoms of CVA.  Also had elevated troponins.  Classified as NSTEMI.  Troponin peaked at 2500.  Was not a candidate for emergent catheterization at that time.  Discharge summary stated may reflect stress-induced cardiomyopathy versus MI.  Case discussed with cardiology.  Plans for cardiac catheterization in the outpatient setting were discussed.  Acute kidney injury on CKD stage III.  Echocardiogram showed LVEF of 35% and apical akinesis.  Baseline creatinine was 1.2-1.3.  Creatinine peaked at 2.2 which was likely related to recent diuresis and IV contrast.  ARB was being held.  Noted to have probable PAF.  He was started on beta-blocker by cardiology 25 mg 3 times daily.  This was increased to 37.5 3 times daily.  Subsequently transitioned to Toprol.  Anticoagulation with Eliquis.  Patient was  noted to have left lower lobe pneumonia and treated with cefepime.   He was here last visit for hospital follow-up 05/31/2020.  He stated the only issue he was having was blurred peripheral vision. No other focal neurologic deficits.  Denies any slurred speech, unilateral weakness, he states he is feeling better.  He denies any anginal or exertional symptoms.  Stated he was working with PT and having very slight dyspnea when walking with PT at home.  Recent follow-up labs showed BNP of three 3.4, glucose 168, creatinine 1.77, GFR 37, hemoglobin 9.8, hematocrit 31.4, TSH 4.5, free T4 1.23.  He denies any sensation of arrhythmias/palpitations.  No orthostatic symptoms, no bleeding.  No PND or orthopnea.  No claudication-like symptoms, DVT or PE-like symptoms, or lower extremity edema.  His blood pressure was elevated at 190/80.   Last seen May 31, 2020.  Denies any recent issues since last visit.  Blood pressure is elevated on arrival at 184/76.  Recheck in right arm 160/90.  His nephrologist recently started him on lisinopril 2.5 mg daily.  Denies any recent anginal or exertional symptoms.  Continues to complain of left decreased peripheral vision secondary to recent CVA.  Denies any orthostatic symptoms, new CVA or TIA-like symptoms, orthostatic symptoms, PND, orthopnea.  States he has been walking some at a nearby track.  He has been compliant with all of his antihypertensive medications.  His recent repeat echo showed return of EF back to normal at 60 to 65%,  Mild MR and moderate AR.  Past Medical  History:  Diagnosis Date  . Acute respiratory failure with hypoxia ([place]) 05/04/2020  . Arthritis   . DM type 2 (diabetes mellitus, type 2) (HCC)    no meds  . Endotracheally intubated   . Gallstone   . Heart murmur    was told one time he had a murmur, not since 30 years ago  . HTN (hypertension)   . Hyperlipidemia   . Personal history of colonic polyp-adenoma 11/24/2013  . Renal insufficiency     mild  . Thoracic spine fracture [facility])     Past Surgical History:  Procedure Laterality Date  . ABDOMINAL AORTAGRAM N/A 02/23/2014   Procedure: ABDOMINAL [name];  Surgeon: Conrad Villanueva, MD;  Location: [facility] CATH LAB;  Service: Cardiovascular;  Laterality: N/A;  . COLONOSCOPY    . DEBRIDEMENT TENNIS ELBOW Right   . ESOPHAGOGASTRODUODENOSCOPY    . MESENTERIC ARTERY BYPASS N/A 03/10/2014   Procedure: AORTO TO SUPERIOR MESENTERIC ARTERY BYPASS;  Surgeon: [name], MD;  Location: [place];  Service: Vascular;  Laterality: N/A;  . TONSILLECTOMY AND ADENOIDECTOMY    . vascualr surgery     illiac stents    Current Outpatient Medications  Medication Sig Dispense Refill  . allopurinol (ZYLOPRIM) 100 MG tablet TAKE 1 TABLET 2 TO 3 TIMES A DAY FOR GOUT 90 tablet 3  . apixaban (ELIQUIS) 5 MG TABS tablet Take 1 tablet (5 mg total) by mouth 2 (two) times daily. 60 tablet 1  . atorvastatin (LIPITOR) 80 MG tablet Take 1 tablet (80 mg total) by mouth daily. 30 tablet 1  . carvedilol (COREG) 12.5 MG tablet Take 1 tablet (12.5 mg total) by mouth 2 (two) times daily. 180 tablet 3  . clopidogrel (PLAVIX) 75 MG tablet Take 75 mg by mouth daily.    . furosemide (LASIX) 40 MG tablet Take 20 mg by mouth daily.    Marland Kitchen glimepiride (AMARYL) 2 MG tablet Take 1 tablet (2 mg total) by mouth every morning. 30 tablet 11  . hydrALAZINE (APRESOLINE) 50 MG tablet Take 1 tablet (50 mg total) by mouth 3 (three) times daily. 270 tablet 0  . isosorbide mononitrate (IMDUR) 30 MG 24 hr tablet Take 1 tablet (30 mg total) by mouth daily. 90 tablet 2  . lisinopril (ZESTRIL) 2.5 MG tablet Take 2.5 mg by mouth daily.    Marland Kitchen loratadine (CLARITIN) 10 MG tablet Take 10 mg by mouth daily.    . polyethylene glycol (MIRALAX / GLYCOLAX) packet Take 17 g by mouth daily. (Patient taking differently: Take 8.5 g by mouth daily. ) 30 packet 1   No current facility-administered medications for this visit.   Allergies:  Patient has no known  allergies.   Social History: The patient  reports that he quit smoking about 11 years ago. His smoking use included cigarettes. He has never used smokeless tobacco. He reports current alcohol use of about 12.0 standard drinks of alcohol per week. He reports previous drug use.   Family History: The patient's family history includes AAA (abdominal aortic aneurysm) in his mother; Bladder Cancer in his father; Cancer in his father; Coronary artery disease in his father; Diabetes in his father and mother; Heart attack in his father; Heart disease in his father and mother; Hyperlipidemia in his father and mother; Hypertension in his father and mother.   ROS:  Please see the history of present illness. Otherwise, complete review of systems is positive for none.  All other systems are reviewed and negative.  Physical Exam: VS:  BP (!) 184/76   Pulse 74   Ht 5\' 8"  (1.727 m)   Wt 210 lb 6.4 oz (95.4 kg)   SpO2 98%   BMI 31.99 kg/m , BMI Body mass index is 31.99 kg/m.  Wt Readings from Last 3 Encounters:  08/01/20 210 lb 6.4 oz (95.4 kg)  05/31/20 214 lb (97.1 kg)  05/27/20 211 lb 9.6 oz (96 kg)    General: Patient appears comfortable at rest. Neck: Supple, no elevated JVP or carotid bruits, no thyromegaly. Lungs: Clear to auscultation, nonlabored breathing at rest. Cardiac: Regular rate and rhythm, no S3 or significant systolic murmur, no pericardial rub. Extremities: No pitting edema, distal pulses 2+. Skin: Warm and dry. Musculoskeletal: No kyphosis. Neuropsychiatric: Alert and oriented x3, affect grossly appropriate.  ECG:  EKG May 13, 2020 showed sinus tachycardia with a rate of 119, rightward axis, septal infarct, age undetermined.  Recent Labwork: 05/11/2020: Magnesium 1.8 05/27/2020: ALT 16; AST 22; BNP 383.4; BUN 19; Creatinine, Ser 1.77; Hemoglobin 9.8; Platelets 218; Potassium 3.9; Sodium 141; TSH 4.520     Component Value Date/Time   CHOL 137 05/23/2020 1009   TRIG 103  05/23/2020 1009   HDL 53 05/23/2020 1009   CHOLHDL 2.6 05/23/2020 1009   CHOLHDL 2.2 05/02/2020 0400   VLDL 14 05/02/2020 0400   LDLCALC 65 05/23/2020 1009    Other Studies Reviewed Today:  Echocardiogram 07/31/2020  1. Left ventricular ejection fraction, by estimation, is 60 to 65%. The left ventricle has normal function. The left ventricle has no regional wall motion abnormalities. Left ventricular diastolic parameters are indeterminate. 2. Right ventricular systolic function is normal. The right ventricular size is normal. There is normal pulmonary artery systolic pressure. The estimated right ventricular systolic pressure is 02.5 mmHg. 3. Left atrial size was mildly dilated. 4. The mitral valve is grossly normal. Mild mitral valve regurgitation. 5. The aortic valve is tricuspid. There is moderate calcification of the aortic valve. Aortic valve regurgitation is not visualized. Moderate aortic valve stenosis. Aortic valve area, by VTI measures 1.18 cm. Aortic valve mean gradient measures 16.0 mmHg. Dimentionless index 0.41. 6. The inferior vena cava is normal in size with greater than 50% respiratory variability, suggesting right atrial pressure of 3 mmHg.   Cardiac monitor 06/09/2020 Study Highlights   30 day event monitor  Min HR 54, Max HR 140, Avg HR 70  No symptoms reported  Telemetry tracings show sinus rhythm, rare PVCs and PACs  No significant arrhythmias    MRI angio neck and brain without contrast 05/02/2020 IMPRESSION: 1. Acute right occipital infarct. 2. Nonvisualization of the proximal right vertebral artery inferior to the level of the glottis, concerning for occlusion. 3. Atheromatous disease with multifocal intracranial stenoses most notably is follows. 4. Short-segment moderate to severe proximal right and distal bilateral intracavernous ICA narrowing. Both of the intracavernous ICAs demonstrate mild short-segment pre stenotic dilatation. 5.  Moderate to severe short segment narrowing of the distal left ICA cervical segment. 6. Approximately 40% luminal narrowing of the proximal left ICA, distal to the bifurcation. 7. Please note that motion artifact on MRA head and neck imaging limits evaluation.    Limited echocardiogram 05/04/2020 1. The entire apex from mid to distal ventricle is akinetic. Pattern can be consistent with stress induced cardiomyopathy/Takotsubo CM, however cannot exclude ischemic etiology . Left ventricular ejection fraction, by estimation, is 35%. The left ventricle has moderately decreased function. 2. The inferior vena cava is normal in size with  greater than 50% respiratory variability, suggesting right atrial pressure of 3 mmHg.  Echocardiogram 05/02/2020  1. Left ventricular ejection fraction, by estimation, is 60 to 65%. The left ventricle has normal function. Left ventricular endocardial border not optimally defined to evaluate regional wall motion. The left ventricular internal cavity size was mildly dilated. There is mild left ventricular hypertrophy. Left ventricular diastolic parameters are indeterminate. 2. Right ventricular systolic function is normal. The right ventricular size is normal. 3. The mitral valve is abnormal. Trivial mitral valve regurgitation. 4. AV is thickened, calcified with mildly restricted motion. Peak and mean gradients through the valve are 31 and 18 mm Hg respectively. . The aortic valve is abnormal. Aortic valve regurgitation is not visualized.   Peripheral Angiography: 04/27/2020 PREOPERATIVE DIAGNOSES: 1. Atherosclerosis of extremities with lifestyle-limiting claudication. 2. Previous aortomesenteric bypass.  POSTOPERATIVE DIAGNOSES: 1. Atherosclerosis of extremities with lifestyle-limiting claudication. 2. Previous aortomesenteric bypass.  PROCEDURES: 1. Revascularization of right common iliac artery (10 x 29 and 10 x 39 mm Omnilink bare-metal balloon  mounted stent). 2. Revascularization of right external iliac artery (8 mm standard balloon angioplasty). 3. Revascularization of left external iliac artery (8 mm standard balloon angioplasty). 4. Mynx closure of bilateral common femoral arteries.    Assessment and Plan:   1. Acute CVA (cerebrovascular accident) [facility]) Recent presentation with loss of vision to left eye, specifically left peripheral vision.  Neuroimaging revealed right occipital infarct.  He was started on Eliquis with recommendation to continue Plavix by vascular surgeon Dr. Rosana Hoes at [facility].  Recent cardiac monitor showed no significant arrhythmias.  Continue Plavix 75 mg daily as well as Eliquis 5 mg p.o. twice daily.  2. Acute systolic heart failure (HCC) Recent follow-up echocardiogram showed significantly improved EF at 60 to 65% with mild MR and moderate aortic stenosis.  Continue Coreg to 12.5 mg p.o. twice daily.  Continue Lasix 20 mg daily.  Continue Imdur 30 mg daily.  3. Non-ST elevation (NSTEMI) myocardial infarction [facility]) Diagnosis of NSTEMI at recent admission with elevated troponin peak of 2500.  Repeat echocardiogram showed return of EF back to normal at 60 to 65% on 07/31/2020.  He denies any anginal or exertional symptoms.  He does have moderate aortic stenosis.  Continue Imdur 30 mg daily.  Carvedilol 12.5 mg p.o. twice daily.  Plavix 75 mg daily.   4. PAF (paroxysmal atrial fibrillation) (HCC) Recent cardiac monitor showed no evidence of arrhythmias.  No evidence of atrial fibrillation.  Continue Coreg to 12.5 mg p.o. twice daily.  Continue Eliquis 5 mg p.o. twice daily.  5.  PAD Patient had recent bilateral iliac stents at [facility], 2 on the right and 1 on the left.  History of mesenteric artery bypass.  Denies any current claudication-like symptoms.   6.  Essential hypertension Blood pressure today 184/76.  Recheck in right arm 160/90.  Nephrology recently started patient  on lisinopril 2.5 mg daily.  Increase lisinopril to 5 mg p.o. daily.  Start checking blood pressures daily after increase.  Call with blood pressures in 2 weeks.  We may need to uptitrate the lisinopril and/or carvedilol if blood pressure not at goal.  Continue hydralazine 50 mg p.o. 3 times daily.  Continue carvedilol to 12.5 mg p.o. twice daily.  Medication Adjustments/Labs and Tests Ordered: Current medicines are reviewed at length with the patient today.  Concerns regarding medicines are outlined above.   Disposition: Follow-up with Dr. Harl Bowie or APP 3 months Signed, Levell July, NP  08/01/2020 1:23 PM    [facility] [place], [place], [place] [address] Phone: [phone]; Fax: [phone]

## 2020-08-01 ENCOUNTER — Encounter: Payer: Self-pay | Admitting: Family Medicine

## 2020-08-01 ENCOUNTER — Ambulatory Visit: Payer: Medicare Other | Admitting: Family Medicine

## 2020-08-01 VITALS — BP 184/76 | HR 74 | Ht 68.0 in | Wt 210.4 lb

## 2020-08-01 DIAGNOSIS — I480 Paroxysmal atrial fibrillation: Secondary | ICD-10-CM | POA: Diagnosis not present

## 2020-08-01 DIAGNOSIS — I214 Non-ST elevation (NSTEMI) myocardial infarction: Secondary | ICD-10-CM

## 2020-08-01 DIAGNOSIS — I10 Essential (primary) hypertension: Secondary | ICD-10-CM

## 2020-08-01 DIAGNOSIS — I5021 Acute systolic (congestive) heart failure: Secondary | ICD-10-CM

## 2020-08-01 DIAGNOSIS — I739 Peripheral vascular disease, unspecified: Secondary | ICD-10-CM | POA: Diagnosis not present

## 2020-08-01 DIAGNOSIS — I639 Cerebral infarction, unspecified: Secondary | ICD-10-CM | POA: Diagnosis not present

## 2020-08-01 MED ORDER — LISINOPRIL 5 MG PO TABS: 5.0000 mg | ORAL_TABLET | Freq: Every day | ORAL | 1 refills | Status: DC

## 2020-08-01 NOTE — Patient Instructions (Addendum)
Medication Instructions:   Your physician has recommended you make the following change in your medication:   Increase lisinopril to 5 mg by mouth daily. You may take (2) of your 2.5 mg tablets daily until finished  Continue other medications the same  Labwork:  None  Testing/Procedures:  None  Follow-Up:  Your physician recommends that you schedule a follow-up appointment in: 3 months with Dr. Harl Bowie.  Any Other Special Instructions Will Be Listed Below (If Applicable). Your physician has requested that you regularly monitor and record your blood pressure readings at home. Please use the same machine at the same time of day to check your readings and record them to bring to your follow-up visit.  Call office in 2 weeks with your readings  If you need a refill on your cardiac medications before your next appointment, please call your pharmacy.

## 2020-08-30 ENCOUNTER — Ambulatory Visit (INDEPENDENT_AMBULATORY_CARE_PROVIDER_SITE_OTHER): Payer: Medicare Other | Admitting: Family Medicine

## 2020-08-30 ENCOUNTER — Encounter: Payer: Self-pay | Admitting: Family Medicine

## 2020-08-30 ENCOUNTER — Other Ambulatory Visit: Payer: Self-pay

## 2020-08-30 VITALS — BP 200/91 | HR 74 | Temp 97.8°F | Resp 20 | Ht 68.0 in | Wt 207.0 lb

## 2020-08-30 DIAGNOSIS — E7800 Pure hypercholesterolemia, unspecified: Secondary | ICD-10-CM | POA: Diagnosis not present

## 2020-08-30 DIAGNOSIS — R7989 Other specified abnormal findings of blood chemistry: Secondary | ICD-10-CM | POA: Diagnosis not present

## 2020-08-30 DIAGNOSIS — E1122 Type 2 diabetes mellitus with diabetic chronic kidney disease: Secondary | ICD-10-CM | POA: Diagnosis not present

## 2020-08-30 DIAGNOSIS — Z23 Encounter for immunization: Secondary | ICD-10-CM | POA: Diagnosis not present

## 2020-08-30 DIAGNOSIS — I10 Essential (primary) hypertension: Secondary | ICD-10-CM

## 2020-08-30 DIAGNOSIS — E1169 Type 2 diabetes mellitus with other specified complication: Secondary | ICD-10-CM

## 2020-08-30 DIAGNOSIS — E756 Lipid storage disorder, unspecified: Secondary | ICD-10-CM

## 2020-08-30 LAB — BAYER DCA HB A1C WAIVED: HB A1C (BAYER DCA - WAIVED): 6 % (ref <7.0)

## 2020-08-31 LAB — CMP14+EGFR
ALT: 21 IU/L (ref 0–44)
AST: 21 IU/L (ref 0–40)
Albumin/Globulin Ratio: 1.8 (ref 1.2–2.2)
Albumin: 4.6 g/dL (ref 3.7–4.7)
Alkaline Phosphatase: 131 IU/L — ABNORMAL HIGH (ref 44–121)
BUN/Creatinine Ratio: 16 (ref 10–24)
BUN: 26 mg/dL (ref 8–27)
Bilirubin Total: 0.4 mg/dL (ref 0.0–1.2)
CO2: 21 mmol/L (ref 20–29)
Calcium: 9.5 mg/dL (ref 8.6–10.2)
Chloride: 106 mmol/L (ref 96–106)
Creatinine, Ser: 1.59 mg/dL — ABNORMAL HIGH (ref 0.76–1.27)
GFR calc Af Amer: 48 mL/min/{1.73_m2} — ABNORMAL LOW (ref >59)
GFR calc non Af Amer: 42 mL/min/{1.73_m2} — ABNORMAL LOW (ref >59)
Globulin, Total: 2.6 g/dL (ref 1.5–4.5)
Glucose: 102 mg/dL — ABNORMAL HIGH (ref 65–99)
Potassium: 4.2 mmol/L (ref 3.5–5.2)
Sodium: 144 mmol/L (ref 134–144)
Total Protein: 7.2 g/dL (ref 6.0–8.5)

## 2020-08-31 LAB — THYROID PANEL WITH TSH
Free Thyroxine Index: 1.8 (ref 1.2–4.9)
T3 Uptake Ratio: 30 % (ref 24–39)
T4, Total: 6.1 ug/dL (ref 4.5–12.0)
TSH: 3.03 u[IU]/mL (ref 0.450–4.500)

## 2020-08-31 LAB — CBC WITH DIFFERENTIAL/PLATELET
Basophils Absolute: 0 10*3/uL (ref 0.0–0.2)
Basos: 1 %
EOS (ABSOLUTE): 0.3 10*3/uL (ref 0.0–0.4)
Eos: 4 %
Hematocrit: 38.4 % (ref 37.5–51.0)
Hemoglobin: 12.6 g/dL — ABNORMAL LOW (ref 13.0–17.7)
Immature Grans (Abs): 0 10*3/uL (ref 0.0–0.1)
Immature Granulocytes: 0 %
Lymphocytes Absolute: 1.8 10*3/uL (ref 0.7–3.1)
Lymphs: 23 %
MCH: 28.8 pg (ref 26.6–33.0)
MCHC: 32.8 g/dL (ref 31.5–35.7)
MCV: 88 fL (ref 79–97)
Monocytes Absolute: 0.7 10*3/uL (ref 0.1–0.9)
Monocytes: 8 %
Neutrophils Absolute: 4.9 10*3/uL (ref 1.4–7.0)
Neutrophils: 64 %
Platelets: 221 10*3/uL (ref 150–450)
RBC: 4.37 x10E6/uL (ref 4.14–5.80)
RDW: 15 % (ref 11.6–15.4)
WBC: 7.8 10*3/uL (ref 3.4–10.8)

## 2020-09-02 ENCOUNTER — Encounter: Payer: Self-pay | Admitting: Family Medicine

## 2020-09-02 MED ORDER — HYDRALAZINE HCL 50 MG PO TABS: 50.0000 mg | ORAL_TABLET | Freq: Three times a day (TID) | ORAL | 1 refills | Status: DC

## 2020-09-02 MED ORDER — APIXABAN 5 MG PO TABS
5.0000 mg | ORAL_TABLET | Freq: Two times a day (BID) | ORAL | 1 refills | Status: DC
Start: 2020-09-02 — End: 2020-11-09

## 2020-09-02 MED ORDER — ATORVASTATIN CALCIUM 80 MG PO TABS: 80.0000 mg | ORAL_TABLET | Freq: Every day | ORAL | 1 refills | Status: DC

## 2020-09-02 NOTE — Progress Notes (Signed)
Subjective:  Patient ID: Connor Bryant, male    DOB: [birth year]  Age: 74 y.o. MRN: [id number]  CC: Medical Management of Chronic Issues   HPI Connor Bryant presents forFollow-up of diabetes. Patient not checking blood sugar at home.  Patient denies symptoms such as polyuria, polydipsia, excessive hunger, nausea No significant hypoglycemic spells noted. Medications reviewed. Pt reports taking them regularly without complication/adverse reaction being reported today.    presents for  follow-up of hypertension. Patient has no history of headache chest pain or shortness of breath or recent cough. Patient also denies symptoms of TIA such as focal numbness or weakness. Patient denies side effects from medication. States taking it regularly.Runs 120/60. He is losing weight as a result of decreasing alcohol consumption.    History Connor Bryant has a past medical history of Acute respiratory failure with hypoxia ([place]) (05/04/2020), Arthritis, DM type 2 (diabetes mellitus, type 2) ([place]), Endotracheally intubated, Gallstone, Heart murmur, HTN (hypertension), Hyperlipidemia, Personal history of colonic polyp-adenoma (11/24/2013), Renal insufficiency, and Thoracic spine fracture ([place]).   He has a past surgical history that includes Debridement tennis elbow (Right); Tonsillectomy and adenoidectomy; Colonoscopy; Esophagogastroduodenoscopy; Mesenteric artery bypass (N/A, 03/10/2014); abdominal aortagram (N/A, 02/23/2014); and vascualr surgery.   His family history includes AAA (abdominal aortic aneurysm) in his mother; Bladder Cancer in his father; Cancer in his father; Coronary artery disease in his father; Diabetes in his father and mother; Heart attack in his father; Heart disease in his father and mother; Hyperlipidemia in his father and mother; Hypertension in his father and mother.He reports that he quit smoking about 11 years ago. His smoking use included cigarettes. He has never used smokeless tobacco. He  reports current alcohol use of about 12.0 standard drinks of alcohol per week. He reports previous drug use.  Current Outpatient Medications on File Prior to Visit  Medication Sig Dispense Refill  . allopurinol (ZYLOPRIM) 100 MG tablet TAKE 1 TABLET 2 TO 3 TIMES A DAY FOR GOUT 90 tablet 3  . carvedilol (COREG) 12.5 MG tablet Take 1 tablet (12.5 mg total) by mouth 2 (two) times daily. 180 tablet 3  . clopidogrel (PLAVIX) 75 MG tablet Take 75 mg by mouth daily.    . furosemide (LASIX) 40 MG tablet Take 20 mg by mouth daily.    Marland Kitchen glimepiride (AMARYL) 2 MG tablet Take 1 tablet (2 mg total) by mouth every morning. 30 tablet 11  . isosorbide mononitrate (IMDUR) 30 MG 24 hr tablet Take 1 tablet (30 mg total) by mouth daily. 90 tablet 2  . lisinopril (ZESTRIL) 5 MG tablet Take 1 tablet (5 mg total) by mouth daily. 90 tablet 1  . loratadine (CLARITIN) 10 MG tablet Take 10 mg by mouth daily.    . polyethylene glycol (MIRALAX / GLYCOLAX) packet Take 17 g by mouth daily. (Patient taking differently: Take 8.5 g by mouth daily. ) 30 packet 1   No current facility-administered medications on file prior to visit.    ROS Review of Systems  Constitutional: Negative for fever.  Respiratory: Negative for shortness of breath.   Cardiovascular: Negative for chest pain.  Musculoskeletal: Negative for arthralgias.  Skin: Negative for rash.    Objective:  BP (!) 200/91   Pulse 74   Temp 97.8 F (36.6 C) (Temporal)   Resp 20   Ht _0  (1.727 m)   Wt 207 lb (93.9 kg)   SpO2 98%   BMI 31.47 kg/m   BP Readings from Last 3 Encounters:  08/30/20 (!) 200/91  08/01/20 (!) 184/76  05/31/20 (!) 190/80    Wt Readings from Last 3 Encounters:  08/30/20 207 lb (93.9 kg)  08/01/20 210 lb 6.4 oz (95.4 kg)  05/31/20 214 lb (97.1 kg)     Physical Exam Cardiovascular:     Rate and Rhythm: Normal rate and regular rhythm.     Heart sounds: Murmur heard.         Assessment & Plan:   Connor Bryant was seen  today for medical management of chronic issues.  Diagnoses and all orders for this visit:  Type 2 diabetes mellitus with chronic kidney disease, without long-term current use of insulin, unspecified CKD stage (HCC) -     Bayer DCA Hb A1c Waived -     CBC with Differential/Platelet -     CMP14+EGFR -     Thyroid Panel With TSH -     atorvastatin (LIPITOR) 80 MG tablet; Take 1 tablet (80 mg total) by mouth daily.  Essential hypertension -     Bayer DCA Hb A1c Waived -     CBC with Differential/Platelet -     CMP14+EGFR -     Thyroid Panel With TSH -     hydrALAZINE (APRESOLINE) 50 MG tablet; Take 1 tablet (50 mg total) by mouth 3 (three) times daily.  Pure hypercholesterolemia -     Bayer DCA Hb A1c Waived -     CBC with Differential/Platelet -     CMP14+EGFR -     Thyroid Panel With TSH  Elevated TSH -     Bayer DCA Hb A1c Waived -     CBC with Differential/Platelet -     CMP14+EGFR -     Thyroid Panel With TSH  Need for immunization against influenza -     Flu Vaccine QUAD High Dose(Fluad)  Diabetic lipidosis (HCC) -     atorvastatin (LIPITOR) 80 MG tablet; Take 1 tablet (80 mg total) by mouth daily.  Other orders -     apixaban (ELIQUIS) 5 MG TABS tablet; Take 1 tablet (5 mg total) by mouth 2 (two) times daily.      I am having Connor Bryant "[name]" maintain his polyethylene glycol, allopurinol, clopidogrel, glimepiride, furosemide, carvedilol, isosorbide mononitrate, loratadine, lisinopril, apixaban, atorvastatin, and hydrALAZINE.  Meds ordered this encounter  Medications  . apixaban (ELIQUIS) 5 MG TABS tablet    Sig: Take 1 tablet (5 mg total) by mouth 2 (two) times daily.    Dispense:  60 tablet    Refill:  1  . atorvastatin (LIPITOR) 80 MG tablet    Sig: Take 1 tablet (80 mg total) by mouth daily.    Dispense:  90 tablet    Refill:  1  . hydrALAZINE (APRESOLINE) 50 MG tablet    Sig: Take 1 tablet (50 mg total) by mouth 3 (three) times daily.    Dispense:   270 tablet    Refill:  1    Put on file until patient calls     Follow-up: Return in about 3 months (around 11/28/2020).  Claretta Fraise, M.D.

## 2020-09-05 DIAGNOSIS — I639 Cerebral infarction, unspecified: Secondary | ICD-10-CM | POA: Diagnosis not present

## 2020-09-05 DIAGNOSIS — I129 Hypertensive chronic kidney disease with stage 1 through stage 4 chronic kidney disease, or unspecified chronic kidney disease: Secondary | ICD-10-CM | POA: Diagnosis not present

## 2020-09-05 DIAGNOSIS — N183 Chronic kidney disease, stage 3 unspecified: Secondary | ICD-10-CM | POA: Diagnosis not present

## 2020-09-05 DIAGNOSIS — I2510 Atherosclerotic heart disease of native coronary artery without angina pectoris: Secondary | ICD-10-CM | POA: Diagnosis not present

## 2020-09-05 DIAGNOSIS — I5020 Unspecified systolic (congestive) heart failure: Secondary | ICD-10-CM | POA: Diagnosis not present

## 2020-09-07 ENCOUNTER — Telehealth: Payer: Self-pay | Admitting: *Deleted

## 2020-09-07 ENCOUNTER — Other Ambulatory Visit: Payer: Self-pay | Admitting: Family Medicine

## 2020-09-07 MED ORDER — FUROSEMIDE 20 MG PO TABS
20.0000 mg | ORAL_TABLET | Freq: Every day | ORAL | 1 refills | Status: DC
Start: 2020-09-07 — End: 2021-03-02

## 2020-09-07 NOTE — Telephone Encounter (Signed)
I ent in an updated scrip. Thanks, WS

## 2020-09-07 NOTE — Telephone Encounter (Signed)
From [place] RE: Furosemide 40 mg 1 QD Pt says this was changed to 20 mg Please advise, historical med. Last Ov 08/30/20 next OV 11/28/20

## 2020-09-22 ENCOUNTER — Other Ambulatory Visit: Payer: Self-pay

## 2020-09-22 ENCOUNTER — Other Ambulatory Visit: Payer: Medicare Other

## 2020-09-22 DIAGNOSIS — N183 Chronic kidney disease, stage 3 unspecified: Secondary | ICD-10-CM | POA: Diagnosis not present

## 2020-10-18 ENCOUNTER — Other Ambulatory Visit: Payer: Self-pay | Admitting: *Deleted

## 2020-10-18 MED ORDER — LORATADINE 10 MG PO TABS
10.0000 mg | ORAL_TABLET | Freq: Every day | ORAL | 3 refills | Status: DC
Start: 2020-10-18 — End: 2020-11-28

## 2020-10-18 NOTE — Telephone Encounter (Signed)
Pharmacy is checking on loratadine rx

## 2020-11-09 ENCOUNTER — Other Ambulatory Visit: Payer: Self-pay | Admitting: Family Medicine

## 2020-11-16 ENCOUNTER — Ambulatory Visit: Payer: Medicare Other | Admitting: Cardiology

## 2020-11-16 ENCOUNTER — Encounter: Payer: Self-pay | Admitting: Cardiology

## 2020-11-16 VITALS — BP 212/80 | HR 58 | Ht 68.0 in | Wt 209.6 lb

## 2020-11-16 DIAGNOSIS — I214 Non-ST elevation (NSTEMI) myocardial infarction: Secondary | ICD-10-CM

## 2020-11-16 DIAGNOSIS — I10 Essential (primary) hypertension: Secondary | ICD-10-CM

## 2020-11-16 DIAGNOSIS — I480 Paroxysmal atrial fibrillation: Secondary | ICD-10-CM | POA: Diagnosis not present

## 2020-11-16 DIAGNOSIS — Z8679 Personal history of other diseases of the circulatory system: Secondary | ICD-10-CM | POA: Diagnosis not present

## 2020-11-16 MED ORDER — CLOPIDOGREL BISULFATE 75 MG PO TABS: 75.0000 mg | ORAL_TABLET | Freq: Every day | ORAL | 1 refills | Status: DC

## 2020-11-16 NOTE — Progress Notes (Signed)
Clinical Summary Connor Bryant is a 75 y.o.male seen today for follow up of the following medical problems.     1. NSTEMI - NSTEMI druing admission where he presented with CVA initially - peak trop 2500  - 04/2020 echo LVEF 35% (acute change from 05/02/20 echo) with apical akinesis.  - possible acute plaque rupture vs coronary embolic event given presented with new onset afib and stroke vs. Stress induced CM/Takotsubo CM given echo findings - admission complicated by PE, hematuria, anemia, AKI given recent stroke and respiratory failure on ventilator did not pursue emergent cath  - no recent chest pains.   2. Chronic sysotlic HF - new diagnosis during admission with CVA - - 04/2020 echo LVEF 35% (acute change from 05/02/20 echo) with apical akinesis.  - cath not pursued at that time due to CVA. Admission also complicated with PE, hematuria, anemia, AKI   06/2020 echo LVEF 60-65%, no WMAs - clincal scenario would suggest this may have been a stress induced CM in setting of his CVA - no recent SOB/DOE,no recent edema    2. PAF - new diagnosis during admission with CVA - 07/2020 monitor without recurrent afib - no bleeding on eliquis - rare palpitations.    3. CVA - per pcp   4. PE - has been on eliquis  5. Anemia   6. PAD  s/p prior mesenteric artery bypass and recent bilateral iliac stenting at [facility] on 04/27/2020 - he stopped his plavix.   7. Aortic stenosis - mild to moderate by 06/2020 echo, AVA VTI 1.18 and mean grad 16.   8. HTN - home bp's 130s/60s - he reports significant white coat HTN.   9. Carotid stenosis - noted on MRA - on medical therapy.  Past Medical History:  Diagnosis Date  . Acute respiratory failure with hypoxia ([place]) 05/04/2020  . Arthritis   . DM type 2 (diabetes mellitus, type 2) (HCC)    no meds  . Endotracheally intubated   . Gallstone   . Heart murmur    was told one time he had a murmur, not since 30 years ago  . HTN  (hypertension)   . Hyperlipidemia   . Personal history of colonic polyp-adenoma 11/24/2013  . Renal insufficiency    mild  . Thoracic spine fracture (HCC)      No Known Allergies   Current Outpatient Medications  Medication Sig Dispense Refill  . allopurinol (ZYLOPRIM) 100 MG tablet TAKE 1 TABLET 2 TO 3 TIMES A DAY FOR GOUT 90 tablet 3  . atorvastatin (LIPITOR) 80 MG tablet Take 1 tablet (80 mg total) by mouth daily. 90 tablet 1  . carvedilol (COREG) 12.5 MG tablet Take 1 tablet (12.5 mg total) by mouth 2 (two) times daily. 180 tablet 3  . clopidogrel (PLAVIX) 75 MG tablet Take 75 mg by mouth daily.    Marland Kitchen ELIQUIS 5 MG TABS tablet TAKE 1 TABLET 2 TIMES A DAY 60 tablet 0  . furosemide (LASIX) 20 MG tablet Take 1 tablet (20 mg total) by mouth daily. 90 tablet 1  . glimepiride (AMARYL) 2 MG tablet Take 1 tablet (2 mg total) by mouth every morning. 30 tablet 11  . hydrALAZINE (APRESOLINE) 50 MG tablet Take 1 tablet (50 mg total) by mouth 3 (three) times daily. 270 tablet 1  . isosorbide mononitrate (IMDUR) 30 MG 24 hr tablet Take 1 tablet (30 mg total) by mouth daily. 90 tablet 2  . lisinopril (ZESTRIL) 5  MG tablet Take 1 tablet (5 mg total) by mouth daily. 90 tablet 1  . loratadine (CLARITIN) 10 MG tablet Take 1 tablet (10 mg total) by mouth daily. 90 tablet 3  . polyethylene glycol (MIRALAX / GLYCOLAX) packet Take 17 g by mouth daily. (Patient taking differently: Take 8.5 g by mouth daily. ) 30 packet 1   No current facility-administered medications for this visit.     Past Surgical History:  Procedure Laterality Date  . ABDOMINAL AORTAGRAM N/A 02/23/2014   Procedure: ABDOMINAL [name];  Surgeon: Conrad Casa [place], MD;  Location: [facility] CATH LAB;  Service: Cardiovascular;  Laterality: N/A;  . COLONOSCOPY    . DEBRIDEMENT TENNIS ELBOW Right   . ESOPHAGOGASTRODUODENOSCOPY    . MESENTERIC ARTERY BYPASS N/A 03/10/2014   Procedure: AORTO TO SUPERIOR MESENTERIC ARTERY BYPASS;  Surgeon: [name], MD;  Location: [facility];  Service: Vascular;  Laterality: N/A;  . TONSILLECTOMY AND ADENOIDECTOMY    . vascualr surgery     illiac stents     No Known Allergies    Family History  Problem Relation Age of Onset  . Hypertension Father   . Coronary artery disease Father   . Hyperlipidemia Father   . Diabetes Father   . Bladder Cancer Father   . Cancer Father   . Heart disease Father   . Heart attack Father   . Diabetes Mother   . Heart disease Mother        before age 45  . Hyperlipidemia Mother   . Hypertension Mother   . AAA (abdominal aortic aneurysm) Mother   . Colon cancer Neg Hx   . Throat cancer Neg Hx   . Kidney disease Neg Hx   . Liver disease Neg Hx      Social History Connor Bryant reports that he quit smoking about 11 years ago. His smoking use included cigarettes. He has never used smokeless tobacco. Connor Bryant reports current alcohol use of about 12.0 standard drinks of alcohol per week.   Review of Systems CONSTITUTIONAL: No weight loss, fever, chills, weakness or fatigue.  HEENT: Eyes: No visual loss, blurred vision, double vision or yellow sclerae.No hearing loss, sneezing, congestion, runny nose or sore throat.  SKIN: No rash or itching.  CARDIOVASCULAR: per hpi RESPIRATORY: No shortness of breath, cough or sputum.  GASTROINTESTINAL: No anorexia, nausea, vomiting or diarrhea. No abdominal pain or blood.  GENITOURINARY: No burning on urination, no polyuria NEUROLOGICAL: No headache, dizziness, syncope, paralysis, ataxia, numbness or tingling in the extremities. No change in bowel or bladder control.  MUSCULOSKELETAL: No muscle, back pain, joint pain or stiffness.  LYMPHATICS: No enlarged nodes. No history of splenectomy.  PSYCHIATRIC: No history of depression or anxiety.  ENDOCRINOLOGIC: No reports of sweating, cold or heat intolerance. No polyuria or polydipsia.  Marland Kitchen   Physical Examination Today's Vitals   11/16/20 1340 11/16/20 1351  BP: (!)  210/70 (!) 212/80  Pulse: (!) 58   SpO2: 98%   Weight: 209 lb 9.6 oz (95.1 kg)   Height: 5\' 8"  (1.727 m)    Body mass index is 31.87 kg/m.  Gen: resting comfortably, no acute distress HEENT: no scleral icterus, pupils equal round and reactive, no palptable cervical adenopathy,  CV: RRR, no mr/g, no jvd Resp: Clear to auscultation bilaterally GI: abdomen is soft, non-tender, non-distended, normal bowel sounds, no hepatosplenomegaly MSK: extremities are warm, no edema.  Skin: warm, no rash Neuro:  no focal deficits Psych: appropriate affect  Assessment and Plan  1. NSTEMI - cath not pursued at time of admission given multiple meidcal issues as described above - no recent chest pains, LV dysfunction has completely resolved. May have been secondary to stress induced CM in setting of his CVA. Cannot exclude underlying CAD, or perhaps even embolic event given he presented with onset afib and CVA as well - in absnece of symptoms and now with normalized LVEF would not pursue ischemic testing  2. History of cardiomyopathy - LVEF has normalize - no symptoms - continue current meds  3. HTN  manual recheck 180s/90s, he reports home bp's are 130s/70s - possible white coat HTN, he will take his home cuff and list of bp's with him to pcp visit next week to clarify  4. Afib - no symptoms, continue beta blocker and eliquis  5. PAD - vascular at [facility] had recommended eliquis with plavix, appears plavix has fallen off his list, restart. Recent vascular intervention as reported above      Arnoldo Lenis, M.D..

## 2020-11-16 NOTE — Patient Instructions (Signed)
Your physician wants you to follow-up in: Connor Bryant has recommended you make the following change in your medication:   START PLAVIX 75 MG DAILY   Thank you for choosing [facility]!!

## 2020-11-28 ENCOUNTER — Encounter: Payer: Self-pay | Admitting: Family Medicine

## 2020-11-28 ENCOUNTER — Other Ambulatory Visit: Payer: Self-pay

## 2020-11-28 ENCOUNTER — Ambulatory Visit (INDEPENDENT_AMBULATORY_CARE_PROVIDER_SITE_OTHER): Payer: Medicare Other | Admitting: Family Medicine

## 2020-11-28 VITALS — BP 192/80 | HR 70 | Temp 97.7°F | Ht 68.0 in | Wt 208.2 lb

## 2020-11-28 DIAGNOSIS — E7800 Pure hypercholesterolemia, unspecified: Secondary | ICD-10-CM | POA: Diagnosis not present

## 2020-11-28 DIAGNOSIS — I480 Paroxysmal atrial fibrillation: Secondary | ICD-10-CM

## 2020-11-28 DIAGNOSIS — E1122 Type 2 diabetes mellitus with diabetic chronic kidney disease: Secondary | ICD-10-CM | POA: Diagnosis not present

## 2020-11-28 DIAGNOSIS — Z1159 Encounter for screening for other viral diseases: Secondary | ICD-10-CM | POA: Diagnosis not present

## 2020-11-28 DIAGNOSIS — I10 Essential (primary) hypertension: Secondary | ICD-10-CM

## 2020-11-28 DIAGNOSIS — I700 Atherosclerosis of aorta: Secondary | ICD-10-CM | POA: Diagnosis not present

## 2020-11-28 LAB — BAYER DCA HB A1C WAIVED: HB A1C (BAYER DCA - WAIVED): 6 % (ref <7.0)

## 2020-11-28 MED ORDER — HYDRALAZINE HCL 100 MG PO TABS: 100.0000 mg | ORAL_TABLET | Freq: Three times a day (TID) | ORAL | 1 refills | Status: DC

## 2020-11-28 MED ORDER — LEVOCETIRIZINE DIHYDROCHLORIDE 5 MG PO TABS: 5.0000 mg | ORAL_TABLET | Freq: Every evening | ORAL | 3 refills | Status: DC

## 2020-11-28 NOTE — Progress Notes (Signed)
Subjective:  Patient ID: Connor Bryant, male    DOB: [birth year]  Age: 75 y.o. MRN: [id number]  CC: Medical Management of Chronic Issues   HPI Connor Bryant presents for  follow-up of hypertension. Patient has no history of headache chest pain or shortness of breath or recent cough. Patient also denies symptoms of TIA such as focal numbness or weakness. Patient denies side effects from medication. States taking it regularly.  Pt. Has history of CVA, PE, and NSTEMI at the same time in early august. He also has abdominal aortic atherosclerosis. He is taking statin for prevention.   His atrial fibrillation, PAD, PE treated with eliquis and plavix per Dr. Harl Bowie of cardiology.A. Fib was apparently transient at the time of his recent CVA His LVEF is rated 35% in 8/21 then 60-65% 10/21 upon review of Dr. Nelly Laurence note of 11/16/20. AS a result of PAD he had bilateral iliac stenting 04/27/20  presents forFollow-up of diabetes. Patient doesn't check blood sugar at home.   Patient denies symptoms such as polyuria, polydipsia, excessive hunger, nausea No significant hypoglycemic spells noted. Medications reviewed. Pt reports taking them regularly without complication/adverse reaction being reported today.    History Connor Bryant has a past medical history of Acute CVA (cerebrovascular accident) ([place]) (05/01/2020), Acute respiratory failure with hypoxia ([employer]) (05/04/2020), Arthritis, DM type 2 (diabetes mellitus, type 2) ([place]), Endotracheally intubated, Gallstone, Heart murmur, HTN (hypertension), Hyperlipidemia, Personal history of colonic polyp-adenoma (11/24/2013), Renal insufficiency, and Thoracic spine fracture ([place]).   He has a past surgical history that includes Debridement tennis elbow (Right); Tonsillectomy and adenoidectomy; Colonoscopy; Esophagogastroduodenoscopy; Mesenteric artery bypass (N/A, 03/10/2014); abdominal aortagram (N/A, 02/23/2014); and vascualr surgery.  Per cardiology Dr. Harl Bowie.   His family  history includes AAA (abdominal aortic aneurysm) in his mother; Bladder Cancer in his father; Cancer in his father; Coronary artery disease in his father; Diabetes in his father and mother; Heart attack in his father; Heart disease in his father and mother; Hyperlipidemia in his father and mother; Hypertension in his father and mother.He reports that he quit smoking about 11 years ago. His smoking use included cigarettes. He has never used smokeless tobacco. He reports current alcohol use of about 12.0 standard drinks of alcohol per week. He reports previous drug use.  Current Outpatient Medications on File Prior to Visit  Medication Sig Dispense Refill  . allopurinol (ZYLOPRIM) 100 MG tablet TAKE 1 TABLET 2 TO 3 TIMES A DAY FOR GOUT 90 tablet 3  . atorvastatin (LIPITOR) 80 MG tablet Take 1 tablet (80 mg total) by mouth daily. 90 tablet 1  . carvedilol (COREG) 12.5 MG tablet Take 12.5 mg by mouth 2 (two) times daily with a meal.    . clopidogrel (PLAVIX) 75 MG tablet Take 1 tablet (75 mg total) by mouth daily. 90 tablet 1  . ELIQUIS 5 MG TABS tablet TAKE 1 TABLET 2 TIMES A DAY 60 tablet 0  . furosemide (LASIX) 20 MG tablet Take 1 tablet (20 mg total) by mouth daily. (Patient taking differently: Take 10 mg by mouth daily.) 90 tablet 1  . glimepiride (AMARYL) 2 MG tablet Take 1 tablet (2 mg total) by mouth every morning. 30 tablet 11  . isosorbide mononitrate (IMDUR) 30 MG 24 hr tablet Take 1 tablet (30 mg total) by mouth daily. 90 tablet 2  . lisinopril (ZESTRIL) 10 MG tablet Take 10 mg by mouth daily.    . polyethylene glycol (MIRALAX / GLYCOLAX) packet Take 17 g by mouth  daily. (Patient taking differently: Take 8.5 g by mouth daily as needed.) 30 packet 1   No current facility-administered medications on file prior to visit.    ROS Review of Systems  Constitutional: Negative for fever.  Respiratory: Negative for shortness of breath.   Cardiovascular: Negative for chest pain.  Musculoskeletal:  Negative for arthralgias.  Skin: Negative for rash.       Itching all over     Objective:  BP (!) 192/80   Pulse 70   Temp 97.7 F (36.5 C)   Ht 5\' 8"  (1.727 m)   Wt 208 lb 3.2 oz (94.4 kg)   SpO2 97%   BMI 31.66 kg/m   BP Readings from Last 3 Encounters:  11/28/20 (!) 192/80  11/16/20 (!) 212/80  08/30/20 (!) 200/91    Wt Readings from Last 3 Encounters:  11/28/20 208 lb 3.2 oz (94.4 kg)  11/16/20 209 lb 9.6 oz (95.1 kg)  08/30/20 207 lb (93.9 kg)     Physical Exam Vitals reviewed.  Constitutional:      Appearance: He is well-developed and well-nourished.  HENT:     Head: Normocephalic and atraumatic.     Right Ear: Tympanic membrane and external ear normal. No decreased hearing noted.     Left Ear: Tympanic membrane and external ear normal. No decreased hearing noted.     Mouth/Throat:     Pharynx: No oropharyngeal exudate or posterior oropharyngeal erythema.  Eyes:     Pupils: Pupils are equal, round, and reactive to light.  Cardiovascular:     Rate and Rhythm: Normal rate and regular rhythm.     Heart sounds: No murmur heard.   Pulmonary:     Effort: No respiratory distress.     Breath sounds: Normal breath sounds.  Abdominal:     General: Bowel sounds are normal.     Palpations: Abdomen is soft. There is no mass.     Tenderness: There is no abdominal tenderness.  Musculoskeletal:     Cervical back: Normal range of motion and neck supple.    Diabetic Foot Exam - Simple   Simple Foot Form Diabetic Foot exam was performed with the following findings: Yes 11/28/2020  1:55 PM  Visual Inspection No deformities, no ulcerations, no other skin breakdown bilaterally: Yes Sensation Testing Intact to touch and monofilament testing bilaterally: Yes Pulse Check Posterior Tibialis and Dorsalis pulse intact bilaterally: Yes Comments       Assessment & Plan:   Connor Bryant was seen today for medical management of chronic issues.  Diagnoses and all orders for  this visit:  Type 2 diabetes mellitus with chronic kidney disease, without long-term current use of insulin, unspecified CKD stage (HCC) -     Bayer DCA Hb A1c Waived -     Microalbumin / creatinine urine ratio -     CBC with Differential/Platelet -     CMP14+EGFR -     Lipid panel -     Thyroid Panel With TSH  Essential hypertension -     CBC with Differential/Platelet -     CMP14+EGFR -     Lipid panel -     Thyroid Panel With TSH -     hydrALAZINE (APRESOLINE) 100 MG tablet; Take 1 tablet (100 mg total) by mouth 3 (three) times daily.  Pure hypercholesterolemia -     CBC with Differential/Platelet -     CMP14+EGFR -     Lipid panel -     Thyroid Panel  With TSH  Need for hepatitis C screening test -     CBC with Differential/Platelet -     Hepatitis C antibody  Atherosclerosis of aorta (HCC)  PAF (paroxysmal atrial fibrillation) (HCC)  Other orders -     levocetirizine (XYZAL) 5 MG tablet; Take 1 tablet (5 mg total) by mouth every evening.   Allergies as of 11/28/2020   No Known Allergies     Medication List       Accurate as of November 28, 2020  2:57 PM. If you have any questions, ask your nurse or doctor.        STOP taking these medications   loratadine 10 MG tablet Commonly known as: CLARITIN Stopped by: Claretta Fraise, MD     TAKE these medications   allopurinol 100 MG tablet Commonly known as: ZYLOPRIM TAKE 1 TABLET 2 TO 3 TIMES A DAY FOR GOUT   atorvastatin 80 MG tablet Commonly known as: LIPITOR Take 1 tablet (80 mg total) by mouth daily.   carvedilol 12.5 MG tablet Commonly known as: COREG Take 12.5 mg by mouth 2 (two) times daily with a meal.   clopidogrel 75 MG tablet Commonly known as: PLAVIX Take 1 tablet (75 mg total) by mouth daily.   Eliquis 5 MG Tabs tablet Generic drug: apixaban TAKE 1 TABLET 2 TIMES A DAY   furosemide 20 MG tablet Commonly known as: LASIX Take 1 tablet (20 mg total) by mouth daily. What changed: how much to  take   glimepiride 2 MG tablet Commonly known as: Amaryl Take 1 tablet (2 mg total) by mouth every morning.   hydrALAZINE 100 MG tablet Commonly known as: APRESOLINE Take 1 tablet (100 mg total) by mouth 3 (three) times daily. What changed:   medication strength  how much to take Changed by: Claretta Fraise, MD   isosorbide mononitrate 30 MG 24 hr tablet Commonly known as: IMDUR Take 1 tablet (30 mg total) by mouth daily.   levocetirizine 5 MG tablet Commonly known as: XYZAL Take 1 tablet (5 mg total) by mouth every evening. Started by: Claretta Fraise, MD   lisinopril 10 MG tablet Commonly known as: ZESTRIL Take 10 mg by mouth daily.   polyethylene glycol 17 g packet Commonly known as: MIRALAX / GLYCOLAX Take 17 g by mouth daily. What changed:   how much to take  when to take this  reasons to take this       Meds ordered this encounter  Medications  . levocetirizine (XYZAL) 5 MG tablet    Sig: Take 1 tablet (5 mg total) by mouth every evening.    Dispense:  90 tablet    Refill:  3  . hydrALAZINE (APRESOLINE) 100 MG tablet    Sig: Take 1 tablet (100 mg total) by mouth 3 (three) times daily.    Dispense:  90 tablet    Refill:  1      Follow-up: Return in about 3 months (around 02/28/2021).  Claretta Fraise, M.D.

## 2020-11-29 LAB — CBC WITH DIFFERENTIAL/PLATELET
Basophils Absolute: 0 10*3/uL (ref 0.0–0.2)
Basos: 1 %
EOS (ABSOLUTE): 0.5 10*3/uL — ABNORMAL HIGH (ref 0.0–0.4)
Eos: 7 %
Hematocrit: 37.9 % (ref 37.5–51.0)
Hemoglobin: 12.5 g/dL — ABNORMAL LOW (ref 13.0–17.7)
Immature Grans (Abs): 0 10*3/uL (ref 0.0–0.1)
Immature Granulocytes: 0 %
Lymphocytes Absolute: 1.5 10*3/uL (ref 0.7–3.1)
Lymphs: 24 %
MCH: 29.4 pg (ref 26.6–33.0)
MCHC: 33 g/dL (ref 31.5–35.7)
MCV: 89 fL (ref 79–97)
Monocytes Absolute: 0.6 10*3/uL (ref 0.1–0.9)
Monocytes: 10 %
Neutrophils Absolute: 3.8 10*3/uL (ref 1.4–7.0)
Neutrophils: 58 %
Platelets: 194 10*3/uL (ref 150–450)
RBC: 4.25 x10E6/uL (ref 4.14–5.80)
RDW: 14.7 % (ref 11.6–15.4)
WBC: 6.5 10*3/uL (ref 3.4–10.8)

## 2020-11-29 LAB — THYROID PANEL WITH TSH
Free Thyroxine Index: 1.8 (ref 1.2–4.9)
T3 Uptake Ratio: 28 % (ref 24–39)
T4, Total: 6.3 ug/dL (ref 4.5–12.0)
TSH: 3.56 u[IU]/mL (ref 0.450–4.500)

## 2020-11-29 LAB — LIPID PANEL
Chol/HDL Ratio: 2.9 ratio (ref 0.0–5.0)
Cholesterol, Total: 156 mg/dL (ref 100–199)
HDL: 53 mg/dL (ref >39)
LDL Chol Calc (NIH): 78 mg/dL (ref 0–99)
Triglycerides: 142 mg/dL (ref 0–149)
VLDL Cholesterol Cal: 25 mg/dL (ref 5–40)

## 2020-11-29 LAB — CMP14+EGFR
ALT: 17 IU/L (ref 0–44)
AST: 20 IU/L (ref 0–40)
Albumin/Globulin Ratio: 1.4 (ref 1.2–2.2)
Albumin: 4.2 g/dL (ref 3.7–4.7)
Alkaline Phosphatase: 110 IU/L (ref 44–121)
BUN/Creatinine Ratio: 23 (ref 10–24)
BUN: 35 mg/dL — ABNORMAL HIGH (ref 8–27)
Bilirubin Total: 0.4 mg/dL (ref 0.0–1.2)
CO2: 19 mmol/L — ABNORMAL LOW (ref 20–29)
Calcium: 9.6 mg/dL (ref 8.6–10.2)
Chloride: 106 mmol/L (ref 96–106)
Creatinine, Ser: 1.52 mg/dL — ABNORMAL HIGH (ref 0.76–1.27)
Globulin, Total: 2.9 g/dL (ref 1.5–4.5)
Glucose: 93 mg/dL (ref 65–99)
Potassium: 4.5 mmol/L (ref 3.5–5.2)
Sodium: 141 mmol/L (ref 134–144)
Total Protein: 7.1 g/dL (ref 6.0–8.5)
eGFR: 47 mL/min/{1.73_m2} — ABNORMAL LOW (ref >59)

## 2020-11-29 LAB — HEPATITIS C ANTIBODY: Hep C Virus Ab: <0.1 s/co ratio (ref 0.0–0.9)

## 2020-11-29 NOTE — Progress Notes (Signed)
Hello Clancy,  Your lab result is normal and/or stable.Some minor variations that are not significant are commonly marked abnormal, but do not represent any medical problem for you.  Best regards, Warren Stacks, M.D.

## 2020-12-08 ENCOUNTER — Other Ambulatory Visit: Payer: Self-pay | Admitting: Family Medicine

## 2020-12-20 ENCOUNTER — Other Ambulatory Visit: Payer: Self-pay

## 2020-12-20 ENCOUNTER — Other Ambulatory Visit: Payer: Medicare Other

## 2020-12-20 DIAGNOSIS — N183 Chronic kidney disease, stage 3 unspecified: Secondary | ICD-10-CM | POA: Diagnosis not present

## 2020-12-27 DIAGNOSIS — I5020 Unspecified systolic (congestive) heart failure: Secondary | ICD-10-CM | POA: Diagnosis not present

## 2020-12-27 DIAGNOSIS — I2510 Atherosclerotic heart disease of native coronary artery without angina pectoris: Secondary | ICD-10-CM | POA: Diagnosis not present

## 2020-12-27 DIAGNOSIS — N183 Chronic kidney disease, stage 3 unspecified: Secondary | ICD-10-CM | POA: Diagnosis not present

## 2020-12-27 DIAGNOSIS — I129 Hypertensive chronic kidney disease with stage 1 through stage 4 chronic kidney disease, or unspecified chronic kidney disease: Secondary | ICD-10-CM | POA: Diagnosis not present

## 2020-12-27 DIAGNOSIS — I639 Cerebral infarction, unspecified: Secondary | ICD-10-CM | POA: Diagnosis not present

## 2020-12-27 DIAGNOSIS — I739 Peripheral vascular disease, unspecified: Secondary | ICD-10-CM | POA: Diagnosis not present

## 2021-02-22 ENCOUNTER — Other Ambulatory Visit: Payer: Self-pay | Admitting: Family Medicine

## 2021-02-22 DIAGNOSIS — I10 Essential (primary) hypertension: Secondary | ICD-10-CM

## 2021-03-02 ENCOUNTER — Other Ambulatory Visit: Payer: Self-pay

## 2021-03-02 ENCOUNTER — Encounter: Payer: Self-pay | Admitting: Family Medicine

## 2021-03-02 ENCOUNTER — Ambulatory Visit (INDEPENDENT_AMBULATORY_CARE_PROVIDER_SITE_OTHER): Payer: Medicare Other | Admitting: Family Medicine

## 2021-03-02 VITALS — BP 173/64 | HR 67 | Temp 97.7°F | Ht 68.0 in | Wt 214.2 lb

## 2021-03-02 DIAGNOSIS — E1122 Type 2 diabetes mellitus with diabetic chronic kidney disease: Secondary | ICD-10-CM

## 2021-03-02 DIAGNOSIS — E7800 Pure hypercholesterolemia, unspecified: Secondary | ICD-10-CM | POA: Diagnosis not present

## 2021-03-02 DIAGNOSIS — R7989 Other specified abnormal findings of blood chemistry: Secondary | ICD-10-CM | POA: Diagnosis not present

## 2021-03-02 LAB — BAYER DCA HB A1C WAIVED: HB A1C (BAYER DCA - WAIVED): 6 % (ref <7.0)

## 2021-03-02 MED ORDER — LISINOPRIL 20 MG PO TABS: 20.0000 mg | ORAL_TABLET | Freq: Every day | ORAL | 3 refills | Status: DC

## 2021-03-02 NOTE — Progress Notes (Signed)
Subjective:  Patient ID: Connor Bryant,  male    DOB: 1946-03-29  Age: 75 y.o.    CC: Medical Management of Chronic Issues   HPI RISHAWN WALCK presents for  follow-up of hypertension. Patient has no history of headache chest pain or shortness of breath or recent cough. Patient also denies symptoms of TIA such as numbness weakness lateralizing. Patient denies side effects from medication. States taking it regularly.  Patient also  in for follow-up of elevated cholesterol. Doing well without complaints on current medication. Denies side effects  including myalgia and arthralgia and nausea. Also in today for liver function testing. Currently no chest pain, shortness of breath or other cardiovascular related symptoms noted.  Follow-up of diabetes. Patient does not check blood sugar at home. Patient denies symptoms such as excessive hunger or urinary frequency, excessive hunger, nausea No significant hypoglycemic spells noted. Medications reviewed. Pt reports taking them regularly. Pt. denies complication/adverse reaction today.   Exam revealed peripherla vision problem. Going to specialist to consider prism lensHad bleeding episode so he quit taking Plavix.     History Arsh has a past medical history of Acute CVA (cerebrovascular accident) ([place]) (05/01/2020), Acute respiratory failure with hypoxia ([place]) (05/04/2020), Arthritis, DM type 2 (diabetes mellitus, type 2) ([place]), Endotracheally intubated, Gallstone, Heart murmur, HTN (hypertension), Hyperlipidemia, Personal history of colonic polyp-adenoma (11/24/2013), Renal insufficiency, and Thoracic spine fracture ([place]).   He has a past surgical history that includes Debridement tennis elbow (Right); Tonsillectomy and adenoidectomy; Colonoscopy; Esophagogastroduodenoscopy; Mesenteric artery bypass (N/A, 03/10/2014); abdominal aortagram (N/A, 02/23/2014); and vascualr surgery.   His family history includes AAA (abdominal aortic aneurysm) in his  mother; Bladder Cancer in his father; Cancer in his father; Coronary artery disease in his father; Diabetes in his father and mother; Heart attack in his father; Heart disease in his father and mother; Hyperlipidemia in his father and mother; Hypertension in his father and mother.He reports that he quit smoking about 11 years ago. His smoking use included cigarettes. He has never used smokeless tobacco. He reports current alcohol use of about 12.0 standard drinks of alcohol per week. He reports previous drug use.  Current Outpatient Medications on File Prior to Visit  Medication Sig Dispense Refill   allopurinol (ZYLOPRIM) 100 MG tablet TAKE 1 TABLET 2 TO 3 TIMES A DAY FOR GOUT 90 tablet 3   atorvastatin (LIPITOR) 80 MG tablet Take 1 tablet (80 mg total) by mouth daily. 90 tablet 1   carvedilol (COREG) 12.5 MG tablet Take 12.5 mg by mouth 2 (two) times daily with a meal.     chlorthalidone (HYGROTON) 25 MG tablet Take 12.5 mg by mouth daily.     Coenzyme Q10 (COQ10) 200 MG CAPS Take by mouth daily.     ELIQUIS 5 MG TABS tablet TAKE 1 TABLET 2 TIMES A DAY 60 tablet 2   glimepiride (AMARYL) 2 MG tablet Take 1 tablet (2 mg total) by mouth every morning. 30 tablet 11   hydrALAZINE (APRESOLINE) 100 MG tablet Take 1 tablet 3 times daily. 90 tablet 0   isosorbide mononitrate (IMDUR) 30 MG 24 hr tablet Take 1 tablet (30 mg total) by mouth daily. 90 tablet 2   levocetirizine (XYZAL) 5 MG tablet Take 1 tablet (5 mg total) by mouth every evening. 90 tablet 3   polyethylene glycol (MIRALAX / GLYCOLAX) packet Take 17 g by mouth daily. (Patient taking differently: Take 8.5 g by mouth daily as needed.) 30 packet 1   No  current facility-administered medications on file prior to visit.    ROS Review of Systems  Constitutional:  Negative for fever.  Respiratory:  Negative for shortness of breath.   Cardiovascular:  Negative for chest pain.  Musculoskeletal:  Negative for arthralgias.  Skin:  Negative for  rash.   Objective:  BP (!) 173/64   Pulse 67   Temp 97.7 F (36.5 C)   Ht 5' 8"  (1.727 m)   Wt 214 lb 3.2 oz (97.2 kg)   SpO2 97%   BMI 32.57 kg/m   BP Readings from Last 3 Encounters:  03/02/21 (!) 173/64  11/28/20 (!) 192/80  11/16/20 (!) 212/80    Wt Readings from Last 3 Encounters:  03/02/21 214 lb 3.2 oz (97.2 kg)  11/28/20 208 lb 3.2 oz (94.4 kg)  11/16/20 209 lb 9.6 oz (95.1 kg)     Physical Exam Constitutional:      General: He is not in acute distress.    Appearance: He is well-developed.  HENT:     Head: Normocephalic and atraumatic.     Right Ear: External ear normal.     Left Ear: External ear normal.     Nose: Nose normal.  Eyes:     Conjunctiva/sclera: Conjunctivae normal.     Pupils: Pupils are equal, round, and reactive to light.  Cardiovascular:     Rate and Rhythm: Normal rate and regular rhythm.     Heart sounds: Normal heart sounds. No murmur heard. Pulmonary:     Effort: Pulmonary effort is normal. No respiratory distress.     Breath sounds: Normal breath sounds. No wheezing or rales.  Abdominal:     Palpations: Abdomen is soft.     Tenderness: There is no abdominal tenderness.  Musculoskeletal:        General: Normal range of motion.     Cervical back: Normal range of motion and neck supple.  Skin:    General: Skin is warm and dry.  Neurological:     Mental Status: He is alert and oriented to person, place, and time.     Deep Tendon Reflexes: Reflexes are normal and symmetric.  Psychiatric:        Behavior: Behavior normal.        Thought Content: Thought content normal.        Judgment: Judgment normal.    Diabetic Foot Exam - Simple   No data filed       Assessment & Plan:   Jesiah was seen today for medical management of chronic issues.  Diagnoses and all orders for this visit:  Type 2 diabetes mellitus with chronic kidney disease, without long-term current use of insulin, unspecified CKD stage (HCC) -     Bayer DCA  Hb A1c Waived -     CBC with Differential/Platelet -     CMP14+EGFR  Pure hypercholesterolemia -     Lipid panel  Elevated TSH -     Thyroid Panel With TSH  Other orders -     lisinopril (ZESTRIL) 20 MG tablet; Take 1 tablet (20 mg total) by mouth daily.  I have discontinued Arlana Pouch "[name]"'s furosemide, lisinopril, and clopidogrel. I am also having him start on lisinopril. Additionally, I am having him maintain his polyethylene glycol, allopurinol, glimepiride, isosorbide mononitrate, atorvastatin, carvedilol, levocetirizine, Eliquis, hydrALAZINE, chlorthalidone, and CoQ10.  Meds ordered this encounter  Medications   lisinopril (ZESTRIL) 20 MG tablet    Sig: Take 1 tablet (20 mg total) by mouth  daily.    Dispense:  90 tablet    Refill:  3     Follow-up: No follow-ups on file.  Claretta Fraise, M.D.

## 2021-03-03 LAB — CBC WITH DIFFERENTIAL/PLATELET
Basophils Absolute: 0 10*3/uL (ref 0.0–0.2)
Basos: 0 %
EOS (ABSOLUTE): 0.4 10*3/uL (ref 0.0–0.4)
Eos: 5 %
Hematocrit: 37.2 % — ABNORMAL LOW (ref 37.5–51.0)
Hemoglobin: 12.3 g/dL — ABNORMAL LOW (ref 13.0–17.7)
Immature Grans (Abs): 0 10*3/uL (ref 0.0–0.1)
Immature Granulocytes: 0 %
Lymphocytes Absolute: 1.6 10*3/uL (ref 0.7–3.1)
Lymphs: 23 %
MCH: 30.2 pg (ref 26.6–33.0)
MCHC: 33.1 g/dL (ref 31.5–35.7)
MCV: 91 fL (ref 79–97)
Monocytes Absolute: 0.8 10*3/uL (ref 0.1–0.9)
Monocytes: 11 %
Neutrophils Absolute: 4.1 10*3/uL (ref 1.4–7.0)
Neutrophils: 61 %
Platelets: 200 10*3/uL (ref 150–450)
RBC: 4.07 x10E6/uL — ABNORMAL LOW (ref 4.14–5.80)
RDW: 14 % (ref 11.6–15.4)
WBC: 6.9 10*3/uL (ref 3.4–10.8)

## 2021-03-03 LAB — CMP14+EGFR
ALT: 20 IU/L (ref 0–44)
AST: 16 IU/L (ref 0–40)
Albumin/Globulin Ratio: 1.6 (ref 1.2–2.2)
Albumin: 4.4 g/dL (ref 3.7–4.7)
Alkaline Phosphatase: 103 IU/L (ref 44–121)
BUN/Creatinine Ratio: 26 — ABNORMAL HIGH (ref 10–24)
BUN: 37 mg/dL — ABNORMAL HIGH (ref 8–27)
Bilirubin Total: 0.4 mg/dL (ref 0.0–1.2)
CO2: 20 mmol/L (ref 20–29)
Calcium: 9.3 mg/dL (ref 8.6–10.2)
Chloride: 108 mmol/L — ABNORMAL HIGH (ref 96–106)
Creatinine, Ser: 1.45 mg/dL — ABNORMAL HIGH (ref 0.76–1.27)
Globulin, Total: 2.7 g/dL (ref 1.5–4.5)
Glucose: 86 mg/dL (ref 65–99)
Potassium: 4.6 mmol/L (ref 3.5–5.2)
Sodium: 145 mmol/L — ABNORMAL HIGH (ref 134–144)
Total Protein: 7.1 g/dL (ref 6.0–8.5)
eGFR: 50 mL/min/{1.73_m2} — ABNORMAL LOW (ref >59)

## 2021-03-03 LAB — LIPID PANEL
Chol/HDL Ratio: 2.5 ratio (ref 0.0–5.0)
Cholesterol, Total: 147 mg/dL (ref 100–199)
HDL: 59 mg/dL (ref >39)
LDL Chol Calc (NIH): 67 mg/dL (ref 0–99)
Triglycerides: 121 mg/dL (ref 0–149)
VLDL Cholesterol Cal: 21 mg/dL (ref 5–40)

## 2021-03-03 LAB — THYROID PANEL WITH TSH
Free Thyroxine Index: 2 (ref 1.2–4.9)
T3 Uptake Ratio: 32 % (ref 24–39)
T4, Total: 6.4 ug/dL (ref 4.5–12.0)
TSH: 2.94 u[IU]/mL (ref 0.450–4.500)

## 2021-03-05 ENCOUNTER — Encounter: Payer: Self-pay | Admitting: Family Medicine

## 2021-03-05 NOTE — Progress Notes (Signed)
Hello [place],  Your lab result is normal and/or stable.Some minor variations that are not significant are commonly marked abnormal, but do not represent any medical problem for you.  Best regards, Jaylei Fuerte, M.D.

## 2021-03-09 ENCOUNTER — Other Ambulatory Visit: Payer: Self-pay | Admitting: Family Medicine

## 2021-03-09 DIAGNOSIS — E756 Lipid storage disorder, unspecified: Secondary | ICD-10-CM

## 2021-03-09 DIAGNOSIS — E1122 Type 2 diabetes mellitus with diabetic chronic kidney disease: Secondary | ICD-10-CM

## 2021-03-20 ENCOUNTER — Ambulatory Visit: Payer: Medicare Other | Admitting: Cardiology

## 2021-03-20 ENCOUNTER — Other Ambulatory Visit: Payer: Self-pay

## 2021-03-20 VITALS — BP 164/70 | HR 74 | Ht 68.0 in | Wt 218.0 lb

## 2021-03-20 DIAGNOSIS — I350 Nonrheumatic aortic (valve) stenosis: Secondary | ICD-10-CM | POA: Diagnosis not present

## 2021-03-20 DIAGNOSIS — I10 Essential (primary) hypertension: Secondary | ICD-10-CM

## 2021-03-20 DIAGNOSIS — Z8679 Personal history of other diseases of the circulatory system: Secondary | ICD-10-CM

## 2021-03-20 DIAGNOSIS — I6523 Occlusion and stenosis of bilateral carotid arteries: Secondary | ICD-10-CM

## 2021-03-20 DIAGNOSIS — I739 Peripheral vascular disease, unspecified: Secondary | ICD-10-CM

## 2021-03-20 DIAGNOSIS — I4891 Unspecified atrial fibrillation: Secondary | ICD-10-CM | POA: Diagnosis not present

## 2021-03-20 NOTE — Patient Instructions (Signed)
Medication Instructions:  Continue all current medications.   Labwork: none  Testing/Procedures: none  Follow-Up: 6 months   Any Other Special Instructions Will Be Listed Below (If Applicable).   If you need a refill on your cardiac medications before your next appointment, please call your pharmacy.  

## 2021-03-20 NOTE — Progress Notes (Signed)
Clinical Summary Mr. Voris is a 75 y.o.maleseen today for follow up of the following medical problems.        1. NSTEMI - NSTEMI druing admission where he presented with CVA initially - peak trop 2500  - 04/2020 echo LVEF 35% (acute change from 05/02/20 echo) with apical akinesis. - possible acute plaque rupture vs coronary embolic event given presented with new onset afib and stroke vs. Stress induced CM/Takotsubo CM given echo findings - admission complicated by PE, hematuria, anemia, AKI given recent stroke and respiratory failure on ventilator did not pursue emergent cath   06/2020 echo LVEF 60-65%, no WMAs  - no recent chest pains. No SOB/DOE    2. Chronic sysotlic HF - new diagnosis during admission with CVA - - 04/2020 echo LVEF 35% (acute change from 05/02/20 echo) with apical akinesis.  - cath not pursued at that time due to CVA. Admission also complicated with PE, hematuria, anemia, AKI     06/2020 echo LVEF 60-65%, no WMAs - clincal scenario would suggest this may have been a stress induced CM in setting of his CVA   - no recent SOb/DOE - compliant with meds       2. PAF - new diagnosis during admission with CVA - 07/2020 monitor without recurrent afib  - no recent palpitations. Compliant with eliquis     3. CVA - per pcp     4. PE - has been on eliquis   5. Anemia     6. PAD  s/p prior mesenteric artery bypass and recent bilateral iliac stenting at [facility] on 04/27/2020 - now off plavix, had noted some excessive gum bleeding. Just on eliquis   7. Aortic stenosis - mild to moderate by 06/2020 echo, AVA VTI 1.18 and mean grad 16.    8. HTN - his home bp's 110-150.  - he reports significant white coat HTN. - changes by pcp and nephrology recently.    9. Carotid stenosis - noted on MRA - on medical therapy.      Past Medical History:  Diagnosis Date   Acute CVA (cerebrovascular accident) ([place]) 05/01/2020   Acute respiratory failure  with hypoxia ([place]) 05/04/2020   Arthritis    DM type 2 (diabetes mellitus, type 2) ([place])    no meds   Endotracheally intubated    Gallstone    Heart murmur    was told one time he had a murmur, not since 30 years ago   HTN (hypertension)    Hyperlipidemia    Personal history of colonic polyp-adenoma 11/24/2013   Renal insufficiency    mild   Thoracic spine fracture ([facility])      No Known Allergies   Current Outpatient Medications  Medication Sig Dispense Refill   allopurinol (ZYLOPRIM) 100 MG tablet TAKE 1 TABLET 2 TO 3 TIMES A DAY FOR GOUT 90 tablet 3   atorvastatin (LIPITOR) 80 MG tablet Take 1 tablet (80 mg total) by mouth daily. 90 tablet 1   carvedilol (COREG) 12.5 MG tablet Take 12.5 mg by mouth 2 (two) times daily with a meal.     chlorthalidone (HYGROTON) 25 MG tablet Take 12.5 mg by mouth daily.     Coenzyme Q10 (COQ10) 200 MG CAPS Take by mouth daily.     ELIQUIS 5 MG TABS tablet TAKE ONE TABLET BY MOUTH TWICE DAILY 60 tablet 1   glimepiride (AMARYL) 2 MG tablet Take 1 tablet (2 mg total) by mouth every morning.  30 tablet 11   hydrALAZINE (APRESOLINE) 100 MG tablet Take 1 tablet 3 times daily. 90 tablet 0   isosorbide mononitrate (IMDUR) 30 MG 24 hr tablet TAKE 1 TABLET DAILY 90 tablet 2   levocetirizine (XYZAL) 5 MG tablet Take 1 tablet (5 mg total) by mouth every evening. 90 tablet 3   lisinopril (ZESTRIL) 20 MG tablet Take 1 tablet (20 mg total) by mouth daily. 90 tablet 3   polyethylene glycol (MIRALAX / GLYCOLAX) packet Take 17 g by mouth daily. (Patient taking differently: Take 8.5 g by mouth daily as needed.) 30 packet 1   No current facility-administered medications for this visit.     Past Surgical History:  Procedure Laterality Date   ABDOMINAL AORTAGRAM N/A 02/23/2014   Procedure: ABDOMINAL AORTAGRAM;  Surgeon: Conrad Mattawana, MD;  Location: [facility] CATH LAB;  Service: Cardiovascular;  Laterality: N/A;   COLONOSCOPY     DEBRIDEMENT TENNIS ELBOW Right     ESOPHAGOGASTRODUODENOSCOPY     MESENTERIC ARTERY BYPASS N/A 03/10/2014   Procedure: AORTO TO SUPERIOR MESENTERIC ARTERY BYPASS;  Surgeon: [name], MD;  Location: [facility] OR;  Service: Vascular;  Laterality: N/A;   TONSILLECTOMY AND ADENOIDECTOMY     vascualr surgery     illiac stents     No Known Allergies    Family History  Problem Relation Age of Onset   Hypertension Father    Coronary artery disease Father    Hyperlipidemia Father    Diabetes Father    Bladder Cancer Father    Cancer Father    Heart disease Father    Heart attack Father    Diabetes Mother    Heart disease Mother        before age 42   Hyperlipidemia Mother    Hypertension Mother    AAA (abdominal aortic aneurysm) Mother    Colon cancer Neg Hx    Throat cancer Neg Hx    Kidney disease Neg Hx    Liver disease Neg Hx      Social History Mr. Loera reports that he quit smoking about 11 years ago. His smoking use included cigarettes. He has never used smokeless tobacco. Mr. Coutant reports current alcohol use of about 12.0 standard drinks of alcohol per week.   Review of Systems CONSTITUTIONAL: No weight loss, fever, chills, weakness or fatigue.  HEENT: Eyes: No visual loss, blurred vision, double vision or yellow sclerae.No hearing loss, sneezing, congestion, runny nose or sore throat.  SKIN: No rash or itching.  CARDIOVASCULAR: per hpi RESPIRATORY: No shortness of breath, cough or sputum.  GASTROINTESTINAL: No anorexia, nausea, vomiting or diarrhea. No abdominal pain or blood.  GENITOURINARY: No burning on urination, no polyuria NEUROLOGICAL: No headache, dizziness, syncope, paralysis, ataxia, numbness or tingling in the extremities. No change in bowel or bladder control.  MUSCULOSKELETAL: No muscle, back pain, joint pain or stiffness.  LYMPHATICS: No enlarged nodes. No history of splenectomy.  PSYCHIATRIC: No history of depression or anxiety.  ENDOCRINOLOGIC: No reports of sweating, cold or heat  intolerance. No polyuria or polydipsia.  Marland Kitchen   Physical Examination Today's Vitals   03/20/21 1350  BP: (!) 164/70  Pulse: 74  SpO2: 99%  Weight: 218 lb (98.9 kg)  Height: 5\' 8"  (1.727 m)   Body mass index is 33.15 kg/m.  Gen: resting comfortably, no acute distress HEENT: no scleral icterus, pupils equal round and reactive, no palptable cervical adenopathy,  CV: RRR, no mr/g, no jvd Resp: Clear to auscultation  bilaterally GI: abdomen is soft, non-tender, non-distended, normal bowel sounds, no hepatosplenomegaly MSK: extremities are warm, no edema.  Skin: warm, no rash Neuro:  no focal deficits Psych: appropriate affect   Diagnostic Studies     Assessment and Plan     1. History of cardiomyopathy - LVEF has normalize, likely stress induced CM - continue to monitor   2.  HTN - has had some component of white coat HTN - recent changes by pcp, his neprhologist has also made changes. To avoid creating confusion will defer further adjustments to pcp and neprhology   3 Afib - no recent symptoms, continue current meds including anticoag.    4. PAD - continue to f/u with [facility], I encouraged him to call to make sure he is update to day with follow ups   5. Aortic stenosis - mild to moderate by last echo, needs repeat at next appt later this year   6. Carotid stenosis - repeat carotid US later this year after next f/u     F/u 6 months   Arnoldo Lenis, M.D.

## 2021-03-28 ENCOUNTER — Other Ambulatory Visit: Payer: Self-pay | Admitting: Family Medicine

## 2021-03-28 DIAGNOSIS — I10 Essential (primary) hypertension: Secondary | ICD-10-CM

## 2021-04-05 ENCOUNTER — Other Ambulatory Visit: Payer: Self-pay | Admitting: Family Medicine

## 2021-04-05 DIAGNOSIS — M1A9XX0 Chronic gout, unspecified, without tophus (tophi): Secondary | ICD-10-CM

## 2021-05-09 ENCOUNTER — Other Ambulatory Visit: Payer: Self-pay | Admitting: Family Medicine

## 2021-05-10 ENCOUNTER — Other Ambulatory Visit: Payer: Self-pay | Admitting: Family Medicine

## 2021-06-01 ENCOUNTER — Other Ambulatory Visit: Payer: Self-pay | Admitting: Family Medicine

## 2021-06-02 IMAGING — CT CT ANGIO CHEST
2 of 6 series · 17 of 46 positions shown · IV contrast (omnipaque)
Comparison: Chest radiograph dated 05/05/2020.
COMPARISON: Chest radiograph dated 05/05/2020.

Addendum:
CLINICAL DATA: Concern for pulmonary embolism.

EXAM:
CT ANGIOGRAPHY CHEST WITH CONTRAST
TECHNIQUE: Multidetector CT imaging of the chest was performed using the
standard protocol during bolus administration of intravenous
contrast. Multiplanar CT image reconstructions and MIPs were
obtained to evaluate the vascular anatomy.
CONTRAST:  100mL OMNIPAQUE IOHEXOL 350 MG/ML SOLN

[Series 5: pe axial thins · axial · 0.74mm/px · z∈[+880,+1163]mm · 14 of 311 slices shown]
[im 14/311  lung]
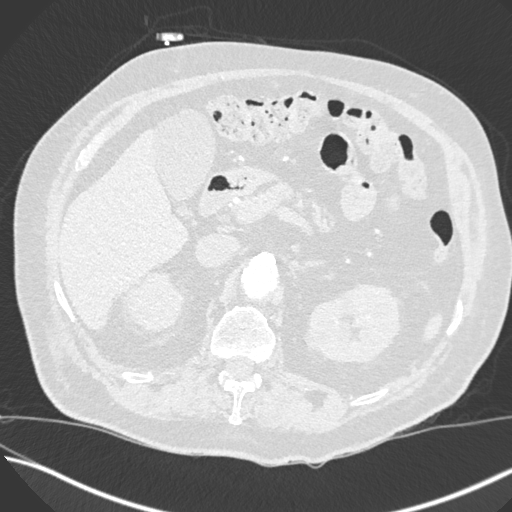
[im 41/311  soft-tissue]
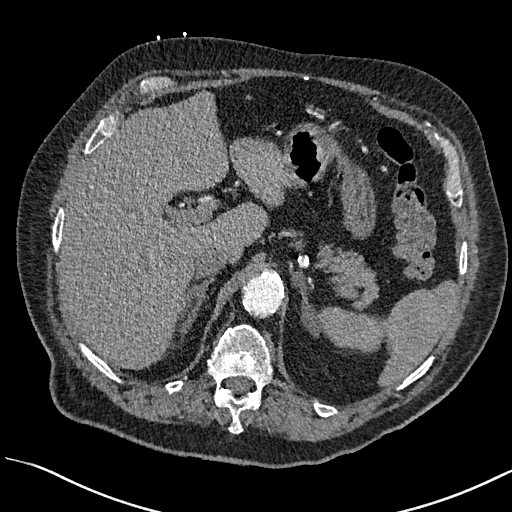
[im 54/311  lung]
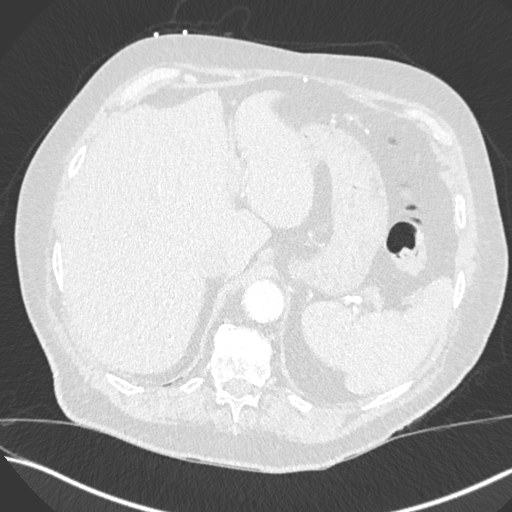
[im 81/311  soft-tissue]
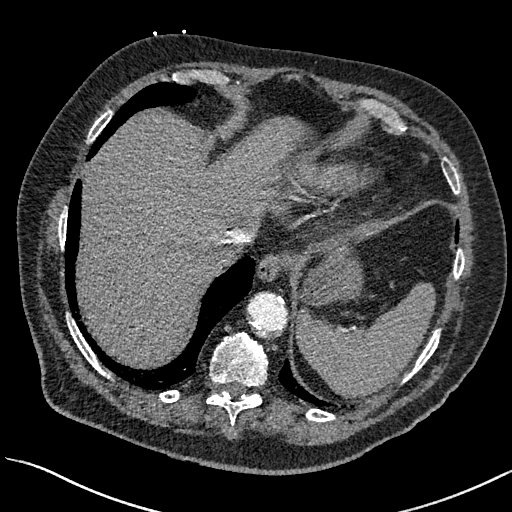
[im 108/311  lung]
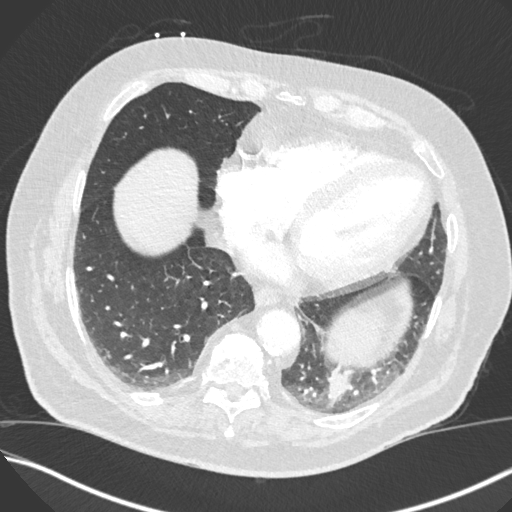
[im 122/311  soft-tissue]
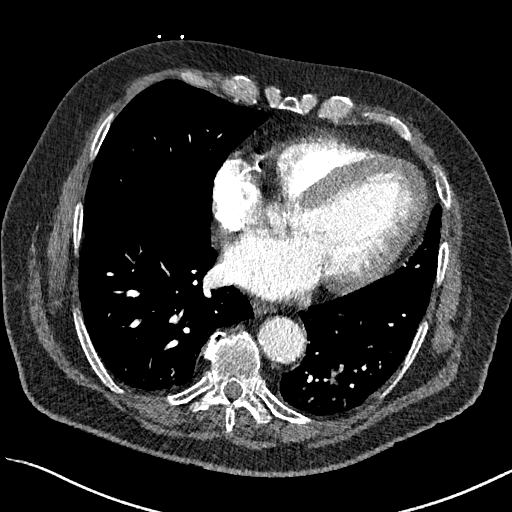
[im 149/311  lung]
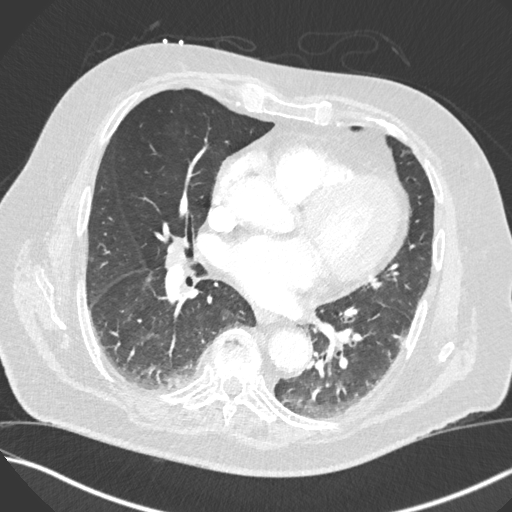
[im 162/311  soft-tissue]
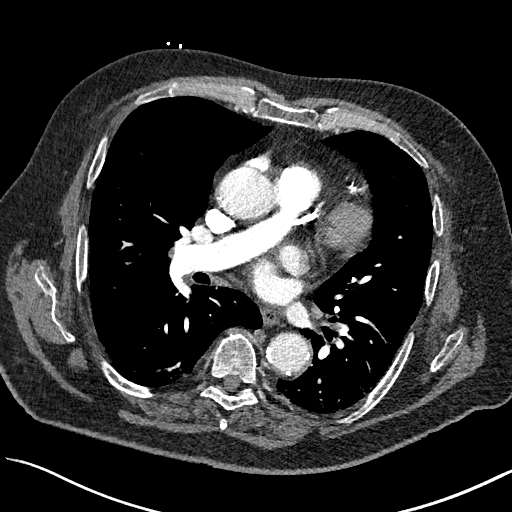
[im 189/311  lung]
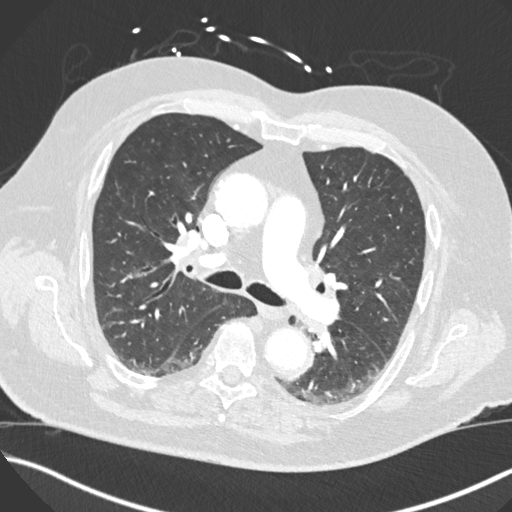
[im 203/311  soft-tissue]
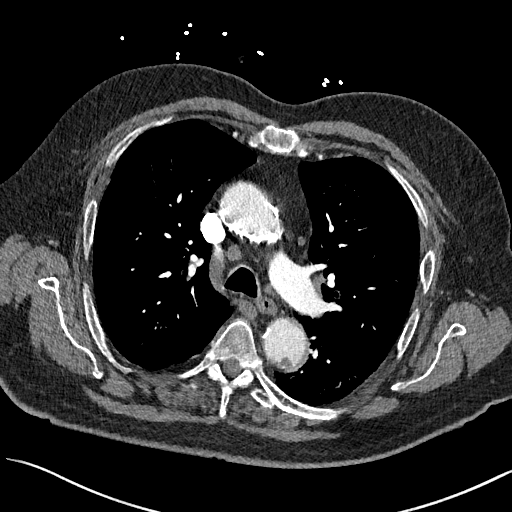
[im 230/311  lung]
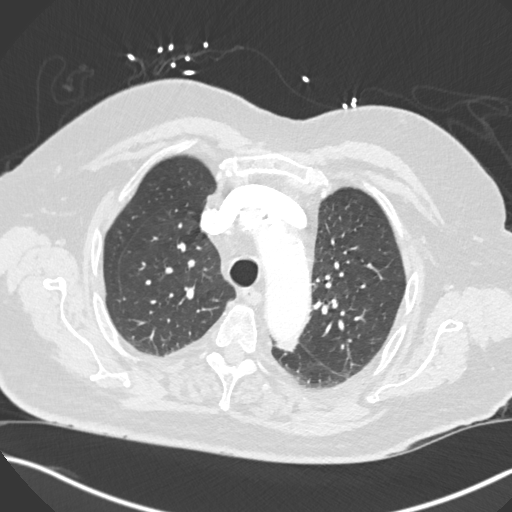
[im 257/311  soft-tissue]
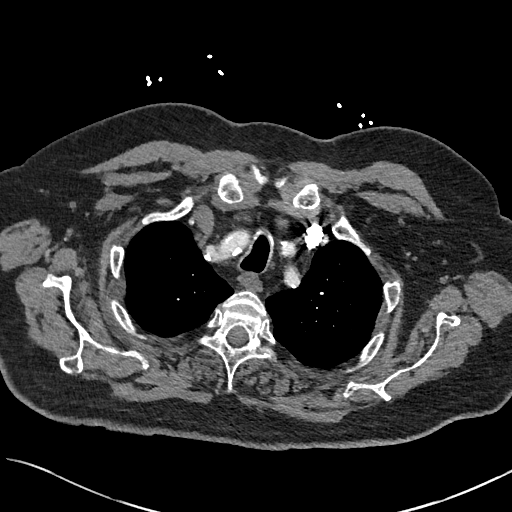
[im 270/311  lung]
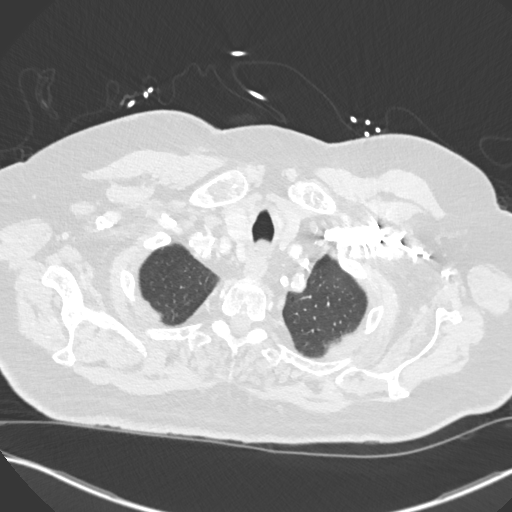
[im 297/311  soft-tissue]
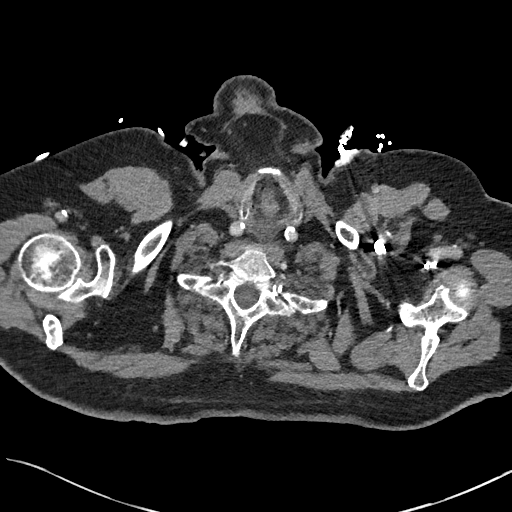

[Series 7: cor soft · coronal · 0.69mm/px · 3 of 151 slices shown]
[im 38/151  soft-tissue]
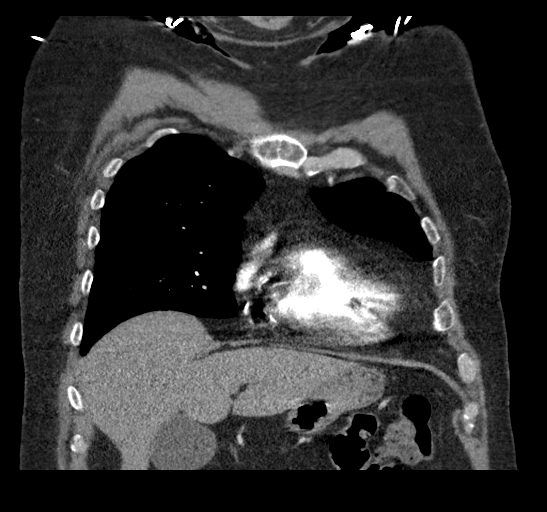
[im 76/151  soft-tissue]
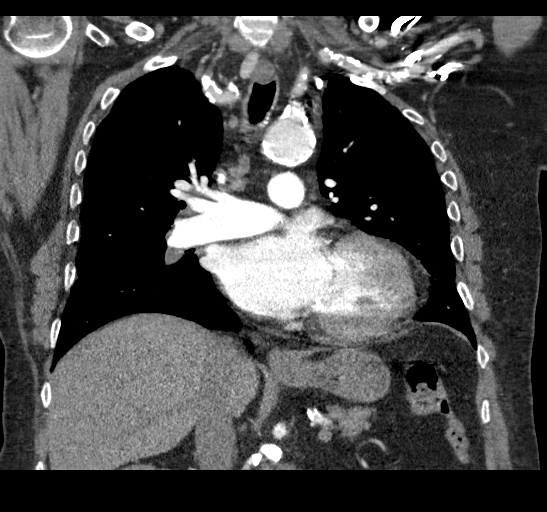
[im 113/151  soft-tissue]
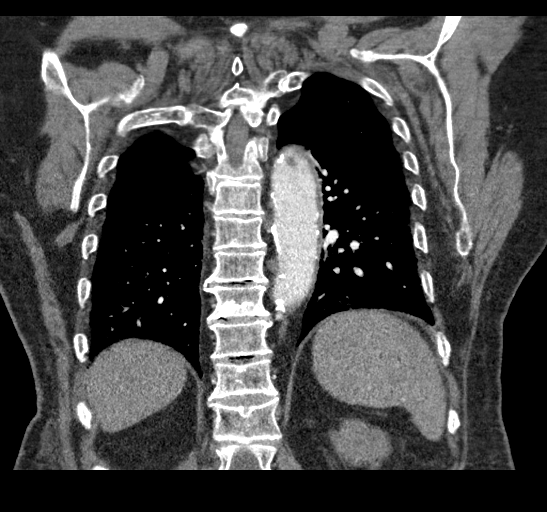

[17 of 46 positions shown; findings below may reference images not displayed]

FINDINGS: Cardiovascular: Satisfactory opacification of the pulmonary arteries
to the segmental level. A filling defect in a subsegmental pulmonary
arteries supplying the right middle lobe is consistent with an acute
pulmonary embolism (series 5, images 150 8-162). Significant
atherosclerotic calcifications are seen in the coronary arteries and
aortic arch. The heart is enlarged. No pericardial effusion.

Mediastinum/Nodes: There is mild bilateral hilar lymphadenopathy. No
enlarged mediastinal or axillary lymph nodes. Thyroid gland,
trachea, and esophagus demonstrate no significant findings.

Lungs/Pleura: There are ground-glass opacities and consolidation in
the left lower lobe near the lung base. There is mild bilateral
dependent atelectasis. There is no pleural effusion or pneumothorax.

Upper Abdomen: No acute abnormality.

Musculoskeletal: There is a chronic appearing anterior wedge
deformity of T12. Degenerative changes are seen in the spine.

Review of the MIP images confirms the above findings.
IMPRESSION: 1. Acute pulmonary embolism in a subsegmental pulmonary artery
supplying the right middle lobe.
2. Ground-glass opacities and consolidation in the left lower lobe
near the lung base likely represent pneumonia.
3. Mild bilateral hilar lymphadenopathy is likely reactive.

Aortic Atherosclerosis ([id number]-7GH.H).

ADDENDUM:
These results were called by telephone at the time of interpretation
on 05/08/2020 at [DATE] to provider RAVI TABOR , who verbally
acknowledged these results.

*** End of Addendum ***
FINDINGS: Cardiovascular: Satisfactory opacification of the pulmonary arteries
to the segmental level. A filling defect in a subsegmental pulmonary
arteries supplying the right middle lobe is consistent with an acute
pulmonary embolism (series 5, images 150 8-162). Significant
atherosclerotic calcifications are seen in the coronary arteries and
aortic arch. The heart is enlarged. No pericardial effusion.

Mediastinum/Nodes: There is mild bilateral hilar lymphadenopathy. No
enlarged mediastinal or axillary lymph nodes. Thyroid gland,
trachea, and esophagus demonstrate no significant findings.

Lungs/Pleura: There are ground-glass opacities and consolidation in
the left lower lobe near the lung base. There is mild bilateral
dependent atelectasis. There is no pleural effusion or pneumothorax.

Upper Abdomen: No acute abnormality.

Musculoskeletal: There is a chronic appearing anterior wedge
deformity of T12. Degenerative changes are seen in the spine.

Review of the MIP images confirms the above findings.
IMPRESSION: 1. Acute pulmonary embolism in a subsegmental pulmonary artery
supplying the right middle lobe.
2. Ground-glass opacities and consolidation in the left lower lobe
near the lung base likely represent pneumonia.
3. Mild bilateral hilar lymphadenopathy is likely reactive.

Aortic Atherosclerosis ([id number]-7GH.H).

## 2021-06-05 ENCOUNTER — Ambulatory Visit: Payer: Medicare Other | Admitting: Family Medicine

## 2021-06-07 ENCOUNTER — Other Ambulatory Visit: Payer: Self-pay | Admitting: Family Medicine

## 2021-06-07 ENCOUNTER — Encounter: Payer: Self-pay | Admitting: Family Medicine

## 2021-06-12 DIAGNOSIS — I639 Cerebral infarction, unspecified: Secondary | ICD-10-CM | POA: Diagnosis not present

## 2021-06-12 DIAGNOSIS — N183 Chronic kidney disease, stage 3 unspecified: Secondary | ICD-10-CM | POA: Diagnosis not present

## 2021-06-12 DIAGNOSIS — I129 Hypertensive chronic kidney disease with stage 1 through stage 4 chronic kidney disease, or unspecified chronic kidney disease: Secondary | ICD-10-CM | POA: Diagnosis not present

## 2021-06-12 DIAGNOSIS — I2510 Atherosclerotic heart disease of native coronary artery without angina pectoris: Secondary | ICD-10-CM | POA: Diagnosis not present

## 2021-06-12 DIAGNOSIS — I739 Peripheral vascular disease, unspecified: Secondary | ICD-10-CM | POA: Diagnosis not present

## 2021-06-12 DIAGNOSIS — I5020 Unspecified systolic (congestive) heart failure: Secondary | ICD-10-CM | POA: Diagnosis not present

## 2021-06-12 DIAGNOSIS — E872 Acidosis: Secondary | ICD-10-CM | POA: Diagnosis not present

## 2021-06-14 ENCOUNTER — Ambulatory Visit (INDEPENDENT_AMBULATORY_CARE_PROVIDER_SITE_OTHER): Payer: Medicare Other | Admitting: Family Medicine

## 2021-06-14 ENCOUNTER — Other Ambulatory Visit: Payer: Self-pay

## 2021-06-14 ENCOUNTER — Encounter: Payer: Self-pay | Admitting: Family Medicine

## 2021-06-14 VITALS — BP 146/70 | HR 87 | Temp 97.8°F | Wt 216.0 lb

## 2021-06-14 DIAGNOSIS — Z23 Encounter for immunization: Secondary | ICD-10-CM | POA: Diagnosis not present

## 2021-06-14 DIAGNOSIS — H53453 Other localized visual field defect, bilateral: Secondary | ICD-10-CM | POA: Diagnosis not present

## 2021-06-14 DIAGNOSIS — H66002 Acute suppurative otitis media without spontaneous rupture of ear drum, left ear: Secondary | ICD-10-CM | POA: Diagnosis not present

## 2021-06-14 DIAGNOSIS — H6592 Unspecified nonsuppurative otitis media, left ear: Secondary | ICD-10-CM | POA: Diagnosis not present

## 2021-06-14 MED ORDER — AMOXICILLIN-POT CLAVULANATE 875-125 MG PO TABS: 1.0000 | ORAL_TABLET | Freq: Two times a day (BID) | ORAL | 0 refills | Status: AC

## 2021-06-14 MED ORDER — FLUTICASONE PROPIONATE 50 MCG/ACT NA SUSP: 2.0000 | Freq: Every day | NASAL | 6 refills | Status: DC

## 2021-06-14 NOTE — Progress Notes (Signed)
Subjective:  Patient ID: Connor Bryant, male    DOB: July 16, 1946, 75 y.o.   MRN: [id number]  Patient Care Team: Claretta Fraise, MD as PCP - General (Family Medicine) Harl Bowie Alphonse Guild, MD as PCP - Cardiology (Cardiology)   Chief Complaint:  left ear hearing loss   HPI: EMETERIO Bryant is a 75 y.o. male presenting on 06/14/2021 for left ear hearing loss   Pt presents today with complaints of pressure in left ear and decreased hearing in left ear. Onset this morning. Denies other associated symptoms.    Relevant past medical, surgical, family, and social history reviewed and updated as indicated.  Allergies and medications reviewed and updated. Data reviewed: Chart in Epic.   Past Medical History:  Diagnosis Date   Acute CVA (cerebrovascular accident) ([place]) 05/01/2020   Acute respiratory failure with hypoxia ([place]) 05/04/2020   Arthritis    DM type 2 (diabetes mellitus, type 2) ([place])    no meds   Endotracheally intubated    Gallstone    Heart murmur    was told one time he had a murmur, not since 30 years ago   HTN (hypertension)    Hyperlipidemia    Personal history of colonic polyp-adenoma 11/24/2013   Renal insufficiency    mild   Thoracic spine fracture [facility])     Past Surgical History:  Procedure Laterality Date   ABDOMINAL AORTAGRAM N/A 02/23/2014   Procedure: ABDOMINAL [name];  Surgeon: Conrad Dayton, MD;  Location: [facility] CATH LAB;  Service: Cardiovascular;  Laterality: N/A;   COLONOSCOPY     DEBRIDEMENT TENNIS ELBOW Right    ESOPHAGOGASTRODUODENOSCOPY     MESENTERIC ARTERY BYPASS N/A 03/10/2014   Procedure: AORTO TO SUPERIOR MESENTERIC ARTERY BYPASS;  Surgeon: [name], MD;  Location: [facility] OR;  Service: Vascular;  Laterality: N/A;   TONSILLECTOMY AND ADENOIDECTOMY     vascualr surgery     illiac stents    Social History   Socioeconomic History   Marital status: Married    Spouse name: Not on file   Number of children: Not on file   Years of education:  Not on file   Highest education level: Not on file  Occupational History   Not on file  Tobacco Use   Smoking status: Former    Types: Cigarettes    Quit date: 07/08/2009    Years since quitting: 11.9   Smokeless tobacco: Never  Vaping Use   Vaping Use: Never used  Substance and Sexual Activity   Alcohol use: Yes    Alcohol/week: 12.0 standard drinks    Types: 12 Cans of beer per week    Comment: 8 beers a week   Drug use: Not Currently   Sexual activity: Not on file  Other Topics Concern   Not on file  Social History Narrative   Patient is married, he is retired. 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   Social Determinants of Health   Financial Resource Strain: Not on file  Food Insecurity: Not on file  Transportation Needs: Not on file  Physical Activity: Not on file  Stress: Not on file  Social Connections: Not on file  Intimate Partner Violence: Not on file    Outpatient Encounter Medications as of 06/14/2021  Medication Sig   amoxicillin-clavulanate (AUGMENTIN) 875-125 MG tablet Take 1 tablet by mouth 2 (two) times daily for 10 days.   fluticasone (FLONASE) 50 MCG/ACT nasal spray Place 2 sprays  into both nostrils daily.   allopurinol (ZYLOPRIM) 100 MG tablet TAKE 1 TABLET 2 TO 3 TIMES A DAY FOR GOUT   atorvastatin (LIPITOR) 80 MG tablet Take 1 tablet (80 mg total) by mouth daily.   carvedilol (COREG) 12.5 MG tablet TAKE  (1)  TABLET TWICE A DAY.   chlorthalidone (HYGROTON) 25 MG tablet Take 12.5 mg by mouth daily.   Coenzyme Q10 (COQ10) 200 MG CAPS Take by mouth daily.   ELIQUIS 5 MG TABS tablet TAKE ONE TABLET BY MOUTH TWICE DAILY   glimepiride (AMARYL) 2 MG tablet TAKE (1) TABLET DAILY IN THE MORNING.   hydrALAZINE (APRESOLINE) 100 MG tablet Take 1 tablet 3 times daily.   isosorbide mononitrate (IMDUR) 30 MG 24 hr tablet TAKE 1 TABLET DAILY   levocetirizine (XYZAL) 5 MG tablet Take 1 tablet (5 mg total) by mouth every evening.   lisinopril (ZESTRIL)  20 MG tablet Take 1 tablet (20 mg total) by mouth daily. (Patient taking differently: Take 40 mg by mouth daily.)   polyethylene glycol (MIRALAX / GLYCOLAX) packet Take 17 g by mouth daily. (Patient taking differently: Take 8.5 g by mouth daily as needed.)   No facility-administered encounter medications on file as of 06/14/2021.    No Known Allergies  Review of Systems  Constitutional:  Negative for activity change, appetite change, chills, diaphoresis, fatigue, fever and unexpected weight change.  HENT:  Positive for ear pain and hearing loss. Negative for congestion, dental problem, drooling, ear discharge, facial swelling, mouth sores, nosebleeds, postnasal drip, rhinorrhea, sinus pressure, sinus pain, sneezing, sore throat, tinnitus, trouble swallowing and voice change.   Eyes: Negative.  Negative for photophobia and visual disturbance.  Respiratory:  Negative for cough, chest tightness and shortness of breath.   Cardiovascular:  Negative for chest pain, palpitations and leg swelling.  Gastrointestinal:  Negative for abdominal pain, blood in stool, constipation, diarrhea, nausea and vomiting.  Endocrine: Negative.   Genitourinary:  Negative for decreased urine volume, difficulty urinating, dysuria, frequency and urgency.  Musculoskeletal:  Negative for arthralgias and myalgias.  Skin: Negative.   Allergic/Immunologic: Negative.   Neurological:  Negative for dizziness, tremors, seizures, syncope, facial asymmetry, speech difficulty, weakness, light-headedness, numbness and headaches.  Hematological: Negative.   Psychiatric/Behavioral:  Negative for confusion, hallucinations, sleep disturbance and suicidal ideas.   All other systems reviewed and are negative.      Objective:  BP (!) 146/70   Pulse 87   Temp 97.8 F (36.6 C)   Wt 216 lb (98 kg)   SpO2 97%   BMI 32.84 kg/m    Wt Readings from Last 3 Encounters:  06/14/21 216 lb (98 kg)  03/20/21 218 lb (98.9 kg)  03/02/21 214  lb 3.2 oz (97.2 kg)    Physical Exam Vitals and nursing note reviewed.  Constitutional:      General: He is not in acute distress.    Appearance: Normal appearance. He is well-developed and well-groomed. He is obese. He is not ill-appearing, toxic-appearing or diaphoretic.  HENT:     Head: Normocephalic and atraumatic.     Jaw: There is normal jaw occlusion.     Right Ear: Hearing, tympanic membrane, ear canal and external ear normal.     Left Ear: Decreased hearing noted. A middle ear effusion is present. Tympanic membrane is erythematous and bulging. Tympanic membrane is not perforated.     Ears:     Weber exam findings: Lateralizes left.    Left Rinne: BC > AC.  Nose: Nose normal.     Mouth/Throat:     Lips: Pink.     Mouth: Mucous membranes are moist.     Pharynx: Oropharynx is clear. Uvula midline.  Eyes:     General: Lids are normal.     Extraocular Movements: Extraocular movements intact.     Conjunctiva/sclera: Conjunctivae normal.     Pupils: Pupils are equal, round, and reactive to light.  Neck:     Thyroid: No thyroid mass, thyromegaly or thyroid tenderness.     Vascular: No carotid bruit or JVD.     Trachea: Trachea and phonation normal.  Cardiovascular:     Rate and Rhythm: Normal rate and regular rhythm.     Chest Wall: PMI is not displaced.     Pulses: Normal pulses.     Heart sounds: Normal heart sounds. No murmur heard.   No friction rub. No gallop.  Pulmonary:     Effort: Pulmonary effort is normal. No respiratory distress.     Breath sounds: Normal breath sounds. No wheezing.  Abdominal:     General: Bowel sounds are normal. There is no distension or abdominal bruit.     Palpations: Abdomen is soft. There is no hepatomegaly or splenomegaly.     Tenderness: There is no abdominal tenderness. There is no right CVA tenderness or left CVA tenderness.     Hernia: No hernia is present.  Musculoskeletal:        General: Normal range of motion.     Cervical  back: Normal range of motion and neck supple.     Right lower leg: No edema.     Left lower leg: No edema.  Lymphadenopathy:     Cervical: No cervical adenopathy.  Skin:    General: Skin is warm and dry.     Capillary Refill: Capillary refill takes less than 2 seconds.     Coloration: Skin is not cyanotic, jaundiced or pale.     Findings: No rash.  Neurological:     General: No focal deficit present.     Mental Status: He is alert and oriented to person, place, and time.     Cranial Nerves: Cranial nerves are intact.     Sensory: Sensation is intact.     Motor: Motor function is intact.     Coordination: Coordination is intact.     Gait: Gait is intact.     Deep Tendon Reflexes: Reflexes are normal and symmetric.  Psychiatric:        Attention and Perception: Attention and perception normal.        Mood and Affect: Mood and affect normal.        Speech: Speech normal.        Behavior: Behavior normal. Behavior is cooperative.        Thought Content: Thought content normal.        Cognition and Memory: Cognition and memory normal.        Judgment: Judgment normal.    Results for orders placed or performed in visit on 03/02/21  Bayer DCA Hb A1c Waived  Result Value Ref Range   HB A1C (BAYER DCA - WAIVED) 6.0 <7.0 %  CBC with Differential/Platelet  Result Value Ref Range   WBC 6.9 3.4 - 10.8 x10E3/uL   RBC 4.07 (L) 4.14 - 5.80 x10E6/uL   Hemoglobin 12.3 (L) 13.0 - 17.7 g/dL   Hematocrit 37.2 (L) 37.5 - 51.0 %   MCV 91 79 - 97 fL  MCH 30.2 26.6 - 33.0 pg   MCHC 33.1 31.5 - 35.7 g/dL   RDW 14.0 11.6 - 15.4 %   Platelets 200 150 - 450 x10E3/uL   Neutrophils 61 Not Estab. %   Lymphs 23 Not Estab. %   Monocytes 11 Not Estab. %   Eos 5 Not Estab. %   Basos 0 Not Estab. %   Neutrophils Absolute 4.1 1.4 - 7.0 x10E3/uL   Lymphocytes Absolute 1.6 0.7 - 3.1 x10E3/uL   Monocytes Absolute 0.8 0.1 - 0.9 x10E3/uL   EOS (ABSOLUTE) 0.4 0.0 - 0.4 x10E3/uL   Basophils Absolute 0.0 0.0  - 0.2 x10E3/uL   Immature Granulocytes 0 Not Estab. %   Immature Grans (Abs) 0.0 0.0 - 0.1 x10E3/uL  CMP14+EGFR  Result Value Ref Range   Glucose 86 65 - 99 mg/dL   BUN 37 (H) 8 - 27 mg/dL   Creatinine, Ser 1.45 (H) 0.76 - 1.27 mg/dL   eGFR 50 (L) >59 mL/min/1.73   BUN/Creatinine Ratio 26 (H) 10 - 24   Sodium 145 (H) 134 - 144 mmol/L   Potassium 4.6 3.5 - 5.2 mmol/L   Chloride 108 (H) 96 - 106 mmol/L   CO2 20 20 - 29 mmol/L   Calcium 9.3 8.6 - 10.2 mg/dL   Total Protein 7.1 6.0 - 8.5 g/dL   Albumin 4.4 3.7 - 4.7 g/dL   Globulin, Total 2.7 1.5 - 4.5 g/dL   Albumin/Globulin Ratio 1.6 1.2 - 2.2   Bilirubin Total 0.4 0.0 - 1.2 mg/dL   Alkaline Phosphatase 103 44 - 121 IU/L   AST 16 0 - 40 IU/L   ALT 20 0 - 44 IU/L  Lipid panel  Result Value Ref Range   Cholesterol, Total 147 100 - 199 mg/dL   Triglycerides 121 0 - 149 mg/dL   HDL 59 >39 mg/dL   VLDL Cholesterol Cal 21 5 - 40 mg/dL   LDL Chol Calc (NIH) 67 0 - 99 mg/dL   Chol/HDL Ratio 2.5 0.0 - 5.0 ratio  Thyroid Panel With TSH  Result Value Ref Range   TSH 2.940 0.450 - 4.500 uIU/mL   T4, Total 6.4 4.5 - 12.0 ug/dL   T3 Uptake Ratio 32 24 - 39 %   Free Thyroxine Index 2.0 1.2 - 4.9       Pertinent labs & imaging results that were available during my care of the patient were reviewed by me and considered in my medical decision making.  Assessment & Plan:  Authur was seen today for left ear hearing loss.  Diagnoses and all orders for this visit:  Non-recurrent acute suppurative otitis media of left ear without spontaneous rupture of tympanic membrane Acute otitis without rupture. Will treat with below. Pt aware to complete full course of antibiotics. Symptomatic care discussed in detail.  -     amoxicillin-clavulanate (AUGMENTIN) 875-125 MG tablet; Take 1 tablet by mouth 2 (two) times daily for 10 days.  Fluid level behind tympanic membrane of left ear Flonase as prescribed.  -     fluticasone (FLONASE) 50 MCG/ACT  nasal spray; Place 2 sprays into both nostrils daily.  Influenza vaccination given today.    Continue all other maintenance medications.  Follow up plan: Return in about 2 weeks (around 06/28/2021), or if symptoms worsen or fail to improve.   Continue healthy lifestyle choices, including diet (rich in fruits, vegetables, and lean proteins, and low in salt and simple carbohydrates) and exercise (at least 30 minutes  of moderate physical activity daily).  Educational handout given for otitis media  The above assessment and management plan was discussed with the patient. The patient verbalized understanding of and has agreed to the management plan. Patient is aware to call the clinic if they develop any new symptoms or if symptoms persist or worsen. Patient is aware when to return to the clinic for a follow-up visit. Patient educated on when it is appropriate to go to the emergency department.   Monia Pouch, FNP-C [facility] [phone]

## 2021-06-20 ENCOUNTER — Encounter: Payer: Self-pay | Admitting: Family Medicine

## 2021-06-20 ENCOUNTER — Ambulatory Visit (INDEPENDENT_AMBULATORY_CARE_PROVIDER_SITE_OTHER): Payer: Medicare Other | Admitting: Family Medicine

## 2021-06-20 ENCOUNTER — Other Ambulatory Visit: Payer: Self-pay

## 2021-06-20 VITALS — BP 172/68 | HR 74 | Ht 68.0 in | Wt 217.0 lb

## 2021-06-20 DIAGNOSIS — H9192 Unspecified hearing loss, left ear: Secondary | ICD-10-CM | POA: Diagnosis not present

## 2021-06-20 DIAGNOSIS — H6592 Unspecified nonsuppurative otitis media, left ear: Secondary | ICD-10-CM | POA: Diagnosis not present

## 2021-06-20 DIAGNOSIS — H938X2 Other specified disorders of left ear: Secondary | ICD-10-CM

## 2021-06-20 DIAGNOSIS — H6121 Impacted cerumen, right ear: Secondary | ICD-10-CM

## 2021-06-20 DIAGNOSIS — N183 Chronic kidney disease, stage 3 unspecified: Secondary | ICD-10-CM | POA: Diagnosis not present

## 2021-06-20 MED ORDER — METHYLPREDNISOLONE ACETATE 40 MG/ML IJ SUSP
40.0000 mg | Freq: Once | INTRAMUSCULAR | Status: AC
  Administered 2021-06-20: 40 mg via INTRAMUSCULAR

## 2021-06-20 NOTE — Progress Notes (Signed)
Subjective:  Patient ID: Connor Bryant, male    DOB: 1946/03/19, 75 y.o.   MRN: [id number]  Patient Care Team: Claretta Fraise, MD as PCP - General (Family Medicine) Harl Bowie Alphonse Guild, MD as PCP - Cardiology (Cardiology)   Chief Complaint:  ear infection   HPI: [name] is a 75 y.o. male presenting on 06/20/2021 for ear infection  Pt presents today for follow up of left ear pressure and decreased hearing in left ear. Onset 06/14/2021. Was started on Augmentin and Flonase at this time. He has been taking medications as prescribed. States pain has resolved but he still has pressure and decreased hearing in ear. No fever, chills, dizziness, tinnitus, nausea, or vomiting.   Relevant past medical, surgical, family, and social history reviewed and updated as indicated.  Allergies and medications reviewed and updated. Data reviewed: Chart in Epic.   Past Medical History:  Diagnosis Date   Acute CVA (cerebrovascular accident) ([place]) 05/01/2020   Acute respiratory failure with hypoxia ([place]) 05/04/2020   Arthritis    DM type 2 (diabetes mellitus, type 2) ([place])    no meds   Endotracheally intubated    Gallstone    Heart murmur    was told one time he had a murmur, not since 30 years ago   HTN (hypertension)    Hyperlipidemia    Personal history of colonic polyp-adenoma 11/24/2013   Renal insufficiency    mild   Thoracic spine fracture [facility])     Past Surgical History:  Procedure Laterality Date   ABDOMINAL AORTAGRAM N/A 02/23/2014   Procedure: ABDOMINAL [name];  Surgeon: Conrad Athens, MD;  Location: [facility] CATH LAB;  Service: Cardiovascular;  Laterality: N/A;   COLONOSCOPY     DEBRIDEMENT TENNIS ELBOW Right    ESOPHAGOGASTRODUODENOSCOPY     MESENTERIC ARTERY BYPASS N/A 03/10/2014   Procedure: AORTO TO SUPERIOR MESENTERIC ARTERY BYPASS;  Surgeon: [name], MD;  Location: [facility] OR;  Service: Vascular;  Laterality: N/A;   TONSILLECTOMY AND ADENOIDECTOMY     vascualr surgery      illiac stents    Social History   Socioeconomic History   Marital status: Married    Spouse name: Not on file   Number of children: Not on file   Years of education: Not on file   Highest education level: Not on file  Occupational History   Not on file  Tobacco Use   Smoking status: Former    Types: Cigarettes    Quit date: 07/08/2009    Years since quitting: 11.9   Smokeless tobacco: Never  Vaping Use   Vaping Use: Never used  Substance and Sexual Activity   Alcohol use: Yes    Alcohol/week: 12.0 standard drinks    Types: 12 Cans of beer per week    Comment: 8 beers a week   Drug use: Not Currently   Sexual activity: Not on file  Other Topics Concern   Not on file  Social History Narrative   Patient is married, he is retired. 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   Social Determinants of Health   Financial Resource Strain: Not on file  Food Insecurity: Not on file  Transportation Needs: Not on file  Physical Activity: Not on file  Stress: Not on file  Social Connections: Not on file  Intimate Partner Violence: Not on file    Outpatient Encounter Medications as of 06/20/2021  Medication Sig   allopurinol (  ZYLOPRIM) 100 MG tablet TAKE 1 TABLET 2 TO 3 TIMES A DAY FOR GOUT   amoxicillin-clavulanate (AUGMENTIN) 875-125 MG tablet Take 1 tablet by mouth 2 (two) times daily for 10 days.   atorvastatin (LIPITOR) 80 MG tablet Take 1 tablet (80 mg total) by mouth daily.   carvedilol (COREG) 12.5 MG tablet TAKE  (1)  TABLET TWICE A DAY.   chlorthalidone (HYGROTON) 25 MG tablet Take 12.5 mg by mouth daily.   Coenzyme Q10 (COQ10) 200 MG CAPS Take by mouth daily.   ELIQUIS 5 MG TABS tablet TAKE ONE TABLET BY MOUTH TWICE DAILY   fluticasone (FLONASE) 50 MCG/ACT nasal spray Place 2 sprays into both nostrils daily.   glimepiride (AMARYL) 2 MG tablet TAKE (1) TABLET DAILY IN THE MORNING.   hydrALAZINE (APRESOLINE) 100 MG tablet Take 1 tablet 3 times daily.    isosorbide mononitrate (IMDUR) 30 MG 24 hr tablet TAKE 1 TABLET DAILY   levocetirizine (XYZAL) 5 MG tablet Take 1 tablet (5 mg total) by mouth every evening.   lisinopril (ZESTRIL) 20 MG tablet Take 1 tablet (20 mg total) by mouth daily. (Patient taking differently: Take 40 mg by mouth daily.)   polyethylene glycol (MIRALAX / GLYCOLAX) packet Take 17 g by mouth daily. (Patient taking differently: Take 8.5 g by mouth daily as needed.)   No facility-administered encounter medications on file as of 06/20/2021.    No Known Allergies  Review of Systems  Constitutional:  Negative for activity change, appetite change, chills, diaphoresis, fatigue, fever and unexpected weight change.  HENT:  Positive for hearing loss.   Eyes: Negative.   Respiratory:  Negative for cough, chest tightness and shortness of breath.   Cardiovascular:  Negative for chest pain, palpitations and leg swelling.  Gastrointestinal:  Negative for blood in stool, constipation, diarrhea, nausea and vomiting.  Endocrine: Negative.   Genitourinary:  Negative for dysuria, frequency and urgency.  Musculoskeletal:  Negative for arthralgias and myalgias.  Skin: Negative.   Allergic/Immunologic: Negative.   Neurological:  Negative for dizziness, weakness and headaches.  Hematological: Negative.   Psychiatric/Behavioral:  Negative for confusion, hallucinations, sleep disturbance and suicidal ideas.   All other systems reviewed and are negative.      Objective:  BP (!) 172/68   Pulse 74   Ht 5' 8"  (1.727 m)   Wt 217 lb (98.4 kg)   SpO2 96%   BMI 32.99 kg/m    Wt Readings from Last 3 Encounters:  06/20/21 217 lb (98.4 kg)  06/14/21 216 lb (98 kg)  03/20/21 218 lb (98.9 kg)    Physical Exam Vitals and nursing note reviewed.  Constitutional:      General: He is not in acute distress.    Appearance: Normal appearance. He is well-developed and well-groomed. He is obese. He is not ill-appearing, toxic-appearing or  diaphoretic.  HENT:     Head: Normocephalic and atraumatic.     Jaw: There is normal jaw occlusion.     Right Ear: Hearing normal. There is impacted cerumen.     Left Ear: Ear canal and external ear normal. Decreased hearing noted. No laceration, drainage, swelling or tenderness. A middle ear effusion is present. There is no impacted cerumen. Tympanic membrane is bulging. Tympanic membrane is not perforated or erythematous.     Nose: Nose normal.     Mouth/Throat:     Lips: Pink.     Mouth: Mucous membranes are moist.     Pharynx: Oropharynx is clear. Uvula midline.  Eyes:     General: Lids are normal.     Extraocular Movements: Extraocular movements intact.     Conjunctiva/sclera: Conjunctivae normal.     Pupils: Pupils are equal, round, and reactive to light.  Neck:     Thyroid: No thyroid mass, thyromegaly or thyroid tenderness.     Vascular: No carotid bruit or JVD.     Trachea: Trachea and phonation normal.  Cardiovascular:     Rate and Rhythm: Normal rate and regular rhythm.     Chest Wall: PMI is not displaced.     Pulses: Normal pulses.     Heart sounds: Normal heart sounds. No murmur heard.   No friction rub. No gallop.  Pulmonary:     Effort: Pulmonary effort is normal. No respiratory distress.     Breath sounds: Normal breath sounds. No wheezing.  Abdominal:     General: Bowel sounds are normal. There is no distension or abdominal bruit.     Palpations: Abdomen is soft. There is no hepatomegaly or splenomegaly.     Tenderness: There is no abdominal tenderness. There is no right CVA tenderness or left CVA tenderness.     Hernia: No hernia is present.  Musculoskeletal:        General: Normal range of motion.     Cervical back: Normal range of motion and neck supple.     Right lower leg: No edema.     Left lower leg: No edema.  Lymphadenopathy:     Cervical: No cervical adenopathy.  Skin:    General: Skin is warm and dry.     Capillary Refill: Capillary refill takes  less than 2 seconds.     Coloration: Skin is not cyanotic, jaundiced or pale.     Findings: No rash.  Neurological:     General: No focal deficit present.     Mental Status: He is alert and oriented to person, place, and time.     Cranial Nerves: Cranial nerves are intact. No cranial nerve deficit.     Sensory: Sensation is intact.     Motor: Motor function is intact. No weakness.     Coordination: Coordination is intact.     Gait: Gait is intact. Gait normal.     Deep Tendon Reflexes: Reflexes are normal and symmetric.  Psychiatric:        Attention and Perception: Attention and perception normal.        Mood and Affect: Mood and affect normal.        Speech: Speech normal.        Behavior: Behavior normal. Behavior is cooperative.        Thought Content: Thought content normal.        Cognition and Memory: Cognition and memory normal.        Judgment: Judgment normal.    Results for orders placed or performed in visit on 03/02/21  Bayer DCA Hb A1c Waived  Result Value Ref Range   HB A1C (BAYER DCA - WAIVED) 6.0 <7.0 %  CBC with Differential/Platelet  Result Value Ref Range   WBC 6.9 3.4 - 10.8 x10E3/uL   RBC 4.07 (L) 4.14 - 5.80 x10E6/uL   Hemoglobin 12.3 (L) 13.0 - 17.7 g/dL   Hematocrit 37.2 (L) 37.5 - 51.0 %   MCV 91 79 - 97 fL   MCH 30.2 26.6 - 33.0 pg   MCHC 33.1 31.5 - 35.7 g/dL   RDW 14.0 11.6 - 15.4 %   Platelets 200  150 - 450 x10E3/uL   Neutrophils 61 Not Estab. %   Lymphs 23 Not Estab. %   Monocytes 11 Not Estab. %   Eos 5 Not Estab. %   Basos 0 Not Estab. %   Neutrophils Absolute 4.1 1.4 - 7.0 x10E3/uL   Lymphocytes Absolute 1.6 0.7 - 3.1 x10E3/uL   Monocytes Absolute 0.8 0.1 - 0.9 x10E3/uL   EOS (ABSOLUTE) 0.4 0.0 - 0.4 x10E3/uL   Basophils Absolute 0.0 0.0 - 0.2 x10E3/uL   Immature Granulocytes 0 Not Estab. %   Immature Grans (Abs) 0.0 0.0 - 0.1 x10E3/uL  CMP14+EGFR  Result Value Ref Range   Glucose 86 65 - 99 mg/dL   BUN 37 (H) 8 - 27 mg/dL    Creatinine, Ser 1.45 (H) 0.76 - 1.27 mg/dL   eGFR 50 (L) >59 mL/min/1.73   BUN/Creatinine Ratio 26 (H) 10 - 24   Sodium 145 (H) 134 - 144 mmol/L   Potassium 4.6 3.5 - 5.2 mmol/L   Chloride 108 (H) 96 - 106 mmol/L   CO2 20 20 - 29 mmol/L   Calcium 9.3 8.6 - 10.2 mg/dL   Total Protein 7.1 6.0 - 8.5 g/dL   Albumin 4.4 3.7 - 4.7 g/dL   Globulin, Total 2.7 1.5 - 4.5 g/dL   Albumin/Globulin Ratio 1.6 1.2 - 2.2   Bilirubin Total 0.4 0.0 - 1.2 mg/dL   Alkaline Phosphatase 103 44 - 121 IU/L   AST 16 0 - 40 IU/L   ALT 20 0 - 44 IU/L  Lipid panel  Result Value Ref Range   Cholesterol, Total 147 100 - 199 mg/dL   Triglycerides 121 0 - 149 mg/dL   HDL 59 >39 mg/dL   VLDL Cholesterol Cal 21 5 - 40 mg/dL   LDL Chol Calc (NIH) 67 0 - 99 mg/dL   Chol/HDL Ratio 2.5 0.0 - 5.0 ratio  Thyroid Panel With TSH  Result Value Ref Range   TSH 2.940 0.450 - 4.500 uIU/mL   T4, Total 6.4 4.5 - 12.0 ug/dL   T3 Uptake Ratio 32 24 - 39 %   Free Thyroxine Index 2.0 1.2 - 4.9     Ear Cerumen Removal  Date/Time: 06/20/2021 12:16 PM Performed by: Baruch Gouty, FNP Authorized by: Baruch Gouty, FNP   Anesthesia: Local Anesthetic: none Location details: right ear Patient tolerance: patient tolerated the procedure well with no immediate complications Comments: Unable to remove wax in office, pt will use debrox and return for removal next week.  Procedure type: irrigation (and curette)  Sedation: Patient sedated: no    Pertinent labs & imaging results that were available during my care of the patient were reviewed by me and considered in my medical decision making.  Assessment & Plan:  Laterrance was seen today for ear infection.  Diagnoses and all orders for this visit:  Decreased hearing of left ear Fluid level behind tympanic membrane of left ear Ear pressure, left Pt aware to complete full course of antibiotics. Continue Flonase. Can add oral antihistamine such as Zyrtec or Xyzal. Given  depo-medrol 40 mg IM in office today. Due to persistent symptoms, will refer to ENT.  -     Ambulatory referral to ENT  Impacted cerumen of right ear Cerumen removal attempted in office without success. Pt to use Debrox over the counter for next week and then return to office for removal next week.  -     Ear Cerumen Removal    Continue all  other maintenance medications.  Follow up plan: Return if symptoms worsen or fail to improve.   Continue healthy lifestyle choices, including diet (rich in fruits, vegetables, and lean proteins, and low in salt and simple carbohydrates) and exercise (at least 30 minutes of moderate physical activity daily).  Educational handout given for cerumen buildup   The above assessment and management plan was discussed with the patient. The patient verbalized understanding of and has agreed to the management plan. Patient is aware to call the clinic if they develop any new symptoms or if symptoms persist or worsen. Patient is aware when to return to the clinic for a follow-up visit. Patient educated on when it is appropriate to go to the emergency department.   Monia Pouch, FNP-C [facility] [phone]

## 2021-06-20 NOTE — Addendum Note (Signed)
Addended by: Zannie Cove on: 06/20/2021 12:35 PM   Modules accepted: Orders

## 2021-06-22 ENCOUNTER — Ambulatory Visit: Payer: Medicare Other | Admitting: Family Medicine

## 2021-06-29 ENCOUNTER — Ambulatory Visit (INDEPENDENT_AMBULATORY_CARE_PROVIDER_SITE_OTHER): Payer: Medicare Other | Admitting: Family Medicine

## 2021-06-29 ENCOUNTER — Other Ambulatory Visit: Payer: Self-pay

## 2021-06-29 ENCOUNTER — Encounter: Payer: Self-pay | Admitting: Family Medicine

## 2021-06-29 VITALS — BP 185/77 | HR 69 | Temp 97.7°F | Ht 68.0 in | Wt 216.0 lb

## 2021-06-29 DIAGNOSIS — H6121 Impacted cerumen, right ear: Secondary | ICD-10-CM

## 2021-06-29 NOTE — Progress Notes (Signed)
Subjective:  Patient ID: Connor Bryant, male    DOB: 01-May-1946, 75 y.o.   MRN: [id number]  Patient Care Team: Claretta Fraise, MD as PCP - General (Family Medicine) Harl Bowie Alphonse Guild, MD as PCP - Cardiology (Cardiology)   Chief Complaint:  Cerumen Impaction   HPI: Connor Bryant is a 75 y.o. male presenting on 06/29/2021 for Cerumen Impaction   Pt presents today for cerumen impaction of right ear. Attempted irrigation on 06/20/2021, unsuccessful. Pt has been using Debrox at home with some wax draining from ear. Hearing has improved slightly. No fever, pain, chills, dizziness, or headaches.    Relevant past medical, surgical, family, and social history reviewed and updated as indicated.  Allergies and medications reviewed and updated. Data reviewed: Chart in Epic.   Past Medical History:  Diagnosis Date   Acute CVA (cerebrovascular accident) ([place]) 05/01/2020   Acute respiratory failure with hypoxia ([place]) 05/04/2020   Arthritis    DM type 2 (diabetes mellitus, type 2) ([place])    no meds   Endotracheally intubated    Gallstone    Heart murmur    was told one time he had a murmur, not since 30 years ago   HTN (hypertension)    Hyperlipidemia    Personal history of colonic polyp-adenoma 11/24/2013   Renal insufficiency    mild   Thoracic spine fracture [facility])     Past Surgical History:  Procedure Laterality Date   ABDOMINAL AORTAGRAM N/A 02/23/2014   Procedure: ABDOMINAL [name];  Surgeon: Conrad Stone Lake, MD;  Location: [facility] CATH LAB;  Service: Cardiovascular;  Laterality: N/A;   COLONOSCOPY     DEBRIDEMENT TENNIS ELBOW Right    ESOPHAGOGASTRODUODENOSCOPY     MESENTERIC ARTERY BYPASS N/A 03/10/2014   Procedure: AORTO TO SUPERIOR MESENTERIC ARTERY BYPASS;  Surgeon: [name], MD;  Location: [facility] OR;  Service: Vascular;  Laterality: N/A;   TONSILLECTOMY AND ADENOIDECTOMY     vascualr surgery     illiac stents    Social History   Socioeconomic History   Marital status:  Married    Spouse name: Not on file   Number of children: Not on file   Years of education: Not on file   Highest education level: Not on file  Occupational History   Not on file  Tobacco Use   Smoking status: Former    Types: Cigarettes    Quit date: 07/08/2009    Years since quitting: 11.9   Smokeless tobacco: Never  Vaping Use   Vaping Use: Never used  Substance and Sexual Activity   Alcohol use: Yes    Alcohol/week: 12.0 standard drinks    Types: 12 Cans of beer per week    Comment: 8 beers a week   Drug use: Not Currently   Sexual activity: Not on file  Other Topics Concern   Not on file  Social History Narrative   Patient is married, he is retired. 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   Social Determinants of Health   Financial Resource Strain: Not on file  Food Insecurity: Not on file  Transportation Needs: Not on file  Physical Activity: Not on file  Stress: Not on file  Social Connections: Not on file  Intimate Partner Violence: Not on file    Outpatient Encounter Medications as of 06/29/2021  Medication Sig   allopurinol (ZYLOPRIM) 100 MG tablet TAKE 1 TABLET 2 TO 3 TIMES A DAY FOR GOUT  atorvastatin (LIPITOR) 80 MG tablet Take 1 tablet (80 mg total) by mouth daily.   carvedilol (COREG) 12.5 MG tablet TAKE  (1)  TABLET TWICE A DAY.   chlorthalidone (HYGROTON) 25 MG tablet Take 12.5 mg by mouth daily.   Coenzyme Q10 (COQ10) 200 MG CAPS Take by mouth daily.   ELIQUIS 5 MG TABS tablet TAKE ONE TABLET BY MOUTH TWICE DAILY   fluticasone (FLONASE) 50 MCG/ACT nasal spray Place 2 sprays into both nostrils daily.   glimepiride (AMARYL) 2 MG tablet TAKE (1) TABLET DAILY IN THE MORNING.   hydrALAZINE (APRESOLINE) 100 MG tablet Take 1 tablet 3 times daily.   isosorbide mononitrate (IMDUR) 30 MG 24 hr tablet TAKE 1 TABLET DAILY   levocetirizine (XYZAL) 5 MG tablet Take 1 tablet (5 mg total) by mouth every evening.   lisinopril (ZESTRIL) 40 MG tablet Take  40 mg by mouth daily.   polyethylene glycol (MIRALAX / GLYCOLAX) packet Take 17 g by mouth daily. (Patient taking differently: Take 8.5 g by mouth daily as needed.)   [DISCONTINUED] lisinopril (ZESTRIL) 20 MG tablet Take 1 tablet (20 mg total) by mouth daily. (Patient taking differently: Take 40 mg by mouth daily.)   No facility-administered encounter medications on file as of 06/29/2021.    No Known Allergies  Review of Systems  HENT:  Positive for ear discharge (wax from right ear) and hearing loss (right ear, improving).        Objective:  BP (!) 185/77   Pulse 69   Temp 97.7 F (36.5 C)   Ht 5' 8"  (1.727 m)   Wt 216 lb (98 kg)   SpO2 96%   BMI 32.84 kg/m    Wt Readings from Last 3 Encounters:  06/29/21 216 lb (98 kg)  06/20/21 217 lb (98.4 kg)  06/14/21 216 lb (98 kg)    Physical Exam Vitals and nursing note reviewed.  Constitutional:      General: He is not in acute distress.    Appearance: Normal appearance. He is well-developed and well-groomed. He is obese. He is not ill-appearing, toxic-appearing or diaphoretic.  HENT:     Head: Normocephalic and atraumatic.     Jaw: There is normal jaw occlusion.     Right Ear: Hearing normal. There is impacted cerumen.     Left Ear: Hearing normal. A middle ear effusion is present. Tympanic membrane is not perforated, erythematous, retracted or bulging.     Nose: Nose normal.     Mouth/Throat:     Lips: Pink.     Mouth: Mucous membranes are moist.     Pharynx: Oropharynx is clear. Uvula midline.  Eyes:     General: Lids are normal.     Extraocular Movements: Extraocular movements intact.     Conjunctiva/sclera: Conjunctivae normal.     Pupils: Pupils are equal, round, and reactive to light.  Neck:     Thyroid: No thyroid mass, thyromegaly or thyroid tenderness.     Vascular: No JVD.     Trachea: Trachea and phonation normal.  Cardiovascular:     Rate and Rhythm: Normal rate and regular rhythm.     Chest Wall: PMI is  not displaced.     Heart sounds: No murmur heard.   No friction rub. No gallop.  Pulmonary:     Effort: Pulmonary effort is normal. No respiratory distress.  Abdominal:     General: There is no distension or abdominal bruit.     Palpations: There is no  hepatomegaly or splenomegaly.     Tenderness: There is no abdominal tenderness. There is no right CVA tenderness or left CVA tenderness.     Hernia: No hernia is present.  Skin:    General: Skin is warm and dry.     Capillary Refill: Capillary refill takes less than 2 seconds.     Coloration: Skin is not cyanotic, jaundiced or pale.  Neurological:     General: No focal deficit present.     Mental Status: He is alert and oriented to person, place, and time.     Cranial Nerves: Cranial nerves are intact.     Sensory: Sensation is intact.     Motor: Motor function is intact.     Coordination: Coordination is intact.     Gait: Gait is intact.     Deep Tendon Reflexes: Reflexes are normal and symmetric.  Psychiatric:        Attention and Perception: Attention and perception normal.        Mood and Affect: Mood and affect normal.        Speech: Speech normal.        Behavior: Behavior normal. Behavior is cooperative.        Thought Content: Thought content normal.        Cognition and Memory: Cognition and memory normal.        Judgment: Judgment normal.    Results for orders placed or performed in visit on 03/02/21  Bayer DCA Hb A1c Waived  Result Value Ref Range   HB A1C (BAYER DCA - WAIVED) 6.0 <7.0 %  CBC with Differential/Platelet  Result Value Ref Range   WBC 6.9 3.4 - 10.8 x10E3/uL   RBC 4.07 (L) 4.14 - 5.80 x10E6/uL   Hemoglobin 12.3 (L) 13.0 - 17.7 g/dL   Hematocrit 37.2 (L) 37.5 - 51.0 %   MCV 91 79 - 97 fL   MCH 30.2 26.6 - 33.0 pg   MCHC 33.1 31.5 - 35.7 g/dL   RDW 14.0 11.6 - 15.4 %   Platelets 200 150 - 450 x10E3/uL   Neutrophils 61 Not Estab. %   Lymphs 23 Not Estab. %   Monocytes 11 Not Estab. %   Eos 5 Not  Estab. %   Basos 0 Not Estab. %   Neutrophils Absolute 4.1 1.4 - 7.0 x10E3/uL   Lymphocytes Absolute 1.6 0.7 - 3.1 x10E3/uL   Monocytes Absolute 0.8 0.1 - 0.9 x10E3/uL   EOS (ABSOLUTE) 0.4 0.0 - 0.4 x10E3/uL   Basophils Absolute 0.0 0.0 - 0.2 x10E3/uL   Immature Granulocytes 0 Not Estab. %   Immature Grans (Abs) 0.0 0.0 - 0.1 x10E3/uL  CMP14+EGFR  Result Value Ref Range   Glucose 86 65 - 99 mg/dL   BUN 37 (H) 8 - 27 mg/dL   Creatinine, Ser 1.45 (H) 0.76 - 1.27 mg/dL   eGFR 50 (L) >59 mL/min/1.73   BUN/Creatinine Ratio 26 (H) 10 - 24   Sodium 145 (H) 134 - 144 mmol/L   Potassium 4.6 3.5 - 5.2 mmol/L   Chloride 108 (H) 96 - 106 mmol/L   CO2 20 20 - 29 mmol/L   Calcium 9.3 8.6 - 10.2 mg/dL   Total Protein 7.1 6.0 - 8.5 g/dL   Albumin 4.4 3.7 - 4.7 g/dL   Globulin, Total 2.7 1.5 - 4.5 g/dL   Albumin/Globulin Ratio 1.6 1.2 - 2.2   Bilirubin Total 0.4 0.0 - 1.2 mg/dL   Alkaline Phosphatase 103 44 - 121  IU/L   AST 16 0 - 40 IU/L   ALT 20 0 - 44 IU/L  Lipid panel  Result Value Ref Range   Cholesterol, Total 147 100 - 199 mg/dL   Triglycerides 121 0 - 149 mg/dL   HDL 59 >39 mg/dL   VLDL Cholesterol Cal 21 5 - 40 mg/dL   LDL Chol Calc (NIH) 67 0 - 99 mg/dL   Chol/HDL Ratio 2.5 0.0 - 5.0 ratio  Thyroid Panel With TSH  Result Value Ref Range   TSH 2.940 0.450 - 4.500 uIU/mL   T4, Total 6.4 4.5 - 12.0 ug/dL   T3 Uptake Ratio 32 24 - 39 %   Free Thyroxine Index 2.0 1.2 - 4.9     Ear Cerumen Removal  Date/Time: 06/29/2021 2:28 PM Performed by: Baruch Gouty, FNP Authorized by: Baruch Gouty, FNP   Anesthesia: Local Anesthetic: none Ceruminolytics applied: Ceruminolytics applied prior to the procedure. Location details: right ear Patient tolerance: patient tolerated the procedure well with no immediate complications Comments: TM normal post cerumen removal  Procedure type: irrigation  Sedation: Patient sedated: no      Pertinent labs & imaging results that were  available during my care of the patient were reviewed by me and considered in my medical decision making.  Assessment & Plan:  Damarian was seen today for cerumen impaction.  Diagnoses and all orders for this visit:  Impacted cerumen of right ear Cerumen removal in office. Tolerated well. TM normal post cerumen removal.  -     Ear Cerumen Removal    Continue all other maintenance medications.  Follow up plan: Return if symptoms worsen or fail to improve.   Continue healthy lifestyle choices, including diet (rich in fruits, vegetables, and lean proteins, and low in salt and simple carbohydrates) and exercise (at least 30 minutes of moderate physical activity daily).   The above assessment and management plan was discussed with the patient. The patient verbalized understanding of and has agreed to the management plan. Patient is aware to call the clinic if they develop any new symptoms or if symptoms persist or worsen. Patient is aware when to return to the clinic for a follow-up visit. Patient educated on when it is appropriate to go to the emergency department.   Monia Pouch, FNP-C [facility] [phone]

## 2021-07-05 ENCOUNTER — Other Ambulatory Visit: Payer: Self-pay | Admitting: Family Medicine

## 2021-07-05 DIAGNOSIS — I10 Essential (primary) hypertension: Secondary | ICD-10-CM

## 2021-07-10 ENCOUNTER — Other Ambulatory Visit: Payer: Self-pay | Admitting: Family Medicine

## 2021-07-24 ENCOUNTER — Ambulatory Visit (INDEPENDENT_AMBULATORY_CARE_PROVIDER_SITE_OTHER): Payer: Medicare Other | Admitting: Family Medicine

## 2021-07-24 ENCOUNTER — Encounter: Payer: Self-pay | Admitting: Family Medicine

## 2021-07-24 ENCOUNTER — Other Ambulatory Visit: Payer: Self-pay

## 2021-07-24 VITALS — BP 172/73 | HR 71 | Temp 97.9°F | Ht 68.0 in | Wt 216.2 lb

## 2021-07-24 DIAGNOSIS — I10 Essential (primary) hypertension: Secondary | ICD-10-CM

## 2021-07-24 DIAGNOSIS — E7800 Pure hypercholesterolemia, unspecified: Secondary | ICD-10-CM | POA: Diagnosis not present

## 2021-07-24 DIAGNOSIS — E1122 Type 2 diabetes mellitus with diabetic chronic kidney disease: Secondary | ICD-10-CM

## 2021-07-24 LAB — BAYER DCA HB A1C WAIVED: HB A1C (BAYER DCA - WAIVED): 6 % — ABNORMAL HIGH (ref 4.8–5.6)

## 2021-07-24 MED ORDER — GLIMEPIRIDE 2 MG PO TABS: ORAL_TABLET | ORAL | 3 refills | Status: DC

## 2021-07-24 MED ORDER — AMLODIPINE BESYLATE 5 MG PO TABS: 5.0000 mg | ORAL_TABLET | Freq: Every day | ORAL | 5 refills | Status: DC

## 2021-07-24 MED ORDER — APIXABAN 5 MG PO TABS: 5.0000 mg | ORAL_TABLET | Freq: Two times a day (BID) | ORAL | 2 refills | Status: DC

## 2021-07-24 NOTE — Progress Notes (Signed)
Subjective:  Patient ID: Connor Bryant,  male    DOB: [birth year]  Age: 75 y.o.    CC: Medical Management of Chronic Issues   HPI Connor Bryant presents for  follow-up of hypertension. Patient has no history of headache chest pain or shortness of breath or recent cough. Patient also denies symptoms of TIA such as numbness weakness lateralizing. Patient denies side effects from medication. States taking it regularly. Under care of renal MD. BP there was 387 systolic and he increased the lisinopril to 40.   Patient also  in for follow-up of elevated cholesterol. Doing well without complaints on current medication. Denies side effects  including myalgia and arthralgia and nausea. Also in today for liver function testing. Currently no chest pain, shortness of breath or other cardiovascular related symptoms noted.  Follow-up of diabetes. Patient does not check blood sugar at home.Patient denies symptoms such as excessive hunger or urinary frequency, excessive hunger, nausea No significant hypoglycemic spells noted. Medications reviewed. Pt reports taking them regularly. Pt. denies complication/adverse reaction today.    History Connor Bryant has a past medical history of Acute CVA (cerebrovascular accident) ([place]) (05/01/2020), Acute respiratory failure with hypoxia ([place]) (05/04/2020), Arthritis, DM type 2 (diabetes mellitus, type 2) ([place]), Endotracheally intubated, Gallstone, Heart murmur, HTN (hypertension), Hyperlipidemia, Personal history of colonic polyp-adenoma (11/24/2013), Renal insufficiency, and Thoracic spine fracture ([place]).   He has a past surgical history that includes Debridement tennis elbow (Right); Tonsillectomy and adenoidectomy; Colonoscopy; Esophagogastroduodenoscopy; Mesenteric artery bypass (N/A, 03/10/2014); abdominal aortagram (N/A, 02/23/2014); and vascualr surgery.   His family history includes AAA (abdominal aortic aneurysm) in his mother; Bladder Cancer in his father; Cancer in his  father; Coronary artery disease in his father; Diabetes in his father and mother; Heart attack in his father; Heart disease in his father and mother; Hyperlipidemia in his father and mother; Hypertension in his father and mother.He reports that he quit smoking about 12 years ago. His smoking use included cigarettes. He has never used smokeless tobacco. He reports current alcohol use of about 12.0 standard drinks per week. He reports that he does not currently use drugs.  Current Outpatient Medications on File Prior to Visit  Medication Sig Dispense Refill   allopurinol (ZYLOPRIM) 100 MG tablet TAKE 1 TABLET 2 TO 3 TIMES A DAY FOR GOUT 90 tablet 3   atorvastatin (LIPITOR) 80 MG tablet Take 1 tablet (80 mg total) by mouth daily. 90 tablet 1   carvedilol (COREG) 12.5 MG tablet TAKE  (1)  TABLET TWICE A DAY. 180 tablet 3   chlorthalidone (HYGROTON) 25 MG tablet Take 12.5 mg by mouth daily.     Coenzyme Q10 (COQ10) 200 MG CAPS Take by mouth daily.     fluticasone (FLONASE) 50 MCG/ACT nasal spray Place 2 sprays into both nostrils daily. 16 g 6   hydrALAZINE (APRESOLINE) 100 MG tablet Take 1 tablet 3 times daily. 90 tablet 2   isosorbide mononitrate (IMDUR) 30 MG 24 hr tablet TAKE 1 TABLET DAILY 90 tablet 2   levocetirizine (XYZAL) 5 MG tablet Take 1 tablet (5 mg total) by mouth every evening. 90 tablet 3   lisinopril (ZESTRIL) 40 MG tablet Take 40 mg by mouth daily.     polyethylene glycol (MIRALAX / GLYCOLAX) packet Take 17 g by mouth daily. (Patient taking differently: Take 8.5 g by mouth daily as needed.) 30 packet 1   No current facility-administered medications on file prior to visit.    ROS Review of Systems  Constitutional:  Negative for fever.  Respiratory:  Negative for shortness of breath.   Cardiovascular:  Negative for chest pain.  Musculoskeletal:  Negative for arthralgias.  Skin:  Negative for rash.   Objective:  BP (!) 172/73   Pulse 71   Temp 97.9 F (36.6 C)   Ht 5' 8"   (1.727 m)   Wt 216 lb 3.2 oz (98.1 kg)   SpO2 98%   BMI 32.87 kg/m   BP Readings from Last 3 Encounters:  07/24/21 (!) 172/73  06/29/21 (!) 185/77  06/20/21 (!) 172/68    Wt Readings from Last 3 Encounters:  07/24/21 216 lb 3.2 oz (98.1 kg)  06/29/21 216 lb (98 kg)  06/20/21 217 lb (98.4 kg)     Physical Exam Vitals reviewed.  Constitutional:      Appearance: He is well-developed.  HENT:     Head: Normocephalic and atraumatic.     Right Ear: External ear normal.     Left Ear: External ear normal.     Mouth/Throat:     Pharynx: No oropharyngeal exudate or posterior oropharyngeal erythema.  Eyes:     Pupils: Pupils are equal, round, and reactive to light.  Cardiovascular:     Rate and Rhythm: Normal rate and regular rhythm.     Heart sounds: No murmur heard. Pulmonary:     Effort: No respiratory distress.     Breath sounds: Normal breath sounds.  Musculoskeletal:     Cervical back: Normal range of motion and neck supple.  Neurological:     Mental Status: He is alert and oriented to person, place, and time.    Diabetic Foot Exam - Simple   No data filed       Assessment & Plan:   Connor Bryant was seen today for medical management of chronic issues.  Diagnoses and all orders for this visit:  Type 2 diabetes mellitus with chronic kidney disease, without long-term current use of insulin, unspecified CKD stage (HCC) -     CBC with Differential/Platelet -     CMP14+EGFR -     Bayer DCA Hb A1c Waived  Essential hypertension -     CMP14+EGFR  Pure hypercholesterolemia -     Lipid panel  Other orders -     apixaban (ELIQUIS) 5 MG TABS tablet; Take 1 tablet (5 mg total) by mouth 2 (two) times daily. -     glimepiride (AMARYL) 2 MG tablet; TAKE (1) TABLET DAILY IN THE MORNING. -     amLODipine (NORVASC) 5 MG tablet; Take 1 tablet (5 mg total) by mouth daily. For blood pressure  I have changed Connor Pouch "[name]"'s Eliquis to apixaban. I am also having him start  on amLODipine. Additionally, I am having him maintain his polyethylene glycol, levocetirizine, chlorthalidone, CoQ10, atorvastatin, isosorbide mononitrate, allopurinol, carvedilol, fluticasone, lisinopril, hydrALAZINE, and glimepiride.  Meds ordered this encounter  Medications   apixaban (ELIQUIS) 5 MG TABS tablet    Sig: Take 1 tablet (5 mg total) by mouth 2 (two) times daily.    Dispense:  180 tablet    Refill:  2   glimepiride (AMARYL) 2 MG tablet    Sig: TAKE (1) TABLET DAILY IN THE MORNING.    Dispense:  90 tablet    Refill:  3   amLODipine (NORVASC) 5 MG tablet    Sig: Take 1 tablet (5 mg total) by mouth daily. For blood pressure    Dispense:  30 tablet    Refill:  5     Follow-up: Return in about 3 months (around 10/24/2021).  Claretta Fraise, M.D.

## 2021-07-25 ENCOUNTER — Other Ambulatory Visit: Payer: Self-pay | Admitting: Family Medicine

## 2021-07-25 LAB — CMP14+EGFR
ALT: 19 IU/L (ref 0–44)
AST: 16 IU/L (ref 0–40)
Albumin/Globulin Ratio: 1.6 (ref 1.2–2.2)
Albumin: 4.4 g/dL (ref 3.7–4.7)
Alkaline Phosphatase: 99 IU/L (ref 44–121)
BUN/Creatinine Ratio: 26 — ABNORMAL HIGH (ref 10–24)
BUN: 53 mg/dL — ABNORMAL HIGH (ref 8–27)
Bilirubin Total: 0.3 mg/dL (ref 0.0–1.2)
CO2: 21 mmol/L (ref 20–29)
Calcium: 9.3 mg/dL (ref 8.6–10.2)
Chloride: 109 mmol/L — ABNORMAL HIGH (ref 96–106)
Creatinine, Ser: 2.06 mg/dL — ABNORMAL HIGH (ref 0.76–1.27)
Globulin, Total: 2.7 g/dL (ref 1.5–4.5)
Glucose: 102 mg/dL — ABNORMAL HIGH (ref 70–99)
Potassium: 5.5 mmol/L — ABNORMAL HIGH (ref 3.5–5.2)
Sodium: 142 mmol/L (ref 134–144)
Total Protein: 7.1 g/dL (ref 6.0–8.5)
eGFR: 33 mL/min/{1.73_m2} — ABNORMAL LOW (ref >59)

## 2021-07-25 LAB — CBC WITH DIFFERENTIAL/PLATELET
Basophils Absolute: 0 10*3/uL (ref 0.0–0.2)
Basos: 1 %
EOS (ABSOLUTE): 0.4 10*3/uL (ref 0.0–0.4)
Eos: 5 %
Hematocrit: 39.4 % (ref 37.5–51.0)
Hemoglobin: 12.8 g/dL — ABNORMAL LOW (ref 13.0–17.7)
Immature Grans (Abs): 0 10*3/uL (ref 0.0–0.1)
Immature Granulocytes: 1 %
Lymphocytes Absolute: 1.7 10*3/uL (ref 0.7–3.1)
Lymphs: 24 %
MCH: 30 pg (ref 26.6–33.0)
MCHC: 32.5 g/dL (ref 31.5–35.7)
MCV: 93 fL (ref 79–97)
Monocytes Absolute: 0.8 10*3/uL (ref 0.1–0.9)
Monocytes: 11 %
Neutrophils Absolute: 4.2 10*3/uL (ref 1.4–7.0)
Neutrophils: 58 %
Platelets: 196 10*3/uL (ref 150–450)
RBC: 4.26 x10E6/uL (ref 4.14–5.80)
RDW: 14.2 % (ref 11.6–15.4)
WBC: 7 10*3/uL (ref 3.4–10.8)

## 2021-07-25 LAB — LIPID PANEL
Chol/HDL Ratio: 1.9 ratio (ref 0.0–5.0)
Cholesterol, Total: 141 mg/dL (ref 100–199)
HDL: 74 mg/dL (ref >39)
LDL Chol Calc (NIH): 52 mg/dL (ref 0–99)
Triglycerides: 77 mg/dL (ref 0–149)
VLDL Cholesterol Cal: 15 mg/dL (ref 5–40)

## 2021-07-25 MED ORDER — LISINOPRIL 20 MG PO TABS
20.0000 mg | ORAL_TABLET | Freq: Every day | ORAL | 11 refills | Status: DC
Start: 2021-07-25 — End: 2022-11-20

## 2021-07-26 ENCOUNTER — Telehealth: Payer: Self-pay | Admitting: Family Medicine

## 2021-07-26 ENCOUNTER — Other Ambulatory Visit: Payer: Self-pay | Admitting: *Deleted

## 2021-07-26 DIAGNOSIS — R7989 Other specified abnormal findings of blood chemistry: Secondary | ICD-10-CM

## 2021-07-26 NOTE — Telephone Encounter (Signed)
Hislab has changed significantly since then. He may want to check in with his kidney doctor.

## 2021-07-26 NOTE — Telephone Encounter (Signed)
PATIENT AWARE

## 2021-07-26 NOTE — Telephone Encounter (Signed)
Patient states he had labs for for kidney doctor a month ago and they told him labs were stable. And kidney doctor has him on current doses.

## 2021-08-01 ENCOUNTER — Other Ambulatory Visit: Payer: Medicare Other

## 2021-08-01 ENCOUNTER — Other Ambulatory Visit: Payer: Self-pay

## 2021-08-01 DIAGNOSIS — N183 Chronic kidney disease, stage 3 unspecified: Secondary | ICD-10-CM | POA: Diagnosis not present

## 2021-08-01 DIAGNOSIS — R7989 Other specified abnormal findings of blood chemistry: Secondary | ICD-10-CM

## 2021-08-22 ENCOUNTER — Telehealth: Payer: Self-pay | Admitting: Family Medicine

## 2021-08-22 NOTE — Telephone Encounter (Signed)
  Left message for patient to call back and schedule Medicare Annual Wellness Visit (AWV) to be completed by video or phone.  No hx of AWV eligible for AWVI as of 06/24/2014 awvi per [facility]   Please schedule at anytime with Craig --- Karle Starch  45 Minutes appointment   Any questions, please call me at [phone]

## 2021-08-29 ENCOUNTER — Other Ambulatory Visit: Payer: Self-pay | Admitting: Family Medicine

## 2021-08-29 DIAGNOSIS — E1169 Type 2 diabetes mellitus with other specified complication: Secondary | ICD-10-CM

## 2021-08-29 DIAGNOSIS — E1122 Type 2 diabetes mellitus with diabetic chronic kidney disease: Secondary | ICD-10-CM

## 2021-09-01 DIAGNOSIS — H5213 Myopia, bilateral: Secondary | ICD-10-CM | POA: Diagnosis not present

## 2021-09-01 DIAGNOSIS — H5340 Unspecified visual field defects: Secondary | ICD-10-CM | POA: Diagnosis not present

## 2021-09-01 DIAGNOSIS — H2513 Age-related nuclear cataract, bilateral: Secondary | ICD-10-CM | POA: Diagnosis not present

## 2021-09-01 DIAGNOSIS — E113291 Type 2 diabetes mellitus with mild nonproliferative diabetic retinopathy without macular edema, right eye: Secondary | ICD-10-CM | POA: Diagnosis not present

## 2021-09-01 LAB — HM DIABETES EYE EXAM

## 2021-09-21 ENCOUNTER — Other Ambulatory Visit: Payer: Self-pay

## 2021-09-21 ENCOUNTER — Encounter: Payer: Self-pay | Admitting: Cardiology

## 2021-09-21 ENCOUNTER — Ambulatory Visit: Payer: Medicare Other | Admitting: Cardiology

## 2021-09-21 VITALS — BP 162/78 | HR 76 | Ht 68.0 in | Wt 215.2 lb

## 2021-09-21 DIAGNOSIS — I10 Essential (primary) hypertension: Secondary | ICD-10-CM

## 2021-09-21 DIAGNOSIS — I350 Nonrheumatic aortic (valve) stenosis: Secondary | ICD-10-CM | POA: Diagnosis not present

## 2021-09-21 DIAGNOSIS — I4891 Unspecified atrial fibrillation: Secondary | ICD-10-CM | POA: Diagnosis not present

## 2021-09-21 DIAGNOSIS — Z8679 Personal history of other diseases of the circulatory system: Secondary | ICD-10-CM

## 2021-09-21 NOTE — Patient Instructions (Signed)
Medication Instructions:  Continue all current medications.   Labwork: none  Testing/Procedures: none  Follow-Up: 6 months   Any Other Special Instructions Will Be Listed Below (If Applicable).   If you need a refill on your cardiac medications before your next appointment, please call your pharmacy.  

## 2021-09-21 NOTE — Progress Notes (Signed)
Clinical Summary Mr. Semel is a 75 y.o.male seen today for follow up of the following medical problems.        1. NSTEMI/Stress induced CM - NSTEMI druing admission where he presented with CVA initially - peak trop 2500  - 04/2020 echo LVEF 35% (acute change from 05/02/20 echo) with apical akinesis. - possible acute plaque rupture vs coronary embolic event given presented with new onset afib and stroke vs. Stress induced CM/Takotsubo CM given echo findings - admission complicated by PE, hematuria, anemia, AKI given recent stroke and respiratory failure on ventilator did not pursue emergent cath   06/2020 echo LVEF 60-65%, no WMAs   -no chest pain, no SOB/DOE     2. Chronic sysotlic HF - new diagnosis during admission with CVA - - 04/2020 echo LVEF 35% (acute change from 05/02/20 echo) with apical akinesis.  - cath not pursued at that time due to CVA. Admission also complicated with PE, hematuria, anemia, AKI     06/2020 echo LVEF 60-65%, no WMAs - clincal scenario would suggest this may have been a stress induced CM in setting of his CVA     - no recent SOb/DOE - compliant with meds       2. PAF - new diagnosis during admission with CVA - 07/2020 monitor without recurrent afib     - infrequent palpitations, very brief - no bleeding on eliquis.    3. CVA - per pcp     4. PE - has been on eliquis   5. Anemia     6. PAD  s/p prior mesenteric artery bypass and recent bilateral iliac stenting at [facility] on 04/27/2020 - now off plavix, had noted some excessive gum bleeding. Just on eliquis   7. Aortic stenosis - mild to moderate by 06/2020 echo, AVA VTI 1.18 and mean grad 16.    8. HTN - home sbp's 110s-130s -significant white coat HTN history - changes by pcp and nephrology recently.    9. Carotid stenosis - noted on MRA - on medical therapy. - he reports has f/u with vacular at [facility]    10. CKD - followed by Dr Joelyn Oms Past Medical History:   Diagnosis Date   Acute CVA (cerebrovascular accident) ([place]) 05/01/2020   Acute respiratory failure with hypoxia ([place]) 05/04/2020   Arthritis    DM type 2 (diabetes mellitus, type 2) ([place])    no meds   Endotracheally intubated    Gallstone    Heart murmur    was told one time he had a murmur, not since 30 years ago   HTN (hypertension)    Hyperlipidemia    Personal history of colonic polyp-adenoma 11/24/2013   Renal insufficiency    mild   Thoracic spine fracture (HCC)      No Known Allergies   Current Outpatient Medications  Medication Sig Dispense Refill   allopurinol (ZYLOPRIM) 100 MG tablet TAKE 1 TABLET 2 TO 3 TIMES A DAY FOR GOUT 90 tablet 3   amLODipine (NORVASC) 5 MG tablet Take 1 tablet (5 mg total) by mouth daily. For blood pressure 30 tablet 5   apixaban (ELIQUIS) 5 MG TABS tablet Take 1 tablet (5 mg total) by mouth 2 (two) times daily. 180 tablet 2   atorvastatin (LIPITOR) 80 MG tablet TAKE ONE TABLET ONCE DAILY 90 tablet 1   carvedilol (COREG) 12.5 MG tablet TAKE  (1)  TABLET TWICE A DAY. 180 tablet 3   Coenzyme Q10 (COQ10)  200 MG CAPS Take by mouth daily.     fluticasone (FLONASE) 50 MCG/ACT nasal spray Place 2 sprays into both nostrils daily. 16 g 6   glimepiride (AMARYL) 2 MG tablet TAKE (1) TABLET DAILY IN THE MORNING. 90 tablet 3   hydrALAZINE (APRESOLINE) 100 MG tablet Take 1 tablet 3 times daily. 90 tablet 2   isosorbide mononitrate (IMDUR) 30 MG 24 hr tablet TAKE 1 TABLET DAILY 90 tablet 2   levocetirizine (XYZAL) 5 MG tablet Take 1 tablet (5 mg total) by mouth every evening. 90 tablet 3   lisinopril (ZESTRIL) 20 MG tablet Take 1 tablet (20 mg total) by mouth daily. 30 tablet 11   polyethylene glycol (MIRALAX / GLYCOLAX) packet Take 17 g by mouth daily. (Patient taking differently: Take 8.5 g by mouth daily as needed.) 30 packet 1   No current facility-administered medications for this visit.     Past Surgical History:  Procedure Laterality Date    ABDOMINAL AORTAGRAM N/A 02/23/2014   Procedure: ABDOMINAL AORTAGRAM;  Surgeon: Conrad Albert Lea, MD;  Location: [facility] CATH LAB;  Service: Cardiovascular;  Laterality: N/A;   COLONOSCOPY     DEBRIDEMENT TENNIS ELBOW Right    ESOPHAGOGASTRODUODENOSCOPY     MESENTERIC ARTERY BYPASS N/A 03/10/2014   Procedure: AORTO TO SUPERIOR MESENTERIC ARTERY BYPASS;  Surgeon: [name], MD;  Location: [facility] OR;  Service: Vascular;  Laterality: N/A;   TONSILLECTOMY AND ADENOIDECTOMY     vascualr surgery     illiac stents     No Known Allergies    Family History  Problem Relation Age of Onset   Hypertension Father    Coronary artery disease Father    Hyperlipidemia Father    Diabetes Father    Bladder Cancer Father    Cancer Father    Heart disease Father    Heart attack Father    Diabetes Mother    Heart disease Mother        before age 78   Hyperlipidemia Mother    Hypertension Mother    AAA (abdominal aortic aneurysm) Mother    Colon cancer Neg Hx    Throat cancer Neg Hx    Kidney disease Neg Hx    Liver disease Neg Hx      Social History Mr. Bero reports that he quit smoking about 12 years ago. His smoking use included cigarettes. He has never used smokeless tobacco. Mr. Mcclintock reports current alcohol use of about 12.0 standard drinks per week.   Review of Systems CONSTITUTIONAL: No weight loss, fever, chills, weakness or fatigue.  HEENT: Eyes: No visual loss, blurred vision, double vision or yellow sclerae.No hearing loss, sneezing, congestion, runny nose or sore throat.  SKIN: No rash or itching.  CARDIOVASCULAR: per hpi RESPIRATORY: No shortness of breath, cough or sputum.  GASTROINTESTINAL: No anorexia, nausea, vomiting or diarrhea. No abdominal pain or blood.  GENITOURINARY: No burning on urination, no polyuria NEUROLOGICAL: No headache, dizziness, syncope, paralysis, ataxia, numbness or tingling in the extremities. No change in bowel or bladder control.  MUSCULOSKELETAL: No  muscle, back pain, joint pain or stiffness.  LYMPHATICS: No enlarged nodes. No history of splenectomy.  PSYCHIATRIC: No history of depression or anxiety.  ENDOCRINOLOGIC: No reports of sweating, cold or heat intolerance. No polyuria or polydipsia.  Marland Kitchen   Physical Examination Today's Vitals   09/21/21 1513  BP: (!) 162/78  Pulse: 76  SpO2: 98%  Weight: 215 lb 3.2 oz (97.6 kg)  Height: 5\' 8"  (  1.727 m)   Body mass index is 32.72 kg/m.  Gen: resting comfortably, no acute distress HEENT: no scleral icterus, pupils equal round and reactive, no palptable cervical adenopathy,  CV: RRR, 3/6 systolic murmur rusb, no jvd Resp: Clear to auscultation bilaterally GI: abdomen is soft, non-tender, non-distended, normal bowel sounds, no hepatosplenomegaly MSK: extremities are warm, no edema.  Skin: warm, no rash Neuro:  no focal deficits Psych: appropriate affect   Diagnostic Studies     Assessment and Plan  1. History of cardiomyopathy - LVEF has normalize, likely stress induced CM -no recent symptoms, continue to monitor.    2.  HTN - white coat HTN history, home bp's are at goal.  - continue current meds, defer ACE-I dosing to nephrology.    3 Afib - no symptoms, continue current meds      4. Aortic stenosis - mild to moderate by last echo, - will repeat Fall 2023           Arnoldo Lenis, M.D.

## 2021-09-28 DIAGNOSIS — H2511 Age-related nuclear cataract, right eye: Secondary | ICD-10-CM | POA: Diagnosis not present

## 2021-09-28 DIAGNOSIS — H25011 Cortical age-related cataract, right eye: Secondary | ICD-10-CM | POA: Diagnosis not present

## 2021-09-28 DIAGNOSIS — H25811 Combined forms of age-related cataract, right eye: Secondary | ICD-10-CM | POA: Diagnosis not present

## 2021-09-28 DIAGNOSIS — H25041 Posterior subcapsular polar age-related cataract, right eye: Secondary | ICD-10-CM | POA: Diagnosis not present

## 2021-10-19 DIAGNOSIS — H2512 Age-related nuclear cataract, left eye: Secondary | ICD-10-CM | POA: Diagnosis not present

## 2021-10-19 DIAGNOSIS — H25012 Cortical age-related cataract, left eye: Secondary | ICD-10-CM | POA: Diagnosis not present

## 2021-10-19 DIAGNOSIS — H25812 Combined forms of age-related cataract, left eye: Secondary | ICD-10-CM | POA: Diagnosis not present

## 2021-10-24 ENCOUNTER — Encounter: Payer: Self-pay | Admitting: Family Medicine

## 2021-10-24 ENCOUNTER — Ambulatory Visit (INDEPENDENT_AMBULATORY_CARE_PROVIDER_SITE_OTHER): Payer: Medicare Other | Admitting: Family Medicine

## 2021-10-24 VITALS — BP 185/64 | HR 66 | Temp 97.3°F | Ht 68.0 in | Wt 213.2 lb

## 2021-10-24 DIAGNOSIS — I10 Essential (primary) hypertension: Secondary | ICD-10-CM

## 2021-10-24 DIAGNOSIS — E1122 Type 2 diabetes mellitus with diabetic chronic kidney disease: Secondary | ICD-10-CM

## 2021-10-24 DIAGNOSIS — E7800 Pure hypercholesterolemia, unspecified: Secondary | ICD-10-CM | POA: Diagnosis not present

## 2021-10-24 LAB — LIPID PANEL

## 2021-10-24 LAB — BAYER DCA HB A1C WAIVED: HB A1C (BAYER DCA - WAIVED): 5.9 % — ABNORMAL HIGH (ref 4.8–5.6)

## 2021-10-24 MED ORDER — HYDRALAZINE HCL 100 MG PO TABS: 100.0000 mg | ORAL_TABLET | Freq: Three times a day (TID) | ORAL | 3 refills | Status: DC

## 2021-10-24 MED ORDER — AMLODIPINE BESYLATE 5 MG PO TABS: 5.0000 mg | ORAL_TABLET | Freq: Every day | ORAL | 3 refills | Status: DC

## 2021-10-24 NOTE — Progress Notes (Signed)
Subjective:  Patient ID: Connor Bryant,  male    DOB: [birth year]  Age: 76 y.o.    CC: Medical Management of Chronic Issues   HPI Connor Bryant presents for  follow-up of hypertension. Patient has no history of headache chest pain or shortness of breath or recent cough. Patient also denies symptoms of TIA such as numbness weakness lateralizing. Patient denies side effects from medication. States taking it regularly.BP at home runs 130/70.  Patient also  in for follow-up of elevated cholesterol. Doing well without complaints on current medication. Denies side effects  including myalgia and arthralgia and nausea. Also in today for liver function testing. Currently no chest pain, shortness of breath or other cardiovascular related symptoms noted.  Follow-up of diabetes. Patient does not check blood sugar at home. Patient denies symptoms such as excessive hunger or urinary frequency, excessive hunger, nausea No significant hypoglycemic spells noted. Medications reviewed. Pt reports taking them regularly. Pt. denies complication/adverse reaction today.   Recent bilateral cataract surgery. Can see great.   History Connor Bryant has a past medical history of Acute CVA (cerebrovascular accident) ([place]) (05/01/2020), Acute respiratory failure with hypoxia ([place]) (05/04/2020), Arthritis, DM type 2 (diabetes mellitus, type 2) ([place]), Endotracheally intubated, Gallstone, Heart murmur, HTN (hypertension), Hyperlipidemia, Personal history of colonic polyp-adenoma (11/24/2013), Renal insufficiency, and Thoracic spine fracture ([place]).   He has a past surgical history that includes Debridement tennis elbow (Right); Tonsillectomy and adenoidectomy; Colonoscopy; Esophagogastroduodenoscopy; Mesenteric artery bypass (N/A, 03/10/2014); abdominal aortagram (N/A, 02/23/2014); vascualr surgery; and Cataract extraction, bilateral.   His family history includes AAA (abdominal aortic aneurysm) in his mother; Bladder Cancer in his  father; Cancer in his father; Coronary artery disease in his father; Diabetes in his father and mother; Heart attack in his father; Heart disease in his father and mother; Hyperlipidemia in his father and mother; Hypertension in his father and mother.He reports that he quit smoking about 12 years ago. His smoking use included cigarettes. He has never used smokeless tobacco. He reports current alcohol use of about 12.0 standard drinks per week. He reports that he does not currently use drugs.  Current Outpatient Medications on File Prior to Visit  Medication Sig Dispense Refill   allopurinol (ZYLOPRIM) 100 MG tablet TAKE 1 TABLET 2 TO 3 TIMES A DAY FOR GOUT 90 tablet 3   apixaban (ELIQUIS) 5 MG TABS tablet Take 1 tablet (5 mg total) by mouth 2 (two) times daily. 180 tablet 2   atorvastatin (LIPITOR) 80 MG tablet TAKE ONE TABLET ONCE DAILY 90 tablet 1   carvedilol (COREG) 12.5 MG tablet TAKE  (1)  TABLET TWICE A DAY. 180 tablet 3   fluticasone (FLONASE) 50 MCG/ACT nasal spray Place 2 sprays into both nostrils daily. 16 g 6   glimepiride (AMARYL) 2 MG tablet TAKE (1) TABLET DAILY IN THE MORNING. 90 tablet 3   isosorbide mononitrate (IMDUR) 30 MG 24 hr tablet TAKE 1 TABLET DAILY 90 tablet 2   levocetirizine (XYZAL) 5 MG tablet Take 1 tablet (5 mg total) by mouth every evening. 90 tablet 3   lisinopril (ZESTRIL) 20 MG tablet Take 1 tablet (20 mg total) by mouth daily. 30 tablet 11   polyethylene glycol (MIRALAX / GLYCOLAX) packet Take 17 g by mouth daily. (Patient taking differently: Take 8.5 g by mouth daily as needed.) 30 packet 1   Coenzyme Q10 (COQ10) 200 MG CAPS Take by mouth daily. (Patient not taking: Reported on 09/21/2021)     No current facility-administered  medications on file prior to visit.    ROS Review of Systems  Constitutional:  Negative for fever.  Respiratory:  Negative for shortness of breath.   Cardiovascular:  Negative for chest pain.  Musculoskeletal:  Negative for  arthralgias.  Skin:  Negative for rash.   Objective:  BP (!) 185/64    Pulse 66    Temp (!) 97.3 F (36.3 C)    Ht 5' 8"  (1.727 m)    Wt 213 lb 3.2 oz (96.7 kg)    SpO2 98%    BMI 32.42 kg/m   BP Readings from Last 3 Encounters:  10/24/21 (!) 185/64  09/21/21 (!) 162/78  07/24/21 (!) 172/73    Wt Readings from Last 3 Encounters:  10/24/21 213 lb 3.2 oz (96.7 kg)  09/21/21 215 lb 3.2 oz (97.6 kg)  07/24/21 216 lb 3.2 oz (98.1 kg)     Physical Exam Vitals reviewed.  Constitutional:      Appearance: He is well-developed.  HENT:     Head: Normocephalic and atraumatic.     Right Ear: External ear normal.     Left Ear: External ear normal.     Mouth/Throat:     Pharynx: No oropharyngeal exudate or posterior oropharyngeal erythema.  Eyes:     Pupils: Pupils are equal, round, and reactive to light.  Cardiovascular:     Rate and Rhythm: Normal rate and regular rhythm.     Heart sounds: No murmur heard. Pulmonary:     Effort: No respiratory distress.     Breath sounds: Normal breath sounds.  Musculoskeletal:     Cervical back: Normal range of motion and neck supple.  Neurological:     Mental Status: He is alert and oriented to person, place, and time.    Diabetic Foot Exam - Simple   No data filed       Assessment & Plan:   Connor Bryant was seen today for medical management of chronic issues.  Diagnoses and all orders for this visit:  Type 2 diabetes mellitus with chronic kidney disease, without long-term current use of insulin, unspecified CKD stage (HCC) -     Bayer DCA Hb A1c Waived  Essential hypertension -     CBC with Differential/Platelet -     CMP14+EGFR -     hydrALAZINE (APRESOLINE) 100 MG tablet; Take 1 tablet (100 mg total) by mouth 3 (three) times daily.  Pure hypercholesterolemia -     Lipid panel  Other orders -     amLODipine (NORVASC) 5 MG tablet; Take 1 tablet (5 mg total) by mouth daily. For blood pressure   I have changed Connor Pouch  "[name]"'s hydrALAZINE. I am also having him maintain his polyethylene glycol, levocetirizine, CoQ10, isosorbide mononitrate, allopurinol, carvedilol, fluticasone, apixaban, glimepiride, lisinopril, atorvastatin, and amLODipine.  Meds ordered this encounter  Medications   hydrALAZINE (APRESOLINE) 100 MG tablet    Sig: Take 1 tablet (100 mg total) by mouth 3 (three) times daily.    Dispense:  270 tablet    Refill:  3   amLODipine (NORVASC) 5 MG tablet    Sig: Take 1 tablet (5 mg total) by mouth daily. For blood pressure    Dispense:  90 tablet    Refill:  3     Follow-up: Return in about 3 months (around 01/21/2022).  Claretta Fraise, M.D.

## 2021-10-25 LAB — CMP14+EGFR
ALT: 26 IU/L (ref 0–44)
AST: 18 IU/L (ref 0–40)
Albumin/Globulin Ratio: 1.8 (ref 1.2–2.2)
Albumin: 4.3 g/dL (ref 3.7–4.7)
Alkaline Phosphatase: 107 IU/L (ref 44–121)
BUN/Creatinine Ratio: 18 (ref 10–24)
BUN: 26 mg/dL (ref 8–27)
Bilirubin Total: 0.3 mg/dL (ref 0.0–1.2)
CO2: 24 mmol/L (ref 20–29)
Calcium: 9.4 mg/dL (ref 8.6–10.2)
Chloride: 104 mmol/L (ref 96–106)
Creatinine, Ser: 1.47 mg/dL — ABNORMAL HIGH (ref 0.76–1.27)
Globulin, Total: 2.4 g/dL (ref 1.5–4.5)
Glucose: 91 mg/dL (ref 70–99)
Potassium: 4.2 mmol/L (ref 3.5–5.2)
Sodium: 142 mmol/L (ref 134–144)
Total Protein: 6.7 g/dL (ref 6.0–8.5)
eGFR: 49 mL/min/{1.73_m2} — ABNORMAL LOW (ref >59)

## 2021-10-25 LAB — CBC WITH DIFFERENTIAL/PLATELET
Basophils Absolute: 0 10*3/uL (ref 0.0–0.2)
Basos: 1 %
EOS (ABSOLUTE): 0.4 10*3/uL (ref 0.0–0.4)
Eos: 6 %
Hematocrit: 38.7 % (ref 37.5–51.0)
Hemoglobin: 12.8 g/dL — ABNORMAL LOW (ref 13.0–17.7)
Immature Grans (Abs): 0 10*3/uL (ref 0.0–0.1)
Immature Granulocytes: 0 %
Lymphocytes Absolute: 1.8 10*3/uL (ref 0.7–3.1)
Lymphs: 26 %
MCH: 30.6 pg (ref 26.6–33.0)
MCHC: 33.1 g/dL (ref 31.5–35.7)
MCV: 93 fL (ref 79–97)
Monocytes Absolute: 0.7 10*3/uL (ref 0.1–0.9)
Monocytes: 10 %
Neutrophils Absolute: 4 10*3/uL (ref 1.4–7.0)
Neutrophils: 57 %
Platelets: 203 10*3/uL (ref 150–450)
RBC: 4.18 x10E6/uL (ref 4.14–5.80)
RDW: 12.7 % (ref 11.6–15.4)
WBC: 6.9 10*3/uL (ref 3.4–10.8)

## 2021-10-25 LAB — LIPID PANEL
Chol/HDL Ratio: 2.7 ratio (ref 0.0–5.0)
Cholesterol, Total: 138 mg/dL (ref 100–199)
HDL: 52 mg/dL (ref >39)
LDL Chol Calc (NIH): 60 mg/dL (ref 0–99)
Triglycerides: 154 mg/dL — ABNORMAL HIGH (ref 0–149)
VLDL Cholesterol Cal: 26 mg/dL (ref 5–40)

## 2021-10-25 NOTE — Progress Notes (Signed)
Hello Mykel,  Your lab result is normal and/or stable.Some minor variations that are not significant are commonly marked abnormal, but do not represent any medical problem for you.  Best regards, Fallou Hulbert, M.D.

## 2021-10-30 DIAGNOSIS — I739 Peripheral vascular disease, unspecified: Secondary | ICD-10-CM | POA: Diagnosis not present

## 2021-10-30 DIAGNOSIS — Z95820 Peripheral vascular angioplasty status with implants and grafts: Secondary | ICD-10-CM | POA: Diagnosis not present

## 2021-11-27 ENCOUNTER — Ambulatory Visit (INDEPENDENT_AMBULATORY_CARE_PROVIDER_SITE_OTHER): Payer: Medicare Other

## 2021-11-27 VITALS — Ht 68.0 in | Wt 213.0 lb

## 2021-11-27 DIAGNOSIS — Z0000 Encounter for general adult medical examination without abnormal findings: Secondary | ICD-10-CM

## 2021-11-27 NOTE — Progress Notes (Signed)
Subjective:   Connor Bryant is a 76 y.o. male who presents for an Initial Medicare Annual Wellness Visit. Virtual Visit via Telephone Note  I connected with  Arlana Pouch on 11/27/21 at  2:45 PM EST by telephone and verified that I am speaking with the correct person using two identifiers.  Location: Patient: HOME Provider: [facility] Persons participating in the virtual visit: patient/Nurse Health Advisor   I discussed the limitations, risks, security and privacy concerns of performing an evaluation and management service by telephone and the availability of in person appointments. The patient expressed understanding and agreed to proceed.  Interactive audio and video telecommunications were attempted between this nurse and patient, however failed, due to patient having technical difficulties OR patient did not have access to video capability.  We continued and completed visit with audio only.  Some vital signs may be absent or patient reported.   Chriss Driver, LPN  Review of Systems     Cardiac Risk Factors include: advanced age (>90mn, >>62women);hypertension;male gender;sedentary lifestyle;obesity (BMI >30kg/m2)     Objective:    Today's Vitals   11/27/21 1453  Weight: 213 lb (96.6 kg)  Height: '5\' 8"'$  (1.727 m)   Body mass index is 32.39 kg/m.  Advanced Directives 11/27/2021 05/02/2020 05/01/2020 10/01/2019 06/02/2019 09/30/2018 09/30/2018  Does Patient Have a Medical Advance Directive? No No No No No No No  Would patient like information on creating a medical advance directive? No - Patient declined No - Patient declined No - Patient declined - No - Patient declined No - Patient declined -  Pre-existing out of facility DNR order (yellow form or pink MOST form) - - - - - - -    Current Medications (verified) Outpatient Encounter Medications as of 11/27/2021  Medication Sig   allopurinol (ZYLOPRIM) 100 MG tablet TAKE 1 TABLET 2 TO 3 TIMES A DAY FOR GOUT   amLODipine (NORVASC) 5  MG tablet Take 1 tablet (5 mg total) by mouth daily. For blood pressure   apixaban (ELIQUIS) 5 MG TABS tablet Take 1 tablet (5 mg total) by mouth 2 (two) times daily.   atorvastatin (LIPITOR) 80 MG tablet TAKE ONE TABLET ONCE DAILY   carvedilol (COREG) 12.5 MG tablet TAKE  (1)  TABLET TWICE A DAY.   Coenzyme Q10 (COQ10) 200 MG CAPS Take by mouth daily.   fluticasone (FLONASE) 50 MCG/ACT nasal spray Place 2 sprays into both nostrils daily.   glimepiride (AMARYL) 2 MG tablet TAKE (1) TABLET DAILY IN THE MORNING.   hydrALAZINE (APRESOLINE) 100 MG tablet Take 1 tablet (100 mg total) by mouth 3 (three) times daily.   isosorbide mononitrate (IMDUR) 30 MG 24 hr tablet TAKE 1 TABLET DAILY   levocetirizine (XYZAL) 5 MG tablet Take 1 tablet (5 mg total) by mouth every evening.   lisinopril (ZESTRIL) 20 MG tablet Take 1 tablet (20 mg total) by mouth daily.   polyethylene glycol (MIRALAX / GLYCOLAX) packet Take 17 g by mouth daily. (Patient taking differently: Take 8.5 g by mouth daily as needed.)   No facility-administered encounter medications on file as of 11/27/2021.    Allergies (verified) Patient has no known allergies.   History: Past Medical History:  Diagnosis Date   Acute CVA (cerebrovascular accident) ([facility] 05/01/2020   Acute respiratory failure with hypoxia ([facility] 05/04/2020   Arthritis    DM type 2 (diabetes mellitus, type 2) ([facility]    no meds   Endotracheally intubated  Gallstone    Heart murmur    was told one time he had a murmur, not since 30 years ago   HTN (hypertension)    Hyperlipidemia    Personal history of colonic polyp-adenoma 11/24/2013   Renal insufficiency    mild   Thoracic spine fracture [facility])    Past Surgical History:  Procedure Laterality Date   ABDOMINAL AORTAGRAM N/A 02/23/2014   Procedure: ABDOMINAL [name];  Surgeon: Conrad Lewiston, MD;  Location[facility] CATH LAB;  Service: Cardiovascular;  Laterality: N/A;   CATARACT EXTRACTION, BILATERAL     COLONOSCOPY      DEBRIDEMENT TENNIS ELBOW Right    ESOPHAGOGASTRODUODENOSCOPY     MESENTERIC ARTERY BYPASS N/A 03/10/2014   Procedure: AORTO TO SUPERIOR MESENTERIC ARTERY BYPASS;  Surgeon: [name], MD;  Location: [facility] OR;  Service: Vascular;  Laterality: N/A;   TONSILLECTOMY AND ADENOIDECTOMY     vascualr surgery     illiac stents   Family History  Problem Relation Age of Onset   Hypertension Father    Coronary artery disease Father    Hyperlipidemia Father    Diabetes Father    Bladder Cancer Father    Cancer Father    Heart disease Father    Heart attack Father    Diabetes Mother    Heart disease Mother        before age 39   Hyperlipidemia Mother    Hypertension Mother    AAA (abdominal aortic aneurysm) Mother    Colon cancer Neg Hx    Throat cancer Neg Hx    Kidney disease Neg Hx    Liver disease Neg Hx    Social History   Socioeconomic History   Marital status: Married    Spouse name: Not on file   Number of children: Not on file   Years of education: Not on file   Highest education level: Not on file  Occupational History   Not on file  Tobacco Use   Smoking status: Former    Types: Cigarettes    Quit date: 07/08/2009    Years since quitting: 12.3   Smokeless tobacco: Never  Vaping Use   Vaping Use: Never used  Substance and Sexual Activity   Alcohol use: Yes    Alcohol/week: 12.0 standard drinks    Types: 12 Cans of beer per week    Comment: 8 beers a week   Drug use: Not Currently   Sexual activity: Not on file  Other Topics Concern   Not on file  Social History Narrative   Patient is married, he is retired. 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   Social Determinants of Radio broadcast assistant Strain: Low Risk    Difficulty of Paying Living Expenses: Not hard at all  Food Insecurity: No Food Insecurity   Worried About Charity fundraiser in the Last Year: Never true   Arboriculturist in the Last Year: Never true  Transportation Needs: No  Transportation Needs   Lack of Transportation (Medical): No   Lack of Transportation (Non-Medical): No  Physical Activity: Sufficiently Active   Days of Exercise per Week: 5 days   Minutes of Exercise per Session: 30 min  Stress: No Stress Concern Present   Feeling of Stress : Not at all  Social Connections: Socially Integrated   Frequency of Communication with Friends and Family: More than three times a week   Frequency of Social Gatherings  with Friends and Family: More than three times a week   Attends Religious Services: 1 to 4 times per year   Active Member of Clubs or Organizations: No   Attends Music therapist: 1 to 4 times per year   Marital Status: Married    Tobacco Counseling Counseling given: Not Answered   Clinical Intake:  Pre-visit preparation completed: Yes  Pain : No/denies pain     BMI - recorded: 32.39 Nutritional Status: BMI > 30  Obese Nutritional Risks: None Diabetes: No  How often do you need to have someone help you when you read instructions, pamphlets, or other written materials from your doctor or pharmacy?: 1 - Never  Diabetic?No   Interpreter Needed?: No  Information entered by :: mj Synethia Endicott,lpn   Activities of Daily Living In your present state of health, do you have any difficulty performing the following activities: 11/27/2021  Hearing? N  Vision? N  Difficulty concentrating or making decisions? N  Walking or climbing stairs? N  Dressing or bathing? N  Doing errands, shopping? N  Preparing Food and eating ? N  Using the Toilet? N  In the past six months, have you accidently leaked urine? N  Do you have problems with loss of bowel control? N  Managing your Medications? N  Managing your Finances? N  Housekeeping or managing your Housekeeping? N  Some recent data might be hidden    Patient Care Team: Claretta Fraise, MD as PCP - General (Family Medicine) Harl Bowie Alphonse Guild, MD as PCP - Cardiology  (Cardiology)  Indicate any recent Medical Services you may have received from other than [facility] providers in the past year (date may be approximate).     Assessment:   This is a routine wellness examination for Connor Bryant.  Hearing/Vision screen Hearing Screening - Comments:: No hearing issues.  Vision Screening - Comments:: No glasses at this time due to cataract surgery. Dr. Wynetta Emery in [place] 2022.  Dietary issues and exercise activities discussed: Current Exercise Habits: Home exercise routine, Type of exercise: walking, Time (Minutes): 30, Frequency (Times/Week): 5, Weekly Exercise (Minutes/Week): 150, Intensity: Mild, Exercise limited by: cardiac condition(s)   Goals Addressed             This Visit's Progress    Exercise 3x per week (30 min per time)       Continue to exercise and stay healthy.       Depression Screen PHQ 2/9 Scores 11/27/2021 10/24/2021 07/24/2021 06/29/2021 06/20/2021 06/14/2021 03/02/2021  PHQ - 2 Score 0 0 0 0 0 0 1  PHQ- 9 Score - - - 0 0 0 3    Fall Risk Fall Risk  11/27/2021 10/24/2021 07/24/2021 06/29/2021 06/14/2021  Falls in the past year? 0 0 0 0 0  Number falls in past yr: 0 - - - -  Injury with Fall? 0 - - - -  Risk for fall due to : No Fall Risks - - - -  Follow up Falls prevention discussed - - - -    FALL RISK PREVENTION PERTAINING TO THE HOME:  Any stairs in or around the home? No  If so, are there any without handrails? No  Home free of loose throw rugs in walkways, pet beds, electrical cords, etc? Yes  Adequate lighting in your home to reduce risk of falls? No   ASSISTIVE DEVICES UTILIZED TO PREVENT FALLS:  Life alert? No  Use of a cane, walker or w/c? No  Grab bars in  the bathroom? Yes  Shower chair or bench in shower? No  Elevated toilet seat or a handicapped toilet? Yes   TIMED UP AND GO:  Was the test performed? No .  Phone visit.  Cognitive Function:     6CIT Screen 11/27/2021  What Year? 0 points  What month? 0 points   What time? 0 points  Count back from 20 0 points  Months in reverse 0 points  Repeat phrase 2 points  Total Score 2    Immunizations Immunization History  Administered Date(s) Administered   Fluad Quad(high Dose 65+) 08/27/2019, 08/30/2020, 06/14/2021   Influenza Split 07/06/2016, 06/24/2017, 08/26/2018   Influenza, High Dose Seasonal PF 07/06/2016, 06/24/2017, 08/26/2018, 08/27/2019    TDAP status: Due, Education has been provided regarding the importance of this vaccine. Advised may receive this vaccine at local pharmacy or Health Dept. Aware to provide a copy of the vaccination record if obtained from local pharmacy or Health Dept. Verbalized acceptance and understanding.  Flu Vaccine status: Up to date  Pneumococcal vaccine status: Due, Education has been provided regarding the importance of this vaccine. Advised may receive this vaccine at local pharmacy or Health Dept. Aware to provide a copy of the vaccination record if obtained from local pharmacy or Health Dept. Verbalized acceptance and understanding.  Covid-19 vaccine status: Declined, Education has been provided regarding the importance of this vaccine but patient still declined. Advised may receive this vaccine at local pharmacy or Health Dept.or vaccine clinic. Aware to provide a copy of the vaccination record if obtained from local pharmacy or Health Dept. Verbalized acceptance and understanding.  Qualifies for Shingles Vaccine? Yes   Zostavax completed No   Shingrix Completed?: No.    Education has been provided regarding the importance of this vaccine. Patient has been advised to call insurance company to determine out of pocket expense if they have not yet received this vaccine. Advised may also receive vaccine at local pharmacy or Health Dept. Verbalized acceptance and understanding.  Screening Tests Health Maintenance  Topic Date Due   COVID-19 Vaccine (1) Never done   COLONOSCOPY (Pts 45-62yr Insurance coverage  will need to be confirmed)  11/17/2018   OPHTHALMOLOGY EXAM  01/27/2021   Zoster Vaccines- Shingrix (1 of 2) 01/21/2022 (Originally 12/12/1994)   Pneumonia Vaccine 77 Years old (1 - PCV) 10/24/2022 (Originally 12/11/2009)   TETANUS/TDAP  10/24/2022 (Originally 12/12/1964)   FOOT EXAM  11/28/2021   HEMOGLOBIN A1C  04/23/2022   INFLUENZA VACCINE  Completed   Hepatitis C Screening  Completed   HPV VACCINES  Aged Out    Health Maintenance  Health Maintenance Due  Topic Date Due   COVID-19 Vaccine (1) Never done   COLONOSCOPY (Pts 45-439yrInsurance coverage will need to be confirmed)  11/17/2018   OPHTHALMOLOGY EXAM  01/27/2021    Colorectal cancer screening: Type of screening: Colonoscopy. Completed 11/17/2013. Repeat every 5 years  Lung Cancer Screening: (Low Dose CT Chest recommended if Age 77-80ears, 30 pack-year currently smoking OR have quit w/in 15years.) does not qualify.   Additional Screening:  Hepatitis C Screening: does qualify; Completed 11/28/2020  Vision Screening: Recommended annual ophthalmology exams for early detection of glaucoma and other disorders of the eye. Is the patient up to date with their annual eye exam?  Yes  Who is the provider or what is the name of the office in which the patient attends annual eye exams? [facility] If pt is not established with a provider, would they  like to be referred to a provider to establish care? No .   Dental Screening: Recommended annual dental exams for proper oral hygiene  Community Resource Referral / Chronic Care Management: CRR required this visit?  No   CCM required this visit?  No      Plan:     I have personally reviewed and noted the following in the patients chart:   Medical and social history Use of alcohol, tobacco or illicit drugs  Current medications and supplements including opioid prescriptions. Patient is not currently taking opioid prescriptions. Functional ability and status Nutritional  status Physical activity Advanced directives List of other physicians Hospitalizations, surgeries, and ER visits in previous 12 months Vitals Screenings to include cognitive, depression, and falls Referrals and appointments  In addition, I have reviewed and discussed with patient certain preventive protocols, quality metrics, and best practice recommendations. A written personalized care plan for preventive services as well as general preventive health recommendations were provided to patient.     Chriss Driver, LPN   03/27/2594   Nurse Notes: Discussed repeat colonoscopy. Pt declined. Discussed Shingrix and how to obtain. 6CIT score of 0.

## 2021-11-27 NOTE — Patient Instructions (Signed)
Connor Bryant , Thank you for taking time to come for your Medicare Wellness Visit. I appreciate your ongoing commitment to your health goals. Please review the following plan we discussed and let me know if I can assist you in the future.   Screening recommendations/referrals: Colonoscopy: Done 11/17/2013 Repeat in 5 years  Recommended yearly ophthalmology/optometry visit for glaucoma screening and checkup Recommended yearly dental visit for hygiene and checkup  Vaccinations: Influenza vaccine: Done 06/14/2021 Repeat annually  Pneumococcal vaccine: Done at [facility], please bring copy of record to document in [facility] chart. Tdap vaccine: Done at [facility], please bring copy of record to document in [facility] chart. Shingles vaccine: Discussed.   Covid-19: Declined.  Advanced directives: Advance directive discussed with you today. Even though you declined this today, please call our office should you change your mind, and we can give you the proper paperwork for you to fill out.   Conditions/risks identified: KEEP UP THE GOOD WORK!!  Next appointment: Follow up in one year for your annual wellness visit. 2024.  Preventive Care 77 Years and Older, Male  Preventive care refers to lifestyle choices and visits with your health care provider that can promote health and wellness. What does preventive care include? A yearly physical exam. This is also called an annual well check. Dental exams once or twice a year. Routine eye exams. Ask your health care provider how often you should have your eyes checked. Personal lifestyle choices, including: Daily care of your teeth and gums. Regular physical activity. Eating a healthy diet. Avoiding tobacco and drug use. Limiting alcohol use. Practicing safe sex. Taking low doses of aspirin every day. Taking vitamin and mineral supplements as recommended by your health care provider. What happens during an annual well check? The services and screenings done by your health  care provider during your annual well check will depend on your age, overall health, lifestyle risk factors, and family history of disease. Counseling  Your health care provider may ask you questions about your: Alcohol use. Tobacco use. Drug use. Emotional well-being. Home and relationship well-being. Sexual activity. Eating habits. History of falls. Memory and ability to understand (cognition). Work and work Statistician. Screening  You may have the following tests or measurements: Height, weight, and BMI. Blood pressure. Lipid and cholesterol levels. These may be checked every 5 years, or more frequently if you are over 51 years old. Skin check. Lung cancer screening. You may have this screening every year starting at age 94 if you have a 30-pack-year history of smoking and currently smoke or have quit within the past 15 years. Fecal occult blood test (FOBT) of the stool. You may have this test every year starting at age 77. Flexible sigmoidoscopy or colonoscopy. You may have a sigmoidoscopy every 5 years or a colonoscopy every 10 years starting at age 77. Prostate cancer screening. Recommendations will vary depending on your family history and other risks. Hepatitis C blood test. Hepatitis B blood test. Sexually transmitted disease (STD) testing. Diabetes screening. This is done by checking your blood sugar (glucose) after you have not eaten for a while (fasting). You may have this done every 1-3 years. Abdominal aortic aneurysm (AAA) screening. You may need this if you are a current or former smoker. Osteoporosis. You may be screened starting at age 77 if you are at high risk. Talk with your health care provider about your test results, treatment options, and if necessary, the need for more tests. Vaccines  Your health care provider may  recommend certain vaccines, such as: Influenza vaccine. This is recommended every year. Tetanus, diphtheria, and acellular pertussis (Tdap, Td)  vaccine. You may need a Td booster every 10 years. Zoster vaccine. You may need this after age 30. Pneumococcal 13-valent conjugate (PCV13) vaccine. One dose is recommended after age 77. Pneumococcal polysaccharide (PPSV23) vaccine. One dose is recommended after age 77. Talk to your health care provider about which screenings and vaccines you need and how often you need them. This information is not intended to replace advice given to you by your health care provider. Make sure you discuss any questions you have with your health care provider. Document Released: 10/07/2015 Document Revised: 05/30/2016 Document Reviewed: 07/12/2015 Elsevier Interactive Patient Education  2017 [place] Prevention in the Home Falls can cause injuries. They can happen to people of all ages. There are many things you can do to make your home safe and to help prevent falls. What can I do on the outside of my home? Regularly fix the edges of walkways and driveways and fix any cracks. Remove anything that might make you trip as you walk through a door, such as a raised step or threshold. Trim any bushes or trees on the path to your home. Use bright outdoor lighting. Clear any walking paths of anything that might make someone trip, such as rocks or tools. Regularly check to see if handrails are loose or broken. Make sure that both sides of any steps have handrails. Any raised decks and porches should have guardrails on the edges. Have any leaves, snow, or ice cleared regularly. Use sand or salt on walking paths during winter. Clean up any spills in your garage right away. This includes oil or grease spills. What can I do in the bathroom? Use night lights. Install grab bars by the toilet and in the tub and shower. Do not use towel bars as grab bars. Use non-skid mats or decals in the tub or shower. If you need to sit down in the shower, use a plastic, non-slip stool. Keep the floor dry. Clean up any  water that spills on the floor as soon as it happens. Remove soap buildup in the tub or shower regularly. Attach bath mats securely with double-sided non-slip rug tape. Do not have throw rugs and other things on the floor that can make you trip. What can I do in the bedroom? Use night lights. Make sure that you have a light by your bed that is easy to reach. Do not use any sheets or blankets that are too big for your bed. They should not hang down onto the floor. Have a firm chair that has side arms. You can use this for support while you get dressed. Do not have throw rugs and other things on the floor that can make you trip. What can I do in the kitchen? Clean up any spills right away. Avoid walking on wet floors. Keep items that you use a lot in easy-to-reach places. If you need to reach something above you, use a strong step stool that has a grab bar. Keep electrical cords out of the way. Do not use floor polish or wax that makes floors slippery. If you must use wax, use non-skid floor wax. Do not have throw rugs and other things on the floor that can make you trip. What can I do with my stairs? Do not leave any items on the stairs. Make sure that there are handrails on both sides of  the stairs and use them. Fix handrails that are broken or loose. Make sure that handrails are as long as the stairways. Check any carpeting to make sure that it is firmly attached to the stairs. Fix any carpet that is loose or worn. Avoid having throw rugs at the top or bottom of the stairs. If you do have throw rugs, attach them to the floor with carpet tape. Make sure that you have a light switch at the top of the stairs and the bottom of the stairs. If you do not have them, ask someone to add them for you. What else can I do to help prevent falls? Wear shoes that: Do not have high heels. Have rubber bottoms. Are comfortable and fit you well. Are closed at the toe. Do not wear sandals. If you use a  stepladder: Make sure that it is fully opened. Do not climb a closed stepladder. Make sure that both sides of the stepladder are locked into place. Ask someone to hold it for you, if possible. Clearly mark and make sure that you can see: Any grab bars or handrails. First and last steps. Where the edge of each step is. Use tools that help you move around (mobility aids) if they are needed. These include: Canes. Walkers. Scooters. Crutches. Turn on the lights when you go into a dark area. Replace any light bulbs as soon as they burn out. Set up your furniture so you have a clear path. Avoid moving your furniture around. If any of your floors are uneven, fix them. If there are any pets around you, be aware of where they are. Review your medicines with your doctor. Some medicines can make you feel dizzy. This can increase your chance of falling. Ask your doctor what other things that you can do to help prevent falls. This information is not intended to replace advice given to you by your health care provider. Make sure you discuss any questions you have with your health care provider. Document Released: 07/07/2009 Document Revised: 02/16/2016 Document Reviewed: 10/15/2014 Elsevier Interactive Patient Education  2017 [employer].

## 2021-11-29 ENCOUNTER — Other Ambulatory Visit: Payer: Self-pay | Admitting: Family Medicine

## 2021-12-05 ENCOUNTER — Other Ambulatory Visit: Payer: Self-pay

## 2021-12-05 MED ORDER — ISOSORBIDE MONONITRATE ER 30 MG PO TB24: 30.0000 mg | ORAL_TABLET | Freq: Every day | ORAL | 3 refills | Status: DC

## 2021-12-06 DIAGNOSIS — N183 Chronic kidney disease, stage 3 unspecified: Secondary | ICD-10-CM | POA: Diagnosis not present

## 2021-12-11 DIAGNOSIS — I739 Peripheral vascular disease, unspecified: Secondary | ICD-10-CM | POA: Diagnosis not present

## 2021-12-11 DIAGNOSIS — I129 Hypertensive chronic kidney disease with stage 1 through stage 4 chronic kidney disease, or unspecified chronic kidney disease: Secondary | ICD-10-CM | POA: Diagnosis not present

## 2021-12-11 DIAGNOSIS — I2510 Atherosclerotic heart disease of native coronary artery without angina pectoris: Secondary | ICD-10-CM | POA: Diagnosis not present

## 2021-12-11 DIAGNOSIS — N183 Chronic kidney disease, stage 3 unspecified: Secondary | ICD-10-CM | POA: Diagnosis not present

## 2021-12-11 DIAGNOSIS — I5020 Unspecified systolic (congestive) heart failure: Secondary | ICD-10-CM | POA: Diagnosis not present

## 2021-12-11 DIAGNOSIS — E872 Acidosis, unspecified: Secondary | ICD-10-CM | POA: Diagnosis not present

## 2021-12-11 DIAGNOSIS — I639 Cerebral infarction, unspecified: Secondary | ICD-10-CM | POA: Diagnosis not present

## 2022-01-17 ENCOUNTER — Ambulatory Visit: Payer: Medicare Other | Admitting: Family Medicine

## 2022-01-17 ENCOUNTER — Other Ambulatory Visit: Payer: Self-pay | Admitting: Family Medicine

## 2022-01-17 DIAGNOSIS — M1A9XX0 Chronic gout, unspecified, without tophus (tophi): Secondary | ICD-10-CM

## 2022-01-22 ENCOUNTER — Encounter: Payer: Self-pay | Admitting: Family Medicine

## 2022-01-22 ENCOUNTER — Ambulatory Visit (INDEPENDENT_AMBULATORY_CARE_PROVIDER_SITE_OTHER): Payer: Medicare Other | Admitting: Family Medicine

## 2022-01-22 VITALS — BP 160/56 | HR 66 | Temp 97.4°F | Ht 68.0 in | Wt 207.6 lb

## 2022-01-22 DIAGNOSIS — I10 Essential (primary) hypertension: Secondary | ICD-10-CM | POA: Diagnosis not present

## 2022-01-22 DIAGNOSIS — E1122 Type 2 diabetes mellitus with diabetic chronic kidney disease: Secondary | ICD-10-CM | POA: Diagnosis not present

## 2022-01-22 DIAGNOSIS — E1169 Type 2 diabetes mellitus with other specified complication: Secondary | ICD-10-CM

## 2022-01-22 DIAGNOSIS — E756 Lipid storage disorder, unspecified: Secondary | ICD-10-CM | POA: Diagnosis not present

## 2022-01-22 DIAGNOSIS — E7800 Pure hypercholesterolemia, unspecified: Secondary | ICD-10-CM

## 2022-01-22 LAB — BAYER DCA HB A1C WAIVED: HB A1C (BAYER DCA - WAIVED): 6.3 % — ABNORMAL HIGH (ref 4.8–5.6)

## 2022-01-22 MED ORDER — ATORVASTATIN CALCIUM 80 MG PO TABS: 80.0000 mg | ORAL_TABLET | Freq: Every day | ORAL | 2 refills | Status: DC

## 2022-01-22 NOTE — Progress Notes (Signed)
? ?Subjective:  ?Patient ID: Connor Bryant,  ?male    DOB: 11/18/1945  Age: 76 y.o.  ? ? ?CC: Medical Management of Chronic Issues ? ? ?HPI ?Connor Bryant presents for  follow-up of hypertension. Patient has no history of headache chest pain or shortness of breath or recent cough. Patient also denies symptoms of TIA such as numbness weakness lateralizing. Patient denies side effects from medication. States taking it regularly. Home BP runs 914-782 systolic at home.  ? ?Patient also  in for follow-up of elevated cholesterol. Doing well without complaints on current medication. Denies side effects  including myalgia and arthralgia and nausea. Also in today for liver function testing. Currently no chest pain, shortness of breath or other cardiovascular related symptoms noted. ? ?Follow-up of diabetes. Patient refuses to check blood sugar at home. Patient denies symptoms such as excessive hunger or urinary frequency, excessive hunger, nausea ?No significant hypoglycemic spells noted. ?Medications reviewed. Pt reports taking them regularly. Pt. denies complication/adverse reaction today.  ? ? ?History ?Connor Bryant has a past medical history of Acute CVA (cerebrovascular accident) ([place]) (05/01/2020), Acute respiratory failure with hypoxia ([place]) (05/04/2020), Arthritis, DM type 2 (diabetes mellitus, type 2) ([place]), Endotracheally intubated, Gallstone, Heart murmur, HTN (hypertension), Hyperlipidemia, Personal history of colonic polyp-adenoma (11/24/2013), Renal insufficiency, and Thoracic spine fracture ([place]).  ? ?He has a past surgical history that includes Debridement tennis elbow (Right); Tonsillectomy and adenoidectomy; Colonoscopy; Esophagogastroduodenoscopy; Mesenteric artery bypass (N/A, 03/10/2014); abdominal aortagram (N/A, 02/23/2014); vascualr surgery; and Cataract extraction, bilateral.  ? ?His family history includes AAA (abdominal aortic aneurysm) in his mother; Bladder Cancer in his father; Cancer in his father;  Coronary artery disease in his father; Diabetes in his father and mother; Heart attack in his father; Heart disease in his father and mother; Hyperlipidemia in his father and mother; Hypertension in his father and mother.He reports that he quit smoking about 12 years ago. His smoking use included cigarettes. He has never used smokeless tobacco. He reports current alcohol use of about 12.0 standard drinks per week. He reports that he does not currently use drugs. ? ?Current Outpatient Medications on File Prior to Visit  ?Medication Sig Dispense Refill  ? allopurinol (ZYLOPRIM) 100 MG tablet TAKE 1 TABLET 2 TO 3 TIMES A DAY FOR GOUT 90 tablet 0  ? amLODipine (NORVASC) 5 MG tablet Take 1 tablet (5 mg total) by mouth daily. For blood pressure 90 tablet 3  ? apixaban (ELIQUIS) 5 MG TABS tablet Take 1 tablet (5 mg total) by mouth 2 (two) times daily. 180 tablet 2  ? carvedilol (COREG) 25 MG tablet Take 25 mg by mouth daily.    ? chlorthalidone (HYGROTON) 25 MG tablet Take 12.5 mg by mouth every morning.    ? Coenzyme Q10 (COQ10) 200 MG CAPS Take by mouth daily.    ? fluticasone (FLONASE) 50 MCG/ACT nasal spray Place 2 sprays into both nostrils daily. 16 g 6  ? glimepiride (AMARYL) 2 MG tablet TAKE (1) TABLET DAILY IN THE MORNING. 90 tablet 3  ? hydrALAZINE (APRESOLINE) 100 MG tablet Take 1 tablet (100 mg total) by mouth 3 (three) times daily. 270 tablet 3  ? isosorbide mononitrate (IMDUR) 30 MG 24 hr tablet Take 1 tablet (30 mg total) by mouth daily. 90 tablet 3  ? levocetirizine (XYZAL) 5 MG tablet Take 1 tablet every evening. 90 tablet 1  ? lisinopril (ZESTRIL) 20 MG tablet Take 1 tablet (20 mg total) by mouth daily. 30 tablet 11  ?  polyethylene glycol (MIRALAX / GLYCOLAX) packet Take 17 g by mouth daily. (Patient taking differently: Take 8.5 g by mouth daily as needed.) 30 packet 1  ? prednisoLONE acetate (PRED FORTE) 1 % ophthalmic suspension Place 1 drop into the right eye 4 (four) times daily.    ? ?No current  facility-administered medications on file prior to visit.  ? ? ?ROS ?Review of Systems  ?Constitutional:  Negative for fever.  ?Respiratory:  Negative for shortness of breath.   ?Cardiovascular:  Negative for chest pain.  ?Musculoskeletal:  Negative for arthralgias.  ?Skin:  Negative for rash.  ? ?Objective:  ?BP (!) 160/56   Pulse 66   Temp (!) 97.4 ?F (36.3 ?C)   Ht 5' 8"  (1.727 m)   Wt 207 lb 9.6 oz (94.2 kg)   SpO2 97%   BMI 31.57 kg/m?  ? ?BP Readings from Last 3 Encounters:  ?01/22/22 (!) 160/56  ?10/24/21 (!) 185/64  ?09/21/21 (!) 162/78  ? ? ?Wt Readings from Last 3 Encounters:  ?01/22/22 207 lb 9.6 oz (94.2 kg)  ?11/27/21 213 lb (96.6 kg)  ?10/24/21 213 lb 3.2 oz (96.7 kg)  ? ? ? ?Physical Exam ?Vitals reviewed.  ?Constitutional:   ?   Appearance: He is well-developed.  ?HENT:  ?   Head: Normocephalic and atraumatic.  ?   Right Ear: External ear normal.  ?   Left Ear: External ear normal.  ?   Mouth/Throat:  ?   Pharynx: No oropharyngeal exudate or posterior oropharyngeal erythema.  ?Eyes:  ?   Pupils: Pupils are equal, round, and reactive to light.  ?Cardiovascular:  ?   Rate and Rhythm: Normal rate and regular rhythm.  ?   Heart sounds: No murmur heard. ?Pulmonary:  ?   Effort: No respiratory distress.  ?   Breath sounds: Normal breath sounds.  ?Musculoskeletal:  ?   Cervical back: Normal range of motion and neck supple.  ?Neurological:  ?   Mental Status: He is alert and oriented to person, place, and time.  ? ? ?Diabetic Foot Exam - Simple   ?No data filed ?  ? ? ? ? ?Assessment & Plan:  ? ?Connor Bryant was seen today for medical management of chronic issues. ? ?Diagnoses and all orders for this visit: ? ?Type 2 diabetes mellitus with chronic kidney disease, without long-term current use of insulin, unspecified CKD stage ([place]) ?-     Bayer DCA Hb A1c Waived ?-     atorvastatin (LIPITOR) 80 MG tablet; Take 1 tablet (80 mg total) by mouth daily. ? ?Essential hypertension ?-     CBC with  Differential/Platelet ?-     CMP14+EGFR ? ?Pure hypercholesterolemia ?-     Lipid panel ? ?Diabetic lipidosis (HCC) ?-     atorvastatin (LIPITOR) 80 MG tablet; Take 1 tablet (80 mg total) by mouth daily. ? ? ?I have changed Connor Bryant "Connor Bryant"'s atorvastatin. I am also having him maintain his polyethylene glycol, CoQ10, fluticasone, apixaban, glimepiride, lisinopril, hydrALAZINE, amLODipine, levocetirizine, isosorbide mononitrate, allopurinol, chlorthalidone, prednisoLONE acetate, and carvedilol. ? ?Meds ordered this encounter  ?Medications  ? atorvastatin (LIPITOR) 80 MG tablet  ?  Sig: Take 1 tablet (80 mg total) by mouth daily.  ?  Dispense:  90 tablet  ?  Refill:  2  ? ? ? ?Follow-up: Return in about 3 months (around 04/24/2022). ? ?Claretta Fraise, M.D. ? ?

## 2022-01-23 LAB — CBC WITH DIFFERENTIAL/PLATELET
Basophils Absolute: 0 10*3/uL (ref 0.0–0.2)
Basos: 0 %
EOS (ABSOLUTE): 0.4 10*3/uL (ref 0.0–0.4)
Eos: 5 %
Hematocrit: 37.8 % (ref 37.5–51.0)
Hemoglobin: 12.7 g/dL — ABNORMAL LOW (ref 13.0–17.7)
Immature Grans (Abs): 0 10*3/uL (ref 0.0–0.1)
Immature Granulocytes: 0 %
Lymphocytes Absolute: 1.9 10*3/uL (ref 0.7–3.1)
Lymphs: 25 %
MCH: 30.7 pg (ref 26.6–33.0)
MCHC: 33.6 g/dL (ref 31.5–35.7)
MCV: 91 fL (ref 79–97)
Monocytes Absolute: 0.7 10*3/uL (ref 0.1–0.9)
Monocytes: 10 %
Neutrophils Absolute: 4.4 10*3/uL (ref 1.4–7.0)
Neutrophils: 60 %
Platelets: 220 10*3/uL (ref 150–450)
RBC: 4.14 x10E6/uL (ref 4.14–5.80)
RDW: 14.6 % (ref 11.6–15.4)
WBC: 7.4 10*3/uL (ref 3.4–10.8)

## 2022-01-23 LAB — CMP14+EGFR
ALT: 22 IU/L (ref 0–44)
AST: 18 IU/L (ref 0–40)
Albumin/Globulin Ratio: 1.8 (ref 1.2–2.2)
Albumin: 4.5 g/dL (ref 3.7–4.7)
Alkaline Phosphatase: 101 IU/L (ref 44–121)
BUN/Creatinine Ratio: 26 — ABNORMAL HIGH (ref 10–24)
BUN: 45 mg/dL — ABNORMAL HIGH (ref 8–27)
Bilirubin Total: 0.3 mg/dL (ref 0.0–1.2)
CO2: 21 mmol/L (ref 20–29)
Calcium: 9.6 mg/dL (ref 8.6–10.2)
Chloride: 107 mmol/L — ABNORMAL HIGH (ref 96–106)
Creatinine, Ser: 1.73 mg/dL — ABNORMAL HIGH (ref 0.76–1.27)
Globulin, Total: 2.5 g/dL (ref 1.5–4.5)
Glucose: 97 mg/dL (ref 70–99)
Potassium: 5.1 mmol/L (ref 3.5–5.2)
Sodium: 142 mmol/L (ref 134–144)
Total Protein: 7 g/dL (ref 6.0–8.5)
eGFR: 40 mL/min/{1.73_m2} — ABNORMAL LOW (ref >59)

## 2022-01-23 LAB — LIPID PANEL
Chol/HDL Ratio: 2.4 ratio (ref 0.0–5.0)
Cholesterol, Total: 151 mg/dL (ref 100–199)
HDL: 62 mg/dL (ref >39)
LDL Chol Calc (NIH): 67 mg/dL (ref 0–99)
Triglycerides: 129 mg/dL (ref 0–149)
VLDL Cholesterol Cal: 22 mg/dL (ref 5–40)

## 2022-01-23 NOTE — Progress Notes (Signed)
Hello Burl,  Your lab result is normal and/or stable.Some minor variations that are not significant are commonly marked abnormal, but do not represent any medical problem for you.  Best regards, Koden Hunzeker, M.D.

## 2022-03-06 ENCOUNTER — Other Ambulatory Visit: Payer: Self-pay | Admitting: Family Medicine

## 2022-03-06 DIAGNOSIS — M1A9XX0 Chronic gout, unspecified, without tophus (tophi): Secondary | ICD-10-CM

## 2022-04-02 ENCOUNTER — Ambulatory Visit: Payer: Medicare Other | Admitting: Cardiology

## 2022-04-02 ENCOUNTER — Encounter: Payer: Self-pay | Admitting: Cardiology

## 2022-04-02 VITALS — BP 176/70 | HR 68 | Ht 68.0 in | Wt 207.2 lb

## 2022-04-02 DIAGNOSIS — I6523 Occlusion and stenosis of bilateral carotid arteries: Secondary | ICD-10-CM | POA: Diagnosis not present

## 2022-04-02 DIAGNOSIS — I10 Essential (primary) hypertension: Secondary | ICD-10-CM

## 2022-04-02 DIAGNOSIS — I4891 Unspecified atrial fibrillation: Secondary | ICD-10-CM

## 2022-04-02 DIAGNOSIS — I350 Nonrheumatic aortic (valve) stenosis: Secondary | ICD-10-CM | POA: Diagnosis not present

## 2022-04-02 DIAGNOSIS — Z8679 Personal history of other diseases of the circulatory system: Secondary | ICD-10-CM

## 2022-04-02 MED ORDER — CARVEDILOL 25 MG PO TABS: 25.0000 mg | ORAL_TABLET | Freq: Two times a day (BID) | ORAL | 1 refills | Status: DC

## 2022-04-02 NOTE — Patient Instructions (Signed)
Medication Instructions:  Increase Coreg to '25mg'$  twice a day   Continue all other medications.     Labwork: none  Testing/Procedures: Your physician has requested that you have an echocardiogram. Echocardiography is a painless test that uses sound waves to create images of your heart. It provides your doctor with information about the size and shape of your heart and how well your heart's chambers and valves are working. This procedure takes approximately one hour. There are no restrictions for this procedure. Your physician has requested that you have a carotid duplex. This test is an ultrasound of the carotid arteries in your neck. It looks at blood flow through these arteries that supply the brain with blood. Allow one hour for this exam. There are no restrictions or special instructions.  Office will contact with results via phone or letter.     Follow-Up: 6 months   Any Other Special Instructions Will Be Listed Below (If Applicable).   If you need a refill on your cardiac medications before your next appointment, please call your pharmacy.

## 2022-04-02 NOTE — Progress Notes (Signed)
Clinical Summary Mr. Slatten is a 76 y.o.male seen today for follow up of the following medical problems.      1. NSTEMI/Stress induced CM - NSTEMI druing admission where he presented with CVA initially - peak trop 2500  - 04/2020 echo LVEF 35% (acute change from 05/02/20 echo) with apical akinesis. - possible acute plaque rupture vs coronary embolic event given presented with new onset afib and stroke vs. Stress induced CM/Takotsubo CM given echo findings - admission complicated by PE, hematuria, anemia, AKI given recent stroke and respiratory failure on ventilator did not pursue emergent cath   06/2020 echo LVEF 60-65%, no WMAs   - no chest pain, no SOB/DOE.      2. Chronic sysotlic HF - new diagnosis during admission with CVA - - 04/2020 echo LVEF 35% (acute change from 05/02/20 echo) with apical akinesis.  - cath not pursued at that time due to CVA. Admission also complicated with PE, hematuria, anemia, AKI     06/2020 echo LVEF 60-65%, no WMAs - clincal scenario would suggest this may have been a stress induced CM in setting of his CVA     - no SOB/DOE. No recent edema       2. PAF - new diagnosis during admission with CVA - 07/2020 monitor without recurrent afib     - no recent palpitations, though reports can have significant spells for days at a time then quite periods for weeks to months at a time.  - no bleeding on eliquis.    3. CVA - per pcp     4. PE - has been on eliquis   5. Anemia     6. PAD  s/p prior mesenteric artery bypass and recent bilateral iliac stenting at [facility] on 04/27/2020 - now off plavix, had noted some excessive gum bleeding. Just on eliquis - continues to f/u with vascualr.    7. Aortic stenosis - mild to moderate by 06/2020 echo, AVA VTI 1.18 and mean grad 16.  - no recent symptoms   8. HTN -significant white coat HTN history  Home 120-130s/60s, checks daily.    9. Carotid stenosis - noted on MRA - due for repeat  imaging [place]   10. CKD - followed by Dr Joelyn Oms  11.Hyperlipidemia - 01/2022 TC 151 TG 129 HDL 62 LDL 67 - he is on atorvastatin Past Medical History:  Diagnosis Date   Acute CVA (cerebrovascular accident) ([place]) 05/01/2020   Acute respiratory failure with hypoxia ([place]) 05/04/2020   Arthritis    DM type 2 (diabetes mellitus, type 2) ([place])    no meds   Endotracheally intubated    Gallstone    Heart murmur    was told one time he had a murmur, not since 30 years ago   HTN (hypertension)    Hyperlipidemia    Personal history of colonic polyp-adenoma 11/24/2013   Renal insufficiency    mild   Thoracic spine fracture (HCC)      No Known Allergies   Current Outpatient Medications  Medication Sig Dispense Refill   allopurinol (ZYLOPRIM) 100 MG tablet TAKE 1 TABLET 2 TO 3 TIMES A DAY FOR GOUT 90 tablet 0   amLODipine (NORVASC) 5 MG tablet Take 1 tablet (5 mg total) by mouth daily. For blood pressure 90 tablet 3   apixaban (ELIQUIS) 5 MG TABS tablet Take 1 tablet (5 mg total) by mouth 2 (two) times daily. 180 tablet 2   atorvastatin (LIPITOR) 80  MG tablet Take 1 tablet (80 mg total) by mouth daily. 90 tablet 2   carvedilol (COREG) 25 MG tablet Take 25 mg by mouth daily.     chlorthalidone (HYGROTON) 25 MG tablet Take 12.5 mg by mouth every morning.     Coenzyme Q10 (COQ10) 200 MG CAPS Take by mouth daily.     fluticasone (FLONASE) 50 MCG/ACT nasal spray Place 2 sprays into both nostrils daily. 16 g 6   glimepiride (AMARYL) 2 MG tablet TAKE (1) TABLET DAILY IN THE MORNING. 90 tablet 3   hydrALAZINE (APRESOLINE) 100 MG tablet Take 1 tablet (100 mg total) by mouth 3 (three) times daily. 270 tablet 3   isosorbide mononitrate (IMDUR) 30 MG 24 hr tablet Take 1 tablet (30 mg total) by mouth daily. 90 tablet 3   levocetirizine (XYZAL) 5 MG tablet Take 1 tablet every evening. 90 tablet 1   lisinopril (ZESTRIL) 20 MG tablet Take 1 tablet (20 mg total) by mouth daily. 30 tablet 11   polyethylene  glycol (MIRALAX / GLYCOLAX) packet Take 17 g by mouth daily. (Patient taking differently: Take 8.5 g by mouth daily as needed.) 30 packet 1   prednisoLONE acetate (PRED FORTE) 1 % ophthalmic suspension Place 1 drop into the right eye 4 (four) times daily.     No current facility-administered medications for this visit.     Past Surgical History:  Procedure Laterality Date   ABDOMINAL AORTAGRAM N/A 02/23/2014   Procedure: ABDOMINAL AORTAGRAM;  Surgeon: Conrad Everly, MD;  Location: [facility] CATH LAB;  Service: Cardiovascular;  Laterality: N/A;   CATARACT EXTRACTION, BILATERAL     COLONOSCOPY     DEBRIDEMENT TENNIS ELBOW Right    ESOPHAGOGASTRODUODENOSCOPY     MESENTERIC ARTERY BYPASS N/A 03/10/2014   Procedure: AORTO TO SUPERIOR MESENTERIC ARTERY BYPASS;  Surgeon: [name], MD;  Location[facility] OR;  Service: Vascular;  Laterality: N/A;   TONSILLECTOMY AND ADENOIDECTOMY     vascualr surgery     illiac stents     No Known Allergies    Family History  Problem Relation Age of Onset   Hypertension Father    Coronary artery disease Father    Hyperlipidemia Father    Diabetes Father    Bladder Cancer Father    Cancer Father    Heart disease Father    Heart attack Father    Diabetes Mother    Heart disease Mother        before age 45   Hyperlipidemia Mother    Hypertension Mother    AAA (abdominal aortic aneurysm) Mother    Colon cancer Neg Hx    Throat cancer Neg Hx    Kidney disease Neg Hx    Liver disease Neg Hx      Social History Mr. Obryant reports that he quit smoking about 12 years ago. His smoking use included cigarettes. He has never used smokeless tobacco. Mr. Burleson reports current alcohol use of about 12.0 standard drinks of alcohol per week.   Review of Systems CONSTITUTIONAL: No weight loss, fever, chills, weakness or fatigue.  HEENT: Eyes: No visual loss, blurred vision, double vision or yellow sclerae.No hearing loss, sneezing, congestion, runny nose or sore  throat.  SKIN: No rash or itching.  CARDIOVASCULAR: per hpi RESPIRATORY: No shortness of breath, cough or sputum.  GASTROINTESTINAL: No anorexia, nausea, vomiting or diarrhea. No abdominal pain or blood.  GENITOURINARY: No burning on urination, no polyuria NEUROLOGICAL: No headache, dizziness, syncope, paralysis, ataxia,  numbness or tingling in the extremities. No change in bowel or bladder control.  MUSCULOSKELETAL: No muscle, back pain, joint pain or stiffness.  LYMPHATICS: No enlarged nodes. No history of splenectomy.  PSYCHIATRIC: No history of depression or anxiety.  ENDOCRINOLOGIC: No reports of sweating, cold or heat intolerance. No polyuria or polydipsia.  Marland Kitchen   Physical Examination Today's Vitals   04/02/22 1331  BP: (!) 176/70  Pulse: 68  SpO2: 96%  Weight: 207 lb 3.2 oz (94 kg)  Height: '5\' 8"'$  (1.727 m)   Body mass index is 31.5 kg/m.  Gen: resting comfortably, no acute distress HEENT: no scleral icterus, pupils equal round and reactive, no palptable cervical adenopathy,  CV: RRR. 3/6 systolic murmur rusb. Bilateral carotid bruits Resp: Clear to auscultation bilaterally GI: abdomen is soft, non-tender, non-distended, normal bowel sounds, no hepatosplenomegaly MSK: extremities are warm, no edema.  Skin: warm, no rash Neuro:  no focal deficits Psych: appropriate affect   Diagnostic Studies     Assessment and Plan   1. History of cardiomyopathy - LVEF has normalize, likely stress induced CM -no symptoms, continue to monitor.    2.  HTN - white coat HTN history, home bp's are at goal.  - home bp's at goal, continue current meds   3 Afib/Acquired thrombophilia - infrequent palpitations but when they occur can be pretty severe. Increase coreg to '25mg'$  bid, continue eliquis for stroke prevention       4. Aortic stenosis - mild to moderate by last echo, - we will repeat US  5. Carotid stenosis - repeat carotid US            Arnoldo Lenis,  M.D.

## 2022-04-05 ENCOUNTER — Ambulatory Visit (INDEPENDENT_AMBULATORY_CARE_PROVIDER_SITE_OTHER): Payer: Medicare Other

## 2022-04-05 DIAGNOSIS — I350 Nonrheumatic aortic (valve) stenosis: Secondary | ICD-10-CM

## 2022-04-05 DIAGNOSIS — I6523 Occlusion and stenosis of bilateral carotid arteries: Secondary | ICD-10-CM

## 2022-04-06 LAB — ECHOCARDIOGRAM COMPLETE
AR max vel: 1.25 cm2
AV Area VTI: 1.23 cm2
AV Area mean vel: 1.23 cm2
AV Mean grad: 15.4 mmHg
AV Peak grad: 27.7 mmHg
Ao pk vel: 2.63 m/s
Area-P 1/2: 1.98 cm2
Calc EF: 73.1 %
MV M vel: 5.04 m/s
MV Peak grad: 101.7 mmHg
S' Lateral: 3.06 cm
Single Plane A2C EF: 71.3 %
Single Plane A4C EF: 74.3 %

## 2022-04-11 NOTE — Progress Notes (Signed)
lmtcb

## 2022-04-12 ENCOUNTER — Telehealth: Payer: Self-pay | Admitting: *Deleted

## 2022-04-12 NOTE — Progress Notes (Signed)
Per Karen Kays @ [facility], patient made aware of results.

## 2022-04-12 NOTE — Telephone Encounter (Signed)
Pt returning call to office. Pt notified of carotid US results.

## 2022-04-23 ENCOUNTER — Other Ambulatory Visit: Payer: Self-pay | Admitting: Family Medicine

## 2022-04-23 DIAGNOSIS — M1A9XX0 Chronic gout, unspecified, without tophus (tophi): Secondary | ICD-10-CM

## 2022-04-24 ENCOUNTER — Ambulatory Visit (INDEPENDENT_AMBULATORY_CARE_PROVIDER_SITE_OTHER): Payer: Medicare Other | Admitting: Family Medicine

## 2022-04-24 ENCOUNTER — Encounter: Payer: Self-pay | Admitting: Family Medicine

## 2022-04-24 VITALS — BP 154/59 | HR 63 | Temp 98.4°F | Ht 68.0 in | Wt 206.8 lb

## 2022-04-24 DIAGNOSIS — E1122 Type 2 diabetes mellitus with diabetic chronic kidney disease: Secondary | ICD-10-CM

## 2022-04-24 DIAGNOSIS — Z125 Encounter for screening for malignant neoplasm of prostate: Secondary | ICD-10-CM | POA: Diagnosis not present

## 2022-04-24 DIAGNOSIS — I10 Essential (primary) hypertension: Secondary | ICD-10-CM | POA: Diagnosis not present

## 2022-04-24 DIAGNOSIS — E7800 Pure hypercholesterolemia, unspecified: Secondary | ICD-10-CM

## 2022-04-24 LAB — BAYER DCA HB A1C WAIVED: HB A1C (BAYER DCA - WAIVED): 5.8 % — ABNORMAL HIGH (ref 4.8–5.6)

## 2022-04-24 NOTE — Progress Notes (Signed)
Subjective:  Patient ID: Connor Bryant, male    DOB: 01-14-1946  Age: 76 y.o. MRN: [id number]  CC: Medical Management of Chronic Issues   HPI Connor Bryant presents forFollow-up of diabetes. Patient unwilling to check blood sugar at home. Patient denies symptoms such as polyuria, polydipsia, excessive hunger, nausea No significant hypoglycemic spells noted. Medications reviewed. Pt reports taking them regularly without complication/adverse reaction being reported today.   Atrial fibrillation follow up. Pt. is treated with rate control and anticoagulation. Pt.  denies palpitations, rapid rate, chest pain, dyspnea and edema. There has been no bleeding from nose or gums. Pt. has not noticed blood with urine or stool.  Although there is routine bruising easily, it is not excessive. Cardiology increased carvedilol due to pt. C/o heart racing.   presents for  follow-up of hypertension. Patient has no history of headache chest pain or shortness of breath or recent cough. Patient also denies symptoms of TIA such as focal numbness or weakness. Patient denies side effects from medication. States taking it regularly. Reports white coat syndrome. 118-130/62 approx. When checked a home. ;    History Connor Bryant has a past medical history of Acute CVA (cerebrovascular accident) ([place]) (05/01/2020), Acute respiratory failure with hypoxia ([place]) (05/04/2020), Arthritis, DM type 2 (diabetes mellitus, type 2) ([place]), Endotracheally intubated, Gallstone, Heart murmur, HTN (hypertension), Hyperlipidemia, Personal history of colonic polyp-adenoma (11/24/2013), Renal insufficiency, and Thoracic spine fracture ([place]).   He has a past surgical history that includes Debridement tennis elbow (Right); Tonsillectomy and adenoidectomy; Colonoscopy; Esophagogastroduodenoscopy; Mesenteric artery bypass (N/A, 03/10/2014); abdominal aortagram (N/A, 02/23/2014); vascualr surgery; and Cataract extraction, bilateral.   His family history  includes AAA (abdominal aortic aneurysm) in his mother; Bladder Cancer in his father; Cancer in his father; Coronary artery disease in his father; Diabetes in his father and mother; Heart attack in his father; Heart disease in his father and mother; Hyperlipidemia in his father and mother; Hypertension in his father and mother.He reports that he quit smoking about 12 years ago. His smoking use included cigarettes. He has never used smokeless tobacco. He reports current alcohol use of about 12.0 standard drinks of alcohol per week. He reports that he does not currently use drugs.  Current Outpatient Medications on File Prior to Visit  Medication Sig Dispense Refill   allopurinol (ZYLOPRIM) 100 MG tablet TAKE 1 TABLET 2 TO 3 TIMES A DAY FOR GOUT 90 tablet 0   amLODipine (NORVASC) 5 MG tablet Take 1 tablet (5 mg total) by mouth daily. For blood pressure 90 tablet 3   apixaban (ELIQUIS) 5 MG TABS tablet Take 1 tablet (5 mg total) by mouth 2 (two) times daily. 180 tablet 2   atorvastatin (LIPITOR) 80 MG tablet Take 1 tablet (80 mg total) by mouth daily. 90 tablet 2   carvedilol (COREG) 25 MG tablet Take 1 tablet (25 mg total) by mouth 2 (two) times daily. 180 tablet 1   chlorthalidone (HYGROTON) 25 MG tablet Take 25 mg by mouth every morning.     glimepiride (AMARYL) 2 MG tablet TAKE (1) TABLET DAILY IN THE MORNING. 90 tablet 3   hydrALAZINE (APRESOLINE) 100 MG tablet Take 1 tablet (100 mg total) by mouth 3 (three) times daily. 270 tablet 3   isosorbide mononitrate (IMDUR) 30 MG 24 hr tablet Take 1 tablet (30 mg total) by mouth daily. 90 tablet 3   levocetirizine (XYZAL) 5 MG tablet Take 1 tablet every evening. 90 tablet 1   lisinopril (ZESTRIL) 20  MG tablet Take 1 tablet (20 mg total) by mouth daily. 30 tablet 11   fluticasone (FLONASE) 50 MCG/ACT nasal spray Place 2 sprays into both nostrils daily. (Patient not taking: Reported on 04/24/2022) 16 g 6   polyethylene glycol (MIRALAX / GLYCOLAX) packet Take  17 g by mouth daily. (Patient not taking: Reported on 04/24/2022) 30 packet 1   No current facility-administered medications on file prior to visit.    ROS Review of Systems  Constitutional:  Negative for fever.  Respiratory:  Negative for shortness of breath.   Cardiovascular:  Negative for chest pain.  Musculoskeletal:  Negative for arthralgias.  Skin:  Negative for rash.    Objective:  BP (!) 154/59   Pulse 63   Temp 98.4 F (36.9 C)   Ht _0  (1.727 m)   Wt 206 lb 12.8 oz (93.8 kg)   SpO2 (!) 63%   BMI 31.44 kg/m   BP Readings from Last 3 Encounters:  04/24/22 (!) 154/59  04/02/22 (!) 176/70  01/22/22 (!) 160/56    Wt Readings from Last 3 Encounters:  04/24/22 206 lb 12.8 oz (93.8 kg)  04/02/22 207 lb 3.2 oz (94 kg)  01/22/22 207 lb 9.6 oz (94.2 kg)     Physical Exam Vitals reviewed.  Constitutional:      Appearance: He is well-developed.  HENT:     Head: Normocephalic and atraumatic.     Right Ear: External ear normal.     Left Ear: External ear normal.     Mouth/Throat:     Pharynx: No oropharyngeal exudate or posterior oropharyngeal erythema.  Eyes:     Pupils: Pupils are equal, round, and reactive to light.  Cardiovascular:     Rate and Rhythm: Normal rate and regular rhythm.     Heart sounds: No murmur heard. Pulmonary:     Effort: No respiratory distress.     Breath sounds: Normal breath sounds.  Musculoskeletal:     Cervical back: Normal range of motion and neck supple.  Neurological:     Mental Status: He is alert and oriented to person, place, and time.       Assessment & Plan:   Kadir was seen today for medical management of chronic issues.  Diagnoses and all orders for this visit:  Type 2 diabetes mellitus with chronic kidney disease, without long-term current use of insulin, unspecified CKD stage (HCC) -     Bayer DCA Hb A1c Waived  Essential hypertension -     CBC with Differential/Platelet -     CMP14+EGFR  Pure  hypercholesterolemia -     Lipid panel  Screening for prostate cancer -     PSA, total and free      I have discontinued Connor Bryant "[name]"'s CoQ10 and prednisoLONE acetate. I am also having him maintain his polyethylene glycol, fluticasone, apixaban, glimepiride, lisinopril, hydrALAZINE, amLODipine, levocetirizine, isosorbide mononitrate, chlorthalidone, atorvastatin, carvedilol, and allopurinol.  No orders of the defined types were placed in this encounter.    Follow-up: Return in about 6 months (around 10/25/2022) for Compete physical, diabetes.  Claretta Fraise, M.D.

## 2022-04-25 LAB — CBC WITH DIFFERENTIAL/PLATELET
Basophils Absolute: 0 10*3/uL (ref 0.0–0.2)
Basos: 0 %
EOS (ABSOLUTE): 0.3 10*3/uL (ref 0.0–0.4)
Eos: 5 %
Hematocrit: 35.6 % — ABNORMAL LOW (ref 37.5–51.0)
Hemoglobin: 12.1 g/dL — ABNORMAL LOW (ref 13.0–17.7)
Immature Grans (Abs): 0 10*3/uL (ref 0.0–0.1)
Immature Granulocytes: 0 %
Lymphocytes Absolute: 1.4 10*3/uL (ref 0.7–3.1)
Lymphs: 21 %
MCH: 31.7 pg (ref 26.6–33.0)
MCHC: 34 g/dL (ref 31.5–35.7)
MCV: 93 fL (ref 79–97)
Monocytes Absolute: 0.7 10*3/uL (ref 0.1–0.9)
Monocytes: 11 %
Neutrophils Absolute: 4.1 10*3/uL (ref 1.4–7.0)
Neutrophils: 63 %
Platelets: 200 10*3/uL (ref 150–450)
RBC: 3.82 x10E6/uL — ABNORMAL LOW (ref 4.14–5.80)
RDW: 13.9 % (ref 11.6–15.4)
WBC: 6.6 10*3/uL (ref 3.4–10.8)

## 2022-04-25 LAB — LIPID PANEL
Chol/HDL Ratio: 1.9 ratio (ref 0.0–5.0)
Cholesterol, Total: 150 mg/dL (ref 100–199)
HDL: 81 mg/dL (ref >39)
LDL Chol Calc (NIH): 53 mg/dL (ref 0–99)
Triglycerides: 83 mg/dL (ref 0–149)
VLDL Cholesterol Cal: 16 mg/dL (ref 5–40)

## 2022-04-25 LAB — CMP14+EGFR
ALT: 19 IU/L (ref 0–44)
AST: 16 IU/L (ref 0–40)
Albumin/Globulin Ratio: 1.7 (ref 1.2–2.2)
Albumin: 4.2 g/dL (ref 3.8–4.8)
Alkaline Phosphatase: 101 IU/L (ref 44–121)
BUN/Creatinine Ratio: 28 — ABNORMAL HIGH (ref 10–24)
BUN: 40 mg/dL — ABNORMAL HIGH (ref 8–27)
Bilirubin Total: 0.4 mg/dL (ref 0.0–1.2)
CO2: 21 mmol/L (ref 20–29)
Calcium: 9.3 mg/dL (ref 8.6–10.2)
Chloride: 106 mmol/L (ref 96–106)
Creatinine, Ser: 1.42 mg/dL — ABNORMAL HIGH (ref 0.76–1.27)
Globulin, Total: 2.5 g/dL (ref 1.5–4.5)
Glucose: 108 mg/dL — ABNORMAL HIGH (ref 70–99)
Potassium: 5.1 mmol/L (ref 3.5–5.2)
Sodium: 142 mmol/L (ref 134–144)
Total Protein: 6.7 g/dL (ref 6.0–8.5)
eGFR: 51 mL/min/{1.73_m2} — ABNORMAL LOW (ref >59)

## 2022-04-25 LAB — PSA, TOTAL AND FREE
PSA, Free Pct: 25.5 %
PSA, Free: 0.28 ng/mL
Prostate Specific Ag, Serum: 1.1 ng/mL (ref 0.0–4.0)

## 2022-05-02 ENCOUNTER — Telehealth: Payer: Self-pay | Admitting: *Deleted

## 2022-05-02 NOTE — Telephone Encounter (Signed)
-----   Message from Arnoldo Lenis, MD sent at 04/30/2022 12:53 PM EDT ----- Heart pumping function is normal. Aortic valve is mild to moderately stiff, just something to monitor at this time  Zandra Abts MD

## 2022-05-02 NOTE — Telephone Encounter (Signed)
Laurine Blazer, LPN  0/11/3531 17:40 AM EDT Back to Top    Notified, copy to pcp.

## 2022-05-08 ENCOUNTER — Other Ambulatory Visit: Payer: Self-pay | Admitting: Family Medicine

## 2022-05-10 ENCOUNTER — Other Ambulatory Visit: Payer: Self-pay | Admitting: Cardiology

## 2022-05-10 ENCOUNTER — Other Ambulatory Visit: Payer: Self-pay | Admitting: *Deleted

## 2022-05-10 DIAGNOSIS — I6523 Occlusion and stenosis of bilateral carotid arteries: Secondary | ICD-10-CM

## 2022-05-10 NOTE — Patient Outreach (Signed)
  Care Coordination   05/10/2022 Name: TAFT WORTHING MRN: [id number] DOB: 1946-03-14   Care Coordination Outreach Attempts:  Contact was made with the patient today to offer care coordination services as a benefit of their health plan. Patient declined services. Follow Up Plan:  No further outreach attempts will be made at this time.   Encounter Outcome:  Pt. Visit Completed  Care Coordination Interventions Activated:  No   Care Coordination Interventions:  No, not indicated    Emelia Loron RN, BSN [place] [phone] [email or web address]

## 2022-05-29 ENCOUNTER — Other Ambulatory Visit: Payer: Self-pay

## 2022-05-29 ENCOUNTER — Emergency Department (HOSPITAL_BASED_OUTPATIENT_CLINIC_OR_DEPARTMENT_OTHER)
Admission: EM | Admit: 2022-05-29 | Discharge: 2022-05-29 | Disposition: A | Discharge status: Home / Self Care | Payer: Medicare Other | Attending: Emergency Medicine | Admitting: Emergency Medicine

## 2022-05-29 ENCOUNTER — Emergency Department (HOSPITAL_BASED_OUTPATIENT_CLINIC_OR_DEPARTMENT_OTHER): Payer: Medicare Other | Admitting: Radiology

## 2022-05-29 ENCOUNTER — Encounter (HOSPITAL_BASED_OUTPATIENT_CLINIC_OR_DEPARTMENT_OTHER): Payer: Self-pay | Admitting: Obstetrics and Gynecology

## 2022-05-29 DIAGNOSIS — Z7984 Long term (current) use of oral hypoglycemic drugs: Secondary | ICD-10-CM | POA: Diagnosis not present

## 2022-05-29 DIAGNOSIS — M25552 Pain in left hip: Secondary | ICD-10-CM | POA: Diagnosis not present

## 2022-05-29 DIAGNOSIS — E119 Type 2 diabetes mellitus without complications: Secondary | ICD-10-CM | POA: Diagnosis not present

## 2022-05-29 DIAGNOSIS — Z79899 Other long term (current) drug therapy: Secondary | ICD-10-CM | POA: Insufficient documentation

## 2022-05-29 DIAGNOSIS — I10 Essential (primary) hypertension: Secondary | ICD-10-CM | POA: Insufficient documentation

## 2022-05-29 DIAGNOSIS — M1612 Unilateral primary osteoarthritis, left hip: Secondary | ICD-10-CM | POA: Insufficient documentation

## 2022-05-29 MED ORDER — LIDOCAINE 5 % EX PTCH
1.0000 | MEDICATED_PATCH | CUTANEOUS | Status: DC
  Administered 2022-05-29: 1 via TRANSDERMAL
  Filled 2022-05-29: qty 1

## 2022-05-29 MED ORDER — ACETAMINOPHEN 500 MG PO TABS
1000.0000 mg | ORAL_TABLET | Freq: Four times a day (QID) | ORAL | Status: DC | PRN
  Administered 2022-05-29: 1000 mg via ORAL
  Filled 2022-05-29: qty 2

## 2022-05-29 NOTE — ED Triage Notes (Signed)
Patient reports to the ER for left hip pain. Patient reports this has never happened before. Reports intermittent pain radiation down the leg.

## 2022-05-29 NOTE — Discharge Instructions (Signed)
Please return to the ED with any new symptoms such as fevers, nausea or vomiting, redness to the skin overlying the hip Please follow-up with your PCP for further management Please read attached guide concerning osteoarthritis and hip pain Please treat your pain at home utilizing Tylenol/ibuprofen, ice and heat packs, rest You may also purchase Salonpas patches at the grocery store/pharmacy

## 2022-05-29 NOTE — ED Notes (Signed)
Patient given discharge instructions. Questions were answered. Patient verbalized understanding of discharge instructions and care at home.  

## 2022-05-29 NOTE — ED Provider Notes (Signed)
[facility] EMERGENCY DEPT Provider Note   CSN: [id number] Arrival date & time: 05/29/22  5462     History  Chief Complaint  Patient presents with   Hip Pain    Connor Bryant is a 76 y.o. male with medical history significant for acute CVA, type 2 diabetes, hypertension, heart murmur, renal insufficiency.  Patient presents to ED for evaluation of left hip pain.  Patient states that on Thursday he woke up with hip pain that he rated as 3 out of 10 when lying down, 8 out of 10 when standing up.  Patient states that this hip pain has remained constant since Thursday.  Patient denies taking any over-the-counter medications prior to arrival.  Patient denies any trauma to account for the pain in his left hip.  Patient denies any fevers, numbness or tingling, back pain.   Hip Pain       Home Medications Prior to Admission medications   Medication Sig Start Date End Date Taking? Authorizing Provider  allopurinol (ZYLOPRIM) 100 MG tablet TAKE 1 TABLET 2 TO 3 TIMES A DAY FOR GOUT 04/23/22   Claretta Fraise, MD  amLODipine (NORVASC) 5 MG tablet Take 1 tablet (5 mg total) by mouth daily. For blood pressure 10/24/21   Claretta Fraise, MD  atorvastatin (LIPITOR) 80 MG tablet Take 1 tablet (80 mg total) by mouth daily. 01/22/22   Claretta Fraise, MD  carvedilol (COREG) 25 MG tablet Take 1 tablet (25 mg total) by mouth 2 (two) times daily. 04/02/22   Arnoldo Lenis, MD  chlorthalidone (HYGROTON) 25 MG tablet Take 25 mg by mouth every morning. 12/12/21   [provider]  ELIQUIS 5 MG TABS tablet TAKE ONE TABLET TWICE DAILY 05/08/22   Claretta Fraise, MD  fluticasone (FLONASE) 50 MCG/ACT nasal spray Place 2 sprays into both nostrils daily. Patient not taking: Reported on 04/24/2022 06/14/21   Baruch Gouty, FNP  glimepiride (AMARYL) 2 MG tablet TAKE (1) TABLET DAILY IN THE MORNING. 07/24/21   Claretta Fraise, MD  hydrALAZINE (APRESOLINE) 100 MG tablet Take 1 tablet (100 mg total) by  mouth 3 (three) times daily. 10/24/21   Claretta Fraise, MD  isosorbide mononitrate (IMDUR) 30 MG 24 hr tablet Take 1 tablet (30 mg total) by mouth daily. 12/05/21   Arnoldo Lenis, MD  levocetirizine (XYZAL) 5 MG tablet Take 1 tablet every evening. 11/30/21   Claretta Fraise, MD  lisinopril (ZESTRIL) 20 MG tablet Take 1 tablet (20 mg total) by mouth daily. 07/25/21 07/20/22  Claretta Fraise, MD  polyethylene glycol (MIRALAX / Floria Raveling) packet Take 17 g by mouth daily. Patient not taking: Reported on 04/24/2022 10/02/18   Caren Griffins, MD      Allergies    Patient has no known allergies.    Review of Systems   Review of Systems  Constitutional:  Negative for fever.  Musculoskeletal:  Positive for arthralgias and myalgias. Negative for back pain.  All other systems reviewed and are negative.   Physical Exam Updated Vital Signs BP (!) 130/50 (BP Location: Right Arm)   Pulse 64   Temp 97.8 F (36.6 C)   Resp 16   Ht '5\' 8"'$  (1.727 m)   Wt 93.4 kg   SpO2 98%   BMI 31.32 kg/m  Physical Exam Vitals and nursing note reviewed.  Constitutional:      General: He is not in acute distress.    Appearance: Normal appearance. He is not ill-appearing, toxic-appearing or diaphoretic.  HENT:  Head: Normocephalic and atraumatic.     Nose: No congestion.     Mouth/Throat:     Mouth: Mucous membranes are moist.     Pharynx: Oropharynx is clear.  Eyes:     Extraocular Movements: Extraocular movements intact.     Conjunctiva/sclera: Conjunctivae normal.     Pupils: Pupils are equal, round, and reactive to light.  Cardiovascular:     Rate and Rhythm: Normal rate and regular rhythm.  Pulmonary:     Effort: Pulmonary effort is normal.     Breath sounds: Normal breath sounds. No wheezing.  Abdominal:     General: Abdomen is flat. Bowel sounds are normal.     Palpations: Abdomen is soft.     Tenderness: There is no abdominal tenderness.  Musculoskeletal:     Cervical back: Normal range of  motion and neck supple. No tenderness.     Right hip: Normal.     Left hip: Tenderness present. No deformity or lacerations. Normal range of motion.     Comments: Patient with full passive range of motion.  Patient with worsening pain with abduction, adduction.  Patient has no pain with flexion or extension.  Patient shows steady gait.  Skin:    General: Skin is warm and dry.     Capillary Refill: Capillary refill takes less than 2 seconds.  Neurological:     Mental Status: He is alert and oriented to person, place, and time.     ED Results / Procedures / Treatments   Labs (all labs ordered are listed, but only abnormal results are displayed) Labs Reviewed - No data to display  EKG None  Radiology DG Hip Unilat W or Wo Pelvis 2-3 Views Left  Result Date: 05/29/2022 CLINICAL DATA:  Left hip pain for 5 days.  No known injury. EXAM: DG HIP (WITH OR WITHOUT PELVIS) 2-3V LEFT COMPARISON:  Pelvic CT 09/30/2018. FINDINGS: The bones appear adequately mineralized. There is no evidence of acute fracture or dislocation. There is no evidence of femoral head osteonecrosis. The hip joint spaces are preserved. There are partially bridging anterior osteophytes at both sacroiliac joints as correlated with previous CT. No acute soft tissue abnormalities are identified. There is diffuse iliofemoral atherosclerosis with a right iliac stent. IMPRESSION: No acute osseous findings or significant hip arthropathy. Sacroiliac degenerative changes bilaterally. Electronically Signed   By: Richardean Sale M.D.   On: 05/29/2022 10:18    Procedures Procedures   Medications Ordered in ED Medications  lidocaine (LIDODERM) 5 % 1 patch (1 patch Transdermal Patch Applied 05/29/22 1107)  acetaminophen (TYLENOL) tablet 1,000 mg (1,000 mg Oral Given 05/29/22 1107)    ED Course/ Medical Decision Making/ A&P                           Medical Decision Making Amount and/or Complexity of Data Reviewed Radiology:  ordered.   76 year old male presents to ED for evaluation.  Please see HPI for further details.  On examination, the patient is afebrile and nontachycardic.  Patient lung sounds clear bilaterally, not hypoxic.  Patient abdomen soft and compressible.  Patient left hip has no overlying skin change, there is no deformity, crepitus.  There is no obvious effusion.  The patient has full passive range of motion to his left hip.  Actively, the patient has decreased range of motion with abduction, adduction secondary to pain.  The patient has no issues flexing or extending his hip.  Patient shows  steady gait.  Plain film imaging of patient left hip does not show any obvious pathology to account for the patient hip pain.  There are osteophytes noted.  The patient does endorse a significant history of rheumatological diseases in his family, states he has osteoarthritis in his left shoulder.  At this time, most likely cause of patient pain due to arthritis.  The patient will be advised to treat his pain conservatively at home utilizing Tylenol and ibuprofen.  The patient was also counseled on the benefits of physical therapy, stretches as well as ice and heat packs.  Patient voiced understanding of my instructions.  The patient was advised to follow-up with his PCP for further management.  The patient voiced understanding of my instructions.  The patient was given return precautions and he voiced understanding.  The patient had all of his questions answered his satisfaction.  The patient is stable at this time for discharge home.  Final Clinical Impression(s) / ED Diagnoses Final diagnoses:  Osteoarthritis of left hip, unspecified osteoarthritis type    Rx / DC Orders ED Discharge Orders     None         Azucena Cecil, PA-C 05/29/22 1115    Blanchie Dessert, MD 05/30/22 1127

## 2022-06-05 ENCOUNTER — Encounter: Payer: Self-pay | Admitting: Family Medicine

## 2022-06-05 ENCOUNTER — Ambulatory Visit (INDEPENDENT_AMBULATORY_CARE_PROVIDER_SITE_OTHER): Payer: Medicare Other | Admitting: Family Medicine

## 2022-06-05 ENCOUNTER — Other Ambulatory Visit: Payer: Self-pay | Admitting: Family Medicine

## 2022-06-05 VITALS — BP 138/70 | HR 67 | Temp 98.0°F | Ht 68.0 in | Wt 203.4 lb

## 2022-06-05 DIAGNOSIS — M1A9XX0 Chronic gout, unspecified, without tophus (tophi): Secondary | ICD-10-CM

## 2022-06-05 DIAGNOSIS — M76892 Other specified enthesopathies of left lower limb, excluding foot: Secondary | ICD-10-CM | POA: Diagnosis not present

## 2022-06-05 MED ORDER — BETAMETHASONE SOD PHOS & ACET 6 (3-3) MG/ML IJ SUSP
6.0000 mg | Freq: Once | INTRAMUSCULAR | Status: AC
  Administered 2022-06-05: 6 mg via INTRAMUSCULAR

## 2022-06-05 NOTE — Progress Notes (Signed)
Subjective:  Patient ID: Connor Bryant, male    DOB: [birth year]  Age: 76 y.o. MRN: [id number]  CC: ER FOLLOW UP   HPI DERIAN DIMALANTA presents for awoke on 8/31 with left hip pain. NKI. XR done at [facility] showed arthritis. Salonpas recommended along with tylenol. Tylenol helps a little. Worse on arising q AM.      06/05/2022    1:42 PM 04/24/2022    1:33 PM 01/22/2022    2:51 PM  Depression screen PHQ 2/9  Decreased Interest 0 0 0  Down, Depressed, Hopeless 0 0 0  PHQ - 2 Score 0 0 0    History Medardo has a past medical history of Acute CVA (cerebrovascular accident) ([place]) (05/01/2020), Acute respiratory failure with hypoxia ([place]) (05/04/2020), Arthritis, DM type 2 (diabetes mellitus, type 2) ([place]), Endotracheally intubated, Gallstone, Heart murmur, HTN (hypertension), Hyperlipidemia, Personal history of colonic polyp-adenoma (11/24/2013), Renal insufficiency, and Thoracic spine fracture ([place]).   He has a past surgical history that includes Debridement tennis elbow (Right); Tonsillectomy and adenoidectomy; Colonoscopy; Esophagogastroduodenoscopy; Mesenteric artery bypass (N/A, 03/10/2014); abdominal aortagram (N/A, 02/23/2014); vascualr surgery; and Cataract extraction, bilateral.   His family history includes AAA (abdominal aortic aneurysm) in his mother; Bladder Cancer in his father; Cancer in his father; Coronary artery disease in his father; Diabetes in his father and mother; Heart attack in his father; Heart disease in his father and mother; Hyperlipidemia in his father and mother; Hypertension in his father and mother.He reports that he quit smoking about 12 years ago. His smoking use included cigarettes. He has never used smokeless tobacco. He reports current alcohol use of about 12.0 standard drinks of alcohol per week. He reports that he does not currently use drugs.    ROS Review of Systems  Objective:  BP 138/70   Pulse 67   Temp 98 F (36.7 C)   Ht '5\' 8"'$  (1.727 m)   Wt 203 lb  6.4 oz (92.3 kg)   SpO2 96%   BMI 30.93 kg/m   BP Readings from Last 3 Encounters:  06/05/22 138/70  05/29/22 (!) 130/50  04/24/22 (!) 154/59    Wt Readings from Last 3 Encounters:  06/05/22 203 lb 6.4 oz (92.3 kg)  05/29/22 206 lb (93.4 kg)  04/24/22 206 lb 12.8 oz (93.8 kg)     Physical Exam Musculoskeletal:        General: Tenderness (left hip anteriorly at the iliopsoas) present. No deformity. Normal range of motion.  Skin:    General: Skin is warm and dry.       Assessment & Plan:   Onyx was seen today for er follow up.  Diagnoses and all orders for this visit:  Tendonitis of left hip flexor -     betamethasone acetate-betamethasone sodium phosphate (CELESTONE) injection 6 mg       I am having Arlana Pouch "[name]" maintain his polyethylene glycol, fluticasone, glimepiride, lisinopril, hydrALAZINE, amLODipine, isosorbide mononitrate, chlorthalidone, atorvastatin, carvedilol, Eliquis, allopurinol, and levocetirizine. We administered betamethasone acetate-betamethasone sodium phosphate.  Allergies as of 06/05/2022   No Known Allergies      Medication List        Accurate as of June 05, 2022  5:21 PM. If you have any questions, ask your nurse or doctor.          allopurinol 100 MG tablet Commonly known as: ZYLOPRIM TAKE 1 TABLET 2 TO 3 TIMES A DAY FOR GOUT   amLODipine 5 MG tablet Commonly known  as: NORVASC Take 1 tablet (5 mg total) by mouth daily. For blood pressure   atorvastatin 80 MG tablet Commonly known as: LIPITOR Take 1 tablet (80 mg total) by mouth daily.   carvedilol 25 MG tablet Commonly known as: COREG Take 1 tablet (25 mg total) by mouth 2 (two) times daily.   chlorthalidone 25 MG tablet Commonly known as: HYGROTON Take 25 mg by mouth every morning.   Eliquis 5 MG Tabs tablet Generic drug: apixaban TAKE ONE TABLET TWICE DAILY   fluticasone 50 MCG/ACT nasal spray Commonly known as: FLONASE Place 2 sprays into  both nostrils daily.   glimepiride 2 MG tablet Commonly known as: AMARYL TAKE (1) TABLET DAILY IN THE MORNING.   hydrALAZINE 100 MG tablet Commonly known as: APRESOLINE Take 1 tablet (100 mg total) by mouth 3 (three) times daily.   isosorbide mononitrate 30 MG 24 hr tablet Commonly known as: IMDUR Take 1 tablet (30 mg total) by mouth daily.   levocetirizine 5 MG tablet Commonly known as: XYZAL Take 1 tablet every evening.   lisinopril 20 MG tablet Commonly known as: ZESTRIL Take 1 tablet (20 mg total) by mouth daily.   polyethylene glycol 17 g packet Commonly known as: MIRALAX / GLYCOLAX Take 17 g by mouth daily.         Follow-up: No follow-ups on file.  Claretta Fraise, M.D.

## 2022-07-19 DIAGNOSIS — N183 Chronic kidney disease, stage 3 unspecified: Secondary | ICD-10-CM | POA: Diagnosis not present

## 2022-07-23 DIAGNOSIS — E872 Acidosis, unspecified: Secondary | ICD-10-CM | POA: Diagnosis not present

## 2022-07-23 DIAGNOSIS — I5020 Unspecified systolic (congestive) heart failure: Secondary | ICD-10-CM | POA: Diagnosis not present

## 2022-07-23 DIAGNOSIS — N183 Chronic kidney disease, stage 3 unspecified: Secondary | ICD-10-CM | POA: Diagnosis not present

## 2022-07-23 DIAGNOSIS — I739 Peripheral vascular disease, unspecified: Secondary | ICD-10-CM | POA: Diagnosis not present

## 2022-07-23 DIAGNOSIS — I2510 Atherosclerotic heart disease of native coronary artery without angina pectoris: Secondary | ICD-10-CM | POA: Diagnosis not present

## 2022-07-23 DIAGNOSIS — I639 Cerebral infarction, unspecified: Secondary | ICD-10-CM | POA: Diagnosis not present

## 2022-07-23 DIAGNOSIS — I129 Hypertensive chronic kidney disease with stage 1 through stage 4 chronic kidney disease, or unspecified chronic kidney disease: Secondary | ICD-10-CM | POA: Diagnosis not present

## 2022-08-06 ENCOUNTER — Other Ambulatory Visit: Payer: Self-pay | Admitting: Family Medicine

## 2022-09-04 ENCOUNTER — Other Ambulatory Visit: Payer: Self-pay | Admitting: Family Medicine

## 2022-09-04 DIAGNOSIS — M1A9XX0 Chronic gout, unspecified, without tophus (tophi): Secondary | ICD-10-CM

## 2022-09-26 LAB — HM DIABETES EYE EXAM

## 2022-10-03 ENCOUNTER — Encounter: Payer: Self-pay | Admitting: Cardiology

## 2022-10-03 ENCOUNTER — Ambulatory Visit: Payer: Medicare Other | Attending: Cardiology | Admitting: Cardiology

## 2022-10-03 VITALS — BP 136/60 | HR 62 | Ht 68.0 in | Wt 202.8 lb

## 2022-10-03 DIAGNOSIS — Z8679 Personal history of other diseases of the circulatory system: Secondary | ICD-10-CM

## 2022-10-03 DIAGNOSIS — I350 Nonrheumatic aortic (valve) stenosis: Secondary | ICD-10-CM

## 2022-10-03 DIAGNOSIS — I10 Essential (primary) hypertension: Secondary | ICD-10-CM | POA: Diagnosis not present

## 2022-10-03 DIAGNOSIS — E782 Mixed hyperlipidemia: Secondary | ICD-10-CM | POA: Diagnosis not present

## 2022-10-03 DIAGNOSIS — I480 Paroxysmal atrial fibrillation: Secondary | ICD-10-CM | POA: Diagnosis not present

## 2022-10-03 NOTE — Patient Instructions (Signed)
Medication Instructions:  Continue all current medications.   Labwork: none  Testing/Procedures: none  Follow-Up: 6 months   Any Other Special Instructions Will Be Listed Below (If Applicable).   If you need a refill on your cardiac medications before your next appointment, please call your pharmacy.  

## 2022-10-03 NOTE — Progress Notes (Signed)
Clinical Summary Connor Bryant is a 77 y.o.male seen today for follow up of the following medical problems.    1. NSTEMI/Stress induced CM - NSTEMI druing admission where he presented with CVA initially - peak trop 2500  - 04/2020 echo LVEF 35% (acute change from 05/02/20 echo) with apical akinesis. - possible acute plaque rupture vs coronary embolic event given presented with new onset afib and stroke vs. Stress induced CM/Takotsubo CM given echo findings - admission complicated by PE, hematuria, anemia, AKI given recent stroke and respiratory failure on ventilator did not pursue emergent cath   06/2020 echo LVEF 60-65%, no WMAs  03/2022 echo LVEF 60-65% - no cardiac chest pain. Has had some DOE moderate to high levels of activity, sedentary lifestyle.      2. Chronic sysotlic HF - new diagnosis during admission with CVA - - 04/2020 echo LVEF 35% (acute change from 05/02/20 echo) with apical akinesis.  - cath not pursued at that time due to CVA. Admission also complicated with PE, hematuria, anemia, AKI     06/2020 echo LVEF 60-65%, no WMAs - clincal scenario would suggest this may have been a stress induced CM in setting of his CVA 03/2022 echo LVEF 60-65%   - no SOB/DOE. No recent edema       2. PAF - new diagnosis during admission with CVA - 07/2020 monitor without recurrent afib     - no recent palpitations, though reports can have significant spells for days at a time then quite periods for weeks to months at a time.  - no bleeding on eliquis.     - rare palpitations, short in duration - compliant with meds. Improved with increased coreg - no bleeding on eliquis.    3. CVA - per pcp     4. PE - has been on eliquis   5. Anemia     6. PAD  s/p prior mesenteric artery bypass and recent bilateral iliac stenting at [facility] on 04/27/2020 - now off plavix, had noted some excessive gum bleeding. Just on eliquis - continues to f/u with vascualr, at 2/023 they had  said just f/u as needed    7. Aortic stenosis - mild to moderate by 06/2020 echo, AVA VTI 1.18 and mean grad 16.  - no recent symptoms  03/2022 echo: LVEF 60-65%, grade I dd, mild to mod AS mean grad 28 AVA VTI 1.2   8. HTN -significant white coat HTN history - compliant with meds   9. Carotid stenosis - noted on MRA - due for repeat imaging [place]  03/2022 carotid US RICA 4-40%, LICA 34-74%   10. CKD - followed by Dr Joelyn Oms   11.Hyperlipidemia - 01/2022 TC 151 TG 129 HDL 62 LDL 67 - he is on atorvastatin  04/2022 TC 150 TG 83 HDL 81 LDL 53     Past Medical History:  Diagnosis Date   Acute CVA (cerebrovascular accident) ([place]) 05/01/2020   Acute respiratory failure with hypoxia ([place]) 05/04/2020   Arthritis    DM type 2 (diabetes mellitus, type 2) ([place])    no meds   Endotracheally intubated    Gallstone    Heart murmur    was told one time he had a murmur, not since 30 years ago   HTN (hypertension)    Hyperlipidemia    Personal history of colonic polyp-adenoma 11/24/2013   Renal insufficiency    mild   Thoracic spine fracture ([place])  No Known Allergies   Current Outpatient Medications  Medication Sig Dispense Refill   allopurinol (ZYLOPRIM) 100 MG tablet TAKE 1 TABLET 2 TO 3 TIMES A DAY FOR GOUT 90 tablet 1   amLODipine (NORVASC) 5 MG tablet Take 1 tablet (5 mg total) by mouth daily. For blood pressure 90 tablet 3   atorvastatin (LIPITOR) 80 MG tablet Take 1 tablet (80 mg total) by mouth daily. 90 tablet 2   carvedilol (COREG) 25 MG tablet Take 1 tablet (25 mg total) by mouth 2 (two) times daily. 180 tablet 1   chlorthalidone (HYGROTON) 25 MG tablet Take 25 mg by mouth every morning.     ELIQUIS 5 MG TABS tablet TAKE ONE TABLET TWICE DAILY 180 tablet 1   fluticasone (FLONASE) 50 MCG/ACT nasal spray Place 2 sprays into both nostrils daily. (Patient not taking: Reported on 04/24/2022) 16 g 6   glimepiride (AMARYL) 2 MG tablet TAKE ONE TABLET ONCE DAILY EVERY MORNING  90 tablet 0   hydrALAZINE (APRESOLINE) 100 MG tablet Take 1 tablet (100 mg total) by mouth 3 (three) times daily. 270 tablet 3   isosorbide mononitrate (IMDUR) 30 MG 24 hr tablet Take 1 tablet (30 mg total) by mouth daily. 90 tablet 3   levocetirizine (XYZAL) 5 MG tablet Take 1 tablet every evening. 90 tablet 1   lisinopril (ZESTRIL) 20 MG tablet Take 1 tablet (20 mg total) by mouth daily. 30 tablet 11   polyethylene glycol (MIRALAX / GLYCOLAX) packet Take 17 g by mouth daily. (Patient not taking: Reported on 04/24/2022) 30 packet 1   No current facility-administered medications for this visit.     Past Surgical History:  Procedure Laterality Date   ABDOMINAL AORTAGRAM N/A 02/23/2014   Procedure: ABDOMINAL AORTAGRAM;  Surgeon: Conrad New Roads, MD;  Location: [facility] CATH LAB;  Service: Cardiovascular;  Laterality: N/A;   CATARACT EXTRACTION, BILATERAL     COLONOSCOPY     DEBRIDEMENT TENNIS ELBOW Right    ESOPHAGOGASTRODUODENOSCOPY     MESENTERIC ARTERY BYPASS N/A 03/10/2014   Procedure: AORTO TO SUPERIOR MESENTERIC ARTERY BYPASS;  Surgeon: [name], MD;  Location: [facility] OR;  Service: Vascular;  Laterality: N/A;   TONSILLECTOMY AND ADENOIDECTOMY     vascualr surgery     illiac stents     No Known Allergies    Family History  Problem Relation Age of Onset   Hypertension Father    Coronary artery disease Father    Hyperlipidemia Father    Diabetes Father    Bladder Cancer Father    Cancer Father    Heart disease Father    Heart attack Father    Diabetes Mother    Heart disease Mother        before age 82   Hyperlipidemia Mother    Hypertension Mother    AAA (abdominal aortic aneurysm) Mother    Colon cancer Neg Hx    Throat cancer Neg Hx    Kidney disease Neg Hx    Liver disease Neg Hx      Social History Connor Bryant reports that he quit smoking about 13 years ago. His smoking use included cigarettes. He has never used smokeless tobacco. Connor Bryant reports current alcohol  use of about 12.0 standard drinks of alcohol per week.   Review of Systems CONSTITUTIONAL: No weight loss, fever, chills, weakness or fatigue.  HEENT: Eyes: No visual loss, blurred vision, double vision or yellow sclerae.No hearing loss, sneezing, congestion, runny nose or sore  throat.  SKIN: No rash or itching.  CARDIOVASCULAR: per hpi RESPIRATORY: No shortness of breath, cough or sputum.  GASTROINTESTINAL: No anorexia, nausea, vomiting or diarrhea. No abdominal pain or blood.  GENITOURINARY: No burning on urination, no polyuria NEUROLOGICAL: No headache, dizziness, syncope, paralysis, ataxia, numbness or tingling in the extremities. No change in bowel or bladder control.  MUSCULOSKELETAL: No muscle, back pain, joint pain or stiffness.  LYMPHATICS: No enlarged nodes. No history of splenectomy.  PSYCHIATRIC: No history of depression or anxiety.  ENDOCRINOLOGIC: No reports of sweating, cold or heat intolerance. No polyuria or polydipsia.  Marland Kitchen   Physical Examination Today's Vitals   10/03/22 1345  BP: 136/60  Pulse: 62  SpO2: 96%  Weight: 202 lb 12.8 oz (92 kg)  Height: '5\' 8"'$  (1.727 m)   Body mass index is 30.84 kg/m.  Gen: resting comfortably, no acute distress HEENT: no scleral icterus, pupils equal round and reactive, no palptable cervical adenopathy,  CV: RRR, no m/r/g no jvd Resp: Clear to auscultation bilaterally GI: abdomen is soft, non-tender, non-distended, normal bowel sounds, no hepatosplenomegaly MSK: extremities are warm, no edema.  Skin: warm, no rash Neuro:  no focal deficits Psych: appropriate affect   Diagnostic Studies  03/2022 carotid US Summary:  Right Carotid: Velocities in the right ICA are consistent with a 1-39%  stenosis.                Non-hemodynamically significant plaque <50% noted in the  CCA. The                 ECA appears >50% stenosed.   Left Carotid: Velocities in the left ICA are consistent with a 40-59%  stenosis.                Non-hemodynamically significant plaque <50% noted in the  CCA. The                ECA appears >50% stenosed. Based on peak velocities,  turbulent               flow and plaque formation.   Vertebrals:  Bilateral vertebral arteries demonstrate antegrade flow.  Subclavians: Right subclavian artery was stenotic. Normal flow  hemodynamics were               seen in the left subclavian artery.     Assessment and Plan   1. History of cardiomyopathy - LVEF has normalized, likely stress induced CM - no specific cardiac symptoms, continue to monitor   2.  HTN - white coat HTN history - bp's at goal, continue currentmeds  3 Afib/Acquired thrombophilia - no significant symptoms, continue current meds including eliquis for stroke prevention.        4. Aortic stenosis - mild to moderate by last echo, - we will contineu to monitor,  5. Carotid stenosis - mild to moderate disease, repeat US in summer  6. Hyperlipidemia - at goal, continue current meds       Arnoldo Lenis, M.D.

## 2022-10-04 DIAGNOSIS — H35033 Hypertensive retinopathy, bilateral: Secondary | ICD-10-CM | POA: Diagnosis not present

## 2022-10-04 DIAGNOSIS — H43813 Vitreous degeneration, bilateral: Secondary | ICD-10-CM | POA: Diagnosis not present

## 2022-10-04 DIAGNOSIS — E119 Type 2 diabetes mellitus without complications: Secondary | ICD-10-CM | POA: Diagnosis not present

## 2022-10-24 ENCOUNTER — Other Ambulatory Visit: Payer: Self-pay | Admitting: Cardiology

## 2022-10-25 ENCOUNTER — Encounter: Payer: Medicare Other | Admitting: Family Medicine

## 2022-11-03 ENCOUNTER — Other Ambulatory Visit: Payer: Self-pay | Admitting: Family Medicine

## 2022-11-05 ENCOUNTER — Other Ambulatory Visit: Payer: Self-pay | Admitting: Family Medicine

## 2022-11-20 ENCOUNTER — Ambulatory Visit (INDEPENDENT_AMBULATORY_CARE_PROVIDER_SITE_OTHER): Payer: Medicare Other | Admitting: Family Medicine

## 2022-11-20 ENCOUNTER — Encounter: Payer: Self-pay | Admitting: Family Medicine

## 2022-11-20 VITALS — BP 137/53 | HR 63 | Temp 97.9°F | Ht 68.0 in | Wt 201.2 lb

## 2022-11-20 DIAGNOSIS — I10 Essential (primary) hypertension: Secondary | ICD-10-CM | POA: Diagnosis not present

## 2022-11-20 DIAGNOSIS — E1169 Type 2 diabetes mellitus with other specified complication: Secondary | ICD-10-CM | POA: Diagnosis not present

## 2022-11-20 DIAGNOSIS — E7800 Pure hypercholesterolemia, unspecified: Secondary | ICD-10-CM | POA: Diagnosis not present

## 2022-11-20 DIAGNOSIS — N189 Chronic kidney disease, unspecified: Secondary | ICD-10-CM

## 2022-11-20 DIAGNOSIS — E1122 Type 2 diabetes mellitus with diabetic chronic kidney disease: Secondary | ICD-10-CM

## 2022-11-20 DIAGNOSIS — E756 Lipid storage disorder, unspecified: Secondary | ICD-10-CM | POA: Diagnosis not present

## 2022-11-20 LAB — BAYER DCA HB A1C WAIVED: HB A1C (BAYER DCA - WAIVED): 6.6 % — ABNORMAL HIGH (ref 4.8–5.6)

## 2022-11-20 MED ORDER — LISINOPRIL 20 MG PO TABS: 20.0000 mg | ORAL_TABLET | Freq: Every day | ORAL | 3 refills | Status: DC

## 2022-11-20 MED ORDER — APIXABAN 5 MG PO TABS: 5.0000 mg | ORAL_TABLET | Freq: Two times a day (BID) | ORAL | 3 refills | Status: DC

## 2022-11-20 MED ORDER — GLIMEPIRIDE 2 MG PO TABS
ORAL_TABLET | ORAL | 3 refills | Status: DC
Start: 2022-11-20 — End: 2023-10-15

## 2022-11-20 MED ORDER — AMLODIPINE BESYLATE 5 MG PO TABS
5.0000 mg | ORAL_TABLET | Freq: Every day | ORAL | 3 refills | Status: DC
Start: 2022-11-20 — End: 2023-08-23

## 2022-11-20 MED ORDER — HYDRALAZINE HCL 100 MG PO TABS: 100.0000 mg | ORAL_TABLET | Freq: Three times a day (TID) | ORAL | 3 refills | Status: DC

## 2022-11-20 MED ORDER — ATORVASTATIN CALCIUM 80 MG PO TABS: 80.0000 mg | ORAL_TABLET | Freq: Every day | ORAL | 3 refills | Status: DC

## 2022-11-20 NOTE — Progress Notes (Signed)
Subjective:  Patient ID: Connor Bryant,  male    DOB: 11/01/1945  Age: 77 y.o.    CC: Medical Management of Chronic Issues   HPI Connor Bryant presents for  follow-up of hypertension. Patient has no history of headache chest pain or shortness of breath or recent cough. Patient also denies symptoms of TIA such as numbness weakness lateralizing. Patient denies side effects from medication. States taking it regularly.  Patient also  in for follow-up of elevated cholesterol. Doing well without complaints on current medication. Denies side effects  including myalgia and arthralgia and nausea. Also in today for liver function testing. Currently no chest pain, shortness of breath or other cardiovascular related symptoms noted.  Follow-up of diabetes. Patient does not check blood sugar at home. Eating Oreos occasionally Patient denies symptoms such as excessive hunger or urinary frequency, excessive hunger, nausea No significant hypoglycemic spells noted. Medications reviewed. Pt reports taking them regularly. Pt. denies complication/adverse reaction today.    History Connor Bryant has a past medical history of Acute CVA (cerebrovascular accident) (Connor Bryant) (05/01/2020), Acute respiratory failure with hypoxia (Connor Bryant) (05/04/2020), Arthritis, DM type 2 (diabetes mellitus, type 2) (Connor Bryant), Endotracheally intubated, Gallstone, Heart murmur, HTN (hypertension), Hyperlipidemia, Personal history of colonic polyp-adenoma (11/24/2013), Renal insufficiency, and Thoracic spine fracture (Connor Bryant).   He has a past surgical history that includes Debridement tennis elbow (Right); Tonsillectomy and adenoidectomy; Colonoscopy; Esophagogastroduodenoscopy; Mesenteric artery bypass (N/A, 03/10/2014); abdominal aortagram (N/A, 02/23/2014); vascualr surgery; and Cataract extraction, bilateral.   His family history includes AAA (abdominal aortic aneurysm) in his mother; Bladder Cancer in his father; Cancer in his father; Coronary artery  disease in his father; Diabetes in his father and mother; Heart attack in his father; Heart disease in his father and mother; Hyperlipidemia in his father and mother; Hypertension in his father and mother.He reports that he quit smoking about 13 years ago. His smoking use included cigarettes. He has never used smokeless tobacco. He reports current alcohol use of about 12.0 standard drinks of alcohol per week. He reports that he does not currently use drugs.  Current Outpatient Medications on File Prior to Visit  Medication Sig Dispense Refill   allopurinol (ZYLOPRIM) 100 MG tablet TAKE 1 TABLET 2 TO 3 TIMES A DAY FOR GOUT 90 tablet 1   carvedilol (COREG) 25 MG tablet TAKE ONE TABLET TWICE DAILY 180 tablet 2   chlorthalidone (HYGROTON) 25 MG tablet Take 25 mg by mouth every morning.     fluticasone (FLONASE) 50 MCG/ACT nasal spray Place 2 sprays into both nostrils daily. 16 g 6   isosorbide mononitrate (IMDUR) 30 MG 24 hr tablet Take 1 tablet (30 mg total) by mouth daily. 90 tablet 3   levocetirizine (XYZAL) 5 MG tablet Take 1 tablet every evening. 90 tablet 1   polyethylene glycol (MIRALAX / GLYCOLAX) packet Take 17 g by mouth daily. 30 packet 1   No current facility-administered medications on file prior to visit.    ROS Review of Systems  Constitutional:  Negative for fever.  Respiratory:  Negative for shortness of breath.   Cardiovascular:  Negative for chest pain.  Musculoskeletal:  Negative for arthralgias.  Skin:  Negative for rash.    Objective:  BP (!) 137/53   Pulse 63   Temp 97.9 F (36.6 C)   Ht '5\' 8"'$  (1.727 m)   Wt 201 lb 3.2 oz (91.3 kg)   SpO2 96%   BMI 30.59 kg/m   BP Readings from Last 3 Encounters:  11/20/22 (!) 137/53  10/03/22 136/60  06/05/22 138/70    Wt Readings from Last 3 Encounters:  11/20/22 201 lb 3.2 oz (91.3 kg)  10/03/22 202 lb 12.8 oz (92 kg)  06/05/22 203 lb 6.4 oz (92.3 kg)     Physical Exam Vitals reviewed.  Constitutional:       Appearance: He is well-developed.  HENT:     Head: Normocephalic and atraumatic.     Right Ear: External ear normal.     Left Ear: External ear normal.     Mouth/Throat:     Pharynx: No oropharyngeal exudate or posterior oropharyngeal erythema.  Eyes:     Pupils: Pupils are equal, round, and reactive to light.  Cardiovascular:     Rate and Rhythm: Normal rate and regular rhythm.     Heart sounds: No murmur heard. Pulmonary:     Effort: No respiratory distress.     Breath sounds: Normal breath sounds.  Musculoskeletal:     Cervical back: Normal range of motion and neck supple.  Neurological:     Mental Status: He is alert and oriented to person, place, and time.     Diabetic Foot Exam - Simple   No data filed     Lab Results  Component Value Date   HGBA1C 6.6 (H) 11/20/2022   HGBA1C 5.8 (H) 04/24/2022   HGBA1C 6.3 (H) 01/22/2022    Assessment & Plan:   Connor Bryant was seen today for medical management of chronic issues.  Diagnoses and all orders for this visit:  Type 2 diabetes mellitus with chronic kidney disease, without long-term current use of insulin, unspecified CKD stage (HCC) -     Bayer DCA Hb A1c Waived -     Cancel: Microalbumin / creatinine urine ratio -     atorvastatin (LIPITOR) 80 MG tablet; Take 1 tablet (80 mg total) by mouth daily. -     Cancel: Microalbumin / creatinine urine ratio  Essential hypertension -     CBC with Differential/Platelet -     CMP14+EGFR -     hydrALAZINE (APRESOLINE) 100 MG tablet; Take 1 tablet (100 mg total) by mouth 3 (three) times daily.  Pure hypercholesterolemia -     Lipid panel  Diabetic lipidosis (HCC) -     atorvastatin (LIPITOR) 80 MG tablet; Take 1 tablet (80 mg total) by mouth daily.  Other orders -     amLODipine (NORVASC) 5 MG tablet; Take 1 tablet (5 mg total) by mouth daily. For blood pressure -     apixaban (ELIQUIS) 5 MG TABS tablet; Take 1 tablet (5 mg total) by mouth 2 (two) times daily. -      glimepiride (AMARYL) 2 MG tablet; TAKE ONE TABLET ONCE DAILY EVERY MORNING -     lisinopril (ZESTRIL) 20 MG tablet; Take 1 tablet (20 mg total) by mouth daily.   I have changed Connor Bryant "Connor Bryant"'s Eliquis to apixaban. I am also having him maintain his polyethylene glycol, fluticasone, isosorbide mononitrate, chlorthalidone, levocetirizine, allopurinol, carvedilol, amLODipine, atorvastatin, glimepiride, hydrALAZINE, and lisinopril.  Meds ordered this encounter  Medications   amLODipine (NORVASC) 5 MG tablet    Sig: Take 1 tablet (5 mg total) by mouth daily. For blood pressure    Dispense:  90 tablet    Refill:  3   apixaban (ELIQUIS) 5 MG TABS tablet    Sig: Take 1 tablet (5 mg total) by mouth 2 (two) times daily.    Dispense:  180 tablet  Refill:  3   atorvastatin (LIPITOR) 80 MG tablet    Sig: Take 1 tablet (80 mg total) by mouth daily.    Dispense:  90 tablet    Refill:  3   glimepiride (AMARYL) 2 MG tablet    Sig: TAKE ONE TABLET ONCE DAILY EVERY MORNING    Dispense:  90 tablet    Refill:  3   hydrALAZINE (APRESOLINE) 100 MG tablet    Sig: Take 1 tablet (100 mg total) by mouth 3 (three) times daily.    Dispense:  270 tablet    Refill:  3   lisinopril (ZESTRIL) 20 MG tablet    Sig: Take 1 tablet (20 mg total) by mouth daily.    Dispense:  90 tablet    Refill:  3     Follow-up: Return in about 3 months (around 02/18/2023).  Claretta Fraise, M.D.

## 2022-11-21 LAB — CBC WITH DIFFERENTIAL/PLATELET
Basophils Absolute: 0 10*3/uL (ref 0.0–0.2)
Basos: 1 %
EOS (ABSOLUTE): 0.4 10*3/uL (ref 0.0–0.4)
Eos: 6 %
Hematocrit: 37.4 % — ABNORMAL LOW (ref 37.5–51.0)
Hemoglobin: 12.5 g/dL — ABNORMAL LOW (ref 13.0–17.7)
Immature Grans (Abs): 0 10*3/uL (ref 0.0–0.1)
Immature Granulocytes: 0 %
Lymphocytes Absolute: 1.6 10*3/uL (ref 0.7–3.1)
Lymphs: 26 %
MCH: 30.5 pg (ref 26.6–33.0)
MCHC: 33.4 g/dL (ref 31.5–35.7)
MCV: 91 fL (ref 79–97)
Monocytes Absolute: 0.6 10*3/uL (ref 0.1–0.9)
Monocytes: 9 %
Neutrophils Absolute: 3.7 10*3/uL (ref 1.4–7.0)
Neutrophils: 58 %
Platelets: 206 10*3/uL (ref 150–450)
RBC: 4.1 x10E6/uL — ABNORMAL LOW (ref 4.14–5.80)
RDW: 13.8 % (ref 11.6–15.4)
WBC: 6.4 10*3/uL (ref 3.4–10.8)

## 2022-11-21 LAB — LIPID PANEL
Chol/HDL Ratio: 1.9 ratio (ref 0.0–5.0)
Cholesterol, Total: 152 mg/dL (ref 100–199)
HDL: 79 mg/dL (ref >39)
LDL Chol Calc (NIH): 56 mg/dL (ref 0–99)
Triglycerides: 94 mg/dL (ref 0–149)
VLDL Cholesterol Cal: 17 mg/dL (ref 5–40)

## 2022-11-21 LAB — CMP14+EGFR
ALT: 21 IU/L (ref 0–44)
AST: 20 IU/L (ref 0–40)
Albumin/Globulin Ratio: 2 (ref 1.2–2.2)
Albumin: 4.4 g/dL (ref 3.8–4.8)
Alkaline Phosphatase: 98 IU/L (ref 44–121)
BUN/Creatinine Ratio: 19 (ref 10–24)
BUN: 28 mg/dL — ABNORMAL HIGH (ref 8–27)
Bilirubin Total: 0.4 mg/dL (ref 0.0–1.2)
CO2: 23 mmol/L (ref 20–29)
Calcium: 9.3 mg/dL (ref 8.6–10.2)
Chloride: 107 mmol/L — ABNORMAL HIGH (ref 96–106)
Creatinine, Ser: 1.45 mg/dL — ABNORMAL HIGH (ref 0.76–1.27)
Globulin, Total: 2.2 g/dL (ref 1.5–4.5)
Glucose: 87 mg/dL (ref 70–99)
Potassium: 4.3 mmol/L (ref 3.5–5.2)
Sodium: 144 mmol/L (ref 134–144)
Total Protein: 6.6 g/dL (ref 6.0–8.5)
eGFR: 50 mL/min/{1.73_m2} — ABNORMAL LOW (ref >59)

## 2022-11-21 NOTE — Progress Notes (Signed)
Hello Ryunosuke,  Your lab result is normal and/or stable.Some minor variations that are not significant are commonly marked abnormal, but do not represent any medical problem for you.  Best regards, Claretta Fraise, M.D.

## 2022-11-28 ENCOUNTER — Other Ambulatory Visit: Payer: Self-pay | Admitting: Cardiology

## 2022-12-03 ENCOUNTER — Ambulatory Visit (INDEPENDENT_AMBULATORY_CARE_PROVIDER_SITE_OTHER): Payer: Medicare Other

## 2022-12-03 ENCOUNTER — Other Ambulatory Visit: Payer: Self-pay | Admitting: Family Medicine

## 2022-12-03 VITALS — Ht 68.0 in | Wt 202.0 lb

## 2022-12-03 DIAGNOSIS — Z0000 Encounter for general adult medical examination without abnormal findings: Secondary | ICD-10-CM

## 2022-12-03 DIAGNOSIS — M1A9XX0 Chronic gout, unspecified, without tophus (tophi): Secondary | ICD-10-CM

## 2022-12-03 NOTE — Progress Notes (Signed)
Subjective:   Connor Bryant is a 77 y.o. male who presents for Medicare Annual/Subsequent preventive examination. I connected with  Arlana Pouch on 12/03/22 by a audio enabled telemedicine application and verified that I am speaking with the correct person using two identifiers.  Patient Location: Home  Provider Location: Home Office  I discussed the limitations of evaluation and management by telemedicine. The patient expressed understanding and agreed to proceed.  Review of Systems     Cardiac Risk Factors include: advanced age (>17mn, >>3women);male gender;hypertension;diabetes mellitus     Objective:    Today's Vitals   12/03/22 1452  Weight: 202 lb (91.6 kg)  Height: '5\' 8"'$  (1.727 m)   Body mass index is 30.71 kg/m.     12/03/2022    2:56 PM 05/29/2022    9:46 AM 11/27/2021    3:05 PM 05/02/2020   11:41 AM 05/01/2020    4:43 PM 10/01/2019    9:03 PM 06/02/2019    9:18 AM  Advanced Directives  Does Patient Have a Medical Advance Directive? Yes No No No No No No  Type of AParamedicof APingreeLiving will        Copy of [place]unction[place] Chart? No - copy requested        Would patient like information on creating a medical advance directive?  No - Patient declined No - Patient declined No - Patient declined No - Patient declined  No - Patient declined    Current Medications (verified) Outpatient Encounter Medications as of 12/03/2022  Medication Sig   allopurinol (ZYLOPRIM) 100 MG tablet TAKE 1 TABLET 2 TO 3 TIMES A DAY FOR GOUT   amLODipine (NORVASC) 5 MG tablet Take 1 tablet (5 mg total) by mouth daily. For blood pressure   apixaban (ELIQUIS) 5 MG TABS tablet Take 1 tablet (5 mg total) by mouth 2 (two) times daily.   atorvastatin (LIPITOR) 80 MG tablet Take 1 tablet (80 mg total) by mouth daily.   carvedilol (COREG) 25 MG tablet TAKE ONE TABLET TWICE DAILY   chlorthalidone (HYGROTON) 25 MG tablet Take 25 mg by mouth every morning.    fluticasone (FLONASE) 50 MCG/ACT nasal spray Place 2 sprays into both nostrils daily.   glimepiride (AMARYL) 2 MG tablet TAKE ONE TABLET ONCE DAILY EVERY MORNING   hydrALAZINE (APRESOLINE) 100 MG tablet Take 1 tablet (100 mg total) by mouth 3 (three) times daily.   isosorbide mononitrate (IMDUR) 30 MG 24 hr tablet TAKE ONE TABLET BY MOUTH DAILY   levocetirizine (XYZAL) 5 MG tablet Take 1 tablet every evening.   lisinopril (ZESTRIL) 20 MG tablet Take 1 tablet (20 mg total) by mouth daily.   polyethylene glycol (MIRALAX / GLYCOLAX) packet Take 17 g by mouth daily.   No facility-administered encounter medications on file as of 12/03/2022.    Allergies (verified) Patient has no known allergies.   History: Past Medical History:  Diagnosis Date   Acute CVA (cerebrovascular accident) ([facility] [address] 05/01/2020   Acute respiratory failure with hypoxia ([facility] 05/04/2020   Arthritis    DM type 2 (diabetes mellitus, type 2) ([facility]    no meds   Endotracheally intubated    Gallstone    Heart murmur    was told one time he had a murmur, not since 30 years ago   HTN (hypertension)    Hyperlipidemia    Personal history of colonic polyp-adenoma 11/24/2013   Renal insufficiency    mild  Thoracic spine fracture [facility])    Past Surgical History:  Procedure Laterality Date   ABDOMINAL AORTAGRAM N/A 02/23/2014   Procedure: ABDOMINAL AORTAGRAM;  Surgeon: Conrad Dahlgren, MD;  Location: [facility] CATH LAB;  Service: Cardiovascular;  Laterality: N/A;   CATARACT EXTRACTION, BILATERAL     COLONOSCOPY     DEBRIDEMENT TENNIS ELBOW Right    ESOPHAGOGASTRODUODENOSCOPY     MESENTERIC ARTERY BYPASS N/A 03/10/2014   Procedure: AORTO TO SUPERIOR MESENTERIC ARTERY BYPASS;  Surgeon: [name], MD;  Location: [facility] OR;  Service: Vascular;  Laterality: N/A;   TONSILLECTOMY AND ADENOIDECTOMY     vascualr surgery     illiac stents   Family History  Problem Relation Age of Onset   Hypertension Father    Coronary artery disease  Father    Hyperlipidemia Father    Diabetes Father    Bladder Cancer Father    Cancer Father    Heart disease Father    Heart attack Father    Diabetes Mother    Heart disease Mother        before age 68   Hyperlipidemia Mother    Hypertension Mother    AAA (abdominal aortic aneurysm) Mother    Colon cancer Neg Hx    Throat cancer Neg Hx    Kidney disease Neg Hx    Liver disease Neg Hx    Social History   Socioeconomic History   Marital status: Married    Spouse name: Not on file   Number of children: Not on file   Years of education: Not on file   Highest education level: Not on file  Occupational History   Not on file  Tobacco Use   Smoking status: Former    Types: Cigarettes    Quit date: 07/08/2009    Years since quitting: 13.4   Smokeless tobacco: Never  Vaping Use   Vaping Use: Never used  Substance and Sexual Activity   Alcohol use: Yes    Alcohol/week: 12.0 standard drinks of alcohol    Types: 12 Cans of beer per week    Comment: 8 beers a week   Drug use: Not Currently   Sexual activity: Yes  Other Topics Concern   Not on file  Social History Narrative   Patient is married, he is retired. 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   Social Determinants of Health   Financial Resource Strain: Low Risk  (12/03/2022)   Overall Financial Resource Strain (CARDIA)    Difficulty of Paying Living Expenses: Not hard at all  Food Insecurity: No Food Insecurity (12/03/2022)   Hunger Vital Sign    Worried About Running Out of Food in the Last Year: Never true    [place] in the Last Year: Never true  Transportation Needs: No Transportation Needs (12/03/2022)   PRAPARE - Hydrologist (Medical): No    Lack of Transportation (Non-Medical): No  Physical Activity: Insufficiently Active (12/03/2022)   Exercise Vital Sign    Days of Exercise per Week: 3 days    Minutes of Exercise per Session: 30 min  Stress: No Stress  Concern Present (12/03/2022)   [place]    Feeling of Stress : Not at all  Social Connections: Moderately Isolated (12/03/2022)   Social Connection and Isolation Panel [NHANES]    Frequency of Communication with Friends and Family: More than three times a  week    Frequency of Social Gatherings with Friends and Family: More than three times a week    Attends Religious Services: Never    Marine scientist or Organizations: No    Attends Music therapist: Never    Marital Status: Married    Tobacco Counseling Counseling given: Not Answered   Clinical Intake:     Pain : No/denies pain     Nutritional Risks: None Diabetes: Yes CBG done?: No Did pt. bring in CBG monitor from home?: No  How often do you need to have someone help you when you read instructions, pamphlets, or other written materials from your doctor or pharmacy?: 1 - Never  Diabetic?yes  Nutrition Risk Assessment:  Has the patient had any N/V/D within the last 2 months?  No  Does the patient have any non-healing wounds?  No  Has the patient had any unintentional weight loss or weight gain?  No   Diabetes:  Is the patient diabetic?  Yes  If diabetic, was a CBG obtained today?  No  Did the patient bring in their glucometer from home?  No  How often do you monitor your CBG's? Never .   Financial Strains and Diabetes Management:  Are you having any financial strains with the device, your supplies or your medication? No .  Does the patient want to be seen by Chronic Care Management for management of their diabetes?  No  Would the patient like to be referred to a Nutritionist or for Diabetic Management?  No   Diabetic Exams:  Diabetic Eye Exam: Completed 09/2022 Diabetic Foot Exam: Overdue, Pt has been advised about the importance in completing this exam. Pt is scheduled for diabetic foot exam on next office visit .    Interpreter Needed?: No  Information entered by :: Jadene Pierini, LPN   Activities of Daily Living    12/03/2022    2:56 PM  In your present state of health, do you have any difficulty performing the following activities:  Hearing? 0  Vision? 0  Difficulty concentrating or making decisions? 0  Walking or climbing stairs? 0  Dressing or bathing? 0  Doing errands, shopping? 0  Preparing Food and eating ? N  Using the Toilet? N  In the past six months, have you accidently leaked urine? N  Do you have problems with loss of bowel control? N  Managing your Medications? N  Managing your Finances? N  Housekeeping or managing your Housekeeping? N    Patient Care Team: Claretta Fraise, MD as PCP - General (Family Medicine) Harl Bowie Alphonse Guild, MD as PCP - Cardiology (Cardiology) Rexene Agent, MD as Attending Physician (Nephrology)  Indicate any recent Medical Services you may have received from other than [facility] providers in the past year (date may be approximate).     Assessment:   This is a routine wellness examination for [employer].  Hearing/Vision screen Vision Screening - Comments:: Wears rx glasses - up to date with routine eye exams with  Dr.johnson   Dietary issues and exercise activities discussed: Current Exercise Habits: Home exercise routine, Type of exercise: walking, Time (Minutes): 30, Frequency (Times/Week): 3, Weekly Exercise (Minutes/Week): 90, Intensity: Mild, Exercise limited by: None identified   Goals Addressed             This Visit's Progress    Exercise 3x per week (30 min per time)   On track    Continue to exercise and stay healthy.  Depression Screen    12/03/2022    2:55 PM 11/20/2022    1:26 PM 06/05/2022    1:42 PM 04/24/2022    1:33 PM 01/22/2022    2:51 PM 11/27/2021    2:57 PM 10/24/2021    2:57 PM  PHQ 2/9 Scores  PHQ - 2 Score 0 0 0 0 0 0 0    Fall Risk    12/03/2022    2:54 PM 11/20/2022    1:26 PM 06/05/2022    1:42 PM  04/24/2022    1:33 PM 01/22/2022    2:51 PM  [place] in the past year? 0 0 0 0 0  Number falls in past yr: 0      Injury with Fall? 0      Risk for fall due to : No Fall Risks      Follow up Falls prevention discussed        FALL RISK PREVENTION PERTAINING TO THE HOME:  Any stairs in or around the home? No  If so, are there any without handrails? No  Home free of loose throw rugs in walkways, pet beds, electrical cords, etc? Yes  Adequate lighting in your home to reduce risk of falls? Yes   ASSISTIVE DEVICES UTILIZED TO PREVENT FALLS:  Life alert? No  Use of a cane, walker or w/c? No  Grab bars in the bathroom? Yes  Shower chair or bench in shower? No  Elevated toilet seat or a handicapped toilet? No        12/03/2022    2:56 PM 11/27/2021    3:11 PM  6CIT Screen  What Year? 0 points 0 points  What month? 0 points 0 points  What time? 0 points 0 points  Count back from 20 0 points 0 points  Months in reverse 0 points 0 points  Repeat phrase 2 points 2 points  Total Score 2 points 2 points    Immunizations Immunization History  Administered Date(s) Administered   Fluad Quad(high Dose 65+) 08/27/2019, 08/30/2020, 06/14/2021   Influenza Split 07/06/2016, 06/24/2017, 08/26/2018   Influenza, High Dose Seasonal PF 07/06/2016, 06/24/2017, 08/26/2018, 08/27/2019    TDAP status: Due, Education has been provided regarding the importance of this vaccine. Advised may receive this vaccine at local pharmacy or Health Dept. Aware to provide a copy of the vaccination record if obtained from local pharmacy or Health Dept. Verbalized acceptance and understanding.  Flu Vaccine status: Declined, Education has been provided regarding the importance of this vaccine but patient still declined. Advised may receive this vaccine at local pharmacy or Health Dept. Aware to provide a copy of the vaccination record if obtained from local pharmacy or Health Dept. Verbalized acceptance and  understanding.  Pneumococcal vaccine status: Up to date  Covid-19 vaccine status: Completed vaccines  Qualifies for Shingles Vaccine? Yes   Zostavax completed No   Shingrix Completed?: No.    Education has been provided regarding the importance of this vaccine. Patient has been advised to call insurance company to determine out of pocket expense if they have not yet received this vaccine. Advised may also receive vaccine at local pharmacy or Health Dept. Verbalized acceptance and understanding.  Screening Tests Health Maintenance  Topic Date Due   Pneumonia Vaccine 34+ Years old (1 of 2 - PCV) Never done   DTaP/Tdap/Td (1 - Tdap) Never done   COLONOSCOPY (Pts 45-56yr Insurance coverage will need to be confirmed)  11/17/2018   Diabetic kidney  evaluation - Urine ACR  02/28/2021   Lung Cancer Screening  05/08/2021   OPHTHALMOLOGY EXAM  09/01/2022   COVID-19 Vaccine (1) 12/06/2022 (Originally 06/13/1946)   INFLUENZA VACCINE  12/23/2022 (Originally 04/24/2022)   Zoster Vaccines- Shingrix (1 of 2) 02/18/2023 (Originally [birth year]/12/1994)   HEMOGLOBIN A1C  05/21/2023   Diabetic kidney evaluation - eGFR measurement  11/21/2023   FOOT EXAM  11/21/2023   Medicare Annual Wellness (AWV)  12/03/2023   Hepatitis C Screening  Completed   HPV VACCINES  Aged Out    Health Maintenance  Health Maintenance Due  Topic Date Due   Pneumonia Vaccine 27+ Years old (1 of 2 - PCV) Never done   DTaP/Tdap/Td (1 - Tdap) Never done   COLONOSCOPY (Pts 45-31yr Insurance coverage will need to be confirmed)  11/17/2018   Diabetic kidney evaluation - Urine ACR  02/28/2021   Lung Cancer Screening  05/08/2021   OPHTHALMOLOGY EXAM  09/01/2022    Colorectal cancer screening: No longer required.   Lung Cancer Screening: (Low Dose CT Chest recommended if Age 78-80years, 30 pack-year currently smoking OR have quit w/in 15years.) does not qualify.   Lung Cancer Screening Referral: n/a  Additional  Screening:  Hepatitis C Screening: does not qualify;   Vision Screening: Recommended annual ophthalmology exams for early detection of glaucoma and other disorders of the eye. Is the patient up to date with their annual eye exam?  Yes  Who is the provider or what is the name of the office in which the patient attends annual eye exams? Dr.Stacks  If pt is not established with a provider, would they like to be referred to a provider to establish care? No .   Dental Screening: Recommended annual dental exams for proper oral hygiene  Community Resource Referral / Chronic Care Management: CRR required this visit?  No   CCM required this visit?  No      Plan:     I have personally reviewed and noted the following in the patient's chart:   Medical and social history Use of alcohol, tobacco or illicit drugs  Current medications and supplements including opioid prescriptions. Patient is not currently taking opioid prescriptions. Functional ability and status Nutritional status Physical activity Advanced directives List of other physicians Hospitalizations, surgeries, and ER visits in previous 12 months Vitals Screenings to include cognitive, depression, and falls Referrals and appointments  In addition, I have reviewed and discussed with patient certain preventive protocols, quality metrics, and best practice recommendations. A written personalized care plan for preventive services as well as general preventive health recommendations were provided to patient.     LDaphane Shepherd LPN   [id number]  Nurse Notes: Due TDAP Vaccine

## 2022-12-03 NOTE — Patient Instructions (Signed)
Mr. Connor Bryant , Thank you for taking time to come for your Medicare Wellness Visit. I appreciate your ongoing commitment to your health goals. Please review the following plan we discussed and let me know if I can assist you in the future.   These are the goals we discussed:  Goals      Exercise 3x per week (30 min per time)     Continue to exercise and stay healthy.        This is a list of the screening recommended for you and due dates:  Health Maintenance  Topic Date Due   Pneumonia Vaccine (1 of 2 - PCV) Never done   DTaP/Tdap/Td vaccine (1 - Tdap) Never done   Colon Cancer Screening  11/17/2018   Yearly kidney health urinalysis for diabetes  02/28/2021   Screening for Lung Cancer  05/08/2021   Eye exam for diabetics  09/01/2022   COVID-19 Vaccine (1) 12/06/2022*   Flu Shot  12/23/2022*   Zoster (Shingles) Vaccine (1 of 2) 02/18/2023*   Hemoglobin A1C  05/21/2023   Yearly kidney function blood test for diabetes  11/21/2023   Complete foot exam   11/21/2023   Medicare Annual Wellness Visit  12/03/2023   Hepatitis C Screening: USPSTF Recommendation to screen - Ages 18-78 yo.  Completed   HPV Vaccine  Aged Out  *Topic was postponed. The date shown is not the original due date.    Advanced directives: Advance directive discussed with you today. I have provided a copy for you to complete at home and have notarized. Once this is complete please bring a copy in to our office so we can scan it into your chart.   Conditions/risks identified: Aim for 30 minutes of exercise or brisk walking, 6-8 glasses of water, and 5 servings of fruits and vegetables each day.   Next appointment: Follow up in one year for your annual wellness visit.   Preventive Care 23 Years and Older, Male  Preventive care refers to lifestyle choices and visits with your health care provider that can promote health and wellness. What does preventive care include? A yearly physical exam. This is also called an  annual well check. Dental exams once or twice a year. Routine eye exams. Ask your health care provider how often you should have your eyes checked. Personal lifestyle choices, including: Daily care of your teeth and gums. Regular physical activity. Eating a healthy diet. Avoiding tobacco and drug use. Limiting alcohol use. Practicing safe sex. Taking low doses of aspirin every day. Taking vitamin and mineral supplements as recommended by your health care provider. What happens during an annual well check? The services and screenings done by your health care provider during your annual well check will depend on your age, overall health, lifestyle risk factors, and family history of disease. Counseling  Your health care provider may ask you questions about your: Alcohol use. Tobacco use. Drug use. Emotional well-being. Home and relationship well-being. Sexual activity. Eating habits. History of falls. Memory and ability to understand (cognition). Work and work Statistician. Screening  You may have the following tests or measurements: Height, weight, and BMI. Blood pressure. Lipid and cholesterol levels. These may be checked every 5 years, or more frequently if you are over 10 years old. Skin check. Lung cancer screening. You may have this screening every year starting at age 58 if you have a 30-pack-year history of smoking and currently smoke or have quit within the past 15 years. Fecal occult  blood test (FOBT) of the stool. You may have this test every year starting at age 78. Flexible sigmoidoscopy or colonoscopy. You may have a sigmoidoscopy every 5 years or a colonoscopy every 10 years starting at age 45. Prostate cancer screening. Recommendations will vary depending on your family history and other risks. Hepatitis C blood test. Hepatitis B blood test. Sexually transmitted disease (STD) testing. Diabetes screening. This is done by checking your blood sugar (glucose) after you  have not eaten for a while (fasting). You may have this done every 1-3 years. Abdominal aortic aneurysm (AAA) screening. You may need this if you are a current or former smoker. Osteoporosis. You may be screened starting at age 33 if you are at high risk. Talk with your health care provider about your test results, treatment options, and if necessary, the need for more tests. Vaccines  Your health care provider may recommend certain vaccines, such as: Influenza vaccine. This is recommended every year. Tetanus, diphtheria, and acellular pertussis (Tdap, Td) vaccine. You may need a Td booster every 10 years. Zoster vaccine. You may need this after age 36. Pneumococcal 13-valent conjugate (PCV13) vaccine. One dose is recommended after age 17. Pneumococcal polysaccharide (PPSV23) vaccine. One dose is recommended after age 32. Talk to your health care provider about which screenings and vaccines you need and how often you need them. This information is not intended to replace advice given to you by your health care provider. Make sure you discuss any questions you have with your health care provider. Document Released: 10/07/2015 Document Revised: 05/30/2016 Document Reviewed: 07/12/2015 Elsevier Interactive Patient Education  2017 [place] Prevention in the Home Falls can cause injuries. They can happen to people of all ages. There are many things you can do to make your home safe and to help prevent falls. What can I do on the outside of my home? Regularly fix the edges of walkways and driveways and fix any cracks. Remove anything that might make you trip as you walk through a door, such as a raised step or threshold. Trim any bushes or trees on the path to your home. Use bright outdoor lighting. Clear any walking paths of anything that might make someone trip, such as rocks or tools. Regularly check to see if handrails are loose or broken. Make sure that both sides of any steps have  handrails. Any raised decks and porches should have guardrails on the edges. Have any leaves, snow, or ice cleared regularly. Use sand or salt on walking paths during winter. Clean up any spills in your garage right away. This includes oil or grease spills. What can I do in the bathroom? Use night lights. Install grab bars by the toilet and in the tub and shower. Do not use towel bars as grab bars. Use non-skid mats or decals in the tub or shower. If you need to sit down in the shower, use a plastic, non-slip stool. Keep the floor dry. Clean up any water that spills on the floor as soon as it happens. Remove soap buildup in the tub or shower regularly. Attach bath mats securely with double-sided non-slip rug tape. Do not have throw rugs and other things on the floor that can make you trip. What can I do in the bedroom? Use night lights. Make sure that you have a light by your bed that is easy to reach. Do not use any sheets or blankets that are too big for your bed. They should not  hang down onto the floor. Have a firm chair that has side arms. You can use this for support while you get dressed. Do not have throw rugs and other things on the floor that can make you trip. What can I do in the kitchen? Clean up any spills right away. Avoid walking on wet floors. Keep items that you use a lot in easy-to-reach places. If you need to reach something above you, use a strong step stool that has a grab bar. Keep electrical cords out of the way. Do not use floor polish or wax that makes floors slippery. If you must use wax, use non-skid floor wax. Do not have throw rugs and other things on the floor that can make you trip. What can I do with my stairs? Do not leave any items on the stairs. Make sure that there are handrails on both sides of the stairs and use them. Fix handrails that are broken or loose. Make sure that handrails are as long as the stairways. Check any carpeting to make sure  that it is firmly attached to the stairs. Fix any carpet that is loose or worn. Avoid having throw rugs at the top or bottom of the stairs. If you do have throw rugs, attach them to the floor with carpet tape. Make sure that you have a light switch at the top of the stairs and the bottom of the stairs. If you do not have them, ask someone to add them for you. What else can I do to help prevent falls? Wear shoes that: Do not have high heels. Have rubber bottoms. Are comfortable and fit you well. Are closed at the toe. Do not wear sandals. If you use a stepladder: Make sure that it is fully opened. Do not climb a closed stepladder. Make sure that both sides of the stepladder are locked into place. Ask someone to hold it for you, if possible. Clearly mark and make sure that you can see: Any grab bars or handrails. First and last steps. Where the edge of each step is. Use tools that help you move around (mobility aids) if they are needed. These include: Canes. Walkers. Scooters. Crutches. Turn on the lights when you go into a dark area. Replace any light bulbs as soon as they burn out. Set up your furniture so you have a clear path. Avoid moving your furniture around. If any of your floors are uneven, fix them. If there are any pets around you, be aware of where they are. Review your medicines with your doctor. Some medicines can make you feel dizzy. This can increase your chance of falling. Ask your doctor what other things that you can do to help prevent falls. This information is not intended to replace advice given to you by your health care provider. Make sure you discuss any questions you have with your health care provider. Document Released: 07/07/2009 Document Revised: 02/16/2016 Document Reviewed: 10/15/2014 Elsevier Interactive Patient Education  2017 [employer].

## 2022-12-18 ENCOUNTER — Other Ambulatory Visit: Payer: Self-pay | Admitting: Family Medicine

## 2023-01-09 DIAGNOSIS — N183 Chronic kidney disease, stage 3 unspecified: Secondary | ICD-10-CM | POA: Diagnosis not present

## 2023-01-15 DIAGNOSIS — I129 Hypertensive chronic kidney disease with stage 1 through stage 4 chronic kidney disease, or unspecified chronic kidney disease: Secondary | ICD-10-CM | POA: Diagnosis not present

## 2023-01-15 DIAGNOSIS — I639 Cerebral infarction, unspecified: Secondary | ICD-10-CM | POA: Diagnosis not present

## 2023-01-15 DIAGNOSIS — I5020 Unspecified systolic (congestive) heart failure: Secondary | ICD-10-CM | POA: Diagnosis not present

## 2023-01-15 DIAGNOSIS — E872 Acidosis, unspecified: Secondary | ICD-10-CM | POA: Diagnosis not present

## 2023-01-15 DIAGNOSIS — I2510 Atherosclerotic heart disease of native coronary artery without angina pectoris: Secondary | ICD-10-CM | POA: Diagnosis not present

## 2023-01-15 DIAGNOSIS — N183 Chronic kidney disease, stage 3 unspecified: Secondary | ICD-10-CM | POA: Diagnosis not present

## 2023-01-15 DIAGNOSIS — I739 Peripheral vascular disease, unspecified: Secondary | ICD-10-CM | POA: Diagnosis not present

## 2023-01-30 ENCOUNTER — Ambulatory Visit (INDEPENDENT_AMBULATORY_CARE_PROVIDER_SITE_OTHER): Payer: Medicare Other | Admitting: Family Medicine

## 2023-01-30 ENCOUNTER — Encounter: Payer: Self-pay | Admitting: Family Medicine

## 2023-01-30 VITALS — BP 122/63 | HR 77 | Temp 97.0°F | Ht 68.0 in | Wt 198.0 lb

## 2023-01-30 DIAGNOSIS — T63301A Toxic effect of unspecified spider venom, accidental (unintentional), initial encounter: Secondary | ICD-10-CM | POA: Diagnosis not present

## 2023-01-30 MED ORDER — BETAMETHASONE SOD PHOS & ACET 6 (3-3) MG/ML IJ SUSP
6.0000 mg | Freq: Once | INTRAMUSCULAR | Status: AC
  Administered 2023-01-30: 6 mg via INTRAMUSCULAR

## 2023-01-30 MED ORDER — DAPSONE 100 MG PO TABS: 100.0000 mg | ORAL_TABLET | Freq: Every day | ORAL | 0 refills | Status: DC

## 2023-02-01 ENCOUNTER — Encounter: Payer: Self-pay | Admitting: Family Medicine

## 2023-02-01 NOTE — Progress Notes (Signed)
Subjective:  Patient ID: Connor Bryant, male    DOB: May 31, 1946  Age: 77 y.o. MRN: [id number]  CC: HAND PAIN AND SWELLING (LEFT, WOKE UP IN PAIN, NO KNOWN INJURY)   HPI Connor Bryant presents for awakening this morning with pain and numbness in the left forearm. The numbness extends to the fingers. He also has a small puncture wound at the ventral forearm. It has a surrounding violaceous margin.     12/03/2022    2:55 PM 11/20/2022    1:26 PM 06/05/2022    1:42 PM  Depression screen PHQ 2/9  Decreased Interest 0 0 0  Down, Depressed, Hopeless 0 0 0  PHQ - 2 Score 0 0 0    History Connor Bryant has a past medical history of Acute CVA (cerebrovascular accident) (HCC) (05/01/2020), Acute respiratory failure with hypoxia (HCC) (05/04/2020), Arthritis, DM type 2 (diabetes mellitus, type 2) (HCC), Endotracheally intubated, Gallstone, Heart murmur, HTN (hypertension), Hyperlipidemia, Personal history of colonic polyp-adenoma (11/24/2013), Renal insufficiency, and Thoracic spine fracture (HCC).   He has a past surgical history that includes Debridement tennis elbow (Right); Tonsillectomy and adenoidectomy; Colonoscopy; Esophagogastroduodenoscopy; Mesenteric artery bypass (N/A, 03/10/2014); abdominal aortagram (N/A, 02/23/2014); vascualr surgery; and Cataract extraction, bilateral.   His family history includes AAA (abdominal aortic aneurysm) in his mother; Bladder Cancer in his father; Cancer in his father; Coronary artery disease in his father; Diabetes in his father and mother; Heart attack in his father; Heart disease in his father and mother; Hyperlipidemia in his father and mother; Hypertension in his father and mother.He reports that he quit smoking about 13 years ago. His smoking use included cigarettes. He has never used smokeless tobacco. He reports current alcohol use of about 12.0 standard drinks of alcohol per week. He reports that he does not currently use drugs.    ROS Review of  Systems  Objective:  BP 122/63   Pulse 77   Temp (!) 97 F (36.1 C)   Ht 5\' 8"  (1.727 m)   Wt 198 lb (89.8 kg)   SpO2 95%   BMI 30.11 kg/m   BP Readings from Last 3 Encounters:  01/30/23 122/63  11/20/22 (!) 137/53  10/03/22 136/60    Wt Readings from Last 3 Encounters:  01/30/23 198 lb (89.8 kg)  12/03/22 202 lb (91.6 kg)  11/20/22 201 lb 3.2 oz (91.3 kg)     Physical Exam Vitals reviewed.  Constitutional:      Appearance: He is well-developed.  HENT:     Head: Normocephalic and atraumatic.     Right Ear: External ear normal.     Left Ear: External ear normal.     Mouth/Throat:     Pharynx: No oropharyngeal exudate or posterior oropharyngeal erythema.  Eyes:     Pupils: Pupils are equal, round, and reactive to light.  Cardiovascular:     Rate and Rhythm: Normal rate and regular rhythm.     Heart sounds: No murmur heard. Pulmonary:     Effort: No respiratory distress.     Breath sounds: Normal breath sounds.  Musculoskeletal:     Cervical back: Normal range of motion and neck supple.  Skin:    Findings: Lesion (1 mm puncture mid left ventral forearm. Violaceous/ hematoma 1.3 cm surrounding it) present.  Neurological:     Mental Status: He is alert and oriented to person, place, and time.       Assessment & Plan:   Connor Bryant was seen today for hand pain  and swelling.  Diagnoses and all orders for this visit:  Spider bite wound, accidental or unintentional, initial encounter -     betamethasone acetate-betamethasone sodium phosphate (CELESTONE) injection 6 mg  Other orders -     dapsone 100 MG tablet; Take 1 tablet (100 mg total) by mouth daily.       I am having Connor Birch "[name]" start on dapsone. I am also having him maintain his polyethylene glycol, fluticasone, chlorthalidone, carvedilol, amLODipine, apixaban, atorvastatin, glimepiride, hydrALAZINE, lisinopril, isosorbide mononitrate, allopurinol, and levocetirizine. We administered  betamethasone acetate-betamethasone sodium phosphate.  Allergies as of 01/30/2023   No Known Allergies      Medication List        Accurate as of Jan 30, 2023 11:59 PM. If you have any questions, ask your nurse or doctor.          allopurinol 100 MG tablet Commonly known as: ZYLOPRIM TAKE 1 TABLET 2 TO 3 TIMES A DAY FOR GOUT   amLODipine 5 MG tablet Commonly known as: NORVASC Take 1 tablet (5 mg total) by mouth daily. For blood pressure   apixaban 5 MG Tabs tablet Commonly known as: Eliquis Take 1 tablet (5 mg total) by mouth 2 (two) times daily.   atorvastatin 80 MG tablet Commonly known as: LIPITOR Take 1 tablet (80 mg total) by mouth daily.   carvedilol 25 MG tablet Commonly known as: COREG TAKE ONE TABLET TWICE DAILY   chlorthalidone 25 MG tablet Commonly known as: HYGROTON Take 25 mg by mouth every morning.   dapsone 100 MG tablet Take 1 tablet (100 mg total) by mouth daily. Started by: Mechele Claude, MD   fluticasone 50 MCG/ACT nasal spray Commonly known as: FLONASE Place 2 sprays into both nostrils daily.   glimepiride 2 MG tablet Commonly known as: AMARYL TAKE ONE TABLET ONCE DAILY EVERY MORNING   hydrALAZINE 100 MG tablet Commonly known as: APRESOLINE Take 1 tablet (100 mg total) by mouth 3 (three) times daily.   isosorbide mononitrate 30 MG 24 hr tablet Commonly known as: IMDUR TAKE ONE TABLET BY MOUTH DAILY   levocetirizine 5 MG tablet Commonly known as: XYZAL TAKE ONE TABLET EVERY EVENING   lisinopril 20 MG tablet Commonly known as: ZESTRIL Take 1 tablet (20 mg total) by mouth daily.   polyethylene glycol 17 g packet Commonly known as: MIRALAX / GLYCOLAX Take 17 g by mouth daily.         Follow-up: Return if symptoms worsen or fail to improve.  Mechele Claude, M.D.

## 2023-02-19 ENCOUNTER — Ambulatory Visit (INDEPENDENT_AMBULATORY_CARE_PROVIDER_SITE_OTHER): Payer: Medicare Other | Admitting: Family Medicine

## 2023-02-19 ENCOUNTER — Encounter: Payer: Self-pay | Admitting: Family Medicine

## 2023-02-19 VITALS — BP 144/54 | HR 64 | Temp 97.2°F | Ht 68.0 in | Wt 197.0 lb

## 2023-02-19 DIAGNOSIS — M79602 Pain in left arm: Secondary | ICD-10-CM

## 2023-02-19 DIAGNOSIS — E1122 Type 2 diabetes mellitus with diabetic chronic kidney disease: Secondary | ICD-10-CM | POA: Diagnosis not present

## 2023-02-19 DIAGNOSIS — I10 Essential (primary) hypertension: Secondary | ICD-10-CM | POA: Diagnosis not present

## 2023-02-19 DIAGNOSIS — N189 Chronic kidney disease, unspecified: Secondary | ICD-10-CM | POA: Diagnosis not present

## 2023-02-19 DIAGNOSIS — Z7984 Long term (current) use of oral hypoglycemic drugs: Secondary | ICD-10-CM

## 2023-02-19 DIAGNOSIS — E7800 Pure hypercholesterolemia, unspecified: Secondary | ICD-10-CM | POA: Diagnosis not present

## 2023-02-19 LAB — BAYER DCA HB A1C WAIVED: HB A1C (BAYER DCA - WAIVED): 5.3 % (ref 4.8–5.6)

## 2023-02-19 MED ORDER — BETAMETHASONE SOD PHOS & ACET 6 (3-3) MG/ML IJ SUSP
6.0000 mg | Freq: Once | INTRAMUSCULAR | Status: AC
  Administered 2023-02-19: 6 mg via INTRAMUSCULAR

## 2023-02-19 NOTE — Progress Notes (Signed)
Subjective:  Patient ID: Connor Bryant, male    DOB: April 25, 1946  Age: 77 y.o. MRN: [id number]  CC: Medical Management of Chronic Issues   HPI CATHAL REITENBACH presents forFollow-up of diabetes. Patient does not check blood sugar at home.  Patient denies symptoms such as polyuria, polydipsia, excessive hunger, nausea No significant hypoglycemic spells noted. Medications reviewed. Pt reports taking them regularly without complication/adverse reaction being reported today.    presents for  follow-up of hypertension. Patient has no history of headache chest pain or shortness of breath or recent cough. Patient also denies symptoms of TIA such as focal numbness or weakness. Patient denies side effects from medication. States taking it regularly. Home BP running 100-124/ 55-63  History Cornel has a past medical history of Acute CVA (cerebrovascular accident) (HCC) (05/01/2020), Acute respiratory failure with hypoxia (HCC) (05/04/2020), Arthritis, DM type 2 (diabetes mellitus, type 2) (HCC), Endotracheally intubated, Gallstone, Heart murmur, HTN (hypertension), Hyperlipidemia, Personal history of colonic polyp-adenoma (11/24/2013), Renal insufficiency, and Thoracic spine fracture (HCC).   He has a past surgical history that includes Debridement tennis elbow (Right); Tonsillectomy and adenoidectomy; Colonoscopy; Esophagogastroduodenoscopy; Mesenteric artery bypass (N/A, 03/10/2014); abdominal aortagram (N/A, 02/23/2014); vascualr surgery; and Cataract extraction, bilateral.   His family history includes AAA (abdominal aortic aneurysm) in his mother; Bladder Cancer in his father; Cancer in his father; Coronary artery disease in his father; Diabetes in his father and mother; Heart attack in his father; Heart disease in his father and mother; Hyperlipidemia in his father and mother; Hypertension in his father and mother.He reports that he quit smoking about 13 years ago. His smoking use included cigarettes. He  has never used smokeless tobacco. He reports current alcohol use of about 12.0 standard drinks of alcohol per week. He reports that he does not currently use drugs.  Current Outpatient Medications on File Prior to Visit  Medication Sig Dispense Refill  . allopurinol (ZYLOPRIM) 100 MG tablet TAKE 1 TABLET 2 TO 3 TIMES A DAY FOR GOUT 90 tablet 1  . amLODipine (NORVASC) 5 MG tablet Take 1 tablet (5 mg total) by mouth daily. For blood pressure 90 tablet 3  . apixaban (ELIQUIS) 5 MG TABS tablet Take 1 tablet (5 mg total) by mouth 2 (two) times daily. 180 tablet 3  . atorvastatin (LIPITOR) 80 MG tablet Take 1 tablet (80 mg total) by mouth daily. 90 tablet 3  . carvedilol (COREG) 25 MG tablet TAKE ONE TABLET TWICE DAILY 180 tablet 2  . chlorthalidone (HYGROTON) 25 MG tablet Take 25 mg by mouth every morning.    . dapsone 100 MG tablet Take 1 tablet (100 mg total) by mouth daily. 10 tablet 0  . fluticasone (FLONASE) 50 MCG/ACT nasal spray Place 2 sprays into both nostrils daily. 16 g 6  . glimepiride (AMARYL) 2 MG tablet TAKE ONE TABLET ONCE DAILY EVERY MORNING 90 tablet 3  . hydrALAZINE (APRESOLINE) 100 MG tablet Take 1 tablet (100 mg total) by mouth 3 (three) times daily. 270 tablet 3  . isosorbide mononitrate (IMDUR) 30 MG 24 hr tablet TAKE ONE TABLET BY MOUTH DAILY 90 tablet 3  . levocetirizine (XYZAL) 5 MG tablet TAKE ONE TABLET EVERY EVENING 90 tablet 1  . lisinopril (ZESTRIL) 20 MG tablet Take 1 tablet (20 mg total) by mouth daily. 90 tablet 3  . polyethylene glycol (MIRALAX / GLYCOLAX) packet Take 17 g by mouth daily. 30 packet 1   No current facility-administered medications on file prior to visit.  ROS Review of Systems  Constitutional:  Negative for fever.  Respiratory:  Negative for shortness of breath.   Cardiovascular:  Negative for chest pain.  Musculoskeletal:  Positive for arthralgias (left forearm. Hurts to bend wrist).  Skin:  Negative for rash.    Objective:  BP (!)  144/54   Pulse 64   Temp (!) 97.2 F (36.2 C)   Ht 5\' 8"  (1.727 m)   Wt 197 lb (89.4 kg)   SpO2 96%   BMI 29.95 kg/m   BP Readings from Last 3 Encounters:  02/19/23 (!) 144/54  01/30/23 122/63  11/20/22 (!) 137/53    Wt Readings from Last 3 Encounters:  02/19/23 197 lb (89.4 kg)  01/30/23 198 lb (89.8 kg)  12/03/22 202 lb (91.6 kg)     Physical Exam Vitals reviewed.  Constitutional:      Appearance: He is well-developed.  HENT:     Head: Normocephalic and atraumatic.     Right Ear: External ear normal.     Left Ear: External ear normal.     Mouth/Throat:     Pharynx: No oropharyngeal exudate or posterior oropharyngeal erythema.  Eyes:     Pupils: Pupils are equal, round, and reactive to light.  Cardiovascular:     Rate and Rhythm: Normal rate and regular rhythm.     Heart sounds: No murmur heard. Pulmonary:     Effort: No respiratory distress.     Breath sounds: Normal breath sounds.  Musculoskeletal:     Cervical back: Normal range of motion and neck supple.  Neurological:     Mental Status: He is alert and oriented to person, place, and time.      Assessment & Plan:   Jeffren was seen today for medical management of chronic issues.  Diagnoses and all orders for this visit:  Type 2 diabetes mellitus with chronic kidney disease, without long-term current use of insulin, unspecified CKD stage (HCC) -     Bayer DCA Hb A1c Waived  Essential hypertension -     CBC with Differential/Platelet -     CMP14+EGFR  Pure hypercholesterolemia -     Lipid panel      I am having [name] maintain his polyethylene glycol, fluticasone, chlorthalidone, carvedilol, amLODipine, apixaban, atorvastatin, glimepiride, hydrALAZINE, lisinopril, isosorbide mononitrate, allopurinol, levocetirizine, and dapsone.  No orders of the defined types were placed in this encounter.    Follow-up: No follow-ups on file.  Mechele Claude, M.D.

## 2023-02-20 LAB — CMP14+EGFR
ALT: 20 IU/L (ref 0–44)
AST: 19 IU/L (ref 0–40)
Albumin/Globulin Ratio: 1.7 (ref 1.2–2.2)
Albumin: 4.2 g/dL (ref 3.8–4.8)
Alkaline Phosphatase: 100 IU/L (ref 44–121)
BUN/Creatinine Ratio: 23 (ref 10–24)
BUN: 35 mg/dL — ABNORMAL HIGH (ref 8–27)
Bilirubin Total: 0.5 mg/dL (ref 0.0–1.2)
CO2: 20 mmol/L (ref 20–29)
Calcium: 9.4 mg/dL (ref 8.6–10.2)
Chloride: 104 mmol/L (ref 96–106)
Creatinine, Ser: 1.5 mg/dL — ABNORMAL HIGH (ref 0.76–1.27)
Globulin, Total: 2.5 g/dL (ref 1.5–4.5)
Glucose: 109 mg/dL — ABNORMAL HIGH (ref 70–99)
Potassium: 4.1 mmol/L (ref 3.5–5.2)
Sodium: 139 mmol/L (ref 134–144)
Total Protein: 6.7 g/dL (ref 6.0–8.5)
eGFR: 47 mL/min/{1.73_m2} — ABNORMAL LOW (ref >59)

## 2023-02-20 LAB — CBC WITH DIFFERENTIAL/PLATELET
Basophils Absolute: 0 10*3/uL (ref 0.0–0.2)
Basos: 1 %
EOS (ABSOLUTE): 0.4 10*3/uL (ref 0.0–0.4)
Eos: 5 %
Hematocrit: 32 % — ABNORMAL LOW (ref 37.5–51.0)
Hemoglobin: 10.8 g/dL — ABNORMAL LOW (ref 13.0–17.7)
Immature Grans (Abs): 0 10*3/uL (ref 0.0–0.1)
Immature Granulocytes: 0 %
Lymphocytes Absolute: 2 10*3/uL (ref 0.7–3.1)
Lymphs: 31 %
MCH: 31.1 pg (ref 26.6–33.0)
MCHC: 33.8 g/dL (ref 31.5–35.7)
MCV: 92 fL (ref 79–97)
Monocytes Absolute: 0.5 10*3/uL (ref 0.1–0.9)
Monocytes: 8 %
Neutrophils Absolute: 3.6 10*3/uL (ref 1.4–7.0)
Neutrophils: 55 %
Platelets: 214 10*3/uL (ref 150–450)
RBC: 3.47 x10E6/uL — ABNORMAL LOW (ref 4.14–5.80)
RDW: 14.1 % (ref 11.6–15.4)
WBC: 6.4 10*3/uL (ref 3.4–10.8)

## 2023-02-20 LAB — LIPID PANEL
Chol/HDL Ratio: 2.6 ratio (ref 0.0–5.0)
Cholesterol, Total: 162 mg/dL (ref 100–199)
HDL: 62 mg/dL (ref >39)
LDL Chol Calc (NIH): 84 mg/dL (ref 0–99)
Triglycerides: 83 mg/dL (ref 0–149)
VLDL Cholesterol Cal: 16 mg/dL (ref 5–40)

## 2023-03-05 ENCOUNTER — Other Ambulatory Visit: Payer: Self-pay | Admitting: Family Medicine

## 2023-03-05 DIAGNOSIS — M1A9XX0 Chronic gout, unspecified, without tophus (tophi): Secondary | ICD-10-CM

## 2023-03-29 ENCOUNTER — Ambulatory Visit (INDEPENDENT_AMBULATORY_CARE_PROVIDER_SITE_OTHER): Payer: Medicare Other | Admitting: Family

## 2023-03-29 ENCOUNTER — Encounter: Payer: Self-pay | Admitting: Family

## 2023-03-29 VITALS — BP 133/59 | HR 59 | Temp 98.1°F | Ht 68.0 in | Wt 200.0 lb

## 2023-03-29 DIAGNOSIS — M25532 Pain in left wrist: Secondary | ICD-10-CM | POA: Diagnosis not present

## 2023-03-29 MED ORDER — METHYLPREDNISOLONE ACETATE 80 MG/ML IJ SUSP
80.0000 mg | Freq: Once | INTRAMUSCULAR | Status: AC
  Administered 2023-03-29: 80 mg via INTRAMUSCULAR

## 2023-03-29 NOTE — Patient Instructions (Signed)
Wrist Pain, Adult There are many things that can cause wrist pain. Some common causes include: An injury to the wrist area, such as a sprain, strain, or broken bone (fracture). Overuse of the joint. A condition that causes increased pressure on a nerve in the wrist (carpal tunnel syndrome). Wear and tear of the joints that occurs with aging (osteoarthritis). Other types of joint inflammation and stiffness (arthritis). Sometimes, the cause of wrist pain is not known. Often, the pain goes away when you follow instructions from your health care provider for relieving pain at home. This may include resting the wrist, icing the wrist, or using a splint or an elastic wrap for a short time. If your wrist pain continues, it is important to tell your provider. Follow these instructions at home: If you have a removable splint or elastic wrap: Wear the splint or wrap as told by your provider. Remove it only as told by your provider. Ask your provider if you may remove it for bathing. Check the skin around the splint or wrap every day. Tell your provider about any concerns. Loosen the splint or wrap if your fingers tingle, become numb, or turn cold and blue. Keep the splint or wrap clean. If the splint or wrap is not waterproof: Do not let it get wet. Cover it with a watertight covering when you take a bath or shower. Managing pain, stiffness, and swelling  If told, put ice on the painful area. If you have a removable splint or wrap, remove it as told by your provider. Put ice in a plastic bag. Place a towel between your skin and the bag or between your splint or wrap and the bag. Leave the ice on for 20 minutes, 2-3 times a day. If your skin turns bright red, remove the ice right away to prevent skin damage. The risk of damage is higher if you cannot feel pain, heat, or cold. Move your fingers often to reduce stiffness and swelling. Raise (elevate) the injured area above the level of your heart while  you are sitting or lying down. Activity Rest your affected wrist as told by your provider. Return to your normal activities as told by your provider. Ask your provider what activities are safe for you. Ask your provider when it is safe to drive if you have a splint or wrap on your wrist. Do exercises as told by your provider. General instructions Pay attention to any changes in your symptoms. Take over-the-counter and prescription medicines only as told by your provider. Contact a health care provider if: You have a sudden, sharp pain in the wrist, hand, or arm that is different or new. Any swelling or bruising on your wrist or hand gets worse. Your skin becomes red, has a rash, or has open sores. Your pain does not get better or it gets worse. You have a fever or chills. Get help right away if: You lose feeling in your fingers or hand. Your fingers turn white, very red, or cold and blue. You cannot move your fingers. This information is not intended to replace advice given to you by your health care provider. Make sure you discuss any questions you have with your health care provider. Document Revised: 06/15/2022 Document Reviewed: 06/15/2022 Elsevier Patient Education  2024 [employer].

## 2023-03-29 NOTE — Progress Notes (Signed)
Subjective:    Patient ID: Connor Bryant, male    DOB: 07-05-1946, 77 y.o.   MRN: [id number]  Chief Complaint  Patient presents with   Wrist Pain    Left wrist pain been seeing stack and he gives her steroid shoulder   Pt presents to the office today with left wrist pain that started yesterday. Reports he has had this flare up before and was given steroids that helped. Denies any injury.   He has A Fib and takes Eliquis BID for hx of CVA, A fib, and hx of PE.  Wrist Pain  The pain is present in the left wrist. This is a recurrent problem. There has been no history of extremity trauma. The problem occurs constantly. The problem has been gradually worsening. The quality of the pain is described as aching. The pain is at a severity of 10/10. Associated symptoms include joint swelling and a limited range of motion. The symptoms are aggravated by activity. He has tried acetaminophen and NSAIDS for the symptoms. The treatment provided mild relief. Family history includes gout.      Review of Systems  All other systems reviewed and are negative.      Objective:   Physical Exam Vitals reviewed.  Constitutional:      General: He is not in acute distress.    Appearance: He is well-developed.  HENT:     Head: Normocephalic.  Eyes:     General:        Right eye: No discharge.        Left eye: No discharge.     Pupils: Pupils are equal, round, and reactive to light.  Neck:     Thyroid: No thyromegaly.  Cardiovascular:     Rate and Rhythm: Normal rate and regular rhythm.     Heart sounds: Normal heart sounds. No murmur heard. Pulmonary:     Effort: Pulmonary effort is normal. No respiratory distress.     Breath sounds: Normal breath sounds. No wheezing.  Abdominal:     General: Bowel sounds are normal. There is no distension.     Palpations: Abdomen is soft.     Tenderness: There is no abdominal tenderness.  Musculoskeletal:        General: No tenderness.     Cervical back:  Normal range of motion and neck supple.  Skin:    General: Skin is warm and dry.     Findings: No erythema or rash.  Neurological:     Mental Status: He is alert and oriented to person, place, and time.     Cranial Nerves: No cranial nerve deficit.     Deep Tendon Reflexes: Reflexes are normal and symmetric.  Psychiatric:        Behavior: Behavior normal.        Thought Content: Thought content normal.        Judgment: Judgment normal.     BP (!) 133/59   Pulse (!) 59   Temp 98.1 F (36.7 C) (Temporal)   Ht 5\' 8"  (1.727 m)   Wt 200 lb (90.7 kg)   BMI 30.41 kg/m        Assessment & Plan:  Connor Bryant comes in today with chief complaint of Wrist Pain (Left wrist pain been seeing stack and he gives her steroid shoulder)   Diagnosis and orders addressed:  1. Left wrist pain Rest Ice  Can not take NSAID's because of Eliquis  Referral to Ortho per his request  Keep follow up with PCP  - methylPREDNISolone acetate (DEPO-MEDROL) injection 80 mg - Ambulatory referral to Hand Surgery - Uric acid  Jannifer Rodney, FNP

## 2023-04-09 ENCOUNTER — Ambulatory Visit: Payer: Medicare Other | Attending: Cardiology | Admitting: Cardiology

## 2023-04-09 ENCOUNTER — Encounter: Payer: Self-pay | Admitting: Cardiology

## 2023-04-09 VITALS — BP 130/80 | HR 58 | Ht 68.0 in | Wt 198.2 lb

## 2023-04-09 DIAGNOSIS — I350 Nonrheumatic aortic (valve) stenosis: Secondary | ICD-10-CM | POA: Diagnosis not present

## 2023-04-09 DIAGNOSIS — E782 Mixed hyperlipidemia: Secondary | ICD-10-CM | POA: Diagnosis not present

## 2023-04-09 DIAGNOSIS — I480 Paroxysmal atrial fibrillation: Secondary | ICD-10-CM

## 2023-04-09 DIAGNOSIS — I6523 Occlusion and stenosis of bilateral carotid arteries: Secondary | ICD-10-CM | POA: Diagnosis not present

## 2023-04-09 DIAGNOSIS — D6869 Other thrombophilia: Secondary | ICD-10-CM | POA: Diagnosis not present

## 2023-04-09 DIAGNOSIS — Z8679 Personal history of other diseases of the circulatory system: Secondary | ICD-10-CM

## 2023-04-09 DIAGNOSIS — I10 Essential (primary) hypertension: Secondary | ICD-10-CM | POA: Diagnosis not present

## 2023-04-09 NOTE — Progress Notes (Signed)
Clinical Summary Connor Bryant is a 77 y.o.male seen today for follow up of the following medical problems.    1. NSTEMI/Stress induced CM - NSTEMI druing admission where he presented with CVA initially - peak trop 2500  - 04/2020 echo LVEF 35% (acute change from 05/02/20 echo) with apical akinesis. - possible acute plaque rupture vs coronary embolic event given presented with new onset afib and stroke vs. Stress induced CM/Takotsubo CM given echo findings - admission complicated by PE, hematuria, anemia, AKI given recent stroke and respiratory failure on ventilator did not pursue emergent cath   06/2020 echo LVEF 60-65%, no WMAs  03/2022 echo LVEF 60-65%  - no chest pains, no SOB/DOE - compliant with meds     2. HFimpEF - new diagnosis during admission with CVA - - 04/2020 echo LVEF 35% (acute change from 05/02/20 echo) with apical akinesis.  - cath not pursued at that time due to CVA. Admission also complicated with PE, hematuria, anemia, AKI     06/2020 echo LVEF 60-65%, no WMAs - clincal scenario would suggest this may have been a stress induced CM in setting of his CVA 03/2022 echo LVEF 60-65%   - no recent SOB/DOE, no LE edema       2. PAF - new diagnosis during admission with CVA - 07/2020 monitor without recurrent afib    - no palpitations - compliant with meds - no bleeding on eliquis.      3. CVA - per pcp     4. PE - has been on eliquis   5. Anemia     6. PAD  s/p prior mesenteric artery bypass and recent bilateral iliac stenting at [facility] on 04/27/2020 - now off plavix, had noted some excessive gum bleeding. Just on eliquis - continues to f/u with vascualr, at 2/023 they had said just f/u as needed     7. Aortic stenosis - mild to moderate by 06/2020 echo, AVA VTI 1.18 and mean grad 16.  - no recent symptoms   03/2022 echo: LVEF 60-65%, grade I dd, mild to mod AS mean grad 28 AVA VTI 1.2   8. HTN -significant white coat HTN history - home  bp's 120s/60s   9. Carotid stenosis - noted on MRA - due for repeat imaging [place]   03/2022 carotid US RICA 1-39%, LICA 40-59%   10. CKD - followed by Dr Marisue Humble   11.Hyperlipidemia - 01/2022 TC 151 TG 129 HDL 62 LDL 67 - he is on atorvastatin   04/2022 TC 150 TG 83 HDL 81 LDL 53  01/2023 TC 162 TG 83 HDL 62 LDL 84      SH: wifre recently passed this year 2024, COPD and pneumonia Past Medical History:  Diagnosis Date   Acute CVA (cerebrovascular accident) (HCC) 05/01/2020   Acute respiratory failure with hypoxia (HCC) 05/04/2020   Arthritis    DM type 2 (diabetes mellitus, type 2) (HCC)    no meds   Endotracheally intubated    Gallstone    Heart murmur    was told one time he had a murmur, not since 30 years ago   HTN (hypertension)    Hyperlipidemia    Personal history of colonic polyp-adenoma 11/24/2013   Renal insufficiency    mild   Thoracic spine fracture (HCC)      No Known Allergies   Current Outpatient Medications  Medication Sig Dispense Refill   allopurinol (ZYLOPRIM) 100 MG tablet TAKE 1  TABLET 2 TO 3 TIMES A DAY FOR GOUT 90 tablet 0   amLODipine (NORVASC) 5 MG tablet Take 1 tablet (5 mg total) by mouth daily. For blood pressure 90 tablet 3   apixaban (ELIQUIS) 5 MG TABS tablet Take 1 tablet (5 mg total) by mouth 2 (two) times daily. 180 tablet 3   atorvastatin (LIPITOR) 80 MG tablet Take 1 tablet (80 mg total) by mouth daily. 90 tablet 3   carvedilol (COREG) 25 MG tablet TAKE ONE TABLET TWICE DAILY 180 tablet 2   chlorthalidone (HYGROTON) 25 MG tablet Take 25 mg by mouth every morning.     fluticasone (FLONASE) 50 MCG/ACT nasal spray Place 2 sprays into both nostrils daily. 16 g 6   glimepiride (AMARYL) 2 MG tablet TAKE ONE TABLET ONCE DAILY EVERY MORNING 90 tablet 3   hydrALAZINE (APRESOLINE) 100 MG tablet Take 1 tablet (100 mg total) by mouth 3 (three) times daily. 270 tablet 3   isosorbide mononitrate (IMDUR) 30 MG 24 hr tablet TAKE ONE TABLET BY MOUTH DAILY  90 tablet 3   levocetirizine (XYZAL) 5 MG tablet TAKE ONE TABLET EVERY EVENING 90 tablet 1   lisinopril (ZESTRIL) 20 MG tablet Take 1 tablet (20 mg total) by mouth daily. 90 tablet 3   polyethylene glycol (MIRALAX / GLYCOLAX) packet Take 17 g by mouth daily. 30 packet 1   No current facility-administered medications for this visit.     Past Surgical History:  Procedure Laterality Date   ABDOMINAL AORTAGRAM N/A 02/23/2014   Procedure: ABDOMINAL AORTAGRAM;  Surgeon: Fransisco Hertz, MD;  Location: [facility] CATH LAB;  Service: Cardiovascular;  Laterality: N/A;   CATARACT EXTRACTION, BILATERAL     COLONOSCOPY     DEBRIDEMENT TENNIS ELBOW Right    ESOPHAGOGASTRODUODENOSCOPY     MESENTERIC ARTERY BYPASS N/A 03/10/2014   Procedure: AORTO TO SUPERIOR MESENTERIC ARTERY BYPASS;  Surgeon: Pryor Ochoa, MD;  Location: [facility] OR;  Service: Vascular;  Laterality: N/A;   TONSILLECTOMY AND ADENOIDECTOMY     vascualr surgery     illiac stents     No Known Allergies    Family History  Problem Relation Age of Onset   Hypertension Father    Coronary artery disease Father    Hyperlipidemia Father    Diabetes Father    Bladder Cancer Father    Cancer Father    Heart disease Father    Heart attack Father    Diabetes Mother    Heart disease Mother        before age 58   Hyperlipidemia Mother    Hypertension Mother    AAA (abdominal aortic aneurysm) Mother    Colon cancer Neg Hx    Throat cancer Neg Hx    Kidney disease Neg Hx    Liver disease Neg Hx      Social History Connor Bryant reports that he quit smoking about 13 years ago. His smoking use included cigarettes. He has never used smokeless tobacco. Connor Bryant reports current alcohol use of about 12.0 standard drinks of alcohol per week.   Review of Systems CONSTITUTIONAL: No weight loss, fever, chills, weakness or fatigue.  HEENT: Eyes: No visual loss, blurred vision, double vision or yellow sclerae.No hearing loss, sneezing, congestion,  runny nose or sore throat.  SKIN: No rash or itching.  CARDIOVASCULAR: per hpi RESPIRATORY: per hpi GASTROINTESTINAL: No anorexia, nausea, vomiting or diarrhea. No abdominal pain or blood.  GENITOURINARY: No burning on urination, no polyuria NEUROLOGICAL: No  headache, dizziness, syncope, paralysis, ataxia, numbness or tingling in the extremities. No change in bowel or bladder control.  MUSCULOSKELETAL: No muscle, back pain, joint pain or stiffness.  LYMPHATICS: No enlarged nodes. No history of splenectomy.  PSYCHIATRIC: No history of depression or anxiety.  ENDOCRINOLOGIC: No reports of sweating, cold or heat intolerance. No polyuria or polydipsia.  Marland Kitchen   Physical Examination Today's Vitals   04/09/23 1404 04/09/23 1434  BP: 138/62 130/80  Pulse: (!) 58   SpO2: 96%   Weight: 198 lb 3.2 oz (89.9 kg)   Height: 5\' 8"  (1.727 m)    Body mass index is 30.14 kg/m.  Gen: resting comfortably, no acute distress HEENT: no scleral icterus, pupils equal round and reactive, no palptable cervical adenopathy,  CV: RRR, 3/6 systolc murmur rusb, no jvd. +bilateral carotid brutis Resp: Clear to auscultation bilaterally GI: abdomen is soft, non-tender, non-distended, normal bowel sounds, no hepatosplenomegaly MSK: extremities are warm, no edema.  Skin: warm, no rash Neuro:  no focal deficits Psych: appropriate affect   Diagnostic Studies  03/2022 carotid US Summary:  Right Carotid: Velocities in the right ICA are consistent with a 1-39%  stenosis.                Non-hemodynamically significant plaque <50% noted in the  CCA. The                 ECA appears >50% stenosed.   Left Carotid: Velocities in the left ICA are consistent with a 40-59%  stenosis.               Non-hemodynamically significant plaque <50% noted in the  CCA. The                ECA appears >50% stenosed. Based on peak velocities,  turbulent               flow and plaque formation.   Vertebrals:  Bilateral vertebral  arteries demonstrate antegrade flow.  Subclavians: Right subclavian artery was stenotic. Normal flow  hemodynamics were               seen in the left subclavian artery.      Assessment and Plan   1. History of cardiomyopathy - LVEF has normalized, likely stress induced CM - no symptoms, continue current meds   2.  HTN - white coat HTN history - at goal today, continue current meds   3 Afib/Acquired thrombophilia - no symptoms, continue current meds. He is on eliquis for stroke prevention   4. Aortic stenosis - mild to moderate by last echo - continue to monitor, repeat echo likely after next visit  5. Carotid stenosis - repeat echo already scheduled   6. Hyperlipidemia - uptrend in LDL. Recen chagnes in dietary habits with recent loss of his wife, he is working on adjusting back to prior diet. LDL had traditionally been in the 50s to 60s. Monitor at this time.    F/u 6 months  Antoine Poche, M.D.

## 2023-04-09 NOTE — Patient Instructions (Signed)
Medication Instructions:  Continue all current medications.   Labwork: none  Testing/Procedures: none  Follow-Up: 6 months   Any Other Special Instructions Will Be Listed Below (If Applicable).   If you need a refill on your cardiac medications before your next appointment, please call your pharmacy.  

## 2023-04-15 ENCOUNTER — Emergency Department (HOSPITAL_COMMUNITY)
Admission: EM | Admit: 2023-04-15 | Discharge: 2023-04-15 | Disposition: A | Discharge status: Home / Self Care | Payer: Medicare Other | Source: Home / Self Care | Attending: Emergency Medicine | Admitting: Emergency Medicine

## 2023-04-15 ENCOUNTER — Other Ambulatory Visit: Payer: Self-pay

## 2023-04-15 ENCOUNTER — Emergency Department (HOSPITAL_COMMUNITY): Payer: Medicare Other

## 2023-04-15 DIAGNOSIS — Z7901 Long term (current) use of anticoagulants: Secondary | ICD-10-CM | POA: Diagnosis not present

## 2023-04-15 DIAGNOSIS — M130 Polyarthritis, unspecified: Secondary | ICD-10-CM | POA: Diagnosis not present

## 2023-04-15 DIAGNOSIS — M1811 Unilateral primary osteoarthritis of first carpometacarpal joint, right hand: Secondary | ICD-10-CM | POA: Diagnosis not present

## 2023-04-15 DIAGNOSIS — M25539 Pain in unspecified wrist: Secondary | ICD-10-CM | POA: Diagnosis not present

## 2023-04-15 DIAGNOSIS — Z8673 Personal history of transient ischemic attack (TIA), and cerebral infarction without residual deficits: Secondary | ICD-10-CM | POA: Insufficient documentation

## 2023-04-15 DIAGNOSIS — M19011 Primary osteoarthritis, right shoulder: Secondary | ICD-10-CM | POA: Diagnosis not present

## 2023-04-15 DIAGNOSIS — Z7984 Long term (current) use of oral hypoglycemic drugs: Secondary | ICD-10-CM | POA: Insufficient documentation

## 2023-04-15 DIAGNOSIS — M2550 Pain in unspecified joint: Secondary | ICD-10-CM | POA: Diagnosis present

## 2023-04-15 DIAGNOSIS — M25531 Pain in right wrist: Secondary | ICD-10-CM | POA: Diagnosis not present

## 2023-04-15 DIAGNOSIS — E119 Type 2 diabetes mellitus without complications: Secondary | ICD-10-CM | POA: Diagnosis not present

## 2023-04-15 DIAGNOSIS — Z79899 Other long term (current) drug therapy: Secondary | ICD-10-CM | POA: Diagnosis not present

## 2023-04-15 DIAGNOSIS — M25511 Pain in right shoulder: Secondary | ICD-10-CM | POA: Diagnosis not present

## 2023-04-15 DIAGNOSIS — I10 Essential (primary) hypertension: Secondary | ICD-10-CM | POA: Diagnosis not present

## 2023-04-15 DIAGNOSIS — M25559 Pain in unspecified hip: Secondary | ICD-10-CM | POA: Diagnosis not present

## 2023-04-15 DIAGNOSIS — M25519 Pain in unspecified shoulder: Secondary | ICD-10-CM | POA: Diagnosis not present

## 2023-04-15 DIAGNOSIS — M25532 Pain in left wrist: Secondary | ICD-10-CM | POA: Diagnosis not present

## 2023-04-15 DIAGNOSIS — M1812 Unilateral primary osteoarthritis of first carpometacarpal joint, left hand: Secondary | ICD-10-CM | POA: Diagnosis not present

## 2023-04-15 DIAGNOSIS — Z743 Need for continuous supervision: Secondary | ICD-10-CM | POA: Diagnosis not present

## 2023-04-15 LAB — CBC WITH DIFFERENTIAL/PLATELET
Abs Immature Granulocytes: 0.04 10*3/uL (ref 0.00–0.07)
Basophils Absolute: 0 10*3/uL (ref 0.0–0.1)
Basophils Relative: 0 %
Eosinophils Absolute: 0.1 10*3/uL (ref 0.0–0.5)
Eosinophils Relative: 1 %
HCT: 36.8 % — ABNORMAL LOW (ref 39.0–52.0)
Hemoglobin: 12.2 g/dL — ABNORMAL LOW (ref 13.0–17.0)
Immature Granulocytes: 0 %
Lymphocytes Relative: 16 %
Lymphs Abs: 1.7 10*3/uL (ref 0.7–4.0)
MCH: 31.1 pg (ref 26.0–34.0)
MCHC: 33.2 g/dL (ref 30.0–36.0)
MCV: 93.9 fL (ref 80.0–100.0)
Monocytes Absolute: 1 10*3/uL (ref 0.1–1.0)
Monocytes Relative: 9 %
Neutro Abs: 7.7 10*3/uL (ref 1.7–7.7)
Neutrophils Relative %: 74 %
Platelets: 208 10*3/uL (ref 150–400)
RBC: 3.92 MIL/uL — ABNORMAL LOW (ref 4.22–5.81)
RDW: 14.6 % (ref 11.5–15.5)
WBC: 10.5 10*3/uL (ref 4.0–10.5)
nRBC: 0 % (ref 0.0–0.2)

## 2023-04-15 LAB — COMPREHENSIVE METABOLIC PANEL
ALT: 14 U/L (ref 0–44)
AST: 15 U/L (ref 15–41)
Albumin: 3.7 g/dL (ref 3.5–5.0)
Alkaline Phosphatase: 63 U/L (ref 38–126)
Anion gap: 7 (ref 5–15)
BUN: 31 mg/dL — ABNORMAL HIGH (ref 8–23)
CO2: 25 mmol/L (ref 22–32)
Calcium: 8.8 mg/dL — ABNORMAL LOW (ref 8.9–10.3)
Chloride: 101 mmol/L (ref 98–111)
Creatinine, Ser: 1.43 mg/dL — ABNORMAL HIGH (ref 0.61–1.24)
GFR, Estimated: 50 mL/min — ABNORMAL LOW (ref >60)
Glucose, Bld: 161 mg/dL — ABNORMAL HIGH (ref 70–99)
Potassium: 3.7 mmol/L (ref 3.5–5.1)
Sodium: 133 mmol/L — ABNORMAL LOW (ref 135–145)
Total Bilirubin: 1.4 mg/dL — ABNORMAL HIGH (ref 0.3–1.2)
Total Protein: 7.3 g/dL (ref 6.5–8.1)

## 2023-04-15 LAB — SEDIMENTATION RATE: Sed Rate: 43 mm/hr — ABNORMAL HIGH (ref 0–16)

## 2023-04-15 MED ORDER — DOXYCYCLINE HYCLATE 100 MG PO CAPS: 100.0000 mg | ORAL_CAPSULE | Freq: Two times a day (BID) | ORAL | 0 refills | Status: DC

## 2023-04-15 MED ORDER — PREDNISONE 10 MG PO TABS: 40.0000 mg | ORAL_TABLET | Freq: Every day | ORAL | 0 refills | Status: DC

## 2023-04-15 MED ORDER — PREDNISONE 50 MG PO TABS
60.0000 mg | ORAL_TABLET | Freq: Once | ORAL | Status: AC
  Administered 2023-04-15: 60 mg via ORAL
  Filled 2023-04-15: qty 1

## 2023-04-15 NOTE — ED Provider Notes (Addendum)
[place] EMERGENCY DEPARTMENT AT [facility] Provider Note   CSN: [id number] Arrival date & time: 04/15/23  0801     History  Chief Complaint  Patient presents with   Joint Pain    Connor Bryant is a 77 y.o. male.  Patient with complaint of several joint pains.  Seen July 5 for left wrist pain was given steroid injection.  Patient now with left wrist pain right wrist pain right shoulder pain is also difficulty with left hip.  Patient does give a history of multiple tick bites none within the last couple weeks.  [facility].  They referred him to hand surgery for evaluation of the left wrist pain.  But now has multiple joints.  Denies any headache or any fevers no nausea or vomiting no cranial nerve abnormalities.  No visual changes.  Patient followed by cardiology for atrial fibrillation and is on Eliquis.  Past history is also significant for type 2 diabetes and hypertension history of CVA in 2021       Home Medications Prior to Admission medications   Medication Sig Start Date End Date Taking? Authorizing Provider  allopurinol (ZYLOPRIM) 100 MG tablet TAKE 1 TABLET 2 TO 3 TIMES A DAY FOR GOUT 03/05/23   Mechele Claude, MD  amLODipine (NORVASC) 5 MG tablet Take 1 tablet (5 mg total) by mouth daily. For blood pressure 11/20/22   Mechele Claude, MD  apixaban (ELIQUIS) 5 MG TABS tablet Take 1 tablet (5 mg total) by mouth 2 (two) times daily. 11/20/22   Mechele Claude, MD  atorvastatin (LIPITOR) 80 MG tablet Take 1 tablet (80 mg total) by mouth daily. 11/20/22   Mechele Claude, MD  carvedilol (COREG) 25 MG tablet TAKE ONE TABLET TWICE DAILY 10/24/22   Antoine Poche, MD  chlorthalidone (HYGROTON) 25 MG tablet Take 25 mg by mouth every morning. 12/12/21   [provider]  fluticasone (FLONASE) 50 MCG/ACT nasal spray Place 2 sprays into both nostrils daily. 06/14/21   Sonny Masters, FNP  glimepiride (AMARYL) 2 MG tablet TAKE  ONE TABLET ONCE DAILY EVERY MORNING 11/20/22   Mechele Claude, MD  hydrALAZINE (APRESOLINE) 100 MG tablet Take 1 tablet (100 mg total) by mouth 3 (three) times daily. 11/20/22   Mechele Claude, MD  isosorbide mononitrate (IMDUR) 30 MG 24 hr tablet TAKE ONE TABLET BY MOUTH DAILY 11/28/22   Antoine Poche, MD  levocetirizine (XYZAL) 5 MG tablet TAKE ONE TABLET EVERY EVENING 12/18/22   Mechele Claude, MD  lisinopril (ZESTRIL) 20 MG tablet Take 1 tablet (20 mg total) by mouth daily. 11/20/22 11/15/23  Mechele Claude, MD  polyethylene glycol (MIRALAX / Ethelene Hal) packet Take 17 g by mouth daily. 10/02/18   Leatha Gilding, MD      Allergies    Patient has no known allergies.    Review of Systems   Review of Systems  Constitutional:  Negative for chills and fever.  HENT:  Negative for ear pain and sore throat.   Eyes:  Negative for pain and visual disturbance.  Respiratory:  Negative for cough and shortness of breath.   Cardiovascular:  Negative for chest pain and palpitations.  Gastrointestinal:  Negative for abdominal pain and vomiting.  Genitourinary:  Negative for dysuria and hematuria.  Musculoskeletal:  Positive for arthralgias and joint swelling. Negative for back pain.  Skin:  Negative for color change and rash.  Neurological:  Negative for seizures and syncope.  All other  systems reviewed and are negative.   Physical Exam Updated Vital Signs BP (!) 121/49   Pulse 64   Temp 97.8 F (36.6 C) (Oral)   Resp 15   Ht 1.727 m (5\' 8" )   Wt 89.4 kg   SpO2 95%   BMI 29.95 kg/m  Physical Exam Vitals and nursing note reviewed.  Constitutional:      General: He is not in acute distress.    Appearance: Normal appearance. He is well-developed.  HENT:     Head: Normocephalic and atraumatic.  Eyes:     Extraocular Movements: Extraocular movements intact.     Conjunctiva/sclera: Conjunctivae normal.     Pupils: Pupils are equal, round, and reactive to light.  Cardiovascular:     Rate  and Rhythm: Normal rate and regular rhythm.     Heart sounds: No murmur heard. Pulmonary:     Effort: Pulmonary effort is normal. No respiratory distress.     Breath sounds: Normal breath sounds.  Abdominal:     Palpations: Abdomen is soft.     Tenderness: There is no abdominal tenderness.  Musculoskeletal:        General: No swelling or deformity.     Cervical back: Normal range of motion and neck supple.     Right lower leg: No edema.     Left lower leg: No edema.     Comments: No obvious joint swelling.  No erythema.  Radial pulses are 2+ dorsalis pedis pulses 1+  Skin:    General: Skin is warm and dry.     Capillary Refill: Capillary refill takes less than 2 seconds.  Neurological:     General: No focal deficit present.     Mental Status: He is alert and oriented to person, place, and time.     Cranial Nerves: No cranial nerve deficit.     Sensory: No sensory deficit.     Motor: No weakness.  Psychiatric:        Mood and Affect: Mood normal.     ED Results / Procedures / Treatments   Labs (all labs ordered are listed, but only abnormal results are displayed) Labs Reviewed  CBC WITH DIFFERENTIAL/PLATELET  COMPREHENSIVE METABOLIC PANEL  SEDIMENTATION RATE  RHEUMATOID FACTOR  LYME DISEASE SEROLOGY W/REFLEX    EKG None  Radiology No results found.  Procedures Procedures    Medications Ordered in ED Medications - No data to display  ED Course/ Medical Decision Making/ A&P                             Medical Decision Making Amount and/or Complexity of Data Reviewed Labs: ordered. Radiology: ordered.   Patient is on Eliquis for history of atrial fibrillation.  Patient's history could be consistent with Lyme's disease.  Particular with multiple joints being involved.  There is a family history of rheumatoid arthritis but patient is never had any symptoms of this up to this point in time.  Will do lab workup will get Lyme titer will get x-rays.  X-rays  are highly suggestive of osteoarthritis both wrist and shoulder.  Lab workup CBC white count 10.5 hemoglobin 12.2 complete metabolic panel other than total bili of 1.4 and GFR 50 very normal.  Sed rate was just 43 which is reassuring rheumatoid factor is pending and Lyme disease serology is pending.  Based on this we will go ahead and treat with 60 mg of prednisone here then a  5-day course of 40 mg prednisone will also go ahead and just cover for secondary Lyme's disease with doxycycline.   Final Clinical Impression(s) / ED Diagnoses Final diagnoses:  Polyarthropathy    Rx / DC Orders ED Discharge Orders     None         Vanetta Mulders, MD 04/15/23 [id number]    Vanetta Mulders, MD 04/15/23 7106    Vanetta Mulders, MD 04/15/23 [phone]

## 2023-04-15 NOTE — ED Triage Notes (Signed)
Pt arrived [facility] for joint pain in wrists, right shoulder, and left hip x 2 months. Pt states his wrists hurt so bad he did not feel safe to drive.

## 2023-04-15 NOTE — Discharge Instructions (Addendum)
Take the antibiotic as directed for 2 weeks.  May need to take it longer if there is some improvement.  Take the prednisone as directed for the next 5 days.  Make an appointment to follow back up with your primary care provider.  Lyme disease serology is pending.  But as we discussed your x-rays are significant for osteoarthritis.

## 2023-04-16 LAB — LYME DISEASE SEROLOGY W/REFLEX: Lyme Total Antibody EIA: NEGATIVE

## 2023-04-17 ENCOUNTER — Other Ambulatory Visit: Payer: Self-pay | Admitting: Family Medicine

## 2023-04-17 DIAGNOSIS — M1A9XX0 Chronic gout, unspecified, without tophus (tophi): Secondary | ICD-10-CM

## 2023-04-17 LAB — RHEUMATOID FACTOR: Rheumatoid fact SerPl-aCnc: 19.1 IU/mL — ABNORMAL HIGH (ref <14.0)

## 2023-04-22 ENCOUNTER — Encounter: Payer: Self-pay | Admitting: Family Medicine

## 2023-04-22 ENCOUNTER — Ambulatory Visit (INDEPENDENT_AMBULATORY_CARE_PROVIDER_SITE_OTHER): Payer: Medicare Other | Admitting: Family Medicine

## 2023-04-22 VITALS — BP 145/68 | HR 63 | Temp 97.2°F | Ht 68.0 in | Wt 199.0 lb

## 2023-04-22 DIAGNOSIS — E559 Vitamin D deficiency, unspecified: Secondary | ICD-10-CM

## 2023-04-22 DIAGNOSIS — M0579 Rheumatoid arthritis with rheumatoid factor of multiple sites without organ or systems involvement: Secondary | ICD-10-CM | POA: Diagnosis not present

## 2023-04-22 MED ORDER — PREDNISONE 20 MG PO TABS: ORAL_TABLET | ORAL | 0 refills | Status: DC

## 2023-04-22 NOTE — Progress Notes (Signed)
Subjective:  Patient ID: Connor Bryant, male    DOB: 04-16-1946  Age: 77 y.o. MRN: [id number]  CC: ER FOLLOW UP and polyarthralgia   HPI Connor Bryant presents for   Strong Fhx of rheumatoid arthritis. Lots of hand pain bilaterally and right shoulder pain. Neg Lymes drawn at E.D. Has positive RA & elevated ESR from E.D. Went on 5 days of prednisone 40 mg daily and sx have resolved.      04/22/2023    4:23 PM 02/19/2023    3:07 PM 12/03/2022    2:55 PM  Depression screen PHQ 2/9  Decreased Interest 0 0 0  Down, Depressed, Hopeless 0 0 0  PHQ - 2 Score 0 0 0    History Connor Bryant has a past medical history of Acute CVA (cerebrovascular accident) (HCC) (05/01/2020), Acute respiratory failure with hypoxia (HCC) (05/04/2020), Arthritis, DM type 2 (diabetes mellitus, type 2) (HCC), Endotracheally intubated, Gallstone, Heart murmur, HTN (hypertension), Hyperlipidemia, Personal history of colonic polyp-adenoma (11/24/2013), Renal insufficiency, and Thoracic spine fracture (HCC).   He has a past surgical history that includes Debridement tennis elbow (Right); Tonsillectomy and adenoidectomy; Colonoscopy; Esophagogastroduodenoscopy; Mesenteric artery bypass (N/A, 03/10/2014); abdominal aortagram (N/A, 02/23/2014); vascualr surgery; and Cataract extraction, bilateral.   His family history includes AAA (abdominal aortic aneurysm) in his mother; Bladder Cancer in his father; Cancer in his father; Coronary artery disease in his father; Diabetes in his father and mother; Heart attack in his father; Heart disease in his father and mother; Hyperlipidemia in his father and mother; Hypertension in his father and mother.He reports that he quit smoking about 13 years ago. His smoking use included cigarettes. He has never used smokeless tobacco. He reports current alcohol use of about 12.0 standard drinks of alcohol per week. He reports that he does not currently use drugs.    ROS Review of Systems   Constitutional:  Negative for fever.  Respiratory:  Negative for shortness of breath.   Cardiovascular:  Negative for chest pain.  Musculoskeletal:  Negative for arthralgias.  Skin:  Negative for rash.    Objective:  BP (!) 145/68   Pulse 63   Temp (!) 97.2 F (36.2 C)   Ht 5\' 8"  (1.727 m)   Wt 199 lb (90.3 kg)   SpO2 96%   BMI 30.26 kg/m   BP Readings from Last 3 Encounters:  04/22/23 (!) 145/68  04/15/23 (!) 142/58  04/09/23 130/80    Wt Readings from Last 3 Encounters:  04/22/23 199 lb (90.3 kg)  04/15/23 197 lb (89.4 kg)  04/09/23 198 lb 3.2 oz (89.9 kg)     Physical Exam Vitals reviewed.  Constitutional:      Appearance: He is well-developed.  HENT:     Head: Normocephalic and atraumatic.     Right Ear: External ear normal.     Left Ear: External ear normal.     Mouth/Throat:     Pharynx: No oropharyngeal exudate or posterior oropharyngeal erythema.  Eyes:     Pupils: Pupils are equal, round, and reactive to light.  Cardiovascular:     Rate and Rhythm: Normal rate and regular rhythm.     Heart sounds: No murmur heard. Pulmonary:     Effort: No respiratory distress.     Breath sounds: Normal breath sounds.  Musculoskeletal:     Cervical back: Normal range of motion and neck supple.  Neurological:     Mental Status: He is alert and oriented to person, place, and  time.       Assessment & Plan:   Connor "Gabriel Rung" was seen today for er follow up and polyarthralgia.  Diagnoses and all orders for this visit:  Vitamin D deficiency -     VITAMIN D 25 Hydroxy (Vit-D Deficiency, Fractures)  Rheumatoid arthritis involving multiple sites with positive rheumatoid factor (HCC) -     Ambulatory referral to Rheumatology  Other orders -     predniSONE (DELTASONE) 20 MG tablet; One twice daily with food for 2 weeks. Then one daily for 2 weeks       I have discontinued Connor Birch "[name]"'s predniSONE. I am also having him start on predniSONE.  Additionally, I am having him maintain his polyethylene glycol, fluticasone, chlorthalidone, carvedilol, amLODipine, apixaban, atorvastatin, glimepiride, hydrALAZINE, lisinopril, isosorbide mononitrate, levocetirizine, doxycycline, and allopurinol.  Allergies as of 04/22/2023   No Known Allergies      Medication List        Accurate as of April 22, 2023  5:01 PM. If you have any questions, ask your nurse or doctor.          allopurinol 100 MG tablet Commonly known as: ZYLOPRIM TAKE 1 TABLET 2 TO 3 TIMES A DAY FOR GOUT   amLODipine 5 MG tablet Commonly known as: NORVASC Take 1 tablet (5 mg total) by mouth daily. For blood pressure   apixaban 5 MG Tabs tablet Commonly known as: Eliquis Take 1 tablet (5 mg total) by mouth 2 (two) times daily.   atorvastatin 80 MG tablet Commonly known as: LIPITOR Take 1 tablet (80 mg total) by mouth daily.   carvedilol 25 MG tablet Commonly known as: COREG TAKE ONE TABLET TWICE DAILY   chlorthalidone 25 MG tablet Commonly known as: HYGROTON Take 25 mg by mouth every morning.   doxycycline 100 MG capsule Commonly known as: VIBRAMYCIN Take 1 capsule (100 mg total) by mouth 2 (two) times daily.   fluticasone 50 MCG/ACT nasal spray Commonly known as: FLONASE Place 2 sprays into both nostrils daily.   glimepiride 2 MG tablet Commonly known as: AMARYL TAKE ONE TABLET ONCE DAILY EVERY MORNING   hydrALAZINE 100 MG tablet Commonly known as: APRESOLINE Take 1 tablet (100 mg total) by mouth 3 (three) times daily.   isosorbide mononitrate 30 MG 24 hr tablet Commonly known as: IMDUR TAKE ONE TABLET BY MOUTH DAILY   levocetirizine 5 MG tablet Commonly known as: XYZAL TAKE ONE TABLET EVERY EVENING   lisinopril 20 MG tablet Commonly known as: ZESTRIL Take 1 tablet (20 mg total) by mouth daily.   polyethylene glycol 17 g packet Commonly known as: MIRALAX / GLYCOLAX Take 17 g by mouth daily.   predniSONE 20 MG tablet Commonly known  as: DELTASONE One twice daily with food for 2 weeks. Then one daily for 2 weeks What changed:  medication strength how much to take how to take this when to take this additional instructions Changed by: Broadus John Jaamal Farooqui         Follow-up: No follow-ups on file.  Mechele Claude, M.D.

## 2023-04-29 ENCOUNTER — Ambulatory Visit: Payer: Medicare Other | Admitting: Orthopedic Surgery

## 2023-05-01 ENCOUNTER — Ambulatory Visit (INDEPENDENT_AMBULATORY_CARE_PROVIDER_SITE_OTHER): Payer: Medicare Other | Admitting: Nurse Practitioner

## 2023-05-01 ENCOUNTER — Encounter: Payer: Self-pay | Admitting: Nurse Practitioner

## 2023-05-01 VITALS — BP 135/59 | HR 71 | Temp 98.7°F | Resp 20 | Ht 68.0 in | Wt 194.0 lb

## 2023-05-01 DIAGNOSIS — J069 Acute upper respiratory infection, unspecified: Secondary | ICD-10-CM

## 2023-05-01 MED ORDER — PREDNISONE 20 MG PO TABS
40.0000 mg | ORAL_TABLET | Freq: Every day | ORAL | 0 refills | Status: DC
Start: 2023-05-01 — End: 2023-06-14

## 2023-05-01 MED ORDER — BENZONATATE 100 MG PO CAPS: 100.0000 mg | ORAL_CAPSULE | Freq: Three times a day (TID) | ORAL | 0 refills | Status: DC | PRN

## 2023-05-01 NOTE — Progress Notes (Addendum)
Subjective:    Patient ID: Connor Bryant, male    DOB: 1945-12-28, 77 y.o.   MRN: [id number]   Chief Complaint: sinusitis  Sinusitis This is a new problem. The current episode started in the past 7 days. The problem has been gradually improving since onset. There has been no fever. His pain is at a severity of 4/10. The pain is mild. Associated symptoms include congestion, coughing and sinus pressure. Past treatments include acetaminophen and saline nose sprays. The treatment provided mild relief.    Patient Active Problem List   Diagnosis Date Noted   Non-ST elevation (NSTEMI) myocardial infarction [facility])    Acute systolic heart failure (HCC)    Elevated troponin 05/03/2020   Fever 05/03/2020   Positive D dimer 05/03/2020   PAF (paroxysmal atrial fibrillation) (HCC) 05/02/2020   S/P insertion of iliac artery stent 05/01/2020   Diabetic lipidosis (HCC) 03/10/2020   Intermittent claudication (HCC) 03/10/2020   Compression fracture of T12 vertebra (HCC) 10/27/2018   Spinal stenosis of lumbosacral region 10/27/2018   Small bowel obstruction (HCC) 09/30/2018   Back pain of lumbar region with sciatica 08/26/2018   Chronic left-sided low back pain with left-sided sciatica 08/26/2018   Tremor 08/26/2018   Pure hypercholesterolemia 02/25/2018   Atherosclerosis of aorta (HCC) 02/24/2018   Bilateral hip pain 06/24/2017   Body mass index 30.0-30.9, adult 06/06/2016   Gout 06/06/2016   Essential hypertension 06/06/2016   Chronic mesenteric ischemia (HCC) 03/01/2014   Peripheral vascular disease (HCC) 02/16/2014   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  HENT:  Positive for congestion, sinus pressure and voice change. Negative for trouble swallowing.   Respiratory:  Positive for cough.        Objective:   Physical Exam Constitutional:       Appearance: Normal appearance. He is obese.  HENT:     Right Ear: Tympanic membrane normal.     Left Ear: Tympanic membrane normal.     Nose: Congestion and rhinorrhea present.     Mouth/Throat:     Pharynx: No oropharyngeal exudate or posterior oropharyngeal erythema.  Cardiovascular:     Rate and Rhythm: Normal rate and regular rhythm.     Heart sounds: Murmur (3/6 systolic) heard.  Pulmonary:     Breath sounds: Normal breath sounds.  Musculoskeletal:     Cervical back: Normal range of motion and neck supple.  Skin:    General: Skin is warm.  Neurological:     General: No focal deficit present.     Mental Status: He is alert and oriented to person, place, and time.  Psychiatric:        Mood and Affect: Mood normal.        Behavior: Behavior normal.    BP (!) 135/59   Pulse 71   Temp 98.7 F (37.1 C) (Temporal)   Resp 20   Ht 5\' 8"  (1.727 m)   Wt 194 lb (88 kg)   SpO2 97%   BMI 29.50 kg/m         Assessment & Plan:  Connor Bryant comes in today with chief complaint of Sinusitis and ears hurting   Diagnosis and orders addressed:  1. URI with cough and congestion 1. Take meds as prescribed 2. Use a cool mist humidifier especially during the winter months and when  heat has been humid. 3. Use saline nose sprays frequently 4. Saline irrigations of the nose can be very helpful if done frequently.  * 4X daily for 1 week*  * Use of a nettie pot can be helpful with this. Follow directions with this* 5. Drink plenty of fluids 6. Keep thermostat turn down low 7.For any cough or congestion- tessalon perles 8. For fever or aces or pains- take tylenol or ibuprofen appropriate for age and weight.  * for fevers greater than 101 orally you may alternate ibuprofen and tylenol every  3 hours.    - predniSONE (DELTASONE) 20 MG tablet; Take 2 tablets (40 mg total) by mouth daily with breakfast for 5 days. 2 po daily for 5 days  Dispense: 10 tablet; Refill: 0 - benzonatate  (TESSALON PERLES) 100 MG capsule; Take 1 capsule (100 mg total) by mouth 3 (three) times daily as needed for cough.  Dispense: 20 capsule; Refill: 0    Follow up plan: prn   Mary-Margaret Daphine Deutscher, FNP

## 2023-05-01 NOTE — Patient Instructions (Signed)

## 2023-05-08 ENCOUNTER — Ambulatory Visit: Payer: Medicare Other | Attending: Cardiology

## 2023-05-08 DIAGNOSIS — I6523 Occlusion and stenosis of bilateral carotid arteries: Secondary | ICD-10-CM | POA: Diagnosis not present

## 2023-05-23 ENCOUNTER — Encounter: Payer: Self-pay | Admitting: Family Medicine

## 2023-05-23 ENCOUNTER — Ambulatory Visit (INDEPENDENT_AMBULATORY_CARE_PROVIDER_SITE_OTHER): Payer: Medicare Other | Admitting: Family Medicine

## 2023-05-23 VITALS — BP 111/57 | HR 91 | Temp 97.5°F | Ht 68.0 in | Wt 200.2 lb

## 2023-05-23 DIAGNOSIS — H6592 Unspecified nonsuppurative otitis media, left ear: Secondary | ICD-10-CM

## 2023-05-23 DIAGNOSIS — E7800 Pure hypercholesterolemia, unspecified: Secondary | ICD-10-CM

## 2023-05-23 DIAGNOSIS — E1122 Type 2 diabetes mellitus with diabetic chronic kidney disease: Secondary | ICD-10-CM | POA: Diagnosis not present

## 2023-05-23 DIAGNOSIS — Z7984 Long term (current) use of oral hypoglycemic drugs: Secondary | ICD-10-CM | POA: Diagnosis not present

## 2023-05-23 DIAGNOSIS — N1832 Chronic kidney disease, stage 3b: Secondary | ICD-10-CM

## 2023-05-23 DIAGNOSIS — E119 Type 2 diabetes mellitus without complications: Secondary | ICD-10-CM

## 2023-05-23 DIAGNOSIS — I10 Essential (primary) hypertension: Secondary | ICD-10-CM

## 2023-05-23 LAB — BAYER DCA HB A1C WAIVED: HB A1C (BAYER DCA - WAIVED): 6.3 % — ABNORMAL HIGH (ref 4.8–5.6)

## 2023-05-23 MED ORDER — AMOXICILLIN-POT CLAVULANATE 875-125 MG PO TABS
1.0000 | ORAL_TABLET | Freq: Two times a day (BID) | ORAL | 0 refills | Status: AC
Start: 2023-05-23 — End: 2023-06-12

## 2023-05-23 NOTE — Progress Notes (Addendum)
Subjective:  Patient ID: Connor Bryant, male    DOB: 26-Jun-1946  Age: 77 y.o. MRN: [id number]  CC: Medical Management of Chronic Issues   HPI Connor Bryant presents for recheck of ear pain on left. Also using the prednisone PRN. When the pain and stiffness flares Connor Bryant takes it 3-5 days. When Connor Bryant feels better, Connor Bryant Dcs. The prednisone until the next flare. Lasts around 2 weeks. Goes away the next day when Connor Bryant starts the prednisone.    in for follow-up of elevated cholesterol. Doing well without complaints on current medication. Denies side effects of statin including myalgia and arthralgia and nausea. Currently no chest pain, shortness of breath or other cardiovascular related symptoms noted.  presents forFollow-up of diabetes. Patient denies symptoms such as polyuria, polydipsia, excessive hunger, nausea No significant hypoglycemic spells noted. Medications reviewed. Pt reports taking them regularly without complication/adverse reaction being reported today.  Lab Results  Component Value Date   HGBA1C 6.3 (H) 05/23/2023   HGBA1C 5.3 02/19/2023   HGBA1C 6.6 (H) 11/20/2022      presents for  follow-up of hypertension. Patient has no history of headache chest pain or shortness of breath or recent cough. Patient also denies symptoms of TIA such as focal numbness or weakness. Patient denies side effects from medication. States taking it regularly.      05/23/2023    2:21 PM 05/01/2023    1:56 PM 04/22/2023    4:23 PM  Depression screen PHQ 2/9  Decreased Interest 0 0 0  Down, Depressed, Hopeless 0 0 0  PHQ - 2 Score 0 0 0  Altered sleeping  0   Tired, decreased energy  0   Change in appetite  0   Feeling bad or failure about yourself   0   Trouble concentrating  0   Moving slowly or fidgety/restless  0   Suicidal thoughts  0   PHQ-9 Score  0   Difficult doing work/chores  Not difficult at all     History Connor Bryant has a past medical history of Acute CVA (cerebrovascular accident)  (HCC) (05/01/2020), Acute respiratory failure with hypoxia (HCC) (05/04/2020), Arthritis, DM type 2 (diabetes mellitus, type 2) (HCC), Endotracheally intubated, Gallstone, Heart murmur, HTN (hypertension), Hyperlipidemia, Personal history of colonic polyp-adenoma (11/24/2013), Renal insufficiency, and Thoracic spine fracture (HCC).   Connor Bryant has a past surgical history that includes Debridement tennis elbow (Right); Tonsillectomy and adenoidectomy; Colonoscopy; Esophagogastroduodenoscopy; Mesenteric artery bypass (N/A, 03/10/2014); abdominal aortagram (N/A, 02/23/2014); vascualr surgery; and Cataract extraction, bilateral.   His family history includes AAA (abdominal aortic aneurysm) in his mother; Bladder Cancer in his father; Cancer in his father; Coronary artery disease in his father; Diabetes in his father and mother; Heart attack in his father; Heart disease in his father and mother; Hyperlipidemia in his father and mother; Hypertension in his father and mother; Stroke in his father.Connor Bryant reports that Connor Bryant quit smoking about 13 years ago. His smoking use included cigarettes. Connor Bryant has never used smokeless tobacco. Connor Bryant reports current alcohol use of about 12.0 standard drinks of alcohol per week. Connor Bryant reports that Connor Bryant does not currently use drugs.    ROS Review of Systems  Constitutional:  Negative for fever.  HENT:  Positive for ear pain.   Respiratory:  Negative for shortness of breath.   Cardiovascular:  Negative for chest pain.  Musculoskeletal:  Positive for arthralgias and joint swelling.  Skin:  Negative for rash.    Objective:  BP (!) 111/57  Pulse 91   Temp (!) 97.5 F (36.4 C)   Ht 5\' 8"  (1.727 m)   Wt 200 lb 3.2 oz (90.8 kg)   SpO2 95%   BMI 30.44 kg/m   BP Readings from Last 3 Encounters:  05/23/23 (!) 111/57  05/01/23 (!) 135/59  04/22/23 (!) 145/68    Wt Readings from Last 3 Encounters:  05/23/23 200 lb 3.2 oz (90.8 kg)  05/01/23 194 lb (88 kg)  04/22/23 199 lb (90.3 kg)      Physical Exam Vitals reviewed.  Constitutional:      Appearance: Connor Bryant is well-developed.  HENT:     Head: Normocephalic and atraumatic.     Right Ear: External ear normal.     Left Ear: External ear normal.     Ears:     Comments: Left TM has residual fluid with "glue ear" appearance    Mouth/Throat:     Pharynx: No oropharyngeal exudate or posterior oropharyngeal erythema.  Eyes:     Pupils: Pupils are equal, round, and reactive to light.  Cardiovascular:     Rate and Rhythm: Normal rate and regular rhythm.     Heart sounds: No murmur heard. Pulmonary:     Effort: No respiratory distress.     Breath sounds: Normal breath sounds.  Musculoskeletal:     Cervical back: Normal range of motion and neck supple.  Neurological:     Mental Status: Connor Bryant is alert and oriented to person, place, and time.       Assessment & Plan:   Connor Bryant" was seen today for medical management of chronic issues.  Diagnoses and all orders for this visit:  Type 2 diabetes mellitus with stage 3b chronic kidney disease, without long-term current use of insulin (HCC) -     Bayer DCA Hb A1c Waived -     CBC with Differential/Platelet -     CMP14+EGFR -     Lipid panel  Essential hypertension -     CBC with Differential/Platelet -     CMP14+EGFR  Pure hypercholesterolemia -     Lipid panel  Left otitis media with effusion -     amoxicillin-clavulanate (AUGMENTIN) 875-125 MG tablet; Take 1 tablet by mouth 2 (two) times daily for 20 days. Take all of this medication  Diabetes mellitus treated with oral medication (HCC) -     Bayer DCA Hb A1c Waived -     CBC with Differential/Platelet -     CMP14+EGFR -     Lipid panel       I am having Connor Birch "[name]" start on amoxicillin-clavulanate. I am also having him maintain his polyethylene glycol, fluticasone, chlorthalidone, carvedilol, amLODipine, apixaban, atorvastatin, glimepiride, hydrALAZINE, lisinopril, isosorbide mononitrate,  levocetirizine, and benzonatate.  Allergies as of 05/23/2023   No Known Allergies      Medication List        Accurate as of May 23, 2023 11:59 PM. If you have any questions, ask your nurse or doctor.          allopurinol 100 MG tablet Commonly known as: ZYLOPRIM TAKE 1 TABLET 2 TO 3 TIMES A DAY FOR GOUT   amLODipine 5 MG tablet Commonly known as: NORVASC Take 1 tablet (5 mg total) by mouth daily. For blood pressure   amoxicillin-clavulanate 875-125 MG tablet Commonly known as: AUGMENTIN Take 1 tablet by mouth 2 (two) times daily for 20 days. Take all of this medication Started by: Broadus John Icarus Partch   apixaban  5 MG Tabs tablet Commonly known as: Eliquis Take 1 tablet (5 mg total) by mouth 2 (two) times daily.   atorvastatin 80 MG tablet Commonly known as: LIPITOR Take 1 tablet (80 mg total) by mouth daily.   benzonatate 100 MG capsule Commonly known as: Tessalon Perles Take 1 capsule (100 mg total) by mouth 3 (three) times daily as needed for cough.   carvedilol 25 MG tablet Commonly known as: COREG TAKE ONE TABLET TWICE DAILY   chlorthalidone 25 MG tablet Commonly known as: HYGROTON Take 25 mg by mouth every morning.   fluticasone 50 MCG/ACT nasal spray Commonly known as: FLONASE Place 2 sprays into both nostrils daily.   glimepiride 2 MG tablet Commonly known as: AMARYL TAKE ONE TABLET ONCE DAILY EVERY MORNING   hydrALAZINE 100 MG tablet Commonly known as: APRESOLINE Take 1 tablet (100 mg total) by mouth 3 (three) times daily.   isosorbide mononitrate 30 MG 24 hr tablet Commonly known as: IMDUR TAKE ONE TABLET BY MOUTH DAILY   levocetirizine 5 MG tablet Commonly known as: XYZAL TAKE ONE TABLET EVERY EVENING   lisinopril 20 MG tablet Commonly known as: ZESTRIL Take 1 tablet (20 mg total) by mouth daily.   polyethylene glycol 17 g packet Commonly known as: MIRALAX / GLYCOLAX Take 17 g by mouth daily.         Follow-up: Return in about 3  months (around 08/23/2023).  Mechele Claude, M.D.

## 2023-05-24 LAB — CMP14+EGFR
ALT: 27 IU/L (ref 0–44)
AST: 18 IU/L (ref 0–40)
Albumin: 3.8 g/dL (ref 3.8–4.8)
Alkaline Phosphatase: 81 IU/L (ref 44–121)
BUN/Creatinine Ratio: 26 — ABNORMAL HIGH (ref 10–24)
BUN: 43 mg/dL — ABNORMAL HIGH (ref 8–27)
Bilirubin Total: 0.4 mg/dL (ref 0.0–1.2)
CO2: 22 mmol/L (ref 20–29)
Calcium: 9 mg/dL (ref 8.6–10.2)
Chloride: 106 mmol/L (ref 96–106)
Creatinine, Ser: 1.65 mg/dL — ABNORMAL HIGH (ref 0.76–1.27)
Globulin, Total: 2.6 g/dL (ref 1.5–4.5)
Glucose: 75 mg/dL (ref 70–99)
Potassium: 3.7 mmol/L (ref 3.5–5.2)
Sodium: 144 mmol/L (ref 134–144)
Total Protein: 6.4 g/dL (ref 6.0–8.5)
eGFR: 42 mL/min/{1.73_m2} — ABNORMAL LOW (ref >59)

## 2023-05-24 LAB — CBC WITH DIFFERENTIAL/PLATELET
Basophils Absolute: 0 10*3/uL (ref 0.0–0.2)
Basos: 0 %
EOS (ABSOLUTE): 0.1 10*3/uL (ref 0.0–0.4)
Eos: 2 %
Hematocrit: 31.4 % — ABNORMAL LOW (ref 37.5–51.0)
Hemoglobin: 10.5 g/dL — ABNORMAL LOW (ref 13.0–17.7)
Immature Grans (Abs): 0.1 10*3/uL (ref 0.0–0.1)
Immature Granulocytes: 1 %
Lymphocytes Absolute: 1.8 10*3/uL (ref 0.7–3.1)
Lymphs: 20 %
MCH: 31.2 pg (ref 26.6–33.0)
MCHC: 33.4 g/dL (ref 31.5–35.7)
MCV: 93 fL (ref 79–97)
Monocytes Absolute: 0.8 10*3/uL (ref 0.1–0.9)
Monocytes: 9 %
Neutrophils Absolute: 6.2 10*3/uL (ref 1.4–7.0)
Neutrophils: 68 %
Platelets: 202 10*3/uL (ref 150–450)
RBC: 3.37 x10E6/uL — ABNORMAL LOW (ref 4.14–5.80)
RDW: 13.1 % (ref 11.6–15.4)
WBC: 8.9 10*3/uL (ref 3.4–10.8)

## 2023-05-24 LAB — LIPID PANEL
Chol/HDL Ratio: 1.8 ratio (ref 0.0–5.0)
Cholesterol, Total: 147 mg/dL (ref 100–199)
HDL: 81 mg/dL (ref >39)
LDL Chol Calc (NIH): 54 mg/dL (ref 0–99)
Triglycerides: 60 mg/dL (ref 0–149)
VLDL Cholesterol Cal: 12 mg/dL (ref 5–40)

## 2023-05-28 ENCOUNTER — Other Ambulatory Visit: Payer: Self-pay | Admitting: *Deleted

## 2023-05-28 DIAGNOSIS — D649 Anemia, unspecified: Secondary | ICD-10-CM

## 2023-06-03 ENCOUNTER — Other Ambulatory Visit: Payer: Self-pay | Admitting: Family Medicine

## 2023-06-03 DIAGNOSIS — E119 Type 2 diabetes mellitus without complications: Secondary | ICD-10-CM | POA: Insufficient documentation

## 2023-06-03 DIAGNOSIS — M1A9XX0 Chronic gout, unspecified, without tophus (tophi): Secondary | ICD-10-CM

## 2023-06-11 ENCOUNTER — Other Ambulatory Visit: Payer: Medicare Other

## 2023-06-11 ENCOUNTER — Other Ambulatory Visit (HOSPITAL_COMMUNITY): Payer: Self-pay | Admitting: Cardiology

## 2023-06-11 DIAGNOSIS — I6529 Occlusion and stenosis of unspecified carotid artery: Secondary | ICD-10-CM

## 2023-06-12 ENCOUNTER — Other Ambulatory Visit: Payer: Self-pay | Admitting: Nurse Practitioner

## 2023-06-12 DIAGNOSIS — J069 Acute upper respiratory infection, unspecified: Secondary | ICD-10-CM

## 2023-06-14 ENCOUNTER — Encounter: Payer: Self-pay | Admitting: Nurse Practitioner

## 2023-06-14 ENCOUNTER — Ambulatory Visit: Payer: Medicare Other | Admitting: Nurse Practitioner

## 2023-06-14 VITALS — BP 114/68 | HR 59 | Temp 97.5°F | Resp 20 | Ht 68.0 in | Wt 198.0 lb

## 2023-06-14 DIAGNOSIS — Z23 Encounter for immunization: Secondary | ICD-10-CM

## 2023-06-14 DIAGNOSIS — M0609 Rheumatoid arthritis without rheumatoid factor, multiple sites: Secondary | ICD-10-CM | POA: Diagnosis not present

## 2023-06-14 MED ORDER — PREDNISONE 20 MG PO TABS
40.0000 mg | ORAL_TABLET | Freq: Every day | ORAL | 0 refills | Status: AC
Start: 2023-06-14 — End: 2023-06-19

## 2023-06-14 NOTE — Progress Notes (Signed)
Subjective:    Patient ID: Connor Bryant, male    DOB: [birth year], 77 y.o.   MRN: [id number]   Chief Complaint: Arthritis (Wants prednisone called in for rheumatoid arthritis/)   Arthritis He complains of joint swelling. Pertinent negatives include no rash.    Patient come sin today c/o of arthritis flare up. Pain varies in different joints from day to day. He takes prednisone at times which helps. Would like refill. He says he takes 20mg  of prednisone daily. He only takes when he has a flare up.   Patient Active Problem List   Diagnosis Date Noted   Diabetes mellitus treated with oral medication (HCC) 06/03/2023   Non-ST elevation (NSTEMI) myocardial infarction [facility])    Acute systolic heart failure (HCC)    Elevated troponin 05/03/2020   Fever 05/03/2020   Positive D dimer 05/03/2020   PAF (paroxysmal atrial fibrillation) (HCC) 05/02/2020   S/P insertion of iliac artery stent 05/01/2020   Diabetic lipidosis (HCC) 03/10/2020   Intermittent claudication (HCC) 03/10/2020   Compression fracture of T12 vertebra (HCC) 10/27/2018   Spinal stenosis of lumbosacral region 10/27/2018   Small bowel obstruction (HCC) 09/30/2018   Back pain of lumbar region with sciatica 08/26/2018   Chronic left-sided low back pain with left-sided sciatica 08/26/2018   Tremor 08/26/2018   Pure hypercholesterolemia 02/25/2018   Atherosclerosis of aorta (HCC) 02/24/2018   Bilateral hip pain 06/24/2017   Body mass index 30.0-30.9, adult 06/06/2016   Gout 06/06/2016   Essential hypertension 06/06/2016   Chronic mesenteric ischemia (HCC) 03/01/2014   Peripheral vascular disease (HCC) 02/16/2014   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  Eyes:  Negative for pain.  Respiratory:  Negative for shortness of breath.   Cardiovascular:  Negative for chest pain,  palpitations and leg swelling.  Gastrointestinal:  Negative for abdominal pain.  Endocrine: Negative for polydipsia.  Musculoskeletal:  Positive for arthritis and joint swelling.  Skin:  Negative for rash.  Neurological:  Negative for dizziness, weakness and headaches.  Hematological:  Does not bruise/bleed easily.  All other systems reviewed and are negative.      Objective:   Physical Exam Vitals reviewed.  Constitutional:      Appearance: Normal appearance.  Cardiovascular:     Rate and Rhythm: Normal rate and regular rhythm.     Heart sounds: Normal heart sounds.  Pulmonary:     Effort: Pulmonary effort is normal.     Breath sounds: Normal breath sounds.  Musculoskeletal:     Comments: No joint swelling  Skin:    General: Skin is warm.  Neurological:     General: No focal deficit present.     Mental Status: He is alert and oriented to person, place, and time.  Psychiatric:        Mood and Affect: Mood normal.        Behavior: Behavior normal.     BP 114/68   Pulse (!) 59   Temp (!) 97.5 F (36.4 C) (Temporal)   Resp 20   Ht 5\' 8"  (1.727 m)   Wt 198 lb (89.8 kg)   SpO2 97%   BMI 30.11 kg/m        Assessment & Plan:   Connor Bryant in today with chief complaint of Arthritis (Wants prednisone called in for rheumatoid arthritis/)  1. Rheumatoid arthritis of multiple sites with negative rheumatoid factor (HCC) Keep appointment with rheumatology Rest RTO prn - predniSONE (DELTASONE) 20 MG tablet; Take 2 tablets (40 mg total) by mouth daily with breakfast for 5 days. 2 po daily for 5 days  Dispense: 10 tablet; Refill: 0    The above assessment and management plan was discussed with the patient. The patient verbalized understanding of and has agreed to the management plan. Patient is aware to call the clinic if symptoms persist or worsen. Patient is aware when to return to the clinic for a follow-up visit. Patient educated on when it is appropriate to go to  the emergency department.   Mary-Margaret Daphine Deutscher, FNP

## 2023-06-14 NOTE — Patient Instructions (Signed)
Rheumatoid Arthritis Rheumatoid arthritis (RA) is a long-term (chronic) disease that causes inflammation in the joints. RA may start slowly. It most often affects the small joints of the hands and feet. Usually, the same joints are affected on both sides of the body. Inflammation from RA can also affect other parts of the body, including the heart, eyes, or lungs. There is no cure for RA, but medicines can help your symptoms and stop or slow down the progression of the disease. What are the causes? RA is an autoimmune disease. When you have an autoimmune disease, your body's defense system (immune system) mistakenly attacks healthy body tissues. The exact cause of RA is not known. What increases the risk? The following factors may make you more likely to develop this condition: Being male. Having a family history of RA or other autoimmune diseases. Having a history of smoking. Being obese. Having been exposed to pollutants or chemicals. What are the signs or symptoms? Symptoms of this condition usually start gradually. They are often worse in the morning. The first symptom may be morning stiffness that lasts longer than 30 minutes. As RA progresses, symptoms may include: Pain, stiffness, swelling, warmth, and tenderness in joints on both sides of your body. Loss of energy. Loss of appetite. Weight loss. Low-grade fever. Dry eyes and dry mouth. Firm lumps (rheumatoid nodules) that grow beneath your skin in areas such as your forearm bones near your elbows and on your hands. Changes in the appearance of joints (deformity) and loss of joint function. Symptoms of this condition vary from person to person. Symptoms of RA often come and go. Sometimes, symptoms get worse for a period of time. These are called flares. How is this diagnosed? This condition is diagnosed based on your symptoms, medical history, and a physical exam. You may have X-rays or an MRI to check for the type of joint  changes that are caused by RA. You may also have blood tests to look for: Proteins (antibodies) that your immune system may make if you have RA. These include rheumatoid factor (RF) and anti-CCP. When blood tests show these proteins, you are said to have "seropositive RA." When blood tests do not show these proteins, you may have "seronegative RA." Inflammation in your blood. A low number of red blood cells (anemia). How is this treated? The goals of treatment are to relieve pain, reduce inflammation, and slow down or stop joint damage and disability. Treatment may include: Lifestyle changes. It is important to rest as needed, eat a healthy diet, and exercise. Medicines. Your health care provider may adjust your medicines every 3 months until treatment goals are reached. Common medicines include: Pain relievers (analgesics). Corticosteroids and NSAIDs, such as ibuprofen, to reduce inflammation. Disease-modifying antirheumatic drugs (DMARDs) to try to slow the course of the disease. Biologic response modifiers to reduce inflammation and damage. Physical therapy and occupational therapy. Surgery, if you have severe joint damage. Joint replacement or fusing of joints may be needed. Your health care provider will work with you to identify the best treatment option for you based on assessment of the overall disease activity in your body. Follow these instructions at home: Managing pain, stiffness, and swelling If directed, apply heat to the affected area as often as told by your health care provider. Use the heat source that your health care provider recommends, such as a moist heat pack or a heating pad. Place a towel between your skin and the heat source. Leave the heat on for  20-30 minutes. Remove the heat if your skin turns bright red. This is especially important if you are unable to feel pain, heat, or cold. You have a greater risk of getting burned.  Activity Return to your normal  activities as told by your health care provider. Ask your health care provider what activities are safe for you. Rest when you are having a flare. Start an exercise program as told by your health care provider. This may include physical therapy exercises to maintain movement and strength in your joints. General instructions Take over-the-counter and prescription medicines only as told by your health care provider. Keep all follow-up visits. This is important. Where to find more information [employer]: rheumatology.org Arthritis Foundation: arthritis.org Contact a health care provider if: You have a flare-up of RA symptoms. You have a fever. You have side effects from your medicines. Get help right away if: You have chest pain. You have trouble breathing. You quickly develop a hot, painful joint that is more severe than your usual joint aches. These symptoms may be an emergency. Get help right away. Call 911. Do not wait to see if the symptoms will go away. Do not drive yourself to the hospital. Summary Rheumatoid arthritis (RA) is a long-term (chronic) disease that causes inflammation in the joints. RA is an autoimmune disease. The goals of treatment are to relieve pain, reduce inflammation, and slow down or stop joint damage and disability. This information is not intended to replace advice given to you by your health care provider. Make sure you discuss any questions you have with your health care provider. Document Revised: 07/13/2021 Document Reviewed: 07/13/2021 Elsevier Patient Education  2024 [employer].

## 2023-06-14 NOTE — Addendum Note (Signed)
Addended by: Bennie Pierini on: 06/14/2023 11:09 AM   Modules accepted: Level of Service

## 2023-06-20 ENCOUNTER — Other Ambulatory Visit: Payer: Self-pay | Admitting: Nurse Practitioner

## 2023-06-20 DIAGNOSIS — M0609 Rheumatoid arthritis without rheumatoid factor, multiple sites: Secondary | ICD-10-CM

## 2023-06-25 ENCOUNTER — Ambulatory Visit: Payer: Medicare Other | Admitting: Family Medicine

## 2023-06-25 ENCOUNTER — Encounter: Payer: Self-pay | Admitting: Family Medicine

## 2023-06-25 VITALS — BP 125/71 | HR 65 | Temp 97.4°F | Ht 68.0 in | Wt 198.8 lb

## 2023-06-25 DIAGNOSIS — M25552 Pain in left hip: Secondary | ICD-10-CM

## 2023-06-25 DIAGNOSIS — E119 Type 2 diabetes mellitus without complications: Secondary | ICD-10-CM | POA: Diagnosis not present

## 2023-06-25 DIAGNOSIS — M25551 Pain in right hip: Secondary | ICD-10-CM | POA: Diagnosis not present

## 2023-06-25 DIAGNOSIS — Z7984 Long term (current) use of oral hypoglycemic drugs: Secondary | ICD-10-CM

## 2023-06-25 DIAGNOSIS — M0579 Rheumatoid arthritis with rheumatoid factor of multiple sites without organ or systems involvement: Secondary | ICD-10-CM

## 2023-06-25 DIAGNOSIS — M5440 Lumbago with sciatica, unspecified side: Secondary | ICD-10-CM

## 2023-06-25 MED ORDER — LANCET DEVICE MISC: 1.0000 | Freq: Three times a day (TID) | 0 refills | Status: AC

## 2023-06-25 MED ORDER — PREDNISONE 10 MG PO TABS: 10.0000 mg | ORAL_TABLET | Freq: Every day | ORAL | 2 refills | Status: DC

## 2023-06-25 MED ORDER — BLOOD GLUCOSE MONITORING SUPPL DEVI: 1.0000 | Freq: Three times a day (TID) | 0 refills | Status: AC

## 2023-06-25 MED ORDER — LANCETS MISC. MISC: 1.0000 | Freq: Three times a day (TID) | 0 refills | Status: AC

## 2023-06-25 MED ORDER — BLOOD GLUCOSE TEST VI STRP: 1.0000 | ORAL_STRIP | Freq: Three times a day (TID) | 5 refills | Status: DC

## 2023-06-25 NOTE — Progress Notes (Signed)
Subjective:  Patient ID: Connor Bryant, male    DOB: [birth year]  Age: 77 y.o. MRN: [id number]  CC: Arthritis   HPI TERIQUE SKENE presents for Increased arthrtis with pain from hips to knees. Couldn't stand. Had to hang onto chair railing to walk. Couldn't walk with a cane. Used a walker for 4 days and took 40 mg prednisone (20 bid). It helped a whole lot.  Came in for refill. Now taking 10 mg q AM. Now having only  some minor aching in the elbow Hips and knees are sore. Sister takes prn prednisone for her RA from specialist. She is also taking injectables. Pt. Doesn't understand why he cannot keep some prn prednisone on hand.He has had multiple A1c readings in the prediabetic range. Willing to monitor glucose if he can get the prednisone. Waiting for rheumatology evaluation in January.      06/25/2023    2:51 PM 06/25/2023    2:40 PM 06/14/2023   11:13 AM  Depression screen PHQ 2/9  Decreased Interest 1 0 0  Down, Depressed, Hopeless 0 0 0  PHQ - 2 Score 1 0 0  Altered sleeping 2  0  Tired, decreased energy 1  0  Change in appetite 0  0  Feeling bad or failure about yourself  0  0  Trouble concentrating 0  0  Moving slowly or fidgety/restless 0  0  Suicidal thoughts 0  0  PHQ-9 Score 4  0  Difficult doing work/chores Somewhat difficult  Not difficult at all    History Daly has a past medical history of Acute CVA (cerebrovascular accident) (HCC) (05/01/2020), Acute respiratory failure with hypoxia (HCC) (05/04/2020), Arthritis, DM type 2 (diabetes mellitus, type 2) (HCC), Endotracheally intubated, Gallstone, Heart murmur, HTN (hypertension), Hyperlipidemia, Personal history of colonic polyp-adenoma (11/24/2013), Renal insufficiency, and Thoracic spine fracture (HCC).   He has a past surgical history that includes Debridement tennis elbow (Right); Tonsillectomy and adenoidectomy; Colonoscopy; Esophagogastroduodenoscopy; Mesenteric artery bypass (N/A, 03/10/2014); abdominal aortagram (N/A,  02/23/2014); vascualr surgery; and Cataract extraction, bilateral.   His family history includes AAA (abdominal aortic aneurysm) in his mother; Bladder Cancer in his father; Cancer in his father; Coronary artery disease in his father; Diabetes in his father and mother; Heart attack in his father; Heart disease in his father and mother; Hyperlipidemia in his father and mother; Hypertension in his father and mother; Stroke in his father.He reports that he quit smoking about 13 years ago. His smoking use included cigarettes. He has never used smokeless tobacco. He reports current alcohol use of about 12.0 standard drinks of alcohol per week. He reports that he does not currently use drugs.    ROS Review of Systems  Constitutional:  Negative for fever.  Respiratory:  Negative for shortness of breath.   Cardiovascular:  Negative for chest pain.  Musculoskeletal:  Positive for arthralgias.  Skin:  Negative for rash.    Objective:  BP 125/71   Pulse 65   Temp (!) 97.4 F (36.3 C)   Ht 5\' 8"  (1.727 m)   Wt 198 lb 12.8 oz (90.2 kg)   SpO2 97%   BMI 30.23 kg/m   BP Readings from Last 3 Encounters:  06/25/23 125/71  06/14/23 114/68  05/23/23 (!) 111/57    Wt Readings from Last 3 Encounters:  06/25/23 198 lb 12.8 oz (90.2 kg)  06/14/23 198 lb (89.8 kg)  05/23/23 200 lb 3.2 oz (90.8 kg)     Physical Exam Vitals  reviewed.  Constitutional:      Appearance: He is well-developed.  HENT:     Head: Normocephalic and atraumatic.     Right Ear: External ear normal.     Left Ear: External ear normal.     Mouth/Throat:     Pharynx: No oropharyngeal exudate or posterior oropharyngeal erythema.  Eyes:     Pupils: Pupils are equal, round, and reactive to light.  Cardiovascular:     Rate and Rhythm: Normal rate and regular rhythm.     Heart sounds: No murmur heard. Pulmonary:     Effort: No respiratory distress.     Breath sounds: Normal breath sounds.  Musculoskeletal:        General:  Tenderness (at knees) present.     Cervical back: Normal range of motion and neck supple.  Neurological:     Mental Status: He is alert and oriented to person, place, and time.       Assessment & Plan:   Connor "Gabriel Bryant" was seen today for arthritis.  Diagnoses and all orders for this visit:  Rheumatoid arthritis involving multiple sites with positive rheumatoid factor (HCC)  Bilateral hip pain  Back pain of lumbar region with sciatica  Diabetes mellitus treated with oral medication (HCC)  Other orders -     Blood Glucose Monitoring Suppl DEVI; 1 each by Does not apply route in the morning, at noon, and at bedtime. May substitute to any manufacturer covered by patient's insurance. -     Glucose Blood (BLOOD GLUCOSE TEST STRIPS) STRP; 1 each by In Vitro route in the morning, at noon, and at bedtime. May substitute to any manufacturer covered by patient's insurance. -     Lancet Device MISC; 1 each by Does not apply route in the morning, at noon, and at bedtime. May substitute to any manufacturer covered by patient's insurance. -     Lancets Misc. MISC; 1 each by Does not apply route in the morning, at noon, and at bedtime. May substitute to any manufacturer covered by patient's insurance. -     predniSONE (DELTASONE) 10 MG tablet; Take 1 tablet (10 mg total) by mouth daily with breakfast.       I have discontinued [name]'s benzonatate. I am also having him start on Blood Glucose Monitoring Suppl, BLOOD GLUCOSE TEST STRIPS, Lancet Device, Lancets Misc., and predniSONE. Additionally, I am having him maintain his polyethylene glycol, fluticasone, chlorthalidone, carvedilol, amLODipine, apixaban, atorvastatin, glimepiride, hydrALAZINE, lisinopril, isosorbide mononitrate, levocetirizine, and allopurinol.  Allergies as of 06/25/2023   No Known Allergies      Medication List        Accurate as of June 25, 2023  8:40 PM. If you have any questions, ask your nurse or  doctor.          STOP taking these medications    benzonatate 100 MG capsule Commonly known as: Lawyer Stopped by: Kyrene Longan       TAKE these medications    allopurinol 100 MG tablet Commonly known as: ZYLOPRIM TAKE 1 TABLET 2 TO 3 TIMES A DAY FOR GOUT   amLODipine 5 MG tablet Commonly known as: NORVASC Take 1 tablet (5 mg total) by mouth daily. For blood pressure   apixaban 5 MG Tabs tablet Commonly known as: Eliquis Take 1 tablet (5 mg total) by mouth 2 (two) times daily.   atorvastatin 80 MG tablet Commonly known as: LIPITOR Take 1 tablet (80 mg total) by mouth daily.  Blood Glucose Monitoring Suppl Devi 1 each by Does not apply route in the morning, at noon, and at bedtime. May substitute to any manufacturer covered by patient's insurance. Started by: Shelby Peltz   BLOOD GLUCOSE TEST STRIPS Strp 1 each by In Vitro route in the morning, at noon, and at bedtime. May substitute to any manufacturer covered by patient's insurance. Started by: Samyra Limb   carvedilol 25 MG tablet Commonly known as: COREG TAKE ONE TABLET TWICE DAILY   chlorthalidone 25 MG tablet Commonly known as: HYGROTON Take 25 mg by mouth every morning.   fluticasone 50 MCG/ACT nasal spray Commonly known as: FLONASE Place 2 sprays into both nostrils daily.   glimepiride 2 MG tablet Commonly known as: AMARYL TAKE ONE TABLET ONCE DAILY EVERY MORNING   hydrALAZINE 100 MG tablet Commonly known as: APRESOLINE Take 1 tablet (100 mg total) by mouth 3 (three) times daily.   isosorbide mononitrate 30 MG 24 hr tablet Commonly known as: IMDUR TAKE ONE TABLET BY MOUTH DAILY   Lancet Device Misc 1 each by Does not apply route in the morning, at noon, and at bedtime. May substitute to any manufacturer covered by patient's insurance. Started by: Abreanna Drawdy   [employer] Misc 1 each by Does not apply route in the morning, at noon, and at bedtime. May substitute to any  manufacturer covered by patient's insurance. Started by: Zaveon Gillen   levocetirizine 5 MG tablet Commonly known as: XYZAL TAKE ONE TABLET EVERY EVENING   lisinopril 20 MG tablet Commonly known as: ZESTRIL Take 1 tablet (20 mg total) by mouth daily.   polyethylene glycol 17 g packet Commonly known as: MIRALAX / GLYCOLAX Take 17 g by mouth daily.   predniSONE 10 MG tablet Commonly known as: DELTASONE Take 1 tablet (10 mg total) by mouth daily with breakfast. Started by: Broadus John Kristopher Delk         Follow-up: Return in about 2 months (around 08/25/2023) for Arthritis, diabetes, hypertension.  Mechele Claude, M.D.

## 2023-07-02 ENCOUNTER — Other Ambulatory Visit: Payer: Self-pay

## 2023-07-02 DIAGNOSIS — E1122 Type 2 diabetes mellitus with diabetic chronic kidney disease: Secondary | ICD-10-CM

## 2023-07-04 ENCOUNTER — Other Ambulatory Visit: Payer: Self-pay | Admitting: Family Medicine

## 2023-07-04 ENCOUNTER — Telehealth: Payer: Self-pay | Admitting: Family Medicine

## 2023-07-04 MED ORDER — METHOTREXATE SODIUM 2.5 MG PO TABS: 7.5000 mg | ORAL_TABLET | ORAL | 0 refills | Status: DC

## 2023-07-04 NOTE — Telephone Encounter (Signed)
PATIENT AWARE

## 2023-07-04 NOTE — Telephone Encounter (Signed)
Pt called stating that he has been taking Prednisone to help with RA flare ups. Says the medication is helping a little but only in comparison to him not taking it; which would be him locked up completely and not able to move. Also says he is having issues with his fingers swelling.   Needs advise on what Dr Darlyn Read wants to do?

## 2023-07-04 NOTE — Telephone Encounter (Signed)
I sent in Methotrexate for him. It is a once a week drug, but is very effect for the whole week for most people. Recheck here in 1 month

## 2023-07-05 MED ORDER — METHOTREXATE SODIUM 2.5 MG PO TABS: 7.5000 mg | ORAL_TABLET | ORAL | 0 refills | Status: DC

## 2023-07-05 NOTE — Telephone Encounter (Signed)
Stew from [facility] pharmacy called to verify methotrexate rx. He was given methotrexate 2.5mg  tablets to take 3 tablets by mouth weekly #4. Patient was told that he needed to take 1 tablet weekly. Please verify. We did change quantity to 12 at pharmacy if you are wanting pt to take 3tablets weekly.

## 2023-07-10 MED ORDER — METHOTREXATE SODIUM 2.5 MG PO TABS: 7.5000 mg | ORAL_TABLET | ORAL | 0 refills | Status: DC

## 2023-07-10 NOTE — Telephone Encounter (Signed)
Patient aware of Stacks clarifications.   tacks, Broadus John, MD  You3 days ago    12 was correct. Thanks

## 2023-07-17 ENCOUNTER — Other Ambulatory Visit: Payer: Self-pay | Admitting: Family Medicine

## 2023-07-17 DIAGNOSIS — M1A9XX0 Chronic gout, unspecified, without tophus (tophi): Secondary | ICD-10-CM

## 2023-07-23 ENCOUNTER — Other Ambulatory Visit: Payer: Self-pay | Admitting: Cardiology

## 2023-08-01 ENCOUNTER — Encounter: Payer: Self-pay | Admitting: Family Medicine

## 2023-08-01 ENCOUNTER — Ambulatory Visit: Payer: Medicare Other | Admitting: Family Medicine

## 2023-08-01 VITALS — BP 107/68 | HR 98 | Temp 97.1°F | Ht 68.0 in | Wt 197.2 lb

## 2023-08-01 DIAGNOSIS — Z23 Encounter for immunization: Secondary | ICD-10-CM

## 2023-08-01 DIAGNOSIS — M0579 Rheumatoid arthritis with rheumatoid factor of multiple sites without organ or systems involvement: Secondary | ICD-10-CM

## 2023-08-01 MED ORDER — METHOTREXATE SODIUM 2.5 MG PO TABS: 10.0000 mg | ORAL_TABLET | ORAL | 0 refills | Status: DC

## 2023-08-01 MED ORDER — FOLIC ACID 1 MG PO TABS: 1.0000 mg | ORAL_TABLET | Freq: Every day | ORAL | 3 refills | Status: DC

## 2023-08-01 MED ORDER — PYRIDOXINE HCL 50 MG PO TABS: 50.0000 mg | ORAL_TABLET | Freq: Every day | ORAL | 1 refills | Status: DC

## 2023-08-01 NOTE — Progress Notes (Signed)
Subjective:  Patient ID: Connor Bryant, male    DOB: 30-May-1946  Age: 77 y.o. MRN: [id number]  CC: Medical Management of Chronic Issues   HPI Connor Bryant presents for Continued pain in multiple joints of the hands, knees, shoulders, hips and others. He has an appointment with rheumatology, but it is still almost 2 months away. He uses prednisone prn.      08/01/2023    4:02 PM 08/01/2023    3:40 PM 06/25/2023    2:51 PM  Depression screen PHQ 2/9  Decreased Interest 1 0 1  Down, Depressed, Hopeless 1 0 0  PHQ - 2 Score 2 0 1  Altered sleeping 0  2  Tired, decreased energy 1  1  Change in appetite 0  0  Feeling bad or failure about yourself  0  0  Trouble concentrating 0  0  Moving slowly or fidgety/restless 0  0  Suicidal thoughts 0  0  PHQ-9 Score 3  4  Difficult doing work/chores Somewhat difficult  Somewhat difficult    History Connor Bryant has a past medical history of Acute CVA (cerebrovascular accident) (HCC) (05/01/2020), Acute respiratory failure with hypoxia (HCC) (05/04/2020), Arthritis, DM type 2 (diabetes mellitus, type 2) (HCC), Endotracheally intubated, Gallstone, Heart murmur, HTN (hypertension), Hyperlipidemia, Personal history of colonic polyp-adenoma (11/24/2013), Renal insufficiency, and Thoracic spine fracture (HCC).   He has a past surgical history that includes Debridement tennis elbow (Right); Tonsillectomy and adenoidectomy; Colonoscopy; Esophagogastroduodenoscopy; Mesenteric artery bypass (N/A, 03/10/2014); abdominal aortagram (N/A, 02/23/2014); vascualr surgery; and Cataract extraction, bilateral.   His family history includes AAA (abdominal aortic aneurysm) in his mother; Bladder Cancer in his father; Cancer in his father; Coronary artery disease in his father; Diabetes in his father and mother; Heart attack in his father; Heart disease in his father and mother; Hyperlipidemia in his father and mother; Hypertension in his father and mother; Stroke in his father.He  reports that he quit smoking about 14 years ago. His smoking use included cigarettes. He has never used smokeless tobacco. He reports current alcohol use of about 12.0 standard drinks of alcohol per week. He reports that he does not currently use drugs.    ROS Review of Systems  Constitutional:  Negative for fever.  Respiratory:  Negative for shortness of breath.   Cardiovascular:  Negative for chest pain.  Musculoskeletal:  Negative for arthralgias.  Skin:  Negative for rash.    Objective:  BP 107/68   Pulse 98   Temp (!) 97.1 F (36.2 C)   Ht 5\' 8"  (1.727 m)   Wt 197 lb 3.7 oz (89.5 kg)   SpO2 97%   BMI 29.99 kg/m   BP Readings from Last 3 Encounters:  08/01/23 107/68  06/25/23 125/71  06/14/23 114/68    Wt Readings from Last 3 Encounters:  08/01/23 197 lb 3.7 oz (89.5 kg)  06/25/23 198 lb 12.8 oz (90.2 kg)  06/14/23 198 lb (89.8 kg)     Physical Exam Vitals reviewed.  Constitutional:      Appearance: He is well-developed.  HENT:     Head: Normocephalic and atraumatic.     Right Ear: External ear normal.     Left Ear: External ear normal.     Mouth/Throat:     Pharynx: No oropharyngeal exudate or posterior oropharyngeal erythema.  Eyes:     Pupils: Pupils are equal, round, and reactive to light.  Cardiovascular:     Rate and Rhythm: Normal rate and regular  rhythm.     Heart sounds: No murmur heard. Pulmonary:     Effort: No respiratory distress.     Breath sounds: Normal breath sounds.  Musculoskeletal:     Cervical back: Normal range of motion and neck supple.  Neurological:     Mental Status: He is alert and oriented to person, place, and time.       Assessment & Plan:   Connor Bryant" was seen today for medical management of chronic issues.  Diagnoses and all orders for this visit:  Rheumatoid arthritis involving multiple sites with positive rheumatoid factor (HCC) -     CMP14+EGFR  Need for Tdap vaccination -     Tdap vaccine greater than  or equal to 7yo IM  Other orders -     methotrexate (RHEUMATREX) 2.5 MG tablet; Take 4 tablets (10 mg total) by mouth once a week. Caution:Chemotherapy. Protect from light. -     folic acid (FOLVITE) 1 MG tablet; Take 1 tablet (1 mg total) by mouth daily. -     pyridOXINE (B-6) 50 MG tablet; Take 1 tablet (50 mg total) by mouth daily.       I have changed Connor Birch "Connor Bryant"'s methotrexate. I am also having him start on folic acid and pyridOXINE. Additionally, I am having him maintain his polyethylene glycol, fluticasone, chlorthalidone, amLODipine, apixaban, atorvastatin, glimepiride, hydrALAZINE, lisinopril, isosorbide mononitrate, levocetirizine, Blood Glucose Monitoring Suppl, BLOOD GLUCOSE TEST STRIPS, predniSONE, allopurinol, and carvedilol.  Allergies as of 08/01/2023   No Known Allergies      Medication List        Accurate as of August 01, 2023 11:59 PM. If you have any questions, ask your nurse or doctor.          allopurinol 100 MG tablet Commonly known as: ZYLOPRIM TAKE 1 TABLET 2 TO 3 TIMES A DAY FOR GOUT   amLODipine 5 MG tablet Commonly known as: NORVASC Take 1 tablet (5 mg total) by mouth daily. For blood pressure   apixaban 5 MG Tabs tablet Commonly known as: Eliquis Take 1 tablet (5 mg total) by mouth 2 (two) times daily.   atorvastatin 80 MG tablet Commonly known as: LIPITOR Take 1 tablet (80 mg total) by mouth daily.   Blood Glucose Monitoring Suppl Devi 1 each by Does not apply route in the morning, at noon, and at bedtime. May substitute to any manufacturer covered by patient's insurance.   BLOOD GLUCOSE TEST STRIPS Strp 1 each by In Vitro route in the morning, at noon, and at bedtime. May substitute to any manufacturer covered by patient's insurance.   carvedilol 25 MG tablet Commonly known as: COREG TAKE ONE TABLET TWICE DAILY   chlorthalidone 25 MG tablet Commonly known as: HYGROTON Take 25 mg by mouth every morning.   fluticasone  50 MCG/ACT nasal spray Commonly known as: FLONASE Place 2 sprays into both nostrils daily.   folic acid 1 MG tablet Commonly known as: FOLVITE Take 1 tablet (1 mg total) by mouth daily. Started by: Azell Bill   glimepiride 2 MG tablet Commonly known as: AMARYL TAKE ONE TABLET ONCE DAILY EVERY MORNING   hydrALAZINE 100 MG tablet Commonly known as: APRESOLINE Take 1 tablet (100 mg total) by mouth 3 (three) times daily.   isosorbide mononitrate 30 MG 24 hr tablet Commonly known as: IMDUR TAKE ONE TABLET BY MOUTH DAILY   levocetirizine 5 MG tablet Commonly known as: XYZAL TAKE ONE TABLET EVERY EVENING   lisinopril 20 MG  tablet Commonly known as: ZESTRIL Take 1 tablet (20 mg total) by mouth daily.   methotrexate 2.5 MG tablet Commonly known as: RHEUMATREX Take 4 tablets (10 mg total) by mouth once a week. Caution:Chemotherapy. Protect from light. What changed: how much to take Changed by: Mikinzie Maciejewski   polyethylene glycol 17 g packet Commonly known as: MIRALAX / GLYCOLAX Take 17 g by mouth daily.   predniSONE 10 MG tablet Commonly known as: DELTASONE Take 1 tablet (10 mg total) by mouth daily with breakfast.   pyridOXINE 50 MG tablet Commonly known as: B-6 Take 1 tablet (50 mg total) by mouth daily. Started by: Broadus John Nykayla Marcelli         Follow-up: Return in about 1 month (around 08/31/2023), or if symptoms worsen or fail to improve.  Mechele Claude, M.D.

## 2023-08-04 ENCOUNTER — Encounter: Payer: Self-pay | Admitting: Family Medicine

## 2023-08-13 ENCOUNTER — Telehealth: Payer: Self-pay | Admitting: *Deleted

## 2023-08-13 NOTE — Telephone Encounter (Signed)
PATIENT AWARE

## 2023-08-13 NOTE — Telephone Encounter (Signed)
Copied from CRM [id number]. Topic: Clinical - Medical Advice >> Aug 13, 2023 11:06 AM Theodis Sato wrote: PT states that the Methotrexate dosage he is on is causing him weakness and dizzy spells, PT is concerned that his dosage is to high and states he can can hardly function or hold himself up. PT asks that Dr.Stacks will give him a call for advise.

## 2023-08-13 NOTE — Telephone Encounter (Signed)
Try cutting dose by 1/2

## 2023-08-14 ENCOUNTER — Telehealth: Payer: Self-pay | Admitting: *Deleted

## 2023-08-14 NOTE — Telephone Encounter (Signed)
Can we see him tomorrow

## 2023-08-14 NOTE — Telephone Encounter (Signed)
Copied from CRM [id number]. Topic: Clinical - Medical Advice >> Aug 14, 2023 11:06 AM Theodis Sato wrote: Reason for CRM: PT wants to let Dr. Darlyn Read know that he appreciates the call yesterday but is still feeling very weak and dizzy. I offered to send him to nurse triage line but he declined.

## 2023-08-15 NOTE — Telephone Encounter (Signed)
Schedule is full. Offered appointment tomorrow with DOD and patient declined. Accepted visit with PCP for Monday afternoon.

## 2023-08-17 ENCOUNTER — Other Ambulatory Visit: Payer: Self-pay

## 2023-08-17 ENCOUNTER — Encounter (HOSPITAL_COMMUNITY): Payer: Self-pay

## 2023-08-17 ENCOUNTER — Inpatient Hospital Stay (HOSPITAL_COMMUNITY): Payer: Medicare Other

## 2023-08-17 ENCOUNTER — Emergency Department (HOSPITAL_COMMUNITY): Payer: Medicare Other

## 2023-08-17 ENCOUNTER — Inpatient Hospital Stay (HOSPITAL_COMMUNITY)
Admission: EM | Admit: 2023-08-17 | Discharge: 2023-08-23 | DRG: 871 | Disposition: A | Discharge status: Home Health | Payer: Medicare Other | Attending: Internal Medicine | Admitting: Internal Medicine

## 2023-08-17 DIAGNOSIS — R404 Transient alteration of awareness: Secondary | ICD-10-CM | POA: Diagnosis not present

## 2023-08-17 DIAGNOSIS — D509 Iron deficiency anemia, unspecified: Secondary | ICD-10-CM | POA: Diagnosis not present

## 2023-08-17 DIAGNOSIS — D649 Anemia, unspecified: Secondary | ICD-10-CM

## 2023-08-17 DIAGNOSIS — M1990 Unspecified osteoarthritis, unspecified site: Secondary | ICD-10-CM | POA: Diagnosis present

## 2023-08-17 DIAGNOSIS — R0989 Other specified symptoms and signs involving the circulatory and respiratory systems: Secondary | ICD-10-CM | POA: Diagnosis not present

## 2023-08-17 DIAGNOSIS — K3189 Other diseases of stomach and duodenum: Secondary | ICD-10-CM | POA: Diagnosis present

## 2023-08-17 DIAGNOSIS — S42002A Fracture of unspecified part of left clavicle, initial encounter for closed fracture: Secondary | ICD-10-CM | POA: Diagnosis not present

## 2023-08-17 DIAGNOSIS — E1151 Type 2 diabetes mellitus with diabetic peripheral angiopathy without gangrene: Secondary | ICD-10-CM | POA: Diagnosis not present

## 2023-08-17 DIAGNOSIS — I4891 Unspecified atrial fibrillation: Secondary | ICD-10-CM | POA: Diagnosis not present

## 2023-08-17 DIAGNOSIS — J9801 Acute bronchospasm: Secondary | ICD-10-CM | POA: Diagnosis present

## 2023-08-17 DIAGNOSIS — E8809 Other disorders of plasma-protein metabolism, not elsewhere classified: Secondary | ICD-10-CM | POA: Diagnosis not present

## 2023-08-17 DIAGNOSIS — Z743 Need for continuous supervision: Secondary | ICD-10-CM | POA: Diagnosis not present

## 2023-08-17 DIAGNOSIS — D689 Coagulation defect, unspecified: Secondary | ICD-10-CM | POA: Diagnosis not present

## 2023-08-17 DIAGNOSIS — D62 Acute posthemorrhagic anemia: Secondary | ICD-10-CM | POA: Diagnosis present

## 2023-08-17 DIAGNOSIS — I739 Peripheral vascular disease, unspecified: Secondary | ICD-10-CM | POA: Diagnosis not present

## 2023-08-17 DIAGNOSIS — J9601 Acute respiratory failure with hypoxia: Secondary | ICD-10-CM | POA: Diagnosis not present

## 2023-08-17 DIAGNOSIS — Z833 Family history of diabetes mellitus: Secondary | ICD-10-CM

## 2023-08-17 DIAGNOSIS — I2489 Other forms of acute ischemic heart disease: Secondary | ICD-10-CM | POA: Diagnosis present

## 2023-08-17 DIAGNOSIS — Z8673 Personal history of transient ischemic attack (TIA), and cerebral infarction without residual deficits: Secondary | ICD-10-CM

## 2023-08-17 DIAGNOSIS — Z9841 Cataract extraction status, right eye: Secondary | ICD-10-CM

## 2023-08-17 DIAGNOSIS — I080 Rheumatic disorders of both mitral and aortic valves: Secondary | ICD-10-CM | POA: Diagnosis present

## 2023-08-17 DIAGNOSIS — R0609 Other forms of dyspnea: Secondary | ICD-10-CM | POA: Diagnosis not present

## 2023-08-17 DIAGNOSIS — Z7901 Long term (current) use of anticoagulants: Secondary | ICD-10-CM | POA: Diagnosis not present

## 2023-08-17 DIAGNOSIS — N1831 Chronic kidney disease, stage 3a: Secondary | ICD-10-CM | POA: Diagnosis not present

## 2023-08-17 DIAGNOSIS — Z83438 Family history of other disorder of lipoprotein metabolism and other lipidemia: Secondary | ICD-10-CM

## 2023-08-17 DIAGNOSIS — Z91199 Patient's noncompliance with other medical treatment and regimen due to unspecified reason: Secondary | ICD-10-CM

## 2023-08-17 DIAGNOSIS — J90 Pleural effusion, not elsewhere classified: Secondary | ICD-10-CM | POA: Diagnosis not present

## 2023-08-17 DIAGNOSIS — K2289 Other specified disease of esophagus: Secondary | ICD-10-CM | POA: Diagnosis not present

## 2023-08-17 DIAGNOSIS — M1A9XX0 Chronic gout, unspecified, without tophus (tophi): Secondary | ICD-10-CM | POA: Diagnosis present

## 2023-08-17 DIAGNOSIS — K922 Gastrointestinal hemorrhage, unspecified: Secondary | ICD-10-CM | POA: Diagnosis not present

## 2023-08-17 DIAGNOSIS — R578 Other shock: Principal | ICD-10-CM | POA: Diagnosis present

## 2023-08-17 DIAGNOSIS — I4819 Other persistent atrial fibrillation: Secondary | ICD-10-CM | POA: Diagnosis not present

## 2023-08-17 DIAGNOSIS — I480 Paroxysmal atrial fibrillation: Secondary | ICD-10-CM | POA: Diagnosis not present

## 2023-08-17 DIAGNOSIS — K2961 Other gastritis with bleeding: Secondary | ICD-10-CM | POA: Diagnosis not present

## 2023-08-17 DIAGNOSIS — R0603 Acute respiratory distress: Secondary | ICD-10-CM

## 2023-08-17 DIAGNOSIS — J9811 Atelectasis: Secondary | ICD-10-CM | POA: Diagnosis not present

## 2023-08-17 DIAGNOSIS — N179 Acute kidney failure, unspecified: Secondary | ICD-10-CM | POA: Diagnosis not present

## 2023-08-17 DIAGNOSIS — K319 Disease of stomach and duodenum, unspecified: Secondary | ICD-10-CM | POA: Diagnosis not present

## 2023-08-17 DIAGNOSIS — R0689 Other abnormalities of breathing: Secondary | ICD-10-CM | POA: Diagnosis not present

## 2023-08-17 DIAGNOSIS — I5020 Unspecified systolic (congestive) heart failure: Secondary | ICD-10-CM

## 2023-08-17 DIAGNOSIS — K2970 Gastritis, unspecified, without bleeding: Secondary | ICD-10-CM | POA: Diagnosis not present

## 2023-08-17 DIAGNOSIS — I130 Hypertensive heart and chronic kidney disease with heart failure and stage 1 through stage 4 chronic kidney disease, or unspecified chronic kidney disease: Secondary | ICD-10-CM | POA: Diagnosis not present

## 2023-08-17 DIAGNOSIS — I959 Hypotension, unspecified: Secondary | ICD-10-CM

## 2023-08-17 DIAGNOSIS — R918 Other nonspecific abnormal finding of lung field: Secondary | ICD-10-CM | POA: Diagnosis not present

## 2023-08-17 DIAGNOSIS — R5381 Other malaise: Secondary | ICD-10-CM | POA: Diagnosis present

## 2023-08-17 DIAGNOSIS — I350 Nonrheumatic aortic (valve) stenosis: Secondary | ICD-10-CM | POA: Diagnosis not present

## 2023-08-17 DIAGNOSIS — E669 Obesity, unspecified: Secondary | ICD-10-CM | POA: Diagnosis present

## 2023-08-17 DIAGNOSIS — I5023 Acute on chronic systolic (congestive) heart failure: Secondary | ICD-10-CM | POA: Diagnosis not present

## 2023-08-17 DIAGNOSIS — E861 Hypovolemia: Secondary | ICD-10-CM | POA: Diagnosis present

## 2023-08-17 DIAGNOSIS — E785 Hyperlipidemia, unspecified: Secondary | ICD-10-CM | POA: Diagnosis not present

## 2023-08-17 DIAGNOSIS — E1122 Type 2 diabetes mellitus with diabetic chronic kidney disease: Secondary | ICD-10-CM | POA: Diagnosis not present

## 2023-08-17 DIAGNOSIS — R7989 Other specified abnormal findings of blood chemistry: Secondary | ICD-10-CM

## 2023-08-17 DIAGNOSIS — K2950 Unspecified chronic gastritis without bleeding: Secondary | ICD-10-CM | POA: Diagnosis not present

## 2023-08-17 DIAGNOSIS — E876 Hypokalemia: Secondary | ICD-10-CM | POA: Diagnosis present

## 2023-08-17 DIAGNOSIS — Z86711 Personal history of pulmonary embolism: Secondary | ICD-10-CM

## 2023-08-17 DIAGNOSIS — I5030 Unspecified diastolic (congestive) heart failure: Secondary | ICD-10-CM | POA: Diagnosis not present

## 2023-08-17 DIAGNOSIS — I252 Old myocardial infarction: Secondary | ICD-10-CM

## 2023-08-17 DIAGNOSIS — I517 Cardiomegaly: Secondary | ICD-10-CM | POA: Diagnosis not present

## 2023-08-17 DIAGNOSIS — J9859 Other diseases of mediastinum, not elsewhere classified: Secondary | ICD-10-CM | POA: Diagnosis not present

## 2023-08-17 DIAGNOSIS — I10 Essential (primary) hypertension: Secondary | ICD-10-CM | POA: Diagnosis not present

## 2023-08-17 DIAGNOSIS — R0600 Dyspnea, unspecified: Secondary | ICD-10-CM | POA: Diagnosis not present

## 2023-08-17 DIAGNOSIS — Z66 Do not resuscitate: Secondary | ICD-10-CM | POA: Diagnosis not present

## 2023-08-17 DIAGNOSIS — Z9842 Cataract extraction status, left eye: Secondary | ICD-10-CM

## 2023-08-17 DIAGNOSIS — M069 Rheumatoid arthritis, unspecified: Secondary | ICD-10-CM | POA: Diagnosis present

## 2023-08-17 DIAGNOSIS — R000 Tachycardia, unspecified: Secondary | ICD-10-CM | POA: Diagnosis not present

## 2023-08-17 DIAGNOSIS — Z7984 Long term (current) use of oral hypoglycemic drugs: Secondary | ICD-10-CM

## 2023-08-17 DIAGNOSIS — I9589 Other hypotension: Secondary | ICD-10-CM | POA: Diagnosis not present

## 2023-08-17 DIAGNOSIS — Z8052 Family history of malignant neoplasm of bladder: Secondary | ICD-10-CM

## 2023-08-17 DIAGNOSIS — Z8249 Family history of ischemic heart disease and other diseases of the circulatory system: Secondary | ICD-10-CM

## 2023-08-17 DIAGNOSIS — R6889 Other general symptoms and signs: Secondary | ICD-10-CM | POA: Diagnosis not present

## 2023-08-17 DIAGNOSIS — Z7952 Long term (current) use of systemic steroids: Secondary | ICD-10-CM

## 2023-08-17 DIAGNOSIS — Z6830 Body mass index (BMI) 30.0-30.9, adult: Secondary | ICD-10-CM

## 2023-08-17 DIAGNOSIS — I2510 Atherosclerotic heart disease of native coronary artery without angina pectoris: Secondary | ICD-10-CM | POA: Diagnosis present

## 2023-08-17 DIAGNOSIS — R195 Other fecal abnormalities: Secondary | ICD-10-CM | POA: Diagnosis not present

## 2023-08-17 DIAGNOSIS — R0602 Shortness of breath: Secondary | ICD-10-CM | POA: Diagnosis not present

## 2023-08-17 DIAGNOSIS — Z860100 Personal history of colon polyps, unspecified: Secondary | ICD-10-CM

## 2023-08-17 DIAGNOSIS — Z79631 Long term (current) use of antimetabolite agent: Secondary | ICD-10-CM

## 2023-08-17 DIAGNOSIS — Z823 Family history of stroke: Secondary | ICD-10-CM

## 2023-08-17 DIAGNOSIS — E119 Type 2 diabetes mellitus without complications: Secondary | ICD-10-CM | POA: Diagnosis not present

## 2023-08-17 DIAGNOSIS — I429 Cardiomyopathy, unspecified: Secondary | ICD-10-CM | POA: Diagnosis not present

## 2023-08-17 DIAGNOSIS — I499 Cardiac arrhythmia, unspecified: Secondary | ICD-10-CM | POA: Diagnosis not present

## 2023-08-17 DIAGNOSIS — R17 Unspecified jaundice: Secondary | ICD-10-CM | POA: Diagnosis not present

## 2023-08-17 DIAGNOSIS — Z87891 Personal history of nicotine dependence: Secondary | ICD-10-CM

## 2023-08-17 DIAGNOSIS — Z79899 Other long term (current) drug therapy: Secondary | ICD-10-CM

## 2023-08-17 DIAGNOSIS — I700 Atherosclerosis of aorta: Secondary | ICD-10-CM | POA: Diagnosis not present

## 2023-08-17 DIAGNOSIS — K259 Gastric ulcer, unspecified as acute or chronic, without hemorrhage or perforation: Secondary | ICD-10-CM | POA: Diagnosis not present

## 2023-08-17 LAB — CBC
HCT: 23.3 % — ABNORMAL LOW (ref 39.0–52.0)
Hemoglobin: 7.5 g/dL — ABNORMAL LOW (ref 13.0–17.0)
MCH: 31.6 pg (ref 26.0–34.0)
MCHC: 32.2 g/dL (ref 30.0–36.0)
MCV: 98.3 fL (ref 80.0–100.0)
Platelets: 141 10*3/uL — ABNORMAL LOW (ref 150–400)
RBC: 2.37 MIL/uL — ABNORMAL LOW (ref 4.22–5.81)
RDW: 17.3 % — ABNORMAL HIGH (ref 11.5–15.5)
WBC: 6 10*3/uL (ref 4.0–10.5)
nRBC: 0 % (ref 0.0–0.2)

## 2023-08-17 LAB — BASIC METABOLIC PANEL
Anion gap: 10 (ref 5–15)
BUN: 70 mg/dL — ABNORMAL HIGH (ref 8–23)
CO2: 23 mmol/L (ref 22–32)
Calcium: 9.6 mg/dL (ref 8.9–10.3)
Chloride: 103 mmol/L (ref 98–111)
Creatinine, Ser: 2.39 mg/dL — ABNORMAL HIGH (ref 0.61–1.24)
GFR, Estimated: 27 mL/min — ABNORMAL LOW (ref >60)
Glucose, Bld: 137 mg/dL — ABNORMAL HIGH (ref 70–99)
Potassium: 4 mmol/L (ref 3.5–5.1)
Sodium: 136 mmol/L (ref 135–145)

## 2023-08-17 LAB — POCT I-STAT 7, (LYTES, BLD GAS, ICA,H+H)
Acid-base deficit: 6 mmol/L — ABNORMAL HIGH (ref 0.0–2.0)
Bicarbonate: 19.7 mmol/L — ABNORMAL LOW (ref 20.0–28.0)
Calcium, Ion: 1.22 mmol/L (ref 1.15–1.40)
HCT: 28 % — ABNORMAL LOW (ref 39.0–52.0)
Hemoglobin: 9.5 g/dL — ABNORMAL LOW (ref 13.0–17.0)
O2 Saturation: 99 %
Patient temperature: 98.6
Potassium: 4.2 mmol/L (ref 3.5–5.1)
Sodium: 137 mmol/L (ref 135–145)
TCO2: 21 mmol/L — ABNORMAL LOW (ref 22–32)
pCO2 arterial: 36.9 mm[Hg] (ref 32–48)
pH, Arterial: 7.336 — ABNORMAL LOW (ref 7.35–7.45)
pO2, Arterial: 121 mm[Hg] — ABNORMAL HIGH (ref 83–108)

## 2023-08-17 LAB — PROTIME-INR
INR: 2.1 — ABNORMAL HIGH (ref 0.8–1.2)
Prothrombin Time: 23.6 s — ABNORMAL HIGH (ref 11.4–15.2)

## 2023-08-17 LAB — HEMOGLOBIN A1C
Hgb A1c MFr Bld: 6.8 % — ABNORMAL HIGH (ref 4.8–5.6)
Mean Plasma Glucose: 148.46 mg/dL

## 2023-08-17 LAB — HEPATIC FUNCTION PANEL
ALT: 28 U/L (ref 0–44)
AST: 30 U/L (ref 15–41)
Albumin: 3.2 g/dL — ABNORMAL LOW (ref 3.5–5.0)
Alkaline Phosphatase: 51 U/L (ref 38–126)
Bilirubin, Direct: 0.4 mg/dL — ABNORMAL HIGH (ref 0.0–0.2)
Indirect Bilirubin: 0.8 mg/dL (ref 0.3–0.9)
Total Bilirubin: 1.2 mg/dL — ABNORMAL HIGH (ref <1.2)
Total Protein: 6.2 g/dL — ABNORMAL LOW (ref 6.5–8.1)

## 2023-08-17 LAB — GLUCOSE, CAPILLARY
Glucose-Capillary: 195 mg/dL — ABNORMAL HIGH (ref 70–99)
Glucose-Capillary: 167 mg/dL — ABNORMAL HIGH (ref 70–99)

## 2023-08-17 LAB — MRSA NEXT GEN BY PCR, NASAL: MRSA by PCR Next Gen: NOT DETECTED

## 2023-08-17 LAB — TROPONIN I (HIGH SENSITIVITY)
Troponin I (High Sensitivity): 546 ng/L (ref <18)
Troponin I (High Sensitivity): 461 ng/L (ref <18)
Troponin I (High Sensitivity): 558 ng/L (ref <18)

## 2023-08-17 LAB — POC OCCULT BLOOD, ED: Fecal Occult Bld: POSITIVE — AB

## 2023-08-17 LAB — HEMOGLOBIN AND HEMATOCRIT, BLOOD
HCT: 28.4 % — ABNORMAL LOW (ref 39.0–52.0)
Hemoglobin: 9.5 g/dL — ABNORMAL LOW (ref 13.0–17.0)

## 2023-08-17 LAB — PREPARE RBC (CROSSMATCH)

## 2023-08-17 LAB — LACTIC ACID, PLASMA: Lactic Acid, Venous: 1.7 mmol/L (ref 0.5–1.9)

## 2023-08-17 MED ORDER — DOCUSATE SODIUM 100 MG PO CAPS: 100.0000 mg | ORAL_CAPSULE | Freq: Two times a day (BID) | ORAL | Status: DC | PRN

## 2023-08-17 MED ORDER — PANTOPRAZOLE SODIUM 40 MG IV SOLR: 40.0000 mg | Freq: Two times a day (BID) | INTRAVENOUS | Status: DC

## 2023-08-17 MED ORDER — SODIUM CHLORIDE 0.9 % IV SOLN
Freq: Once | INTRAVENOUS | Status: AC
Start: 2023-08-17 — End: 2023-08-17

## 2023-08-17 MED ORDER — ORAL CARE MOUTH RINSE
15.0000 mL | OROMUCOSAL | Status: DC | PRN
Start: 2023-08-17 — End: 2023-08-18

## 2023-08-17 MED ORDER — PROTHROMBIN COMPLEX CONC HUMAN 500 UNITS IV KIT
4291.0000 [IU] | PACK | Status: AC
  Administered 2023-08-17: 4291 [IU] via INTRAVENOUS
  Filled 2023-08-17: qty 4291

## 2023-08-17 MED ORDER — ALLOPURINOL 100 MG PO TABS
100.0000 mg | ORAL_TABLET | Freq: Every day | ORAL | Status: DC
  Administered 2023-08-18 – 2023-08-23 (×5): 100 mg via ORAL
  Filled 2023-08-17 (×5): qty 1

## 2023-08-17 MED ORDER — LEVALBUTEROL HCL 0.63 MG/3ML IN NEBU: 0.6300 mg | INHALATION_SOLUTION | RESPIRATORY_TRACT | Status: DC | PRN

## 2023-08-17 MED ORDER — INSULIN ASPART 100 UNIT/ML IJ SOLN
0.0000 [IU] | INTRAMUSCULAR | Status: DC
  Administered 2023-08-17 (×2): 3 [IU] via SUBCUTANEOUS
  Administered 2023-08-18: 2 [IU] via SUBCUTANEOUS

## 2023-08-17 MED ORDER — PANTOPRAZOLE SODIUM 40 MG IV SOLR
40.0000 mg | Freq: Once | INTRAVENOUS | Status: AC
  Administered 2023-08-17: 40 mg via INTRAVENOUS
  Filled 2023-08-17: qty 10

## 2023-08-17 MED ORDER — SODIUM CHLORIDE 0.9% IV SOLUTION: Freq: Once | INTRAVENOUS | Status: AC

## 2023-08-17 MED ORDER — LACTATED RINGERS IV BOLUS: 1000.0000 mL | Freq: Once | INTRAVENOUS | Status: DC

## 2023-08-17 MED ORDER — PANTOPRAZOLE SODIUM 40 MG IV SOLR
40.0000 mg | Freq: Four times a day (QID) | INTRAVENOUS | Status: DC
  Administered 2023-08-17 – 2023-08-18 (×3): 40 mg via INTRAVENOUS
  Filled 2023-08-17 (×3): qty 10

## 2023-08-17 MED ORDER — POLYETHYLENE GLYCOL 3350 17 G PO PACK: 17.0000 g | PACK | Freq: Every day | ORAL | Status: DC | PRN

## 2023-08-17 MED ORDER — FUROSEMIDE 10 MG/ML IJ SOLN
20.0000 mg | Freq: Once | INTRAMUSCULAR | Status: AC
  Administered 2023-08-17: 20 mg via INTRAVENOUS
  Filled 2023-08-17: qty 2

## 2023-08-17 MED ORDER — SODIUM CHLORIDE 0.9 % IV BOLUS
1000.0000 mL | Freq: Once | INTRAVENOUS | Status: AC
  Administered 2023-08-17: 1000 mL via INTRAVENOUS

## 2023-08-17 MED ORDER — FAMOTIDINE 20 MG PO TABS: 20.0000 mg | ORAL_TABLET | Freq: Two times a day (BID) | ORAL | Status: DC

## 2023-08-17 MED ORDER — CHLORHEXIDINE GLUCONATE CLOTH 2 % EX PADS
6.0000 | MEDICATED_PAD | Freq: Every day | CUTANEOUS | Status: DC
  Administered 2023-08-17 – 2023-08-19 (×2): 6 via TOPICAL

## 2023-08-17 MED ORDER — FOLIC ACID 1 MG PO TABS
1.0000 mg | ORAL_TABLET | Freq: Every day | ORAL | Status: DC
  Administered 2023-08-18 – 2023-08-23 (×5): 1 mg via ORAL
  Filled 2023-08-17 (×5): qty 1

## 2023-08-17 MED ORDER — ALBUTEROL SULFATE (2.5 MG/3ML) 0.083% IN NEBU: 2.5000 mg | INHALATION_SOLUTION | RESPIRATORY_TRACT | Status: DC | PRN

## 2023-08-17 MED ORDER — ORAL CARE MOUTH RINSE
15.0000 mL | OROMUCOSAL | Status: DC
  Administered 2023-08-17: 15 mL via OROMUCOSAL

## 2023-08-17 MED ORDER — PANTOPRAZOLE SODIUM 40 MG IV SOLR
40.0000 mg | INTRAVENOUS | Status: AC
  Administered 2023-08-17 (×2): 40 mg via INTRAVENOUS
  Filled 2023-08-17: qty 10

## 2023-08-17 MED ORDER — HYDROCORTISONE SOD SUC (PF) 100 MG IJ SOLR
100.0000 mg | Freq: Three times a day (TID) | INTRAMUSCULAR | Status: DC
  Administered 2023-08-17 – 2023-08-19 (×6): 100 mg via INTRAVENOUS
  Filled 2023-08-17 (×10): qty 2

## 2023-08-17 NOTE — ED Triage Notes (Signed)
Pt arrived via [facility] from home c/o dizziness, lightheadedness x 1 week that has progressively gotten worse, Upon EMS arrival on scenept was hypotensive , BP was 58/43, EMS gave 1 L of NaCl 0.9% and BP came up to 80/53. Pt does take a couple of BP medications and has taken some of those medications today. Cbg 204. On blood thinners, Hx of MI, CVA, and a fib

## 2023-08-17 NOTE — Consult Note (Signed)
Patient Demographics  Connor Bryant, is a 77 y.o. male   MRN: [id number]   DOB - 05/19/1946  Admit Date - 08/17/2023    Outpatient Primary MD for the patient is Mechele Claude, MD  Consult requested in the Hospital by [facility], Lesia Sago, MD, On 08/17/2023    Reason for consult : GI bleed, to evaluate for admission.   With History of -  Past Medical History:  Diagnosis Date   Acute CVA (cerebrovascular accident) (HCC) 05/01/2020   Acute respiratory failure with hypoxia (HCC) 05/04/2020   Arthritis    DM type 2 (diabetes mellitus, type 2) (HCC)    no meds   Endotracheally intubated    Gallstone    Heart murmur    was told one time he had a murmur, not since 30 years ago   HTN (hypertension)    Hyperlipidemia    Personal history of colonic polyp-adenoma 11/24/2013   Renal insufficiency    mild   Thoracic spine fracture [facility])       Past Surgical History:  Procedure Laterality Date   ABDOMINAL AORTAGRAM N/A 02/23/2014   Procedure: ABDOMINAL Ronny Flurry;  Surgeon: Fransisco Hertz, MD;  Location: [facility] CATH LAB;  Service: Cardiovascular;  Laterality: N/A;   CATARACT EXTRACTION, BILATERAL     COLONOSCOPY     DEBRIDEMENT TENNIS ELBOW Right    ESOPHAGOGASTRODUODENOSCOPY     MESENTERIC ARTERY BYPASS N/A 03/10/2014   Procedure: AORTO TO SUPERIOR MESENTERIC ARTERY BYPASS;  Surgeon: Pryor Ochoa, MD;  Location: [facility] OR;  Service: Vascular;  Laterality: N/A;   TONSILLECTOMY AND ADENOIDECTOMY     vascualr surgery     illiac stents    in for   Chief Complaint  Patient presents with   Hypotension     HPI  Connor Bryant  is a 77 y.o. male, with past medical history of PVD with iliac stents, rheumatoid arthritis, on prednisone for few months, recently started on methotrexate, diabetes mellitus, hypertension, paroxysmal A-fib on Eliquis, hypertension, history of systolic CHF in the past EF 35% which has  resolved with medical management, PE, CAD, CVA. -Patient had syncope, he does report weakness, lightheadedness, and he reports passing out today when he stood up, EMS presented his blood pressure was 5/35, he received fluid bolus en route, upon presentation to ED his blood pressure was in the 70s, patient denies any chest pain, reports generalized weakness, fatigue, patient is on Eliquis for A-fib and remote history of PE in the past -In ED patient was noted to be hypotensive, required multiple boluses, hemoglobin came back low at 7.5, from 10.5 few months ago, he was noted by ED physician to have significant melena, Hemoccult positive on rectal exam, troponins were elevated at 46, he denies any chest pain, repeat troponin trending down at 461, creatinine is up at 2.39 from level of 1.5, patient denies any history of NSAID use, no aspirin, no Plavix recently, reports most recent Eliquis he received this morning, [facility] hospital[facility] consulted to evaluate  if appropriate to admit to [facility], given he is still hypotensive despite appropriate fluid and blood resuscitation, ED has requested admission to [facility], ICU.    Review of Systems      A full 10 point Review of Systems was done, except as stated above, all other Review of Systems were negative.   Social History Social History   Tobacco Use   Smoking status: Former    Current packs/day: 0.00    Types: Cigarettes    Quit date: 07/08/2009    Years since quitting: 14.1   Smokeless tobacco: Never  Substance Use Topics   Alcohol use: Yes    Alcohol/week: 12.0 standard drinks of alcohol    Types: 12 Cans of beer per week    Comment: 8 beers a week     Family History Family History  Problem Relation Age of Onset   Diabetes Mother    Heart disease Mother        before age 75   Hyperlipidemia Mother    Hypertension Mother    AAA (abdominal aortic aneurysm) Mother    Stroke Father    Hypertension Father    Coronary artery  disease Father    Hyperlipidemia Father    Diabetes Father    Bladder Cancer Father    Cancer Father    Heart disease Father    Heart attack Father    Colon cancer Neg Hx    Throat cancer Neg Hx    Kidney disease Neg Hx    Liver disease Neg Hx      Prior to Admission medications   Medication Sig Start Date End Date Taking? Authorizing Provider  allopurinol (ZYLOPRIM) 100 MG tablet TAKE 1 TABLET 2 TO 3 TIMES A DAY FOR GOUT Patient taking differently: Take 100 mg by mouth daily. 07/17/23  Yes Stacks, Broadus John, MD  amLODipine (NORVASC) 5 MG tablet Take 1 tablet (5 mg total) by mouth daily. For blood pressure 11/20/22  Yes Stacks, Broadus John, MD  apixaban (ELIQUIS) 5 MG TABS tablet Take 1 tablet (5 mg total) by mouth 2 (two) times daily. 11/20/22  Yes Mechele Claude, MD  atorvastatin (LIPITOR) 80 MG tablet Take 1 tablet (80 mg total) by mouth daily. 11/20/22  Yes Stacks, Broadus John, MD  calcium carbonate (OSCAL) 1500 (600 Ca) MG TABS tablet Take 600 mg of elemental calcium by mouth daily with breakfast.   Yes [provider]  carvedilol (COREG) 25 MG tablet TAKE ONE TABLET TWICE DAILY 07/23/23  Yes Branch, Dorothe Pea, MD  chlorthalidone (HYGROTON) 25 MG tablet Take 25 mg by mouth every morning. 12/12/21  Yes [provider]  ferrous sulfate 325 (65 FE) MG EC tablet Take 325 mg by mouth daily with breakfast.   Yes [provider]  folic acid (FOLVITE) 1 MG tablet Take 1 tablet (1 mg total) by mouth daily. 08/01/23  Yes Stacks, Broadus John, MD  glimepiride (AMARYL) 2 MG tablet TAKE ONE TABLET ONCE DAILY EVERY MORNING Patient taking differently: Take 2 mg by mouth daily with breakfast. 11/20/22  Yes Mechele Claude, MD  hydrALAZINE (APRESOLINE) 100 MG tablet Take 1 tablet (100 mg total) by mouth 3 (three) times daily. Patient taking differently: Take 100 mg by mouth 2 (two) times daily. 11/20/22  Yes Mechele Claude, MD  isosorbide mononitrate (IMDUR) 30 MG 24 hr tablet TAKE ONE TABLET BY  MOUTH DAILY 11/28/22  Yes Branch, Dorothe Pea, MD  levocetirizine (XYZAL) 5 MG tablet TAKE ONE TABLET EVERY EVENING 12/18/22  Yes Mechele Claude, MD  lisinopril (ZESTRIL) 20 MG tablet Take 1 tablet (20 mg total) by mouth daily. 11/20/22 11/15/23 Yes Stacks, Broadus John, MD  methotrexate (RHEUMATREX) 2.5 MG tablet Take 4 tablets (10 mg total) by mouth once a week. Caution:Chemotherapy. Protect from light. Patient taking differently: Take 5 mg by mouth once a week. Caution:Chemotherapy. Protect from light. 08/01/23  Yes Stacks, Broadus John, MD  polyethylene glycol (MIRALAX / GLYCOLAX) packet Take 17 g by mouth daily. 10/02/18  Yes Leatha Gilding, MD  predniSONE (DELTASONE) 10 MG tablet Take 1 tablet (10 mg total) by mouth daily with breakfast. 06/25/23  Yes Mechele Claude, MD  pyridOXINE (B-6) 50 MG tablet Take 1 tablet (50 mg total) by mouth daily. 08/01/23  Yes Mechele Claude, MD  Blood Glucose Monitoring Suppl DEVI 1 each by Does not apply route in the morning, at noon, and at bedtime. May substitute to any manufacturer covered by patient's insurance. 06/25/23   Mechele Claude, MD    Anti-infectives (From admission, onward)    None       Scheduled Meds:  hydrocortisone sod succinate (SOLU-CORTEF) inj  100 mg Intravenous Q8H   pantoprazole (PROTONIX) IV  40 mg Intravenous Q6H   Followed by   Melene Muller ON 08/20/2023] pantoprazole (PROTONIX) IV  40 mg Intravenous Q12H   Continuous Infusions:  prothrombin complex conc human (KCENTRA) IVPB 4,291 Units     PRN Meds:.  No Known Allergies  Physical Exam  Vitals  Blood pressure 96/74, pulse 97, temperature 98.2 F (36.8 C), temperature source Oral, resp. rate (!) 23, height 5\' 8"  (1.727 m), weight 89.4 kg, SpO2 94%.   1. General Developed male, laying in bed, no apparent distress  2. Normal affect and insight, Not Suicidal or Homicidal, Awake Alert, Oriented X 3.  3. No F.N deficits, ALL C.Nerves Intact, Strength 5/5 all 4 extremities, Sensation intact  all 4 extremities, Plantars down going.  4. Ears and Eyes appear Normal, Conjunctivae clear, PERRLA. Moist Oral Mucosa.  5. Supple Neck, No JVD, No cervical lymphadenopathy appriciated, No Carotid Bruits.  6. Symmetrical Chest wall movement, Good air movement bilaterally, CTAB.  7.  Irregular, no Gallops, Rubs or Murmurs, No Parasternal Heave.  8. Positive Bowel Sounds, Abdomen Soft, No tenderness, No organomegaly appriciated,No rebound -guarding or rigidity.  9.  No Cyanosis, Normal Skin Turgor, No Skin Rash or Bruise.  10. Good muscle tone,  joints appear normal , no effusions, Normal ROM.    Data Review  CBC Recent Labs  Lab 08/17/23 1437  WBC 6.0  HGB 7.5*  HCT 23.3*  PLT 141*  MCV 98.3  MCH 31.6  MCHC 32.2  RDW 17.3*   ------------------------------------------------------------------------------------------------------------------  Chemistries  Recent Labs  Lab 08/17/23 1437  NA 136  K 4.0  CL 103  CO2 23  GLUCOSE 137*  BUN 70*  CREATININE 2.39*  CALCIUM 9.6  AST 30  ALT 28  ALKPHOS 51  BILITOT 1.2*   ------------------------------------------------------------------------------------------------------------------ estimated creatinine clearance is 27.7 mL/min (A) (by C-G formula based on SCr of 2.39 mg/dL (H)). ------------------------------------------------------------------------------------------------------------------ No results for input(s): "TSH", "T4TOTAL", "T3FREE", "THYROIDAB" in the last 72 hours.  Invalid input(s): "FREET3"   Coagulation profile Recent Labs  Lab 08/17/23 1437  INR 2.1*   ------------------------------------------------------------------------------------------------------------------- No results for input(s): "DDIMER" in the last 72 hours. -------------------------------------------------------------------------------------------------------------------  Cardiac Enzymes No results for input(s): "CKMB",  "TROPONINI", "MYOGLOBIN" in the last 168 hours.  Invalid input(s): "CK" ------------------------------------------------------------------------------------------------------------------ Invalid input(s): "POCBNP"   ---------------------------------------------------------------------------------------------------------------  Urinalysis  Component Value Date/Time   COLORURINE RED (A) 05/04/2020 1555   APPEARANCEUR TURBID (A) 05/04/2020 1555   LABSPEC  05/04/2020 1555    TEST NOT REPORTED DUE TO COLOR INTERFERENCE OF URINE PIGMENT   PHURINE  05/04/2020 1555    TEST NOT REPORTED DUE TO COLOR INTERFERENCE OF URINE PIGMENT   GLUCOSEU (A) 05/04/2020 1555    TEST NOT REPORTED DUE TO COLOR INTERFERENCE OF URINE PIGMENT   HGBUR (A) 05/04/2020 1555    TEST NOT REPORTED DUE TO COLOR INTERFERENCE OF URINE PIGMENT   BILIRUBINUR (A) 05/04/2020 1555    TEST NOT REPORTED DUE TO COLOR INTERFERENCE OF URINE PIGMENT   KETONESUR (A) 05/04/2020 1555    TEST NOT REPORTED DUE TO COLOR INTERFERENCE OF URINE PIGMENT   PROTEINUR (A) 05/04/2020 1555    TEST NOT REPORTED DUE TO COLOR INTERFERENCE OF URINE PIGMENT   UROBILINOGEN 1.0 03/04/2014 1358   NITRITE (A) 05/04/2020 1555    TEST NOT REPORTED DUE TO COLOR INTERFERENCE OF URINE PIGMENT   LEUKOCYTESUR (A) 05/04/2020 1555    TEST NOT REPORTED DUE TO COLOR INTERFERENCE OF URINE PIGMENT     Imaging results:   DG Chest Portable 1 View  Result Date: 08/17/2023 CLINICAL DATA:  GI bleed, hypotension EXAM: PORTABLE CHEST 1 VIEW COMPARISON:  05/27/2020 FINDINGS: Cardiomegaly. Diffuse bilateral interstitial pulmonary opacity. Chronic, callused fracture of the left clavicle. IMPRESSION: Cardiomegaly with diffuse bilateral interstitial pulmonary opacity, consistent with edema or atypical/viral infection. No focal airspace opacity. Electronically Signed   By: Jearld Lesch M.D.   On: 08/17/2023 15:31    EKG:  Vent. rate 112 BPM PR interval * ms QRS  duration 93 ms QT/QTcB 321/439 ms P-R-T axes * 16 46 Atrial fibrillation Low voltage, precordial leads Anteroseptal infarct, old Borderline ST depression, diffuse leads    Assessment & Plan  Principal Problem:   GI bleed    Hypotension> hemorrhagic shock Acute GI bleed Acute blood loss anemia Syncope -Patient presents with profound hypotensive,Findings 55/35 by EMS, improved with fluid bolus EMS, blood pressure in the 80s, remains MAP less than 65 despite IV fluid boluses and blood transfusion, so patient will be transferred to ICU at [facility]. -20 PRBC ordered stat, continue with IV fluid boluses -With significant melena, Hemoccult positive stool, he denies any NSAID use, reports he is only using Eliquis, no aspirin, no Plavix, continue with Protonix drip. -Patient received Kcentra to reverse his Eliquis. -With known history of mesenteric artery bypass in 2015, his source of bleed is laded to GI bleed given significant melena, and absence of abdominal pain, but low threshold to image if  his GI workup is negative.  Steroid dependence -Patient is steroid-dependent 10 mg p.o. daily for last 2 months, so we will start on stress dose steroids  Coagulopathy in the setting of Eliquis use -Received Kcentra  AKI on CKD stage IIIa -Due to hypotension, avoid nephrotoxic medications, hold blood pressure medications continue with IV fluids  Elevated troponins -Due to demand ischemia from hypotension, shock and GI bleed, he denies any chest pain, troponins are trending down  Rheumatoid arthritis -Started on methotrexate recently, will hold for now, he was on steroids for last few month (10 mg oral daily)  Assessment atrial fibrillation -Currently in A-fib, heart rate controlled, Juliane 80s in the today monitor, Eliquis on hold due to above, continue to hold Coreg  Patient with history of PE in 2021 -Will hold Eliquis on hold due to above  [facility] M.D  on 08/17/2023 at  5:23 PM   [facility]   Office  [phone]

## 2023-08-17 NOTE — ED Notes (Signed)
Pt receiving his first unit of emergency blood, lab called and stated that his second unit of blood is ready whenever his first unit is complete. Charge RN , Lequita Halt, and primary RN for that pt, Connor Bryant, made aware

## 2023-08-17 NOTE — H&P (Signed)
NAME:  Connor Bryant, MRN:  [id number], DOB:  1946-02-19, LOS: 0 ADMISSION DATE:  08/17/2023, CONSULTATION DATE:  11/23 REFERRING MD:  Dr. Posey Rea EDP, CHIEF COMPLAINT:  GI bleed   History of Present Illness:  77 year old male with PMH as below, which is significant for RA recently started on methotrexate, prednisone, Atrial fibrillation and Eliquis, DM, HLD, and stroke.  He presented to [facility] emergency department on 11/23 with complaints of weakness and lightheadedness starting 2 weeks prior to presentation.  Symptoms really intensified about 1 week prior to arrival.  On the day of presentation he experienced near syncope when rising to a standing position from sitting which prompted him to present to the emergency department via EMS.  Upon EMS arrival systolic blood pressure was noted to be 58.  He was infused with 1 L of normal saline and systolic blood pressure improved to the 80s.  He has been taking his multiple antihypertensives as prescribed.  Also described dark stools intermittently over period of weeks.  Laboratory evaluation significant for hemoglobin 7.5 (which was 10.5 in August), INR 2.1, troponin elevated at 546, BUN 70, and creatinine 2.39.  Despite 2 units of packed red blood cells and IV fluid resuscitation borderline hypotension remained and the patient was transferred to [facility] for ICU care with PCCM admitting.  Pertinent  Medical History   has a past medical history of Acute CVA (cerebrovascular accident) (HCC) (05/01/2020), Acute respiratory failure with hypoxia (HCC) (05/04/2020), Arthritis, DM type 2 (diabetes mellitus, type 2) (HCC), Endotracheally intubated, Gallstone, Heart murmur, HTN (hypertension), Hyperlipidemia, Personal history of colonic polyp-adenoma (11/24/2013), Renal insufficiency, and Thoracic spine fracture (HCC).   Significant Hospital Events: Including procedures, antibiotic start and stop dates in addition to other pertinent events   11/23 admitted for  hypotension secondary to acute blood loss anemia and GI bleed  Interim History / Subjective:    Objective   Blood pressure 96/70, pulse 98, temperature 97.6 F (36.4 C), temperature source Oral, resp. rate (!) 21, height 5\' 8"  (1.727 m), weight 89.4 kg, SpO2 (!) 2%.        Intake/Output Summary (Last 24 hours) at 08/17/2023 1903 Last data filed at 08/17/2023 1803 Gross per 24 hour  Intake 2020.5 ml  Output --  Net 2020.5 ml   Filed Weights   08/17/23 1451  Weight: 89.4 kg    Examination: General: Elderly appearing male in mild to moderate respiratory distress HENT: Normocephalic, atraumatic, PERRL Lungs: Diffuse wheeze and bibasilar crackles.  Only able to speak a few words at a time due to dyspnea. Cardiovascular: Regular rate and rhythm, no peripheral edema Abdomen: Soft, nontender, nondistended Extremities: No acute deformity or range of motion imitation Neuro: Alert, oriented, non-focal  Resolved Hospital Problem list     Assessment & Plan:   Hypotension: Secondary to GI bleed and acute blood loss anemia.  Blood pressure appears to be improved now status post 2 units of packed red blood cells and IV fluids.  Fecal occult blood positive. - ICU hemodynamic monitoring -Status post 2 units of PRBC -Trend hemoglobin and hematocrit -Holding home amlodipine, carvedilol, chlorthalidone, hydralazine, isosorbide, lisinopril,   Gastrointestinal bleed: He is a 2-3 beer a day drinker which stopped about 1 month ago.  Does not use NSAIDs.  Takes Eliquis, prednisone, and methotrexate all increasing risk.  Acute blood loss anemia -Holding Eliquis -Trend H&H -Protonix twice daily -Consider GI consult in the morning  Respiratory distress: No real hypoxia, however, is breathing  30-40 times a minute.  Crackles and wheeze on exam.  Concerned he has some pulmonary edema from transfusion. No history of COPD/Ashtma -Chest x-ray -ABG -It looks like edema on x-ray will give some  Lasix -BiPAP for work of breathing - PRN levalbuterol   AKI on CKD 3a  - hemodynamic support - Repeat BMP  Diabetes mellitus - holding home glimepiride - CBG monitoring and SSI  RA - seems like intermittent compliance with both methotrexate and prednisone. Hasn't been taking per med rec. Will hold.   Gout - continue allopurinol   Best Practice (right click and "Reselect all SmartList Selections" daily)   Diet/type: NPO DVT prophylaxis: SCD   Pressure ulcer(s): not present on admission  GI prophylaxis: PPI Lines: N/A Foley:  N/A Code Status:  full code - per discussion 11/23 Last date of multidisciplinary goals of care discussion [ ]   Labs   CBC: Recent Labs  Lab 08/17/23 1437  WBC 6.0  HGB 7.5*  HCT 23.3*  MCV 98.3  PLT 141*    Basic Metabolic Panel: Recent Labs  Lab 08/17/23 1437  NA 136  K 4.0  CL 103  CO2 23  GLUCOSE 137*  BUN 70*  CREATININE 2.39*  CALCIUM 9.6   GFR: Estimated Creatinine Clearance: 27.7 mL/min (A) (by C-G formula based on SCr of 2.39 mg/dL (H)). Recent Labs  Lab 08/17/23 1437 08/17/23 1621  WBC 6.0  --   LATICACIDVEN  --  1.7    Liver Function Tests: Recent Labs  Lab 08/17/23 1437  AST 30  ALT 28  ALKPHOS 51  BILITOT 1.2*  PROT 6.2*  ALBUMIN 3.2*   No results for input(s): "LIPASE", "AMYLASE" in the last 168 hours. No results for input(s): "AMMONIA" in the last 168 hours.  ABG    Component Value Date/Time   PHART 7.326 (L) 05/13/2020 0946   PCO2ART 44.4 05/13/2020 0946   PO2ART 407 (H) 05/13/2020 0946   HCO3 22.1 05/13/2020 0946   TCO2 26 03/11/2014 0432   ACIDBASEDEF 2.6 (H) 05/13/2020 0946   O2SAT 99.5 05/13/2020 0946     Coagulation Profile: Recent Labs  Lab 08/17/23 1437  INR 2.1*    Cardiac Enzymes: No results for input(s): "CKTOTAL", "CKMB", "CKMBINDEX", "TROPONINI" in the last 168 hours.  HbA1C: HB A1C (BAYER DCA - WAIVED)  Date/Time Value Ref Range Status  05/23/2023 02:28 PM 6.3 (H)  4.8 - 5.6 % Final    Comment:             Prediabetes: 5.7 - 6.4          Diabetes: >6.4          Glycemic control for adults with diabetes: <7.0   02/19/2023 03:25 PM 5.3 4.8 - 5.6 % Final    Comment:             Prediabetes: 5.7 - 6.4          Diabetes: >6.4          Glycemic control for adults with diabetes: <7.0     CBG: Recent Labs  Lab 08/17/23 1857  GLUCAP 195*    Review of Systems:   Bolds are positive  Constitutional: weight loss, gain, night sweats, Fevers, chills, fatigue .  HEENT: headaches, Sore throat, sneezing, nasal congestion, post nasal drip, Difficulty swallowing, Tooth/dental problems, visual complaints visual changes, ear ache CV:  chest pain, radiates:,Orthopnea, PND, swelling in lower extremities, dizziness, palpitations, syncope.  GI  heartburn, indigestion, abdominal pain, nausea,  vomiting, diarrhea, change in bowel habits, loss of appetite, bloody stools. Dark stools Resp: cough, productive: , hemoptysis, dyspnea, chest pain, pleuritic.  Skin: rash or itching or icterus GU: dysuria, change in color of urine, urgency or frequency. flank pain, hematuria  MS: joint pain or swelling. decreased range of motion  Psych: change in mood or affect. depression or anxiety Neuro: difficulty with speech, weakness, numbness, ataxia    Past Medical History:  He,  has a past medical history of Acute CVA (cerebrovascular accident) (HCC) (05/01/2020), Acute respiratory failure with hypoxia (HCC) (05/04/2020), Arthritis, DM type 2 (diabetes mellitus, type 2) (HCC), Endotracheally intubated, Gallstone, Heart murmur, HTN (hypertension), Hyperlipidemia, Personal history of colonic polyp-adenoma (11/24/2013), Renal insufficiency, and Thoracic spine fracture (HCC).   Surgical History:   Past Surgical History:  Procedure Laterality Date   ABDOMINAL AORTAGRAM N/A 02/23/2014   Procedure: ABDOMINAL AORTAGRAM;  Surgeon: Fransisco Hertz, MD;  Location: [facility] CATH LAB;  Service:  Cardiovascular;  Laterality: N/A;   CATARACT EXTRACTION, BILATERAL     COLONOSCOPY     DEBRIDEMENT TENNIS ELBOW Right    ESOPHAGOGASTRODUODENOSCOPY     MESENTERIC ARTERY BYPASS N/A 03/10/2014   Procedure: AORTO TO SUPERIOR MESENTERIC ARTERY BYPASS;  Surgeon: Pryor Ochoa, MD;  Location: [facility] OR;  Service: Vascular;  Laterality: N/A;   TONSILLECTOMY AND ADENOIDECTOMY     vascualr surgery     illiac stents     Social History:   reports that he quit smoking about 14 years ago. His smoking use included cigarettes. He has never used smokeless tobacco. He reports current alcohol use of about 12.0 standard drinks of alcohol per week. He reports that he does not currently use drugs.   Family History:  His family history includes AAA (abdominal aortic aneurysm) in his mother; Bladder Cancer in his father; Cancer in his father; Coronary artery disease in his father; Diabetes in his father and mother; Heart attack in his father; Heart disease in his father and mother; Hyperlipidemia in his father and mother; Hypertension in his father and mother; Stroke in his father. There is no history of Colon cancer, Throat cancer, Kidney disease, or Liver disease.   Allergies No Known Allergies   Home Medications  Prior to Admission medications   Medication Sig Start Date End Date Taking? Authorizing Provider  allopurinol (ZYLOPRIM) 100 MG tablet TAKE 1 TABLET 2 TO 3 TIMES A DAY FOR GOUT Patient taking differently: Take 100 mg by mouth daily. 07/17/23  Yes Stacks, Broadus John, MD  amLODipine (NORVASC) 5 MG tablet Take 1 tablet (5 mg total) by mouth daily. For blood pressure 11/20/22  Yes Stacks, Broadus John, MD  apixaban (ELIQUIS) 5 MG TABS tablet Take 1 tablet (5 mg total) by mouth 2 (two) times daily. 11/20/22  Yes Mechele Claude, MD  atorvastatin (LIPITOR) 80 MG tablet Take 1 tablet (80 mg total) by mouth daily. 11/20/22  Yes Stacks, Broadus John, MD  calcium carbonate (OSCAL) 1500 (600 Ca) MG TABS tablet Take 600 mg of  elemental calcium by mouth daily with breakfast.   Yes [provider]  carvedilol (COREG) 25 MG tablet TAKE ONE TABLET TWICE DAILY 07/23/23  Yes Branch, Dorothe Pea, MD  chlorthalidone (HYGROTON) 25 MG tablet Take 25 mg by mouth every morning. 12/12/21  Yes [provider]  ferrous sulfate 325 (65 FE) MG EC tablet Take 325 mg by mouth daily with breakfast.   Yes [provider]  folic acid (FOLVITE) 1 MG tablet Take 1 tablet (1 mg  total) by mouth daily. 08/01/23  Yes Stacks, Broadus John, MD  glimepiride (AMARYL) 2 MG tablet TAKE ONE TABLET ONCE DAILY EVERY MORNING Patient taking differently: Take 2 mg by mouth daily with breakfast. 11/20/22  Yes Mechele Claude, MD  hydrALAZINE (APRESOLINE) 100 MG tablet Take 1 tablet (100 mg total) by mouth 3 (three) times daily. Patient taking differently: Take 100 mg by mouth 2 (two) times daily. 11/20/22  Yes Mechele Claude, MD  isosorbide mononitrate (IMDUR) 30 MG 24 hr tablet TAKE ONE TABLET BY MOUTH DAILY 11/28/22  Yes Branch, Dorothe Pea, MD  levocetirizine (XYZAL) 5 MG tablet TAKE ONE TABLET EVERY EVENING 12/18/22  Yes Mechele Claude, MD  lisinopril (ZESTRIL) 20 MG tablet Take 1 tablet (20 mg total) by mouth daily. 11/20/22 11/15/23 Yes Stacks, Broadus John, MD  methotrexate (RHEUMATREX) 2.5 MG tablet Take 4 tablets (10 mg total) by mouth once a week. Caution:Chemotherapy. Protect from light. Patient taking differently: Take 5 mg by mouth once a week. Caution:Chemotherapy. Protect from light. 08/01/23  Yes Stacks, Broadus John, MD  polyethylene glycol (MIRALAX / GLYCOLAX) packet Take 17 g by mouth daily. 10/02/18  Yes Leatha Gilding, MD  predniSONE (DELTASONE) 10 MG tablet Take 1 tablet (10 mg total) by mouth daily with breakfast. 06/25/23  Yes Mechele Claude, MD  pyridOXINE (B-6) 50 MG tablet Take 1 tablet (50 mg total) by mouth daily. 08/01/23  Yes Mechele Claude, MD  Blood Glucose Monitoring Suppl DEVI 1 each by Does not apply route in the morning, at noon,  and at bedtime. May substitute to any manufacturer covered by patient's insurance. 06/25/23   Mechele Claude, MD     Critical care time: 36 minutes     Joneen Roach, AGACNP-BC [facility]  See Amion for personal pager PCCM on call pager [phone] until 7pm. Please call Elink 7p-7a. [phone]  08/17/2023 7:49 PM

## 2023-08-17 NOTE — Progress Notes (Signed)
Pt placed on BIPAP per MD order.   08/17/23 1924  Vent Select  Invasive or Noninvasive Noninvasive  Adult Vent Y  Vent start date 08/17/23  Vent start time 1924  Adult Ventilator Settings  Vent Type Servo i  Vent Mode BIPAP;PCV  FiO2 (%) 50 %  I Time 0.9 Sec(s)  IPAP 10 cmH20  EPAP 5 cmH20  Pressure Control 5 cmH20  PEEP 5 cmH20  Adult Ventilator Measurements  Peak Airway Pressure 12 L/min  Mean Airway Pressure 7 cmH20  Resp Rate Spontaneous 7 br/min  Resp Rate Total 23 br/min  Exhaled Vt 642 mL  Measured Ve 15.5 L  I:E Ratio Measured 1:1.7  Auto PEEP 0 cmH20  Total PEEP 5 cmH20  SpO2 97 %  Adult Ventilator Alarms  Alarms On Y

## 2023-08-17 NOTE — ED Provider Notes (Signed)
  Physical Exam  BP (!) 93/52   Pulse 95   Temp 97.7 F (36.5 C) (Oral)   Resp 18   Ht 5\' 8"  (1.727 m)   Wt 89.4 kg   SpO2 92%   BMI 29.95 kg/m   Physical Exam Constitutional:      General: He is not in acute distress.    Appearance: Normal appearance.  HENT:     Head: Normocephalic and atraumatic.     Nose: No congestion or rhinorrhea.  Eyes:     General:        Right eye: No discharge.        Left eye: No discharge.     Extraocular Movements: Extraocular movements intact.     Pupils: Pupils are equal, round, and reactive to light.  Cardiovascular:     Rate and Rhythm: Tachycardia present. Rhythm irregular.     Heart sounds: No murmur heard. Pulmonary:     Effort: No respiratory distress.     Breath sounds: No wheezing or rales.  Abdominal:     General: There is no distension.     Tenderness: There is no abdominal tenderness.  Musculoskeletal:        General: Normal range of motion.     Cervical back: Normal range of motion.  Skin:    General: Skin is warm and dry.  Neurological:     General: No focal deficit present.     Mental Status: He is alert.     Procedures  .Critical Care  Performed by: Glendora Score, MD Authorized by: Glendora Score, MD   Critical care provider statement:    Critical care time (minutes):  30   Critical care was necessary to treat or prevent imminent or life-threatening deterioration of the following conditions:  Shock   Critical care was time spent personally by me on the following activities:  Development of treatment plan with patient or surrogate, discussions with consultants, evaluation of patient's response to treatment, examination of patient, ordering and review of laboratory studies, ordering and review of radiographic studies, ordering and performing treatments and interventions, pulse oximetry, re-evaluation of patient's condition and review of old charts   ED Course / MDM    Medical Decision Making Amount and/or  Complexity of Data Reviewed Labs: ordered. Radiology: ordered.  Risk Prescription drug management. Decision regarding hospitalization.   Patient received in handoff.  GI bleed with hypotension.  Pending reevaluation after blood transfusion to determine level of care.  Patient received 1 unit packed red blood cell without significant improvement of his blood pressures.  Evaluated by the hospitalist at bedside and we are both in agreements that patient would be better served in an ICU setting at a higher level of care.  Spoke with intensive care physician Dr. Judeth Horn who accepts for ICU admission.  Eliquis reversed.  Patient admitted       Glendora Score, MD 08/17/23 [phone]

## 2023-08-17 NOTE — Progress Notes (Signed)
eLink Physician-Brief Progress Note Patient Name: LATRAVION SCHMALZRIED DOB: 11/20/1945 MRN: [id number]   Date of Service  08/17/2023  HPI/Events of Note  77 year old male with PMH of RA recently started on methotrexate, prednisone, Atrial fibrillation and Eliquis, DM, HLD, and stroke who presented to [facility] emergency department on 11/23 with complaints of weakness and lightheadedness.  Found to be hypotensive with decreased Hgb and recent history of dark stools.  Received volume resuscitation that included prbc's.  Presented to [facility] short of breath with concern for development of edema.  Lasix given and bipap started.  Eliquis held.  PPI started.  Follow hgb.  GI consult.   eICU Interventions  Chart reviewed     Intervention Category Evaluation Type: New Patient Evaluation  Henry Russel, P 08/17/2023, 8:41 PM

## 2023-08-17 NOTE — Progress Notes (Signed)
   PCCM transfer request    Sending physician: Dr. Audrie Lia  Sending facility: [facility]  Reason for transfer: Melena, GI bleed with anemia, hypotension  Brief case summary:  77 year old on Eliquis presented with malaise weakness melena.  Hemoglobin 3 points down from most recent value which was already anemic.  Hypotensive.  Mild improvement with 2 units of blood are on second unit of blood.  Systolic still 80s and 90s.  Lactic acid within normal notes.  Mild troponin leak likely demand mismatch in the setting of severe anemia.  On IV PPI.  Got Kcentra.  Recommendations made prior to transfer:  Transfer accepted: yes yes, okay for [facility] or [facility] long hospital    Connor Bryant [facility] 08/17/23 5:01 PM [facility]  For contact information, see Amion. If no response to pager, please call PCCM consult pager. After hours, 7PM- 7AM, please call Elink.

## 2023-08-17 NOTE — Consult Note (Signed)
PHARMACY CONSULT NOTE - Anticoagulation Reversal  Assessment Connor Bryant is a 77 y.o. male presenting with GIB. Patient was on apixaban with last dose this morning per provider. Pharmacy has been consulted to monitor post reversal.  Recent Labs    08/17/23 1437  HGB 7.5*  HCT 23.3*  PLT 141*  LABPROT 23.6*  INR 2.1*  CREATININE 2.39*  TROPONINIHS 546*    Plan:  Give Kcentra 4291 units x1 No additional lab monitoring required for reversal of DOAC with [facility] will sign off and continue to monitor peripherally  Thank you for allowing pharmacy to be a part of this patient's care.  Celene Squibb, PharmD Clinical Pharmacist 08/17/2023 4:37 PM

## 2023-08-17 NOTE — Progress Notes (Signed)
eLink Physician-Brief Progress Note Patient Name: Connor Bryant DOB: 1946/06/24 MRN: [id number]   Date of Service  08/17/2023  HPI/Events of Note  Troponin elevated.  Supply demand mismatch related to anemia in presence of CAD.  No obvious ischemic changes on EKG at 1424.    eICU Interventions  Echo ordered Diuresis ordered for acute chf Supportive care     Intervention Category Major Interventions: OtherHenry Russel, Demetrius Charity 08/17/2023, 9:27 PM

## 2023-08-17 NOTE — ED Provider Notes (Signed)
[place] EMERGENCY DEPARTMENT AT [facility] Provider Note   CSN: [id number] Arrival date & time: 08/17/23  1413     History  Chief Complaint  Patient presents with   Hypotension    Connor Bryant is a 77 y.o. male.  Pt is a 77 yo male with pmhx significant for htn, dm2, hld, ckd, arthritis, cva, RA, paroxysmal af (on Eliquis).  Pt was recently diagnosed with RA and put on methotrexate about a month ago.  He started to feel weak and lightheaded about a week ago.  Today, he was feeling he was going to pass out when he stood up.  EMS was called and BP was 58.  Pt was given 1L NS en route and bp up to 80s.  Pt did take his bp meds this am.       Home Medications Prior to Admission medications   Medication Sig Start Date End Date Taking? Authorizing Provider  allopurinol (ZYLOPRIM) 100 MG tablet TAKE 1 TABLET 2 TO 3 TIMES A DAY FOR GOUT 07/17/23   Mechele Claude, MD  amLODipine (NORVASC) 5 MG tablet Take 1 tablet (5 mg total) by mouth daily. For blood pressure 11/20/22   Mechele Claude, MD  apixaban (ELIQUIS) 5 MG TABS tablet Take 1 tablet (5 mg total) by mouth 2 (two) times daily. 11/20/22   Mechele Claude, MD  atorvastatin (LIPITOR) 80 MG tablet Take 1 tablet (80 mg total) by mouth daily. 11/20/22   Mechele Claude, MD  Blood Glucose Monitoring Suppl DEVI 1 each by Does not apply route in the morning, at noon, and at bedtime. May substitute to any manufacturer covered by patient's insurance. 06/25/23   Mechele Claude, MD  carvedilol (COREG) 25 MG tablet TAKE ONE TABLET TWICE DAILY 07/23/23   Antoine Poche, MD  chlorthalidone (HYGROTON) 25 MG tablet Take 25 mg by mouth every morning. 12/12/21   [provider]  fluticasone (FLONASE) 50 MCG/ACT nasal spray Place 2 sprays into both nostrils daily. 06/14/21   Sonny Masters, FNP  folic acid (FOLVITE) 1 MG tablet Take 1 tablet (1 mg total) by mouth daily. 08/01/23   Mechele Claude, MD  glimepiride (AMARYL) 2 MG tablet  TAKE ONE TABLET ONCE DAILY EVERY MORNING 11/20/22   Mechele Claude, MD  Glucose Blood (BLOOD GLUCOSE TEST STRIPS) STRP 1 each by In Vitro route in the morning, at noon, and at bedtime. May substitute to any manufacturer covered by patient's insurance. 06/25/23   Mechele Claude, MD  hydrALAZINE (APRESOLINE) 100 MG tablet Take 1 tablet (100 mg total) by mouth 3 (three) times daily. 11/20/22   Mechele Claude, MD  isosorbide mononitrate (IMDUR) 30 MG 24 hr tablet TAKE ONE TABLET BY MOUTH DAILY 11/28/22   Antoine Poche, MD  levocetirizine (XYZAL) 5 MG tablet TAKE ONE TABLET EVERY EVENING 12/18/22   Mechele Claude, MD  lisinopril (ZESTRIL) 20 MG tablet Take 1 tablet (20 mg total) by mouth daily. 11/20/22 11/15/23  Mechele Claude, MD  methotrexate (RHEUMATREX) 2.5 MG tablet Take 4 tablets (10 mg total) by mouth once a week. Caution:Chemotherapy. Protect from light. 08/01/23   Mechele Claude, MD  polyethylene glycol (MIRALAX / Ethelene Hal) packet Take 17 g by mouth daily. 10/02/18   Leatha Gilding, MD  predniSONE (DELTASONE) 10 MG tablet Take 1 tablet (10 mg total) by mouth daily with breakfast. 06/25/23   Mechele Claude, MD  pyridOXINE (B-6) 50 MG tablet Take 1 tablet (50 mg total) by mouth daily. 08/01/23  Mechele Claude, MD      Allergies    Patient has no known allergies.    Review of Systems   Review of Systems  Neurological:  Positive for weakness.  All other systems reviewed and are negative.   Physical Exam Updated Vital Signs BP (!) 76/50   Pulse (!) 104   Temp 97.6 F (36.4 C) (Oral)   Resp 16   Ht 5\' 8"  (1.727 m)   Wt 89.4 kg   SpO2 98%   BMI 29.95 kg/m  Physical Exam Vitals and nursing note reviewed.  Constitutional:      Appearance: Normal appearance. He is obese. He is ill-appearing.  HENT:     Head: Normocephalic and atraumatic.     Right Ear: External ear normal.     Left Ear: External ear normal.     Nose: Nose normal.     Mouth/Throat:     Mouth: Mucous membranes are  moist.     Pharynx: Oropharynx is clear.  Eyes:     Extraocular Movements: Extraocular movements intact.     Pupils: Pupils are equal, round, and reactive to light.  Cardiovascular:     Rate and Rhythm: Normal rate and regular rhythm.     Pulses: Normal pulses.     Heart sounds: Normal heart sounds.  Pulmonary:     Effort: Pulmonary effort is normal.     Breath sounds: Normal breath sounds.  Abdominal:     General: Abdomen is flat. Bowel sounds are normal.     Palpations: Abdomen is soft.  Musculoskeletal:        General: Normal range of motion.     Cervical back: Normal range of motion and neck supple.  Skin:    General: Skin is warm.     Capillary Refill: Capillary refill takes less than 2 seconds.  Neurological:     General: No focal deficit present.     Mental Status: He is alert and oriented to person, place, and time.  Psychiatric:        Mood and Affect: Mood normal.        Behavior: Behavior normal.     ED Results / Procedures / Treatments   Labs (all labs ordered are listed, but only abnormal results are displayed) Labs Reviewed  BASIC METABOLIC PANEL - Abnormal; Notable for the following components:      Result Value   Glucose, Bld 137 (*)    BUN 70 (*)    Creatinine, Ser 2.39 (*)    GFR, Estimated 27 (*)    All other components within normal limits  CBC - Abnormal; Notable for the following components:   RBC 2.37 (*)    Hemoglobin 7.5 (*)    HCT 23.3 (*)    RDW 17.3 (*)    Platelets 141 (*)    All other components within normal limits  PROTIME-INR - Abnormal; Notable for the following components:   Prothrombin Time 23.6 (*)    INR 2.1 (*)    All other components within normal limits  HEPATIC FUNCTION PANEL - Abnormal; Notable for the following components:   Total Protein 6.2 (*)    Albumin 3.2 (*)    Total Bilirubin 1.2 (*)    Bilirubin, Direct 0.4 (*)    All other components within normal limits  POC OCCULT BLOOD, ED - Abnormal; Notable for the  following components:   Fecal Occult Bld POSITIVE (*)    All other components within normal limits  TROPONIN I (HIGH SENSITIVITY) - Abnormal; Notable for the following components:   Troponin I (High Sensitivity) 546 (*)    All other components within normal limits  TYPE AND SCREEN  PREPARE RBC (CROSSMATCH)  TROPONIN I (HIGH SENSITIVITY)    EKG EKG Interpretation Date/Time:  Saturday August 17 2023 14:24:26 EST Ventricular Rate:  112 PR Interval:    QRS Duration:  93 QT Interval:  321 QTC Calculation: 439 R Axis:   16  Text Interpretation: Atrial fibrillation Low voltage, precordial leads Anteroseptal infarct, old Borderline ST depression, diffuse leads Confirmed by Jacalyn Lefevre [phone]) on 08/17/2023 3:25:18 PM  Radiology DG Chest Portable 1 View  Result Date: 08/17/2023 CLINICAL DATA:  GI bleed, hypotension EXAM: PORTABLE CHEST 1 VIEW COMPARISON:  05/27/2020 FINDINGS: Cardiomegaly. Diffuse bilateral interstitial pulmonary opacity. Chronic, callused fracture of the left clavicle. IMPRESSION: Cardiomegaly with diffuse bilateral interstitial pulmonary opacity, consistent with edema or atypical/viral infection. No focal airspace opacity. Electronically Signed   By: Jearld Lesch M.D.   On: 08/17/2023 15:31    Procedures Procedures    Medications Ordered in ED Medications  0.9 %  sodium chloride infusion (Manually program via Guardrails IV Fluids) (has no administration in time range)  lactated ringers bolus 1,000 mL (has no administration in time range)  sodium chloride 0.9 % bolus 1,000 mL (1,000 mLs Intravenous New Bag/Given 08/17/23 1441)  pantoprazole (PROTONIX) injection 40 mg (40 mg Intravenous Given 08/17/23 1456)  0.9 %  sodium chloride infusion (Manually program via Guardrails IV Fluids) ( Intravenous New Bag/Given 08/17/23 1533)    ED Course/ Medical Decision Making/ A&P                                 Medical Decision Making Amount and/or Complexity of Data  Reviewed Labs: ordered. Radiology: ordered.  Risk Prescription drug management. Decision regarding hospitalization.   This patient presents to the ED for concern of syncope, this involves an extensive number of treatment options, and is a complaint that carries with it a high risk of complications and morbidity.  The differential diagnosis includes hypotension, anemia, electrolyte abn   Co morbidities that complicate the patient evaluation   htn, dm2, hld, ckd, arthritis, cva, RA, paroxysmal af (on Eliquis)   Additional history obtained:  Additional history obtained from epic chart review External records from outside source obtained and reviewed including EMS report   Lab Tests:  I Ordered, and personally interpreted labs.  The pertinent results include:  cbc with hgb 7.5 (hgb 10.5 on 8/29), LFTs nl, inr 2.1; trop elevated at 546; bun elevated at 70 and bun elevated at 2.39 (bun 43 and cr 1.65 on 8/29)   Imaging Studies ordered:  I ordered imaging studies including cxr  I independently visualized and interpreted imaging which showed  htn, dm2, hld, ckd, arthritis, cva, RA, paroxysmal af (on Eliquis) I agree with the radiologist interpretation   Cardiac Monitoring:  The patient was maintained on a cardiac monitor.  I personally viewed and interpreted the cardiac monitored which showed an underlying rhythm of: afib   Medicines ordered and prescription drug management:  I ordered medication including protonix, ivfs, transfusion  for gi bleed  Reevaluation of the patient after these medicines showed that the patient improved I have reviewed the patients home medicines and have made adjustments as needed   Critical Interventions:  transfusion   Consultations Obtained:  I requested consultation with the gastroenterologist (  Dr. Jena Gauss),  and discussed lab and imaging findings as well as pertinent plan - he recommends ppi, holding eliquis, transfusion.  He will see  pt. Pt d/w Dr. Randol Kern ([facility]) who will admit.   Problem List / ED Course:  UGI bleed on apix:  protonix started.  Transfusion started.  GI consulted.  Symptomatic anemia:  transfusion ordered Elevated trop:  pt is not having cp.  Will need to trend.   Reevaluation:  After the interventions noted above, I reevaluated the patient and found that they have :improved   Social Determinants of Health:  Lives alone   Dispostion:  After consideration of the diagnostic results and the patients response to treatment, I feel that the patent would benefit from admission.  CRITICAL CARE Performed by: Jacalyn Lefevre   Total critical care time: 45 minutes  Critical care time was exclusive of separately billable procedures and treating other patients.  Critical care was necessary to treat or prevent imminent or life-threatening deterioration.  Critical care was time spent personally by me on the following activities: development of treatment plan with patient and/or surrogate as well as nursing, discussions with consultants, evaluation of patient's response to treatment, examination of patient, obtaining history from patient or surrogate, ordering and performing treatments and interventions, ordering and review of laboratory studies, ordering and review of radiographic studies, pulse oximetry and re-evaluation of patient's condition.           Final Clinical Impression(s) / ED Diagnoses Final diagnoses:  Acute GI bleeding  Symptomatic anemia  Paroxysmal atrial fibrillation (HCC)  On apixaban therapy  AKI (acute kidney injury) (HCC)  Elevated troponin    Rx / DC Orders ED Discharge Orders     None         Jacalyn Lefevre, MD 08/17/23 1601

## 2023-08-18 ENCOUNTER — Inpatient Hospital Stay (HOSPITAL_COMMUNITY): Payer: Medicare Other

## 2023-08-18 DIAGNOSIS — I959 Hypotension, unspecified: Secondary | ICD-10-CM | POA: Diagnosis not present

## 2023-08-18 DIAGNOSIS — D649 Anemia, unspecified: Secondary | ICD-10-CM

## 2023-08-18 DIAGNOSIS — I5030 Unspecified diastolic (congestive) heart failure: Secondary | ICD-10-CM | POA: Diagnosis not present

## 2023-08-18 DIAGNOSIS — I480 Paroxysmal atrial fibrillation: Secondary | ICD-10-CM

## 2023-08-18 DIAGNOSIS — N179 Acute kidney failure, unspecified: Secondary | ICD-10-CM | POA: Diagnosis not present

## 2023-08-18 DIAGNOSIS — Z7901 Long term (current) use of anticoagulants: Secondary | ICD-10-CM

## 2023-08-18 DIAGNOSIS — R0609 Other forms of dyspnea: Secondary | ICD-10-CM

## 2023-08-18 DIAGNOSIS — J9601 Acute respiratory failure with hypoxia: Secondary | ICD-10-CM | POA: Diagnosis not present

## 2023-08-18 LAB — TYPE AND SCREEN
ABO/RH(D): O POS
Antibody Screen: NEGATIVE
Unit division: 0
Unit division: 0

## 2023-08-18 LAB — ECHOCARDIOGRAM COMPLETE
AR max vel: 0.64 cm2
AV Area VTI: 0.67 cm2
AV Area mean vel: 0.66 cm2
AV Mean grad: 7.6 mm[Hg]
AV Peak grad: 14.5 mm[Hg]
Ao pk vel: 1.9 m/s
Area-P 1/2: 4.19 cm2
Calc EF: 45.3 %
Height: 68 in
MV M vel: 4.6 m/s
MV Peak grad: 84.8 mm[Hg]
MV VTI: 0.98 cm2
Radius: 0.3 cm
S' Lateral: 3.5 cm
Single Plane A2C EF: 42.5 %
Single Plane A4C EF: 42.1 %
Weight: 3216.95 [oz_av]

## 2023-08-18 LAB — BPAM RBC
Blood Product Expiration Date: 202411292359
Blood Product Expiration Date: 202412062359
ISSUE DATE / TIME: 202411231520
ISSUE DATE / TIME: 202411231647
Unit Type and Rh: 5100
Unit Type and Rh: 9500

## 2023-08-18 LAB — GLUCOSE, CAPILLARY
Glucose-Capillary: 139 mg/dL — ABNORMAL HIGH (ref 70–99)
Glucose-Capillary: 109 mg/dL — ABNORMAL HIGH (ref 70–99)
Glucose-Capillary: 99 mg/dL (ref 70–99)
Glucose-Capillary: 90 mg/dL (ref 70–99)

## 2023-08-18 LAB — BASIC METABOLIC PANEL
Anion gap: 11 (ref 5–15)
BUN: 67 mg/dL — ABNORMAL HIGH (ref 8–23)
CO2: 20 mmol/L — ABNORMAL LOW (ref 22–32)
Calcium: 9.1 mg/dL (ref 8.9–10.3)
Chloride: 105 mmol/L (ref 98–111)
Creatinine, Ser: 2.46 mg/dL — ABNORMAL HIGH (ref 0.61–1.24)
GFR, Estimated: 26 mL/min — ABNORMAL LOW (ref >60)
Glucose, Bld: 139 mg/dL — ABNORMAL HIGH (ref 70–99)
Potassium: 3.9 mmol/L (ref 3.5–5.1)
Sodium: 136 mmol/L (ref 135–145)

## 2023-08-18 LAB — TROPONIN I (HIGH SENSITIVITY)
Troponin I (High Sensitivity): 1979 ng/L (ref <18)
Troponin I (High Sensitivity): 2079 ng/L (ref <18)

## 2023-08-18 LAB — HEMOGLOBIN AND HEMATOCRIT, BLOOD
HCT: 26.8 % — ABNORMAL LOW (ref 39.0–52.0)
HCT: 29.5 % — ABNORMAL LOW (ref 39.0–52.0)
HCT: 26.6 % — ABNORMAL LOW (ref 39.0–52.0)
Hemoglobin: 9.2 g/dL — ABNORMAL LOW (ref 13.0–17.0)
Hemoglobin: 10 g/dL — ABNORMAL LOW (ref 13.0–17.0)
Hemoglobin: 9.1 g/dL — ABNORMAL LOW (ref 13.0–17.0)

## 2023-08-18 LAB — PROTIME-INR
INR: 1.7 — ABNORMAL HIGH (ref 0.8–1.2)
Prothrombin Time: 20.1 s — ABNORMAL HIGH (ref 11.4–15.2)

## 2023-08-18 LAB — PHOSPHORUS: Phosphorus: 5.1 mg/dL — ABNORMAL HIGH (ref 2.5–4.6)

## 2023-08-18 LAB — MAGNESIUM: Magnesium: 1.6 mg/dL — ABNORMAL LOW (ref 1.7–2.4)

## 2023-08-18 MED ORDER — MAGNESIUM SULFATE IN D5W 1-5 GM/100ML-% IV SOLN
1.0000 g | Freq: Once | INTRAVENOUS | Status: AC
  Administered 2023-08-18: 1 g via INTRAVENOUS
  Filled 2023-08-18: qty 100

## 2023-08-18 MED ORDER — PANTOPRAZOLE SODIUM 40 MG IV SOLR
40.0000 mg | Freq: Two times a day (BID) | INTRAVENOUS | Status: DC
  Administered 2023-08-18 – 2023-08-19 (×3): 40 mg via INTRAVENOUS
  Filled 2023-08-18 (×3): qty 10

## 2023-08-18 MED ORDER — PERFLUTREN LIPID MICROSPHERE
1.0000 mL | INTRAVENOUS | Status: AC | PRN
Start: 2023-08-18 — End: 2023-08-18
  Administered 2023-08-18: 2 mL via INTRAVENOUS

## 2023-08-18 MED ORDER — INSULIN ASPART 100 UNIT/ML IJ SOLN
0.0000 [IU] | Freq: Three times a day (TID) | INTRAMUSCULAR | Status: DC
  Administered 2023-08-19 (×3): 2 [IU] via SUBCUTANEOUS

## 2023-08-18 NOTE — Progress Notes (Signed)
eLink Physician-Brief Progress Note Patient Name: Connor Bryant DOB: [birth year] MRN: [id number]   Date of Service  08/18/2023  HPI/Events of Note  Magnesium 1.6  eICU Interventions  replaced     Intervention Category Intermediate Interventions: Electrolyte abnormality - evaluation and management  Henry Russel, P 08/18/2023, 4:13 AM

## 2023-08-18 NOTE — Consult Note (Addendum)
Attending physician's note   I have taken a history, reviewed the chart, and examined the patient. I performed a substantive portion of this encounter, including complete performance of at least one of the key components, in conjunction with the APP. I agree with the APP's note, impression, and recommendations with my edits.   77 year old male with medical history as outlined below, to include history of RA (on MTX, prednisone), A-fib (on Eliquis), diabetes, CKD 3, CVA, PE, initially presenting to [facility] with weakness, lightheadedness for the last couple of weeks with symptoms progressing and syncopal episode with associated hypotension.  No wrap ordered melena or hematochezia.  Started oral iron a few weeks ago on his own.  Admission evaluation notable for the following: - H/H 7.5/23 transfused 2 units with repeat 9.5/28 (baseline Hgb ~10.5) - INR 2.1 --> 1.7 (after Kcentra) - Troponin 546 --> 461 --> 558 - BUN/creatinine 70/2.4 (baseline creatinine~1.5) - FOBT positive  1) Acute on chronic anemia 2) Heme positive stool 3) Symptomatic anemia - Started on high-dose PPI - CBCs have been stable since 2 unit RBC transfusion.  Continue daily CBC checks - Holding Eliquis - Tentative plan for EGD later during this admission after optimization of cardiopulmonary status  4) Elevated troponin 5) CHF with acute decompensation - Suspected acute decompensation 2/2 volume overload related to RBC transfusion and IV fluids - Management per primary CCS  6) AKI on CKD - Daily BMP with electrolyte repletion per protocol and per primary CCS  7) Rheumatoid arthritis - Recently started on MTX and has otherwise been on steroids.  Possible steroid related gastritis  GI service will continue to follow.  Dr. Leonides Schanz will assume his ongoing inpatient GI care starting tomorrow morning.  [address] DO, [place] [phone] office         Consultation  Referring Provider:  CCM/Hunsucker Primary Care Physician:  Mechele Claude, MD Primary Gastroenterologist:  Dr.Gessner  Reason for Consultation: Anemia, melena in setting of Eliquis  HPI: Connor Bryant is a 77 y.o. male with multiple significant comorbidities, on chronic Eliquis for history of atrial fibrillation, and prior CVA/PE in 2021.  Patient had presented to the emergency room at [facility] yesterday, with progressive weakness over the past several days, then increased shortness of breath, and dizziness.  He called EMS, initially found to be hypotensive with blood pressure of 60, he was given a liter of saline with BP up into the 80s, he had taken his blood pressure medicines yesterday, and not also taken his Eliquis.  Labs were done showing hemoglobin of 7.5 down from his baseline of about 10.52 months ago.  Etiology of low-tension unclear whether related to anemia versus other.  He was admitted transfused 2 units of packed RBCs, then developed hypoxic respiratory failure.  Decision was made to transfer him to [facility] Lactate was normal, troponins in the 500-600 range Was given Kcentra, after initial INR noted to be 2.1  There was concern for GI bleed, though patient says he had 1 bowel movement yesterday morning and had been noticing dark bowel movements over the past couple of weeks intermittently but no overt black tarry stool, and no hematochezia.  He says he started himself on iron a few weeks ago because his stools were lighter than usual and he thought he may need more iron. He had also been taking prednisone the past few months as well as methotrexate after diagnosis of rheumatoid arthritis.  He says he was weaned  off of prednisone just last week.  Not had any increased frequency of bowel movements, having usually 1 bowel movement per day, no nausea or vomiting, no hematemesis, no heartburn or indigestion, no abdominal pain.  After transfer here last night noted to have persistent hypoxic respiratory  failure and critical care was consulted.  Felt to be acute decompensated heart failure likely volume overload transfusions and fluids  Today hemoglobin 9.5 after transfusions, INR currently 1.7 No bowel movements today. He does not have any prior history of GI bleeding baseline hemoglobin 10.5 about 2 months ago  He did have prior EGD and colonoscopy remotely with Dr. Leone Payor both done in 2015.  At EGD he had patchy gastritis, biopsies were done showing reactive gastropathy no H. pylori Colonoscopy he had 2 small shallow ulcers in the ascending colon and a 5 mm rectal polyp removed noted to have diverticulosis in the sigmoid colon.  Path and a polyp consistent with tubular adenoma right colon biopsy showed benign mucosa with surface erosion.  Says he was having symptoms at that time with abdominal pain etc. 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 Hart Rochester.  He is stable today, improved but still requiring oxygen at 5 L Last troponin 558  Currently on every 6 hours IV PPI   Past Medical History:  Diagnosis Date   Acute CVA (cerebrovascular accident) (HCC) 05/01/2020   Acute respiratory failure with hypoxia (HCC) 05/04/2020   Arthritis    DM type 2 (diabetes mellitus, type 2) (HCC)    no meds   Endotracheally intubated    Gallstone    Heart murmur    was told one time he had a murmur, not since 30 years ago   HTN (hypertension)    Hyperlipidemia    Personal history of colonic polyp-adenoma 11/24/2013   Renal insufficiency    mild   Thoracic spine fracture [facility])     Past Surgical History:  Procedure Laterality Date   ABDOMINAL AORTAGRAM N/A 02/23/2014   Procedure: ABDOMINAL Ronny Flurry;  Surgeon: Fransisco Hertz, MD;  Location: [facility] CATH LAB;  Service: Cardiovascular;  Laterality: N/A;   CATARACT EXTRACTION, BILATERAL     COLONOSCOPY     DEBRIDEMENT TENNIS ELBOW Right    ESOPHAGOGASTRODUODENOSCOPY      MESENTERIC ARTERY BYPASS N/A 03/10/2014   Procedure: AORTO TO SUPERIOR MESENTERIC ARTERY BYPASS;  Surgeon: Pryor Ochoa, MD;  Location: [facility] OR;  Service: Vascular;  Laterality: N/A;   TONSILLECTOMY AND ADENOIDECTOMY     vascualr surgery     illiac stents    Prior to Admission medications   Medication Sig Start Date End Date Taking? Authorizing Provider  allopurinol (ZYLOPRIM) 100 MG tablet TAKE 1 TABLET 2 TO 3 TIMES A DAY FOR GOUT Patient taking differently: Take 100 mg by mouth daily. 07/17/23  Yes Stacks, Broadus John, MD  amLODipine (NORVASC) 5 MG tablet Take 1 tablet (5 mg total) by mouth daily. For blood pressure 11/20/22  Yes Stacks, Broadus John, MD  apixaban (ELIQUIS) 5 MG TABS tablet Take 1 tablet (5 mg total) by mouth 2 (two) times daily. 11/20/22  Yes Mechele Claude, MD  atorvastatin (LIPITOR) 80 MG tablet Take 1 tablet (80 mg total) by mouth daily. 11/20/22  Yes Mechele Claude, MD  calcium carbonate (OSCAL) 1500 (600 Ca) MG TABS tablet Take 600 mg of elemental calcium by mouth daily with breakfast.   Yes [provider]  carvedilol (COREG) 25  MG tablet TAKE ONE TABLET TWICE DAILY 07/23/23  Yes Branch, Dorothe Pea, MD  chlorthalidone (HYGROTON) 25 MG tablet Take 25 mg by mouth every morning. 12/12/21  Yes [provider]  ferrous sulfate 325 (65 FE) MG EC tablet Take 325 mg by mouth daily with breakfast.   Yes [provider]  folic acid (FOLVITE) 1 MG tablet Take 1 tablet (1 mg total) by mouth daily. 08/01/23  Yes Stacks, Broadus John, MD  glimepiride (AMARYL) 2 MG tablet TAKE ONE TABLET ONCE DAILY EVERY MORNING Patient taking differently: Take 2 mg by mouth daily with breakfast. 11/20/22  Yes Mechele Claude, MD  hydrALAZINE (APRESOLINE) 100 MG tablet Take 1 tablet (100 mg total) by mouth 3 (three) times daily. Patient taking differently: Take 100 mg by mouth 2 (two) times daily. 11/20/22  Yes Mechele Claude, MD  isosorbide mononitrate (IMDUR) 30 MG 24 hr tablet TAKE ONE TABLET  BY MOUTH DAILY 11/28/22  Yes Branch, Dorothe Pea, MD  levocetirizine (XYZAL) 5 MG tablet TAKE ONE TABLET EVERY EVENING 12/18/22  Yes Mechele Claude, MD  lisinopril (ZESTRIL) 20 MG tablet Take 1 tablet (20 mg total) by mouth daily. 11/20/22 11/15/23 Yes Stacks, Broadus John, MD  methotrexate (RHEUMATREX) 2.5 MG tablet Take 4 tablets (10 mg total) by mouth once a week. Caution:Chemotherapy. Protect from light. Patient taking differently: Take 5 mg by mouth once a week. Caution:Chemotherapy. Protect from light. 08/01/23  Yes Stacks, Broadus John, MD  polyethylene glycol (MIRALAX / GLYCOLAX) packet Take 17 g by mouth daily. 10/02/18  Yes Leatha Gilding, MD  predniSONE (DELTASONE) 10 MG tablet Take 1 tablet (10 mg total) by mouth daily with breakfast. 06/25/23  Yes Mechele Claude, MD  pyridOXINE (B-6) 50 MG tablet Take 1 tablet (50 mg total) by mouth daily. 08/01/23  Yes Mechele Claude, MD  Blood Glucose Monitoring Suppl DEVI 1 each by Does not apply route in the morning, at noon, and at bedtime. May substitute to any manufacturer covered by patient's insurance. 06/25/23   Mechele Claude, MD    Current Facility-Administered Medications  Medication Dose Route Frequency Provider Last Rate Last Admin   allopurinol (ZYLOPRIM) tablet 100 mg  100 mg Oral Daily Duayne Cal, NP   100 mg at 08/18/23 1105   Chlorhexidine Gluconate Cloth 2 % PADS 6 each  6 each Topical Q0600 Hunsucker, Lesia Sago, MD   6 each at 08/17/23 1910   docusate sodium (COLACE) capsule 100 mg  100 mg Oral BID PRN Duayne Cal, NP       folic acid (FOLVITE) tablet 1 mg  1 mg Oral Daily Duayne Cal, NP   1 mg at 08/18/23 1105   hydrocortisone sodium succinate (SOLU-CORTEF) 100 MG injection 100 mg  100 mg Intravenous Q8H Elgergawy, Leana Roe, MD   100 mg at 08/18/23 0859   insulin aspart (novoLOG) injection 0-15 Units  0-15 Units Subcutaneous Q4H Duayne Cal, NP   2 Units at 08/18/23 0320   levalbuterol (XOPENEX) nebulizer solution 0.63 mg  0.63 mg  Nebulization Q3H PRN Duayne Cal, NP       Oral care mouth rinse  15 mL Mouth Rinse 4 times per day Lynnell Catalan, MD   15 mL at 08/17/23 2141   Oral care mouth rinse  15 mL Mouth Rinse PRN Lynnell Catalan, MD       pantoprazole (PROTONIX) injection 40 mg  40 mg Intravenous Q6H Esterwood, Amy S, PA-C   40 mg at 08/18/23 1105  Followed by   Melene Muller ON 08/20/2023] pantoprazole (PROTONIX) injection 40 mg  40 mg Intravenous Q12H Esterwood, Amy S, PA-C       polyethylene glycol (MIRALAX / GLYCOLAX) packet 17 g  17 g Oral Daily PRN Duayne Cal, NP        Allergies as of 08/17/2023   (No Known Allergies)    Family History  Problem Relation Age of Onset   Diabetes Mother    Heart disease Mother        before age 24   Hyperlipidemia Mother    Hypertension Mother    AAA (abdominal aortic aneurysm) Mother    Stroke Father    Hypertension Father    Coronary artery disease Father    Hyperlipidemia Father    Diabetes Father    Bladder Cancer Father    Cancer Father    Heart disease Father    Heart attack Father    Colon cancer Neg Hx    Throat cancer Neg Hx    Kidney disease Neg Hx    Liver disease Neg Hx     Social History   Socioeconomic History   Marital status: Married    Spouse name: Not on file   Number of children: Not on file   Years of education: Not on file   Highest education level: Not on file  Occupational History   Not on file  Tobacco Use   Smoking status: Former    Current packs/day: 0.00    Types: Cigarettes    Quit date: 07/08/2009    Years since quitting: 14.1   Smokeless tobacco: Never  Vaping Use   Vaping status: Never Used  Substance and Sexual Activity   Alcohol use: Yes    Alcohol/week: 12.0 standard drinks of alcohol    Types: 12 Cans of beer per week    Comment: 8 beers a week   Drug use: Not Currently   Sexual activity: Yes  Other Topics Concern   Not on file  Social History Narrative   Patient is married, he is retired. 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   Social Determinants of Health   Financial Resource Strain: Low Risk  (12/03/2022)   Overall Financial Resource Strain (CARDIA)    Difficulty of Paying Living Expenses: Not hard at all  Food Insecurity: No Food Insecurity (12/03/2022)   Hunger Vital Sign    Worried About Running Out of Food in the Last Year: Never true    Ran Out of Food in the Last Year: Never true  Transportation Needs: No Transportation Needs (12/03/2022)   PRAPARE - Administrator, Civil Service (Medical): No    Lack of Transportation (Non-Medical): No  Physical Activity: Insufficiently Active (12/03/2022)   Exercise Vital Sign    Days of Exercise per Week: 3 days    Minutes of Exercise per Session: 30 min  Stress: No Stress Concern Present (12/03/2022)   [employer] of Occupational Health - Occupational Stress Questionnaire    Feeling of Stress : Not at all  Social Connections: Moderately Isolated (12/03/2022)   Social Connection and Isolation Panel [NHANES]    Frequency of Communication with Friends and Family: More than three times a week    Frequency of Social Gatherings with Friends and Family: More than three times a week    Attends Religious Services: Never    Database administrator or Organizations: No    Attends Banker  Meetings: Never    Marital Status: Married  Catering manager Violence: Not At Risk (12/03/2022)   Humiliation, Afraid, Rape, and Kick questionnaire    Fear of Current or Ex-Partner: No    Emotionally Abused: No    Physically Abused: No    Sexually Abused: No    Review of Systems:  Pertinent positive and negative review of systems were noted in the above HPI section.  All other review of systems was otherwise negative.   Physical Exam: Vital signs in last 24 hours: Temp:  [96.8 F (36 C)-98.6 F (37 C)] 97.7 F (36.5 C) (11/24 1206) Pulse Rate:  [89-111] 99 (11/24 1200) Resp:  [12-29] 13 (11/24 1200) BP:  (72-133)/(42-86) 101/72 (11/24 1200) SpO2:  [2 %-100 %] 98 % (11/24 1200) FiO2 (%):  [50 %] 50 % (11/24 0400) Weight:  [91.2 kg] 91.2 kg (11/24 0320) Last BM Date :  (PTA) General:   Alert,  Well-developed, well-nourished, elderly white male pleasant and cooperative in NAD on oxygen at 5 L Head:  Normocephalic and atraumatic. Eyes:  Sclera clear, no icterus.   Conjunctiva pink. Ears:  Normal auditory acuity. Nose:  No deformity, discharge,  or lesions. Mouth:  No deformity or lesions.   Neck:  Supple; no masses or thyromegaly. Lungs:  Clear throughout to auscultation.   No wheezes, crackles, or rhonchi.  Heart:  irRegular rate and rhythm; no murmurs, clicks, rubs,  or gallops. Abdomen:  Soft,nontender, BS active,nonpalp mass or hsm.   Rectal: not done  Msk:  Symmetrical without gross deformities. . Pulses:  Normal pulses noted. Extremities:  Without clubbing or edema. Neurologic:  Alert and  oriented x4;  grossly normal neurologically. Skin:  Intact without significant lesions or rashes.. Psych:  Alert and cooperative. Normal mood and affect.  Intake/Output from previous day: 11/23 0701 - 11/24 0700 In: 2750.8 [I.V.:1020.5; Blood:656.8; IV Piggyback:1073.4] Out: 250 [Urine:250] Intake/Output this shift: No intake/output data recorded.  Lab Results: Recent Labs    08/17/23 1437 08/17/23 1939 08/17/23 2044 08/18/23 0311 08/18/23 1404  WBC 6.0  --   --   --   --   HGB 7.5*   < > 9.5* 9.2* 10.0*  HCT 23.3*   < > 28.4* 26.8* 29.5*  PLT 141*  --   --   --   --    < > = values in this interval not displayed.   BMET Recent Labs    08/17/23 1437 08/17/23 1939 08/18/23 0311  NA 136 137 136  K 4.0 4.2 3.9  CL 103  --  105  CO2 23  --  20*  GLUCOSE 137*  --  139*  BUN 70*  --  67*  CREATININE 2.39*  --  2.46*  CALCIUM 9.6  --  9.1   LFT Recent Labs    08/17/23 1437  PROT 6.2*  ALBUMIN 3.2*  AST 30  ALT 28  ALKPHOS 51  BILITOT 1.2*  BILIDIR 0.4*  IBILI 0.8    PT/INR Recent Labs    08/17/23 1437 08/18/23 0311  LABPROT 23.6* 20.1*  INR 2.1* 1.7*   Hepatitis Panel No results for input(s): "HEPBSAG", "HCVAB", "HEPAIGM", "HEPBIGM" in the last 72 hours.    IMPRESSION:  #51 77 year old white male presented to [facility] yesterday with progressive weakness over the past week, and lightheadedness dizziness yesterday, found hypotensive per EMS with initial systolic blood pressure in the 60s presyncopal.  No complaints of chest pain, had been short of breath  Workup in the ER found to be persistently hypotensive, and anemic with hemoglobin of 7.5, transfused x 2 and developed symptoms consistent with hypoxic respiratory failure volume overload  And had reported dark stools, no overt history of melena, given he was on Eliquis this was reversed with Kcentra   Transferred here last night  Elevated troponins but felt to be demand ischemia rather than ACS Required BiPAP last night, now on 5 L nasal O2  No stools or melena since admission Last dose Eliquis yesterday  Current picture not consistent with an active  GI bleed, suspect this has been subacute blood loss in the setting of Eliquis and prednisone Possible peptic ulcer disease, gastropathy  #2 elevated troponins as above-question demand ischemia #3 new diagnosis of rheumatoid arthritis, has been on methotrexate and prednisone over the past few months, weaned off prednisone last week #4 acute kidney injury on chronic kidney disease stage III # 5 Diabetes mellitus  #6 respiratory distress-improved but managing for pulmonary edema-requiring 5 L  #7 2D echo today shows new finding of EF 30 to 35% with global hypokinesis -  #8 history of CVA/PE 2021 #9 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  GI will follow with you     Amy EsterwoodPA-C  08/18/2023, 3:21 PM

## 2023-08-18 NOTE — Progress Notes (Signed)
   08/18/23 1814  Assess: MEWS Score  Temp (!) 97.5 F (36.4 C)  BP 95/63  MAP (mmHg) 73  Pulse Rate (!) 116  ECG Heart Rate (!) 114  Resp 20  Level of Consciousness Alert  SpO2 94 %  O2 Device Nasal Cannula  O2 Flow Rate (L/min) 2 L/min  Assess: MEWS Score  MEWS Temp 0  MEWS Systolic 1  MEWS Pulse 2  MEWS RR 0  MEWS LOC 0  MEWS Score 3  MEWS Score Color Yellow  Assess: if the MEWS score is Yellow or Red  Were vital signs accurate and taken at a resting state? Yes  Does the patient meet 2 or more of the SIRS criteria? No  MEWS guidelines implemented  Yes, yellow  Treat  MEWS Interventions Considered administering scheduled or prn medications/treatments as ordered  Take Vital Signs  Increase Vital Sign Frequency  Yellow: Q2hr x1, continue Q4hrs until patient remains green for 12hrs  Escalate  MEWS: Escalate Yellow: Discuss with charge nurse and consider notifying provider and/or RRT  Notify: Charge Nurse/RN  Name of Charge Nurse/RN Notified Eduardo Osier, RN  Assess: SIRS CRITERIA  SIRS Temperature  0  SIRS Pulse 1  SIRS Respirations  0  SIRS WBC 0  SIRS Score Sum  1

## 2023-08-18 NOTE — Progress Notes (Signed)
  Echocardiogram 2D Echocardiogram has been performed.  Ocie Doyne RDCS 08/18/2023, 10:50 AM

## 2023-08-18 NOTE — Progress Notes (Signed)
NAME:  Connor Bryant, MRN:  [id number], DOB:  [birth year], LOS: 1 ADMISSION DATE:  08/17/2023, CONSULTATION DATE:  11/23 REFERRING MD:  Dr. Posey Rea EDP, CHIEF COMPLAINT:  GI bleed   History of Present Illness:  77 year old male with PMH as below, which is significant for RA recently started on methotrexate, prednisone, Atrial fibrillation and Eliquis, DM, HLD, and stroke.  He presented to [facility] emergency department on 11/23 with complaints of weakness and lightheadedness starting 2 weeks prior to presentation.  Symptoms really intensified about 1 week prior to arrival.  On the day of presentation he experienced near syncope when rising to a standing position from sitting which prompted him to present to the emergency department via EMS.  Upon EMS arrival systolic blood pressure was noted to be 58.  He was infused with 1 L of normal saline and systolic blood pressure improved to the 80s.  He has been taking his multiple antihypertensives as prescribed.  Also described dark stools intermittently over period of weeks.  Laboratory evaluation significant for hemoglobin 7.5 (which was 10.5 in August), INR 2.1, troponin elevated at 546, BUN 70, and creatinine 2.39.  Despite 2 units of packed red blood cells and IV fluid resuscitation borderline hypotension remained and the patient was transferred to [facility] for ICU care with PCCM admitting.  Pertinent  Medical History   has a past medical history of Acute CVA (cerebrovascular accident) (HCC) (05/01/2020), Acute respiratory failure with hypoxia (HCC) (05/04/2020), Arthritis, DM type 2 (diabetes mellitus, type 2) (HCC), Endotracheally intubated, Gallstone, Heart murmur, HTN (hypertension), Hyperlipidemia, Personal history of colonic polyp-adenoma (11/24/2013), Renal insufficiency, and Thoracic spine fracture (HCC).   Significant Hospital Events: Including procedures, antibiotic start and stop dates in addition to other pertinent events   11/23 admitted for  hypotension secondary to acute blood loss anemia and GI bleed  Interim History / Subjective:  Got some Lasix.  Pedal little bit.  Stable on BiPAP.  Objective   Blood pressure 90/60, pulse 97, temperature (!) 96.8 F (36 C), temperature source Axillary, resp. rate 13, height 5\' 8"  (1.727 m), weight 91.2 kg, SpO2 99%.    Vent Mode: BIPAP;PCV FiO2 (%):  [50 %] 50 % PEEP:  [5 cmH20] 5 cmH20   Intake/Output Summary (Last 24 hours) at 08/18/2023 0916 Last data filed at 08/18/2023 0600 Gross per 24 hour  Intake 2750.76 ml  Output 250 ml  Net 2500.76 ml   Filed Weights   08/17/23 1451 08/17/23 1905 08/18/23 0320  Weight: 89.4 kg 91.2 kg 91.2 kg    Examination: General: Elderly appearing male in no acute distress HENT: Normocephalic, atraumatic, PERRL Lungs: No work of breathing on BiPAP speaks in full sentences Cardiovascular: Regular rate and rhythm, no peripheral edema Abdomen: Soft, nontender, nondistended Extremities: No acute deformity or range of motion imitation Neuro: Alert, oriented, non-focal  Resolved Hospital Problem list     Assessment & Plan:   Hypotension: Secondary to GI bleed and acute blood loss anemia.  Blood pressure appears to be improved now status post 2 units of packed red blood cells and IV fluids.  Fecal occult blood positive. -Status post 2 units of PRBC with appropriate response and stable hemoglobins on serial checks -Holding home amlodipine, carvedilol, chlorthalidone, hydralazine, isosorbide, lisinopril,   Gastrointestinal bleed: He is a 2-3 beer a day drinker which stopped about 1 month ago.  Does not use NSAIDs.  Takes Eliquis, prednisone, and methotrexate all increasing risk.  Acute blood loss anemia -  Holding Eliquis, status post Kcentra reversal at outside hospital ED -Protonix twice daily -GI consult appreciate assistance  Respiratory distress: No real hypoxia, however, was 30-40 times a minute on arrival to ICU from outside hospital.   Crackles and wheeze on exam.  Concerned he has some pulmonary edema from transfusion. No history of COPD/Asthma. -Chest x-ray appears a bit wet, s/p lasix 11/23 - will hold given borderline BPs -- Reported wheeze initially, bronchodilators  AKI on CKD 3a  - hemodynamic support, blood transfusion -Got Lasix 11/23 for pulmonary edema, suspect is actually hypovolemic and may be reason for lack of improvement in creatinine however with chest x-ray appears a bit wet and difficult to get more fluids  Diabetes mellitus - holding home glimepiride - CBG monitoring and SSI  RA - seems like intermittent compliance with both methotrexate and prednisone. Hasn't been taking per med rec. Will hold.  -- Continue stress dose steroids given report of chronic prednisone use, plan 48 hours then taper off  Gout - continue allopurinol  If stable throughout morning suspect can transfer out of the ICU.  Best Practice (right click and "Reselect all SmartList Selections" daily)   Diet/type: NPO DVT prophylaxis: SCDs Start: 08/17/23 1921SCD   Pressure ulcer(s): not present on admission  GI prophylaxis: PPI Lines: N/A Foley:  N/A Code Status:  full code - per discussion 11/23 Last date of multidisciplinary goals of care discussion [patient updated at bedside]  Labs   CBC: Recent Labs  Lab 08/17/23 1437 08/17/23 1939 08/17/23 2044 08/18/23 0311  WBC 6.0  --   --   --   HGB 7.5* 9.5* 9.5* 9.2*  HCT 23.3* 28.0* 28.4* 26.8*  MCV 98.3  --   --   --   PLT 141*  --   --   --     Basic Metabolic Panel: Recent Labs  Lab 08/17/23 1437 08/17/23 1939 08/18/23 0311  NA 136 137 136  K 4.0 4.2 3.9  CL 103  --  105  CO2 23  --  20*  GLUCOSE 137*  --  139*  BUN 70*  --  67*  CREATININE 2.39*  --  2.46*  CALCIUM 9.6  --  9.1  MG  --   --  1.6*  PHOS  --   --  5.1*   GFR: Estimated Creatinine Clearance: 27.1 mL/min (A) (by C-G formula based on SCr of 2.46 mg/dL (H)). Recent Labs  Lab  08/17/23 1437 08/17/23 1621  WBC 6.0  --   LATICACIDVEN  --  1.7    Liver Function Tests: Recent Labs  Lab 08/17/23 1437  AST 30  ALT 28  ALKPHOS 51  BILITOT 1.2*  PROT 6.2*  ALBUMIN 3.2*   No results for input(s): "LIPASE", "AMYLASE" in the last 168 hours. No results for input(s): "AMMONIA" in the last 168 hours.  ABG    Component Value Date/Time   PHART 7.336 (L) 08/17/2023 1939   PCO2ART 36.9 08/17/2023 1939   PO2ART 121 (H) 08/17/2023 1939   HCO3 19.7 (L) 08/17/2023 1939   TCO2 21 (L) 08/17/2023 1939   ACIDBASEDEF 6.0 (H) 08/17/2023 1939   O2SAT 99 08/17/2023 1939     Coagulation Profile: Recent Labs  Lab 08/17/23 1437 08/18/23 0311  INR 2.1* 1.7*    Cardiac Enzymes: No results for input(s): "CKTOTAL", "CKMB", "CKMBINDEX", "TROPONINI" in the last 168 hours.  HbA1C: HB A1C (BAYER DCA - WAIVED)  Date/Time Value Ref Range Status  05/23/2023 02:28 PM  6.3 (H) 4.8 - 5.6 % Final    Comment:             Prediabetes: 5.7 - 6.4          Diabetes: >6.4          Glycemic control for adults with diabetes: <7.0   02/19/2023 03:25 PM 5.3 4.8 - 5.6 % Final    Comment:             Prediabetes: 5.7 - 6.4          Diabetes: >6.4          Glycemic control for adults with diabetes: <7.0    Hgb A1c MFr Bld  Date/Time Value Ref Range Status  08/17/2023 08:44 PM 6.8 (H) 4.8 - 5.6 % Final    Comment:    (NOTE) Pre diabetes:          5.7%-6.4%  Diabetes:              >6.4%  Glycemic control for   <7.0% adults with diabetes     CBG: Recent Labs  Lab 08/17/23 1857 08/17/23 2313 08/18/23 0309 08/18/23 0757  GLUCAP 195* 167* 139* 109*    Review of Systems:   N/a   Past Medical History:  He,  has a past medical history of Acute CVA (cerebrovascular accident) (HCC) (05/01/2020), Acute respiratory failure with hypoxia (HCC) (05/04/2020), Arthritis, DM type 2 (diabetes mellitus, type 2) (HCC), Endotracheally intubated, Gallstone, Heart murmur, HTN  (hypertension), Hyperlipidemia, Personal history of colonic polyp-adenoma (11/24/2013), Renal insufficiency, and Thoracic spine fracture (HCC).   Surgical History:   Past Surgical History:  Procedure Laterality Date   ABDOMINAL AORTAGRAM N/A 02/23/2014   Procedure: ABDOMINAL AORTAGRAM;  Surgeon: Fransisco Hertz, MD;  Location[facility] CATH LAB;  Service: Cardiovascular;  Laterality: N/A;   CATARACT EXTRACTION, BILATERAL     COLONOSCOPY     DEBRIDEMENT TENNIS ELBOW Right    ESOPHAGOGASTRODUODENOSCOPY     MESENTERIC ARTERY BYPASS N/A 03/10/2014   Procedure: AORTO TO SUPERIOR MESENTERIC ARTERY BYPASS;  Surgeon: Pryor Ochoa, MD;  Location: [facility] OR;  Service: Vascular;  Laterality: N/A;   TONSILLECTOMY AND ADENOIDECTOMY     vascualr surgery     illiac stents     Social History:   reports that he quit smoking about 14 years ago. His smoking use included cigarettes. He has never used smokeless tobacco. He reports current alcohol use of about 12.0 standard drinks of alcohol per week. He reports that he does not currently use drugs.   Family History:  His family history includes AAA (abdominal aortic aneurysm) in his mother; Bladder Cancer in his father; Cancer in his father; Coronary artery disease in his father; Diabetes in his father and mother; Heart attack in his father; Heart disease in his father and mother; Hyperlipidemia in his father and mother; Hypertension in his father and mother; Stroke in his father. There is no history of Colon cancer, Throat cancer, Kidney disease, or Liver disease.   Allergies No Known Allergies   Home Medications  Prior to Admission medications   Medication Sig Start Date End Date Taking? Authorizing Provider  allopurinol (ZYLOPRIM) 100 MG tablet TAKE 1 TABLET 2 TO 3 TIMES A DAY FOR GOUT Patient taking differently: Take 100 mg by mouth daily. 07/17/23  Yes Stacks, Broadus John, MD  amLODipine (NORVASC) 5 MG tablet Take 1 tablet (5 mg total) by mouth daily. For blood  pressure 11/20/22  Yes Mechele Claude, MD  apixaban (ELIQUIS) 5 MG TABS tablet Take 1 tablet (5 mg total) by mouth 2 (two) times daily. 11/20/22  Yes Mechele Claude, MD  atorvastatin (LIPITOR) 80 MG tablet Take 1 tablet (80 mg total) by mouth daily. 11/20/22  Yes Stacks, Broadus John, MD  calcium carbonate (OSCAL) 1500 (600 Ca) MG TABS tablet Take 600 mg of elemental calcium by mouth daily with breakfast.   Yes [provider]  carvedilol (COREG) 25 MG tablet TAKE ONE TABLET TWICE DAILY 07/23/23  Yes Branch, Dorothe Pea, MD  chlorthalidone (HYGROTON) 25 MG tablet Take 25 mg by mouth every morning. 12/12/21  Yes [provider]  ferrous sulfate 325 (65 FE) MG EC tablet Take 325 mg by mouth daily with breakfast.   Yes [provider]  folic acid (FOLVITE) 1 MG tablet Take 1 tablet (1 mg total) by mouth daily. 08/01/23  Yes Stacks, Broadus John, MD  glimepiride (AMARYL) 2 MG tablet TAKE ONE TABLET ONCE DAILY EVERY MORNING Patient taking differently: Take 2 mg by mouth daily with breakfast. 11/20/22  Yes Mechele Claude, MD  hydrALAZINE (APRESOLINE) 100 MG tablet Take 1 tablet (100 mg total) by mouth 3 (three) times daily. Patient taking differently: Take 100 mg by mouth 2 (two) times daily. 11/20/22  Yes Mechele Claude, MD  isosorbide mononitrate (IMDUR) 30 MG 24 hr tablet TAKE ONE TABLET BY MOUTH DAILY 11/28/22  Yes Branch, Dorothe Pea, MD  levocetirizine (XYZAL) 5 MG tablet TAKE ONE TABLET EVERY EVENING 12/18/22  Yes Mechele Claude, MD  lisinopril (ZESTRIL) 20 MG tablet Take 1 tablet (20 mg total) by mouth daily. 11/20/22 11/15/23 Yes Stacks, Broadus John, MD  methotrexate (RHEUMATREX) 2.5 MG tablet Take 4 tablets (10 mg total) by mouth once a week. Caution:Chemotherapy. Protect from light. Patient taking differently: Take 5 mg by mouth once a week. Caution:Chemotherapy. Protect from light. 08/01/23  Yes Stacks, Broadus John, MD  polyethylene glycol (MIRALAX / GLYCOLAX) packet Take 17 g by mouth daily. 10/02/18   Yes Leatha Gilding, MD  predniSONE (DELTASONE) 10 MG tablet Take 1 tablet (10 mg total) by mouth daily with breakfast. 06/25/23  Yes Mechele Claude, MD  pyridOXINE (B-6) 50 MG tablet Take 1 tablet (50 mg total) by mouth daily. 08/01/23  Yes Mechele Claude, MD  Blood Glucose Monitoring Suppl DEVI 1 each by Does not apply route in the morning, at noon, and at bedtime. May substitute to any manufacturer covered by patient's insurance. 06/25/23   Mechele Claude, MD     Critical care time:    CRITICAL CARE Performed by: Karren Burly   Total critical care time: 31 minutes  Critical care time was exclusive of separately billable procedures and treating other patients.  Critical care was necessary to treat or prevent imminent or life-threatening deterioration.  Critical care was time spent personally by me on the following activities: development of treatment plan with patient and/or surrogate as well as nursing, discussions with consultants, evaluation of patient's response to treatment, examination of patient, obtaining history from patient or surrogate, ordering and performing treatments and interventions, ordering and review of laboratory studies, ordering and review of radiographic studies, pulse oximetry and re-evaluation of patient's condition.    Karren Burly, MD See Loretha Stapler for personal pager PCCM on call pager [phone] until 7pm. Please call Elink 7p-7a. [phone]  08/18/2023 9:16 AM

## 2023-08-19 ENCOUNTER — Other Ambulatory Visit (HOSPITAL_COMMUNITY): Payer: Self-pay

## 2023-08-19 ENCOUNTER — Encounter (HOSPITAL_COMMUNITY): Payer: Self-pay | Admitting: Pulmonary Disease

## 2023-08-19 ENCOUNTER — Ambulatory Visit: Payer: Medicare Other | Admitting: Family Medicine

## 2023-08-19 DIAGNOSIS — Z7901 Long term (current) use of anticoagulants: Secondary | ICD-10-CM | POA: Diagnosis not present

## 2023-08-19 DIAGNOSIS — I480 Paroxysmal atrial fibrillation: Secondary | ICD-10-CM | POA: Diagnosis not present

## 2023-08-19 DIAGNOSIS — D649 Anemia, unspecified: Secondary | ICD-10-CM | POA: Diagnosis not present

## 2023-08-19 DIAGNOSIS — K922 Gastrointestinal hemorrhage, unspecified: Secondary | ICD-10-CM | POA: Diagnosis not present

## 2023-08-19 LAB — BASIC METABOLIC PANEL
Anion gap: 10 (ref 5–15)
BUN: 67 mg/dL — ABNORMAL HIGH (ref 8–23)
CO2: 22 mmol/L (ref 22–32)
Calcium: 9 mg/dL (ref 8.9–10.3)
Chloride: 106 mmol/L (ref 98–111)
Creatinine, Ser: 2.14 mg/dL — ABNORMAL HIGH (ref 0.61–1.24)
GFR, Estimated: 31 mL/min — ABNORMAL LOW (ref >60)
Glucose, Bld: 144 mg/dL — ABNORMAL HIGH (ref 70–99)
Potassium: 3.4 mmol/L — ABNORMAL LOW (ref 3.5–5.1)
Sodium: 138 mmol/L (ref 135–145)

## 2023-08-19 LAB — BRAIN NATRIURETIC PEPTIDE: B Natriuretic Peptide: 1228.1 pg/mL — ABNORMAL HIGH (ref 0.0–100.0)

## 2023-08-19 LAB — GLUCOSE, CAPILLARY
Glucose-Capillary: 140 mg/dL — ABNORMAL HIGH (ref 70–99)
Glucose-Capillary: 133 mg/dL — ABNORMAL HIGH (ref 70–99)
Glucose-Capillary: 127 mg/dL — ABNORMAL HIGH (ref 70–99)
Glucose-Capillary: 140 mg/dL — ABNORMAL HIGH (ref 70–99)

## 2023-08-19 LAB — HEMOGLOBIN AND HEMATOCRIT, BLOOD
HCT: 26.4 % — ABNORMAL LOW (ref 39.0–52.0)
Hemoglobin: 9.2 g/dL — ABNORMAL LOW (ref 13.0–17.0)

## 2023-08-19 MED ORDER — BOOST / RESOURCE BREEZE PO LIQD CUSTOM
1.0000 | Freq: Three times a day (TID) | ORAL | Status: DC
  Administered 2023-08-19 – 2023-08-20 (×2): 1 via ORAL

## 2023-08-19 MED ORDER — ACETAMINOPHEN 325 MG PO TABS: 650.0000 mg | ORAL_TABLET | Freq: Four times a day (QID) | ORAL | Status: DC | PRN

## 2023-08-19 MED ORDER — HYDROCORTISONE SOD SUC (PF) 100 MG IJ SOLR
50.0000 mg | Freq: Two times a day (BID) | INTRAMUSCULAR | Status: DC
  Administered 2023-08-20: 50 mg via INTRAVENOUS
  Filled 2023-08-19: qty 1

## 2023-08-19 NOTE — Progress Notes (Signed)
Connor Bryant  ZDG:[id number] DOB: 06-23-1946 DOA: 08/17/2023 PCP: Mechele Claude, MD    Brief Narrative:  77 year old with a history of RA on methotrexate and prednisone, chronic atrial fibrillation on Eliquis, remote PE, DM2, HLD, HTN, and prior CVA who presented to [facility] ER 11/23 with generalized weakness and lightheadedness that have been progressively worsening over 2 week timeframe.  EMS discovered a systolic blood pressure of 58.  He did report multiple weeks of dark stools.  Initial hemoglobin was found to be 7.5 with a BUN of 70 and creatinine of 2.4.  Hypotension persisted despite volume resuscitation and blood transfusion and therefore he was transferred to the [facility] ICU.  Goals of Care:   Code Status: Full Code   DVT prophylaxis: SCDs Start: 08/17/23 1921   Interim Hx: Afebrile.  Tachycardic with heart rate 115-156.  Blood pressure variable with systolics 76-102.  Saturation 94-97% on minimal nasal cannula support.  Resting comfortably in bed.  Reports that he feels much better overall today.  No new complaints.  Denies chest pain or shortness of breath.  Assessment & Plan:  Hypotension due to GIB and acute blood loss anemia Has responded with ongoing transfusion of PRBC and IV fluid resuscitation -continue to monitor blood pressure closely  GI bleeding On Eliquis at presentation which is of course on hold -was dosed with Kcentra at time of presentation -GI following with plan for EGD tomorrow  Acute blood loss anemia Due to above -hemoglobin appears to be stable presently  Recent Labs  Lab 08/17/23 2044 08/18/23 0311 08/18/23 1404 08/18/23 2105 08/19/23 0213  HGB 9.5* 9.2* 10.0* 9.1* 9.2*    Acute respiratory distress without hypoxia After transfusion developed tachypnea with respiratory rate up to 40 with crackles and wheezing on exam -improved with gentle diuresis -felt to be due to transient pulmonary edema  Elevated troponin Troponin checked twice  yesterday with both measurements around 2000 - no history of chest pain - worrisome for demand ischemia in setting of persisting hypotension related to blood loss - with new systolic CHF will likely benefit from cardiac eval once bleeding issues stabilized - clearly not safe for anticoag at this time -unable to use beta-blocker at present due to borderline hypotension  Newly diagnosed acute systolic congestive heart failure TTE 11/24 notes EF 30-35% with global hypokinesis and an RV that is mildly enlarged but with normal systolic function -this is new compared to last TTE obtained July 2023 -has a history of a stress-induced cardiomyopathy dating to the time of his prior CVA with subsequent recovery - followed by [facility] cardiology as an outpatient -not grossly volume overloaded on exam today  Mild to moderate mitral valve regurgitation and stenosis Noted on TTE 11/24  Moderate aortic valve stenosis Noted on TTE 11/24  AKI on CKD stage IIIa Due to hypovolemia/hypotension with possible component of ATN -followed by [facility] -baseline creatinine appears to be approximate 1.45  Recent Labs  Lab 08/17/23 1437 08/18/23 0311 08/19/23 0213  CREATININE 2.39* 2.46* 2.14*    PAD status post mesenteric artery bypass and bilateral iliac stenting 2021  Mild hypokalemia Supplement and follow  DM2 CBG well-controlled at present  RA P he is prescribed methotrexate and prednisone as an outpatient but history suggest he has been noncompliant  Gout Continue allopurinol  HLD  Family Communication: No family present at time of exam Disposition: Will depend upon findings of EGD   Objective: Blood pressure 102/68, pulse (!) 115, temperature 97.8 F (  36.6 C), temperature source Oral, resp. rate 20, height 5\' 8"  (1.727 m), weight 93.5 kg, SpO2 97%.  Intake/Output Summary (Last 24 hours) at 08/19/2023 0918 Last data filed at 08/19/2023 0820 Gross per 24 hour  Intake 240 ml   Output 1475 ml  Net -1235 ml   Filed Weights   08/17/23 1905 08/18/23 0320 08/19/23 0622  Weight: 91.2 kg 91.2 kg 93.5 kg    Examination: General: No acute respiratory distress Lungs: Clear to auscultation bilaterally without wheezes or crackles Cardiovascular: Regular rate and rhythm without murmur gallop or rub normal S1 and S2 Abdomen: Nontender, nondistended, soft, bowel sounds positive, no rebound, no ascites, no appreciable mass Extremities: No significant cyanosis, clubbing, or edema bilateral lower extremities  CBC: Recent Labs  Lab 08/17/23 1437 08/17/23 1939 08/18/23 1404 08/18/23 2105 08/19/23 0213  WBC 6.0  --   --   --   --   HGB 7.5*   < > 10.0* 9.1* 9.2*  HCT 23.3*   < > 29.5* 26.6* 26.4*  MCV 98.3  --   --   --   --   PLT 141*  --   --   --   --    < > = values in this interval not displayed.   Basic Metabolic Panel: Recent Labs  Lab 08/17/23 1437 08/17/23 1939 08/18/23 0311 08/19/23 0213  NA 136 137 136 138  K 4.0 4.2 3.9 3.4*  CL 103  --  105 106  CO2 23  --  20* 22  GLUCOSE 137*  --  139* 144*  BUN 70*  --  67* 67*  CREATININE 2.39*  --  2.46* 2.14*  CALCIUM 9.6  --  9.1 9.0  MG  --   --  1.6*  --   PHOS  --   --  5.1*  --    GFR: Estimated Creatinine Clearance: 31.5 mL/min (A) (by C-G formula based on SCr of 2.14 mg/dL (H)).   Scheduled Meds:  allopurinol  100 mg Oral Daily   folic acid  1 mg Oral Daily   hydrocortisone sod succinate (SOLU-CORTEF) inj  100 mg Intravenous Q8H   insulin aspart  0-15 Units Subcutaneous TID WC   pantoprazole (PROTONIX) IV  40 mg Intravenous Q12H     LOS: 2 days   Lonia Blood, MD [facility] Office  [phone] Pager - Text Page per Loretha Stapler  If 7PM-7AM, please contact night-coverage per Amion 08/19/2023, 9:18 AM

## 2023-08-19 NOTE — Progress Notes (Signed)
   08/19/23 1944  BiPAP/CPAP/SIPAP  BiPAP/CPAP/SIPAP Pt Type Adult  Reason BIPAP/CPAP not in use Non-compliant

## 2023-08-19 NOTE — Progress Notes (Signed)
Heart Failure Nurse Navigator Progress Note  PCP: Mechele Claude, MD PCP-Cardiologist: Branch Admission Diagnosis: ACute GI Bleed, Symptomatic anemia, Paroxysmal atrial fibrillation, AKI, Elevated troponin.  Admitted from: Home via EMS  Presentation:   Connor Bryant presented with dizziness and lightheadedness for 1 week, when EMS arrived patient was hypotensive 58/43, gave 1 Litter bolus pressure then 80/53, in the last month was started on Methotrexate for RA, Troponin 546, BNP 1228, Fecal Occult +, was given 2 units of PRBC's, for a Hgb of 7.5, EKG showed atrial fibrillation, potential plan for a EGD on 11/26,   Patient was educated on the sign and symptoms of heart failure, daily weights, when to call his doctor or go to the ED, Diet/ fluid restrictions, taking all medications as prescribed and attending all medical appointments. Patient verbalized his understanding of education, a HF TOC appointment was scheduled for 09/03/2023 @ 3 pm.   ECHO/ LVEF: 30-35%   Clinical Course:  Past Medical History:  Diagnosis Date   Acute CVA (cerebrovascular accident) (HCC) 05/01/2020   Acute respiratory failure with hypoxia (HCC) 05/04/2020   Arthritis    DM type 2 (diabetes mellitus, type 2) (HCC)    no meds   Endotracheally intubated    Gallstone    Heart murmur    was told one time he had a murmur, not since 30 years ago   HTN (hypertension)    Hyperlipidemia    Personal history of colonic polyp-adenoma 11/24/2013   Renal insufficiency    mild   Thoracic spine fracture (HCC)      Social History   Socioeconomic History   Marital status: Married    Spouse name: Not on file   Number of children: Not on file   Years of education: Not on file   Highest education level: Not on file  Occupational History   Occupation: Retired  Tobacco Use   Smoking status: Former    Current packs/day: 0.00    Types: Cigarettes    Quit date: 07/08/2009    Years since quitting: 14.1   Smokeless tobacco:  Never  Vaping Use   Vaping status: Never Used  Substance and Sexual Activity   Alcohol use: Not Currently    Comment: 8 beers a week   Drug use: Not Currently   Sexual activity: Yes  Other Topics Concern   Not on file  Social History Narrative   Patient is married, he is retired. 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   Social Determinants of Health   Financial Resource Strain: Low Risk  (08/19/2023)   Overall Financial Resource Strain (CARDIA)    Difficulty of Paying Living Expenses: Not very hard  Food Insecurity: No Food Insecurity (08/19/2023)   Hunger Vital Sign    Worried About Running Out of Food in the Last Year: Never true    Ran Out of Food in the Last Year: Never true  Transportation Needs: No Transportation Needs (08/19/2023)   PRAPARE - Administrator, Civil Service (Medical): No    Lack of Transportation (Non-Medical): No  Physical Activity: Insufficiently Active (12/03/2022)   Exercise Vital Sign    Days of Exercise per Week: 3 days    Minutes of Exercise per Session: 30 min  Stress: No Stress Concern Present (12/03/2022)   [employer] of Occupational Health - Occupational Stress Questionnaire    Feeling of Stress : Not at all  Social Connections: Moderately Isolated (12/03/2022)   Social Connection  and Isolation Panel [NHANES]    Frequency of Communication with Friends and Family: More than three times a week    Frequency of Social Gatherings with Friends and Family: More than three times a week    Attends Religious Services: Never    Database administrator or Organizations: No    Attends Engineer, structural: Never    Marital Status: Married   Water engineer and Provision:  Detailed education and instructions provided on heart failure disease management including the following:  Signs and symptoms of Heart Failure When to call the physician Importance of daily weights Low sodium diet Fluid  restriction Medication management Anticipated future follow-up appointments  Patient education given on each of the above topics.  Patient acknowledges understanding via teach back method and acceptance of all instructions.  Education Materials:  "Living Better With Heart Failure" Booklet, HF zone tool, & Daily Weight Tracker Tool.  Patient has scale at home: No, will buy one at discharge Patient has pill box at home: Yes    High Risk Criteria for Readmission and/or Poor Patient Outcomes: Heart failure hospital admissions (last 6 months): 0  No Show rate: 2% Difficult social situation: No, lives alone, wife recently passed away.  Demonstrates medication adherence: Yes Primary Language: English Literacy level: Reading, writing, and comprehension  Barriers of Care:   Diet/ fluid restrictions ( eats out a lot)  Daily weights Continued HF education  Considerations/Referrals:   Referral made to Heart Failure Pharmacist Stewardship: yes Referral made to Heart Failure CSW/NCM TOC: no Referral made to Heart & Vascular TOC clinic: Yes, 09/03/2023 @ 3 pm.   Items for Follow-up on DC/TOC: Diet/ fluid restrictions ( eats out lot)  Daily weights ( stated he would buy a scale at discharge)  Continued HF education ( new)    Rhae Hammock, BSN, Scientist, clinical (histocompatibility and immunogenetics) Only

## 2023-08-19 NOTE — Progress Notes (Signed)
Daily Progress Note  DOA: 08/17/2023 Hospital Day: 3  Chief Complaint: Anemia  ASSESSMENT    Brief Narrative:  Connor Bryant is a 77 y.o. year old male with a history of  RA on methotrexate and prednisone, A-fib on Eliquis, DM2, CVA, colon polyps.  Patient admitted 08/17/2023 with syncope, hypotension, acute respiratory failure, decompensated heart failure, AKI on CKD 3, anemia with heme + dark stools. Seen in consult by GI on 11/24.   Melena on Eliquis / acute on chronic anemia / syncope.  Hgb 7.5 on admit ( baseline 10.5). Recently started himself on oral iron for weakness.  Today: Hgb stable at 9.5 post 2uPRBCs.   Acute on chronic heart failure /  Acute respiratory failure.  EF 30-35% Today:  Just removed from  2 L 02  . No with 02 desaturation but not in any respiratory distress  Rheumatoid arthritis  On Steroids and recently started on methotrexate  AKi on CKD Cr down from 2.4 >> 2.1. GFR 31  Principal Problem:   GI bleed Active Problems:   GIB (gastrointestinal bleeding)   Hypotension   AKI (acute kidney injury) (HCC)   Respiratory distress   Acute on chronic anemia   Symptomatic anemia   On apixaban therapy   Paroxysmal atrial fibrillation (HCC)   PLAN   --Continue BID PPI --NPO after MN for possible EGD in am. The risks and benefits of EGD with possible biopsies were discussed with the patient who agrees to proceed.     Subjective   No complaints.   Objective   Echo 11/24 Left ventricular ejection fraction, by estimation, is 30 to 35%. The left ventricle has moderately decreased function. The left ventricle demonstrates global hypokinesis. Left ventricular diastolic function could not be evaluated. 1. Right ventricular systolic function is normal. The right ventricular size is mildly enlarged. There is moderately elevated pulmonary artery systolic pressure. The estimated right ventricular systolic pressure is 59.6 mmHg. 2. 3. Left atrial size was  mildly dilated. The mitral valve is abnormal. Mild to moderate mitral valve regurgitation. Mild mitral stenosis. The mean mitral valve gradient is 5.0 mmHg. Moderate to severe mitral annular calcification. 4. The aortic valve was not well visualized. There is severe calcifcation of the aortic valve. There is severe thickening of the aortic valve. Aortic valve regurgitation is not visualized. Moderate aortic valve stenosis. Aortic valve mean gradient measures 7.6 mmHg. Aortic valve Vmax measures 1.90 m/s. 5. The inferior vena cava is dilated in size with <50% respiratory variability, suggesting right atrial pressure of 15 mmHg.   Recent Labs    08/17/23 1437 08/17/23 1939 08/18/23 1404 08/18/23 2105 08/19/23 0213  WBC 6.0  --   --   --   --   HGB 7.5*   < > 10.0* 9.1* 9.2*  HCT 23.3*   < > 29.5* 26.6* 26.4*  PLT 141*  --   --   --   --    < > = values in this interval not displayed.   BMET Recent Labs    08/17/23 1437 08/17/23 1939 08/18/23 0311 08/19/23 0213  NA 136 137 136 138  K 4.0 4.2 3.9 3.4*  CL 103  --  105 106  CO2 23  --  20* 22  GLUCOSE 137*  --  139* 144*  BUN 70*  --  67* 67*  CREATININE 2.39*  --  2.46* 2.14*  CALCIUM 9.6  --  9.1 9.0   LFT Recent Labs  08/17/23 1437  PROT 6.2*  ALBUMIN 3.2*  AST 30  ALT 28  ALKPHOS 51  BILITOT 1.2*  BILIDIR 0.4*  IBILI 0.8   PT/INR Recent Labs    08/17/23 1437 08/18/23 0311  LABPROT 23.6* 20.1*  INR 2.1* 1.7*     Imaging:  ECHOCARDIOGRAM COMPLETE    ECHOCARDIOGRAM REPORT       Patient Name:   Connor Bryant Date of Exam: 08/18/2023 Medical Rec #:  [id number]      Height:       68.0 in Accession #:    [id number]     Weight:       201.1 lb Date of Birth:  04-28-1946      BSA:          2.048 m Patient Age:    41 years       BP:           101/69 mmHg Patient Gender: M              HR:           81 bpm. Exam Location:  Inpatient  Procedure: 2D Echo, Cardiac Doppler, Color Doppler and Intracardiac             Opacification Agent  Indications:    Dyspnea   History:        Patient has prior history of Echocardiogram examinations, most                 recent 04/05/2022. CAD, Arrythmias:Atrial Fibrillation,                 Signs/Symptoms:Hypotension; Risk Factors:Hypertension and                 Diabetes. NSTEMI.   Sonographer:    JLS Referring Phys: [id number] Clarene Critchley HOFFMAN  IMPRESSIONS   1. Left ventricular ejection fraction, by estimation, is 30 to 35%. The left ventricle has moderately decreased function. The left ventricle demonstrates global hypokinesis. Left ventricular diastolic function could not be evaluated.  2. Right ventricular systolic function is normal. The right ventricular size is mildly enlarged. There is moderately elevated pulmonary artery systolic pressure. The estimated right ventricular systolic pressure is 59.6 mmHg.  3. Left atrial size was mildly dilated.  4. The mitral valve is abnormal. Mild to moderate mitral valve regurgitation. Mild mitral stenosis. The mean mitral valve gradient is 5.0 mmHg. Moderate to severe mitral annular calcification.  5. The aortic valve was not well visualized. There is severe calcifcation of the aortic valve. There is severe thickening of the aortic valve. Aortic valve regurgitation is not visualized. Moderate aortic valve stenosis. Aortic valve mean gradient  measures 7.6 mmHg. Aortic valve Vmax measures 1.90 m/s.  6. The inferior vena cava is dilated in size with <50% respiratory variability, suggesting right atrial pressure of 15 mmHg.  FINDINGS  Left Ventricle: Left ventricular ejection fraction, by estimation, is 30 to 35%. The left ventricle has moderately decreased function. The left ventricle demonstrates global hypokinesis. The left ventricular internal cavity size was normal in size.  There is no left ventricular hypertrophy. Left ventricular diastolic function could not be evaluated due to atrial fibrillation. Left ventricular diastolic  function could not be evaluated.    LV Wall Scoring: The mid and distal anterior wall, entire apex, mid and distal inferior wall, mid anterolateral segment, and mid inferoseptal segment are akinetic.  Right Ventricle: The right ventricular size is mildly enlarged. No increase in right ventricular  wall thickness. Right ventricular systolic function is normal. There is moderately elevated pulmonary artery systolic pressure. The tricuspid regurgitant  velocity is 3.34 m/s, and with an assumed right atrial pressure of 15 mmHg, the estimated right ventricular systolic pressure is 59.6 mmHg.  Left Atrium: Left atrial size was mildly dilated.  Right Atrium: Right atrial size was normal in size.  Pericardium: There is no evidence of pericardial effusion.  Mitral Valve: The mitral valve is abnormal. Moderate to severe mitral annular calcification. Mild to moderate mitral valve regurgitation. Mild mitral valve stenosis. MV peak gradient, 9.2 mmHg. The mean mitral valve gradient is 5.0 mmHg.  Tricuspid Valve: The tricuspid valve is not well visualized. Tricuspid valve regurgitation is trivial. No evidence of tricuspid stenosis.  Aortic Valve: The aortic valve was not well visualized. There is severe calcifcation of the aortic valve. There is severe thickening of the aortic valve. Aortic valve regurgitation is not visualized. Moderate aortic stenosis is present. Aortic valve mean  gradient measures 7.6 mmHg. Aortic valve peak gradient measures 14.5 mmHg. Aortic valve area, by VTI measures 0.67 cm.  Pulmonic Valve: The pulmonic valve was not well visualized. Pulmonic valve regurgitation is not visualized. No evidence of pulmonic stenosis.  Aorta: The aortic root and ascending aorta are structurally normal, with no evidence of dilitation.  Venous: The inferior vena cava is dilated in size with less than 50% respiratory variability, suggesting right atrial pressure of 15 mmHg.  IAS/Shunts: No atrial  level shunt detected by color flow Doppler.    LEFT VENTRICLE PLAX 2D LVIDd:         5.20 cm LVIDs:         3.50 cm LV PW:         0.80 cm LV IVS:        0.80 cm LVOT diam:     1.70 cm LV SV:         23 LV SV Index:   11 LVOT Area:     2.27 cm   LV Volumes (MOD) LV vol d, MOD A2C: 125.0 ml LV vol d, MOD A4C: 178.0 ml LV vol s, MOD A2C: 71.9 ml LV vol s, MOD A4C: 103.0 ml LV SV MOD A2C:     53.1 ml LV SV MOD A4C:     178.0 ml LV SV MOD BP:      70.0 ml  RIGHT VENTRICLE            IVC RV Basal diam:  4.10 cm    IVC diam: 2.20 cm RV Mid diam:    3.10 cm RV S prime:     8.70 cm/s TAPSE (M-mode): 1.8 cm  LEFT ATRIUM             Index        RIGHT ATRIUM           Index LA Vol (A2C):   62.7 ml 30.61 ml/m  RA Area:     13.40 cm LA Vol (A4C):   82.4 ml 40.23 ml/m  RA Volume:   33.30 ml  16.26 ml/m LA Biplane Vol: 72.2 ml 35.25 ml/m  AORTIC VALVE                     PULMONIC VALVE AV Area (Vmax):    0.64 cm      PV Vmax:       0.68 m/s AV Area (Vmean):   0.66 cm      PV Peak  grad:  1.9 mmHg AV Area (VTI):     0.67 cm AV Vmax:           190.40 cm/s AV Vmean:          127.000 cm/s AV VTI:            0.339 m AV Peak Grad:      14.5 mmHg AV Mean Grad:      7.6 mmHg LVOT Vmax:         53.60 cm/s LVOT Vmean:        37.100 cm/s LVOT VTI:          0.100 m LVOT/AV VTI ratio: 0.29   AORTA Ao Root diam: 3.40 cm Ao Asc diam:  3.50 cm  MITRAL VALVE                  TRICUSPID VALVE MV Area (PHT): 4.19 cm       TR Peak grad:   44.6 mmHg MV Area VTI:   0.98 cm       TR Vmax:        334.00 cm/s MV Peak grad:  9.2 mmHg MV Mean grad:  5.0 mmHg       SHUNTS MV Vmax:       1.52 m/s       Systemic VTI:  0.10 m MV Vmean:      103.0 cm/s     Systemic Diam: 1.70 cm MV Decel Time: 181 msec MR Peak grad:    84.8 mmHg MR Mean grad:    55.3 mmHg MR Vmax:         460.33 cm/s MR Vmean:        352.7 cm/s MR PISA:         0.57 cm MR PISA Eff ROA: 10 mm MR PISA Radius:  0.30 cm MV  E velocity: 117.00 cm/s  Donato Schultz MD Electronically signed by Donato Schultz MD Signature Date/Time: 08/18/2023/2:31:12 PM      Final       Scheduled inpatient medications:   allopurinol  100 mg Oral Daily   folic acid  1 mg Oral Daily   hydrocortisone sod succinate (SOLU-CORTEF) inj  100 mg Intravenous Q8H   insulin aspart  0-15 Units Subcutaneous TID WC   pantoprazole (PROTONIX) IV  40 mg Intravenous Q12H   Continuous inpatient infusions:  PRN inpatient medications: docusate sodium, levalbuterol, polyethylene glycol  Vital signs in last 24 hours: Temp:  [97.5 F (36.4 C)-98 F (36.7 C)] 97.8 F (36.6 C) (11/25 0819) Pulse Rate:  [95-132] 115 (11/25 0819) Resp:  [13-20] 20 (11/25 0819) BP: (93-113)/(62-82) 102/68 (11/25 0819) SpO2:  [93 %-99 %] 97 % (11/25 0819) Weight:  [93.5 kg] 93.5 kg (11/25 0622) Last BM Date :  (PTA)  Intake/Output Summary (Last 24 hours) at 08/19/2023 1126 Last data filed at 08/19/2023 0820 Gross per 24 hour  Intake 240 ml  Output 1475 ml  Net -1235 ml    Intake/Output from previous day: 11/24 0701 - 11/25 0700 In: 240 [P.O.:240] Out: 675 [Urine:675] Intake/Output this shift: Total I/O In: -  Out: 800 [Urine:800]   Physical Exam:  General: Alert male in NAD Heart:  Regular rate and rhythm.  Pulmonary: A few bibasilar crackles.  Abdomen: Soft, nondistended, nontender. Normal bowel sounds. Neurologic: Alert and oriented Psych: Pleasant. Cooperative. Insight appears normal.      LOS: 2 days   Willette Cluster ,NP 08/19/2023, 11:26 AM

## 2023-08-19 NOTE — H&P (View-Only) (Signed)
Daily Progress Note  DOA: 08/17/2023 Hospital Day: 3  Chief Complaint: Anemia  ASSESSMENT    Brief Narrative:  Connor Bryant is a 77 y.o. year old male with a history of  RA on methotrexate and prednisone, A-fib on Eliquis, DM2, CVA, colon polyps.  Patient admitted 08/17/2023 with syncope, hypotension, acute respiratory failure, decompensated heart failure, AKI on CKD 3, anemia with heme + dark stools. Seen in consult by GI on 11/24.   Melena on Eliquis / acute on chronic anemia / syncope.  Hgb 7.5 on admit ( baseline 10.5). Recently started himself on oral iron for weakness.  Today: Hgb stable at 9.5 post 2uPRBCs.   Acute on chronic heart failure /  Acute respiratory failure.  EF 30-35% Today:  Just removed from  2 L 02  . No with 02 desaturation but not in any respiratory distress  Rheumatoid arthritis  On Steroids and recently started on methotrexate  AKi on CKD Cr down from 2.4 >> 2.1. GFR 31  Principal Problem:   GI bleed Active Problems:   GIB (gastrointestinal bleeding)   Hypotension   AKI (acute kidney injury) (HCC)   Respiratory distress   Acute on chronic anemia   Symptomatic anemia   On apixaban therapy   Paroxysmal atrial fibrillation (HCC)   PLAN   --Continue BID PPI --NPO after MN for possible EGD in am. The risks and benefits of EGD with possible biopsies were discussed with the patient who agrees to proceed.     Subjective   No complaints.   Objective   Echo 11/24 Left ventricular ejection fraction, by estimation, is 30 to 35%. The left ventricle has moderately decreased function. The left ventricle demonstrates global hypokinesis. Left ventricular diastolic function could not be evaluated. 1. Right ventricular systolic function is normal. The right ventricular size is mildly enlarged. There is moderately elevated pulmonary artery systolic pressure. The estimated right ventricular systolic pressure is 59.6 mmHg. 2. 3. Left atrial size was  mildly dilated. The mitral valve is abnormal. Mild to moderate mitral valve regurgitation. Mild mitral stenosis. The mean mitral valve gradient is 5.0 mmHg. Moderate to severe mitral annular calcification. 4. The aortic valve was not well visualized. There is severe calcifcation of the aortic valve. There is severe thickening of the aortic valve. Aortic valve regurgitation is not visualized. Moderate aortic valve stenosis. Aortic valve mean gradient measures 7.6 mmHg. Aortic valve Vmax measures 1.90 m/s. 5. The inferior vena cava is dilated in size with <50% respiratory variability, suggesting right atrial pressure of 15 mmHg.   Recent Labs    08/17/23 1437 08/17/23 1939 08/18/23 1404 08/18/23 2105 08/19/23 0213  WBC 6.0  --   --   --   --   HGB 7.5*   < > 10.0* 9.1* 9.2*  HCT 23.3*   < > 29.5* 26.6* 26.4*  PLT 141*  --   --   --   --    < > = values in this interval not displayed.   BMET Recent Labs    08/17/23 1437 08/17/23 1939 08/18/23 0311 08/19/23 0213  NA 136 137 136 138  K 4.0 4.2 3.9 3.4*  CL 103  --  105 106  CO2 23  --  20* 22  GLUCOSE 137*  --  139* 144*  BUN 70*  --  67* 67*  CREATININE 2.39*  --  2.46* 2.14*  CALCIUM 9.6  --  9.1 9.0   LFT Recent Labs  08/17/23 1437  PROT 6.2*  ALBUMIN 3.2*  AST 30  ALT 28  ALKPHOS 51  BILITOT 1.2*  BILIDIR 0.4*  IBILI 0.8   PT/INR Recent Labs    08/17/23 1437 08/18/23 0311  LABPROT 23.6* 20.1*  INR 2.1* 1.7*     Imaging:  ECHOCARDIOGRAM COMPLETE    ECHOCARDIOGRAM REPORT       Patient Name:   Connor Bryant Date of Exam: 08/18/2023 Medical Rec #:  [id number]      Height:       68.0 in Accession #:    [id number]     Weight:       201.1 lb Date of Birth:  05-16-1946      BSA:          2.048 m Patient Age:    70 years       BP:           101/69 mmHg Patient Gender: M              HR:           81 bpm. Exam Location:  Inpatient  Procedure: 2D Echo, Cardiac Doppler, Color Doppler and Intracardiac             Opacification Agent  Indications:    Dyspnea   History:        Patient has prior history of Echocardiogram examinations, most                 recent 04/05/2022. CAD, Arrythmias:Atrial Fibrillation,                 Signs/Symptoms:Hypotension; Risk Factors:Hypertension and                 Diabetes. NSTEMI.   Sonographer:    JLS Referring Phys: [id number] Clarene Critchley HOFFMAN  IMPRESSIONS   1. Left ventricular ejection fraction, by estimation, is 30 to 35%. The left ventricle has moderately decreased function. The left ventricle demonstrates global hypokinesis. Left ventricular diastolic function could not be evaluated.  2. Right ventricular systolic function is normal. The right ventricular size is mildly enlarged. There is moderately elevated pulmonary artery systolic pressure. The estimated right ventricular systolic pressure is 59.6 mmHg.  3. Left atrial size was mildly dilated.  4. The mitral valve is abnormal. Mild to moderate mitral valve regurgitation. Mild mitral stenosis. The mean mitral valve gradient is 5.0 mmHg. Moderate to severe mitral annular calcification.  5. The aortic valve was not well visualized. There is severe calcifcation of the aortic valve. There is severe thickening of the aortic valve. Aortic valve regurgitation is not visualized. Moderate aortic valve stenosis. Aortic valve mean gradient  measures 7.6 mmHg. Aortic valve Vmax measures 1.90 m/s.  6. The inferior vena cava is dilated in size with <50% respiratory variability, suggesting right atrial pressure of 15 mmHg.  FINDINGS  Left Ventricle: Left ventricular ejection fraction, by estimation, is 30 to 35%. The left ventricle has moderately decreased function. The left ventricle demonstrates global hypokinesis. The left ventricular internal cavity size was normal in size.  There is no left ventricular hypertrophy. Left ventricular diastolic function could not be evaluated due to atrial fibrillation. Left ventricular diastolic  function could not be evaluated.    LV Wall Scoring: The mid and distal anterior wall, entire apex, mid and distal inferior wall, mid anterolateral segment, and mid inferoseptal segment are akinetic.  Right Ventricle: The right ventricular size is mildly enlarged. No increase in right ventricular  wall thickness. Right ventricular systolic function is normal. There is moderately elevated pulmonary artery systolic pressure. The tricuspid regurgitant  velocity is 3.34 m/s, and with an assumed right atrial pressure of 15 mmHg, the estimated right ventricular systolic pressure is 59.6 mmHg.  Left Atrium: Left atrial size was mildly dilated.  Right Atrium: Right atrial size was normal in size.  Pericardium: There is no evidence of pericardial effusion.  Mitral Valve: The mitral valve is abnormal. Moderate to severe mitral annular calcification. Mild to moderate mitral valve regurgitation. Mild mitral valve stenosis. MV peak gradient, 9.2 mmHg. The mean mitral valve gradient is 5.0 mmHg.  Tricuspid Valve: The tricuspid valve is not well visualized. Tricuspid valve regurgitation is trivial. No evidence of tricuspid stenosis.  Aortic Valve: The aortic valve was not well visualized. There is severe calcifcation of the aortic valve. There is severe thickening of the aortic valve. Aortic valve regurgitation is not visualized. Moderate aortic stenosis is present. Aortic valve mean  gradient measures 7.6 mmHg. Aortic valve peak gradient measures 14.5 mmHg. Aortic valve area, by VTI measures 0.67 cm.  Pulmonic Valve: The pulmonic valve was not well visualized. Pulmonic valve regurgitation is not visualized. No evidence of pulmonic stenosis.  Aorta: The aortic root and ascending aorta are structurally normal, with no evidence of dilitation.  Venous: The inferior vena cava is dilated in size with less than 50% respiratory variability, suggesting right atrial pressure of 15 mmHg.  IAS/Shunts: No atrial  level shunt detected by color flow Doppler.    LEFT VENTRICLE PLAX 2D LVIDd:         5.20 cm LVIDs:         3.50 cm LV PW:         0.80 cm LV IVS:        0.80 cm LVOT diam:     1.70 cm LV SV:         23 LV SV Index:   11 LVOT Area:     2.27 cm   LV Volumes (MOD) LV vol d, MOD A2C: 125.0 ml LV vol d, MOD A4C: 178.0 ml LV vol s, MOD A2C: 71.9 ml LV vol s, MOD A4C: 103.0 ml LV SV MOD A2C:     53.1 ml LV SV MOD A4C:     178.0 ml LV SV MOD BP:      70.0 ml  RIGHT VENTRICLE            IVC RV Basal diam:  4.10 cm    IVC diam: 2.20 cm RV Mid diam:    3.10 cm RV S prime:     8.70 cm/s TAPSE (M-mode): 1.8 cm  LEFT ATRIUM             Index        RIGHT ATRIUM           Index LA Vol (A2C):   62.7 ml 30.61 ml/m  RA Area:     13.40 cm LA Vol (A4C):   82.4 ml 40.23 ml/m  RA Volume:   33.30 ml  16.26 ml/m LA Biplane Vol: 72.2 ml 35.25 ml/m  AORTIC VALVE                     PULMONIC VALVE AV Area (Vmax):    0.64 cm      PV Vmax:       0.68 m/s AV Area (Vmean):   0.66 cm      PV Peak  grad:  1.9 mmHg AV Area (VTI):     0.67 cm AV Vmax:           190.40 cm/s AV Vmean:          127.000 cm/s AV VTI:            0.339 m AV Peak Grad:      14.5 mmHg AV Mean Grad:      7.6 mmHg LVOT Vmax:         53.60 cm/s LVOT Vmean:        37.100 cm/s LVOT VTI:          0.100 m LVOT/AV VTI ratio: 0.29   AORTA Ao Root diam: 3.40 cm Ao Asc diam:  3.50 cm  MITRAL VALVE                  TRICUSPID VALVE MV Area (PHT): 4.19 cm       TR Peak grad:   44.6 mmHg MV Area VTI:   0.98 cm       TR Vmax:        334.00 cm/s MV Peak grad:  9.2 mmHg MV Mean grad:  5.0 mmHg       SHUNTS MV Vmax:       1.52 m/s       Systemic VTI:  0.10 m MV Vmean:      103.0 cm/s     Systemic Diam: 1.70 cm MV Decel Time: 181 msec MR Peak grad:    84.8 mmHg MR Mean grad:    55.3 mmHg MR Vmax:         460.33 cm/s MR Vmean:        352.7 cm/s MR PISA:         0.57 cm MR PISA Eff ROA: 10 mm MR PISA Radius:  0.30 cm MV  E velocity: 117.00 cm/s  Donato Schultz MD Electronically signed by Donato Schultz MD Signature Date/Time: 08/18/2023/2:31:12 PM      Final       Scheduled inpatient medications:   allopurinol  100 mg Oral Daily   folic acid  1 mg Oral Daily   hydrocortisone sod succinate (SOLU-CORTEF) inj  100 mg Intravenous Q8H   insulin aspart  0-15 Units Subcutaneous TID WC   pantoprazole (PROTONIX) IV  40 mg Intravenous Q12H   Continuous inpatient infusions:  PRN inpatient medications: docusate sodium, levalbuterol, polyethylene glycol  Vital signs in last 24 hours: Temp:  [97.5 F (36.4 C)-98 F (36.7 C)] 97.8 F (36.6 C) (11/25 0819) Pulse Rate:  [95-132] 115 (11/25 0819) Resp:  [13-20] 20 (11/25 0819) BP: (93-113)/(62-82) 102/68 (11/25 0819) SpO2:  [93 %-99 %] 97 % (11/25 0819) Weight:  [93.5 kg] 93.5 kg (11/25 0622) Last BM Date :  (PTA)  Intake/Output Summary (Last 24 hours) at 08/19/2023 1126 Last data filed at 08/19/2023 0820 Gross per 24 hour  Intake 240 ml  Output 1475 ml  Net -1235 ml    Intake/Output from previous day: 11/24 0701 - 11/25 0700 In: 240 [P.O.:240] Out: 675 [Urine:675] Intake/Output this shift: Total I/O In: -  Out: 800 [Urine:800]   Physical Exam:  General: Alert male in NAD Heart:  Regular rate and rhythm.  Pulmonary: A few bibasilar crackles.  Abdomen: Soft, nondistended, nontender. Normal bowel sounds. Neurologic: Alert and oriented Psych: Pleasant. Cooperative. Insight appears normal.      LOS: 2 days   Willette Cluster ,NP 08/19/2023, 11:26 AM

## 2023-08-19 NOTE — Progress Notes (Signed)
   08/18/23 2147  BiPAP/CPAP/SIPAP  Reason BIPAP/CPAP not in use  (BIPAP PRN. no resp distress noted at htis time. pt currently resting on 2l [place])

## 2023-08-19 NOTE — Progress Notes (Signed)
   Heart Failure Stewardship Pharmacist Progress Note   PCP: Mechele Claude, MD PCP-Cardiologist: Dina Rich, MD    HPI:  77 yo M with PMH of CHF, PVD, RA, T2DM, HTN, afib, PE, CAD, and CVA.  History of HFrEF with EF 35% in 04/2020 during admission for NSTEMI. Discharged on lasix, carvedilol, and hydralazine/imdur. Improved to 60-65% in 06/2020.  Presented to the ED on 11/23 with dizziness, lightheadedness, and hypotensive. Has been ongoing for 1 week. BP 58/43 on arrival. Given 1L fluid and improved to 80/53. Hgb 7.5 and FOBT positive. Admitted for hemorrhagic shock with acute GI bleed. S/p 2 units PRBC and Kcentra (on PTA Eliquis) and Hgb has been stable. Tentative plan for EGD later this admission - planning for 11/26.   CXR with cardiomegaly and diffuse bilateral interstitial pulmonary opacity, consistent with edema vs infection. ECHO 11/24 with LVEF 30-35%, global hypokinesis, RV normal, mild to moderate MR.   Denies shortness of breath and LE edema. Still on 1L O2 - not on any supplemental O2 at home.  Current HF Medications: None  Prior to admission HF Medications: Beta blocker: carvedilol 25 mg BID ACE/ARB/ARNI: lisinopril 20 mg daily Other: hydralazine 100 mg BID + Imdur 30 mg daily  Pertinent Lab Values: Serum creatinine 2.14, BUN 67, Potassium 3.4, Sodium 138, BNP 1228.1, Magnesium 1.6, A1c 6.8   Vital Signs: Weight: 206 lbs (admission weight: 201 lbs) Blood pressure: 80/50-100/60s  Heart rate: 100-110s  I/O: incomplete  Medication Assistance / Insurance Benefits Check: Does the patient have prescription insurance?  Yes Type of insurance plan: [facility] Medicare  Outpatient Pharmacy:  Prior to admission outpatient pharmacy: [facility] Is the patient willing to use [facility] TOC pharmacy at discharge? Yes Is the patient willing to transition their outpatient pharmacy to utilize a [facility] outpatient pharmacy?   No    Assessment: 1. Acute on chronic systolic  CHF (LVEF 30-35%). NYHA class II symptoms. - GDMT limited with hypotension - Planning EGD 11/26, may be able to start SGLT2i afterward   Plan: 1) Medication changes recommended at this time: - None pending improvement in BP and EGD on 11/26  2) Patient assistance: - Currently in the coverage gap / donut hole - Jardiance copay $153 in donut hole - Farxiga copay $146 in donut hole  3)  Education  - Initial education completed - Full education to be completed prior to discharge  Sharen Hones, PharmD, BCPS Heart Failure Engineer, building services Phone [phone]

## 2023-08-20 ENCOUNTER — Inpatient Hospital Stay (HOSPITAL_COMMUNITY): Payer: Medicare Other

## 2023-08-20 ENCOUNTER — Inpatient Hospital Stay (HOSPITAL_COMMUNITY): Payer: Medicare Other | Admitting: Certified Registered Nurse Anesthetist

## 2023-08-20 ENCOUNTER — Encounter (HOSPITAL_COMMUNITY): Payer: Self-pay | Admitting: Pulmonary Disease

## 2023-08-20 ENCOUNTER — Telehealth: Payer: Self-pay

## 2023-08-20 ENCOUNTER — Encounter (HOSPITAL_COMMUNITY)
Admission: EM | Disposition: A | Discharge status: Home Health | Payer: Self-pay | Source: Home / Self Care | Attending: Pulmonary Disease

## 2023-08-20 DIAGNOSIS — D509 Iron deficiency anemia, unspecified: Secondary | ICD-10-CM

## 2023-08-20 DIAGNOSIS — K3189 Other diseases of stomach and duodenum: Secondary | ICD-10-CM | POA: Diagnosis not present

## 2023-08-20 DIAGNOSIS — K2289 Other specified disease of esophagus: Secondary | ICD-10-CM | POA: Diagnosis not present

## 2023-08-20 DIAGNOSIS — I9589 Other hypotension: Secondary | ICD-10-CM | POA: Diagnosis not present

## 2023-08-20 DIAGNOSIS — K2950 Unspecified chronic gastritis without bleeding: Secondary | ICD-10-CM | POA: Diagnosis not present

## 2023-08-20 DIAGNOSIS — K2970 Gastritis, unspecified, without bleeding: Secondary | ICD-10-CM

## 2023-08-20 DIAGNOSIS — J9601 Acute respiratory failure with hypoxia: Secondary | ICD-10-CM | POA: Diagnosis not present

## 2023-08-20 DIAGNOSIS — K922 Gastrointestinal hemorrhage, unspecified: Secondary | ICD-10-CM | POA: Diagnosis not present

## 2023-08-20 HISTORY — PX: BIOPSY: SHX5522

## 2023-08-20 HISTORY — PX: ESOPHAGOGASTRODUODENOSCOPY (EGD) WITH PROPOFOL: SHX5813

## 2023-08-20 LAB — GLUCOSE, CAPILLARY
Glucose-Capillary: 107 mg/dL — ABNORMAL HIGH (ref 70–99)
Glucose-Capillary: 107 mg/dL — ABNORMAL HIGH (ref 70–99)
Glucose-Capillary: 168 mg/dL — ABNORMAL HIGH (ref 70–99)
Glucose-Capillary: 170 mg/dL — ABNORMAL HIGH (ref 70–99)
Glucose-Capillary: 246 mg/dL — ABNORMAL HIGH (ref 70–99)
Glucose-Capillary: 307 mg/dL — ABNORMAL HIGH (ref 70–99)
Glucose-Capillary: 90 mg/dL (ref 70–99)

## 2023-08-20 LAB — COMPREHENSIVE METABOLIC PANEL
ALT: 44 U/L (ref 0–44)
AST: 28 U/L (ref 15–41)
Albumin: 2.8 g/dL — ABNORMAL LOW (ref 3.5–5.0)
Alkaline Phosphatase: 43 U/L (ref 38–126)
Anion gap: 12 (ref 5–15)
BUN: 59 mg/dL — ABNORMAL HIGH (ref 8–23)
CO2: 21 mmol/L — ABNORMAL LOW (ref 22–32)
Calcium: 8.8 mg/dL — ABNORMAL LOW (ref 8.9–10.3)
Chloride: 104 mmol/L (ref 98–111)
Creatinine, Ser: 1.88 mg/dL — ABNORMAL HIGH (ref 0.61–1.24)
GFR, Estimated: 36 mL/min — ABNORMAL LOW (ref >60)
Glucose, Bld: 67 mg/dL — ABNORMAL LOW (ref 70–99)
Potassium: 2.6 mmol/L — CL (ref 3.5–5.1)
Sodium: 137 mmol/L (ref 135–145)
Total Bilirubin: 1.6 mg/dL — ABNORMAL HIGH (ref <1.2)
Total Protein: 5.6 g/dL — ABNORMAL LOW (ref 6.5–8.1)

## 2023-08-20 LAB — FERRITIN: Ferritin: 369 ng/mL — ABNORMAL HIGH (ref 24–336)

## 2023-08-20 LAB — TROPONIN I (HIGH SENSITIVITY)
Troponin I (High Sensitivity): 707 ng/L (ref <18)
Troponin I (High Sensitivity): 624 ng/L (ref <18)

## 2023-08-20 LAB — IRON AND TIBC
Iron: 40 ug/dL — ABNORMAL LOW (ref 45–182)
Saturation Ratios: 16 % — ABNORMAL LOW (ref 17.9–39.5)
TIBC: 246 ug/dL — ABNORMAL LOW (ref 250–450)
UIBC: 206 ug/dL

## 2023-08-20 LAB — BASIC METABOLIC PANEL
Anion gap: 11 (ref 5–15)
BUN: 58 mg/dL — ABNORMAL HIGH (ref 8–23)
CO2: 22 mmol/L (ref 22–32)
Calcium: 8.8 mg/dL — ABNORMAL LOW (ref 8.9–10.3)
Chloride: 104 mmol/L (ref 98–111)
Creatinine, Ser: 1.86 mg/dL — ABNORMAL HIGH (ref 0.61–1.24)
GFR, Estimated: 37 mL/min — ABNORMAL LOW (ref >60)
Glucose, Bld: 189 mg/dL — ABNORMAL HIGH (ref 70–99)
Potassium: 3.9 mmol/L (ref 3.5–5.1)
Sodium: 137 mmol/L (ref 135–145)

## 2023-08-20 LAB — ECHOCARDIOGRAM LIMITED
Height: 68 in
Weight: 3185.21 [oz_av]

## 2023-08-20 LAB — CBC
HCT: 27.1 % — ABNORMAL LOW (ref 39.0–52.0)
Hemoglobin: 9.3 g/dL — ABNORMAL LOW (ref 13.0–17.0)
MCH: 31.5 pg (ref 26.0–34.0)
MCHC: 34.3 g/dL (ref 30.0–36.0)
MCV: 91.9 fL (ref 80.0–100.0)
Platelets: 159 10*3/uL (ref 150–400)
RBC: 2.95 MIL/uL — ABNORMAL LOW (ref 4.22–5.81)
RDW: 17.5 % — ABNORMAL HIGH (ref 11.5–15.5)
WBC: 5.5 10*3/uL (ref 4.0–10.5)
nRBC: 0 % (ref 0.0–0.2)

## 2023-08-20 LAB — HEMOGLOBIN A1C
Hgb A1c MFr Bld: 6.6 % — ABNORMAL HIGH (ref 4.8–5.6)
Mean Plasma Glucose: 142.72 mg/dL

## 2023-08-20 LAB — POTASSIUM: Potassium: 3.2 mmol/L — ABNORMAL LOW (ref 3.5–5.1)

## 2023-08-20 LAB — MAGNESIUM: Magnesium: 1.6 mg/dL — ABNORMAL LOW (ref 1.7–2.4)

## 2023-08-20 LAB — PHOSPHORUS: Phosphorus: 3.2 mg/dL (ref 2.5–4.6)

## 2023-08-20 SURGERY — ESOPHAGOGASTRODUODENOSCOPY (EGD) WITH PROPOFOL
Anesthesia: Monitor Anesthesia Care

## 2023-08-20 MED ORDER — FUROSEMIDE 10 MG/ML IJ SOLN
INTRAMUSCULAR | Status: AC
  Filled 2023-08-20: qty 4

## 2023-08-20 MED ORDER — REVEFENACIN 175 MCG/3ML IN SOLN
175.0000 ug | Freq: Every day | RESPIRATORY_TRACT | Status: DC
  Filled 2023-08-20: qty 3

## 2023-08-20 MED ORDER — DIPHENHYDRAMINE HCL 50 MG/ML IJ SOLN
12.5000 mg | Freq: Once | INTRAMUSCULAR | Status: AC
  Administered 2023-08-20: 12.5 mg via INTRAVENOUS

## 2023-08-20 MED ORDER — FUROSEMIDE 10 MG/ML IJ SOLN
20.0000 mg | Freq: Once | INTRAMUSCULAR | Status: AC
  Administered 2023-08-20: 20 mg via INTRAVENOUS

## 2023-08-20 MED ORDER — AMIODARONE HCL IN DEXTROSE 360-4.14 MG/200ML-% IV SOLN
60.0000 mg/h | INTRAVENOUS | Status: AC
  Administered 2023-08-20 (×2): 60 mg/h via INTRAVENOUS
  Filled 2023-08-20 (×2): qty 200

## 2023-08-20 MED ORDER — DEXTROSE 50 % IV SOLN: 12.5000 g | INTRAVENOUS | Status: DC | PRN

## 2023-08-20 MED ORDER — AMIODARONE HCL IN DEXTROSE 360-4.14 MG/200ML-% IV SOLN
30.0000 mg/h | INTRAVENOUS | Status: DC
  Administered 2023-08-20 – 2023-08-21 (×2): 30 mg/h via INTRAVENOUS
  Filled 2023-08-20: qty 200

## 2023-08-20 MED ORDER — METHYLPREDNISOLONE SODIUM SUCC 125 MG IJ SOLR
40.0000 mg | Freq: Every day | INTRAMUSCULAR | Status: DC
  Administered 2023-08-21: 40 mg via INTRAVENOUS
  Filled 2023-08-20: qty 2

## 2023-08-20 MED ORDER — METHYLPREDNISOLONE SODIUM SUCC 125 MG IJ SOLR
80.0000 mg | Freq: Once | INTRAMUSCULAR | Status: AC
  Administered 2023-08-20: 80 mg via INTRAVENOUS
  Filled 2023-08-20: qty 2

## 2023-08-20 MED ORDER — FUROSEMIDE 10 MG/ML IJ SOLN
40.0000 mg | Freq: Once | INTRAMUSCULAR | Status: AC
  Administered 2023-08-20: 40 mg via INTRAVENOUS
  Filled 2023-08-20: qty 4

## 2023-08-20 MED ORDER — IPRATROPIUM-ALBUTEROL 0.5-2.5 (3) MG/3ML IN SOLN
3.0000 mL | Freq: Once | RESPIRATORY_TRACT | Status: AC
  Administered 2023-08-20: 3 mL via RESPIRATORY_TRACT

## 2023-08-20 MED ORDER — CHLORHEXIDINE GLUCONATE CLOTH 2 % EX PADS
6.0000 | MEDICATED_PAD | Freq: Every day | CUTANEOUS | Status: DC
  Administered 2023-08-20: 6 via TOPICAL

## 2023-08-20 MED ORDER — DIPHENHYDRAMINE HCL 50 MG/ML IJ SOLN
INTRAMUSCULAR | Status: AC
  Filled 2023-08-20: qty 1

## 2023-08-20 MED ORDER — POTASSIUM CHLORIDE 10 MEQ/100ML IV SOLN
10.0000 meq | INTRAVENOUS | Status: AC
  Administered 2023-08-20 (×4): 10 meq via INTRAVENOUS
  Filled 2023-08-20 (×2): qty 100

## 2023-08-20 MED ORDER — ARFORMOTEROL TARTRATE 15 MCG/2ML IN NEBU
15.0000 ug | INHALATION_SOLUTION | Freq: Two times a day (BID) | RESPIRATORY_TRACT | Status: DC
  Administered 2023-08-20: 15 ug via RESPIRATORY_TRACT
  Filled 2023-08-20 (×2): qty 2

## 2023-08-20 MED ORDER — PHENYLEPHRINE 80 MCG/ML (10ML) SYRINGE FOR IV PUSH (FOR BLOOD PRESSURE SUPPORT)
PREFILLED_SYRINGE | INTRAVENOUS | Status: DC | PRN
  Administered 2023-08-20: 240 ug via INTRAVENOUS
  Administered 2023-08-20 (×2): 160 ug via INTRAVENOUS
  Administered 2023-08-20: 120 ug via INTRAVENOUS
  Administered 2023-08-20: 160 ug via INTRAVENOUS
  Administered 2023-08-20: 120 ug via INTRAVENOUS

## 2023-08-20 MED ORDER — PANTOPRAZOLE SODIUM 40 MG IV SOLR
40.0000 mg | Freq: Two times a day (BID) | INTRAVENOUS | Status: DC
Start: 2023-08-20 — End: 2023-08-21
  Administered 2023-08-20: 40 mg via INTRAVENOUS
  Filled 2023-08-20 (×2): qty 10

## 2023-08-20 MED ORDER — AMIODARONE LOAD VIA INFUSION
150.0000 mg | Freq: Once | INTRAVENOUS | Status: AC
  Administered 2023-08-20: 150 mg via INTRAVENOUS
  Filled 2023-08-20: qty 83.34

## 2023-08-20 MED ORDER — SODIUM CHLORIDE 0.9 % IV BOLUS
250.0000 mL | Freq: Once | INTRAVENOUS | Status: AC
  Administered 2023-08-20: 250 mL via INTRAVENOUS

## 2023-08-20 MED ORDER — DEXTROSE 50 % IV SOLN
12.5000 g | INTRAVENOUS | Status: DC
  Filled 2023-08-20: qty 50

## 2023-08-20 MED ORDER — MAGNESIUM SULFATE 2 GM/50ML IV SOLN
2.0000 g | Freq: Once | INTRAVENOUS | Status: AC
  Administered 2023-08-20: 2 g via INTRAVENOUS
  Filled 2023-08-20: qty 50

## 2023-08-20 MED ORDER — IPRATROPIUM-ALBUTEROL 0.5-2.5 (3) MG/3ML IN SOLN
RESPIRATORY_TRACT | Status: AC
  Filled 2023-08-20: qty 3

## 2023-08-20 MED ORDER — POTASSIUM CHLORIDE CRYS ER 20 MEQ PO TBCR: 40.0000 meq | EXTENDED_RELEASE_TABLET | ORAL | Status: DC

## 2023-08-20 MED ORDER — PROPOFOL 10 MG/ML IV BOLUS
INTRAVENOUS | Status: DC | PRN
  Administered 2023-08-20: 20 mg via INTRAVENOUS
  Administered 2023-08-20: 40 mg via INTRAVENOUS

## 2023-08-20 MED ORDER — ALBUMIN HUMAN 5 % IV SOLN: INTRAVENOUS | Status: DC | PRN

## 2023-08-20 MED ORDER — HYALURONIDASE HUMAN 150 UNIT/ML IJ SOLN
150.0000 [IU] | Freq: Once | INTRAMUSCULAR | Status: AC
  Administered 2023-08-20: 150 [IU] via SUBCUTANEOUS
  Filled 2023-08-20: qty 1

## 2023-08-20 MED ORDER — HYALURONIDASE OVINE 200 UNIT/ML IJ SOLN: 50.0000 [IU] | Freq: Once | INTRAMUSCULAR | Status: DC

## 2023-08-20 MED ORDER — POTASSIUM CHLORIDE 10 MEQ/100ML IV SOLN
10.0000 meq | INTRAVENOUS | Status: AC
  Administered 2023-08-20 (×4): 10 meq via INTRAVENOUS
  Filled 2023-08-20 (×4): qty 100

## 2023-08-20 MED ORDER — SODIUM CHLORIDE 0.9 % IV SOLN: INTRAVENOUS | Status: DC | PRN

## 2023-08-20 MED ORDER — PERFLUTREN LIPID MICROSPHERE
1.0000 mL | INTRAVENOUS | Status: AC | PRN
  Administered 2023-08-20: 3 mL via INTRAVENOUS

## 2023-08-20 MED ORDER — PROPOFOL 500 MG/50ML IV EMUL
INTRAVENOUS | Status: DC | PRN
  Administered 2023-08-20: 120 ug/kg/min via INTRAVENOUS

## 2023-08-20 MED ORDER — INSULIN ASPART 100 UNIT/ML IJ SOLN
0.0000 [IU] | INTRAMUSCULAR | Status: DC
  Administered 2023-08-20: 2 [IU] via SUBCUTANEOUS
  Administered 2023-08-20: 3 [IU] via SUBCUTANEOUS
  Administered 2023-08-21: 2 [IU] via SUBCUTANEOUS
  Administered 2023-08-21 (×2): 3 [IU] via SUBCUTANEOUS
  Administered 2023-08-21: 5 [IU] via SUBCUTANEOUS
  Administered 2023-08-21: 7 [IU] via SUBCUTANEOUS

## 2023-08-20 MED ORDER — SODIUM CHLORIDE 0.9 % IV BOLUS: 500.0000 mL | Freq: Once | INTRAVENOUS | Status: DC

## 2023-08-20 MED ORDER — LIDOCAINE HCL (CARDIAC) PF 100 MG/5ML IV SOSY
PREFILLED_SYRINGE | INTRAVENOUS | Status: DC | PRN
  Administered 2023-08-20: 80 mg via INTRAVENOUS

## 2023-08-20 SURGICAL SUPPLY — 14 items

## 2023-08-20 NOTE — Progress Notes (Addendum)
Connor Bryant  WUJ:[id number] DOB: 09-13-1946 DOA: 08/17/2023 PCP: Mechele Claude, MD    Brief Narrative:  77 year old with a history of RA on methotrexate and prednisone, chronic atrial fibrillation on Eliquis, remote PE, DM2, HLD, HTN, and prior CVA who presented to [facility] ER 11/23 with generalized weakness and lightheadedness that had been progressively worsening over a 2 week timeframe.  EMS discovered a systolic blood pressure of 58.  He did report multiple weeks of dark stools.  Initial hemoglobin was found to be 7.5 with a BUN of 70 and creatinine of 2.4.  Hypotension persisted despite volume resuscitation and blood transfusion and therefore he was transferred to the [facility] ICU.  Goals of Care:   Code Status: Full Code   DVT prophylaxis: SCDs Start: 08/17/23 1921   Interim Hx: No acute events recorded overnight.  Afebrile.  Some modest tachycardia with heart rates 103-125 with systolic blood pressure 84-111 prior to his endoscopy.  Following his endoscopy, while still in the recovery area, the patient began to experience diffuse itching and shortness of breath.  I was not alerted but critical care was consulted by the Endo staff and presented to the unit to care for the patient.  He was placed on BiPAP and moved to the PACU.  I went to see him in the PACU where he is tolerating BiPAP well.  Saturations are 94% on BiPAP with some persisting tachycardia with heart rates up to 120.  He denies any chest pain whatsoever.  He is not itching at present.  There is no stridor or wheezing.  There is no swelling of the face.  He is alert and conversant and tells me that if the option is to die or be placed on the ventilator he would choose to be intubated.  Assessment & Plan:  Postprocedural dyspnea Being moved to the ICU under the care of PCCM -exact etiology not clear but reportedly the patient was wheezing significantly initially and complaining of diffuse itching -timing was coincident with  recent albumin dosing -denies chest pain -workup ongoing and being directed by PCCM  Hypotension due to GIB and acute blood loss anemia Has responded with ongoing transfusion of PRBC and IV fluid resuscitation -continue to monitor blood pressure closely  GI bleeding On Eliquis at presentation - was dosed with Kcentra at time of presentation - EGD today noted linear gastric erosions that were likely the source of his melena and anemia - plan is for PPI BID for 8 weeks, then every day - avoid NSAIDs - OK to restart Eliquis 11/27 from GI standpoint   Acute blood loss anemia Due to above -hemoglobin appears to be stable presently  Recent Labs  Lab 08/18/23 0311 08/18/23 1404 08/18/23 2105 08/19/23 0213 08/20/23 0431  HGB 9.2* 10.0* 9.1* 9.2* 9.3*    Acute respiratory distress without hypoxia After transfusion earlier in the hospital stay he developed tachypnea with respiratory rate up to 40 with crackles and wheezing on exam - improved with gentle diuresis - felt to be due to transient pulmonary edema - perhaps today's event was a repeat similar event related to albumin?  Elevated troponin - probable NSTEMI Troponin checked twice 11/24 with both measurements around 2000, likely representing peaking from those initially checked on 11/23 at which time they were ~500 - no history of chest pain - worrisome for demand ischemia in setting of persisting hypotension related to blood loss - with new systolic CHF will benefit from cardiac eval once bleeding issues  stabilized - unable to use beta-blocker at present due to borderline hypotension - unable to anticoag prior to EGD due to concern for bleeding - had what sounds like a nearly identical episode at the time of a CVA in 2021 with apical LV akinesis that resolved in time - does not appear to have every undergone coronary eval   Newly diagnosed acute systolic congestive heart failure TTE 11/24 notes EF 30-35% with global hypokinesis and an RV that  is mildly enlarged but with normal systolic function -this is new compared to last TTE obtained July 2023 -has a history of a stress-induced cardiomyopathy dating to the time of his prior CVA with subsequent recovery - followed by [facility] cardiology as an outpatient -not grossly volume overloaded on exam  Chronic atrial fibrillation on Eliquis with acute RVR Heart rate has been moderately well-controlled but in setting of his acute distress today he has developed significant tachycardia with heart rate sustained in the 130-140 range -given persisting hypotension we cannot use CCB or BB - I will initiate an amiodarone drip for now  Mild to moderate mitral valve regurgitation and stenosis Noted on TTE 11/24  Moderate aortic valve stenosis Noted on TTE 11/24  AKI on CKD stage IIIa Due to hypovolemia/hypotension with possible component of ATN -followed by [facility] -baseline creatinine appears to be approximate 1.45 -creatinine steadily improving at this time  Recent Labs  Lab 08/17/23 1437 08/18/23 0311 08/19/23 0213 08/20/23 0431  CREATININE 2.39* 2.46* 2.14* 1.88*    PAD status post mesenteric artery bypass and bilateral iliac stenting 2021  Hypokalemia Continue to supplement and follow  Hypomagnesemia Supplement and follow  DM2 CBG well-controlled at present  RA he is prescribed methotrexate and prednisone as an outpatient but history suggest he has been noncompliant  Gout Continue allopurinol  HLD  Family Communication: No family present at time of exam in PACU Disposition: Will depend upon findings of EGD and improvement while back in ICU   Objective: Blood pressure 101/72, pulse (!) 125, temperature 98.4 F (36.9 C), temperature source Oral, resp. rate 16, height 5\' 8"  (1.727 m), weight 93.5 kg, SpO2 94%.  Intake/Output Summary (Last 24 hours) at 08/20/2023 0932 Last data filed at 08/20/2023 0926 Gross per 24 hour  Intake --  Output 1750 ml  Net  -1750 ml   Filed Weights   08/18/23 0320 08/19/23 0622 08/20/23 0356  Weight: 91.2 kg 93.5 kg 93.5 kg    Examination: General: Alert and conversant though mildly dyspneic -not in extremis Lungs: Faint crackles diffusely but no active wheezing at the time of my exam Cardiovascular: Tachycardic and irregularly irregular with rate approximately 130 bpm Abdomen: Nontender, nondistended, soft, bowel sounds positive, no rebound, no ascites, no appreciable mass Extremities: Trace bilateral lower extremity edema  CBC: Recent Labs  Lab 08/17/23 1437 08/17/23 1939 08/18/23 2105 08/19/23 0213 08/20/23 0431  WBC 6.0  --   --   --  5.5  HGB 7.5*   < > 9.1* 9.2* 9.3*  HCT 23.3*   < > 26.6* 26.4* 27.1*  MCV 98.3  --   --   --  91.9  PLT 141*  --   --   --  159   < > = values in this interval not displayed.   Basic Metabolic Panel: Recent Labs  Lab 08/18/23 0311 08/19/23 0213 08/20/23 0431 08/20/23 0758  NA 136 138 137  --   K 3.9 3.4* 2.6* 3.2*  CL 105 106 104  --  CO2 20* 22 21*  --   GLUCOSE 139* 144* 67*  --   BUN 67* 67* 59*  --   CREATININE 2.46* 2.14* 1.88*  --   CALCIUM 9.1 9.0 8.8*  --   MG 1.6*  --  1.6*  --   PHOS 5.1*  --   --   --    GFR: Estimated Creatinine Clearance: 35.9 mL/min (A) (by C-G formula based on SCr of 1.88 mg/dL (H)).   Scheduled Meds:  allopurinol  100 mg Oral Daily   feeding supplement  1 Container Oral TID BM   folic acid  1 mg Oral Daily   hydrocortisone sod succinate (SOLU-CORTEF) inj  50 mg Intravenous Q12H   insulin aspart  0-9 Units Subcutaneous Q4H   pantoprazole (PROTONIX) IV  40 mg Intravenous Q12H    potassium chloride 10 mEq (08/20/23 0924)     LOS: 3 days   Lonia Blood, MD [facility] Office  [phone] Pager - Text Page per Loretha Stapler  If 7PM-7AM, please contact night-coverage per Amion 08/20/2023, 9:32 AM

## 2023-08-20 NOTE — Progress Notes (Signed)
Patient transported from PACU to 3M03 on BIPAP with RT and RN, no complications noted.

## 2023-08-20 NOTE — Care Management Important Message (Signed)
Important Message  Patient Details  Name: Connor Bryant MRN: [id number] Date of Birth: July 02, 1946   Important Message Given:  Yes - Medicare IM     Sherilyn Banker 08/20/2023, 3:30 PM

## 2023-08-20 NOTE — Progress Notes (Signed)
   Heart Failure Stewardship Pharmacist Progress Note   PCP: Mechele Claude, MD PCP-Cardiologist: Dina Rich, MD    HPI:  77 yo M with PMH of CHF, PVD, RA, T2DM, HTN, afib, PE, CAD, and CVA.  History of HFrEF with EF 35% in 04/2020 during admission for NSTEMI. Discharged on lasix, carvedilol, and hydralazine/imdur. Improved to 60-65% in 06/2020.  Presented to the ED on 11/23 with dizziness, lightheadedness, and hypotensive. Has been ongoing for 1 week. BP 58/43 on arrival. Given 1L fluid and improved to 80/53. Hgb 7.5 and FOBT positive. Admitted for hemorrhagic shock with acute GI bleed. S/p 2 units PRBC and Kcentra (on PTA Eliquis) and Hgb has been stable. Tentative plan for EGD later this admission - planning for 11/26.   CXR with cardiomegaly and diffuse bilateral interstitial pulmonary opacity, consistent with edema vs infection. ECHO 11/24 with LVEF 30-35%, global hypokinesis, RV normal, mild to moderate MR.   Denies shortness of breath and LE edema. Plan for EGD today.  Current HF Medications: None  Prior to admission HF Medications: Beta blocker: carvedilol 25 mg BID ACE/ARB/ARNI: lisinopril 20 mg daily Other: hydralazine 100 mg BID + Imdur 30 mg daily  Pertinent Lab Values: Serum creatinine 2.14>1.88, BUN 59, Potassium 3.2, Sodium 137, BNP 228.1, Magnesium 1.6, A1c 6.8   Vital Signs: Weight: 206 lbs (admission weight: 201 lbs) Blood pressure: 80-100/60s Heart rate: 80-110s  I/O: net -1.5L yesterday  Medication Assistance / Insurance Benefits Check: Does the patient have prescription insurance?  Yes Type of insurance plan: [facility] Medicare  Outpatient Pharmacy:  Prior to admission outpatient pharmacy: [facility] Is the patient willing to use [facility] TOC pharmacy at discharge? Yes Is the patient willing to transition their outpatient pharmacy to utilize a [facility] outpatient pharmacy?   No    Assessment: 1. Acute on chronic systolic CHF (LVEF 30-35%). NYHA  class II symptoms. - GDMT limited with hypotension - Planning EGD 11/26, may be able to start SGLT2i afterward   Plan: 1) Medication changes recommended at this time: - Start Farxiga 10 mg daily after EGD / diet resumed  2) Patient assistance: - Currently in the coverage gap / donut hole - Jardiance copay $153 in donut hole - Farxiga copay $146 in donut hole  3)  Education  - Initial education completed - Full education to be completed prior to discharge  Sharen Hones, PharmD, BCPS Heart Failure Engineer, building services Phone [phone]

## 2023-08-20 NOTE — Transfer of Care (Signed)
Immediate Anesthesia Transfer of Care Note  Patient: Connor Bryant  Procedure(s) Performed: ESOPHAGOGASTRODUODENOSCOPY (EGD) WITH PROPOFOL BIOPSY  Patient Location: Endoscopy Unit  Anesthesia Type:MAC  Level of Consciousness: awake, alert , oriented, and patient cooperative  Airway & Oxygen Therapy: Patient Spontanous Breathing and Patient connected to face mask oxygen  Post-op Assessment: Report given to RN and Post -op Vital signs reviewed and stable  Post vital signs: Reviewed and stable  Last Vitals:  Vitals Value Taken Time  BP 95/62 08/20/23 1300  Temp 36.4 C 08/20/23 1300  Pulse 96 08/20/23 1300  Resp 22 08/20/23 1300  SpO2 97 % 08/20/23 1300  Vitals shown include unfiled device data.  Last Pain:  Vitals:   08/20/23 1300  TempSrc: Temporal  PainSc: Asleep         Complications: No notable events documented.

## 2023-08-20 NOTE — Interval H&P Note (Signed)
History and Physical Interval Note:  08/20/2023 11:40 AM  Connor Bryant  has presented today for surgery, with the diagnosis of anemia, hemoccult positive stoos.  The various methods of treatment have been discussed with the patient and family. After consideration of risks, benefits and other options for treatment, the patient has consented to  Procedure(s): ESOPHAGOGASTRODUODENOSCOPY (EGD) WITH PROPOFOL (N/A) as a surgical intervention.  The patient's history has been reviewed, patient examined, no change in status, stable for surgery.  I have reviewed the patient's chart and labs.  Questions were answered to the patient's satisfaction.     Imogene Burn

## 2023-08-20 NOTE — Op Note (Signed)
[facility] Patient Name: Connor Bryant Procedure Date : 08/20/2023 MRN: [id number] Attending MD: Particia Lather , , [id number] Date of Birth: 03/04/1946 CSN: [id number] Age: 77 Admit Type: Inpatient Procedure:                Upper GI endoscopy Indications:              Melena, Iron deficiency anemia Providers:                Nicole Kindred "Eulah Pont, [place] "Jaci" Clelia Croft, RN,                            Rozetta Nunnery, Technician Referring MD:             Hospitalist team Medicines:                Monitored Anesthesia Care Complications:            No immediate complications. Estimated Blood Loss:     Estimated blood loss was minimal. Procedure:                Pre-Anesthesia Assessment:                           - Prior to the procedure, a History and Physical                            was performed, and patient medications and                            allergies were reviewed. The patient's tolerance of                            previous anesthesia was also reviewed. The risks                            and benefits of the procedure and the sedation                            options and risks were discussed with the patient.                            All questions were answered, and informed consent                            was obtained. Prior Anticoagulants: The patient has                            taken Eliquis (apixaban), last dose was 3 days                            prior to procedure. ASA Grade Assessment: III - A                            patient with severe systemic disease. After  reviewing the risks and benefits, the patient was                            deemed in satisfactory condition to undergo the                            procedure.                           After obtaining informed consent, the endoscope was                            passed under direct vision. Throughout the                             procedure, the patient's blood pressure, pulse, and                            oxygen saturations were monitored continuously. The                            [id number]-H190 ([id number]) Olympus endoscope was introduced                            through the mouth, and advanced to the second part                            of duodenum. The upper GI endoscopy was                            accomplished without difficulty. The patient                            tolerated the procedure well. Scope In: Scope Out: Findings:      Salmon-colored mucosa was present. The maximum longitudinal extent of       these esophageal mucosal changes was 1 cm in length. Mucosa was biopsied       with a cold forceps for histology. One specimen bottle was sent to       pathology.      Localized moderate inflammation characterized by congestion (edema),       erosions, erythema and linear erosions was found in the gastric antrum.       Biopsies were taken with a cold forceps for histology.      Localized nodular mucosa was found in the duodenal bulb. Biopsies were       taken with a cold forceps for histology. Impression:               - Salmon-colored mucosa. Biopsied.                           - Gastritis. Biopsied.                           - Nodular mucosa in the duodenal bulb. Biopsied. Recommendation:           - Return  patient to hospital ward for ongoing care.                           - Source of the patient's melena and IDA may have                            been erosive gastritis exacerbated by use of                            Eliquis.                           - Await pathology results.                           - Use a proton pump inhibitor PO BID for 8 weeks,                            then QD.                           - Avoid NSAID use.                           - Okay to restart Eliquis tomorrow.                           - Will try to add on ferritin and iron/IBC to labs                             from earlier today. If low, patient will need iron                            repletion.                           - We will arrange for GI clinic follow up for                            anemia.                           - The findings and recommendations were discussed                            with the patient. Procedure Code(s):        --- Professional ---                           [phone], Esophagogastroduodenoscopy, flexible,                            transoral; with biopsy, single or multiple Diagnosis Code(s):        --- Professional ---  K22.89, Other specified disease of esophagus                           K29.70, Gastritis, unspecified, without bleeding                           K31.89, Other diseases of stomach and duodenum                           K92.1, Melena (includes Hematochezia)                           D50.9, Iron deficiency anemia, unspecified CPT copyright 2022 American Medical Association. All rights reserved. The codes documented in this report are preliminary and upon coder review may  be revised to meet current compliance requirements. Dr Particia Lather "Alan Ripper" Leonides Schanz,  08/20/2023 1:00:57 PM Number of Addenda: 0

## 2023-08-20 NOTE — Progress Notes (Signed)
Date and time results received: 08/20/23 0610 (use smartphrase ".now" to insert current time)  Test: Potassium  Critical Value: 2.6  Name of Provider Notified: [place] --call page and text page   Orders Received? Or Actions Taken?: review orders

## 2023-08-20 NOTE — Telephone Encounter (Signed)
Imogene Burn, MD  Emeline Darling, RN; Lucky Cowboy, RN Hi Jory Sims, please schedule follow up for IDA and gastric erosions with Dr. Leone Payor or APP at next available. Thanks.

## 2023-08-20 NOTE — Telephone Encounter (Signed)
Scheduled OV for 08/30/23 at 1:30 pm with [place], PA. Pt is currently admitted, will contact him once discharged.

## 2023-08-20 NOTE — Plan of Care (Signed)
°  Problem: Activity: Goal: Risk for activity intolerance will decrease Outcome: Progressing   Problem: Nutrition: Goal: Adequate nutrition will be maintained Outcome: Progressing   Problem: Coping: Goal: Level of anxiety will decrease Outcome: Progressing   

## 2023-08-20 NOTE — Anesthesia Postprocedure Evaluation (Signed)
Anesthesia Post Note  Patient: Connor Bryant  Procedure(s) Performed: ESOPHAGOGASTRODUODENOSCOPY (EGD) WITH PROPOFOL BIOPSY     Patient location during evaluation: PACU Anesthesia Type: MAC Level of consciousness: awake and alert Pain management: pain level controlled Vital Signs Assessment: post-procedure vital signs reviewed and stable Respiratory status: spontaneous breathing and respiratory function unstable (Pt placed on BiPap in PACU and given duoneb for acute hypoxemic resp distress. ICU physician to see pt and pt to ICU for closer monitoring.) Cardiovascular status: stable and blood pressure returned to baseline Postop Assessment: no apparent nausea or vomiting Anesthetic complications: no   No notable events documented.  Last Vitals:  Vitals:   08/20/23 1445 08/20/23 1519  BP: (!) 132/95   Pulse: (!) 134   Resp: (!) 25   Temp: 37 C 37.2 C  SpO2: 95%     Last Pain:  Vitals:   08/20/23 1600  TempSrc:   PainSc: 0-No pain                 Kennieth Rad

## 2023-08-20 NOTE — Progress Notes (Signed)
Pt. Is resting off bipap at this time comfortably and talking on 4L [place]. VS are stable at this time

## 2023-08-20 NOTE — Progress Notes (Signed)
08/20/2023 Rediscussed code status in 3M.  Patient confirms full code.  Myrla Halsted MD PCCM

## 2023-08-20 NOTE — Anesthesia Preprocedure Evaluation (Signed)
Anesthesia Evaluation  Patient identified by MRN, date of birth, ID band Patient awake    Reviewed: Allergy & Precautions, NPO status , Patient's Chart, lab work & pertinent test results  History of Anesthesia Complications Negative for: history of anesthetic complications  Airway Mallampati: III  TM Distance: >3 FB Neck ROM: Full    Dental  (+) Teeth Intact, Dental Advisory Given   Pulmonary neg shortness of breath, neg COPD, neg recent URI, former smoker   breath sounds clear to auscultation       Cardiovascular hypertension, Pt. on medications (-) angina + Past MI and + Peripheral Vascular Disease  + Valvular Problems/Murmurs  Rhythm:Irregular + Systolic murmurs  1. Left ventricular ejection fraction, by estimation, is 30 to 35%. The  left ventricle has moderately decreased function. The left ventricle  demonstrates global hypokinesis. Left ventricular diastolic function could  not be evaluated.   2. Right ventricular systolic function is normal. The right ventricular  size is mildly enlarged. There is moderately elevated pulmonary artery  systolic pressure. The estimated right ventricular systolic pressure is  59.6 mmHg.   3. Left atrial size was mildly dilated.   4. The mitral valve is abnormal. Mild to moderate mitral valve  regurgitation. Mild mitral stenosis. The mean mitral valve gradient is 5.0  mmHg. Moderate to severe mitral annular calcification.   5. The aortic valve was not well visualized. There is severe calcifcation  of the aortic valve. There is severe thickening of the aortic valve.  Aortic valve regurgitation is not visualized. Moderate aortic valve  stenosis. Aortic valve mean gradient  measures 7.6 mmHg. Aortic valve Vmax measures 1.90 m/s.   6. The inferior vena cava is dilated in size with <50% respiratory  variability, suggesting right atrial pressure of 15 mmHg.     Neuro/Psych  Neuromuscular  disease CVA, Residual Symptoms    GI/Hepatic Neg liver ROS,,,? GI bleed   Endo/Other  diabetes    Renal/GU Renal diseaseLab Results      Component                Value               Date                      NA                       137                 08/20/2023                K                        3.2 (L)             08/20/2023                CO2                      21 (L)              08/20/2023                GLUCOSE                  67 (L)              08/20/2023  BUN                      59 (H)              08/20/2023                CREATININE               1.88 (H)            08/20/2023                CALCIUM                  8.8 (L)             08/20/2023                EGFR                     42 (L)              05/23/2023                GFRNONAA                 36 (L)              08/20/2023           \     Musculoskeletal  (+) Arthritis ,    Abdominal   Peds  Hematology  (+) Blood dyscrasia, anemia Lab Results      Component                Value               Date                      WBC                      5.5                 08/20/2023                HGB                      9.3 (L)             08/20/2023                HCT                      27.1 (L)            08/20/2023                MCV                      91.9                08/20/2023                PLT                      159                 08/20/2023              Anesthesia Other Findings   Reproductive/Obstetrics  Anesthesia Physical Anesthesia Plan  ASA: 4  Anesthesia Plan: MAC   Post-op Pain Management: Minimal or no pain anticipated   Induction: Intravenous  PONV Risk Score and Plan: 1 and Propofol infusion  Airway Management Planned: Simple Face Mask, Natural Airway and Nasal Cannula  Additional Equipment: None  Intra-op Plan:   Post-operative Plan:   Informed Consent: I have reviewed the patients  History and Physical, chart, labs and discussed the procedure including the risks, benefits and alternatives for the proposed anesthesia with the patient or authorized representative who has indicated his/her understanding and acceptance.     Dental advisory given  Plan Discussed with: CRNA  Anesthesia Plan Comments:          Anesthesia Quick Evaluation

## 2023-08-20 NOTE — Progress Notes (Addendum)
NAME:  Connor Bryant, MRN:  [id number], DOB:  November 29, 1945, LOS: 3 ADMISSION DATE:  08/17/2023, CONSULTATION DATE:  11/23 REFERRING MD:  Dr. Posey Rea EDP, CHIEF COMPLAINT:  GI bleed   History of Present Illness:  77 year old male with PMH as below, which is significant for RA recently started on methotrexate, prednisone, Atrial fibrillation and Eliquis, DM, HLD, and stroke.  He presented to [facility] emergency department on 11/23 with complaints of weakness and lightheadedness starting 2 weeks prior to presentation.  Symptoms really intensified about 1 week prior to arrival.  On the day of presentation he experienced near syncope when rising to a standing position from sitting which prompted him to present to the emergency department via EMS.  Upon EMS arrival systolic blood pressure was noted to be 58.  He was infused with 1 L of normal saline and systolic blood pressure improved to the 80s.  He has been taking his multiple antihypertensives as prescribed.  Also described dark stools intermittently over period of weeks.  Laboratory evaluation significant for hemoglobin 7.5 (which was 10.5 in August), INR 2.1, troponin elevated at 546, BUN 70, and creatinine 2.39.  Despite 2 units of packed red blood cells and IV fluid resuscitation borderline hypotension remained and the patient was transferred to [facility] for ICU care with PCCM admitting.  Pertinent  Medical History   has a past medical history of Acute CVA (cerebrovascular accident) (HCC) (05/01/2020), Acute respiratory failure with hypoxia (HCC) (05/04/2020), Arthritis, DM type 2 (diabetes mellitus, type 2) (HCC), Endotracheally intubated, Gallstone, Heart murmur, HTN (hypertension), Hyperlipidemia, Personal history of colonic polyp-adenoma (11/24/2013), Renal insufficiency, and Thoracic spine fracture (HCC).   Significant Hospital Events: Including procedures, antibiotic start and stop dates in addition to other pertinent events   11/23 admitted  for hypotension secondary to acute blood loss anemia and GI bleed 11/24 transferred out of ICU to [facility] for 11/25 pickup 11/26 EGD with findings of salmon colored mucosa of esophagus, gastritis, nodular mucosa in duodenal bulb.   Interim History / Subjective:  Dyspneic after EGD. Received 20mg  Lasix and put on BiPAP in PACU. PCCM consulted.  Objective   Blood pressure 133/84, pulse (!) 130, temperature 98.4 F (36.9 C), resp. rate (!) 22, height 5\' 8"  (1.727 m), weight 93.5 kg, SpO2 94%.    FiO2 (%):  [40 %] 40 %   Intake/Output Summary (Last 24 hours) at 08/20/2023 1414 Last data filed at 08/20/2023 1301 Gross per 24 hour  Intake 600 ml  Output 1750 ml  Net -1150 ml   Filed Weights   08/19/23 0622 08/20/23 0356 08/20/23 1105  Weight: 93.5 kg 93.5 kg 93.5 kg    Examination: General: Elderly appearing male, on BiPAP in PACU, in no acute distress HENT: [place]/AT. BiPAP in place Lungs: Respirations shallow with low tidal volumes/air movement. Expiratory wheezes bilaterally. On BiPAP. Cardiovascular: Regular rate and rhythm, no peripheral edema Abdomen: Soft, nontender, nondistended Extremities: No acute deformity or range of motion imitation Neuro: Alert, oriented, non-focal   Assessment & Plan:   Hypotension - resolved. Secondary to GI bleed and acute blood loss anemia.  Blood pressure appears to be improved now status post 2 units of packed red blood cells and IV fluids.  Fecal occult blood positive. - Supportive care. - Monitor for ongoing bleeding. - Holding home amlodipine, carvedilol, chlorthalidone, hydralazine, isosorbide, lisinopril.  Gastrointestinal bleed: EGD 11/26 with findings of erosive gastritis. Bleeding felt to be 2/2 this exacerbated by Eliquis use. He is  a 2-3 beer a day drinker which stopped about 1 month ago.  Does not use NSAIDs.  Takes Eliquis, prednisone, and methotrexate all increasing risk.  Acute blood loss and iron deficiency anemia. - GI following,  appreciate the assistance. - PPI BID x 8 weeks. - Avoid NSAIDs. - OK for Eliquis to resume again tomorrow 11/27.  Acute hypoxic respiratory failure with increased WOB following EGD - unclear etiology at thus far but appears a component of bronchospasm at play and possible acute pulmonary edema, s/p 20mg  Lasix - Repeat CXR now. - Get EKG. - Cycle trops. - Continue BiPAP for now. - Solumedrol 80mg  x 1. - Levalbuterol, DuoNebs. - Diuresis as able/needed. - He is reluctant to agreeing to mechanical ventilation if he were to worsen. He wants some time to think about this before agreeing to it. Understands that he would be declaring himself as DNR if he were to go that route.  Acute sCHF 0 TTE 11/24 with EF 30-35% with global hypokinesis and mildly enlarged RV with normal systolic function, Mil to mod MR and MS, mod AS. - Diuresis as needed. - EKG and troponins now. - Holding home amlodipine, carvedilol, chlorthalidone, hydralazine, isosorbide, lisinopril.  AKI on CKD 3a. Hypokalemia - repleted. Hypomagnesemia -repleted. - Diuresis as needed. - Correct electrolytes as needed. - Follow BMP.  Diabetes mellitus. - holding home glimepiride. - CBG monitoring and SSI.  RA - seems like intermittent compliance with both methotrexate and prednisone, questionable compliance. He is s/p 3 days of stress steroids given baseline prednisone use. - D/c stress steroids as ideally not continue high dose with his gastritis.  Gout - continue allopurinol   Transfer to ICU for closer monitoring through the afternoon and tonight. OK to remain on [facility] service for now, if decompensates, PCCM to take over. Continue BiPAP as needed and try to wean him off this afternoon.  Best Practice (right click and "Reselect all SmartList Selections" daily)   Diet/type: NPO DVT prophylaxis: SCDs Start: 08/17/23 1921   Pressure ulcer(s): not present on admission  GI prophylaxis: PPI Lines: N/A Foley:  N/A Code  Status:  full code - per discussion 11/23 Last date of multidisciplinary goals of care discussion [patient updated at bedside]  Critical care time: 30 min.    Rutherford Guys, PA - [facility] For pager details, please see AMION or use Epic chat  After 1900, please call [facility] for cross coverage needs 08/20/2023, 2:41 PM

## 2023-08-21 DIAGNOSIS — I5020 Unspecified systolic (congestive) heart failure: Secondary | ICD-10-CM

## 2023-08-21 DIAGNOSIS — J9601 Acute respiratory failure with hypoxia: Secondary | ICD-10-CM | POA: Diagnosis not present

## 2023-08-21 DIAGNOSIS — K922 Gastrointestinal hemorrhage, unspecified: Secondary | ICD-10-CM | POA: Diagnosis not present

## 2023-08-21 DIAGNOSIS — I4891 Unspecified atrial fibrillation: Secondary | ICD-10-CM

## 2023-08-21 LAB — BASIC METABOLIC PANEL
Anion gap: 9 (ref 5–15)
BUN: 62 mg/dL — ABNORMAL HIGH (ref 8–23)
CO2: 24 mmol/L (ref 22–32)
Calcium: 8.3 mg/dL — ABNORMAL LOW (ref 8.9–10.3)
Chloride: 102 mmol/L (ref 98–111)
Creatinine, Ser: 2.15 mg/dL — ABNORMAL HIGH (ref 0.61–1.24)
GFR, Estimated: 31 mL/min — ABNORMAL LOW (ref >60)
Glucose, Bld: 250 mg/dL — ABNORMAL HIGH (ref 70–99)
Potassium: 3.7 mmol/L (ref 3.5–5.1)
Sodium: 135 mmol/L (ref 135–145)

## 2023-08-21 LAB — MAGNESIUM: Magnesium: 2.2 mg/dL (ref 1.7–2.4)

## 2023-08-21 LAB — CBC
HCT: 25.8 % — ABNORMAL LOW (ref 39.0–52.0)
Hemoglobin: 8.7 g/dL — ABNORMAL LOW (ref 13.0–17.0)
MCH: 31.3 pg (ref 26.0–34.0)
MCHC: 33.7 g/dL (ref 30.0–36.0)
MCV: 92.8 fL (ref 80.0–100.0)
Platelets: 153 10*3/uL (ref 150–400)
RBC: 2.78 MIL/uL — ABNORMAL LOW (ref 4.22–5.81)
RDW: 17.2 % — ABNORMAL HIGH (ref 11.5–15.5)
WBC: 4.5 10*3/uL (ref 4.0–10.5)
nRBC: 0 % (ref 0.0–0.2)

## 2023-08-21 LAB — GLUCOSE, CAPILLARY
Glucose-Capillary: 238 mg/dL — ABNORMAL HIGH (ref 70–99)
Glucose-Capillary: 165 mg/dL — ABNORMAL HIGH (ref 70–99)
Glucose-Capillary: 210 mg/dL — ABNORMAL HIGH (ref 70–99)
Glucose-Capillary: 253 mg/dL — ABNORMAL HIGH (ref 70–99)
Glucose-Capillary: 209 mg/dL — ABNORMAL HIGH (ref 70–99)

## 2023-08-21 LAB — PHOSPHORUS: Phosphorus: 3 mg/dL (ref 2.5–4.6)

## 2023-08-21 MED ORDER — POTASSIUM CHLORIDE CRYS ER 20 MEQ PO TBCR
40.0000 meq | EXTENDED_RELEASE_TABLET | Freq: Once | ORAL | Status: AC
  Administered 2023-08-21: 40 meq via ORAL
  Filled 2023-08-21: qty 2

## 2023-08-21 MED ORDER — IRON SUCROSE 500 MG IVPB - SIMPLE MED
500.0000 mg | INTRAVENOUS | Status: DC
  Filled 2023-08-21: qty 275

## 2023-08-21 MED ORDER — AMIODARONE HCL 200 MG PO TABS
200.0000 mg | ORAL_TABLET | Freq: Two times a day (BID) | ORAL | Status: DC
  Administered 2023-08-21 – 2023-08-22 (×4): 200 mg via ORAL
  Filled 2023-08-21 (×4): qty 1

## 2023-08-21 MED ORDER — AMIODARONE HCL 200 MG PO TABS: 200.0000 mg | ORAL_TABLET | Freq: Every day | ORAL | Status: DC

## 2023-08-21 MED ORDER — FUROSEMIDE 10 MG/ML IJ SOLN
40.0000 mg | Freq: Once | INTRAMUSCULAR | Status: AC
  Administered 2023-08-21: 40 mg via INTRAVENOUS
  Filled 2023-08-21: qty 4

## 2023-08-21 MED ORDER — APIXABAN 5 MG PO TABS
5.0000 mg | ORAL_TABLET | Freq: Two times a day (BID) | ORAL | Status: DC
  Administered 2023-08-21 – 2023-08-23 (×5): 5 mg via ORAL
  Filled 2023-08-21 (×5): qty 1

## 2023-08-21 MED ORDER — CARVEDILOL 12.5 MG PO TABS
12.5000 mg | ORAL_TABLET | Freq: Two times a day (BID) | ORAL | Status: DC
  Administered 2023-08-21 – 2023-08-23 (×5): 12.5 mg via ORAL
  Filled 2023-08-21 (×5): qty 1

## 2023-08-21 MED ORDER — SODIUM CHLORIDE 0.9 % IV SOLN
500.0000 mg | INTRAVENOUS | Status: AC
  Administered 2023-08-21 – 2023-08-22 (×2): 500 mg via INTRAVENOUS
  Filled 2023-08-21 (×2): qty 25

## 2023-08-21 MED ORDER — INSULIN ASPART 100 UNIT/ML IJ SOLN
0.0000 [IU] | Freq: Three times a day (TID) | INTRAMUSCULAR | Status: DC
  Administered 2023-08-21: 3 [IU] via SUBCUTANEOUS
  Administered 2023-08-22: 2 [IU] via SUBCUTANEOUS
  Administered 2023-08-22: 1 [IU] via SUBCUTANEOUS
  Administered 2023-08-22: 2 [IU] via SUBCUTANEOUS
  Administered 2023-08-23: 3 [IU] via SUBCUTANEOUS

## 2023-08-21 MED ORDER — PANTOPRAZOLE SODIUM 40 MG PO TBEC
40.0000 mg | DELAYED_RELEASE_TABLET | Freq: Two times a day (BID) | ORAL | Status: DC
  Administered 2023-08-21 – 2023-08-23 (×5): 40 mg via ORAL
  Filled 2023-08-21 (×5): qty 1

## 2023-08-21 NOTE — Progress Notes (Signed)
NAME:  Connor Bryant, MRN:  [id number], DOB:  Jun 25, 1946, LOS: 4 ADMISSION DATE:  08/17/2023, CONSULTATION DATE:  11/23 REFERRING MD:  Dr. Posey Rea EDP, CHIEF COMPLAINT:  GI bleed   History of Present Illness:  77 year old male with PMH as below, which is significant for RA recently started on methotrexate, prednisone, Atrial fibrillation and Eliquis, DM, HLD, and stroke.  He presented to [facility] emergency department on 11/23 with complaints of weakness and lightheadedness starting 2 weeks prior to presentation.  Symptoms really intensified about 1 week prior to arrival.  On the day of presentation he experienced near syncope when rising to a standing position from sitting which prompted him to present to the emergency department via EMS.  Upon EMS arrival systolic blood pressure was noted to be 58.  He was infused with 1 L of normal saline and systolic blood pressure improved to the 80s.  He has been taking his multiple antihypertensives as prescribed.  Also described dark stools intermittently over period of weeks.  Laboratory evaluation significant for hemoglobin 7.5 (which was 10.5 in August), INR 2.1, troponin elevated at 546, BUN 70, and creatinine 2.39.  Despite 2 units of packed red blood cells and IV fluid resuscitation borderline hypotension remained and the patient was transferred to [facility] for ICU care with PCCM admitting.  Pertinent  Medical History   has a past medical history of Acute CVA (cerebrovascular accident) (HCC) (05/01/2020), Acute respiratory failure with hypoxia (HCC) (05/04/2020), Arthritis, DM type 2 (diabetes mellitus, type 2) (HCC), Endotracheally intubated, Gallstone, Heart murmur, HTN (hypertension), Hyperlipidemia, Personal history of colonic polyp-adenoma (11/24/2013), Renal insufficiency, and Thoracic spine fracture (HCC).   Significant Hospital Events: Including procedures, antibiotic start and stop dates in addition to other pertinent events   11/23 admitted  for hypotension secondary to acute blood loss anemia and GI bleed 11/24 transferred out of ICU to [facility] for 11/25 pickup 11/26 EGD with findings of salmon colored mucosa of esophagus, gastritis, nodular mucosa in duodenal bulb. Dyspneic after EGD. Received 20mg  Lasix and put on BiPAP in PACU. PCCM consulted.  Interim History / Subjective:  Feels better this morning.   Objective   Blood pressure 100/67, pulse 88, temperature 97.6 F (36.4 C), temperature source Oral, resp. rate (!) 21, height 5\' 8"  (1.727 m), weight 93.7 kg, SpO2 100%.    FiO2 (%):  [40 %] 40 %   Intake/Output Summary (Last 24 hours) at 08/21/2023 0829 Last data filed at 08/21/2023 0600 Gross per 24 hour  Intake 1226.15 ml  Output 651 ml  Net 575.15 ml   Filed Weights   08/20/23 1105 08/20/23 1519 08/21/23 0352  Weight: 93.5 kg 90.3 kg 93.7 kg   Examination: General: Elderly  male,  HENT: [place]/AT.  Off BiPAP. No pressure injury Lungs: no respiratory distress.  Vesicular breath sounds throughout without wheezing.  Few crackles at both bases. Cardiovascular: Regular rate and rhythm, no peripheral edema, JVP elevated at 4 cm above sternal angle with HJR. Abdomen: Soft, nontender, nondistended Extremities: No ankle edema.  Bilateral varicose veins. Neuro: Alert, oriented, non-focal, fully conversant.  Ancillary Tests:   Creatinine has risen to 2.15 Troponin elevation consistent with demand ischemia. Hemoglobin essentially stable at 8.7. Assessment & Plan:   Acute hypoxic respiratory failure secondary to volume overload causing pulmonary edema in the context of known HFrEF with an EF of 30%-now off BiPAP. Demand related ischemia secondary to above Reactive airways disease secondary to cardiac asthma.  Now resolved. Atrial  fibrillation with rapid ventricular response in the context of respiratory distress. Gastrointestinal bleeding secondary to erosive gastritis, exacerbated by Eliquis use, prednisone use and alcohol  consumption Rheumatoid arthritis CKD 3 with superimposed AKI Chronic gout  Plan:  -Now off BiPAP.  Wean O2 to keep saturation greater than 92%.  Progressive ambulation. -Diurese again today.  Follow creatinine. -Gradually reintroduce GDMT.  Medications could probably be rationalized: He is on Norvasc, hydralazine, isosorbide and lisinopril but would be better served by starting sacubitril/valsartan.  Will consult cardiology to see and potentially rationalize his medication regimen.  Has not received ACEi since 11/23. Will start [place] today as previously planned.  -Stop amiodarone IV and resume carvedilol at half home dose due to marginal blood pressure. -Stop further steroids.  Do not feel that patient is having COPD exacerbation. -Leave on as needed bronchodilators only.  Not on any inhaled therapy at home. -Continue PPI twice daily.  Resume Eliquis today as previously suggested by GI.   Best Practice (right click and "Reselect all SmartList Selections" daily)   Diet/type: NPO advance diet to regular today. DVT prophylaxis: SCDs Start: 08/17/23 1921   Pressure ulcer(s): not present on admission  GI prophylaxis: PPI for gastritis. Lines: N/A Foley:  N/A Code Status:  full code - per discussion 11/23 Last date of multidisciplinary goals of care discussion [patient updated at bedside]  Lynnell Catalan, MD [facility] ICU Physician [facility] [place] [facility]  Pager: [phone] Or Epic Secure Chat After hours: [phone].  08/21/2023, 9:12 AM

## 2023-08-21 NOTE — Consult Note (Addendum)
Cardiology Consultation   Patient ID: Connor Bryant MRN: [id number]; DOB: 13-Feb-1946  Admit date: 08/17/2023 Date of Consult: 08/21/2023  PCP:  Mechele Claude, MD   Connor Bryant Cardiologist:  Dina Rich, MD        Patient Profile:   Connor Bryant is a 76 y.o. male with a hx of peripheral artery disease status post prior mesenteric artery bypass and bilateral iliac stenting, carotid stenosis, hyperlipidemia, CKD, atrial fibrillation, aortic stenosis, pulmonary embolism, CVA, anemia, hypertension, DM, rheumatoid arthritis who is being seen 08/21/2023 for the evaluation of abnormal echocardiogram and for GDMT recommendations at the request of Dr. Denese Killings.  History of Present Illness:   Connor Bryant.  His last follow-up appointment was in July of this year.  Patient's cardiac history notable for non-ST elevation myocardial infarction and stress-induced cardiomyopathy.  This was during an admission in 2021 when patient initially presented with CVA.  An echocardiogram done at the time showed left ventricular ejection fraction 35% with apical akinesis.  Differential at the timencluded acute plaque rupture versus coronary embolic event (given that he had presented with new onset A-fib) versus stress induced myopathy Takotsubo cardiomyopathy.  Given the stroke, patient was ultimately not taken for catheterization.  However a repeat echocardiogram in October 2021 showed recovery to normal ventricular ejection fraction 60 to 65% with no regional wall motion abnormalities.  Patient presented to the [facility] emergency department on November 23 with complaints of weakness and lightheadedness that have been ongoing for 2 weeks.  Symptoms became notably more intense about a week prior to his presentation to the emergency department.  On the day that he presented to the ED, patient experienced near syncope upon  standing from a sitting position.  This prompted activation of EMS.  Per notes, EMS found patient's systolic blood pressure to be low at 58.  He received a liter of normal saline and route to the hospital and blood pressure improved to the 80s per notes.  Patient reported to hospital Bryant dark stools for a period of several weeks.  Initial labs indicated anemia with a hemoglobin of 7.5 (previously 10.5 as of August of this year).  Troponin elevated at 546 with a creatinine of 2.39.  Patient was transfused with 2 units of packed red blood cells and received additional IV fluid resuscitation but remained hypotensive and was subsequently transferred to [facility] intensive care unit.  Initial workup in the ICU at [facility] included an echocardiogram that noted left ventricular ejection fraction reduced to 30 to 35% with global hypokinesis.  Patient then transferred out of the ICU on November 25.  Patient underwent an endoscopy on November 26 which found "salmon-colored mucosa of esophagus, gastritis, nodular mucosa in duodenal bulb."  Patient became notably dyspneic following his endoscopy and placed on BiPAP.  Pulmonary critical care was reengaged.  Patient treated for both volume overload as well as bronchospasm.  A limited repeat echocardiogram was completed given his acute dyspnea with increased cardiomyopathy noted.  LVEF read on second echocardiogram at 20 to 25% with "mid and distal anterior wall, mid and distal lateral wall, mid and  distal anterior septum, mid and distal inferior wall, mid anterolateral segment,  and mid inferoseptal segment akinetic."   Cardiology engaged today for assistance in management of guideline directed medical therapy for heart failure.  On exam today, patient appears comfortable.  He confirms above history and reiterates that he  has not had any recent chest pain or lower extremity edema/orthopnea.  He confirms shortness of breath but says that this was only with exertion of  profound fatigue prior to admission.  Denies any recent sensation of palpitations/awareness of atrial fibrillation.  Past Medical History:  Diagnosis Date   Acute CVA (cerebrovascular accident) (HCC) 05/01/2020   Acute respiratory failure with hypoxia (HCC) 05/04/2020   Arthritis    DM type 2 (diabetes mellitus, type 2) (HCC)    no meds   Endotracheally intubated    Gallstone    Heart murmur    was told one time he had a murmur, not since 30 years ago   HTN (hypertension)    Hyperlipidemia    Personal history of colonic polyp-adenoma 11/24/2013   Renal insufficiency    mild   Thoracic spine fracture [facility])     Past Surgical History:  Procedure Laterality Date   ABDOMINAL AORTAGRAM N/A 02/23/2014   Procedure: ABDOMINAL Ronny Flurry;  Surgeon: Fransisco Hertz, MD;  Location: [facility] CATH LAB;  Service: Cardiovascular;  Laterality: N/A;   CATARACT EXTRACTION, BILATERAL     COLONOSCOPY     DEBRIDEMENT TENNIS ELBOW Right    ESOPHAGOGASTRODUODENOSCOPY     MESENTERIC ARTERY BYPASS N/A 03/10/2014   Procedure: AORTO TO SUPERIOR MESENTERIC ARTERY BYPASS;  Surgeon: Pryor Ochoa, MD;  Location: [facility] OR;  Service: Vascular;  Laterality: N/A;   TONSILLECTOMY AND ADENOIDECTOMY     vascualr surgery     illiac stents     Home Medications:  Prior to Admission medications   Medication Sig Start Date End Date Taking? Authorizing Provider  allopurinol (ZYLOPRIM) 100 MG tablet TAKE 1 TABLET 2 TO 3 TIMES A DAY FOR GOUT Patient taking differently: Take 100 mg by mouth daily. 07/17/23  Yes Stacks, Broadus John, MD  amLODipine (NORVASC) 5 MG tablet Take 1 tablet (5 mg total) by mouth daily. For blood pressure 11/20/22  Yes Stacks, Broadus John, MD  apixaban (ELIQUIS) 5 MG TABS tablet Take 1 tablet (5 mg total) by mouth 2 (two) times daily. 11/20/22  Yes Mechele Claude, MD  atorvastatin (LIPITOR) 80 MG tablet Take 1 tablet (80 mg total) by mouth daily. 11/20/22  Yes Stacks, Broadus John, MD  calcium carbonate (OSCAL) 1500 (600 Ca)  MG TABS tablet Take 600 mg of elemental calcium by mouth daily with breakfast.   Yes [provider]  carvedilol (COREG) 25 MG tablet TAKE ONE TABLET TWICE DAILY 07/23/23  Yes Branch, Dorothe Pea, MD  chlorthalidone (HYGROTON) 25 MG tablet Take 25 mg by mouth every morning. 12/12/21  Yes [provider]  ferrous sulfate 325 (65 FE) MG EC tablet Take 325 mg by mouth daily with breakfast.   Yes [provider]  folic acid (FOLVITE) 1 MG tablet Take 1 tablet (1 mg total) by mouth daily. 08/01/23  Yes Stacks, Broadus John, MD  glimepiride (AMARYL) 2 MG tablet TAKE ONE TABLET ONCE DAILY EVERY MORNING Patient taking differently: Take 2 mg by mouth daily with breakfast. 11/20/22  Yes Mechele Claude, MD  hydrALAZINE (APRESOLINE) 100 MG tablet Take 1 tablet (100 mg total) by mouth 3 (three) times daily. Patient taking differently: Take 100 mg by mouth 2 (two) times daily. 11/20/22  Yes Mechele Claude, MD  isosorbide mononitrate (IMDUR) 30 MG 24 hr tablet TAKE ONE TABLET BY MOUTH DAILY 11/28/22  Yes Branch, Dorothe Pea, MD  levocetirizine (XYZAL) 5 MG tablet TAKE ONE TABLET EVERY EVENING 12/18/22  Yes Mechele Claude, MD  lisinopril (ZESTRIL) 20  MG tablet Take 1 tablet (20 mg total) by mouth daily. 11/20/22 11/15/23 Yes Stacks, Broadus John, MD  methotrexate (RHEUMATREX) 2.5 MG tablet Take 4 tablets (10 mg total) by mouth once a week. Caution:Chemotherapy. Protect from light. Patient taking differently: Take 5 mg by mouth once a week. Caution:Chemotherapy. Protect from light. 08/01/23  Yes Stacks, Broadus John, MD  polyethylene glycol (MIRALAX / GLYCOLAX) packet Take 17 g by mouth daily. 10/02/18  Yes Leatha Gilding, MD  predniSONE (DELTASONE) 10 MG tablet Take 1 tablet (10 mg total) by mouth daily with breakfast. 06/25/23  Yes Mechele Claude, MD  pyridOXINE (B-6) 50 MG tablet Take 1 tablet (50 mg total) by mouth daily. 08/01/23  Yes Mechele Claude, MD  Blood Glucose Monitoring Suppl DEVI 1 each by Does not apply  route in the morning, at noon, and at bedtime. May substitute to any manufacturer covered by patient's insurance. 06/25/23   Mechele Claude, MD    Inpatient Medications: Scheduled Meds:  allopurinol  100 mg Oral Daily   apixaban  5 mg Oral BID   carvedilol  12.5 mg Oral BID WC   Chlorhexidine Gluconate Cloth  6 each Topical Daily   feeding supplement  1 Container Oral TID BM   folic acid  1 mg Oral Daily   insulin aspart  0-9 Units Subcutaneous Q4H   pantoprazole  40 mg Oral BID   Continuous Infusions:  iron sucrose     PRN Meds: acetaminophen, dextrose, docusate sodium, levalbuterol, polyethylene glycol  Allergies:   No Known Allergies  Social History:   Social History   Socioeconomic History   Marital status: Widowed    Spouse name: Not on file   Number of children: 1   Years of education: Not on file   Highest education level: High school graduate  Occupational History   Occupation: Retired  Tobacco Use   Smoking status: Former    Current packs/day: 0.00    Types: Cigarettes    Quit date: 07/08/2009    Years since quitting: 14.1   Smokeless tobacco: Never  Vaping Use   Vaping status: Never Used  Substance and Sexual Activity   Alcohol use: Not Currently   Drug use: Not Currently   Sexual activity: Yes  Other Topics Concern   Not on file  Social History Narrative   Patient is married, he is retired. 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   Social Determinants of Health   Financial Resource Strain: Low Risk  (08/19/2023)   Overall Financial Resource Strain (CARDIA)    Difficulty of Paying Living Expenses: Not very hard  Food Insecurity: No Food Insecurity (08/19/2023)   Hunger Vital Sign    Worried About Running Out of Food in the Last Year: Never true    Ran Out of Food in the Last Year: Never true  Transportation Needs: No Transportation Needs (08/19/2023)   PRAPARE - Administrator, Civil Service (Medical): No    Lack of  Transportation (Non-Medical): No  Physical Activity: Insufficiently Active (12/03/2022)   Exercise Vital Sign    Days of Exercise per Week: 3 days    Minutes of Exercise per Session: 30 min  Stress: No Stress Concern Present (12/03/2022)   [employer] of Occupational Health - Occupational Stress Questionnaire    Feeling of Stress : Not at all  Social Connections: Moderately Isolated (12/03/2022)   Social Connection and Isolation Panel [NHANES]    Frequency of Communication with Friends and  Family: More than three times a week    Frequency of Social Gatherings with Friends and Family: More than three times a week    Attends Religious Services: Never    Database administrator or Organizations: No    Attends Banker Meetings: Never    Marital Status: Married  Catering manager Violence: Not At Risk (08/19/2023)   Humiliation, Afraid, Rape, and Kick questionnaire    Fear of Current or Ex-Partner: No    Emotionally Abused: No    Physically Abused: No    Sexually Abused: No    Family History:    Family History  Problem Relation Age of Onset   Diabetes Mother    Heart disease Mother        before age 33   Hyperlipidemia Mother    Hypertension Mother    AAA (abdominal aortic aneurysm) Mother    Stroke Father    Hypertension Father    Coronary artery disease Father    Hyperlipidemia Father    Diabetes Father    Bladder Cancer Father    Cancer Father    Heart disease Father    Heart attack Father    Colon cancer Neg Hx    Throat cancer Neg Hx    Kidney disease Neg Hx    Liver disease Neg Hx      ROS:  Please see the history of present illness.   All other ROS reviewed and negative.     Physical Exam/Data:   Vitals:   08/21/23 0900 08/21/23 1000 08/21/23 1100 08/21/23 1126  BP: 106/67 108/88 96/74   Pulse: 85 89 87   Resp: (!) 21 (!) 21 16   Temp:    (!) 97.5 F (36.4 C)  TempSrc:    Oral  SpO2: 97% 98% 92%   Weight:      Height:         Intake/Output Summary (Last 24 hours) at 08/21/2023 1201 Last data filed at 08/21/2023 1000 Gross per 24 hour  Intake 1552.59 ml  Output 451 ml  Net 1101.59 ml      08/21/2023    3:52 AM 08/20/2023    3:19 PM 08/20/2023   11:05 AM  Last 3 Weights  Weight (lbs) 206 lb 9.1 oz 199 lb 1.2 oz 206 lb 2.1 oz  Weight (kg) 93.7 kg 90.3 kg 93.5 kg     Body mass index is 31.41 kg/m.  General:  Well nourished, well developed, in no acute distress HEENT: normal Neck: no JVD Vascular: No carotid bruits; Distal pulses 2+ bilaterally Cardiac:  normal S1, S2; RRR; 2/6 RUSB blowing systolic murmur (consistent with moderate AS as noted on TTE). Lungs:  clear to auscultation bilaterally, no wheezing, rhonchi or rales  Abd: soft, nontender, no hepatomegaly  Ext: no edema Musculoskeletal:  No deformities, BUE and BLE strength normal and equal Skin: warm and dry  Neuro:  CNs 2-12 intact, no focal abnormalities noted Psych:  Normal affect   EKG:  The EKG was personally reviewed and demonstrates: ED ECG from 11/23 reviewed.  Atrial fibrillation with rapid ventricular response with ventricular rate approximately 112 bpm.  Nonspecific/borderline ST depression in inferior and anterior leads. Telemetry:  Telemetry was personally reviewed and demonstrates: Persistent atrial fibrillation.  Rapid ventricular response with ventricular rates in the 120s to 130s yesterday afternoon.  Controlled rates today with no RVR noted.  Relevant CV Studies:  08/18/23 Complete TTE  IMPRESSIONS     1. Left ventricular  ejection fraction, by estimation, is 30 to 35%. The  left ventricle has moderately decreased function. The left ventricle  demonstrates global hypokinesis. Left ventricular diastolic function could  not be evaluated.   2. Right ventricular systolic function is normal. The right ventricular  size is mildly enlarged. There is moderately elevated pulmonary artery  systolic pressure. The estimated  right ventricular systolic pressure is  59.6 mmHg.   3. Left atrial size was mildly dilated.   4. The mitral valve is abnormal. Mild to moderate mitral valve  regurgitation. Mild mitral stenosis. The mean mitral valve gradient is 5.0  mmHg. Moderate to severe mitral annular calcification.   5. The aortic valve was not well visualized. There is severe calcifcation  of the aortic valve. There is severe thickening of the aortic valve.  Aortic valve regurgitation is not visualized. Moderate aortic valve  stenosis. Aortic valve mean gradient  measures 7.6 mmHg. Aortic valve Vmax measures 1.90 m/s.   6. The inferior vena cava is dilated in size with <50% respiratory  variability, suggesting right atrial pressure of 15 mmHg.   FINDINGS   Left Ventricle: Left ventricular ejection fraction, by estimation, is 30  to 35%. The left ventricle has moderately decreased function. The left  ventricle demonstrates global hypokinesis. The left ventricular internal  cavity size was normal in size.  There is no left ventricular hypertrophy. Left ventricular diastolic  function could not be evaluated due to atrial fibrillation. Left  ventricular diastolic function could not be evaluated.     LV Wall Scoring:  The mid and distal anterior wall, entire apex, mid and distal inferior  wall,  mid anterolateral segment, and mid inferoseptal segment are akinetic.   Right Ventricle: The right ventricular size is mildly enlarged. No  increase in right ventricular wall thickness. Right ventricular systolic  function is normal. There is moderately elevated pulmonary artery systolic  pressure. The tricuspid regurgitant  velocity is 3.34 m/s, and with an assumed right atrial pressure of 15  mmHg, the estimated right ventricular systolic pressure is 59.6 mmHg.   Left Atrium: Left atrial size was mildly dilated.   Right Atrium: Right atrial size was normal in size.   Pericardium: There is no evidence of  pericardial effusion.   Mitral Valve: The mitral valve is abnormal. Moderate to severe mitral  annular calcification. Mild to moderate mitral valve regurgitation. Mild  mitral valve stenosis. MV peak gradient, 9.2 mmHg. The mean mitral valve  gradient is 5.0 mmHg.   Tricuspid Valve: The tricuspid valve is not well visualized. Tricuspid  valve regurgitation is trivial. No evidence of tricuspid stenosis.   Aortic Valve: The aortic valve was not well visualized. There is severe  calcifcation of the aortic valve. There is severe thickening of the aortic  valve. Aortic valve regurgitation is not visualized. Moderate aortic  stenosis is present. Aortic valve mean   gradient measures 7.6 mmHg. Aortic valve peak gradient measures 14.5  mmHg. Aortic valve area, by VTI measures 0.67 cm.   Pulmonic Valve: The pulmonic valve was not well visualized. Pulmonic valve  regurgitation is not visualized. No evidence of pulmonic stenosis.   Aorta: The aortic root and ascending aorta are structurally normal, with  no evidence of dilitation.   Venous: The inferior vena cava is dilated in size with less than 50%  respiratory variability, suggesting right atrial pressure of 15 mmHg.   IAS/Shunts: No atrial level shunt detected by color flow Doppler.   08/20/23 Limited TTE  IMPRESSIONS  1. Left ventricular ejection fraction, by estimation, is 20 to 25%. The  left ventricle has severely decreased function. The left ventricle  demonstrates regional wall motion abnormalities (see scoring  diagram/findings for description).   2. Right ventricular systolic function is moderately reduced. The right  ventricular size is normal.   3. Left atrial size was mildly dilated.   Conclusion(s)/Recommendation(s): Limited echo to assess LV function. The  distal 2/3 of the LV are akientic. LVEF 20-25%.   FINDINGS   Left Ventricle: Left ventricular ejection fraction, by estimation, is 20  to 25%. The left  ventricle has severely decreased function. The left  ventricle demonstrates regional wall motion abnormalities. Definity  contrast agent was given IV to delineate  the left ventricular endocardial borders.     LV Wall Scoring:  The mid and distal anterior wall, mid and distal lateral wall, mid and  distal  anterior septum, mid and distal inferior wall, mid anterolateral segment,  and  mid inferoseptal segment are akinetic.   Right Ventricle: The right ventricular size is normal. Right ventricular  systolic function is moderately reduced.   Left Atrium: Left atrial size was mildly dilated.   Mitral Valve: Mild mitral annular calcification.   Laboratory Data:  High Sensitivity Troponin:   Recent Labs  Lab 08/17/23 2044 08/18/23 1536 08/18/23 1710 08/20/23 1555 08/20/23 1702  TROPONINIHS 558* 1,979* 2,079* 707* 624*     Chemistry Recent Labs  Lab 08/18/23 0311 08/19/23 0213 08/20/23 0431 08/20/23 0758 08/20/23 1555 08/21/23 0331  NA 136   < > 137  --  137 135  K 3.9   < > 2.6* 3.2* 3.9 3.7  CL 105   < > 104  --  104 102  CO2 20*   < > 21*  --  22 24  GLUCOSE 139*   < > 67*  --  189* 250*  BUN 67*   < > 59*  --  58* 62*  CREATININE 2.46*   < > 1.88*  --  1.86* 2.15*  CALCIUM 9.1   < > 8.8*  --  8.8* 8.3*  MG 1.6*  --  1.6*  --   --  2.2  GFRNONAA 26*   < > 36*  --  37* 31*  ANIONGAP 11   < > 12  --  11 9   < > = values in this interval not displayed.    Recent Labs  Lab 08/17/23 1437 08/20/23 0431  PROT 6.2* 5.6*  ALBUMIN 3.2* 2.8*  AST 30 28  ALT 28 44  ALKPHOS 51 43  BILITOT 1.2* 1.6*   Lipids No results for input(s): "CHOL", "TRIG", "HDL", "LABVLDL", "LDLCALC", "CHOLHDL" in the last 168 hours.  Hematology Recent Labs  Lab 08/17/23 1437 08/17/23 1939 08/19/23 0213 08/20/23 0431 08/21/23 0331  WBC 6.0  --   --  5.5 4.5  RBC 2.37*  --   --  2.95* 2.78*  HGB 7.5*   < > 9.2* 9.3* 8.7*  HCT 23.3*   < > 26.4* 27.1* 25.8*  MCV 98.3  --   --  91.9  92.8  MCH 31.6  --   --  31.5 31.3  MCHC 32.2  --   --  34.3 33.7  RDW 17.3*  --   --  17.5* 17.2*  PLT 141*  --   --  159 153   < > = values in this interval not displayed.   Thyroid No results for input(s): "TSH", "FREET4"  in the last 168 hours.  BNP Recent Labs  Lab 08/19/23 0213  BNP 1,228.1*    DDimer No results for input(s): "DDIMER" in the last 168 hours.   Radiology/Studies:  ECHOCARDIOGRAM LIMITED  Result Date: 08/20/2023    ECHOCARDIOGRAM LIMITED REPORT   Patient Name:   DUDLEY JOHNSTONE Date of Exam: 08/20/2023 Medical Rec #:  [id number]      Height:       68.0 in Accession #:    [id number]     Weight:       199.1 lb Date of Birth:  21-Apr-1946      BSA:          2.040 m Patient Age:    77 years       BP:           132/95 mmHg Patient Gender: M              HR:           118 bpm. Exam Location:  Inpatient Procedure: Limited Echo and Intracardiac Opacification Agent Indications:    NSTEMI  History:        Patient has prior history of Echocardiogram examinations, most                 recent 08/18/2023. Stroke, Arrythmias:Atrial Fibrillation,                 Signs/Symptoms:Hypotension and Syncope; Risk                 Factors:Dyslipidemia, Hypertension and Diabetes.  Sonographer:    Milda Smart Referring Phys: [id number] Lorin Glass  Sonographer Comments: Image acquisition challenging due to respiratory motion. Pt in BIPAP IMPRESSIONS  1. Left ventricular ejection fraction, by estimation, is 20 to 25%. The left ventricle has severely decreased function. The left ventricle demonstrates regional wall motion abnormalities (see scoring diagram/findings for description).  2. Right ventricular systolic function is moderately reduced. The right ventricular size is normal.  3. Left atrial size was mildly dilated. Conclusion(s)/Recommendation(s): Limited echo to assess LV function. The distal 2/3 of the LV are akientic. LVEF 20-25%. FINDINGS  Left Ventricle: Left ventricular ejection fraction, by  estimation, is 20 to 25%. The left ventricle has severely decreased function. The left ventricle demonstrates regional wall motion abnormalities. Definity contrast agent was given IV to delineate the left ventricular endocardial borders.  LV Wall Scoring: The mid and distal anterior wall, mid and distal lateral wall, mid and distal anterior septum, mid and distal inferior wall, mid anterolateral segment, and mid inferoseptal segment are akinetic. Right Ventricle: The right ventricular size is normal. Right ventricular systolic function is moderately reduced. Left Atrium: Left atrial size was mildly dilated. Mitral Valve: Mild mitral annular calcification. Arvilla Meres MD Electronically signed by Arvilla Meres MD Signature Date/Time: 08/20/2023/3:55:08 PM    Final    DG CHEST PORT 1 VIEW  Result Date: 08/20/2023 CLINICAL DATA:  Dyspnea EXAM: PORTABLE CHEST 1 VIEW COMPARISON:  X-ray 08/17/2023 and older FINDINGS: Stable enlarged cardiopericardial silhouette with widened mediastinum and calcified tortuous aorta. Again node made is small pleural effusions with vascular congestion and interstitial changes. Vascular calcifications. Degenerative changes along the spine. IMPRESSION: Increasing small pleural effusions. Electronically Signed   By: Karen Kays M.D.   On: 08/20/2023 15:49   ECHOCARDIOGRAM COMPLETE  Result Date: 08/18/2023    ECHOCARDIOGRAM REPORT   Patient Name:   ONNI BENNIS Date of Exam: 08/18/2023 Medical Rec #:  [id number]  Height:       68.0 in Accession #:    [id number]     Weight:       201.1 lb Date of Birth:  [birth year]      BSA:          2.048 m Patient Age:    77 years       BP:           101/69 mmHg Patient Gender: M              HR:           81 bpm. Exam Location:  Inpatient Procedure: 2D Echo, Cardiac Doppler, Color Doppler and Intracardiac            Opacification Agent Indications:    Dyspnea  History:        Patient has prior history of Echocardiogram examinations, most                  recent 04/05/2022. CAD, Arrythmias:Atrial Fibrillation,                 Signs/Symptoms:Hypotension; Risk Factors:Hypertension and                 Diabetes. NSTEMI.  Sonographer:    JLS Referring Phys: [id number] Clarene Critchley HOFFMAN IMPRESSIONS  1. Left ventricular ejection fraction, by estimation, is 30 to 35%. The left ventricle has moderately decreased function. The left ventricle demonstrates global hypokinesis. Left ventricular diastolic function could not be evaluated.  2. Right ventricular systolic function is normal. The right ventricular size is mildly enlarged. There is moderately elevated pulmonary artery systolic pressure. The estimated right ventricular systolic pressure is 59.6 mmHg.  3. Left atrial size was mildly dilated.  4. The mitral valve is abnormal. Mild to moderate mitral valve regurgitation. Mild mitral stenosis. The mean mitral valve gradient is 5.0 mmHg. Moderate to severe mitral annular calcification.  5. The aortic valve was not well visualized. There is severe calcifcation of the aortic valve. There is severe thickening of the aortic valve. Aortic valve regurgitation is not visualized. Moderate aortic valve stenosis. Aortic valve mean gradient measures 7.6 mmHg. Aortic valve Vmax measures 1.90 m/s.  6. The inferior vena cava is dilated in size with <50% respiratory variability, suggesting right atrial pressure of 15 mmHg. FINDINGS  Left Ventricle: Left ventricular ejection fraction, by estimation, is 30 to 35%. The left ventricle has moderately decreased function. The left ventricle demonstrates global hypokinesis. The left ventricular internal cavity size was normal in size. There is no left ventricular hypertrophy. Left ventricular diastolic function could not be evaluated due to atrial fibrillation. Left ventricular diastolic function could not be evaluated.  LV Wall Scoring: The mid and distal anterior wall, entire apex, mid and distal inferior wall, mid anterolateral segment, and  mid inferoseptal segment are akinetic. Right Ventricle: The right ventricular size is mildly enlarged. No increase in right ventricular wall thickness. Right ventricular systolic function is normal. There is moderately elevated pulmonary artery systolic pressure. The tricuspid regurgitant velocity is 3.34 m/s, and with an assumed right atrial pressure of 15 mmHg, the estimated right ventricular systolic pressure is 59.6 mmHg. Left Atrium: Left atrial size was mildly dilated. Right Atrium: Right atrial size was normal in size. Pericardium: There is no evidence of pericardial effusion. Mitral Valve: The mitral valve is abnormal. Moderate to severe mitral annular calcification. Mild to moderate mitral valve regurgitation. Mild mitral valve stenosis. MV peak gradient, 9.2 mmHg. The mean mitral  valve gradient is 5.0 mmHg. Tricuspid Valve: The tricuspid valve is not well visualized. Tricuspid valve regurgitation is trivial. No evidence of tricuspid stenosis. Aortic Valve: The aortic valve was not well visualized. There is severe calcifcation of the aortic valve. There is severe thickening of the aortic valve. Aortic valve regurgitation is not visualized. Moderate aortic stenosis is present. Aortic valve mean  gradient measures 7.6 mmHg. Aortic valve peak gradient measures 14.5 mmHg. Aortic valve area, by VTI measures 0.67 cm. Pulmonic Valve: The pulmonic valve was not well visualized. Pulmonic valve regurgitation is not visualized. No evidence of pulmonic stenosis. Aorta: The aortic root and ascending aorta are structurally normal, with no evidence of dilitation. Venous: The inferior vena cava is dilated in size with less than 50% respiratory variability, suggesting right atrial pressure of 15 mmHg. IAS/Shunts: No atrial level shunt detected by color flow Doppler.  LEFT VENTRICLE PLAX 2D LVIDd:         5.20 cm LVIDs:         3.50 cm LV PW:         0.80 cm LV IVS:        0.80 cm LVOT diam:     1.70 cm LV SV:         23 LV  SV Index:   11 LVOT Area:     2.27 cm  LV Volumes (MOD) LV vol d, MOD A2C: 125.0 ml LV vol d, MOD A4C: 178.0 ml LV vol s, MOD A2C: 71.9 ml LV vol s, MOD A4C: 103.0 ml LV SV MOD A2C:     53.1 ml LV SV MOD A4C:     178.0 ml LV SV MOD BP:      70.0 ml RIGHT VENTRICLE            IVC RV Basal diam:  4.10 cm    IVC diam: 2.20 cm RV Mid diam:    3.10 cm RV S prime:     8.70 cm/s TAPSE (M-mode): 1.8 cm LEFT ATRIUM             Index        RIGHT ATRIUM           Index LA Vol (A2C):   62.7 ml 30.61 ml/m  RA Area:     13.40 cm LA Vol (A4C):   82.4 ml 40.23 ml/m  RA Volume:   33.30 ml  16.26 ml/m LA Biplane Vol: 72.2 ml 35.25 ml/m  AORTIC VALVE                     PULMONIC VALVE AV Area (Vmax):    0.64 cm      PV Vmax:       0.68 m/s AV Area (Vmean):   0.66 cm      PV Peak grad:  1.9 mmHg AV Area (VTI):     0.67 cm AV Vmax:           190.40 cm/s AV Vmean:          127.000 cm/s AV VTI:            0.339 m AV Peak Grad:      14.5 mmHg AV Mean Grad:      7.6 mmHg LVOT Vmax:         53.60 cm/s LVOT Vmean:        37.100 cm/s LVOT VTI:          0.100 m LVOT/AV VTI ratio:  0.29  AORTA Ao Root diam: 3.40 cm Ao Asc diam:  3.50 cm MITRAL VALVE                  TRICUSPID VALVE MV Area (PHT): 4.19 cm       TR Peak grad:   44.6 mmHg MV Area VTI:   0.98 cm       TR Vmax:        334.00 cm/s MV Peak grad:  9.2 mmHg MV Mean grad:  5.0 mmHg       SHUNTS MV Vmax:       1.52 m/s       Systemic VTI:  0.10 m MV Vmean:      103.0 cm/s     Systemic Diam: 1.70 cm MV Decel Time: 181 msec MR Peak grad:    84.8 mmHg MR Mean grad:    55.3 mmHg MR Vmax:         460.33 cm/s MR Vmean:        352.7 cm/s MR PISA:         0.57 cm MR PISA Eff ROA: 10 mm MR PISA Radius:  0.30 cm MV E velocity: 117.00 cm/s Donato Schultz MD Electronically signed by Donato Schultz MD Signature Date/Time: 08/18/2023/2:31:12 PM    Final    DG CHEST PORT 1 VIEW  Result Date: 08/17/2023 CLINICAL DATA:  Shortness of breath EXAM: PORTABLE CHEST 1 VIEW COMPARISON:  Chest x-ray  08/17/2023 FINDINGS: The heart is enlarged. There are increased central interstitial markings bilaterally. There are additional patchy airspace opacities in the right mid lung. No pleural effusion or pneumothorax identified. No acute fractures are seen. IMPRESSION: 1. Cardiomegaly with increased central interstitial markings bilaterally, likely pulmonary edema. 2. Additional patchy airspace opacities in the right mid lung may represent atelectasis or pneumonia. Electronically Signed   By: Darliss Cheney M.D.   On: 08/17/2023 20:13   DG Chest Portable 1 View  Result Date: 08/17/2023 CLINICAL DATA:  GI bleed, hypotension EXAM: PORTABLE CHEST 1 VIEW COMPARISON:  05/27/2020 FINDINGS: Cardiomegaly. Diffuse bilateral interstitial pulmonary opacity. Chronic, callused fracture of the left clavicle. IMPRESSION: Cardiomegaly with diffuse bilateral interstitial pulmonary opacity, consistent with edema or atypical/viral infection. No focal airspace opacity. Electronically Signed   By: Jearld Lesch M.D.   On: 08/17/2023 15:31     Assessment and Plan:   Acute heart failure with reduced ejection fraction Elevated troponin  As noted above in HPI, patient with history of cardiomyopathy, reduced ejection fraction in 2021 in the [facility] of a stroke.  Suspected cardiomyopathy given wall motion abnormality pattern at that time.  Patient had recovery of ejection fraction on subsequent echocardiograms in the months following that admission.  Now admitted with progressive weakness over period of several weeks and orthostatic symptoms with significant hypotension on day of admission.  Patient has undergone echocardiogram x 2 this admission with reduced ejection fraction noted, as low as 20-25% with mid, distal anterior wall, mid and distal lateral wall, mid and distal anterior septum, mid and distal inferior wall, mid anterior lateral segment and mid inferoseptal segment akinesis. Troponin this admission 546 initially, peaked  2079, downtrended to 624. BNP 1228.1.  Patient remains borderline hypotensive today making optimization of guideline directed medical therapy challenging.  If patient has further recovery of blood pressure, would strongly support initiation of Entresto.  If unable to tolerate this due to lower blood pressures, would favor ARB over previous ACE inhibitor to facilitate possible future transition to  Entresto.  Patient would also be a good candidate for an SGLT2 upon stabilization of renal function.  Current creatinine clearance borderline for initiation of spironolactone and again blood pressure is currently a barrier to this. Patient currently receiving Coreg 12.5mg  twice daily (half of previous home dose). My personal review of echocardiograms, would agree that ejection fraction on limited echo from 11/27 shows very slightly worsened ejection fraction from 11/24 study.  History of suspected stress-induced cardiomyopathy, very likely that patient's current ejection fraction reduction is a recurrence of same with acute GI bleeding and anemia.  There could also be a component of tachycardia mediated cardiomyopathy with A-fib and RVR on presentation.  Overall suspicion for ACS is low but will discuss indication for ischemic evaluation with Dr. Excell Seltzer.  Patient not an ideal candidate for catheterization at this time with recent anemia as well as AKI on CKD.   Paroxysmal atrial fibrillation with RVR  Patient diagnosed with afib in the [facility] of CVA in 2021. Has had paroxysmal recurrence since. Maintained on Coreg and Eliquis PTA. On admission, noted in afib with RVR that improved with IVF and PRBC. Recurrent RVR yesterday with dyspnea following EGD and patient received 150mg  Amiodarone bolus before being switched to home Coreg at 1/2 dose.    Persistent atrial fibrillation with controlled ventricular rates on telemetry today.  Patient tolerating Coreg 12.5 mg twice daily but does have borderline low blood  pressure.  Echocardiogram show normal to mildly dilated atria so restoration of normal sinus rhythm likely possible if deemed necessary, however unclear if patient would maintain normal sinus rhythm given the paroxysmal atrial of his A-fib historically.  Patient has previously tolerated brief course of amiodarone, would consider utilizing if he has recurrent RVR. (Discussed Amiodarone further with Dr. Excell Seltzer, will start PO Amiodarone 200mg  BID with plan for 2 weeks at this dose before 200mg  daily. Ideally this would be a temporary course for the next 4-6 weeks). As per GI, okay to resume Eliquis.  Ordered by patient's primary team this morning.  Moderate aortic stenosis  TEE this admission with severe thickening of the aortic valve, moderate aortic stenosis with mean gradient of 7.6 mmHg, peak gradient of 14.5 mmHg with VTI of 0.67 cm.  2 out of 6 blowing systolic murmur on physical exam auscultated at right upper sternal border.  AKI on Chronic kidney disease stage III  With recent baseline creatinine around 1.4-1.6.  This admission with creatinine up to 2.46.  He had some improvement on 11/26, creatinine now back up to 2.15 today.  Risk Assessment/Risk Scores:        New York Heart Association (NYHA) Functional Class NYHA Class III  CHA2DS2-VASc Score = 8   This indicates a 10.8% annual risk of stroke. The patient's score is based upon: CHF History: 1 HTN History: 1 Diabetes History: 1 Stroke History: 2 Vascular Disease History: 1 Age Score: 2 Gender Score: 0         For questions or updates, please contact [facility] Please consult [email or web address] for contact info under    Signed, Perlie Gold, PA-C  08/21/2023 12:01 PM  Patient seen, examined. Available data reviewed. Agree with findings, assessment, and plan as outlined by Perlie Gold, PA-C.  The patient is independently interviewed and examined.  His sister and brother-in-law are at the bedside.  He is  alert, oriented, elderly male in no distress.  HEENT is normal, JVP is normal, lungs are clear bilaterally except for diminished breath sounds in the  bases, heart is irregularly irregular with a 2/6 systolic ejection murmur at the left upper sternal border, abdomen is soft and nontender, extremities have no edema, skin is warm and dry with no rash.  The patient presents with symptomatic hypotension in the [facility] of upper GI bleed.  On admission he is found to have hypoxemic respiratory failure requiring BiPAP, likely related to acute heart failure with reduced ejection fraction.  He also has suffered from acute kidney injury and has required blood transfusion to treat his anemia.  In reviewing all of the initial data, I suspect he got acutely volume overloaded because of necessary resuscitative efforts with IV fluids and blood transfusion.  He has severe segmental LV dysfunction which could be related to acute stress cardiomyopathy or underlying ischemic heart disease.  He did not have a very high troponin elevation and has had no anginal symptoms.  While he has significant risk for CAD, I do not think this is the time to further investigate for ischemic heart disease.  As outlined above, would treat him medically, but current medical therapy is limited by his low blood pressure.  Would continue carvedilol at the current dose which is 50% of his home dose.  Would hold ACE/ARB/Entresto/spironolactone in the context of acute on chronic kidney injury.  Regarding his atrial fibrillation, I think he would benefit from restoration of sinus rhythm.  He was just started back on apixaban.  Will treat him with oral amiodarone and if he remains in atrial fibrillation at outpatient follow-up, will arrange cardioversion.  Will load him with amiodarone 200 mg twice daily for at least 2 weeks, then treat him with 200 mg daily thereafter.  At this point in time, I would make sure that we maintain his perfusion pressure until we  see his creatinine trending back down to baseline.  Our team will follow with you.  Tonny Bollman, M.D. 08/21/2023 3:51 PM

## 2023-08-21 NOTE — Evaluation (Signed)
Physical Therapy Evaluation Patient Details Name: Connor Bryant MRN: [id number] DOB: 1946-03-26 Today's Date: 08/21/2023  History of Present Illness  77 y.o. male presents to [facility] hospital on 08/17/2023 with weakness, lightheadedness for 2 weeks, reporting a near-syncopal episode on the day of admission. PT admitted to ICU 2/2 hypotension, transferred to floor 11/24. Pt underwent EGD on 11/26, dyspneic after and transferred to ICU. Echo during admission with reduced EF to 20%. PMH includes OA, CVA, DMII, HTN, HLD.  Clinical Impression  Pt presents to PT with deficits in activity tolerance, strength, power, functional mobility, balance. Pt demonstrates generalized weakness and fatigues rather quickly when mobilizing, ambulating for short household distances with support of RW. PT encourages the pt to mobilize frequently with staff assistance in an effort to improve balance and activity tolerance. PT anticipates the pt will progress well and recommends discharge home with HHPT.        If plan is discharge home, recommend the following: A little help with bathing/dressing/bathroom;Assistance with cooking/housework;Assist for transportation;Help with stairs or ramp for entrance   Can travel by private vehicle        Equipment Recommendations BSC/3in1  Recommendations for Other Services       Functional Status Assessment Patient has had a recent decline in their functional status and demonstrates the ability to make significant improvements in function in a reasonable and predictable amount of time.     Precautions / Restrictions Precautions Precautions: Fall Restrictions Weight Bearing Restrictions: No      Mobility  Bed Mobility Overal bed mobility: Needs Assistance Bed Mobility: Supine to Sit, Sit to Supine     Supine to sit: Supervision Sit to supine: Contact guard assist        Transfers Overall transfer level: Needs assistance Equipment used: Rolling walker (2  wheels) Transfers: Sit to/from Stand Sit to Stand: Contact guard assist, From elevated surface                Ambulation/Gait Ambulation/Gait assistance: Contact guard assist Gait Distance (Feet): 20 Feet Assistive device: Rolling walker (2 wheels) Gait Pattern/deviations: Step-through pattern Gait velocity: reduced Gait velocity interpretation: <1.31 ft/sec, indicative of household ambulator   General Gait Details: slowed step-through gait, reduced stride length  Stairs            Wheelchair Mobility     Tilt Bed    Modified Rankin (Stroke Patients Only)       Balance Overall balance assessment: Needs assistance Sitting-balance support: No upper extremity supported, Feet supported Sitting balance-Leahy Scale: Good     Standing balance support: Bilateral upper extremity supported, Reliant on assistive device for balance Standing balance-Leahy Scale: Poor                               Pertinent Vitals/Pain Pain Assessment Pain Assessment: No/denies pain    Home Living Family/patient expects to be discharged to:: Private residence Living Arrangements: Alone Available Help at Discharge: Family;Friend(s);Available PRN/intermittently Type of Home: House Home Access: Stairs to enter Entrance Stairs-Rails: None Entrance Stairs-Number of Steps: 1   Home Layout: One level Home Equipment: Agricultural consultant (2 wheels);Cane - single point      Prior Function Prior Level of Function : Independent/Modified Independent;Driving             Mobility Comments: ambulatory without DME       Extremity/Trunk Assessment   Upper Extremity Assessment Upper Extremity Assessment: Overall WFL for tasks  assessed    Lower Extremity Assessment Lower Extremity Assessment: Generalized weakness    Cervical / Trunk Assessment Cervical / Trunk Assessment: Normal  Communication   Communication Communication: No apparent difficulties Cueing Techniques:  Verbal cues  Cognition Arousal: Alert Behavior During Therapy: WFL for tasks assessed/performed Overall Cognitive Status: Within Functional Limits for tasks assessed                                          General Comments General comments (skin integrity, edema, etc.): pt on 1L [place] upon PT arrival, PT weans pt to RA for mobility and sats remain 91% and above. PT places pt back on 1L [place] at end of session, as he reports he was recently desaturating at rest.    Exercises     Assessment/Plan    PT Assessment Patient needs continued PT services  PT Problem List Decreased strength;Decreased activity tolerance;Decreased balance;Decreased mobility;Decreased knowledge of use of DME;Cardiopulmonary status limiting activity       PT Treatment Interventions DME instruction;Gait training;Stair training;Functional mobility training;Therapeutic activities;Therapeutic exercise;Balance training;Neuromuscular re-education;Patient/family education    PT Goals (Current goals can be found in the Care Plan section)  Acute Rehab PT Goals Patient Stated Goal: to return to independence PT Goal Formulation: With patient Time For Goal Achievement: 09/04/23 Potential to Achieve Goals: Good    Frequency Min 1X/week     Co-evaluation               AM-PAC PT "6 Clicks" Mobility  Outcome Measure Help needed turning from your back to your side while in a flat bed without using bedrails?: A Little Help needed moving from lying on your back to sitting on the side of a flat bed without using bedrails?: A Little Help needed moving to and from a bed to a chair (including a wheelchair)?: A Little Help needed standing up from a chair using your arms (e.g., wheelchair or bedside chair)?: A Little Help needed to walk in hospital room?: A Little Help needed climbing 3-5 steps with a railing? : A Lot 6 Click Score: 17    End of Session Equipment Utilized During Treatment: Oxygen Activity  Tolerance: Patient tolerated treatment well Patient left: in bed;with call bell/phone within reach Nurse Communication: Mobility status PT Visit Diagnosis: Other abnormalities of gait and mobility (R26.89);Muscle weakness (generalized) (M62.81)    Time: 1541-1610 PT Time Calculation (min) (ACUTE ONLY): 29 min   Charges:   PT Evaluation $PT Eval Low Complexity: 1 Low   PT General Charges $$ ACUTE PT VISIT: 1 Visit         Arlyss Gandy, PT, DPT Acute Rehabilitation Office [phone]   Arlyss Gandy 08/21/2023, 4:23 PM

## 2023-08-21 NOTE — Plan of Care (Signed)
Problem: Activity: Goal: Risk for activity intolerance will decrease Outcome: Progressing   Problem: Nutrition: Goal: Adequate nutrition will be maintained Outcome: Progressing   Problem: Coping: Goal: Level of anxiety will decrease Outcome: Progressing   Problem: Elimination: Goal: Will not experience complications related to bowel motility Outcome: Progressing Goal: Will not experience complications related to urinary retention Outcome: Progressing   Problem: Pain Management: Goal: General experience of comfort will improve Outcome: Progressing   Problem: Safety: Goal: Ability to remain free from injury will improve Outcome: Progressing   Problem: Skin Integrity: Goal: Risk for impaired skin integrity will decrease Outcome: Progressing

## 2023-08-21 NOTE — Progress Notes (Signed)
Heart Failure Stewardship Pharmacist Progress Note   PCP: Mechele Claude, MD PCP-Cardiologist: Dina Rich, MD    HPI:  77 yo M with PMH of CHF, PVD, RA, T2DM, HTN, afib, PE, CAD, and CVA.  History of HFrEF with EF 35% in 04/2020 during admission for NSTEMI. Discharged on lasix, carvedilol, and hydralazine/imdur. Improved to 60-65% in 06/2020.  Presented to the ED on 11/23 with dizziness, lightheadedness, and hypotensive. Has been ongoing for 1 week. BP 58/43 on arrival. Given 1L fluid and improved to 80/53. Hgb 7.5 and FOBT positive. Admitted for hemorrhagic shock with acute GI bleed. S/p 2 units PRBC and Kcentra (on PTA Eliquis) and Hgb has been stable.   CXR with cardiomegaly and diffuse bilateral interstitial pulmonary opacity, consistent with edema vs infection. ECHO 11/24 with LVEF 30-35%, global hypokinesis, RV normal, mild to moderate MR.   Went for EGD on 11/26 and was complicated by severe dyspnea. CXR with increasing small pleural effusions. Received 60 mg IV lasix with improvement. Repeat ECHO with EF 20-25%, RWMA, RV moderately reduced.   Weaned down from 4L to 2L O2 recently per RN and is tolerating well. No edema on exam, denies shortness of breath, is able to lay flat. States that he felt agitated after waking up from anesthesia.   Current HF Medications: Diuretic: furosemide 40 mg IV x 1 Beta Blocker: carvedilol 12.5 mg BID  Prior to admission HF Medications: Beta blocker: carvedilol 25 mg BID ACE/ARB/ARNI: lisinopril 20 mg daily Other: hydralazine 100 mg BID + Imdur 30 mg daily  Pertinent Lab Values: Serum creatinine 2.14>1.88>2.15, BUN 62, Potassium 3.7, Sodium 135, BNP 228.1, Magnesium 2.2, A1c 6.8   Vital Signs: Weight: 206 lbs (admission weight: 201 lbs) Blood pressure: 90-100/60s Heart rate: 80-90s  I/O: net even yesterday  Medication Assistance / Insurance Benefits Check: Does the patient have prescription insurance?  Yes Type of insurance plan:  [facility] Medicare  Outpatient Pharmacy:  Prior to admission outpatient pharmacy: [facility] Is the patient willing to use [facility] TOC pharmacy at discharge? Yes Is the patient willing to transition their outpatient pharmacy to utilize a [facility] outpatient pharmacy?   No    Assessment: 1. Acute on chronic systolic CHF (LVEF 20-25%). NYHA class II symptoms. - S/p furosemide 40 mg IV x 1 today. Volume improving and weaning down from 4L to 2L O2. Denies shortness of breath and no LE edema. Strict I/Os and daily weights.  - Agree with restarting carvedilol at 12.5 mg BID (half PTA dose). BP is stable after dose this morning.  - GDMT limited with hypotension and AKI. Prefer to start Entresto vs resuming lisinopril if BP improves before discharge.  - If creatinine better tomorrow, start Farxiga 10 mg daily - Would stop amlodipine, chlorthalidone, hydralazine, and Imdur at discharge to allow for further optimization of GDMT   Plan: 1) Medication changes recommended at this time: - Start Farxiga 10 mg daily tomorrow if creatinine improved - Stop amlodipine, chlorthalidone, hydralazine, and Imdur at discharge  2) Patient assistance: - Currently in the coverage gap / donut hole - [employer] copay $153 in donut hole - Farxiga copay $146 in donut hole  3)  Education  - Patient has been educated on current HF medications and potential additions to HF medication regimen - Patient verbalizes understanding that over the next few months, these medication doses may change and more medications may be added to optimize HF regimen - Patient has been educated on basic disease state pathophysiology and  goals of therapy   Sharen Hones, PharmD, BCPS Heart Failure Stewardship Pharmacist Phone [phone]

## 2023-08-21 NOTE — Progress Notes (Signed)
PHARMACY - ANTICOAGULATION CONSULT NOTE  Pharmacy Consult for apixaban Indication: atrial fibrillation  No Known Allergies  Patient Measurements: Height: 5\' 8"  (172.7 cm) Weight: 93.7 kg (206 lb 9.1 oz) IBW/kg (Calculated) : 68.4   Vital Signs: Temp: 97.6 F (36.4 C) (11/27 0744) Temp Source: Oral (11/27 0744) BP: 100/67 (11/27 0730) Pulse Rate: 88 (11/27 0745)  Labs: Recent Labs    08/18/23 1710 08/18/23 2105 08/19/23 0213 08/20/23 0431 08/20/23 1555 08/20/23 1702 08/21/23 0331  HGB  --    < > 9.2* 9.3*  --   --  8.7*  HCT  --    < > 26.4* 27.1*  --   --  25.8*  PLT  --   --   --  159  --   --  153  CREATININE  --    < > 2.14* 1.88* 1.86*  --  2.15*  TROPONINIHS 2,079*  --   --   --  707* 624*  --    < > = values in this interval not displayed.    Estimated Creatinine Clearance: 31.4 mL/min (A) (by C-G formula based on SCr of 2.15 mg/dL (H)).   Medical History: Past Medical History:  Diagnosis Date   Acute CVA (cerebrovascular accident) (HCC) 05/01/2020   Acute respiratory failure with hypoxia (HCC) 05/04/2020   Arthritis    DM type 2 (diabetes mellitus, type 2) (HCC)    no meds   Endotracheally intubated    Gallstone    Heart murmur    was told one time he had a murmur, not since 30 years ago   HTN (hypertension)    Hyperlipidemia    Personal history of colonic polyp-adenoma 11/24/2013   Renal insufficiency    mild   Thoracic spine fracture (HCC)     Medications:  Medications Prior to Admission  Medication Sig Dispense Refill Last Dose   allopurinol (ZYLOPRIM) 100 MG tablet TAKE 1 TABLET 2 TO 3 TIMES A DAY FOR GOUT (Patient taking differently: Take 100 mg by mouth daily.) 90 tablet 0 08/17/2023   amLODipine (NORVASC) 5 MG tablet Take 1 tablet (5 mg total) by mouth daily. For blood pressure 90 tablet 3 08/17/2023   apixaban (ELIQUIS) 5 MG TABS tablet Take 1 tablet (5 mg total) by mouth 2 (two) times daily. 180 tablet 3 08/17/2023 at 900a    atorvastatin (LIPITOR) 80 MG tablet Take 1 tablet (80 mg total) by mouth daily. 90 tablet 3 08/17/2023   calcium carbonate (OSCAL) 1500 (600 Ca) MG TABS tablet Take 600 mg of elemental calcium by mouth daily with breakfast.   08/17/2023   carvedilol (COREG) 25 MG tablet TAKE ONE TABLET TWICE DAILY 180 tablet 2 08/17/2023 at 900a   chlorthalidone (HYGROTON) 25 MG tablet Take 25 mg by mouth every morning.   08/17/2023   ferrous sulfate 325 (65 FE) MG EC tablet Take 325 mg by mouth daily with breakfast.   08/17/2023   folic acid (FOLVITE) 1 MG tablet Take 1 tablet (1 mg total) by mouth daily. 90 tablet 3 08/17/2023   glimepiride (AMARYL) 2 MG tablet TAKE ONE TABLET ONCE DAILY EVERY MORNING (Patient taking differently: Take 2 mg by mouth daily with breakfast.) 90 tablet 3 08/17/2023   hydrALAZINE (APRESOLINE) 100 MG tablet Take 1 tablet (100 mg total) by mouth 3 (three) times daily. (Patient taking differently: Take 100 mg by mouth 2 (two) times daily.) 270 tablet 3 08/17/2023   isosorbide mononitrate (IMDUR) 30 MG 24 hr tablet  TAKE ONE TABLET BY MOUTH DAILY 90 tablet 3 08/17/2023   levocetirizine (XYZAL) 5 MG tablet TAKE ONE TABLET EVERY EVENING 90 tablet 1 unknown   lisinopril (ZESTRIL) 20 MG tablet Take 1 tablet (20 mg total) by mouth daily. 90 tablet 3 08/17/2023   methotrexate (RHEUMATREX) 2.5 MG tablet Take 4 tablets (10 mg total) by mouth once a week. Caution:Chemotherapy. Protect from light. (Patient taking differently: Take 5 mg by mouth once a week. Caution:Chemotherapy. Protect from light.) 20 tablet 0 08/02/2023   polyethylene glycol (MIRALAX / GLYCOLAX) packet Take 17 g by mouth daily. 30 packet 1 unknown   predniSONE (DELTASONE) 10 MG tablet Take 1 tablet (10 mg total) by mouth daily with breakfast. 30 tablet 2 08/17/2023   pyridOXINE (B-6) 50 MG tablet Take 1 tablet (50 mg total) by mouth daily. 90 tablet 1 08/17/2023   Blood Glucose Monitoring Suppl DEVI 1 each by Does not apply route in  the morning, at noon, and at bedtime. May substitute to any manufacturer covered by patient's insurance. 1 each 0     Assessment: 77 yo M presents with weakness and near syncope and hypotension with dark stools reported intermittently over a period of weeks. Hgb was 7.5 on admission. Apixaban was reversed with KCentra and 2u pRBC on 08/17/23. Pt diagnosed with GIB 2/2 erosive gastritis per EGD findings. Hgb stabilized at 8.7-9.3 over the past 48 hours. Pharmacy consulted to resume apixaban for Afib in accordance with GI recs.  Hgb 8.7, Plt 153 - no further bleeding noted  Goal of Therapy:  Monitor platelets by anticoagulation protocol: Yes   Plan:  Start apixaban 5mg  PO BID for Afib (age <80, weight > 60kg, Scr > 1.5) with history of stroke Monitor daily CBC and for s/sx of bleeding  Wilburn Cornelia, PharmD, BCPS Clinical Pharmacist 08/21/2023 9:24 AM   Please refer to [facility] for pharmacy phone number

## 2023-08-22 ENCOUNTER — Encounter (HOSPITAL_COMMUNITY): Payer: Self-pay | Admitting: Pulmonary Disease

## 2023-08-22 DIAGNOSIS — D649 Anemia, unspecified: Secondary | ICD-10-CM | POA: Diagnosis not present

## 2023-08-22 DIAGNOSIS — I5020 Unspecified systolic (congestive) heart failure: Secondary | ICD-10-CM | POA: Diagnosis not present

## 2023-08-22 DIAGNOSIS — K922 Gastrointestinal hemorrhage, unspecified: Secondary | ICD-10-CM | POA: Diagnosis not present

## 2023-08-22 DIAGNOSIS — N179 Acute kidney failure, unspecified: Secondary | ICD-10-CM | POA: Diagnosis not present

## 2023-08-22 LAB — COMPREHENSIVE METABOLIC PANEL
ALT: 40 U/L (ref 0–44)
AST: 23 U/L (ref 15–41)
Albumin: 3.1 g/dL — ABNORMAL LOW (ref 3.5–5.0)
Alkaline Phosphatase: 47 U/L (ref 38–126)
Anion gap: 13 (ref 5–15)
BUN: 63 mg/dL — ABNORMAL HIGH (ref 8–23)
CO2: 24 mmol/L (ref 22–32)
Calcium: 8.8 mg/dL — ABNORMAL LOW (ref 8.9–10.3)
Chloride: 101 mmol/L (ref 98–111)
Creatinine, Ser: 1.93 mg/dL — ABNORMAL HIGH (ref 0.61–1.24)
GFR, Estimated: 35 mL/min — ABNORMAL LOW (ref >60)
Glucose, Bld: 205 mg/dL — ABNORMAL HIGH (ref 70–99)
Potassium: 3.4 mmol/L — ABNORMAL LOW (ref 3.5–5.1)
Sodium: 138 mmol/L (ref 135–145)
Total Bilirubin: 1.2 mg/dL — ABNORMAL HIGH (ref <1.2)
Total Protein: 5.9 g/dL — ABNORMAL LOW (ref 6.5–8.1)

## 2023-08-22 LAB — CBC WITH DIFFERENTIAL/PLATELET
Abs Immature Granulocytes: 0.04 10*3/uL (ref 0.00–0.07)
Basophils Absolute: 0 10*3/uL (ref 0.0–0.1)
Basophils Relative: 0 %
Eosinophils Absolute: 0 10*3/uL (ref 0.0–0.5)
Eosinophils Relative: 0 %
HCT: 27.7 % — ABNORMAL LOW (ref 39.0–52.0)
Hemoglobin: 9.5 g/dL — ABNORMAL LOW (ref 13.0–17.0)
Immature Granulocytes: 1 %
Lymphocytes Relative: 11 %
Lymphs Abs: 0.8 10*3/uL (ref 0.7–4.0)
MCH: 31.7 pg (ref 26.0–34.0)
MCHC: 34.3 g/dL (ref 30.0–36.0)
MCV: 92.3 fL (ref 80.0–100.0)
Monocytes Absolute: 0.5 10*3/uL (ref 0.1–1.0)
Monocytes Relative: 7 %
Neutro Abs: 6.2 10*3/uL (ref 1.7–7.7)
Neutrophils Relative %: 81 %
Platelets: 172 10*3/uL (ref 150–400)
RBC: 3 MIL/uL — ABNORMAL LOW (ref 4.22–5.81)
RDW: 17.1 % — ABNORMAL HIGH (ref 11.5–15.5)
WBC: 7.6 10*3/uL (ref 4.0–10.5)
nRBC: 0 % (ref 0.0–0.2)

## 2023-08-22 LAB — GLUCOSE, CAPILLARY
Glucose-Capillary: 160 mg/dL — ABNORMAL HIGH (ref 70–99)
Glucose-Capillary: 176 mg/dL — ABNORMAL HIGH (ref 70–99)
Glucose-Capillary: 118 mg/dL — ABNORMAL HIGH (ref 70–99)
Glucose-Capillary: 130 mg/dL — ABNORMAL HIGH (ref 70–99)

## 2023-08-22 LAB — MAGNESIUM: Magnesium: 2.1 mg/dL (ref 1.7–2.4)

## 2023-08-22 LAB — PHOSPHORUS: Phosphorus: 2.7 mg/dL (ref 2.5–4.6)

## 2023-08-22 MED ORDER — POTASSIUM CHLORIDE CRYS ER 20 MEQ PO TBCR
40.0000 meq | EXTENDED_RELEASE_TABLET | Freq: Two times a day (BID) | ORAL | Status: AC
  Administered 2023-08-22 – 2023-08-23 (×2): 40 meq via ORAL
  Filled 2023-08-22 (×2): qty 2

## 2023-08-22 NOTE — Hospital Course (Addendum)
HPI per PCCM: 77 year old male with PMH as below, which is significant for RA recently started on methotrexate, prednisone, Atrial fibrillation and Eliquis, DM, HLD, and stroke. He presented to [facility] emergency department on 11/23 with complaints of weakness and lightheadedness starting 2 weeks prior to presentation. Symptoms really intensified about 1 week prior to arrival. On the day of presentation he experienced near syncope when rising to a standing position from sitting which prompted him to present to the emergency department via EMS. Upon EMS arrival systolic blood pressure was noted to be 58. He was infused with 1 L of normal saline and systolic blood pressure improved to the 80s. He has been taking his multiple antihypertensives as prescribed. Also described dark stools intermittently over period of weeks. Laboratory evaluation significant for hemoglobin 7.5 (which was 10.5 in August), INR 2.1, troponin elevated at 546, BUN 70, and creatinine 2.39. Despite 2 units of packed red blood cells and IV fluid resuscitation borderline hypotension remained and the patient was transferred to [facility] for ICU care with PCCM admitting.   Significant Hospital Events: Including procedures, antibiotic start and stop dates in addition to other pertinent events 11/23 admitted for hypotension secondary to acute blood loss anemia and GI bleed 11/24 transferred out of ICU to [facility] for 11/25 pickup 11/26 EGD with findings of salmon colored mucosa of esophagus, gastritis, nodular mucosa in duodenal bulb. Dyspneic after EGD. Received 20mg  Lasix and put on BiPAP in PACU. PCCM consulted.  **Interim History Transferred to [facility] service on 08/22/23. He is slowly improving. PT currently recommending Home Health PT. Will need to Wean O2 further  Assessment and Plan:  Acute Respiratory Failure with Hypoxia in the 2/2 of Volume Overload and Pulmonary Edema from Acute CHF requiring noninvasive positive pressure  ventilation with BiPAP -Improving and now off of BiPAP SpO2: 96 % O2 Flow Rate (L/min): 2 L/min FiO2 (%): 40 % -Continuous pulse oximetry maintain O2 saturation greater 92% -Continue supplemental oxygen via nasal cannula wean O2 as tolerated -Continue with Xopenex 0.63 mg nebs every 3 hours as needed for wheezing and shortness of breath -Will need an ambulatory O2 screen prior to discharge and repeat chest x-ray in the a.m.  Acute Systolic CHF with Reduced EF of 20-25% Elevated Troponin and Demand Ischemia with Hx of Cardiomyopathy -BNP was 1228.1 and troponin peaked at 2079 and cardiology is currently following -Strict I's and O's and Daily  Intake/Output Summary (Last 24 hours) at 08/23/2023 0938 Last data filed at 08/23/2023 0546 Gross per 24 hour  Intake 240 ml  Output 1650 ml  Net -1410 ml  -Repeat echocardiogram x 2 this admission showed a reduced EF of 20 to 25% with mid and distal anterior wall and mid and distal lateral wall and mid and distal anteroseptum and mid and distal inferior wall and mid anterior lateral segment and mid inferior septal segment akinesis -Cardiology attempting GDMT and he is already on carvedilol now.  Has not received his ACE inhibitor since 08/17/2023 -Marcelline Deist was never started but will hold given his renal function -Currently on carvedilol 12.5 mg p.o. twice daily -Cardiology feels that this is low suspicion for ACS and that his elevated troponins are related as tachycardia mediated cardiomyopathy -They feel he is not a candidate for cardiac catheterization with his Renal Fxn   PAF with RVR -Was on IV Amiodarone and now on po Amiodarone Taper with 200 mg po BID x 14 days and then po Amiodarone 200 mg po Daily  starting 200 mg Daily  -Given that is okay to resume anticoagulation per GI his anticoagulation has been resumed with Apixaban  Moderate Aortic Stenosis -Had TTE this Admission which significant aortic valve as well as moderate aortic stenosis  with a mean gradient of 7.6 mm.  Peak gradient 14.5 mmHg with a VTI of 0.6 cm  -Highly auscultated murmur on physical examination -Further care per cardiology  AKI on CKD Stage 3a -Has a baseline Cr of 1.4-1.6 -BUN/Cr Trend: Recent Labs  Lab 08/18/23 0311 08/19/23 0213 08/20/23 0431 08/20/23 1555 08/21/23 0331 08/22/23 0839 08/23/23 0204  BUN 67* 67* 59* 58* 62* 63* 58*  CREATININE 2.46* 2.14* 1.88* 1.86* 2.15* 1.93* 1.80*  -Avoid Nephrotoxic Medications, Contrast Dyes, Hypotension and Dehydration to Ensure Adequate Renal Perfusion and will need to Renally Adjust Meds -Continue to Monitor and Trend Renal Function carefully and repeat CMP in the AM   GI Bleeding 2/2 Erosive Gastritis exacerbated by Anticoagulation with Apixaban, Prednisone Use and EtOH consumption Normocytic Anemia -Hgb/Hct Trend: Recent Labs  Lab 08/18/23 1404 08/18/23 2105 08/19/23 0213 08/20/23 0431 08/21/23 0331 08/22/23 0626 08/23/23 0204  HGB 10.0* 9.1* 9.2* 9.3* 8.7* 9.5* 9.0*  HCT 29.5* 26.6* 26.4* 27.1* 25.8* 27.7* 27.3*  MCV  --   --   --  91.9 92.8 92.3 92.2  -Iron Panel done and showed an Iron level of 40, UIBC of 206, TIBC of 246, Saturation Ratios of 16%, and Ferritin Level of 369 -S/p Iron Transfusion with Venofer -Continue to Monitor for S/Sx of Bleeding -C/w PPI BID with Pantoprazole 40 mg po BID -Repeat CBC in the AM  Chronic Gout -C/w Allopurinol 100 mg po Daily  Hypokalemia -Patient's K+ Level Trend: Recent Labs  Lab 08/19/23 0213 08/20/23 0431 08/20/23 0758 08/20/23 1555 08/21/23 0331 08/22/23 0839 08/23/23 0204  K 3.4* 2.6* 3.2* 3.9 3.7 3.4* 3.5  -Replete with po Kcl 40 mEQ BID x2 -Continue to Monitor and Replete as Necessary -Repeat CMP in the AM   Hyperbilirubinemia -T Bili trend: Recent Labs  Lab 08/17/23 1437 08/20/23 0431 08/22/23 0839 08/23/23 0204  BILITOT 1.2* 1.6* 1.2* 1.1  -Continue to Monitor and Trend and Repeat CMP in the  AM  Hypoalbuminemia -Patient's Albumin Trend: Recent Labs  Lab 08/17/23 1437 08/20/23 0431 08/22/23 0839 08/23/23 0204  ALBUMIN 3.2* 2.8* 3.1* 2.8*  -Continue to Monitor and Trend and repeat CMP in the AM  Obesity -Complicates overall prognosis and care -Estimated body mass index is 29.9 kg/m as calculated from the following:   Height as of this encounter: 5\' 8"  (1.727 m).   Weight as of this encounter: 89.2 kg.  -Weight Loss and Dietary Counseling given

## 2023-08-22 NOTE — Progress Notes (Signed)
PROGRESS NOTE    Connor Bryant  NFA:[id number] DOB: 06/18/1946 DOA: 08/17/2023 PCP: Mechele Claude, MD   Brief Narrative:  HPI per PCCM: 77 year old male with PMH as below, which is significant for RA recently started on methotrexate, prednisone, Atrial fibrillation and Eliquis, DM, HLD, and stroke. He presented to [facility] emergency department on 11/23 with complaints of weakness and lightheadedness starting 2 weeks prior to presentation. Symptoms really intensified about 1 week prior to arrival. On the day of presentation he experienced near syncope when rising to a standing position from sitting which prompted him to present to the emergency department via EMS. Upon EMS arrival systolic blood pressure was noted to be 58. He was infused with 1 L of normal saline and systolic blood pressure improved to the 80s. He has been taking his multiple antihypertensives as prescribed. Also described dark stools intermittently over period of weeks. Laboratory evaluation significant for hemoglobin 7.5 (which was 10.5 in August), INR 2.1, troponin elevated at 546, BUN 70, and creatinine 2.39. Despite 2 units of packed red blood cells and IV fluid resuscitation borderline hypotension remained and the patient was transferred to [facility] for ICU care with PCCM admitting.   Significant Hospital Events: Including procedures, antibiotic start and stop dates in addition to other pertinent events 11/23 admitted for hypotension secondary to acute blood loss anemia and GI bleed 11/24 transferred out of ICU to [facility] for 11/25 pickup 11/26 EGD with findings of salmon colored mucosa of esophagus, gastritis, nodular mucosa in duodenal bulb. Dyspneic after EGD. Received 20mg  Lasix and put on BiPAP in PACU. PCCM consulted.  **Interim History Transferred to [facility] service on 08/22/23. He is slowly improving. PT currently recommending Home Health PT. Will need to Wean O2 further  Assessment and Plan:  Acute Respiratory  Failure with Hypoxia in the 2/2 of Volume Overload and Pulmonary Edema from Acute CHF requiring noninvasive positive pressure ventilation with BiPAP -Improving and now off of BiPAP SpO2: 96 % O2 Flow Rate (L/min): 2 L/min FiO2 (%): 40 % -Continuous pulse oximetry maintain O2 saturation greater 92% -Continue supplemental oxygen via nasal cannula wean O2 as tolerated -Continue with Xopenex 0.63 mg nebs every 3 hours as needed for wheezing and shortness of breath -Will need an ambulatory O2 screen prior to discharge and repeat chest x-ray in the a.m.  Acute Systolic CHF with Reduced EF of 20-25% Elevated Troponin and Demand Ischemia with Hx of Cardiomyopathy -BNP was 1228.1 and troponin peaked at 2079 and cardiology is currently following -Strict I's and O's and Daily  Intake/Output Summary (Last 24 hours) at 08/22/2023 1841 Last data filed at 08/22/2023 1620 Gross per 24 hour  Intake 240 ml  Output 1050 ml  Net -810 ml  -Repeat echocardiogram x 2 this admission showed a reduced EF of 20 to 25% with mid and distal anterior wall and mid and distal lateral wall and mid and distal anteroseptum and mid and distal inferior wall and mid anterior lateral segment and mid inferior septal segment akinesis -Cardiology attempting GDMT and he is already on carvedilol now.  Has not received his ACE inhibitor since 08/17/2023 -Marcelline Deist was never started but will hold given his renal function -Currently on carvedilol 12.5 mg p.o. twice daily -Cardiology feels that this is low suspicion for ACS and that his elevated troponins are related as tachycardia mediated cardiomyopathy -They feel he is not a candidate for cardiac catheterization with his Renal Fxn   PAF with RVR -Was on IV  Amiodarone and now on po Amiodarone Taper with 200 mg po BID x 14 days and then po Amiodarone 200 mg po Daily starting 200 mg Daily  -Given that is okay to resume anticoagulation per GI his anticoagulation has been resumed with  Apixaban  Moderate Aortic Stenosis -Had TTE this Admission which significant aortic valve as well as moderate aortic stenosis with a mean gradient of 7.6 mm.  Peak gradient 14.5 mmHg with a VTI of 0.6 cm  -Highly auscultated murmur on physical examination -Further care per cardiology  AKI on CKD Stage 3a -Has a baseline Cr of 1.4-1.6 -BUN/Cr Trend: Recent Labs  Lab 08/17/23 1437 08/18/23 0311 08/19/23 0213 08/20/23 0431 08/20/23 1555 08/21/23 0331 08/22/23 0839  BUN 70* 67* 67* 59* 58* 62* 63*  CREATININE 2.39* 2.46* 2.14* 1.88* 1.86* 2.15* 1.93*  -Avoid Nephrotoxic Medications, Contrast Dyes, Hypotension and Dehydration to Ensure Adequate Renal Perfusion and will need to Renally Adjust Meds -Continue to Monitor and Trend Renal Function carefully and repeat CMP in the AM   GI Bleeding 2/2 Erosive Gastritis exacerbated by Anticoagulation with Apixaban, Prednisone Use and EtOH consumption Normocytic Anemia -Hgb/Hct Trend: Recent Labs  Lab 08/18/23 0311 08/18/23 1404 08/18/23 2105 08/19/23 0213 08/20/23 0431 08/21/23 0331 08/22/23 0626  HGB 9.2* 10.0* 9.1* 9.2* 9.3* 8.7* 9.5*  HCT 26.8* 29.5* 26.6* 26.4* 27.1* 25.8* 27.7*  MCV  --   --   --   --  91.9 92.8 92.3  -Iron Panel done and showed an Iron level of 40, UIBC of 206, TIBC of 246, Saturation Ratios of 16%, and Ferritin Level of 369 -S/p Iron Transfusion with Venofer -Continue to Monitor for S/Sx of Bleeding -C/w PPI BID with Pantoprazole 40 mg po BID -Repeat CBC in the AM  Chronic Gout -C/w Allopurinol 100 mg po Daily  Hypokalemia -Patient's K+ Level Trend: Recent Labs  Lab 08/18/23 0311 08/19/23 0213 08/20/23 0431 08/20/23 0758 08/20/23 1555 08/21/23 0331 08/22/23 0839  K 3.9 3.4* 2.6* 3.2* 3.9 3.7 3.4*  -Replete with po Kcl 40 mEQ BID x2 -Continue to Monitor and Replete as Necessary -Repeat CMP in the AM   Hyperbilirubinemia -T Bili trend: Recent Labs  Lab 08/17/23 1437 08/20/23 0431  08/22/23 0839  BILITOT 1.2* 1.6* 1.2*  -Continue to Monitor and Trend and Repeat CMP in the AM  Hypoalbuminemia -Patient's Albumin Trend: Recent Labs  Lab 08/17/23 1437 08/20/23 0431 08/22/23 0839  ALBUMIN 3.2* 2.8* 3.1*  -Continue to Monitor and Trend and repeat CMP in the AM  Obesity -Complicates overall prognosis and care -Estimated body mass index is 30.44 kg/m as calculated from the following:   Height as of this encounter: 5\' 8"  (1.727 m).   Weight as of this encounter: 90.8 kg.  -Weight Loss and Dietary Counseling given   DVT prophylaxis: SCDs Start: 08/17/23 1921 apixaban (ELIQUIS) tablet 5 mg    Code Status: Full Code Family Communication: No family  present at bedside  Disposition Plan:  Level of care: Telemetry Cardiac Status is: Inpatient Remains inpatient appropriate because: Further clinical improvement and weaning of his oxygen   Consultants:  PCCM Transfer Gastroenterology Cardiology  Procedures:  LIMITED ECHOCARDIOGRAM IMPRESSIONS     1. Left ventricular ejection fraction, by estimation, is 20 to 25%. The  left ventricle has severely decreased function. The left ventricle  demonstrates regional wall motion abnormalities (see scoring  diagram/findings for description).   2. Right ventricular systolic function is moderately reduced. The right  ventricular size is normal.  3. Left atrial size was mildly dilated.   Conclusion(s)/Recommendation(s): Limited echo to assess LV function. The  distal 2/3 of the LV are akientic. LVEF 20-25%.   FINDINGS   Left Ventricle: Left ventricular ejection fraction, by estimation, is 20  to 25%. The left ventricle has severely decreased function. The left  ventricle demonstrates regional wall motion abnormalities. Definity  contrast agent was given IV to delineate  the left ventricular endocardial borders.     LV Wall Scoring:  The mid and distal anterior wall, mid and distal lateral wall, mid and  distal   anterior septum, mid and distal inferior wall, mid anterolateral segment,  and  mid inferoseptal segment are akinetic.   Right Ventricle: The right ventricular size is normal. Right ventricular  systolic function is moderately reduced.   Left Atrium: Left atrial size was mildly dilated.   Mitral Valve: Mild mitral annular calcification.   Antimicrobials:  Anti-infectives (From admission, onward)    None       Subjective: Seen and examined at bedside thinks his breathing is doing better.  No nausea or vomiting.  Denies any lightheadedness or dizziness.  Feels okay.  Objective: Vitals:   08/22/23 0500 08/22/23 0715 08/22/23 1125 08/22/23 1619  BP: (!) 114/91 104/80 100/76 110/84  Pulse: 96   96  Resp: 14   16  Temp:  97.6 F (36.4 C) 97.6 F (36.4 C) 97.9 F (36.6 C)  TempSrc:  Oral Oral Oral  SpO2: 96%   96%  Weight: 90.8 kg     Height:        Intake/Output Summary (Last 24 hours) at 08/22/2023 2130 Last data filed at 08/22/2023 1620 Gross per 24 hour  Intake 240 ml  Output 1050 ml  Net -810 ml   Filed Weights   08/20/23 1519 08/21/23 0352 08/22/23 0500  Weight: 90.3 kg 93.7 kg 90.8 kg   Examination: Physical Exam:  Constitutional: WN/WD obese elderly chronically ill-appearing Caucasian male in no acute distress Respiratory: Diminished to auscultation bilaterally with some coarse breath sounds, no wheezing, rales, rhonchi or crackles. Normal respiratory effort and patient is not tachypenic. No accessory muscle use.  Wearing supplemental oxygen via nasal cannula Cardiovascular: RRR, no murmurs / rubs / gallops. S1 and S2 auscultated.  Trace extremity edema Abdomen: Soft, non-tender, distended secondary to body habitus.  Bowel sounds positive.  GU: Deferred. Musculoskeletal: No clubbing / cyanosis of digits/nails. No joint deformity upper and lower extremities. Skin: No rashes, lesions, ulcers on limited skin evaluation. No induration; Warm and dry.   Neurologic: CN 2-12 grossly intact with no focal deficits.  Romberg sign and cerebellar reflexes not assessed.  Psychiatric: Normal judgment and insight. Alert and oriented x 3. Normal mood and appropriate affect.   Data Reviewed: I have personally reviewed following labs and imaging studies  CBC: Recent Labs  Lab 08/17/23 1437 08/17/23 1939 08/18/23 2105 08/19/23 0213 08/20/23 0431 08/21/23 0331 08/22/23 0626  WBC 6.0  --   --   --  5.5 4.5 7.6  NEUTROABS  --   --   --   --   --   --  6.2  HGB 7.5*   < > 9.1* 9.2* 9.3* 8.7* 9.5*  HCT 23.3*   < > 26.6* 26.4* 27.1* 25.8* 27.7*  MCV 98.3  --   --   --  91.9 92.8 92.3  PLT 141*  --   --   --  159 153 172   < > = values  in this interval not displayed.   Basic Metabolic Panel: Recent Labs  Lab 08/18/23 0311 08/19/23 0213 08/20/23 0431 08/20/23 0758 08/20/23 1555 08/21/23 0331 08/22/23 0839  NA 136 138 137  --  137 135 138  K 3.9 3.4* 2.6* 3.2* 3.9 3.7 3.4*  CL 105 106 104  --  104 102 101  CO2 20* 22 21*  --  22 24 24   GLUCOSE 139* 144* 67*  --  189* 250* 205*  BUN 67* 67* 59*  --  58* 62* 63*  CREATININE 2.46* 2.14* 1.88*  --  1.86* 2.15* 1.93*  CALCIUM 9.1 9.0 8.8*  --  8.8* 8.3* 8.8*  MG 1.6*  --  1.6*  --   --  2.2 2.1  PHOS 5.1*  --   --   --  3.2 3.0 2.7   GFR: Estimated Creatinine Clearance: 34.5 mL/min (A) (by C-G formula based on SCr of 1.93 mg/dL (H)). Liver Function Tests: Recent Labs  Lab 08/17/23 1437 08/20/23 0431 08/22/23 0839  AST 30 28 23   ALT 28 44 40  ALKPHOS 51 43 47  BILITOT 1.2* 1.6* 1.2*  PROT 6.2* 5.6* 5.9*  ALBUMIN 3.2* 2.8* 3.1*   No results for input(s): "LIPASE", "AMYLASE" in the last 168 hours. No results for input(s): "AMMONIA" in the last 168 hours. Coagulation Profile: Recent Labs  Lab 08/17/23 1437 08/18/23 0311  INR 2.1* 1.7*   Cardiac Enzymes: No results for input(s): "CKTOTAL", "CKMB", "CKMBINDEX", "TROPONINI" in the last 168 hours. BNP (last 3 results) No results  for input(s): "PROBNP" in the last 8760 hours. HbA1C: Recent Labs    08/20/23 0431  HGBA1C 6.6*   CBG: Recent Labs  Lab 08/21/23 1526 08/21/23 2109 08/22/23 0600 08/22/23 1124 08/22/23 1643  GLUCAP 253* 209* 160* 176* 118*   Lipid Profile: No results for input(s): "CHOL", "HDL", "LDLCALC", "TRIG", "CHOLHDL", "LDLDIRECT" in the last 72 hours. Thyroid Function Tests: No results for input(s): "TSH", "T4TOTAL", "FREET4", "T3FREE", "THYROIDAB" in the last 72 hours. Anemia Panel: Recent Labs    08/20/23 1555  FERRITIN 369*  TIBC 246*  IRON 40*   Sepsis Labs: Recent Labs  Lab 08/17/23 1621  LATICACIDVEN 1.7   Recent Results (from the past 240 hour(s))  MRSA Next Gen by PCR, Nasal     Status: None   Collection Time: 08/17/23  6:56 PM   Specimen: Nasal Mucosa; Nasal Swab  Result Value Ref Range Status   MRSA by PCR Next Gen NOT DETECTED NOT DETECTED Final    Comment: (NOTE) The GeneXpert MRSA Assay (FDA approved for NASAL specimens only), is one component of a comprehensive MRSA colonization surveillance program. It is not intended to diagnose MRSA infection nor to guide or monitor treatment for MRSA infections. Test performance is not FDA approved in patients less than 71 years old. Performed at [facility], [address], [place], [place] [address]     Radiology Studies: No results found.  Scheduled Meds:  allopurinol  100 mg Oral Daily   amiodarone  200 mg Oral BID   [START ON 09/04/2023] amiodarone  200 mg Oral Daily   apixaban  5 mg Oral BID   carvedilol  12.5 mg Oral BID WC   feeding supplement  1 Container Oral TID BM   folic acid  1 mg Oral Daily   insulin aspart  0-9 Units Subcutaneous TID AC & HS   pantoprazole  40 mg Oral BID   potassium chloride  40  mEq Oral BID   Continuous Infusions:   LOS: 5 days   Marguerita Merles, DO [facility] Available via Epic secure chat 7am-7pm After these hours, please refer to coverage provider listed  on [email or web address] 08/22/2023, 6:52 PM

## 2023-08-22 NOTE — Progress Notes (Signed)
  Patient Name: Connor Bryant Date of Encounter: 08/22/2023 [facility] Cardiologist: Dina Rich, MD   Interval Summary  .    Feels fine. No am labs to review.   Vital Signs .    Vitals:   08/21/23 1900 08/22/23 0000 08/22/23 0400 08/22/23 0500  BP: 100/60 (!) 102/57 97/64 (!) 114/91  Pulse: 88 100 97 96  Resp: 18 18 14 14   Temp: 97.6 F (36.4 C) 97.8 F (36.6 C) 98 F (36.7 C)   TempSrc: Oral Oral Oral   SpO2: 94% 95% 98% 96%  Weight:    90.8 kg  Height:        Intake/Output Summary (Last 24 hours) at 08/22/2023 0711 Last data filed at 08/21/2023 2110 Gross per 24 hour  Intake 730.5 ml  Output 1000 ml  Net -269.5 ml      08/22/2023    5:00 AM 08/21/2023    3:52 AM 08/20/2023    3:19 PM  Last 3 Weights  Weight (lbs) 200 lb 2.8 oz 206 lb 9.1 oz 199 lb 1.2 oz  Weight (kg) 90.8 kg 93.7 kg 90.3 kg      Telemetry/ECG    Afib 100-110 - Personally Reviewed  Physical Exam .   GEN: No acute distress.   Neck: No JVD Cardiac: iRRR, no murmurs, rubs, or gallops.  Respiratory: Clear to auscultation bilaterally. GI: Soft, nontender, non-distended  MS: No edema  Assessment & Plan .     Principal Problem:   GI bleed Active Problems:   GIB (gastrointestinal bleeding)   Hypotension   AKI (acute kidney injury) (HCC)   Respiratory distress   Acute on chronic anemia   Symptomatic anemia   On apixaban therapy   Paroxysmal atrial fibrillation (HCC)   HFrEF (heart failure with reduced ejection fraction) (HCC)  Connor Bryant is a 77 y.o. male with a hx of peripheral artery disease status post prior mesenteric artery bypass and bilateral iliac stenting, carotid stenosis, hyperlipidemia, CKD, atrial fibrillation, aortic stenosis, pulmonary embolism, CVA, anemia, hypertension, DM, rheumatoid arthritis who is being seen for the evaluation of abnormal echocardiogram and for GDMT recommendations. - continue carvedilol at current dose, BP borderline.  -  obtain daily BMP, for now no new medication recommendations as Cr was rising on prior day labs.    For questions or updates, please contact [facility] Please consult [email or web address] for contact info under        Signed, Parke Poisson, MD

## 2023-08-22 NOTE — Plan of Care (Signed)
  Problem: Health Behavior/Discharge Planning: Goal: Ability to manage health-related needs will improve Outcome: Progressing   Problem: Clinical Measurements: Goal: Ability to maintain clinical measurements within normal limits will improve Outcome: Progressing   Problem: Clinical Measurements: Goal: Will remain free from infection Outcome: Progressing   Problem: Clinical Measurements: Goal: Diagnostic test results will improve Outcome: Progressing   

## 2023-08-23 ENCOUNTER — Inpatient Hospital Stay (HOSPITAL_COMMUNITY): Payer: Medicare Other

## 2023-08-23 ENCOUNTER — Encounter (HOSPITAL_COMMUNITY): Payer: Self-pay | Admitting: Internal Medicine

## 2023-08-23 ENCOUNTER — Other Ambulatory Visit (HOSPITAL_COMMUNITY): Payer: Self-pay

## 2023-08-23 DIAGNOSIS — R0603 Acute respiratory distress: Secondary | ICD-10-CM

## 2023-08-23 DIAGNOSIS — I5020 Unspecified systolic (congestive) heart failure: Secondary | ICD-10-CM | POA: Diagnosis not present

## 2023-08-23 DIAGNOSIS — D649 Anemia, unspecified: Secondary | ICD-10-CM | POA: Diagnosis not present

## 2023-08-23 DIAGNOSIS — K922 Gastrointestinal hemorrhage, unspecified: Secondary | ICD-10-CM | POA: Diagnosis not present

## 2023-08-23 DIAGNOSIS — Z7901 Long term (current) use of anticoagulants: Secondary | ICD-10-CM | POA: Diagnosis not present

## 2023-08-23 LAB — CBC WITH DIFFERENTIAL/PLATELET
Abs Immature Granulocytes: 0.1 10*3/uL — ABNORMAL HIGH (ref 0.00–0.07)
Basophils Absolute: 0 10*3/uL (ref 0.0–0.1)
Basophils Relative: 0 %
Eosinophils Absolute: 0.1 10*3/uL (ref 0.0–0.5)
Eosinophils Relative: 1 %
HCT: 27.3 % — ABNORMAL LOW (ref 39.0–52.0)
Hemoglobin: 9 g/dL — ABNORMAL LOW (ref 13.0–17.0)
Immature Granulocytes: 1 %
Lymphocytes Relative: 18 %
Lymphs Abs: 1.3 10*3/uL (ref 0.7–4.0)
MCH: 30.4 pg (ref 26.0–34.0)
MCHC: 33 g/dL (ref 30.0–36.0)
MCV: 92.2 fL (ref 80.0–100.0)
Monocytes Absolute: 0.5 10*3/uL (ref 0.1–1.0)
Monocytes Relative: 7 %
Neutro Abs: 5.2 10*3/uL (ref 1.7–7.7)
Neutrophils Relative %: 73 %
Platelets: 171 10*3/uL (ref 150–400)
RBC: 2.96 MIL/uL — ABNORMAL LOW (ref 4.22–5.81)
RDW: 17.1 % — ABNORMAL HIGH (ref 11.5–15.5)
WBC: 7.1 10*3/uL (ref 4.0–10.5)
nRBC: 0.4 % — ABNORMAL HIGH (ref 0.0–0.2)

## 2023-08-23 LAB — COMPREHENSIVE METABOLIC PANEL
ALT: 38 U/L (ref 0–44)
AST: 21 U/L (ref 15–41)
Albumin: 2.8 g/dL — ABNORMAL LOW (ref 3.5–5.0)
Alkaline Phosphatase: 41 U/L (ref 38–126)
Anion gap: 9 (ref 5–15)
BUN: 58 mg/dL — ABNORMAL HIGH (ref 8–23)
CO2: 25 mmol/L (ref 22–32)
Calcium: 8.2 mg/dL — ABNORMAL LOW (ref 8.9–10.3)
Chloride: 105 mmol/L (ref 98–111)
Creatinine, Ser: 1.8 mg/dL — ABNORMAL HIGH (ref 0.61–1.24)
GFR, Estimated: 38 mL/min — ABNORMAL LOW (ref >60)
Glucose, Bld: 90 mg/dL (ref 70–99)
Potassium: 3.5 mmol/L (ref 3.5–5.1)
Sodium: 139 mmol/L (ref 135–145)
Total Bilirubin: 1.1 mg/dL (ref <1.2)
Total Protein: 5.3 g/dL — ABNORMAL LOW (ref 6.5–8.1)

## 2023-08-23 LAB — GLUCOSE, CAPILLARY
Glucose-Capillary: 85 mg/dL (ref 70–99)
Glucose-Capillary: 246 mg/dL — ABNORMAL HIGH (ref 70–99)

## 2023-08-23 LAB — MAGNESIUM: Magnesium: 1.9 mg/dL (ref 1.7–2.4)

## 2023-08-23 LAB — PHOSPHORUS: Phosphorus: 2.2 mg/dL — ABNORMAL LOW (ref 2.5–4.6)

## 2023-08-23 MED ORDER — PANTOPRAZOLE SODIUM 40 MG PO TBEC
40.0000 mg | DELAYED_RELEASE_TABLET | Freq: Two times a day (BID) | ORAL | 1 refills | Status: DC
  Filled 2023-08-23: qty 60, 30d supply, fill #0

## 2023-08-23 MED ORDER — CARVEDILOL 12.5 MG PO TABS
12.5000 mg | ORAL_TABLET | Freq: Two times a day (BID) | ORAL | 0 refills | Status: DC
  Filled 2023-08-23: qty 30, 15d supply, fill #0
  Filled 2023-08-23: qty 60, 30d supply, fill #0

## 2023-08-23 MED ORDER — MAGNESIUM SULFATE 2 GM/50ML IV SOLN
2.0000 g | Freq: Once | INTRAVENOUS | Status: AC
Start: 2023-08-23 — End: 2023-08-23
  Administered 2023-08-23: 2 g via INTRAVENOUS
  Filled 2023-08-23: qty 50

## 2023-08-23 MED ORDER — ACETAMINOPHEN 325 MG PO TABS
650.0000 mg | ORAL_TABLET | Freq: Four times a day (QID) | ORAL | 0 refills | Status: DC | PRN
  Filled 2023-08-23: qty 20, 3d supply, fill #0

## 2023-08-23 MED ORDER — AMIODARONE HCL 200 MG PO TABS
ORAL_TABLET | ORAL | 0 refills | Status: DC
  Filled 2023-08-23: qty 78, 42d supply, fill #0

## 2023-08-23 MED ORDER — AMIODARONE HCL 200 MG PO TABS
400.0000 mg | ORAL_TABLET | Freq: Two times a day (BID) | ORAL | Status: DC
  Administered 2023-08-23: 400 mg via ORAL
  Filled 2023-08-23: qty 2

## 2023-08-23 MED ORDER — DOCUSATE SODIUM 100 MG PO CAPS
100.0000 mg | ORAL_CAPSULE | Freq: Two times a day (BID) | ORAL | 0 refills | Status: DC | PRN
  Filled 2023-08-23: qty 10, 5d supply, fill #0

## 2023-08-23 NOTE — Evaluation (Signed)
Occupational Therapy Evaluation and Discharge Patient Details Name: Connor Bryant MRN: [id number] DOB: [birth year] Today's Date: 08/23/2023   History of Present Illness 77 y.o. male admitted 08/17/2023 with weakness, lightheadedness for 2 weeks, near-syncope, anemia and GIB. Acute course complicated by hypotension and dyspnea. 11/26 EGD. Echo during admission with EF 20%. PMHx: RA, CVA, DMII, HTN, HLD, A-fib   Clinical Impression   This 77 yo male admitted with above presents to acute OT at a Mod I level from RW in his hospital room environment and pt intends to use his RW at home where he lives alone. The 5 P's of Energy Conservation were discussed with him and handout for home was provided. No further OT needs, we will D/C him.      If plan is discharge home, recommend the following: Assistance with cooking/housework;Assist for transportation    Functional Status Assessment  Patient has had a recent decline in their functional status and demonstrates the ability to make significant improvements in function in a reasonable and predictable amount of time. (without further skilled OT needs post this evaluation session.)  Equipment Recommendations  None recommended by OT       Precautions / Restrictions Precautions Precautions: Fall Restrictions Weight Bearing Restrictions: No      Mobility Bed Mobility Overal bed mobility: Modified Independent                  Transfers Overall transfer level: Modified independent Equipment used: Rolling walker (2 wheels)               General transfer comment: from regular height bed      Balance Overall balance assessment: Needs assistance Sitting-balance support: No upper extremity supported, Feet supported Sitting balance-Leahy Scale: Good     Standing balance support: Bilateral upper extremity supported, Reliant on assistive device for balance Standing balance-Leahy Scale: Poor                              ADL either performed or assessed with clinical judgement   ADL Overall ADL's : Modified independent                                       General ADL Comments: RW level; educated on the 5 Ps of Energy Conservation and provided handout for home as well.     Vision Patient Visual Report: No change from baseline              Pertinent Vitals/Pain Pain Assessment Pain Assessment: No/denies pain     Extremity/Trunk Assessment Upper Extremity Assessment Upper Extremity Assessment: Overall WFL for tasks assessed           Communication Communication Communication: No apparent difficulties   Cognition Arousal: Alert Behavior During Therapy: WFL for tasks assessed/performed Overall Cognitive Status: Within Functional Limits for tasks assessed                                                  Home Living Family/patient expects to be discharged to:: Private residence Living Arrangements: Alone Available Help at Discharge: Family;Friend(s);Available PRN/intermittently Type of Home: House Home Access: Stairs to enter [employer] of Steps: 1 Entrance Stairs-Rails: None Home Layout: One level  Bathroom Shower/Tub: Producer, television/film/video: Standard     Home Equipment: Agricultural consultant (2 wheels);Gilmer Mor - single point;BSC/3in1   Additional Comments: wife passed away in 2023-01-31; does not have a shower seat since he reports his shower is too small for one.      Prior Functioning/Environment Prior Level of Function : Independent/Modified Independent;Driving             Mobility Comments: ambulatory without DME          OT Problem List: Decreased activity tolerance;Cardiopulmonary status limiting activity         OT Goals(Current goals can be found in the care plan section) Acute Rehab OT Goals Patient Stated Goal: to go home today OT Goal Formulation: With patient         AM-PAC OT "6 Clicks"  Daily Activity     Outcome Measure Help from another person eating meals?: None Help from another person taking care of personal grooming?: None Help from another person toileting, which includes using toliet, bedpan, or urinal?: None Help from another person bathing (including washing, rinsing, drying)?: None Help from another person to put on and taking off regular upper body clothing?: None Help from another person to put on and taking off regular lower body clothing?: None 6 Click Score: 24   End of Session Equipment Utilized During Treatment: Rolling walker (2 wheels) Nurse Communication:  (pt's portable telemetry is not working properly)  Activity Tolerance: Patient tolerated treatment well Patient left: in bed;with call bell/phone within reach;with bed alarm set  OT Visit Diagnosis: Other abnormalities of gait and mobility (R26.89);Muscle weakness (generalized) (M62.81)                Time: 9147-8295 OT Time Calculation (min): 30 min Charges:  OT General Charges $OT Visit: 1 Visit OT Evaluation $OT Eval Moderate Complexity: 1 Mod OT Treatments $Self Care/Home Management : 8-22 mins    Evette Georges 08/23/2023, 2:29 PM

## 2023-08-23 NOTE — Progress Notes (Signed)
   Patient Name: Connor Bryant Date of Encounter: 08/23/2023 [facility] Cardiologist: Dina Rich, MD   Interval Summary  .    Patient sitting in chair at the bedside.  Feels well this morning.  Denies chest pain, palpitations, or shortness of breath.  Vital Signs .    Vitals:   08/22/23 2317 08/23/23 0310 08/23/23 0312 08/23/23 0740  BP: 93/67  110/68 107/85  Pulse: 94  89 94  Resp: 16  15 17   Temp: 97.7 F (36.5 C)  97.8 F (36.6 C) 97.6 F (36.4 C)  TempSrc: Oral  Oral Oral  SpO2: 98%  98% 96%  Weight:  89.2 kg    Height:        Intake/Output Summary (Last 24 hours) at 08/23/2023 0802 Last data filed at 08/23/2023 0546 Gross per 24 hour  Intake 240 ml  Output 1650 ml  Net -1410 ml      08/23/2023    3:10 AM 08/22/2023    5:00 AM 08/21/2023    3:52 AM  Last 3 Weights  Weight (lbs) 196 lb 10.4 oz 200 lb 2.8 oz 206 lb 9.1 oz  Weight (kg) 89.2 kg 90.8 kg 93.7 kg      Telemetry/ECG    Atrial fibrillation heart rate 100 to 120 bpm no other significant arrhythmia - Personally Reviewed  Physical Exam .   GEN: No acute distress.   Neck: No JVD Cardiac: Irregularly irregular, no murmurs, rubs, or gallops.  Respiratory: Clear to auscultation bilaterally. GI: Soft, nontender, non-distended  MS: No edema  Assessment & Plan .     1.  Acute on chronic heart failure with reduced ejection fraction (HFrEF): Acute drop in LVEF noted.  GDMT was reduced due to kidney injury and hypotension as the patient presented with GI bleeding requiring fluid resuscitation and packed red blood cell transfusion.  Now on carvedilol alone at reduced dose.  Blood pressure remains adequate but soft overall, ranging 93/67 mmHg up to 110/68 mmHg.  Would continue current medical therapy.  The patient does not appear acutely volume overloaded.  Renal function improving with creatinine at 1.8 today. 2.  Atrial fibrillation, persistent: Will increase amiodarone to 400 mg twice daily  for better rate control and quicker loading.  Consider elective cardioversion when patient fully loaded with amiodarone.  Would continue amiodarone 400 mg twice daily x 2 weeks, then reduce his dose to 200 mg daily thereafter.  Continue apixaban for anticoagulation.  Overall seems to have stabilized from a cardiac perspective.  Plan as outlined above.  I would anticipate his discharge on carvedilol alone due to his soft blood pressure and kidney disease.  Atrial fibs treatment as outlined.  Will arrange outpatient cardiology follow-up.  For questions or updates, please contact [facility] Please consult [email or web address] for contact info under        Signed, Tonny Bollman, MD

## 2023-08-23 NOTE — Discharge Summary (Signed)
Physician Discharge Summary   Patient: Connor Bryant MRN: [id number] DOB: 1946/07/23  Admit date:     08/17/2023  Discharge date: 08/23/23  Discharge Physician: Marguerita Merles, DO   PCP: Mechele Claude, MD   Recommendations at discharge:   Follow-up with PCP within 1 to 2 weeks and repeat CBC, CMP, mag, Phos within 1 week Follow-up with cardiology in outpatient setting With gastroenterology in outpatient setting  Discharge Diagnoses: Principal Problem:   GI bleed Active Problems:   GIB (gastrointestinal bleeding)   Hypotension   AKI (acute kidney injury) (HCC)   Respiratory distress   Acute on chronic anemia   Symptomatic anemia   On apixaban therapy   Paroxysmal atrial fibrillation (HCC)   HFrEF (heart failure with reduced ejection fraction) (HCC)  Resolved Problems:   * No resolved hospital problems. *  Hospital Course: HPI per PCCM: 77 year old male with PMH as below, which is significant for RA recently started on methotrexate, prednisone, Atrial fibrillation and Eliquis, DM, HLD, and stroke. He presented to [facility] emergency department on 11/23 with complaints of weakness and lightheadedness starting 2 weeks prior to presentation. Symptoms really intensified about 1 week prior to arrival. On the day of presentation he experienced near syncope when rising to a standing position from sitting which prompted him to present to the emergency department via EMS. Upon EMS arrival systolic blood pressure was noted to be 58. He was infused with 1 L of normal saline and systolic blood pressure improved to the 80s. He has been taking his multiple antihypertensives as prescribed. Also described dark stools intermittently over period of weeks. Laboratory evaluation significant for hemoglobin 7.5 (which was 10.5 in August), INR 2.1, troponin elevated at 546, BUN 70, and creatinine 2.39. Despite 2 units of packed red blood cells and IV fluid resuscitation borderline hypotension remained and  the patient was transferred to [facility] for ICU care with PCCM admitting.   Significant Hospital Events: Including procedures, antibiotic start and stop dates in addition to other pertinent events 11/23 admitted for hypotension secondary to acute blood loss anemia and GI bleed 11/24 transferred out of ICU to [facility] for 11/25 pickup 11/26 EGD with findings of salmon colored mucosa of esophagus, gastritis, nodular mucosa in duodenal bulb. Dyspneic after EGD. Received 20mg  Lasix and put on BiPAP in PACU. PCCM consulted.  **Interim History Transferred to [facility] service on 08/22/23. He is slowly improving. PT currently recommending Home Health PT. Will need to Wean O2 further  Assessment and Plan:  Acute Respiratory Failure with Hypoxia in the 2/2 of Volume Overload and Pulmonary Edema from Acute CHF requiring noninvasive positive pressure ventilation with BiPAP -Improving and now off of BiPAP SpO2: 96 % O2 Flow Rate (L/min): 2 L/min FiO2 (%): 40 % -Continuous pulse oximetry maintain O2 saturation greater 92% -Continue supplemental oxygen via nasal cannula wean O2 as tolerated -Continue with Xopenex 0.63 mg nebs every 3 hours as needed for wheezing and shortness of breath -Will need an ambulatory O2 screen prior to discharge and repeat chest x-ray in the a.m.  Acute Systolic CHF with Reduced EF of 20-25% Elevated Troponin and Demand Ischemia with Hx of Cardiomyopathy -BNP was 1228.1 and troponin peaked at 2079 and cardiology is currently following -Strict I's and O's and Daily  Intake/Output Summary (Last 24 hours) at 08/22/2023 1841 Last data filed at 08/22/2023 1620 Gross per 24 hour  Intake 240 ml  Output 1050 ml  Net -810 ml  -Repeat echocardiogram x 2  this admission showed a reduced EF of 20 to 25% with mid and distal anterior wall and mid and distal lateral wall and mid and distal anteroseptum and mid and distal inferior wall and mid anterior lateral segment and mid inferior septal  segment akinesis -Cardiology attempting GDMT and he is already on carvedilol now.  Has not received his ACE inhibitor since 08/17/2023 -Marcelline Deist was never started but will hold given his renal function -Currently on carvedilol 12.5 mg p.o. twice daily -Cardiology feels that this is low suspicion for ACS and that his elevated troponins are related as tachycardia mediated cardiomyopathy -They feel he is not a candidate for cardiac catheterization with his Renal Fxn   PAF with RVR -Was on IV Amiodarone and now on po Amiodarone Taper with 200 mg po BID x 14 days and then po Amiodarone 200 mg po Daily starting 200 mg Daily  -Given that is okay to resume anticoagulation per GI his anticoagulation has been resumed with Apixaban  Moderate Aortic Stenosis -Had TTE this Admission which significant aortic valve as well as moderate aortic stenosis with a mean gradient of 7.6 mm.  Peak gradient 14.5 mmHg with a VTI of 0.6 cm  -Highly auscultated murmur on physical examination -Further care per cardiology  AKI on CKD Stage 3a -Has a baseline Cr of 1.4-1.6 -BUN/Cr Trend: Recent Labs  Lab 08/17/23 1437 08/18/23 0311 08/19/23 0213 08/20/23 0431 08/20/23 1555 08/21/23 0331 08/22/23 0839  BUN 70* 67* 67* 59* 58* 62* 63*  CREATININE 2.39* 2.46* 2.14* 1.88* 1.86* 2.15* 1.93*  -Avoid Nephrotoxic Medications, Contrast Dyes, Hypotension and Dehydration to Ensure Adequate Renal Perfusion and will need to Renally Adjust Meds -Continue to Monitor and Trend Renal Function carefully and repeat CMP in the AM   GI Bleeding 2/2 Erosive Gastritis exacerbated by Anticoagulation with Apixaban, Prednisone Use and EtOH consumption Normocytic Anemia -Hgb/Hct Trend: Recent Labs  Lab 08/18/23 0311 08/18/23 1404 08/18/23 2105 08/19/23 0213 08/20/23 0431 08/21/23 0331 08/22/23 0626  HGB 9.2* 10.0* 9.1* 9.2* 9.3* 8.7* 9.5*  HCT 26.8* 29.5* 26.6* 26.4* 27.1* 25.8* 27.7*  MCV  --   --   --   --  91.9 92.8 92.3   -Iron Panel done and showed an Iron level of 40, UIBC of 206, TIBC of 246, Saturation Ratios of 16%, and Ferritin Level of 369 -S/p Iron Transfusion with Venofer -Continue to Monitor for S/Sx of Bleeding -C/w PPI BID with Pantoprazole 40 mg po BID -Repeat CBC in the AM  Chronic Gout -C/w Allopurinol 100 mg po Daily  Hypokalemia -Patient's K+ Level Trend: Recent Labs  Lab 08/18/23 0311 08/19/23 0213 08/20/23 0431 08/20/23 0758 08/20/23 1555 08/21/23 0331 08/22/23 0839  K 3.9 3.4* 2.6* 3.2* 3.9 3.7 3.4*  -Replete with po Kcl 40 mEQ BID x2 -Continue to Monitor and Replete as Necessary -Repeat CMP in the AM   Hyperbilirubinemia -T Bili trend: Recent Labs  Lab 08/17/23 1437 08/20/23 0431 08/22/23 0839  BILITOT 1.2* 1.6* 1.2*  -Continue to Monitor and Trend and Repeat CMP in the AM  Hypoalbuminemia -Patient's Albumin Trend: Recent Labs  Lab 08/17/23 1437 08/20/23 0431 08/22/23 0839  ALBUMIN 3.2* 2.8* 3.1*  -Continue to Monitor and Trend and repeat CMP in the AM  Obesity -Complicates overall prognosis and care -Estimated body mass index is 30.44 kg/m as calculated from the following:   Height as of this encounter: 5\' 8"  (1.727 m).   Weight as of this encounter: 90.8 kg.  -Weight Loss and Dietary  Counseling given   Assessment and Plan: No notes have been filed under this hospital service. Service: Hospitalist     {Tip this will not be part of the note when signed Body mass index is 29.9 kg/m. , ,  (Optional):26781}  {(NOTE) Pain control PDMP Statment (Optional):26782} Consultants: *** Procedures performed: ***  Disposition: {Plan; Disposition:26390} Diet recommendation:  {Diet_Plan:26776} DISCHARGE MEDICATION: Allergies as of 08/23/2023   No Known Allergies   Med Rec must be completed prior to using this [facility]***        Durable Medical Equipment  (From admission, onward)           Start     Ordered   08/23/23 0921  For home use  only DME Bedside commode  Once       Comments: Confine to one room  Question:  Patient needs a bedside commode to treat with the following condition  Answer:  Gait instability   08/23/23 0921            Follow-up Information     [facility]. Go in 12 day(s).   Specialty: Cardiology Why: Hospital follow up 09/03/2023 @ 3 pm PLEASE bring a current medication list to appointment FREE valet parking, Entrance C, off [employer] information: [address] [place] [address] [phone]        Mechele Claude, MD Follow up.   Specialty: Family Medicine Contact information: [address] [place] [address] [phone]                Discharge Exam: Ceasar Mons Weights   08/21/23 0352 08/22/23 0500 08/23/23 0310  Weight: 93.7 kg 90.8 kg 89.2 kg   ***  Condition at discharge: {DC Condition:26389}  The results of significant diagnostics from this hospitalization (including imaging, microbiology, ancillary and laboratory) are listed below for reference.   Imaging Studies: DG CHEST PORT 1 VIEW  Result Date: 08/23/2023 CLINICAL DATA:  77 year old male with history of shortness of breath. EXAM: PORTABLE CHEST 1 VIEW COMPARISON:  Chest x-ray 08/20/2023. FINDINGS: Lung volumes are slightly low. There is cephalization of the pulmonary vasculature and slight indistinctness of the interstitial markings suggestive of mild pulmonary edema. Small bilateral pleural effusions. Bibasilar opacities likely reflect areas of passive subsegmental atelectasis. Mild cardiomegaly. The patient is rotated to the right on today's exam, resulting in distortion of the mediastinal contours and reduced diagnostic sensitivity and specificity for mediastinal pathology. Atherosclerotic calcifications are noted in the thoracic aorta. IMPRESSION: 1. The appearance the chest suggests worsening congestive heart failure, as above. 2.  Aortic atherosclerosis. Electronically Signed   By: Trudie Reed M.D.   On: 08/23/2023 07:56   ECHOCARDIOGRAM LIMITED  Result Date: 08/20/2023    ECHOCARDIOGRAM LIMITED REPORT   Patient Name:   Connor Bryant Date of Exam: 08/20/2023 Medical Rec #:  [id number]      Height:       68.0 in Accession #:    [id number]     Weight:       199.1 lb Date of Birth:  Oct 25, 1945      BSA:          2.040 m Patient Age:    77 years       BP:           132/95 mmHg Patient Gender: M              HR:  118 bpm. Exam Location:  Inpatient Procedure: Limited Echo and Intracardiac Opacification Agent Indications:    NSTEMI  History:        Patient has prior history of Echocardiogram examinations, most                 recent 08/18/2023. Stroke, Arrythmias:Atrial Fibrillation,                 Signs/Symptoms:Hypotension and Syncope; Risk                 Factors:Dyslipidemia, Hypertension and Diabetes.  Sonographer:    Milda Smart Referring Phys: [id number] Lorin Glass  Sonographer Comments: Image acquisition challenging due to respiratory motion. Pt in BIPAP IMPRESSIONS  1. Left ventricular ejection fraction, by estimation, is 20 to 25%. The left ventricle has severely decreased function. The left ventricle demonstrates regional wall motion abnormalities (see scoring diagram/findings for description).  2. Right ventricular systolic function is moderately reduced. The right ventricular size is normal.  3. Left atrial size was mildly dilated. Conclusion(s)/Recommendation(s): Limited echo to assess LV function. The distal 2/3 of the LV are akientic. LVEF 20-25%. FINDINGS  Left Ventricle: Left ventricular ejection fraction, by estimation, is 20 to 25%. The left ventricle has severely decreased function. The left ventricle demonstrates regional wall motion abnormalities. Definity contrast agent was given IV to delineate the left ventricular endocardial borders.  LV Wall Scoring: The mid and distal anterior wall, mid and  distal lateral wall, mid and distal anterior septum, mid and distal inferior wall, mid anterolateral segment, and mid inferoseptal segment are akinetic. Right Ventricle: The right ventricular size is normal. Right ventricular systolic function is moderately reduced. Left Atrium: Left atrial size was mildly dilated. Mitral Valve: Mild mitral annular calcification. Arvilla Meres MD Electronically signed by Arvilla Meres MD Signature Date/Time: 08/20/2023/3:55:08 PM    Final    DG CHEST PORT 1 VIEW  Result Date: 08/20/2023 CLINICAL DATA:  Dyspnea EXAM: PORTABLE CHEST 1 VIEW COMPARISON:  X-ray 08/17/2023 and older FINDINGS: Stable enlarged cardiopericardial silhouette with widened mediastinum and calcified tortuous aorta. Again node made is small pleural effusions with vascular congestion and interstitial changes. Vascular calcifications. Degenerative changes along the spine. IMPRESSION: Increasing small pleural effusions. Electronically Signed   By: Karen Kays M.D.   On: 08/20/2023 15:49   ECHOCARDIOGRAM COMPLETE  Result Date: 08/18/2023    ECHOCARDIOGRAM REPORT   Patient Name:   Connor Bryant Date of Exam: 08/18/2023 Medical Rec #:  [id number]      Height:       68.0 in Accession #:    [id number]     Weight:       201.1 lb Date of Birth:  [birth year]      BSA:          2.048 m Patient Age:    77 years       BP:           101/69 mmHg Patient Gender: M              HR:           81 bpm. Exam Location:  Inpatient Procedure: 2D Echo, Cardiac Doppler, Color Doppler and Intracardiac            Opacification Agent Indications:    Dyspnea  History:        Patient has prior history of Echocardiogram examinations, most  recent 04/05/2022. CAD, Arrythmias:Atrial Fibrillation,                 Signs/Symptoms:Hypotension; Risk Factors:Hypertension and                 Diabetes. NSTEMI.  Sonographer:    JLS Referring Phys: [id number] Clarene Critchley HOFFMAN IMPRESSIONS  1. Left ventricular ejection fraction, by  estimation, is 30 to 35%. The left ventricle has moderately decreased function. The left ventricle demonstrates global hypokinesis. Left ventricular diastolic function could not be evaluated.  2. Right ventricular systolic function is normal. The right ventricular size is mildly enlarged. There is moderately elevated pulmonary artery systolic pressure. The estimated right ventricular systolic pressure is 59.6 mmHg.  3. Left atrial size was mildly dilated.  4. The mitral valve is abnormal. Mild to moderate mitral valve regurgitation. Mild mitral stenosis. The mean mitral valve gradient is 5.0 mmHg. Moderate to severe mitral annular calcification.  5. The aortic valve was not well visualized. There is severe calcifcation of the aortic valve. There is severe thickening of the aortic valve. Aortic valve regurgitation is not visualized. Moderate aortic valve stenosis. Aortic valve mean gradient measures 7.6 mmHg. Aortic valve Vmax measures 1.90 m/s.  6. The inferior vena cava is dilated in size with <50% respiratory variability, suggesting right atrial pressure of 15 mmHg. FINDINGS  Left Ventricle: Left ventricular ejection fraction, by estimation, is 30 to 35%. The left ventricle has moderately decreased function. The left ventricle demonstrates global hypokinesis. The left ventricular internal cavity size was normal in size. There is no left ventricular hypertrophy. Left ventricular diastolic function could not be evaluated due to atrial fibrillation. Left ventricular diastolic function could not be evaluated.  LV Wall Scoring: The mid and distal anterior wall, entire apex, mid and distal inferior wall, mid anterolateral segment, and mid inferoseptal segment are akinetic. Right Ventricle: The right ventricular size is mildly enlarged. No increase in right ventricular wall thickness. Right ventricular systolic function is normal. There is moderately elevated pulmonary artery systolic pressure. The tricuspid regurgitant  velocity is 3.34 m/s, and with an assumed right atrial pressure of 15 mmHg, the estimated right ventricular systolic pressure is 59.6 mmHg. Left Atrium: Left atrial size was mildly dilated. Right Atrium: Right atrial size was normal in size. Pericardium: There is no evidence of pericardial effusion. Mitral Valve: The mitral valve is abnormal. Moderate to severe mitral annular calcification. Mild to moderate mitral valve regurgitation. Mild mitral valve stenosis. MV peak gradient, 9.2 mmHg. The mean mitral valve gradient is 5.0 mmHg. Tricuspid Valve: The tricuspid valve is not well visualized. Tricuspid valve regurgitation is trivial. No evidence of tricuspid stenosis. Aortic Valve: The aortic valve was not well visualized. There is severe calcifcation of the aortic valve. There is severe thickening of the aortic valve. Aortic valve regurgitation is not visualized. Moderate aortic stenosis is present. Aortic valve mean  gradient measures 7.6 mmHg. Aortic valve peak gradient measures 14.5 mmHg. Aortic valve area, by VTI measures 0.67 cm. Pulmonic Valve: The pulmonic valve was not well visualized. Pulmonic valve regurgitation is not visualized. No evidence of pulmonic stenosis. Aorta: The aortic root and ascending aorta are structurally normal, with no evidence of dilitation. Venous: The inferior vena cava is dilated in size with less than 50% respiratory variability, suggesting right atrial pressure of 15 mmHg. IAS/Shunts: No atrial level shunt detected by color flow Doppler.  LEFT VENTRICLE PLAX 2D LVIDd:         5.20 cm LVIDs:  3.50 cm LV PW:         0.80 cm LV IVS:        0.80 cm LVOT diam:     1.70 cm LV SV:         23 LV SV Index:   11 LVOT Area:     2.27 cm  LV Volumes (MOD) LV vol d, MOD A2C: 125.0 ml LV vol d, MOD A4C: 178.0 ml LV vol s, MOD A2C: 71.9 ml LV vol s, MOD A4C: 103.0 ml LV SV MOD A2C:     53.1 ml LV SV MOD A4C:     178.0 ml LV SV MOD BP:      70.0 ml RIGHT VENTRICLE            IVC RV Basal  diam:  4.10 cm    IVC diam: 2.20 cm RV Mid diam:    3.10 cm RV S prime:     8.70 cm/s TAPSE (M-mode): 1.8 cm LEFT ATRIUM             Index        RIGHT ATRIUM           Index LA Vol (A2C):   62.7 ml 30.61 ml/m  RA Area:     13.40 cm LA Vol (A4C):   82.4 ml 40.23 ml/m  RA Volume:   33.30 ml  16.26 ml/m LA Biplane Vol: 72.2 ml 35.25 ml/m  AORTIC VALVE                     PULMONIC VALVE AV Area (Vmax):    0.64 cm      PV Vmax:       0.68 m/s AV Area (Vmean):   0.66 cm      PV Peak grad:  1.9 mmHg AV Area (VTI):     0.67 cm AV Vmax:           190.40 cm/s AV Vmean:          127.000 cm/s AV VTI:            0.339 m AV Peak Grad:      14.5 mmHg AV Mean Grad:      7.6 mmHg LVOT Vmax:         53.60 cm/s LVOT Vmean:        37.100 cm/s LVOT VTI:          0.100 m LVOT/AV VTI ratio: 0.29  AORTA Ao Root diam: 3.40 cm Ao Asc diam:  3.50 cm MITRAL VALVE                  TRICUSPID VALVE MV Area (PHT): 4.19 cm       TR Peak grad:   44.6 mmHg MV Area VTI:   0.98 cm       TR Vmax:        334.00 cm/s MV Peak grad:  9.2 mmHg MV Mean grad:  5.0 mmHg       SHUNTS MV Vmax:       1.52 m/s       Systemic VTI:  0.10 m MV Vmean:      103.0 cm/s     Systemic Diam: 1.70 cm MV Decel Time: 181 msec MR Peak grad:    84.8 mmHg MR Mean grad:    55.3 mmHg MR Vmax:         460.33 cm/s MR Vmean:  352.7 cm/s MR PISA:         0.57 cm MR PISA Eff ROA: 10 mm MR PISA Radius:  0.30 cm MV E velocity: 117.00 cm/s Donato Schultz MD Electronically signed by Donato Schultz MD Signature Date/Time: 08/18/2023/2:31:12 PM    Final    DG CHEST PORT 1 VIEW  Result Date: 08/17/2023 CLINICAL DATA:  Shortness of breath EXAM: PORTABLE CHEST 1 VIEW COMPARISON:  Chest x-ray 08/17/2023 FINDINGS: The heart is enlarged. There are increased central interstitial markings bilaterally. There are additional patchy airspace opacities in the right mid lung. No pleural effusion or pneumothorax identified. No acute fractures are seen. IMPRESSION: 1. Cardiomegaly with  increased central interstitial markings bilaterally, likely pulmonary edema. 2. Additional patchy airspace opacities in the right mid lung may represent atelectasis or pneumonia. Electronically Signed   By: Darliss Cheney M.D.   On: 08/17/2023 20:13   DG Chest Portable 1 View  Result Date: 08/17/2023 CLINICAL DATA:  GI bleed, hypotension EXAM: PORTABLE CHEST 1 VIEW COMPARISON:  05/27/2020 FINDINGS: Cardiomegaly. Diffuse bilateral interstitial pulmonary opacity. Chronic, callused fracture of the left clavicle. IMPRESSION: Cardiomegaly with diffuse bilateral interstitial pulmonary opacity, consistent with edema or atypical/viral infection. No focal airspace opacity. Electronically Signed   By: Jearld Lesch M.D.   On: 08/17/2023 15:31    Microbiology: Results for orders placed or performed during the hospital encounter of 08/17/23  MRSA Next Gen by PCR, Nasal     Status: None   Collection Time: 08/17/23  6:56 PM   Specimen: Nasal Mucosa; Nasal Swab  Result Value Ref Range Status   MRSA by PCR Next Gen NOT DETECTED NOT DETECTED Final    Comment: (NOTE) The GeneXpert MRSA Assay (FDA approved for NASAL specimens only), is one component of a comprehensive MRSA colonization surveillance program. It is not intended to diagnose MRSA infection nor to guide or monitor treatment for MRSA infections. Test performance is not FDA approved in patients less than 57 years old. Performed at [facility], [address] [place], [place] [address]     Labs: CBC: Recent Labs  Lab 08/17/23 1437 08/17/23 1939 08/19/23 0213 08/20/23 0431 08/21/23 0331 08/22/23 0626 08/23/23 0204  WBC 6.0  --   --  5.5 4.5 7.6 7.1  NEUTROABS  --   --   --   --   --  6.2 5.2  HGB 7.5*   < > 9.2* 9.3* 8.7* 9.5* 9.0*  HCT 23.3*   < > 26.4* 27.1* 25.8* 27.7* 27.3*  MCV 98.3  --   --  91.9 92.8 92.3 92.2  PLT 141*  --   --  159 153 172 171   < > = values in this interval not displayed.   Basic Metabolic  Panel: Recent Labs  Lab 08/18/23 0311 08/19/23 0213 08/20/23 0431 08/20/23 0758 08/20/23 1555 08/21/23 0331 08/22/23 0839 08/23/23 0204  NA 136   < > 137  --  137 135 138 139  K 3.9   < > 2.6* 3.2* 3.9 3.7 3.4* 3.5  CL 105   < > 104  --  104 102 101 105  CO2 20*   < > 21*  --  22 24 24 25   GLUCOSE 139*   < > 67*  --  189* 250* 205* 90  BUN 67*   < > 59*  --  58* 62* 63* 58*  CREATININE 2.46*   < > 1.88*  --  1.86* 2.15* 1.93* 1.80*  CALCIUM 9.1   < > 8.8*  --  8.8* 8.3* 8.8* 8.2*  MG 1.6*  --  1.6*  --   --  2.2 2.1 1.9  PHOS 5.1*  --   --   --  3.2 3.0 2.7 2.2*   < > = values in this interval not displayed.   Liver Function Tests: Recent Labs  Lab 08/17/23 1437 08/20/23 0431 08/22/23 0839 08/23/23 0204  AST 30 28 23 21   ALT 28 44 40 38  ALKPHOS 51 43 47 41  BILITOT 1.2* 1.6* 1.2* 1.1  PROT 6.2* 5.6* 5.9* 5.3*  ALBUMIN 3.2* 2.8* 3.1* 2.8*   CBG: Recent Labs  Lab 08/22/23 0600 08/22/23 1124 08/22/23 1643 08/22/23 2110 08/23/23 0612  GLUCAP 160* 176* 118* 130* 85    Discharge time spent: {LESS THAN/GREATER THAN:26388} 30 minutes.  Signed: Merlene Laughter, DO [facility] 08/23/2023

## 2023-08-23 NOTE — Progress Notes (Signed)
    Durable Medical Equipment  (From admission, onward)           Start     Ordered   08/23/23 0921  For home use only DME Bedside commode  Once       Comments: Confine to one room  Question:  Patient needs a bedside commode to treat with the following condition  Answer:  Gait instability   08/23/23 0921

## 2023-08-23 NOTE — TOC Transition Note (Signed)
Transition of Care [facility]) - CM/SW Discharge Note   Patient Details  Name: Connor Bryant MRN: [id number] Date of Birth: [birth year]  Transition of Care [facility]) CM/SW Contact:  Epifanio Lesches, RN Phone Number: 08/23/2023, 2:09 PM   Clinical Narrative:    Patient will DC to: home Anticipated DC date: 08/23/2023 Family notified: yes Transport by: car   Per MD patient ready for DC today. RN, patient, and patient's son notified of DC. Pt agreeable to home health services. Pt without provider preference.Referral made with [facility] and accepted. Pt declined BSC. TOC pharmacy to fill RX meds and provide for pt to go home with. Son to provide transportation to home. Post hospital f/u noted on AVS.  RNCM will sign off for now as intervention is no longer needed. Please consult [place] again if new needs arise.     Barriers to Discharge: Continued Medical Work up   Patient Goals and CMS Choice   Choice offered to / list presented to : Patient, Adult Children  Discharge Placement                         Discharge Plan and Services Additional resources added to the After Visit Summary for                  DME Arranged: Bedside commode DME Agency: [employer] Date DME Agency Contacted: 08/23/23 Time DME Agency Contacted: [phone] Representative spoke with at DME Agency: Ian Malkin HH Arranged: PT HH Agency: [facility] Date [facility] Agency Contacted: 08/23/23 Time HH Agency Contacted: 0910 Representative spoke with at [facility] Agency: Tresa Endo  Social Determinants of Health (SDOH) Interventions SDOH Screenings   Food Insecurity: No Food Insecurity (08/19/2023)  Housing: Low Risk  (08/19/2023)  Transportation Needs: No Transportation Needs (08/19/2023)  Utilities: Not At Risk (08/19/2023)  Alcohol Screen: Low Risk  (08/19/2023)  Depression (PHQ2-9): Low Risk  (08/01/2023)  Financial Resource Strain: Low Risk  (08/19/2023)  Physical Activity: Insufficiently Active  (12/03/2022)  Social Connections: Moderately Isolated (12/03/2022)  Stress: No Stress Concern Present (12/03/2022)  Tobacco Use: Medium Risk (08/17/2023)     Readmission Risk Interventions     No data to display

## 2023-08-23 NOTE — Progress Notes (Signed)
Consult for PIV placement. Nurse reports patient being DC'd home, no need for PIV at this time. Tomasita Morrow, RN VAST

## 2023-08-23 NOTE — Progress Notes (Signed)
Physical Therapy Treatment Patient Details Name: Connor Bryant MRN: [id number] DOB: 02/22/1946 Today's Date: 08/23/2023   History of Present Illness 77 y.o. male admitted 08/17/2023 with weakness, lightheadedness for 2 weeks, near-syncope, anemia and GIB. Acute course complicated by hypotension and dyspnea. 11/26 EGD. Echo during admission with EF 20%. PMHx: RA, CVA, DMII, HTN, HLD, A-fib    PT Comments  Pt agreeable to mobility. Pt showing increased ambulation distance, required seated rest break after 47ft, went 41ft then required another break, pt completed 1 step with introduction to using RW backwards going up steps, pt reported step at home is much shorter than stair here, will tolerate home setup well. Pt ambulated 50 more ft before becoming fatigued. Pt most limited by decreased activity tolerance. Pt educated on LE HEP and staying up in chair for increased lung function.  SPO2% >90% RA    If plan is discharge home, recommend the following: A little help with bathing/dressing/bathroom;Assistance with cooking/housework;Assist for transportation;Help with stairs or ramp for entrance   Can travel by private vehicle        Equipment Recommendations  BSC/3in1    Recommendations for Other Services       Precautions / Restrictions Precautions Precautions: Fall Restrictions Weight Bearing Restrictions: No     Mobility  Bed Mobility               General bed mobility comments: greeted in chair    Transfers Overall transfer level: Needs assistance Equipment used: Rolling walker (2 wheels) Transfers: Sit to/from Stand Sit to Stand: Contact guard assist           General transfer comment: pt rose from recliner with use of arm rest, CGA to steady upon standing    Ambulation/Gait Ambulation/Gait assistance: Contact guard assist Gait Distance (Feet): 60 Feet Assistive device: Rolling walker (2 wheels) Gait Pattern/deviations: Step-through pattern, Decreased stride  length   Gait velocity interpretation: <1.31 ft/sec, indicative of household ambulator   General Gait Details: slowed step-through gait, reduced stride length, pt reports legs feeling weak throughout ambulation, ambulated 71ft-25ft-50ft   Stairs Stairs: Yes Stairs assistance: Contact guard assist Stair Management: No rails, Backwards, With walker Number of Stairs: 1 General stair comments: pt reports not liking backwards RW on stairs and will not do it at home   Wheelchair Mobility     Tilt Bed    Modified Rankin (Stroke Patients Only)       Balance Overall balance assessment: Needs assistance Sitting-balance support: No upper extremity supported, Feet supported Sitting balance-Leahy Scale: Good     Standing balance support: Bilateral upper extremity supported, Reliant on assistive device for balance Standing balance-Leahy Scale: Poor                              Cognition Arousal: Alert Behavior During Therapy: WFL for tasks assessed/performed Overall Cognitive Status: Within Functional Limits for tasks assessed                                          Exercises      General Comments        Pertinent Vitals/Pain Pain Assessment Pain Assessment: No/denies pain    Home Living  Prior Function            PT Goals (current goals can now be found in the care plan section) Progress towards PT goals: Progressing toward goals    Frequency    Min 1X/week      PT Plan      Co-evaluation              AM-PAC PT "6 Clicks" Mobility   Outcome Measure  Help needed turning from your back to your side while in a flat bed without using bedrails?: A Little Help needed moving from lying on your back to sitting on the side of a flat bed without using bedrails?: A Little Help needed moving to and from a bed to a chair (including a wheelchair)?: A Little Help needed standing up from a chair  using your arms (e.g., wheelchair or bedside chair)?: A Little Help needed to walk in hospital room?: A Little Help needed climbing 3-5 steps with a railing? : A Lot 6 Click Score: 17    End of Session Equipment Utilized During Treatment: Gait belt Activity Tolerance: Patient tolerated treatment well Patient left: in chair;with call bell/phone within reach Nurse Communication: Mobility status PT Visit Diagnosis: Other abnormalities of gait and mobility (R26.89);Muscle weakness (generalized) (M62.81)     Time: 7829-5621 PT Time Calculation (min) (ACUTE ONLY): 26 min  Charges:    $Gait Training: 23-37 mins PT General Charges $$ ACUTE PT VISIT: 1 Visit                     Andrey Farmer. SPT Secure chat preferred    Darlin Drop 08/23/2023, 10:23 AM

## 2023-08-26 ENCOUNTER — Telehealth: Payer: Self-pay | Admitting: *Deleted

## 2023-08-26 ENCOUNTER — Encounter: Payer: Self-pay | Admitting: *Deleted

## 2023-08-26 LAB — SURGICAL PATHOLOGY

## 2023-08-26 NOTE — Transitions of Care (Post Inpatient/ED Visit) (Signed)
08/26/2023  Name: Connor Bryant MRN: [id number] DOB: Apr 07, 1946  Today's TOC FU Call Status: Today's TOC FU Call Status:: Successful TOC FU Call Completed TOC FU Call Complete Date: 08/26/23 Patient's Name and Date of Birth confirmed. Patient agreeable to participate in 30 day program  Transition Care Management Follow-up Telephone Call Date of Discharge: 08/23/23 Discharge Facility: [facility]) Type of Discharge: Inpatient Admission Primary Inpatient Discharge Diagnosis:: GI bleed How have you been since you were released from the hospital?:  (pt states " doing much better" , having regular bowel movements, appetite good) Any questions or concerns?: Yes Patient Questions/Concerns:: home health has not called-- RN Care manager addressed, see note below Patient Questions/Concerns Addressed: Other: (notified HH)  Items Reviewed: Did you receive and understand the discharge instructions provided?: Yes Medications obtained,verified, and reconciled?: Yes (Medications Reviewed) Any new allergies since your discharge?: No Dietary orders reviewed?: Yes Type of Diet Ordered:: cardiac diet/  carbohydrate modified Do you have support at home?: Yes People in Home: alone Name of Support/Comfort Primary Source: Floy Sabina   Goals Addressed             This Visit's Progress    Transitions of Care/ pt will have no readmission within 30 days       Current Barriers:  Chronic Disease Management support and education needs related to CHF and GI bleed  Patient reports doing well since discharge, has "no GI bleeding and never did have any"  pt states " they are not sure where bleeding is coming from"  pt states he lives alone, has son that checks in on him regularly Patient reports he has a scale and will start weighing again when he is more stable on his feet, has walker he uses regularly  RNCM Clinical Goal(s):  Patient will work with the Care Management team over the next 30 days to  address Transition of Care Barriers: Home Health services verbalize understanding of plan for management of CHF and GI bleed as evidenced by patient report, review of EMR take all medications exactly as prescribed and will call provider for medication related questions as evidenced by patient report, refill history and review of EMR attend all scheduled medical appointments: primary care provider, GI and cardiology as evidenced by patient report, review of EMR  through collaboration with RN Care manager, provider, and care team.   Interventions: Evaluation of current treatment plan related to  self management and patient's adherence to plan as established by provider   Heart Failure Interventions:  (Status:  New goal. and Goal on track:  Yes.) Short Term Goal Basic overview and discussion of pathophysiology of Heart Failure reviewed Assessed need for readable accurate scales in home Provided education about placing scale on hard, flat surface Advised patient to weigh each morning after emptying bladder Discussed importance of daily weight and advised patient to weigh and record daily Discussed the importance of keeping all appointments with provider Assessed social determinant of health barriers  Reviewed HF action plan, importance of calling provider early on for change in health status/ symptoms Reviewed medications Reviewed all upcoming scheduled appointments  GI Bleed: Reviewed signs/ symptoms GI bleeding Reviewed importance of reporting any s/s GI bleeding to provider Reviewed discharge instructions/ diet Telephone call to [facility] home health to verify when home visits will start, per Learta Codding pt will get a call this week and be seen by 09/02/23 for PT/ OT  Patient Goals/Self-Care Activities: Participate in Transition of Care Program/Attend TOC scheduled  calls Notify RN Care Manager of TOC call rescheduling needs Take all medications as prescribed Attend all scheduled provider  appointments Call pharmacy for medication refills 3-7 days in advance of running out of medications Call provider office for new concerns or questions  call office if I gain more than 2 pounds in one day or 5 pounds in one week keep legs up while sitting use salt in moderation watch for swelling in feet, ankles and legs every day weigh myself daily develop a rescue plan follow rescue plan if symptoms flare-up eat more whole grains, fruits and vegetables, lean meats and healthy fats dress right for the weather, hot or cold Report any signs/ symptoms of GI bleeding to your doctor [facility] will be in touch with you this week to schedule appointment for PT/ OT, their contact number is [phone]  Follow Up Plan:  Telephone follow up appointment with care management team member scheduled for:  09/03/23 at 930 am           Medications Reviewed Today: Medications Reviewed Today     Reviewed by Audrie Gallus, RN (Registered Nurse) on 08/26/23 at 1008  Med List Status: <None>   Medication Order Taking? Sig Documenting Provider Last Dose Status Informant  acetaminophen (TYLENOL) 325 MG tablet [id number] Yes Take 2 tablets (650 mg total) by mouth every 6 (six) hours as needed for mild pain (pain score 1-3), fever or headache. Sheikh, Omair [place], DO Taking Active   allopurinol (ZYLOPRIM) 100 MG tablet [id number] Yes TAKE 1 TABLET 2 TO 3 TIMES A DAY FOR GOUT  Patient taking differently: Take 100 mg by mouth daily.   Mechele Claude, MD Taking Active Self  amiodarone (PACERONE) 200 MG tablet [id number] Yes Take 2 tablets (400 mg total) by mouth 2 (two) times daily for 12 days, THEN 1 tablet (200 mg total) daily. Sheikh, Omair [place], DO Taking Active   apixaban (ELIQUIS) 5 MG TABS tablet [id number] Yes Take 1 tablet (5 mg total) by mouth 2 (two) times daily. Mechele Claude, MD Taking Active Self  atorvastatin (LIPITOR) 80 MG tablet [id number] Yes Take 1 tablet (80 mg total) by mouth  daily. Mechele Claude, MD Taking Active Self  Blood Glucose Monitoring Suppl DEVI [id number] Yes 1 each by Does not apply route in the morning, at noon, and at bedtime. May substitute to any manufacturer covered by patient's insurance. Mechele Claude, MD Taking Active Self  calcium carbonate (OSCAL) 1500 (600 Ca) MG TABS tablet [id number] Yes Take 600 mg of elemental calcium by mouth daily with breakfast. [provider] Taking Active Self  carvedilol (COREG) 12.5 MG tablet [id number] Yes Take 1 tablet (12.5 mg total) by mouth 2 (two) times daily with a meal. Marguerita Merles [place], DO Taking Active   docusate sodium (COLACE) 100 MG capsule [id number] Yes Take 1 capsule (100 mg total) by mouth 2 (two) times daily as needed for mild constipation. Marguerita Merles [place], DO Taking Active   ferrous sulfate 325 (65 FE) MG EC tablet [id number] Yes Take 325 mg by mouth daily with breakfast. [provider] Taking Active Self  folic acid (FOLVITE) 1 MG tablet [id number] Yes Take 1 tablet (1 mg total) by mouth daily. Mechele Claude, MD Taking Active Self  glimepiride (AMARYL) 2 MG tablet [id number] Yes TAKE ONE TABLET ONCE DAILY EVERY MORNING  Patient taking differently: Take 2 mg by mouth daily with breakfast.   Mechele Claude, MD Taking Active Self  levocetirizine (XYZAL) 5 MG tablet [id number]  Yes TAKE ONE TABLET EVERY Nadeen Landau, MD Taking Active Self  methotrexate (RHEUMATREX) 2.5 MG tablet [id number] No Take 4 tablets (10 mg total) by mouth once a week. Caution:Chemotherapy. Protect from light.  Patient not taking: Reported on 08/26/2023   Mechele Claude, MD Not Taking Active Self           Med Note Haywood Lasso   IRJ Aug 17, 2023  4:54 PM) Last Dose was 10mg  QW. Pt was recently decreased to 5mg  QW but has not taken this new dose yet.   pantoprazole (PROTONIX) 40 MG tablet [id number] Yes Take 1 tablet (40 mg total) by mouth 2 (two) times daily. [address], Kateri Mc [place], DO Taking Active    polyethylene glycol [facility] / GLYCOLAX) packet [id number] Yes Take 17 g by mouth daily. Leatha Gilding, MD Taking Active Self  predniSONE (DELTASONE) 10 MG tablet [id number] No Take 1 tablet (10 mg total) by mouth daily with breakfast.  Patient not taking: Reported on 08/26/2023   Mechele Claude, MD Not Taking Active Self           Med Note Haywood Lasso   Sat Aug 17, 2023  4:55 PM) Pt stopped this dose on 08/09/2023, took a dose today  pyridOXINE (B-6) 50 MG tablet [id number] Yes Take 1 tablet (50 mg total) by mouth daily. Mechele Claude, MD Taking Active Self            Home Care and Equipment/Supplies: Were Home Health Services Ordered?: Yes Name of Home Health Agency:: [facility] Has Agency set up a time to come to your home?: No EMR reviewed for Home Health Orders:  (Verified with [facility] they do now have orders and will be calling pt this week to schedule home visit for PT/ OT and may be next Monday 12/9 before 1st visit made d/t short staffed) Any new equipment or medical supplies ordered?: No  Functional Questionnaire: Do you need assistance with bathing/showering or dressing?: No Do you need assistance with meal preparation?: No Do you need assistance with eating?: No Do you have difficulty maintaining continence: No Do you need assistance with getting out of bed/getting out of a chair/moving?: Yes (uses walker and has WC if needed) Do you have difficulty managing or taking your medications?: No  Follow up appointments reviewed: PCP Follow-up appointment confirmed?: Yes Date of PCP follow-up appointment?: 08/27/23 Follow-up Provider: Dr. Mechele Claude  @ 210 pm and will have [facility] Follow-up appointment confirmed?: Yes Date of Specialist follow-up appointment?: 08/30/23 Follow-Up Specialty Provider:: GI  @ 130 pm       Cardiology- [facility]  09/03/23 @ 3 pm Do you need transportation to your  follow-up appointment?: No Do you understand care options if your condition(s) worsen?: Yes-patient verbalized understanding  SDOH Interventions Today    Flowsheet Row Most Recent Value  SDOH Interventions   Food Insecurity Interventions Intervention Not Indicated  Housing Interventions Intervention Not Indicated  Transportation Interventions Intervention Not Indicated  Utilities Interventions Intervention Not Indicated       Irving Shows [facility], BSN RN Care Manager/ Transition of Care [place] [phone]

## 2023-08-27 ENCOUNTER — Encounter: Payer: Self-pay | Admitting: Family Medicine

## 2023-08-27 ENCOUNTER — Ambulatory Visit (INDEPENDENT_AMBULATORY_CARE_PROVIDER_SITE_OTHER): Payer: Medicare Other | Admitting: Family Medicine

## 2023-08-27 VITALS — BP 122/74 | HR 77 | Temp 96.9°F

## 2023-08-27 DIAGNOSIS — M0579 Rheumatoid arthritis with rheumatoid factor of multiple sites without organ or systems involvement: Secondary | ICD-10-CM | POA: Diagnosis not present

## 2023-08-27 DIAGNOSIS — N1832 Chronic kidney disease, stage 3b: Secondary | ICD-10-CM | POA: Diagnosis not present

## 2023-08-27 DIAGNOSIS — D649 Anemia, unspecified: Secondary | ICD-10-CM | POA: Diagnosis not present

## 2023-08-27 DIAGNOSIS — E1122 Type 2 diabetes mellitus with diabetic chronic kidney disease: Secondary | ICD-10-CM | POA: Diagnosis not present

## 2023-08-27 NOTE — Progress Notes (Signed)
Subjective:  Patient ID: Connor Bryant, male    DOB: [birth year]  Age: 77 y.o. MRN: [id number]  CC: Medical Management of Chronic Issues   HPI Connor Bryant presents for multiple concerns related to his recent hospitalization.  He no longer wants to take the methotrexate.  He feels that it was causing  his symptoms.  Apparently he became weak and dizzy as well as prostrate about 2 weeks ago.  He presented to the emergency room and was found to have a GI bleed.  This was at [facility].  He was given 2 units of packed red cells transfusion.  Eventually he was transferred to [facility].  Of note is at the end of his stay he was maintained on his apixaban for his atrial fibrillation.  He is also checked for congestive heart failure and noted to have a significantly reduced ejection fraction.  He also was noted to have acute kidney injury with creatinine climbing to 2.1 and BUN climbing to approximately 60.  He has physical therapy starting soon in the home.  Currently he is still weak and relying on a wheelchair but able to walk short distances of just a few feet.  He continues his usual diabetes medicines.  Of note is he was on a sliding scale in the hospital.  He did not understand exactly why they gave him the insulin they did when they gave it to him.  He denies having had any highs or lows however and is back on his usual regimen now.     08/27/2023    2:09 PM 08/01/2023    4:02 PM 08/01/2023    3:40 PM  Depression screen PHQ 2/9  Decreased Interest 0 1 0  Down, Depressed, Hopeless 0 1 0  PHQ - 2 Score 0 2 0  Altered sleeping  0   Tired, decreased energy  1   Change in appetite  0   Feeling bad or failure about yourself   0   Trouble concentrating  0   Moving slowly or fidgety/restless  0   Suicidal thoughts  0   PHQ-9 Score  3   Difficult doing work/chores  Somewhat difficult     History Chares has a past medical history of Acute CVA (cerebrovascular accident) (HCC) (05/01/2020),  Acute respiratory failure with hypoxia (HCC) (05/04/2020), Arthritis, DM type 2 (diabetes mellitus, type 2) (HCC), Endotracheally intubated, Gallstone, Heart murmur, HTN (hypertension), Hyperlipidemia, Personal history of colonic polyp-adenoma (11/24/2013), Renal insufficiency, and Thoracic spine fracture (HCC).   He has a past surgical history that includes Debridement tennis elbow (Right); Tonsillectomy and adenoidectomy; Colonoscopy; Esophagogastroduodenoscopy; Mesenteric artery bypass (N/A, 03/10/2014); abdominal aortagram (N/A, 02/23/2014); vascualr surgery; Cataract extraction, bilateral; Esophagogastroduodenoscopy (egd) with propofol (N/A, 08/20/2023); and biopsy (08/20/2023).   His family history includes AAA (abdominal aortic aneurysm) in his mother; Bladder Cancer in his father; Cancer in his father; Coronary artery disease in his father; Diabetes in his father and mother; Heart attack in his father; Heart disease in his father and mother; Hyperlipidemia in his father and mother; Hypertension in his father and mother; Stroke in his father.He reports that he quit smoking about 14 years ago. His smoking use included cigarettes. He has never used smokeless tobacco. He reports that he does not currently use alcohol. He reports that he does not currently use drugs.    ROS Review of Systems  Constitutional:  Negative for fever.  Respiratory:  Negative for shortness of breath.   Cardiovascular:  Negative for chest pain.  Musculoskeletal:  Negative for arthralgias.  Skin:  Negative for rash.    Objective:  BP 122/74   Pulse 77   Temp (!) 96.9 F (36.1 C)   SpO2 93%   BP Readings from Last 3 Encounters:  08/27/23 122/74  08/23/23 108/60  08/01/23 107/68    Wt Readings from Last 3 Encounters:  08/23/23 196 lb 10.4 oz (89.2 kg)  08/01/23 197 lb 3.7 oz (89.5 kg)  06/25/23 198 lb 12.8 oz (90.2 kg)     Physical Exam Vitals reviewed.  Constitutional:      Appearance: He is  well-developed.  HENT:     Head: Normocephalic and atraumatic.     Right Ear: External ear normal.     Left Ear: External ear normal.     Mouth/Throat:     Pharynx: No oropharyngeal exudate or posterior oropharyngeal erythema.  Eyes:     Pupils: Pupils are equal, round, and reactive to light.  Cardiovascular:     Rate and Rhythm: Normal rate and regular rhythm.     Heart sounds: No murmur heard. Pulmonary:     Effort: No respiratory distress.     Breath sounds: Normal breath sounds.  Musculoskeletal:     Cervical back: Normal range of motion and neck supple.  Neurological:     Mental Status: He is alert and oriented to person, place, and time.       Assessment & Plan:   Connor Bryant" was seen today for medical management of chronic issues.  Diagnoses and all orders for this visit:  Type 2 diabetes mellitus with stage 3b chronic kidney disease, without long-term current use of insulin (HCC) -     Microalbumin / creatinine urine ratio -     CBC with Differential/Platelet -     CMP14+EGFR  Rheumatoid arthritis involving multiple sites with positive rheumatoid factor (HCC) -     CBC with Differential/Platelet -     CMP14+EGFR  Low hemoglobin -     CBC with Differential/Platelet -     CMP14+EGFR       I have discontinued Connor Birch "[name]"'s methotrexate. I am also having him maintain his polyethylene glycol, apixaban, atorvastatin, glimepiride, levocetirizine, Blood Glucose Monitoring Suppl, predniSONE, allopurinol, folic acid, pyridOXINE, calcium carbonate, ferrous sulfate, carvedilol, amiodarone, docusate sodium, pantoprazole, and acetaminophen.  Allergies as of 08/27/2023   No Known Allergies      Medication List        Accurate as of August 27, 2023  5:58 PM. If you have any questions, ask your nurse or doctor.          STOP taking these medications    methotrexate 2.5 MG tablet Commonly known as: RHEUMATREX Stopped by: Jiovanny Burdell        TAKE these medications    acetaminophen 325 MG tablet Commonly known as: TYLENOL Take 2 tablets (650 mg total) by mouth every 6 (six) hours as needed for mild pain (pain score 1-3), fever or headache.   allopurinol 100 MG tablet Commonly known as: ZYLOPRIM TAKE 1 TABLET 2 TO 3 TIMES A DAY FOR GOUT What changed: See the new instructions.   amiodarone 200 MG tablet Commonly known as: PACERONE Take 2 tablets (400 mg total) by mouth 2 (two) times daily for 12 days, THEN 1 tablet (200 mg total) daily. Start taking on: August 23, 2023   apixaban 5 MG Tabs tablet Commonly known as: Eliquis Take 1 tablet (5 mg  total) by mouth 2 (two) times daily.   atorvastatin 80 MG tablet Commonly known as: LIPITOR Take 1 tablet (80 mg total) by mouth daily.   Blood Glucose Monitoring Suppl Devi 1 each by Does not apply route in the morning, at noon, and at bedtime. May substitute to any manufacturer covered by patient's insurance.   calcium carbonate 1500 (600 Ca) MG Tabs tablet Commonly known as: OSCAL Take 600 mg of elemental calcium by mouth daily with breakfast.   carvedilol 12.5 MG tablet Commonly known as: COREG Take 1 tablet (12.5 mg total) by mouth 2 (two) times daily with a meal.   docusate sodium 100 MG capsule Commonly known as: COLACE Take 1 capsule (100 mg total) by mouth 2 (two) times daily as needed for mild constipation.   ferrous sulfate 325 (65 FE) MG EC tablet Take 325 mg by mouth daily with breakfast.   folic acid 1 MG tablet Commonly known as: FOLVITE Take 1 tablet (1 mg total) by mouth daily.   glimepiride 2 MG tablet Commonly known as: AMARYL TAKE ONE TABLET ONCE DAILY EVERY MORNING What changed:  how much to take how to take this when to take this additional instructions   levocetirizine 5 MG tablet Commonly known as: XYZAL TAKE ONE TABLET EVERY EVENING   pantoprazole 40 MG tablet Commonly known as: PROTONIX Take 1 tablet (40 mg total) by mouth 2  (two) times daily.   polyethylene glycol 17 g packet Commonly known as: MIRALAX / GLYCOLAX Take 17 g by mouth daily.   predniSONE 10 MG tablet Commonly known as: DELTASONE Take 1 tablet (10 mg total) by mouth daily with breakfast.   pyridOXINE 50 MG tablet Commonly known as: B-6 Take 1 tablet (50 mg total) by mouth daily.         Follow-up: No follow-ups on file.  Mechele Claude, M.D.

## 2023-08-28 LAB — CMP14+EGFR
ALT: 37 [IU]/L (ref 0–44)
AST: 28 [IU]/L (ref 0–40)
Albumin: 3.8 g/dL (ref 3.8–4.8)
Alkaline Phosphatase: 73 [IU]/L (ref 44–121)
BUN/Creatinine Ratio: 16 (ref 10–24)
BUN: 28 mg/dL — ABNORMAL HIGH (ref 8–27)
Bilirubin Total: 1.3 mg/dL — ABNORMAL HIGH (ref 0.0–1.2)
CO2: 26 mmol/L (ref 20–29)
Calcium: 9.3 mg/dL (ref 8.6–10.2)
Chloride: 106 mmol/L (ref 96–106)
Creatinine, Ser: 1.7 mg/dL — ABNORMAL HIGH (ref 0.76–1.27)
Globulin, Total: 2.6 g/dL (ref 1.5–4.5)
Glucose: 108 mg/dL — ABNORMAL HIGH (ref 70–99)
Potassium: 4.4 mmol/L (ref 3.5–5.2)
Sodium: 146 mmol/L — ABNORMAL HIGH (ref 134–144)
Total Protein: 6.4 g/dL (ref 6.0–8.5)
eGFR: 41 mL/min/{1.73_m2} — ABNORMAL LOW (ref >59)

## 2023-08-28 LAB — CBC WITH DIFFERENTIAL/PLATELET
Basophils Absolute: 0 10*3/uL (ref 0.0–0.2)
Basos: 0 %
EOS (ABSOLUTE): 0.1 10*3/uL (ref 0.0–0.4)
Eos: 1 %
Hematocrit: 33.7 % — ABNORMAL LOW (ref 37.5–51.0)
Hemoglobin: 10.9 g/dL — ABNORMAL LOW (ref 13.0–17.7)
Immature Grans (Abs): 0.3 10*3/uL — ABNORMAL HIGH (ref 0.0–0.1)
Immature Granulocytes: 4 %
Lymphocytes Absolute: 2 10*3/uL (ref 0.7–3.1)
Lymphs: 24 %
MCH: 31.1 pg (ref 26.6–33.0)
MCHC: 32.3 g/dL (ref 31.5–35.7)
MCV: 96 fL (ref 79–97)
Monocytes Absolute: 0.8 10*3/uL (ref 0.1–0.9)
Monocytes: 10 %
Neutrophils Absolute: 4.8 10*3/uL (ref 1.4–7.0)
Neutrophils: 61 %
Platelets: 249 10*3/uL (ref 150–450)
RBC: 3.51 x10E6/uL — ABNORMAL LOW (ref 4.14–5.80)
RDW: 16.8 % — ABNORMAL HIGH (ref 11.6–15.4)
WBC: 8.1 10*3/uL (ref 3.4–10.8)

## 2023-08-29 NOTE — Telephone Encounter (Signed)
Pt was discharged on 08/23/23 & appointment detailed on discharge AVS.

## 2023-08-30 ENCOUNTER — Encounter: Payer: Self-pay | Admitting: Gastroenterology

## 2023-08-30 ENCOUNTER — Ambulatory Visit: Payer: Medicare Other | Admitting: Gastroenterology

## 2023-08-30 VITALS — BP 120/80 | HR 74 | Ht 68.0 in | Wt 196.4 lb

## 2023-08-30 DIAGNOSIS — I5020 Unspecified systolic (congestive) heart failure: Secondary | ICD-10-CM | POA: Diagnosis not present

## 2023-08-30 DIAGNOSIS — K922 Gastrointestinal hemorrhage, unspecified: Secondary | ICD-10-CM | POA: Diagnosis not present

## 2023-08-30 DIAGNOSIS — K2960 Other gastritis without bleeding: Secondary | ICD-10-CM

## 2023-08-30 DIAGNOSIS — Z7901 Long term (current) use of anticoagulants: Secondary | ICD-10-CM

## 2023-08-30 DIAGNOSIS — D638 Anemia in other chronic diseases classified elsewhere: Secondary | ICD-10-CM

## 2023-08-30 DIAGNOSIS — D509 Iron deficiency anemia, unspecified: Secondary | ICD-10-CM

## 2023-08-30 DIAGNOSIS — K2210 Ulcer of esophagus without bleeding: Secondary | ICD-10-CM

## 2023-08-30 DIAGNOSIS — I480 Paroxysmal atrial fibrillation: Secondary | ICD-10-CM

## 2023-08-30 NOTE — Patient Instructions (Addendum)
Follow up in 6 months  If your blood pressure at your visit was 140/90 or greater, please contact your primary care physician to follow up on this.  _______________________________________________________  If you are age 78 or older, your body mass index should be between 23-30. Your Body mass index is 29.86 kg/m. If this is out of the aforementioned range listed, please consider follow up with your Primary Care Provider.  If you are age 71 or younger, your body mass index should be between 19-25. Your Body mass index is 29.86 kg/m. If this is out of the aformentioned range listed, please consider follow up with your Primary Care Provider.   ________________________________________________________  The [facility] providers would like to encourage you to use [facility] to communicate with providers for non-urgent requests or questions.  Due to long hold times on the telephone, sending your provider a message by [facility] may be a faster and more efficient way to get a response.  Please allow 48 business hours for a response.  Please remember that this is for non-urgent requests.  _______________________________________________________   Thank you for entrusting me with your care and choosing [facility].  Bayley Leanna Sato, PA-C

## 2023-08-30 NOTE — Progress Notes (Addendum)
Chief Complaint: hospital follow up Primary GI MD: Dr. Leone Payor  HPI: 77 year old male history of A-fib (on Eliquis), HFrEF (EF 30-35%), RA recently started on methotrexate and prednisone, stroke, diabetes, and GI bleed presents for evaluation of hospital follow-up.  Recently admitted 08/17/23-08/23/23 for acute on chronic symptomatic anemia characterized by GI bleed.  Patient initially presented to [facility] with weakness and lightheadedness as well as near syncope.  When he arrived he was hypotensive (he was taking multiple antihypertensives as prescribed) and having melena with initial hemoglobin 7.5 (10.5 in August), INR 2.1, troponin 546, BUN 70, creatinine 2.39.  Despite 2 units PRBCs and IVF he remained hypotensive so he was transferred to [facility] for ICU care.  While he was at [facility] he underwent chest x-ray which showed slight worsening CHF and cardiology recommended beta-blocker upon discharge.  He also underwent EGD which showed salmon-colored mucosa, gastritis, nodular mucosa in the duodenal bulb.  He was dyspneic after EGD and received 20 Mg Lasix and put on BiPAP in the PACU.  Ultimately his GI bleed and IDA were suspected secondary to erosive gastritis exacerbated by use of Eliquis.  He was put on PPI twice daily for 8 weeks and then daily  Last labs drawn 08/27/2023 show improvement in hemoglobin with  --Hgb 10.9, MCV 96 -- BUN 28, creatinine 1.70  Patient is here today with his son.  States he has been doing better since his hospitalization.  States he still has some fatigue in the morning and he has been having some swelling in his legs that occurs in the morning.  His primary care recommended he wait until he see his cardiologist before prescribing Lasix.  In regards to his bowel movements he no longer has melena.  He remains on iron and is doing well.  Continuing to take his PPI twice daily.  PREVIOUS GI WORKUP   EGD 08/20/2023 for GI bleeding - Salmon-  colored mucosa. Biopsied.  (Reactive squamous mucosa, mild chronic gastritis, negative for intestinal metaplasia) - Gastritis. Biopsied.  (Reactive gastropathy with minimal chronic gastritis and focal lobular hyperplasia.  Negative H. pylori, intestinal metaplasia, dysplasia, and carcinoma) - Nodular mucosa in the duodenal bulb. Biopsied.(Scant benign intestinal epithelium, scant oropharyngeal squamous cells)   EGD and colonoscopy remotely with Dr. Leone Payor both done in 2015.  At EGD he had patchy gastritis, biopsies were done showing reactive gastropathy no H. pylori Colonoscopy he had 2 small shallow ulcers in the ascending colon and a 5 mm rectal polyp removed noted to have diverticulosis in the sigmoid colon.  Path and a polyp consistent with tubular adenoma right colon biopsy showed benign mucosa with surface erosion.  Past Medical History:  Diagnosis Date   Acute CVA (cerebrovascular accident) (HCC) 05/01/2020   Acute respiratory failure with hypoxia (HCC) 05/04/2020   Arthritis    DM type 2 (diabetes mellitus, type 2) (HCC)    no meds   Endotracheally intubated    Gallstone    Heart murmur    was told one time he had a murmur, not since 30 years ago   HTN (hypertension)    Hyperlipidemia    Personal history of colonic polyp-adenoma 11/24/2013   Renal insufficiency    mild   Thoracic spine fracture [facility])     Past Surgical History:  Procedure Laterality Date   ABDOMINAL AORTAGRAM N/A 02/23/2014   Procedure: ABDOMINAL Ronny Flurry;  Surgeon: Fransisco Hertz, MD;  Location: [facility] CATH LAB;  Service: Cardiovascular;  Laterality: N/A;  BIOPSY  08/20/2023   Procedure: BIOPSY;  Surgeon: Imogene Burn, MD;  Location: [facility] ENDOSCOPY;  Service: Gastroenterology;;   CATARACT EXTRACTION, BILATERAL     COLONOSCOPY     DEBRIDEMENT TENNIS ELBOW Right    ESOPHAGOGASTRODUODENOSCOPY     ESOPHAGOGASTRODUODENOSCOPY (EGD) WITH PROPOFOL N/A 08/20/2023   Procedure: ESOPHAGOGASTRODUODENOSCOPY (EGD) WITH  PROPOFOL;  Surgeon: Imogene Burn, MD;  Location: [facility] ENDOSCOPY;  Service: Gastroenterology;  Laterality: N/A;   MESENTERIC ARTERY BYPASS N/A 03/10/2014   Procedure: AORTO TO SUPERIOR MESENTERIC ARTERY BYPASS;  Surgeon: Pryor Ochoa, MD;  Location: [facility] OR;  Service: Vascular;  Laterality: N/A;   TONSILLECTOMY AND ADENOIDECTOMY     vascualr surgery     illiac stents    Current Outpatient Medications  Medication Sig Dispense Refill   allopurinol (ZYLOPRIM) 100 MG tablet TAKE 1 TABLET 2 TO 3 TIMES A DAY FOR GOUT (Patient taking differently: Take 100 mg by mouth daily.) 90 tablet 0   amiodarone (PACERONE) 200 MG tablet Take 2 tablets (400 mg total) by mouth 2 (two) times daily for 12 days, THEN 1 tablet (200 mg total) daily. 78 tablet 0   apixaban (ELIQUIS) 5 MG TABS tablet Take 1 tablet (5 mg total) by mouth 2 (two) times daily. 180 tablet 3   atorvastatin (LIPITOR) 80 MG tablet Take 1 tablet (80 mg total) by mouth daily. 90 tablet 3   Blood Glucose Monitoring Suppl DEVI 1 each by Does not apply route in the morning, at noon, and at bedtime. May substitute to any manufacturer covered by patient's insurance. 1 each 0   calcium carbonate (OSCAL) 1500 (600 Ca) MG TABS tablet Take 600 mg of elemental calcium by mouth daily with breakfast.     carvedilol (COREG) 12.5 MG tablet Take 1 tablet (12.5 mg total) by mouth 2 (two) times daily with a meal. 60 tablet 0   ferrous sulfate 325 (65 FE) MG EC tablet Take 325 mg by mouth daily with breakfast.     folic acid (FOLVITE) 1 MG tablet Take 1 tablet (1 mg total) by mouth daily. 90 tablet 3   glimepiride (AMARYL) 2 MG tablet TAKE ONE TABLET ONCE DAILY EVERY MORNING (Patient taking differently: Take 2 mg by mouth daily with breakfast.) 90 tablet 3   pantoprazole (PROTONIX) 40 MG tablet Take 1 tablet (40 mg total) by mouth 2 (two) times daily. 60 tablet 1   polyethylene glycol (MIRALAX / GLYCOLAX) packet Take 17 g by mouth daily. 30 packet 1   predniSONE  (DELTASONE) 10 MG tablet Take 1 tablet (10 mg total) by mouth daily with breakfast. 30 tablet 2   pyridOXINE (B-6) 50 MG tablet Take 1 tablet (50 mg total) by mouth daily. 90 tablet 1   No current facility-administered medications for this visit.    Allergies as of 08/30/2023   (No Known Allergies)    Family History  Problem Relation Age of Onset   Diabetes Mother    Heart disease Mother        before age 33   Hyperlipidemia Mother    Hypertension Mother    AAA (abdominal aortic aneurysm) Mother    Stroke Father    Hypertension Father    Coronary artery disease Father    Hyperlipidemia Father    Diabetes Father    Bladder Cancer Father    Cancer Father    Heart disease Father    Heart attack Father    Colon cancer Neg Hx  Throat cancer Neg Hx    Kidney disease Neg Hx    Liver disease Neg Hx     Social History   Socioeconomic History   Marital status: Widowed    Spouse name: Not on file   Number of children: 1   Years of education: Not on file   Highest education level: High school graduate  Occupational History   Occupation: Retired  Tobacco Use   Smoking status: Former    Current packs/day: 0.00    Types: Cigarettes    Quit date: 07/08/2009    Years since quitting: 14.1   Smokeless tobacco: Never  Vaping Use   Vaping status: Never Used  Substance and Sexual Activity   Alcohol use: Not Currently   Drug use: Not Currently   Sexual activity: Yes  Other Topics Concern   Not on file  Social History Narrative   Patient is married, he is retired. 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   Social Determinants of Health   Financial Resource Strain: Low Risk  (08/19/2023)   Overall Financial Resource Strain (CARDIA)    Difficulty of Paying Living Expenses: Not very hard  Food Insecurity: No Food Insecurity (08/26/2023)   Hunger Vital Sign    Worried About Running Out of Food in the Last Year: Never true    Ran Out of Food in the Last Year: Never  true  Transportation Needs: No Transportation Needs (08/26/2023)   PRAPARE - Administrator, Civil Service (Medical): No    Lack of Transportation (Non-Medical): No  Physical Activity: Insufficiently Active (12/03/2022)   Exercise Vital Sign    Days of Exercise per Week: 3 days    Minutes of Exercise per Session: 30 min  Stress: No Stress Concern Present (12/03/2022)   [employer] of Occupational Health - Occupational Stress Questionnaire    Feeling of Stress : Not at all  Social Connections: Moderately Isolated (12/03/2022)   Social Connection and Isolation Panel [NHANES]    Frequency of Communication with Friends and Family: More than three times a week    Frequency of Social Gatherings with Friends and Family: More than three times a week    Attends Religious Services: Never    Database administrator or Organizations: No    Attends Banker Meetings: Never    Marital Status: Married  Catering manager Violence: Not At Risk (08/26/2023)   Humiliation, Afraid, Rape, and Kick questionnaire    Fear of Current or Ex-Partner: No    Emotionally Abused: No    Physically Abused: No    Sexually Abused: No    Review of Systems:    Constitutional: No weight loss, fever, chills, weakness or fatigue HEENT: Eyes: No change in vision               Ears, Nose, Throat:  No change in hearing or congestion Skin: No rash or itching Cardiovascular: No chest pain, chest pressure or palpitations   Respiratory: No SOB or cough Gastrointestinal: See HPI and otherwise negative Genitourinary: No dysuria or change in urinary frequency Neurological: No headache, dizziness or syncope Musculoskeletal: No new muscle or joint pain Hematologic: No bleeding or bruising Psychiatric: No history of depression or anxiety    Physical Exam:  Vital signs: BP 120/80   Pulse 74   Ht 5\' 8"  (1.727 m)   Wt 196 lb 6.4 oz (89.1 kg)   SpO2 94%   BMI 29.86 kg/m  Constitutional: NAD, Well  developed, Well nourished, alert and cooperative.  Sitting in wheelchair. Head:  Normocephalic and atraumatic. Eyes:   PEERL, EOMI. No icterus. Conjunctiva pink. Respiratory: Respirations even and unlabored. Lungs clear to auscultation bilaterally.   No wheezes, crackles, or rhonchi.  Cardiovascular:  Regular rate and rhythm. No peripheral edema, cyanosis or pallor.  Gastrointestinal:  Soft, nondistended, nontender. No rebound or guarding. Normal bowel sounds. No appreciable masses or hepatomegaly. Rectal:  Not performed.  Msk:  Symmetrical without gross deformities. Without edema, no deformity or joint abnormality.  Neurologic:  Alert and  oriented x4;  grossly normal neurologically.  Skin:   Dry and intact without significant lesions or rashes. Psychiatric: Oriented to person, place and time. Demonstrates good judgement and reason without abnormal affect or behaviors.   RELEVANT LABS AND IMAGING: CBC    Component Value Date/Time   WBC 8.1 08/27/2023 1508   WBC 7.1 08/23/2023 0204   RBC 3.51 (L) 08/27/2023 1508   RBC 2.96 (L) 08/23/2023 0204   HGB 10.9 (L) 08/27/2023 1508   HCT 33.7 (L) 08/27/2023 1508   PLT 249 08/27/2023 1508   MCV 96 08/27/2023 1508   MCH 31.1 08/27/2023 1508   MCH 30.4 08/23/2023 0204   MCHC 32.3 08/27/2023 1508   MCHC 33.0 08/23/2023 0204   RDW 16.8 (H) 08/27/2023 1508   LYMPHSABS 2.0 08/27/2023 1508   MONOABS 0.5 08/23/2023 0204   EOSABS 0.1 08/27/2023 1508   BASOSABS 0.0 08/27/2023 1508    CMP     Component Value Date/Time   NA 146 (H) 08/27/2023 1508   K 4.4 08/27/2023 1508   CL 106 08/27/2023 1508   CO2 26 08/27/2023 1508   GLUCOSE 108 (H) 08/27/2023 1508   GLUCOSE 90 08/23/2023 0204   BUN 28 (H) 08/27/2023 1508   CREATININE 1.70 (H) 08/27/2023 1508   CALCIUM 9.3 08/27/2023 1508   PROT 6.4 08/27/2023 1508   ALBUMIN 3.8 08/27/2023 1508   AST 28 08/27/2023 1508   ALT 37 08/27/2023 1508   ALKPHOS 73 08/27/2023 1508   BILITOT 1.3 (H)  08/27/2023 1508   GFRNONAA 38 (L) 08/23/2023 0204   GFRAA 48 (L) 08/30/2020 1604     Assessment/Plan:   77 year old male history of A-fib (on Eliquis), HFrEF (EF 30-35%), RA recently started on methotrexate and prednisone, stroke, diabetes, presenting for hospital follow-up for symptomatic anemia and GI bleed with EGD showing erosive gastritis likely exacerbated by history of Eliquis use now on oral iron with improving hemoglobin and no further overt bleeding.  Scheduled for visit with cardiologist 12/10.  Anemia of chronic disease GI bleed Erosive gastritis Recent labs 3 days ago show improving hemoglobin.  No further melena.  Likely secondary to erosive gastritis. - Continue PPI twice daily until January 24 (totaling 8 weeks) and then go down to daily - Continue to follow-up with cardiovascular Center for worsening heart failure - Follow-up in 6 months (or sooner if new or worsening symptoms).  HFrEF Ejection fraction 30 to 35%.  Recent hospitalization with elevated BNP and worsening heart failure.  Scheduled to see cardiologist.  Paroxysmal A-fib He is currently back on his Eliquis.  His Eliquis likely exacerbated his GI bleed.  Discussed that while being on Eliquis is beneficial for his A-fib but will put him at risk for bleeding in the future.  He is aware of this.  Currently hemoglobin is stable/improving.  Lara Mulch [facility] 08/30/2023, 2:54 PM  Cc: Mechele Claude, MD  Primary  gastroenterology attending:  I have reviewed Ms. Hershy Flenner's note and the patient records.  I agree with her plan.  I think his iron studies are more consistent with anemia of chronic disease as opposed to iron deficiency anemia.  I suppose it is possible transfusion altered these.  He has a longstanding chronic normocytic anemia however and I think the balance of evidence favors that.  Continue with care plan as above.  Iva Boop, MD, [facility]

## 2023-09-02 ENCOUNTER — Telehealth: Payer: Self-pay | Admitting: Family Medicine

## 2023-09-02 DIAGNOSIS — I480 Paroxysmal atrial fibrillation: Secondary | ICD-10-CM | POA: Diagnosis not present

## 2023-09-02 DIAGNOSIS — I959 Hypotension, unspecified: Secondary | ICD-10-CM | POA: Diagnosis not present

## 2023-09-02 DIAGNOSIS — Z7901 Long term (current) use of anticoagulants: Secondary | ICD-10-CM | POA: Diagnosis not present

## 2023-09-02 DIAGNOSIS — D500 Iron deficiency anemia secondary to blood loss (chronic): Secondary | ICD-10-CM | POA: Diagnosis not present

## 2023-09-02 DIAGNOSIS — I130 Hypertensive heart and chronic kidney disease with heart failure and stage 1 through stage 4 chronic kidney disease, or unspecified chronic kidney disease: Secondary | ICD-10-CM | POA: Diagnosis not present

## 2023-09-02 DIAGNOSIS — E785 Hyperlipidemia, unspecified: Secondary | ICD-10-CM | POA: Diagnosis not present

## 2023-09-02 DIAGNOSIS — K922 Gastrointestinal hemorrhage, unspecified: Secondary | ICD-10-CM | POA: Diagnosis not present

## 2023-09-02 DIAGNOSIS — I5020 Unspecified systolic (congestive) heart failure: Secondary | ICD-10-CM | POA: Diagnosis not present

## 2023-09-02 DIAGNOSIS — N179 Acute kidney failure, unspecified: Secondary | ICD-10-CM | POA: Diagnosis not present

## 2023-09-02 DIAGNOSIS — M109 Gout, unspecified: Secondary | ICD-10-CM | POA: Diagnosis not present

## 2023-09-02 DIAGNOSIS — J9610 Chronic respiratory failure, unspecified whether with hypoxia or hypercapnia: Secondary | ICD-10-CM | POA: Diagnosis not present

## 2023-09-02 DIAGNOSIS — Z7984 Long term (current) use of oral hypoglycemic drugs: Secondary | ICD-10-CM | POA: Diagnosis not present

## 2023-09-02 DIAGNOSIS — N1831 Chronic kidney disease, stage 3a: Secondary | ICD-10-CM | POA: Diagnosis not present

## 2023-09-02 DIAGNOSIS — M069 Rheumatoid arthritis, unspecified: Secondary | ICD-10-CM | POA: Diagnosis not present

## 2023-09-02 DIAGNOSIS — I255 Ischemic cardiomyopathy: Secondary | ICD-10-CM | POA: Diagnosis not present

## 2023-09-02 DIAGNOSIS — E1122 Type 2 diabetes mellitus with diabetic chronic kidney disease: Secondary | ICD-10-CM | POA: Diagnosis not present

## 2023-09-02 NOTE — Telephone Encounter (Signed)
TC back to Stacy w/ [facility] VO given for PT frequency

## 2023-09-02 NOTE — Telephone Encounter (Signed)
Copied from CRM [id number]. Topic: Clinical - Home Health Verbal Orders >> Sep 02, 2023  4:14 PM Fuller Mandril wrote: Caller/Agency: [facility] Callback Number: [phone] voice mail confindetial able to leave voice mail  Service Requested: Need verbal approval for Home Health starting today  Frequency:  2 times week for 2 weeks, 1 time per week for 6 weeks.  Any new concerns about the patient? N/A

## 2023-09-03 ENCOUNTER — Other Ambulatory Visit: Payer: Self-pay | Admitting: *Deleted

## 2023-09-03 ENCOUNTER — Ambulatory Visit (HOSPITAL_COMMUNITY)
Admission: RE | Admit: 2023-09-03 | Discharge: 2023-09-03 | Disposition: A | Discharge status: Home / Self Care | Payer: Medicare Other | Source: Ambulatory Visit | Attending: Adult Health | Admitting: Adult Health

## 2023-09-03 ENCOUNTER — Other Ambulatory Visit (HOSPITAL_COMMUNITY): Payer: Self-pay

## 2023-09-03 ENCOUNTER — Encounter (HOSPITAL_COMMUNITY): Payer: Self-pay

## 2023-09-03 ENCOUNTER — Telehealth (HOSPITAL_COMMUNITY): Payer: Self-pay

## 2023-09-03 VITALS — BP 132/68 | HR 78 | Wt 192.0 lb

## 2023-09-03 DIAGNOSIS — I130 Hypertensive heart and chronic kidney disease with heart failure and stage 1 through stage 4 chronic kidney disease, or unspecified chronic kidney disease: Secondary | ICD-10-CM | POA: Insufficient documentation

## 2023-09-03 DIAGNOSIS — I480 Paroxysmal atrial fibrillation: Secondary | ICD-10-CM | POA: Diagnosis not present

## 2023-09-03 DIAGNOSIS — Z823 Family history of stroke: Secondary | ICD-10-CM | POA: Diagnosis not present

## 2023-09-03 DIAGNOSIS — I739 Peripheral vascular disease, unspecified: Secondary | ICD-10-CM | POA: Diagnosis not present

## 2023-09-03 DIAGNOSIS — I6522 Occlusion and stenosis of left carotid artery: Secondary | ICD-10-CM | POA: Diagnosis not present

## 2023-09-03 DIAGNOSIS — Z79899 Other long term (current) drug therapy: Secondary | ICD-10-CM | POA: Diagnosis not present

## 2023-09-03 DIAGNOSIS — Z7901 Long term (current) use of anticoagulants: Secondary | ICD-10-CM | POA: Diagnosis not present

## 2023-09-03 DIAGNOSIS — I4819 Other persistent atrial fibrillation: Secondary | ICD-10-CM | POA: Insufficient documentation

## 2023-09-03 DIAGNOSIS — Z8673 Personal history of transient ischemic attack (TIA), and cerebral infarction without residual deficits: Secondary | ICD-10-CM | POA: Insufficient documentation

## 2023-09-03 DIAGNOSIS — I350 Nonrheumatic aortic (valve) stenosis: Secondary | ICD-10-CM | POA: Diagnosis not present

## 2023-09-03 DIAGNOSIS — I5022 Chronic systolic (congestive) heart failure: Secondary | ICD-10-CM | POA: Diagnosis not present

## 2023-09-03 DIAGNOSIS — Z8249 Family history of ischemic heart disease and other diseases of the circulatory system: Secondary | ICD-10-CM | POA: Diagnosis not present

## 2023-09-03 DIAGNOSIS — N183 Chronic kidney disease, stage 3 unspecified: Secondary | ICD-10-CM | POA: Diagnosis not present

## 2023-09-03 DIAGNOSIS — I5020 Unspecified systolic (congestive) heart failure: Secondary | ICD-10-CM

## 2023-09-03 LAB — CBC
HCT: 33.3 % — ABNORMAL LOW (ref 39.0–52.0)
Hemoglobin: 10.7 g/dL — ABNORMAL LOW (ref 13.0–17.0)
MCH: 31.3 pg (ref 26.0–34.0)
MCHC: 32.1 g/dL (ref 30.0–36.0)
MCV: 97.4 fL (ref 80.0–100.0)
Platelets: 186 10*3/uL (ref 150–400)
RBC: 3.42 MIL/uL — ABNORMAL LOW (ref 4.22–5.81)
RDW: 18.1 % — ABNORMAL HIGH (ref 11.5–15.5)
WBC: 7.5 10*3/uL (ref 4.0–10.5)
nRBC: 0 % (ref 0.0–0.2)

## 2023-09-03 LAB — BASIC METABOLIC PANEL
Anion gap: 10 (ref 5–15)
BUN: 21 mg/dL (ref 8–23)
CO2: 27 mmol/L (ref 22–32)
Calcium: 8.5 mg/dL — ABNORMAL LOW (ref 8.9–10.3)
Chloride: 107 mmol/L (ref 98–111)
Creatinine, Ser: 1.76 mg/dL — ABNORMAL HIGH (ref 0.61–1.24)
GFR, Estimated: 39 mL/min — ABNORMAL LOW (ref >60)
Glucose, Bld: 123 mg/dL — ABNORMAL HIGH (ref 70–99)
Potassium: 3.9 mmol/L (ref 3.5–5.1)
Sodium: 144 mmol/L (ref 135–145)

## 2023-09-03 LAB — FERRITIN: Ferritin: 673 ng/mL — ABNORMAL HIGH (ref 24–336)

## 2023-09-03 LAB — IRON AND TIBC
Iron: 38 ug/dL — ABNORMAL LOW (ref 45–182)
Saturation Ratios: 17 % — ABNORMAL LOW (ref 17.9–39.5)
TIBC: 231 ug/dL — ABNORMAL LOW (ref 250–450)
UIBC: 193 ug/dL

## 2023-09-03 LAB — TSH: TSH: 3.985 u[IU]/mL (ref 0.350–4.500)

## 2023-09-03 LAB — BRAIN NATRIURETIC PEPTIDE: B Natriuretic Peptide: 1602.8 pg/mL — ABNORMAL HIGH (ref 0.0–100.0)

## 2023-09-03 MED ORDER — DAPAGLIFLOZIN PROPANEDIOL 10 MG PO TABS: 10.0000 mg | ORAL_TABLET | Freq: Every day | ORAL | 11 refills | Status: DC

## 2023-09-03 MED ORDER — SPIRONOLACTONE 25 MG PO TABS: 12.5000 mg | ORAL_TABLET | Freq: Every day | ORAL | 3 refills | Status: DC

## 2023-09-03 NOTE — Patient Outreach (Signed)
Care Management  Transitions of Care Program Transitions of Care Post-discharge week 2   09/03/2023 Name: Connor Bryant MRN: [id number] DOB: 07/03/1946  Subjective: Connor Bryant is a 77 y.o. year old male who is a primary care patient of Stacks, Broadus John, MD. The Care Management team Engaged with patient Engaged with patient by telephone to assess and address transitions of care needs.   Consent to Services:  Patient was given information about care management services, agreed to services, and gave verbal consent to participate.   Assessment: Patient feels he is doing well, would like increased activity tolerance and hopes working with home health PT will help with this as well as balance, endurance.  Patient still not able to successfully stand on the scales without fear of falling. Patient reports he has all medications and taking as prescribed, son continues to assist pt as needed.        SDOH Interventions    Flowsheet Row Telephone from 08/26/2023 in [place] POPULATION HE[facility] DEPARTMENT ED to Hosp-Admission (Discharged) from 08/17/2023 in [facility] [facility] CV PROGRESSIVE CARE Office Visit from 06/25/2023 in [facility] Clinical Support from 12/03/2022 in [facility] Clinical Support from 11/27/2021 in [facility] Office Visit from 03/02/2021 in [facility]  SDOH Interventions        Food Insecurity Interventions Intervention Not Indicated -- -- Intervention Not Indicated Intervention Not Indicated --  Housing Interventions Intervention Not Indicated -- -- Intervention Not Indicated Intervention Not Indicated --  Transportation Interventions Intervention Not Indicated -- -- Intervention Not Indicated Intervention Not Indicated --  Utilities Interventions Intervention Not Indicated -- -- Intervention Not Indicated -- --  Alcohol Usage Interventions --  Intervention Not Indicated (Score <7) -- Intervention Not Indicated (Score <7) -- --  Depression Interventions/Treatment  -- -- WUJ8-1 Score <4 Follow-up Not Indicated -- -- PHQ2-9 Score <4 Follow-up Not Indicated  Financial Strain Interventions -- Intervention Not Indicated -- Intervention Not Indicated Intervention Not Indicated --  Physical Activity Interventions -- -- -- Intervention Not Indicated Intervention Not Indicated --  Stress Interventions -- -- -- Intervention Not Indicated Intervention Not Indicated --  Social Connections Interventions -- -- -- Intervention Not Indicated Intervention Not Indicated --        Goals Addressed             This Visit's Progress    Transitions of Care/ pt will have no readmission within 30 days       Current Barriers:  Chronic Disease Management support and education needs related to CHF and GI bleed  Patient reports doing well since discharge, has "no GI bleeding and never did have any"  pt states " they are not sure where bleeding is coming from"  pt states he lives alone, has son that checks in on him regularly and assists with transportation Patient reports he has a scale and will start weighing again when he is more stable on his feet, has walker he uses regularly Patient reports physical therapist came to his home, completed evaluation and will be working with him, hopefully this will help balance so pt can stand on the scales and work on endurance, activity tolerance as pt continues to have dyspnea with exertion. Patient reports he has no LE edema when he first wakes up but does have some edema as the day goes on and he is up on his feet. Patient saw primary  care provider on 12/3, to see cardiology today 12/10, will follow up with rheumatologist 10/07/23 as a new patient  RNCM Clinical Goal(s):  Patient will work with the Care Management team over the next 30 days to address Transition of Care Barriers: Home Health services verbalize  understanding of plan for management of CHF and GI bleed as evidenced by patient report, review of EMR take all medications exactly as prescribed and will call provider for medication related questions as evidenced by patient report, refill history and review of EMR attend all scheduled medical appointments: primary care provider, GI and cardiology as evidenced by patient report, review of EMR  through collaboration with RN Care manager, provider, and care team.   Interventions: Evaluation of current treatment plan related to  self management and patient's adherence to plan as established by provider   Heart Failure Interventions:  (Status:  New goal. and Goal on track:  Yes.) Short Term Goal Basic overview and discussion of pathophysiology of Heart Failure reviewed Assessed need for readable accurate scales in home Provided education about placing scale on hard, flat surface Advised patient to weigh each morning after emptying bladder Discussed importance of daily weight and advised patient to weigh and record daily Discussed the importance of keeping all appointments with provider Assessed social determinant of health barriers  Reinforced HF action plan, importance of calling provider early on for change in health status/ symptoms Reviewed medications Reviewed all upcoming scheduled appointments including cardiology today 09/03/23.  GI Bleed: Reinforced signs/ symptoms GI bleeding Reinforced importance of reporting any s/s GI bleeding to provider Reinforced low sodium diet Verified home health is in place  Patient Goals/Self-Care Activities: Participate in Transition of Care Program/Attend [facility] scheduled calls Notify RN Care Manager of TOC call rescheduling needs Take all medications as prescribed Attend all scheduled provider appointments Call pharmacy for medication refills 3-7 days in advance of running out of medications Call provider office for new concerns or questions  call  office if I gain more than 2 pounds in one day or 5 pounds in one week keep legs up while sitting use salt in moderation watch for swelling in feet, ankles and legs every day weigh myself daily develop a rescue plan follow rescue plan if symptoms flare-up eat more whole grains, fruits and vegetables, lean meats and healthy fats dress right for the weather, hot or cold Report any signs/ symptoms of GI bleeding to your doctor Continue working with home health PT, complete exercises they give you to do  Follow Up Plan:  Telephone follow up appointment with care management team member scheduled for:  09/10/23 at 11 am          Plan: Telephone follow up appointment with care management team member scheduled for: The patient has been provided with contact information for the care management team and has been advised to call with any health related questions or concerns.   Irving Shows [facility], BSN RN Care Manager/ Transition of Care  [employer] [phone]

## 2023-09-03 NOTE — Telephone Encounter (Signed)
Advanced Heart Failure Patient Advocate Encounter  The patient was approved for a [employer] grant that will help cover the cost of Carvedilol, Farxiga.  Total amount awarded, $10,000.  Effective: 08/04/2023 - 08/02/2024.  BIN [id number] PCN [id number] [id number] ID [id number]  Pharmacy provided with approval and processing information. Patient informed in office.  Burnell Blanks, CPhT Rx Patient Advocate Phone: [phone]

## 2023-09-03 NOTE — Patient Instructions (Addendum)
Labs done today. We will contact you only if your labs are abnormal.  START Farxiga 10mg  (1 tablet) by mouth daily.  START Lasix 20mg  (1 tablet) by mouth daily.   START Spironolactone 12.5mg  (1/2 tablet) by mouth daily.  START Amiodarone 400mg  (2 tablets) by mouth daily for 1 week THEN DECREASE to 200mg  (1 tablet) by mouth daily.   No other medication changes were made. Please continue all current medications as prescribed.  Your physician recommends that you schedule a follow-up appointment in: 1 month with Dr. Shirlee Latch  If you have any questions or concerns before your next appointment please send [place] a message through [facility] or call our office at [phone].    TO LEAVE A MESSAGE FOR THE NURSE SELECT OPTION 2, PLEASE LEAVE A MESSAGE INCLUDING: YOUR NAME DATE OF BIRTH CALL BACK NUMBER REASON FOR CALL**this is important as we prioritize the call backs  YOU WILL RECEIVE A CALL BACK THE SAME DAY AS LONG AS YOU CALL BEFORE 4:00 PM   Do the following things EVERYDAY: Weigh yourself in the morning before breakfast. Write it down and keep it in a log. Take your medicines as prescribed Eat low salt foods--Limit salt (sodium) to 2000 mg per day.  Stay as active as you can everyday Limit all fluids for the day to less than 2 liters   At the [facility], you and your health needs are our priority. As part of our continuing mission to provide you with exceptional heart care, we have created designated Provider Care Teams. These Care Teams include your primary Cardiologist (physician) and Advanced Practice Providers (APPs- Physician Assistants and Nurse Practitioners) who all work together to provide you with the care you need, when you need it.   You may see any of the following providers on your designated Care Team at your next follow up: Dr Arvilla Meres Dr Marca Ancona Dr. Marcos Eke, NP Robbie Lis, [place] [facility] [place],  [place] Brynda Peon, NP Karle Plumber, PharmD   Please be sure to bring in all your medications bottles to every appointment.    Thank you for choosing [facility]     You are scheduled for a Cardiac Catheterization on Wednesday, December 18 with Dr. Marca Ancona.  1. Please arrive at the [facility] (Main Entrance A) at [facility]: [address] [place], [place] [address] at 10:30 AM (This time is 2 and 1/2 hours before your procedure to ensure your preparation).   Free valet parking service is available. You will check in at ADMITTING. The support person will be asked to wait in the waiting room.  It is OK to have someone drop you off and come back when you are ready to be discharged.    Special note: Every effort is made to have your procedure done on time. Please understand that emergencies sometimes delay scheduled procedures.  2. Diet: Do not eat solid foods after midnight.  The patient may have clear liquids until 5am upon the day of the procedure.  3. Medication instructions in preparation for your procedure:  HOLD YOUR FARXIGA 3 DAYS BEFORE YOUR PROCEDURE  HOLD ELIQUIS THE DAY BEFORE AND THE DAY OF YOUR PROCEDURE.   HOLD YOUR LASIX AND SPIRONOLACTONE THE DAY OF YOUR PROCEDURE.   Do not take Diabetes Med Glimepiride on the day of the procedure and HOLD 48 HOURS AFTER THE PROCEDURE.  On the morning of your procedure, take  any morning medicines NOT listed above.  You may use sips of water.  4. Plan to go home the same day, you will only stay overnight if medically necessary. 5. Bring a current list of your medications and current insurance cards. 6. You MUST have a responsible person to drive you home. 7. Someone MUST be with you the first 24 hours after you arrive home or your discharge will be delayed. 8. Please wear clothes that are easy to get on and off and wear slip-on shoes.  Thank you for allowing [place] to care for you!    -- [facility]

## 2023-09-03 NOTE — H&P (View-Only) (Signed)
 PCP: Mechele Claude, MD Cardiology: Dr. Wyline Mood HF Cardiology: Dr. Shirlee Latch  77 y.o. with history of CVA, persistent atrial fibrillation, and systolic CHF was referred by Dr. Wyline Mood for evaluation of CHF.  Patient was admitted in 8/21 with CVA. HS-TnI elevated at 2500, echo showed EF 35% with apical akinesis.  This admission was complicated by PE, atrial fibrillation, AKI, anemia.  Cath was not done with anemia and AKI.  However, echo in 10/21 showed EF back up to 60-65% with mild-moderate AS.  It was suspected that the fall in EF was due to a stress cardiomyopathy given improvement. Echo in 7/23 showed EF 60-65%, moderate AS with mean gradient 28 mmHg, AVA 1.2 cm^2 by VTI.    In 11/24, he was admitted to [facility] with anemia and hypotension, melena was present with hgb 7.5.  Creatinine up to 2.39.  He had 2 units PRBCs, and since still hypotensive, he was transferred to [facility]. Echo showed EF 30-35%, diffuse hypokinesis, normal RV, PASP 60 mmHg, mild-moderate MR, moderate AS. He was was in atrial fibrillation during this admission.  Low BP limited GDMT titration.  Amiodarone was begun for rate control of AF.  EGD was done showing erosive gastritis.  Eliquis was restarted at discharge.   Patient remains dyspneic walking 25-30 feet.  He is getting PT and uses a walker. No orthopnea/PND.  He is not on Lasix.  No chest pain.  He denies claudication or pedal ulcerations.  No further BRBPR or melena.  No lightheadedness. He is still in atrial fibrillation.   ECG (personally reviewed): Atrial fibrillation rate 65, nonspecific T wave changes  Labs (12/24): K 4.4, creatinine 1.7, hgb 10.9, LFTs normal  PMH: 1. CVA: In 8/21, in setting of atrial fibrillation.  2. Type 2 diabetes 3. HTN 4. Hyperlipidemia 5. PAD: Mesenteric artery bypass in 6/15.  Bilateral iliac artery stents in 8/21 at [facility].  6. Carotid stenosis: Carotid dopplers in 7/23 with 40-59% LICA stenosis.  7. CKD stage 3 8. GI bleeding:  11/24, erosive gastritis.  9. Chronic systolic CHF: Echo in 8/21 in setting of CVA showed EF 35% with apical akinesis.  Repeat echo in 10/21 showed EF up to 60-65%, mild-moderate AS.  Suspect fall in EF in 8/21 was due to stress cardiomyopathy in setting of CVA.  - Echo (7/23): EF 60-65%, moderate AS with mean gradient 28 mmHg, AVA 1.2 cm^2 by VTI.  - Echo (11/24) in setting of anemia and hypotension showed EF 30-35%, diffuse hypokinesis, normal RV, PASP 60 mmHg, mild-moderate MR, moderate AS.  10. Aortic stenosis: Moderate on last echo in 11/24.  11. PE: 8/21.   Family History  Problem Relation Age of Onset   Diabetes Mother    Heart disease Mother        before age 29   Hyperlipidemia Mother    Hypertension Mother    AAA (abdominal aortic aneurysm) Mother    Stroke Father    Hypertension Father    Coronary artery disease Father    Hyperlipidemia Father    Diabetes Father    Bladder Cancer Father    Cancer Father    Heart disease Father    Heart attack Father    Colon cancer Neg Hx    Throat cancer Neg Hx    Kidney disease Neg Hx    Liver disease Neg Hx    Social History   Socioeconomic History   Marital status: Widowed    Spouse name: Not on file  Number of children: 1   Years of education: Not on file   Highest education level: High school graduate  Occupational History   Occupation: Retired  Tobacco Use   Smoking status: Former    Current packs/day: 0.00    Types: Cigarettes    Quit date: 07/08/2009    Years since quitting: 14.1   Smokeless tobacco: Never  Vaping Use   Vaping status: Never Used  Substance and Sexual Activity   Alcohol use: Not Currently   Drug use: Not Currently   Sexual activity: Yes  Other Topics Concern   Not on file  Social History Narrative   Patient is married, he is retired. 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   Social Determinants of Health   Financial Resource Strain: Low Risk  (08/19/2023)   Overall Financial  Resource Strain (CARDIA)    Difficulty of Paying Living Expenses: Not very hard  Food Insecurity: No Food Insecurity (08/26/2023)   Hunger Vital Sign    Worried About Running Out of Food in the Last Year: Never true    Ran Out of Food in the Last Year: Never true  Transportation Needs: No Transportation Needs (08/26/2023)   PRAPARE - Administrator, Civil Service (Medical): No    Lack of Transportation (Non-Medical): No  Physical Activity: Insufficiently Active (12/03/2022)   Exercise Vital Sign    Days of Exercise per Week: 3 days    Minutes of Exercise per Session: 30 min  Stress: No Stress Concern Present (12/03/2022)   [employer] of Occupational Health - Occupational Stress Questionnaire    Feeling of Stress : Not at all  Social Connections: Moderately Isolated (12/03/2022)   Social Connection and Isolation Panel [NHANES]    Frequency of Communication with Friends and Family: More than three times a week    Frequency of Social Gatherings with Friends and Family: More than three times a week    Attends Religious Services: Never    Database administrator or Organizations: No    Attends Banker Meetings: Never    Marital Status: Married  Catering manager Violence: Not At Risk (08/26/2023)   Humiliation, Afraid, Rape, and Kick questionnaire    Fear of Current or Ex-Partner: No    Emotionally Abused: No    Physically Abused: No    Sexually Abused: No   ROS: All systems reviewed and negative except as per HPI.   Current Outpatient Medications  Medication Sig Dispense Refill   allopurinol (ZYLOPRIM) 100 MG tablet TAKE 1 TABLET 2 TO 3 TIMES A DAY FOR GOUT (Patient taking differently: 200 mg 2 (two) times daily.) 90 tablet 0   amiodarone (PACERONE) 200 MG tablet Take 2 tablets (400 mg total) by mouth 2 (two) times daily for 12 days, THEN 1 tablet (200 mg total) daily. 78 tablet 0   apixaban (ELIQUIS) 5 MG TABS tablet Take 1 tablet (5 mg total) by mouth 2  (two) times daily. 180 tablet 3   atorvastatin (LIPITOR) 80 MG tablet Take 1 tablet (80 mg total) by mouth daily. 90 tablet 3   Blood Glucose Monitoring Suppl DEVI 1 each by Does not apply route in the morning, at noon, and at bedtime. May substitute to any manufacturer covered by patient's insurance. 1 each 0   calcium carbonate (OSCAL) 1500 (600 Ca) MG TABS tablet Take 600 mg of elemental calcium by mouth daily with breakfast.     carvedilol (COREG) 12.5 MG  tablet Take 1 tablet (12.5 mg total) by mouth 2 (two) times daily with a meal. 60 tablet 0   dapagliflozin propanediol (FARXIGA) 10 MG TABS tablet Take 1 tablet (10 mg total) by mouth daily before breakfast. 30 tablet 11   ferrous sulfate 325 (65 FE) MG EC tablet Take 325 mg by mouth daily with breakfast.     folic acid (FOLVITE) 1 MG tablet Take 1 tablet (1 mg total) by mouth daily. 90 tablet 3   glimepiride (AMARYL) 2 MG tablet TAKE ONE TABLET ONCE DAILY EVERY MORNING (Patient taking differently: Take 2 mg by mouth daily with breakfast.) 90 tablet 3   pantoprazole (PROTONIX) 40 MG tablet Take 1 tablet (40 mg total) by mouth 2 (two) times daily. 60 tablet 1   polyethylene glycol (MIRALAX / GLYCOLAX) packet Take 17 g by mouth daily. (Patient taking differently: Take 17 g by mouth as needed.) 30 packet 1   predniSONE (DELTASONE) 10 MG tablet Take 10 mg by mouth as needed.     pyridOXINE (B-6) 50 MG tablet Take 1 tablet (50 mg total) by mouth daily. 90 tablet 1   spironolactone (ALDACTONE) 25 MG tablet Take 0.5 tablets (12.5 mg total) by mouth daily. 45 tablet 3   No current facility-administered medications for this encounter.   BP 132/68   Pulse 78   Wt 87.1 kg (192 lb)   SpO2 91%   BMI 29.19 kg/m  General: NAD Neck: JVP 9-10 cm, no thyromegaly or thyroid nodule.  Lungs: Crackles at bases bilaterally.  CV: Nondisplaced PMI.  Heart regular S1/S2, no S3/S4, 3/6 SEM RUSB with clear S2.  1+ edema 1/2 to knees bilaterally.  No carotid  bruit.  Difficult to palpate pedal pulses.  Abdomen: Soft, nontender, no hepatosplenomegaly, no distention.  Skin: Intact without lesions or rashes.  Neurologic: Alert and oriented x 3.  Psych: Normal affect. Extremities: No clubbing or cyanosis.  HEENT: Normal.   Assessment/Plan: 1. Chronic systolic CHF: Drop in EF in 8/21 to 35% with apical akinesis in setting CVA with improvement to normal EF by 10/21.  This was thought to be due to stress cardiomyopathy.  Echo in 11/24 showed EF back down to EF 30-35% with diffuse hypokinesis, normal RV, PASP 60 mmHg, mild-moderate MR, moderate AS. This was in the setting of atrial fibrillation and anemia.  This could be due to stress cardiomyopathy again, also consider AF with RVR though AF is now reasonably rate-controlled.  He is volume overloaded today on exam with NYHA class III symptoms.  - Add Lasix 20 mg daily.  - Start Farxiga 10 mg daily + spironolactone 12.5 daily.  - BMET/BNP today and BMET again in 1 week.  - I will arrange for RHC/LHC next week to assess filling pressures and also assess for CAD as cause of cardiomyopathy.  We discussed risks/benefits and he agrees to procedure. Hold Eliquis day before and day of.  2. Atrial fibrillation: Persistent.  He is back on Eliquis after recent GI bleeding from erosive gastritis. He was started on amiodarone at 11/24 admission. I would like to get him back into NSR.  - Continue Eliquis 5 mg bid.  Check CBC today.  - After cath, would plan on TEE-guided DCCV the following week.  - Continue amiodarone 400 mg daily x 1 week then down to 200 mg daily.  Recent LFTs normal, check TSH today.  He will need a regular eye exam.  - If he has further GI bleeding, would consider  Watchman for him.  3. CVA: 8/21, suspect due to atrial fibrillation.   - Continue Eliquis.  4. PE: In 8/21, he is on Eliquis.  5. Aortic stenosis: AS looked moderate on 11/24 echo.  S2 can be heard clearly.  - follow over time.  6. PAD:  H/o iliac stents and mesenteric artery bypass.  No claudication or intestinal angina. No pedal ulcerations.  - Continue statin, check lipids today.  7. Carotid stenosis: Should get repeat carotid dopplers this year.  8. GI bleeding: 11/24 GI bleeding from erosive esophagitis.  - Avoid ETOH.  - Continue PPI.  - Check CBC and Fe studies today, IV Fe if low transferrin saturation.  9. CKD stage 3: Follow BMET carefully.   - Adding Farxiga as above.   Followup in 1 month with me.   Marca Ancona 09/03/2023

## 2023-09-03 NOTE — Patient Instructions (Signed)
Visit Information  Thank you for taking time to visit with me today. Please don't hesitate to contact me if I can be of assistance to you before our next scheduled telephone appointment.  Following are the goals we discussed today:   Goals Addressed             This Visit's Progress    Transitions of Care/ pt will have no readmission within 30 days       Current Barriers:  Chronic Disease Management support and education needs related to CHF and GI bleed  Patient reports doing well since discharge, has "no GI bleeding and never did have any"  pt states " they are not sure where bleeding is coming from"  pt states he lives alone, has son that checks in on him regularly and assists with transportation Patient reports he has a scale and will start weighing again when he is more stable on his feet, has walker he uses regularly Patient reports physical therapist came to his home, completed evaluation and will be working with him, hopefully this will help balance so pt can stand on the scales and work on endurance, activity tolerance as pt continues to have dyspnea with exertion. Patient reports he has no LE edema when he first wakes up but does have some edema as the day goes on and he is up on his feet. Patient saw primary care provider on 12/3, to see cardiology today 12/10, will follow up with rheumatologist 10/07/23 as a new patient  RNCM Clinical Goal(s):  Patient will work with the Care Management team over the next 30 days to address Transition of Care Barriers: Home Health services verbalize understanding of plan for management of CHF and GI bleed as evidenced by patient report, review of EMR take all medications exactly as prescribed and will call provider for medication related questions as evidenced by patient report, refill history and review of EMR attend all scheduled medical appointments: primary care provider, GI and cardiology as evidenced by patient report, review of EMR  through  collaboration with RN Care manager, provider, and care team.   Interventions: Evaluation of current treatment plan related to  self management and patient's adherence to plan as established by provider   Heart Failure Interventions:  (Status:  New goal. and Goal on track:  Yes.) Short Term Goal Basic overview and discussion of pathophysiology of Heart Failure reviewed Assessed need for readable accurate scales in home Provided education about placing scale on hard, flat surface Advised patient to weigh each morning after emptying bladder Discussed importance of daily weight and advised patient to weigh and record daily Discussed the importance of keeping all appointments with provider Assessed social determinant of health barriers  Reinforced HF action plan, importance of calling provider early on for change in health status/ symptoms Reviewed medications Reviewed all upcoming scheduled appointments including cardiology today 09/03/23.  GI Bleed: Reinforced signs/ symptoms GI bleeding Reinforced importance of reporting any s/s GI bleeding to provider Reinforced low sodium diet Verified home health is in place  Patient Goals/Self-Care Activities: Participate in Transition of Care Program/Attend [facility] scheduled calls Notify RN Care Manager of TOC call rescheduling needs Take all medications as prescribed Attend all scheduled provider appointments Call pharmacy for medication refills 3-7 days in advance of running out of medications Call provider office for new concerns or questions  call office if I gain more than 2 pounds in one day or 5 pounds in one week keep legs up while  sitting use salt in moderation watch for swelling in feet, ankles and legs every day weigh myself daily develop a rescue plan follow rescue plan if symptoms flare-up eat more whole grains, fruits and vegetables, lean meats and healthy fats dress right for the weather, hot or cold Report any signs/ symptoms of  GI bleeding to your doctor Continue working with home health PT, complete exercises they give you to do  Follow Up Plan:  Telephone follow up appointment with care management team member scheduled for:  09/10/23 at 11 am           Our next appointment is by telephone on 09/10/23 at 11 am  Please call the care guide team at [phone] if you need to cancel or reschedule your appointment.   If you are experiencing a Mental Health or Behavioral Health Crisis or need someone to talk to, please call the Suicide and Crisis Lifeline: 988 call the [place] National Suicide Prevention Lifeline: 347 597 8239 or TTY: 217 449 7024 TTY 330 868 6101) to talk to a trained counselor call 1-800-273-TALK (toll free, 24 hour hotline) go to [facility] [address], [place] [phone]) call the [facility] Crisis Line: [phone] call 911   Patient verbalizes understanding of instructions and care plan provided today and agrees to view in MyChart. Active MyChart status and patient understanding of how to access instructions and care plan via MyChart confirmed with patient.     Irving Shows [facility], BSN RN Care Manager/ Transition of Care [place]/ [facility] [phone]

## 2023-09-03 NOTE — Progress Notes (Signed)
PCP: Mechele Claude, MD Cardiology: Dr. Wyline Mood HF Cardiology: Dr. Shirlee Latch  77 y.o. with history of CVA, persistent atrial fibrillation, and systolic CHF was referred by Dr. Wyline Mood for evaluation of CHF.  Patient was admitted in 8/21 with CVA. HS-TnI elevated at 2500, echo showed EF 35% with apical akinesis.  This admission was complicated by PE, atrial fibrillation, AKI, anemia.  Cath was not done with anemia and AKI.  However, echo in 10/21 showed EF back up to 60-65% with mild-moderate AS.  It was suspected that the fall in EF was due to a stress cardiomyopathy given improvement. Echo in 7/23 showed EF 60-65%, moderate AS with mean gradient 28 mmHg, AVA 1.2 cm^2 by VTI.    In 11/24, he was admitted to [facility] with anemia and hypotension, melena was present with hgb 7.5.  Creatinine up to 2.39.  He had 2 units PRBCs, and since still hypotensive, he was transferred to [facility]. Echo showed EF 30-35%, diffuse hypokinesis, normal RV, PASP 60 mmHg, mild-moderate MR, moderate AS. He was was in atrial fibrillation during this admission.  Low BP limited GDMT titration.  Amiodarone was begun for rate control of AF.  EGD was done showing erosive gastritis.  Eliquis was restarted at discharge.   Patient remains dyspneic walking 25-30 feet.  He is getting PT and uses a walker. No orthopnea/PND.  He is not on Lasix.  No chest pain.  He denies claudication or pedal ulcerations.  No further BRBPR or melena.  No lightheadedness. He is still in atrial fibrillation.   ECG (personally reviewed): Atrial fibrillation rate 65, nonspecific T wave changes  Labs (12/24): K 4.4, creatinine 1.7, hgb 10.9, LFTs normal  PMH: 1. CVA: In 8/21, in setting of atrial fibrillation.  2. Type 2 diabetes 3. HTN 4. Hyperlipidemia 5. PAD: Mesenteric artery bypass in 6/15.  Bilateral iliac artery stents in 8/21 at [facility].  6. Carotid stenosis: Carotid dopplers in 7/23 with 40-59% LICA stenosis.  7. CKD stage 3 8. GI bleeding:  11/24, erosive gastritis.  9. Chronic systolic CHF: Echo in 8/21 in setting of CVA showed EF 35% with apical akinesis.  Repeat echo in 10/21 showed EF up to 60-65%, mild-moderate AS.  Suspect fall in EF in 8/21 was due to stress cardiomyopathy in setting of CVA.  - Echo (7/23): EF 60-65%, moderate AS with mean gradient 28 mmHg, AVA 1.2 cm^2 by VTI.  - Echo (11/24) in setting of anemia and hypotension showed EF 30-35%, diffuse hypokinesis, normal RV, PASP 60 mmHg, mild-moderate MR, moderate AS.  10. Aortic stenosis: Moderate on last echo in 11/24.  11. PE: 8/21.   Family History  Problem Relation Age of Onset   Diabetes Mother    Heart disease Mother        before age 29   Hyperlipidemia Mother    Hypertension Mother    AAA (abdominal aortic aneurysm) Mother    Stroke Father    Hypertension Father    Coronary artery disease Father    Hyperlipidemia Father    Diabetes Father    Bladder Cancer Father    Cancer Father    Heart disease Father    Heart attack Father    Colon cancer Neg Hx    Throat cancer Neg Hx    Kidney disease Neg Hx    Liver disease Neg Hx    Social History   Socioeconomic History   Marital status: Widowed    Spouse name: Not on file  Number of children: 1   Years of education: Not on file   Highest education level: High school graduate  Occupational History   Occupation: Retired  Tobacco Use   Smoking status: Former    Current packs/day: 0.00    Types: Cigarettes    Quit date: 07/08/2009    Years since quitting: 14.1   Smokeless tobacco: Never  Vaping Use   Vaping status: Never Used  Substance and Sexual Activity   Alcohol use: Not Currently   Drug use: Not Currently   Sexual activity: Yes  Other Topics Concern   Not on file  Social History Narrative   Patient is married, he is retired. 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   Social Determinants of Health   Financial Resource Strain: Low Risk  (08/19/2023)   Overall Financial  Resource Strain (CARDIA)    Difficulty of Paying Living Expenses: Not very hard  Food Insecurity: No Food Insecurity (08/26/2023)   Hunger Vital Sign    Worried About Running Out of Food in the Last Year: Never true    Ran Out of Food in the Last Year: Never true  Transportation Needs: No Transportation Needs (08/26/2023)   PRAPARE - Administrator, Civil Service (Medical): No    Lack of Transportation (Non-Medical): No  Physical Activity: Insufficiently Active (12/03/2022)   Exercise Vital Sign    Days of Exercise per Week: 3 days    Minutes of Exercise per Session: 30 min  Stress: No Stress Concern Present (12/03/2022)   [employer] of Occupational Health - Occupational Stress Questionnaire    Feeling of Stress : Not at all  Social Connections: Moderately Isolated (12/03/2022)   Social Connection and Isolation Panel [NHANES]    Frequency of Communication with Friends and Family: More than three times a week    Frequency of Social Gatherings with Friends and Family: More than three times a week    Attends Religious Services: Never    Database administrator or Organizations: No    Attends Banker Meetings: Never    Marital Status: Married  Catering manager Violence: Not At Risk (08/26/2023)   Humiliation, Afraid, Rape, and Kick questionnaire    Fear of Current or Ex-Partner: No    Emotionally Abused: No    Physically Abused: No    Sexually Abused: No   ROS: All systems reviewed and negative except as per HPI.   Current Outpatient Medications  Medication Sig Dispense Refill   allopurinol (ZYLOPRIM) 100 MG tablet TAKE 1 TABLET 2 TO 3 TIMES A DAY FOR GOUT (Patient taking differently: 200 mg 2 (two) times daily.) 90 tablet 0   amiodarone (PACERONE) 200 MG tablet Take 2 tablets (400 mg total) by mouth 2 (two) times daily for 12 days, THEN 1 tablet (200 mg total) daily. 78 tablet 0   apixaban (ELIQUIS) 5 MG TABS tablet Take 1 tablet (5 mg total) by mouth 2  (two) times daily. 180 tablet 3   atorvastatin (LIPITOR) 80 MG tablet Take 1 tablet (80 mg total) by mouth daily. 90 tablet 3   Blood Glucose Monitoring Suppl DEVI 1 each by Does not apply route in the morning, at noon, and at bedtime. May substitute to any manufacturer covered by patient's insurance. 1 each 0   calcium carbonate (OSCAL) 1500 (600 Ca) MG TABS tablet Take 600 mg of elemental calcium by mouth daily with breakfast.     carvedilol (COREG) 12.5 MG  tablet Take 1 tablet (12.5 mg total) by mouth 2 (two) times daily with a meal. 60 tablet 0   dapagliflozin propanediol (FARXIGA) 10 MG TABS tablet Take 1 tablet (10 mg total) by mouth daily before breakfast. 30 tablet 11   ferrous sulfate 325 (65 FE) MG EC tablet Take 325 mg by mouth daily with breakfast.     folic acid (FOLVITE) 1 MG tablet Take 1 tablet (1 mg total) by mouth daily. 90 tablet 3   glimepiride (AMARYL) 2 MG tablet TAKE ONE TABLET ONCE DAILY EVERY MORNING (Patient taking differently: Take 2 mg by mouth daily with breakfast.) 90 tablet 3   pantoprazole (PROTONIX) 40 MG tablet Take 1 tablet (40 mg total) by mouth 2 (two) times daily. 60 tablet 1   polyethylene glycol (MIRALAX / GLYCOLAX) packet Take 17 g by mouth daily. (Patient taking differently: Take 17 g by mouth as needed.) 30 packet 1   predniSONE (DELTASONE) 10 MG tablet Take 10 mg by mouth as needed.     pyridOXINE (B-6) 50 MG tablet Take 1 tablet (50 mg total) by mouth daily. 90 tablet 1   spironolactone (ALDACTONE) 25 MG tablet Take 0.5 tablets (12.5 mg total) by mouth daily. 45 tablet 3   No current facility-administered medications for this encounter.   BP 132/68   Pulse 78   Wt 87.1 kg (192 lb)   SpO2 91%   BMI 29.19 kg/m  General: NAD Neck: JVP 9-10 cm, no thyromegaly or thyroid nodule.  Lungs: Crackles at bases bilaterally.  CV: Nondisplaced PMI.  Heart regular S1/S2, no S3/S4, 3/6 SEM RUSB with clear S2.  1+ edema 1/2 to knees bilaterally.  No carotid  bruit.  Difficult to palpate pedal pulses.  Abdomen: Soft, nontender, no hepatosplenomegaly, no distention.  Skin: Intact without lesions or rashes.  Neurologic: Alert and oriented x 3.  Psych: Normal affect. Extremities: No clubbing or cyanosis.  HEENT: Normal.   Assessment/Plan: 1. Chronic systolic CHF: Drop in EF in 8/21 to 35% with apical akinesis in setting CVA with improvement to normal EF by 10/21.  This was thought to be due to stress cardiomyopathy.  Echo in 11/24 showed EF back down to EF 30-35% with diffuse hypokinesis, normal RV, PASP 60 mmHg, mild-moderate MR, moderate AS. This was in the setting of atrial fibrillation and anemia.  This could be due to stress cardiomyopathy again, also consider AF with RVR though AF is now reasonably rate-controlled.  He is volume overloaded today on exam with NYHA class III symptoms.  - Add Lasix 20 mg daily.  - Start Farxiga 10 mg daily + spironolactone 12.5 daily.  - BMET/BNP today and BMET again in 1 week.  - I will arrange for RHC/LHC next week to assess filling pressures and also assess for CAD as cause of cardiomyopathy.  We discussed risks/benefits and he agrees to procedure. Hold Eliquis day before and day of.  2. Atrial fibrillation: Persistent.  He is back on Eliquis after recent GI bleeding from erosive gastritis. He was started on amiodarone at 11/24 admission. I would like to get him back into NSR.  - Continue Eliquis 5 mg bid.  Check CBC today.  - After cath, would plan on TEE-guided DCCV the following week.  - Continue amiodarone 400 mg daily x 1 week then down to 200 mg daily.  Recent LFTs normal, check TSH today.  He will need a regular eye exam.  - If he has further GI bleeding, would consider  Watchman for him.  3. CVA: 8/21, suspect due to atrial fibrillation.   - Continue Eliquis.  4. PE: In 8/21, he is on Eliquis.  5. Aortic stenosis: AS looked moderate on 11/24 echo.  S2 can be heard clearly.  - follow over time.  6. PAD:  H/o iliac stents and mesenteric artery bypass.  No claudication or intestinal angina. No pedal ulcerations.  - Continue statin, check lipids today.  7. Carotid stenosis: Should get repeat carotid dopplers this year.  8. GI bleeding: 11/24 GI bleeding from erosive esophagitis.  - Avoid ETOH.  - Continue PPI.  - Check CBC and Fe studies today, IV Fe if low transferrin saturation.  9. CKD stage 3: Follow BMET carefully.   - Adding Farxiga as above.   Followup in 1 month with me.   Marca Ancona 09/03/2023

## 2023-09-04 ENCOUNTER — Telehealth (HOSPITAL_COMMUNITY): Payer: Self-pay | Admitting: Cardiology

## 2023-09-04 ENCOUNTER — Other Ambulatory Visit (HOSPITAL_COMMUNITY): Payer: Self-pay

## 2023-09-04 DIAGNOSIS — I130 Hypertensive heart and chronic kidney disease with heart failure and stage 1 through stage 4 chronic kidney disease, or unspecified chronic kidney disease: Secondary | ICD-10-CM | POA: Diagnosis not present

## 2023-09-04 DIAGNOSIS — I959 Hypotension, unspecified: Secondary | ICD-10-CM | POA: Diagnosis not present

## 2023-09-04 DIAGNOSIS — I5020 Unspecified systolic (congestive) heart failure: Secondary | ICD-10-CM | POA: Diagnosis not present

## 2023-09-04 DIAGNOSIS — M069 Rheumatoid arthritis, unspecified: Secondary | ICD-10-CM | POA: Diagnosis not present

## 2023-09-04 DIAGNOSIS — E785 Hyperlipidemia, unspecified: Secondary | ICD-10-CM | POA: Diagnosis not present

## 2023-09-04 DIAGNOSIS — I480 Paroxysmal atrial fibrillation: Secondary | ICD-10-CM | POA: Diagnosis not present

## 2023-09-04 DIAGNOSIS — N1831 Chronic kidney disease, stage 3a: Secondary | ICD-10-CM | POA: Diagnosis not present

## 2023-09-04 DIAGNOSIS — E1122 Type 2 diabetes mellitus with diabetic chronic kidney disease: Secondary | ICD-10-CM | POA: Diagnosis not present

## 2023-09-04 DIAGNOSIS — D500 Iron deficiency anemia secondary to blood loss (chronic): Secondary | ICD-10-CM | POA: Diagnosis not present

## 2023-09-04 DIAGNOSIS — K922 Gastrointestinal hemorrhage, unspecified: Secondary | ICD-10-CM | POA: Diagnosis not present

## 2023-09-04 DIAGNOSIS — Z7901 Long term (current) use of anticoagulants: Secondary | ICD-10-CM | POA: Diagnosis not present

## 2023-09-04 DIAGNOSIS — N179 Acute kidney failure, unspecified: Secondary | ICD-10-CM | POA: Diagnosis not present

## 2023-09-04 DIAGNOSIS — J9610 Chronic respiratory failure, unspecified whether with hypoxia or hypercapnia: Secondary | ICD-10-CM | POA: Diagnosis not present

## 2023-09-04 DIAGNOSIS — I255 Ischemic cardiomyopathy: Secondary | ICD-10-CM | POA: Diagnosis not present

## 2023-09-04 DIAGNOSIS — Z7984 Long term (current) use of oral hypoglycemic drugs: Secondary | ICD-10-CM | POA: Diagnosis not present

## 2023-09-04 DIAGNOSIS — M109 Gout, unspecified: Secondary | ICD-10-CM | POA: Diagnosis not present

## 2023-09-04 DIAGNOSIS — I5022 Chronic systolic (congestive) heart failure: Secondary | ICD-10-CM

## 2023-09-04 MED ORDER — FUROSEMIDE 20 MG PO TABS: 20.0000 mg | ORAL_TABLET | Freq: Every day | ORAL | 11 refills | Status: DC

## 2023-09-04 NOTE — Telephone Encounter (Signed)
New rx needed at pharmacy

## 2023-09-06 ENCOUNTER — Other Ambulatory Visit: Payer: Self-pay | Admitting: Family Medicine

## 2023-09-06 ENCOUNTER — Other Ambulatory Visit (HOSPITAL_COMMUNITY): Payer: Self-pay

## 2023-09-06 DIAGNOSIS — M1A9XX0 Chronic gout, unspecified, without tophus (tophi): Secondary | ICD-10-CM

## 2023-09-06 DIAGNOSIS — I5022 Chronic systolic (congestive) heart failure: Secondary | ICD-10-CM

## 2023-09-06 NOTE — Progress Notes (Signed)
Orders placed for R&L Heart Cath scheduled for 12/18 with Dr. Shirlee Latch

## 2023-09-10 ENCOUNTER — Encounter: Payer: Self-pay | Admitting: *Deleted

## 2023-09-10 ENCOUNTER — Other Ambulatory Visit: Payer: Self-pay | Admitting: *Deleted

## 2023-09-10 NOTE — Patient Instructions (Signed)
Visit Information  Thank you for taking time to visit with me today. Please don't hesitate to contact me if I can be of assistance to you before our next scheduled telephone appointment.  Following are the goals we discussed today:   Goals Addressed             This Visit's Progress    Transitions of Care/ pt will have no readmission within 30 days       Current Barriers:  Chronic Disease Management support and education needs related to CHF and GI bleed  Patient reports doing well since discharge, has "no GI bleeding and never did have any"  pt states " they are not sure where bleeding is coming from"  pt states he lives alone, has son that checks in on him regularly and assists with transportation, 12/17- no GI bleeding reported Patient reports he has a scale and will start weighing again when he is more stable on his feet, home health weighed pt at 189 pounds, has walker he uses regularly Patient reports physical therapist came to his home, completed evaluation and will be working with him, hopefully this will help balance so pt can stand on the scales and work on endurance, activity tolerance as pt continues to have dyspnea with exertion. Patient reports he has no LE edema when he first wakes up but does have some edema as the day goes on and he is up on his feet. Patient saw primary care provider on 12/3, saw cardiology on 12/10 and has cardiac cath scheduled for 12/18, follow up with cardiologist on 12/31 @ 9 am, will follow up with rheumatologist 10/07/23 as a new patient  RNCM Clinical Goal(s):  Patient will work with the Care Management team over the next 30 days to address Transition of Care Barriers: Home Health services verbalize understanding of plan for management of CHF and GI bleed as evidenced by patient report, review of EMR take all medications exactly as prescribed and will call provider for medication related questions as evidenced by patient report, refill history and  review of EMR attend all scheduled medical appointments: primary care provider, GI and cardiology as evidenced by patient report, review of EMR  through collaboration with RN Care manager, provider, and care team.   Interventions: Evaluation of current treatment plan related to  self management and patient's adherence to plan as established by provider   Heart Failure Interventions:  (Status:  New goal. and Goal on track:  Yes.) Short Term Goal Basic overview and discussion of pathophysiology of Heart Failure reviewed Reviewed HF action plan, importance of calling provider early on for change in health status/ symptoms Reviewed medications Reviewed all upcoming scheduled appointments   GI Bleed: Reviewed signs/ symptoms GI bleeding Reviewed importance of reporting any s/s GI bleeding to provider Reviewed low sodium diet Reviewed importance of continuing to work with home health  Patient Goals/Self-Care Activities: Participate in Transition of Care Program/Attend [facility] scheduled calls Notify RN Care Manager of TOC call rescheduling needs Take all medications as prescribed Attend all scheduled provider appointments Call pharmacy for medication refills 3-7 days in advance of running out of medications Call provider office for new concerns or questions  call office if I gain more than 2 pounds in one day or 5 pounds in one week keep legs up while sitting use salt in moderation watch for swelling in feet, ankles and legs every day weigh myself daily develop a rescue plan follow rescue plan if symptoms flare-up  eat more whole grains, fruits and vegetables, lean meats and healthy fats dress right for the weather, hot or cold Report any signs/ symptoms of GI bleeding to your doctor Continue working with home health PT, complete exercises they give you to do  Follow Up Plan:  Telephone follow up appointment with care management team member scheduled for:  09/16/23 at 10 am            Our next appointment is by telephone on 09/16/23 at 10 am  Please call the care guide team at [phone] if you need to cancel or reschedule your appointment.   If you are experiencing a Mental Health or Behavioral Health Crisis or need someone to talk to, please call the Suicide and Crisis Lifeline: 988 call the [place] National Suicide Prevention Lifeline: 226-744-9294 or TTY: 8063837995 TTY 6622962625) to talk to a trained counselor call 1-800-273-TALK (toll free, 24 hour hotline) go to [facility] [address] [place] [phone]) call the [facility] Crisis Line: [phone] call 911   Patient verbalizes understanding of instructions and care plan provided today and agrees to view in MyChart. Active MyChart status and patient understanding of how to access instructions and care plan via MyChart confirmed with patient.     Irving Shows [facility], BSN RN Care Manager/ Transition of Care [place]/ [facility] [phone]

## 2023-09-10 NOTE — Patient Outreach (Signed)
Care Management  Transitions of Care Program Transitions of Care Post-discharge week 3   09/10/2023 Name: Connor Bryant MRN: [id number] DOB: 18-Sep-1946  Subjective: Connor Bryant is a 77 y.o. year old male who is a primary care patient of Stacks, Broadus John, MD. The Care Management team Engaged with patient Engaged with patient by telephone to assess and address transitions of care needs.   Consent to Services:  Patient previously consented to services  Assessment:  patient reports he is overall doing well, states " I have A-fib but no symptoms other than some shortness of breath at times"  Patient scheduled for cardiac cath 12/18 and will follow up with cardiology on 12/31 Sharlene Dory in [place], continues to work with home health, denies any GI bleeding.        SDOH Interventions    Flowsheet Row Telephone from 08/26/2023 in [facility]PART[facility] ED to Hosp-Admission (Discharged) from 08/17/2023 in [facility] CV PROGRESSIVE CARE Office Visit from 06/25/2023 in [facility] Clinical Support from 12/03/2022 in [facility] Clinical Support from 11/27/2021 in [facility] Office Visit from 03/02/2021 in [facility]  SDOH Interventions        Food Insecurity Interventions Intervention Not Indicated -- -- Intervention Not Indicated Intervention Not Indicated --  Housing Interventions Intervention Not Indicated -- -- Intervention Not Indicated Intervention Not Indicated --  Transportation Interventions Intervention Not Indicated -- -- Intervention Not Indicated Intervention Not Indicated --  Utilities Interventions Intervention Not Indicated -- -- Intervention Not Indicated -- --  Alcohol Usage Interventions -- Intervention Not Indicated (Score <7) -- Intervention Not Indicated (Score <7) -- --  Depression Interventions/Treatment  -- -- NWG9-5  Score <4 Follow-up Not Indicated -- -- PHQ2-9 Score <4 Follow-up Not Indicated  Financial Strain Interventions -- Intervention Not Indicated -- Intervention Not Indicated Intervention Not Indicated --  Physical Activity Interventions -- -- -- Intervention Not Indicated Intervention Not Indicated --  Stress Interventions -- -- -- Intervention Not Indicated Intervention Not Indicated --  Social Connections Interventions -- -- -- Intervention Not Indicated Intervention Not Indicated --        Goals Addressed             This Visit's Progress    Transitions of Care/ pt will have no readmission within 30 days       Current Barriers:  Chronic Disease Management support and education needs related to CHF and GI bleed  Patient reports doing well since discharge, has "no GI bleeding and never did have any"  pt states " they are not sure where bleeding is coming from"  pt states he lives alone, has son that checks in on him regularly and assists with transportation, 12/17- no GI bleeding reported Patient reports he has a scale and will start weighing again when he is more stable on his feet, home health weighed pt at 189 pounds, has walker he uses regularly Patient reports physical therapist came to his home, completed evaluation and will be working with him, hopefully this will help balance so pt can stand on the scales and work on endurance, activity tolerance as pt continues to have dyspnea with exertion. Patient reports he has no LE edema when he first wakes up but does have some edema as the day goes on and he is up on his feet. Patient saw primary care provider on 12/3, saw cardiology on  12/10 and has cardiac cath scheduled for 12/18, follow up with cardiologist on 12/31 @ 9 am, will follow up with rheumatologist 10/07/23 as a new patient  RNCM Clinical Goal(s):  Patient will work with the Care Management team over the next 30 days to address Transition of Care Barriers: Home Health  services verbalize understanding of plan for management of CHF and GI bleed as evidenced by patient report, review of EMR take all medications exactly as prescribed and will call provider for medication related questions as evidenced by patient report, refill history and review of EMR attend all scheduled medical appointments: primary care provider, GI and cardiology as evidenced by patient report, review of EMR  through collaboration with RN Care manager, provider, and care team.   Interventions: Evaluation of current treatment plan related to  self management and patient's adherence to plan as established by provider   Heart Failure Interventions:  (Status:  New goal. and Goal on track:  Yes.) Short Term Goal Basic overview and discussion of pathophysiology of Heart Failure reviewed Reviewed HF action plan, importance of calling provider early on for change in health status/ symptoms Reviewed medications Reviewed all upcoming scheduled appointments   GI Bleed: Reviewed signs/ symptoms GI bleeding Reviewed importance of reporting any s/s GI bleeding to provider Reviewed low sodium diet Reviewed importance of continuing to work with home health  Patient Goals/Self-Care Activities: Participate in Transition of Care Program/Attend [facility] scheduled calls Notify RN Care Manager of TOC call rescheduling needs Take all medications as prescribed Attend all scheduled provider appointments Call pharmacy for medication refills 3-7 days in advance of running out of medications Call provider office for new concerns or questions  call office if I gain more than 2 pounds in one day or 5 pounds in one week keep legs up while sitting use salt in moderation watch for swelling in feet, ankles and legs every day weigh myself daily develop a rescue plan follow rescue plan if symptoms flare-up eat more whole grains, fruits and vegetables, lean meats and healthy fats dress right for the weather, hot or  cold Report any signs/ symptoms of GI bleeding to your doctor Continue working with home health PT, complete exercises they give you to do  Follow Up Plan:  Telephone follow up appointment with care management team member scheduled for:  09/16/23 at 10 am          Plan: Telephone follow up appointment with care management team member scheduled for:  09/16/23 at 10 am  Irving Shows [facility], BSN RN Care Manager/ Transition of Care [place] [phone]

## 2023-09-11 ENCOUNTER — Encounter (HOSPITAL_COMMUNITY)
Admission: AD | Disposition: A | Discharge status: Skilled Nursing Facility | Payer: Self-pay | Source: Home / Self Care | Attending: Cardiology

## 2023-09-11 ENCOUNTER — Inpatient Hospital Stay (HOSPITAL_COMMUNITY)
Admission: AD | Admit: 2023-09-11 | Discharge: 2023-09-27 | DRG: 323 | Disposition: A | Discharge status: Skilled Nursing Facility | Payer: Medicare Other | Attending: Cardiology | Admitting: Cardiology

## 2023-09-11 ENCOUNTER — Other Ambulatory Visit: Payer: Self-pay

## 2023-09-11 DIAGNOSIS — Z87891 Personal history of nicotine dependence: Secondary | ICD-10-CM | POA: Diagnosis not present

## 2023-09-11 DIAGNOSIS — I5043 Acute on chronic combined systolic (congestive) and diastolic (congestive) heart failure: Secondary | ICD-10-CM | POA: Diagnosis present

## 2023-09-11 DIAGNOSIS — D631 Anemia in chronic kidney disease: Secondary | ICD-10-CM | POA: Diagnosis not present

## 2023-09-11 DIAGNOSIS — Z955 Presence of coronary angioplasty implant and graft: Principal | ICD-10-CM

## 2023-09-11 DIAGNOSIS — I080 Rheumatic disorders of both mitral and aortic valves: Secondary | ICD-10-CM | POA: Diagnosis present

## 2023-09-11 DIAGNOSIS — Z9842 Cataract extraction status, left eye: Secondary | ICD-10-CM

## 2023-09-11 DIAGNOSIS — Z7982 Long term (current) use of aspirin: Secondary | ICD-10-CM

## 2023-09-11 DIAGNOSIS — I480 Paroxysmal atrial fibrillation: Secondary | ICD-10-CM | POA: Diagnosis not present

## 2023-09-11 DIAGNOSIS — I517 Cardiomegaly: Secondary | ICD-10-CM | POA: Diagnosis not present

## 2023-09-11 DIAGNOSIS — I4819 Other persistent atrial fibrillation: Secondary | ICD-10-CM | POA: Diagnosis present

## 2023-09-11 DIAGNOSIS — Z79899 Other long term (current) drug therapy: Secondary | ICD-10-CM | POA: Diagnosis not present

## 2023-09-11 DIAGNOSIS — E1151 Type 2 diabetes mellitus with diabetic peripheral angiopathy without gangrene: Secondary | ICD-10-CM | POA: Diagnosis not present

## 2023-09-11 DIAGNOSIS — R918 Other nonspecific abnormal finding of lung field: Secondary | ICD-10-CM | POA: Diagnosis not present

## 2023-09-11 DIAGNOSIS — E876 Hypokalemia: Secondary | ICD-10-CM | POA: Diagnosis present

## 2023-09-11 DIAGNOSIS — I2510 Atherosclerotic heart disease of native coronary artery without angina pectoris: Secondary | ICD-10-CM | POA: Diagnosis not present

## 2023-09-11 DIAGNOSIS — I6523 Occlusion and stenosis of bilateral carotid arteries: Secondary | ICD-10-CM | POA: Diagnosis not present

## 2023-09-11 DIAGNOSIS — Z7984 Long term (current) use of oral hypoglycemic drugs: Secondary | ICD-10-CM

## 2023-09-11 DIAGNOSIS — I69398 Other sequelae of cerebral infarction: Secondary | ICD-10-CM

## 2023-09-11 DIAGNOSIS — R54 Age-related physical debility: Secondary | ICD-10-CM | POA: Diagnosis present

## 2023-09-11 DIAGNOSIS — Z7902 Long term (current) use of antithrombotics/antiplatelets: Secondary | ICD-10-CM | POA: Diagnosis not present

## 2023-09-11 DIAGNOSIS — Z7901 Long term (current) use of anticoagulants: Secondary | ICD-10-CM

## 2023-09-11 DIAGNOSIS — I700 Atherosclerosis of aorta: Secondary | ICD-10-CM | POA: Diagnosis not present

## 2023-09-11 DIAGNOSIS — K922 Gastrointestinal hemorrhage, unspecified: Secondary | ICD-10-CM | POA: Diagnosis not present

## 2023-09-11 DIAGNOSIS — N1832 Chronic kidney disease, stage 3b: Secondary | ICD-10-CM | POA: Diagnosis present

## 2023-09-11 DIAGNOSIS — M109 Gout, unspecified: Secondary | ICD-10-CM | POA: Diagnosis not present

## 2023-09-11 DIAGNOSIS — I959 Hypotension, unspecified: Secondary | ICD-10-CM | POA: Diagnosis not present

## 2023-09-11 DIAGNOSIS — I255 Ischemic cardiomyopathy: Secondary | ICD-10-CM | POA: Diagnosis present

## 2023-09-11 DIAGNOSIS — I272 Pulmonary hypertension, unspecified: Secondary | ICD-10-CM | POA: Diagnosis not present

## 2023-09-11 DIAGNOSIS — Z8673 Personal history of transient ischemic attack (TIA), and cerebral infarction without residual deficits: Secondary | ICD-10-CM | POA: Diagnosis not present

## 2023-09-11 DIAGNOSIS — Z9841 Cataract extraction status, right eye: Secondary | ICD-10-CM

## 2023-09-11 DIAGNOSIS — R0602 Shortness of breath: Secondary | ICD-10-CM | POA: Diagnosis not present

## 2023-09-11 DIAGNOSIS — R278 Other lack of coordination: Secondary | ICD-10-CM | POA: Diagnosis not present

## 2023-09-11 DIAGNOSIS — Z8249 Family history of ischemic heart disease and other diseases of the circulatory system: Secondary | ICD-10-CM

## 2023-09-11 DIAGNOSIS — Z833 Family history of diabetes mellitus: Secondary | ICD-10-CM

## 2023-09-11 DIAGNOSIS — N179 Acute kidney failure, unspecified: Secondary | ICD-10-CM | POA: Diagnosis not present

## 2023-09-11 DIAGNOSIS — R1319 Other dysphagia: Secondary | ICD-10-CM | POA: Diagnosis not present

## 2023-09-11 DIAGNOSIS — I5023 Acute on chronic systolic (congestive) heart failure: Secondary | ICD-10-CM | POA: Diagnosis present

## 2023-09-11 DIAGNOSIS — I509 Heart failure, unspecified: Principal | ICD-10-CM | POA: Insufficient documentation

## 2023-09-11 DIAGNOSIS — I952 Hypotension due to drugs: Secondary | ICD-10-CM | POA: Diagnosis not present

## 2023-09-11 DIAGNOSIS — M0579 Rheumatoid arthritis with rheumatoid factor of multiple sites without organ or systems involvement: Secondary | ICD-10-CM | POA: Diagnosis not present

## 2023-09-11 DIAGNOSIS — Z823 Family history of stroke: Secondary | ICD-10-CM | POA: Diagnosis not present

## 2023-09-11 DIAGNOSIS — E46 Unspecified protein-calorie malnutrition: Secondary | ICD-10-CM | POA: Diagnosis present

## 2023-09-11 DIAGNOSIS — D539 Nutritional anemia, unspecified: Secondary | ICD-10-CM | POA: Diagnosis not present

## 2023-09-11 DIAGNOSIS — Z7952 Long term (current) use of systemic steroids: Secondary | ICD-10-CM

## 2023-09-11 DIAGNOSIS — I350 Nonrheumatic aortic (valve) stenosis: Secondary | ICD-10-CM | POA: Diagnosis not present

## 2023-09-11 DIAGNOSIS — I214 Non-ST elevation (NSTEMI) myocardial infarction: Secondary | ICD-10-CM | POA: Diagnosis present

## 2023-09-11 DIAGNOSIS — J99 Respiratory disorders in diseases classified elsewhere: Secondary | ICD-10-CM | POA: Diagnosis not present

## 2023-09-11 DIAGNOSIS — E1122 Type 2 diabetes mellitus with diabetic chronic kidney disease: Secondary | ICD-10-CM | POA: Diagnosis present

## 2023-09-11 DIAGNOSIS — Z7401 Bed confinement status: Secondary | ICD-10-CM | POA: Diagnosis not present

## 2023-09-11 DIAGNOSIS — Z6829 Body mass index (BMI) 29.0-29.9, adult: Secondary | ICD-10-CM

## 2023-09-11 DIAGNOSIS — I130 Hypertensive heart and chronic kidney disease with heart failure and stage 1 through stage 4 chronic kidney disease, or unspecified chronic kidney disease: Principal | ICD-10-CM | POA: Diagnosis present

## 2023-09-11 DIAGNOSIS — R5381 Other malaise: Secondary | ICD-10-CM | POA: Diagnosis not present

## 2023-09-11 DIAGNOSIS — I951 Orthostatic hypotension: Secondary | ICD-10-CM | POA: Diagnosis not present

## 2023-09-11 DIAGNOSIS — E1169 Type 2 diabetes mellitus with other specified complication: Secondary | ICD-10-CM | POA: Diagnosis not present

## 2023-09-11 DIAGNOSIS — I5022 Chronic systolic (congestive) heart failure: Secondary | ICD-10-CM | POA: Diagnosis not present

## 2023-09-11 DIAGNOSIS — Z83438 Family history of other disorder of lipoprotein metabolism and other lipidemia: Secondary | ICD-10-CM

## 2023-09-11 DIAGNOSIS — D649 Anemia, unspecified: Secondary | ICD-10-CM | POA: Diagnosis not present

## 2023-09-11 DIAGNOSIS — E785 Hyperlipidemia, unspecified: Secondary | ICD-10-CM | POA: Diagnosis present

## 2023-09-11 DIAGNOSIS — M6281 Muscle weakness (generalized): Secondary | ICD-10-CM | POA: Diagnosis not present

## 2023-09-11 DIAGNOSIS — R531 Weakness: Secondary | ICD-10-CM | POA: Diagnosis not present

## 2023-09-11 DIAGNOSIS — Z743 Need for continuous supervision: Secondary | ICD-10-CM | POA: Diagnosis not present

## 2023-09-11 DIAGNOSIS — I5021 Acute systolic (congestive) heart failure: Secondary | ICD-10-CM | POA: Diagnosis not present

## 2023-09-11 DIAGNOSIS — Z86711 Personal history of pulmonary embolism: Secondary | ICD-10-CM

## 2023-09-11 DIAGNOSIS — M4807 Spinal stenosis, lumbosacral region: Secondary | ICD-10-CM | POA: Diagnosis not present

## 2023-09-11 DIAGNOSIS — J9601 Acute respiratory failure with hypoxia: Secondary | ICD-10-CM | POA: Diagnosis not present

## 2023-09-11 HISTORY — PX: RIGHT/LEFT HEART CATH AND CORONARY ANGIOGRAPHY: CATH118266

## 2023-09-11 LAB — CBC WITH DIFFERENTIAL/PLATELET
Abs Immature Granulocytes: 0.02 10*3/uL (ref 0.00–0.07)
Basophils Absolute: 0 10*3/uL (ref 0.0–0.1)
Basophils Relative: 0 %
Eosinophils Absolute: 0.2 10*3/uL (ref 0.0–0.5)
Eosinophils Relative: 4 %
HCT: 31.7 % — ABNORMAL LOW (ref 39.0–52.0)
Hemoglobin: 10.3 g/dL — ABNORMAL LOW (ref 13.0–17.0)
Immature Granulocytes: 0 %
Lymphocytes Relative: 27 %
Lymphs Abs: 1.4 10*3/uL (ref 0.7–4.0)
MCH: 30.8 pg (ref 26.0–34.0)
MCHC: 32.5 g/dL (ref 30.0–36.0)
MCV: 94.9 fL (ref 80.0–100.0)
Monocytes Absolute: 0.4 10*3/uL (ref 0.1–1.0)
Monocytes Relative: 8 %
Neutro Abs: 3.1 10*3/uL (ref 1.7–7.7)
Neutrophils Relative %: 61 %
Platelets: 182 10*3/uL (ref 150–400)
RBC: 3.34 MIL/uL — ABNORMAL LOW (ref 4.22–5.81)
RDW: 17.3 % — ABNORMAL HIGH (ref 11.5–15.5)
WBC: 5.2 10*3/uL (ref 4.0–10.5)
nRBC: 0 % (ref 0.0–0.2)

## 2023-09-11 LAB — COMPREHENSIVE METABOLIC PANEL
ALT: 29 U/L (ref 0–44)
AST: 26 U/L (ref 15–41)
Albumin: 3 g/dL — ABNORMAL LOW (ref 3.5–5.0)
Alkaline Phosphatase: 68 U/L (ref 38–126)
Anion gap: 14 (ref 5–15)
BUN: 16 mg/dL (ref 8–23)
CO2: 28 mmol/L (ref 22–32)
Calcium: 7.7 mg/dL — ABNORMAL LOW (ref 8.9–10.3)
Chloride: 100 mmol/L (ref 98–111)
Creatinine, Ser: 1.48 mg/dL — ABNORMAL HIGH (ref 0.61–1.24)
GFR, Estimated: 48 mL/min — ABNORMAL LOW (ref >60)
Glucose, Bld: 86 mg/dL (ref 70–99)
Potassium: 2.9 mmol/L — ABNORMAL LOW (ref 3.5–5.1)
Sodium: 142 mmol/L (ref 135–145)
Total Bilirubin: 1.7 mg/dL — ABNORMAL HIGH (ref <1.2)
Total Protein: 6.1 g/dL — ABNORMAL LOW (ref 6.5–8.1)

## 2023-09-11 LAB — POCT I-STAT EG7
Acid-Base Excess: 7 mmol/L — ABNORMAL HIGH (ref 0.0–2.0)
Acid-Base Excess: 8 mmol/L — ABNORMAL HIGH (ref 0.0–2.0)
Bicarbonate: 32 mmol/L — ABNORMAL HIGH (ref 20.0–28.0)
Bicarbonate: 32.9 mmol/L — ABNORMAL HIGH (ref 20.0–28.0)
Calcium, Ion: 0.98 mmol/L — ABNORMAL LOW (ref 1.15–1.40)
Calcium, Ion: 0.98 mmol/L — ABNORMAL LOW (ref 1.15–1.40)
HCT: 30 % — ABNORMAL LOW (ref 39.0–52.0)
HCT: 31 % — ABNORMAL LOW (ref 39.0–52.0)
Hemoglobin: 10.2 g/dL — ABNORMAL LOW (ref 13.0–17.0)
Hemoglobin: 10.5 g/dL — ABNORMAL LOW (ref 13.0–17.0)
O2 Saturation: 54 %
O2 Saturation: 54 %
Potassium: 2.4 mmol/L — CL (ref 3.5–5.1)
Potassium: 2.4 mmol/L — CL (ref 3.5–5.1)
Sodium: 141 mmol/L (ref 135–145)
Sodium: 141 mmol/L (ref 135–145)
TCO2: 33 mmol/L — ABNORMAL HIGH (ref 22–32)
TCO2: 34 mmol/L — ABNORMAL HIGH (ref 22–32)
pCO2, Ven: 44.9 mm[Hg] (ref 44–60)
pCO2, Ven: 45.9 mm[Hg] (ref 44–60)
pH, Ven: 7.461 — ABNORMAL HIGH (ref 7.25–7.43)
pH, Ven: 7.463 — ABNORMAL HIGH (ref 7.25–7.43)
pO2, Ven: 27 mm[Hg] — CL (ref 32–45)
pO2, Ven: 27 mm[Hg] — CL (ref 32–45)

## 2023-09-11 LAB — TSH: TSH: 4.12 u[IU]/mL (ref 0.350–4.500)

## 2023-09-11 LAB — GLUCOSE, CAPILLARY: Glucose-Capillary: 100 mg/dL — ABNORMAL HIGH (ref 70–99)

## 2023-09-11 LAB — BRAIN NATRIURETIC PEPTIDE: B Natriuretic Peptide: 1478.1 pg/mL — ABNORMAL HIGH (ref 0.0–100.0)

## 2023-09-11 LAB — MAGNESIUM: Magnesium: 1.2 mg/dL — ABNORMAL LOW (ref 1.7–2.4)

## 2023-09-11 SURGERY — RIGHT/LEFT HEART CATH AND CORONARY ANGIOGRAPHY
Anesthesia: LOCAL

## 2023-09-11 MED ORDER — SODIUM CHLORIDE 0.9% FLUSH: 3.0000 mL | INTRAVENOUS | Status: DC | PRN

## 2023-09-11 MED ORDER — LIDOCAINE HCL (PF) 1 % IJ SOLN
INTRAMUSCULAR | Status: DC | PRN
  Administered 2023-09-11 (×2): 2 mL

## 2023-09-11 MED ORDER — HEPARIN (PORCINE) 25000 UT/250ML-% IV SOLN: 950.0000 [IU]/h | INTRAVENOUS | Status: DC

## 2023-09-11 MED ORDER — SODIUM CHLORIDE 0.9% FLUSH
3.0000 mL | Freq: Two times a day (BID) | INTRAVENOUS | Status: DC
  Administered 2023-09-11 – 2023-09-26 (×16): 3 mL via INTRAVENOUS

## 2023-09-11 MED ORDER — DAPAGLIFLOZIN PROPANEDIOL 10 MG PO TABS
10.0000 mg | ORAL_TABLET | Freq: Every day | ORAL | Status: DC
  Administered 2023-09-12 – 2023-09-14 (×3): 10 mg via ORAL
  Filled 2023-09-11 (×3): qty 1

## 2023-09-11 MED ORDER — HEPARIN (PORCINE) IN NACL 1000-0.9 UT/500ML-% IV SOLN
INTRAVENOUS | Status: DC | PRN
  Administered 2023-09-11 (×2): 500 mL

## 2023-09-11 MED ORDER — VERAPAMIL HCL 2.5 MG/ML IV SOLN
INTRAVENOUS | Status: AC
  Filled 2023-09-11: qty 2

## 2023-09-11 MED ORDER — SODIUM CHLORIDE 0.9 % IV SOLN: INTRAVENOUS | Status: DC

## 2023-09-11 MED ORDER — FERROUS SULFATE 325 (65 FE) MG PO TABS
ORAL_TABLET | Freq: Every day | ORAL | Status: DC
  Filled 2023-09-11: qty 1

## 2023-09-11 MED ORDER — CARVEDILOL 12.5 MG PO TABS
12.5000 mg | ORAL_TABLET | Freq: Two times a day (BID) | ORAL | Status: DC
  Administered 2023-09-11 – 2023-09-25 (×28): 12.5 mg via ORAL
  Filled 2023-09-11 (×29): qty 1

## 2023-09-11 MED ORDER — ACETAMINOPHEN 325 MG PO TABS
650.0000 mg | ORAL_TABLET | ORAL | Status: DC | PRN
  Filled 2023-09-11: qty 2

## 2023-09-11 MED ORDER — MIDAZOLAM HCL 2 MG/2ML IJ SOLN
INTRAMUSCULAR | Status: AC
  Filled 2023-09-11: qty 2

## 2023-09-11 MED ORDER — ONDANSETRON HCL 4 MG/2ML IJ SOLN
4.0000 mg | Freq: Four times a day (QID) | INTRAMUSCULAR | Status: DC | PRN
Start: 2023-09-11 — End: 2023-09-11

## 2023-09-11 MED ORDER — HYDRALAZINE HCL 20 MG/ML IJ SOLN: 10.0000 mg | INTRAMUSCULAR | Status: AC | PRN

## 2023-09-11 MED ORDER — MAGNESIUM SULFATE 4 GM/100ML IV SOLN
4.0000 g | Freq: Once | INTRAVENOUS | Status: AC
  Administered 2023-09-11: 4 g via INTRAVENOUS
  Filled 2023-09-11: qty 100

## 2023-09-11 MED ORDER — POTASSIUM CHLORIDE CRYS ER 20 MEQ PO TBCR: 40.0000 meq | EXTENDED_RELEASE_TABLET | ORAL | Status: DC

## 2023-09-11 MED ORDER — VERAPAMIL HCL 2.5 MG/ML IV SOLN
INTRAVENOUS | Status: DC | PRN
  Administered 2023-09-11: 10 mL via INTRA_ARTERIAL

## 2023-09-11 MED ORDER — ALLOPURINOL 100 MG PO TABS
100.0000 mg | ORAL_TABLET | Freq: Two times a day (BID) | ORAL | Status: DC
  Administered 2023-09-11 – 2023-09-27 (×32): 100 mg via ORAL
  Filled 2023-09-11 (×32): qty 1

## 2023-09-11 MED ORDER — MIDAZOLAM HCL 2 MG/2ML IJ SOLN
INTRAMUSCULAR | Status: DC | PRN
  Administered 2023-09-11: 1 mg via INTRAVENOUS

## 2023-09-11 MED ORDER — FOLIC ACID 1 MG PO TABS
1.0000 mg | ORAL_TABLET | Freq: Every day | ORAL | Status: DC
  Administered 2023-09-12 – 2023-09-27 (×16): 1 mg via ORAL
  Filled 2023-09-11 (×16): qty 1

## 2023-09-11 MED ORDER — LABETALOL HCL 5 MG/ML IV SOLN: 10.0000 mg | INTRAVENOUS | Status: AC | PRN

## 2023-09-11 MED ORDER — ATORVASTATIN CALCIUM 80 MG PO TABS
80.0000 mg | ORAL_TABLET | Freq: Every day | ORAL | Status: DC
Start: 2023-09-12 — End: 2023-09-27
  Administered 2023-09-12 – 2023-09-27 (×16): 80 mg via ORAL
  Filled 2023-09-11 (×16): qty 1

## 2023-09-11 MED ORDER — SPIRONOLACTONE 12.5 MG HALF TABLET
12.5000 mg | ORAL_TABLET | Freq: Every day | ORAL | Status: DC
  Administered 2023-09-11: 12.5 mg via ORAL
  Filled 2023-09-11: qty 1

## 2023-09-11 MED ORDER — FUROSEMIDE 10 MG/ML IJ SOLN
40.0000 mg | Freq: Two times a day (BID) | INTRAMUSCULAR | Status: DC
  Administered 2023-09-11 – 2023-09-12 (×3): 40 mg via INTRAVENOUS
  Filled 2023-09-11 (×3): qty 4

## 2023-09-11 MED ORDER — ASPIRIN 81 MG PO CHEW
81.0000 mg | CHEWABLE_TABLET | Freq: Once | ORAL | Status: AC
  Administered 2023-09-11: 81 mg via ORAL
  Filled 2023-09-11: qty 1

## 2023-09-11 MED ORDER — FENTANYL CITRATE (PF) 100 MCG/2ML IJ SOLN
INTRAMUSCULAR | Status: DC | PRN
  Administered 2023-09-11: 25 ug via INTRAVENOUS

## 2023-09-11 MED ORDER — POTASSIUM CHLORIDE CRYS ER 20 MEQ PO TBCR
40.0000 meq | EXTENDED_RELEASE_TABLET | ORAL | Status: AC
  Administered 2023-09-11 – 2023-09-12 (×2): 40 meq via ORAL
  Filled 2023-09-11 (×2): qty 2

## 2023-09-11 MED ORDER — HEPARIN SODIUM (PORCINE) 1000 UNIT/ML IJ SOLN
INTRAMUSCULAR | Status: DC | PRN
  Administered 2023-09-11: 4000 [IU] via INTRAVENOUS

## 2023-09-11 MED ORDER — IOHEXOL 350 MG/ML SOLN
INTRAVENOUS | Status: DC | PRN
  Administered 2023-09-11: 70 mL

## 2023-09-11 MED ORDER — SODIUM CHLORIDE 0.9 % IV SOLN: 250.0000 mL | INTRAVENOUS | Status: AC | PRN

## 2023-09-11 MED ORDER — ONDANSETRON HCL 4 MG/2ML IJ SOLN
4.0000 mg | Freq: Four times a day (QID) | INTRAMUSCULAR | Status: DC | PRN
Start: 2023-09-11 — End: 2023-09-27

## 2023-09-11 MED ORDER — PANTOPRAZOLE SODIUM 40 MG PO TBEC
40.0000 mg | DELAYED_RELEASE_TABLET | Freq: Two times a day (BID) | ORAL | Status: DC
  Administered 2023-09-11 – 2023-09-27 (×32): 40 mg via ORAL
  Filled 2023-09-11 (×32): qty 1

## 2023-09-11 MED ORDER — SODIUM CHLORIDE 0.9 % IV SOLN
510.0000 mg | Freq: Once | INTRAVENOUS | Status: DC
Start: 2023-09-11 — End: 2023-09-12

## 2023-09-11 MED ORDER — SODIUM CHLORIDE 0.9% FLUSH
3.0000 mL | Freq: Two times a day (BID) | INTRAVENOUS | Status: DC
  Administered 2023-09-13 – 2023-09-15 (×3): 3 mL via INTRAVENOUS

## 2023-09-11 MED ORDER — LIVING BETTER WITH HEART FAILURE BOOK: Freq: Once | Status: DC

## 2023-09-11 MED ORDER — ASPIRIN 81 MG PO CHEW
81.0000 mg | CHEWABLE_TABLET | Freq: Every day | ORAL | Status: DC
Start: 2023-09-12 — End: 2023-09-16
  Administered 2023-09-12 – 2023-09-15 (×4): 81 mg via ORAL
  Filled 2023-09-11 (×4): qty 1

## 2023-09-11 MED ORDER — HEPARIN SODIUM (PORCINE) 1000 UNIT/ML IJ SOLN
INTRAMUSCULAR | Status: AC
  Filled 2023-09-11: qty 10

## 2023-09-11 MED ORDER — ACETAMINOPHEN 325 MG PO TABS: 650.0000 mg | ORAL_TABLET | ORAL | Status: DC | PRN

## 2023-09-11 MED ORDER — AMIODARONE HCL 200 MG PO TABS
200.0000 mg | ORAL_TABLET | Freq: Every day | ORAL | Status: DC
  Administered 2023-09-11 – 2023-09-27 (×17): 200 mg via ORAL
  Filled 2023-09-11 (×17): qty 1

## 2023-09-11 MED ORDER — FENTANYL CITRATE (PF) 100 MCG/2ML IJ SOLN
INTRAMUSCULAR | Status: AC
  Filled 2023-09-11: qty 2

## 2023-09-11 MED ORDER — POLYETHYLENE GLYCOL 3350 17 G PO PACK
17.0000 g | PACK | Freq: Every day | ORAL | Status: DC
  Administered 2023-09-20: 17 g via ORAL
  Filled 2023-09-11 (×15): qty 1

## 2023-09-11 MED ORDER — DOCUSATE SODIUM 100 MG PO CAPS
100.0000 mg | ORAL_CAPSULE | Freq: Every day | ORAL | Status: DC | PRN
  Filled 2023-09-11: qty 1

## 2023-09-11 MED ORDER — HEPARIN (PORCINE) 25000 UT/250ML-% IV SOLN
950.0000 [IU]/h | INTRAVENOUS | Status: DC
  Administered 2023-09-12: 1100 [IU]/h via INTRAVENOUS
  Administered 2023-09-12: 950 [IU]/h via INTRAVENOUS
  Administered 2023-09-14: 1050 [IU]/h via INTRAVENOUS
  Administered 2023-09-15: 950 [IU]/h via INTRAVENOUS
  Filled 2023-09-11 (×4): qty 250

## 2023-09-11 MED ORDER — HEPARIN (PORCINE) 25000 UT/250ML-% IV SOLN: 12.0000 [IU]/kg/h | INTRAVENOUS | Status: DC

## 2023-09-11 MED ORDER — LIDOCAINE HCL (PF) 1 % IJ SOLN
INTRAMUSCULAR | Status: AC
  Filled 2023-09-11: qty 30

## 2023-09-11 SURGICAL SUPPLY — 12 items
CATH 5FR JL3.5 JR4 ANG PIG MP (CATHETERS) IMPLANT
CATH INFINITI 5 FR 3DRC (CATHETERS) IMPLANT
CATH INFINITI 5FR AL1 (CATHETERS) IMPLANT
CATH SWAN GANZ 7F STRAIGHT (CATHETERS) IMPLANT
DEVICE RAD COMP TR BAND LRG (VASCULAR PRODUCTS) IMPLANT
GLIDESHEATH SLEND SS 6F .021 (SHEATH) IMPLANT
GLIDESHEATH SLENDER 7FR .021G (SHEATH) IMPLANT
GUIDEWIRE .025 260CM (WIRE) IMPLANT
GUIDEWIRE INQWIRE 1.5J.035X260 (WIRE) IMPLANT
INQWIRE 1.5J .035X260CM (WIRE) ×1 IMPLANT
PACK CARDIAC CATHETERIZATION (CUSTOM PROCEDURE TRAY) ×1 IMPLANT
SET ATX-X65L (MISCELLANEOUS) IMPLANT

## 2023-09-11 NOTE — Interval H&P Note (Signed)
History and Physical Interval Note:  09/11/2023 2:19 PM  Connor Bryant  has presented today for surgery, with the diagnosis of heart failure.  The various methods of treatment have been discussed with the patient and family. After consideration of risks, benefits and other options for treatment, the patient has consented to  Procedure(s): RIGHT/LEFT HEART CATH AND CORONARY ANGIOGRAPHY (N/A) as a surgical intervention.  The patient's history has been reviewed, patient examined, no change in status, stable for surgery.  I have reviewed the patient's chart and labs.  Questions were answered to the patient's satisfaction.     Connor Bryant [employer]

## 2023-09-11 NOTE — Progress Notes (Signed)
Patient stated that transportation post procedure was in place upon arrival for procedure. Patient also stated that there would be no 24 hour person available to stay home overnight with patient post procedure. Patient was informed of the possibility of being admitted during pre-procedure in Short stay. Patient verbalized understanding.

## 2023-09-11 NOTE — Plan of Care (Signed)
  Problem: Education: Goal: Understanding of CV disease, CV risk reduction, and recovery process will improve Outcome: Progressing Goal: Individualized Educational Video(s) Outcome: Progressing   Problem: Activity: Goal: Ability to return to baseline activity level will improve Outcome: Progressing   Problem: Cardiovascular: Goal: Ability to achieve and maintain adequate cardiovascular perfusion will improve Outcome: Progressing Goal: Vascular access site(s) Level 0-1 will be maintained Outcome: Progressing   Problem: Health Behavior/Discharge Planning: Goal: Ability to safely manage health-related needs after discharge will improve Outcome: Progressing   Problem: Education: Goal: Knowledge of General Education information will improve Description: Including pain rating scale, medication(s)/side effects and non-pharmacologic comfort measures Outcome: Progressing   Problem: Health Behavior/Discharge Planning: Goal: Ability to manage health-related needs will improve Outcome: Progressing   Problem: Clinical Measurements: Goal: Ability to maintain clinical measurements within normal limits will improve Outcome: Progressing Goal: Will remain free from infection Outcome: Progressing Goal: Diagnostic test results will improve Outcome: Progressing Goal: Respiratory complications will improve Outcome: Progressing Goal: Cardiovascular complication will be avoided Outcome: Progressing   Problem: Activity: Goal: Risk for activity intolerance will decrease Outcome: Progressing   Problem: Nutrition: Goal: Adequate nutrition will be maintained Outcome: Progressing   Problem: Coping: Goal: Level of anxiety will decrease Outcome: Progressing   Problem: Elimination: Goal: Will not experience complications related to bowel motility Outcome: Progressing Goal: Will not experience complications related to urinary retention Outcome: Progressing   Problem: Pain Management: Goal:  General experience of comfort will improve Outcome: Progressing   Problem: Safety: Goal: Ability to remain free from injury will improve Outcome: Progressing   Problem: Skin Integrity: Goal: Risk for impaired skin integrity will decrease Outcome: Progressing   Problem: Education: Goal: Ability to demonstrate management of disease process will improve Outcome: Progressing Goal: Ability to verbalize understanding of medication therapies will improve Outcome: Progressing Goal: Individualized Educational Video(s) Outcome: Progressing   Problem: Activity: Goal: Capacity to carry out activities will improve Outcome: Progressing   Problem: Cardiac: Goal: Ability to achieve and maintain adequate cardiopulmonary perfusion will improve Outcome: Progressing

## 2023-09-11 NOTE — H&P (Signed)
Advanced Heart Failure Team History and Physical Note   PCP:  Mechele Claude, MD  PCP-Cardiology: Dina Rich, MD     Reason for Admission: CHF, CAD   HPI:    77 y.o. with history of CVA, persistent atrial fibrillation, and systolic CHF was initially referred by Dr. Wyline Mood for evaluation of CHF.  Patient was admitted in 8/21 with CVA. HS-TnI elevated at 2500, echo showed EF 35% with apical akinesis.  This admission was complicated by PE, atrial fibrillation, AKI, anemia.  Cath was not done with anemia and AKI.  However, echo in 10/21 showed EF back up to 60-65% with mild-moderate AS.  It was suspected that the fall in EF was due to a stress cardiomyopathy given improvement. Echo in 7/23 showed EF 60-65%, moderate AS with mean gradient 28 mmHg, AVA 1.2 cm^2 by VTI.     In 11/24, he was admitted to [facility] with anemia and hypotension, melena was present with hgb 7.5.  Creatinine up to 2.39.  He had 2 units PRBCs, and since still hypotensive, he was transferred to [facility]. Echo showed EF 30-35%, diffuse hypokinesis, normal RV, PASP 60 mmHg, mild-moderate MR, moderate AS. He was was in atrial fibrillation during this admission.  Low BP limited GDMT titration.  Amiodarone was begun for rate control of AF.  EGD was done showing erosive gastritis.  Eliquis was restarted at discharge.    I saw the patient in clinic last week.  Patient remained dyspneic walking 25-30 feet.  He has been getting PT and uses a walker. No orthopnea/PND.  He was not on Lasix.  No chest pain.  He denied claudication or pedal ulcerations.  No further BRBPR or melena.  No lightheadedness. He was still in atrial fibrillation. At this initial appointment, he looked volume overloaded and I started him on Lasix, Farxiga, and spironolactone. Hgb was stable at 10.7, creatinine stable at 1.76.  I set him up for LHC/RHC given ongoing volume overload and dyspnea as well as cardiomyopathy of uncertain etiology.  This was done today,  with findings below:   LHC/RHC today: Coronary Findings  Diagnostic Dominance: Right Left Anterior Descending  Prox LAD to Mid LAD lesion is 99% stenosed. The lesion is calcified.    Left Circumflex  Ost Cx to Prox Cx lesion is 25% stenosed. The lesion is calcified.    Right Coronary Artery  Ost RCA lesion is 80% stenosed. The lesion is calcified.  Prox RCA to Mid RCA lesion is 30% stenosed. The lesion is calcified.    Intervention   No interventions have been documented.   Right Heart  Right Heart Pressures RHC Procedural Findings: Hemodynamics (mmHg) RA mean 12 RV 55/11 PA 57/23, mean 34 PCWP mean 29 with prominent V-waves to 40  LV 143/11 AO 151/70  Oxygen saturations: PA 54% AO 95%  Cardiac Output (Fick) 4.2  Cardiac Index (Fick) 2.23 PVR 1.19 WU  Cardiac Output (Thermo) 3.83  Cardiac Index (Thermo) 2.03 PVR 1.3 WU  PAPi 2.8   Aortic valve peak-to-peak gradient 8 mmHg  Simultaneous LV/PCWP pressures were not measured, but PCWP 29 and LVEDP 11 suggests gradient across mitral valve with significant mitral stenosis.   Patient was admitted for CABG evaluation and echo to further assess his valvular heart disease.  If he is deemed too high risk for CABG, would need atherectomy/PCI to LAD and RCA.   PMH: 1. CVA: In 8/21, in setting of atrial fibrillation.  2. Type 2 diabetes 3. HTN 4.  Hyperlipidemia 5. PAD: Mesenteric artery bypass in 6/15.  Bilateral iliac artery stents in 8/21 at [facility].  6. Carotid stenosis: Carotid dopplers in 7/23 with 40-59% LICA stenosis.  7. CKD stage 3 8. GI bleeding: 11/24, erosive gastritis.  9. Chronic systolic CHF: Echo in 8/21 in setting of CVA showed EF 35% with apical akinesis.  Repeat echo in 10/21 showed EF up to 60-65%, mild-moderate AS.  Suspect fall in EF in 8/21 was due to stress cardiomyopathy in setting of CVA.  - Echo (7/23): EF 60-65%, moderate AS with mean gradient 28 mmHg, AVA 1.2 cm^2 by VTI.  - Echo  (11/24) in setting of anemia and hypotension showed EF 30-35%, diffuse hypokinesis, normal RV, PASP 60 mmHg, mild-moderate MR, moderate AS.  10. Aortic stenosis: Moderate on last echo in 11/24.  11. PE: 8/21.    Review of Systems: All systems reviewed and negative except as per HPI  Home Medications Prior to Admission medications   Medication Sig Start Date End Date Taking? Authorizing Provider  allopurinol (ZYLOPRIM) 100 MG tablet TAKE 1 TABLET 2 TO 3 TIMES A DAY FOR GOUT 09/06/23  Yes Mechele Claude, MD  amiodarone (PACERONE) 200 MG tablet Take 2 tablets (400 mg total) by mouth 2 (two) times daily for 12 days, THEN 1 tablet (200 mg total) daily. Patient taking differently: Take 400 mg daily for 3 days then take 200 mg daily  08/23/23 10/04/23 Yes Sheikh, Omair Latif, DO  apixaban (ELIQUIS) 5 MG TABS tablet Take 1 tablet (5 mg total) by mouth 2 (two) times daily. 11/20/22  Yes Mechele Claude, MD  atorvastatin (LIPITOR) 80 MG tablet Take 1 tablet (80 mg total) by mouth daily. 11/20/22  Yes Stacks, Broadus John, MD  Calcium Carb-Cholecalciferol (CALCIUM 500 + D PO) Take 1 tablet by mouth daily.   Yes [provider]  carvedilol (COREG) 12.5 MG tablet Take 1 tablet (12.5 mg total) by mouth 2 (two) times daily with a meal. 08/23/23  Yes Sheikh, Omair Latif, DO  dapagliflozin propanediol (FARXIGA) 10 MG TABS tablet Take 1 tablet (10 mg total) by mouth daily before breakfast. 09/03/23  Yes Laurey Morale, MD  Ferrous Sulfate (IRON PO) Take 1 tablet by mouth daily.   Yes [provider]  folic acid (FOLVITE) 1 MG tablet Take 1 tablet (1 mg total) by mouth daily. 08/01/23  Yes Stacks, Broadus John, MD  furosemide (LASIX) 20 MG tablet Take 1 tablet (20 mg total) by mouth daily. 09/04/23 09/03/24 Yes Laurey Morale, MD  glimepiride (AMARYL) 2 MG tablet TAKE ONE TABLET ONCE DAILY EVERY MORNING Patient taking differently: Take 2 mg by mouth daily with breakfast. 11/20/22  Yes Stacks, Broadus John, MD   polyethylene glycol (MIRALAX / GLYCOLAX) packet Take 17 g by mouth daily. Patient taking differently: Take 17 g by mouth daily as needed for moderate constipation. 10/02/18  Yes Gherghe, Daylene Katayama, MD  predniSONE (DELTASONE) 10 MG tablet Take 10 mg by mouth daily as needed (arthritis flare).   Yes [provider]  pyridOXINE (B-6) 50 MG tablet Take 1 tablet (50 mg total) by mouth daily. 08/01/23  Yes Mechele Claude, MD  spironolactone (ALDACTONE) 25 MG tablet Take 0.5 tablets (12.5 mg total) by mouth daily. 09/03/23  Yes Laurey Morale, MD  Blood Glucose Monitoring Suppl DEVI 1 each by Does not apply route in the morning, at noon, and at bedtime. May substitute to any manufacturer covered by patient's insurance. 06/25/23   Mechele Claude, MD  docusate  sodium (COLACE) 100 MG capsule Take 100 mg by mouth daily as needed for mild constipation.    [provider]  pantoprazole (PROTONIX) 40 MG tablet Take 1 tablet (40 mg total) by mouth 2 (two) times daily. 08/23/23 10/22/23  Merlene Laughter, DO    Past Surgical History: Past Surgical History:  Procedure Laterality Date   ABDOMINAL AORTAGRAM N/A 02/23/2014   Procedure: ABDOMINAL AORTAGRAM;  Surgeon: Fransisco Hertz, MD;  Location: [facility] CATH LAB;  Service: Cardiovascular;  Laterality: N/A;   BIOPSY  08/20/2023   Procedure: BIOPSY;  Surgeon: Imogene Burn, MD;  Location: [facility] ENDOSCOPY;  Service: Gastroenterology;;   CATARACT EXTRACTION, BILATERAL     COLONOSCOPY     DEBRIDEMENT TENNIS ELBOW Right    ESOPHAGOGASTRODUODENOSCOPY     ESOPHAGOGASTRODUODENOSCOPY (EGD) WITH PROPOFOL N/A 08/20/2023   Procedure: ESOPHAGOGASTRODUODENOSCOPY (EGD) WITH PROPOFOL;  Surgeon: Imogene Burn, MD;  Location: [facility] ENDOSCOPY;  Service: Gastroenterology;  Laterality: N/A;   MESENTERIC ARTERY BYPASS N/A 03/10/2014   Procedure: AORTO TO SUPERIOR MESENTERIC ARTERY BYPASS;  Surgeon: Pryor Ochoa, MD;  Location: [facility] OR;  Service: Vascular;  Laterality: N/A;    TONSILLECTOMY AND ADENOIDECTOMY     vascualr surgery     illiac stents    Family History:  Family History  Problem Relation Age of Onset   Diabetes Mother    Heart disease Mother        before age 71   Hyperlipidemia Mother    Hypertension Mother    AAA (abdominal aortic aneurysm) Mother    Stroke Father    Hypertension Father    Coronary artery disease Father    Hyperlipidemia Father    Diabetes Father    Bladder Cancer Father    Cancer Father    Heart disease Father    Heart attack Father    Colon cancer Neg Hx    Throat cancer Neg Hx    Kidney disease Neg Hx    Liver disease Neg Hx     Social History: Social History   Socioeconomic History   Marital status: Widowed    Spouse name: Not on file   Number of children: 1   Years of education: Not on file   Highest education level: High school graduate  Occupational History   Occupation: Retired  Tobacco Use   Smoking status: Former    Current packs/day: 0.00    Types: Cigarettes    Quit date: 07/08/2009    Years since quitting: 14.1   Smokeless tobacco: Never  Vaping Use   Vaping status: Never Used  Substance and Sexual Activity   Alcohol use: Not Currently   Drug use: Not Currently   Sexual activity: Yes  Other Topics Concern   Not on file  Social History Narrative   Patient is married, he is retired. 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   Social Drivers of Health   Financial Resource Strain: Low Risk  (08/19/2023)   Overall Financial Resource Strain (CARDIA)    Difficulty of Paying Living Expenses: Not very hard  Food Insecurity: No Food Insecurity (08/26/2023)   Hunger Vital Sign    Worried About Running Out of Food in the Last Year: Never true    Ran Out of Food in the Last Year: Never true  Transportation Needs: No Transportation Needs (08/26/2023)   PRAPARE - Administrator, Civil Service (Medical): No    Lack of Transportation (Non-Medical): No  Physical  Activity:  Insufficiently Active (12/03/2022)   Exercise Vital Sign    Days of Exercise per Week: 3 days    Minutes of Exercise per Session: 30 min  Stress: No Stress Concern Present (12/03/2022)   [employer] of Occupational Health - Occupational Stress Questionnaire    Feeling of Stress : Not at all  Social Connections: Moderately Isolated (12/03/2022)   Social Connection and Isolation Panel [NHANES]    Frequency of Communication with Friends and Family: More than three times a week    Frequency of Social Gatherings with Friends and Family: More than three times a week    Attends Religious Services: Never    Database administrator or Organizations: No    Attends Engineer, structural: Never    Marital Status: Married    Allergies:  No Known Allergies  Objective:    Vital Signs:   Temp:  [97.4 F (36.3 C)-97.6 F (36.4 C)] 97.6 F (36.4 C) (12/18 1553) Pulse Rate:  [0-91] 64 (12/18 1553) Resp:  [12-24] 12 (12/18 1553) BP: (138-178)/(63-94) 151/81 (12/18 1553) SpO2:  [93 %-99 %] 93 % (12/18 1553) Weight:  [74.8 kg] 74.8 kg (12/18 1051)   Filed Weights   09/11/23 1051  Weight: 74.8 kg     Physical Exam     General:  Well appearing. No respiratory difficulty HEENT: Normal Neck: Supple. JVP 8-9 cm. Carotids 2+ bilat; no bruits. No lymphadenopathy or thyromegaly appreciated. Cor: PMI nondisplaced. Irregular rate & rhythm. No rubs, gallops. 2/6 SEM RUSB with clear S2 Lungs: Clear Abdomen: Soft, nontender, nondistended. No hepatosplenomegaly. No bruits or masses. Good bowel sounds. Extremities: No cyanosis, clubbing, rash. Trace ankle edema.  Neuro: Alert & oriented x 3, cranial nerves grossly intact. moves all 4 extremities w/o difficulty. Affect pleasant.   Telemetry   Atrial fibrillation 80s (personally reviewed)   Labs     Basic Metabolic Panel: No results for input(s): "NA", "K", "CL", "CO2", "GLUCOSE", "BUN", "CREATININE", "CALCIUM", "MG", "PHOS" in the  last 168 hours.  Liver Function Tests: No results for input(s): "AST", "ALT", "ALKPHOS", "BILITOT", "PROT", "ALBUMIN" in the last 168 hours. No results for input(s): "LIPASE", "AMYLASE" in the last 168 hours. No results for input(s): "AMMONIA" in the last 168 hours.  CBC: No results for input(s): "WBC", "NEUTROABS", "HGB", "HCT", "MCV", "PLT" in the last 168 hours.  Cardiac Enzymes: No results for input(s): "CKTOTAL", "CKMB", "CKMBINDEX", "TROPONINI" in the last 168 hours.  BNP: BNP (last 3 results) Recent Labs    08/19/23 0213 09/03/23 1621  BNP 1,228.1* 1,602.8*    ProBNP (last 3 results) No results for input(s): "PROBNP" in the last 8760 hours.   CBG: Recent Labs  Lab 09/11/23 1048  GLUCAP 100*    Coagulation Studies: No results for input(s): "LABPROT", "INR" in the last 72 hours.  Imaging: CARDIAC CATHETERIZATION Result Date: 09/11/2023   Right Heart: Simultaneous LV/PCWP pressures were not measured, but PCWP 29 and LVEDP 11 suggests gradient across mitral valve with significant mitral stenosis. 1. Mildly elevated RA pressure, elevated PCWP with prominent v-waves, normal LVEDP.  Simultaneous LV/PCWP pressures were not measured, but PCWP 29 and LVEDP 11 suggests gradient across mitral valve with significant mitral stenosis.  Will need echo to reassess mitral valve. 2. Moderate pulmonary venous hypertension. 3. Low but not markedly low cardiac output. 4. Severe, heavily calcified proximal LAD stenosis after D1 and S1 (99%) with TIMI 2 flow down the distal LAD. 5. Suspect 80% heavily calcified ostial RCA stenosis.  Will admit patient, consult TCTS.  If not surgical candidate, would need PCI with atherectomy to LAD and RCA.     Assessment/Plan   1. Acute on chronic systolic CHF: Drop in EF in 8/21 to 35% with apical akinesis in setting CVA with improvement to normal EF by 10/21.  This was thought to be due to stress cardiomyopathy.  Echo in 11/24 showed EF back down to EF  30-35% with diffuse hypokinesis, normal RV, PASP 60 mmHg, mild-moderate MR, moderate AS. This was in the setting of atrial fibrillation and anemia.  I did not think that atrial fibrillation was the cause of this as rate was well-controlled (not tachycardia-mediated).  LHC/RHC today showed critical proximal LAD and severe ostial RCA stenoses, suggesting an ischemic cardiomyopathy.  Mildly elevated RA pressure, elevated PCWP with prominent v-waves, normal LVEDP.  Simultaneous LV/PCWP pressures were not measured, but PCWP 29 and LVEDP 11 suggests gradient across mitral valve with significant mitral stenosis. Moderate pulmonary venous hypertension. Low but not markedly low cardiac output (CI 2.23 Fick, 2.03 thermo).   - Gently diurese, start Lasix 40 mg IV bid later this evening. Will need BMET today.  - Continue home Farxiga and spironolactone.  - Will arrange for repeat echo to reassess LV function as well as the aortic and mitral valves (see below).  - Will have TCTS evaluate for CABG.  If deemed too high risk, will need to consider PCI with atherectomy (see below).  2. CAD: Patient has not had any chest pain.  He has had exertional dyspnea.  Echo in 11/24 showed EF down to 30-35%.  Cath today showed severe, heavily calcified proximal LAD stenosis after D1 and S1 (99%) with TIMI 2 flow down the distal LAD.  Suspect 80% heavily calcified ostial RCA stenosis. Suspect ischemic cardiomyopathy.   - TCTS to consult for CABG.  If deemed too high risk, will need to consider PCI to LAD and RCA with atherectomy.  - Continue statin.  - ASA 81 daily.  3. Atrial fibrillation: Persistent.  He is back on Eliquis after recent GI bleeding from erosive gastritis. He was started on amiodarone at 11/24 admission. I would like to get him back into NSR eventually.  - Hold Eliquis for now pending evaluation CABG.  Will use heparin gtt.  - Continue amiodarone 200 mg daily, LFTs today.  - If he has CABG, would like him to have  Maze as well. - If no CABG/Maze, will need eventual DCCV.  - If he has further GI bleeding, would consider Watchman for him.  4. CVA: 8/21, suspect due to atrial fibrillation.   5. PE: In 8/21, he is on Eliquis.  6. Aortic stenosis: AS looked moderate on 11/24 echo.  S2 can be heard clearly.  However, aortic valve was easily crossed on cath today and peak-to-peak gradient was only 8 mmHg.  - Assess aortic valve on repeat echo.  7. PAD: H/o iliac stents and mesenteric artery bypass.  No claudication or intestinal angina. No pedal ulcerations.  - Continue statin.  8. Carotid stenosis: Should get repeat carotid dopplers.   9. GI bleeding: 11/24 GI bleeding from erosive esophagitis. CBC has been stable recently with no overt bleeding.  - CBC today.  - Avoid ETOH.  10. CKD stage 3: Follow BMET carefully.   11.  Mitral stenosis: Mild mitral stenosis on 11/24 echo with mean gradient 5 mmHg.  On cath toady, simultaneous LV/PCWP pressures were not measured, but PCWP 29 and LVEDP 11 suggests  gradient across mitral valve with significant mitral stenosis.     Will need echo to reassess mitral valve.  - reassess mitral stenosis by echo.    Marca Ancona, MD 09/11/2023, 3:55 PM  Advanced Heart Failure Team Pager [phone] (M-F; 7a - 5p)  Please contact [facility] for night-coverage after hours (4p -7a ) and weekends on [email or web address]

## 2023-09-11 NOTE — Progress Notes (Addendum)
PHARMACY - ANTICOAGULATION CONSULT NOTE  Pharmacy Consult for UFH IV Infusion Indication: chest pain/ACS and atrial fibrillation  No Known Allergies  Patient Measurements: Height: 5\' 8"  (172.7 cm) Weight: 81.2 kg (179 lb) IBW/kg (Calculated) : 68.4 Heparin Dosing Weight: 81.2 kg  Vital Signs: Temp: 97.6 F (36.4 C) (12/18 1553) Temp Source: Oral (12/18 1553) BP: 151/81 (12/18 1553) Pulse Rate: 64 (12/18 1553)  Labs: No results for input(s): "HGB", "HCT", "PLT", "APTT", "LABPROT", "INR", "HEPARINUNFRC", "HEPRLOWMOCWT", "CREATININE", "CKTOTAL", "CKMB", "TROPONINIHS" in the last 72 hours.  Estimated Creatinine Clearance: 33.5 mL/min (A) (by C-G formula based on SCr of 1.76 mg/dL (H)).   Medical History: Past Medical History:  Diagnosis Date   Acute CVA (cerebrovascular accident) (HCC) 05/01/2020   Acute respiratory failure with hypoxia (HCC) 05/04/2020   Arthritis    DM type 2 (diabetes mellitus, type 2) (HCC)    no meds   Endotracheally intubated    Gallstone    Heart murmur    was told one time he had a murmur, not since 30 years ago   HTN (hypertension)    Hyperlipidemia    Personal history of colonic polyp-adenoma 11/24/2013   Renal insufficiency    mild   Thoracic spine fracture (HCC)     Medications:  Medications Prior to Admission  Medication Sig Dispense Refill Last Dose/Taking   allopurinol (ZYLOPRIM) 100 MG tablet TAKE 1 TABLET 2 TO 3 TIMES A DAY FOR GOUT 90 tablet 0 09/11/2023   amiodarone (PACERONE) 200 MG tablet Take 2 tablets (400 mg total) by mouth 2 (two) times daily for 12 days, THEN 1 tablet (200 mg total) daily. (Patient taking differently: Take 400 mg daily for 3 days then take 200 mg daily ) 78 tablet 0 09/10/2023   apixaban (ELIQUIS) 5 MG TABS tablet Take 1 tablet (5 mg total) by mouth 2 (two) times daily. 180 tablet 3 09/09/2023   atorvastatin (LIPITOR) 80 MG tablet Take 1 tablet (80 mg total) by mouth daily. 90 tablet 3 09/11/2023   Calcium  Carb-Cholecalciferol (CALCIUM 500 + D PO) Take 1 tablet by mouth daily.   Taking   carvedilol (COREG) 12.5 MG tablet Take 1 tablet (12.5 mg total) by mouth 2 (two) times daily with a meal. 60 tablet 0 09/11/2023   dapagliflozin propanediol (FARXIGA) 10 MG TABS tablet Take 1 tablet (10 mg total) by mouth daily before breakfast. 30 tablet 11 09/10/2023   Ferrous Sulfate (IRON PO) Take 1 tablet by mouth daily.   09/11/2023   folic acid (FOLVITE) 1 MG tablet Take 1 tablet (1 mg total) by mouth daily. 90 tablet 3 09/11/2023   furosemide (LASIX) 20 MG tablet Take 1 tablet (20 mg total) by mouth daily. 30 tablet 11 09/10/2023   glimepiride (AMARYL) 2 MG tablet TAKE ONE TABLET ONCE DAILY EVERY MORNING (Patient taking differently: Take 2 mg by mouth daily with breakfast.) 90 tablet 3 09/10/2023   polyethylene glycol (MIRALAX / GLYCOLAX) packet Take 17 g by mouth daily. (Patient taking differently: Take 17 g by mouth daily as needed for moderate constipation.) 30 packet 1 Taking Differently   predniSONE (DELTASONE) 10 MG tablet Take 10 mg by mouth daily as needed (arthritis flare).   Past Month   pyridOXINE (B-6) 50 MG tablet Take 1 tablet (50 mg total) by mouth daily. 90 tablet 1 09/11/2023   spironolactone (ALDACTONE) 25 MG tablet Take 0.5 tablets (12.5 mg total) by mouth daily. 45 tablet 3 09/10/2023   Blood Glucose Monitoring Suppl  DEVI 1 each by Does not apply route in the morning, at noon, and at bedtime. May substitute to any manufacturer covered by patient's insurance. 1 each 0    docusate sodium (COLACE) 100 MG capsule Take 100 mg by mouth daily as needed for mild constipation.   Unknown   pantoprazole (PROTONIX) 40 MG tablet Take 1 tablet (40 mg total) by mouth 2 (two) times daily. 60 tablet 1 Unknown   Scheduled:   [START ON 09/12/2023] allopurinol  100 mg Oral Daily   amiodarone  200 mg Oral Daily   [START ON 09/12/2023] aspirin  81 mg Oral Daily   [START ON 09/12/2023] atorvastatin  80 mg Oral  Daily   carvedilol  12.5 mg Oral BID WC   [START ON 09/12/2023] dapagliflozin propanediol  10 mg Oral QAC breakfast   [START ON 09/12/2023] folic acid  1 mg Oral Daily   furosemide  40 mg Intravenous BID   [START ON 09/12/2023] Iron   Oral Daily   Living Better with Heart Failure Book   Does not apply Once   pantoprazole  40 mg Oral BID   polyethylene glycol  17 g Oral Daily   sodium chloride flush  3 mL Intravenous Q12H   sodium chloride flush  3 mL Intravenous Q12H   spironolactone  12.5 mg Oral Daily   Infusions:   sodium chloride     sodium chloride     ferumoxytol (FERAHEME) 510 mg in sodium chloride 0.9 % 100 mL IVPB     heparin     PRN: sodium chloride, sodium chloride, acetaminophen, acetaminophen, docusate sodium, hydrALAZINE, labetalol, ondansetron (ZOFRAN) IV, ondansetron (ZOFRAN) IV, sodium chloride flush, sodium chloride flush Anti-infectives (From admission, onward)    None       Assessment: 67 YOM PMH of CVA, persistent AF, and systolic CHF. On 11/24 pt was admitted to [facility] with anemia and hypotension with melena. Pt received 2 units of pRBC and remained hypotensive. Transferred to [facility]. Underwent LHC on 12/18 with severe prox LAD stenosis (99%) and suspected heavily calcified RCA (80%). Plans to evaluate for CABG or PCI with atherectomy at this time. Prior to admission, patient was on Eliquis for [facility] for AF. No current s/sx of bleeding per RN. Will start continuous AC with UFH IV infusion. Will start UFH infusion 8 hours after sheath removal (2330)  Goal of Therapy:  Heparin level 0.3-0.7 units/ml aPTT 66-102 seconds Monitor platelets by anticoagulation protocol: Yes   Plan:  Start heparin infusion at 950 units/hr on 09/21/23 at 2330 Check aPTT and anti-Xa level in 8 hours and daily while on heparin Continue to check aPTT until anti-Xa aligns with aPTTs then switch to anti-Xa monitoring Continue to monitor H&H and platelets  Wilmer Floor, PharmD PGY2 Cardiology  Pharmacy Resident 09/11/2023,4:47 PM

## 2023-09-12 ENCOUNTER — Inpatient Hospital Stay (HOSPITAL_COMMUNITY): Payer: Medicare Other

## 2023-09-12 ENCOUNTER — Encounter (HOSPITAL_COMMUNITY): Payer: Self-pay | Admitting: Cardiology

## 2023-09-12 DIAGNOSIS — I5021 Acute systolic (congestive) heart failure: Secondary | ICD-10-CM

## 2023-09-12 DIAGNOSIS — I5023 Acute on chronic systolic (congestive) heart failure: Secondary | ICD-10-CM | POA: Diagnosis not present

## 2023-09-12 DIAGNOSIS — I2510 Atherosclerotic heart disease of native coronary artery without angina pectoris: Secondary | ICD-10-CM | POA: Diagnosis not present

## 2023-09-12 DIAGNOSIS — I6523 Occlusion and stenosis of bilateral carotid arteries: Secondary | ICD-10-CM

## 2023-09-12 DIAGNOSIS — I4819 Other persistent atrial fibrillation: Secondary | ICD-10-CM | POA: Diagnosis not present

## 2023-09-12 DIAGNOSIS — Z006 Encounter for examination for normal comparison and control in clinical research program: Secondary | ICD-10-CM

## 2023-09-12 LAB — BASIC METABOLIC PANEL
Anion gap: 12 (ref 5–15)
Anion gap: 9 (ref 5–15)
BUN: 15 mg/dL (ref 8–23)
BUN: 14 mg/dL (ref 8–23)
CO2: 27 mmol/L (ref 22–32)
CO2: 27 mmol/L (ref 22–32)
Calcium: 7.5 mg/dL — ABNORMAL LOW (ref 8.9–10.3)
Calcium: 7.9 mg/dL — ABNORMAL LOW (ref 8.9–10.3)
Chloride: 99 mmol/L (ref 98–111)
Chloride: 100 mmol/L (ref 98–111)
Creatinine, Ser: 1.46 mg/dL — ABNORMAL HIGH (ref 0.61–1.24)
Creatinine, Ser: 1.48 mg/dL — ABNORMAL HIGH (ref 0.61–1.24)
GFR, Estimated: 49 mL/min — ABNORMAL LOW (ref >60)
GFR, Estimated: 48 mL/min — ABNORMAL LOW (ref >60)
Glucose, Bld: 63 mg/dL — ABNORMAL LOW (ref 70–99)
Glucose, Bld: 111 mg/dL — ABNORMAL HIGH (ref 70–99)
Potassium: 2.9 mmol/L — ABNORMAL LOW (ref 3.5–5.1)
Potassium: 4.2 mmol/L (ref 3.5–5.1)
Sodium: 138 mmol/L (ref 135–145)
Sodium: 136 mmol/L (ref 135–145)

## 2023-09-12 LAB — APTT
aPTT: 64 s — ABNORMAL HIGH (ref 24–36)
aPTT: 92 s — ABNORMAL HIGH (ref 24–36)

## 2023-09-12 LAB — CBC
HCT: 31 % — ABNORMAL LOW (ref 39.0–52.0)
Hemoglobin: 10.2 g/dL — ABNORMAL LOW (ref 13.0–17.0)
MCH: 31 pg (ref 26.0–34.0)
MCHC: 32.9 g/dL (ref 30.0–36.0)
MCV: 94.2 fL (ref 80.0–100.0)
Platelets: 176 10*3/uL (ref 150–400)
RBC: 3.29 MIL/uL — ABNORMAL LOW (ref 4.22–5.81)
RDW: 17.2 % — ABNORMAL HIGH (ref 11.5–15.5)
WBC: 6.6 10*3/uL (ref 4.0–10.5)
nRBC: 0 % (ref 0.0–0.2)

## 2023-09-12 LAB — ECHOCARDIOGRAM COMPLETE
AR max vel: 1.09 cm2
AV Area VTI: 0.98 cm2
AV Area mean vel: 1.1 cm2
AV Mean grad: 7 mm[Hg]
AV Peak grad: 12.8 mm[Hg]
Ao pk vel: 1.79 m/s
Calc EF: 38.4 %
Height: 68 in
MV M vel: 3.87 m/s
MV Peak grad: 59.8 mm[Hg]
MV VTI: 1.16 cm2
Radius: 0.5 cm
S' Lateral: 3.7 cm
Single Plane A2C EF: 37.8 %
Single Plane A4C EF: 40.5 %
Weight: 2920.65 [oz_av]

## 2023-09-12 LAB — MAGNESIUM: Magnesium: 2.1 mg/dL (ref 1.7–2.4)

## 2023-09-12 LAB — HEPARIN LEVEL (UNFRACTIONATED): Heparin Unfractionated: >1.1 [IU]/mL — ABNORMAL HIGH (ref 0.30–0.70)

## 2023-09-12 MED ORDER — PERFLUTREN LIPID MICROSPHERE
1.0000 mL | INTRAVENOUS | Status: AC | PRN
  Administered 2023-09-12: 3 mL via INTRAVENOUS

## 2023-09-12 MED ORDER — POTASSIUM CHLORIDE CRYS ER 20 MEQ PO TBCR
60.0000 meq | EXTENDED_RELEASE_TABLET | ORAL | Status: AC
  Administered 2023-09-12 (×2): 60 meq via ORAL
  Filled 2023-09-12 (×2): qty 3

## 2023-09-12 MED ORDER — POTASSIUM CHLORIDE CRYS ER 20 MEQ PO TBCR
20.0000 meq | EXTENDED_RELEASE_TABLET | Freq: Once | ORAL | Status: AC
  Administered 2023-09-12: 20 meq via ORAL
  Filled 2023-09-12: qty 1

## 2023-09-12 MED ORDER — ENSURE ENLIVE PO LIQD
237.0000 mL | Freq: Two times a day (BID) | ORAL | Status: DC
  Administered 2023-09-19: 237 mL via ORAL

## 2023-09-12 MED ORDER — SPIRONOLACTONE 25 MG PO TABS
25.0000 mg | ORAL_TABLET | Freq: Every day | ORAL | Status: DC
  Administered 2023-09-12 – 2023-09-14 (×3): 25 mg via ORAL
  Filled 2023-09-12 (×3): qty 1

## 2023-09-12 MED ORDER — POTASSIUM CHLORIDE CRYS ER 20 MEQ PO TBCR: 60.0000 meq | EXTENDED_RELEASE_TABLET | ORAL | Status: DC

## 2023-09-12 MED ORDER — POTASSIUM CHLORIDE CRYS ER 20 MEQ PO TBCR: 40.0000 meq | EXTENDED_RELEASE_TABLET | ORAL | Status: DC

## 2023-09-12 MED ORDER — FERROUS SULFATE 325 (65 FE) MG PO TABS
325.0000 mg | ORAL_TABLET | Freq: Every day | ORAL | Status: DC
Start: 2023-09-12 — End: 2023-09-27
  Administered 2023-09-12 – 2023-09-27 (×16): 325 mg via ORAL
  Filled 2023-09-12 (×16): qty 1

## 2023-09-12 NOTE — Progress Notes (Signed)
Echocardiogram 2D Echocardiogram has been performed.  Warren Lacy Gable Odonohue RDCS 09/12/2023, 1:21 PM

## 2023-09-12 NOTE — Progress Notes (Signed)
PHARMACY - ANTICOAGULATION CONSULT NOTE  Pharmacy Consult for UFH IV Infusion Indication: chest pain/ACS and atrial fibrillation  No Known Allergies  Patient Measurements: Height: 5\' 8"  (172.7 cm) Weight: 82.8 kg (182 lb 8.7 oz) (Scale A) IBW/kg (Calculated) : 68.4 Heparin Dosing Weight: 81.2 kg  Vital Signs: Temp: 97.7 F (36.5 C) (12/19 2006) Temp Source: Oral (12/19 2006) BP: 114/65 (12/19 2006) Pulse Rate: 71 (12/19 2006)  Labs: Recent Labs    09/11/23 1433 09/11/23 1637 09/12/23 0210 09/12/23 0818 09/12/23 1135 09/12/23 1901  HGB 10.2*  10.5* 10.3* 10.2*  --   --   --   HCT 30.0*  31.0* 31.7* 31.0*  --   --   --   PLT  --  182 176  --   --   --   APTT  --   --   --  64*  --  92*  HEPARINUNFRC  --   --   --  >1.10*  --   --   CREATININE  --  1.48* 1.46*  --  1.48*  --     Estimated Creatinine Clearance: 43.2 mL/min (A) (by C-G formula based on SCr of 1.48 mg/dL (H)).   Assessment: 44 YOM PMH of CVA, persistent AF, and systolic CHF. On 11/24 pt was admitted to [facility] with anemia and hypotension with melena. Pt received 2 units of pRBC and remained hypotensive. Transferred to [facility]. Underwent LHC on 12/18 with severe prox LAD stenosis (99%) and suspected heavily calcified RCA (80%). Plans to evaluate for CABG or PCI with atherectomy at this time. Prior to admission, patient was on Eliquis for [facility] for AF. No current s/sx of bleeding per RN. Will start continuous AC with UFH IV infusion.  Heparin level came back elevated as expected >1.1 with recent DOAC. aPTT now 95 sec (therapeutic) on infusion at 1100 units/hr. No bleeding noted.  Goal of Therapy:  Heparin level 0.3-0.7 units/ml aPTT 66-102 seconds Monitor platelets by anticoagulation protocol: Yes   Plan:  Continue heparin infusion at 1100 units/hr  F/u morning aPTT, heparin level, and CBC  Thank you for allowing pharmacy to participate in this patient's care,  Christoper Fabian, PharmD, BCPS Please see amion for  complete clinical pharmacist phone list 09/12/2023 8:23 PM

## 2023-09-12 NOTE — Inpatient Diabetes Management (Signed)
Inpatient Diabetes Program Recommendations  AACE/ADA: New Consensus Statement on Inpatient Glycemic Control (2015)  Target Ranges:  Prepandial:   less than 140 mg/dL      Peak postprandial:   less than 180 mg/dL (1-2 hours)      Critically ill patients:  140 - 180 mg/dL   Lab Results  Component Value Date   GLUCAP 100 (H) 09/11/2023   HGBA1C 6.6 (H) 08/20/2023    Review of Glycemic Control  Latest Reference Range & Units 09/11/23 16:37 09/12/23 02:10  Glucose 70 - 99 mg/dL 86 63 (L)  (L): Data is abnormally low Diabetes history: Type 2 DM Outpatient Diabetes medications: Farxiga 10 mg every day, Amaryl 2 mg QD Current orders for Inpatient glycemic control: Farxiga 10 mg QD  Inpatient Diabetes Program Recommendations:    Consider adding Novolog 0-6 units TID & HS.   Thanks, Lujean Rave, MSN, RNC-OB Diabetes Coordinator [phone] (8a-5p)

## 2023-09-12 NOTE — Progress Notes (Signed)
Advanced Heart Failure Rounding Note  Cardiologist: Dina Rich, MD  Chief Complaint: Acute on Chronic Systolic Heart Failure  Subjective:    CABG eval per TCTS. Echo and Carotid studies today.   Feeling well this morning. Sitting up in the chair. No CP or SOB. On [place].   Objective:   Weight Range: 82.8 kg Body mass index is 27.76 kg/m.   Vital Signs:   Temp:  [97.4 F (36.3 C)-99 F (37.2 C)] 98.3 F (36.8 C) (12/19 0834) Pulse Rate:  [0-91] 70 (12/19 0834) Resp:  [12-24] 13 (12/19 0834) BP: (103-178)/(55-94) 103/87 (12/19 0834) SpO2:  [92 %-99 %] 97 % (12/19 0834) Weight:  [74.8 kg-82.8 kg] 82.8 kg (12/19 0533) Last BM Date :  (PTA)  Weight change: Filed Weights   09/11/23 1051 09/11/23 1553 09/12/23 0533  Weight: 74.8 kg 81.2 kg 82.8 kg    Intake/Output:   Intake/Output Summary (Last 24 hours) at 09/12/2023 1002 Last data filed at 09/12/2023 0853 Gross per 24 hour  Intake 200 ml  Output 1525 ml  Net -1325 ml   Physical Exam    General: Well appearing. No distress on RA HEENT: neck supple.   Cardiac: JVP ~9cm. AS murmur 3/6 present. No rubs. Resp: Lung sounds clear and equal B/L Abdomen: Soft, non-tender, non-distended. + BS. Extremities: Warm and dry. No rash, cyanosis.  2-3+ edema in b/l ankles.  Neuro: Alert and oriented x3. Affect pleasant. Moves all extremities without difficulty.  Telemetry   AF in 60s (personally reviewed)  EKG    Afib at 67 bpm (personally reviewed)  Labs    CBC Recent Labs    09/11/23 1637 09/12/23 0210  WBC 5.2 6.6  NEUTROABS 3.1  --   HGB 10.3* 10.2*  HCT 31.7* 31.0*  MCV 94.9 94.2  PLT 182 176   Basic Metabolic Panel Recent Labs    81/19/14 1637 09/12/23 0210 09/12/23 0818  NA 142 138  --   K 2.9* 2.9*  --   CL 100 99  --   CO2 28 27  --   GLUCOSE 86 63*  --   BUN 16 15  --   CREATININE 1.48* 1.46*  --   CALCIUM 7.7* 7.5*  --   MG 1.2*  --  2.1   Liver Function Tests Recent Labs     09/11/23 1637  AST 26  ALT 29  ALKPHOS 68  BILITOT 1.7*  PROT 6.1*  ALBUMIN 3.0*   No results for input(s): "LIPASE", "AMYLASE" in the last 72 hours. Cardiac Enzymes No results for input(s): "CKTOTAL", "CKMB", "CKMBINDEX", "TROPONINI" in the last 72 hours.  BNP: BNP (last 3 results) Recent Labs    08/19/23 0213 09/03/23 1621 09/11/23 1637  BNP 1,228.1* 1,602.8* 1,478.1*   ProBNP (last 3 results) No results for input(s): "PROBNP" in the last 8760 hours.  D-Dimer No results for input(s): "DDIMER" in the last 72 hours. Hemoglobin A1C No results for input(s): "HGBA1C" in the last 72 hours. Fasting Lipid Panel No results for input(s): "CHOL", "HDL", "LDLCALC", "TRIG", "CHOLHDL", "LDLDIRECT" in the last 72 hours. Thyroid Function Tests Recent Labs    09/11/23 1637  TSH 4.120   Other results:  Imaging   DG Chest 2 View Result Date: 09/12/2023 CLINICAL DATA:  Shortness of breath. EXAM: CHEST - 2 VIEW COMPARISON:  None available currently. FINDINGS: Mild cardiomegaly. Right lung is clear. Minimal left basilar atelectasis or infiltrate is noted with small left pleural effusion. Bony thorax is  unremarkable. IMPRESSION: Minimal left basilar subsegmental atelectasis or infiltrate is noted with small left pleural effusion. Aortic Atherosclerosis (ICD10-I70.0). Electronically Signed   By: Lupita Raider M.D.   On: 09/12/2023 09:40   CARDIAC CATHETERIZATION Result Date: 09/11/2023   Right Heart: Simultaneous LV/PCWP pressures were not measured, but PCWP 29 and LVEDP 11 suggests gradient across mitral valve with significant mitral stenosis. 1. Mildly elevated RA pressure, elevated PCWP with prominent v-waves, normal LVEDP.  Simultaneous LV/PCWP pressures were not measured, but PCWP 29 and LVEDP 11 suggests gradient across mitral valve with significant mitral stenosis.  Will need echo to reassess mitral valve. 2. Moderate pulmonary venous hypertension. 3. Low but not markedly low  cardiac output. 4. Severe, heavily calcified proximal LAD stenosis after D1 and S1 (99%) with TIMI 2 flow down the distal LAD. 5. Suspect 80% heavily calcified ostial RCA stenosis. Will admit patient, consult TCTS.  If not surgical candidate, would need PCI with atherectomy to LAD and RCA.   Medications:    Scheduled Medications:  allopurinol  100 mg Oral BID   amiodarone  200 mg Oral Daily   aspirin  81 mg Oral Daily   atorvastatin  80 mg Oral Daily   carvedilol  12.5 mg Oral BID WC   dapagliflozin propanediol  10 mg Oral QAC breakfast   ferrous sulfate  325 mg Oral Daily   folic acid  1 mg Oral Daily   furosemide  40 mg Intravenous BID   Living Better with Heart Failure Book   Does not apply Once   pantoprazole  40 mg Oral BID   polyethylene glycol  17 g Oral Daily   potassium chloride  60 mEq Oral Q2H   sodium chloride flush  3 mL Intravenous Q12H   sodium chloride flush  3 mL Intravenous Q12H   spironolactone  25 mg Oral Daily    Infusions:  sodium chloride     sodium chloride     heparin 950 Units/hr (09/12/23 0018)    PRN Medications: sodium chloride, sodium chloride, acetaminophen, docusate sodium, ondansetron (ZOFRAN) IV, sodium chloride flush, sodium chloride flush  Patient Profile   Connor Bryant is a 77 y.o. male with past medical history of CVA, persistent Afib, and chronic systolic heart failure.   Assessment/Plan   1. Acute on chronic systolic CHF: Drop in EF in 8/21 to 35% with apical akinesis in setting CVA with improvement to normal EF by 10/21. This was thought to be due to stress cardiomyopathy.  Echo in 11/24 showed EF back down to EF 30-35% with diffuse hypokinesis, normal RV, PASP 60 mmHg, mild-moderate MR, moderate AS. This was in the setting of atrial fibrillation and anemia. I did not think that atrial fibrillation was the cause of this as rate was well-controlled (not tachycardia-mediated). LHC/RHC 12/18 showed critical proximal LAD and severe ostial RCA  stenoses, suggesting an ischemic cardiomyopathy. Mildly elevated RA pressure, elevated PCWP with prominent v-waves, normal LVEDP. Simultaneous LV/PCWP pressures were not measured, but PCWP 29 and LVEDP 11 suggests gradient across mitral valve with significant mitral stenosis. Moderate pulmonary venous hypertension. Low but not markedly low cardiac output (CI 2.23 Fick, 2.03 thermo).   - Continue Lasix 40 mg IV bid. Place Unna boots. BMET this afternoon. - Continue home Farxiga 10 mg and spironolactone 25 mg.  - Echo to reassess LV function as well as the aortic and mitral valves (see below) today. - TCTS evaluation for CABG underway. If deemed too high risk, will need  to consider PCI with atherectomy (see below).  2. CAD: Patient has not had any chest pain. He has had exertional dyspnea. Echo in 11/24 showed EF down to 30-35%. Cath 12/18 showed severe, heavily calcified proximal LAD stenosis after D1 and S1 (99%) with TIMI 2 flow down the distal LAD. Suspect 80% heavily calcified ostial RCA stenosis. Suspect ischemic cardiomyopathy.   - TCTS to consult for CABG. If deemed too high risk, will need to consider PCI to LAD and RCA with atherectomy.  - Continue statin.  - ASA 81 daily.  3. Atrial fibrillation: Persistent. He is back on Eliquis after recent GI bleeding from erosive gastritis. He was started on amiodarone at 11/24 admission. I would like to get him back into NSR eventually.  - Hold Eliquis for now pending evaluation CABG. On heparin gtt.  - Continue amiodarone 200 mg daily, LFTs okay. - If he has CABG, would like him to have Maze as well. - If no CABG/Maze, will need eventual DCCV.  - If he has further GI bleeding, would consider Watchman for him.  4. CVA: 8/21, suspect due to atrial fibrillation.   5. PE: In 8/21, he is on Eliquis.  6. Aortic stenosis: AS looked moderate on 11/24 echo. S2 can be heard clearly. However, aortic valve was easily crossed on cath yesterday and peak-to-peak  gradient was only 8 mmHg.  - Assess aortic valve on pending echo.  7. PAD: H/o iliac stents and mesenteric artery bypass. No claudication or intestinal angina. No pedal ulcerations.  - Continue statin.  8. Carotid stenosis: Repeat carotid dopplers today.   9. GI bleeding: 11/24 GI bleeding from erosive esophagitis. CBC has been stable recently with no overt bleeding.  - Avoid ETOH.  10. CKD stage 3: sCr at baseline ~1.4-1.7. Follow BMET carefully.   11. Mitral stenosis: Mild mitral stenosis on 11/24 echo with mean gradient 5 mmHg.  On cath toady, simultaneous LV/PCWP pressures were not measured, but PCWP 29 and LVEDP 11 suggests gradient across mitral valve with significant mitral stenosis.  - reassess mitral stenosis by echo today  12. Hypokalemia - K 2.9 this am - Replete with 60 bid. Recheck BMET this afternoon.  Length of Stay: 1  [place] Shulamit Donofrio, NP  09/12/2023, 10:02 AM  Advanced Heart Failure Team Pager [phone] (M-F; 7a - 5p)  Please contact [facility] for night-coverage after hours (5p -7a ) and weekends on [email or web address]

## 2023-09-12 NOTE — Progress Notes (Signed)
PHARMACY - ANTICOAGULATION CONSULT NOTE  Pharmacy Consult for UFH IV Infusion Indication: chest pain/ACS and atrial fibrillation  No Known Allergies  Patient Measurements: Height: 5\' 8"  (172.7 cm) Weight: 82.8 kg (182 lb 8.7 oz) (Scale A) IBW/kg (Calculated) : 68.4 Heparin Dosing Weight: 81.2 kg  Vital Signs: Temp: 98.3 F (36.8 C) (12/19 0834) Temp Source: Oral (12/19 0834) BP: 103/87 (12/19 0834) Pulse Rate: 70 (12/19 0834)  Labs: Recent Labs    09/11/23 1433 09/11/23 1637 09/12/23 0210 09/12/23 0818  HGB 10.2*  10.5* 10.3* 10.2*  --   HCT 30.0*  31.0* 31.7* 31.0*  --   PLT  --  182 176  --   APTT  --   --   --  64*  HEPARINUNFRC  --   --   --  >1.10*  CREATININE  --  1.48* 1.46*  --     Estimated Creatinine Clearance: 43.8 mL/min (A) (by C-G formula based on SCr of 1.46 mg/dL (H)).   Medical History: Past Medical History:  Diagnosis Date   Acute CVA (cerebrovascular accident) (HCC) 05/01/2020   Acute respiratory failure with hypoxia (HCC) 05/04/2020   Arthritis    DM type 2 (diabetes mellitus, type 2) (HCC)    no meds   Endotracheally intubated    Gallstone    Heart murmur    was told one time he had a murmur, not since 30 years ago   HTN (hypertension)    Hyperlipidemia    Personal history of colonic polyp-adenoma 11/24/2013   Renal insufficiency    mild   Thoracic spine fracture (HCC)     Medications:  Medications Prior to Admission  Medication Sig Dispense Refill Last Dose/Taking   allopurinol (ZYLOPRIM) 100 MG tablet TAKE 1 TABLET 2 TO 3 TIMES A DAY FOR GOUT 90 tablet 0 09/11/2023   amiodarone (PACERONE) 200 MG tablet Take 2 tablets (400 mg total) by mouth 2 (two) times daily for 12 days, THEN 1 tablet (200 mg total) daily. (Patient taking differently: Take 400 mg daily for 3 days then take 200 mg daily ) 78 tablet 0 09/10/2023   apixaban (ELIQUIS) 5 MG TABS tablet Take 1 tablet (5 mg total) by mouth 2 (two) times daily. 180 tablet 3 09/09/2023    atorvastatin (LIPITOR) 80 MG tablet Take 1 tablet (80 mg total) by mouth daily. 90 tablet 3 09/11/2023   Calcium Carb-Cholecalciferol (CALCIUM 500 + D PO) Take 1 tablet by mouth daily.   Taking   carvedilol (COREG) 12.5 MG tablet Take 1 tablet (12.5 mg total) by mouth 2 (two) times daily with a meal. 60 tablet 0 09/11/2023   dapagliflozin propanediol (FARXIGA) 10 MG TABS tablet Take 1 tablet (10 mg total) by mouth daily before breakfast. 30 tablet 11 09/10/2023   Ferrous Sulfate (IRON PO) Take 1 tablet by mouth daily.   09/11/2023   folic acid (FOLVITE) 1 MG tablet Take 1 tablet (1 mg total) by mouth daily. 90 tablet 3 09/11/2023   furosemide (LASIX) 20 MG tablet Take 1 tablet (20 mg total) by mouth daily. 30 tablet 11 09/10/2023   glimepiride (AMARYL) 2 MG tablet TAKE ONE TABLET ONCE DAILY EVERY MORNING (Patient taking differently: Take 2 mg by mouth daily with breakfast.) 90 tablet 3 09/10/2023   polyethylene glycol (MIRALAX / GLYCOLAX) packet Take 17 g by mouth daily. (Patient taking differently: Take 17 g by mouth daily as needed for moderate constipation.) 30 packet 1 Taking Differently   predniSONE (DELTASONE) 10  MG tablet Take 10 mg by mouth daily as needed (arthritis flare).   Past Month   pyridOXINE (B-6) 50 MG tablet Take 1 tablet (50 mg total) by mouth daily. 90 tablet 1 09/11/2023   spironolactone (ALDACTONE) 25 MG tablet Take 0.5 tablets (12.5 mg total) by mouth daily. 45 tablet 3 09/10/2023   Blood Glucose Monitoring Suppl DEVI 1 each by Does not apply route in the morning, at noon, and at bedtime. May substitute to any manufacturer covered by patient's insurance. 1 each 0    docusate sodium (COLACE) 100 MG capsule Take 100 mg by mouth daily as needed for mild constipation.   Unknown   pantoprazole (PROTONIX) 40 MG tablet Take 1 tablet (40 mg total) by mouth 2 (two) times daily. 60 tablet 1 Unknown   Scheduled:   allopurinol  100 mg Oral BID   amiodarone  200 mg Oral Daily    aspirin  81 mg Oral Daily   atorvastatin  80 mg Oral Daily   carvedilol  12.5 mg Oral BID WC   dapagliflozin propanediol  10 mg Oral QAC breakfast   ferrous sulfate  325 mg Oral Daily   folic acid  1 mg Oral Daily   furosemide  40 mg Intravenous BID   Living Better with Heart Failure Book   Does not apply Once   pantoprazole  40 mg Oral BID   polyethylene glycol  17 g Oral Daily   potassium chloride  60 mEq Oral Q2H   sodium chloride flush  3 mL Intravenous Q12H   sodium chloride flush  3 mL Intravenous Q12H   spironolactone  25 mg Oral Daily   Infusions:   sodium chloride     sodium chloride     heparin 950 Units/hr (09/12/23 0018)   PRN: sodium chloride, sodium chloride, acetaminophen, docusate sodium, ondansetron (ZOFRAN) IV, sodium chloride flush, sodium chloride flush Anti-infectives (From admission, onward)    None       Assessment: 33 YOM PMH of CVA, persistent AF, and systolic CHF. On 11/24 pt was admitted to [facility] with anemia and hypotension with melena. Pt received 2 units of pRBC and remained hypotensive. Transferred to [facility]. Underwent LHC on 12/18 with severe prox LAD stenosis (99%) and suspected heavily calcified RCA (80%). Plans to evaluate for CABG or PCI with atherectomy at this time. Prior to admission, patient was on Eliquis for [facility] for AF. No current s/sx of bleeding per RN. Will start continuous AC with UFH IV infusion.  Heparin level came back elevated as expected >1.1 with recent DOAC, aPTT came back slightly subtherapeutic at 64, on 950 units/hr. Hgb 10.2, plt 176. No s/sx of bleeding or infusion issues.   Goal of Therapy:  Heparin level 0.3-0.7 units/ml aPTT 66-102 seconds Monitor platelets by anticoagulation protocol: Yes   Plan:  Increase heparin infusion to 1100 units/hr  Check aPTT in 8 hours  Continue to check aPTT until anti-Xa aligns with aPTTs then switch to anti-Xa monitoring daily Continue to monitor H&H and platelets  Thank you for allowing  pharmacy to participate in this patient's care,  Sherron Monday, PharmD, BCCCP Clinical Pharmacist  Phone: [phone] 09/12/2023 10:21 AM  Please check AMION for all [facility] Pharmacy phone numbers After 10:00 PM, call Main Pharmacy [phone]

## 2023-09-12 NOTE — TOC Initial Note (Addendum)
Transition of Care [facility]) - Initial/Assessment Note    Patient Details  Name: Connor Bryant MRN: [id number] Date of Birth: [birth year]  Transition of Care [facility]) CM/SW Contact:    Elliot Cousin, RN Phone Number: [phone] 09/12/2023, 2:51 PM  Clinical Narrative:                  HF TOC CM spoke to pt and states he lives alone. Has Rolling walker, wheelchair and cane at home.  His son takes him to appts. States he is active with [facility].  Will need orders for [facility] RN and HHPT with [facility].  Will continue to follow for dc needs.   Expected Discharge Plan: Home w Home Health Services Barriers to Discharge: Continued Medical Work up   Patient Goals and CMS Choice Patient states their goals for this hospitalization and ongoing recovery are:: wants to get better CMS Medicare.gov Compare Post Acute Care list provided to:: Patient        Expected Discharge Plan and Services   Discharge Planning Services: CM Consult Post Acute Care Choice: Home Health Living arrangements for the past 2 months: Single Family Home                           HH Arranged: PT, RN HH Agency: [facility] Date [facility] Agency Contacted: 09/12/23 Time HH Agency Contacted: [id number] Representative spoke with at [facility] Agency: Hassel Neth  Prior Living Arrangements/Services Living arrangements for the past 2 months: Single Family Home Lives with:: Self Patient language and need for interpreter reviewed:: Yes Do you feel safe going back to the place where you live?: Yes      Need for Family Participation in Patient Care: Yes (Comment) Care giver support system in place?: Yes (comment) Current home services: DME (wheelchair, rolling walker) Criminal Activity/Legal Involvement Pertinent to Current Situation/Hospitalization: No - Comment as needed  Activities of Daily Living   ADL Screening (condition at time of admission) Independently performs ADLs?: No Does the patient have a NEW  difficulty with bathing/dressing/toileting/self-feeding that is expected to last >3 days?: Yes (Initiates electronic notice to provider for possible OT consult) Does the patient have a NEW difficulty with getting in/out of bed, walking, or climbing stairs that is expected to last >3 days?: Yes (Initiates electronic notice to provider for possible PT consult) Does the patient have a NEW difficulty with communication that is expected to last >3 days?: No Is the patient deaf or have difficulty hearing?: No Does the patient have difficulty seeing, even when wearing glasses/contacts?: No Does the patient have difficulty concentrating, remembering, or making decisions?: No  Permission Sought/Granted Permission sought to share information with : Case Manager, Family Supports, PCP Permission granted to share information with : Yes, Verbal Permission Granted  Share Information with NAME: Yochanan Dempewolf  Permission granted to share info w AGENCY: Home Health, DME  Permission granted to share info w Relationship: son  Permission granted to share info w Contact Information: [phone]  Emotional Assessment   Attitude/Demeanor/Rapport: Engaged Affect (typically observed): Appropriate Orientation: : Oriented to Self, Oriented to Place, Oriented to  Time, Oriented to Situation   Psych Involvement: No (comment)  Admission diagnosis:  CHF (congestive heart failure) (HCC) [I50.9] Patient Active Problem List   Diagnosis Date Noted   CHF (congestive heart failure) (HCC) 09/11/2023   HFrEF (heart failure with reduced ejection fraction) (HCC) 08/21/2023   Acute on chronic anemia 08/18/2023  Symptomatic anemia 08/18/2023   On apixaban therapy 08/18/2023   Paroxysmal atrial fibrillation (HCC) 08/18/2023   GI bleed 08/17/2023   GIB (gastrointestinal bleeding) 08/17/2023   Hypotension 08/17/2023   AKI (acute kidney injury) (HCC) 08/17/2023   Respiratory distress 08/17/2023   Rheumatoid arthritis involving  multiple sites with positive rheumatoid factor (HCC) 06/25/2023   Diabetes mellitus treated with oral medication (HCC) 06/03/2023   Non-ST elevation (NSTEMI) myocardial infarction [facility])    Acute systolic heart failure (HCC)    Elevated troponin 05/03/2020   Fever 05/03/2020   Positive D dimer 05/03/2020   PAF (paroxysmal atrial fibrillation) (HCC) 05/02/2020   S/P insertion of iliac artery stent 05/01/2020   Diabetic lipidosis (HCC) 03/10/2020   Intermittent claudication (HCC) 03/10/2020   Compression fracture of T12 vertebra (HCC) 10/27/2018   Spinal stenosis of lumbosacral region 10/27/2018   Small bowel obstruction (HCC) 09/30/2018   Back pain of lumbar region with sciatica 08/26/2018   Chronic left-sided low back pain with left-sided sciatica 08/26/2018   Tremor 08/26/2018   Pure hypercholesterolemia 02/25/2018   Atherosclerosis of aorta (HCC) 02/24/2018   Bilateral hip pain 06/24/2017   Body mass index 30.0-30.9, adult 06/06/2016   Gout 06/06/2016   Essential hypertension 06/06/2016   Chronic mesenteric ischemia (HCC) 03/01/2014   Peripheral vascular disease (HCC) 02/16/2014   Superior mesenteric artery stenosis (HCC) 02/16/2014   History of colonic polyps 11/24/2013   PCP:  Mechele Claude, MD Pharmacy:   [facility] [place], [place] - [address] [place] [address] Phone: [phone] Fax: [phone]  [id number] - [place], [place] - [address] [place] [address] Phone: [phone] Fax: [phone]  [facility] [address] [place] [address] Phone: [phone] Fax: [phone]     Social Drivers of Health (SDOH) Social History: SDOH Screenings   Food Insecurity: No Food Insecurity (09/11/2023)  Housing: Low Risk  (09/11/2023)  Transportation Needs: No Transportation Needs (09/11/2023)  Utilities: Not At Risk (09/11/2023)  Alcohol  Screen: Low Risk  (08/19/2023)  Depression (PHQ2-9): Low Risk  (08/27/2023)  Financial Resource Strain: Low Risk  (08/19/2023)  Physical Activity: Insufficiently Active (12/03/2022)  Social Connections: Moderately Isolated (12/03/2022)  Stress: No Stress Concern Present (12/03/2022)  Tobacco Use: Medium Risk (09/12/2023)   SDOH Interventions:     Readmission Risk Interventions     No data to display

## 2023-09-12 NOTE — Progress Notes (Signed)
Mobility Specialist Progress Note:    09/12/23 1000  Mobility  Activity Transferred from bed to chair  Level of Assistance Contact guard assist, steadying assist  Assistive Device Front wheel walker  Distance Ambulated (ft) 4 ft  Activity Response Tolerated well  Mobility Referral Yes  Mobility visit 1 Mobility  Mobility Specialist Start Time (ACUTE ONLY) [id number]  Mobility Specialist Stop Time (ACUTE ONLY) 0951  Mobility Specialist Time Calculation (min) (ACUTE ONLY) 15 min   Pt received in bed, agreeable to mobility. Able to stand w/ CG after rasing bed. No complaints throughout. Pt left in chair with call bell and all needs met.  Connor Bryant Mobility Specialist Please contact via Special educational needs teacher or Rehab office at [phone]

## 2023-09-12 NOTE — Research (Signed)
SITE: 050     Subject # [id number]   Subprotocol: A  Inclusion Criteria  Patients who meet all of the following criteria are eligible for enrollment as study participants:  Yes No  Age > 78 years old X   Eligible to wear Holter Study X    Exclusion Criteria  Patients who meet any of these criteria are not eligible for enrollment as study participants: Yes No  1. Receiving any mechanical (respiratory or circulatory) or renal support therapy at Screening or during Visit #1.  X  2.  Any other conditions that in the opinion of the investigators are likely to prevent compliance with the study protocol or pose a safety concern if the subject participates in the study.  X  3. Poor tolerance, namely susceptible to severe skin allergies from ECG adhesive patch application.  X   Protocol: REV H                                     Residential Zip code [address] (First 3 digits ONLY)                                             [employer] Informed Consent   Subject Name: Connor Bryant  Subject met inclusion and exclusion criteria.  The informed consent form, study requirements and expectations were reviewed with the subject. Subject had opportunity to read consent and questions and concerns were addressed prior to the signing of the consent form.  The subject verbalized understanding of the trial requirements.  The subject agreed to participate in the [employer] EF ACT trial and signed the informed consent at 14:30 on 12-Sep-2023.  The informed consent was obtained prior to performance of any protocol-specific procedures for the subject.  A copy of the signed informed consent was given to the subject and a copy was placed in the subject's medical record.   Kristion Holifield L Nikko Goldwire         No current facility-administered medications for this visit. No current outpatient medications on file.  Facility-Administered Medications Ordered in Other Visits:    0.9 %  sodium chloride infusion, 250 mL, Intravenous, PRN,  Laurey Morale, MD   acetaminophen (TYLENOL) tablet 650 mg, 650 mg, Oral, Q4H PRN, Laurey Morale, MD   allopurinol (ZYLOPRIM) tablet 100 mg, 100 mg, Oral, BID, Laurey Morale, MD, 100 mg at 09/12/23 1610   amiodarone (PACERONE) tablet 200 mg, 200 mg, Oral, Daily, Laurey Morale, MD, 200 mg at 09/12/23 9604   aspirin chewable tablet 81 mg, 81 mg, Oral, Daily, Laurey Morale, MD, 81 mg at 09/12/23 0844   atorvastatin (LIPITOR) tablet 80 mg, 80 mg, Oral, Daily, Laurey Morale, MD, 80 mg at 09/12/23 0844   carvedilol (COREG) tablet 12.5 mg, 12.5 mg, Oral, BID WC, Laurey Morale, MD, 12.5 mg at 09/12/23 5409   dapagliflozin propanediol (FARXIGA) tablet 10 mg, 10 mg, Oral, QAC breakfast, Laurey Morale, MD, 10 mg at 09/12/23 8119   docusate sodium (COLACE) capsule 100 mg, 100 mg, Oral, Daily PRN, Laurey Morale, MD   ferrous sulfate tablet 325 mg, 325 mg, Oral, Daily, Laurey Morale, MD, 325 mg at 09/12/23 0844   folic acid (FOLVITE) tablet 1 mg, 1 mg, Oral, Daily, Marca Ancona  S, MD, 1 mg at 09/12/23 0844   furosemide (LASIX) injection 40 mg, 40 mg, Intravenous, BID, Laurey Morale, MD, 40 mg at 09/12/23 0844   heparin ADULT infusion 100 units/mL (25000 units/221mL), 1,100 Units/hr, Intravenous, Continuous, Kendal Hymen, RPH, Last Rate: 11 mL/hr at 09/12/23 1046, 1,100 Units/hr at 09/12/23 1046   Living Better with Heart Failure Book, , Does not apply, Once, Laurey Morale, MD   ondansetron [facility]) injection 4 mg, 4 mg, Intravenous, Q6H PRN, Laurey Morale, MD   pantoprazole (PROTONIX) EC tablet 40 mg, 40 mg, Oral, BID, Laurey Morale, MD, 40 mg at 09/12/23 0844   polyethylene glycol (MIRALAX / GLYCOLAX) packet 17 g, 17 g, Oral, Daily, Laurey Morale, MD   potassium chloride SA (KLOR-CON M) CR tablet 20 mEq, 20 mEq, Oral, Once, Romie Minus, MD   sodium chloride flush (NS) 0.9 % injection 3 mL, 3 mL, Intravenous, Q12H, Laurey Morale, MD   sodium chloride  flush (NS) 0.9 % injection 3 mL, 3 mL, Intravenous, PRN, Laurey Morale, MD   sodium chloride flush (NS) 0.9 % injection 3 mL, 3 mL, Intravenous, Q12H, Laurey Morale, MD, 3 mL at 09/11/23 2116   sodium chloride flush (NS) 0.9 % injection 3 mL, 3 mL, Intravenous, PRN, Laurey Morale, MD   spironolactone (ALDACTONE) tablet 25 mg, 25 mg, Oral, Daily, Romie Minus, MD, 25 mg at 09/12/23 [phone]

## 2023-09-12 NOTE — Plan of Care (Signed)
  Problem: Activity: Goal: Ability to return to baseline activity level will improve Outcome: Progressing   Problem: Cardiovascular: Goal: Ability to achieve and maintain adequate cardiovascular perfusion will improve Outcome: Progressing   Problem: Health Behavior/Discharge Planning: Goal: Ability to safely manage health-related needs after discharge will improve Outcome: Progressing   Problem: Education: Goal: Knowledge of General Education information will improve Description: Including pain rating scale, medication(s)/side effects and non-pharmacologic comfort measures Outcome: Progressing   Problem: Health Behavior/Discharge Planning: Goal: Ability to manage health-related needs will improve Outcome: Progressing   Problem: Clinical Measurements: Goal: Ability to maintain clinical measurements within normal limits will improve Outcome: Progressing Goal: Will remain free from infection Outcome: Progressing Goal: Diagnostic test results will improve Outcome: Progressing Goal: Respiratory complications will improve Outcome: Progressing Goal: Cardiovascular complication will be avoided Outcome: Progressing   Problem: Activity: Goal: Risk for activity intolerance will decrease Outcome: Progressing   Problem: Nutrition: Goal: Adequate nutrition will be maintained Outcome: Progressing   Problem: Coping: Goal: Level of anxiety will decrease Outcome: Progressing   Problem: Pain Management: Goal: General experience of comfort will improve Outcome: Progressing   Problem: Safety: Goal: Ability to remain free from injury will improve Outcome: Progressing

## 2023-09-12 NOTE — Consult Note (Addendum)
[address]       [place] [address]             [phone]        FLETCHER HILLMER [facility] Medical Record #[id number] Date of Birth: 23-Nov-1945  Referring: Marca Ancona, MD  Primary Care: Mechele Claude, MD Primary Cardiologist: Dina Rich, MD  Reason for Consult:  Evaluation for potential surgical management of  multivessel coronary artery disease, moderate aortic stenosis and persistent atrial fibrillation in the setting of acute on chronic systolic heart failure.   History of Present Illness:   Mr. Connor Bryant is a 77 year old male with a past medical history notable for type 2 diabetes mellitus, hyperlipidemia, hypertension, and peripheral vascular disease s/p mesenteric artery bypass in 2015 and s/p bilateral iliac artery stenting in 2022. He  initially diagnosed congestive heart failure when he was admitted to the hospital for a CVA in August of 2021. He was found to be in atrial fibrillation (thought to be culprit for embolic CVA) was noted to have LVEF of 35% on echocardiogram with apical akinesis. That hospital admission was also complicated by pulmonary embolus and acute kidney injury so left heart catheterization was not pursued at the time.  He had a subsequent echocardiogram a few months later showing his left ventricular function had recovered with an EF of 60 to 65% with mild to moderate aortic stenosis.  He was followed by Dr. Wyline Mood in [place].  Follow-up echo in July 2023 again showed an ejection fraction of 60 to 65% with moderate aortic stenosis with aortic valve area 1.2 cm.  Earlier this fall, Connor Bryant noticed progressive weakness and shortness of breath without any associated chest pain.  He also noticed his stools and become dark.  He presented to the [facility] emergency room in November of this year for evaluation of the symptoms.  His hemoglobin was 7.5, creatinine 2.39, he was hypotensive.  He was transfused with 2 units of packed  red blood cells.  He underwent GI evaluation with an EGD showing erosive gastritis likely further complicated by the Eliquis that he was taking for chronic atrial fibrillation.  An echocardiogram obtained during that admission again showed decline in the left ventricular function to an ejection fraction of 30 to 35% with diffuse hypokinesis mild to moderate mitral insufficiency, and moderate aortic stenosis.  Connor Bryant was referred to Dr. Shirlee Latch for evaluation of his systolic heart failure.  He appears volume overloaded in the clinic and was started on Lasix, Farxiga, and spironolactone.  His hemoglobin was felt to be stable at 10.7 g and his renal function had recovered to a creatinine of 1.76.  Further evaluation with right and left heart catheterization was recommended.  Connor Bryant was brought to the hospital yesterday for elective catheterization.  He was discovered to have high-grade stenoses in the mid LAD and in the ostium of the RCA.  Right heart catheterization revealed pulmonary hypertension with PA pressures 57/23, mean 34. CT surgery has been asked to evaluate Connor Bryant for consideration of coronary bypass grafting, possible aortic valve replacement, and possible Maze procedure for persistent atrial fibrillation.  Connor Bryant is currently In the bedside chair and in no distress.  He denies ever having any chest pain and has been most symptomatic with shortness of breath and fatigue.  He is retired from working in [employer] traffic [employer].  He is a widower having lost his wife in May of this  year.  He currently lives alone and has a son who lives in the area.  He admits that his activity level has declined significantly since losing his wife.  He rarely leaves home now.    Current Activity/ Functional Status: Patient is independent with mobility/ambulation, transfers, ADL's, IADL's.   Zubrod Score: At the time of surgery this patient's most appropriate activity status/level should be described  as: []     0    Normal activity, no symptoms []     1    Restricted in physical strenuous activity but ambulatory, able to do out light work []     2    Ambulatory and capable of self care, unable to do work activities, up and about                 more than 50%  Of the time                            []     3    Only limited self care, in bed greater than 50% of waking hours []     4    Completely disabled, no self care, confined to bed or chair []     5    Moribund  Past Medical History:  Diagnosis Date   Acute CVA (cerebrovascular accident) (HCC) 05/01/2020   Acute respiratory failure with hypoxia (HCC) 05/04/2020   Arthritis    DM type 2 (diabetes mellitus, type 2) (HCC)    no meds   Endotracheally intubated    Gallstone    Heart murmur    was told one time he had a murmur, not since 30 years ago   HTN (hypertension)    Hyperlipidemia    Personal history of colonic polyp-adenoma 11/24/2013   Renal insufficiency    mild   Thoracic spine fracture [facility])     Past Surgical History:  Procedure Laterality Date   ABDOMINAL AORTAGRAM N/A 02/23/2014   Procedure: ABDOMINAL Ronny Flurry;  Surgeon: Fransisco Hertz, MD;  Location: [facility] CATH LAB;  Service: Cardiovascular;  Laterality: N/A;   BIOPSY  08/20/2023   Procedure: BIOPSY;  Surgeon: Imogene Burn, MD;  Location: [facility] ENDOSCOPY;  Service: Gastroenterology;;   CATARACT EXTRACTION, BILATERAL     COLONOSCOPY     DEBRIDEMENT TENNIS ELBOW Right    ESOPHAGOGASTRODUODENOSCOPY     ESOPHAGOGASTRODUODENOSCOPY (EGD) WITH PROPOFOL N/A 08/20/2023   Procedure: ESOPHAGOGASTRODUODENOSCOPY (EGD) WITH PROPOFOL;  Surgeon: Imogene Burn, MD;  Location: [facility] ENDOSCOPY;  Service: Gastroenterology;  Laterality: N/A;   MESENTERIC ARTERY BYPASS N/A 03/10/2014   Procedure: AORTO TO SUPERIOR MESENTERIC ARTERY BYPASS;  Surgeon: Pryor Ochoa, MD;  Location: [facility] OR;  Service: Vascular;  Laterality: N/A;   RIGHT/LEFT HEART CATH AND CORONARY ANGIOGRAPHY N/A 09/11/2023    Procedure: RIGHT/LEFT HEART CATH AND CORONARY ANGIOGRAPHY;  Surgeon: Laurey Morale, MD;  Location: [facility] INVASIVE CV LAB;  Service: Cardiovascular;  Laterality: N/A;   TONSILLECTOMY AND ADENOIDECTOMY     vascualr surgery     illiac stents    Social History   Tobacco Use  Smoking Status Former   Current packs/day: 0.00   Types: Cigarettes   Quit date: 07/08/2009   Years since quitting: 14.1   Passive exposure: Past  Smokeless Tobacco Never    Social History   Substance and Sexual Activity  Alcohol Use Not Currently     No Known Allergies  Current Facility-Administered Medications  Medication Dose  Route Frequency Provider Last Rate Last Admin   0.9 %  sodium chloride infusion  250 mL Intravenous PRN Laurey Morale, MD       0.9 %  sodium chloride infusion  250 mL Intravenous PRN Laurey Morale, MD       acetaminophen (TYLENOL) tablet 650 mg  650 mg Oral Q4H PRN Laurey Morale, MD       allopurinol (ZYLOPRIM) tablet 100 mg  100 mg Oral BID Laurey Morale, MD   100 mg at 09/12/23 0623   amiodarone (PACERONE) tablet 200 mg  200 mg Oral Daily Laurey Morale, MD   200 mg at 09/12/23 7628   aspirin chewable tablet 81 mg  81 mg Oral Daily Laurey Morale, MD   81 mg at 09/12/23 0844   atorvastatin (LIPITOR) tablet 80 mg  80 mg Oral Daily Laurey Morale, MD   80 mg at 09/12/23 0844   carvedilol (COREG) tablet 12.5 mg  12.5 mg Oral BID WC Laurey Morale, MD   12.5 mg at 09/12/23 3151   dapagliflozin propanediol (FARXIGA) tablet 10 mg  10 mg Oral QAC breakfast Laurey Morale, MD   10 mg at 09/12/23 7616   docusate sodium (COLACE) capsule 100 mg  100 mg Oral Daily PRN Laurey Morale, MD       ferrous sulfate tablet 325 mg  325 mg Oral Daily Laurey Morale, MD   325 mg at 09/12/23 0844   folic acid (FOLVITE) tablet 1 mg  1 mg Oral Daily Laurey Morale, MD   1 mg at 09/12/23 0844   furosemide (LASIX) injection 40 mg  40 mg Intravenous BID Laurey Morale, MD   40 mg  at 09/12/23 0844   heparin ADULT infusion 100 units/mL (25000 units/267mL)  950 Units/hr Intravenous Continuous Wilmer Floor, RPH 9.5 mL/hr at 09/12/23 0018 950 Units/hr at 09/12/23 0018   Living Better with Heart Failure Book   Does not apply Once Laurey Morale, MD       ondansetron [facility]) injection 4 mg  4 mg Intravenous Q6H PRN Laurey Morale, MD       pantoprazole (PROTONIX) EC tablet 40 mg  40 mg Oral BID Laurey Morale, MD   40 mg at 09/12/23 0844   polyethylene glycol (MIRALAX / GLYCOLAX) packet 17 g  17 g Oral Daily Laurey Morale, MD       potassium chloride SA (KLOR-CON M) CR tablet 60 mEq  60 mEq Oral Q2H Laurey Morale, MD   60 mEq at 09/12/23 0842   sodium chloride flush (NS) 0.9 % injection 3 mL  3 mL Intravenous Q12H Laurey Morale, MD       sodium chloride flush (NS) 0.9 % injection 3 mL  3 mL Intravenous PRN Laurey Morale, MD       sodium chloride flush (NS) 0.9 % injection 3 mL  3 mL Intravenous Q12H Laurey Morale, MD   3 mL at 09/11/23 2116   sodium chloride flush (NS) 0.9 % injection 3 mL  3 mL Intravenous PRN Laurey Morale, MD       spironolactone (ALDACTONE) tablet 25 mg  25 mg Oral Daily Romie Minus, MD   25 mg at 09/12/23 0737    Medications Prior to Admission  Medication Sig Dispense Refill Last Dose/Taking   allopurinol (ZYLOPRIM) 100 MG tablet TAKE 1 TABLET 2 TO 3 TIMES A DAY  FOR GOUT 90 tablet 0 09/11/2023   amiodarone (PACERONE) 200 MG tablet Take 2 tablets (400 mg total) by mouth 2 (two) times daily for 12 days, THEN 1 tablet (200 mg total) daily. (Patient taking differently: Take 400 mg daily for 3 days then take 200 mg daily ) 78 tablet 0 09/10/2023   apixaban (ELIQUIS) 5 MG TABS tablet Take 1 tablet (5 mg total) by mouth 2 (two) times daily. 180 tablet 3 09/09/2023   atorvastatin (LIPITOR) 80 MG tablet Take 1 tablet (80 mg total) by mouth daily. 90 tablet 3 09/11/2023   Calcium Carb-Cholecalciferol (CALCIUM 500 + D PO) Take 1 tablet  by mouth daily.   Taking   carvedilol (COREG) 12.5 MG tablet Take 1 tablet (12.5 mg total) by mouth 2 (two) times daily with a meal. 60 tablet 0 09/11/2023   dapagliflozin propanediol (FARXIGA) 10 MG TABS tablet Take 1 tablet (10 mg total) by mouth daily before breakfast. 30 tablet 11 09/10/2023   Ferrous Sulfate (IRON PO) Take 1 tablet by mouth daily.   09/11/2023   folic acid (FOLVITE) 1 MG tablet Take 1 tablet (1 mg total) by mouth daily. 90 tablet 3 09/11/2023   furosemide (LASIX) 20 MG tablet Take 1 tablet (20 mg total) by mouth daily. 30 tablet 11 09/10/2023   glimepiride (AMARYL) 2 MG tablet TAKE ONE TABLET ONCE DAILY EVERY MORNING (Patient taking differently: Take 2 mg by mouth daily with breakfast.) 90 tablet 3 09/10/2023   polyethylene glycol (MIRALAX / GLYCOLAX) packet Take 17 g by mouth daily. (Patient taking differently: Take 17 g by mouth daily as needed for moderate constipation.) 30 packet 1 Taking Differently   predniSONE (DELTASONE) 10 MG tablet Take 10 mg by mouth daily as needed (arthritis flare).   Past Month   pyridOXINE (B-6) 50 MG tablet Take 1 tablet (50 mg total) by mouth daily. 90 tablet 1 09/11/2023   spironolactone (ALDACTONE) 25 MG tablet Take 0.5 tablets (12.5 mg total) by mouth daily. 45 tablet 3 09/10/2023   Blood Glucose Monitoring Suppl DEVI 1 each by Does not apply route in the morning, at noon, and at bedtime. May substitute to any manufacturer covered by patient's insurance. 1 each 0    docusate sodium (COLACE) 100 MG capsule Take 100 mg by mouth daily as needed for mild constipation.   Unknown   pantoprazole (PROTONIX) 40 MG tablet Take 1 tablet (40 mg total) by mouth 2 (two) times daily. 60 tablet 1 Unknown    Family History  Problem Relation Age of Onset   Diabetes Mother    Heart disease Mother        before age 17   Hyperlipidemia Mother    Hypertension Mother    AAA (abdominal aortic aneurysm) Mother    Stroke Father    Hypertension Father     Coronary artery disease Father    Hyperlipidemia Father    Diabetes Father    Bladder Cancer Father    Cancer Father    Heart disease Father    Heart attack Father    Colon cancer Neg Hx    Throat cancer Neg Hx    Kidney disease Neg Hx    Liver disease Neg Hx      Review of Systems:      Cardiac Review of Systems: Y or  [    ]= no  Chest Pain [    ]  Resting SOB [   ] Exertional SOB  [ x ]  Orthopnea [  ]   Pedal Edema [  x ]    Palpitations [  ] Syncope  [  ]   Presyncope [   ]  General Review of Systems: [Y] = yes [  ]=no Constitional: recent weight change [  ]; anorexia [  ]; fatigue [  ]; nausea [  ]; night sweats [  ]; fever [  ]; or chills [  ]                                                               Dental: Last Dentist visit: past year  Eye : blurred vision [  ]; diplopia [   ]; vision changes [ loss of peripheral vision in left eye ];  Amaurosis fugax[  ]; Resp: cough [  ];  wheezing[  ];  hemoptysis[  ];  shortness of breath[ x ]; paroxysmal nocturnal dyspnea[  ]; dyspnea on exertion[ x ]; or orthopnea[  ];  GI:  gallstones[  ], vomiting[  ];  dysphagia[  ]; melena[  ];  hematochezia [  ]; heartburn[  ];   Hx of  Colonoscopy[  ]; GU: kidney stones [  ]; hematuria[  ];   dysuria [  ];  nocturia[  ];  history of     obstruction [  ]; urinary frequency [  ]             Skin: rash, swelling[  ];, hair loss[  ];  peripheral edema[  ];  or itching[  ]; Musculosketetal: myalgias[  ];  joint swelling[  ];  joint erythema[  ];  joint pain[  ];  back pain[  ];  Heme/Lymph: bruising[  ];  bleeding[  ];  anemia[  ];  Neuro: TIA[  ];  headaches[  ];  stroke[  ];  vertigo[  ];  seizures[  ];   paresthesias[  ];  difficulty walking[  ];  Psych:depression[  ]; anxiety[  ];  Endocrine: diabetes[ x ];  thyroid dysfunction[  ];                Physical Exam: BP 103/87 (BP Location: Left Arm)   Pulse 70   Temp 98.3 F (36.8 C) (Oral)   Resp 13   Ht 5\' 8"  (1.727 m)   Wt 82.8 kg  Comment: Scale A  SpO2 97%   BMI 27.76 kg/m    General appearance: alert, cooperative, and no distress Head: Normocephalic, without obvious abnormality, atraumatic Neck: no adenopathy, no carotid bruit, no JVD, and supple, symmetrical, trachea midline Lymph nodes: No cervical or clavicular adenopathy Resp: Normal work of breathing at rest.  Breath sounds are full, equal, and clear to auscultation Cardio: Irregularly irregular rhythm, monitor showing atrial fibrillation with ventricular rate in the high 50s.  There is a grade 3/6 systolic murmur heard most prominently over the right upper sternal border. GI: Soft, no tenderness.  Well-healed midline laparotomy scar Extremities: Multiple bruises in the right upper extremities.  1+ ankle edema bilaterally.  Radial pulses are easily palpable.  I was not able to palpate posterior tibial or dorsalis pedis pulses. Neurologic: Grossly normal except for loss of peripheral vision in the left eye  Diagnostic Studies & Laboratory data:  RIGHT/LEFT HEART CATH  AND CORONARY ANGIOGRAPHY   Conclusion      Right Heart: Simultaneous LV/PCWP pressures were not measured, but PCWP 29 and LVEDP 11 suggests gradient across mitral valve with significant mitral stenosis.   1. Mildly elevated RA pressure, elevated PCWP with prominent v-waves, normal LVEDP.  Simultaneous LV/PCWP pressures were not measured, but PCWP 29 and LVEDP 11 suggests gradient across mitral valve with significant mitral stenosis.     Will need echo to reassess mitral valve.  2. Moderate pulmonary venous hypertension.  3. Low but not markedly low cardiac output.  4. Severe, heavily calcified proximal LAD stenosis after D1 and S1 (99%) with TIMI 2 flow down the distal LAD.  5. Suspect 80% heavily calcified ostial RCA stenosis.    Will admit patient, consult TCTS.  If not surgical candidate, would need PCI with atherectomy to LAD and RCA.   Coronary Findings  Diagnostic Dominance:  Right Left Anterior Descending  Prox LAD to Mid LAD lesion is 99% stenosed. The lesion is calcified.    Left Circumflex  Ost Cx to Prox Cx lesion is 25% stenosed. The lesion is calcified.    Right Coronary Artery  Ost RCA lesion is 80% stenosed. The lesion is calcified.  Prox RCA to Mid RCA lesion is 30% stenosed. The lesion is calcified.    Intervention   No interventions have been documented.   Right Heart  Right Heart Pressures RHC Procedural Findings: Hemodynamics (mmHg) RA mean 12 RV 55/11 PA 57/23, mean 34 PCWP mean 29 with prominent V-waves to 40  LV 143/11 AO 151/70  Oxygen saturations: PA 54% AO 95%  Cardiac Output (Fick) 4.2  Cardiac Index (Fick) 2.23 PVR 1.19 WU  Cardiac Output (Thermo) 3.83  Cardiac Index (Thermo) 2.03 PVR 1.3 WU  PAPi 2.8   Aortic valve peak-to-peak gradient 8 mmHg  Simultaneous LV/PCWP pressures were not measured, but PCWP 29 and LVEDP 11 suggests gradient across mitral valve with significant mitral stenosis.   Coronary Diagrams  Diagnostic Dominance: Right       Recent Radiology Findings:      I have independently reviewed the above radiologic studies and discussed with the patient   Recent Lab Findings: Lab Results  Component Value Date   WBC 6.6 09/12/2023   HGB 10.2 (L) 09/12/2023   HCT 31.0 (L) 09/12/2023   PLT 176 09/12/2023   GLUCOSE 63 (L) 09/12/2023   CHOL 147 05/23/2023   TRIG 60 05/23/2023   HDL 81 05/23/2023   LDLCALC 54 05/23/2023   ALT 29 09/11/2023   AST 26 09/11/2023   NA 138 09/12/2023   K 2.9 (L) 09/12/2023   CL 99 09/12/2023   CREATININE 1.46 (H) 09/12/2023   BUN 15 09/12/2023   CO2 27 09/12/2023   TSH 4.120 09/11/2023   INR 1.7 (H) 08/18/2023   HGBA1C 6.6 (H) 08/20/2023      Assessment / Plan:      -Severe two-vessel coronary artery disease and moderate aortic stenosis in a 77 year old male currently being managed for acute on chronic systolic heart failure.  Most recent echo in  November of this year showed left ventricular ejection fraction of 30 to 35% with diffuse hypokinesis. A follow up ECHO has been ordered for this admission.  He has multiple comorbidities including stage III chronic kidney disease, atrial fibrillation, peripheral vascular disease, anemia with recent GI bleed related to erosive gastritis, and significant deconditioning from gradual functional decline over the past several months.  General descriptions of coronary bypass  grafting, aortic valve replacement, and the MAZE procedure were discussed with Connor Bryant.  He inferred that he would like to avoid open heart surgery if at all possible in favor of less invasive options if offered.  Dr. Cliffton Asters is aware of Mr. Ingoglia admission and clinical presentation.  Discussion with the interventional cardiologist is planned at the cath conference tomorrow.  Recommendations regarding potential surgical management will follow.  -Type 2 diabetes mellitus: Hemoglobin A1c 6.6  -Persistent atrial fibrillation: Rate controlled on amiodarone and carvedilol.  Was on apixaban prior to admission but currently on heparin.  -History of "CVA": He said the only manifestation of this was loss of peripheral vision in his left eye.  This may have been an embolic event to the retina rather than a central lesion.  -Peripheral vascular disease: Status post mesenteric artery bypass in 2015.:  Hospital and status post bilateral iliac artery stenting at [facility] in 2022 I am not able to palpate distal pulses but he has no evidence of acute ischemia in his extremities.  Will plan to get arterial duplex imaging of the lower extremities and carotid arteries if surgery is planned  -CKD, stage IIIa-  -Erosive gastritis: Diagnosed in November of this year when he presented with anemia, hemoglobin 7.5.  He was transfused with 2 units of packed red blood cells during that admission and has had no further evidence of GI blood losses. Hgb on  admission yesterday was 10.2.   I  spent 25 minutes counseling the patient face to face.   Leary Roca, PA-C  09/12/2023 9:18 AM   Case discussed in Cath conference.  Will plan for PCI/TAVR  Devarious Pavek O [place]word Cu

## 2023-09-12 NOTE — Progress Notes (Signed)
VASCULAR LAB    Carotid duplex has been performed.  See CV proc for preliminary results.   Sarahmarie Leavey, RVT 09/12/2023, 1:48 PM

## 2023-09-12 NOTE — Progress Notes (Signed)
Heart Failure Navigator Progress Note  Assessed for Heart & Vascular TOC clinic readiness.  Patient does not meet criteria due to Advanced Heart Failure Team patient of Dr. Violeta Gelinas.   Navigator will sign off at this time.,   Rhae Hammock, BSN, Scientist, clinical (histocompatibility and immunogenetics) Only

## 2023-09-13 DIAGNOSIS — I5023 Acute on chronic systolic (congestive) heart failure: Secondary | ICD-10-CM | POA: Diagnosis not present

## 2023-09-13 LAB — BASIC METABOLIC PANEL
Anion gap: 11 (ref 5–15)
BUN: 18 mg/dL (ref 8–23)
CO2: 29 mmol/L (ref 22–32)
Calcium: 8.2 mg/dL — ABNORMAL LOW (ref 8.9–10.3)
Chloride: 99 mmol/L (ref 98–111)
Creatinine, Ser: 1.82 mg/dL — ABNORMAL HIGH (ref 0.61–1.24)
GFR, Estimated: 38 mL/min — ABNORMAL LOW (ref >60)
Glucose, Bld: 120 mg/dL — ABNORMAL HIGH (ref 70–99)
Potassium: 4.1 mmol/L (ref 3.5–5.1)
Sodium: 139 mmol/L (ref 135–145)

## 2023-09-13 LAB — CBC
HCT: 31.5 % — ABNORMAL LOW (ref 39.0–52.0)
Hemoglobin: 10.2 g/dL — ABNORMAL LOW (ref 13.0–17.0)
MCH: 30.6 pg (ref 26.0–34.0)
MCHC: 32.4 g/dL (ref 30.0–36.0)
MCV: 94.6 fL (ref 80.0–100.0)
Platelets: 173 10*3/uL (ref 150–400)
RBC: 3.33 MIL/uL — ABNORMAL LOW (ref 4.22–5.81)
RDW: 17.1 % — ABNORMAL HIGH (ref 11.5–15.5)
WBC: 6 10*3/uL (ref 4.0–10.5)
nRBC: 0 % (ref 0.0–0.2)

## 2023-09-13 LAB — HEPARIN LEVEL (UNFRACTIONATED): Heparin Unfractionated: >1.1 [IU]/mL — ABNORMAL HIGH (ref 0.30–0.70)

## 2023-09-13 LAB — LIPOPROTEIN A (LPA): Lipoprotein (a): 69.7 nmol/L — ABNORMAL HIGH (ref <75.0)

## 2023-09-13 LAB — APTT
aPTT: 104 s — ABNORMAL HIGH (ref 24–36)
aPTT: 91 s — ABNORMAL HIGH (ref 24–36)

## 2023-09-13 LAB — MAGNESIUM: Magnesium: 2 mg/dL (ref 1.7–2.4)

## 2023-09-13 MED ORDER — SODIUM CHLORIDE 0.9 % IV SOLN: INTRAVENOUS | Status: AC

## 2023-09-13 MED ORDER — FUROSEMIDE 40 MG PO TABS
40.0000 mg | ORAL_TABLET | Freq: Every day | ORAL | Status: DC
  Administered 2023-09-13: 40 mg via ORAL
  Filled 2023-09-13: qty 1

## 2023-09-13 MED ORDER — FUROSEMIDE 20 MG PO TABS
20.0000 mg | ORAL_TABLET | Freq: Every day | ORAL | Status: DC
  Administered 2023-09-14: 20 mg via ORAL
  Filled 2023-09-13: qty 1

## 2023-09-13 NOTE — Progress Notes (Signed)
PHARMACY - ANTICOAGULATION CONSULT NOTE  Pharmacy Consult for UFH IV Infusion Indication: chest pain/ACS and atrial fibrillation  No Known Allergies  Patient Measurements: Height: 5\' 8"  (172.7 cm) Weight: 82.8 kg (182 lb 8.7 oz) (Scale A) IBW/kg (Calculated) : 68.4 Heparin Dosing Weight: 81.2 kg  Vital Signs: Temp: 97.3 F (36.3 C) (12/19 2340) Temp Source: Oral (12/19 2340) BP: 112/61 (12/19 2340) Pulse Rate: 68 (12/19 2340)  Labs: Recent Labs    09/11/23 1637 09/12/23 0210 09/12/23 0818 09/12/23 1135 09/12/23 1901 09/13/23 0231  HGB 10.3* 10.2*  --   --   --  10.2*  HCT 31.7* 31.0*  --   --   --  31.5*  PLT 182 176  --   --   --  173  APTT  --   --  64*  --  92* 104*  HEPARINUNFRC  --   --  >1.10*  --   --  >1.10*  CREATININE 1.48* 1.46*  --  1.48*  --   --     Estimated Creatinine Clearance: 43.2 mL/min (A) (by C-G formula based on SCr of 1.48 mg/dL (H)).   Assessment: 34 YOM PMH of CVA, persistent AF, and systolic CHF. On 11/24 pt was admitted to [facility] with anemia and hypotension with melena. Pt received 2 units of pRBC and remained hypotensive. Transferred to [facility]. Underwent LHC on 12/18 with severe prox LAD stenosis (99%) and suspected heavily calcified RCA (80%). Plans to evaluate for CABG or PCI with atherectomy at this time. Prior to admission, patient was on Eliquis for [facility] for AF. No current s/sx of bleeding per RN. Will start continuous AC with UFH IV infusion.  Heparin level came back elevated as expected >1.1 with recent DOAC. aPTT now 104 sec (slightly supra-therapeutic) on infusion at 1100 units/hr. No bleeding noted.  Goal of Therapy:  Heparin level 0.3-0.7 units/ml aPTT 66-102 seconds Monitor platelets by anticoagulation protocol: Yes   Plan:  Decrease heparin infusion to 1050 units/hr  aPTT in 8h Monitor aPTT and heparin level until correlate, then transition to heparin levels Daily CBC  Thank you for allowing pharmacy to participate in this  patient's care,  Christoper Fabian, PharmD, BCPS Please see amion for complete clinical pharmacist phone list 09/13/2023 3:25 AM

## 2023-09-13 NOTE — Heart Team MDD (Signed)
   Heart Team Multi-Disciplinary Discussion  Patient: Connor Bryant  DOB: [birth year]  MRN: [id number]   Date: 09/13/2023  10:12 AM    Attendees: Interventional Cardiology: Lorine Bears, MD Yates Decamp, MD Peter [place], MD Yvonne Kendall, MD Dorothyann Peng, MD  Cardiothoracic Surgery: Brynda Greathouse, MD  Advanced Heart Failure: Marca Ancona, MD    Patient History: 77 year old with history of CVA, persistent a-fib and systolic CHF. Admitted on 8/21 with CVA. Troponin elevated at 2500, EF 35% with apical kinesis. Echo on 10/21 showed EF of 60-65% with mild-moderate AS. 11/24, admitted for anemia and hypotension, melena present, Hgb 7.5. Echo showed EF 30-35%, diffuse hypokinesis. EGD revealed erosive gastritis. Pt reports shortness of breath with minimal ambulation.     Risk Factors: Hypertension Additional Risk Factors: GI bleed, A fib with RVR     Review of Prior Angiography and PCI Procedures: The Echo on 11/24 was reviewed and discussed in detail including, EF down to 30-35% with diffuse hypokinesis, normal RV, PASP 60 mmHg, mild-moderate MR, moderate AS. Additionally, the LHC/RHC images completed on 12/18 were reviewed and discussed in detail including the critical proximal LAD and severe ostial RCA stenoses, suggesting an ischemic cardiomyopathy.          Discussion: After presentation, consideration of treatment options occurred including CABG versus attempted PCI to LAD and RCA. After thorough discussion, the team consensus was to attempt PCI.  Of importance, the group recommended the use of shockwave with balloon and stenting during the PCI. Additionally, the patient will need to be on plavix and eliquis. If the PCI is unable to be completed, the group recommended referral to CTS for CABG.    Recommendations: PCI      Alison Murray, RN  09/13/2023 10:12 AM

## 2023-09-13 NOTE — Progress Notes (Signed)
PHARMACY - ANTICOAGULATION CONSULT NOTE  Pharmacy Consult for UFH IV Infusion Indication: chest pain/ACS and atrial fibrillation  No Known Allergies  Patient Measurements: Height: 5\' 8"  (172.7 cm) Weight: 82.3 kg (181 lb 7 oz) IBW/kg (Calculated) : 68.4 Heparin Dosing Weight: 81.2 kg  Vital Signs: Temp: 97.7 F (36.5 C) (12/20 1136) Temp Source: Oral (12/20 1136) BP: 115/68 (12/20 1136) Pulse Rate: 62 (12/20 1136)  Labs: Recent Labs    09/11/23 1637 09/12/23 0210 09/12/23 0818 09/12/23 0818 09/12/23 1135 09/12/23 1901 09/13/23 0231 09/13/23 1205  HGB 10.3* 10.2*  --   --   --   --  10.2*  --   HCT 31.7* 31.0*  --   --   --   --  31.5*  --   PLT 182 176  --   --   --   --  173  --   APTT  --   --  64*   < >  --  92* 104* 91*  HEPARINUNFRC  --   --  >1.10*  --   --   --  >1.10*  --   CREATININE 1.48* 1.46*  --   --  1.48*  --  1.82*  --    < > = values in this interval not displayed.    Estimated Creatinine Clearance: 35 mL/min (A) (by C-G formula based on SCr of 1.82 mg/dL (H)).   Assessment: 3 YOM PMH of CVA, persistent AF, and systolic CHF. On 11/24 pt was admitted to [facility] with anemia and hypotension with melena. Pt received 2 units of pRBC and remained hypotensive. Transferred to [facility] Underwent LHC on 12/18 with severe prox LAD stenosis (99%) and suspected heavily calcified RCA (80%). Plans to evaluate for CABG or PCI with atherectomy at this time. Prior to admission, patient was on Eliquis for [facility] for AF. No current s/sx of bleeding per RN. Will start continuous AC with UFH IV infusion.  aPTT is therapeutic at 91 sec, on heparin infusion at 1050 units/hr. Minor but stable bleeding at IV site. No infusion issues.  Goal of Therapy:  Heparin level 0.3-0.7 units/ml aPTT 66-102 seconds Monitor platelets by anticoagulation protocol: Yes   Plan:  Continue heparin infusion at 1050 units/hr  F/u aPTT and heparin level daily until correlate, CBC, and for s/sx of bleeding    Thank you for allowing pharmacy to participate in this patient's care,  Sherron Monday, PharmD, BCCCP Clinical Pharmacist  Phone: [phone] 09/13/2023 1:06 PM  Please check AMION for all [facility] phone numbers After 10:00 PM, call Main Pharmacy [phone]

## 2023-09-13 NOTE — Progress Notes (Signed)
Advanced Heart Failure Rounding Note  Cardiologist: Dina Rich, MD  Chief Complaint: Acute on Chronic Systolic Heart Failure  Subjective:    2L UOP. Intake incomplete. Weight stable.  Echo 12/20: EF 30-35%, mod reduced RV function, RVSP 37.4. Mild MR. Mod AS  Sitting up in bed, eating breakfast. Feeling fine this morning. Anxious about the decision of PCI vs. Open heart surgery today. No SOB, CP or dizziness.  Objective:   Weight Range: 82.3 kg Body mass index is 27.59 kg/m.   Vital Signs:   Temp:  [97.3 F (36.3 C)-98.3 F (36.8 C)] 97.5 F (36.4 C) (12/20 0417) Pulse Rate:  [63-80] 63 (12/20 0633) Resp:  [13-21] 13 (12/20 0417) BP: (103-134)/(57-87) 132/60 (12/20 0633) SpO2:  [92 %-97 %] 92 % (12/20 0417) Weight:  [82.3 kg] 82.3 kg (12/20 0417) Last BM Date : 09/11/23  Weight change: Filed Weights   09/11/23 1553 09/12/23 0533 09/13/23 0417  Weight: 81.2 kg 82.8 kg 82.3 kg    Intake/Output:   Intake/Output Summary (Last 24 hours) at 09/13/2023 0733 Last data filed at 09/13/2023 0324 Gross per 24 hour  Intake 957.29 ml  Output 2000 ml  Net -1042.71 ml   Physical Exam    General: Well appearing. No distress on RA HEENT: neck supple.   Cardiac: JVP ~8cm. S1 and S2 present. No murmurs or rub. Resp: Lung sounds clear and equal B/L Abdomen: Soft, non-tender, non-distended. + BS. Extremities: Warm and dry. No rash, cyanosis.  1-2+ BLE edema. Unna boots in place  Neuro: Alert and oriented x3. Affect pleasant. Moves all extremities without difficulty.  Telemetry   SR in 60s with notable left atrial enlargement (personally reviewed)  EKG    No new EKG to review  Labs    CBC Recent Labs    09/11/23 1637 09/12/23 0210 09/13/23 0231  WBC 5.2 6.6 6.0  NEUTROABS 3.1  --   --   HGB 10.3* 10.2* 10.2*  HCT 31.7* 31.0* 31.5*  MCV 94.9 94.2 94.6  PLT 182 176 173   Basic Metabolic Panel Recent Labs    11/91/47 0818 09/12/23 1135  09/13/23 0231  NA  --  136 139  K  --  4.2 4.1  CL  --  100 99  CO2  --  27 29  GLUCOSE  --  111* 120*  BUN  --  14 18  CREATININE  --  1.48* 1.82*  CALCIUM  --  7.9* 8.2*  MG 2.1  --  2.0   Liver Function Tests Recent Labs    09/11/23 1637  AST 26  ALT 29  ALKPHOS 68  BILITOT 1.7*  PROT 6.1*  ALBUMIN 3.0*   No results for input(s): "LIPASE", "AMYLASE" in the last 72 hours. Cardiac Enzymes No results for input(s): "CKTOTAL", "CKMB", "CKMBINDEX", "TROPONINI" in the last 72 hours.  BNP: BNP (last 3 results) Recent Labs    08/19/23 0213 09/03/23 1621 09/11/23 1637  BNP 1,228.1* 1,602.8* 1,478.1*   ProBNP (last 3 results) No results for input(s): "PROBNP" in the last 8760 hours.  D-Dimer No results for input(s): "DDIMER" in the last 72 hours. Hemoglobin A1C No results for input(s): "HGBA1C" in the last 72 hours. Fasting Lipid Panel No results for input(s): "CHOL", "HDL", "LDLCALC", "TRIG", "CHOLHDL", "LDLDIRECT" in the last 72 hours. Thyroid Function Tests Recent Labs    09/11/23 1637  TSH 4.120   Other results:  Imaging   VAS US CAROTID Result Date: 09/12/2023 Carotid Arterial Duplex  Study Patient Name:  Connor Bryant  Date of Exam:   09/12/2023 Medical Rec #: [id number]       Accession #:    [id number] Date of Birth: 1946-09-15       Patient Gender: M Patient Age:   59 years Exam Location:  [facility] Procedure:      VAS US CAROTID Referring Phys: Marca Ancona --------------------------------------------------------------------------------  Risk Factors:      Hypertension, hyperlipidemia, Diabetes, past history of                    smoking, coronary artery disease, prior CVA, PAD. Other Factors:     Paroxysmal atrial fibrillation. HFrEF. Mesenteric artery                    bypass in 6/15. Bilateral iliac artery stents in 8/21 at [facility]. Limitations        Today's exam was limited due to heavy calcification and the                     resulting shadowing. Comparison Study:  Prior Carotid duplex done 05/08/2023 indicating 1-39% right                    ICA stenosis and 40-59% left ICA stenosis (by systolic                    velocity). Performing Technologist: Sherren Kerns RVS  Examination Guidelines: A complete evaluation includes B-mode imaging, spectral Doppler, color Doppler, and power Doppler as needed of all accessible portions of each vessel. Bilateral testing is considered an integral part of a complete examination. Limited examinations for reoccurring indications may be performed as noted.  Right Carotid Findings: +----------+--------+--------+--------+--------------------------+---------+           PSV cm/sEDV cm/sStenosisPlaque Description        Comments  +----------+--------+--------+--------+--------------------------+---------+ CCA Prox  182     15              irregular and heterogenous          +----------+--------+--------+--------+--------------------------+---------+ CCA Mid   91      12              irregular and heterogenous          +----------+--------+--------+--------+--------------------------+---------+ CCA Distal83      14              irregular and heterogenous          +----------+--------+--------+--------+--------------------------+---------+ ICA Prox  108     14      1-39%   calcific and irregular    Shadowing +----------+--------+--------+--------+--------------------------+---------+ ICA Mid   107     28                                                  +----------+--------+--------+--------+--------------------------+---------+ ICA Distal103     28                                                  +----------+--------+--------+--------+--------------------------+---------+ ECA  167     1               calcific                            +----------+--------+--------+--------+--------------------------+---------+  +----------+--------+-------+--------+-------------------+           PSV cm/sEDV cmsDescribeArm Pressure (mmHG) +----------+--------+-------+--------+-------------------+ [id number]                                         +----------+--------+-------+--------+-------------------+ +---------+--------+--+--------+--+ VertebralPSV cm/s62EDV cm/s10 +---------+--------+--+--------+--+  Left Carotid Findings: +----------+--------+--------+--------+--------------------------+---------+           PSV cm/sEDV cm/sStenosisPlaque Description        Comments  +----------+--------+--------+--------+--------------------------+---------+ CCA Prox  41      12              irregular and heterogenous          +----------+--------+--------+--------+--------------------------+---------+ CCA Distal69      14              irregular and heterogenous          +----------+--------+--------+--------+--------------------------+---------+ ICA Prox  154     32      40-59%                            Shadowing +----------+--------+--------+--------+--------------------------+---------+ ICA Mid   88      22                                        Shadowing +----------+--------+--------+--------+--------------------------+---------+ ICA Distal64      14                                                  +----------+--------+--------+--------+--------------------------+---------+ ECA       112     6               calcific                            +----------+--------+--------+--------+--------------------------+---------+ +----------+--------+--------+--------+-------------------+           PSV cm/sEDV cm/sDescribeArm Pressure (mmHG) +----------+--------+--------+--------+-------------------+ [id number]                                          +----------+--------+--------+--------+-------------------+ +---------+--------+--+--------+--+ VertebralPSV cm/s47EDV  cm/s14 +---------+--------+--+--------+--+   Summary: Right Carotid: Velocities in the right ICA are consistent with a 1-39% stenosis.                Non-hemodynamically significant plaque <50% noted in the CCA. Left Carotid: Systolic velocities and waveforms distal to the stenosis are suggestive of at least 40-59% left ICA stenosis. Higher velocities may be obscured by plaque formation and acoustic shadowing.  Non-hemodynamically significant plaque <50% noted in the CCA. Vertebrals:  Bilateral vertebral arteries demonstrate antegrade flow. Subclavians: Normal flow hemodynamics were seen in bilateral subclavian              arteries. *See table(s)  above for measurements and observations.     Preliminary    ECHOCARDIOGRAM COMPLETE Result Date: 09/12/2023    ECHOCARDIOGRAM REPORT   Patient Name:   Connor Bryant Date of Exam: 09/12/2023 Medical Rec #:  [id number]      Height:       68.0 in Accession #:    [id number]     Weight:       182.5 lb Date of Birth:  08/21/1946      BSA:          1.966 m Patient Age:    35 years       BP:           103/87 mmHg Patient Gender: M              HR:           56 bpm. Exam Location:  Inpatient Procedure: 2D Echo, Color Doppler, Cardiac Doppler and Intracardiac            Opacification Agent Indications:    I50.21 Acute systolic (congestive) heart failure  History:        Patient has prior history of Echocardiogram examinations, most                 recent 08/20/2023. CHF, Arrythmias:Atrial Fibrillation; Risk                 Factors:Hypertension, Diabetes and Dyslipidemia.  Sonographer:    Irving Burton Senior RDCS Referring Phys: [phone] DALTON S MCLEAN IMPRESSIONS  1. Left ventricular ejection fraction, by estimation, is 30 to 35%. The left ventricle has moderately decreased function. The left ventricle demonstrates global hypokinesis. There is mild left ventricular hypertrophy. Left ventricular diastolic parameters are indeterminate.  2. Right ventricular systolic function is moderately  reduced. The right ventricular size is normal. There is mildly elevated pulmonary artery systolic pressure. The estimated right ventricular systolic pressure is 37.4 mmHg.  3. Left atrial size was moderately dilated.  4. Right atrial size was mildly dilated.  5. The mitral valve is degenerative. Mild mitral valve regurgitation.  6. The aortic valve is calcified. Aortic valve regurgitation is not visualized. Moderate aortic valve stenosis.  7. The inferior vena cava is normal in size with <50% respiratory variability, suggesting right atrial pressure of 8 mmHg. FINDINGS  Left Ventricle: Left ventricular ejection fraction, by estimation, is 30 to 35%. The left ventricle has moderately decreased function. The left ventricle demonstrates global hypokinesis. Definity contrast agent was given IV to delineate the left ventricular endocardial borders. The left ventricular internal cavity size was normal in size. There is mild left ventricular hypertrophy. Left ventricular diastolic parameters are indeterminate. Right Ventricle: The right ventricular size is normal. Right ventricular systolic function is moderately reduced. There is mildly elevated pulmonary artery systolic pressure. The tricuspid regurgitant velocity is 2.71 m/s, and with an assumed right atrial pressure of 8 mmHg, the estimated right ventricular systolic pressure is 37.4 mmHg. Left Atrium: Left atrial size was moderately dilated. Right Atrium: Right atrial size was mildly dilated. Pericardium: There is no evidence of pericardial effusion. Mitral Valve: The mitral valve is degenerative in appearance. Mild mitral valve regurgitation. MV peak gradient, 7.5 mmHg. The mean mitral valve gradient is 2.0 mmHg. Tricuspid Valve: Tricuspid valve regurgitation is mild. Aortic Valve: The aortic valve is calcified. Aortic valve regurgitation is not visualized. Moderate aortic stenosis is present. Aortic valve mean gradient measures 7.0 mmHg. Aortic valve peak gradient  measures 12.8 mmHg. Aortic valve area,  by VTI measures 0.98 cm. Pulmonic Valve: Pulmonic valve regurgitation is not visualized. Aorta: The aortic root and ascending aorta are structurally normal, with no evidence of dilitation. Venous: The inferior vena cava is normal in size with less than 50% respiratory variability, suggesting right atrial pressure of 8 mmHg. IAS/Shunts: No atrial level shunt detected by color flow Doppler.  LEFT VENTRICLE PLAX 2D LVIDd:         4.30 cm      Diastology LVIDs:         3.70 cm      LV e' medial:  4.68 cm/s LV PW:         1.00 cm      LV e' lateral: 9.57 cm/s LV IVS:        1.10 cm LVOT diam:     2.00 cm LV SV:         40 LV SV Index:   20 LVOT Area:     3.14 cm  LV Volumes (MOD) LV vol d, MOD A2C: 126.0 ml LV vol d, MOD A4C: 140.0 ml LV vol s, MOD A2C: 78.4 ml LV vol s, MOD A4C: 83.3 ml LV SV MOD A2C:     47.6 ml LV SV MOD A4C:     140.0 ml LV SV MOD BP:      51.2 ml RIGHT VENTRICLE RV S prime:     5.77 cm/s TAPSE (M-mode): 1.3 cm LEFT ATRIUM             Index        RIGHT ATRIUM           Index LA diam:        4.50 cm 2.29 cm/m   RA Area:     21.60 cm LA Vol (A2C):   70.9 ml 36.06 ml/m  RA Volume:   62.70 ml  31.89 ml/m LA Vol (A4C):   90.8 ml 46.19 ml/m LA Biplane Vol: 81.3 ml 41.35 ml/m  AORTIC VALVE AV Area (Vmax):    1.09 cm AV Area (Vmean):   1.10 cm AV Area (VTI):     0.98 cm AV Vmax:           179.00 cm/s AV Vmean:          128.000 cm/s AV VTI:            0.409 m AV Peak Grad:      12.8 mmHg AV Mean Grad:      7.0 mmHg LVOT Vmax:         62.30 cm/s LVOT Vmean:        44.700 cm/s LVOT VTI:          0.127 m LVOT/AV VTI ratio: 0.31  AORTA Ao Root diam: 3.00 cm Ao Asc diam:  3.50 cm MITRAL VALVE                  TRICUSPID VALVE MV Area VTI:  1.16 cm        TR Peak grad:   29.4 mmHg MV Peak grad: 7.5 mmHg        TR Vmax:        271.00 cm/s MV Mean grad: 2.0 mmHg MV Vmax:      1.37 m/s        SHUNTS MV Vmean:     64.1 cm/s       Systemic VTI:  0.13 m MR Peak grad:     59.8 mmHg  Systemic Diam: 2.00 cm MR Mean grad:    45.0 mmHg MR Vmax:         386.50 cm/s MR Vmean:        320.0 cm/s MR PISA:         1.57 cm MR PISA Eff ROA: 13 mm MR PISA Radius:  0.50 cm Carolan Clines Electronically signed by Carolan Clines Signature Date/Time: 09/12/2023/1:55:51 PM    Final    Medications:    Scheduled Medications:  allopurinol  100 mg Oral BID   amiodarone  200 mg Oral Daily   aspirin  81 mg Oral Daily   atorvastatin  80 mg Oral Daily   carvedilol  12.5 mg Oral BID WC   dapagliflozin propanediol  10 mg Oral QAC breakfast   feeding supplement  237 mL Oral BID BM   ferrous sulfate  325 mg Oral Daily   folic acid  1 mg Oral Daily   Living Better with Heart Failure Book   Does not apply Once   pantoprazole  40 mg Oral BID   polyethylene glycol  17 g Oral Daily   sodium chloride flush  3 mL Intravenous Q12H   sodium chloride flush  3 mL Intravenous Q12H   spironolactone  25 mg Oral Daily    Infusions:  heparin 1,050 Units/hr (09/13/23 0333)    PRN Medications: acetaminophen, docusate sodium, ondansetron (ZOFRAN) IV, sodium chloride flush, sodium chloride flush  Patient Profile   Connor Bryant is a 77 y.o. male with past medical history of CVA, persistent Afib, and chronic systolic heart failure.   Assessment/Plan   1. Acute on chronic systolic CHF: Drop in EF in 8/21 to 35% with apical akinesis in setting CVA with improvement to normal EF by 10/21. This was thought to be due to stress cardiomyopathy.  Echo in 11/24 showed EF back down to EF 30-35% with diffuse hypokinesis, normal RV, PASP 60 mmHg, mild-moderate MR, moderate AS. This was in the setting of atrial fibrillation and anemia. I did not think that atrial fibrillation was the cause of this as rate was well-controlled (not tachycardia-mediated). LHC/RHC 12/18 showed critical proximal LAD and severe ostial RCA stenoses, suggesting an ischemic cardiomyopathy. Mildly elevated RA pressure, elevated PCWP with  prominent v-waves, normal LVEDP. Simultaneous LV/PCWP pressures were not measured, but PCWP 29 and LVEDP 11 suggests gradient across mitral valve with significant mitral stenosis. Moderate pulmonary venous hypertension. Low but not markedly low cardiac output (CI 2.23 Fick, 2.03 thermo).   - Hold diuresis for possible cath intervention v. CABG. Appears slightly volume up on exam. Unna boots in place - If deemed surgical candidate, will hold Farxiga 10 mg - On spiro 25 mg daily.  - Echo yesterday EF 30-35%, mod reduced RV function. Mild MR and Mod AS.  - TCTS evaluation for CABG underway. If deemed too high risk, will need to consider PCI with atherectomy (see below).  2. CAD: Patient has not had any chest pain. He has had exertional dyspnea. Echo in 11/24 showed EF down to 30-35%. Cath 12/18 showed severe, heavily calcified proximal LAD stenosis after D1 and S1 (99%) with TIMI 2 flow down the distal LAD. Suspect 80% heavily calcified ostial RCA stenosis. Suspect ischemic cardiomyopathy.   - CABG v. High risk PCI to LAD and RCA with atherectomy. Patient states he would prefer stenting to open heart surgery if at all possible. He is to be discussed at cath conference today. - Carotid studies completed - Continue statin +  ASA 81 daily.  3. Atrial fibrillation: Persistent. He is back on Eliquis after recent GI bleeding from erosive gastritis. He was started on amiodarone at 11/24 admission. I would like to get him back into NSR eventually.  - Hold Eliquis for now pending evaluation CABG. On heparin gtt.  - Continue amiodarone 200 mg daily, LFTs okay. - If he has CABG, would like him to have Maze as well. - If no CABG/Maze, will need eventual DCCV.  - If he has further GI bleeding, would consider Watchman for him.  4. CVA: 8/21, suspect due to atrial fibrillation.   5. PE: In 8/21, he is on Eliquis.  6. Aortic stenosis: AS looked moderate on 11/24 echo. S2 can be heard clearly. However, aortic valve was  easily crossed on cath 09/11/23 and peak-to-peak gradient was only 8 mmHg.  - Mod stenosis present on Echo 09/12/23 7. PAD: H/o iliac stents and mesenteric artery bypass. No claudication or intestinal angina. No pedal ulcerations.  - Continue statin.  8. Carotid stenosis: Repeat carotid dopplers today.   9. GI bleeding: 11/24 GI bleeding from erosive esophagitis. CBC has been stable recently with no overt bleeding.  - Avoid ETOH.  10. CKD stage 3: sCr at baseline ~1.4-1.7. Follow BMET closely.   - bump in sCr 1.48>1.82. 11. Mitral stenosis: Mild mitral stenosis on 11/24 echo with mean gradient 5 mmHg.  On cath 09/11/23, simultaneous LV/PCWP pressures were not measured, but PCWP 29 and LVEDP 11 suggests gradient across mitral valve with significant mitral stenosis.  - no mitral stenosis noted on 12/24 echo. Mild MR. 12. Hypokalemia - K 4.1 this am  Length of Stay: 2  [place] Modesta Sammons, NP  09/13/2023, 7:33 AM  Advanced Heart Failure Team Pager [phone] (M-F; 7a - 5p)  Please contact [facility] for night-coverage after hours (5p -7a ) and weekends on [email or web address]

## 2023-09-13 NOTE — Progress Notes (Signed)
Mobility Specialist Progress Note:    09/13/23 1000  Mobility  Activity Ambulated with assistance in room;Transferred from bed to chair  Level of Assistance Contact guard assist, steadying assist  Assistive Device Front wheel walker  Distance Ambulated (ft) 20 ft  Activity Response Tolerated well  Mobility Referral Yes  Mobility visit 1 Mobility  Mobility Specialist Start Time (ACUTE ONLY) [id number]  Mobility Specialist Stop Time (ACUTE ONLY) 0908  Mobility Specialist Time Calculation (min) (ACUTE ONLY) 15 min   Pt received in bed, agreeable to mobility. Able to stand and ambulate around bed w/ CG. No complaints throughout. Pt left in chair with call bell and all needs met.  [place] Earlene Plater Mobility Specialist Please contact via Special educational needs teacher or Rehab office at [phone]

## 2023-09-13 NOTE — Progress Notes (Signed)
Orthopedic Tech Progress Note Patient Details:  Connor Bryant Jul 06, 1946 [id number]  After applying UNNA BOOTS I placed a pillow under patient heels to float legs/feet, also got patient a fresh cup of ice water   Ortho Devices Type of Ortho Device: Ace wrap, Unna boot Ortho Device/Splint Location: BLE Ortho Device/Splint Interventions: Ordered, Application, Adjustment   Post Interventions Patient Tolerated: Well Instructions Provided: Care of device  Connor Bryant 09/13/2023, 6:04 AM

## 2023-09-13 NOTE — Progress Notes (Signed)
Mobility Specialist Progress Note:    09/13/23 1015  Mobility  Activity Transferred from chair to bed  Level of Assistance Contact guard assist, steadying assist  Assistive Device Front wheel walker  Distance Ambulated (ft) 4 ft  Activity Response Tolerated well  Mobility Referral Yes  Mobility visit 1 Mobility  Mobility Specialist Start Time (ACUTE ONLY) 0950  Mobility Specialist Stop Time (ACUTE ONLY) 0957  Mobility Specialist Time Calculation (min) (ACUTE ONLY) 7 min   Pt received in chair, requesting assistance back to bed. No complaints throughout. Pt left in bed with call bell and all needs met.  Connor Bryant Mobility Specialist Please contact via Special educational needs teacher or Rehab office at [phone]

## 2023-09-14 DIAGNOSIS — I5022 Chronic systolic (congestive) heart failure: Secondary | ICD-10-CM | POA: Diagnosis not present

## 2023-09-14 DIAGNOSIS — I5023 Acute on chronic systolic (congestive) heart failure: Secondary | ICD-10-CM | POA: Diagnosis present

## 2023-09-14 LAB — BASIC METABOLIC PANEL
Anion gap: 9 (ref 5–15)
BUN: 24 mg/dL — ABNORMAL HIGH (ref 8–23)
CO2: 28 mmol/L (ref 22–32)
Calcium: 8.3 mg/dL — ABNORMAL LOW (ref 8.9–10.3)
Chloride: 98 mmol/L (ref 98–111)
Creatinine, Ser: 2.29 mg/dL — ABNORMAL HIGH (ref 0.61–1.24)
GFR, Estimated: 29 mL/min — ABNORMAL LOW (ref >60)
Glucose, Bld: 92 mg/dL (ref 70–99)
Potassium: 3.7 mmol/L (ref 3.5–5.1)
Sodium: 135 mmol/L (ref 135–145)

## 2023-09-14 LAB — CBC
HCT: 30.4 % — ABNORMAL LOW (ref 39.0–52.0)
Hemoglobin: 9.8 g/dL — ABNORMAL LOW (ref 13.0–17.0)
MCH: 30.3 pg (ref 26.0–34.0)
MCHC: 32.2 g/dL (ref 30.0–36.0)
MCV: 94.1 fL (ref 80.0–100.0)
Platelets: 171 10*3/uL (ref 150–400)
RBC: 3.23 MIL/uL — ABNORMAL LOW (ref 4.22–5.81)
RDW: 16.9 % — ABNORMAL HIGH (ref 11.5–15.5)
WBC: 5.9 10*3/uL (ref 4.0–10.5)
nRBC: 0 % (ref 0.0–0.2)

## 2023-09-14 LAB — MAGNESIUM: Magnesium: 1.7 mg/dL (ref 1.7–2.4)

## 2023-09-14 LAB — HEPARIN LEVEL (UNFRACTIONATED): Heparin Unfractionated: 0.95 [IU]/mL — ABNORMAL HIGH (ref 0.30–0.70)

## 2023-09-14 LAB — APTT
aPTT: 102 s — ABNORMAL HIGH (ref 24–36)
aPTT: 69 s — ABNORMAL HIGH (ref 24–36)

## 2023-09-14 MED ORDER — POTASSIUM CHLORIDE CRYS ER 20 MEQ PO TBCR
20.0000 meq | EXTENDED_RELEASE_TABLET | Freq: Once | ORAL | Status: AC
  Administered 2023-09-14: 20 meq via ORAL
  Filled 2023-09-14: qty 1

## 2023-09-14 MED ORDER — MAGNESIUM SULFATE 2 GM/50ML IV SOLN
2.0000 g | Freq: Once | INTRAVENOUS | Status: AC
  Administered 2023-09-14: 2 g via INTRAVENOUS
  Filled 2023-09-14: qty 50

## 2023-09-14 NOTE — Progress Notes (Signed)
PHARMACY - ANTICOAGULATION CONSULT NOTE  Pharmacy Consult for UFH IV Infusion Indication: chest pain/ACS and atrial fibrillation  No Known Allergies  Patient Measurements: Height: 5\' 8"  (172.7 cm) Weight: 81.7 kg (180 lb 1.9 oz) IBW/kg (Calculated) : 68.4 Heparin Dosing Weight: 81.2 kg  Vital Signs: Temp: 97.9 F (36.6 C) (12/21 1056) Temp Source: Oral (12/21 1056) BP: 111/59 (12/21 1056) Pulse Rate: 61 (12/21 1056)  Labs: Recent Labs    09/12/23 0210 09/12/23 0818 09/12/23 1135 09/12/23 1901 09/13/23 0231 09/13/23 1205 09/14/23 0211  HGB 10.2*  --   --   --  10.2*  --  9.8*  HCT 31.0*  --   --   --  31.5*  --  30.4*  PLT 176  --   --   --  173  --  171  APTT  --  64*  --    < > 104* 91* 102*  HEPARINUNFRC  --  >1.10*  --   --  >1.10*  --  0.95*  CREATININE 1.46*  --  1.48*  --  1.82*  --  2.29*   < > = values in this interval not displayed.    Estimated Creatinine Clearance: 25.7 mL/min (A) (by C-G formula based on SCr of 2.29 mg/dL (H)).   Assessment: 40 YOM PMH of CVA, persistent AF, and systolic CHF. On 11/24 pt was admitted to [facility] with anemia and hypotension with melena. Pt received 2 units of pRBC and remained hypotensive. Transferred to [facility]. Underwent LHC on 12/18 with severe prox LAD stenosis (99%) and suspected heavily calcified RCA (80%). Plans to evaluate for CABG or PCI with atherectomy at this time. Prior to admission, patient was on Eliquis for [facility] for AF. No current s/sx of bleeding per RN. Will start continuous AC with UFH IV infusion.  aPTT is on high end of therapeutic at 102 seconds on UFH 1050 units/hour. Hgb is lower from 10.2 to 9.8, Plt stable, no signs of bleeding  Goal of Therapy:  Heparin level 0.3-0.7 units/ml aPTT 66-102 seconds Monitor platelets by anticoagulation protocol: Yes   Plan:  Reduce heparin infusion to 950 units/hr  Check 8 hour aPTT and HL F/u aPTT and heparin level daily until correlate, CBC, and for s/sx of bleeding    Thank you for allowing pharmacy to participate in this patient's care,  Wilmer Floor, PharmD PGY2 Cardiology Pharmacy Resident 09/14/2023 12:04 PM

## 2023-09-14 NOTE — Progress Notes (Signed)
PHARMACY - ANTICOAGULATION CONSULT NOTE  Pharmacy Consult for UFH IV Infusion Indication: chest pain/ACS and atrial fibrillation  No Known Allergies  Patient Measurements: Height: 5\' 8"  (172.7 cm) Weight: 81.7 kg (180 lb 1.9 oz) IBW/kg (Calculated) : 68.4 Heparin Dosing Weight: 81.2 kg  Vital Signs: Temp: 97.9 F (36.6 C) (12/21 1056) Temp Source: Oral (12/21 1056) BP: 111/59 (12/21 1056) Pulse Rate: 61 (12/21 1056)  Labs: Recent Labs    09/12/23 0210 09/12/23 0818 09/12/23 1135 09/12/23 1901 09/13/23 0231 09/13/23 1205 09/14/23 0211 09/14/23 1444  HGB 10.2*  --   --   --  10.2*  --  9.8*  --   HCT 31.0*  --   --   --  31.5*  --  30.4*  --   PLT 176  --   --   --  173  --  171  --   APTT  --  64*  --    < > 104* 91* 102* 69*  HEPARINUNFRC  --  >1.10*  --   --  >1.10*  --  0.95*  --   CREATININE 1.46*  --  1.48*  --  1.82*  --  2.29*  --    < > = values in this interval not displayed.    Estimated Creatinine Clearance: 25.7 mL/min (A) (by C-G formula based on SCr of 2.29 mg/dL (H)).   Assessment: 20 YOM PMH of CVA, persistent AF, and systolic CHF. On 11/24 pt was admitted to [facility] with anemia and hypotension with melena. Pt received 2 units of pRBC and remained hypotensive. Transferred to [facility]. Underwent LHC on 12/18 with severe prox LAD stenosis (99%) and suspected heavily calcified RCA (80%). Plans to evaluate for CABG or PCI with atherectomy at this time. Prior to admission, patient was on Eliquis for [facility] for AF.   aPTT is on high end of therapeutic at 102 seconds on UFH 1050 units/hour. Hgb is lower from 10.2 to 9.8, Plt stable, no signs of bleeding  12/21 PM update: aPTT 69 sec now on lower end of therapeutic range after dropping rate to 950 units/hr.    Goal of Therapy:  Heparin level 0.3-0.7 units/ml aPTT 66-102 seconds Monitor platelets by anticoagulation protocol: Yes   Plan:  Continue heparin infusion at 950 units/hr  F/u aPTT and heparin level daily until  correlate, CBC, and for s/sx of bleeding   Thank you for allowing pharmacy to participate in this patient's care,  Trixie Rude, PharmD Clinical Pharmacist 09/14/2023  3:28 PM

## 2023-09-14 NOTE — Progress Notes (Signed)
Mobility Specialist Progress Note:    09/14/23 1203  Mobility  Activity Transferred from bed to chair  Level of Assistance Minimal assist, patient does 75% or more  Assistive Device Front wheel walker  Distance Ambulated (ft) 5 ft  Activity Response Tolerated well  Mobility Referral Yes  Mobility visit 1 Mobility  Mobility Specialist Start Time (ACUTE ONLY) 0910  Mobility Specialist Stop Time (ACUTE ONLY) [id number]  Mobility Specialist Time Calculation (min) (ACUTE ONLY) 13 min   Pt received in bed hesitant but agreeable to mobility. Pt was able to perform bed mobility w/o physical assistance. For STS pt required MinA. Was able to take a couple steps towards the chair w/o fault. Call bell and personal belongings in reach. All needs met.  Thompson Grayer Mobility Specialist  Please contact vis Secure Chat or  Rehab Office [phone]

## 2023-09-14 NOTE — Plan of Care (Signed)
  Problem: Activity: Goal: Ability to return to baseline activity level will improve Outcome: Progressing   Problem: Education: Goal: Knowledge of General Education information will improve Description: Including pain rating scale, medication(s)/side effects and non-pharmacologic comfort measures Outcome: Progressing   Problem: Health Behavior/Discharge Planning: Goal: Ability to manage health-related needs will improve Outcome: Progressing   Problem: Clinical Measurements: Goal: Cardiovascular complication will be avoided Outcome: Progressing

## 2023-09-14 NOTE — Progress Notes (Signed)
Advanced Heart Failure Rounding Note  Cardiologist: Dina Rich, MD  Chief Complaint: Acute on Chronic Systolic Heart Failure  Subjective:    Pt appears comfortable  Sitting in chair   Objective:   Weight Range: 81.7 kg Body mass index is 27.39 kg/m.   Vital Signs:   Temp:  [97.6 F (36.4 C)-98.2 F (36.8 C)] 98.2 F (36.8 C) (12/21 0743) Pulse Rate:  [60-70] 60 (12/21 0743) Resp:  [11-20] 17 (12/21 0743) BP: (98-141)/(56-76) 101/56 (12/21 0743) SpO2:  [90 %-97 %] 96 % (12/21 0743) Weight:  [81.7 kg] 81.7 kg (12/21 0540) Last BM Date : 09/11/23  Weight change: Filed Weights   09/12/23 0533 09/13/23 0417 09/14/23 0540  Weight: 82.8 kg 82.3 kg 81.7 kg    Intake/Output:   Intake/Output Summary (Last 24 hours) at 09/14/2023 0756 Last data filed at 09/14/2023 0745 Gross per 24 hour  Intake 459.44 ml  Output 1500 ml  Net -1040.56 ml   Physical Exam    General: Well appearing. No distress on RA HEENT: neck supple.   Cardiac: JVP not elevated  S1 and S2 present. No murmurs  Resp: Lung sounds clear and equal B/L Abdomen: Soft, non-tender, non-distended. + BS. Extremities: Warm and dry. No rash, cyanosis.  Tr to 1+ BLE edema. Wrapped  Neuro: Alert and oriented x3. Affect pleasant. Moves all extremities without difficulty.  Telemetry   SR   EKG    No new EKG to review  Labs    CBC Recent Labs    09/11/23 1637 09/12/23 0210 09/13/23 0231 09/14/23 0211  WBC 5.2   < > 6.0 5.9  NEUTROABS 3.1  --   --   --   HGB 10.3*   < > 10.2* 9.8*  HCT 31.7*   < > 31.5* 30.4*  MCV 94.9   < > 94.6 94.1  PLT 182   < > 173 171   < > = values in this interval not displayed.   Basic Metabolic Panel Recent Labs    16/10/96 0231 09/14/23 0211  NA 139 135  K 4.1 3.7  CL 99 98  CO2 29 28  GLUCOSE 120* 92  BUN 18 24*  CREATININE 1.82* 2.29*  CALCIUM 8.2* 8.3*  MG 2.0 1.7   Liver Function Tests Recent Labs    09/11/23 1637  AST 26  ALT 29  ALKPHOS  68  BILITOT 1.7*  PROT 6.1*  ALBUMIN 3.0*   No results for input(s): "LIPASE", "AMYLASE" in the last 72 hours. Cardiac Enzymes No results for input(s): "CKTOTAL", "CKMB", "CKMBINDEX", "TROPONINI" in the last 72 hours.  BNP: BNP (last 3 results) Recent Labs    08/19/23 0213 09/03/23 1621 09/11/23 1637  BNP 1,228.1* 1,602.8* 1,478.1*   ProBNP (last 3 results) No results for input(s): "PROBNP" in the last 8760 hours.  D-Dimer No results for input(s): "DDIMER" in the last 72 hours. Hemoglobin A1C No results for input(s): "HGBA1C" in the last 72 hours. Fasting Lipid Panel No results for input(s): "CHOL", "HDL", "LDLCALC", "TRIG", "CHOLHDL", "LDLDIRECT" in the last 72 hours. Thyroid Function Tests Recent Labs    09/11/23 1637  TSH 4.120   Other results:  Imaging   No results found.  Medications:    Scheduled Medications:  allopurinol  100 mg Oral BID   amiodarone  200 mg Oral Daily   aspirin  81 mg Oral Daily   atorvastatin  80 mg Oral Daily   carvedilol  12.5 mg Oral BID WC  dapagliflozin propanediol  10 mg Oral QAC breakfast   feeding supplement  237 mL Oral BID BM   ferrous sulfate  325 mg Oral Daily   folic acid  1 mg Oral Daily   furosemide  20 mg Oral Daily   Living Better with Heart Failure Book   Does not apply Once   pantoprazole  40 mg Oral BID   polyethylene glycol  17 g Oral Daily   potassium chloride  20 mEq Oral Once   sodium chloride flush  3 mL Intravenous Q12H   sodium chloride flush  3 mL Intravenous Q12H   spironolactone  25 mg Oral Daily    Infusions:  sodium chloride     heparin 1,050 Units/hr (09/14/23 0019)   magnesium sulfate bolus IVPB      PRN Medications: acetaminophen, docusate sodium, ondansetron (ZOFRAN) IV, sodium chloride flush, sodium chloride flush  Patient Profile   Connor Bryant is a 77 y.o. male with past medical history of CVA, persistent Afib, and chronic systolic heart failure.   Assessment/Plan   1. Acute  on chronic systolic CHF:  Drop in EF in 8/21 to 35% with apical akinesis in setting CVA with improvement to normal EF by 10/21. This was thought to be due to stress cardiomyopathy.  Echo in 11/24 showed EF back down to EF 30-35% with diffuse hypokinesis, normal RV, PASP 60 mmHg, mild-moderate MR, moderate AS. This was in the setting of atrial fibrillation and anemia. I did not think that atrial fibrillation was the cause of this as rate was well-controlled (not tachycardia-mediated).   LHC/RHC 12/18 showed critical proximal LAD and severe ostial RCA stenoses, suggesting an ischemic cardiomyopathy. Mildly elevated RA pressure, elevated PCWP with prominent v-waves, normal LVEDP. Simultaneous LV/PCWP pressures were not measured, but PCWP 29 and LVEDP 11 suggests gradient across mitral valve with significant mitral stenosis. Moderate pulmonary venous hypertension. Low but not markedly low cardiac output (CI 2.23 Fick, 2.03 thermo).   Volume status overall not too bad  Mildly increased Lasix started today  PO  20 mg    Follow  On spironolactone and farxiga and carvedilol Follow renal function and eam   - Last echo, EF 30-35%, mod reduced RV function. Mild MR and Mod AS.  -  2. CAD: Patient has not had any chest pain. He has had exertional dyspnea. Echo in 11/24 showed EF down to 30-35%.  Cath 12/18 showed severe, heavily calcified proximal LAD stenosis after D1 and S1 (99%) with TIMI 2 flow down the distal LAD. Suspect 80% heavily calcified ostial RCA stenosis. Suspect ischemic cardiomyopathy.   -After review with interventional, surgery plan is for PCI on Monday   - Continue statin + ASA 81 daily.   3. Atrial fibrillation: Persistent. He is back on Eliquis after recent GI bleeding from erosive gastritis. He was started on amiodarone at 11/24 admission. Tele difficult to see   Will get EKG  ? Afib vs SR with pacs  - Hold Eliquis for now  On heparin gtt.  - Continue amiodarone 200 mg daily, LFTs  okay. - If he has further GI bleeding, would consider Watchman for him.   4. CVA: 8/21, suspect due to atrial fibrillation.    5. PE: In 8/21, he is on Heparin   6. Aortic stenosis: AS looked moderate on 12/24 echo.  However, aortic valve was easily crossed on cath 09/11/23 and peak-to-peak gradient was only 8 mmHg.   7. PAD: H/o iliac stents and mesenteric artery  bypass.  - Continue statin.   8. Carotid stenosis: Repeat carotid dopplers Mlld RICA plaquing  MIld to moderate LICA     9. GI bleeding: 11/24 GI bleeding from erosive esophagitis. CBC has been stable recently with no overt bleeding.  - Avoid ETOH.  10. CKD stage 3:  Cr this am was 2.29   Will hold lasix and spironolactone  sCr at baseline ~1.4-1.7. Follow BMET closely.    11. Mitral stenosis: Mild mitral stenosis on 11/24 echo with mean gradient 5 mmHg.  On cath 09/11/23, simultaneous LV/PCWP pressures were not measured, but PCWP 29 and LVEDP 11 suggests gradient across mitral valve with significant mitral stenosis.  - no mitral stenosis noted on 12/24 echo. Mild MR. 12. Hypokalemia - K 3.7 this am  Length of Stay: 3  Dietrich Pates, MD  09/14/2023, 7:56 AM

## 2023-09-15 DIAGNOSIS — I2510 Atherosclerotic heart disease of native coronary artery without angina pectoris: Secondary | ICD-10-CM

## 2023-09-15 LAB — APTT: aPTT: 81 s — ABNORMAL HIGH (ref 24–36)

## 2023-09-15 LAB — CBC
HCT: 32.7 % — ABNORMAL LOW (ref 39.0–52.0)
Hemoglobin: 10.6 g/dL — ABNORMAL LOW (ref 13.0–17.0)
MCH: 30.5 pg (ref 26.0–34.0)
MCHC: 32.4 g/dL (ref 30.0–36.0)
MCV: 94 fL (ref 80.0–100.0)
Platelets: 186 10*3/uL (ref 150–400)
RBC: 3.48 MIL/uL — ABNORMAL LOW (ref 4.22–5.81)
RDW: 16.9 % — ABNORMAL HIGH (ref 11.5–15.5)
WBC: 6.2 10*3/uL (ref 4.0–10.5)
nRBC: 0 % (ref 0.0–0.2)

## 2023-09-15 LAB — BASIC METABOLIC PANEL
Anion gap: 11 (ref 5–15)
BUN: 22 mg/dL (ref 8–23)
CO2: 25 mmol/L (ref 22–32)
Calcium: 8.7 mg/dL — ABNORMAL LOW (ref 8.9–10.3)
Chloride: 100 mmol/L (ref 98–111)
Creatinine, Ser: 1.89 mg/dL — ABNORMAL HIGH (ref 0.61–1.24)
GFR, Estimated: 36 mL/min — ABNORMAL LOW (ref >60)
Glucose, Bld: 93 mg/dL (ref 70–99)
Potassium: 3.7 mmol/L (ref 3.5–5.1)
Sodium: 136 mmol/L (ref 135–145)

## 2023-09-15 LAB — MAGNESIUM: Magnesium: 2.2 mg/dL (ref 1.7–2.4)

## 2023-09-15 LAB — HEPARIN LEVEL (UNFRACTIONATED): Heparin Unfractionated: 0.62 [IU]/mL (ref 0.30–0.70)

## 2023-09-15 MED ORDER — SODIUM CHLORIDE 0.9 % IV SOLN: INTRAVENOUS | Status: DC

## 2023-09-15 MED ORDER — POTASSIUM CHLORIDE CRYS ER 20 MEQ PO TBCR
20.0000 meq | EXTENDED_RELEASE_TABLET | Freq: Once | ORAL | Status: AC
  Administered 2023-09-15: 20 meq via ORAL
  Filled 2023-09-15: qty 1

## 2023-09-15 MED ORDER — ASPIRIN 81 MG PO CHEW
81.0000 mg | CHEWABLE_TABLET | ORAL | Status: AC
  Administered 2023-09-16: 81 mg via ORAL
  Filled 2023-09-15: qty 1

## 2023-09-15 NOTE — Plan of Care (Signed)

## 2023-09-15 NOTE — Progress Notes (Signed)
Patient complaining of left ankle and foot pain from unna boots, patient has had them on for 3 days and requesting to have them removed this evening.  Unna boots removed patient much more comfortable.

## 2023-09-15 NOTE — H&P (View-Only) (Signed)
Pre cath orders written per MD - Dr. Tenny Craw recommends IVF @ 50cc/hr starting 6am tomorrow. She will update rounding note to reflect her consent.

## 2023-09-15 NOTE — Progress Notes (Signed)
PHARMACY - ANTICOAGULATION CONSULT NOTE  Pharmacy Consult for UFH IV Infusion Indication: chest pain/ACS and atrial fibrillation  No Known Allergies  Patient Measurements: Height: 5\' 8"  (172.7 cm) Weight: 81.2 kg (179 lb 1.6 oz) IBW/kg (Calculated) : 68.4 Heparin Dosing Weight: 81.2 kg  Vital Signs: Temp: 97.6 F (36.4 C) (12/22 0751) Temp Source: Oral (12/22 0751) BP: 92/59 (12/22 0751) Pulse Rate: 60 (12/22 0751)  Labs: Recent Labs    09/13/23 0231 09/13/23 1205 09/14/23 0211 09/14/23 1444 09/15/23 0213  HGB 10.2*  --  9.8*  --  10.6*  HCT 31.5*  --  30.4*  --  32.7*  PLT 173  --  171  --  186  APTT 104*   < > 102* 69* 81*  HEPARINUNFRC >1.10*  --  0.95*  --  0.62  CREATININE 1.82*  --  2.29*  --  1.89*   < > = values in this interval not displayed.    Estimated Creatinine Clearance: 31.2 mL/min (A) (by C-G formula based on SCr of 1.89 mg/dL (H)).   Assessment: 54 YOM PMH of CVA, persistent AF, and systolic CHF. On 11/24 pt was admitted to [facility] with anemia and hypotension with melena. Pt received 2 units of pRBC and remained hypotensive. Transferred to [facility]. Underwent LHC on 12/18 with severe prox LAD stenosis (99%) and suspected heavily calcified RCA (80%). Plans to evaluate for CABG or PCI with atherectomy at this time. Prior to admission, patient was on Eliquis for [facility] for AF.   aPTT is therapeutic at 81 seconds on UFH 1050 units/hour. Hgb and Plt stable, no signs of bleeding.   Heparin level is at higher end of therapeutic level at 0.62. Will likely better correlate tomorrow AM.  Goal of Therapy:  Heparin level 0.3-0.7 units/ml aPTT 66-102 seconds Monitor platelets by anticoagulation protocol: Yes   Plan:  Continue heparin infusion at 950 units/hr  F/u aPTT and heparin level daily until correlate, CBC, and for s/sx of bleeding   Thank you for allowing pharmacy to participate in this patient's care,  Wilmer Floor, PharmD PGY2 Cardiology Pharmacy  Resident 09/15/2023  10:35 AM

## 2023-09-15 NOTE — Progress Notes (Addendum)
Cardiologist: Dina Rich, MD  Chief Complaint: Acute on Chronic Systolic Heart Failure  Subjective:    Pt comfortable   Denies CP  No SOB   Objective:   Weight Range: 81.2 kg Body mass index is 27.23 kg/m.   Vital Signs:   Temp:  [97.6 F (36.4 C)-98.2 F (36.8 C)] 97.6 F (36.4 C) (12/22 0342) Pulse Rate:  [60-63] 63 (12/22 0541) Resp:  [17] 17 (12/21 1609) BP: (101-144)/(56-74) 138/73 (12/22 0541) SpO2:  [93 %-96 %] 95 % (12/21 2324) Weight:  [81.2 kg] 81.2 kg (12/22 0342) Last BM Date : 09/13/23  Weight change: Filed Weights   09/13/23 0417 09/14/23 0540 09/15/23 0342  Weight: 82.3 kg 81.7 kg 81.2 kg    Intake/Output:   Intake/Output Summary (Last 24 hours) at 09/15/2023 0552 Last data filed at 09/15/2023 0345 Gross per 24 hour  Intake 1110.22 ml  Output 2425 ml  Net -1314.78 ml   Net neg 8.1L   (accuracy) Wt down 1 kg  Physical Exam    General: JVP is normal  HEENT: neck supple.   Cardiac:  S1 and S2 present. II/VI systolic murmur LSB to base  Resp: CTA Abdomen: Soft, non-tender, no hepatomegaly   Extremities: Warm and dry. No rash, cyanosis.  Tr LE edema Wrapped  Neuro: Alert and oriented x3. Affect pleasant.   Telemetry   SR   EKG    No new EKG to review  Labs    CBC Recent Labs    09/14/23 0211 09/15/23 0213  WBC 5.9 6.2  HGB 9.8* 10.6*  HCT 30.4* 32.7*  MCV 94.1 94.0  PLT 171 186   Basic Metabolic Panel Recent Labs    16/10/96 0211 09/15/23 0213  NA 135 136  K 3.7 3.7  CL 98 100  CO2 28 25  GLUCOSE 92 93  BUN 24* 22  CREATININE 2.29* 1.89*  CALCIUM 8.3* 8.7*  MG 1.7 2.2   Liver Function Tests No results for input(s): "AST", "ALT", "ALKPHOS", "BILITOT", "PROT", "ALBUMIN" in the last 72 hours.  No results for input(s): "LIPASE", "AMYLASE" in the last 72 hours. Cardiac Enzymes No results for input(s): "CKTOTAL", "CKMB", "CKMBINDEX", "TROPONINI" in the last 72 hours.  BNP: BNP (last 3 results) Recent  Labs    08/19/23 0213 09/03/23 1621 09/11/23 1637  BNP 1,228.1* 1,602.8* 1,478.1*   ProBNP (last 3 results) No results for input(s): "PROBNP" in the last 8760 hours.  D-Dimer No results for input(s): "DDIMER" in the last 72 hours. Hemoglobin A1C No results for input(s): "HGBA1C" in the last 72 hours. Fasting Lipid Panel No results for input(s): "CHOL", "HDL", "LDLCALC", "TRIG", "CHOLHDL", "LDLDIRECT" in the last 72 hours. Thyroid Function Tests No results for input(s): "TSH", "T4TOTAL", "T3FREE", "THYROIDAB" in the last 72 hours.  Invalid input(s): "FREET3"  Other results:  Imaging   No results found.  Medications:    Scheduled Medications:  allopurinol  100 mg Oral BID   amiodarone  200 mg Oral Daily   aspirin  81 mg Oral Daily   atorvastatin  80 mg Oral Daily   carvedilol  12.5 mg Oral BID WC   feeding supplement  237 mL Oral BID BM   ferrous sulfate  325 mg Oral Daily   folic acid  1 mg Oral Daily   Living Better with Heart Failure Book   Does not apply Once   pantoprazole  40 mg Oral BID   polyethylene glycol  17 g Oral Daily  sodium chloride flush  3 mL Intravenous Q12H   sodium chloride flush  3 mL Intravenous Q12H    Infusions:  sodium chloride     heparin 950 Units/hr (09/15/23 0359)    PRN Medications: acetaminophen, docusate sodium, ondansetron (ZOFRAN) IV, sodium chloride flush, sodium chloride flush  Patient Profile   Connor Bryant is a 77 y.o. male with past medical history of CVA, persistent Afib, and chronic systolic heart failure.   Assessment/Plan   1. Acute on chronic systolic CHF:  Drop in EF in 8/21 to 35% with apical akinesis in setting CVA with improvement to normal EF by 06/2020. This was thought to be due to stress cardiomyopathy.    Echo in 07/2023 LVEF 30-35% with diffuse hypokinesis, normal RV, PASP 60 mmHg, mild-moderate MR, moderate AS. This was in the setting of atrial fibrillation and anemia though not felt to be tachy  mediated as HR well controlled at time   12/18/24LHC/RHC showed critical proximal LAD and severe ostial RCA stenoses, suggesting an ischemic cardiomyopathy. Mildly elevated RA pressure, elevated PCWP with prominent v-waves, normal LVEDP. Simultaneous LV/PCWP pressures were not measured, but PCWP 29 and LVEDP 11 suggests gradient across mitral valve with significant mitral stenosis. Moderate pulmonary venous hypertension. Low but not markedly low cardiac output (CI 2.23 Fick, 2.03 thermo).     Current volume status overall pretty good  Pt comfortable flat GDMT, diuretics on hold with plans for intervention on Monday   -  2. CAD:  Cath 12/18 showed severe, heavily calcified proximal LAD stenosis after D1 and S1 (99%) with TIMI 2 flow down the distal LAD. Suspect 80% heavily calcified ostial RCA stenosis. Suspect ischemic cardiomyopathy.   -After review with interventional, surgery plan is for PCI on Monday   - Continue statin + ASA 81 daily.   Risks / benefits described   Pt understands and agrees to proceed with planned PCI tomorrow.    3. Atrial fibrillation: Persistent. He was back on Eliquis after recent GI bleeding from erosive gastritis. He was started on amiodarone at 11/24 admission. Tele difficult to see   Will get EKG  ? Afib vs SR with pacs  - Hold Eliquis for now  On heparin gtt.  - Continue amiodarone 200 mg daily, LFTs okay. - If he has further GI bleeding, would consider Watchman for him.   4. CVA: 8/21, suspect due to atrial fibrillation.    5. PE: In 8/21, he is on Heparin now   WIll need to transition to NOAC  6. Aortic stenosis: AS looked moderate on 12/24 echo.  However, aortic valve was easily crossed at cath 09/11/23 and peak-to-peak gradient was only 8 mmHg.   7. PAD: H/o iliac stents and mesenteric artery bypass.  - Continue statin.   8. Carotid stenosis: Repeat carotid dopplers Mlld RICA plaquing  MIld to moderate LICA     9. GI bleeding: 11/24 GI bleeding from  erosive esophagitis.  Hgb is stable   No bleeding    10. CKD stage 3:  Cr was 2.29 yesterday   Is 1.89 today       Will hold lasix and spironolactone with intervention tomorrow   sCr at baseline ~1.4-1.7.   11. Mitral stenosis: Mild mitral stenosis on 11/24 echo with mean gradient 5 mmHg.  On cath 09/11/23, simultaneous LV/PCWP pressures were not measured, but PCWP 29 and LVEDP 11 suggests gradient across mitral valve with significant mitral stenosis.  - no mitral stenosis noted on 12/24 echo. Mild  MR. 12. Hypokalemia - K 3.7 again this am  Length of Stay: 4  Dietrich Pates, MD  09/15/2023, 5:52 AM

## 2023-09-15 NOTE — Progress Notes (Signed)
Pre cath orders written per MD - Dr. Tenny Craw recommends IVF @ 50cc/hr starting 6am tomorrow. She will update rounding note to reflect her consent.

## 2023-09-16 ENCOUNTER — Encounter (HOSPITAL_COMMUNITY)
Admission: AD | Disposition: A | Discharge status: Skilled Nursing Facility | Payer: Self-pay | Source: Home / Self Care | Attending: Cardiology

## 2023-09-16 ENCOUNTER — Telehealth: Payer: Self-pay | Admitting: *Deleted

## 2023-09-16 ENCOUNTER — Other Ambulatory Visit: Payer: Self-pay | Admitting: *Deleted

## 2023-09-16 ENCOUNTER — Ambulatory Visit (HOSPITAL_COMMUNITY): Admit: 2023-09-16 | Payer: Medicare Other | Admitting: Cardiology

## 2023-09-16 DIAGNOSIS — I2510 Atherosclerotic heart disease of native coronary artery without angina pectoris: Secondary | ICD-10-CM | POA: Diagnosis not present

## 2023-09-16 HISTORY — PX: CORONARY ULTRASOUND/IVUS: CATH118244

## 2023-09-16 HISTORY — PX: CORONARY ATHERECTOMY: CATH118238

## 2023-09-16 HISTORY — PX: CORONARY STENT INTERVENTION: CATH118234

## 2023-09-16 HISTORY — PX: CORONARY LITHOTRIPSY: CATH118330

## 2023-09-16 LAB — BASIC METABOLIC PANEL
Anion gap: 11 (ref 5–15)
BUN: 21 mg/dL (ref 8–23)
CO2: 22 mmol/L (ref 22–32)
Calcium: 8.7 mg/dL — ABNORMAL LOW (ref 8.9–10.3)
Chloride: 101 mmol/L (ref 98–111)
Creatinine, Ser: 1.77 mg/dL — ABNORMAL HIGH (ref 0.61–1.24)
GFR, Estimated: 39 mL/min — ABNORMAL LOW (ref >60)
Glucose, Bld: 104 mg/dL — ABNORMAL HIGH (ref 70–99)
Potassium: 3.7 mmol/L (ref 3.5–5.1)
Sodium: 134 mmol/L — ABNORMAL LOW (ref 135–145)

## 2023-09-16 LAB — MAGNESIUM: Magnesium: 1.9 mg/dL (ref 1.7–2.4)

## 2023-09-16 LAB — POCT ACTIVATED CLOTTING TIME
Activated Clotting Time: 251 s
Activated Clotting Time: 360 s
Activated Clotting Time: 274 s
Activated Clotting Time: 343 s

## 2023-09-16 LAB — CBC
HCT: 32.6 % — ABNORMAL LOW (ref 39.0–52.0)
Hemoglobin: 10.7 g/dL — ABNORMAL LOW (ref 13.0–17.0)
MCH: 31.2 pg (ref 26.0–34.0)
MCHC: 32.8 g/dL (ref 30.0–36.0)
MCV: 95 fL (ref 80.0–100.0)
Platelets: 181 10*3/uL (ref 150–400)
RBC: 3.43 MIL/uL — ABNORMAL LOW (ref 4.22–5.81)
RDW: 16.7 % — ABNORMAL HIGH (ref 11.5–15.5)
WBC: 6.1 10*3/uL (ref 4.0–10.5)
nRBC: 0 % (ref 0.0–0.2)

## 2023-09-16 LAB — APTT: aPTT: 65 s — ABNORMAL HIGH (ref 24–36)

## 2023-09-16 LAB — HEPARIN LEVEL (UNFRACTIONATED): Heparin Unfractionated: 0.41 [IU]/mL (ref 0.30–0.70)

## 2023-09-16 SURGERY — CORONARY STENT INTERVENTION
Anesthesia: LOCAL

## 2023-09-16 MED ORDER — MIDAZOLAM HCL 2 MG/2ML IJ SOLN
INTRAMUSCULAR | Status: DC | PRN
  Administered 2023-09-16: 1 mg via INTRAVENOUS

## 2023-09-16 MED ORDER — NITROGLYCERIN 1 MG/10 ML FOR IR/CATH LAB
INTRA_ARTERIAL | Status: DC | PRN
  Administered 2023-09-16 (×2): 200 ug via INTRACORONARY

## 2023-09-16 MED ORDER — VIPERSLIDE LUBRICANT OPTIME
TOPICAL | Status: DC | PRN
  Administered 2023-09-16: 20 mL via SURGICAL_CAVITY

## 2023-09-16 MED ORDER — MIDAZOLAM HCL 2 MG/2ML IJ SOLN
INTRAMUSCULAR | Status: AC
Start: 2023-09-16 — End: ?
  Filled 2023-09-16: qty 2

## 2023-09-16 MED ORDER — FENTANYL CITRATE (PF) 100 MCG/2ML IJ SOLN
INTRAMUSCULAR | Status: AC
  Filled 2023-09-16: qty 2

## 2023-09-16 MED ORDER — SODIUM CHLORIDE 0.9 % IV SOLN: 250.0000 mL | INTRAVENOUS | Status: AC | PRN

## 2023-09-16 MED ORDER — CLOPIDOGREL BISULFATE 75 MG PO TABS
75.0000 mg | ORAL_TABLET | Freq: Every day | ORAL | Status: DC
  Administered 2023-09-17 – 2023-09-27 (×11): 75 mg via ORAL
  Filled 2023-09-16 (×11): qty 1

## 2023-09-16 MED ORDER — APIXABAN 5 MG PO TABS
5.0000 mg | ORAL_TABLET | Freq: Two times a day (BID) | ORAL | Status: DC
  Administered 2023-09-16 – 2023-09-27 (×22): 5 mg via ORAL
  Filled 2023-09-16 (×22): qty 1

## 2023-09-16 MED ORDER — SODIUM CHLORIDE 0.9% FLUSH: 3.0000 mL | INTRAVENOUS | Status: DC | PRN

## 2023-09-16 MED ORDER — NITROGLYCERIN 1 MG/10 ML FOR IR/CATH LAB
INTRA_ARTERIAL | Status: AC
  Filled 2023-09-16: qty 10

## 2023-09-16 MED ORDER — MAGNESIUM SULFATE 2 GM/50ML IV SOLN
2.0000 g | Freq: Once | INTRAVENOUS | Status: AC
  Administered 2023-09-16: 2 g via INTRAVENOUS
  Filled 2023-09-16: qty 50

## 2023-09-16 MED ORDER — HEPARIN SODIUM (PORCINE) 1000 UNIT/ML IJ SOLN
INTRAMUSCULAR | Status: AC
  Filled 2023-09-16: qty 10

## 2023-09-16 MED ORDER — LIDOCAINE HCL (PF) 1 % IJ SOLN
INTRAMUSCULAR | Status: AC
Start: 2023-09-16 — End: ?
  Filled 2023-09-16: qty 30

## 2023-09-16 MED ORDER — CLOPIDOGREL BISULFATE 75 MG PO TABS
600.0000 mg | ORAL_TABLET | Freq: Once | ORAL | Status: AC
Start: 2023-09-16 — End: 2023-09-16
  Administered 2023-09-16: 600 mg via ORAL
  Filled 2023-09-16: qty 8

## 2023-09-16 MED ORDER — IOHEXOL 350 MG/ML SOLN
INTRAVENOUS | Status: DC | PRN
  Administered 2023-09-16: 75 mL

## 2023-09-16 MED ORDER — SODIUM CHLORIDE 0.9 % IV SOLN
INTRAVENOUS | Status: AC | PRN
  Administered 2023-09-16: 10 mL/h via INTRAVENOUS

## 2023-09-16 MED ORDER — HEPARIN (PORCINE) IN NACL 1000-0.9 UT/500ML-% IV SOLN
INTRAVENOUS | Status: DC | PRN
  Administered 2023-09-16 (×2): 500 mL

## 2023-09-16 MED ORDER — VERAPAMIL HCL 2.5 MG/ML IV SOLN
INTRAVENOUS | Status: AC
  Filled 2023-09-16: qty 2

## 2023-09-16 MED ORDER — LIDOCAINE HCL (PF) 1 % IJ SOLN
INTRAMUSCULAR | Status: DC | PRN
  Administered 2023-09-16: 2 mL

## 2023-09-16 MED ORDER — HEPARIN SODIUM (PORCINE) 1000 UNIT/ML IJ SOLN
INTRAMUSCULAR | Status: DC | PRN
  Administered 2023-09-16: 2000 [IU] via INTRAVENOUS
  Administered 2023-09-16: 8000 [IU] via INTRAVENOUS
  Administered 2023-09-16: 3000 [IU] via INTRAVENOUS

## 2023-09-16 MED ORDER — POTASSIUM CHLORIDE CRYS ER 20 MEQ PO TBCR
20.0000 meq | EXTENDED_RELEASE_TABLET | Freq: Once | ORAL | Status: AC
  Administered 2023-09-16: 20 meq via ORAL
  Filled 2023-09-16: qty 1

## 2023-09-16 MED ORDER — VERAPAMIL HCL 2.5 MG/ML IV SOLN
INTRAVENOUS | Status: DC | PRN
  Administered 2023-09-16: 10 mL via INTRA_ARTERIAL

## 2023-09-16 MED ORDER — FENTANYL CITRATE (PF) 100 MCG/2ML IJ SOLN
INTRAMUSCULAR | Status: DC | PRN
  Administered 2023-09-16: 25 ug via INTRAVENOUS

## 2023-09-16 MED ORDER — SODIUM CHLORIDE 0.9 % WEIGHT BASED INFUSION: 1.0000 mL/kg/h | INTRAVENOUS | Status: AC

## 2023-09-16 MED ORDER — HEPARIN SODIUM (PORCINE) 1000 UNIT/ML IJ SOLN
INTRAMUSCULAR | Status: AC
Start: 2023-09-16 — End: ?
  Filled 2023-09-16: qty 10

## 2023-09-16 MED ORDER — SODIUM CHLORIDE 0.9% FLUSH
3.0000 mL | Freq: Two times a day (BID) | INTRAVENOUS | Status: DC
  Administered 2023-09-16 – 2023-09-27 (×13): 3 mL via INTRAVENOUS

## 2023-09-16 MED ORDER — HYDRALAZINE HCL 20 MG/ML IJ SOLN: 10.0000 mg | INTRAMUSCULAR | Status: AC | PRN

## 2023-09-16 SURGICAL SUPPLY — 34 items
BALL SAPPHIRE NC24 2.75X12 (BALLOONS) ×1 IMPLANT
BALL SAPPHIRE NC24 3.0X12 (BALLOONS) ×1 IMPLANT
BALL SAPPHIRE NC24 3.5X12 (BALLOONS) ×1 IMPLANT
BALLN SCOREFLEX 2.50X10 (BALLOONS) ×1 IMPLANT
BALLN SPRINTER MX OTW 2.0X12 (BALLOONS) ×1 IMPLANT
BALLOON SAPPHIRE NC24 2.75X12 (BALLOONS) IMPLANT
BALLOON SAPPHIRE NC24 3.0X12 (BALLOONS) IMPLANT
BALLOON SAPPHIRE NC24 3.5X12 (BALLOONS) IMPLANT
BALLOON SCOREFLEX 2.50X10 (BALLOONS) IMPLANT
BALLOON SPRINTER MX OTW 2.0X12 (BALLOONS) IMPLANT
CATH LAUNCHER 6FR AL.75 (CATHETERS) IMPLANT
CATH OPTICROSS HD (CATHETERS) IMPLANT
CATH SHOCKWAVE C2 3.0X12 (CATHETERS) IMPLANT
CATH VISTA GUIDE 6FR XBLAD3.5 (CATHETERS) IMPLANT
CROWN DIAMONDBACK CLASSIC 1.25 (BURR) IMPLANT
DEVICE RAD COMP TR BAND LRG (VASCULAR PRODUCTS) IMPLANT
ELECT DEFIB PAD ADLT CADENCE (PAD) IMPLANT
GLIDESHEATH SLEND SS 6F .021 (SHEATH) IMPLANT
GUIDEWIRE INQWIRE 1.5J.035X260 (WIRE) IMPLANT
INQWIRE 1.5J .035X260CM (WIRE) ×1 IMPLANT
KIT ENCORE 26 ADVANTAGE (KITS) IMPLANT
LUBRICANT VIPERSLIDE CORONARY (MISCELLANEOUS) IMPLANT
PACK CARDIAC CATHETERIZATION (CUSTOM PROCEDURE TRAY) ×1 IMPLANT
SET ATX-X65L (MISCELLANEOUS) IMPLANT
SHEATH PROBE COVER 6X72 (BAG) IMPLANT
SLED PULL BACK IVUS (MISCELLANEOUS) IMPLANT
STENT SYNERGY XD 2.50X24 (Permanent Stent) IMPLANT
STENT SYNERGY XD 3.0X20 (Permanent Stent) IMPLANT
SYNERGY XD 2.50X24 (Permanent Stent) ×1 IMPLANT
SYNERGY XD 3.0X20 (Permanent Stent) ×1 IMPLANT
TUBING CIL FLEX 10 FLL-RA (TUBING) IMPLANT
WIRE ASAHI PROWATER 180CM (WIRE) IMPLANT
WIRE HI TORQ VERSACORE-J 145CM (WIRE) IMPLANT
WIRE VIPERWIRE COR FLEX .012 (WIRE) IMPLANT

## 2023-09-16 NOTE — Plan of Care (Signed)
  Problem: Education: Goal: Understanding of CV disease, CV risk reduction, and recovery process will improve Outcome: Progressing Goal: Individualized Educational Video(s) Outcome: Progressing   Problem: Activity: Goal: Ability to return to baseline activity level will improve Outcome: Progressing   Problem: Cardiovascular: Goal: Ability to achieve and maintain adequate cardiovascular perfusion will improve Outcome: Progressing Goal: Vascular access site(s) Level 0-1 will be maintained Outcome: Progressing   Problem: Health Behavior/Discharge Planning: Goal: Ability to safely manage health-related needs after discharge will improve Outcome: Progressing   Problem: Education: Goal: Knowledge of General Education information will improve Description: Including pain rating scale, medication(s)/side effects and non-pharmacologic comfort measures Outcome: Progressing   Problem: Health Behavior/Discharge Planning: Goal: Ability to manage health-related needs will improve Outcome: Progressing   Problem: Clinical Measurements: Goal: Ability to maintain clinical measurements within normal limits will improve Outcome: Progressing Goal: Will remain free from infection Outcome: Progressing Goal: Diagnostic test results will improve Outcome: Progressing Goal: Respiratory complications will improve Outcome: Progressing Goal: Cardiovascular complication will be avoided Outcome: Progressing   Problem: Activity: Goal: Risk for activity intolerance will decrease Outcome: Progressing   Problem: Nutrition: Goal: Adequate nutrition will be maintained Outcome: Progressing   Problem: Coping: Goal: Level of anxiety will decrease Outcome: Progressing   Problem: Elimination: Goal: Will not experience complications related to bowel motility Outcome: Progressing Goal: Will not experience complications related to urinary retention Outcome: Progressing   Problem: Pain Management: Goal:  General experience of comfort will improve Outcome: Progressing   Problem: Safety: Goal: Ability to remain free from injury will improve Outcome: Progressing   Problem: Skin Integrity: Goal: Risk for impaired skin integrity will decrease Outcome: Progressing   Problem: Education: Goal: Ability to demonstrate management of disease process will improve Outcome: Progressing Goal: Ability to verbalize understanding of medication therapies will improve Outcome: Progressing Goal: Individualized Educational Video(s) Outcome: Progressing   Problem: Activity: Goal: Capacity to carry out activities will improve Outcome: Progressing   Problem: Cardiac: Goal: Ability to achieve and maintain adequate cardiopulmonary perfusion will improve Outcome: Progressing

## 2023-09-16 NOTE — Patient Outreach (Signed)
Care Management  Transitions of Care Program Transitions of Care Post-discharge week 4   09/16/2023 Name: Connor [facility] MRN: [id number] DOB: 25-Jun-1946  Subjective: ISAMU GUNDY is a 77 y.o. year old male who is a primary care patient of Stacks, Broadus John, MD. The Care Management team Engaged with patient Engaged with patient by telephone to assess and address transitions of care needs.   Consent to Services:  Pt previously agreed to program  Assessment: Spoke with patient who is at the hospital after having cardiac cath earlier today, pt states "went well" and "hoping to go home soon".          SDOH Interventions    Flowsheet Row Telephone from 08/26/2023 in [place] POPULATION HEALTH DEPARTMENT ED to Hosp-Admission (Discharged) from 08/17/2023 in [place] [facility] CV PROGRESSIVE CARE Office Visit from 06/25/2023 in [facility] Clinical Support from 12/03/2022 in [facility] Clinical Support from 11/27/2021 in [facility] Office Visit from 03/02/2021 in [facility]  SDOH Interventions        Food Insecurity Interventions Intervention Not Indicated -- -- Intervention Not Indicated Intervention Not Indicated --  Housing Interventions Intervention Not Indicated -- -- Intervention Not Indicated Intervention Not Indicated --  Transportation Interventions Intervention Not Indicated -- -- Intervention Not Indicated Intervention Not Indicated --  Utilities Interventions Intervention Not Indicated -- -- Intervention Not Indicated -- --  Alcohol Usage Interventions -- Intervention Not Indicated (Score <7) -- Intervention Not Indicated (Score <7) -- --  Depression Interventions/Treatment  -- -- FAO1-3 Score <4 Follow-up Not Indicated -- -- PHQ2-9 Score <4 Follow-up Not Indicated  Financial Strain Interventions -- Intervention Not Indicated -- Intervention Not  Indicated Intervention Not Indicated --  Physical Activity Interventions -- -- -- Intervention Not Indicated Intervention Not Indicated --  Stress Interventions -- -- -- Intervention Not Indicated Intervention Not Indicated --  Social Connections Interventions -- -- -- Intervention Not Indicated Intervention Not Indicated --        Goals Addressed             This Visit's Progress    Transitions of Care/ pt will have no readmission within 30 days       Current Barriers:  Chronic Disease Management support and education needs related to CHF and GI bleed  Patient reports doing well since discharge, has "no GI bleeding and never did have any"  pt states " they are not sure where bleeding is coming from"  pt states he lives alone, has son that checks in on him regularly and assists with transportation, 12/17- no GI bleeding reported Patient reports he has a scale and will start weighing again when he is more stable on his feet, home health weighed pt at 189 pounds, has walker he uses regularly Patient reports physical therapist came to his home, completed evaluation and will be working with him, hopefully this will help balance so pt can stand on the scales and work on endurance, activity tolerance as pt continues to have dyspnea with exertion. Patient reports he has no LE edema when he first wakes up but does have some edema as the day goes on and he is up on his feet. Patient saw primary care provider on 12/3, saw cardiology on 12/10 and has cardiac cath scheduled for 12/18, follow up with cardiologist on 12/31 @ 9 am, will follow up with rheumatologist 10/07/23 as a new patient  09/16/23- spoke with patient who reports he had "procedure this morning, cardiac cath" and still at the hospital recuperating, requests call back next week  RNCM Clinical Goal(s):  Patient will work with the Care Management team over the next 30 days to address Transition of Care Barriers: Home Health  services verbalize understanding of plan for management of CHF and GI bleed as evidenced by patient report, review of EMR take all medications exactly as prescribed and will call provider for medication related questions as evidenced by patient report, refill history and review of EMR attend all scheduled medical appointments: primary care provider, GI and cardiology as evidenced by patient report, review of EMR  through collaboration with RN Care manager, provider, and care team.   Interventions: Evaluation of current treatment plan related to  self management and patient's adherence to plan as established by provider   Heart Failure Interventions:  (Status:  New goal. and Goal on track:  Yes.) Short Term Goal Basic overview and discussion of pathophysiology of Heart Failure reviewed Reviewed HF action plan, importance of calling provider early on for change in health status/ symptoms Reviewed medications Reviewed all upcoming scheduled appointments  Patient had coronary stent intervention this morning and still at hospital, will outreach pt next week  GI Bleed: Reviewed signs/ symptoms GI bleeding Reviewed importance of reporting any s/s GI bleeding to provider Reviewed low sodium diet Reviewed importance of continuing to work with home health  Patient Goals/Self-Care Activities: Participate in Transition of Care Program/Attend [facility] scheduled calls Notify RN Care Manager of TOC call rescheduling needs Take all medications as prescribed Attend all scheduled provider appointments Call pharmacy for medication refills 3-7 days in advance of running out of medications Call provider office for new concerns or questions  call office if I gain more than 2 pounds in one day or 5 pounds in one week keep legs up while sitting use salt in moderation watch for swelling in feet, ankles and legs every day weigh myself daily develop a rescue plan follow rescue plan if symptoms flare-up eat more  whole grains, fruits and vegetables, lean meats and healthy fats dress right for the weather, hot or cold Report any signs/ symptoms of GI bleeding to your doctor Continue working with home health PT, complete exercises they give you to do RN care manager will contact you next week  Follow Up Plan:  Telephone follow up appointment with care management team member scheduled for:  09/26/23 @ 11am          Plan: Telephone follow up appointment with care management team member scheduled for:  09/26/23 @ 11 am  Irving Shows [facility], BSN RN Care Manager/ Transition of Care [facility] [place] [phone]

## 2023-09-16 NOTE — Patient Outreach (Signed)
  Care Management  Transitions of Care Program Transitions of Care Post-discharge week 4  09/16/2023 Name: Connor Bryant MRN: [id number] DOB: 26-Dec-1945  Subjective: Connor Bryant is a 77 y.o. year old male who is a primary care patient of Stacks, Broadus John, MD. The Care Management team was unable to reach the patient by phone to assess and address transitions of care needs.   Plan: Additional outreach attempts will be made to reach the patient enrolled in the [facility] Program (Post Inpatient/ED Visit).  Irving Shows [facility], BSN RN Care Manager/ Transition of Care [facility]/ [facility] [phone]

## 2023-09-16 NOTE — Interval H&P Note (Signed)
History and Physical Interval Note:  09/16/2023 8:20 AM  Connor Bryant  has presented today for surgery, with the diagnosis of ischemic cardiomyopathy.  The various methods of treatment have been discussed with the patient and family. After consideration of risks, benefits and other options for treatment, the patient has consented to  Procedure(s): CORONARY STENT INTERVENTION (N/A) as a surgical intervention.  The patient's history has been reviewed, patient examined, no change in status, stable for surgery.  I have reviewed the patient's chart and labs.  Questions were answered to the patient's satisfaction.   Cath Lab Visit (complete for each Cath Lab visit)  Clinical Evaluation Leading to the Procedure:   ACS: Yes.    Non-ACS:    Anginal Classification: CCS III  Anti-ischemic medical therapy: Minimal Therapy (1 class of medications)  Non-Invasive Test Results: No non-invasive testing performed  Prior CABG: No previous CABG        Connor [facility] 09/16/2023 8:20 AM

## 2023-09-16 NOTE — Discharge Summary (Addendum)
 Advanced Heart Failure Team  Discharge Summary   Patient ID: Connor Bryant MRN: [id number], DOB/AGE: [birth year] 78 y.o. Admit date: 09/11/2023 D/C date:     09/27/2023   Primary Discharge Diagnoses:  Acute on Chronic Systolic CHF CAD s/p PCI to RCA and LAD  Paroxysmal Afib  Secondary Discharge Diagnoses:  H/o CVA H/o PE PAD Carotid Stenosis CKD stage 3 H/O GI bleed Mitral Stenosis Hypokalemia  Bryant Course:   Connor Bryant is a 77 y.o. male with past medical history of CVA, persistent Afib (on Eliquis ), and chronic systolic heart failure.    He was admitted to [facility] from the cath lab on 09/11/23 after [facility] which showed mildly elevated RA pressures and elevated PCWP, with concern for mitral valve stenosis, as well as, severe, heavily calcified proximal LAD stenosis after D1 and S1 (99%) with TIMI 2 flow down the distal LAD and 80% lesion in RCA. He was admitted to inpatient for CABG evaluation versus PCI with arthrectomy to LAD +/- RCA. His Eliquis  was held and he was started on heparin  gtt for his persistent rate controlled afib for possible intervention. He was started on gentle diuresis with Lasix  IV and eventually transitioned to home dose PO lasix . Echo performed for evaluation of possible mitral stenosis 09/12/23 showed EF 30-35%, mod reduced RV function, mild MR and mod AS. CABG evaluation was completed including carotid studies showing carotid stenosis L>R. It was decided by TCTS and Structural Heart that PCI would be the best course of action and he was scheduled for cath. Of note, GDMT and diuresis held 09/14/23 for bump in renal function sCr 2.29 with improvement to sCr 1.89. He underwent successful PCI and DES x2 to midLAD and ostial RCA on 09/16/23 with Dr. Kentrel Bryant. He was resumed on his home GDMT and Eliquis . He will be on mono-antiplatelet therapy w/ Plavix . No ASA given h/o GIB. Hypotensive with addition of Entresto  so stopped.  Due to weakness/deconditioning, was recommended  for CIR, however insurance denied. He was approved for and has a bed at [facility].   Today is he stable for discharge with Advanced Heart Failure Clinic follow up.  Bryant Course by Problem List:   1. Acute on chronic systolic CHF: Drop in EF in 8/21 to 35% with apical akinesis in setting CVA with improvement to normal EF by 10/21. This was thought to be due to stress cardiomyopathy.  Echo in 11/24 showed EF back down to EF 30-35% with diffuse hypokinesis, normal RV, PASP 60 mmHg, mild-moderate MR, moderate AS. This was in the setting of atrial fibrillation and anemia. I did not think that atrial fibrillation was the cause of this as rate was well-controlled (not tachycardia-mediated). LHC/RHC 12/18 showed critical proximal LAD and severe ostial RCA stenoses, suggesting an ischemic cardiomyopathy. Mildly elevated RA pressure, elevated PCWP with prominent v-waves, normal LVEDP. Simultaneous LV/PCWP pressures were not measured, but PCWP 29 and LVEDP 11 suggests gradient across mitral valve with significant mitral stenosis. Moderate pulmonary venous hypertension. Low but not markedly low cardiac output (CI 2.23 Fick, 2.03 thermo).  Now s/p PCA w DES x2 to LAD and RCA. - Echo 09/12/23: EF 30-35%, mod reduced RV function. Mild MR and Mod AS.  - Euvolemic on exam  - Continue Farxiga  10 mg  - Continue Spiro 12.5 mg daily. - Hypotensive with Entresto . Retrial with Valsartan  as outpatient. - Decrease Coreg  to 6.25 mg bid - Repeat echo in 3 months, ?ICD.  2. CAD: Patient has not had any chest  pain. He has had exertional dyspnea. Echo in 11/24 showed EF down to 30-35%. Cath 12/18 showed severe, heavily calcified proximal LAD stenosis after D1 and S1 (99%) with TIMI 2 flow down the distal LAD. Suspect 80% heavily calcified ostial RCA stenosis. Suspect ischemic cardiomyopathy.  - s/p PCI w DES x2 to LAD and RCA. Will need monotherapy antiplatelet with Plavix  (on Eliquis  for Afib) - Stable w/o CP  - Continue  Plavix  - Continue statin daily 3. Atrial fibrillation: Persistent. He is back on Eliquis  after recent GI bleeding from erosive gastritis. He was started on amiodarone  at 11/24 admission. NSR on tele.  - on Eliquis . Hgb stable. - Continue amiodarone  200 mg daily - If he has further GI bleeding, would consider Watchman for him.  - Consider AF ablation in future.  4. CVA: 8/21, suspect due to atrial fibrillation.   5. PE: In 8/21, he is on Eliquis .  6. Aortic stenosis: AS looked moderate on 11/24 echo. S2 can be heard clearly. However, aortic valve was easily crossed on cath 09/11/23 and peak-to-peak gradient was only 8 mmHg.  - Mod stenosis present on Echo 09/12/23 7. PAD: H/o iliac stents and mesenteric artery bypass. No claudication or intestinal angina. No pedal ulcerations.  - Continue statin.  8. Carotid stenosis: 40-59% L ICA and 1-39% R ICA stenosis on 09/12/23. 9. GI bleeding: 11/24 GI bleeding from erosive esophagitis. CBC has been stable with no overt bleeding.  - Avoid ETOH.  10. CKD stage 3b: sCr baseline ~1.4-2.0 (difficult to assess, fluctuant).  - sCr bumped with hypotension 1.97>2.22 11. Mitral stenosis: Mild mitral stenosis on 11/24 echo with mean gradient 5 mmHg.  On cath 09/11/23, simultaneous LV/PCWP pressures were not measured, but PCWP 29 and LVEDP 11 suggests gradient across mitral valve with significant mitral stenosis.  - no mitral stenosis noted on 12/24 echo. Mild MR. 12. Deconditioning - Has bed at [facility]  Discharge Weight Range: 170.6 lbs Discharge Vitals: Blood pressure (!) 106/56, pulse 65, temperature 97.9 F (36.6 C), temperature source Oral, resp. rate 17, height 5' 8 (1.727 m), weight 77.8 kg, SpO2 100%.  Labs: Lab Results  Component Value Date   WBC 8.3 09/27/2023   HGB 11.4 (L) 09/27/2023   HCT 35.1 (L) 09/27/2023   MCV 93.4 09/27/2023   PLT 265 09/27/2023    Recent Labs  Lab 09/27/23 0221  NA 135  K 4.1  CL 105  CO2 21*  BUN 38*   CREATININE 2.11*  CALCIUM  9.1  GLUCOSE 149*   Lab Results  Component Value Date   CHOL 147 05/23/2023   HDL 81 05/23/2023   LDLCALC 54 05/23/2023   TRIG 60 05/23/2023   BNP (last 3 results) Recent Labs    08/19/23 0213 09/03/23 1621 09/11/23 1637  BNP 1,228.1* 1,602.8* 1,478.1*   ProBNP (last 3 results) No results for input(s): PROBNP in the last 8760 hours.  Diagnostic Studies/Procedures   No results found.  Discharge Medications   Allergies as of 09/27/2023   No Known Allergies      Medication List     STOP taking these medications    docusate sodium  100 MG capsule Commonly known as: COLACE   folic acid  1 MG tablet Commonly known as: FOLVITE    polyethylene glycol 17 g packet Commonly known as: MIRALAX  / GLYCOLAX    predniSONE  10 MG tablet Commonly known as: DELTASONE    pyridOXINE  50 MG tablet Commonly known as: B-6       TAKE these  medications    acetaminophen  325 MG tablet Commonly known as: TYLENOL  Take 2 tablets (650 mg total) by mouth every 4 (four) hours as needed for headache or mild pain (pain score 1-3).   allopurinol  100 MG tablet Commonly known as: ZYLOPRIM  Take 1 tablet (100 mg total) by mouth 2 (two) times daily. What changed: See the new instructions.   amiodarone  200 MG tablet Commonly known as: PACERONE  Take 1 tablet (200 mg total) by mouth daily. Start taking on: September 28, 2023 What changed: See the new instructions.   apixaban  5 MG Tabs tablet Commonly known as: Eliquis  Take 1 tablet (5 mg total) by mouth 2 (two) times daily.   atorvastatin  80 MG tablet Commonly known as: LIPITOR  Take 1 tablet (80 mg total) by mouth daily.   Blood Glucose Monitoring Suppl Devi 1 each by Does not apply route in the morning, at noon, and at bedtime. May substitute to any manufacturer covered by patient's insurance.   CALCIUM  500 + D PO Take 1 tablet by mouth daily.   carvedilol  6.25 MG tablet Commonly known as: COREG  Take 1  tablet (6.25 mg total) by mouth 2 (two) times daily with a meal. What changed:  medication strength how much to take   clopidogrel  75 MG tablet Commonly known as: PLAVIX  Take 1 tablet (75 mg total) by mouth daily with breakfast. Start taking on: September 28, 2023   dapagliflozin  propanediol 5 MG Tabs tablet Commonly known as: Farxiga  Take 2 tablets (10 mg total) by mouth daily. What changed:  medication strength when to take this   ferrous sulfate  325 (65 FE) MG tablet Take 1 tablet (325 mg total) by mouth daily. Start taking on: September 28, 2023 What changed:  medication strength how much to take   furosemide  20 MG tablet Commonly known as: Lasix  Take 1 tablet (20 mg total) by mouth daily as needed (for weight gain and swelling). What changed:  when to take this reasons to take this   glimepiride  2 MG tablet Commonly known as: AMARYL  TAKE ONE TABLET ONCE DAILY EVERY MORNING What changed:  how much to take how to take this when to take this additional instructions   pantoprazole  40 MG tablet Commonly known as: PROTONIX  Take 1 tablet (40 mg total) by mouth 2 (two) times daily.   spironolactone  25 MG tablet Commonly known as: ALDACTONE  Take 0.5 tablets (12.5 mg total) by mouth daily.        Disposition   The patient will be discharged in stable condition to home. Discharge Instructions     (HEART FAILURE PATIENTS) Call MD:  Anytime you have any of the following symptoms: 1) 3 pound weight gain in 24 hours or 5 pounds in 1 week 2) shortness of breath, with or without a dry hacking cough 3) swelling in the hands, feet or stomach 4) if you have to sleep on extra pillows at night in order to breathe.   Complete by: As directed    Amb Referral to Cardiac Rehabilitation   Complete by: As directed    Diagnosis: Coronary Stents   After initial evaluation and assessments completed: Virtual Based Care may be provided alone or in conjunction with Phase 2 Cardiac Rehab  based on patient barriers.: Yes   Intensive Cardiac Rehabilitation (ICR) [facility] location only OR Traditional Cardiac Rehabilitation (TCR) *If criteria for ICR are not met will enroll in TCR [facility] only): Yes   Diet - low sodium heart healthy   Complete by: As  directed    Heart Failure patients record your daily weight using the same scale at the same time of day   Complete by: As directed    Increase activity slowly   Complete by: As directed        Follow-up Information     [facility] Follow up.   Specialty: Home Health Services Why: Home Health agency will call to arrange visits Contact information: [address] [place] [address] [phone]         [facility] Follow up on 10/14/2023.   Specialty: Cardiology Why: At 2 pm with Dr. Rolan [facility], Entrance C Contact information: [address] [place]  [address] [phone]                  Duration of Discharge Encounter: Greater than 35 minutes   Signed, Georgianna Band, NP  09/27/2023, 1:53 PM

## 2023-09-16 NOTE — Progress Notes (Addendum)
Advanced Heart Failure Rounding Note  Cardiologist: Dina Rich, MD  Chief Complaint: Acute on Chronic Systolic Heart Failure  Subjective:    Cath w PCI today with Interventional Cardiology  Sitting up in bed. Just had labs drawn. Feeling fine this morning, although just tired of being in the hospital. No SOB, CP or dizziness.  Objective:   Weight Range: 80.4 kg Body mass index is 26.95 kg/m.   Vital Signs:   Temp:  [97.6 F (36.4 C)-97.9 F (36.6 C)] 97.7 F (36.5 C) (12/23 0747) Pulse Rate:  [60-64] 64 (12/23 0747) Resp:  [15-20] 19 (12/23 0747) BP: (92-138)/(59-83) 138/83 (12/23 0747) SpO2:  [94 %-99 %] 95 % (12/23 0747) Weight:  [80.4 kg] 80.4 kg (12/23 0347) Last BM Date : 09/15/23  Weight change: Filed Weights   09/14/23 0540 09/15/23 0342 09/16/23 0347  Weight: 81.7 kg 81.2 kg 80.4 kg    Intake/Output:   Intake/Output Summary (Last 24 hours) at 09/16/2023 0749 Last data filed at 09/16/2023 0603 Gross per 24 hour  Intake 607.81 ml  Output 1700 ml  Net -1092.19 ml   Physical Exam    General: Anxious appearing. No distress on RA HEENT: neck supple.   Cardiac: JVP not visible. S1 and S2 present. No murmurs or rub. Resp: Lung sounds clear and equal B/L Abdomen: Soft, non-tender, non-distended. + BS. Extremities: Warm and dry. No rash, cyanosis.  No edema.  Neuro: Alert and oriented x3. Flat affect . Moves all extremities without difficulty.  Telemetry   SR in 50-60s (personally reviewed)  EKG    No new EKG to review  Labs    CBC Recent Labs    09/15/23 0213 09/16/23 0211  WBC 6.2 6.1  HGB 10.6* 10.7*  HCT 32.7* 32.6*  MCV 94.0 95.0  PLT 186 181   Basic Metabolic Panel Recent Labs    32/95/18 0213 09/16/23 0211  NA 136 134*  K 3.7 3.7  CL 100 101  CO2 25 22  GLUCOSE 93 104*  BUN 22 21  CREATININE 1.89* 1.77*  CALCIUM 8.7* 8.7*  MG 2.2 1.9   Liver Function Tests No results for input(s): "AST", "ALT", "ALKPHOS",  "BILITOT", "PROT", "ALBUMIN" in the last 72 hours.  No results for input(s): "LIPASE", "AMYLASE" in the last 72 hours. Cardiac Enzymes No results for input(s): "CKTOTAL", "CKMB", "CKMBINDEX", "TROPONINI" in the last 72 hours.  BNP: BNP (last 3 results) Recent Labs    08/19/23 0213 09/03/23 1621 09/11/23 1637  BNP 1,228.1* 1,602.8* 1,478.1*   ProBNP (last 3 results) No results for input(s): "PROBNP" in the last 8760 hours.  D-Dimer No results for input(s): "DDIMER" in the last 72 hours. Hemoglobin A1C No results for input(s): "HGBA1C" in the last 72 hours. Fasting Lipid Panel No results for input(s): "CHOL", "HDL", "LDLCALC", "TRIG", "CHOLHDL", "LDLDIRECT" in the last 72 hours. Thyroid Function Tests No results for input(s): "TSH", "T4TOTAL", "T3FREE", "THYROIDAB" in the last 72 hours.  Invalid input(s): "FREET3"  Other results:  Imaging   No results found.  Medications:    Scheduled Medications:  allopurinol  100 mg Oral BID   amiodarone  200 mg Oral Daily   aspirin  81 mg Oral Daily   atorvastatin  80 mg Oral Daily   carvedilol  12.5 mg Oral BID WC   feeding supplement  237 mL Oral BID BM   ferrous sulfate  325 mg Oral Daily   folic acid  1 mg Oral Daily   Living Better  with Heart Failure Book   Does not apply Once   pantoprazole  40 mg Oral BID   polyethylene glycol  17 g Oral Daily   sodium chloride flush  3 mL Intravenous Q12H   sodium chloride flush  3 mL Intravenous Q12H    Infusions:  heparin 950 Units/hr (09/15/23 0359)    PRN Medications: acetaminophen, docusate sodium, ondansetron (ZOFRAN) IV, sodium chloride flush, sodium chloride flush  Patient Profile   Connor Bryant is a 77 y.o. male with past medical history of CVA, persistent Afib, and chronic systolic heart failure.   Assessment/Plan   1. Acute on chronic systolic CHF: Drop in EF in 8/21 to 35% with apical akinesis in setting CVA with improvement to normal EF by 10/21. This was thought  to be due to stress cardiomyopathy.  Echo in 11/24 showed EF back down to EF 30-35% with diffuse hypokinesis, normal RV, PASP 60 mmHg, mild-moderate MR, moderate AS. This was in the setting of atrial fibrillation and anemia. I did not think that atrial fibrillation was the cause of this as rate was well-controlled (not tachycardia-mediated). LHC/RHC 12/18 showed critical proximal LAD and severe ostial RCA stenoses, suggesting an ischemic cardiomyopathy. Mildly elevated RA pressure, elevated PCWP with prominent v-waves, normal LVEDP. Simultaneous LV/PCWP pressures were not measured, but PCWP 29 and LVEDP 11 suggests gradient across mitral valve with significant mitral stenosis. Moderate pulmonary venous hypertension. Low but not markedly low cardiac output (CI 2.23 Fick, 2.03 thermo).   - Echo 09/12/23: EF 30-35%, mod reduced RV function. Mild MR and Mod AS.  - GDMT held for bump in renal function, now back to baseline. Resume post-cath if remains stable.  - High risk PCI to LAD +/- RCA today with Dr. [place] 2. CAD: Patient has not had any chest pain. He has had exertional dyspnea. Echo in 11/24 showed EF down to 30-35%. Cath 12/18 showed severe, heavily calcified proximal LAD stenosis after D1 and S1 (99%) with TIMI 2 flow down the distal LAD. Suspect 80% heavily calcified ostial RCA stenosis. Suspect ischemic cardiomyopathy.  - High risk PCI to LAD and RCA with atherectomy today. Loaded with Plavix. - Continue statin + ASA 81 daily 3. Atrial fibrillation: Persistent. He is back on Eliquis after recent GI bleeding from erosive gastritis. He was started on amiodarone at 11/24 admission. I would like to get him back into NSR eventually.  - Hold Eliquis. On heparin gtt.  - Continue amiodarone 200 mg daily - Possibly will need eventual DCCV after stable from PCI.  - If he has further GI bleeding, would consider Watchman for him.  4. CVA: 8/21, suspect due to atrial fibrillation.   5. PE: In 8/21, he is on  Eliquis.  6. Aortic stenosis: AS looked moderate on 11/24 echo. S2 can be heard clearly. However, aortic valve was easily crossed on cath 09/11/23 and peak-to-peak gradient was only 8 mmHg.  - Mod stenosis present on Echo 09/12/23 7. PAD: H/o iliac stents and mesenteric artery bypass. No claudication or intestinal angina. No pedal ulcerations.  - Continue statin.  8. Carotid stenosis: 40-59% L ICA and 1-39% R ICA stenosis on 09/12/23. 9. GI bleeding: 11/24 GI bleeding from erosive esophagitis. CBC has been stable with no overt bleeding.  - Avoid ETOH.  10. CKD stage 3: sCr at baseline ~1.4-1.7. Follow BMET closely.   - bump over the weekend to 2.29. GDMT and diuresis held. Now 1.77 - Follow closely and reinitiate GDMT as able post procedure  11. Mitral stenosis: Mild mitral stenosis on 11/24 echo with mean gradient 5 mmHg.  On cath 09/11/23, simultaneous LV/PCWP pressures were not measured, but PCWP 29 and LVEDP 11 suggests gradient across mitral valve with significant mitral stenosis.  - no mitral stenosis noted on 12/24 echo. Mild MR. 12. Hypokalemia - K 3.7 this am  Length of Stay: 5  [place] Lee, NP  09/16/2023, 7:49 AM  Advanced Heart Failure Team Pager [phone] (M-F; 7a - 5p)  Please contact [facility] for night-coverage after hours (5p -7a ) and weekends on [email or web address]   Patient seen and examined with the above-signed Advanced Practice Provider and/or Housestaff. I personally reviewed laboratory data, imaging studies and relevant notes. I independently examined the patient and formulated the important aspects of the plan. I have edited the note to reflect any of my changes or salient points. I have personally discussed the plan with the patient and/or family.  He is s/p 2v PCI this am. Tolerated well. Hemodynamically improved post-procedure  Denies CP or SOB   General:  Elderly  No resp difficulty HEENT: normal Neck: supple. no JVD. Carotids 2+ bilat; no bruits. No  lymphadenopathy or thryomegaly appreciated. Cor: Irregular rate & rhythm. 2/6 AS Lungs: clear Abdomen: soft, nontender, nondistended. No hepatosplenomegaly. No bruits or masses. Good bowel sounds. Extremities: no cyanosis, clubbing, rash, edema Neuro: alert & orientedx3, cranial nerves grossly intact. moves all 4 extremities w/o difficulty. Affect pleasant  Stable post-PCI. Watch Scr closely.   Continue heparin overnight. Switch to Eliquis in am if stable. Probable D/C on Thursday prior to d/c  Arvilla Meres, MD  10:39 AM

## 2023-09-16 NOTE — Patient Instructions (Signed)
Visit Information  Thank you for taking time to visit with me today. Please don't hesitate to contact me if I can be of assistance to you before our next scheduled telephone appointment.  Following are the goals we discussed today:   Goals Addressed             This Visit's Progress    Transitions of Care/ pt will have no readmission within 30 days       Current Barriers:  Chronic Disease Management support and education needs related to CHF and GI bleed  Patient reports doing well since discharge, has "no GI bleeding and never did have any"  pt states " they are not sure where bleeding is coming from"  pt states he lives alone, has son that checks in on him regularly and assists with transportation, 12/17- no GI bleeding reported Patient reports he has a scale and will start weighing again when he is more stable on his feet, home health weighed pt at 189 pounds, has walker he uses regularly Patient reports physical therapist came to his home, completed evaluation and will be working with him, hopefully this will help balance so pt can stand on the scales and work on endurance, activity tolerance as pt continues to have dyspnea with exertion. Patient reports he has no LE edema when he first wakes up but does have some edema as the day goes on and he is up on his feet. Patient saw primary care provider on 12/3, saw cardiology on 12/10 and has cardiac cath scheduled for 12/18, follow up with cardiologist on 12/31 @ 9 am, will follow up with rheumatologist 10/07/23 as a new patient 09/16/23- spoke with patient who reports he had "procedure this morning, cardiac cath" and still at the hospital recuperating, requests call back next week  RNCM Clinical Goal(s):  Patient will work with the Care Management team over the next 30 days to address Transition of Care Barriers: Home Health services verbalize understanding of plan for management of CHF and GI bleed as evidenced by patient report, review of  EMR take all medications exactly as prescribed and will call provider for medication related questions as evidenced by patient report, refill history and review of EMR attend all scheduled medical appointments: primary care provider, GI and cardiology as evidenced by patient report, review of EMR  through collaboration with RN Care manager, provider, and care team.   Interventions: Evaluation of current treatment plan related to  self management and patient's adherence to plan as established by provider   Heart Failure Interventions:  (Status:  New goal. and Goal on track:  Yes.) Short Term Goal Basic overview and discussion of pathophysiology of Heart Failure reviewed Reviewed HF action plan, importance of calling provider early on for change in health status/ symptoms Reviewed medications Reviewed all upcoming scheduled appointments  Patient had coronary stent intervention this morning and still at hospital, will outreach pt next week  GI Bleed: Reviewed signs/ symptoms GI bleeding Reviewed importance of reporting any s/s GI bleeding to provider Reviewed low sodium diet Reviewed importance of continuing to work with home health  Patient Goals/Self-Care Activities: Participate in Transition of Care Program/Attend [facility] scheduled calls Notify RN Care Manager of TOC call rescheduling needs Take all medications as prescribed Attend all scheduled provider appointments Call pharmacy for medication refills 3-7 days in advance of running out of medications Call provider office for new concerns or questions  call office if I gain more than 2 pounds in  one day or 5 pounds in one week keep legs up while sitting use salt in moderation watch for swelling in feet, ankles and legs every day weigh myself daily develop a rescue plan follow rescue plan if symptoms flare-up eat more whole grains, fruits and vegetables, lean meats and healthy fats dress right for the weather, hot or cold Report any  signs/ symptoms of GI bleeding to your doctor Continue working with home health PT, complete exercises they give you to do RN care manager will contact you next week  Follow Up Plan:  Telephone follow up appointment with care management team member scheduled for:  09/26/23 @ 11am           Our next appointment is by telephone on 09/26/23 @ 11 am  Please call the care guide team at [phone] if you need to cancel or reschedule your appointment.   If you are experiencing a Mental Health or Behavioral Health Crisis or need someone to talk to, please call the Suicide and Crisis Lifeline: 988 call the [place] National Suicide Prevention Lifeline: (410)172-9598 or TTY: (458)501-3747 TTY 9563144482) to talk to a trained counselor call 1-800-273-TALK (toll free, 24 hour hotline) go to [facility] [address] [place] [phone]) call the [facility] Crisis Line: [phone] call 911   Patient verbalizes understanding of instructions and care plan provided today and agrees to view in MyChart. Active MyChart status and patient understanding of how to access instructions and care plan via MyChart confirmed with patient.     Irving Shows [facility], BSN RN Care Manager/ Transition of Care [place] [phone]

## 2023-09-16 NOTE — Progress Notes (Signed)
PHARMACY - ANTICOAGULATION CONSULT NOTE  Pharmacy Consult for UFH IV Infusion Indication: chest pain/ACS and atrial fibrillation  No Known Allergies  Patient Measurements: Height: 5\' 8"  (172.7 cm) Weight: 80.4 kg (177 lb 4 oz) IBW/kg (Calculated) : 68.4 Heparin Dosing Weight: 81.2 kg  Vital Signs: Temp: 97.6 F (36.4 C) (12/23 1103) Temp Source: Oral (12/23 1103) BP: 102/82 (12/23 1103) Pulse Rate: 56 (12/23 1103)  Labs: Recent Labs    09/14/23 0211 09/14/23 1444 09/15/23 0213 09/16/23 0211  HGB 9.8*  --  10.6* 10.7*  HCT 30.4*  --  32.7* 32.6*  PLT 171  --  186 181  APTT 102* 69* 81* 65*  HEPARINUNFRC 0.95*  --  0.62 0.41  CREATININE 2.29*  --  1.89* 1.77*    Estimated Creatinine Clearance: 33.3 mL/min (A) (by C-G formula based on SCr of 1.77 mg/dL (H)).   Assessment: 13 YOM PMH of CVA, persistent AF, and systolic CHF. On 11/24 pt was admitted to [facility] with anemia and hypotension with melena. Pt received 2 units of pRBC and remained hypotensive. Transferred to [facility]. Underwent LHC on 12/18 with severe prox LAD stenosis (99%) and suspected heavily calcified RCA (80%). Plans to evaluate for CABG or PCI with atherectomy at this time. Prior to admission, patient was on Eliquis for [facility] for AF.   Heparin level is at goal at 0.41 this AM, ptt 65. Hgb and Plt stable, no signs of bleeding.   Goal of Therapy:  Heparin level 0.3-0.7 units/ml aPTT 66-102 seconds Monitor platelets by anticoagulation protocol: Yes   Plan:  Heparin stopped for cath lab Resuming Eliquis this evening.  Thank you for allowing pharmacy to participate in this patient's care,  Jenetta Downer, [facility] Clinical Pharmacist  09/16/2023 2:09 PM   [facility] pharmacy phone numbers are listed on [email or web address]

## 2023-09-17 ENCOUNTER — Encounter (HOSPITAL_COMMUNITY): Payer: Self-pay | Admitting: Cardiology

## 2023-09-17 DIAGNOSIS — I5023 Acute on chronic systolic (congestive) heart failure: Secondary | ICD-10-CM | POA: Diagnosis not present

## 2023-09-17 LAB — BASIC METABOLIC PANEL
Anion gap: 10 (ref 5–15)
BUN: 18 mg/dL (ref 8–23)
CO2: 22 mmol/L (ref 22–32)
Calcium: 8.8 mg/dL — ABNORMAL LOW (ref 8.9–10.3)
Chloride: 103 mmol/L (ref 98–111)
Creatinine, Ser: 1.83 mg/dL — ABNORMAL HIGH (ref 0.61–1.24)
GFR, Estimated: 37 mL/min — ABNORMAL LOW (ref >60)
Glucose, Bld: 113 mg/dL — ABNORMAL HIGH (ref 70–99)
Potassium: 4.1 mmol/L (ref 3.5–5.1)
Sodium: 135 mmol/L (ref 135–145)

## 2023-09-17 LAB — CBC
HCT: 31.4 % — ABNORMAL LOW (ref 39.0–52.0)
Hemoglobin: 10.3 g/dL — ABNORMAL LOW (ref 13.0–17.0)
MCH: 31.2 pg (ref 26.0–34.0)
MCHC: 32.8 g/dL (ref 30.0–36.0)
MCV: 95.2 fL (ref 80.0–100.0)
Platelets: 168 10*3/uL (ref 150–400)
RBC: 3.3 MIL/uL — ABNORMAL LOW (ref 4.22–5.81)
RDW: 16.8 % — ABNORMAL HIGH (ref 11.5–15.5)
WBC: 6 10*3/uL (ref 4.0–10.5)
nRBC: 0 % (ref 0.0–0.2)

## 2023-09-17 LAB — APTT: aPTT: 37 s — ABNORMAL HIGH (ref 24–36)

## 2023-09-17 MED ORDER — DAPAGLIFLOZIN PROPANEDIOL 10 MG PO TABS
10.0000 mg | ORAL_TABLET | Freq: Every day | ORAL | Status: DC
Start: 2023-09-17 — End: 2023-09-27
  Administered 2023-09-17 – 2023-09-27 (×11): 10 mg via ORAL
  Filled 2023-09-17 (×12): qty 1

## 2023-09-17 MED ORDER — SPIRONOLACTONE 12.5 MG HALF TABLET
12.5000 mg | ORAL_TABLET | Freq: Every day | ORAL | Status: DC
  Administered 2023-09-17 – 2023-09-27 (×11): 12.5 mg via ORAL
  Filled 2023-09-17 (×11): qty 1

## 2023-09-17 NOTE — Progress Notes (Signed)
Inpatient Rehab Admissions Coordinator Note:   Per therapy recommendations patient was screened for CIR candidacy by Stephania Fragmin, PT. At this time, pt appears to be a potential candidate for CIR. I will place an order for rehab consult for full assessment, per our protocol.  Please contact me any with questions.Estill Dooms, PT, DPT [phone] 09/17/23 2:16 PM

## 2023-09-17 NOTE — Consult Note (Addendum)
[facility] Liaison Consult Note    09/17/2023  Connor Bryant Oct 27, 1945 [id number]  Insurance: [place] [employer] Medicare   Primary Care Provider: Mechele Claude, MD With [facility],  this provider is listed for the transition of care follow up appointments  and [facility] calls   [facility] Liaison reviewed patient for active with Transition of Care team, patient admitted to [facility] on 09/11/23.    The patient was screened for 30 day readmission hospitalization with noted high risk score for unplanned readmission risk 2 hospital admissions in 6 months.  The patient was assessed for potential Community Care Coordination service needs for post hospital transition for care coordination. Review of patient's electronic medical record reveals patient was being followed by [facility] RN..   Plan: [facility] Liaison will continue to follow progress and disposition to asess for post hospital community care coordination/management needs.  Referral request for community care coordination: will update Community TOC RN and make aware of admission to [facility] hospital.  Patient may be a candidate for CIR.   [facility]I Community Care, Population Health does not replace or interfere with any arrangements made by the Inpatient Transition of Care team.   For questions contact:   Charlesetta Shanks, RN, BSN, CCM [place]  [facility], [facility] Liaison Direct Dial: [phone] or secure chat Email: [email or web address]

## 2023-09-17 NOTE — Evaluation (Addendum)
Occupational Therapy Evaluation Patient Details Name: Connor Bryant MRN: [id number] DOB: 1946/08/04 Today's Date: 09/17/2023   History of Present Illness Pt is a 77yo male who presented with ischemic cardiomyopathy and is now s/p coronary stent. PMH: acute on chronic systolic CHF, CAD, a-fib, CVA   Clinical Impression   PTA, pt lived alone and reports that he has been predominantly using a wheelchair in the home; taking seated sponge baths secondary to fatiguing easily; son provides one meal per day and groceries. Pt reporting all standing activity are difficult and with concerns about returning home. Upon eval, pt with significant deconditioning, safety, cardiopulmonary endurance and activity tolerance. Needing up to min A during OOB ADL. Desalting to 88% with very minimal OOB activity today. Pt motivated to get stronger and gain balance prior to going home. Due to significant decline in self care recommending intensive multidisciplinary rehabilitation >3 hours/day to optimize safety and independence in ADL.        If plan is discharge home, recommend the following: Assistance with cooking/housework;Assist for transportation;A little help with walking and/or transfers;A little help with bathing/dressing/bathroom;Help with stairs or ramp for entrance    Functional Status Assessment  Patient has had a recent decline in their functional status and demonstrates the ability to make significant improvements in function in a reasonable and predictable amount of time.  Equipment Recommendations  Other (comment) (defer)    Recommendations for Other Services Rehab consult     Precautions / Restrictions Precautions Precautions: Fall Restrictions Weight Bearing Restrictions Per Provider Order: No      Mobility Bed Mobility Overal bed mobility: Modified Independent Bed Mobility: Supine to Sit, Sit to Supine     Supine to sit: Supervision Sit to supine: Supervision   General bed  mobility comments: increased time and effort    Transfers Overall transfer level: Needs assistance Equipment used: Rolling walker (2 wheels) Transfers: Sit to/from Stand Sit to Stand: Contact guard assist           General transfer comment: verbal cues to push up from bed. CGA for safety; mild sway noted but pt denied dizziness      Balance Overall balance assessment: Needs assistance Sitting-balance support: Feet supported, No upper extremity supported Sitting balance-Leahy Scale: Good     Standing balance support: Bilateral upper extremity supported, Reliant on assistive device for balance Standing balance-Leahy Scale: Poor Standing balance comment: dependent on RW                           ADL either performed or assessed with clinical judgement   ADL Overall ADL's : Needs assistance/impaired Eating/Feeding: Sitting;Modified independent   Grooming: Contact guard assist;Standing   Upper Body Bathing: Set up;Sitting   Lower Body Bathing: Contact guard assist;Minimal assistance;Sit to/from stand   Upper Body Dressing : Set up;Sitting   Lower Body Dressing: Contact guard assist;Minimal assistance   Toilet Transfer: Minimal assistance Toilet Transfer Details (indicate cue type and reason): intermittent min A; pt with signf9iciantly decr respiratory status and desatting ton 88 during very short distance mobility         Functional mobility during ADLs: Contact guard assist;Rolling walker (2 wheels);Minimal assistance General ADL Comments: significant deconditioning and DOE     Vision Patient Visual Report: No change from baseline Additional Comments: decreased peripheral vision L eye from previous CVA. Appears to also be present in R eye nasally.     Perception  Praxis         Pertinent Vitals/Pain Pain Assessment Pain Assessment: No/denies pain     Extremity/Trunk Assessment Upper Extremity Assessment Upper Extremity Assessment:  Overall WFL for tasks assessed   Lower Extremity Assessment Lower Extremity Assessment: Generalized weakness   Cervical / Trunk Assessment Cervical / Trunk Assessment: Kyphotic   Communication Communication Communication: No apparent difficulties   Cognition Arousal: Alert Behavior During Therapy: Flat affect Overall Cognitive Status: Within Functional Limits for tasks assessed                                 General Comments: pt aware of deficits including loss of peripheral vision in L eye from recent CVA, flat affect/depressed spirits due to not being safe at home by himself. Following up to 2 step commands.     General Comments  SpO2 desat to 83% with minimal mobility OOB    Exercises     Shoulder Instructions      Home Living Family/patient expects to be discharged to:: Private residence Living Arrangements: Alone Available Help at Discharge: Family;Friend(s);Available PRN/intermittently Type of Home: House Home Access: Level entry     Home Layout: One level     Bathroom Shower/Tub: Producer, television/film/video: Standard Bathroom Accessibility:  (can't access with w/c but can with RW)   Home Equipment: Rolling Walker (2 wheels);Cane - single point;BSC/3in1;Adaptive equipment Adaptive Equipment: Reacher;Sock aid;Long-handled shoe horn Additional Comments: wife passed away in 03/09/23; does not have a shower seat since he reports his shower is too small for one.      Prior Functioning/Environment Prior Level of Function : Needs assist             Mobility Comments: for the last 6 weeks pt has been using a w/c in the house and [employer], hasn't been driving to grocery store, son brings dinner to him and does grocery shopping ADLs Comments: sponge bathing, fixes cereal for breakfast, doesn't eat lunch, and son brings dinner. has sock aide but does not know how to use per reportn        OT Problem List: Decreased activity  tolerance;Cardiopulmonary status limiting activity      OT Treatment/Interventions: Self-care/ADL training;Therapeutic exercise    OT Goals(Current goals can be found in the care plan section) Acute Rehab OT Goals Patient Stated Goal: get better and go to rehab OT Goal Formulation: With patient Time For Goal Achievement: 10/01/23 Potential to Achieve Goals: Good  OT Frequency: Min 1X/week    Co-evaluation              AM-PAC OT "6 Clicks" Daily Activity     Outcome Measure Help from another person eating meals?: None Help from another person taking care of personal grooming?: A Little Help from another person toileting, which includes using toliet, bedpan, or urinal?: A Little Help from another person bathing (including washing, rinsing, drying)?: A Little Help from another person to put on and taking off regular upper body clothing?: A Little Help from another person to put on and taking off regular lower body clothing?: A Little 6 Click Score: 19   End of Session Equipment Utilized During Treatment: Gait belt;Rolling walker (2 wheels) Nurse Communication: Mobility status  Activity Tolerance: Patient tolerated treatment well Patient left: in bed;with call bell/phone within reach;with bed alarm set  OT Visit Diagnosis: Other abnormalities of gait and mobility (R26.89);Muscle weakness (generalized) (M62.81)  Time: 6295-2841 OT Time Calculation (min): 24 min Charges:  OT General Charges $OT Visit: 1 Visit OT Evaluation $OT Eval Moderate Complexity: 1 Mod OT Treatments $Self Care/Home Management : 8-22 mins  Tyler Deis, OTR/L [facility] Acute Rehabilitation Office: [phone]   Myrla Halsted 09/17/2023, 3:23 PM

## 2023-09-17 NOTE — Plan of Care (Signed)
  Problem: Education: Goal: Understanding of CV disease, CV risk reduction, and recovery process will improve Outcome: Progressing Goal: Individualized Educational Video(s) Outcome: Progressing   Problem: Activity: Goal: Ability to return to baseline activity level will improve Outcome: Progressing   Problem: Cardiovascular: Goal: Ability to achieve and maintain adequate cardiovascular perfusion will improve Outcome: Progressing Goal: Vascular access site(s) Level 0-1 will be maintained Outcome: Progressing   Problem: Health Behavior/Discharge Planning: Goal: Ability to safely manage health-related needs after discharge will improve Outcome: Progressing   Problem: Education: Goal: Knowledge of General Education information will improve Description: Including pain rating scale, medication(s)/side effects and non-pharmacologic comfort measures Outcome: Progressing   Problem: Health Behavior/Discharge Planning: Goal: Ability to manage health-related needs will improve Outcome: Progressing   Problem: Clinical Measurements: Goal: Ability to maintain clinical measurements within normal limits will improve Outcome: Progressing Goal: Will remain free from infection Outcome: Progressing Goal: Diagnostic test results will improve Outcome: Progressing Goal: Respiratory complications will improve Outcome: Progressing Goal: Cardiovascular complication will be avoided Outcome: Progressing   Problem: Activity: Goal: Risk for activity intolerance will decrease Outcome: Progressing   Problem: Nutrition: Goal: Adequate nutrition will be maintained Outcome: Progressing   Problem: Coping: Goal: Level of anxiety will decrease Outcome: Progressing   Problem: Elimination: Goal: Will not experience complications related to bowel motility Outcome: Progressing Goal: Will not experience complications related to urinary retention Outcome: Progressing   Problem: Pain Management: Goal:  General experience of comfort will improve Outcome: Progressing   Problem: Safety: Goal: Ability to remain free from injury will improve Outcome: Progressing   Problem: Skin Integrity: Goal: Risk for impaired skin integrity will decrease Outcome: Progressing   Problem: Education: Goal: Ability to demonstrate management of disease process will improve Outcome: Progressing Goal: Ability to verbalize understanding of medication therapies will improve Outcome: Progressing Goal: Individualized Educational Video(s) Outcome: Progressing   Problem: Activity: Goal: Capacity to carry out activities will improve Outcome: Progressing   Problem: Cardiac: Goal: Ability to achieve and maintain adequate cardiopulmonary perfusion will improve Outcome: Progressing

## 2023-09-17 NOTE — Care Management (Signed)
09-17-23 1435 Case Manager received a notice from PT that the patient would benefit from CIR. Case Manager reached out the CIR admissions Coordinator and she states the patient may be a potential candidate for CIR. Unit Case Manager will continue to follow for transition of care needs.

## 2023-09-17 NOTE — Progress Notes (Addendum)
Advanced Heart Failure Rounding Note  Cardiologist: Dina Rich, MD  Chief Complaint: Acute on Chronic Systolic Heart Failure  Subjective:    12/23: PCI w DES x2 to LAD and RCA.   Referred to Cardiac Rehab.   Ambulating with PT this morning. Did not sleep well due to nasal drainage and cough overnight. No SOB at rest, however shortness of breath with ambulation. Reported mild dizziness with standing. No CP. Will obtain orthostatic vital signs.   Objective:    Weight Range: 80.1 kg Body mass index is 26.85 kg/m.   Vital Signs:   Temp:  [97.6 F (36.4 C)-98.4 F (36.9 C)] 98.4 F (36.9 C) (12/24 0546) Pulse Rate:  [50-64] 62 (12/24 0546) Resp:  [12-20] 17 (12/24 0546) BP: (80-135)/(41-82) 120/61 (12/24 0546) SpO2:  [91 %-100 %] 93 % (12/24 0546) Weight:  [80.1 kg] 80.1 kg (12/24 0546) Last BM Date : 09/15/23  Weight change: Filed Weights   09/15/23 0342 09/16/23 0347 09/17/23 0546  Weight: 81.2 kg 80.4 kg 80.1 kg   Intake/Output:   Intake/Output Summary (Last 24 hours) at 09/17/2023 0829 Last data filed at 09/17/2023 0549 Gross per 24 hour  Intake 480 ml  Output 1150 ml  Net -670 ml   Physical Exam    General: Well appearing. No distress on RA HEENT: neck supple.   Cardiac: JVP ~6 cm. S1 and S2 present. 3/6 AS murmur Resp: Lung sounds clear. Bases diminished Abdomen: Soft, non-tender, non-distended. + BS. Extremities: Warm and dry. No rash, cyanosis.  No edema.  Neuro: Alert and oriented x3. Flat Affect. Moves all extremities without difficulty.  Telemetry   SR in 60s (personally reviewed)  EKG    No new EKG to review  Labs    CBC Recent Labs    09/16/23 0211 09/17/23 0215  WBC 6.1 6.0  HGB 10.7* 10.3*  HCT 32.6* 31.4*  MCV 95.0 95.2  PLT 181 168   Basic Metabolic Panel Recent Labs    86/57/84 0213 09/16/23 0211 09/17/23 0215  NA 136 134* 135  K 3.7 3.7 4.1  CL 100 101 103  CO2 25 22 22   GLUCOSE 93 104* 113*  BUN 22 21  18   CREATININE 1.89* 1.77* 1.83*  CALCIUM 8.7* 8.7* 8.8*  MG 2.2 1.9  --    Liver Function Tests No results for input(s): "AST", "ALT", "ALKPHOS", "BILITOT", "PROT", "ALBUMIN" in the last 72 hours.  No results for input(s): "LIPASE", "AMYLASE" in the last 72 hours. Cardiac Enzymes No results for input(s): "CKTOTAL", "CKMB", "CKMBINDEX", "TROPONINI" in the last 72 hours.  BNP: BNP (last 3 results) Recent Labs    08/19/23 0213 09/03/23 1621 09/11/23 1637  BNP 1,228.1* 1,602.8* 1,478.1*   ProBNP (last 3 results) No results for input(s): "PROBNP" in the last 8760 hours.  D-Dimer No results for input(s): "DDIMER" in the last 72 hours. Hemoglobin A1C No results for input(s): "HGBA1C" in the last 72 hours. Fasting Lipid Panel No results for input(s): "CHOL", "HDL", "LDLCALC", "TRIG", "CHOLHDL", "LDLDIRECT" in the last 72 hours. Thyroid Function Tests No results for input(s): "TSH", "T4TOTAL", "T3FREE", "THYROIDAB" in the last 72 hours.  Invalid input(s): "FREET3"  Other results:  Imaging   CARDIAC CATHETERIZATION Result Date: 09/16/2023   Prox LAD to Mid LAD lesion is 99% stenosed.   A drug-eluting stent was successfully placed using a SYNERGY XD 2.50X24.   Post intervention, there is a 0% residual stenosis.   Ost RCA lesion is 80% stenosed.  A drug-eluting stent was successfully placed using a SYNERGY XD 3.0X20.   A stent was successfully placed.   Post intervention, there is a 0% residual stenosis.   Post intervention, there is a 0% residual stenosis.   Recommend to resume Apixaban, at currently prescribed dose and frequency on 09/16/2023.   Recommend concurrent antiplatelet therapy of Clopidogrel 75mg  daily for 12 months. Successful PCI of the mid LAD with IVUS guidance, orbital atherectomy and DES x 1 Successful PCI of the ostial RCA with IVUS guidance, Shockwave lithotripsy and DES x 1 Plan: may resume Eliquis tonight. Given high bleeding risk would treat with Plavix  monotherapy for 12 months/no ASA    Medications:    Scheduled Medications:  allopurinol  100 mg Oral BID   amiodarone  200 mg Oral Daily   apixaban  5 mg Oral BID   atorvastatin  80 mg Oral Daily   carvedilol  12.5 mg Oral BID WC   clopidogrel  75 mg Oral Q breakfast   feeding supplement  237 mL Oral BID BM   ferrous sulfate  325 mg Oral Daily   folic acid  1 mg Oral Daily   Living Better with Heart Failure Book   Does not apply Once   pantoprazole  40 mg Oral BID   polyethylene glycol  17 g Oral Daily   sodium chloride flush  3 mL Intravenous Q12H   sodium chloride flush  3 mL Intravenous Q12H    Infusions:  sodium chloride      PRN Medications: sodium chloride, acetaminophen, docusate sodium, ondansetron (ZOFRAN) IV, sodium chloride flush, sodium chloride flush  Patient Profile   Connor Bryant is a 77 y.o. male with past medical history of CVA, persistent Afib, and chronic systolic heart failure.   Assessment/Plan   1. Acute on chronic systolic CHF: Drop in EF in 8/21 to 35% with apical akinesis in setting CVA with improvement to normal EF by 10/21. This was thought to be due to stress cardiomyopathy.  Echo in 11/24 showed EF back down to EF 30-35% with diffuse hypokinesis, normal RV, PASP 60 mmHg, mild-moderate MR, moderate AS. This was in the setting of atrial fibrillation and anemia. I did not think that atrial fibrillation was the cause of this as rate was well-controlled (not tachycardia-mediated). LHC/RHC 12/18 showed critical proximal LAD and severe ostial RCA stenoses, suggesting an ischemic cardiomyopathy. Mildly elevated RA pressure, elevated PCWP with prominent v-waves, normal LVEDP. Simultaneous LV/PCWP pressures were not measured, but PCWP 29 and LVEDP 11 suggests gradient across mitral valve with significant mitral stenosis. Moderate pulmonary venous hypertension. Low but not markedly low cardiac output (CI 2.23 Fick, 2.03 thermo).  Now s/p PCA w DES x2 to LAD and  RCA. - Echo 09/12/23: EF 30-35%, mod reduced RV function. Mild MR and Mod AS.  - Does not appear volume up on exam, would hold on diuretics for now. Weight continues to trend down. - Restart Farxiga 10 mg and Sprio 12.5 mg daily. Follow sCr closely.  - Continue Coreg 12.5 mg bid. Obtain orthostatic vital signs. 2. CAD: Patient has not had any chest pain. He has had exertional dyspnea. Echo in 11/24 showed EF down to 30-35%. Cath 12/18 showed severe, heavily calcified proximal LAD stenosis after D1 and S1 (99%) with TIMI 2 flow down the distal LAD. Suspect 80% heavily calcified ostial RCA stenosis. Suspect ischemic cardiomyopathy.  - s/p PCI w DES x2 to LAD and RCA. Will need monotherapy antiplatelet with Plavix, no  ASA for 12 mo. - Continue Plavix - Continue statin + ASA 81 daily 3. Atrial fibrillation: Persistent. He is back on Eliquis after recent GI bleeding from erosive gastritis. He was started on amiodarone at 11/24 admission. NSR on tele.  - Resume Eliquis - Continue amiodarone 200 mg daily - If he has further GI bleeding, would consider Watchman for him.  4. CVA: 8/21, suspect due to atrial fibrillation.   5. PE: In 8/21, he is on Eliquis.  6. Aortic stenosis: AS looked moderate on 11/24 echo. S2 can be heard clearly. However, aortic valve was easily crossed on cath 09/11/23 and peak-to-peak gradient was only 8 mmHg.  - Mod stenosis present on Echo 09/12/23 7. PAD: H/o iliac stents and mesenteric artery bypass. No claudication or intestinal angina. No pedal ulcerations.  - Continue statin.  8. Carotid stenosis: 40-59% L ICA and 1-39% R ICA stenosis on 09/12/23. 9. GI bleeding: 11/24 GI bleeding from erosive esophagitis. CBC has been stable with no overt bleeding.  - Avoid ETOH.  10. CKD stage 3: sCr at baseline ~1.4-1.7. Follow BMET closely.   - Peaked at 2.3. GDMT and diuresis held. Now sCr 1.83 post cath. GDMT as above.  - Follow sCr closely. 11. Mitral stenosis: Mild mitral  stenosis on 11/24 echo with mean gradient 5 mmHg.  On cath 09/11/23, simultaneous LV/PCWP pressures were not measured, but PCWP 29 and LVEDP 11 suggests gradient across mitral valve with significant mitral stenosis.  - no mitral stenosis noted on 12/24 echo. Mild MR. 12. Hypokalemia - K 4.1 this am  Length of Stay: 6  [place] Lee, NP  09/17/2023, 8:29 AM  Advanced Heart Failure Team Pager [phone] (M-F; 7a - 5p)  Please contact [facility] for night-coverage after hours (5p -7a ) and weekends on [email or web address]  Patient seen and examined with the above-signed Advanced Practice Provider and/or Housestaff. I personally reviewed laboratory data, imaging studies and relevant notes. I independently examined the patient and formulated the important aspects of the plan. I have edited the note to reflect any of my changes or salient points. I have personally discussed the plan with the patient and/or family.  S/p DES to RCA and LAD. Denies CP or SOB. Feels weak. Was able to walk 68 feet to day with Mobility team. Lives alone at home. Son in [place]  General:  Weak appearing. No resp difficulty HEENT: normal Neck: supple. no JVD. Carotids 2+ bilat; no bruits. No lymphadenopathy or thryomegaly appreciated. Cor: PMI nondisplaced. Regular rate & rhythm. 2/6 AS Lungs: clear Abdomen: soft, nontender, nondistended. No hepatosplenomegaly. No bruits or masses. Good bowel sounds. Extremities: no cyanosis, clubbing, rash, edema Neuro: alert & orientedx3, cranial nerves grossly intact. moves all 4 extremities w/o difficulty. Affect pleasant  He is stable from cardiac perspective but very weak. He is not interested in SNF. We discussed with PT who suggested possible CIR or [facility]. He is willing to consider these if he qualifies, otherwise plan would be home tomorrow with family support.   Continue Plavix/Eliquis/statin.   Arvilla Meres, MD  12:56 PM

## 2023-09-17 NOTE — Evaluation (Signed)
Physical Therapy Evaluation Patient Details Name: EINAR KRIEG MRN: [id number] DOB: [birth year] Today's Date: 09/17/2023  History of Present Illness  Pt is a 77yo male who presented with ischemic cardiomyopathy and is now s/p coronary stent. PMH: acute on chronic systolic CHF, CAD, a-fib, CVA   Clinical Impression  Pt admitted with above. Pt indep without AD up until 6 weeks ago when he began using a w/c and walk in his home, stopped driving, and relied on son to bring his groceries and dinner. Pt with noted deconditioning, DOE and drop in SpO2 to 83% on RA during ambulation. Pt now requiring use of RW and is amb about 48' prior to needing to sit. Pt with impaired balance and decreased tolerance to stand for extended periods of time to bath or perform ADLs at sink. Spoke at length with patient regarding returning home alone and expressed concern about falling due to narrow spaces and would like to be stronger prior to returning home. This PT agrees with pt's insight to his deficits and that he would be at a significant falls risk if he returned home alone at this time. However, pt demonstrates excellent rehab potential to achieve safe mod I level of function with aggressive inpatient rehab program > 3 hours a day. Acute PT to cont to follow.        If plan is discharge home, recommend the following: A little help with walking and/or transfers;A little help with bathing/dressing/bathroom;Assistance with cooking/housework;Assist for transportation;Direct supervision/assist for medications management   Can travel by private vehicle        Equipment Recommendations None recommended by PT  Recommendations for Other Services  Rehab consult    Functional Status Assessment Patient has had a recent decline in their functional status and demonstrates the ability to make significant improvements in function in a reasonable and predictable amount of time.     Precautions / Restrictions  Precautions Precautions: Fall Restrictions Weight Bearing Restrictions Per Provider Order: No      Mobility  Bed Mobility Overal bed mobility: Modified Independent Bed Mobility: Supine to Sit, Sit to Supine     Supine to sit: Supervision Sit to supine: Contact guard assist   General bed mobility comments: HOB elevated, states his bed at home is flat and has increased difficulty getting up to EOB, pt with definite use of bed rails, no physical assist but increased time and effore    Transfers Overall transfer level: Needs assistance Equipment used: Rolling walker (2 wheels) Transfers: Sit to/from Stand Sit to Stand: Contact guard assist           General transfer comment: verbal cues to push up from bed, no carry over for the next 2 sit to stands requiring continued verbal cues to push up from bed and reach back for bed vs pulling up on RW    Ambulation/Gait Ambulation/Gait assistance: Min assist Gait Distance (Feet): 70 Feet Assistive device: Rolling walker (2 wheels) Gait Pattern/deviations: Step-through pattern, Decreased stride length Gait velocity: dec Gait velocity interpretation: <1.31 ft/sec, indicative of household ambulator   General Gait Details: runs into objects on the L due to impaired peripheral vision, SpO2 dec to 83% on RA, pt with report of mild dizziness and SOB s/p 40 ', after seated rest break, pt placed on 2LO2 via [place], pt dropped to 87% but didn't report SOB until about 60'  Acupuncturist  Bed    Modified Rankin (Stroke Patients Only)       Balance Overall balance assessment: Needs assistance Sitting-balance support: Feet supported, No upper extremity supported Sitting balance-Leahy Scale: Good     Standing balance support: Bilateral upper extremity supported, Reliant on assistive device for balance Standing balance-Leahy Scale: Poor Standing balance comment: dependent on RW                              Pertinent Vitals/Pain Pain Assessment Pain Assessment: No/denies pain    Home Living Family/patient expects to be discharged to:: Private residence Living Arrangements: Alone Available Help at Discharge: Family;Friend(s);Available PRN/intermittently Type of Home: House Home Access: Level entry       Home Layout: One level Home Equipment: Agricultural consultant (2 wheels);Gilmer Mor - single point;BSC/3in1 Additional Comments: wife passed away in 03-21-23; does not have a shower seat since he reports his shower is too small for one.    Prior Function Prior Level of Function : Needs assist             Mobility Comments: for the last 6 weeks pt has been using a w/c in the house and BSC, hasn't been driving to grocery store, son brings dinner to him and does grocery shopping ADLs Comments: sponge bathing, fixes cereal for breakfast, doesn't eat lunch, and son brings dinner     Extremity/Trunk Assessment   Upper Extremity Assessment Upper Extremity Assessment: Overall WFL for tasks assessed    Lower Extremity Assessment Lower Extremity Assessment: Generalized weakness    Cervical / Trunk Assessment Cervical / Trunk Assessment: Kyphotic  Communication   Communication Communication: No apparent difficulties  Cognition Arousal: Alert Behavior During Therapy: Flat affect Overall Cognitive Status: Within Functional Limits for tasks assessed                                 General Comments: pt aware of deficits including loss of peripheral vision in L eye from recent CVA, flat affect/depressed spirits due to not being safe at home by himself        General Comments General comments (skin integrity, edema, etc.): SPO2 dec to 83% on RA during ambulation    Exercises     Assessment/Plan    PT Assessment Patient needs continued PT services  PT Problem List Decreased strength;Decreased activity tolerance;Decreased balance;Decreased  mobility;Decreased knowledge of use of DME;Cardiopulmonary status limiting activity       PT Treatment Interventions DME instruction;Gait training;Stair training;Functional mobility training;Therapeutic activities;Therapeutic exercise;Balance training;Neuromuscular re-education;Patient/family education    PT Goals (Current goals can be found in the Care Plan section)  Acute Rehab PT Goals Patient Stated Goal: get stronger to go home PT Goal Formulation: With patient Time For Goal Achievement: 10/01/23 Potential to Achieve Goals: Good    Frequency Min 1X/week     Co-evaluation               AM-PAC PT "6 Clicks" Mobility  Outcome Measure Help needed turning from your back to your side while in a flat bed without using bedrails?: A Little Help needed moving from lying on your back to sitting on the side of a flat bed without using bedrails?: A Little Help needed moving to and from a bed to a chair (including a wheelchair)?: A Little Help needed standing up from a chair using your arms (e.g., wheelchair or  bedside chair)?: A Lot Help needed to walk in hospital room?: A Lot Help needed climbing 3-5 steps with a railing? : A Lot 6 Click Score: 15    End of Session Equipment Utilized During Treatment: Gait belt Activity Tolerance: Patient limited by fatigue Patient left: in bed;with call bell/phone within reach Nurse Communication: Mobility status PT Visit Diagnosis: Other abnormalities of gait and mobility (R26.89);Muscle weakness (generalized) (M62.81)    Time: 1000-1042 PT Time Calculation (min) (ACUTE ONLY): 42 min   Charges:   PT Evaluation $PT Eval Moderate Complexity: 1 Mod PT Treatments $Gait Training: 23-37 mins PT General Charges $$ ACUTE PT VISIT: 1 Visit         Lewis Shock, PT, DPT Acute Rehabilitation Services Secure chat preferred Office #: [phone]   Iona Hansen 09/17/2023, 11:34 AM

## 2023-09-17 NOTE — Progress Notes (Signed)
CARDIAC REHAB PHASE I   PRE:  Rate/Rhythm: 64 NSR  BP:  Sitting: 120/61      SpO2: 95 RA  MODE:  Ambulation: 68 ft    POST:  Rate/Rhythm: 79 NSR  BP:  Sitting: 130/87      SpO2: 94-96 RA  Pt ambulated with CGA using gait belt in the hallway with RW, pt reported 3/10 dizziness and SOB while ambulating (Sats stable). Pt was helped to room and educated on stent restrictions and HF.   Pt was educated on stent location, Antiplatelet use, wt restrictions, no baths/daily wash-ups, s/s of infection, daily weights, ex guidelines, s/s to stop exercising, NTG use and calling 911, heart healthy and low na diet, risk factors and CRPII. Pt received materials on exercise, diet, and CRPII. Will refer to AP.    Faustino Congress  MS, ACSM-CEP 9:02 AM 09/17/2023    Service time is from 0827 to 0902.

## 2023-09-17 NOTE — Care Management Important Message (Signed)
Important Message  Patient Details  Name: Connor Bryant MRN: [id number] Date of Birth: 1945/11/02   Important Message Given:  Yes - Medicare IM     Dorena Bodo 09/17/2023, 12:10 PM

## 2023-09-17 NOTE — Plan of Care (Signed)
  Problem: Activity: Goal: Ability to return to baseline activity level will improve Outcome: Progressing   Problem: Activity: Goal: Risk for activity intolerance will decrease Outcome: Progressing   Problem: Nutrition: Goal: Adequate nutrition will be maintained Outcome: Progressing   Problem: Coping: Goal: Level of anxiety will decrease Outcome: Progressing   Problem: Elimination: Goal: Will not experience complications related to bowel motility Outcome: Progressing Goal: Will not experience complications related to urinary retention Outcome: Progressing   Problem: Pain Management: Goal: General experience of comfort will improve Outcome: Progressing   Problem: Safety: Goal: Ability to remain free from injury will improve Outcome: Progressing   Problem: Skin Integrity: Goal: Risk for impaired skin integrity will decrease Outcome: Progressing

## 2023-09-17 NOTE — Progress Notes (Signed)
   09/17/23 1708  Spiritual Encounters  Type of Visit Initial  Care provided to: Family  Referral source Family  Reason for visit Routine spiritual support  OnCall Visit Yes   Responded to request from visiting family, met in hallway, provided spiritual care, accompanied family to patient's room.

## 2023-09-18 DIAGNOSIS — D539 Nutritional anemia, unspecified: Secondary | ICD-10-CM | POA: Diagnosis not present

## 2023-09-18 DIAGNOSIS — I2510 Atherosclerotic heart disease of native coronary artery without angina pectoris: Secondary | ICD-10-CM | POA: Diagnosis not present

## 2023-09-18 DIAGNOSIS — I5022 Chronic systolic (congestive) heart failure: Secondary | ICD-10-CM | POA: Diagnosis not present

## 2023-09-18 LAB — APTT: aPTT: 41 s — ABNORMAL HIGH (ref 24–36)

## 2023-09-18 MED ORDER — VITAMIN B-6 25 MG PO TABS
50.0000 mg | ORAL_TABLET | Freq: Every day | ORAL | Status: DC
Start: 2023-09-18 — End: 2023-09-27
  Administered 2023-09-18 – 2023-09-27 (×10): 50 mg via ORAL
  Filled 2023-09-18 (×10): qty 2

## 2023-09-18 NOTE — Progress Notes (Signed)
Progress Note  Patient Name: Connor Bryant Date of Encounter: 09/18/2023 Primary Cardiologist: Dina Rich, MD   Subjective   Overnight patient was found to be a candidate for inpatient rehab.. Nursing notes that he is rapidly gaining strength here.  No CP, SOB, Palpitations.  He now wants to go to rehab.  Doesn't feel strong enough to DC.  He believes his BP meds gave him mitral stenosis.  He has concerns over plavix.  Vital Signs    Vitals:   09/17/23 1747 09/17/23 1926 09/17/23 2249 09/18/23 0553  BP: 112/65 (!) 116/51 107/60 120/61  Pulse: 71 66 64 68  Resp:  (!) 22 18 19   Temp:  99.7 F (37.6 C) 99.2 F (37.3 C) 97.6 F (36.4 C)  TempSrc:  Oral Oral Oral  SpO2:  96% 97% 97%  Weight:    79.4 kg  Height:        Intake/Output Summary (Last 24 hours) at 09/18/2023 0638 Last data filed at 09/18/2023 0555 Gross per 24 hour  Intake 100 ml  Output 1300 ml  Net -1200 ml   Filed Weights   09/16/23 0347 09/17/23 0546 09/18/23 0553  Weight: 80.4 kg 80.1 kg 79.4 kg    Physical Exam   GEN: No acute distress.   Neck: No JVD Cardiac: RRR, no rubs, or gallops, systolic murmur Respiratory: Clear to auscultation bilaterally. GI: Soft, nontender, non-distended  MS: No edema  Labs   Telemetry: SR.  On small episode of AF.   Chemistry Recent Labs  Lab 09/11/23 1637 09/12/23 0210 09/15/23 0213 09/16/23 0211 09/17/23 0215  NA 142   < > 136 134* 135  K 2.9*   < > 3.7 3.7 4.1  CL 100   < > 100 101 103  CO2 28   < > 25 22 22   GLUCOSE 86   < > 93 104* 113*  BUN 16   < > 22 21 18   CREATININE 1.48*   < > 1.89* 1.77* 1.83*  CALCIUM 7.7*   < > 8.7* 8.7* 8.8*  PROT 6.1*  --   --   --   --   ALBUMIN 3.0*  --   --   --   --   AST 26  --   --   --   --   ALT 29  --   --   --   --   ALKPHOS 68  --   --   --   --   BILITOT 1.7*  --   --   --   --   GFRNONAA 48*   < > 36* 39* 37*  ANIONGAP 14   < > 11 11 10    < > = values in this interval not displayed.      Hematology Recent Labs  Lab 09/15/23 0213 09/16/23 0211 09/17/23 0215  WBC 6.2 6.1 6.0  RBC 3.48* 3.43* 3.30*  HGB 10.6* 10.7* 10.3*  HCT 32.7* 32.6* 31.4*  MCV 94.0 95.0 95.2  MCH 30.5 31.2 31.2  MCHC 32.4 32.8 32.8  RDW 16.9* 16.7* 16.8*  PLT 186 181 168    Cardiac EnzymesNo results for input(s): "TROPONINI" in the last 168 hours. No results for input(s): "TROPIPOC" in the last 168 hours.   BNP Recent Labs  Lab 09/11/23 1637  BNP 1,478.1*     DDimer No results for input(s): "DDIMER" in the last 168 hours.   Cardiac Studies   Cardiac Studies & Procedures  CARDIAC CATHETERIZATION  CARDIAC CATHETERIZATION 09/16/2023  Narrative   Prox LAD to Mid LAD lesion is 99% stenosed.   A drug-eluting stent was successfully placed using a SYNERGY XD 2.50X24.   Post intervention, there is a 0% residual stenosis.   Ost RCA lesion is 80% stenosed.   A drug-eluting stent was successfully placed using a SYNERGY XD 3.0X20.   A stent was successfully placed.   Post intervention, there is a 0% residual stenosis.   Post intervention, there is a 0% residual stenosis.   Recommend to resume Apixaban, at currently prescribed dose and frequency on 09/16/2023.   Recommend concurrent antiplatelet therapy of Clopidogrel 75mg  daily for 12 months.  Successful PCI of the mid LAD with IVUS guidance, orbital atherectomy and DES x 1 Successful PCI of the ostial RCA with IVUS guidance, Shockwave lithotripsy and DES x 1  Plan: may resume Eliquis tonight. Given high bleeding risk would treat with Plavix monotherapy for 12 months/no ASA  Findings Coronary Findings Diagnostic  Dominance: Right  Left Anterior Descending Prox LAD to Mid LAD lesion is 99% stenosed. The lesion is calcified.  Left Circumflex Ost Cx to Prox Cx lesion is 25% stenosed. The lesion is calcified.  Right Coronary Artery Ost RCA lesion is 80% stenosed. The lesion is calcified. Prox RCA to Mid RCA lesion is 30%  stenosed. The lesion is calcified.  Intervention  Prox LAD to Mid LAD lesion Stent CATH VISTA GUIDE 6FR XBLAD3.5 guide catheter was inserted. Lesion crossed with guidewire using a WIRE ASAHI PROWATER 180CM. Pre-stent angioplasty was performed using a BALLN SCOREFLEX 2.50X10. A drug-eluting stent was successfully placed using a SYNERGY XD 2.50X24. Stent strut is well apposed. Post-stent angioplasty was performed using a BALL SAPPHIRE NC24 3.0X12. Maximum pressure:  18 atm. Atherectomy Orbital atherectomy was performed using a CROWN DIAMONDBACK CLASSIC 1.25. 3 passes taken. Post-Intervention Lesion Assessment The intervention was successful. Pre-interventional TIMI flow is 2. Post-intervention TIMI flow is 3. No complications occurred at this lesion. Ultrasound (IVUS) was performed on the lesion post PCI. Stent well apposed. Severe plaque burden detected. Lesion characteristics:  calcified. There is a 0% residual stenosis post intervention.  Ost RCA lesion Stent (Also treats lesions: Prox RCA to Mid RCA) CATH LAUNCHER 6FR AL.75 guide catheter was inserted. Lesion crossed with guidewire using a WIRE ASAHI PROWATER 180CM. Pre-stent angioplasty was performed. A drug-eluting stent was successfully placed using a SYNERGY XD 3.0X20. Stent strut is well apposed. Post-stent angioplasty was performed using a BALL SAPPHIRE NC24 3.5X12. Maximum pressure:  20 atm. Shockwave lithotripsy with 3.0 mm Shockwave balloon for 12 treatments at 10 pulses each. Post-Intervention Lesion Assessment The intervention was successful. Pre-interventional TIMI flow is 3. Post-intervention TIMI flow is 3. No complications occurred at this lesion. Ultrasound (IVUS) was performed on the lesion post PCI. Stent well apposed. Lesion characteristics: calcified. There is a 0% residual stenosis post intervention.  Prox RCA to Mid RCA lesion Stent (Also treats lesions: Ost RCA) See details in Ost RCA lesion. Stent A stent was  successfully placed. Post-Intervention Lesion Assessment The intervention was successful. Pre-interventional TIMI flow is 3. Post-intervention TIMI flow is 3. There is a 0% residual stenosis post intervention.   CARDIAC CATHETERIZATION 09/11/2023  Narrative   Right Heart: Simultaneous LV/PCWP pressures were not measured, but PCWP 29 and LVEDP 11 suggests gradient across mitral valve with significant mitral stenosis.  1. Mildly elevated RA pressure, elevated PCWP with prominent v-waves, normal LVEDP.  Simultaneous LV/PCWP pressures were not measured, but  PCWP 29 and LVEDP 11 suggests gradient across mitral valve with significant mitral stenosis.  Will need echo to reassess mitral valve. 2. Moderate pulmonary venous hypertension. 3. Low but not markedly low cardiac output. 4. Severe, heavily calcified proximal LAD stenosis after D1 and S1 (99%) with TIMI 2 flow down the distal LAD. 5. Suspect 80% heavily calcified ostial RCA stenosis.  Will admit patient, consult TCTS.  If not surgical candidate, would need PCI with atherectomy to LAD and RCA.  Findings Coronary Findings Diagnostic  Dominance: Right  Left Anterior Descending Prox LAD to Mid LAD lesion is 99% stenosed. The lesion is calcified.  Left Circumflex Ost Cx to Prox Cx lesion is 25% stenosed. The lesion is calcified.  Right Coronary Artery Ost RCA lesion is 80% stenosed. The lesion is calcified. Prox RCA to Mid RCA lesion is 30% stenosed. The lesion is calcified.  Intervention  No interventions have been documented.    ECHOCARDIOGRAM  ECHOCARDIOGRAM COMPLETE 09/12/2023  Narrative ECHOCARDIOGRAM REPORT    Patient Name:   ZEVI MARMOL Date of Exam: 09/12/2023 Medical Rec #:  [id number]      Height:       68.0 in Accession #:    [id number]     Weight:       182.5 lb Date of Birth:  April 08, 1946      BSA:          1.966 m Patient Age:    89 years       BP:           103/87 mmHg Patient Gender: M              HR:            56 bpm. Exam Location:  Inpatient  Procedure: 2D Echo, Color Doppler, Cardiac Doppler and Intracardiac Opacification Agent  Indications:    I50.21 Acute systolic (congestive) heart failure  History:        Patient has prior history of Echocardiogram examinations, most recent 08/20/2023. CHF, Arrythmias:Atrial Fibrillation; Risk Factors:Hypertension, Diabetes and Dyslipidemia.  Sonographer:    Irving Burton Senior RDCS Referring Phys: [phone] DALTON S MCLEAN  IMPRESSIONS   1. Left ventricular ejection fraction, by estimation, is 30 to 35%. The left ventricle has moderately decreased function. The left ventricle demonstrates global hypokinesis. There is mild left ventricular hypertrophy. Left ventricular diastolic parameters are indeterminate. 2. Right ventricular systolic function is moderately reduced. The right ventricular size is normal. There is mildly elevated pulmonary artery systolic pressure. The estimated right ventricular systolic pressure is 37.4 mmHg. 3. Left atrial size was moderately dilated. 4. Right atrial size was mildly dilated. 5. The mitral valve is degenerative. Mild mitral valve regurgitation. 6. The aortic valve is calcified. Aortic valve regurgitation is not visualized. Moderate aortic valve stenosis. 7. The inferior vena cava is normal in size with <50% respiratory variability, suggesting right atrial pressure of 8 mmHg.  FINDINGS Left Ventricle: Left ventricular ejection fraction, by estimation, is 30 to 35%. The left ventricle has moderately decreased function. The left ventricle demonstrates global hypokinesis. Definity contrast agent was given IV to delineate the left ventricular endocardial borders. The left ventricular internal cavity size was normal in size. There is mild left ventricular hypertrophy. Left ventricular diastolic parameters are indeterminate.  Right Ventricle: The right ventricular size is normal. Right ventricular systolic function is  moderately reduced. There is mildly elevated pulmonary artery systolic pressure. The tricuspid regurgitant velocity is 2.71 m/s, and with an assumed right atrial  pressure of 8 mmHg, the estimated right ventricular systolic pressure is 37.4 mmHg.  Left Atrium: Left atrial size was moderately dilated.  Right Atrium: Right atrial size was mildly dilated.  Pericardium: There is no evidence of pericardial effusion.  Mitral Valve: The mitral valve is degenerative in appearance. Mild mitral valve regurgitation. MV peak gradient, 7.5 mmHg. The mean mitral valve gradient is 2.0 mmHg.  Tricuspid Valve: Tricuspid valve regurgitation is mild.  Aortic Valve: The aortic valve is calcified. Aortic valve regurgitation is not visualized. Moderate aortic stenosis is present. Aortic valve mean gradient measures 7.0 mmHg. Aortic valve peak gradient measures 12.8 mmHg. Aortic valve area, by VTI measures 0.98 cm.  Pulmonic Valve: Pulmonic valve regurgitation is not visualized.  Aorta: The aortic root and ascending aorta are structurally normal, with no evidence of dilitation.  Venous: The inferior vena cava is normal in size with less than 50% respiratory variability, suggesting right atrial pressure of 8 mmHg.  IAS/Shunts: No atrial level shunt detected by color flow Doppler.   LEFT VENTRICLE PLAX 2D LVIDd:         4.30 cm      Diastology LVIDs:         3.70 cm      LV e' medial:  4.68 cm/s LV PW:         1.00 cm      LV e' lateral: 9.57 cm/s LV IVS:        1.10 cm LVOT diam:     2.00 cm LV SV:         40 LV SV Index:   20 LVOT Area:     3.14 cm  LV Volumes (MOD) LV vol d, MOD A2C: 126.0 ml LV vol d, MOD A4C: 140.0 ml LV vol s, MOD A2C: 78.4 ml LV vol s, MOD A4C: 83.3 ml LV SV MOD A2C:     47.6 ml LV SV MOD A4C:     140.0 ml LV SV MOD BP:      51.2 ml  RIGHT VENTRICLE RV S prime:     5.77 cm/s TAPSE (M-mode): 1.3 cm  LEFT ATRIUM             Index        RIGHT ATRIUM           Index LA  diam:        4.50 cm 2.29 cm/m   RA Area:     21.60 cm LA Vol (A2C):   70.9 ml 36.06 ml/m  RA Volume:   62.70 ml  31.89 ml/m LA Vol (A4C):   90.8 ml 46.19 ml/m LA Biplane Vol: 81.3 ml 41.35 ml/m AORTIC VALVE AV Area (Vmax):    1.09 cm AV Area (Vmean):   1.10 cm AV Area (VTI):     0.98 cm AV Vmax:           179.00 cm/s AV Vmean:          128.000 cm/s AV VTI:            0.409 m AV Peak Grad:      12.8 mmHg AV Mean Grad:      7.0 mmHg LVOT Vmax:         62.30 cm/s LVOT Vmean:        44.700 cm/s LVOT VTI:          0.127 m LVOT/AV VTI ratio: 0.31  AORTA Ao Root diam: 3.00 cm Ao Asc diam:  3.50  cm  MITRAL VALVE                  TRICUSPID VALVE MV Area VTI:  1.16 cm        TR Peak grad:   29.4 mmHg MV Peak grad: 7.5 mmHg        TR Vmax:        271.00 cm/s MV Mean grad: 2.0 mmHg MV Vmax:      1.37 m/s        SHUNTS MV Vmean:     64.1 cm/s       Systemic VTI:  0.13 m MR Peak grad:    59.8 mmHg    Systemic Diam: 2.00 cm MR Mean grad:    45.0 mmHg MR Vmax:         386.50 cm/s MR Vmean:        320.0 cm/s MR PISA:         1.57 cm MR PISA Eff ROA: 13 mm MR PISA Radius:  0.50 cm  Carolan Clines Electronically signed by Carolan Clines Signature Date/Time: 09/12/2023/1:55:51 PM    Final   MONITORS  CARDIAC EVENT MONITOR 06/09/2020  Narrative  30 day event monitor  Min HR 54, Max HR 140, Avg HR 70  No symptoms reported  Telemetry tracings show sinus rhythm, rare PVCs and PACs  No significant arrhythmias           Assessment & Plan   Acute on Chronic Combined Heart failure (biventricular) - ischemic cardiomyopathy, NYHA I, Stage C/D (CI 2.23) - on SGLT2i, MRA, Coreg; BP precludes additional ACEi/ARNI/ARB - lasix 20 mg as a PRN; presently euvolemic - needs close HF f/u  NSTEMI - proximal LAD stenosis after D1 and S1 (99%) with TIMI 2 flow down the distal LAD. Suspect 80% heavily calcified ostial RCA stenosis.  - s/p PCI w DES x2 to LAD and RCA.  - Triple  therapy for one month then DOAC and CLOPIDOGREL- we have discussed the risk of stent thrombosis should he elect to not take plavix. - atorvastatin 80 mg  Atrial fibrillation: Persistent. - complicated but GI bleed from erosive gastritis prior to admission, prior stroke; also has a hx of PE He is back on Eliquis after recent GI bleeding from erosive gastritis.  - s/p Amio low on amiodarone 200 mg PO daily - DOAC as above - Continue amiodarone 200 mg daily - Watchman would not decrease he AC need given his PE history - high risk on triple therapy; on pantoprazole 40 mg PO BID; consider de-escalation to daily after triple therapy ends  Hx of CVA - AC and statin as above  Hx of PE - on AC  AS - I suspect Mild AS after review of 2021 CTPE and last echo  Mild MR with at least mild mitral stenosis - medical management; will tolerate relative bradycardia better that relative tachycardia  PAD:  - H/o iliac stents and mesenteric artery bypass. Plavix and statin as above  Carotid stenosis - plavix and statin as a above  AKI on CKD - Improve to baseline  IDA - continue PO iron  Gout - no change to therapy  Malnutrition -ensure, he has been offered inpatient rehab -resume B6 and folix acid  Full Code  He would like to go to rehab.  PT/OT evaluation stated he was a good candidate.  I have reached out to that team today.  He has asked that I not call his family to update  them, as I would be waking them up.      For questions or updates, please contact [facility] Please consult [email or web address] for contact info under Cardiology/STEMI.      Riley Lam, MD FASE [facility] Cardiologist [facility]  [address] [place], [address] [place], [place] [address] [phone]  6:38 AM

## 2023-09-18 NOTE — Plan of Care (Signed)
  Problem: Education: Goal: Understanding of CV disease, CV risk reduction, and recovery process will improve Outcome: Progressing Goal: Individualized Educational Video(s) Outcome: Progressing   Problem: Activity: Goal: Ability to return to baseline activity level will improve Outcome: Progressing   Problem: Cardiovascular: Goal: Ability to achieve and maintain adequate cardiovascular perfusion will improve Outcome: Progressing Goal: Vascular access site(s) Level 0-1 will be maintained Outcome: Progressing   Problem: Health Behavior/Discharge Planning: Goal: Ability to safely manage health-related needs after discharge will improve Outcome: Progressing   Problem: Education: Goal: Knowledge of General Education information will improve Description: Including pain rating scale, medication(s)/side effects and non-pharmacologic comfort measures Outcome: Progressing   Problem: Health Behavior/Discharge Planning: Goal: Ability to manage health-related needs will improve Outcome: Progressing   Problem: Clinical Measurements: Goal: Ability to maintain clinical measurements within normal limits will improve Outcome: Progressing Goal: Will remain free from infection Outcome: Progressing Goal: Diagnostic test results will improve Outcome: Progressing Goal: Respiratory complications will improve Outcome: Progressing Goal: Cardiovascular complication will be avoided Outcome: Progressing   Problem: Activity: Goal: Risk for activity intolerance will decrease Outcome: Progressing   Problem: Nutrition: Goal: Adequate nutrition will be maintained Outcome: Progressing   Problem: Coping: Goal: Level of anxiety will decrease Outcome: Progressing   Problem: Elimination: Goal: Will not experience complications related to bowel motility Outcome: Progressing Goal: Will not experience complications related to urinary retention Outcome: Progressing   Problem: Pain Management: Goal:  General experience of comfort will improve Outcome: Progressing   Problem: Safety: Goal: Ability to remain free from injury will improve Outcome: Progressing   Problem: Skin Integrity: Goal: Risk for impaired skin integrity will decrease Outcome: Progressing   Problem: Education: Goal: Ability to demonstrate management of disease process will improve Outcome: Progressing Goal: Ability to verbalize understanding of medication therapies will improve Outcome: Progressing Goal: Individualized Educational Video(s) Outcome: Progressing   Problem: Activity: Goal: Capacity to carry out activities will improve Outcome: Progressing   Problem: Cardiac: Goal: Ability to achieve and maintain adequate cardiopulmonary perfusion will improve Outcome: Progressing

## 2023-09-19 DIAGNOSIS — I509 Heart failure, unspecified: Secondary | ICD-10-CM | POA: Diagnosis not present

## 2023-09-19 DIAGNOSIS — R5381 Other malaise: Secondary | ICD-10-CM

## 2023-09-19 DIAGNOSIS — I5023 Acute on chronic systolic (congestive) heart failure: Secondary | ICD-10-CM | POA: Diagnosis not present

## 2023-09-19 LAB — BASIC METABOLIC PANEL
Anion gap: 11 (ref 5–15)
BUN: 22 mg/dL (ref 8–23)
CO2: 20 mmol/L — ABNORMAL LOW (ref 22–32)
Calcium: 9 mg/dL (ref 8.9–10.3)
Chloride: 104 mmol/L (ref 98–111)
Creatinine, Ser: 1.75 mg/dL — ABNORMAL HIGH (ref 0.61–1.24)
GFR, Estimated: 39 mL/min — ABNORMAL LOW (ref >60)
Glucose, Bld: 201 mg/dL — ABNORMAL HIGH (ref 70–99)
Potassium: 3.7 mmol/L (ref 3.5–5.1)
Sodium: 135 mmol/L (ref 135–145)

## 2023-09-19 MED ORDER — POTASSIUM CHLORIDE CRYS ER 20 MEQ PO TBCR
40.0000 meq | EXTENDED_RELEASE_TABLET | Freq: Once | ORAL | Status: AC
  Administered 2023-09-19: 40 meq via ORAL
  Filled 2023-09-19: qty 2

## 2023-09-19 NOTE — Consult Note (Signed)
Physical Medicine and Rehabilitation Consult Reason for Consult:debility Referring Physician: Shirlee Latch   HPI: Connor Bryant is a 77 y.o. male with a history of CVA, PAD, atrial fibrillation, systolic congestive heart failure who was admitted to [facility] in late November with anemia and hypotension.  Over the last few weeks the patient was developing increased dyspnea with drop in ejection fraction and was admitted to [facility] after left and right heart catheterization which demonstrated a proximal LAD to mid LAD lesion 99% stenosed and ostial RCA lesion 80% stenosed, moderate aortic stenosis.  Patient was diuresed and underwent angioplasty and stenting of the LAD and RCA on 09/16/2023.  Patient continues to feel very weak and deconditioned despite optimal medical management by the heart failure team.  Patient ambulated with the cardiac rehab today and patient was able to walk 68 feet but with shortness of breath and dizziness during ambulation.  He worked with occupational therapy 2 days ago and was min assist for basic self-care activities.  Patient lives at home in a 1 level house with level entry by himself.  He does have some friends who can check in on him intermittently.   Review of Systems  Constitutional:  Positive for malaise/fatigue.  HENT: Negative.    Eyes:  Positive for blurred vision.  Respiratory:  Positive for cough and shortness of breath.   Cardiovascular: Negative.   Gastrointestinal:  Positive for blood in stool and melena.  Genitourinary:  Negative for dysuria.  Musculoskeletal:  Negative for myalgias.  Skin:        bruising  Neurological:  Positive for dizziness, sensory change and weakness.  Psychiatric/Behavioral:  Negative for depression.    Past Medical History:  Diagnosis Date   Acute CVA (cerebrovascular accident) (HCC) 05/01/2020   Acute respiratory failure with hypoxia (HCC) 05/04/2020   Arthritis    DM type 2 (diabetes mellitus, type 2)  (HCC)    no meds   Endotracheally intubated    Gallstone    Heart murmur    was told one time he had a murmur, not since 30 years ago   HTN (hypertension)    Hyperlipidemia    Personal history of colonic polyp-adenoma 11/24/2013   Renal insufficiency    mild   Thoracic spine fracture [facility])    Past Surgical History:  Procedure Laterality Date   ABDOMINAL AORTAGRAM N/A 02/23/2014   Procedure: ABDOMINAL Ronny Flurry;  Surgeon: Fransisco Hertz, MD;  Location: [facility] CATH LAB;  Service: Cardiovascular;  Laterality: N/A;   BIOPSY  08/20/2023   Procedure: BIOPSY;  Surgeon: Imogene Burn, MD;  Location: [facility] ENDOSCOPY;  Service: Gastroenterology;;   CATARACT EXTRACTION, BILATERAL     COLONOSCOPY     CORONARY ATHERECTOMY N/A 09/16/2023   Procedure: CORONARY ATHERECTOMY;  Surgeon: Swaziland, Peter M, MD;  Location: [facility] INVASIVE CV LAB;  Service: Cardiovascular;  Laterality: N/A;   CORONARY LITHOTRIPSY N/A 09/16/2023   Procedure: CORONARY LITHOTRIPSY;  Surgeon: Swaziland, Peter M, MD;  Location: [facility] INVASIVE CV LAB;  Service: Cardiovascular;  Laterality: N/A;   CORONARY STENT INTERVENTION N/A 09/16/2023   Procedure: CORONARY STENT INTERVENTION;  Surgeon: Swaziland, Peter M, MD;  Location: [facility] INVASIVE CV LAB;  Service: Cardiovascular;  Laterality: N/A;   CORONARY ULTRASOUND/IVUS N/A 09/16/2023   Procedure: Coronary Ultrasound/IVUS;  Surgeon: Swaziland, Peter M, MD;  Location: [facility] INVASIVE CV LAB;  Service: Cardiovascular;  Laterality: N/A;   DEBRIDEMENT TENNIS ELBOW Right    ESOPHAGOGASTRODUODENOSCOPY  ESOPHAGOGASTRODUODENOSCOPY (EGD) WITH PROPOFOL N/A 08/20/2023   Procedure: ESOPHAGOGASTRODUODENOSCOPY (EGD) WITH PROPOFOL;  Surgeon: Imogene Burn, MD;  Location: [facility] ENDOSCOPY;  Service: Gastroenterology;  Laterality: N/A;   MESENTERIC ARTERY BYPASS N/A 03/10/2014   Procedure: AORTO TO SUPERIOR MESENTERIC ARTERY BYPASS;  Surgeon: Pryor Ochoa, MD;  Location: [facility] OR;  Service: Vascular;  Laterality: N/A;   RIGHT/LEFT  HEART CATH AND CORONARY ANGIOGRAPHY N/A 09/11/2023   Procedure: RIGHT/LEFT HEART CATH AND CORONARY ANGIOGRAPHY;  Surgeon: Laurey Morale, MD;  Location: [facility] INVASIVE CV LAB;  Service: Cardiovascular;  Laterality: N/A;   TONSILLECTOMY AND ADENOIDECTOMY     vascualr surgery     illiac stents   Family History  Problem Relation Age of Onset   Diabetes Mother    Heart disease Mother        before age 75   Hyperlipidemia Mother    Hypertension Mother    AAA (abdominal aortic aneurysm) Mother    Stroke Father    Hypertension Father    Coronary artery disease Father    Hyperlipidemia Father    Diabetes Father    Bladder Cancer Father    Cancer Father    Heart disease Father    Heart attack Father    Colon cancer Neg Hx    Throat cancer Neg Hx    Kidney disease Neg Hx    Liver disease Neg Hx    Social History:  reports that he quit smoking about 14 years ago. His smoking use included cigarettes. He has been exposed to tobacco smoke. He has never used smokeless tobacco. He reports that he does not currently use alcohol. He reports that he does not currently use drugs. Allergies: No Known Allergies Medications Prior to Admission  Medication Sig Dispense Refill   allopurinol (ZYLOPRIM) 100 MG tablet TAKE 1 TABLET 2 TO 3 TIMES A DAY FOR GOUT 90 tablet 0   amiodarone (PACERONE) 200 MG tablet Take 2 tablets (400 mg total) by mouth 2 (two) times daily for 12 days, THEN 1 tablet (200 mg total) daily. (Patient taking differently: Take 400 mg daily for 3 days then take 200 mg daily ) 78 tablet 0   apixaban (ELIQUIS) 5 MG TABS tablet Take 1 tablet (5 mg total) by mouth 2 (two) times daily. 180 tablet 3   atorvastatin (LIPITOR) 80 MG tablet Take 1 tablet (80 mg total) by mouth daily. 90 tablet 3   Calcium Carb-Cholecalciferol (CALCIUM 500 + D PO) Take 1 tablet by mouth daily.     carvedilol (COREG) 12.5 MG tablet Take 1 tablet (12.5 mg total) by mouth 2 (two) times daily with a meal. 60 tablet 0    dapagliflozin propanediol (FARXIGA) 10 MG TABS tablet Take 1 tablet (10 mg total) by mouth daily before breakfast. 30 tablet 11   Ferrous Sulfate (IRON PO) Take 1 tablet by mouth daily.     folic acid (FOLVITE) 1 MG tablet Take 1 tablet (1 mg total) by mouth daily. 90 tablet 3   furosemide (LASIX) 20 MG tablet Take 1 tablet (20 mg total) by mouth daily. 30 tablet 11   glimepiride (AMARYL) 2 MG tablet TAKE ONE TABLET ONCE DAILY EVERY MORNING (Patient taking differently: Take 2 mg by mouth daily with breakfast.) 90 tablet 3   polyethylene glycol (MIRALAX / GLYCOLAX) packet Take 17 g by mouth daily. (Patient taking differently: Take 17 g by mouth daily as needed for moderate constipation.) 30 packet 1   predniSONE (DELTASONE) 10 MG  tablet Take 10 mg by mouth daily as needed (arthritis flare).     pyridOXINE (B-6) 50 MG tablet Take 1 tablet (50 mg total) by mouth daily. 90 tablet 1   spironolactone (ALDACTONE) 25 MG tablet Take 0.5 tablets (12.5 mg total) by mouth daily. 45 tablet 3   Blood Glucose Monitoring Suppl DEVI 1 each by Does not apply route in the morning, at noon, and at bedtime. May substitute to any manufacturer covered by patient's insurance. 1 each 0   docusate sodium (COLACE) 100 MG capsule Take 100 mg by mouth daily as needed for mild constipation.     pantoprazole (PROTONIX) 40 MG tablet Take 1 tablet (40 mg total) by mouth 2 (two) times daily. 60 tablet 1    Home: Home Living Family/patient expects to be discharged to:: Private residence Living Arrangements: Alone Available Help at Discharge: Family, Friend(s), Available PRN/intermittently Type of Home: House Home Access: Level entry Home Layout: One level Bathroom Shower/Tub: Health visitor: Standard Bathroom Accessibility:  (can't access with w/c but can with RW) Home Equipment: Agricultural consultant (2 wheels), [employer] - single point, BSC/3in1, Adaptive equipment Adaptive Equipment: Reacher, Sock aid, Long-handled  shoe horn Additional Comments: wife passed away in 08-Mar-2023; does not have a shower seat since he reports his shower is too small for one.  Functional History: Prior Function Prior Level of Function : Needs assist Mobility Comments: for the last 6 weeks pt has been using a w/c in the house and [facility], hasn't been driving to grocery store, son brings dinner to him and does grocery shopping ADLs Comments: sponge bathing, fixes cereal for breakfast, doesn't eat lunch, and son brings dinner. has sock aide but does not know how to use per reportn Functional Status:  Mobility: Bed Mobility Overal bed mobility: Modified Independent Bed Mobility: Supine to Sit, Sit to Supine Supine to sit: Supervision Sit to supine: Supervision General bed mobility comments: increased time and effort Transfers Overall transfer level: Needs assistance Equipment used: Rolling walker (2 wheels) Transfers: Sit to/from Stand Sit to Stand: Contact guard assist General transfer comment: verbal cues to push up from bed. CGA for safety; mild sway noted but pt denied dizziness Ambulation/Gait Ambulation/Gait assistance: Min assist Gait Distance (Feet): 70 Feet Assistive device: Rolling walker (2 wheels) Gait Pattern/deviations: Step-through pattern, Decreased stride length General Gait Details: runs into objects on the L due to impaired peripheral vision, SpO2 dec to 83% on RA, pt with report of mild dizziness and SOB s/p 40 ', after seated rest break, pt placed on 2LO2 via [place], pt dropped to 87% but didn't report SOB until about 60' Gait velocity: dec Gait velocity interpretation: <1.31 ft/sec, indicative of household ambulator    ADL: ADL Overall ADL's : Needs assistance/impaired Eating/Feeding: Sitting, Modified independent Grooming: Contact guard assist, Standing Upper Body Bathing: Set up, Sitting Lower Body Bathing: Contact guard assist, Minimal assistance, Sit to/from stand Upper Body Dressing : Set up,  Sitting Lower Body Dressing: Contact guard assist, Minimal assistance Toilet Transfer: Minimal assistance Toilet Transfer Details (indicate cue type and reason): intermittent min A; pt with signf9iciantly decr respiratory status and desatting ton 88 during very short distance mobility Functional mobility during ADLs: Contact guard assist, Rolling walker (2 wheels), Minimal assistance General ADL Comments: significant deconditioning and DOE  Cognition: Cognition Overall Cognitive Status: Within Functional Limits for tasks assessed Orientation Level: Oriented X4 Cognition Arousal: Alert Behavior During Therapy: Flat affect Overall Cognitive Status: Within Functional Limits for tasks  assessed General Comments: pt aware of deficits including loss of peripheral vision in L eye from recent CVA, flat affect/depressed spirits due to not being safe at home by himself. Following up to 2 step commands.  Blood pressure 108/60, pulse 67, temperature 97.7 F (36.5 C), temperature source Oral, resp. rate 17, height 5\' 8"  (1.727 m), weight 79.6 kg, SpO2 96%. Physical Exam Constitutional:      General: He is not in acute distress. HENT:     Head: Normocephalic.     Right Ear: External ear normal.     Left Ear: External ear normal.     Nose: Nose normal.     Mouth/Throat:     Mouth: Mucous membranes are moist.  Eyes:     Pupils: Pupils are equal, round, and reactive to light.  Cardiovascular:     Rate and Rhythm: Normal rate and regular rhythm.  Pulmonary:     Effort: Pulmonary effort is normal.  Abdominal:     General: There is no distension.  Musculoskeletal:     Cervical back: Normal range of motion.  Skin:    General: Skin is warm.     Findings: Bruising present.  Neurological:     Mental Status: He is alert.     Comments: Alert and oriented x 3. Normal insight and awareness. Intact Memory. Normal language and speech. Cranial nerve exam unremarkable except for loss of vision/ left  nearly full homonymous hemianopsia. MMT: UE 4/5 prox to distal. LE 3+HF, 4KE and 4+ ADF,PF. Slight loss of PP/LT on soles of feet.    Psychiatric:        Mood and Affect: Mood normal.        Behavior: Behavior normal.     No results found for this or any previous visit (from the past 24 hours). No results found.  Assessment/Plan: Diagnosis: 76 year old male with significant deconditioning due to acute on chronic systolic congestive heart failure with recent angioplasty and stenting. Pt with baseline near full, left homonymous hemianopsia due to prior CVA Does the need for close, 24 hr/day medical supervision in concert with the patient's rehab needs make it unreasonable for this patient to be served in a less intensive setting? Yes Co-Morbidities requiring supervision/potential complications:  -Congestive heart failure/CAD/aortic stenosis--ongoing medical management -Persistent atrial fibrillation on Eliquis -Recent GI bleed due to erosive gastritis, patient had been off Eliquis due to this -Peripheral artery disease with a history of bypass and stenting   -History of CVA, carotid stenosis left greater than right -Chronic kidney disease stage III Due to bladder management, bowel management, safety, skin/wound care, disease management, medication administration, pain management, and patient education, does the patient require 24 hr/day rehab nursing? Yes Does the patient require coordinated care of a physician, rehab nurse, therapy disciplines of PT, OT to address physical and functional deficits in the context of the above medical diagnosis(es)? Yes Addressing deficits in the following areas: balance, endurance, locomotion, strength, transferring, bowel/bladder control, bathing, dressing, feeding, grooming, toileting, and psychosocial support Can the patient actively participate in an intensive therapy program of at least 3 hrs of therapy per day at least 5 days per week? Yes The potential  for patient to make measurable gains while on inpatient rehab is excellent Anticipated functional outcomes upon discharge from inpatient rehab are modified independent  with PT, modified independent with OT, n/a with SLP. Estimated rehab length of stay to reach the above functional goals is: 7-10 days Anticipated discharge destination: Home Overall Rehab/Functional Prognosis:  excellent  POST ACUTE RECOMMENDATIONS: This patient's condition is appropriate for continued rehabilitative care in the following setting: CIR Patient has agreed to participate in recommended program. Yes Note that insurance prior authorization may be required for reimbursement for recommended care.  Comment: Pt lives alone. Still very deconditioned, SOB with basic activity. Was independent prior to admit. Rehab Admissions Coordinator to follow up.     I have personally performed a face to face diagnostic evaluation of this patient. Additionally, I have examined the patient's medical record including any pertinent labs and radiographic images. If the physician assistant has documented in this note, I have reviewed and edited or otherwise concur with the physician assistant's documentation.  Thanks,  Ranelle Oyster, MD 09/19/2023 t

## 2023-09-19 NOTE — Plan of Care (Signed)
  Problem: Cardiovascular: Goal: Vascular access site(s) Level 0-1 will be maintained Outcome: Progressing   Problem: Education: Goal: Knowledge of General Education information will improve Description: Including pain rating scale, medication(s)/side effects and non-pharmacologic comfort measures Outcome: Progressing   Problem: Clinical Measurements: Goal: Will remain free from infection Outcome: Progressing Goal: Diagnostic test results will improve Outcome: Progressing   Problem: Activity: Goal: Risk for activity intolerance will decrease Outcome: Progressing   Problem: Nutrition: Goal: Adequate nutrition will be maintained Outcome: Progressing   Problem: Pain Management: Goal: General experience of comfort will improve Outcome: Progressing   Problem: Safety: Goal: Ability to remain free from injury will improve Outcome: Progressing   Problem: Skin Integrity: Goal: Risk for impaired skin integrity will decrease Outcome: Progressing

## 2023-09-19 NOTE — NC FL2 (Signed)
[place] MEDICAID FL2 LEVEL OF CARE FORM     IDENTIFICATION  Patient Name: Connor Bryant Birthdate: 22-Nov-1945 Sex: male Admission Date (Current Location): 09/11/2023  [facility] and [place] Number:  Producer, television/film/video and Address:  [facility] [place], [address] [place], [place] [address]      Provider Number: [id number]  Attending Physician Name and Address:  Laurey Morale, MD  Relative Name and Phone Number:  [place], Southern   [phone]    Current Level of Care: Hospital Recommended Level of Care: Skilled Nursing Facility Prior Approval Number:    Date Approved/Denied:   PASRR Number: [id number]  Discharge Plan: SNF    Current Diagnoses: Patient Active Problem List   Diagnosis Date Noted   Coronary artery disease involving native coronary artery of native heart without angina pectoris 09/15/2023   Chronic systolic CHF (congestive heart failure) (HCC) 09/14/2023   CHF (congestive heart failure) (HCC) 09/11/2023   HFrEF (heart failure with reduced ejection fraction) (HCC) 08/21/2023   Acute on chronic anemia 08/18/2023   Symptomatic anemia 08/18/2023   On apixaban therapy 08/18/2023   Paroxysmal atrial fibrillation (HCC) 08/18/2023   GI bleed 08/17/2023   GIB (gastrointestinal bleeding) 08/17/2023   Hypotension 08/17/2023   AKI (acute kidney injury) (HCC) 08/17/2023   Respiratory distress 08/17/2023   Rheumatoid arthritis involving multiple sites with positive rheumatoid factor (HCC) 06/25/2023   Diabetes mellitus treated with oral medication (HCC) 06/03/2023   Non-ST elevation (NSTEMI) myocardial infarction [facility])    Acute systolic heart failure (HCC)    Elevated troponin 05/03/2020   Fever 05/03/2020   Positive D dimer 05/03/2020   PAF (paroxysmal atrial fibrillation) (HCC) 05/02/2020   S/P insertion of iliac artery stent 05/01/2020   Diabetic lipidosis (HCC) 03/10/2020   Intermittent claudication (HCC) 03/10/2020   Compression  fracture of T12 vertebra (HCC) 10/27/2018   Spinal stenosis of lumbosacral region 10/27/2018   Small bowel obstruction (HCC) 09/30/2018   Back pain of lumbar region with sciatica 08/26/2018   Chronic left-sided low back pain with left-sided sciatica 08/26/2018   Tremor 08/26/2018   Pure hypercholesterolemia 02/25/2018   Atherosclerosis of aorta (HCC) 02/24/2018   Bilateral hip pain 06/24/2017   Body mass index 30.0-30.9, adult 06/06/2016   Gout 06/06/2016   Essential hypertension 06/06/2016   Chronic mesenteric ischemia (HCC) 03/01/2014   Peripheral vascular disease (HCC) 02/16/2014   Superior mesenteric artery stenosis (HCC) 02/16/2014   History of colonic polyps 11/24/2013    Orientation RESPIRATION BLADDER Height & Weight     Self, Time, Situation, Place  Normal Incontinent Weight: 175 lb 7.8 oz (79.6 kg) Height:  5\' 8"  (172.7 cm)  BEHAVIORAL SYMPTOMS/MOOD NEUROLOGICAL BOWEL NUTRITION STATUS      Continent Diet (see dc summary)  AMBULATORY STATUS COMMUNICATION OF NEEDS Skin   Limited Assist Verbally Normal                       Personal Care Assistance Level of Assistance  Bathing, Feeding, Dressing, Total care Bathing Assistance: Limited assistance Feeding assistance: Limited assistance Dressing Assistance: Limited assistance Total Care Assistance: Limited assistance   Functional Limitations Info  Sight, Hearing, Speech Sight Info: Impaired (wears glasses) Hearing Info: Adequate Speech Info: Adequate    SPECIAL CARE FACTORS FREQUENCY  PT (By licensed PT), OT (By licensed OT)     PT Frequency: 5x weekly OT Frequency: 5x weekly  Contractures      Additional Factors Info  Code Status, Allergies Code Status Info: Full Allergies Info: No known allergies           Current Medications (09/19/2023):  This is the current hospital active medication list Current Facility-Administered Medications  Medication Dose Route Frequency Provider Last  Rate Last Admin   acetaminophen (TYLENOL) tablet 650 mg  650 mg Oral Q4H PRN Sw[place], Peter M, MD       allopurinol (ZYLOPRIM) tablet 100 mg  100 mg Oral BID [place], Peter M, MD   100 mg at 09/19/23 2951   amiodarone (PACERONE) tablet 200 mg  200 mg Oral Daily [place], Peter M, MD   200 mg at 09/19/23 8841   apixaban (ELIQUIS) tablet 5 mg  5 mg Oral BID [place], Peter M, MD   5 mg at 09/19/23 6606   atorvastatin (LIPITOR) tablet 80 mg  80 mg Oral Daily [place], Peter M, MD   80 mg at 09/19/23 0859   carvedilol (COREG) tablet 12.5 mg  12.5 mg Oral BID WC [place], Peter M, MD   12.5 mg at 09/19/23 3016   clopidogrel (PLAVIX) tablet 75 mg  75 mg Oral Q breakfast [place], Peter M, MD   75 mg at 09/19/23 0109   dapagliflozin propanediol (FARXIGA) tablet 10 mg  10 mg Oral Daily Lee, [place], NP   10 mg at 09/19/23 0859   docusate sodium (COLACE) capsule 100 mg  100 mg Oral Daily PRN [place], Peter M, MD       feeding supplement (ENSURE ENLIVE / ENSURE PLUS) liquid 237 mL  237 mL Oral BID BM [place], Peter M, MD   237 mL at 09/19/23 3235   ferrous sulfate tablet 325 mg  325 mg Oral Daily [place], Peter M, MD   325 mg at 09/19/23 5732   folic acid (FOLVITE) tablet 1 mg  1 mg Oral Daily [place], Peter M, MD   1 mg at 09/19/23 2025   Living Better with Heart Failure Book   Does not apply Once [place], Peter M, MD       ondansetron [facility]) injection 4 mg  4 mg Intravenous Q6H PRN [place], Peter M, MD       pantoprazole (PROTONIX) EC tablet 40 mg  40 mg Oral BID [place], Peter M, MD   40 mg at 09/19/23 0859   polyethylene glycol (MIRALAX / GLYCOLAX) packet 17 g  17 g Oral Daily [place], Peter M, MD       pyridOXINE (VITAMIN B6) tablet 50 mg  50 mg Oral Daily Chandrasekhar, Mahesh A, MD   50 mg at 09/19/23 0859   sodium chloride flush (NS) 0.9 % injection 3 mL  3 mL Intravenous Q12H [place], Peter M, MD   3 mL at 09/19/23 0901   sodium chloride flush (NS) 0.9 % injection 3 mL  3 mL Intravenous PRN [place], Peter M, MD        sodium chloride flush (NS) 0.9 % injection 3 mL  3 mL Intravenous Q12H [place], Peter M, MD   3 mL at 09/19/23 0901   sodium chloride flush (NS) 0.9 % injection 3 mL  3 mL Intravenous PRN [place], Peter M, MD       spironolactone (ALDACTONE) tablet 12.5 mg  12.5 mg Oral Daily Lee, [place], NP   12.5 mg at 09/19/23 4270     Discharge Medications: Please see discharge summary for a list of discharge medications.  Relevant Imaging Results:  Relevant Lab Results:   Additional Information SS: [id number]  Reva Bores, LCSWA

## 2023-09-19 NOTE — TOC Progression Note (Signed)
Transition of Care [facility]) - Progression Note    Patient Details  Name: Connor Bryant MRN: [id number] Date of Birth: [birth year]  Transition of Care [facility]) CM/SW Contact  Nicanor Bake Phone Number: [phone] 09/19/2023, 10:33 AM  Clinical Narrative:  HF CSW completed pts FL2 and faxed out to different SNFs. Offers are currently pending. CIR is currently pending. According to MD, pt would prefer to got to CIR over SNF. If pt has to go to SNF he prefers [facility].   TOC will continue following.      Expected Discharge Plan: Home w Home Health Services Barriers to Discharge: Continued Medical Work up  Expected Discharge Plan and Services   Discharge Planning Services: CM Consult Post Acute Care Choice: Home Health Living arrangements for the past 2 months: Single Family Home                           HH Arranged: PT, RN [facility] Agency: [facility] Date [facility] Agency Contacted: 09/12/23 Time HH Agency Contacted: 1445 Representative spoke with at [facility] Agency: Hassel Neth   Social Determinants of Health (SDOH) Interventions SDOH Screenings   Food Insecurity: No Food Insecurity (09/11/2023)  Housing: Low Risk  (09/11/2023)  Transportation Needs: No Transportation Needs (09/11/2023)  Utilities: Not At Risk (09/11/2023)  Alcohol Screen: Low Risk  (08/19/2023)  Depression (PHQ2-9): Low Risk  (08/27/2023)  Financial Resource Strain: Low Risk  (08/19/2023)  Physical Activity: Insufficiently Active (12/03/2022)  Social Connections: Moderately Isolated (12/03/2022)  Stress: No Stress Concern Present (12/03/2022)  Tobacco Use: Medium Risk (09/12/2023)    Readmission Risk Interventions     No data to display

## 2023-09-19 NOTE — Progress Notes (Signed)
Mobility Specialist Progress Note;   09/19/23 1100  Mobility  Activity Ambulated with assistance in hallway  Level of Assistance Contact guard assist, steadying assist  Assistive Device Front wheel walker  Distance Ambulated (ft) 75 ft  Activity Response Tolerated well  Mobility Referral Yes  Mobility visit 1 Mobility  Mobility Specialist Start Time (ACUTE ONLY) 1100  Mobility Specialist Stop Time (ACUTE ONLY) 1120  Mobility Specialist Time Calculation (min) (ACUTE ONLY) 20 min   Pt agreeable to mobility. Required MinG assistance during ambulation for safety. VSS throughout and no c/o. Pt returned back to bed with all needs met.   Connor Bryant Mobility Specialist Please contact via SecureChat or [employer] [phone]

## 2023-09-19 NOTE — Progress Notes (Addendum)
Advanced Heart Failure Rounding Note  Cardiologist: Dina Rich, MD  Chief Complaint: Acute on Chronic Systolic Heart Failure  Subjective:    12/23: PCI w DES x2 to LAD and RCA.   Denies CP. No dyspnea. Still feels weak w/ ambulation. No other complaints.   BMP pending.   Objective:    Weight Range: 79.6 kg Body mass index is 26.68 kg/m.   Vital Signs:   Temp:  [97.6 F (36.4 C)-99.3 F (37.4 C)] 97.7 F (36.5 C) (12/26 0808) Pulse Rate:  [60-67] 67 (12/26 0808) Resp:  [15-20] 17 (12/26 0808) BP: (106-122)/(53-61) 108/60 (12/26 0808) SpO2:  [94 %-97 %] 96 % (12/26 0808) Weight:  [79.6 kg] 79.6 kg (12/26 0500) Last BM Date : 09/18/23  Weight change: Filed Weights   09/17/23 0546 09/18/23 0553 09/19/23 0500  Weight: 80.1 kg 79.4 kg 79.6 kg   Intake/Output:   Intake/Output Summary (Last 24 hours) at 09/19/2023 0859 Last data filed at 09/19/2023 0324 Gross per 24 hour  Intake 240 ml  Output 1400 ml  Net -1160 ml   Physical Exam    General:  Well appearing. No respiratory difficulty HEENT: normal Neck: supple. JVD 7 cm. Carotids 2+ bilat; no bruits. No lymphadenopathy or thyromegaly appreciated. Cor: PMI nondisplaced. Regular rate & rhythm. No rubs, gallops or murmurs. Lungs: clear Abdomen: soft, nontender, nondistended. No hepatosplenomegaly. No bruits or masses. Good bowel sounds. Extremities: no cyanosis, clubbing, rash, edema Neuro: alert & oriented x 3, cranial nerves grossly intact. moves all 4 extremities w/o difficulty. Affect pleasant.   Telemetry   SR in 60s (personally reviewed)  EKG    No new EKG to review  Labs    CBC Recent Labs    09/17/23 0215  WBC 6.0  HGB 10.3*  HCT 31.4*  MCV 95.2  PLT 168   Basic Metabolic Panel Recent Labs    16/10/96 0215  NA 135  K 4.1  CL 103  CO2 22  GLUCOSE 113*  BUN 18  CREATININE 1.83*  CALCIUM 8.8*   Liver Function Tests No results for input(s): "AST", "ALT", "ALKPHOS",  "BILITOT", "PROT", "ALBUMIN" in the last 72 hours.  No results for input(s): "LIPASE", "AMYLASE" in the last 72 hours. Cardiac Enzymes No results for input(s): "CKTOTAL", "CKMB", "CKMBINDEX", "TROPONINI" in the last 72 hours.  BNP: BNP (last 3 results) Recent Labs    08/19/23 0213 09/03/23 1621 09/11/23 1637  BNP 1,228.1* 1,602.8* 1,478.1*   ProBNP (last 3 results) No results for input(s): "PROBNP" in the last 8760 hours.  D-Dimer No results for input(s): "DDIMER" in the last 72 hours. Hemoglobin A1C No results for input(s): "HGBA1C" in the last 72 hours. Fasting Lipid Panel No results for input(s): "CHOL", "HDL", "LDLCALC", "TRIG", "CHOLHDL", "LDLDIRECT" in the last 72 hours. Thyroid Function Tests No results for input(s): "TSH", "T4TOTAL", "T3FREE", "THYROIDAB" in the last 72 hours.  Invalid input(s): "FREET3"  Other results:  Imaging   No results found.   Medications:    Scheduled Medications:  allopurinol  100 mg Oral BID   amiodarone  200 mg Oral Daily   apixaban  5 mg Oral BID   atorvastatin  80 mg Oral Daily   carvedilol  12.5 mg Oral BID WC   clopidogrel  75 mg Oral Q breakfast   dapagliflozin propanediol  10 mg Oral Daily   feeding supplement  237 mL Oral BID BM   ferrous sulfate  325 mg Oral Daily   folic acid  1 mg Oral Daily   Living Better with Heart Failure Book   Does not apply Once   pantoprazole  40 mg Oral BID   polyethylene glycol  17 g Oral Daily   pyridOXINE  50 mg Oral Daily   sodium chloride flush  3 mL Intravenous Q12H   sodium chloride flush  3 mL Intravenous Q12H   spironolactone  12.5 mg Oral Daily    Infusions:    PRN Medications: acetaminophen, docusate sodium, ondansetron (ZOFRAN) IV, sodium chloride flush, sodium chloride flush  Patient Profile   Connor Bryant is a 77 y.o. male with past medical history of CVA, persistent Afib, and chronic systolic heart failure.   Assessment/Plan   1. Acute on chronic systolic CHF:  Drop in EF in 8/21 to 35% with apical akinesis in setting CVA with improvement to normal EF by 10/21. This was thought to be due to stress cardiomyopathy.  Echo in 11/24 showed EF back down to EF 30-35% with diffuse hypokinesis, normal RV, PASP 60 mmHg, mild-moderate MR, moderate AS. This was in the setting of atrial fibrillation and anemia. I did not think that atrial fibrillation was the cause of this as rate was well-controlled (not tachycardia-mediated). LHC/RHC 12/18 showed critical proximal LAD and severe ostial RCA stenoses, suggesting an ischemic cardiomyopathy. Mildly elevated RA pressure, elevated PCWP with prominent v-waves, normal LVEDP. Simultaneous LV/PCWP pressures were not measured, but PCWP 29 and LVEDP 11 suggests gradient across mitral valve with significant mitral stenosis. Moderate pulmonary venous hypertension. Low but not markedly low cardiac output (CI 2.23 Fick, 2.03 thermo).  Now s/p PCA w DES x2 to LAD and RCA. - Echo 09/12/23: EF 30-35%, mod reduced RV function. Mild MR and Mod AS.  - Euvolemic on exam  - Continue Farxiga 10 mg  - On Spiro 12.5 mg daily. Check BMP, if SCr/K ok will increase to 25 mg   - Continue Coreg 12.5 mg bid.  2. CAD: Patient has not had any chest pain. He has had exertional dyspnea. Echo in 11/24 showed EF down to 30-35%. Cath 12/18 showed severe, heavily calcified proximal LAD stenosis after D1 and S1 (99%) with TIMI 2 flow down the distal LAD. Suspect 80% heavily calcified ostial RCA stenosis. Suspect ischemic cardiomyopathy.  - s/p PCI w DES x2 to LAD and RCA. Will need monotherapy antiplatelet with Plavix, (on Eliquis for Afib) - Stable w/o CP  - Continue Plavix - Continue statin  daily 3. Atrial fibrillation: Persistent. He is back on Eliquis after recent GI bleeding from erosive gastritis. He was started on amiodarone at 11/24 admission. NSR on tele.  - on Eliquis - Continue amiodarone 200 mg daily - If he has further GI bleeding, would consider  Watchman for him.  4. CVA: 8/21, suspect due to atrial fibrillation.   5. PE: In 8/21, he is on Eliquis.  6. Aortic stenosis: AS looked moderate on 11/24 echo. S2 can be heard clearly. However, aortic valve was easily crossed on cath 09/11/23 and peak-to-peak gradient was only 8 mmHg.  - Mod stenosis present on Echo 09/12/23 7. PAD: H/o iliac stents and mesenteric artery bypass. No claudication or intestinal angina. No pedal ulcerations.  - Continue statin.  8. Carotid stenosis: 40-59% L ICA and 1-39% R ICA stenosis on 09/12/23. 9. GI bleeding: 11/24 GI bleeding from erosive esophagitis. CBC has been stable with no overt bleeding.  - Avoid ETOH.  10. CKD stage 3: sCr at baseline ~1.4-1.7.  - BMP pending  -  Follow sCr closely. 11. Mitral stenosis: Mild mitral stenosis on 11/24 echo with mean gradient 5 mmHg.  On cath 09/11/23, simultaneous LV/PCWP pressures were not measured, but PCWP 29 and LVEDP 11 suggests gradient across mitral valve with significant mitral stenosis.  - no mitral stenosis noted on 12/24 echo. Mild MR. 12. Deconditioning - CIR following, full assessment pending   Length of Stay: [address] Connor Bryant  09/19/2023, 8:59 AM   Patient seen and examined with the above-signed Advanced Practice Provider and/or Housestaff. I personally reviewed laboratory data, imaging studies and relevant notes. I independently examined the patient and formulated the important aspects of the plan. I have edited the note to reflect any of my changes or salient points. I have personally discussed the plan with the patient and/or family.  Denies CP or SOB. Remains in NSR. Feels weak.   General:  Lying in bed No resp difficulty HEENT: normal Neck: supple. no JVD. Carotids 2+ bilat; no bruits. No lymphadenopathy or thryomegaly appreciated. Cor: PMI nondisplaced. Regular rate & rhythm. No rubs, gallops or murmurs. Lungs: clear Abdomen: soft, nontender, nondistended. No hepatosplenomegaly.  No bruits or masses. Good bowel sounds. Extremities: no cyanosis, clubbing, rash, edema Neuro: alert & orientedx3, cranial nerves grossly intact. moves all 4 extremities w/o difficulty. Affect pleasant  Stable for d/c from cardiac perspective. Awaiting dispo regarding CIR vs SNF.  Continue Eliquis, Plavix monotherapy and amio.   Arvilla Meres, MD  9:26 AM  Advanced Heart Failure Team Pager [phone] (M-F; 7a - 5p)  Please contact [facility] for night-coverage after hours (5p -7a ) and weekends on [email or web address]

## 2023-09-19 NOTE — TOC Progression Note (Signed)
Transition of Care [facility]) - Progression Note    Patient Details  Name: Connor Bryant MRN: [id number] Date of Birth: [birth year]  Transition of Care [facility]) CM/SW Contact  Elliot Cousin, RN Phone Number: [phone] 09/19/2023, 10:55 AM  Clinical Narrative:     HF TOC CM spoke to pt and states he wants to go to IP rehab and get stronger before going home. CIR is following and insurance Berkley Harvey is pending.  Discussed SNF rehab, states he knows about [facility] but prefers IP rehab at this time.  Gave permission to create FL2 and fax referral to SNF, if IP rehab auth not approved.   Expected Discharge Plan: IP Rehab Facility Barriers to Discharge: Continued Medical Work up  Expected Discharge Plan and Services   Discharge Planning Services: CM Consult Post Acute Care Choice: IP Rehab Living arrangements for the past 2 months: Single Family Home                           HH Arranged: PT, RN [facility] Agency: [facility] Date [facility] Agency Contacted: 09/12/23 Time HH Agency Contacted: 1445 Representative spoke with at [facility] Agency: Hassel Neth   Social Determinants of Health (SDOH) Interventions SDOH Screenings   Food Insecurity: No Food Insecurity (09/11/2023)  Housing: Low Risk  (09/11/2023)  Transportation Needs: No Transportation Needs (09/11/2023)  Utilities: Not At Risk (09/11/2023)  Alcohol Screen: Low Risk  (08/19/2023)  Depression (PHQ2-9): Low Risk  (08/27/2023)  Financial Resource Strain: Low Risk  (08/19/2023)  Physical Activity: Insufficiently Active (12/03/2022)  Social Connections: Moderately Isolated (12/03/2022)  Stress: No Stress Concern Present (12/03/2022)  Tobacco Use: Medium Risk (09/12/2023)    Readmission Risk Interventions     No data to display

## 2023-09-19 NOTE — Progress Notes (Signed)
IP rehab admissions - I met with patient this am.  He would like to come to inpatient rehab.  I will open the case with [facility] medicare this am.  Please see consult done by Dr. Riley Kill today.  Will await call back from insurance case manager.  Call me for questions.  [phone]

## 2023-09-19 NOTE — Progress Notes (Signed)
CARDIAC REHAB PHASE I   PRE:  Rate/Rhythm: 67 NSR  BP:  Sitting: 108/60      SpO2: 97 RA  MODE:  Ambulation: 68 ft    POST:  Rate/Rhythm: 77 NSR  BP:  Sitting: 151/71      SpO2: 92-96 RA  Pt ambulated with CGA using gait belt in the hallway with RW, pt reported 1/10 dizziness and mild SOB during ambulation SpO2 >92% while ambulating. Ambulation distance limited by pt declining to walk further. Pt was helped back into bed with all needs met prior to leaving.   Faustino Congress  MS, ACSM-CEP 9:00 AM 09/19/2023    Service time is from 0830 to 0900.

## 2023-09-20 DIAGNOSIS — I5023 Acute on chronic systolic (congestive) heart failure: Secondary | ICD-10-CM | POA: Diagnosis not present

## 2023-09-20 LAB — CBC
HCT: 33.3 % — ABNORMAL LOW (ref 39.0–52.0)
Hemoglobin: 11 g/dL — ABNORMAL LOW (ref 13.0–17.0)
MCH: 31.3 pg (ref 26.0–34.0)
MCHC: 33 g/dL (ref 30.0–36.0)
MCV: 94.9 fL (ref 80.0–100.0)
Platelets: 208 10*3/uL (ref 150–400)
RBC: 3.51 MIL/uL — ABNORMAL LOW (ref 4.22–5.81)
RDW: 16.8 % — ABNORMAL HIGH (ref 11.5–15.5)
WBC: 7.7 10*3/uL (ref 4.0–10.5)
nRBC: 0 % (ref 0.0–0.2)

## 2023-09-20 LAB — BASIC METABOLIC PANEL
Anion gap: 11 (ref 5–15)
BUN: 25 mg/dL — ABNORMAL HIGH (ref 8–23)
CO2: 21 mmol/L — ABNORMAL LOW (ref 22–32)
Calcium: 8.8 mg/dL — ABNORMAL LOW (ref 8.9–10.3)
Chloride: 105 mmol/L (ref 98–111)
Creatinine, Ser: 2.03 mg/dL — ABNORMAL HIGH (ref 0.61–1.24)
GFR, Estimated: 33 mL/min — ABNORMAL LOW (ref >60)
Glucose, Bld: 194 mg/dL — ABNORMAL HIGH (ref 70–99)
Potassium: 4.3 mmol/L (ref 3.5–5.1)
Sodium: 137 mmol/L (ref 135–145)

## 2023-09-20 NOTE — Progress Notes (Signed)
Physical Therapy Treatment Patient Details Name: Connor Bryant MRN: [id number] DOB: [birth year] Today's Date: 09/20/2023   History of Present Illness Pt is a 77yo male who presented with ischemic cardiomyopathy and is now s/p coronary stent. PMH: acute on chronic systolic CHF, CAD, a-fib, CVA    PT Comments  Pt with overall improvement in activity tolerance however continues with R sided weakness which worsens with increased ambulation requiring increased assist for support and walker management. Pt remains at increased falls risk and continues to benefit from inpatient rehab program >3 hours a day to achieve safe mod I level of function for safe transition home.    If plan is discharge home, recommend the following: A little help with walking and/or transfers;A little help with bathing/dressing/bathroom;Assistance with cooking/housework;Assist for transportation;Direct supervision/assist for medications management   Can travel by private vehicle        Equipment Recommendations  None recommended by PT    Recommendations for Other Services Rehab consult     Precautions / Restrictions Precautions Precautions: Fall Restrictions Weight Bearing Restrictions Per Provider Order: No     Mobility  Bed Mobility               General bed mobility comments: pt received sitting up in chair    Transfers Overall transfer level: Needs assistance Equipment used: Rolling walker (2 wheels) Transfers: Sit to/from Stand Sit to Stand: Contact guard assist           General transfer comment: verbal cues to push up from arm rests and reach back for arm rests    Ambulation/Gait Ambulation/Gait assistance: Min assist Gait Distance (Feet): 100 Feet Assistive device: Rolling walker (2 wheels) Gait Pattern/deviations: Step-through pattern, Decreased stride length Gait velocity: dec     General Gait Details: pt with progressive R lateral lean with R UE and LE weakness noted, with  onset of fatigue R knee instability noted requiring increase assist for walker management, pt denies dizziness, mild SOB however overall improved from previous session   Stairs             Wheelchair Mobility     Tilt Bed    Modified Rankin (Stroke Patients Only)       Balance Overall balance assessment: Needs assistance Sitting-balance support: Feet supported, No upper extremity supported Sitting balance-Leahy Scale: Good     Standing balance support: Bilateral upper extremity supported, Reliant on assistive device for balance Standing balance-Leahy Scale: Poor Standing balance comment: dependent on RW                            Cognition Arousal: Alert Behavior During Therapy: Flat affect Overall Cognitive Status: Within Functional Limits for tasks assessed                                 General Comments: pt aware of deficits including loss of peripheral vision in L eye from recent CVA, flat affect/depressed spirits due to not being safe at home by himself. Following up to 2 step commands. aware of R side being weaker        Exercises      General Comments General comments (skin integrity, edema, etc.): VSS      Pertinent Vitals/Pain Pain Assessment Pain Assessment: No/denies pain    Home Living  Prior Function            PT Goals (current goals can now be found in the care plan section) Acute Rehab PT Goals Patient Stated Goal: get stronger to go home PT Goal Formulation: With patient Time For Goal Achievement: 10/01/23 Potential to Achieve Goals: Good Progress towards PT goals: Progressing toward goals    Frequency    Min 1X/week      PT Plan      Co-evaluation              AM-PAC PT "6 Clicks" Mobility   Outcome Measure  Help needed turning from your back to your side while in a flat bed without using bedrails?: A Little Help needed moving from lying on your back  to sitting on the side of a flat bed without using bedrails?: A Little Help needed moving to and from a bed to a chair (including a wheelchair)?: A Little Help needed standing up from a chair using your arms (e.g., wheelchair or bedside chair)?: A Lot Help needed to walk in hospital room?: A Lot Help needed climbing 3-5 steps with a railing? : A Lot 6 Click Score: 15    End of Session Equipment Utilized During Treatment: Gait belt Activity Tolerance: Patient limited by fatigue Patient left: with call bell/phone within reach;in chair;with nursing/sitter in room Nurse Communication: Mobility status PT Visit Diagnosis: Other abnormalities of gait and mobility (R26.89);Muscle weakness (generalized) (M62.81)     Time: 7829-5621 PT Time Calculation (min) (ACUTE ONLY): 12 min  Charges:    $Gait Training: 8-22 mins PT General Charges $$ ACUTE PT VISIT: 1 Visit                     Connor Bryant, PT, DPT Acute Rehabilitation Services Secure chat preferred Office #: [phone]    Iona Hansen 09/20/2023, 2:12 PM

## 2023-09-20 NOTE — Progress Notes (Addendum)
Advanced Heart Failure Rounding Note  Cardiologist: Dina Rich, MD  Chief Complaint: Acute on Chronic Systolic Heart Failure  Subjective:    12/23: PCI w DES x2 to LAD and RCA.   Waiting in insurance approval for CIR.   Denies CP. No dyspnea. Just feels weak when he walks.  BP stable/ normotensive.   Scr up 1.8>>2.0. BUN 22>>25  Not eating much. Doesn't like the hospital food.    Objective:    Weight Range: 79.9 kg Body mass index is 26.78 kg/m.   Vital Signs:   Temp:  [97.8 F (36.6 C)-98.3 F (36.8 C)] 97.8 F (36.6 C) (12/27 0715) Pulse Rate:  [58-68] 68 (12/27 0715) Resp:  [18-22] 22 (12/27 0715) BP: (103-126)/(52-88) 103/88 (12/27 0715) SpO2:  [94 %-98 %] 97 % (12/27 0715) Weight:  [79.9 kg] 79.9 kg (12/27 0543) Last BM Date : 09/18/23  Weight change: Filed Weights   09/18/23 0553 09/19/23 0500 09/20/23 0543  Weight: 79.4 kg 79.6 kg 79.9 kg   Intake/Output:   Intake/Output Summary (Last 24 hours) at 09/20/2023 0935 Last data filed at 09/20/2023 0454 Gross per 24 hour  Intake 240 ml  Output 900 ml  Net -660 ml   Physical Exam    General:  Well appearing. No respiratory difficulty HEENT: normal Neck: supple. no JVD. Carotids 2+ bilat; no bruits. No lymphadenopathy or thyromegaly appreciated. Cor: PMI nondisplaced. Regular rate & rhythm. No rubs, gallops or murmurs. Lungs: clear Abdomen: soft, nontender, nondistended. No hepatosplenomegaly. No bruits or masses. Good bowel sounds. Extremities: no cyanosis, clubbing, rash, edema Neuro: alert & oriented x 3, cranial nerves grossly intact. moves all 4 extremities w/o difficulty. Affect pleasant.   Telemetry   NSR in 60s (personally reviewed)  EKG    No new EKG to review  Labs    CBC No results for input(s): "WBC", "NEUTROABS", "HGB", "HCT", "MCV", "PLT" in the last 72 hours.  Basic Metabolic Panel Recent Labs    40/98/11 0946 09/20/23 0215  NA 135 137  K 3.7 4.3  CL 104 105   CO2 20* 21*  GLUCOSE 201* 194*  BUN 22 25*  CREATININE 1.75* 2.03*  CALCIUM 9.0 8.8*   Liver Function Tests No results for input(s): "AST", "ALT", "ALKPHOS", "BILITOT", "PROT", "ALBUMIN" in the last 72 hours.  No results for input(s): "LIPASE", "AMYLASE" in the last 72 hours. Cardiac Enzymes No results for input(s): "CKTOTAL", "CKMB", "CKMBINDEX", "TROPONINI" in the last 72 hours.  BNP: BNP (last 3 results) Recent Labs    08/19/23 0213 09/03/23 1621 09/11/23 1637  BNP 1,228.1* 1,602.8* 1,478.1*   ProBNP (last 3 results) No results for input(s): "PROBNP" in the last 8760 hours.  D-Dimer No results for input(s): "DDIMER" in the last 72 hours. Hemoglobin A1C No results for input(s): "HGBA1C" in the last 72 hours. Fasting Lipid Panel No results for input(s): "CHOL", "HDL", "LDLCALC", "TRIG", "CHOLHDL", "LDLDIRECT" in the last 72 hours. Thyroid Function Tests No results for input(s): "TSH", "T4TOTAL", "T3FREE", "THYROIDAB" in the last 72 hours.  Invalid input(s): "FREET3"  Other results:  Imaging   No results found.   Medications:    Scheduled Medications:  allopurinol  100 mg Oral BID   amiodarone  200 mg Oral Daily   apixaban  5 mg Oral BID   atorvastatin  80 mg Oral Daily   carvedilol  12.5 mg Oral BID WC   clopidogrel  75 mg Oral Q breakfast   dapagliflozin propanediol  10 mg Oral Daily  feeding supplement  237 mL Oral BID BM   ferrous sulfate  325 mg Oral Daily   folic acid  1 mg Oral Daily   Living Better with Heart Failure Book   Does not apply Once   pantoprazole  40 mg Oral BID   polyethylene glycol  17 g Oral Daily   pyridOXINE  50 mg Oral Daily   sodium chloride flush  3 mL Intravenous Q12H   sodium chloride flush  3 mL Intravenous Q12H   spironolactone  12.5 mg Oral Daily    Infusions:    PRN Medications: acetaminophen, docusate sodium, ondansetron (ZOFRAN) IV, sodium chloride flush, sodium chloride flush  Patient Profile   Connor Bryant is a 77 y.o. male with past medical history of CVA, persistent Afib, and chronic systolic heart failure.   Assessment/Plan   1. Acute on chronic systolic CHF: Drop in EF in 8/21 to 35% with apical akinesis in setting CVA with improvement to normal EF by 10/21. This was thought to be due to stress cardiomyopathy.  Echo in 11/24 showed EF back down to EF 30-35% with diffuse hypokinesis, normal RV, PASP 60 mmHg, mild-moderate MR, moderate AS. This was in the setting of atrial fibrillation and anemia. I did not think that atrial fibrillation was the cause of this as rate was well-controlled (not tachycardia-mediated). LHC/RHC 12/18 showed critical proximal LAD and severe ostial RCA stenoses, suggesting an ischemic cardiomyopathy. Mildly elevated RA pressure, elevated PCWP with prominent v-waves, normal LVEDP. Simultaneous LV/PCWP pressures were not measured, but PCWP 29 and LVEDP 11 suggests gradient across mitral valve with significant mitral stenosis. Moderate pulmonary venous hypertension. Low but not markedly low cardiac output (CI 2.23 Fick, 2.03 thermo).  Now s/p PCA w DES x2 to LAD and RCA. - Echo 09/12/23: EF 30-35%, mod reduced RV function. Mild MR and Mod AS.  - Euvolemic on exam  - Continue Farxiga 10 mg  - Continue Spiro 12.5 mg daily. Monitor SCr closely  - Continue Coreg 12.5 mg bid.  2. CAD: Patient has not had any chest pain. He has had exertional dyspnea. Echo in 11/24 showed EF down to 30-35%. Cath 12/18 showed severe, heavily calcified proximal LAD stenosis after D1 and S1 (99%) with TIMI 2 flow down the distal LAD. Suspect 80% heavily calcified ostial RCA stenosis. Suspect ischemic cardiomyopathy.  - s/p PCI w DES x2 to LAD and RCA. Will need monotherapy antiplatelet with Plavix, (on Eliquis for Afib) - Stable w/o CP  - Continue Plavix - Continue statin daily 3. Atrial fibrillation: Persistent. He is back on Eliquis after recent GI bleeding from erosive gastritis. He was started  on amiodarone at 11/24 admission. NSR on tele.  - on Eliquis. Check CBC today  - Continue amiodarone 200 mg daily - If he has further GI bleeding, would consider Watchman for him.  4. CVA: 8/21, suspect due to atrial fibrillation.   5. PE: In 8/21, he is on Eliquis.  6. Aortic stenosis: AS looked moderate on 11/24 echo. S2 can be heard clearly. However, aortic valve was easily crossed on cath 09/11/23 and peak-to-peak gradient was only 8 mmHg.  - Mod stenosis present on Echo 09/12/23 7. PAD: H/o iliac stents and mesenteric artery bypass. No claudication or intestinal angina. No pedal ulcerations.  - Continue statin.  8. Carotid stenosis: 40-59% L ICA and 1-39% R ICA stenosis on 09/12/23. 9. GI bleeding: 11/24 GI bleeding from erosive esophagitis. CBC has been stable with no overt bleeding.  -  Avoid ETOH.  10. CKD stage 3: sCr at baseline ~1.4-1.7.  - SCr up to 2.0 today  - Check CBC. Encouraged increase in PO intake  11. Mitral stenosis: Mild mitral stenosis on 11/24 echo with mean gradient 5 mmHg.  On cath 09/11/23, simultaneous LV/PCWP pressures were not measured, but PCWP 29 and LVEDP 11 suggests gradient across mitral valve with significant mitral stenosis.  - no mitral stenosis noted on 12/24 echo. Mild MR. 12. Deconditioning - CIR following. Awaiting insurance approval   Length of Stay: [id number] PA-C  09/20/2023, 9:35 AM  Advanced Heart Failure Team Pager [phone] (M-F; 7a - 5p)  Please contact [facility] for night-coverage after hours (5p -7a ) and weekends on [email or web address]  Patient seen and examined with the above-signed Advanced Practice Provider and/or Housestaff. I personally reviewed laboratory data, imaging studies and relevant notes. I independently examined the patient and formulated the important aspects of the plan. I have edited the note to reflect any of my changes or salient points. I have personally discussed the plan with the patient and/or  family.  Denies CP or SOB. Remains in NSR. Awaiting placement.   General:  Well appearing. No resp difficulty HEENT: normal Neck: supple. no JVD. Carotids 2+ bilat; + bruits. No lymphadenopathy or thryomegaly appreciated. Cor: PMI nondisplaced. Regular rate & rhythm. 2/6 AS Lungs: clear Abdomen: soft, nontender, nondistended. No hepatosplenomegaly. No bruits or masses. Good bowel sounds. Extremities: no cyanosis, clubbing, rash, edema Neuro: alert & orientedx3, cranial nerves grossly intact. moves all 4 extremities w/o difficulty. Affect pleasant  Stable from cardiac standpoint. Remains in NSR.  Volume status looks good. Scr up slightly. Off all diuretics. Continue to follow.   Continue Eliquis and Plavix for recent stent. No ASA.   Case d/w CIR and SW. Still working on placement for him.   Arvilla Meres, MD  2:34 PM

## 2023-09-20 NOTE — Progress Notes (Signed)
CARDIAC REHAB PHASE I   PRE:  Rate/Rhythm: 64 NSR  BP:  Sitting: 107/60      SpO2: 98 RA  MODE:  Ambulation: 80 ft    POST:  Rate/Rhythm: 77 NSR  BP:  Sitting: 156/64      SpO2: 98 RA  Pt ambulated with CGA assist with gait belt in the hallway using RW, pt denies SOB and dizziness but is still limited by fatigue. Pt returned to room and assisted back into bed with all needs met prior to departure.  Connor Congress  MS, ACSM-CEP 9:17 AM 09/20/2023    Service time is from 0842 to [phone].

## 2023-09-20 NOTE — TOC Progression Note (Addendum)
Transition of Care [facility]) - Progression Note    Patient Details  Name: Connor Bryant MRN: [id number] Date of Birth: 20-Jan-1946  Transition of Care [facility]) CM/SW Contact  Elliot Cousin, RN Phone Number: [phone] 09/20/2023, 12:53 PM  Clinical Narrative:     HF TOC CM contacted [facility] and they do not have a bed for admission today or weekend. States to check Monday, IP rehab auth pending insurance approval.   Expected Discharge Plan: IP Rehab Facility Barriers to Discharge: Continued Medical Work up  Expected Discharge Plan and Services   Discharge Planning Services: CM Consult Post Acute Care Choice: IP Rehab Living arrangements for the past 2 months: Single Family Home                           HH Arranged: PT, RN [facility] Agency: [facility] Date [facility] Agency Contacted: 09/12/23 Time HH Agency Contacted: 1445 Representative spoke with at [facility] Agency: Hassel Neth   Social Determinants of Health (SDOH) Interventions SDOH Screenings   Food Insecurity: No Food Insecurity (09/11/2023)  Housing: Low Risk  (09/11/2023)  Transportation Needs: No Transportation Needs (09/11/2023)  Utilities: Not At Risk (09/11/2023)  Alcohol Screen: Low Risk  (08/19/2023)  Depression (PHQ2-9): Low Risk  (08/27/2023)  Financial Resource Strain: Low Risk  (08/19/2023)  Physical Activity: Insufficiently Active (12/03/2022)  Social Connections: Moderately Isolated (12/03/2022)  Stress: No Stress Concern Present (12/03/2022)  Tobacco Use: Medium Risk (09/12/2023)    Readmission Risk Interventions     No data to display

## 2023-09-20 NOTE — TOC Progression Note (Signed)
Transition of Care [facility]) - Progression Note    Patient Details  Name: Connor Bryant MRN: [id number] Date of Birth: 09/08/1946  Transition of Care [facility]) CM/SW Contact  Nicanor Bake Phone Number: [phone] 09/20/2023, 3:33 PM  Clinical Narrative:   HF CSW met with pt at bedside. CSW and pt discussed pts accepted SNF offers. CSW explained the SNF process. Pt stated that he was most interested in CIR. Pt stated that he understands auth pending insurance approval. CSW informed pt that [facility] SNF declined and discussed current accepted offers. CSW provided pt with written list of options for pt to review. CSW stated that she will follow up with pt on Monday to see if there was any facilities that interest him. Pt was adamant about being most interested in CIR over SNF.   TOC will continue following.     Expected Discharge Plan: IP Rehab Facility Barriers to Discharge: Continued Medical Work up  Expected Discharge Plan and Services   Discharge Planning Services: CM Consult Post Acute Care Choice: IP Rehab Living arrangements for the past 2 months: Single Family Home                           HH Arranged: PT, RN [facility] Agency: [facility] Date [facility] Agency Contacted: 09/12/23 Time HH Agency Contacted: 1445 Representative spoke with at [facility] Agency: Hassel Neth   Social Determinants of Health (SDOH) Interventions SDOH Screenings   Food Insecurity: No Food Insecurity (09/11/2023)  Housing: Low Risk  (09/11/2023)  Transportation Needs: No Transportation Needs (09/11/2023)  Utilities: Not At Risk (09/11/2023)  Alcohol Screen: Low Risk  (08/19/2023)  Depression (PHQ2-9): Low Risk  (08/27/2023)  Financial Resource Strain: Low Risk  (08/19/2023)  Physical Activity: Insufficiently Active (12/03/2022)  Social Connections: Moderately Isolated (12/03/2022)  Stress: No Stress Concern Present (12/03/2022)  Tobacco Use: Medium Risk (09/12/2023)    Readmission Risk  Interventions     No data to display

## 2023-09-21 DIAGNOSIS — I5023 Acute on chronic systolic (congestive) heart failure: Secondary | ICD-10-CM | POA: Diagnosis not present

## 2023-09-21 LAB — CBC
HCT: 32.4 % — ABNORMAL LOW (ref 39.0–52.0)
Hemoglobin: 10.5 g/dL — ABNORMAL LOW (ref 13.0–17.0)
MCH: 30.4 pg (ref 26.0–34.0)
MCHC: 32.4 g/dL (ref 30.0–36.0)
MCV: 93.9 fL (ref 80.0–100.0)
Platelets: 205 10*3/uL (ref 150–400)
RBC: 3.45 MIL/uL — ABNORMAL LOW (ref 4.22–5.81)
RDW: 16.8 % — ABNORMAL HIGH (ref 11.5–15.5)
WBC: 6.9 10*3/uL (ref 4.0–10.5)
nRBC: 0 % (ref 0.0–0.2)

## 2023-09-21 LAB — BASIC METABOLIC PANEL
Anion gap: 11 (ref 5–15)
BUN: 22 mg/dL (ref 8–23)
CO2: 21 mmol/L — ABNORMAL LOW (ref 22–32)
Calcium: 9 mg/dL (ref 8.9–10.3)
Chloride: 104 mmol/L (ref 98–111)
Creatinine, Ser: 1.7 mg/dL — ABNORMAL HIGH (ref 0.61–1.24)
GFR, Estimated: 41 mL/min — ABNORMAL LOW (ref >60)
Glucose, Bld: 142 mg/dL — ABNORMAL HIGH (ref 70–99)
Potassium: 4 mmol/L (ref 3.5–5.1)
Sodium: 136 mmol/L (ref 135–145)

## 2023-09-21 NOTE — Plan of Care (Signed)
  Problem: Education: Goal: Individualized Educational Video(s) Outcome: Progressing   Problem: Activity: Goal: Ability to return to baseline activity level will improve Outcome: Progressing   Problem: Cardiovascular: Goal: Ability to achieve and maintain adequate cardiovascular perfusion will improve Outcome: Progressing   Problem: Cardiovascular: Goal: Vascular access site(s) Level 0-1 will be maintained Outcome: Progressing

## 2023-09-21 NOTE — Progress Notes (Signed)
CARDIAC REHAB PHASE I   PRE:  Rate/Rhythm: SR  BP:   Sitting: 122/63     SaO2: 94% RA  MODE:  Ambulation: 80 ft   POST:  Rate/Rhythem: 74  BP:    Sitting: 130/75     SaO2: 95% RA  [id number] Patient reported feeling weak when asked if he wanted to increase his distance. Patient was assisted back to bed with call bell within reach. Thayer Headings RN BSN   Arta Bruce Harlon Flor

## 2023-09-21 NOTE — Progress Notes (Signed)
Mobility Specialist Progress Note:   09/21/23 0925  Mobility  Activity Ambulated with assistance in hallway  Level of Assistance Contact guard assist, steadying assist  Assistive Device Front wheel walker  Distance Ambulated (ft) 120 ft  Activity Response Tolerated well  Mobility Referral Yes  Mobility visit 1 Mobility  Mobility Specialist Start Time (ACUTE ONLY) [id number]  Mobility Specialist Stop Time (ACUTE ONLY) [id number]  Mobility Specialist Time Calculation (min) (ACUTE ONLY) 13 min   Pt agreeable to mobility session. Required only minG assist throughout ambulation with RW. Pt c/o R side weakness. Back in bed with all needs met.   Addison Lank Mobility Specialist Please contact via SecureChat or  Rehab office at [phone]

## 2023-09-21 NOTE — Progress Notes (Signed)
   Rounding Note    Patient Name: Connor Bryant Date of Encounter: 09/21/2023  [facility] Cardiologist: Dina Rich, MD   Subjective   NAEO. Awaiting placement.  Vital Signs    Vitals:   09/20/23 2335 09/21/23 0426 09/21/23 0621 09/21/23 0726  BP: 118/62 115/60 130/77 99/72  Pulse: 64 62 66 64  Resp: 19 14  19   Temp: 98.3 F (36.8 C) 98 F (36.7 C)  98 F (36.7 C)  TempSrc: Oral Oral  Oral  SpO2: 97% 97%  96%  Weight:  79.3 kg    Height:        Intake/Output Summary (Last 24 hours) at 09/21/2023 0837 Last data filed at 09/21/2023 0428 Gross per 24 hour  Intake 240 ml  Output 700 ml  Net -460 ml      09/21/2023    4:26 AM 09/20/2023    5:43 AM 09/19/2023    5:00 AM  Last 3 Weights  Weight (lbs) 174 lb 14.4 oz 176 lb 2.4 oz 175 lb 7.8 oz  Weight (kg) 79.334 kg 79.9 kg 79.6 kg      Physical Exam   GEN: No acute distress.   Cardiac: RRR, no murmurs, rubs, or gallops.  Respiratory: Clear to auscultation bilaterally. Psych: Normal affect   Assessment & Plan    #Chronic systolic HF - cont GDMT  #CAD No CP Cont plavix  #pers AF Cont eliquis Cont amiodarone  Awaiting placement.    Sheria Lang T. Lalla Brothers, MD, [facility], [facility] Cardiac Electrophysiology

## 2023-09-22 DIAGNOSIS — I5023 Acute on chronic systolic (congestive) heart failure: Secondary | ICD-10-CM | POA: Diagnosis not present

## 2023-09-22 LAB — CBC
HCT: 32.9 % — ABNORMAL LOW (ref 39.0–52.0)
Hemoglobin: 10.9 g/dL — ABNORMAL LOW (ref 13.0–17.0)
MCH: 31 pg (ref 26.0–34.0)
MCHC: 33.1 g/dL (ref 30.0–36.0)
MCV: 93.5 fL (ref 80.0–100.0)
Platelets: 218 10*3/uL (ref 150–400)
RBC: 3.52 MIL/uL — ABNORMAL LOW (ref 4.22–5.81)
RDW: 16.5 % — ABNORMAL HIGH (ref 11.5–15.5)
WBC: 7.3 10*3/uL (ref 4.0–10.5)
nRBC: 0 % (ref 0.0–0.2)

## 2023-09-22 NOTE — Plan of Care (Signed)
  Problem: Education: Goal: Understanding of CV disease, CV risk reduction, and recovery process will improve Outcome: Progressing   Problem: Activity: Goal: Ability to return to baseline activity level will improve Outcome: Progressing   Problem: Cardiovascular: Goal: Ability to achieve and maintain adequate cardiovascular perfusion will improve Outcome: Progressing Goal: Vascular access site(s) Level 0-1 will be maintained Outcome: Progressing   

## 2023-09-22 NOTE — Progress Notes (Signed)
° °  Rounding Note    Patient Name: Connor Bryant Date of Encounter: 09/22/2023  [facility] Cardiologist: Dina Rich, MD   Subjective   NAEO. Awaiting placement. No complaints this AM.  Vital Signs    Vitals:   09/21/23 1942 09/21/23 2238 09/22/23 0430 09/22/23 0746  BP: 123/63 (!) 110/55 (!) 121/59 (!) 109/56  Pulse: 63 63 72 68  Resp:  13 18 16   Temp: 97.8 F (36.6 C) 97.6 F (36.4 C) 97.6 F (36.4 C) 97.7 F (36.5 C)  TempSrc: Oral Oral Oral Oral  SpO2: 97% 98% 94% 98%  Weight:   78.7 kg   Height:        Intake/Output Summary (Last 24 hours) at 09/22/2023 0833 Last data filed at 09/22/2023 0748 Gross per 24 hour  Intake 1080 ml  Output 1500 ml  Net -420 ml      09/22/2023    4:30 AM 09/21/2023    4:26 AM 09/20/2023    5:43 AM  Last 3 Weights  Weight (lbs) 173 lb 8 oz 174 lb 14.4 oz 176 lb 2.4 oz  Weight (kg) 78.699 kg 79.334 kg 79.9 kg      Physical Exam   GEN: No acute distress.   Cardiac: RRR, no murmurs, rubs, or gallops.  Respiratory: Clear to auscultation bilaterally. Psych: Normal affect   Assessment & Plan    #Chronic systolic HF - cont GDMT  #CAD No CP Cont plavix  #pers AF Cont eliquis Cont amiodarone  Awaiting placement.    Sheria Lang T. Lalla Brothers, MD, [facility], [facility] Cardiac Electrophysiology

## 2023-09-23 DIAGNOSIS — I5043 Acute on chronic combined systolic (congestive) and diastolic (congestive) heart failure: Secondary | ICD-10-CM | POA: Diagnosis not present

## 2023-09-23 LAB — CBC
HCT: 33.3 % — ABNORMAL LOW (ref 39.0–52.0)
Hemoglobin: 11.1 g/dL — ABNORMAL LOW (ref 13.0–17.0)
MCH: 31.1 pg (ref 26.0–34.0)
MCHC: 33.3 g/dL (ref 30.0–36.0)
MCV: 93.3 fL (ref 80.0–100.0)
Platelets: 227 10*3/uL (ref 150–400)
RBC: 3.57 MIL/uL — ABNORMAL LOW (ref 4.22–5.81)
RDW: 16.2 % — ABNORMAL HIGH (ref 11.5–15.5)
WBC: 7.4 10*3/uL (ref 4.0–10.5)
nRBC: 0 % (ref 0.0–0.2)

## 2023-09-23 LAB — BASIC METABOLIC PANEL
Anion gap: 13 (ref 5–15)
BUN: 26 mg/dL — ABNORMAL HIGH (ref 8–23)
CO2: 20 mmol/L — ABNORMAL LOW (ref 22–32)
Calcium: 9.4 mg/dL (ref 8.9–10.3)
Chloride: 105 mmol/L (ref 98–111)
Creatinine, Ser: 1.87 mg/dL — ABNORMAL HIGH (ref 0.61–1.24)
GFR, Estimated: 36 mL/min — ABNORMAL LOW (ref >60)
Glucose, Bld: 147 mg/dL — ABNORMAL HIGH (ref 70–99)
Potassium: 3.8 mmol/L (ref 3.5–5.1)
Sodium: 138 mmol/L (ref 135–145)

## 2023-09-23 NOTE — Plan of Care (Signed)
  Problem: Education: Goal: Understanding of CV disease, CV risk reduction, and recovery process will improve Outcome: Progressing   Problem: Activity: Goal: Ability to return to baseline activity level will improve Outcome: Progressing   Problem: Cardiovascular: Goal: Ability to achieve and maintain adequate cardiovascular perfusion will improve Outcome: Progressing   Problem: Education: Goal: Knowledge of General Education information will improve Description Including pain rating scale, medication(s)/side effects and non-pharmacologic comfort measures Outcome: Progressing

## 2023-09-23 NOTE — Progress Notes (Signed)
Mobility Specialist Progress Note:   09/23/23 1000  Mobility  Activity Ambulated with assistance in hallway  Level of Assistance Contact guard assist, steadying assist  Assistive Device Front wheel walker  Distance Ambulated (ft) 70 ft  Activity Response Tolerated fair  Mobility Referral Yes  Mobility visit 1 Mobility  Mobility Specialist Start Time (ACUTE ONLY) 1012  Mobility Specialist Stop Time (ACUTE ONLY) 1022  Mobility Specialist Time Calculation (min) (ACUTE ONLY) 10 min    Pre Mobility: 66 HR,  95% SpO2 Post Mobility:  72 HR,  146/73 (93) BP,  99% SpO2  Pt received in bed, agreeable to mobility. C/o dizziness upon sitting EOB and having trouble sitting up right. No complaints throughout ambulation, though distance limited. Pt left in bed with call bell and bed alarm on. RN notified.  D'Vante Earlene Plater Mobility Specialist Please contact via Special educational needs teacher or Rehab office at [phone]

## 2023-09-23 NOTE — Progress Notes (Addendum)
Advanced Heart Failure Rounding Note  Cardiologist: Dina Rich, MD  Chief Complaint: Acute on Chronic Systolic Heart Failure  Subjective:    12/23: PCI w DES x2 to LAD and RCA.   Awaiting insurance approval for CIR. Considering SNF options as well per case management. Labs pending. Weight continues to trend down.  Denies SOB, CP. Occasional dizziness when first waking up. Endorses eating okay. Continues to work with PT and mobility specialists.   Objective:    Weight Range: 78.5 kg Body mass index is 26.31 kg/m.   Vital Signs:   Temp:  [97.7 F (36.5 C)-98.4 F (36.9 C)] 97.8 F (36.6 C) (12/30 0508) Pulse Rate:  [65-75] 67 (12/30 0508) Resp:  [16-19] 16 (12/30 0508) BP: (107-158)/(56-77) 112/60 (12/30 0508) SpO2:  [96 %-98 %] 96 % (12/30 0508) Weight:  [78.5 kg] 78.5 kg (12/30 0513) Last BM Date : 09/21/23  Weight change: Filed Weights   09/21/23 0426 09/22/23 0430 09/23/23 0513  Weight: 79.3 kg 78.7 kg 78.5 kg   Intake/Output:   Intake/Output Summary (Last 24 hours) at 09/23/2023 0725 Last data filed at 09/23/2023 0510 Gross per 24 hour  Intake 600 ml  Output 925 ml  Net -325 ml   Physical Exam    General: Frail appearing. No distress on RA HEENT: neck supple.   Cardiac: JVP not visible. S1 and S2 present. 2/6 AS murmur RUSB. Resp: Lung sounds clear and equal B/L Abdomen: Soft, non-tender, non-distended. + BS. Extremities: Warm and dry. No rash, cyanosis.  No edema.  Neuro: Alert and oriented x3. Affect flat. Moves all extremities without difficulty.  Telemetry   SR in 70s (personally reviewed)  EKG    No new EKG to review  Labs    CBC Recent Labs    09/22/23 0153 09/23/23 0203  WBC 7.3 7.4  HGB 10.9* 11.1*  HCT 32.9* 33.3*  MCV 93.5 93.3  PLT 218 227    Basic Metabolic Panel Recent Labs    62/13/08 0207  NA 136  K 4.0  CL 104  CO2 21*  GLUCOSE 142*  BUN 22  CREATININE 1.70*  CALCIUM 9.0   Liver Function  Tests No results for input(s): "AST", "ALT", "ALKPHOS", "BILITOT", "PROT", "ALBUMIN" in the last 72 hours.  No results for input(s): "LIPASE", "AMYLASE" in the last 72 hours. Cardiac Enzymes No results for input(s): "CKTOTAL", "CKMB", "CKMBINDEX", "TROPONINI" in the last 72 hours.  BNP: BNP (last 3 results) Recent Labs    08/19/23 0213 09/03/23 1621 09/11/23 1637  BNP 1,228.1* 1,602.8* 1,478.1*   ProBNP (last 3 results) No results for input(s): "PROBNP" in the last 8760 hours.  D-Dimer No results for input(s): "DDIMER" in the last 72 hours. Hemoglobin A1C No results for input(s): "HGBA1C" in the last 72 hours. Fasting Lipid Panel No results for input(s): "CHOL", "HDL", "LDLCALC", "TRIG", "CHOLHDL", "LDLDIRECT" in the last 72 hours. Thyroid Function Tests No results for input(s): "TSH", "T4TOTAL", "T3FREE", "THYROIDAB" in the last 72 hours.  Invalid input(s): "FREET3"  Other results:  Imaging   No results found.   Medications:    Scheduled Medications:  allopurinol  100 mg Oral BID   amiodarone  200 mg Oral Daily   apixaban  5 mg Oral BID   atorvastatin  80 mg Oral Daily   carvedilol  12.5 mg Oral BID WC   clopidogrel  75 mg Oral Q breakfast   dapagliflozin propanediol  10 mg Oral Daily   feeding supplement  237 mL  Oral BID BM   ferrous sulfate  325 mg Oral Daily   folic acid  1 mg Oral Daily   Living Better with Heart Failure Book   Does not apply Once   pantoprazole  40 mg Oral BID   polyethylene glycol  17 g Oral Daily   pyridOXINE  50 mg Oral Daily   sodium chloride flush  3 mL Intravenous Q12H   sodium chloride flush  3 mL Intravenous Q12H   spironolactone  12.5 mg Oral Daily    Infusions:    PRN Medications: acetaminophen, docusate sodium, ondansetron (ZOFRAN) IV, sodium chloride flush, sodium chloride flush  Patient Profile   Connor Bryant is a 77 y.o. male with past medical history of CVA, persistent Afib, and chronic systolic heart failure.    Assessment/Plan   1. Acute on chronic systolic CHF: Drop in EF in 8/21 to 35% with apical akinesis in setting CVA with improvement to normal EF by 10/21. This was thought to be due to stress cardiomyopathy.  Echo in 11/24 showed EF back down to EF 30-35% with diffuse hypokinesis, normal RV, PASP 60 mmHg, mild-moderate MR, moderate AS. This was in the setting of atrial fibrillation and anemia. I did not think that atrial fibrillation was the cause of this as rate was well-controlled (not tachycardia-mediated). LHC/RHC 12/18 showed critical proximal LAD and severe ostial RCA stenoses, suggesting an ischemic cardiomyopathy. Mildly elevated RA pressure, elevated PCWP with prominent v-waves, normal LVEDP. Simultaneous LV/PCWP pressures were not measured, but PCWP 29 and LVEDP 11 suggests gradient across mitral valve with significant mitral stenosis. Moderate pulmonary venous hypertension. Low but not markedly low cardiac output (CI 2.23 Fick, 2.03 thermo).  Now s/p PCA w DES x2 to LAD and RCA. - Echo 09/12/23: EF 30-35%, mod reduced RV function. Mild MR and Mod AS.  - Euvolemic on exam  - Continue Farxiga 10 mg  - Continue Spiro 12.5 mg daily. BMET pending. - Continue Coreg 12.5 mg bid.  - Repeat echo in 3 months, ?ICD.  2. CAD: Patient has not had any chest pain. He has had exertional dyspnea. Echo in 11/24 showed EF down to 30-35%. Cath 12/18 showed severe, heavily calcified proximal LAD stenosis after D1 and S1 (99%) with TIMI 2 flow down the distal LAD. Suspect 80% heavily calcified ostial RCA stenosis. Suspect ischemic cardiomyopathy.  - s/p PCI w DES x2 to LAD and RCA. Will need monotherapy antiplatelet with Plavix (on Eliquis for Afib) - Stable w/o CP  - Continue Plavix - Continue statin daily 3. Atrial fibrillation: Persistent. He is back on Eliquis after recent GI bleeding from erosive gastritis. He was started on amiodarone at 11/24 admission. NSR on tele.  - on Eliquis. Hgb stable. -  Continue amiodarone 200 mg daily - If he has further GI bleeding, would consider [facility] for him.  - Consider AF ablation in future.  4. CVA: 8/21, suspect due to atrial fibrillation.   5. PE: In 8/21, he is on Eliquis.  6. Aortic stenosis: AS looked moderate on 11/24 echo. S2 can be heard clearly. However, aortic valve was easily crossed on cath 09/11/23 and peak-to-peak gradient was only 8 mmHg.  - Mod stenosis present on Echo 09/12/23 7. PAD: H/o iliac stents and mesenteric artery bypass. No claudication or intestinal angina. No pedal ulcerations.  - Continue statin.  8. Carotid stenosis: 40-59% L ICA and 1-39% R ICA stenosis on 09/12/23. 9. GI bleeding: 11/24 GI bleeding from erosive esophagitis. CBC has been  stable with no overt bleeding.  - Avoid ETOH.  10. CKD stage 3: sCr at baseline ~1.4-1.7.  - SCr trending down on last check 1.7. BMET pending today. - Check CBC. Encouraged increase in PO intake  11. Mitral stenosis: Mild mitral stenosis on 11/24 echo with mean gradient 5 mmHg.  On cath 09/11/23, simultaneous LV/PCWP pressures were not measured, but PCWP 29 and LVEDP 11 suggests gradient across mitral valve with significant mitral stenosis.  - no mitral stenosis noted on 12/24 echo. Mild MR. 12. Deconditioning - CIR following. Awaiting insurance approval  - Considering SNF options incase CIR not approved. CM following.  Length of Stay: 70  [place] Lee, NP  09/23/2023, 7:25 AM  Advanced Heart Failure Team Pager [phone] (M-F; 7a - 5p)  Please contact [facility] for night-coverage after hours (5p -7a ) and weekends on [email or web address]  Patient seen with NP, agree with the above note.   Patient reports mild lightheadedness with intially standing up this morning but resolved.  Was able to walk with walker.   General: NAD Neck: No JVD, no thyromegaly or thyroid nodule.  Lungs: Clear to auscultation bilaterally with normal respiratory effort. CV: Nondisplaced PMI.  Heart  regular S1/S2, no S3/S4, 2/6 SEM RUSB.  No peripheral edema.  Abdomen: Soft, nontender, no hepatosplenomegaly, no distention.  Skin: Intact without lesions or rashes.  Neurologic: Alert and oriented x 3.  Psych: Normal affect. Extremities: No clubbing or cyanosis.  HEENT: Normal.   Patient looks euvolemic on exam.  Continue current GDMT, would not titrate with some orthostatic symptoms.  He does not require Lasix.   Continue amiodarone and Eliquis for PAF, now NSR.   He is on Plavix, no ASA post PCI given Eliquis use.   He has not been approved for CIR, will need to look into the [facility] for him.   Marca Ancona 09/23/2023 2:10 PM

## 2023-09-23 NOTE — Progress Notes (Signed)
Inpatient Rehab Admissions Coordinator:   Spoke with rep from [employer]. Determination has not been made yet. Continue to wait for decision.   Rehab Admissons Coordinator [place], [place], [place] [phone]

## 2023-09-23 NOTE — Plan of Care (Signed)
  Problem: Education: Goal: Knowledge of General Education information will improve Description: Including pain rating scale, medication(s)/side effects and non-pharmacologic comfort measures Outcome: Progressing   Problem: Pain Management: Goal: General experience of comfort will improve Outcome: Progressing   Problem: Safety: Goal: Ability to remain free from injury will improve Outcome: Progressing   Problem: Skin Integrity: Goal: Risk for impaired skin integrity will decrease Outcome: Progressing   Problem: Education: Goal: Ability to demonstrate management of disease process will improve Outcome: Progressing   Problem: Activity: Goal: Capacity to carry out activities will improve Outcome: Progressing

## 2023-09-23 NOTE — Progress Notes (Addendum)
Inpatient Rehab Admissions Coordinator:   Addendum:  Met with patient at bedside, he was aware insurance denied. Will sign off at this time.   Received call back from [facility]. Connor Bryant authorization was denied for CIR. Will discuss with patient this pm. Other discharge dispositions will need to be explored.   Rehab Admissons Coordinator [place], [place], [place] [phone]

## 2023-09-23 NOTE — TOC Progression Note (Signed)
Transition of Care [facility]) - Progression Note    Patient Details  Name: Connor Bryant MRN: [id number] Date of Birth: 03/03/1946  [facility] CM/SW Contact  Nicanor Bake Phone Number: [phone] 09/23/2023, 11:40 AM  Clinical Narrative:  HF CSW met with pt at bedside. Pt stated that he looked over SNFs and was not pleased with any of the options. Pt stated that he would still like to wait on pending auth for CIR at this time.   TOC will continue following.      Expected Discharge Plan: IP Rehab Facility Barriers to Discharge: Continued Medical Work up  Expected Discharge Plan and Services   Discharge Planning Services: CM Consult Post Acute Care Choice: IP Rehab Living arrangements for the past 2 months: Single Family Home                           HH Arranged: PT, RN [facility] Agency: [facility] Date [facility] Agency Contacted: 09/12/23 Time HH Agency Contacted: [phone] Representative spoke with at [facility] Agency: Hassel Neth   Social Determinants of Health (SDOH) Interventions SDOH Screenings   Food Insecurity: No Food Insecurity (09/11/2023)  Housing: Low Risk  (09/11/2023)  Transportation Needs: No Transportation Needs (09/11/2023)  Utilities: Not At Risk (09/11/2023)  Alcohol Screen: Low Risk  (08/19/2023)  Depression (PHQ2-9): Low Risk  (08/27/2023)  Financial Resource Strain: Low Risk  (08/19/2023)  Physical Activity: Insufficiently Active (12/03/2022)  Social Connections: Moderately Isolated (12/03/2022)  Stress: No Stress Concern Present (12/03/2022)  Tobacco Use: Medium Risk (09/12/2023)    Readmission Risk Interventions     No data to display

## 2023-09-23 NOTE — Progress Notes (Signed)
Occupational Therapy Treatment Patient Details Name: Connor Bryant MRN: [id number] DOB: [birth year] Today's Date: 09/23/2023   History of present illness Pt is a 77yo male who presented with ischemic cardiomyopathy and is now s/p coronary stent. PMH: acute on chronic systolic CHF, CAD, a-fib, CVA   OT comments  Pt progressing toward established goals. HR and SpO2 stable this session, but observed with DOE and RR up to 28 breaths per min. Challenging activity tolerance this session with 2 standing grooming tasks and short distance mobility into hall. Needing up to min A for balance; mild R drift. Will continue to follow. Recommending intensive inpatient rehabilitation at discharge; of note insurance declined AIR; if unable will need inpatient rehab <3 hours.       If plan is discharge home, recommend the following:  Assistance with cooking/housework;Assist for transportation;A little help with walking and/or transfers;A little help with bathing/dressing/bathroom;Help with stairs or ramp for entrance   Equipment Recommendations  Other (comment) (defer)    Recommendations for Other Services      Precautions / Restrictions Precautions Precautions: Fall Restrictions Weight Bearing Restrictions Per Provider Order: No       Mobility Bed Mobility Overal bed mobility: Modified Independent                  Transfers Overall transfer level: Needs assistance Equipment used: Rolling walker (2 wheels) Transfers: Sit to/from Stand Sit to Stand: Contact guard assist           General transfer comment: VCs for hand placement     Balance Overall balance assessment: Needs assistance Sitting-balance support: Feet supported, No upper extremity supported Sitting balance-Leahy Scale: Good     Standing balance support: Bilateral upper extremity supported, Reliant on assistive device for balance Standing balance-Leahy Scale: Poor Standing balance comment: dependent on RW                            ADL either performed or assessed with clinical judgement   ADL Overall ADL's : Needs assistance/impaired     Grooming: Contact guard assist;Standing;Wash/dry face;Oral care                   Toilet Transfer: Contact guard assist;Rolling walker (2 wheels)           Functional mobility during ADLs: Contact guard assist;Rolling walker (2 wheels);Minimal assistance      Extremity/Trunk Assessment              Vision       Perception     Praxis      Cognition Arousal: Alert Behavior During Therapy: Flat affect Overall Cognitive Status: Within Functional Limits for tasks assessed                                 General Comments: pt aware of deficits including loss of peripheral vision in L eye from recent CVA, flat affect/depressed spirits due to not being safe at home by himself. Following up to 2 step commands. aware of R side being weaker and very mild R drift during mobility        Exercises      Shoulder Instructions       General Comments VSS; 2/4 DOE    Pertinent Vitals/ Pain       Pain Assessment Pain Assessment: No/denies pain  Home Living  Prior Functioning/Environment              Frequency  Min 1X/week        Progress Toward Goals  OT Goals(current goals can now be found in the care plan section)  Progress towards OT goals: Progressing toward goals  Acute Rehab OT Goals Patient Stated Goal: get better and go to rehab OT Goal Formulation: With patient Time For Goal Achievement: 10/01/23 Potential to Achieve Goals: Good ADL Goals Pt Will Perform Grooming: with modified independence;standing Pt Will Perform Lower Body Dressing: with modified independence;sit to/from stand Pt Will Transfer to Toilet: with modified independence;ambulating Pt/caregiver will Perform Home Exercise Program: Increased strength;Both right and left  upper extremity;With written HEP provided Additional ADL Goal #1: Pt will optimize activity tolerance to take sponge bath in standing at sink  Plan      Co-evaluation                 AM-PAC OT "6 Clicks" Daily Activity     Outcome Measure   Help from another person eating meals?: None Help from another person taking care of personal grooming?: A Little Help from another person toileting, which includes using toliet, bedpan, or urinal?: A Little Help from another person bathing (including washing, rinsing, drying)?: A Little Help from another person to put on and taking off regular upper body clothing?: A Little Help from another person to put on and taking off regular lower body clothing?: A Little 6 Click Score: 19    End of Session Equipment Utilized During Treatment: Gait belt;Rolling walker (2 wheels)  OT Visit Diagnosis: Other abnormalities of gait and mobility (R26.89);Muscle weakness (generalized) (M62.81)   Activity Tolerance Patient tolerated treatment well   Patient Left in bed;with call bell/phone within reach;with bed alarm set   Nurse Communication Mobility status        Time: 1459-1520 OT Time Calculation (min): 21 min  Charges: OT General Charges $OT Visit: 1 Visit OT Treatments $Self Care/Home Management : 8-22 mins  Tyler Deis, OTR/L [facility] Acute Rehabilitation Office: [phone]   Myrla Halsted 09/23/2023, 3:56 PM

## 2023-09-24 ENCOUNTER — Ambulatory Visit: Payer: Medicare Other | Admitting: Nurse Practitioner

## 2023-09-24 LAB — CBC
HCT: 33 % — ABNORMAL LOW (ref 39.0–52.0)
Hemoglobin: 11.1 g/dL — ABNORMAL LOW (ref 13.0–17.0)
MCH: 31.2 pg (ref 26.0–34.0)
MCHC: 33.6 g/dL (ref 30.0–36.0)
MCV: 92.7 fL (ref 80.0–100.0)
Platelets: 214 10*3/uL (ref 150–400)
RBC: 3.56 MIL/uL — ABNORMAL LOW (ref 4.22–5.81)
RDW: 16.2 % — ABNORMAL HIGH (ref 11.5–15.5)
WBC: 6.6 10*3/uL (ref 4.0–10.5)
nRBC: 0 % (ref 0.0–0.2)

## 2023-09-24 LAB — BASIC METABOLIC PANEL
Anion gap: 10 (ref 5–15)
BUN: 27 mg/dL — ABNORMAL HIGH (ref 8–23)
CO2: 20 mmol/L — ABNORMAL LOW (ref 22–32)
Calcium: 9.3 mg/dL (ref 8.9–10.3)
Chloride: 105 mmol/L (ref 98–111)
Creatinine, Ser: 1.97 mg/dL — ABNORMAL HIGH (ref 0.61–1.24)
GFR, Estimated: 34 mL/min — ABNORMAL LOW (ref >60)
Glucose, Bld: 136 mg/dL — ABNORMAL HIGH (ref 70–99)
Potassium: 3.7 mmol/L (ref 3.5–5.1)
Sodium: 135 mmol/L (ref 135–145)

## 2023-09-24 MED ORDER — SACUBITRIL-VALSARTAN 24-26 MG PO TABS
1.0000 | ORAL_TABLET | Freq: Two times a day (BID) | ORAL | Status: DC
  Administered 2023-09-24 – 2023-09-25 (×2): 1 via ORAL
  Filled 2023-09-24 (×2): qty 1

## 2023-09-24 NOTE — Progress Notes (Signed)
 CARDIAC REHAB PHASE I   CRP1 education completed. Referral fro CRP2 sent to [facility]. Pt awaits discharge to [facility] center. CRP1 will sign off.     Woodroe Chen, RN BSN 09/24/2023 12:51 PM

## 2023-09-24 NOTE — Progress Notes (Signed)
 Physical Therapy Treatment Patient Details Name: Connor Bryant MRN: [id number] DOB: April 03, 1946 Today's Date: 09/24/2023   History of Present Illness Pt is a 77yo male who presented with ischemic cardiomyopathy and is now s/p coronary stent. PMH: acute on chronic systolic CHF, CAD, a-fib, CVA    PT Comments  Pt continues with dizziness this date upon sitting and ambulating inhibiting ambulation this date. Pt only able to amb 22' with RW this date prior to SOB, strong feeling of fatigue, and dizziness. Pt to continue to benefit from inpatient rehab <3 hrs/day to address below deficits to achieve save level of mobility to return home alone. Acute PT to cont to follow.   If plan is discharge home, recommend the following: A little help with walking and/or transfers;A little help with bathing/dressing/bathroom;Assistance with cooking/housework;Assist for transportation;Direct supervision/assist for medications management   Can travel by private vehicle        Equipment Recommendations  None recommended by PT    Recommendations for Other Services Rehab consult     Precautions / Restrictions Precautions Precautions: Fall Restrictions Weight Bearing Restrictions Per Provider Order: No     Mobility  Bed Mobility Overal bed mobility: Needs Assistance Bed Mobility: Supine to Sit, Sit to Supine     Supine to sit: Supervision Sit to supine: Supervision   General bed mobility comments: increased time, definite use of bed rail, HOB elevated    Transfers Overall transfer level: Needs assistance Equipment used: Rolling walker (2 wheels) Transfers: Sit to/from Stand Sit to Stand: Contact guard assist           General transfer comment: VCs for hand placement, assist to control descent when returning to sitting    Ambulation/Gait Ambulation/Gait assistance: Min assist Gait Distance (Feet): 22 Feet Assistive device: Rolling walker (2 wheels) Gait Pattern/deviations:  Step-through pattern, Decreased stride length Gait velocity: dec     General Gait Details: pt with report of dizziness this date, SPO2 94% on RA, limited today by onset of SOB and fatigue   Stairs             Wheelchair Mobility     Tilt Bed    Modified Rankin (Stroke Patients Only)       Balance Overall balance assessment: Needs assistance Sitting-balance support: Feet supported, No upper extremity supported Sitting balance-Leahy Scale: Good     Standing balance support: Bilateral upper extremity supported, Reliant on assistive device for balance Standing balance-Leahy Scale: Poor Standing balance comment: dependent on RW                            Cognition Arousal: Alert Behavior During Therapy: Flat affect Overall Cognitive Status: Within Functional Limits for tasks assessed                                 General Comments: pt with depressed spirits regarding d/c plan and denial of CIR by insurance.        Exercises      General Comments General comments (skin integrity, edema, etc.): VSS, 3/4 DOE with amb      Pertinent Vitals/Pain Pain Assessment Pain Assessment: No/denies pain    Home Living                          Prior Function  PT Goals (current goals can now be found in the care plan section) Acute Rehab PT Goals Patient Stated Goal: get stronger to go home PT Goal Formulation: With patient Time For Goal Achievement: 10/01/23 Potential to Achieve Goals: Good Progress towards PT goals: Not progressing toward goals - comment (more fatigued this date)    Frequency    Min 1X/week      PT Plan      Co-evaluation              AM-PAC PT 6 Clicks Mobility   Outcome Measure  Help needed turning from your back to your side while in a flat bed without using bedrails?: A Little Help needed moving from lying on your back to sitting on the side of a flat bed without using  bedrails?: A Little Help needed moving to and from a bed to a chair (including a wheelchair)?: A Little Help needed standing up from a chair using your arms (e.g., wheelchair or bedside chair)?: A Lot Help needed to walk in hospital room?: A Lot Help needed climbing 3-5 steps with a railing? : A Lot 6 Click Score: 15    End of Session Equipment Utilized During Treatment: Gait belt Activity Tolerance: Patient limited by fatigue Patient left: with call bell/phone within reach;in chair;with nursing/sitter in room Nurse Communication: Mobility status PT Visit Diagnosis: Other abnormalities of gait and mobility (R26.89);Muscle weakness (generalized) (M62.81)     Time: 8679-8664 PT Time Calculation (min) (ACUTE ONLY): 15 min  Charges:    $Gait Training: 8-22 mins PT General Charges $$ ACUTE PT VISIT: 1 Visit                    Connor Bryant, PT, DPT Acute Rehabilitation Services Secure chat preferred Office #: [phone]    Connor CHRISTELLA Bryant 09/24/2023, 2:16 PM

## 2023-09-24 NOTE — Plan of Care (Signed)
  Problem: Education: Goal: Understanding of CV disease, CV risk reduction, and recovery process will improve Outcome: Progressing Goal: Individualized Educational Video(s) Outcome: Progressing   Problem: Activity: Goal: Ability to return to baseline activity level will improve Outcome: Progressing   Problem: Cardiovascular: Goal: Ability to achieve and maintain adequate cardiovascular perfusion will improve Outcome: Progressing Goal: Vascular access site(s) Level 0-1 will be maintained Outcome: Progressing   Problem: Health Behavior/Discharge Planning: Goal: Ability to safely manage health-related needs after discharge will improve Outcome: Progressing   Problem: Education: Goal: Knowledge of General Education information will improve Description: Including pain rating scale, medication(s)/side effects and non-pharmacologic comfort measures Outcome: Progressing   Problem: Health Behavior/Discharge Planning: Goal: Ability to manage health-related needs will improve Outcome: Progressing   Problem: Clinical Measurements: Goal: Ability to maintain clinical measurements within normal limits will improve Outcome: Progressing Goal: Will remain free from infection Outcome: Progressing Goal: Diagnostic test results will improve Outcome: Progressing Goal: Respiratory complications will improve Outcome: Progressing Goal: Cardiovascular complication will be avoided Outcome: Progressing   Problem: Activity: Goal: Risk for activity intolerance will decrease Outcome: Progressing   Problem: Nutrition: Goal: Adequate nutrition will be maintained Outcome: Progressing   Problem: Coping: Goal: Level of anxiety will decrease Outcome: Progressing   Problem: Elimination: Goal: Will not experience complications related to bowel motility Outcome: Progressing Goal: Will not experience complications related to urinary retention Outcome: Progressing   Problem: Pain Management: Goal:  General experience of comfort will improve Outcome: Progressing   Problem: Safety: Goal: Ability to remain free from injury will improve Outcome: Progressing   Problem: Skin Integrity: Goal: Risk for impaired skin integrity will decrease Outcome: Progressing   Problem: Education: Goal: Ability to demonstrate management of disease process will improve Outcome: Progressing Goal: Ability to verbalize understanding of medication therapies will improve Outcome: Progressing Goal: Individualized Educational Video(s) Outcome: Progressing   Problem: Activity: Goal: Capacity to carry out activities will improve Outcome: Progressing   Problem: Cardiac: Goal: Ability to achieve and maintain adequate cardiopulmonary perfusion will improve Outcome: Progressing

## 2023-09-24 NOTE — Progress Notes (Addendum)
 Inpatient Rehab Admissions Coordinator:   Addendum: I was able to withdraw appeal from [facility]. Signing off as patient likely going to SNF.   Received secure chat from social work, Arlana Moats, that insurance authorization was going to be submitted for a bed in SNF. I called [employer] to withdraw expedited appeal. I was unable to do so, as customer service did not have appeal showing yet. Will need to follow up to ensure appeal is withdrawn.   Rehab Admissons Coordinator [place], [place], [place] [phone]

## 2023-09-24 NOTE — Progress Notes (Signed)
 Inpatient Rehab Admissions Coordinator:   Met with patient at bedside. Patient is agreeable to appeal insurance denial for CIR, however he still wants to see if there is a bed available at [facility] in [place]. Will continue to follow for insurance decision.   Rehab Admissons Coordinator Rahn Lacuesta, [place], [place] [phone]

## 2023-09-24 NOTE — Progress Notes (Addendum)
 Advanced Heart Failure Rounding Note  Cardiologist: Alvan Carrier, MD  Chief Complaint: Acute on Chronic Systolic Heart Failure  Subjective:    12/23: PCI w DES x2 to LAD and RCA.   Insurance denied CIR. Arrangements being made per CM for [facility].  Occasional dizziness when waking in the morning, resolved by afternoon. Orthostatics in the past have been negative. No CP or SOB. Anxious about dispo planning and weakness.   Objective:    Weight Range: 77.4 kg Body mass index is 25.95 kg/m.   Vital Signs:   Temp:  [97.8 F (36.6 C)-98.8 F (37.1 C)] 98 F (36.7 C) (12/31 0816) Pulse Rate:  [63-70] 68 (12/31 0421) Resp:  [13-23] 18 (12/31 0816) BP: (109-122)/(50-74) 114/61 (12/31 0816) SpO2:  [94 %-96 %] 95 % (12/31 0421) Weight:  [77.4 kg] 77.4 kg (12/31 0421) Last BM Date : 09/23/23  Weight change: Filed Weights   09/22/23 0430 09/23/23 0513 09/24/23 0421  Weight: 78.7 kg 78.5 kg 77.4 kg   Intake/Output:   Intake/Output Summary (Last 24 hours) at 09/24/2023 0818 Last data filed at 09/24/2023 0800 Gross per 24 hour  Intake 477 ml  Output 850 ml  Net -373 ml   Physical Exam    General: Frail appearing. No distress on RA HEENT: neck supple.   Cardiac: JVP not visible. S1 and S2 present. 2/6 AS murmur RUSB. Resp: Lung sounds clear and equal B/L Abdomen: Soft, non-tender, non-distended. + BS. Extremities: Warm and dry. No rash, cyanosis. Trace edema in L knee Neuro: Alert and oriented x3. Affect flat.  Equal strength bilaterally in all 4 extremities  Telemetry   SR in 70s (personally reviewed)  EKG    No new EKG to review  Labs    CBC Recent Labs    09/23/23 0203 09/24/23 0224  WBC 7.4 6.6  HGB 11.1* 11.1*  HCT 33.3* 33.0*  MCV 93.3 92.7  PLT 227 214    Basic Metabolic Panel Recent Labs    87/69/75 0203 09/24/23 0224  NA 138 135  K 3.8 3.7  CL 105 105  CO2 20* 20*  GLUCOSE 147* 136*  BUN 26* 27*  CREATININE 1.87* 1.97*   CALCIUM  9.4 9.3   Liver Function Tests No results for input(s): AST, ALT, ALKPHOS, BILITOT, PROT, ALBUMIN  in the last 72 hours.  No results for input(s): LIPASE, AMYLASE in the last 72 hours. Cardiac Enzymes No results for input(s): CKTOTAL, CKMB, CKMBINDEX, TROPONINI in the last 72 hours.  BNP: BNP (last 3 results) Recent Labs    08/19/23 0213 09/03/23 1621 09/11/23 1637  BNP 1,228.1* 1,602.8* 1,478.1*   ProBNP (last 3 results) No results for input(s): PROBNP in the last 8760 hours.  D-Dimer No results for input(s): DDIMER in the last 72 hours. Hemoglobin A1C No results for input(s): HGBA1C in the last 72 hours. Fasting Lipid Panel No results for input(s): CHOL, HDL, LDLCALC, TRIG, CHOLHDL, LDLDIRECT in the last 72 hours. Thyroid  Function Tests No results for input(s): TSH, T4TOTAL, T3FREE, THYROIDAB in the last 72 hours.  Invalid input(s): FREET3  Other results:  Imaging   No results found.   Medications:    Scheduled Medications:  allopurinol   100 mg Oral BID   amiodarone   200 mg Oral Daily   apixaban   5 mg Oral BID   atorvastatin   80 mg Oral Daily   carvedilol   12.5 mg Oral BID WC   clopidogrel   75 mg Oral Q breakfast   dapagliflozin  propanediol  10 mg Oral Daily   feeding supplement  237 mL Oral BID BM   ferrous sulfate   325 mg Oral Daily   folic acid   1 mg Oral Daily   Living Better with Heart Failure Book   Does not apply Once   pantoprazole   40 mg Oral BID   polyethylene glycol  17 g Oral Daily   pyridOXINE   50 mg Oral Daily   sodium chloride  flush  3 mL Intravenous Q12H   sodium chloride  flush  3 mL Intravenous Q12H   spironolactone   12.5 mg Oral Daily    Infusions:    PRN Medications: acetaminophen , docusate sodium , ondansetron  (ZOFRAN ) IV, sodium chloride  flush, sodium chloride  flush  Patient Profile   Connor Bryant is a 77 y.o. male with past medical history of CVA, persistent Afib,  and chronic systolic heart failure.   Assessment/Plan   1. Acute on chronic systolic CHF: Drop in EF in 8/21 to 35% with apical akinesis in setting CVA with improvement to normal EF by 10/21. This was thought to be due to stress cardiomyopathy.  Echo in 11/24 showed EF back down to EF 30-35% with diffuse hypokinesis, normal RV, PASP 60 mmHg, mild-moderate MR, moderate AS. This was in the setting of atrial fibrillation and anemia. I did not think that atrial fibrillation was the cause of this as rate was well-controlled (not tachycardia-mediated). LHC/RHC 12/18 showed critical proximal LAD and severe ostial RCA stenoses, suggesting an ischemic cardiomyopathy. Mildly elevated RA pressure, elevated PCWP with prominent v-waves, normal LVEDP. Simultaneous LV/PCWP pressures were not measured, but PCWP 29 and LVEDP 11 suggests gradient across mitral valve with significant mitral stenosis. Moderate pulmonary venous hypertension. Low but not markedly low cardiac output (CI 2.23 Fick, 2.03 thermo).  Now s/p PCA w DES x2 to LAD and RCA. - Echo 09/12/23: EF 30-35%, mod reduced RV function. Mild MR and Mod AS.  - Euvolemic on exam  - Continue Farxiga  10 mg  - Continue Spiro 12.5 mg daily. - Continue Coreg  12.5 mg bid.  - Repeat echo in 3 months, ?ICD.  2. CAD: Patient has not had any chest pain. He has had exertional dyspnea. Echo in 11/24 showed EF down to 30-35%. Cath 12/18 showed severe, heavily calcified proximal LAD stenosis after D1 and S1 (99%) with TIMI 2 flow down the distal LAD. Suspect 80% heavily calcified ostial RCA stenosis. Suspect ischemic cardiomyopathy.  - s/p PCI w DES x2 to LAD and RCA. Will need monotherapy antiplatelet with Plavix  (on Eliquis  for Afib) - Stable w/o CP  - Continue Plavix  - Continue statin daily 3. Atrial fibrillation: Persistent. He is back on Eliquis  after recent GI bleeding from erosive gastritis. He was started on amiodarone  at 11/24 admission. NSR on tele.  - on Eliquis .  Hgb stable. - Continue amiodarone  200 mg daily - If he has further GI bleeding, would consider Watchman for him.  - Consider AF ablation in future.  4. CVA: 8/21, suspect due to atrial fibrillation.   5. PE: In 8/21, he is on Eliquis .  6. Aortic stenosis: AS looked moderate on 11/24 echo. S2 can be heard clearly. However, aortic valve was easily crossed on cath 09/11/23 and peak-to-peak gradient was only 8 mmHg.  - Mod stenosis present on Echo 09/12/23 7. PAD: H/o iliac stents and mesenteric artery bypass. No claudication or intestinal angina. No pedal ulcerations.  - Continue statin.  8. Carotid stenosis: 40-59% L ICA and 1-39% R ICA stenosis on 09/12/23. 9. GI bleeding:  11/24 GI bleeding from erosive esophagitis. CBC has been stable with no overt bleeding.  - Avoid ETOH.  10. CKD stage 3b: sCr baseline ~1.4-2.0 (difficult to assess, fluctuant).  - sCr 1.97 11. Mitral stenosis: Mild mitral stenosis on 11/24 echo with mean gradient 5 mmHg.  On cath 09/11/23, simultaneous LV/PCWP pressures were not measured, but PCWP 29 and LVEDP 11 suggests gradient across mitral valve with significant mitral stenosis.  - no mitral stenosis noted on 12/24 echo. Mild MR. 12. Deconditioning - Insurance denied CIR - CM working to get bed placement at [facility] Stay: 13  Jordan Lee, NP  09/24/2023, 8:18 AM  Advanced Heart Failure Team Pager [phone] (M-F; 7a - 5p)  Please contact [facility] for night-coverage after hours (5p -7a ) and weekends on [email or web address]  Patient seen with NP, agree with the above note.   No complaints today, walking on unit.   General: NAD Neck: No JVD, no thyromegaly or thyroid  nodule.  Lungs: Clear to auscultation bilaterally with normal respiratory effort. CV: Nondisplaced PMI.  Heart regular S1/S2, no S3/S4, no murmur.  No peripheral edema.   Abdomen: Soft, nontender, no hepatosplenomegaly, no distention.  Skin: Intact without lesions or rashes.   Neurologic: Alert and oriented x 3.  Psych: Normal affect. Extremities: No clubbing or cyanosis.  HEENT: Normal.   Still waiting to get bed at SNF/rehab.   Will start Entresto  today and watch BP.   Ezra Shuck 09/24/2023 4:04 PM

## 2023-09-24 NOTE — TOC Progression Note (Signed)
 Transition of Care [facility]) - Progression Note    Patient Details  Name: Connor Bryant MRN: [id number] Date of Birth: 1946/07/23  Transition of [facility]) CM/SW Contact  Justina Delcia Czar, RN Phone Number: [phone] 09/24/2023, 4:37 PM  Clinical Narrative:    Patient was approved for SNF [id number] , approved 12/31 - 1/2 NRD 1/2, Will have unit CSW arrange for dc in am to [facility], updated attending. Spoke to SNF rehab CSW, Isaiah, states he can come tomorrow, she is covering, contact # [phone].    Expected Discharge Plan: IP Rehab Facility Barriers to Discharge: Continued Medical Work up  Expected Discharge Plan and Services   Discharge Planning Services: CM Consult Post Acute Care Choice: IP Rehab Living arrangements for the past 2 months: Single Family Home                           HH Arranged: PT, RN [facility] Agency: [facility] Date [facility] Agency Contacted: 09/12/23 Time HH Agency Contacted: 1445 Representative spoke with at [facility] Agency: Burnard Bucks   Social Determinants of Health (SDOH) Interventions SDOH Screenings   Food Insecurity: No Food Insecurity (09/11/2023)  Housing: Low Risk  (09/11/2023)  Transportation Needs: No Transportation Needs (09/11/2023)  Utilities: Not At Risk (09/11/2023)  Alcohol Screen: Low Risk  (08/19/2023)  Depression (PHQ2-9): Low Risk  (08/27/2023)  Financial Resource Strain: Low Risk  (08/19/2023)  Physical Activity: Insufficiently Active (12/03/2022)  Social Connections: Moderately Isolated (09/24/2023)  Stress: No Stress Concern Present (12/03/2022)  Tobacco Use: Medium Risk (09/12/2023)    Readmission Risk Interventions     No data to display

## 2023-09-24 NOTE — TOC Initial Note (Addendum)
 Transition of Care [facility]) - Initial/Assessment Note    Patient Details  Name: Connor Bryant MRN: [id number] Date of Birth: 31-Oct-1945  Transition of Care [facility]) CM/SW Contact:    Justina Delcia Czar, RN Phone Number: [phone] 09/24/2023, 1:35 PM  Clinical Narrative:                  HF TOC CM spoke to pt at bedside. He was declined by [facility] after 2nd review from DON. Pt reviewed list and states his wife was at [facility] and felt they had good therapy team. Pt was denied for IP rehab, and spoke to coordinator on yesterday. States he was not offered an appeal for IP rehab. Contacted [employer] and left message for SNF rehab CSW, Isaiah, waiting call back. Updated attending. Waiting call back from [facility].   Discussion with patient and stated his son works full-time and unable to provide 24 hour care, he has finance but not enough to cover for an aide 24 hours per day.   Spoke to [place] # [phone] at [facility] and they have a bed, will need to start auth. They will dc summary before 430 pm and pt has to be at facility before 7pm today. Will see if we can get auth for SNF rehab.      Expected Discharge Plan: IP Rehab Facility Barriers to Discharge: Continued Medical Work up   Patient Goals and CMS Choice Patient states their goals for this hospitalization and ongoing recovery are:: wants to get better CMS Medicare.gov Compare Post Acute Care list provided to:: Patient        Expected Discharge Plan and Services   Discharge Planning Services: CM Consult Post Acute Care Choice: IP Rehab Living arrangements for the past 2 months: Single Family Home                           HH Arranged: PT, RN HH Agency: [facility] Date [facility] Agency Contacted: 09/12/23 Time HH Agency Contacted: 1445 Representative spoke with at [facility] Agency: Burnard Bucks  Prior Living Arrangements/Services Living arrangements for the past 2 months: Single Family Home Lives with::  Self Patient language and need for interpreter reviewed:: Yes Do you feel safe going back to the place where you live?: Yes      Need for Family Participation in Patient Care: Yes (Comment) Care giver support system in place?: Yes (comment) Current home services: DME (wheelchair, rolling walker) Criminal Activity/Legal Involvement Pertinent to Current Situation/Hospitalization: No - Comment as needed  Activities of Daily Living   ADL Screening (condition at time of admission) Independently performs ADLs?: No Does the patient have a NEW difficulty with bathing/dressing/toileting/self-feeding that is expected to last >3 days?: Yes (Initiates electronic notice to provider for possible OT consult) Does the patient have a NEW difficulty with getting in/out of bed, walking, or climbing stairs that is expected to last >3 days?: Yes (Initiates electronic notice to provider for possible PT consult) Does the patient have a NEW difficulty with communication that is expected to last >3 days?: No Is the patient deaf or have difficulty hearing?: No Does the patient have difficulty seeing, even when wearing glasses/contacts?: No Does the patient have difficulty concentrating, remembering, or making decisions?: No  Permission Sought/Granted Permission sought to share information with : Case Manager, Family Supports, PCP Permission granted to share information with : Yes, Verbal Permission Granted  Share Information  with NAME: Shailen Thielen  Permission granted to share info w AGENCY: Home Health, DME  Permission granted to share info w Relationship: son  Permission granted to share info w Contact Information: [phone]  Emotional Assessment   Attitude/Demeanor/Rapport: Engaged Affect (typically observed): Appropriate Orientation: : Oriented to Self, Oriented to Place, Oriented to  Time, Oriented to Situation   Psych Involvement: No (comment)  Admission diagnosis:  CHF (congestive heart failure)  (HCC) [I50.9] Patient Active Problem List   Diagnosis Date Noted   Coronary artery disease involving native coronary artery of native heart without angina pectoris 09/15/2023   Acute on chronic systolic heart failure (HCC) 09/14/2023   CHF (congestive heart failure) (HCC) 09/11/2023   HFrEF (heart failure with reduced ejection fraction) (HCC) 08/21/2023   Acute on chronic anemia 08/18/2023   Symptomatic anemia 08/18/2023   On apixaban  therapy 08/18/2023   Paroxysmal atrial fibrillation (HCC) 08/18/2023   GI bleed 08/17/2023   GIB (gastrointestinal bleeding) 08/17/2023   Hypotension 08/17/2023   AKI (acute kidney injury) (HCC) 08/17/2023   Respiratory distress 08/17/2023   Rheumatoid arthritis involving multiple sites with positive rheumatoid factor (HCC) 06/25/2023   Diabetes mellitus treated with oral medication (HCC) 06/03/2023   Non-ST elevation (NSTEMI) myocardial infarction [facility])    Acute systolic heart failure (HCC)    Elevated troponin 05/03/2020   Fever 05/03/2020   Positive D dimer 05/03/2020   PAF (paroxysmal atrial fibrillation) (HCC) 05/02/2020   S/P insertion of iliac artery stent 05/01/2020   Diabetic lipidosis (HCC) 03/10/2020   Intermittent claudication (HCC) 03/10/2020   Compression fracture of T12 vertebra (HCC) 10/27/2018   Spinal stenosis of lumbosacral region 10/27/2018   Small bowel obstruction (HCC) 09/30/2018   Back pain of lumbar region with sciatica 08/26/2018   Chronic left-sided low back pain with left-sided sciatica 08/26/2018   Tremor 08/26/2018   Pure hypercholesterolemia 02/25/2018   Atherosclerosis of aorta (HCC) 02/24/2018   Bilateral hip pain 06/24/2017   Body mass index 30.0-30.9, adult 06/06/2016   Gout 06/06/2016   Essential hypertension 06/06/2016   Chronic mesenteric ischemia (HCC) 03/01/2014   Peripheral vascular disease (HCC) 02/16/2014   Superior mesenteric artery stenosis (HCC) 02/16/2014   History of colonic polyps 11/24/2013    PCP:  Zollie Lowers, MD Pharmacy:   [facility] [place], [place] - [address] [place] [address] Phone: [phone] Fax: [phone]  [id number] - [place], [place] - [address] [place] [address] Phone: [phone] Fax: [phone]  [facility] [address] [place] [address] Phone: [phone] Fax: [phone]     Social Drivers of Health (SDOH) Social History: SDOH Screenings   Food Insecurity: No Food Insecurity (09/11/2023)  Housing: Low Risk  (09/11/2023)  Transportation Needs: No Transportation Needs (09/11/2023)  Utilities: Not At Risk (09/11/2023)  Alcohol Screen: Low Risk  (08/19/2023)  Depression (PHQ2-9): Low Risk  (08/27/2023)  Financial Resource Strain: Low Risk  (08/19/2023)  Physical Activity: Insufficiently Active (12/03/2022)  Social Connections: Moderately Isolated (12/03/2022)  Stress: No Stress Concern Present (12/03/2022)  Tobacco Use: Medium Risk (09/12/2023)   SDOH Interventions:     Readmission Risk Interventions     No data to display

## 2023-09-25 DIAGNOSIS — Z955 Presence of coronary angioplasty implant and graft: Principal | ICD-10-CM

## 2023-09-25 DIAGNOSIS — I952 Hypotension due to drugs: Secondary | ICD-10-CM

## 2023-09-25 DIAGNOSIS — I350 Nonrheumatic aortic (valve) stenosis: Secondary | ICD-10-CM

## 2023-09-25 LAB — CBC
HCT: 35.1 % — ABNORMAL LOW (ref 39.0–52.0)
Hemoglobin: 11.6 g/dL — ABNORMAL LOW (ref 13.0–17.0)
MCH: 30.5 pg (ref 26.0–34.0)
MCHC: 33 g/dL (ref 30.0–36.0)
MCV: 92.4 fL (ref 80.0–100.0)
Platelets: 263 10*3/uL (ref 150–400)
RBC: 3.8 MIL/uL — ABNORMAL LOW (ref 4.22–5.81)
RDW: 16.2 % — ABNORMAL HIGH (ref 11.5–15.5)
WBC: 8.2 10*3/uL (ref 4.0–10.5)
nRBC: 0.2 % (ref 0.0–0.2)

## 2023-09-25 LAB — BASIC METABOLIC PANEL
Anion gap: 13 (ref 5–15)
BUN: 31 mg/dL — ABNORMAL HIGH (ref 8–23)
CO2: 19 mmol/L — ABNORMAL LOW (ref 22–32)
Calcium: 9.3 mg/dL (ref 8.9–10.3)
Chloride: 104 mmol/L (ref 98–111)
Creatinine, Ser: 1.97 mg/dL — ABNORMAL HIGH (ref 0.61–1.24)
GFR, Estimated: 34 mL/min — ABNORMAL LOW (ref >60)
Glucose, Bld: 149 mg/dL — ABNORMAL HIGH (ref 70–99)
Potassium: 3.8 mmol/L (ref 3.5–5.1)
Sodium: 136 mmol/L (ref 135–145)

## 2023-09-25 MED ORDER — LOSARTAN POTASSIUM 25 MG PO TABS: 25.0000 mg | ORAL_TABLET | Freq: Every day | ORAL | Status: DC

## 2023-09-25 NOTE — Progress Notes (Signed)
 Patient Name: Connor Bryant Date of Encounter: 09/25/2023 [facility] Cardiologist: Alvan Carrier, MD   Interval Summary  .    Feeling OK. Notes that his blood pressure is low.  He ambulated in the hall and did not feel lightheaded or dizzy.  Denies chest pain or pressure.  Vital Signs .    Vitals:   09/24/23 2345 09/25/23 0431 09/25/23 0643 09/25/23 0754  BP: (!) 94/55 (!) 90/53 (!) 103/58 (!) 98/54  Pulse: 66 70 67 69  Resp: 17 20  15   Temp: 98.4 F (36.9 C) 97.7 F (36.5 C)  97.8 F (36.6 C)  TempSrc: Oral Oral  Oral  SpO2: 95% 97%  96%  Weight:  77.9 kg    Height:        Intake/Output Summary (Last 24 hours) at 09/25/2023 1046 Last data filed at 09/25/2023 0756 Gross per 24 hour  Intake 480 ml  Output 800 ml  Net -320 ml      09/25/2023    4:31 AM 09/24/2023    4:21 AM 09/23/2023    5:13 AM  Last 3 Weights  Weight (lbs) 171 lb 11.2 oz 170 lb 10.2 oz 173 lb 1 oz  Weight (kg) 77.883 kg 77.4 kg 78.5 kg      Telemetry/ECG    Sinus rhythm.  No events- Personally Reviewed  Physical Exam .   GEN: No acute distress.   Neck: No JVD Cardiac: RRR, II/VI systolic murmur at the left upper sternal border, rubs, or gallops.  Respiratory: Clear to auscultation bilaterally. GI: Soft, nontender, non-distended  MS: No edema  Assessment & Plan .   69M with CVA, persistent atrial fibrillation, aortic stenosis, and HFrEF admitted acute with acute on chronic heart failure.  # Acute on chronic HFrEF:  LVEF 04/2020 was 35% with apical akinesis in the setting of CVA.  EF normalized then reduced again 07/2023 to 30-35% with diffuse hypokinesis.  He is now euvolemic.  He underwent a left and right heart cath 12/18 which showed critical proximal LAD and severe ostial RCA stenosis, suggesting an ischemic cardiomyopathy at this time.  Prior episode of cardiomyopathy was thought to be stress-induced.  He underwent PCI with drug-eluting stents to both LAD and RCA.  Recommend  repeating echo in 3 months for consideration of ICD.  Continue spironolactone , carvedilol , and Farxiga .  He was started on Entresto  yesterday and blood pressures today are quite low.  Am concerned that he will go to rehab tomorrow and be sent back to the hospital due to low blood pressures.  Recommend switching to losartan  25 mg daily instead.  As his blood pressures rise as an outpatient, would consider adding Entresto  at that time.  # CAD:  # Hyperlipidemia: Status post LAD and RCA PCI as above.  Continue clopidogrel , carvedilol , and atorvastatin .  # Persistent atrial fibrillation:  Currently maintaining sinus rhythm.  On amiodarone  and Eliquis .  # Prior PE:  On Eliquis .  # Aortic stenosis:  Moderate aortic stenosis.  Mean gradient was 7 mmHg.  However valve area by VTI was 0.98 cm.  Likely low-flow low gradient causing lower pressures across the valve due to redo systolic function.  Reassess on echo in 3 months.  # Mitral stenosis:  Mitral valve is degenerative.  Mean gradient 2 mmHg.  Repeat on echo in 3 months as above.  # Dispo: Patient wants to go to CIR and has appealed his insurance company.  He has been approved for discharge to  SNF in [place] tomorrow.  For questions or updates, please contact [facility] Please consult [email or web address] for contact info under        Signed, Annabella Scarce, MD

## 2023-09-25 NOTE — Plan of Care (Signed)
  Problem: Activity: Goal: Ability to return to baseline activity level will improve Outcome: Progressing   Problem: Education: Goal: Knowledge of General Education information will improve Description: Including pain rating scale, medication(s)/side effects and non-pharmacologic comfort measures Outcome: Progressing   Problem: Clinical Measurements: Goal: Cardiovascular complication will be avoided Outcome: Progressing

## 2023-09-25 NOTE — Plan of Care (Signed)
  Problem: Education: Goal: Understanding of CV disease, CV risk reduction, and recovery process will improve Outcome: Progressing Goal: Individualized Educational Video(s) Outcome: Progressing   Problem: Activity: Goal: Ability to return to baseline activity level will improve Outcome: Progressing   Problem: Cardiovascular: Goal: Ability to achieve and maintain adequate cardiovascular perfusion will improve Outcome: Progressing Goal: Vascular access site(s) Level 0-1 will be maintained Outcome: Progressing   Problem: Health Behavior/Discharge Planning: Goal: Ability to safely manage health-related needs after discharge will improve Outcome: Progressing   Problem: Education: Goal: Knowledge of General Education information will improve Description: Including pain rating scale, medication(s)/side effects and non-pharmacologic comfort measures Outcome: Progressing   Problem: Health Behavior/Discharge Planning: Goal: Ability to manage health-related needs will improve Outcome: Progressing   Problem: Clinical Measurements: Goal: Ability to maintain clinical measurements within normal limits will improve Outcome: Progressing Goal: Will remain free from infection Outcome: Progressing Goal: Diagnostic test results will improve Outcome: Progressing Goal: Respiratory complications will improve Outcome: Progressing Goal: Cardiovascular complication will be avoided Outcome: Progressing   Problem: Activity: Goal: Risk for activity intolerance will decrease Outcome: Progressing   Problem: Nutrition: Goal: Adequate nutrition will be maintained Outcome: Progressing   Problem: Coping: Goal: Level of anxiety will decrease Outcome: Progressing   Problem: Elimination: Goal: Will not experience complications related to bowel motility Outcome: Progressing Goal: Will not experience complications related to urinary retention Outcome: Progressing   Problem: Pain Management: Goal:  General experience of comfort will improve Outcome: Progressing   Problem: Safety: Goal: Ability to remain free from injury will improve Outcome: Progressing   Problem: Skin Integrity: Goal: Risk for impaired skin integrity will decrease Outcome: Progressing   Problem: Education: Goal: Ability to demonstrate management of disease process will improve Outcome: Progressing Goal: Ability to verbalize understanding of medication therapies will improve Outcome: Progressing Goal: Individualized Educational Video(s) Outcome: Progressing   Problem: Activity: Goal: Capacity to carry out activities will improve Outcome: Progressing   Problem: Cardiac: Goal: Ability to achieve and maintain adequate cardiopulmonary perfusion will improve Outcome: Progressing

## 2023-09-25 NOTE — Progress Notes (Signed)
 Mobility Specialist Progress Note:   09/25/23 1153  Mobility  Activity Ambulated with assistance in hallway  Level of Assistance  (MinG)  Assistive Device Front wheel walker  Distance Ambulated (ft) 70 ft  Activity Response Tolerated well  Mobility Referral Yes  Mobility visit 1 Mobility  Mobility Specialist Start Time (ACUTE ONLY) 1000  Mobility Specialist Stop Time (ACUTE ONLY) 1012  Mobility Specialist Time Calculation (min) (ACUTE ONLY) 12 min   Pre Mobility: 98/59 BP  Post Mobility: 141/61 BP  Pt received in bed, agreeable to mobility. CG to stand. MinG during ambulation. Pt c/o slight SOB during ambulation, otherwise asx throughout. Pursed lip breathing encouraged. VSS. Pt returned to bed with call bell in reach and all needs met.   Brown Husband  Mobility Specialist Please contact via [employer] office at [phone]

## 2023-09-26 ENCOUNTER — Other Ambulatory Visit: Payer: Self-pay | Admitting: *Deleted

## 2023-09-26 LAB — BASIC METABOLIC PANEL
Anion gap: 12 (ref 5–15)
BUN: 39 mg/dL — ABNORMAL HIGH (ref 8–23)
CO2: 18 mmol/L — ABNORMAL LOW (ref 22–32)
Calcium: 9.1 mg/dL (ref 8.9–10.3)
Chloride: 103 mmol/L (ref 98–111)
Creatinine, Ser: 2.22 mg/dL — ABNORMAL HIGH (ref 0.61–1.24)
GFR, Estimated: 30 mL/min — ABNORMAL LOW (ref >60)
Glucose, Bld: 155 mg/dL — ABNORMAL HIGH (ref 70–99)
Potassium: 3.9 mmol/L (ref 3.5–5.1)
Sodium: 133 mmol/L — ABNORMAL LOW (ref 135–145)

## 2023-09-26 LAB — CBC
HCT: 34.5 % — ABNORMAL LOW (ref 39.0–52.0)
Hemoglobin: 11.4 g/dL — ABNORMAL LOW (ref 13.0–17.0)
MCH: 30.5 pg (ref 26.0–34.0)
MCHC: 33 g/dL (ref 30.0–36.0)
MCV: 92.2 fL (ref 80.0–100.0)
Platelets: 259 10*3/uL (ref 150–400)
RBC: 3.74 MIL/uL — ABNORMAL LOW (ref 4.22–5.81)
RDW: 16.2 % — ABNORMAL HIGH (ref 11.5–15.5)
WBC: 7.5 10*3/uL (ref 4.0–10.5)
nRBC: 0 % (ref 0.0–0.2)

## 2023-09-26 MED ORDER — CARVEDILOL 6.25 MG PO TABS
6.2500 mg | ORAL_TABLET | Freq: Two times a day (BID) | ORAL | Status: DC
  Administered 2023-09-26 – 2023-09-27 (×2): 6.25 mg via ORAL
  Filled 2023-09-26 (×3): qty 1

## 2023-09-26 MED ORDER — CARVEDILOL 6.25 MG PO TABS: 6.2500 mg | ORAL_TABLET | Freq: Two times a day (BID) | ORAL | Status: DC

## 2023-09-26 MED ORDER — LOSARTAN POTASSIUM 25 MG PO TABS: 12.5000 mg | ORAL_TABLET | Freq: Every day | ORAL | Status: DC

## 2023-09-26 NOTE — Patient Outreach (Signed)
 Care Management  Transitions of Care Program Transitions of Care Post-discharge week 5 (pt remains hospitalized)   09/26/2023 Name: Connor Bryant MRN: [id number] DOB: 01/02/1946  Subjective: Connor Bryant is a 78 y.o. year old male who is a primary care patient of Stacks, Butler, MD. The Care Management team reviewed patient EMR and pt remains in hospital after coronary stent intervention. needs.   Assessment: Patient hospitalized, case closure.          SDOH Interventions    Flowsheet Row Telephone from 08/26/2023 in [place] [facility] DEPARTMENT ED to Hosp-Admission (Discharged) from 08/17/2023 in [place] 2C CV PROGRESSIVE CARE Office Visit from 06/25/2023 in [facility] Clinical Support from 12/03/2022 in [facility] Clinical Support from 11/27/2021 in [facility] Office Visit from 03/02/2021 in [facility]  SDOH Interventions        Food Insecurity Interventions Intervention Not Indicated -- -- Intervention Not Indicated Intervention Not Indicated --  Housing Interventions Intervention Not Indicated -- -- Intervention Not Indicated Intervention Not Indicated --  Transportation Interventions Intervention Not Indicated -- -- Intervention Not Indicated Intervention Not Indicated --  Utilities Interventions Intervention Not Indicated -- -- Intervention Not Indicated -- --  Alcohol Usage Interventions -- Intervention Not Indicated (Score <7) -- Intervention Not Indicated (Score <7) -- --  Depression Interventions/Treatment  -- -- EYV7-0 Score <4 Follow-up Not Indicated -- -- PHQ2-9 Score <4 Follow-up Not Indicated  Financial Strain Interventions -- Intervention Not Indicated -- Intervention Not Indicated Intervention Not Indicated --  Physical Activity Interventions -- -- -- Intervention Not Indicated Intervention Not Indicated --  Stress  Interventions -- -- -- Intervention Not Indicated Intervention Not Indicated --  Social Connections Interventions -- -- -- Intervention Not Indicated Intervention Not Indicated --        Goals Addressed             This Visit's Progress    COMPLETED: Transitions of Care/ pt will have no readmission within 30 days       Patient remains in hospital after coronary stent intervention, case closure Current Barriers:  Chronic Disease Management support and education needs related to CHF and GI bleed  Patient reports doing well since discharge, has no GI bleeding and never did have any  pt states  they are not sure where bleeding is coming from  pt states he lives alone, has son that checks in on him regularly and assists with transportation, 12/17- no GI bleeding reported Patient reports he has a scale and will start weighing again when he is more stable on his feet, home health weighed pt at 189 pounds, has walker he uses regularly Patient reports physical therapist came to his home, completed evaluation and will be working with him, hopefully this will help balance so pt can stand on the scales and work on endurance, activity tolerance as pt continues to have dyspnea with exertion. Patient reports he has no LE edema when he first wakes up but does have some edema as the day goes on and he is up on his feet. Patient saw primary care provider on 12/3, saw cardiology on 12/10 and has cardiac cath scheduled for 12/18, follow up with cardiologist on 12/31 @ 9 am, will follow up with rheumatologist 10/07/23 as a new patient 09/16/23- spoke with patient who reports he had procedure this morning, cardiac cath and still at the hospital recuperating,  requests call back next week  RNCM Clinical Goal(s):  Patient will work with the Care Management team over the next 30 days to address Transition of Care Barriers: Home Health services verbalize understanding of plan for management of CHF and GI bleed  as evidenced by patient report, review of EMR take all medications exactly as prescribed and will call provider for medication related questions as evidenced by patient report, refill history and review of EMR attend all scheduled medical appointments: primary care provider, GI and cardiology as evidenced by patient report, review of EMR  through collaboration with RN Care manager, provider, and care team.   Interventions: Evaluation of current treatment plan related to  self management and patient's adherence to plan as established by provider   Heart Failure Interventions:  (Status:  New goal. and Goal on track:  Yes.) Short Term Goal Basic overview and discussion of pathophysiology of Heart Failure reviewed Reviewed HF action plan, importance of calling provider early on for change in health status/ symptoms Reviewed medications Reviewed all upcoming scheduled appointments  Patient had coronary stent intervention this morning and still at hospital, will outreach pt next week  GI Bleed: Reviewed signs/ symptoms GI bleeding Reviewed importance of reporting any s/s GI bleeding to provider Reviewed low sodium diet Reviewed importance of continuing to work with home health  Patient Goals/Self-Care Activities: Participate in Transition of Care Program/Attend [facility] scheduled calls Notify RN Care Manager of TOC call rescheduling needs Take all medications as prescribed Attend all scheduled provider appointments Call pharmacy for medication refills 3-7 days in advance of running out of medications Call provider office for new concerns or questions  call office if I gain more than 2 pounds in one day or 5 pounds in one week keep legs up while sitting use salt in moderation watch for swelling in feet, ankles and legs every day weigh myself daily develop a rescue plan follow rescue plan if symptoms flare-up eat more whole grains, fruits and vegetables, lean meats and healthy fats dress right  for the weather, hot or cold Report any signs/ symptoms of GI bleeding to your doctor Continue working with home health PT, complete exercises they give you to do RN care manager will contact you next week  Follow Up Plan:  Telephone follow up appointment with care management team member scheduled for:  09/26/23 @ 11am          Plan: will complete TOC upon notification of discharge from hospital  Mliss Creed [facility], BSN RN Care Manager/ Transition of Care [place]/ [facility] [phone]

## 2023-09-26 NOTE — TOC Progression Note (Signed)
 Transition of Care [facility]) - Progression Note    Patient Details  Name: Connor Bryant MRN: [id number] Date of Birth: 1945/11/02  Transition [facility]) CM/SW Contact  Arlana JINNY Moats, LCSWA Phone Number: 09/26/2023, 3:15 PM  Clinical Narrative:   HF CSW received a call from SNF rehab CSW, Isaiah to inquire if pt was ready for dc. CSW informed the facility that the pt was not ready today and could be possibly be ready tomorrow.   TOC will continue following.     Expected Discharge Plan: IP Rehab Facility Barriers to Discharge: Continued Medical Work up  Expected Discharge Plan and Services   Discharge Planning Services: CM Consult Post Acute Care Choice: IP Rehab Living arrangements for the past 2 months: Single Family Home                           HH Arranged: PT, RN [facility] Agency: [facility] Date [facility] Agency Contacted: 09/12/23 Time HH Agency Contacted: [phone] Representative spoke with at [facility] Agency: Burnard Bucks   Social Determinants of Health (SDOH) Interventions SDOH Screenings   Food Insecurity: No Food Insecurity (09/11/2023)  Housing: Low Risk  (09/11/2023)  Transportation Needs: No Transportation Needs (09/11/2023)  Utilities: Not At Risk (09/11/2023)  Alcohol Screen: Low Risk  (08/19/2023)  Depression (PHQ2-9): Low Risk  (08/27/2023)  Financial Resource Strain: Low Risk  (08/19/2023)  Physical Activity: Insufficiently Active (12/03/2022)  Social Connections: Moderately Isolated (09/24/2023)  Stress: No Stress Concern Present (12/03/2022)  Tobacco Use: Medium Risk (09/12/2023)    Readmission Risk Interventions     No data to display

## 2023-09-26 NOTE — Plan of Care (Signed)
  Problem: Education: Goal: Understanding of CV disease, CV risk reduction, and recovery process will improve Outcome: Progressing Goal: Individualized Educational Video(s) Outcome: Progressing   Problem: Activity: Goal: Ability to return to baseline activity level will improve Outcome: Progressing   Problem: Cardiovascular: Goal: Ability to achieve and maintain adequate cardiovascular perfusion will improve Outcome: Progressing Goal: Vascular access site(s) Level 0-1 will be maintained Outcome: Progressing   Problem: Health Behavior/Discharge Planning: Goal: Ability to safely manage health-related needs after discharge will improve Outcome: Progressing   Problem: Education: Goal: Knowledge of General Education information will improve Description: Including pain rating scale, medication(s)/side effects and non-pharmacologic comfort measures Outcome: Progressing   Problem: Health Behavior/Discharge Planning: Goal: Ability to manage health-related needs will improve Outcome: Progressing   Problem: Clinical Measurements: Goal: Ability to maintain clinical measurements within normal limits will improve Outcome: Progressing Goal: Will remain free from infection Outcome: Progressing Goal: Diagnostic test results will improve Outcome: Progressing Goal: Respiratory complications will improve Outcome: Progressing Goal: Cardiovascular complication will be avoided Outcome: Progressing   Problem: Activity: Goal: Risk for activity intolerance will decrease Outcome: Progressing   Problem: Nutrition: Goal: Adequate nutrition will be maintained Outcome: Progressing   Problem: Elimination: Goal: Will not experience complications related to bowel motility Outcome: Progressing Goal: Will not experience complications related to urinary retention Outcome: Progressing

## 2023-09-26 NOTE — Progress Notes (Signed)
 Mobility Specialist Progress Note:   09/26/23 0900  Orthostatic Lying   BP- Lying 105/70  Orthostatic Sitting  BP- Sitting 95/61  Orthostatic Standing at 0 minutes  BP- Standing at 0 minutes (!) 88/64  Mobility  Activity Stood at bedside  Level of Assistance Contact guard assist, steadying assist  Assistive Device Front wheel walker  Activity Response Tolerated fair  Mobility Referral Yes  Mobility visit 1 Mobility  Mobility Specialist Start Time (ACUTE ONLY) [id number]  Mobility Specialist Stop Time (ACUTE ONLY) [id number]  Mobility Specialist Time Calculation (min) (ACUTE ONLY) 9 min    Pt received in bed w/ c/o dizziness, but agreeable. Orthostatic vitals taken, see above. Pt c/o worsening dizziness along w/ dropping BP. Ambulation deferred. Pt left in bed with call bell and all needs met. RN notified.  D'Vante Nicholaus Mobility Specialist Please contact via Special Educational Needs Teacher or Rehab office at [phone]

## 2023-09-26 NOTE — Plan of Care (Signed)
  Problem: Activity: Goal: Risk for activity intolerance will decrease Outcome: Progressing   Problem: Nutrition: Goal: Adequate nutrition will be maintained Outcome: Progressing   Problem: Coping: Goal: Level of anxiety will decrease Outcome: Progressing   Problem: Pain Management: Goal: General experience of comfort will improve Outcome: Progressing   Problem: Safety: Goal: Ability to remain free from injury will improve Outcome: Progressing   Problem: Activity: Goal: Capacity to carry out activities will improve Outcome: Progressing   Problem: Cardiac: Goal: Ability to achieve and maintain adequate cardiopulmonary perfusion will improve Outcome: Progressing

## 2023-09-26 NOTE — Progress Notes (Addendum)
 Advanced Heart Failure Rounding Note  Cardiologist: Alvan Carrier, MD  Chief Complaint: Acute on Chronic Systolic Heart Failure  Subjective:    12/23: PCI w DES x2 to LAD and RCA.   Soft BP yesterday with addition of Entresto . sCr 1.97>2.22 Plan for discharge to [facility]  Sitting up eating breakfast. Feeling well this am. Did not sleep well overnight d/t noise in halls. Mild dizziness when he first stands with PT, but quickly resolves. No CP or dizziness.   Objective:    Weight Range: 77.6 kg Body mass index is 26.01 kg/m.   Vital Signs:   Temp:  [97.6 F (36.4 C)-98 F (36.7 C)] 97.6 F (36.4 C) (01/02 0300) Pulse Rate:  [63-69] 69 (01/02 0642) Resp:  [12-19] 18 (01/02 0642) BP: (91-107)/(50-68) 107/68 (01/02 0300) SpO2:  [94 %-100 %] 98 % (01/02 0642) Weight:  [77.6 kg] 77.6 kg (01/02 0642) Last BM Date : 09/25/23  Weight change: Filed Weights   09/24/23 0421 09/25/23 0431 09/26/23 0642  Weight: 77.4 kg 77.9 kg 77.6 kg   Intake/Output:   Intake/Output Summary (Last 24 hours) at 09/26/2023 0709 Last data filed at 09/26/2023 0310 Gross per 24 hour  Intake 240 ml  Output 950 ml  Net -710 ml   Physical Exam     General: Well appearing. No distress on RA HEENT: neck supple.   Cardiac: S1 and S2 present. 2/6 AS RUSB Resp: Lung sounds clear and equal B/L Abdomen: Soft, non-tender, non-distended. + BS. Extremities: Warm and dry. No rash, cyanosis.  Trace edema in R leg.  Neuro: Alert and oriented x3. Affect pleasant. Moves all extremities without difficulty.  Telemetry   SR in 70s (personally reviewed)  EKG    No new EKG to review  Labs    CBC Recent Labs    09/25/23 0232 09/26/23 0208  WBC 8.2 7.5  HGB 11.6* 11.4*  HCT 35.1* 34.5*  MCV 92.4 92.2  PLT 263 259    Basic Metabolic Panel Recent Labs    98/98/74 0232 09/26/23 0208  NA 136 133*  K 3.8 3.9  CL 104 103  CO2 19* 18*  GLUCOSE 149* 155*  BUN 31* 39*  CREATININE 1.97*  2.22*  CALCIUM  9.3 9.1   Liver Function Tests No results for input(s): AST, ALT, ALKPHOS, BILITOT, PROT, ALBUMIN  in the last 72 hours.  No results for input(s): LIPASE, AMYLASE in the last 72 hours. Cardiac Enzymes No results for input(s): CKTOTAL, CKMB, CKMBINDEX, TROPONINI in the last 72 hours.  BNP: BNP (last 3 results) Recent Labs    08/19/23 0213 09/03/23 1621 09/11/23 1637  BNP 1,228.1* 1,602.8* 1,478.1*   ProBNP (last 3 results) No results for input(s): PROBNP in the last 8760 hours.  D-Dimer No results for input(s): DDIMER in the last 72 hours. Hemoglobin A1C No results for input(s): HGBA1C in the last 72 hours. Fasting Lipid Panel No results for input(s): CHOL, HDL, LDLCALC, TRIG, CHOLHDL, LDLDIRECT in the last 72 hours. Thyroid  Function Tests No results for input(s): TSH, T4TOTAL, T3FREE, THYROIDAB in the last 72 hours.  Invalid input(s): FREET3  Other results:  Imaging   No results found.  Medications:    Scheduled Medications:  allopurinol   100 mg Oral BID   amiodarone   200 mg Oral Daily   apixaban   5 mg Oral BID   atorvastatin   80 mg Oral Daily   carvedilol   12.5 mg Oral BID WC   clopidogrel   75 mg Oral Q breakfast  dapagliflozin  propanediol  10 mg Oral Daily   feeding supplement  237 mL Oral BID BM   ferrous sulfate   325 mg Oral Daily   folic acid   1 mg Oral Daily   Living Better with Heart Failure Book   Does not apply Once   losartan   25 mg Oral Daily   pantoprazole   40 mg Oral BID   polyethylene glycol  17 g Oral Daily   pyridOXINE   50 mg Oral Daily   sodium chloride  flush  3 mL Intravenous Q12H   sodium chloride  flush  3 mL Intravenous Q12H   spironolactone   12.5 mg Oral Daily    Infusions:    PRN Medications: acetaminophen , docusate sodium , ondansetron  (ZOFRAN ) IV, sodium chloride  flush, sodium chloride  flush  Patient Profile   Connor Bryant is a 78 y.o. male with past medical  history of CVA, persistent Afib, and chronic systolic heart failure.   Assessment/Plan   1. Acute on chronic systolic CHF: Drop in EF in 8/21 to 35% with apical akinesis in setting CVA with improvement to normal EF by 10/21. This was thought to be due to stress cardiomyopathy.  Echo in 11/24 showed EF back down to EF 30-35% with diffuse hypokinesis, normal RV, PASP 60 mmHg, mild-moderate MR, moderate AS. This was in the setting of atrial fibrillation and anemia. I did not think that atrial fibrillation was the cause of this as rate was well-controlled (not tachycardia-mediated). LHC/RHC 12/18 showed critical proximal LAD and severe ostial RCA stenoses, suggesting an ischemic cardiomyopathy. Mildly elevated RA pressure, elevated PCWP with prominent v-waves, normal LVEDP. Simultaneous LV/PCWP pressures were not measured, but PCWP 29 and LVEDP 11 suggests gradient across mitral valve with significant mitral stenosis. Moderate pulmonary venous hypertension. Low but not markedly low cardiac output (CI 2.23 Fick, 2.03 thermo).  Now s/p PCA w DES x2 to LAD and RCA. - Echo 09/12/23: EF 30-35%, mod reduced RV function. Mild MR and Mod AS.  - Euvolemic on exam  - Continue Farxiga  10 mg  - Continue Spiro 12.5 mg daily. - Hypotensive with Entresto , BP too low to trial Losartan . - Decrease Coreg  to 6.25 mg bid and hold am dose for hypotension - Repeat echo in 3 months, ?ICD.  2. CAD: Patient has not had any chest pain. He has had exertional dyspnea. Echo in 11/24 showed EF down to 30-35%. Cath 12/18 showed severe, heavily calcified proximal LAD stenosis after D1 and S1 (99%) with TIMI 2 flow down the distal LAD. Suspect 80% heavily calcified ostial RCA stenosis. Suspect ischemic cardiomyopathy.  - s/p PCI w DES x2 to LAD and RCA. Will need monotherapy antiplatelet with Plavix  (on Eliquis  for Afib) - Stable w/o CP  - Continue Plavix  - Continue statin daily 3. Atrial fibrillation: Persistent. He is back on Eliquis   after recent GI bleeding from erosive gastritis. He was started on amiodarone  at 11/24 admission. NSR on tele.  - on Eliquis . Hgb stable. - Continue amiodarone  200 mg daily - If he has further GI bleeding, would consider Watchman for him.  - Consider AF ablation in future.  4. CVA: 8/21, suspect due to atrial fibrillation.   5. PE: In 8/21, he is on Eliquis .  6. Aortic stenosis: AS looked moderate on 11/24 echo. S2 can be heard clearly. However, aortic valve was easily crossed on cath 09/11/23 and peak-to-peak gradient was only 8 mmHg.  - Mod stenosis present on Echo 09/12/23 7. PAD: H/o iliac stents and mesenteric artery bypass. No claudication  or intestinal angina. No pedal ulcerations.  - Continue statin.  8. Carotid stenosis: 40-59% L ICA and 1-39% R ICA stenosis on 09/12/23. 9. GI bleeding: 11/24 GI bleeding from erosive esophagitis. CBC has been stable with no overt bleeding.  - Avoid ETOH.  10. CKD stage 3b: sCr baseline ~1.4-2.0 (difficult to assess, fluctuant).  - sCr bumped with hypotension 1.97>2.22 11. Mitral stenosis: Mild mitral stenosis on 11/24 echo with mean gradient 5 mmHg.  On cath 09/11/23, simultaneous LV/PCWP pressures were not measured, but PCWP 29 and LVEDP 11 suggests gradient across mitral valve with significant mitral stenosis.  - no mitral stenosis noted on 12/24 echo. Mild MR. 12. Deconditioning - Has bed at [facility] of Stay: 15  Jordan Lee, NP  09/26/2023, 7:09 AM  Advanced Heart Failure Team Pager [phone] (M-F; 7a - 5p)  Please contact [facility] for night-coverage after hours (5p -7a ) and weekends on [email or web address]  Patient seen with NP, agree with the above note.   Hypotensive and dizzy after starting Entresto .  Have stopped Entresto , was still dizzy this morning so cut back Coreg .  Now feels better.   General: NAD Neck: No JVD, no thyromegaly or thyroid  nodule.  Lungs: Clear to auscultation bilaterally with normal respiratory  effort. CV: Nondisplaced PMI.  Heart regular S1/S2, no S3/S4, no murmur.  No peripheral edema.   Abdomen: Soft, nontender, no hepatosplenomegaly, no distention.  Skin: Intact without lesions or rashes.  Neurologic: Alert and oriented x 3.  Psych: Normal affect. Extremities: No clubbing or cyanosis.  HEENT: Normal.   I do not think we will be able to get him on Entresto .  Will try him on low dose valsartan  as outpatient.  Would keep Coreg  at 6.25 mg bid.  Euvolemic, no Lasix .   Would have him get up and walk this afternoon to make sure no longer orthostatic.   Should be ready for [facility] rehab tomorrow.   Ezra Shuck 09/26/2023 2:24 PM

## 2023-09-27 DIAGNOSIS — E1122 Type 2 diabetes mellitus with diabetic chronic kidney disease: Secondary | ICD-10-CM | POA: Diagnosis not present

## 2023-09-27 DIAGNOSIS — M109 Gout, unspecified: Secondary | ICD-10-CM | POA: Diagnosis not present

## 2023-09-27 DIAGNOSIS — D649 Anemia, unspecified: Secondary | ICD-10-CM | POA: Diagnosis not present

## 2023-09-27 DIAGNOSIS — R5381 Other malaise: Secondary | ICD-10-CM | POA: Diagnosis not present

## 2023-09-27 DIAGNOSIS — J9601 Acute respiratory failure with hypoxia: Secondary | ICD-10-CM | POA: Diagnosis not present

## 2023-09-27 DIAGNOSIS — E1169 Type 2 diabetes mellitus with other specified complication: Secondary | ICD-10-CM | POA: Diagnosis not present

## 2023-09-27 DIAGNOSIS — M4807 Spinal stenosis, lumbosacral region: Secondary | ICD-10-CM | POA: Diagnosis not present

## 2023-09-27 DIAGNOSIS — N1831 Chronic kidney disease, stage 3a: Secondary | ICD-10-CM | POA: Diagnosis not present

## 2023-09-27 DIAGNOSIS — R1319 Other dysphagia: Secondary | ICD-10-CM | POA: Diagnosis not present

## 2023-09-27 DIAGNOSIS — I959 Hypotension, unspecified: Secondary | ICD-10-CM | POA: Diagnosis not present

## 2023-09-27 DIAGNOSIS — K922 Gastrointestinal hemorrhage, unspecified: Secondary | ICD-10-CM | POA: Diagnosis not present

## 2023-09-27 DIAGNOSIS — Z743 Need for continuous supervision: Secondary | ICD-10-CM | POA: Diagnosis not present

## 2023-09-27 DIAGNOSIS — I255 Ischemic cardiomyopathy: Secondary | ICD-10-CM | POA: Diagnosis not present

## 2023-09-27 DIAGNOSIS — J99 Respiratory disorders in diseases classified elsewhere: Secondary | ICD-10-CM | POA: Diagnosis not present

## 2023-09-27 DIAGNOSIS — I5023 Acute on chronic systolic (congestive) heart failure: Secondary | ICD-10-CM | POA: Diagnosis not present

## 2023-09-27 DIAGNOSIS — M069 Rheumatoid arthritis, unspecified: Secondary | ICD-10-CM | POA: Diagnosis not present

## 2023-09-27 DIAGNOSIS — Z7401 Bed confinement status: Secondary | ICD-10-CM | POA: Diagnosis not present

## 2023-09-27 DIAGNOSIS — I2510 Atherosclerotic heart disease of native coronary artery without angina pectoris: Secondary | ICD-10-CM | POA: Diagnosis not present

## 2023-09-27 DIAGNOSIS — M0579 Rheumatoid arthritis with rheumatoid factor of multiple sites without organ or systems involvement: Secondary | ICD-10-CM | POA: Diagnosis not present

## 2023-09-27 DIAGNOSIS — Z8673 Personal history of transient ischemic attack (TIA), and cerebral infarction without residual deficits: Secondary | ICD-10-CM | POA: Diagnosis not present

## 2023-09-27 DIAGNOSIS — N179 Acute kidney failure, unspecified: Secondary | ICD-10-CM | POA: Diagnosis not present

## 2023-09-27 DIAGNOSIS — I5020 Unspecified systolic (congestive) heart failure: Secondary | ICD-10-CM | POA: Diagnosis not present

## 2023-09-27 DIAGNOSIS — I509 Heart failure, unspecified: Secondary | ICD-10-CM | POA: Diagnosis not present

## 2023-09-27 DIAGNOSIS — I5043 Acute on chronic combined systolic (congestive) and diastolic (congestive) heart failure: Secondary | ICD-10-CM | POA: Diagnosis not present

## 2023-09-27 DIAGNOSIS — I130 Hypertensive heart and chronic kidney disease with heart failure and stage 1 through stage 4 chronic kidney disease, or unspecified chronic kidney disease: Secondary | ICD-10-CM | POA: Diagnosis not present

## 2023-09-27 DIAGNOSIS — R278 Other lack of coordination: Secondary | ICD-10-CM | POA: Diagnosis not present

## 2023-09-27 DIAGNOSIS — Z955 Presence of coronary angioplasty implant and graft: Secondary | ICD-10-CM | POA: Diagnosis not present

## 2023-09-27 DIAGNOSIS — R531 Weakness: Secondary | ICD-10-CM | POA: Diagnosis not present

## 2023-09-27 DIAGNOSIS — I10 Essential (primary) hypertension: Secondary | ICD-10-CM | POA: Diagnosis not present

## 2023-09-27 DIAGNOSIS — Z7901 Long term (current) use of anticoagulants: Secondary | ICD-10-CM | POA: Diagnosis not present

## 2023-09-27 DIAGNOSIS — J9610 Chronic respiratory failure, unspecified whether with hypoxia or hypercapnia: Secondary | ICD-10-CM | POA: Diagnosis not present

## 2023-09-27 DIAGNOSIS — I480 Paroxysmal atrial fibrillation: Secondary | ICD-10-CM | POA: Diagnosis not present

## 2023-09-27 DIAGNOSIS — M6281 Muscle weakness (generalized): Secondary | ICD-10-CM | POA: Diagnosis not present

## 2023-09-27 LAB — BASIC METABOLIC PANEL
Anion gap: 9 (ref 5–15)
BUN: 38 mg/dL — ABNORMAL HIGH (ref 8–23)
CO2: 21 mmol/L — ABNORMAL LOW (ref 22–32)
Calcium: 9.1 mg/dL (ref 8.9–10.3)
Chloride: 105 mmol/L (ref 98–111)
Creatinine, Ser: 2.11 mg/dL — ABNORMAL HIGH (ref 0.61–1.24)
GFR, Estimated: 31 mL/min — ABNORMAL LOW (ref >60)
Glucose, Bld: 149 mg/dL — ABNORMAL HIGH (ref 70–99)
Potassium: 4.1 mmol/L (ref 3.5–5.1)
Sodium: 135 mmol/L (ref 135–145)

## 2023-09-27 LAB — CBC
HCT: 35.1 % — ABNORMAL LOW (ref 39.0–52.0)
Hemoglobin: 11.4 g/dL — ABNORMAL LOW (ref 13.0–17.0)
MCH: 30.3 pg (ref 26.0–34.0)
MCHC: 32.5 g/dL (ref 30.0–36.0)
MCV: 93.4 fL (ref 80.0–100.0)
Platelets: 265 10*3/uL (ref 150–400)
RBC: 3.76 MIL/uL — ABNORMAL LOW (ref 4.22–5.81)
RDW: 16 % — ABNORMAL HIGH (ref 11.5–15.5)
WBC: 8.3 10*3/uL (ref 4.0–10.5)
nRBC: 0 % (ref 0.0–0.2)

## 2023-09-27 MED ORDER — FUROSEMIDE 20 MG PO TABS: 20.0000 mg | ORAL_TABLET | Freq: Every day | ORAL | Status: DC | PRN

## 2023-09-27 MED ORDER — CARVEDILOL 6.25 MG PO TABS: 6.2500 mg | ORAL_TABLET | Freq: Two times a day (BID) | ORAL | Status: DC

## 2023-09-27 MED ORDER — FERROUS SULFATE 325 (65 FE) MG PO TABS: 325.0000 mg | ORAL_TABLET | Freq: Every day | ORAL | Status: AC

## 2023-09-27 MED ORDER — AMIODARONE HCL 200 MG PO TABS: 200.0000 mg | ORAL_TABLET | Freq: Every day | ORAL | Status: DC

## 2023-09-27 MED ORDER — ACETAMINOPHEN 325 MG PO TABS: 650.0000 mg | ORAL_TABLET | ORAL | Status: DC | PRN

## 2023-09-27 MED ORDER — DAPAGLIFLOZIN PROPANEDIOL 5 MG PO TABS: 10.0000 mg | ORAL_TABLET | Freq: Every day | ORAL | Status: DC

## 2023-09-27 MED ORDER — CLOPIDOGREL BISULFATE 75 MG PO TABS: 75.0000 mg | ORAL_TABLET | Freq: Every day | ORAL | Status: DC

## 2023-09-27 MED ORDER — ALLOPURINOL 100 MG PO TABS: 100.0000 mg | ORAL_TABLET | Freq: Two times a day (BID) | ORAL | Status: DC

## 2023-09-27 NOTE — TOC Transition Note (Signed)
 Transition of Care [facility]) - Discharge Note   Patient Details  Name: Connor Bryant MRN: [id number] Date of Birth: 1946-09-24  Transition of Care [facility]) CM/SW Contact:  Arlana JINNY Nicholaus ISRAEL Phone Number: [phone] 09/27/2023, 1:34 PM   Clinical Narrative:   HF CSW called and spoke with pts son to let him know that the pt will be transporting by [facility] today to [facility].   CSW called [facility] and placed facesheet and med necessity form on pts chart.   The number to call for report is [phone], room number 411-2.       Barriers to Discharge: Continued Medical Work up   Patient Goals and CMS Choice Patient states their goals for this hospitalization and ongoing recovery are:: wants to get better CMS Medicare.gov Compare Post Acute Care list provided to:: Patient        Discharge Placement                       Discharge Plan and Services Additional resources added to the After Visit Summary for     Discharge Planning Services: CM Consult Post Acute Care Choice: IP Rehab                    HH Arranged: PT, RN [facility] Agency: [facility] Date [facility] Agency Contacted: 09/12/23 Time HH Agency Contacted: [phone] Representative spoke with at [facility] Agency: Burnard Bucks  Social Drivers of Health (SDOH) Interventions SDOH Screenings   Food Insecurity: No Food Insecurity (09/11/2023)  Housing: Low Risk  (09/11/2023)  Transportation Needs: No Transportation Needs (09/11/2023)  Utilities: Not At Risk (09/11/2023)  Alcohol Screen: Low Risk  (08/19/2023)  Depression (PHQ2-9): Low Risk  (08/27/2023)  Financial Resource Strain: Low Risk  (08/19/2023)  Physical Activity: Insufficiently Active (12/03/2022)  Social Connections: Moderately Isolated (09/24/2023)  Stress: No Stress Concern Present (12/03/2022)  Tobacco Use: Medium Risk (09/12/2023)     Readmission Risk Interventions     No data to display

## 2023-09-27 NOTE — Progress Notes (Addendum)
 Advanced Heart Failure Rounding Note  Cardiologist: Alvan Carrier, MD  Chief Complaint: Acute on Chronic Systolic Heart Failure  Subjective:    12/23: PCI w DES x2 to LAD and RCA.   BP improved yesterday. 1hr of rate controlled Afib this am. NSR on tele.  Plan for discharge to [facility] rehab  Feeling well this am. No SOB, CP, or dizziness  Objective:    Weight Range: 77.8 kg Body mass index is 26.08 kg/m.   Vital Signs:   Temp:  [97.6 F (36.4 C)-97.9 F (36.6 C)] 97.6 F (36.4 C) (01/03 0731) Pulse Rate:  [66-70] 69 (01/03 0731) Resp:  [16-18] 17 (01/03 0400) BP: (93-127)/(52-77) 93/52 (01/03 0731) SpO2:  [95 %-100 %] 97 % (01/03 0731) Weight:  [77.8 kg] 77.8 kg (01/03 0500) Last BM Date : 09/24/22  Weight change: Filed Weights   09/25/23 0431 09/26/23 0642 09/27/23 0500  Weight: 77.9 kg 77.6 kg 77.8 kg   Intake/Output:   Intake/Output Summary (Last 24 hours) at 09/27/2023 0851 Last data filed at 09/27/2023 0734 Gross per 24 hour  Intake 243 ml  Output 800 ml  Net -557 ml   Physical Exam    General: Well appearing. No distress on RA HEENT: neck supple.   Cardiac: JVP ~6cm. S1 and S2 present. No murmurs or rub. Resp: Lung sounds clear and equal B/L Abdomen: Soft, non-tender, non-distended. + BS. Extremities: Warm and dry. No rash, cyanosis.  Trace edema in ankles.  Neuro: Alert and oriented x3. Affect pleasant. Moves all extremities without difficulty.  Telemetry   SR in 60-70s. In rate controlled AF from 7am-8:30am. (personally reviewed)  EKG    No new EKG to review  Labs    CBC Recent Labs    09/26/23 0208 09/27/23 0221  WBC 7.5 8.3  HGB 11.4* 11.4*  HCT 34.5* 35.1*  MCV 92.2 93.4  PLT 259 265    Basic Metabolic Panel Recent Labs    98/97/74 0208 09/27/23 0221  NA 133* 135  K 3.9 4.1  CL 103 105  CO2 18* 21*  GLUCOSE 155* 149*  BUN 39* 38*  CREATININE 2.22* 2.11*  CALCIUM  9.1 9.1   Liver Function Tests No results for  input(s): AST, ALT, ALKPHOS, BILITOT, PROT, ALBUMIN  in the last 72 hours.  No results for input(s): LIPASE, AMYLASE in the last 72 hours. Cardiac Enzymes No results for input(s): CKTOTAL, CKMB, CKMBINDEX, TROPONINI in the last 72 hours.  BNP: BNP (last 3 results) Recent Labs    08/19/23 0213 09/03/23 1621 09/11/23 1637  BNP 1,228.1* 1,602.8* 1,478.1*   ProBNP (last 3 results) No results for input(s): PROBNP in the last 8760 hours.  D-Dimer No results for input(s): DDIMER in the last 72 hours. Hemoglobin A1C No results for input(s): HGBA1C in the last 72 hours. Fasting Lipid Panel No results for input(s): CHOL, HDL, LDLCALC, TRIG, CHOLHDL, LDLDIRECT in the last 72 hours. Thyroid  Function Tests No results for input(s): TSH, T4TOTAL, T3FREE, THYROIDAB in the last 72 hours.  Invalid input(s): FREET3  Other results:  Imaging   No results found.  Medications:    Scheduled Medications:  allopurinol   100 mg Oral BID   amiodarone   200 mg Oral Daily   apixaban   5 mg Oral BID   atorvastatin   80 mg Oral Daily   clopidogrel   75 mg Oral Q breakfast   dapagliflozin  propanediol  10 mg Oral Daily   feeding supplement  237 mL Oral BID BM  ferrous sulfate   325 mg Oral Daily   folic acid   1 mg Oral Daily   Living Better with Heart Failure Book   Does not apply Once   pantoprazole   40 mg Oral BID   polyethylene glycol  17 g Oral Daily   pyridOXINE   50 mg Oral Daily   sodium chloride  flush  3 mL Intravenous Q12H   sodium chloride  flush  3 mL Intravenous Q12H   spironolactone   12.5 mg Oral Daily    Infusions:    PRN Medications: acetaminophen , docusate sodium , ondansetron  (ZOFRAN ) IV, sodium chloride  flush, sodium chloride  flush  Patient Profile   Connor Bryant is a 78 y.o. male with past medical history of CVA, persistent Afib, and chronic systolic heart failure.   Assessment/Plan   1. Acute on chronic systolic CHF:  Drop in EF in 8/21 to 35% with apical akinesis in setting CVA with improvement to normal EF by 10/21. This was thought to be due to stress cardiomyopathy.  Echo in 11/24 showed EF back down to EF 30-35% with diffuse hypokinesis, normal RV, PASP 60 mmHg, mild-moderate MR, moderate AS. This was in the setting of atrial fibrillation and anemia. I did not think that atrial fibrillation was the cause of this as rate was well-controlled (not tachycardia-mediated). LHC/RHC 12/18 showed critical proximal LAD and severe ostial RCA stenoses, suggesting an ischemic cardiomyopathy. Mildly elevated RA pressure, elevated PCWP with prominent v-waves, normal LVEDP. Simultaneous LV/PCWP pressures were not measured, but PCWP 29 and LVEDP 11 suggests gradient across mitral valve with significant mitral stenosis. Moderate pulmonary venous hypertension. Low but not markedly low cardiac output (CI 2.23 Fick, 2.03 thermo).  Now s/p PCA w DES x2 to LAD and RCA. - Echo 09/12/23: EF 30-35%, mod reduced RV function. Mild MR and Mod AS.  - Euvolemic on exam  - Continue Farxiga  10 mg  - Continue Spiro 12.5 mg daily. - Hypotensive with Entresto , BP too low to trial Losartan . - Continue Coreg  6.25 bid, decreased for orthostasis and hypotension - Repeat echo in 3 months, ?ICD.  2. CAD: Patient has not had any chest pain. He has had exertional dyspnea. Echo in 11/24 showed EF down to 30-35%. Cath 12/18 showed severe, heavily calcified proximal LAD stenosis after D1 and S1 (99%) with TIMI 2 flow down the distal LAD. Suspect 80% heavily calcified ostial RCA stenosis. Suspect ischemic cardiomyopathy.  - s/p PCI w DES x2 to LAD and RCA. Will need monotherapy antiplatelet with Plavix  (on Eliquis  for Afib) - Stable w/o CP  - Continue Plavix  - Continue statin daily 3. Atrial fibrillation: Persistent. He is back on Eliquis  after recent GI bleeding from erosive gastritis. He was started on amiodarone  at 11/24 admission. Episode of rate  controlled Afib the morning for 1hr. NSR on tele.  - on Eliquis . Hgb stable. - Continue amiodarone  200 mg daily - If he has further GI bleeding, would consider Watchman for him.  - Consider AF ablation in future.  4. CVA: 8/21, suspect due to atrial fibrillation.   5. PE: In 8/21, he is on Eliquis .  6. Aortic stenosis: AS looked moderate on 11/24 echo. S2 can be heard clearly. However, aortic valve was easily crossed on cath 09/11/23 and peak-to-peak gradient was only 8 mmHg.  - Mod stenosis present on Echo 09/12/23 7. PAD: H/o iliac stents and mesenteric artery bypass. No claudication or intestinal angina. No pedal ulcerations.  - Continue statin.  8. Carotid stenosis: 40-59% L ICA and 1-39% R ICA  stenosis on 09/12/23. 9. GI bleeding: 11/24 GI bleeding from erosive esophagitis. CBC has been stable with no overt bleeding.  - Avoid ETOH.  10. CKD stage 3b: sCr baseline ~1.4-2.0 (difficult to assess, fluctuant).  - sCr bumped with hypotension 1.97>2.22 11. Mitral stenosis: Mild mitral stenosis on 11/24 echo with mean gradient 5 mmHg.  On cath 09/11/23, simultaneous LV/PCWP pressures were not measured, but PCWP 29 and LVEDP 11 suggests gradient across mitral valve with significant mitral stenosis.  - no mitral stenosis noted on 12/24 echo. Mild MR. 12. Deconditioning - Has bed at [facility] of Stay: 16  Jordan Lee, NP  09/27/2023, 8:51 AM  Advanced Heart Failure Team Pager [phone] (M-F; 7a - 5p)  Please contact [facility] for night-coverage after hours (5p -7a ) and weekends on [email or web address]  Patient seen with NP, agree with the above note.   Creatinine stable at 2.11.  SBP 90s generally.  No dyspnea.   General: NAD Neck: No JVD, no thyromegaly or thyroid  nodule.  Lungs: Clear to auscultation bilaterally with normal respiratory effort. CV: Nondisplaced PMI.  Heart regular S1/S2, no S3/S4, no murmur.  No peripheral edema.   Abdomen: Soft, nontender, no hepatosplenomegaly, no  distention.  Skin: Intact without lesions or rashes.  Neurologic: Alert and oriented x 3.  Psych: Normal affect. Extremities: No clubbing or cyanosis.   OK for discharge to rehab today.  He will continue the current meds, no BP room for Entresto /ARB.   Ezra Shuck 09/27/2023 11:48 AM  HEENT: Normal.

## 2023-09-27 NOTE — TOC Progression Note (Signed)
 Transition of Care [facility]) - Progression Note    Patient Details  Name: Connor Bryant MRN: [id number] Date of Birth: January 03, 1946  Transition of Care [facility]) CM/SW Contact  Arlana JINNY Nicholaus ISRAEL Phone Number: [phone] 09/27/2023, 9:21 AM  Clinical Narrative:   HF CSW reached out to [place] from [facility] to let her know that the pt is dc today and we are just waiting for MD to sign off. CSW will prepare pt for dc.   TOC will continue following.     Expected Discharge Plan: IP Rehab Facility Barriers to Discharge: Continued Medical Work up  Expected Discharge Plan and Services   Discharge Planning Services: CM Consult Post Acute Care Choice: IP Rehab Living arrangements for the past 2 months: Single Family Home                           HH Arranged: PT, RN [facility] Agency: [facility] Date [facility] Agency Contacted: 09/12/23 Time HH Agency Contacted: [phone] Representative spoke with at [facility] Agency: Burnard Bucks   Social Determinants of Health (SDOH) Interventions SDOH Screenings   Food Insecurity: No Food Insecurity (09/11/2023)  Housing: Low Risk  (09/11/2023)  Transportation Needs: No Transportation Needs (09/11/2023)  Utilities: Not At Risk (09/11/2023)  Alcohol Screen: Low Risk  (08/19/2023)  Depression (PHQ2-9): Low Risk  (08/27/2023)  Financial Resource Strain: Low Risk  (08/19/2023)  Physical Activity: Insufficiently Active (12/03/2022)  Social Connections: Moderately Isolated (09/24/2023)  Stress: No Stress Concern Present (12/03/2022)  Tobacco Use: Medium Risk (09/12/2023)    Readmission Risk Interventions     No data to display

## 2023-09-27 NOTE — Progress Notes (Signed)
 AVS given to PTAR, Ivs removed, belongings gathered, report called to receiving facility Rn and patient was rolled out by [facility]

## 2023-09-30 ENCOUNTER — Ambulatory Visit (INDEPENDENT_AMBULATORY_CARE_PROVIDER_SITE_OTHER): Payer: Medicare Other

## 2023-09-30 ENCOUNTER — Other Ambulatory Visit: Payer: Self-pay | Admitting: *Deleted

## 2023-09-30 DIAGNOSIS — N179 Acute kidney failure, unspecified: Secondary | ICD-10-CM

## 2023-09-30 DIAGNOSIS — I255 Ischemic cardiomyopathy: Secondary | ICD-10-CM

## 2023-09-30 DIAGNOSIS — M109 Gout, unspecified: Secondary | ICD-10-CM | POA: Diagnosis not present

## 2023-09-30 DIAGNOSIS — I509 Heart failure, unspecified: Secondary | ICD-10-CM | POA: Diagnosis not present

## 2023-09-30 DIAGNOSIS — R5381 Other malaise: Secondary | ICD-10-CM | POA: Diagnosis not present

## 2023-09-30 DIAGNOSIS — I5020 Unspecified systolic (congestive) heart failure: Secondary | ICD-10-CM

## 2023-09-30 DIAGNOSIS — K922 Gastrointestinal hemorrhage, unspecified: Secondary | ICD-10-CM

## 2023-09-30 DIAGNOSIS — J9610 Chronic respiratory failure, unspecified whether with hypoxia or hypercapnia: Secondary | ICD-10-CM | POA: Diagnosis not present

## 2023-09-30 DIAGNOSIS — N1831 Chronic kidney disease, stage 3a: Secondary | ICD-10-CM

## 2023-09-30 DIAGNOSIS — M069 Rheumatoid arthritis, unspecified: Secondary | ICD-10-CM

## 2023-09-30 DIAGNOSIS — E1122 Type 2 diabetes mellitus with diabetic chronic kidney disease: Secondary | ICD-10-CM | POA: Diagnosis not present

## 2023-09-30 DIAGNOSIS — I959 Hypotension, unspecified: Secondary | ICD-10-CM

## 2023-09-30 DIAGNOSIS — I480 Paroxysmal atrial fibrillation: Secondary | ICD-10-CM | POA: Diagnosis not present

## 2023-09-30 DIAGNOSIS — I130 Hypertensive heart and chronic kidney disease with heart failure and stage 1 through stage 4 chronic kidney disease, or unspecified chronic kidney disease: Secondary | ICD-10-CM | POA: Diagnosis not present

## 2023-09-30 NOTE — Patient Outreach (Signed)
 Connor Bryant resides in [facility]. Screening for potential chronic care management services as a benefit of health plan and primary care provider.  Voicemail left and secure message sent to [place], SNF social worker to inquire about transition plans/needs.   Will continue to follow.   Pablo Hurst, MSN, RN, BSN [place]  [facility], Healthy Communities RN Post- Acute Care Manager Direct Dial: [phone]

## 2023-10-01 DIAGNOSIS — M0579 Rheumatoid arthritis with rheumatoid factor of multiple sites without organ or systems involvement: Secondary | ICD-10-CM | POA: Diagnosis not present

## 2023-10-01 DIAGNOSIS — K922 Gastrointestinal hemorrhage, unspecified: Secondary | ICD-10-CM | POA: Diagnosis not present

## 2023-10-01 DIAGNOSIS — N179 Acute kidney failure, unspecified: Secondary | ICD-10-CM | POA: Diagnosis not present

## 2023-10-01 DIAGNOSIS — R278 Other lack of coordination: Secondary | ICD-10-CM | POA: Diagnosis not present

## 2023-10-01 DIAGNOSIS — I2510 Atherosclerotic heart disease of native coronary artery without angina pectoris: Secondary | ICD-10-CM | POA: Diagnosis not present

## 2023-10-01 DIAGNOSIS — I480 Paroxysmal atrial fibrillation: Secondary | ICD-10-CM | POA: Diagnosis not present

## 2023-10-01 DIAGNOSIS — M6281 Muscle weakness (generalized): Secondary | ICD-10-CM | POA: Diagnosis not present

## 2023-10-01 DIAGNOSIS — D649 Anemia, unspecified: Secondary | ICD-10-CM | POA: Diagnosis not present

## 2023-10-01 DIAGNOSIS — I959 Hypotension, unspecified: Secondary | ICD-10-CM | POA: Diagnosis not present

## 2023-10-01 DIAGNOSIS — I5023 Acute on chronic systolic (congestive) heart failure: Secondary | ICD-10-CM | POA: Diagnosis not present

## 2023-10-01 DIAGNOSIS — M4807 Spinal stenosis, lumbosacral region: Secondary | ICD-10-CM | POA: Diagnosis not present

## 2023-10-01 DIAGNOSIS — J9601 Acute respiratory failure with hypoxia: Secondary | ICD-10-CM | POA: Diagnosis not present

## 2023-10-07 ENCOUNTER — Encounter: Payer: Medicare Other | Admitting: Internal Medicine

## 2023-10-09 DIAGNOSIS — M6281 Muscle weakness (generalized): Secondary | ICD-10-CM | POA: Diagnosis not present

## 2023-10-09 DIAGNOSIS — I2510 Atherosclerotic heart disease of native coronary artery without angina pectoris: Secondary | ICD-10-CM | POA: Diagnosis not present

## 2023-10-09 DIAGNOSIS — I480 Paroxysmal atrial fibrillation: Secondary | ICD-10-CM | POA: Diagnosis not present

## 2023-10-09 DIAGNOSIS — M109 Gout, unspecified: Secondary | ICD-10-CM | POA: Diagnosis not present

## 2023-10-09 DIAGNOSIS — I959 Hypotension, unspecified: Secondary | ICD-10-CM | POA: Diagnosis not present

## 2023-10-09 DIAGNOSIS — M0579 Rheumatoid arthritis with rheumatoid factor of multiple sites without organ or systems involvement: Secondary | ICD-10-CM | POA: Diagnosis not present

## 2023-10-09 DIAGNOSIS — K922 Gastrointestinal hemorrhage, unspecified: Secondary | ICD-10-CM | POA: Diagnosis not present

## 2023-10-09 DIAGNOSIS — I5023 Acute on chronic systolic (congestive) heart failure: Secondary | ICD-10-CM | POA: Diagnosis not present

## 2023-10-09 DIAGNOSIS — M4807 Spinal stenosis, lumbosacral region: Secondary | ICD-10-CM | POA: Diagnosis not present

## 2023-10-09 DIAGNOSIS — R278 Other lack of coordination: Secondary | ICD-10-CM | POA: Diagnosis not present

## 2023-10-09 DIAGNOSIS — D649 Anemia, unspecified: Secondary | ICD-10-CM | POA: Diagnosis not present

## 2023-10-09 DIAGNOSIS — N179 Acute kidney failure, unspecified: Secondary | ICD-10-CM | POA: Diagnosis not present

## 2023-10-12 DIAGNOSIS — Z7901 Long term (current) use of anticoagulants: Secondary | ICD-10-CM | POA: Diagnosis not present

## 2023-10-12 DIAGNOSIS — E1122 Type 2 diabetes mellitus with diabetic chronic kidney disease: Secondary | ICD-10-CM | POA: Diagnosis not present

## 2023-10-12 DIAGNOSIS — K922 Gastrointestinal hemorrhage, unspecified: Secondary | ICD-10-CM | POA: Diagnosis not present

## 2023-10-12 DIAGNOSIS — D500 Iron deficiency anemia secondary to blood loss (chronic): Secondary | ICD-10-CM | POA: Diagnosis not present

## 2023-10-12 DIAGNOSIS — I480 Paroxysmal atrial fibrillation: Secondary | ICD-10-CM | POA: Diagnosis not present

## 2023-10-12 DIAGNOSIS — I255 Ischemic cardiomyopathy: Secondary | ICD-10-CM | POA: Diagnosis not present

## 2023-10-12 DIAGNOSIS — I130 Hypertensive heart and chronic kidney disease with heart failure and stage 1 through stage 4 chronic kidney disease, or unspecified chronic kidney disease: Secondary | ICD-10-CM | POA: Diagnosis not present

## 2023-10-12 DIAGNOSIS — M069 Rheumatoid arthritis, unspecified: Secondary | ICD-10-CM | POA: Diagnosis not present

## 2023-10-12 DIAGNOSIS — E785 Hyperlipidemia, unspecified: Secondary | ICD-10-CM | POA: Diagnosis not present

## 2023-10-12 DIAGNOSIS — J9610 Chronic respiratory failure, unspecified whether with hypoxia or hypercapnia: Secondary | ICD-10-CM | POA: Diagnosis not present

## 2023-10-12 DIAGNOSIS — N179 Acute kidney failure, unspecified: Secondary | ICD-10-CM | POA: Diagnosis not present

## 2023-10-12 DIAGNOSIS — M109 Gout, unspecified: Secondary | ICD-10-CM | POA: Diagnosis not present

## 2023-10-12 DIAGNOSIS — I5020 Unspecified systolic (congestive) heart failure: Secondary | ICD-10-CM | POA: Diagnosis not present

## 2023-10-12 DIAGNOSIS — I959 Hypotension, unspecified: Secondary | ICD-10-CM | POA: Diagnosis not present

## 2023-10-12 DIAGNOSIS — Z7984 Long term (current) use of oral hypoglycemic drugs: Secondary | ICD-10-CM | POA: Diagnosis not present

## 2023-10-12 DIAGNOSIS — N1831 Chronic kidney disease, stage 3a: Secondary | ICD-10-CM | POA: Diagnosis not present

## 2023-10-14 ENCOUNTER — Encounter (HOSPITAL_COMMUNITY): Payer: Medicare Other | Admitting: Cardiology

## 2023-10-15 ENCOUNTER — Telehealth: Payer: Self-pay

## 2023-10-15 ENCOUNTER — Ambulatory Visit: Payer: Medicare Other | Admitting: Family Medicine

## 2023-10-15 ENCOUNTER — Encounter: Payer: Self-pay | Admitting: Family Medicine

## 2023-10-15 VITALS — BP 122/64 | HR 56 | Temp 97.4°F | Ht 68.0 in | Wt 181.0 lb

## 2023-10-15 DIAGNOSIS — E756 Lipid storage disorder, unspecified: Secondary | ICD-10-CM | POA: Diagnosis not present

## 2023-10-15 DIAGNOSIS — E1169 Type 2 diabetes mellitus with other specified complication: Secondary | ICD-10-CM

## 2023-10-15 DIAGNOSIS — E1122 Type 2 diabetes mellitus with diabetic chronic kidney disease: Secondary | ICD-10-CM | POA: Diagnosis not present

## 2023-10-15 DIAGNOSIS — I2510 Atherosclerotic heart disease of native coronary artery without angina pectoris: Secondary | ICD-10-CM | POA: Diagnosis not present

## 2023-10-15 MED ORDER — APIXABAN 5 MG PO TABS: 5.0000 mg | ORAL_TABLET | Freq: Two times a day (BID) | ORAL | 3 refills | Status: DC

## 2023-10-15 MED ORDER — ATORVASTATIN CALCIUM 80 MG PO TABS: 80.0000 mg | ORAL_TABLET | Freq: Every day | ORAL | 3 refills | Status: AC

## 2023-10-15 NOTE — Telephone Encounter (Signed)
Copied from CRM [id number]. Topic: Clinical - Home Health Verbal Orders >> Oct 15, 2023 12:00 PM Elle L wrote: Caller/Agency: Maria with [facility]  Callback Number: [phone] Service Requested: Home Health Frequency: One time a week for one week, then twice a week for two weeks, and then one time a week for one week. Any new concerns about the patient? No

## 2023-10-15 NOTE — Progress Notes (Signed)
Subjective:  Patient ID: Connor Bryant, male    DOB: Aug 27, 1946  Age: 78 y.o. MRN: [id number]  CC: Hospitalization Follow-up (Pt is still in rehad but at home now. /Weak in the hips and quads still. At least able to walk now with walker. )   HPI Connor Bryant presents for recheck of CHF. Was also anemic. Got 2 transfusions at [facility]. Then he was transferred to [facility]. Had cath with 2 stents. Due to weakness Went to rehab after for 2 weeks. Dced home last week. Had dyspnea, first week of rehab. Getting tired easily with his rehab regimen. Has home rehab working with him as well.  He had some esophageal bleeding that is likely the source of the blood loss.      10/15/2023    1:26 PM 08/27/2023    2:09 PM 08/01/2023    4:02 PM  Depression screen PHQ 2/9  Decreased Interest 1 0 1  Down, Depressed, Hopeless 1 0 1  PHQ - 2 Score 2 0 2  Altered sleeping 1  0  Tired, decreased energy 1  1  Change in appetite 1  0  Feeling bad or failure about yourself  1  0  Trouble concentrating 1  0  Moving slowly or fidgety/restless 1  0  Suicidal thoughts 1  0  PHQ-9 Score 9  3  Difficult doing work/chores Somewhat difficult  Somewhat difficult    History Connor Bryant has a past medical history of Acute CVA (cerebrovascular accident) (HCC) (05/01/2020), Acute respiratory failure with hypoxia (HCC) (05/04/2020), Arthritis, DM type 2 (diabetes mellitus, type 2) (HCC), Endotracheally intubated, Gallstone, Heart murmur, HTN (hypertension), Hyperlipidemia, Personal history of colonic polyp-adenoma (11/24/2013), Renal insufficiency, and Thoracic spine fracture (HCC).   He has a past surgical history that includes Debridement tennis elbow (Right); Tonsillectomy and adenoidectomy; Colonoscopy; Esophagogastroduodenoscopy; Mesenteric artery bypass (N/A, 03/10/2014); abdominal aortagram (N/A, 02/23/2014); vascualr surgery; Cataract extraction, bilateral; Esophagogastroduodenoscopy (egd) with propofol (N/A, 08/20/2023);  biopsy (08/20/2023); RIGHT/LEFT HEART CATH AND CORONARY ANGIOGRAPHY (N/A, 09/11/2023); CORONARY STENT INTERVENTION (N/A, 09/16/2023); CORONARY ATHERECTOMY (N/A, 09/16/2023); Coronary Ultrasound/IVUS (N/A, 09/16/2023); and CORONARY LITHOTRIPSY (N/A, 09/16/2023).   His family history includes AAA (abdominal aortic aneurysm) in his mother; Bladder Cancer in his father; Cancer in his father; Coronary artery disease in his father; Diabetes in his father and mother; Heart attack in his father; Heart disease in his father and mother; Hyperlipidemia in his father and mother; Hypertension in his father and mother; Stroke in his father.He reports that he quit smoking about 14 years ago. His smoking use included cigarettes. He has been exposed to tobacco smoke. He has never used smokeless tobacco. He reports that he does not currently use alcohol. He reports that he does not currently use drugs.    ROS Review of Systems  Constitutional:  Negative for fever.  Respiratory:  Negative for shortness of breath.   Cardiovascular:  Negative for chest pain.  Musculoskeletal:  Negative for arthralgias.  Skin:  Negative for rash.    Objective:  BP 122/64   Pulse (!) 56   Temp (!) 97.4 F (36.3 C)   Ht 5\' 8"  (1.727 m)   Wt 181 lb (82.1 kg)   SpO2 99%   BMI 27.52 kg/m   BP Readings from Last 3 Encounters:  10/15/23 122/64  09/27/23 (!) 106/56  09/03/23 132/68    Wt Readings from Last 3 Encounters:  10/15/23 181 lb (82.1 kg)  09/27/23 171 lb 8.3 oz (77.8 kg)  09/03/23 192 lb (87.1 kg)     Physical Exam Vitals reviewed.  Constitutional:      Appearance: He is well-developed.  HENT:     Head: Normocephalic and atraumatic.     Right Ear: External ear normal.     Left Ear: External ear normal.     Mouth/Throat:     Pharynx: No oropharyngeal exudate or posterior oropharyngeal erythema.  Eyes:     Pupils: Pupils are equal, round, and reactive to light.  Cardiovascular:     Rate and Rhythm:  Normal rate and regular rhythm.     Heart sounds: No murmur heard. Pulmonary:     Effort: No respiratory distress.     Breath sounds: Normal breath sounds.  Musculoskeletal:     Cervical back: Normal range of motion and neck supple.  Neurological:     Mental Status: He is alert and oriented to person, place, and time.       Assessment & Plan:   Connor "Gabriel Bryant" was seen today for hospitalization follow-up.  Diagnoses and all orders for this visit:  ASCVD (arteriosclerotic cardiovascular disease)  Type 2 diabetes mellitus with chronic kidney disease, without long-term current use of insulin, unspecified CKD stage (HCC) -     atorvastatin (LIPITOR) 80 MG tablet; Take 1 tablet (80 mg total) by mouth daily.  Diabetic lipidosis (HCC) -     atorvastatin (LIPITOR) 80 MG tablet; Take 1 tablet (80 mg total) by mouth daily.  Other orders -     apixaban (ELIQUIS) 5 MG TABS tablet; Take 1 tablet (5 mg total) by mouth 2 (two) times daily.       I have discontinued Connor Bryant "Connor Bryant"'s glimepiride. I am also having him maintain his Blood Glucose Monitoring Suppl, pantoprazole, spironolactone, Calcium Carb-Cholecalciferol (CALCIUM 500 + D PO), allopurinol, acetaminophen, amiodarone, ferrous sulfate, clopidogrel, carvedilol, dapagliflozin propanediol, furosemide, apixaban, and atorvastatin.  Allergies as of 10/15/2023   No Known Allergies      Medication List        Accurate as of October 15, 2023  8:43 PM. If you have any questions, ask your nurse or doctor.          STOP taking these medications    glimepiride 2 MG tablet Commonly known as: AMARYL Stopped by: Tinzley Dalia       TAKE these medications    acetaminophen 325 MG tablet Commonly known as: TYLENOL Take 2 tablets (650 mg total) by mouth every 4 (four) hours as needed for headache or mild pain (pain score 1-3).   allopurinol 100 MG tablet Commonly known as: ZYLOPRIM Take 1 tablet (100 mg total) by mouth  2 (two) times daily.   amiodarone 200 MG tablet Commonly known as: PACERONE Take 1 tablet (200 mg total) by mouth daily.   apixaban 5 MG Tabs tablet Commonly known as: Eliquis Take 1 tablet (5 mg total) by mouth 2 (two) times daily.   atorvastatin 80 MG tablet Commonly known as: LIPITOR Take 1 tablet (80 mg total) by mouth daily.   Blood Glucose Monitoring Suppl Devi 1 each by Does not apply route in the morning, at noon, and at bedtime. May substitute to any manufacturer covered by patient's insurance.   CALCIUM 500 + D PO Take 1 tablet by mouth daily.   carvedilol 6.25 MG tablet Commonly known as: COREG Take 1 tablet (6.25 mg total) by mouth 2 (two) times daily with a meal.   clopidogrel 75 MG tablet Commonly known as:  PLAVIX Take 1 tablet (75 mg total) by mouth daily with breakfast.   dapagliflozin propanediol 5 MG Tabs tablet Commonly known as: Farxiga Take 2 tablets (10 mg total) by mouth daily.   ferrous sulfate 325 (65 FE) MG tablet Take 1 tablet (325 mg total) by mouth daily.   furosemide 20 MG tablet Commonly known as: Lasix Take 1 tablet (20 mg total) by mouth daily as needed (for weight gain and swelling).   pantoprazole 40 MG tablet Commonly known as: PROTONIX Take 1 tablet (40 mg total) by mouth 2 (two) times daily.   spironolactone 25 MG tablet Commonly known as: ALDACTONE Take 0.5 tablets (12.5 mg total) by mouth daily.         Follow-up: Return in about 6 weeks (around 11/26/2023).  Mechele Claude, M.D.

## 2023-10-15 NOTE — Telephone Encounter (Signed)
TC back to [facility] PT w/ [facility], VO given for PT frequency.

## 2023-10-16 ENCOUNTER — Other Ambulatory Visit: Payer: Self-pay | Admitting: *Deleted

## 2023-10-16 DIAGNOSIS — I5022 Chronic systolic (congestive) heart failure: Secondary | ICD-10-CM

## 2023-10-16 NOTE — Patient Outreach (Addendum)
Post-Acute Care Manager follow up. Per [facility] Mr. Condry discharged from [facility] on 10/11/23. He was active with [facility] TOC prior to admission.   Secure communication sent to [employer] social work team to inquire about home health arrangements.   Will re-refer to [facility] chronic care management team as benefit of health plan and PCP.   Addendum: Update received from [name] Admissions Director. [facility] was arranged.   Raiford Noble, MSN, RN, BSN [place]  [facility], Healthy Communities RN Post- Acute Care Manager Direct Dial: [phone]

## 2023-10-17 ENCOUNTER — Telehealth: Payer: Self-pay | Admitting: *Deleted

## 2023-10-17 NOTE — Progress Notes (Signed)
Complex Care Management Note  Care Guide Note 10/17/2023 Name: JAPHETH BONNIWELL MRN: [id number] DOB: [birth year]  KERVIN SWINGLER is a 78 y.o. year old male who sees Stacks, Broadus John, MD for primary care. I reached out to [name] by phone today to offer complex care management services.  Mr. Yant was given information about Complex Care Management services today including:   The Complex Care Management services include support from the care team which includes your Nurse Coordinator, Clinical Social Worker, or Pharmacist.  The Complex Care Management team is here to help remove barriers to the health concerns and goals most important to you. Complex Care Management services are voluntary, and the patient may decline or stop services at any time by request to their care team member.   Complex Care Management Consent Status: Patient agreed to services and verbal consent obtained.   Follow up plan:  Telephone appointment with complex care management team member scheduled for:  10/21/23  Encounter Outcome:  Patient Scheduled  Gwenevere Ghazi  [facility]  [facility], [facility] Guide  Direct Dial: [phone]  Fax [phone]

## 2023-10-21 ENCOUNTER — Ambulatory Visit: Payer: Self-pay | Admitting: *Deleted

## 2023-10-21 NOTE — Patient Instructions (Addendum)
Visit Information  Thank you for taking time to visit with me today. Please don't hesitate to contact me if I can be of assistance to you.   Following are the goals we discussed today:   Goals Addressed             This Visit's Progress    home mangagement of congestive Heart Failure (CHF) & Atrial fibrillation (Afib)- care management services   On track    Interventions Today    Flowsheet Row Most Recent Value  Chronic Disease   Chronic disease during today's visit --  [Initial, HH services status, CHF/wt/swelling, afib +]  General Interventions   General Interventions Discussed/Reviewed General Interventions Discussed, Durable Medical Equipment (DME), [employer], Doctor Visits, Communication with  Doctor Visits Discussed/Reviewed Doctor Visits Discussed, PCP  Horticulturist, commercial (DME) Dan Humphreys, Other  [eyeglasses, scales]  PCP/Specialist Visits Compliance with follow-up visit  Communication with PCP/Specialists  [outreach to [facility] to attempt to find the status of home health services]  Exercise Interventions   Exercise Discussed/Reviewed Exercise Discussed, Physical Activity, Weight Managment, Assistive device use and maintanence  Physical Activity Discussed/Reviewed Physical Activity Discussed, Home Exercise Program (HEP), Types of exercise  [assessed/reviewed home physical activity & weight managment (CHF)]  Weight Management Weight maintenance  [discussed importance in monitoring weight to manage CHF(congestive heart failure)]  Education Interventions   Education Provided Provided Web-based Education, Provided Education  [Web based education provided for atrial fibrillation, "cold" /viral respiratory infection, heart failure: how to manage]  Provided Verbal Education On Nutrition, Exercise, Development worker, community, [employer]  [united healthcare (UHC) u card, UHC pots discharge meals, Home health services, Importance of home scale weight baseline  vs office weight value]  Mental Health Interventions   Mental Health Discussed/Reviewed Mental Health Discussed, Coping Strategies  Nutrition Interventions   Nutrition Discussed/Reviewed Nutrition Discussed, Fluid intake, Decreasing salt  Pharmacy Interventions   Pharmacy Dicussed/Reviewed Pharmacy Topics Discussed, Medications and their functions, Affording Medications  Safety Interventions   Safety Discussed/Reviewed Safety Discussed, Home Safety  Home Safety Assistive Devices              Our next appointment is by telephone on 12/09/23 at 3:15 pm  Please call the care guide team at [phone] if you need to cancel or reschedule your appointment.   If you are experiencing a Mental Health or Behavioral Health Crisis or need someone to talk to, please call the Suicide and Crisis Lifeline: 988 call the [place] National Suicide Prevention Lifeline: 639-017-0060 or TTY: 757-788-4970 TTY 939-472-8069) to talk to a trained counselor call 1-800-273-TALK (toll free, 24 hour hotline) call the [facility]: (619)323-0665 call 911   Patient verbalizes understanding of instructions and care plan provided today and agrees to view in MyChart. Active MyChart status and patient understanding of how to access instructions and care plan via MyChart confirmed with patient.     The patient has been provided with contact information for the care management team and has been advised to call with any health related questions or concerns.   Hashim Eichhorst L. Noelle Penner, RN, BSN, CCM [place]  [facility], [facility] RN Care Manager Direct Dial: [phone]  Fax: [phone] Mailing Address: [address]  [place] [address] Website: [email or web address]

## 2023-10-21 NOTE — Patient Outreach (Addendum)
Care Coordination   Initial Visit Note   10/21/2023 Name: Connor Bryant MRN: [id number] DOB: [birth year]  Connor Bryant is a 78 y.o. year old male who sees Stacks, Broadus John, MD for primary care. I spoke with  [name] by phone today.  What matters to the patients health and wellness today?   Initial, HH services status, CHF/wt/swelling, Afib + Home after a [place] admission & [place] facility stay during the recent 2024-2025 holidays. He reports being glad to being home. He confirms being given permission [place] drive [place] roads [place] speed of 45 mph or less by his pcp.  Walking with walker for unsteady safety purposes Able to complete  Activities of daily living (ADLs) &  IADLs (instrumental Activities of Daily Living) cooks, drive+ Today was the first time he reports he was able to get in and shower. He voiced appreciation.    Pending [facility] visits - had an evaluation but has not heard from staff since the evaluation  Seen by pcp for follow up on 10/15/23  "Head cold" symptoms- sneezing, no cough  Gastrointestinal bleed(GIB) Upper endoscopy found an abrasion in esophagus- "slow bleed" Anemia-last hemoglobin= 11.4 was down to 10.9 during admission- taking iron tabs. Normal range 13-18 discussed. Denies unusual fatigue   congestive Heart Failure (CHF) Swelling ankle- "a little" 173 lbs= SNF weight Weight at pcp visit at 181 lbs but he had on clothes & noted 2 different scales indicated 2 different weights- need to weigh at home Need to go pick up his ordered lasix,  Confirms any swelling noted during the day is noted to be resolved upon getting out of his bed in the morning  Atrial fibrillation (Afib)  no signs and symptoms (s/s) since conversion He symptom noted when Afib is worsening is audible sounds in his ear not feelings in his chest  Heart murmur - he is aware that the heart murmur is caused by calcium build up on one valve + the calcium is not  build up enough for need of surgery   Result of [facility] to [facility] resumption of care found by staff to have been noted for 10/12/23 - Transferred to [place], Development worker, international aid- A voice message was left requesting a return call to RN CM or patient   Goals Addressed             This Visit's Progress    home mangagement of congestive Heart Failure (CHF) & Atrial fibrillation (Afib)- care management services   On track    Interventions Today    Flowsheet Row Most Recent Value  Chronic Disease   Chronic disease during today's visit --  [Initial, HH services status, CHF/wt/swelling, afib +]  General Interventions   General Interventions Discussed/Reviewed General Interventions Discussed, Durable Medical Equipment (DME), [employer], Doctor Visits, Communication with  Doctor Visits Discussed/Reviewed Doctor Visits Discussed, PCP  Horticulturist, commercial (DME) Dan Humphreys, Other  [eyeglasses, scales]  PCP/Specialist Visits Compliance with follow-up visit  Communication with PCP/Specialists  [outreach to [facility] to attempt to find the status of home health services]  Exercise Interventions   Exercise Discussed/Reviewed Exercise Discussed, Physical Activity, Weight Managment, Assistive device use and maintanence  Physical Activity Discussed/Reviewed Physical Activity Discussed, Home Exercise Program (HEP), Types of exercise  [assessed/reviewed home physical activity & weight managment (CHF)]  Weight Management Weight maintenance  [discussed importance in monitoring weight to manage CHF(congestive heart failure)]  Education Interventions  Education Provided Provided Web-based Education, Provided Education  [Web based education provided for atrial fibrillation, "cold" /viral respiratory infection, heart failure: how to manage]  Provided Verbal Education On Nutrition, Exercise, Development worker, community, [employer]  [united healthcare (UHC) u card, UHC  pots discharge meals, Home health services, Importance of home scale weight baseline vs office weight value]  Mental Health Interventions   Mental Health Discussed/Reviewed Mental Health Discussed, Coping Strategies  Nutrition Interventions   Nutrition Discussed/Reviewed Nutrition Discussed, Fluid intake, Decreasing salt  Pharmacy Interventions   Pharmacy Dicussed/Reviewed Pharmacy Topics Discussed, Medications and their functions, Affording Medications  Safety Interventions   Safety Discussed/Reviewed Safety Discussed, Home Safety  Home Safety Assistive Devices              SDOH assessments and interventions completed:  Yes  SDOH Interventions Today    Flowsheet Row Most Recent Value  SDOH Interventions   Food Insecurity Interventions Intervention Not Indicated  Housing Interventions Intervention Not Indicated  Transportation Interventions Intervention Not Indicated        Care Coordination Interventions:  Yes, provided   Follow up plan: Follow up call scheduled for 11/11/23    Encounter Outcome:  Patient Visit Completed   Cala Bradford L. Noelle Penner, RN, BSN, CCM [place]  [facility], [facility] RN Care Manager Direct Dial: [phone]  Fax: [phone] Mailing Address: [address]  [place] [address] Website: [email or web address]

## 2023-10-29 ENCOUNTER — Telehealth: Payer: Self-pay | Admitting: Family Medicine

## 2023-10-29 DIAGNOSIS — M069 Rheumatoid arthritis, unspecified: Secondary | ICD-10-CM | POA: Diagnosis not present

## 2023-10-29 DIAGNOSIS — K922 Gastrointestinal hemorrhage, unspecified: Secondary | ICD-10-CM | POA: Diagnosis not present

## 2023-10-29 DIAGNOSIS — I959 Hypotension, unspecified: Secondary | ICD-10-CM | POA: Diagnosis not present

## 2023-10-29 DIAGNOSIS — I480 Paroxysmal atrial fibrillation: Secondary | ICD-10-CM | POA: Diagnosis not present

## 2023-10-29 DIAGNOSIS — I5020 Unspecified systolic (congestive) heart failure: Secondary | ICD-10-CM | POA: Diagnosis not present

## 2023-10-29 DIAGNOSIS — E1122 Type 2 diabetes mellitus with diabetic chronic kidney disease: Secondary | ICD-10-CM | POA: Diagnosis not present

## 2023-10-29 DIAGNOSIS — J9610 Chronic respiratory failure, unspecified whether with hypoxia or hypercapnia: Secondary | ICD-10-CM | POA: Diagnosis not present

## 2023-10-29 DIAGNOSIS — Z7901 Long term (current) use of anticoagulants: Secondary | ICD-10-CM | POA: Diagnosis not present

## 2023-10-29 DIAGNOSIS — N179 Acute kidney failure, unspecified: Secondary | ICD-10-CM | POA: Diagnosis not present

## 2023-10-29 DIAGNOSIS — M109 Gout, unspecified: Secondary | ICD-10-CM | POA: Diagnosis not present

## 2023-10-29 DIAGNOSIS — I130 Hypertensive heart and chronic kidney disease with heart failure and stage 1 through stage 4 chronic kidney disease, or unspecified chronic kidney disease: Secondary | ICD-10-CM | POA: Diagnosis not present

## 2023-10-29 DIAGNOSIS — N1831 Chronic kidney disease, stage 3a: Secondary | ICD-10-CM | POA: Diagnosis not present

## 2023-10-29 DIAGNOSIS — D500 Iron deficiency anemia secondary to blood loss (chronic): Secondary | ICD-10-CM | POA: Diagnosis not present

## 2023-10-29 DIAGNOSIS — E785 Hyperlipidemia, unspecified: Secondary | ICD-10-CM | POA: Diagnosis not present

## 2023-10-29 DIAGNOSIS — I255 Ischemic cardiomyopathy: Secondary | ICD-10-CM | POA: Diagnosis not present

## 2023-10-29 DIAGNOSIS — Z7984 Long term (current) use of oral hypoglycemic drugs: Secondary | ICD-10-CM | POA: Diagnosis not present

## 2023-10-29 NOTE — Telephone Encounter (Signed)
Verbal given to [facility]

## 2023-10-29 NOTE — Telephone Encounter (Signed)
 Copied from CRM [id number]. Topic: Clinical - Home Health Verbal Orders >> Oct 29, 2023  2:31 PM Curlee DEL wrote: Caller/Agency: Harlene, Physical therapist with [facility] Callback Number: [phone] - Genna to leave a messsage Service Requested: Physical Therapy Frequency: Once a week for 4 weeks Any new concerns about the patient? No

## 2023-11-05 DIAGNOSIS — D500 Iron deficiency anemia secondary to blood loss (chronic): Secondary | ICD-10-CM | POA: Diagnosis not present

## 2023-11-05 DIAGNOSIS — J9610 Chronic respiratory failure, unspecified whether with hypoxia or hypercapnia: Secondary | ICD-10-CM | POA: Diagnosis not present

## 2023-11-05 DIAGNOSIS — I480 Paroxysmal atrial fibrillation: Secondary | ICD-10-CM | POA: Diagnosis not present

## 2023-11-05 DIAGNOSIS — I130 Hypertensive heart and chronic kidney disease with heart failure and stage 1 through stage 4 chronic kidney disease, or unspecified chronic kidney disease: Secondary | ICD-10-CM | POA: Diagnosis not present

## 2023-11-05 DIAGNOSIS — E785 Hyperlipidemia, unspecified: Secondary | ICD-10-CM | POA: Diagnosis not present

## 2023-11-05 DIAGNOSIS — N1831 Chronic kidney disease, stage 3a: Secondary | ICD-10-CM | POA: Diagnosis not present

## 2023-11-05 DIAGNOSIS — E1122 Type 2 diabetes mellitus with diabetic chronic kidney disease: Secondary | ICD-10-CM | POA: Diagnosis not present

## 2023-11-05 DIAGNOSIS — I959 Hypotension, unspecified: Secondary | ICD-10-CM | POA: Diagnosis not present

## 2023-11-05 DIAGNOSIS — Z955 Presence of coronary angioplasty implant and graft: Secondary | ICD-10-CM | POA: Diagnosis not present

## 2023-11-05 DIAGNOSIS — Z7901 Long term (current) use of anticoagulants: Secondary | ICD-10-CM | POA: Diagnosis not present

## 2023-11-05 DIAGNOSIS — Z7984 Long term (current) use of oral hypoglycemic drugs: Secondary | ICD-10-CM | POA: Diagnosis not present

## 2023-11-05 DIAGNOSIS — M109 Gout, unspecified: Secondary | ICD-10-CM | POA: Diagnosis not present

## 2023-11-05 DIAGNOSIS — I5023 Acute on chronic systolic (congestive) heart failure: Secondary | ICD-10-CM | POA: Diagnosis not present

## 2023-11-05 DIAGNOSIS — M4800 Spinal stenosis, site unspecified: Secondary | ICD-10-CM | POA: Diagnosis not present

## 2023-11-05 DIAGNOSIS — I2510 Atherosclerotic heart disease of native coronary artery without angina pectoris: Secondary | ICD-10-CM | POA: Diagnosis not present

## 2023-11-05 DIAGNOSIS — D631 Anemia in chronic kidney disease: Secondary | ICD-10-CM | POA: Diagnosis not present

## 2023-11-05 DIAGNOSIS — I252 Old myocardial infarction: Secondary | ICD-10-CM | POA: Diagnosis not present

## 2023-11-05 DIAGNOSIS — M0579 Rheumatoid arthritis with rheumatoid factor of multiple sites without organ or systems involvement: Secondary | ICD-10-CM | POA: Diagnosis not present

## 2023-11-11 ENCOUNTER — Ambulatory Visit: Payer: Self-pay | Admitting: *Deleted

## 2023-11-11 NOTE — Patient Outreach (Incomplete)
  Care Coordination   Follow Up Visit Note   11/13/2023 Name: MC BLOODWORTH MRN: [id number] DOB: 1945-11-04  Connor Bryant is a 78 y.o. year old male who sees Stacks, Broadus John, MD for primary care. I spoke with  [name] by phone today.  What matters to the patients health and wellness today?  f/u [facility], CHF weight, Atrial fibrillation Weight 183 lbs  Afibrillation - denies worsening symptoms [facility] continues with services. Now able to be mobile without walker, going out to eat Has a quad cane and a standard cane, to get shower upgraded May need some medicine refills    Goals Addressed             This Visit's Progress    home mangagement of congestive Heart Failure (CHF) & Atrial fibrillation (Afib)- care management services   On track    Interventions Today    Flowsheet Row Most Recent Value  Chronic Disease   Chronic disease during today's visit Atrial Fibrillation (AFib), Congestive Heart Failure (CHF)  General Interventions   General Interventions Discussed/Reviewed General Interventions Reviewed, Durable Medical Equipment (DME), Doctor Visits  Doctor Visits Discussed/Reviewed Doctor Visits Reviewed, PCP  Durable Medical Equipment (DME) Other  [has a quad cane at home Reports now without his walker He would feel safer with the quad cane vs a standard cane]  PCP/Specialist Visits Compliance with follow-up visit  Exercise Interventions   Exercise Discussed/Reviewed Exercise Reviewed, Physical Activity  [confirmed he is able to walk safely without his walker, Has been out to eat. Improvement]  Physical Activity Discussed/Reviewed Physical Activity Reviewed  Education Interventions   Education Provided Provided Education  [refilling medicines, suggest a quad cane vs a standard one]  Safety Interventions   Safety Discussed/Reviewed Safety Reviewed, Fall Risk, Home Safety  Home Safety Assistive Devices  Advanced Directive Interventions    Advanced Directives Discussed/Reviewed Advanced Directives Discussed  [no preference for advance directives]              SDOH assessments and interventions completed:  No     Care Coordination Interventions:  Yes, provided   Follow up plan: Follow up call scheduled for 01/11/24    Encounter Outcome:  Patient Visit Completed   Cala Bradford L. Noelle Penner, RN, BSN, CCM [place]  [facility], [facility] Health RN Care Manager Direct Dial: [phone]  Fax: [phone] Mailing Address: [address]  [place] [address] Website: [email or web address]

## 2023-11-12 DIAGNOSIS — D631 Anemia in chronic kidney disease: Secondary | ICD-10-CM | POA: Diagnosis not present

## 2023-11-12 DIAGNOSIS — Z955 Presence of coronary angioplasty implant and graft: Secondary | ICD-10-CM | POA: Diagnosis not present

## 2023-11-12 DIAGNOSIS — M4800 Spinal stenosis, site unspecified: Secondary | ICD-10-CM | POA: Diagnosis not present

## 2023-11-12 DIAGNOSIS — Z7901 Long term (current) use of anticoagulants: Secondary | ICD-10-CM | POA: Diagnosis not present

## 2023-11-12 DIAGNOSIS — M0579 Rheumatoid arthritis with rheumatoid factor of multiple sites without organ or systems involvement: Secondary | ICD-10-CM | POA: Diagnosis not present

## 2023-11-12 DIAGNOSIS — Z7984 Long term (current) use of oral hypoglycemic drugs: Secondary | ICD-10-CM | POA: Diagnosis not present

## 2023-11-12 DIAGNOSIS — I130 Hypertensive heart and chronic kidney disease with heart failure and stage 1 through stage 4 chronic kidney disease, or unspecified chronic kidney disease: Secondary | ICD-10-CM | POA: Diagnosis not present

## 2023-11-12 DIAGNOSIS — J9610 Chronic respiratory failure, unspecified whether with hypoxia or hypercapnia: Secondary | ICD-10-CM | POA: Diagnosis not present

## 2023-11-12 DIAGNOSIS — I2510 Atherosclerotic heart disease of native coronary artery without angina pectoris: Secondary | ICD-10-CM | POA: Diagnosis not present

## 2023-11-12 DIAGNOSIS — E1122 Type 2 diabetes mellitus with diabetic chronic kidney disease: Secondary | ICD-10-CM | POA: Diagnosis not present

## 2023-11-12 DIAGNOSIS — D500 Iron deficiency anemia secondary to blood loss (chronic): Secondary | ICD-10-CM | POA: Diagnosis not present

## 2023-11-12 DIAGNOSIS — N1831 Chronic kidney disease, stage 3a: Secondary | ICD-10-CM | POA: Diagnosis not present

## 2023-11-12 DIAGNOSIS — M109 Gout, unspecified: Secondary | ICD-10-CM | POA: Diagnosis not present

## 2023-11-12 DIAGNOSIS — I5023 Acute on chronic systolic (congestive) heart failure: Secondary | ICD-10-CM | POA: Diagnosis not present

## 2023-11-12 DIAGNOSIS — I252 Old myocardial infarction: Secondary | ICD-10-CM | POA: Diagnosis not present

## 2023-11-12 DIAGNOSIS — E785 Hyperlipidemia, unspecified: Secondary | ICD-10-CM | POA: Diagnosis not present

## 2023-11-12 DIAGNOSIS — I480 Paroxysmal atrial fibrillation: Secondary | ICD-10-CM | POA: Diagnosis not present

## 2023-11-12 DIAGNOSIS — I959 Hypotension, unspecified: Secondary | ICD-10-CM | POA: Diagnosis not present

## 2023-11-13 NOTE — Patient Instructions (Signed)
 Visit Information  Thank you for taking time to visit with me today. Please don't hesitate to contact me if I can be of assistance to you.   Following are the goals we discussed today:   Goals Addressed             This Visit's Progress    home mangagement of congestive Heart Failure (CHF) & Atrial fibrillation (Afib)- care management services   On track    Interventions Today    Flowsheet Row Most Recent Value  Chronic Disease   Chronic disease during today's visit Atrial Fibrillation (AFib), Congestive Heart Failure (CHF)  General Interventions   General Interventions Discussed/Reviewed General Interventions Reviewed, Durable Medical Equipment (DME), Doctor Visits  Doctor Visits Discussed/Reviewed Doctor Visits Reviewed, PCP  Durable Medical Equipment (DME) Other  [has a quad cane at home Reports now without his walker He would feel safer with the quad cane vs a standard cane]  PCP/Specialist Visits Compliance with follow-up visit  Exercise Interventions   Exercise Discussed/Reviewed Exercise Reviewed, Physical Activity  [confirmed he is able to walk safely without his walker, Has been out to eat. Improvement]  Physical Activity Discussed/Reviewed Physical Activity Reviewed  Education Interventions   Education Provided Provided Education  [refilling medicines, suggest a quad cane vs a standard one]  Safety Interventions   Safety Discussed/Reviewed Safety Reviewed, Fall Risk, Home Safety  Home Safety Assistive Devices  Advanced Directive Interventions   Advanced Directives Discussed/Reviewed Advanced Directives Discussed  [no preference for advance directives]              Our next appointment is by telephone on 12/09/23 at 3:15 pm  Please call the care guide team at [phone] if you need to cancel or reschedule your appointment.   If you are experiencing a Mental Health or Behavioral Health Crisis or need someone to talk to, please call the Suicide and Crisis  Lifeline: 988 call the [place] National Suicide Prevention Lifeline: (365)568-7039 or TTY: 431-249-9950 TTY 8101157273) to talk to a trained counselor call 1-800-273-TALK (toll free, 24 hour hotline) call the [facility]: (781)524-4460 call 911   Patient verbalizes understanding of instructions and care plan provided today and agrees to view in MyChart. Active MyChart status and patient understanding of how to access instructions and care plan via MyChart confirmed with patient.     The patient has been provided with contact information for the care management team and has been advised to call with any health related questions or concerns.   Deanthony Maull L. Noelle Penner, RN, BSN, CCM [place]  [facility], [facility] Health RN Care Manager Direct Dial: [phone]  Fax: [phone] Mailing Address: [address]  [place] [address] Website: [email or web address]

## 2023-11-19 ENCOUNTER — Other Ambulatory Visit: Payer: Self-pay | Admitting: Family Medicine

## 2023-11-19 DIAGNOSIS — Z7901 Long term (current) use of anticoagulants: Secondary | ICD-10-CM | POA: Diagnosis not present

## 2023-11-19 DIAGNOSIS — E1122 Type 2 diabetes mellitus with diabetic chronic kidney disease: Secondary | ICD-10-CM | POA: Diagnosis not present

## 2023-11-19 DIAGNOSIS — J9610 Chronic respiratory failure, unspecified whether with hypoxia or hypercapnia: Secondary | ICD-10-CM | POA: Diagnosis not present

## 2023-11-19 DIAGNOSIS — I2510 Atherosclerotic heart disease of native coronary artery without angina pectoris: Secondary | ICD-10-CM | POA: Diagnosis not present

## 2023-11-19 DIAGNOSIS — I5023 Acute on chronic systolic (congestive) heart failure: Secondary | ICD-10-CM | POA: Diagnosis not present

## 2023-11-19 DIAGNOSIS — M4800 Spinal stenosis, site unspecified: Secondary | ICD-10-CM | POA: Diagnosis not present

## 2023-11-19 DIAGNOSIS — Z7984 Long term (current) use of oral hypoglycemic drugs: Secondary | ICD-10-CM | POA: Diagnosis not present

## 2023-11-19 DIAGNOSIS — D500 Iron deficiency anemia secondary to blood loss (chronic): Secondary | ICD-10-CM | POA: Diagnosis not present

## 2023-11-19 DIAGNOSIS — E785 Hyperlipidemia, unspecified: Secondary | ICD-10-CM | POA: Diagnosis not present

## 2023-11-19 DIAGNOSIS — N1831 Chronic kidney disease, stage 3a: Secondary | ICD-10-CM | POA: Diagnosis not present

## 2023-11-19 DIAGNOSIS — Z955 Presence of coronary angioplasty implant and graft: Secondary | ICD-10-CM | POA: Diagnosis not present

## 2023-11-19 DIAGNOSIS — I130 Hypertensive heart and chronic kidney disease with heart failure and stage 1 through stage 4 chronic kidney disease, or unspecified chronic kidney disease: Secondary | ICD-10-CM | POA: Diagnosis not present

## 2023-11-19 DIAGNOSIS — I480 Paroxysmal atrial fibrillation: Secondary | ICD-10-CM | POA: Diagnosis not present

## 2023-11-19 DIAGNOSIS — D631 Anemia in chronic kidney disease: Secondary | ICD-10-CM | POA: Diagnosis not present

## 2023-11-19 DIAGNOSIS — I252 Old myocardial infarction: Secondary | ICD-10-CM | POA: Diagnosis not present

## 2023-11-19 DIAGNOSIS — M109 Gout, unspecified: Secondary | ICD-10-CM | POA: Diagnosis not present

## 2023-11-19 DIAGNOSIS — I959 Hypotension, unspecified: Secondary | ICD-10-CM | POA: Diagnosis not present

## 2023-11-19 DIAGNOSIS — M0579 Rheumatoid arthritis with rheumatoid factor of multiple sites without organ or systems involvement: Secondary | ICD-10-CM | POA: Diagnosis not present

## 2023-11-21 ENCOUNTER — Other Ambulatory Visit: Payer: Self-pay | Admitting: Family Medicine

## 2023-11-25 ENCOUNTER — Ambulatory Visit: Payer: Medicare Other | Admitting: Family Medicine

## 2023-11-26 ENCOUNTER — Other Ambulatory Visit: Payer: Self-pay

## 2023-11-26 ENCOUNTER — Inpatient Hospital Stay (HOSPITAL_COMMUNITY)
Admission: EM | Admit: 2023-11-26 | Discharge: 2023-12-02 | DRG: 377 | Disposition: A | Discharge status: Home Health | Attending: Internal Medicine | Admitting: Internal Medicine

## 2023-11-26 ENCOUNTER — Emergency Department (HOSPITAL_COMMUNITY)

## 2023-11-26 ENCOUNTER — Encounter (HOSPITAL_COMMUNITY): Payer: Self-pay

## 2023-11-26 ENCOUNTER — Ambulatory Visit: Admitting: Family Medicine

## 2023-11-26 DIAGNOSIS — Z955 Presence of coronary angioplasty implant and graft: Secondary | ICD-10-CM

## 2023-11-26 DIAGNOSIS — Z8052 Family history of malignant neoplasm of bladder: Secondary | ICD-10-CM

## 2023-11-26 DIAGNOSIS — T18128A Food in esophagus causing other injury, initial encounter: Secondary | ICD-10-CM | POA: Diagnosis not present

## 2023-11-26 DIAGNOSIS — R059 Cough, unspecified: Secondary | ICD-10-CM | POA: Diagnosis not present

## 2023-11-26 DIAGNOSIS — Z8673 Personal history of transient ischemic attack (TIA), and cerebral infarction without residual deficits: Secondary | ICD-10-CM

## 2023-11-26 DIAGNOSIS — T45526A Underdosing of antithrombotic drugs, initial encounter: Secondary | ICD-10-CM | POA: Diagnosis present

## 2023-11-26 DIAGNOSIS — Z87891 Personal history of nicotine dependence: Secondary | ICD-10-CM

## 2023-11-26 DIAGNOSIS — Z7902 Long term (current) use of antithrombotics/antiplatelets: Secondary | ICD-10-CM

## 2023-11-26 DIAGNOSIS — I482 Chronic atrial fibrillation, unspecified: Secondary | ICD-10-CM | POA: Diagnosis not present

## 2023-11-26 DIAGNOSIS — Z7901 Long term (current) use of anticoagulants: Secondary | ICD-10-CM

## 2023-11-26 DIAGNOSIS — T45515A Adverse effect of anticoagulants, initial encounter: Secondary | ICD-10-CM | POA: Diagnosis present

## 2023-11-26 DIAGNOSIS — W44F3XA Food entering into or through a natural orifice, initial encounter: Secondary | ICD-10-CM | POA: Diagnosis present

## 2023-11-26 DIAGNOSIS — E876 Hypokalemia: Secondary | ICD-10-CM | POA: Diagnosis not present

## 2023-11-26 DIAGNOSIS — Z860101 Personal history of adenomatous and serrated colon polyps: Secondary | ICD-10-CM

## 2023-11-26 DIAGNOSIS — Z5329 Procedure and treatment not carried out because of patient's decision for other reasons: Secondary | ICD-10-CM | POA: Diagnosis present

## 2023-11-26 DIAGNOSIS — I350 Nonrheumatic aortic (valve) stenosis: Secondary | ICD-10-CM | POA: Diagnosis present

## 2023-11-26 DIAGNOSIS — D62 Acute posthemorrhagic anemia: Secondary | ICD-10-CM | POA: Diagnosis present

## 2023-11-26 DIAGNOSIS — E861 Hypovolemia: Secondary | ICD-10-CM | POA: Diagnosis present

## 2023-11-26 DIAGNOSIS — Z9181 History of falling: Secondary | ICD-10-CM

## 2023-11-26 DIAGNOSIS — Z79631 Long term (current) use of antimetabolite agent: Secondary | ICD-10-CM

## 2023-11-26 DIAGNOSIS — K921 Melena: Principal | ICD-10-CM

## 2023-11-26 DIAGNOSIS — I480 Paroxysmal atrial fibrillation: Secondary | ICD-10-CM | POA: Diagnosis not present

## 2023-11-26 DIAGNOSIS — E871 Hypo-osmolality and hyponatremia: Secondary | ICD-10-CM | POA: Diagnosis present

## 2023-11-26 DIAGNOSIS — Z91148 Patient's other noncompliance with medication regimen for other reason: Secondary | ICD-10-CM

## 2023-11-26 DIAGNOSIS — S40022A Contusion of left upper arm, initial encounter: Secondary | ICD-10-CM | POA: Diagnosis present

## 2023-11-26 DIAGNOSIS — Y92099 Unspecified place in other non-institutional residence as the place of occurrence of the external cause: Secondary | ICD-10-CM

## 2023-11-26 DIAGNOSIS — R918 Other nonspecific abnormal finding of lung field: Secondary | ICD-10-CM | POA: Diagnosis not present

## 2023-11-26 DIAGNOSIS — S41111A Laceration without foreign body of right upper arm, initial encounter: Secondary | ICD-10-CM | POA: Diagnosis present

## 2023-11-26 DIAGNOSIS — E119 Type 2 diabetes mellitus without complications: Secondary | ICD-10-CM

## 2023-11-26 DIAGNOSIS — I5022 Chronic systolic (congestive) heart failure: Secondary | ICD-10-CM | POA: Diagnosis not present

## 2023-11-26 DIAGNOSIS — J101 Influenza due to other identified influenza virus with other respiratory manifestations: Secondary | ICD-10-CM | POA: Diagnosis not present

## 2023-11-26 DIAGNOSIS — I491 Atrial premature depolarization: Secondary | ICD-10-CM | POA: Diagnosis present

## 2023-11-26 DIAGNOSIS — I10 Essential (primary) hypertension: Secondary | ICD-10-CM | POA: Diagnosis not present

## 2023-11-26 DIAGNOSIS — E878 Other disorders of electrolyte and fluid balance, not elsewhere classified: Secondary | ICD-10-CM | POA: Diagnosis not present

## 2023-11-26 DIAGNOSIS — M0579 Rheumatoid arthritis with rheumatoid factor of multiple sites without organ or systems involvement: Secondary | ICD-10-CM | POA: Diagnosis present

## 2023-11-26 DIAGNOSIS — I771 Stricture of artery: Secondary | ICD-10-CM | POA: Diagnosis not present

## 2023-11-26 DIAGNOSIS — R109 Unspecified abdominal pain: Secondary | ICD-10-CM | POA: Diagnosis not present

## 2023-11-26 DIAGNOSIS — Z1152 Encounter for screening for COVID-19: Secondary | ICD-10-CM

## 2023-11-26 DIAGNOSIS — Z833 Family history of diabetes mellitus: Secondary | ICD-10-CM

## 2023-11-26 DIAGNOSIS — S61411A Laceration without foreign body of right hand, initial encounter: Secondary | ICD-10-CM | POA: Diagnosis present

## 2023-11-26 DIAGNOSIS — R0600 Dyspnea, unspecified: Secondary | ICD-10-CM | POA: Diagnosis not present

## 2023-11-26 DIAGNOSIS — I5020 Unspecified systolic (congestive) heart failure: Secondary | ICD-10-CM | POA: Diagnosis present

## 2023-11-26 DIAGNOSIS — D6832 Hemorrhagic disorder due to extrinsic circulating anticoagulants: Secondary | ICD-10-CM | POA: Diagnosis not present

## 2023-11-26 DIAGNOSIS — R0989 Other specified symptoms and signs involving the circulatory and respiratory systems: Secondary | ICD-10-CM | POA: Diagnosis not present

## 2023-11-26 DIAGNOSIS — Z743 Need for continuous supervision: Secondary | ICD-10-CM | POA: Diagnosis not present

## 2023-11-26 DIAGNOSIS — J1001 Influenza due to other identified influenza virus with the same other identified influenza virus pneumonia: Secondary | ICD-10-CM | POA: Diagnosis present

## 2023-11-26 DIAGNOSIS — I2510 Atherosclerotic heart disease of native coronary artery without angina pectoris: Secondary | ICD-10-CM | POA: Diagnosis present

## 2023-11-26 DIAGNOSIS — I2489 Other forms of acute ischemic heart disease: Secondary | ICD-10-CM | POA: Diagnosis present

## 2023-11-26 DIAGNOSIS — I440 Atrioventricular block, first degree: Secondary | ICD-10-CM | POA: Diagnosis not present

## 2023-11-26 DIAGNOSIS — R7989 Other specified abnormal findings of blood chemistry: Secondary | ICD-10-CM | POA: Diagnosis not present

## 2023-11-26 DIAGNOSIS — Z9842 Cataract extraction status, left eye: Secondary | ICD-10-CM

## 2023-11-26 DIAGNOSIS — J9601 Acute respiratory failure with hypoxia: Secondary | ICD-10-CM | POA: Diagnosis present

## 2023-11-26 DIAGNOSIS — R1010 Upper abdominal pain, unspecified: Secondary | ICD-10-CM | POA: Diagnosis not present

## 2023-11-26 DIAGNOSIS — K449 Diaphragmatic hernia without obstruction or gangrene: Secondary | ICD-10-CM | POA: Diagnosis present

## 2023-11-26 DIAGNOSIS — I452 Bifascicular block: Secondary | ICD-10-CM | POA: Diagnosis present

## 2023-11-26 DIAGNOSIS — K922 Gastrointestinal hemorrhage, unspecified: Principal | ICD-10-CM | POA: Diagnosis present

## 2023-11-26 DIAGNOSIS — Z79899 Other long term (current) drug therapy: Secondary | ICD-10-CM

## 2023-11-26 DIAGNOSIS — W19XXXA Unspecified fall, initial encounter: Secondary | ICD-10-CM | POA: Diagnosis present

## 2023-11-26 DIAGNOSIS — R531 Weakness: Principal | ICD-10-CM

## 2023-11-26 DIAGNOSIS — D649 Anemia, unspecified: Secondary | ICD-10-CM | POA: Diagnosis present

## 2023-11-26 DIAGNOSIS — E785 Hyperlipidemia, unspecified: Secondary | ICD-10-CM | POA: Diagnosis not present

## 2023-11-26 DIAGNOSIS — R0602 Shortness of breath: Secondary | ICD-10-CM | POA: Diagnosis not present

## 2023-11-26 DIAGNOSIS — Z789 Other specified health status: Secondary | ICD-10-CM

## 2023-11-26 DIAGNOSIS — Z9841 Cataract extraction status, right eye: Secondary | ICD-10-CM

## 2023-11-26 DIAGNOSIS — Z9089 Acquired absence of other organs: Secondary | ICD-10-CM

## 2023-11-26 DIAGNOSIS — J929 Pleural plaque without asbestos: Secondary | ICD-10-CM | POA: Diagnosis not present

## 2023-11-26 DIAGNOSIS — Z8249 Family history of ischemic heart disease and other diseases of the circulatory system: Secondary | ICD-10-CM

## 2023-11-26 DIAGNOSIS — Z8719 Personal history of other diseases of the digestive system: Secondary | ICD-10-CM

## 2023-11-26 DIAGNOSIS — S40021A Contusion of right upper arm, initial encounter: Secondary | ICD-10-CM | POA: Diagnosis present

## 2023-11-26 DIAGNOSIS — Z823 Family history of stroke: Secondary | ICD-10-CM

## 2023-11-26 DIAGNOSIS — Z95828 Presence of other vascular implants and grafts: Secondary | ICD-10-CM

## 2023-11-26 DIAGNOSIS — I252 Old myocardial infarction: Secondary | ICD-10-CM

## 2023-11-26 DIAGNOSIS — R0902 Hypoxemia: Secondary | ICD-10-CM | POA: Diagnosis not present

## 2023-11-26 DIAGNOSIS — I110 Hypertensive heart disease with heart failure: Secondary | ICD-10-CM | POA: Diagnosis not present

## 2023-11-26 DIAGNOSIS — Z83438 Family history of other disorder of lipoprotein metabolism and other lipidemia: Secondary | ICD-10-CM

## 2023-11-26 DIAGNOSIS — S41112A Laceration without foreign body of left upper arm, initial encounter: Secondary | ICD-10-CM | POA: Diagnosis present

## 2023-11-26 DIAGNOSIS — Z9889 Other specified postprocedural states: Secondary | ICD-10-CM

## 2023-11-26 DIAGNOSIS — I517 Cardiomegaly: Secondary | ICD-10-CM | POA: Diagnosis not present

## 2023-11-26 DIAGNOSIS — Z7984 Long term (current) use of oral hypoglycemic drugs: Secondary | ICD-10-CM

## 2023-11-26 DIAGNOSIS — M109 Gout, unspecified: Secondary | ICD-10-CM | POA: Diagnosis present

## 2023-11-26 HISTORY — DX: Unspecified atrial fibrillation: I48.91

## 2023-11-26 HISTORY — DX: Old myocardial infarction: I25.2

## 2023-11-26 LAB — COMPREHENSIVE METABOLIC PANEL
ALT: 54 U/L — ABNORMAL HIGH (ref 0–44)
AST: 63 U/L — ABNORMAL HIGH (ref 15–41)
Albumin: 3.3 g/dL — ABNORMAL LOW (ref 3.5–5.0)
Alkaline Phosphatase: 67 U/L (ref 38–126)
Anion gap: 14 (ref 5–15)
BUN: 16 mg/dL (ref 8–23)
CO2: 31 mmol/L (ref 22–32)
Calcium: 8.7 mg/dL — ABNORMAL LOW (ref 8.9–10.3)
Chloride: 81 mmol/L — ABNORMAL LOW (ref 98–111)
Creatinine, Ser: 1.19 mg/dL (ref 0.61–1.24)
GFR, Estimated: >60 mL/min (ref >60)
Glucose, Bld: 145 mg/dL — ABNORMAL HIGH (ref 70–99)
Potassium: 2.7 mmol/L — CL (ref 3.5–5.1)
Sodium: 126 mmol/L — ABNORMAL LOW (ref 135–145)
Total Bilirubin: 1.2 mg/dL (ref 0.0–1.2)
Total Protein: 6.8 g/dL (ref 6.5–8.1)

## 2023-11-26 LAB — CBC
HCT: 28.5 % — ABNORMAL LOW (ref 39.0–52.0)
Hemoglobin: 9.4 g/dL — ABNORMAL LOW (ref 13.0–17.0)
MCH: 28.8 pg (ref 26.0–34.0)
MCHC: 33 g/dL (ref 30.0–36.0)
MCV: 87.4 fL (ref 80.0–100.0)
Platelets: 180 10*3/uL (ref 150–400)
RBC: 3.26 MIL/uL — ABNORMAL LOW (ref 4.22–5.81)
RDW: 15.5 % (ref 11.5–15.5)
WBC: 3.8 10*3/uL — ABNORMAL LOW (ref 4.0–10.5)
nRBC: 0 % (ref 0.0–0.2)

## 2023-11-26 LAB — CBC WITH DIFFERENTIAL/PLATELET
Abs Immature Granulocytes: 0.03 10*3/uL (ref 0.00–0.07)
Basophils Absolute: 0 10*3/uL (ref 0.0–0.1)
Basophils Relative: 0 %
Eosinophils Absolute: 0.1 10*3/uL (ref 0.0–0.5)
Eosinophils Relative: 3 %
HCT: 28.7 % — ABNORMAL LOW (ref 39.0–52.0)
Hemoglobin: 9.7 g/dL — ABNORMAL LOW (ref 13.0–17.0)
Immature Granulocytes: 1 %
Lymphocytes Relative: 18 %
Lymphs Abs: 0.8 10*3/uL (ref 0.7–4.0)
MCH: 29.5 pg (ref 26.0–34.0)
MCHC: 33.8 g/dL (ref 30.0–36.0)
MCV: 87.2 fL (ref 80.0–100.0)
Monocytes Absolute: 0.5 10*3/uL (ref 0.1–1.0)
Monocytes Relative: 12 %
Neutro Abs: 2.8 10*3/uL (ref 1.7–7.7)
Neutrophils Relative %: 66 %
Platelets: 197 10*3/uL (ref 150–400)
RBC: 3.29 MIL/uL — ABNORMAL LOW (ref 4.22–5.81)
RDW: 15.6 % — ABNORMAL HIGH (ref 11.5–15.5)
WBC: 4.2 10*3/uL (ref 4.0–10.5)
nRBC: 0 % (ref 0.0–0.2)

## 2023-11-26 LAB — GLUCOSE, CAPILLARY: Glucose-Capillary: 134 mg/dL — ABNORMAL HIGH (ref 70–99)

## 2023-11-26 LAB — TYPE AND SCREEN
ABO/RH(D): O POS
Antibody Screen: NEGATIVE

## 2023-11-26 LAB — SODIUM, URINE, RANDOM: Sodium, Ur: 10 mmol/L

## 2023-11-26 LAB — POC OCCULT BLOOD, ED: Fecal Occult Bld: POSITIVE — AB

## 2023-11-26 LAB — URINALYSIS, ROUTINE W REFLEX MICROSCOPIC
Bacteria, UA: NONE SEEN
Bilirubin Urine: NEGATIVE
Glucose, UA: >=500 mg/dL — AB
Ketones, ur: NEGATIVE mg/dL
Nitrite: NEGATIVE
Protein, ur: 100 mg/dL — AB
Specific Gravity, Urine: 1.012 (ref 1.005–1.030)
WBC, UA: >50 WBC/hpf (ref 0–5)
pH: 7 (ref 5.0–8.0)

## 2023-11-26 LAB — TROPONIN I (HIGH SENSITIVITY)
Troponin I (High Sensitivity): 161 ng/L (ref <18)
Troponin I (High Sensitivity): 148 ng/L (ref <18)

## 2023-11-26 LAB — BRAIN NATRIURETIC PEPTIDE: B Natriuretic Peptide: 858 pg/mL — ABNORMAL HIGH (ref 0.0–100.0)

## 2023-11-26 LAB — RESP PANEL BY RT-PCR (RSV, FLU A&B, COVID)  RVPGX2
Influenza A by PCR: POSITIVE — AB
Influenza B by PCR: NEGATIVE
Resp Syncytial Virus by PCR: NEGATIVE
SARS Coronavirus 2 by RT PCR: NEGATIVE

## 2023-11-26 LAB — MAGNESIUM: Magnesium: 1.7 mg/dL (ref 1.7–2.4)

## 2023-11-26 LAB — CK: Total CK: 118 U/L (ref 49–397)

## 2023-11-26 MED ORDER — SODIUM CHLORIDE 0.9 % IV SOLN
2.0000 g | INTRAVENOUS | Status: DC
  Administered 2023-11-27 – 2023-11-28 (×2): 2 g via INTRAVENOUS
  Filled 2023-11-26 (×2): qty 20

## 2023-11-26 MED ORDER — INSULIN ASPART 100 UNIT/ML IJ SOLN
0.0000 [IU] | INTRAMUSCULAR | Status: DC
  Administered 2023-11-27: 1 [IU] via SUBCUTANEOUS
  Administered 2023-11-28: 2 [IU] via SUBCUTANEOUS
  Administered 2023-11-28 – 2023-11-29 (×3): 1 [IU] via SUBCUTANEOUS
  Administered 2023-11-29: 2 [IU] via SUBCUTANEOUS
  Administered 2023-11-29: 3 [IU] via SUBCUTANEOUS
  Administered 2023-11-29 (×2): 1 [IU] via SUBCUTANEOUS
  Administered 2023-11-29: 2 [IU] via SUBCUTANEOUS
  Administered 2023-11-29: 3 [IU] via SUBCUTANEOUS
  Administered 2023-11-30: 1 [IU] via SUBCUTANEOUS

## 2023-11-26 MED ORDER — OSELTAMIVIR PHOSPHATE 75 MG PO CAPS
75.0000 mg | ORAL_CAPSULE | Freq: Once | ORAL | Status: AC
  Administered 2023-11-26: 75 mg via ORAL
  Filled 2023-11-26: qty 1

## 2023-11-26 MED ORDER — SODIUM CHLORIDE 0.9% FLUSH
3.0000 mL | Freq: Two times a day (BID) | INTRAVENOUS | Status: DC
  Administered 2023-11-27 – 2023-12-02 (×11): 3 mL via INTRAVENOUS

## 2023-11-26 MED ORDER — ACETAMINOPHEN 325 MG PO TABS: 650.0000 mg | ORAL_TABLET | Freq: Four times a day (QID) | ORAL | Status: DC | PRN

## 2023-11-26 MED ORDER — POTASSIUM CHLORIDE 10 MEQ/100ML IV SOLN
10.0000 meq | INTRAVENOUS | Status: AC
  Administered 2023-11-26 (×6): 10 meq via INTRAVENOUS
  Filled 2023-11-26 (×6): qty 100

## 2023-11-26 MED ORDER — SODIUM CHLORIDE 0.9 % IV SOLN
1.0000 g | Freq: Once | INTRAVENOUS | Status: AC
  Administered 2023-11-26: 1 g via INTRAVENOUS
  Filled 2023-11-26: qty 10

## 2023-11-26 MED ORDER — SODIUM CHLORIDE 0.9 % IV SOLN
500.0000 mg | Freq: Once | INTRAVENOUS | Status: DC
  Filled 2023-11-26: qty 5

## 2023-11-26 MED ORDER — MAGNESIUM SULFATE IN D5W 1-5 GM/100ML-% IV SOLN
1.0000 g | Freq: Once | INTRAVENOUS | Status: AC
  Administered 2023-11-27: 1 g via INTRAVENOUS
  Filled 2023-11-26: qty 100

## 2023-11-26 MED ORDER — AMIODARONE HCL 200 MG PO TABS
200.0000 mg | ORAL_TABLET | Freq: Every day | ORAL | Status: DC
  Administered 2023-11-27: 200 mg via ORAL
  Filled 2023-11-26: qty 1

## 2023-11-26 MED ORDER — OXYCODONE HCL 5 MG PO TABS
5.0000 mg | ORAL_TABLET | ORAL | Status: DC | PRN
  Administered 2023-11-27 (×2): 5 mg via ORAL
  Filled 2023-11-26 (×2): qty 1

## 2023-11-26 MED ORDER — CARVEDILOL 3.125 MG PO TABS
6.2500 mg | ORAL_TABLET | Freq: Two times a day (BID) | ORAL | Status: DC
  Administered 2023-11-26 – 2023-11-28 (×4): 6.25 mg via ORAL
  Filled 2023-11-26 (×4): qty 2

## 2023-11-26 MED ORDER — POTASSIUM CHLORIDE 20 MEQ PO PACK
40.0000 meq | PACK | Freq: Once | ORAL | Status: AC
  Administered 2023-11-26: 40 meq via ORAL
  Filled 2023-11-26: qty 2

## 2023-11-26 MED ORDER — ATORVASTATIN CALCIUM 40 MG PO TABS
80.0000 mg | ORAL_TABLET | Freq: Every day | ORAL | Status: DC
  Administered 2023-11-27: 80 mg via ORAL
  Filled 2023-11-26: qty 2

## 2023-11-26 MED ORDER — PANTOPRAZOLE SODIUM 40 MG IV SOLR
40.0000 mg | Freq: Two times a day (BID) | INTRAVENOUS | Status: DC
  Administered 2023-11-26 – 2023-11-30 (×8): 40 mg via INTRAVENOUS
  Filled 2023-11-26 (×8): qty 10

## 2023-11-26 MED ORDER — SODIUM CHLORIDE 0.9 % IV SOLN
100.0000 mg | Freq: Two times a day (BID) | INTRAVENOUS | Status: DC
  Administered 2023-11-27 – 2023-11-29 (×6): 100 mg via INTRAVENOUS
  Filled 2023-11-26 (×10): qty 100

## 2023-11-26 MED ORDER — OSELTAMIVIR PHOSPHATE 30 MG PO CAPS
30.0000 mg | ORAL_CAPSULE | Freq: Two times a day (BID) | ORAL | Status: DC
  Administered 2023-11-27: 30 mg via ORAL
  Filled 2023-11-26: qty 1

## 2023-11-26 MED ORDER — SODIUM CHLORIDE 0.9 % IV BOLUS
1000.0000 mL | Freq: Once | INTRAVENOUS | Status: AC
  Administered 2023-11-26: 1000 mL via INTRAVENOUS

## 2023-11-26 MED ORDER — ACETAMINOPHEN 650 MG RE SUPP: 650.0000 mg | Freq: Four times a day (QID) | RECTAL | Status: DC | PRN

## 2023-11-26 MED ORDER — GUAIFENESIN 100 MG/5ML PO LIQD: 5.0000 mL | ORAL | Status: DC | PRN

## 2023-11-26 NOTE — ED Provider Notes (Signed)
 [place] EMERGENCY DEPARTMENT AT [facility] Provider Note   CSN: [id number] Arrival date & time: 11/26/23  1248     History {Add pertinent medical, surgical, social history, OB history to HPI:1} Chief Complaint  Patient presents with   Weakness    Connor Bryant is a 78 y.o. male.   Weakness Associated symptoms: abdominal pain, cough and diarrhea   Patient presenting for generalized weakness.  Medical history includes chronic mesenteric ischemia, HTN, DM, SBO, atrial fibrillation, CAD, GI bleed, anemia, CHF, aortic stenosis, CVA.  He lives alone.  Typically, he is able to ambulate without any devices.  For the past 5 days, he has had cough, congestion, generalized weakness.  His weakness has progressed to the point that he has required use of a walker for the past 3 days.  He had a fall 2 nights ago.  He does not suspect any injuries from that.  He had diarrhea today.  His stools have been dark lately.  He denies any current areas of discomfort.  He did have some prior discomfort in his upper abdomen which she attributed to frequent coughing.     Home Medications Prior to Admission medications   Medication Sig Start Date End Date Taking? Authorizing Provider  Accu-Chek Softclix Lancets lancets SMARTSIG:Topical 1-3 Times Daily 06/25/23   [provider]  acetaminophen (TYLENOL) 325 MG tablet Take 2 tablets (650 mg total) by mouth every 4 (four) hours as needed for headache or mild pain (pain score 1-3). 09/27/23   Lee, [place], NP  allopurinol (ZYLOPRIM) 100 MG tablet TAKE 1 TABLET 2 TO 3 TIMES A DAY FOR GOUT 11/19/23   Mechele Claude, MD  amiodarone (PACERONE) 200 MG tablet Take 1 tablet (200 mg total) by mouth daily. 09/28/23   Lee, [place], NP  apixaban (ELIQUIS) 5 MG TABS tablet Take 1 tablet (5 mg total) by mouth 2 (two) times daily. 10/15/23   Mechele Claude, MD  atorvastatin (LIPITOR) 80 MG tablet Take 1 tablet (80 mg total) by mouth daily. 10/15/23   Mechele Claude,  MD  Blood Glucose Monitoring Suppl (ACCU-CHEK GUIDE) w/Device KIT CHECK BLOOD SUGAR UP TO THREE TIMES DAILY AS DIRECTED 06/25/23   [provider]  Blood Glucose Monitoring Suppl DEVI 1 each by Does not apply route in the morning, at noon, and at bedtime. May substitute to any manufacturer covered by patient's insurance. 06/25/23   Mechele Claude, MD  BOOSTRIX 5-2.5-18.5 LF-MCG/0.5 injection  08/01/23   [provider]  Calcium Carb-Cholecalciferol (CALCIUM 500 + D PO) Take 1 tablet by mouth daily.    [provider]  carvedilol (COREG) 6.25 MG tablet TAKE ONE TABLET TWICE DAILY 11/19/23   Mechele Claude, MD  clopidogrel (PLAVIX) 75 MG tablet Take 1 tablet (75 mg total) by mouth daily with breakfast. 09/28/23   Lee, [place], NP  dapagliflozin propanediol (FARXIGA) 5 MG TABS tablet Take 2 tablets (10 mg total) by mouth daily. 09/27/23   Lee, [place], NP  ferrous sulfate 325 (65 FE) MG tablet Take 1 tablet (325 mg total) by mouth daily. 09/28/23   Lee, [place], NP  furosemide (LASIX) 20 MG tablet TAKE ONE TABLET AS DIRECTED AS NEEDED FOR WEIGHT GAIN AND SWELLING 11/19/23   Mechele Claude, MD  pantoprazole (PROTONIX) 40 MG tablet TAKE ONE TABLET TWICE DAILY 11/21/23   Mechele Claude, MD  spironolactone (ALDACTONE) 25 MG tablet Take 0.5 tablets (12.5 mg total) by mouth daily. 09/03/23   Laurey Morale, MD  Allergies    Patient has no known allergies.    Review of Systems   Review of Systems  Constitutional:  Positive for activity change, appetite change and fatigue.  HENT:  Positive for congestion.   Respiratory:  Positive for cough.   Gastrointestinal:  Positive for abdominal pain and diarrhea.  Neurological:  Positive for weakness (Generalized).  All other systems reviewed and are negative.   Physical Exam Updated Vital Signs BP (!) 139/48   Pulse (!) 48   Temp 98.7 F (37.1 C) (Oral)   Resp 17   Ht 5\' 8"  (1.727 m)   Wt 83.5 kg   SpO2 93%   BMI 27.98 kg/m   Physical Exam Vitals and nursing note reviewed.  Constitutional:      General: He is not in acute distress.    Appearance: Normal appearance. He is well-developed. He is not ill-appearing, toxic-appearing or diaphoretic.  HENT:     Head: Normocephalic and atraumatic.     Right Ear: External ear normal.     Left Ear: External ear normal.     Nose: Nose normal.     Mouth/Throat:     Mouth: Mucous membranes are moist.  Eyes:     Extraocular Movements: Extraocular movements intact.     Conjunctiva/sclera: Conjunctivae normal.  Cardiovascular:     Rate and Rhythm: Normal rate and regular rhythm.  Pulmonary:     Effort: Pulmonary effort is normal. No respiratory distress.     Breath sounds: Rhonchi present. No wheezing or rales.  Abdominal:     General: There is no distension.     Palpations: Abdomen is soft.     Tenderness: There is no abdominal tenderness.  Musculoskeletal:        General: No swelling or deformity. Normal range of motion.     Cervical back: Normal range of motion and neck supple.  Skin:    General: Skin is warm and dry.     Coloration: Skin is not jaundiced or pale.     Comments: Mild bruising and skin tears to upper extremities.  Neurological:     General: No focal deficit present.     Mental Status: He is alert and oriented to person, place, and time.  Psychiatric:        Mood and Affect: Mood normal.        Behavior: Behavior normal.     ED Results / Procedures / Treatments   Labs (all labs ordered are listed, but only abnormal results are displayed) Labs Reviewed  RESP PANEL BY RT-PCR (RSV, FLU A&B, COVID)  RVPGX2 - Abnormal; Notable for the following components:      Result Value   Influenza A by PCR POSITIVE (*)    All other components within normal limits  CBC WITH DIFFERENTIAL/PLATELET - Abnormal; Notable for the following components:   RBC 3.29 (*)    Hemoglobin 9.7 (*)    HCT 28.7 (*)    RDW 15.6 (*)    All other components within normal  limits  COMPREHENSIVE METABOLIC PANEL - Abnormal; Notable for the following components:   Sodium 126 (*)    Potassium 2.7 (*)    Chloride 81 (*)    Glucose, Bld 145 (*)    Calcium 8.7 (*)    Albumin 3.3 (*)    AST 63 (*)    ALT 54 (*)    All other components within normal limits  URINALYSIS, ROUTINE W REFLEX MICROSCOPIC - Abnormal; Notable for the  following components:   APPearance HAZY (*)    Glucose, UA >=500 (*)    Hgb urine dipstick SMALL (*)    Protein, ur 100 (*)    Leukocytes,Ua LARGE (*)    Non Squamous Epithelial 0-5 (*)    All other components within normal limits  TROPONIN I (HIGH SENSITIVITY) - Abnormal; Notable for the following components:   Troponin I (High Sensitivity) 161 (*)    All other components within normal limits  TROPONIN I (HIGH SENSITIVITY)    EKG EKG Interpretation Date/Time:  Tuesday November 26 2023 13:03:47 EST Ventricular Rate:  56 PR Interval:  216 QRS Duration:  112 QT Interval:  554 QTC Calculation: 534 R Axis:   -70  Text Interpretation: Sinus bradycardia with 1st degree A-V block with Premature atrial complexes Incomplete right bundle branch block Left anterior fascicular block Anteroseptal infarct (cited on or before 26-Nov-2023) Prolonged QT Abnormal ECG When compared with ECG of 17-Sep-2023 06:54, Premature atrial complexes are now Present Incomplete right bundle branch block is now Present Questionable change in initial forces of Anterior leads Confirmed by Meridee Score [phone]) on 11/26/2023 1:05:14 PM  Radiology No results found.  Procedures Procedures  {Document cardiac monitor, telemetry assessment procedure when appropriate:1}  Medications Ordered in ED Medications - No data to display  ED Course/ Medical Decision Making/ A&P   {   Click here for ABCD2, HEART and other calculatorsREFRESH Note before signing :1}                              Medical Decision Making Amount and/or Complexity of Data Reviewed Labs:  ordered. Radiology: ordered.  Risk Prescription drug management.   This patient presents to the ED for concern of ***, this involves an extensive number of treatment options, and is a complaint that carries with it a high risk of complications and morbidity.  The differential diagnosis includes ***   Co morbidities that complicate the patient evaluation  ***   Additional history obtained:  Additional history obtained from *** External records from outside source obtained and reviewed including ***   Lab Tests:  I Ordered, and personally interpreted labs.  The pertinent results include:  ***   Imaging Studies ordered:  I ordered imaging studies including ***  I independently visualized and interpreted imaging which showed *** I agree with the radiologist interpretation   Cardiac Monitoring: / EKG:  The patient was maintained on a cardiac monitor.  I personally viewed and interpreted the cardiac monitored which showed an underlying rhythm of: ***   Consultations Obtained:  I requested consultation with the ***,  and discussed lab and imaging findings as well as pertinent plan - they recommend: ***   Problem List / ED Course / Critical interventions / Medication management  Patient presenting for worsening generalized weakness for the past 5 days.  Workup initiated prior to patient committed in the ED notable for 2 g/dL drop in hemoglobin when compared to lab work from 2 months ago, hypokalemia, hyponatremia, hypochloremia.  Initial troponin elevated at 161.  He is found to be positive for influenza A.  In terms of his hemoglobin drop, patient does report recent dark stools.  DRE showed dark stool that was Hemoccult positive.  Protonix was ordered. He is on Eliquis. He has had poor appetite and poor p.o. intake lately.  IV fluids were ordered for hydration.  Electrolyte replacement initiated.***. I ordered medication including ***  for ***  Reevaluation of the patient  after these medicines showed that the patient {resolved/improved/worsened:23923::"improved"} I have reviewed the patients home medicines and have made adjustments as needed   Social Determinants of Health:  ***   Test / Admission - Considered:  ***   {Document critical care time when appropriate:1} {Document review of labs and clinical decision tools ie heart score, Chads2Vasc2 etc:1}  {Document your independent review of radiology images, and any outside records:1} {Document your discussion with family members, caretakers, and with consultants:1} {Document social determinants of health affecting pt's care:1} {Document your decision making why or why not admission, treatments were needed:1} Final Clinical Impression(s) / ED Diagnoses Final diagnoses:  None    Rx / DC Orders ED Discharge Orders     None

## 2023-11-26 NOTE — ED Notes (Signed)
 Pt assisted in restroom. Pt reports accidental BM. Pt provided cleaning and hospital scrub bottoms to wear.

## 2023-11-26 NOTE — H&P (Addendum)
 History and Physical    Connor Bryant HYQ:[id number] DOB: Jan 10, 1946 DOA: 11/26/2023  PCP: Mechele Claude, MD   Patient coming from: Home   Chief Complaint: Generalized weakness, cough, SOB    HPI: Connor Bryant is a 78 y.o. male with medical history significant for type 2 diabetes mellitus, hypertension, hyperlipidemia, CAD, chronic HFrEF, and PAF on Eliquis who now presents with generalized weakness, cough, and SOB.  Patient developed rhinorrhea and cough a few days ago and has had progressive generalized weakness, fatigue, and dyspnea since then.  He has been using a walker at home for the past 3 days due to his generalized weakness.  He denies any chest pain, abdominal pain, or vomiting associated with this.  His chronic bilateral leg swelling is unchanged or better than usual.  He has not seen any blood in his stool.  His stool was darker than usual yesterday but not black.  He has not taken Plavix for more than a month now due to bleeding from his mouth and nose.  ED Course: Upon arrival to the ED, patient is found to be afebrile and saturating low 90s on room air with stable blood pressure.  EKG demonstrates sinus bradycardia with first-degree AV nodal block, PAC, incomplete RBBB, and LAFB.  Chest x-ray notable for prominent bilateral interstitial opacities and patchy retrocardiac opacity.  Labs are most notable for positive influenza A PCR, positive FOBT, sodium 126, potassium 2.7, hemoglobin 9.7, and troponin 161.  Gastroenterology (Dr. Levon Hedger) was consulted by the ED physician and the patient was treated with antibiotics, IV Protonix, IV and oral potassium, and 1 L NS.  Review of Systems:  All other systems reviewed and apart from HPI, are negative.  Past Medical History:  Diagnosis Date   Acute CVA (cerebrovascular accident) (HCC) 05/01/2020   Acute respiratory failure with hypoxia (HCC) 05/04/2020   Arthritis    Atrial fibrillation (HCC)    DM type 2 (diabetes mellitus,  type 2) (HCC)    no meds   Endotracheally intubated    Gallstone    Heart murmur    was told one time he had a murmur, not since 30 years ago   HTN (hypertension)    Hyperlipidemia    Myocardial infarct, old    2021   Personal history of colonic polyp-adenoma 11/24/2013   Renal insufficiency    mild   Thoracic spine fracture [facility])     Past Surgical History:  Procedure Laterality Date   ABDOMINAL AORTAGRAM N/A 02/23/2014   Procedure: ABDOMINAL Ronny Flurry;  Surgeon: Fransisco Hertz, MD;  Location: [facility] CATH LAB;  Service: Cardiovascular;  Laterality: N/A;   BIOPSY  08/20/2023   Procedure: BIOPSY;  Surgeon: Imogene Burn, MD;  Location: [facility] ENDOSCOPY;  Service: Gastroenterology;;   CATARACT EXTRACTION, BILATERAL     COLONOSCOPY     CORONARY ATHERECTOMY N/A 09/16/2023   Procedure: CORONARY ATHERECTOMY;  Surgeon: Swaziland, Peter M, MD;  Location: [facility] INVASIVE CV LAB;  Service: Cardiovascular;  Laterality: N/A;   CORONARY LITHOTRIPSY N/A 09/16/2023   Procedure: CORONARY LITHOTRIPSY;  Surgeon: Swaziland, Peter M, MD;  Location: [facility] INVASIVE CV LAB;  Service: Cardiovascular;  Laterality: N/A;   CORONARY STENT INTERVENTION N/A 09/16/2023   Procedure: CORONARY STENT INTERVENTION;  Surgeon: Swaziland, Peter M, MD;  Location: [facility] INVASIVE CV LAB;  Service: Cardiovascular;  Laterality: N/A;   CORONARY ULTRASOUND/IVUS N/A 09/16/2023   Procedure: Coronary Ultrasound/IVUS;  Surgeon: Swaziland, Peter M, MD;  Location: [facility] INVASIVE CV LAB;  Service:  Cardiovascular;  Laterality: N/A;   DEBRIDEMENT TENNIS ELBOW Right    ESOPHAGOGASTRODUODENOSCOPY     ESOPHAGOGASTRODUODENOSCOPY (EGD) WITH PROPOFOL N/A 08/20/2023   Procedure: ESOPHAGOGASTRODUODENOSCOPY (EGD) WITH PROPOFOL;  Surgeon: Imogene Burn, MD;  Location: [facility] ENDOSCOPY;  Service: Gastroenterology;  Laterality: N/A;   MESENTERIC ARTERY BYPASS N/A 03/10/2014   Procedure: AORTO TO SUPERIOR MESENTERIC ARTERY BYPASS;  Surgeon: Pryor Ochoa, MD;  Location: [facility] OR;   Service: Vascular;  Laterality: N/A;   RIGHT/LEFT HEART CATH AND CORONARY ANGIOGRAPHY N/A 09/11/2023   Procedure: RIGHT/LEFT HEART CATH AND CORONARY ANGIOGRAPHY;  Surgeon: Laurey Morale, MD;  Location: [facility] INVASIVE CV LAB;  Service: Cardiovascular;  Laterality: N/A;   TONSILLECTOMY AND ADENOIDECTOMY     vascualr surgery     illiac stents    Social History:   reports that he quit smoking about 14 years ago. His smoking use included cigarettes. He has been exposed to tobacco smoke. He has never used smokeless tobacco. He reports that he does not currently use alcohol. He reports that he does not currently use drugs.  No Known Allergies  Family History  Problem Relation Age of Onset   Diabetes Mother    Heart disease Mother        before age 65   Hyperlipidemia Mother    Hypertension Mother    AAA (abdominal aortic aneurysm) Mother    Stroke Father    Hypertension Father    Coronary artery disease Father    Hyperlipidemia Father    Diabetes Father    Bladder Cancer Father    Cancer Father    Heart disease Father    Heart attack Father    Colon cancer Neg Hx    Throat cancer Neg Hx    Kidney disease Neg Hx    Liver disease Neg Hx      Prior to Admission medications   Medication Sig Start Date End Date Taking? Authorizing Provider  FARXIGA 10 MG TABS tablet Take 10 mg by mouth every morning. 11/05/23  Yes [provider]  Accu-Chek Softclix Lancets lancets SMARTSIG:Topical 1-3 Times Daily 06/25/23   [provider]  acetaminophen (TYLENOL) 325 MG tablet Take 2 tablets (650 mg total) by mouth every 4 (four) hours as needed for headache or mild pain (pain score 1-3). 09/27/23   Lee, [place], NP  allopurinol (ZYLOPRIM) 100 MG tablet TAKE 1 TABLET 2 TO 3 TIMES A DAY FOR GOUT 11/19/23   Mechele Claude, MD  amiodarone (PACERONE) 200 MG tablet Take 1 tablet (200 mg total) by mouth daily. 09/28/23   Lee, [place], NP  apixaban (ELIQUIS) 5 MG TABS tablet Take 1 tablet (5 mg  total) by mouth 2 (two) times daily. 10/15/23   Mechele Claude, MD  atorvastatin (LIPITOR) 80 MG tablet Take 1 tablet (80 mg total) by mouth daily. 10/15/23   Mechele Claude, MD  Blood Glucose Monitoring Suppl (ACCU-CHEK GUIDE) w/Device KIT CHECK BLOOD SUGAR UP TO THREE TIMES DAILY AS DIRECTED 06/25/23   [provider]  Blood Glucose Monitoring Suppl DEVI 1 each by Does not apply route in the morning, at noon, and at bedtime. May substitute to any manufacturer covered by patient's insurance. 06/25/23   Mechele Claude, MD  BOOSTRIX 5-2.5-18.5 LF-MCG/0.5 injection  08/01/23   [provider]  Calcium Carb-Cholecalciferol (CALCIUM 500 + D PO) Take 1 tablet by mouth daily.    [provider]  carvedilol (COREG) 6.25 MG tablet TAKE ONE TABLET TWICE DAILY 11/19/23  Mechele Claude, MD  clopidogrel (PLAVIX) 75 MG tablet Take 1 tablet (75 mg total) by mouth daily with breakfast. 09/28/23   Lee, [place], NP  ferrous sulfate 325 (65 FE) MG tablet Take 1 tablet (325 mg total) by mouth daily. 09/28/23   Lee, [place], NP  furosemide (LASIX) 20 MG tablet TAKE ONE TABLET AS DIRECTED AS NEEDED FOR WEIGHT GAIN AND SWELLING 11/19/23   Mechele Claude, MD  pantoprazole (PROTONIX) 40 MG tablet TAKE ONE TABLET TWICE DAILY 11/21/23   Mechele Claude, MD  spironolactone (ALDACTONE) 25 MG tablet Take 0.5 tablets (12.5 mg total) by mouth daily. 09/03/23   Laurey Morale, MD    Physical Exam: Vitals:   11/26/23 1630 11/26/23 1700 11/26/23 1800 11/26/23 1900  BP: (!) 129/110 137/70 (!) 150/65 (!) 177/82  Pulse: 63 60 62 77  Resp: 19 (!) 22 (!) 23 20  Temp:    98.7 F (37.1 C)  TempSrc:      SpO2: 92% 96% 92% 91%  Weight:      Height:         Constitutional: NAD, no diaphoresis   Eyes: PERTLA, lids and conjunctivae normal ENMT: Mucous membranes are moist. Posterior pharynx clear of any exudate or lesions.   Neck: supple, no masses  Respiratory: Rhonchi bilaterally. No wheezing. No accessory muscle  use.  Cardiovascular: S1 & S2 heard, regular rate and rhythm. Pretibial pitting edema b/l.  Abdomen: no tenderness, soft. Bowel sounds active.  Musculoskeletal: no clubbing / cyanosis. No joint deformity upper and lower extremities.   Skin: no significant rashes, lesions, ulcers. Warm, dry, well-perfused. Neurologic: CN 2-12 grossly intact. Moving all extremities. Alert and oriented.  Psychiatric: Pleasant. Cooperative.    Labs and Imaging on Admission: I have personally reviewed following labs and imaging studies  CBC: Recent Labs  Lab 11/26/23 1336  WBC 4.2  NEUTROABS 2.8  HGB 9.7*  HCT 28.7*  MCV 87.2  PLT 197   Basic Metabolic Panel: Recent Labs  Lab 11/26/23 1336 11/26/23 1508  NA 126*  --   K 2.7*  --   CL 81*  --   CO2 31  --   GLUCOSE 145*  --   BUN 16  --   CREATININE 1.19  --   CALCIUM 8.7*  --   MG  --  1.7   GFR: Estimated Creatinine Clearance: 53.8 mL/min (by C-G formula based on SCr of 1.19 mg/dL). Liver Function Tests: Recent Labs  Lab 11/26/23 1336  AST 63*  ALT 54*  ALKPHOS 67  BILITOT 1.2  PROT 6.8  ALBUMIN 3.3*   No results for input(s): "LIPASE", "AMYLASE" in the last 168 hours. No results for input(s): "AMMONIA" in the last 168 hours. Coagulation Profile: No results for input(s): "INR", "PROTIME" in the last 168 hours. Cardiac Enzymes: No results for input(s): "CKTOTAL", "CKMB", "CKMBINDEX", "TROPONINI" in the last 168 hours. BNP (last 3 results) No results for input(s): "PROBNP" in the last 8760 hours. HbA1C: No results for input(s): "HGBA1C" in the last 72 hours. CBG: No results for input(s): "GLUCAP" in the last 168 hours. Lipid Profile: No results for input(s): "CHOL", "HDL", "LDLCALC", "TRIG", "CHOLHDL", "LDLDIRECT" in the last 72 hours. Thyroid Function Tests: No results for input(s): "TSH", "T4TOTAL", "FREET4", "T3FREE", "THYROIDAB" in the last 72 hours. Anemia Panel: No results for input(s): "VITAMINB12", "FOLATE",  "FERRITIN", "TIBC", "IRON", "RETICCTPCT" in the last 72 hours. Urine analysis:    Component Value Date/Time   COLORURINE YELLOW 11/26/2023 1320  APPEARANCEUR HAZY (A) 11/26/2023 1320   LABSPEC 1.012 11/26/2023 1320   PHURINE 7.0 11/26/2023 1320   GLUCOSEU >=500 (A) 11/26/2023 1320   HGBUR SMALL (A) 11/26/2023 1320   BILIRUBINUR NEGATIVE 11/26/2023 1320   KETONESUR NEGATIVE 11/26/2023 1320   PROTEINUR 100 (A) 11/26/2023 1320   UROBILINOGEN 1.0 03/04/2014 1358   NITRITE NEGATIVE 11/26/2023 1320   LEUKOCYTESUR LARGE (A) 11/26/2023 1320   Sepsis Labs: @LABRCNTIP (procalcitonin:4,lacticidven:4) ) Recent Results (from the past 240 hours)  Resp panel by RT-PCR (RSV, Flu A&B, Covid) Anterior Nasal Swab     Status: Abnormal   Collection Time: 11/26/23 12:59 PM   Specimen: Anterior Nasal Swab  Result Value Ref Range Status   SARS Coronavirus 2 by RT PCR NEGATIVE NEGATIVE Final    Comment: (NOTE) SARS-CoV-2 target nucleic acids are NOT DETECTED.  The SARS-CoV-2 RNA is generally detectable in upper respiratory specimens during the acute phase of infection. The lowest concentration of SARS-CoV-2 viral copies this assay can detect is 138 copies/mL. A negative result does not preclude SARS-Cov-2 infection and should not be used as the sole basis for treatment or other patient management decisions. A negative result may occur with  improper specimen collection/handling, submission of specimen other than nasopharyngeal swab, presence of viral mutation(s) within the areas targeted by this assay, and inadequate number of viral copies(<138 copies/mL). A negative result must be combined with clinical observations, patient history, and epidemiological information. The expected result is Negative.  Fact Sheet for Patients:  BloggerCourse.com  Fact Sheet for Healthcare Providers:  SeriousBroker.it  This test is no t yet approved or cleared by  the [place] FDA and  has been authorized for detection and/or diagnosis of SARS-CoV-2 by FDA under an Emergency Use Authorization (EUA). This EUA will remain  in effect (meaning this test can be used) for the duration of the COVID-19 declaration under Section 564(b)(1) of the Act, 21 U.S.C.section 360bbb-3(b)(1), unless the authorization is terminated  or revoked sooner.       Influenza A by PCR POSITIVE (A) NEGATIVE Final   Influenza B by PCR NEGATIVE NEGATIVE Final    Comment: (NOTE) The Xpert Xpress SARS-CoV-2/FLU/RSV plus assay is intended as an aid in the diagnosis of influenza from Nasopharyngeal swab specimens and should not be used as a sole basis for treatment. Nasal washings and aspirates are unacceptable for Xpert Xpress SARS-CoV-2/FLU/RSV testing.  Fact Sheet for Patients: BloggerCourse.com  Fact Sheet for Healthcare Providers: SeriousBroker.it  This test is not yet approved or cleared by the [place] FDA and has been authorized for detection and/or diagnosis of SARS-CoV-2 by FDA under an Emergency Use Authorization (EUA). This EUA will remain in effect (meaning this test can be used) for the duration of the COVID-19 declaration under Section 564(b)(1) of the Act, 21 U.S.C. section 360bbb-3(b)(1), unless the authorization is terminated or revoked.     Resp Syncytial Virus by PCR NEGATIVE NEGATIVE Final    Comment: (NOTE) Fact Sheet for Patients: BloggerCourse.com  Fact Sheet for Healthcare Providers: SeriousBroker.it  This test is not yet approved or cleared by the [place] FDA and has been authorized for detection and/or diagnosis of SARS-CoV-2 by FDA under an Emergency Use Authorization (EUA). This EUA will remain in effect (meaning this test can be used) for the duration of the COVID-19 declaration under Section 564(b)(1) of the Act, 21  U.S.C. section 360bbb-3(b)(1), unless the authorization is terminated or revoked.  Performed at [facility], [address] [place], [place] [address]  Radiological Exams on Admission: DG Chest Portable 1 View Result Date: 11/26/2023 CLINICAL DATA:  Several day history of weakness, cough, congestion EXAM: PORTABLE CHEST 1 VIEW COMPARISON:  Chest radiograph dated 09/12/2023 FINDINGS: Patient is rotated to the left. Normal lung volumes. Vascular calcifications project over the bilateral apices. Prominent bilateral interstitial opacities. Patchy left retrocardiac opacities. No pleural effusion or pneumothorax. Similar mildly enlarged cardiomediastinal silhouette. No acute osseous abnormality. IMPRESSION: 1. Prominent bilateral interstitial opacities, which may represent pulmonary edema. 2. Patchy left retrocardiac opacities, which may represent atelectasis, aspiration, or pneumonia. Electronically Signed   By: Agustin Cree M.D.   On: 11/26/2023 18:19    EKG: Independently reviewed. Sinus bradycardia, rate 56, 1st degree AV block, incomplete RBBB, LAFB.   Assessment/Plan   1. GI bleeding; acute on chronic anemia   - Hold Eliquis and Plavix for now, continue npo and IV PPI, trend H&H, transfuse if needed    2. Influenza A; pneumonia  - Influenza A pcr is positive; possible retrocardiac pneumonia on CXR  - Check procalcitonin, strep pneumo and legionella antigens,start Tamiflu, continue antibiotics for now, droplet precautions, supportive care    3. Hyponatremia  - Serum sodium 126  - He was given 1 liter NS in ED  - Check urine sodium and urine osmolality, hold diuretics for now, repeat chem panel in am   4. Hypokalemia  - Replacing  - Repeat chem panel in am   5. Chronic HFrEF  - Appears compensated   - Hold diuretics for now, continue Coreg as tolerated, monitor volume status   6. PAF  - Hold Eliquis, continue amiodarone and Coreg    7. CAD; elevated troponin  - Hx of DES to  LAD in December 2024  - No chest pain; troponin elevated likely reflects demand ischemia in setting of acute on chronic anemia and respiratory illness   - Continue Lipitor and Coreg   8. Type II DM  - A1c was 6.6% in November 2024   - Hold Farxiga, check CBGs, and use low-intensity SSI for now     DVT prophylaxis: SCDs  Code Status: Full  Level of Care: Level of care: Telemetry Family Communication: None present  Disposition Plan:  Patient is from: home  Anticipated d/c is to: TBD Anticipated d/c date is: 11/30/23  Patient currently: Pending stable H&H, improved electrolytes, improvement in generalized weakness, may need PT eval  Consults called: GI   Admission status: Inpatient     Briscoe Deutscher, MD [facility]  11/26/2023, 7:38 PM

## 2023-11-26 NOTE — ED Triage Notes (Signed)
 Pt arrived from home via [facility] c/o weakness, cough, congestion that began this past Thursday. Pt reports he has been having trouble ambulating at home and has fallen recently.

## 2023-11-27 ENCOUNTER — Inpatient Hospital Stay (HOSPITAL_COMMUNITY)

## 2023-11-27 ENCOUNTER — Encounter: Payer: Self-pay | Admitting: Family Medicine

## 2023-11-27 DIAGNOSIS — K921 Melena: Secondary | ICD-10-CM

## 2023-11-27 DIAGNOSIS — D649 Anemia, unspecified: Secondary | ICD-10-CM | POA: Diagnosis not present

## 2023-11-27 DIAGNOSIS — R7989 Other specified abnormal findings of blood chemistry: Secondary | ICD-10-CM | POA: Diagnosis not present

## 2023-11-27 DIAGNOSIS — M0579 Rheumatoid arthritis with rheumatoid factor of multiple sites without organ or systems involvement: Secondary | ICD-10-CM | POA: Diagnosis not present

## 2023-11-27 DIAGNOSIS — I5020 Unspecified systolic (congestive) heart failure: Secondary | ICD-10-CM | POA: Diagnosis not present

## 2023-11-27 DIAGNOSIS — K922 Gastrointestinal hemorrhage, unspecified: Secondary | ICD-10-CM | POA: Diagnosis not present

## 2023-11-27 LAB — COMPREHENSIVE METABOLIC PANEL
ALT: 47 U/L — ABNORMAL HIGH (ref 0–44)
AST: 53 U/L — ABNORMAL HIGH (ref 15–41)
Albumin: 2.8 g/dL — ABNORMAL LOW (ref 3.5–5.0)
Alkaline Phosphatase: 59 U/L (ref 38–126)
Anion gap: 10 (ref 5–15)
BUN: 14 mg/dL (ref 8–23)
CO2: 28 mmol/L (ref 22–32)
Calcium: 7.6 mg/dL — ABNORMAL LOW (ref 8.9–10.3)
Chloride: 89 mmol/L — ABNORMAL LOW (ref 98–111)
Creatinine, Ser: 0.97 mg/dL (ref 0.61–1.24)
GFR, Estimated: >60 mL/min (ref >60)
Glucose, Bld: 134 mg/dL — ABNORMAL HIGH (ref 70–99)
Potassium: 2.4 mmol/L — CL (ref 3.5–5.1)
Sodium: 127 mmol/L — ABNORMAL LOW (ref 135–145)
Total Bilirubin: 0.9 mg/dL (ref 0.0–1.2)
Total Protein: 5.7 g/dL — ABNORMAL LOW (ref 6.5–8.1)

## 2023-11-27 LAB — MAGNESIUM: Magnesium: 1.8 mg/dL (ref 1.7–2.4)

## 2023-11-27 LAB — CBC
HCT: 24.1 % — ABNORMAL LOW (ref 39.0–52.0)
HCT: 28.1 % — ABNORMAL LOW (ref 39.0–52.0)
HCT: 25.2 % — ABNORMAL LOW (ref 39.0–52.0)
HCT: 27 % — ABNORMAL LOW (ref 39.0–52.0)
Hemoglobin: 8 g/dL — ABNORMAL LOW (ref 13.0–17.0)
Hemoglobin: 9.2 g/dL — ABNORMAL LOW (ref 13.0–17.0)
Hemoglobin: 8.2 g/dL — ABNORMAL LOW (ref 13.0–17.0)
Hemoglobin: 8.7 g/dL — ABNORMAL LOW (ref 13.0–17.0)
MCH: 29.1 pg (ref 26.0–34.0)
MCH: 28.5 pg (ref 26.0–34.0)
MCH: 28.9 pg (ref 26.0–34.0)
MCH: 29.1 pg (ref 26.0–34.0)
MCHC: 33.2 g/dL (ref 30.0–36.0)
MCHC: 32.7 g/dL (ref 30.0–36.0)
MCHC: 32.5 g/dL (ref 30.0–36.0)
MCHC: 32.2 g/dL (ref 30.0–36.0)
MCV: 87.6 fL (ref 80.0–100.0)
MCV: 87 fL (ref 80.0–100.0)
MCV: 88.7 fL (ref 80.0–100.0)
MCV: 90.3 fL (ref 80.0–100.0)
Platelets: 144 10*3/uL — ABNORMAL LOW (ref 150–400)
Platelets: 171 10*3/uL (ref 150–400)
Platelets: 152 10*3/uL (ref 150–400)
Platelets: 153 10*3/uL (ref 150–400)
RBC: 2.75 MIL/uL — ABNORMAL LOW (ref 4.22–5.81)
RBC: 3.23 MIL/uL — ABNORMAL LOW (ref 4.22–5.81)
RBC: 2.84 MIL/uL — ABNORMAL LOW (ref 4.22–5.81)
RBC: 2.99 MIL/uL — ABNORMAL LOW (ref 4.22–5.81)
RDW: 15.8 % — ABNORMAL HIGH (ref 11.5–15.5)
RDW: 15.6 % — ABNORMAL HIGH (ref 11.5–15.5)
RDW: 15.8 % — ABNORMAL HIGH (ref 11.5–15.5)
RDW: 15.9 % — ABNORMAL HIGH (ref 11.5–15.5)
WBC: 3.5 10*3/uL — ABNORMAL LOW (ref 4.0–10.5)
WBC: 7.5 10*3/uL (ref 4.0–10.5)
WBC: 5.4 10*3/uL (ref 4.0–10.5)
WBC: 6.2 10*3/uL (ref 4.0–10.5)
nRBC: 0 % (ref 0.0–0.2)
nRBC: 0 % (ref 0.0–0.2)
nRBC: 0 % (ref 0.0–0.2)
nRBC: 0 % (ref 0.0–0.2)

## 2023-11-27 LAB — GLUCOSE, CAPILLARY
Glucose-Capillary: 106 mg/dL — ABNORMAL HIGH (ref 70–99)
Glucose-Capillary: 120 mg/dL — ABNORMAL HIGH (ref 70–99)
Glucose-Capillary: 125 mg/dL — ABNORMAL HIGH (ref 70–99)
Glucose-Capillary: 139 mg/dL — ABNORMAL HIGH (ref 70–99)
Glucose-Capillary: 140 mg/dL — ABNORMAL HIGH (ref 70–99)
Glucose-Capillary: 140 mg/dL — ABNORMAL HIGH (ref 70–99)
Glucose-Capillary: 162 mg/dL — ABNORMAL HIGH (ref 70–99)

## 2023-11-27 LAB — HEPATITIS PANEL, ACUTE
HCV Ab: NONREACTIVE
Hep A IgM: NONREACTIVE
Hep B C IgM: NONREACTIVE
Hepatitis B Surface Ag: NONREACTIVE

## 2023-11-27 LAB — BASIC METABOLIC PANEL
Anion gap: 10 (ref 5–15)
BUN: 13 mg/dL (ref 8–23)
CO2: 27 mmol/L (ref 22–32)
Calcium: 7.7 mg/dL — ABNORMAL LOW (ref 8.9–10.3)
Chloride: 91 mmol/L — ABNORMAL LOW (ref 98–111)
Creatinine, Ser: 0.91 mg/dL (ref 0.61–1.24)
GFR, Estimated: >60 mL/min (ref >60)
Glucose, Bld: 113 mg/dL — ABNORMAL HIGH (ref 70–99)
Potassium: 3 mmol/L — ABNORMAL LOW (ref 3.5–5.1)
Sodium: 128 mmol/L — ABNORMAL LOW (ref 135–145)

## 2023-11-27 LAB — PROCALCITONIN
Procalcitonin: <0.1 ng/mL
Procalcitonin: <0.1 ng/mL

## 2023-11-27 LAB — OSMOLALITY, URINE: Osmolality, Ur: 370 mosm/kg (ref 300–900)

## 2023-11-27 LAB — MRSA NEXT GEN BY PCR, NASAL: MRSA by PCR Next Gen: NOT DETECTED

## 2023-11-27 LAB — STREP PNEUMONIAE URINARY ANTIGEN: Strep Pneumo Urinary Antigen: NEGATIVE

## 2023-11-27 MED ORDER — POTASSIUM CHLORIDE 10 MEQ/100ML IV SOLN
10.0000 meq | INTRAVENOUS | Status: AC
Start: 2023-11-27 — End: 2023-11-27
  Administered 2023-11-27 (×5): 10 meq via INTRAVENOUS
  Filled 2023-11-27 (×5): qty 100

## 2023-11-27 MED ORDER — ALBUTEROL SULFATE (2.5 MG/3ML) 0.083% IN NEBU
2.5000 mg | INHALATION_SOLUTION | Freq: Four times a day (QID) | RESPIRATORY_TRACT | Status: DC | PRN
  Administered 2023-11-27: 2.5 mg via RESPIRATORY_TRACT
  Filled 2023-11-27: qty 3

## 2023-11-27 MED ORDER — POTASSIUM CHLORIDE 20 MEQ PO PACK
40.0000 meq | PACK | Freq: Once | ORAL | Status: AC
  Administered 2023-11-27: 40 meq via ORAL
  Filled 2023-11-27: qty 2

## 2023-11-27 MED ORDER — HYDRALAZINE HCL 20 MG/ML IJ SOLN
10.0000 mg | Freq: Four times a day (QID) | INTRAMUSCULAR | Status: DC | PRN
  Administered 2023-11-28: 10 mg via INTRAVENOUS
  Filled 2023-11-27: qty 1

## 2023-11-27 MED ORDER — CHLORHEXIDINE GLUCONATE CLOTH 2 % EX PADS
6.0000 | MEDICATED_PAD | Freq: Every day | CUTANEOUS | Status: DC
  Administered 2023-11-27 – 2023-12-02 (×6): 6 via TOPICAL

## 2023-11-27 MED ORDER — MAGNESIUM SULFATE IN D5W 1-5 GM/100ML-% IV SOLN
1.0000 g | Freq: Once | INTRAVENOUS | Status: AC
  Administered 2023-11-27: 1 g via INTRAVENOUS
  Filled 2023-11-27: qty 100

## 2023-11-27 MED ORDER — PNEUMOCOCCAL 20-VAL CONJ VACC 0.5 ML IM SUSY
0.5000 mL | PREFILLED_SYRINGE | INTRAMUSCULAR | Status: DC
Start: 2023-11-28 — End: 2023-11-27

## 2023-11-27 MED ORDER — OSELTAMIVIR PHOSPHATE 75 MG PO CAPS
75.0000 mg | ORAL_CAPSULE | Freq: Two times a day (BID) | ORAL | Status: AC
  Administered 2023-11-27 – 2023-12-01 (×8): 75 mg via ORAL
  Filled 2023-11-27 (×8): qty 1

## 2023-11-27 MED ORDER — LABETALOL HCL 5 MG/ML IV SOLN: 10.0000 mg | INTRAVENOUS | Status: DC | PRN

## 2023-11-27 NOTE — Consult Note (Signed)
 Gastroenterology Consult   Referring Provider: No ref. provider found Primary Care Physician:  Mechele Claude, MD Primary Gastroenterologist:  Dr. Leone Payor   Patient ID: Connor Bryant; [id number]; Oct 07, 1945   Admit date: 11/26/2023  LOS: 1 day   Date of Consultation: 11/27/2023  Reason for Consultation:  anemia, melena   History of Present Illness   Connor Bryant is a 78 y.o. year old male with history of RA on methotrexate and prednisone, A-fib on Eliquis, previous GI bleed in November 2024, Diabetes type 2, CVA, colon polyps who presented to the ED yesterday with abdominal pain cough and diarrhea.  Patient reported cough congestion generalized weakness for the past 5 days.  Reported darker stools recently as well.  Hemoglobin down to 9.7 on admission, from 11.6 2 months ago, with occult stool positive.  GI consulted for evaluation  ED course: Troponin 161 Hemoglobin 9.7 Potassium 2.7, sodium 126 AST 63 ALT 54 BNP 858 FOBT positive  Consult: Notably patient had admission to the hospital in November after he presented with syncopal episode, noted to have melena, therefore patient was seen by [facility].  Hemoglobin at that time was 7.5, FOBT positive patient underwent EGD which was suggestive of Barrett's esophagus, gastritis, nausea and Duodenal Bulb.  Was Suspected That Bleeding May Have Been Secondary to Gastritis in Setting of Eliquis Use. Was started on PPI BID x8 weeks then daily thereafter. He followed up with GI as outpatient in December and reported some a.m. fatigue, no further melena, maintained on daily iron pill and PPI BID, advised to go down to once daily PPI dosing as labs had improved.   Last iron studies in December 2024 with ferritin 673, iron 38, TIBC 231, saturation 17.  Patient reports generally feeling unwell since Friday with weakness, low grade temp, cough. He reports dark stools for the past 2-3 weeks without any abdominal pain, nausea, vomiting or  rectal bleeding. He reports some issues with mild reflux recently, unsure if he is taking protonix anymore at home, thinks he likely is not taking this. Has had some diarrhea starting Monday noting very soft stools but no watery stools. Appetite has been decreased since Friday but normal prior to that. No weight loss.   He is maintained on eliquis at home. Last took this Tuesday morning. He is not taking any NSAIDs. Does not drink or smoke.   Last colonoscopy: 10/2013 2 small ulcers in ascending colon, multiple biopsies performed, sessile polyp measuring 5 mm in the rectum, diverticulosis in sigmoid colon. Path and a polyp consistent with tubular adenoma right colon biopsy showed benign mucosa with surface erosion    Past Medical History:  Diagnosis Date   Acute CVA (cerebrovascular accident) (HCC) 05/01/2020   Acute respiratory failure with hypoxia (HCC) 05/04/2020   Arthritis    Atrial fibrillation (HCC)    DM type 2 (diabetes mellitus, type 2) (HCC)    no meds   Endotracheally intubated    Gallstone    Heart murmur    was told one time he had a murmur, not since 30 years ago   HTN (hypertension)    Hyperlipidemia    Myocardial infarct, old    2021   Personal history of colonic polyp-adenoma 11/24/2013   Renal insufficiency    mild   Thoracic spine fracture [facility])     Past Surgical History:  Procedure Laterality Date   ABDOMINAL AORTAGRAM N/A 02/23/2014   Procedure: ABDOMINAL Ronny Flurry;  Surgeon: Fransisco Hertz, MD;  Location: [facility] CATH LAB;  Service: Cardiovascular;  Laterality: N/A;   BIOPSY  08/20/2023   Procedure: BIOPSY;  Surgeon: Imogene Burn, MD;  Location: [facility] ENDOSCOPY;  Service: Gastroenterology;;   CATARACT EXTRACTION, BILATERAL     COLONOSCOPY     CORONARY ATHERECTOMY N/A 09/16/2023   Procedure: CORONARY ATHERECTOMY;  Surgeon: Swaziland, Peter M, MD;  Location: [facility] INVASIVE CV LAB;  Service: Cardiovascular;  Laterality: N/A;   CORONARY LITHOTRIPSY N/A 09/16/2023    Procedure: CORONARY LITHOTRIPSY;  Surgeon: Swaziland, Peter M, MD;  Location: [facility] INVASIVE CV LAB;  Service: Cardiovascular;  Laterality: N/A;   CORONARY STENT INTERVENTION N/A 09/16/2023   Procedure: CORONARY STENT INTERVENTION;  Surgeon: Swaziland, Peter M, MD;  Location: [facility] INVASIVE CV LAB;  Service: Cardiovascular;  Laterality: N/A;   CORONARY ULTRASOUND/IVUS N/A 09/16/2023   Procedure: Coronary Ultrasound/IVUS;  Surgeon: Swaziland, Peter M, MD;  Location: [facility] INVASIVE CV LAB;  Service: Cardiovascular;  Laterality: N/A;   DEBRIDEMENT TENNIS ELBOW Right    ESOPHAGOGASTRODUODENOSCOPY     ESOPHAGOGASTRODUODENOSCOPY (EGD) WITH PROPOFOL N/A 08/20/2023   Procedure: ESOPHAGOGASTRODUODENOSCOPY (EGD) WITH PROPOFOL;  Surgeon: Imogene Burn, MD;  Location: [facility] ENDOSCOPY;  Service: Gastroenterology;  Laterality: N/A;   MESENTERIC ARTERY BYPASS N/A 03/10/2014   Procedure: AORTO TO SUPERIOR MESENTERIC ARTERY BYPASS;  Surgeon: Pryor Ochoa, MD;  Location: [facility] OR;  Service: Vascular;  Laterality: N/A;   RIGHT/LEFT HEART CATH AND CORONARY ANGIOGRAPHY N/A 09/11/2023   Procedure: RIGHT/LEFT HEART CATH AND CORONARY ANGIOGRAPHY;  Surgeon: Laurey Morale, MD;  Location: [facility] INVASIVE CV LAB;  Service: Cardiovascular;  Laterality: N/A;   TONSILLECTOMY AND ADENOIDECTOMY     vascualr surgery     illiac stents    Prior to Admission medications   Medication Sig Start Date End Date Taking? Authorizing Provider  Accu-Chek Softclix Lancets lancets SMARTSIG:Topical 1-3 Times Daily 06/25/23   [provider]  acetaminophen (TYLENOL) 325 MG tablet Take 2 tablets (650 mg total) by mouth every 4 (four) hours as needed for headache or mild pain (pain score 1-3). 09/27/23   Lee, [place], NP  allopurinol (ZYLOPRIM) 100 MG tablet TAKE 1 TABLET 2 TO 3 TIMES A DAY FOR GOUT 11/19/23   Mechele Claude, MD  amiodarone (PACERONE) 200 MG tablet Take 1 tablet (200 mg total) by mouth daily. 09/28/23   Lee, [place], NP  apixaban (ELIQUIS) 5 MG  TABS tablet Take 1 tablet (5 mg total) by mouth 2 (two) times daily. 10/15/23   Mechele Claude, MD  atorvastatin (LIPITOR) 80 MG tablet Take 1 tablet (80 mg total) by mouth daily. 10/15/23   Mechele Claude, MD  Blood Glucose Monitoring Suppl (ACCU-CHEK GUIDE) w/Device KIT CHECK BLOOD SUGAR UP TO THREE TIMES DAILY AS DIRECTED 06/25/23   [provider]  Blood Glucose Monitoring Suppl DEVI 1 each by Does not apply route in the morning, at noon, and at bedtime. May substitute to any manufacturer covered by patient's insurance. 06/25/23   Mechele Claude, MD  BOOSTRIX 5-2.5-18.5 LF-MCG/0.5 injection  08/01/23   [provider]  Calcium Carb-Cholecalciferol (CALCIUM 500 + D PO) Take 1 tablet by mouth daily.    [provider]  carvedilol (COREG) 6.25 MG tablet TAKE ONE TABLET TWICE DAILY 11/19/23   Mechele Claude, MD  clopidogrel (PLAVIX) 75 MG tablet Take 1 tablet (75 mg total) by mouth daily with breakfast. 09/28/23   Lee, [place], NP  FARXIGA 10 MG TABS tablet Take 10 mg by mouth every morning. 11/05/23   [provider]  ferrous sulfate 325 (65 FE) MG tablet Take 1 tablet (325 mg total) by mouth daily. 09/28/23   Lee, [place], NP  furosemide (LASIX) 20 MG tablet TAKE ONE TABLET AS DIRECTED AS NEEDED FOR WEIGHT GAIN AND SWELLING 11/19/23   Mechele Claude, MD  pantoprazole (PROTONIX) 40 MG tablet TAKE ONE TABLET TWICE DAILY 11/21/23   Mechele Claude, MD  spironolactone (ALDACTONE) 25 MG tablet Take 0.5 tablets (12.5 mg total) by mouth daily. 09/03/23   Laurey Morale, MD    Current Facility-Administered Medications  Medication Dose Route Frequency Provider Last Rate Last Admin   acetaminophen (TYLENOL) tablet 650 mg  650 mg Oral Q6H PRN Opyd, Lavone Neri, MD       Or   acetaminophen (TYLENOL) suppository 650 mg  650 mg Rectal Q6H PRN Opyd, Lavone Neri, MD       albuterol (PROVENTIL) (2.5 MG/3ML) 0.083% nebulizer solution 2.5 mg  2.5 mg Nebulization Q6H PRN Opyd, Lavone Neri, MD   2.5  mg at 11/27/23 0703   amiodarone (PACERONE) tablet 200 mg  200 mg Oral Daily Opyd, Lavone Neri, MD   200 mg at 11/27/23 1610   atorvastatin (LIPITOR) tablet 80 mg  80 mg Oral Daily Opyd, Lavone Neri, MD   80 mg at 11/27/23 9604   carvedilol (COREG) tablet 6.25 mg  6.25 mg Oral BID Briscoe Deutscher, MD   6.25 mg at 11/27/23 5409   cefTRIAXone (ROCEPHIN) 2 g in sodium chloride 0.9 % 100 mL IVPB  2 g Intravenous Q24H Opyd, Lavone Neri, MD       Chlorhexidine Gluconate Cloth 2 % PADS 6 each  6 each Topical Daily Dezii, Alexandra, DO   6 each at 11/27/23 0824   doxycycline (VIBRAMYCIN) 100 mg in sodium chloride 0.9 % 250 mL IVPB  100 mg Intravenous Q12H Briscoe Deutscher, MD 125 mL/hr at 11/27/23 0116 100 mg at 11/27/23 0116   guaiFENesin (ROBITUSSIN) 100 MG/5ML liquid 5 mL  5 mL Oral Q4H PRN Opyd, Lavone Neri, MD       hydrALAZINE (APRESOLINE) injection 10 mg  10 mg Intravenous Q6H PRN Dezii, Alexandra, DO       insulin aspart (novoLOG) injection 0-6 Units  0-6 Units Subcutaneous Q4H Opyd, Lavone Neri, MD   1 Units at 11/27/23 0841   labetalol (NORMODYNE) injection 10 mg  10 mg Intravenous Q2H PRN Dezii, Alexandra, DO       oseltamivir (TAMIFLU) capsule 30 mg  30 mg Oral BID Opyd, Lavone Neri, MD   30 mg at 11/27/23 8119   oxyCODONE (Oxy IR/ROXICODONE) immediate release tablet 5 mg  5 mg Oral Q4H PRN Opyd, Lavone Neri, MD   5 mg at 11/27/23 0741   pantoprazole (PROTONIX) injection 40 mg  40 mg Intravenous Q12H Opyd, Lavone Neri, MD   40 mg at 11/27/23 1478   potassium chloride 10 mEq in 100 mL IVPB  10 mEq Intravenous Q1 Hr x 5 Opyd, Timothy S, MD 100 mL/hr at 11/27/23 0830 10 mEq at 11/27/23 0830   sodium chloride flush (NS) 0.9 % injection 3 mL  3 mL Intravenous Q12H Opyd, Lavone Neri, MD   3 mL at 11/27/23 0830    Allergies as of 11/26/2023   (No Known Allergies)    Family History  Problem Relation Age of Onset   Diabetes Mother    Heart disease Mother        before age 73   Hyperlipidemia Mother  Hypertension Mother    AAA (abdominal aortic aneurysm) Mother    Stroke Father    Hypertension Father    Coronary artery disease Father    Hyperlipidemia Father    Diabetes Father    Bladder Cancer Father    Cancer Father    Heart disease Father    Heart attack Father    Colon cancer Neg Hx    Throat cancer Neg Hx    Kidney disease Neg Hx    Liver disease Neg Hx     Social History   Socioeconomic History   Marital status: Widowed    Spouse name: Not on file   Number of children: 1   Years of education: Not on file   Highest education level: High school graduate  Occupational History   Occupation: Retired  Tobacco Use   Smoking status: Former    Current packs/day: 0.00    Types: Cigarettes    Quit date: 07/08/2009    Years since quitting: 14.3    Passive exposure: Past   Smokeless tobacco: Never  Vaping Use   Vaping status: Never Used  Substance and Sexual Activity   Alcohol use: Not Currently   Drug use: Not Currently   Sexual activity: Yes  Other Topics Concern   Not on file  Social History Narrative   Patient is married, he is retired. 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   Social Drivers of Health   Financial Resource Strain: Low Risk  (08/19/2023)   Overall Financial Resource Strain (CARDIA)    Difficulty of Paying Living Expenses: Not very hard  Food Insecurity: No Food Insecurity (11/26/2023)   Hunger Vital Sign    Worried About Running Out of Food in the Last Year: Never true    Ran Out of Food in the Last Year: Never true  Transportation Needs: No Transportation Needs (11/26/2023)   PRAPARE - Administrator, Civil Service (Medical): No    Lack of Transportation (Non-Medical): No  Physical Activity: Insufficiently Active (12/03/2022)   Exercise Vital Sign    Days of Exercise per Week: 3 days    Minutes of Exercise per Session: 30 min  Stress: No Stress Concern Present (12/03/2022)   [employer] of Occupational Health -  Occupational Stress Questionnaire    Feeling of Stress : Not at all  Social Connections: Moderately Isolated (09/24/2023)   Social Connection and Isolation Panel [NHANES]    Frequency of Communication with Friends and Family: More than three times a week    Frequency of Social Gatherings with Friends and Family: More than three times a week    Attends Religious Services: Never    Database administrator or Organizations: No    Attends Banker Meetings: Never    Marital Status: Married  Catering manager Violence: Not At Risk (11/26/2023)   Humiliation, Afraid, Rape, and Kick questionnaire    Fear of Current or Ex-Partner: No    Emotionally Abused: No    Physically Abused: No    Sexually Abused: No     Review of Systems   Gen: Denies any fever, chills, loss of appetite, change in weight or weight loss CV: Denies chest pain, heart palpitations, syncope, edema  Resp: Denies shortness of breath with rest, cough, wheezing, coughing up blood, and pleurisy. ZO:XWRUEA  hematochezia, nausea, vomiting,  constipation, dysphagia, odyonophagia, early satiety or weight loss. +loose stools +melena +reflux symptoms  GU : Denies urinary burning, blood  in urine, urinary frequency, and urinary incontinence. MS: Denies joint pain, limitation of movement, swelling, cramps, and atrophy.  Derm: Denies rash, itching, dry skin, hives. Psych: Denies depression, anxiety, memory loss, hallucinations, and confusion. Heme: Denies bruising or bleeding Neuro:  Denies any headaches, dizziness, paresthesias, shaking  Physical Exam   Vital Signs in last 24 hours: Temp:  [98 F (36.7 C)-98.7 F (37.1 C)] 98.5 F (36.9 C) (03/05 0723) Pulse Rate:  [48-78] 78 (03/05 0723) Resp:  [14-34] 34 (03/05 0723) BP: (122-196)/(48-110) 196/87 (03/05 0723) SpO2:  [86 %-100 %] 97 % (03/05 0723) Weight:  [83.5 kg] 83.5 kg (03/04 1259) Last BM Date : 11/26/23  General:   Alert,  Well-developed, well-nourished,  pleasant and cooperative in NAD Head:  Normocephalic and atraumatic. Eyes:  Sclera clear, no icterus.   Conjunctiva pink. Ears:  Normal auditory acuity. Mouth:  No deformity or lesions, dentition normal. Lungs: wheezing throughout  Heart:  Regular rate and rhythm; no murmurs, clicks, rubs,  or gallops. Abdomen:  Soft, nontender and nondistended. No masses, hepatosplenomegaly or hernias noted. Normal bowel sounds, without guarding, and without rebound.   Msk:  Symmetrical without gross deformities. Normal posture. Extremities:  Without clubbing or edema. Neurologic:  Alert and  oriented x4. Skin:  Intact without significant lesions or rashes. Psych:  Alert and cooperative. Normal mood and affect.  Labs/Studies   Recent Labs Recent Labs    11/26/23 2019 11/27/23 0139 11/27/23 0736  WBC 3.8* 3.5* 7.5  HGB 9.4* 8.0* 9.2*  HCT 28.5* 24.1* 28.1*  PLT 180 144* 171   BMET Recent Labs    11/26/23 1336 11/27/23 0139  NA 126* 127*  K 2.7* 2.4*  CL 81* 89*  CO2 31 28  GLUCOSE 145* 134*  BUN 16 14  CREATININE 1.19 0.97  CALCIUM 8.7* 7.6*   LFT Recent Labs    11/26/23 1336 11/27/23 0139  PROT 6.8 5.7*  ALBUMIN 3.3* 2.8*  AST 63* 53*  ALT 54* 47*  ALKPHOS 67 59  BILITOT 1.2 0.9    Assessment   RAHEEN CAPILI is a 78 y.o. year old male with history of RA on methotrexate and prednisone, A-fib on Eliquis, previous GI bleed in November 2024, Diabetes type 2, CVA, colon polyps who presented to the ED yesterday with abdominal pain cough and diarrhea.  Reported darker stools, Hemoglobin 9.7 on admission with occult stool positive.  GI consulted for evaluation.  Melena/anemia: history of UGIB In November 2024, thought secondary to gastritis exacerbated by use of eliquis. Advised to continue on PPI therapy when seen as outpatient in December by [facility] though patient reports he is unsure if he is taking this, does not think he is.  Here this admission with Flu A and reported darker  stools. FOBT positive. Fortunately hemoglobin is stable around 9, declined to as low as 8 yesterday but improved today to 9.2. He reports some GERD symptoms recently but denies abdominal pain, nausea, vomiting, early satiety. Suspect we may again be dealing with gastritis exacerbation in setting of chronic ACs and no PPI therapy. Would recommend PPI BID, continue to trend h&h and monitor for overt GI bleeding.   Unfortunately, patient's respiratory status has declined and he was moved to ICU due to oxygen requirements. Not a candidate for EGD at this time. May consider EGD inpatient once respiratory status improved, or if blood counts stable and melena tapers off with initiation of PPI therapy, could consider outpatient EGD with his primary GI team.  Plan / Recommendations   PPI BID Hold eliquis Monitor for overt GI bleeding Trend h&h, transfuse for hgb <7 Consider EGD once clinically stable, inpatient vs. Outpatient pending clinical course      11/27/2023, 8:56 AM  Rosco Harriott L. Jeanmarie Hubert, MSN, APRN, AGNP-C Adult-Gerontology Nurse Practitioner [facility]

## 2023-11-27 NOTE — Progress Notes (Signed)
 PROGRESS NOTE    Connor Bryant  BJY:[id number] DOB: 1946-07-08 DOA: 11/26/2023 PCP: Mechele Claude, MD  Chief Complaint  Patient presents with   Weakness    Hospital Course:  Connor Bryant is 78 y.o. male with diabetes, hypertension, hyperlipidemia, CAD, chronic heart failure with reduced EF, paroxysmal A-fib on Eliquis, presents with generalized weakness, cough, shortness of breath.  He denies any blood in his stool but does endorse that it has been darker than normal.  He has also recently had bleeding from his nose and mouth and discontinued his Plavix for the last month.  In the ED patient was found to be saturating the low 90s on room air, EKG reportedly revealed sinus bradycardia with first-degree AV block, PACs, incomplete RBBB and LAFB.  Chest x-ray revealed prominent bilateral interstitial opacities with retrocardiac opacity.  Labs reveal influenza A positive, positive FOBT, sodium 126, potassium 2.7, hemoglobin 9.7, troponin 161.  Gastroenterology Dr. Levon Hedger was consulted by EDP.  Patient was initiated on antibiotics, IV Protonix, IV potassium, IV fluids.  Subjective: This morning patient has increased work of breathing.  He is now on 11 L, maintaining O2 sats at 97%.  He is tachypneic and using accessory muscles.  He reports that he is getting tired and would like some help with breathing.  We discussed BiPAP and he is amendable.   Objective: Vitals:   11/26/23 2149 11/27/23 0031 11/27/23 0435 11/27/23 0723  BP: (!) 173/64 (!) 149/61 (!) 157/76 (!) 196/87  Pulse: 61 60 (!) 59 78  Resp: 20 18 19  (!) 34  Temp: 98.7 F (37.1 C) 98 F (36.7 C) 98.2 F (36.8 C) 98.5 F (36.9 C)  TempSrc:  Oral Oral Oral  SpO2: 100% (!) 86% (!) 89% 97%  Weight:      Height: 5\' 8"  (1.727 m)       Intake/Output Summary (Last 24 hours) at 11/27/2023 0747 Last data filed at 11/27/2023 0500 Gross per 24 hour  Intake --  Output 600 ml  Net -600 ml   Filed Weights   11/26/23 1259  Weight:  83.5 kg    Examination: General exam: Calm, pleasant, NAD but looking fatigued Respiratory system: + work of breathing, tachypnea, rhonchi bilaterally, 97% on 11L Cardiovascular system: S1 & S2 heard, irregular rhythm Gastrointestinal system: Abdomen is nondistended, soft and nontender.  Neuro: Alert and oriented. No focal neurological deficits. Extremities: Symmetric, expected ROM Skin: No rashes, lesions Psychiatry: Demonstrates appropriate judgement and insight. Mood & affect appropriate for situation.   Assessment & Plan:  Principal Problem:   GI bleeding Active Problems:   Type 2 diabetes mellitus without complication, without long-term current use of insulin (HCC)   Elevated troponin   Rheumatoid arthritis involving multiple sites with positive rheumatoid factor (HCC)   Symptomatic anemia   Paroxysmal atrial fibrillation (HCC)   HFrEF (heart failure with reduced ejection fraction) (HCC)   Coronary artery disease involving native coronary artery of native heart without angina pectoris   Hyponatremia   Hypokalemia   Influenza A    Acute hypoxic respiratory failure Acute worsening this AM - Secondary to influenza pneumonia as below - Chest x-ray ordered to evaluate for volume overload given patient's history of heart failure and IV fluids received in the ED - Have ordered for as needed BiPAP for now. Transfer to ICU  Continue close monitoring  Influenza A Pneumonia - Influenza A PCR positive - Pneumonia seen on CXR - Procalcitonin is unremarkable, but given worsening respiratory status  will continue antibiotics for now. On Doxy and ceft - Strep pneumo and Legionella antigens pending - On Tamiflu, will continue - Droplet precautions - Continue supportive care  GI bleed, melena, FOBT + Acute on chronic anemia - Takes Eliquis and Plavix at home.  Stopped Plavix 1 month ago due to epistaxis. Hold Eliquis for now as well. - Hemoglobin downtrending baseline 11.4, down to  9.4 on arrival.  Appears to be stable at this time - GI consulted, will need to stabilize respiratory status prior to endoscopy.  Have alerted GI as to this change - Continue with PPI for now  Hyponatremia - Sodium 126 on arrival - Status post 1 L NS in ED - Urine sodium, urine osmole's: - Hold diuretics for now - Repeat CMP this afternoon  Hypokalemia - Continue IV replacement - Continue to trend  Chronic heart failure with reduced EF - Echo as of 08/2023: LVEF 30 to 35%, mild left ventricular hypertrophy, global hypokinesis, left atrial dilation, right atrial dilation, mild MVR, moderate AVS. - Continue carvedilol for now - Continue to monitor volume status closely. BNP 858. Currently euvolemic but did receive some fluids in the ED - Chest x-ray as above - Hold diuretics for now   Paroxysmal Afib  - Holding Eliquis as above - Continue amiodarone and Coreg  CAD Elevated Troponin - History of DES to LAD in December 2024 -- Trop 161 --> 148 - No chest pain.  Troponin elevation likely demand ischemia in setting of acute on chronic anemia and hypoxia - Continue Lipitor and Coreg  Type 2 diabetes - Hemoglobin A1c 6.6% in November 2024 - Low intensity sliding scale insulin if needed    DVT prophylaxis: SCDs   Code Status: Full Code Family Communication:  Discussed directly with patient Disposition:  Inpatient still hospitalized for supplemental O2, anemia, IV PPI, IV ABx, will discharge to home when medically stable  Consultants:  GI Dr. Levon Hedger   Procedures:    Antimicrobials:  Anti-infectives (From admission, onward)    Start     Dose/Rate Route Frequency Ordered Stop   11/27/23 1900  cefTRIAXone (ROCEPHIN) 2 g in sodium chloride 0.9 % 100 mL IVPB        2 g 200 mL/hr over 30 Minutes Intravenous Every 24 hours 11/26/23 1938 12/01/23 1859   11/27/23 1000  oseltamivir (TAMIFLU) capsule 30 mg        30 mg Oral 2 times daily 11/26/23 1938 12/01/23 2159   11/26/23  2000  doxycycline (VIBRAMYCIN) 100 mg in sodium chloride 0.9 % 250 mL IVPB        100 mg 125 mL/hr over 120 Minutes Intravenous Every 12 hours 11/26/23 1938     11/26/23 1945  oseltamivir (TAMIFLU) capsule 75 mg        75 mg Oral  Once 11/26/23 1943 11/26/23 2245   11/26/23 1900  cefTRIAXone (ROCEPHIN) 1 g in sodium chloride 0.9 % 100 mL IVPB        1 g 200 mL/hr over 30 Minutes Intravenous  Once 11/26/23 1856 11/26/23 2038   11/26/23 1900  azithromycin (ZITHROMAX) 500 mg in sodium chloride 0.9 % 250 mL IVPB  Status:  Discontinued        500 mg 250 mL/hr over 60 Minutes Intravenous  Once 11/26/23 1856 11/26/23 1938       Data Reviewed: I have personally reviewed following labs and imaging studies CBC: Recent Labs  Lab 11/26/23 1336 11/26/23 2019 11/27/23 0139 11/27/23 0736  WBC 4.2 3.8* 3.5* 7.5  NEUTROABS 2.8  --   --   --   HGB 9.7* 9.4* 8.0* 9.2*  HCT 28.7* 28.5* 24.1* 28.1*  MCV 87.2 87.4 87.6 87.0  PLT 197 180 144* 171   Basic Metabolic Panel: Recent Labs  Lab 11/26/23 1336 11/26/23 1508 11/27/23 0139  NA 126*  --  127*  K 2.7*  --  2.4*  CL 81*  --  89*  CO2 31  --  28  GLUCOSE 145*  --  134*  BUN 16  --  14  CREATININE 1.19  --  0.97  CALCIUM 8.7*  --  7.6*  MG  --  1.7 1.8   GFR: Estimated Creatinine Clearance: 66 mL/min (by C-G formula based on SCr of 0.97 mg/dL). Liver Function Tests: Recent Labs  Lab 11/26/23 1336 11/27/23 0139  AST 63* 53*  ALT 54* 47*  ALKPHOS 67 59  BILITOT 1.2 0.9  PROT 6.8 5.7*  ALBUMIN 3.3* 2.8*   CBG: Recent Labs  Lab 11/26/23 2225 11/27/23 0027 11/27/23 0438  GLUCAP 134* 140* 139*    Recent Results (from the past 240 hours)  Resp panel by RT-PCR (RSV, Flu A&B, Covid) Anterior Nasal Swab     Status: Abnormal   Collection Time: 11/26/23 12:59 PM   Specimen: Anterior Nasal Swab  Result Value Ref Range Status   SARS Coronavirus 2 by RT PCR NEGATIVE NEGATIVE Final    Comment: (NOTE) SARS-CoV-2 target nucleic  acids are NOT DETECTED.  The SARS-CoV-2 RNA is generally detectable in upper respiratory specimens during the acute phase of infection. The lowest concentration of SARS-CoV-2 viral copies this assay can detect is 138 copies/mL. A negative result does not preclude SARS-Cov-2 infection and should not be used as the sole basis for treatment or other patient management decisions. A negative result may occur with  improper specimen collection/handling, submission of specimen other than nasopharyngeal swab, presence of viral mutation(s) within the areas targeted by this assay, and inadequate number of viral copies(<138 copies/mL). A negative result must be combined with clinical observations, patient history, and epidemiological information. The expected result is Negative.  Fact Sheet for Patients:  BloggerCourse.com  Fact Sheet for Healthcare Providers:  SeriousBroker.it  This test is no t yet approved or cleared by the [place] FDA and  has been authorized for detection and/or diagnosis of SARS-CoV-2 by FDA under an Emergency Use Authorization (EUA). This EUA will remain  in effect (meaning this test can be used) for the duration of the COVID-19 declaration under Section 564(b)(1) of the Act, 21 U.S.C.section 360bbb-3(b)(1), unless the authorization is terminated  or revoked sooner.       Influenza A by PCR POSITIVE (A) NEGATIVE Final   Influenza B by PCR NEGATIVE NEGATIVE Final    Comment: (NOTE) The Xpert Xpress SARS-CoV-2/FLU/RSV plus assay is intended as an aid in the diagnosis of influenza from Nasopharyngeal swab specimens and should not be used as a sole basis for treatment. Nasal washings and aspirates are unacceptable for Xpert Xpress SARS-CoV-2/FLU/RSV testing.  Fact Sheet for Patients: BloggerCourse.com  Fact Sheet for Healthcare  Providers: SeriousBroker.it  This test is not yet approved or cleared by the [place] FDA and has been authorized for detection and/or diagnosis of SARS-CoV-2 by FDA under an Emergency Use Authorization (EUA). This EUA will remain in effect (meaning this test can be used) for the duration of the COVID-19 declaration under Section 564(b)(1) of the Act, 21 U.S.C. section  360bbb-3(b)(1), unless the authorization is terminated or revoked.     Resp Syncytial Virus by PCR NEGATIVE NEGATIVE Final    Comment: (NOTE) Fact Sheet for Patients: BloggerCourse.com  Fact Sheet for Healthcare Providers: SeriousBroker.it  This test is not yet approved or cleared by the [place] FDA and has been authorized for detection and/or diagnosis of SARS-CoV-2 by FDA under an Emergency Use Authorization (EUA). This EUA will remain in effect (meaning this test can be used) for the duration of the COVID-19 declaration under Section 564(b)(1) of the Act, 21 U.S.C. section 360bbb-3(b)(1), unless the authorization is terminated or revoked.  Performed at [facility], [address], [place], [place] [address]      Radiology Studies: DG Chest Portable 1 View Result Date: 11/26/2023 CLINICAL DATA:  Several day history of weakness, cough, congestion EXAM: PORTABLE CHEST 1 VIEW COMPARISON:  Chest radiograph dated 09/12/2023 FINDINGS: Patient is rotated to the left. Normal lung volumes. Vascular calcifications project over the bilateral apices. Prominent bilateral interstitial opacities. Patchy left retrocardiac opacities. No pleural effusion or pneumothorax. Similar mildly enlarged cardiomediastinal silhouette. No acute osseous abnormality. IMPRESSION: 1. Prominent bilateral interstitial opacities, which may represent pulmonary edema. 2. Patchy left retrocardiac opacities, which may represent atelectasis, aspiration, or pneumonia.  Electronically Signed   By: Agustin Cree M.D.   On: 11/26/2023 18:19    Scheduled Meds:  amiodarone  200 mg Oral Daily   atorvastatin  80 mg Oral Daily   carvedilol  6.25 mg Oral BID   insulin aspart  0-6 Units Subcutaneous Q4H   oseltamivir  30 mg Oral BID   pantoprazole (PROTONIX) IV  40 mg Intravenous Q12H   [START ON 11/28/2023] pneumococcal 20-valent conjugate vaccine  0.5 mL Intramuscular Tomorrow-1000   sodium chloride flush  3 mL Intravenous Q12H   Continuous Infusions:  cefTRIAXone (ROCEPHIN)  IV     doxycycline (VIBRAMYCIN) IV 100 mg (11/27/23 0116)   potassium chloride 10 mEq (11/27/23 0628)     LOS: 1 day    Total time spent coordinating care:   Debarah Crape, DO [facility]  To contact the attending physician between 7A-7P please use Epic Chat. To contact the covering physician during after hours 7P-7A, please review Amion.   11/27/2023, 7:47 AM   *This document has been created with the assistance of dictation software. Please excuse typographical errors. *

## 2023-11-27 NOTE — TOC Initial Note (Signed)
 Transition of Care [facility]) - Initial/Assessment Note    Patient Details  Name: Connor Bryant MRN: [id number] Date of Birth: 1945/10/07  [facility]) CM/SW Contact:    Karn Cassis, LCSW Phone Number: 11/27/2023, 8:57 AM  Clinical Narrative: Pt admitted for GI bleed. Assessment completed due to high risk readmission score. Pt reports he lives alone and is fairly independent with ADLs. He ambulates with a walker at baseline. Pt is active with [facility]. Victorino Dike with [facility] aware of admission. Will need resumption orders. TOC will follow.                   Expected Discharge Plan: Home w Home Health Services Barriers to Discharge: Continued Medical Work up   Patient Goals and CMS Choice Patient states their goals for this hospitalization and ongoing recovery are:: return home   Choice offered to / list presented to : Patient [place] ownership interest in [facility].provided to::  (n/a)    Expected Discharge Plan and Services In-house Referral: Clinical Social Work     Living arrangements for the past 2 months: Single Family Home                           HH Arranged: PT HH Agency: [facility] Date HH Agency Contacted: 11/27/23 Time HH Agency Contacted: [phone] Representative spoke with at [facility] Agency: Victorino Dike  Prior Living Arrangements/Services Living arrangements for the past 2 months: Single Family Home Lives with:: Self Patient language and need for interpreter reviewed:: Yes Do you feel safe going back to the place where you live?: Yes      Need for Family Participation in Patient Care: No (Comment)   Current home services: DME (cane, walker, BSC) Criminal Activity/Legal Involvement Pertinent to Current Situation/Hospitalization: No - Comment as needed  Activities of Daily Living   ADL Screening (condition at time of admission) Independently performs ADLs?: Yes (appropriate for developmental age) Is the  patient deaf or have difficulty hearing?: No Does the patient have difficulty seeing, even when wearing glasses/contacts?: No Does the patient have difficulty concentrating, remembering, or making decisions?: No  Permission Sought/Granted                  Emotional Assessment     Affect (typically observed): Appropriate Orientation: : Oriented to Self, Oriented to Place, Oriented to  Time, Oriented to Situation Alcohol / Substance Use: Not Applicable Psych Involvement: No (comment)  Admission diagnosis:  GI bleeding [K92.2] Patient Active Problem List   Diagnosis Date Noted   GI bleeding 11/26/2023   Hyponatremia 11/26/2023   Hypokalemia 11/26/2023   Influenza A 11/26/2023   S/P coronary artery stent placement 09/25/2023   Nonrheumatic aortic valve stenosis 09/25/2023   Coronary artery disease involving native coronary artery of native heart without angina pectoris 09/15/2023   Acute on chronic systolic heart failure (HCC) 09/14/2023   CHF (congestive heart failure) (HCC) 09/11/2023   HFrEF (heart failure with reduced ejection fraction) (HCC) 08/21/2023   Acute on chronic anemia 08/18/2023   Symptomatic anemia 08/18/2023   On apixaban therapy 08/18/2023   Paroxysmal atrial fibrillation (HCC) 08/18/2023   GI bleed 08/17/2023   GIB (gastrointestinal bleeding) 08/17/2023   AKI (acute kidney injury) (HCC) 08/17/2023   Respiratory distress 08/17/2023   Rheumatoid arthritis involving multiple sites with positive rheumatoid factor (HCC) 06/25/2023   Diabetes mellitus treated with oral medication (HCC) 06/03/2023   Non-ST elevation (  NSTEMI) myocardial infarction [facility])    Acute systolic heart failure (HCC)    Elevated troponin 05/03/2020   Fever 05/03/2020   Positive D dimer 05/03/2020   PAF (paroxysmal atrial fibrillation) (HCC) 05/02/2020   S/P insertion of iliac artery stent 05/01/2020   Diabetic lipidosis (HCC) 03/10/2020   Intermittent claudication (HCC) 03/10/2020    Compression fracture of T12 vertebra (HCC) 10/27/2018   Spinal stenosis of lumbosacral region 10/27/2018   Small bowel obstruction (HCC) 09/30/2018   Back pain of lumbar region with sciatica 08/26/2018   Chronic left-sided low back pain with left-sided sciatica 08/26/2018   Tremor 08/26/2018   Pure hypercholesterolemia 02/25/2018   Atherosclerosis of aorta (HCC) 02/24/2018   Bilateral hip pain 06/24/2017   Body mass index 30.0-30.9, adult 06/06/2016   Chronic gout with tophus 06/06/2016   Essential hypertension 06/06/2016   Type 2 diabetes mellitus without complication, without long-term current use of insulin (HCC) 06/06/2016   Hypertension 06/06/2016   Chronic mesenteric ischemia (HCC) 03/01/2014   Peripheral vascular disease (HCC) 02/16/2014   Superior mesenteric artery stenosis (HCC) 02/16/2014   History of colonic polyps 11/24/2013   PCP:  Mechele Claude, MD Pharmacy:   [facility] [place], [place] - [address] [place] [address] Phone: [phone] Fax: [phone]  [id number] - [place], [place] - [address] [place] [address] Phone: [phone] Fax: [phone]  [facility] [address] [place] [address] Phone: [phone] Fax: [phone]     Social Drivers of Health (SDOH) Social History: SDOH Screenings   Food Insecurity: No Food Insecurity (11/26/2023)  Housing: Low Risk  (11/26/2023)  Transportation Needs: No Transportation Needs (11/26/2023)  Utilities: Not At Risk (11/26/2023)  Alcohol Screen: Low Risk  (08/19/2023)  Depression (PHQ2-9): Low Risk  (10/21/2023)  Recent Concern: Depression (PHQ2-9) - Medium Risk (10/15/2023)  Financial Resource Strain: Low Risk  (08/19/2023)  Physical Activity: Insufficiently Active (12/03/2022)  Social Connections: Moderately Isolated (09/24/2023)  Stress: No Stress Concern Present (12/03/2022)  Tobacco  Use: Medium Risk (11/26/2023)   SDOH Interventions:     Readmission Risk Interventions    11/27/2023    8:43 AM  Readmission Risk Prevention Plan  Transportation Screening Complete  HRI or Home Care Consult Complete  Social Work Consult for Recovery Care Planning/Counseling Complete  Palliative Care Screening Not Applicable  Medication Review Oceanographer) Complete

## 2023-11-28 DIAGNOSIS — J9601 Acute respiratory failure with hypoxia: Secondary | ICD-10-CM

## 2023-11-28 DIAGNOSIS — K922 Gastrointestinal hemorrhage, unspecified: Secondary | ICD-10-CM | POA: Diagnosis not present

## 2023-11-28 LAB — BASIC METABOLIC PANEL
Anion gap: 8 (ref 5–15)
BUN: 12 mg/dL (ref 8–23)
CO2: 27 mmol/L (ref 22–32)
Calcium: 8 mg/dL — ABNORMAL LOW (ref 8.9–10.3)
Chloride: 96 mmol/L — ABNORMAL LOW (ref 98–111)
Creatinine, Ser: 0.9 mg/dL (ref 0.61–1.24)
GFR, Estimated: >60 mL/min (ref >60)
Glucose, Bld: 187 mg/dL — ABNORMAL HIGH (ref 70–99)
Potassium: 3.8 mmol/L (ref 3.5–5.1)
Sodium: 131 mmol/L — ABNORMAL LOW (ref 135–145)

## 2023-11-28 LAB — COMPREHENSIVE METABOLIC PANEL
ALT: 53 U/L — ABNORMAL HIGH (ref 0–44)
AST: 53 U/L — ABNORMAL HIGH (ref 15–41)
Albumin: 2.6 g/dL — ABNORMAL LOW (ref 3.5–5.0)
Alkaline Phosphatase: 57 U/L (ref 38–126)
Anion gap: 4 — ABNORMAL LOW (ref 5–15)
BUN: 10 mg/dL (ref 8–23)
CO2: 27 mmol/L (ref 22–32)
Calcium: 7.5 mg/dL — ABNORMAL LOW (ref 8.9–10.3)
Chloride: 97 mmol/L — ABNORMAL LOW (ref 98–111)
Creatinine, Ser: 0.9 mg/dL (ref 0.61–1.24)
GFR, Estimated: >60 mL/min (ref >60)
Glucose, Bld: 127 mg/dL — ABNORMAL HIGH (ref 70–99)
Potassium: 2.6 mmol/L — CL (ref 3.5–5.1)
Sodium: 128 mmol/L — ABNORMAL LOW (ref 135–145)
Total Bilirubin: 0.6 mg/dL (ref 0.0–1.2)
Total Protein: 5.4 g/dL — ABNORMAL LOW (ref 6.5–8.1)

## 2023-11-28 LAB — CBC WITH DIFFERENTIAL/PLATELET
Abs Immature Granulocytes: 0 10*3/uL (ref 0.00–0.07)
Band Neutrophils: 2 %
Basophils Absolute: 0 10*3/uL (ref 0.0–0.1)
Basophils Relative: 0 %
Eosinophils Absolute: 0 10*3/uL (ref 0.0–0.5)
Eosinophils Relative: 0 %
HCT: 25 % — ABNORMAL LOW (ref 39.0–52.0)
Hemoglobin: 8.1 g/dL — ABNORMAL LOW (ref 13.0–17.0)
Lymphocytes Relative: 16 %
Lymphs Abs: 0.7 10*3/uL (ref 0.7–4.0)
MCH: 28.8 pg (ref 26.0–34.0)
MCHC: 32.4 g/dL (ref 30.0–36.0)
MCV: 89 fL (ref 80.0–100.0)
Monocytes Absolute: 0.2 10*3/uL (ref 0.1–1.0)
Monocytes Relative: 5 %
Neutro Abs: 3.4 10*3/uL (ref 1.7–7.7)
Neutrophils Relative %: 77 %
Platelets: 146 10*3/uL — ABNORMAL LOW (ref 150–400)
RBC: 2.81 MIL/uL — ABNORMAL LOW (ref 4.22–5.81)
RDW: 15.7 % — ABNORMAL HIGH (ref 11.5–15.5)
Smear Review: DECREASED
WBC: 4.3 10*3/uL (ref 4.0–10.5)
nRBC: 0 % (ref 0.0–0.2)

## 2023-11-28 LAB — GLUCOSE, CAPILLARY
Glucose-Capillary: 137 mg/dL — ABNORMAL HIGH (ref 70–99)
Glucose-Capillary: 112 mg/dL — ABNORMAL HIGH (ref 70–99)
Glucose-Capillary: 192 mg/dL — ABNORMAL HIGH (ref 70–99)
Glucose-Capillary: 221 mg/dL — ABNORMAL HIGH (ref 70–99)
Glucose-Capillary: 196 mg/dL — ABNORMAL HIGH (ref 70–99)

## 2023-11-28 LAB — MAGNESIUM: Magnesium: 1.8 mg/dL (ref 1.7–2.4)

## 2023-11-28 LAB — PHOSPHORUS: Phosphorus: 2.2 mg/dL — ABNORMAL LOW (ref 2.5–4.6)

## 2023-11-28 LAB — LEGIONELLA PNEUMOPHILA SEROGP 1 UR AG: L. pneumophila Serogp 1 Ur Ag: NEGATIVE

## 2023-11-28 MED ORDER — LEVALBUTEROL HCL 1.25 MG/0.5ML IN NEBU
1.2500 mg | INHALATION_SOLUTION | Freq: Three times a day (TID) | RESPIRATORY_TRACT | Status: DC
  Administered 2023-11-28: 1.25 mg via RESPIRATORY_TRACT
  Filled 2023-11-28: qty 0.5

## 2023-11-28 MED ORDER — LEVALBUTEROL HCL 1.25 MG/0.5ML IN NEBU
1.2500 mg | INHALATION_SOLUTION | Freq: Four times a day (QID) | RESPIRATORY_TRACT | Status: DC
  Administered 2023-11-28 – 2023-11-29 (×6): 1.25 mg via RESPIRATORY_TRACT
  Filled 2023-11-28 (×6): qty 0.5

## 2023-11-28 MED ORDER — IPRATROPIUM BROMIDE 0.02 % IN SOLN
0.5000 mg | Freq: Four times a day (QID) | RESPIRATORY_TRACT | Status: DC
  Administered 2023-11-28 – 2023-11-29 (×6): 0.5 mg via RESPIRATORY_TRACT
  Filled 2023-11-28 (×6): qty 2.5

## 2023-11-28 MED ORDER — METHYLPREDNISOLONE SODIUM SUCC 40 MG IJ SOLR
40.0000 mg | Freq: Two times a day (BID) | INTRAMUSCULAR | Status: DC
  Administered 2023-11-28 – 2023-11-29 (×4): 40 mg via INTRAVENOUS
  Filled 2023-11-28 (×4): qty 1

## 2023-11-28 MED ORDER — HYDRALAZINE HCL 20 MG/ML IJ SOLN
10.0000 mg | INTRAMUSCULAR | Status: DC | PRN
  Administered 2023-11-28 – 2023-12-01 (×7): 10 mg via INTRAVENOUS
  Filled 2023-11-28 (×6): qty 1

## 2023-11-28 MED ORDER — AMLODIPINE BESYLATE 5 MG PO TABS
10.0000 mg | ORAL_TABLET | Freq: Every day | ORAL | Status: DC
  Administered 2023-11-28 – 2023-11-29 (×2): 10 mg via ORAL
  Filled 2023-11-28 (×2): qty 2

## 2023-11-28 MED ORDER — POTASSIUM CHLORIDE 10 MEQ/100ML IV SOLN
10.0000 meq | INTRAVENOUS | Status: AC
  Administered 2023-11-28 (×3): 10 meq via INTRAVENOUS
  Filled 2023-11-28 (×3): qty 100

## 2023-11-28 MED ORDER — BUDESONIDE 0.25 MG/2ML IN SUSP
0.2500 mg | Freq: Two times a day (BID) | RESPIRATORY_TRACT | Status: DC
Start: 2023-11-28 — End: 2023-12-02
  Administered 2023-11-28 – 2023-12-02 (×9): 0.25 mg via RESPIRATORY_TRACT
  Filled 2023-11-28 (×9): qty 2

## 2023-11-28 MED ORDER — ATORVASTATIN CALCIUM 40 MG PO TABS
80.0000 mg | ORAL_TABLET | Freq: Every day | ORAL | Status: DC
  Administered 2023-11-29 – 2023-12-02 (×4): 80 mg via ORAL
  Filled 2023-11-28 (×4): qty 2

## 2023-11-28 MED ORDER — RISAQUAD PO CAPS
2.0000 | ORAL_CAPSULE | Freq: Three times a day (TID) | ORAL | Status: DC
Start: 2023-11-28 — End: 2023-12-02
  Administered 2023-11-28 – 2023-12-02 (×13): 2 via ORAL
  Filled 2023-11-28 (×13): qty 2

## 2023-11-28 MED ORDER — GUAIFENESIN 100 MG/5ML PO LIQD
10.0000 mL | Freq: Three times a day (TID) | ORAL | Status: DC
  Administered 2023-11-28 – 2023-12-02 (×13): 10 mL via ORAL
  Filled 2023-11-28 (×13): qty 10

## 2023-11-28 MED ORDER — MAGNESIUM SULFATE 2 GM/50ML IV SOLN
2.0000 g | Freq: Once | INTRAVENOUS | Status: AC
  Administered 2023-11-28: 2 g via INTRAVENOUS
  Filled 2023-11-28: qty 50

## 2023-11-28 MED ORDER — IPRATROPIUM BROMIDE 0.02 % IN SOLN
0.5000 mg | Freq: Three times a day (TID) | RESPIRATORY_TRACT | Status: DC
  Administered 2023-11-28: 0.5 mg via RESPIRATORY_TRACT
  Filled 2023-11-28: qty 2.5

## 2023-11-28 MED ORDER — AMIODARONE HCL 200 MG PO TABS
100.0000 mg | ORAL_TABLET | Freq: Every day | ORAL | Status: DC
  Administered 2023-11-30: 100 mg via ORAL
  Filled 2023-11-28: qty 1

## 2023-11-28 MED ORDER — POTASSIUM CHLORIDE CRYS ER 20 MEQ PO TBCR
40.0000 meq | EXTENDED_RELEASE_TABLET | Freq: Once | ORAL | Status: AC
  Administered 2023-11-28: 40 meq via ORAL
  Filled 2023-11-28: qty 2

## 2023-11-28 MED ORDER — SODIUM CHLORIDE 1 G PO TABS
1.0000 g | ORAL_TABLET | Freq: Two times a day (BID) | ORAL | Status: DC
  Administered 2023-11-28 – 2023-11-29 (×3): 1 g via ORAL
  Filled 2023-11-28 (×3): qty 1

## 2023-11-28 MED ORDER — SODIUM CHLORIDE 0.9 % IV SOLN: INTRAVENOUS | Status: DC

## 2023-11-28 NOTE — Progress Notes (Signed)
 Date and time results received: 11/28/23 @ 0628  Test: potassium Critical Value: 2.6  Name of Provider Notified: Dr. Thomes Dinning 03/01-2024 @ 06:30am  Orders Received? Or Actions Taken?: See new orders

## 2023-11-28 NOTE — Plan of Care (Signed)

## 2023-11-28 NOTE — Plan of Care (Signed)
  Problem: Education: Goal: Ability to describe self-care measures that may prevent or decrease complications (Diabetes Survival Skills Education) will improve Outcome: Progressing Goal: Individualized Educational Video(s) Outcome: Progressing   Problem: Coping: Goal: Ability to adjust to condition or change in health will improve Outcome: Progressing   Problem: Fluid Volume: Goal: Ability to maintain a balanced intake and output will improve Outcome: Progressing   Problem: Health Behavior/Discharge Planning: Goal: Ability to identify and utilize available resources and services will improve Outcome: Progressing Goal: Ability to manage health-related needs will improve Outcome: Progressing   Problem: Metabolic: Goal: Ability to maintain appropriate glucose levels will improve Outcome: Progressing   Problem: Nutritional: Goal: Maintenance of adequate nutrition will improve Outcome: Progressing Goal: Progress toward achieving an optimal weight will improve Outcome: Progressing   Problem: Skin Integrity: Goal: Risk for impaired skin integrity will decrease Outcome: Progressing   Problem: Tissue Perfusion: Goal: Adequacy of tissue perfusion will improve Outcome: Progressing   Problem: Education: Goal: Knowledge of General Education information will improve Description: Including pain rating scale, medication(s)/side effects and non-pharmacologic comfort measures Outcome: Progressing   Problem: Health Behavior/Discharge Planning: Goal: Ability to manage health-related needs will improve Outcome: Progressing

## 2023-11-28 NOTE — Progress Notes (Signed)
 Review of chart: patient not seen today.  Per attending's notes this morning, he has had acute worsening respiratory failure unable to tolerate BIPAP. On 6L of oxygen with 98% O2 sat. Influenza PNA.   Hgb has remained stable in the 8 range over the past 24-36 hours. No needs for transfusion. Last recorded stool 3/4, black.  We will continue to follow along, consider EGD once stable from respiratory standpoint prior to discharge or allow him to follow up with [facility] at discharge, depending on clinical course.  Leanna Battles. Dixon Boos [facility] [phone] 3/6/202512:24 PM

## 2023-11-28 NOTE — Hospital Course (Addendum)
 Connor Bryant is 78 y.o. male with diabetes, hypertension, hyperlipidemia, CAD, chronic heart failure with reduced EF, paroxysmal A-fib on Eliquis, presents with generalized weakness, cough, shortness of breath.  He denies any blood in his stool but does endorse that it has been darker than normal.  He has also recently had bleeding from his nose and mouth and discontinued his Plavix for the last month.  In the   ED patient was found to be saturating the low 90s on room air, EKG reportedly revealed sinus bradycardia with first-degree AV block, PACs, incomplete RBBB and LAFB.   Chest x-ray revealed prominent bilateral interstitial opacities with retrocardiac opacity.   Labs reveal influenza A positive, positive FOBT, sodium 126, potassium 2.7, hemoglobin 9.7, troponin 161.   Gastroenterology Dr. Levon Hedger was consulted by EDP.  Patient was initiated on antibiotics, IV Protonix, IV potassium, IV fluids.     Assessment & Plan:   Principal Problem:   GI bleeding Active Problems:   Type 2 diabetes mellitus without complication, without long-term current use of insulin (HCC)   Elevated troponin   Rheumatoid arthritis involving multiple sites with positive rheumatoid factor (HCC)   Symptomatic anemia   Paroxysmal atrial fibrillation (HCC)   HFrEF (heart failure with reduced ejection fraction) (HCC)   Coronary artery disease involving native coronary artery of native heart without angina pectoris   Hyponatremia   Hypokalemia   Influenza A   Melena   Acute hypoxic respiratory failure -Much improved, has been weaned off all supplemental oxygen, currently satting 90% on room air Acute worsening this AM--off BiPAP, could not tolerate 6 L of oxygen, satting 98%>>> Down to 2 L, >>> to room air satting 98% - Secondary to influenza pneumonia  - Reviewed chest x-ray-edema possible pneumonia  -Will continue with breathing treatment with Atrovent/Xopenex,  IV steroids -tapering down   Influenza A-  Pneumonia - Influenza A PCR positive - Continue empiric antibiotics for now. On Doxy and Ceft.>>  Patient to p.o. Levaquin Completed course of antibiotics - Strep pneumo and Legionella antigens -were negative - Continue Tamiflu -completed treatment    GI bleed, melena, FOBT +, w Acute on chronic anemia Hemoglobin 9.4>>  8.4 >> 8.2  - Takes Eliquis and Plavix at home.   -Patient stopped taking his Plavix on his own ... Due to bleeding, nosebleeding Adamant that he will not resume Plavix despite cardiology recommendation   Stopped Plavix 1 month ago due to epistaxis. Hold Eliquis for now as well.    - GI consulted:  - Continue with PPI for now - 11/30/2023, planning for EGD -no active site of bleeding, okay to resume anticoagulant  Hyponatremia - Sodium 126 on arrival >>128 >>>131, 133  - Status post 1 L NS in ED Will continue to monitor   Hypokalemia - Continue IV replacement and PO (sodium 2.6 >>3.4, 3.7, 3.6 ) -Monitoring potassium and magnesium level and repleting accordingly - Continue to trending    Chronic heart failure with reduced EF -Much improved lower extremity edema, much improved shortness of breath -Discontinued IV Lasix, switch back to p.o. torsemide- 3/92005  - Echo as of 08/2023: LVEF 30 to 35%, mild left ventricular hypertrophy, global hypokinesis, left atrial dilation, right atrial dilation, mild MVR, moderate AVS. -With history of PCI and stenting - Continue carvedilol for now - Continue to monitor volume status closely.  BNP 858 >>> 2033.0  -Start IV Lasix 40 mg daily>> switch to p.o. torsemide  -Cardiology was consulted at [facility], recommended to  continue Plavix, patient is refusing due to multiple episodes of nosebleed--okay to restart Eliquis  -Due to progressive respiratory distress, lung issues, patient is amiodarone was switched to diltiazem    Paroxysmal Afib  - Holding Eliquis as above - Continue amiodarone and Coreg - (dosing dose of amiodarone  -due to respiratory distress to reduce chance of toxicity)    CAD - Elevated Troponin - History of DES to LAD in December 2024 -- Trop 161 --> 148 - No chest pain.  Troponin elevation likely demand ischemia in setting of acute on chronic anemia and hypoxia - Continue Lipitor and Coreg -Amiodarone switched to diltiazem   Type 2 diabetes - Hemoglobin A1c 6.6% in November 2024 - Low intensity sliding scale insulin if needed    Hypertension -BP, initiating as needed IV hydralazine, Home medication of Coreg increased dose, -Discontinue Norvasc switch to diltiazem 12/01/23

## 2023-11-28 NOTE — Progress Notes (Signed)
 PROGRESS NOTE    Patient: Connor Bryant                            PCP: Mechele Claude, MD                    DOB: 02/08/1946            DOA: 11/26/2023 [id number]             DOS: 11/28/2023, 11:30 AM   LOS: 2 days   Date of Service: The patient was seen and examined on 11/28/2023  Subjective:   The patient was seen and examined this morning, laying in bed, with mild-to moderate distress with shortness of breath, refusing BiPAP-sitting very uncomfortable unable to sleep overnight -On 6 L of oxygen, satting 98% Denied of having any gas or bowel movement overnight no active rectal bleed Mild drop in hemoglobin to 8.1, potassium 2.6 this a.m.  Brief Narrative:   Connor Bryant is 78 y.o. male with diabetes, hypertension, hyperlipidemia, CAD, chronic heart failure with reduced EF, paroxysmal A-fib on Eliquis, presents with generalized weakness, cough, shortness of breath.  He denies any blood in his stool but does endorse that it has been darker than normal.  He has also recently had bleeding from his nose and mouth and discontinued his Plavix for the last month.  In the   ED patient was found to be saturating the low 90s on room air, EKG reportedly revealed sinus bradycardia with first-degree AV block, PACs, incomplete RBBB and LAFB.   Chest x-ray revealed prominent bilateral interstitial opacities with retrocardiac opacity.   Labs reveal influenza A positive, positive FOBT, sodium 126, potassium 2.7, hemoglobin 9.7, troponin 161.   Gastroenterology Dr. Levon Hedger was consulted by EDP.  Patient was initiated on antibiotics, IV Protonix, IV potassium, IV fluids.       Assessment & Plan:   Principal Problem:   GI bleeding Active Problems:   Type 2 diabetes mellitus without complication, without long-term current use of insulin (HCC)   Elevated troponin   Rheumatoid arthritis involving multiple sites with positive rheumatoid factor (HCC)   Symptomatic anemia   Paroxysmal atrial  fibrillation (HCC)   HFrEF (heart failure with reduced ejection fraction) (HCC)   Coronary artery disease involving native coronary artery of native heart without angina pectoris   Hyponatremia   Hypokalemia   Influenza A   Melena        Acute hypoxic respiratory failure Acute worsening this AM--off BiPAP, could not tolerate 6 L of oxygen, satting 98% - Secondary to influenza pneumonia  - Reviewed chest x-ray-edema possible pneumonia - Continue to monitor in ICU setting -Continue respiratory support, nebs, flow oxygen by nasal cannula -Breathing treatment with Atrovent/Xopenex, initiating IV steroids   Influenza A- Pneumonia - Influenza A PCR positive - Continue empiric antibiotics for now. On Doxy and ceft. - Strep pneumo and Legionella antigens pending - Continue Tamiflu, - Droplet precautions - Continue supportive care   GI bleed, melena, FOBT + Acute on chronic anemia - Takes Eliquis and Plavix at home.   Stopped Plavix 1 month ago due to epistaxis. Hold Eliquis for now as well. - Hemoglobin downtrending baseline 11.4, down to 9.4 on arrival.  Appears to be stable at this time - GI consulted: Continue to follow closely, may need endoscopy once patient is more stable - Continue with PPI for now   Hyponatremia - Sodium 126  on arrival >>128  - Status post 1 L NS in ED - Urine sodium, urine osmole's: - Hold diuretics for now - Monitoring closely-trending    Hypokalemia - Continue IV replacement and PO (sodium 2.6 today 11/28/23) -Checking magnesium level repleting accordingly - Continue to trending    Chronic heart failure with reduced EF -Resp distress, - Echo as of 08/2023: LVEF 30 to 35%, mild left ventricular hypertrophy, global hypokinesis, left atrial dilation, right atrial dilation, mild MVR, moderate AVS. - Continue carvedilol for now - Continue to monitor volume status closely.  BNP 858. Currently euvolemic but did receive some fluids in the ED - Chest  x-ray as above - Hold diuretics for now    Paroxysmal Afib  - Holding Eliquis as above - Continue amiodarone and Coreg - (same dose of amiodarone -due to respiratory distress to reduce chance of toxicity)    CAD - Elevated Troponin - History of DES to LAD in December 2024 -- Trop 161 --> 148 - No chest pain.  Troponin elevation likely demand ischemia in setting of acute on chronic anemia and hypoxia - Continue Lipitor and Coreg   Type 2 diabetes - Hemoglobin A1c 6.6% in November 2024 - Low intensity sliding scale insulin if needed    Hypertension -BP, initiating as needed IV hydralazine, Home medication of Coreg, will add Norvasc 11/28/23   ----------------------------------------------------------------------------------------------------------------------------------------------- Nutritional status:  The patient's BMI is: Body mass index is 27.98 kg/m. I agree with the assessment and plan as outlined --------------------------------------------------------------------------------------------------------------------------------------- Cultures; Blood Cultures x 2 >> NGT Urine Culture  >>> NGT  Sputum Culture >> NGT    ------------------------------------------------------------------------------------------------------------------------------------------------  DVT prophylaxis:  SCDs Start: 11/26/23 1936   Code Status:   Code Status: Full Code  Family Communication: No family member present at bedside-  -Advance care planning has been discussed.   Admission status:   Status is: Inpatient Remains inpatient appropriate because: Needing respiratory support, BiPAP in ICU setting, IV antibiotics -monitoring for GI bleed   Disposition: From  - home             Planning for discharge in 2 days: to Home w [facility]    Procedures:   No admission procedures for hospital encounter.   Antimicrobials:  Anti-infectives (From admission, onward)    Start     Dose/Rate Route  Frequency Ordered Stop   11/27/23 2200  oseltamivir (TAMIFLU) capsule 75 mg        75 mg Oral 2 times daily 11/27/23 1333 12/01/23 2159   11/27/23 1900  cefTRIAXone (ROCEPHIN) 2 g in sodium chloride 0.9 % 100 mL IVPB        2 g 200 mL/hr over 30 Minutes Intravenous Every 24 hours 11/26/23 1938 12/01/23 1859   11/27/23 1000  oseltamivir (TAMIFLU) capsule 30 mg  Status:  Discontinued        30 mg Oral 2 times daily 11/26/23 1938 11/27/23 1333   11/26/23 2000  doxycycline (VIBRAMYCIN) 100 mg in sodium chloride 0.9 % 250 mL IVPB        100 mg 125 mL/hr over 120 Minutes Intravenous Every 12 hours 11/26/23 1938     11/26/23 1945  oseltamivir (TAMIFLU) capsule 75 mg        75 mg Oral  Once 11/26/23 1943 11/26/23 2245   11/26/23 1900  cefTRIAXone (ROCEPHIN) 1 g in sodium chloride 0.9 % 100 mL IVPB        1 g 200 mL/hr over 30 Minutes Intravenous  Once 11/26/23  1856 11/26/23 2038   11/26/23 1900  azithromycin (ZITHROMAX) 500 mg in sodium chloride 0.9 % 250 mL IVPB  Status:  Discontinued        500 mg 250 mL/hr over 60 Minutes Intravenous  Once 11/26/23 1856 11/26/23 1938        Medication:   acidophilus  2 capsule Oral TID   [START ON 11/30/2023] amiodarone  100 mg Oral Daily   [START ON 11/29/2023] atorvastatin  80 mg Oral Daily   budesonide (PULMICORT) nebulizer solution  0.25 mg Nebulization BID   carvedilol  6.25 mg Oral BID   Chlorhexidine Gluconate Cloth  6 each Topical Daily   guaiFENesin  10 mL Oral Q8H   insulin aspart  0-6 Units Subcutaneous Q4H   ipratropium  0.5 mg Nebulization Q8H   levalbuterol  1.25 mg Nebulization Q8H   methylPREDNISolone (SOLU-MEDROL) injection  40 mg Intravenous Q12H   oseltamivir  75 mg Oral BID   pantoprazole (PROTONIX) IV  40 mg Intravenous Q12H   sodium chloride flush  3 mL Intravenous Q12H    acetaminophen **OR** acetaminophen, hydrALAZINE, labetalol, oxyCODONE   Objective:   Vitals:   11/28/23 0749 11/28/23 0800 11/28/23 0900 11/28/23 1041   BP:  (!) 162/71 (!) 156/53 (!) 181/64  Pulse:  67 66   Resp:  20 (!) 21   Temp: 98 F (36.7 C)     TempSrc: Axillary     SpO2:  97% 98%   Weight:      Height:        Intake/Output Summary (Last 24 hours) at 11/28/2023 1130 Last data filed at 11/28/2023 0849 Gross per 24 hour  Intake 853 ml  Output 1000 ml  Net -147 ml   Filed Weights   11/26/23 1259  Weight: 83.5 kg     Physical examination:   Constitution:  Alert, cooperative, mild-moderate distress, or shortness of breath, refusing BiPAP Psychiatric:   Normal and stable mood and affect, cognition intact,   HEENT:        Normocephalic, PERRL, otherwise with in Normal limits  Chest:         Chest symmetric Cardio vascular:  S1/S2, RRR, No murmure, No Rubs or Gallops  pulmonary: Diffuse rhonchi, mild wheezing, minimal lower lobe crackles, positive breath sounds Mild-moderate use of accessory muscles with labored breathing-  Abdomen: Soft, non-tender, non-distended, bowel sounds,no masses, no organomegaly Muscular skeletal: Limited exam - in bed, able to move all 4 extremities,   Neuro: CNII-XII intact. , normal motor and sensation, reflexes intact  Extremities: No pitting edema lower extremities, +2 pulses  Skin: Dry, warm to touch, negative for any Rashes, No open wounds Wounds: per nursing documentation   ------------------------------------------------------------------------------------------------------------------------------------------    LABs:     Latest Ref Rng & Units 11/28/2023    4:45 AM 11/27/2023    8:43 PM 11/27/2023    2:29 PM  CBC  WBC 4.0 - 10.5 K/uL 4.3  6.2  5.4   Hemoglobin 13.0 - 17.0 g/dL 8.1  8.7  8.2   Hematocrit 39.0 - 52.0 % 25.0  27.0  25.2   Platelets 150 - 400 K/uL 146  153  152       Latest Ref Rng & Units 11/28/2023    4:45 AM 11/27/2023   12:12 PM 11/27/2023    1:39 AM  CMP  Glucose 70 - 99 mg/dL 528  413  244   BUN 8 - 23 mg/dL 10  13  14  Creatinine 0.61 - 1.24 mg/dL 1.61  0.96   0.45   Sodium 135 - 145 mmol/L 128  128  127   Potassium 3.5 - 5.1 mmol/L 2.6  3.0  2.4   Chloride 98 - 111 mmol/L 97  91  89   CO2 22 - 32 mmol/L 27  27  28    Calcium 8.9 - 10.3 mg/dL 7.5  7.7  7.6   Total Protein 6.5 - 8.1 g/dL 5.4   5.7   Total Bilirubin 0.0 - 1.2 mg/dL 0.6   0.9   Alkaline Phos 38 - 126 U/L 57   59   AST 15 - 41 U/L 53   53   ALT 0 - 44 U/L 53   47        Micro Results Recent Results (from the past 240 hours)  Resp panel by RT-PCR (RSV, Flu A&B, Covid) Anterior Nasal Swab     Status: Abnormal   Collection Time: 11/26/23 12:59 PM   Specimen: Anterior Nasal Swab  Result Value Ref Range Status   SARS Coronavirus 2 by RT PCR NEGATIVE NEGATIVE Final    Comment: (NOTE) SARS-CoV-2 target nucleic acids are NOT DETECTED.  The SARS-CoV-2 RNA is generally detectable in upper respiratory specimens during the acute phase of infection. The lowest concentration of SARS-CoV-2 viral copies this assay can detect is 138 copies/mL. A negative result does not preclude SARS-Cov-2 infection and should not be used as the sole basis for treatment or other patient management decisions. A negative result may occur with  improper specimen collection/handling, submission of specimen other than nasopharyngeal swab, presence of viral mutation(s) within the areas targeted by this assay, and inadequate number of viral copies(<138 copies/mL). A negative result must be combined with clinical observations, patient history, and epidemiological information. The expected result is Negative.  Fact Sheet for Patients:  BloggerCourse.com  Fact Sheet for Healthcare Providers:  SeriousBroker.it  This test is no t yet approved or cleared by the [place] FDA and  has been authorized for detection and/or diagnosis of SARS-CoV-2 by FDA under an Emergency Use Authorization (EUA). This EUA will remain  in effect (meaning this test can be used)  for the duration of the COVID-19 declaration under Section 564(b)(1) of the Act, 21 U.S.C.section 360bbb-3(b)(1), unless the authorization is terminated  or revoked sooner.       Influenza A by PCR POSITIVE (A) NEGATIVE Final   Influenza B by PCR NEGATIVE NEGATIVE Final    Comment: (NOTE) The Xpert Xpress SARS-CoV-2/FLU/RSV plus assay is intended as an aid in the diagnosis of influenza from Nasopharyngeal swab specimens and should not be used as a sole basis for treatment. Nasal washings and aspirates are unacceptable for Xpert Xpress SARS-CoV-2/FLU/RSV testing.  Fact Sheet for Patients: BloggerCourse.com  Fact Sheet for Healthcare Providers: SeriousBroker.it  This test is not yet approved or cleared by the [place] FDA and has been authorized for detection and/or diagnosis of SARS-CoV-2 by FDA under an Emergency Use Authorization (EUA). This EUA will remain in effect (meaning this test can be used) for the duration of the COVID-19 declaration under Section 564(b)(1) of the Act, 21 U.S.C. section 360bbb-3(b)(1), unless the authorization is terminated or revoked.     Resp Syncytial Virus by PCR NEGATIVE NEGATIVE Final    Comment: (NOTE) Fact Sheet for Patients: BloggerCourse.com  Fact Sheet for Healthcare Providers: SeriousBroker.it  This test is not yet approved or cleared by the [place] and has been authorized  for detection and/or diagnosis of SARS-CoV-2 by FDA under an Emergency Use Authorization (EUA). This EUA will remain in effect (meaning this test can be used) for the duration of the COVID-19 declaration under Section 564(b)(1) of the Act, 21 U.S.C. section 360bbb-3(b)(1), unless the authorization is terminated or revoked.  Performed at [facility], [address] [place], [place] [address]   MRSA Next Gen by PCR, Nasal     Status: None    Collection Time: 11/27/23  8:00 AM   Specimen: Nasal Mucosa; Nasal Swab  Result Value Ref Range Status   MRSA by PCR Next Gen NOT DETECTED NOT DETECTED Final    Comment: (NOTE) The GeneXpert MRSA Assay (FDA approved for NASAL specimens only), is one component of a comprehensive MRSA colonization surveillance program. It is not intended to diagnose MRSA infection nor to guide or monitor treatment for MRSA infections. Test performance is not FDA approved in patients less than 21 years old. Performed at [facility], [address], [place], [place] [address]     Radiology Reports No results found.  SIGNED: Kendell Bane, MD, FHM. FAAFP. [facility] - [facility] Critical care time spent - 55 min.  In seeing, evaluating and examining the patient. Reviewing medical records, labs, drawn plan of care. [facility],  Pager (please use [email or web address] to page/ text) Please use Epic Secure Chat for non-urgent communication (7AM-7PM)  If 7PM-7AM, please contact night-coverage [email or web address], 11/28/2023, 11:30 AM

## 2023-11-29 ENCOUNTER — Inpatient Hospital Stay (HOSPITAL_COMMUNITY)

## 2023-11-29 DIAGNOSIS — J9601 Acute respiratory failure with hypoxia: Secondary | ICD-10-CM | POA: Diagnosis not present

## 2023-11-29 DIAGNOSIS — D649 Anemia, unspecified: Secondary | ICD-10-CM | POA: Diagnosis not present

## 2023-11-29 DIAGNOSIS — K921 Melena: Secondary | ICD-10-CM | POA: Diagnosis not present

## 2023-11-29 DIAGNOSIS — K922 Gastrointestinal hemorrhage, unspecified: Secondary | ICD-10-CM | POA: Diagnosis not present

## 2023-11-29 LAB — GLUCOSE, CAPILLARY
Glucose-Capillary: 162 mg/dL — ABNORMAL HIGH (ref 70–99)
Glucose-Capillary: 194 mg/dL — ABNORMAL HIGH (ref 70–99)
Glucose-Capillary: 194 mg/dL — ABNORMAL HIGH (ref 70–99)
Glucose-Capillary: 215 mg/dL — ABNORMAL HIGH (ref 70–99)
Glucose-Capillary: 218 mg/dL — ABNORMAL HIGH (ref 70–99)
Glucose-Capillary: 272 mg/dL — ABNORMAL HIGH (ref 70–99)
Glucose-Capillary: 298 mg/dL — ABNORMAL HIGH (ref 70–99)

## 2023-11-29 LAB — COMPREHENSIVE METABOLIC PANEL
ALT: 51 U/L — ABNORMAL HIGH (ref 0–44)
AST: 39 U/L (ref 15–41)
Albumin: 2.7 g/dL — ABNORMAL LOW (ref 3.5–5.0)
Alkaline Phosphatase: 59 U/L (ref 38–126)
Anion gap: 7 (ref 5–15)
BUN: 13 mg/dL (ref 8–23)
CO2: 25 mmol/L (ref 22–32)
Calcium: 8.2 mg/dL — ABNORMAL LOW (ref 8.9–10.3)
Chloride: 99 mmol/L (ref 98–111)
Creatinine, Ser: 0.83 mg/dL (ref 0.61–1.24)
GFR, Estimated: >60 mL/min (ref >60)
Glucose, Bld: 179 mg/dL — ABNORMAL HIGH (ref 70–99)
Potassium: 3.4 mmol/L — ABNORMAL LOW (ref 3.5–5.1)
Sodium: 131 mmol/L — ABNORMAL LOW (ref 135–145)
Total Bilirubin: 0.7 mg/dL (ref 0.0–1.2)
Total Protein: 5.8 g/dL — ABNORMAL LOW (ref 6.5–8.1)

## 2023-11-29 LAB — CBC WITH DIFFERENTIAL/PLATELET
Abs Immature Granulocytes: 0.03 10*3/uL (ref 0.00–0.07)
Basophils Absolute: 0 10*3/uL (ref 0.0–0.1)
Basophils Relative: 0 %
Eosinophils Absolute: 0 10*3/uL (ref 0.0–0.5)
Eosinophils Relative: 0 %
HCT: 25.5 % — ABNORMAL LOW (ref 39.0–52.0)
Hemoglobin: 8.2 g/dL — ABNORMAL LOW (ref 13.0–17.0)
Immature Granulocytes: 1 %
Lymphocytes Relative: 7 %
Lymphs Abs: 0.4 10*3/uL — ABNORMAL LOW (ref 0.7–4.0)
MCH: 28.8 pg (ref 26.0–34.0)
MCHC: 32.2 g/dL (ref 30.0–36.0)
MCV: 89.5 fL (ref 80.0–100.0)
Monocytes Absolute: 0.1 10*3/uL (ref 0.1–1.0)
Monocytes Relative: 2 %
Neutro Abs: 4.8 10*3/uL (ref 1.7–7.7)
Neutrophils Relative %: 90 %
Platelets: 175 10*3/uL (ref 150–400)
RBC: 2.85 MIL/uL — ABNORMAL LOW (ref 4.22–5.81)
RDW: 15.9 % — ABNORMAL HIGH (ref 11.5–15.5)
WBC: 5.3 10*3/uL (ref 4.0–10.5)
nRBC: 0 % (ref 0.0–0.2)

## 2023-11-29 LAB — PHOSPHORUS: Phosphorus: 3 mg/dL (ref 2.5–4.6)

## 2023-11-29 LAB — MAGNESIUM: Magnesium: 1.9 mg/dL (ref 1.7–2.4)

## 2023-11-29 LAB — BRAIN NATRIURETIC PEPTIDE: B Natriuretic Peptide: 2033 pg/mL — ABNORMAL HIGH (ref 0.0–100.0)

## 2023-11-29 MED ORDER — LOPERAMIDE HCL 2 MG PO CAPS
2.0000 mg | ORAL_CAPSULE | ORAL | Status: DC | PRN
  Filled 2023-11-29: qty 1

## 2023-11-29 MED ORDER — CARVEDILOL 12.5 MG PO TABS
25.0000 mg | ORAL_TABLET | Freq: Two times a day (BID) | ORAL | Status: DC
Start: 2023-11-29 — End: 2023-12-02
  Administered 2023-11-29 – 2023-12-02 (×7): 25 mg via ORAL
  Filled 2023-11-29 (×7): qty 2

## 2023-11-29 MED ORDER — POTASSIUM CHLORIDE CRYS ER 20 MEQ PO TBCR: 40.0000 meq | EXTENDED_RELEASE_TABLET | Freq: Once | ORAL | Status: DC

## 2023-11-29 MED ORDER — POTASSIUM CHLORIDE CRYS ER 20 MEQ PO TBCR
40.0000 meq | EXTENDED_RELEASE_TABLET | Freq: Once | ORAL | Status: AC
  Administered 2023-11-29: 40 meq via ORAL
  Filled 2023-11-29: qty 2

## 2023-11-29 MED ORDER — FUROSEMIDE 10 MG/ML IJ SOLN
40.0000 mg | Freq: Two times a day (BID) | INTRAMUSCULAR | Status: DC
  Administered 2023-11-29 – 2023-11-30 (×4): 40 mg via INTRAVENOUS
  Filled 2023-11-29 (×4): qty 4

## 2023-11-29 MED ORDER — AMLODIPINE BESYLATE 5 MG PO TABS
5.0000 mg | ORAL_TABLET | Freq: Every day | ORAL | Status: DC
  Administered 2023-11-30: 5 mg via ORAL
  Filled 2023-11-29: qty 1

## 2023-11-29 MED ORDER — CARVEDILOL 12.5 MG PO TABS: 12.5000 mg | ORAL_TABLET | Freq: Two times a day (BID) | ORAL | Status: DC

## 2023-11-29 MED ORDER — LEVOFLOXACIN 500 MG PO TABS
750.0000 mg | ORAL_TABLET | Freq: Every day | ORAL | Status: DC
Start: 2023-11-29 — End: 2023-12-02
  Administered 2023-11-29 – 2023-12-02 (×3): 750 mg via ORAL
  Filled 2023-11-29 (×3): qty 1
  Filled 2023-11-29: qty 2

## 2023-11-29 MED ORDER — HYDRALAZINE HCL 50 MG PO TABS
50.0000 mg | ORAL_TABLET | Freq: Three times a day (TID) | ORAL | Status: DC
Start: 2023-11-29 — End: 2023-11-29

## 2023-11-29 NOTE — Inpatient Diabetes Management (Signed)
 Inpatient Diabetes Program Recommendations  AACE/ADA: New Consensus Statement on Inpatient Glycemic Control (2015)  Target Ranges:  Prepandial:   less than 140 mg/dL      Peak postprandial:   less than 180 mg/dL (1-2 hours)      Critically ill patients:  140 - 180 mg/dL    Latest Reference Range & Units 11/29/23 00:11 11/29/23 04:38 11/29/23 07:55 11/29/23 11:28  Glucose-Capillary 70 - 99 mg/dL 161 (H)  1 unit Novolog  194 (H)  1 unit Novolog  162 (H)  1 unit Novolog  218 (H)  3 units Novolog   (H): Data is abnormally high      Home: Farxiga 10 mg daily   Current Orders: Novolog 0-6 units Q4H    Getting Solumedrol 40 mg BID   CBGs elevated  MD- Please consider increasing the Novolog SSI to the 0-9 unit scale    --Will follow patient during hospitalization--  Ambrose Finland RN, MSN, CDCES Diabetes Coordinator Inpatient Glycemic Control Team Team Pager: [phone] (8a-5p)

## 2023-11-29 NOTE — Care Management Important Message (Signed)
 Important Message  Patient Details  Name: Connor Bryant MRN: [id number] Date of Birth: 06-09-1946   Important Message Given:  Yes - Medicare IM     Corey Harold 11/29/2023, 12:20 PM

## 2023-11-29 NOTE — Progress Notes (Signed)
 PROGRESS NOTE    Patient: Connor Bryant                            PCP: Connor Claude, MD                    DOB: [birth year]            DOA: 11/26/2023 ZOX:[id number]             DOS: 11/29/2023, 12:33 PM   LOS: 3 days   Date of Service: The patient was seen and examined on 11/29/2023  Subjective:   The patient was seen and examined this morning, stable no acute distress Noted for elevated blood pressure this morning Hemoglobin 8.1, 8.2 this morning Reporting of no active bleeding.   Brief Narrative:   Connor Bryant is 78 y.o. male with diabetes, hypertension, hyperlipidemia, CAD, chronic heart failure with reduced EF, paroxysmal A-fib on Eliquis, presents with generalized weakness, cough, shortness of breath.  He denies any blood in his stool but does endorse that it has been darker than normal.  He has also recently had bleeding from his nose and mouth and discontinued his Plavix for the last month.  In the   ED patient was found to be saturating the low 90s on room air, EKG reportedly revealed sinus bradycardia with first-degree AV block, PACs, incomplete RBBB and LAFB.   Chest x-ray revealed prominent bilateral interstitial opacities with retrocardiac opacity.   Labs reveal influenza A positive, positive FOBT, sodium 126, potassium 2.7, hemoglobin 9.7, troponin 161.   Gastroenterology Dr. Levon Bryant was consulted by EDP.  Patient was initiated on antibiotics, IV Protonix, IV potassium, IV fluids.       Assessment & Plan:   Principal Problem:   GI bleeding Active Problems:   Type 2 diabetes mellitus without complication, without long-term current use of insulin (HCC)   Elevated troponin   Rheumatoid arthritis involving multiple sites with positive rheumatoid factor (HCC)   Symptomatic anemia   Paroxysmal atrial fibrillation (HCC)   HFrEF (heart failure with reduced ejection fraction) (HCC)   Coronary artery disease involving native coronary artery of native heart without  angina pectoris   Hyponatremia   Hypokalemia   Influenza A   Melena   Acute hypoxic respiratory failure Acute worsening this AM--off BiPAP, could not tolerate 6 L of oxygen, satting 98%>>> Down to 2 L, satting 98% - Secondary to influenza pneumonia  - Reviewed chest x-ray-edema possible pneumonia - Continue to monitor in ICU setting -Continue respiratory support, nebs, flow oxygen by nasal cannula -Breathing treatment with Atrovent/Xopenex, initiating IV steroids   Influenza A- Pneumonia - Influenza A PCR positive - Continue empiric antibiotics for now. On Doxy and Ceft.>>  Patient to p.o. Levaquin - Strep pneumo and Legionella antigens -were negative - Continue Tamiflu,  - Droplet precautions - Continue supportive care   GI bleed, melena, FOBT + Acute on chronic anemia - Takes Eliquis and Plavix at home.   Stopped Plavix 1 month ago due to epistaxis. Hold Eliquis for now as well. - Hemoglobin downtrending baseline 11.4, down to 9.4 on arrival.  Appears to be stable at this time - GI consulted: Continue to follow closely, may need endoscopy once patient is more stable - Continue with PPI for now   Hyponatremia - Sodium 126 on arrival >>128 >>>131  - Status post 1 L NS in ED - Urine sodium, urine osmole's: -  Hold diuretics for now - Monitoring closely-trending    Hypokalemia - Continue IV replacement and PO (sodium 2.6 >>3.4 today 11/28/23) -Checking magnesium level repleting accordingly - Continue to trending    Chronic heart failure with reduced EF -Resp distress, - Echo as of 08/2023: LVEF 30 to 35%, mild left ventricular hypertrophy, global hypokinesis, left atrial dilation, right atrial dilation, mild MVR, moderate AVS. - Continue carvedilol for now - Continue to monitor volume status closely.  BNP 858. Currently euvolemic but did receive some fluids in the ED - Chest x-ray as above - Hold diuretics for now    Paroxysmal Afib  - Holding Eliquis as above -  Continue amiodarone and Coreg - (dosing dose of amiodarone -due to respiratory distress to reduce chance of toxicity)    CAD - Elevated Troponin - History of DES to LAD in December 2024 -- Trop 161 --> 148 - No chest pain.  Troponin elevation likely demand ischemia in setting of acute on chronic anemia and hypoxia - Continue Lipitor and Coreg   Type 2 diabetes - Hemoglobin A1c 6.6% in November 2024 - Low intensity sliding scale insulin if needed    Hypertension -BP, initiating as needed IV hydralazine, Home medication of Coreg increased dose, added Norvasc 11/28/23    ----------------------------------------------------------------------------------------------------------------------------------------------- Nutritional status:  The patient's BMI is: Body mass index is 27.98 kg/m. I agree with the assessment and plan as outlined --------------------------------------------------------------------------------------------------------------------------------------- Cultures; Blood Cultures x 2 >> NGTD Sputum Culture >> NGTD   ------------------------------------------------------------------------------------------------------------------------------------------------  DVT prophylaxis:  Place and maintain sequential compression device Start: 11/29/23 0634 SCDs Start: 11/26/23 1936   Code Status:   Code Status: Full Code  Family Communication: No family member present at bedside-  -Advance care planning has been discussed.   Admission status:   Status is: Inpatient Remains inpatient appropriate because: Needing respiratory support, BiPAP in ICU setting, IV antibiotics -monitoring for GI bleed   Disposition: From  - home             Planning for discharge in 2 days: to Home w [facility]    Procedures:   No admission procedures for hospital encounter.   Antimicrobials:  Anti-infectives (From admission, onward)    Start     Dose/Rate Route Frequency Ordered Stop   11/27/23  2200  oseltamivir (TAMIFLU) capsule 75 mg        75 mg Oral 2 times daily 11/27/23 1333 12/01/23 2159   11/27/23 1900  cefTRIAXone (ROCEPHIN) 2 g in sodium chloride 0.9 % 100 mL IVPB        2 g 200 mL/hr over 30 Minutes Intravenous Every 24 hours 11/26/23 1938 12/01/23 1859   11/27/23 1000  oseltamivir (TAMIFLU) capsule 30 mg  Status:  Discontinued        30 mg Oral 2 times daily 11/26/23 1938 11/27/23 1333   11/26/23 2000  doxycycline (VIBRAMYCIN) 100 mg in sodium chloride 0.9 % 250 mL IVPB        100 mg 125 mL/hr over 120 Minutes Intravenous Every 12 hours 11/26/23 1938     11/26/23 1945  oseltamivir (TAMIFLU) capsule 75 mg        75 mg Oral  Once 11/26/23 1943 11/26/23 2245   11/26/23 1900  cefTRIAXone (ROCEPHIN) 1 g in sodium chloride 0.9 % 100 mL IVPB        1 g 200 mL/hr over 30 Minutes Intravenous  Once 11/26/23 1856 11/26/23 2038   11/26/23 1900  azithromycin (ZITHROMAX) 500 mg in  sodium chloride 0.9 % 250 mL IVPB  Status:  Discontinued        500 mg 250 mL/hr over 60 Minutes Intravenous  Once 11/26/23 1856 11/26/23 1938        Medication:   acidophilus  2 capsule Oral TID   [START ON 11/30/2023] amiodarone  100 mg Oral Daily   amLODipine  10 mg Oral Daily   atorvastatin  80 mg Oral Daily   budesonide (PULMICORT) nebulizer solution  0.25 mg Nebulization BID   carvedilol  25 mg Oral BID   Chlorhexidine Gluconate Cloth  6 each Topical Daily   guaiFENesin  10 mL Oral Q8H   insulin aspart  0-6 Units Subcutaneous Q4H   ipratropium  0.5 mg Nebulization Q6H   levalbuterol  1.25 mg Nebulization Q6H   methylPREDNISolone (SOLU-MEDROL) injection  40 mg Intravenous Q12H   oseltamivir  75 mg Oral BID   pantoprazole (PROTONIX) IV  40 mg Intravenous Q12H   sodium chloride flush  3 mL Intravenous Q12H   sodium chloride  1 g Oral BID WC    acetaminophen **OR** acetaminophen, hydrALAZINE, labetalol, oxyCODONE   Objective:   Vitals:   11/29/23 0900 11/29/23 1000 11/29/23 1100  11/29/23 1147  BP: (!) 165/46 (!) 129/46 (!) 118/37   Pulse: 70 60 (!) 56   Resp: 18 16 13    Temp:    98.4 F (36.9 C)  TempSrc:    Oral  SpO2: 96% 96% 98%   Weight:      Height:        Intake/Output Summary (Last 24 hours) at 11/29/2023 1233 Last data filed at 11/29/2023 1232 Gross per 24 hour  Intake 1330.37 ml  Output 1400 ml  Net -69.63 ml   Filed Weights   11/26/23 1259  Weight: 83.5 kg     Physical examination:   Constitution:  Alert, cooperative, mild-moderate distress, or shortness of breath, refusing BiPAP Psychiatric:   Normal and stable mood and affect, cognition intact,   HEENT:        Normocephalic, PERRL, otherwise with in Normal limits  Chest:         Chest symmetric Cardio vascular:  S1/S2, RRR, No murmure, No Rubs or Gallops  pulmonary: Diffuse rhonchi, mild wheezing, minimal lower lobe crackles, positive breath sounds Mild-moderate use of accessory muscles with labored breathing-  Abdomen: Soft, non-tender, non-distended, bowel sounds,no masses, no organomegaly Muscular skeletal: Limited exam - in bed, able to move all 4 extremities,   Neuro: CNII-XII intact. , normal motor and sensation, reflexes intact  Extremities: No pitting edema lower extremities, +2 pulses  Skin: Dry, warm to touch, negative for any Rashes, No open wounds Wounds: per nursing documentation   ------------------------------------------------------------------------------------------------------------------------------------------    LABs:     Latest Ref Rng & Units 11/29/2023    4:18 AM 11/28/2023    4:45 AM 11/27/2023    8:43 PM  CBC  WBC 4.0 - 10.5 K/uL 5.3  4.3  6.2   Hemoglobin 13.0 - 17.0 g/dL 8.2  8.1  8.7   Hematocrit 39.0 - 52.0 % 25.5  25.0  27.0   Platelets 150 - 400 K/uL 175  146  153       Latest Ref Rng & Units 11/29/2023    4:18 AM 11/28/2023    5:07 PM 11/28/2023    4:45 AM  CMP  Glucose 70 - 99 mg/dL 604  540  981   BUN 8 - 23 mg/dL 13  12  10   Creatinine 0.61 -  1.24 mg/dL 1.61  0.96  0.45   Sodium 135 - 145 mmol/L 131  131  128   Potassium 3.5 - 5.1 mmol/L 3.4  3.8  2.6   Chloride 98 - 111 mmol/L 99  96  97   CO2 22 - 32 mmol/L 25  27  27    Calcium 8.9 - 10.3 mg/dL 8.2  8.0  7.5   Total Protein 6.5 - 8.1 g/dL 5.8   5.4   Total Bilirubin 0.0 - 1.2 mg/dL 0.7   0.6   Alkaline Phos 38 - 126 U/L 59   57   AST 15 - 41 U/L 39   53   ALT 0 - 44 U/L 51   53        Micro Results Recent Results (from the past 240 hours)  Resp panel by RT-PCR (RSV, Flu A&B, Covid) Anterior Nasal Swab     Status: Abnormal   Collection Time: 11/26/23 12:59 PM   Specimen: Anterior Nasal Swab  Result Value Ref Range Status   SARS Coronavirus 2 by RT PCR NEGATIVE NEGATIVE Final    Comment: (NOTE) SARS-CoV-2 target nucleic acids are NOT DETECTED.  The SARS-CoV-2 RNA is generally detectable in upper respiratory specimens during the acute phase of infection. The lowest concentration of SARS-CoV-2 viral copies this assay can detect is 138 copies/mL. A negative result does not preclude SARS-Cov-2 infection and should not be used as the sole basis for treatment or other patient management decisions. A negative result may occur with  improper specimen collection/handling, submission of specimen other than nasopharyngeal swab, presence of viral mutation(s) within the areas targeted by this assay, and inadequate number of viral copies(<138 copies/mL). A negative result must be combined with clinical observations, patient history, and epidemiological information. The expected result is Negative.  Fact Sheet for Patients:  BloggerCourse.com  Fact Sheet for Healthcare Providers:  SeriousBroker.it  This test is no t yet approved or cleared by the [place] FDA and  has been authorized for detection and/or diagnosis of SARS-CoV-2 by FDA under an Emergency Use Authorization (EUA). This EUA will remain  in effect (meaning  this test can be used) for the duration of the COVID-19 declaration under Section 564(b)(1) of the Act, 21 U.S.C.section 360bbb-3(b)(1), unless the authorization is terminated  or revoked sooner.       Influenza A by PCR POSITIVE (A) NEGATIVE Final   Influenza B by PCR NEGATIVE NEGATIVE Final    Comment: (NOTE) The Xpert Xpress SARS-CoV-2/FLU/RSV plus assay is intended as an aid in the diagnosis of influenza from Nasopharyngeal swab specimens and should not be used as a sole basis for treatment. Nasal washings and aspirates are unacceptable for Xpert Xpress SARS-CoV-2/FLU/RSV testing.  Fact Sheet for Patients: BloggerCourse.com  Fact Sheet for Healthcare Providers: SeriousBroker.it  This test is not yet approved or cleared by the [place] FDA and has been authorized for detection and/or diagnosis of SARS-CoV-2 by FDA under an Emergency Use Authorization (EUA). This EUA will remain in effect (meaning this test can be used) for the duration of the COVID-19 declaration under Section 564(b)(1) of the Act, 21 U.S.C. section 360bbb-3(b)(1), unless the authorization is terminated or revoked.     Resp Syncytial Virus by PCR NEGATIVE NEGATIVE Final    Comment: (NOTE) Fact Sheet for Patients: BloggerCourse.com  Fact Sheet for Healthcare Providers: SeriousBroker.it  This test is not yet approved or cleared by the [place] and  has been authorized for detection and/or diagnosis of SARS-CoV-2 by FDA under an Emergency Use Authorization (EUA). This EUA will remain in effect (meaning this test can be used) for the duration of the COVID-19 declaration under Section 564(b)(1) of the Act, 21 U.S.C. section 360bbb-3(b)(1), unless the authorization is terminated or revoked.  Performed at [facility], [address], [place], [place] [address]   MRSA Next Gen by PCR, Nasal      Status: None   Collection Time: 11/27/23  8:00 AM   Specimen: Nasal Mucosa; Nasal Swab  Result Value Ref Range Status   MRSA by PCR Next Gen NOT DETECTED NOT DETECTED Final    Comment: (NOTE) The GeneXpert MRSA Assay (FDA approved for NASAL specimens only), is one component of a comprehensive MRSA colonization surveillance program. It is not intended to diagnose MRSA infection nor to guide or monitor treatment for MRSA infections. Test performance is not FDA approved in patients less than 7 years old. Performed at [facility], [address] [place], [place] [address]     Radiology Reports No results found.  SIGNED: Kendell Bane, MD, FHM. FAAFP. [facility] - [facility] Critical care time spent - 55 min.  In seeing, evaluating and examining the patient. Reviewing medical records, labs, drawn plan of care. [facility],  Pager (please use [email or web address] to page/ text) Please use Epic Secure Chat for non-urgent communication (7AM-7PM)  If 7PM-7AM, please contact night-coverage [email or web address], 11/29/2023, 12:33 PM

## 2023-11-29 NOTE — Plan of Care (Signed)

## 2023-11-29 NOTE — Plan of Care (Signed)
  Problem: Coping: Goal: Ability to adjust to condition or change in health will improve Outcome: Progressing   Problem: Skin Integrity: Goal: Risk for impaired skin integrity will decrease Outcome: Progressing   Problem: Tissue Perfusion: Goal: Adequacy of tissue perfusion will improve Outcome: Progressing   Problem: Coping: Goal: Level of anxiety will decrease Outcome: Progressing

## 2023-11-29 NOTE — Progress Notes (Signed)
 Gastroenterology Progress Note   Referring Provider: No ref. provider found Primary Care Physician:  Mechele Claude, MD Primary Gastroenterologist:  Dr. Leone Payor  Patient ID: Connor Bryant; [id number]; Jul 18, 1946    Subjective   Did not sleep well, reports anxiety about hypertension overnight. Breathing is improved per patient today. Last BM this morning with dark still but not black. No abdominal pain, N/V. Denies chest pain.   Observed on 2L [place] today    Objective   Vital signs in last 24 hours Temp:  [97.8 F (36.6 C)-98.8 F (37.1 C)] 98.8 F (37.1 C) (03/07 0806) Pulse Rate:  [60-73] 60 (03/07 1000) Resp:  [14-22] 16 (03/07 1000) BP: (129-205)/(41-76) 129/46 (03/07 1000) SpO2:  [93 %-99 %] 96 % (03/07 1000) Last BM Date : 11/29/23  Physical Exam General:   Alert and oriented, pleasant Head:  Normocephalic and atraumatic. Eyes:  No icterus, sclera clear. Conjuctiva pink.  Mouth:  Without lesions, mucosa pink and moist.  Neck:  Supple, without thyromegaly or masses.  Heart:  irregular, rate controlled Afib Lungs: Expiratory rhonchi. .  Abdomen:  Bowel sounds present, soft, non-tender, non-distended. No HSM or hernias noted. No rebound or guarding. No masses appreciated  Msk:  Symmetrical without gross deformities. Normal posture. Neurologic:  Alert and  oriented x4;  grossly normal neurologically. Skin:  Warm and dry, scattered bruising. Skin tear to right hand Psych:  Alert and cooperative. Normal mood and affect.  Intake/Output from previous day: 03/06 0701 - 03/07 0700 In: 853.4 [P.O.:240; I.V.:13; IV Piggyback:600.4] Out: 700 [Urine:700] Intake/Output this shift: Total I/O In: 240 [P.O.:240] Out: 700 [Urine:700]  Lab Results  Recent Labs    11/27/23 2043 11/28/23 0445 11/29/23 0418  WBC 6.2 4.3 5.3  HGB 8.7* 8.1* 8.2*  HCT 27.0* 25.0* 25.5*  PLT 153 146* 175   BMET Recent Labs    11/28/23 0445 11/28/23 1707 11/29/23 0418  NA 128* 131* 131*   K 2.6* 3.8 3.4*  CL 97* 96* 99  CO2 27 27 25   GLUCOSE 127* 187* 179*  BUN 10 12 13   CREATININE 0.90 0.90 0.83  CALCIUM 7.5* 8.0* 8.2*   LFT Recent Labs    11/27/23 0139 11/28/23 0445 11/29/23 0418  PROT 5.7* 5.4* 5.8*  ALBUMIN 2.8* 2.6* 2.7*  AST 53* 53* 39  ALT 47* 53* 51*  ALKPHOS 59 57 59  BILITOT 0.9 0.6 0.7   PT/INR No results for input(s): "LABPROT", "INR" in the last 72 hours. Hepatitis Panel Recent Labs    11/27/23 0139  HEPBSAG NON REACTIVE  HCVAB NON REACTIVE  HEPAIGM NON REACTIVE  HEPBIGM NON REACTIVE    Studies/Results DG Chest Port 1 View Result Date: 11/27/2023 CLINICAL DATA:  Dyspnea. EXAM: PORTABLE CHEST 1 VIEW COMPARISON:  November 26, 2023. FINDINGS: Stable cardiomegaly is noted. Increased interstitial densities are noted throughout both lungs most consistent with pulmonary edema. Old left clavicular fracture is noted. IMPRESSION: Probable bilateral pulmonary edema. Electronically Signed   By: Lupita Raider M.D.   On: 11/27/2023 09:49   DG Chest Portable 1 View Result Date: 11/26/2023 CLINICAL DATA:  Several day history of weakness, cough, congestion EXAM: PORTABLE CHEST 1 VIEW COMPARISON:  Chest radiograph dated 09/12/2023 FINDINGS: Patient is rotated to the left. Normal lung volumes. Vascular calcifications project over the bilateral apices. Prominent bilateral interstitial opacities. Patchy left retrocardiac opacities. No pleural effusion or pneumothorax. Similar mildly enlarged cardiomediastinal silhouette. No acute osseous abnormality. IMPRESSION: 1. Prominent bilateral interstitial opacities, which may  represent pulmonary edema. 2. Patchy left retrocardiac opacities, which may represent atelectasis, aspiration, or pneumonia. Electronically Signed   By: Agustin Cree M.D.   On: 11/26/2023 18:19    Assessment  78 y.o. male with a history of rheumatoid arthritis on methotrexate and prednisone, A-fib on Eliquis, previous GI bleed in November 2024, type 2  diabetes, CVA, colon polyps who presented to the ED with abdominal pain, cough, and diarrhea as well as congestion and generalized weakness for 5 days.  Also had reported dark stools, GI consulted given heme positive stool and worsening anemia.  Anemia/melena: -Stool Hemoccult positive. -History of upper GI bleed in November 2024 thought to be secondary to gastritis exacerbated by Eliquis. -Has been unsure if he is taking PPI outpatient, usually follows with [facility] despite their recommendation at last office visit to continue. -Hemoglobin in the 11 range 2 months ago, 9 initially this admission, down trended to 8, currently stable at 8.2.  Has not received any blood products. -Could be dealing with gastritis in the setting of chronic anticoagulation without PPI.  Denies overt melena but does report dark stools.  Continue to wait for significant respiratory status improvement.  He has not currently on BiPAP or 6 L nasal cannula, he has been weaned to 2 L nasal cannula however does continue to have some rhonchi bilaterally and BNP elevated.  Discussed with Dr. Levon Hedger today, he would like to hold off on endoscopic evaluation until respiratory status improved.  He was also on regular diet today and was not candidate for endoscopy today.  Could have procedure outpatient  given his hgb has been stable however he would prefer procedure to be done inpatient prior to discharge.  Plan / Recommendations  PPI BID Hold Eliquis Continue to monitor for overt GI bleeding Trend H/H, transfuse for hgb <7 EGD possibly prior to discharge once BNP and respiratory status is significantly improved    LOS: 3 days   11/29/2023, 10:57 AM   Brooke Bonito, MSN, FNP-BC, AGACNP-BC [facility]

## 2023-11-30 ENCOUNTER — Inpatient Hospital Stay (HOSPITAL_COMMUNITY): Admitting: Anesthesiology

## 2023-11-30 ENCOUNTER — Encounter (HOSPITAL_COMMUNITY)
Admission: EM | Disposition: A | Discharge status: Home Health | Payer: Self-pay | Source: Home / Self Care | Attending: Family Medicine

## 2023-11-30 ENCOUNTER — Encounter (HOSPITAL_COMMUNITY): Payer: Self-pay | Admitting: Family Medicine

## 2023-11-30 DIAGNOSIS — K449 Diaphragmatic hernia without obstruction or gangrene: Secondary | ICD-10-CM

## 2023-11-30 DIAGNOSIS — K922 Gastrointestinal hemorrhage, unspecified: Secondary | ICD-10-CM | POA: Diagnosis not present

## 2023-11-30 DIAGNOSIS — J9601 Acute respiratory failure with hypoxia: Secondary | ICD-10-CM | POA: Diagnosis not present

## 2023-11-30 HISTORY — PX: ESOPHAGOGASTRODUODENOSCOPY: SHX5428

## 2023-11-30 LAB — CBC WITH DIFFERENTIAL/PLATELET
Abs Immature Granulocytes: 0.07 10*3/uL (ref 0.00–0.07)
Basophils Absolute: 0 10*3/uL (ref 0.0–0.1)
Basophils Relative: 0 %
Eosinophils Absolute: 0.1 10*3/uL (ref 0.0–0.5)
Eosinophils Relative: 1 %
HCT: 26.2 % — ABNORMAL LOW (ref 39.0–52.0)
Hemoglobin: 8.4 g/dL — ABNORMAL LOW (ref 13.0–17.0)
Immature Granulocytes: 1 %
Lymphocytes Relative: 5 %
Lymphs Abs: 0.4 10*3/uL — ABNORMAL LOW (ref 0.7–4.0)
MCH: 28.6 pg (ref 26.0–34.0)
MCHC: 32.1 g/dL (ref 30.0–36.0)
MCV: 89.1 fL (ref 80.0–100.0)
Monocytes Absolute: 0.2 10*3/uL (ref 0.1–1.0)
Monocytes Relative: 3 %
Neutro Abs: 7.2 10*3/uL (ref 1.7–7.7)
Neutrophils Relative %: 90 %
Platelets: 211 10*3/uL (ref 150–400)
RBC: 2.94 MIL/uL — ABNORMAL LOW (ref 4.22–5.81)
RDW: 16.1 % — ABNORMAL HIGH (ref 11.5–15.5)
WBC: 8 10*3/uL (ref 4.0–10.5)
nRBC: 0 % (ref 0.0–0.2)

## 2023-11-30 LAB — COMPREHENSIVE METABOLIC PANEL
ALT: 43 U/L (ref 0–44)
AST: 29 U/L (ref 15–41)
Albumin: 2.8 g/dL — ABNORMAL LOW (ref 3.5–5.0)
Alkaline Phosphatase: 65 U/L (ref 38–126)
Anion gap: 12 (ref 5–15)
BUN: 18 mg/dL (ref 8–23)
CO2: 26 mmol/L (ref 22–32)
Calcium: 8.6 mg/dL — ABNORMAL LOW (ref 8.9–10.3)
Chloride: 95 mmol/L — ABNORMAL LOW (ref 98–111)
Creatinine, Ser: 0.91 mg/dL (ref 0.61–1.24)
GFR, Estimated: >60 mL/min (ref >60)
Glucose, Bld: 174 mg/dL — ABNORMAL HIGH (ref 70–99)
Potassium: 3.7 mmol/L (ref 3.5–5.1)
Sodium: 133 mmol/L — ABNORMAL LOW (ref 135–145)
Total Bilirubin: 0.7 mg/dL (ref 0.0–1.2)
Total Protein: 6.1 g/dL — ABNORMAL LOW (ref 6.5–8.1)

## 2023-11-30 LAB — MAGNESIUM: Magnesium: 1.6 mg/dL — ABNORMAL LOW (ref 1.7–2.4)

## 2023-11-30 LAB — IRON AND TIBC
Iron: 76 ug/dL (ref 45–182)
Saturation Ratios: 35 % (ref 17.9–39.5)
TIBC: 215 ug/dL — ABNORMAL LOW (ref 250–450)
UIBC: 139 ug/dL

## 2023-11-30 LAB — GLUCOSE, CAPILLARY
Glucose-Capillary: 182 mg/dL — ABNORMAL HIGH (ref 70–99)
Glucose-Capillary: 193 mg/dL — ABNORMAL HIGH (ref 70–99)
Glucose-Capillary: 188 mg/dL — ABNORMAL HIGH (ref 70–99)
Glucose-Capillary: 192 mg/dL — ABNORMAL HIGH (ref 70–99)
Glucose-Capillary: 207 mg/dL — ABNORMAL HIGH (ref 70–99)
Glucose-Capillary: 177 mg/dL — ABNORMAL HIGH (ref 70–99)

## 2023-11-30 LAB — PHOSPHORUS: Phosphorus: 3.1 mg/dL (ref 2.5–4.6)

## 2023-11-30 SURGERY — EGD (ESOPHAGOGASTRODUODENOSCOPY)
Anesthesia: General

## 2023-11-30 MED ORDER — APIXABAN 2.5 MG PO TABS: 2.5000 mg | ORAL_TABLET | Freq: Two times a day (BID) | ORAL | Status: DC

## 2023-11-30 MED ORDER — INSULIN ASPART 100 UNIT/ML IJ SOLN
0.0000 [IU] | Freq: Three times a day (TID) | INTRAMUSCULAR | Status: DC
  Administered 2023-11-30: 4 [IU] via SUBCUTANEOUS
  Administered 2023-11-30: 1 [IU] via SUBCUTANEOUS
  Administered 2023-11-30 – 2023-12-01 (×2): 7 [IU] via SUBCUTANEOUS
  Administered 2023-12-01: 11 [IU] via SUBCUTANEOUS
  Administered 2023-12-01 – 2023-12-02 (×2): 4 [IU] via SUBCUTANEOUS

## 2023-11-30 MED ORDER — CLOPIDOGREL BISULFATE 75 MG PO TABS
75.0000 mg | ORAL_TABLET | Freq: Every day | ORAL | Status: DC
  Administered 2023-12-02: 75 mg via ORAL
  Filled 2023-11-30 (×2): qty 1

## 2023-11-30 MED ORDER — LEVALBUTEROL HCL 0.63 MG/3ML IN NEBU: 0.6300 mg | INHALATION_SOLUTION | Freq: Four times a day (QID) | RESPIRATORY_TRACT | Status: DC | PRN

## 2023-11-30 MED ORDER — LACTATED RINGERS IV SOLN: INTRAVENOUS | Status: DC | PRN

## 2023-11-30 MED ORDER — PROPOFOL 10 MG/ML IV BOLUS
INTRAVENOUS | Status: DC | PRN
  Administered 2023-11-30: 180 mg via INTRAVENOUS

## 2023-11-30 MED ORDER — MAGNESIUM SULFATE 2 GM/50ML IV SOLN
2.0000 g | Freq: Once | INTRAVENOUS | Status: AC
  Administered 2023-11-30: 2 g via INTRAVENOUS
  Filled 2023-11-30: qty 50

## 2023-11-30 MED ORDER — PROPOFOL 10 MG/ML IV BOLUS
INTRAVENOUS | Status: AC
  Filled 2023-11-30: qty 20

## 2023-11-30 MED ORDER — METHYLPREDNISOLONE SODIUM SUCC 40 MG IJ SOLR
40.0000 mg | INTRAMUSCULAR | Status: DC
  Administered 2023-11-30: 40 mg via INTRAVENOUS
  Filled 2023-11-30: qty 1

## 2023-11-30 MED ORDER — IPRATROPIUM BROMIDE 0.02 % IN SOLN
0.5000 mg | Freq: Three times a day (TID) | RESPIRATORY_TRACT | Status: DC
  Administered 2023-11-30 – 2023-12-02 (×7): 0.5 mg via RESPIRATORY_TRACT
  Filled 2023-11-30 (×7): qty 2.5

## 2023-11-30 MED ORDER — PANTOPRAZOLE SODIUM 40 MG PO TBEC
40.0000 mg | DELAYED_RELEASE_TABLET | Freq: Two times a day (BID) | ORAL | Status: DC
  Administered 2023-11-30 – 2023-12-02 (×4): 40 mg via ORAL
  Filled 2023-11-30 (×4): qty 1

## 2023-11-30 MED ORDER — APIXABAN 2.5 MG PO TABS
2.5000 mg | ORAL_TABLET | Freq: Two times a day (BID) | ORAL | Status: DC
  Administered 2023-12-01 – 2023-12-02 (×3): 2.5 mg via ORAL
  Filled 2023-11-30 (×3): qty 1

## 2023-11-30 MED ORDER — LEVALBUTEROL HCL 1.25 MG/0.5ML IN NEBU
1.2500 mg | INHALATION_SOLUTION | Freq: Three times a day (TID) | RESPIRATORY_TRACT | Status: DC
  Administered 2023-11-30 – 2023-12-02 (×7): 1.25 mg via RESPIRATORY_TRACT
  Filled 2023-11-30 (×7): qty 0.5

## 2023-11-30 NOTE — Anesthesia Preprocedure Evaluation (Signed)
 Anesthesia Evaluation  Patient identified by MRN, date of birth, ID band Patient awake    Reviewed: Allergy & Precautions, H&P , NPO status , Patient's Chart, lab work & pertinent test results, reviewed documented beta blocker date and time   Airway Mallampati: II  TM Distance: >3 FB Neck ROM: full    Dental no notable dental hx.    Pulmonary neg pulmonary ROS, former smoker   Pulmonary exam normal breath sounds clear to auscultation       Cardiovascular Exercise Tolerance: Good hypertension, + CAD, + Past MI, + Peripheral Vascular Disease and +CHF  negative cardio ROS + Valvular Problems/Murmurs  Rhythm:regular Rate:Normal     Neuro/Psych  Neuromuscular disease CVA negative neurological ROS  negative psych ROS   GI/Hepatic negative GI ROS, Neg liver ROS,,,  Endo/Other  negative endocrine ROSdiabetes    Renal/GU Renal diseasenegative Renal ROS  negative genitourinary   Musculoskeletal   Abdominal   Peds  Hematology negative hematology ROS (+) Blood dyscrasia, anemia   Anesthesia Other Findings   Reproductive/Obstetrics negative OB ROS                             Anesthesia Physical Anesthesia Plan  ASA: 4 and emergent  Anesthesia Plan: General   Post-op Pain Management:    Induction:   PONV Risk Score and Plan: Propofol infusion  Airway Management Planned:   Additional Equipment:   Intra-op Plan:   Post-operative Plan:   Informed Consent: I have reviewed the patients History and Physical, chart, labs and discussed the procedure including the risks, benefits and alternatives for the proposed anesthesia with the patient or authorized representative who has indicated his/her understanding and acceptance.     Dental Advisory Given  Plan Discussed with: CRNA  Anesthesia Plan Comments:        Anesthesia Quick Evaluation

## 2023-11-30 NOTE — Anesthesia Postprocedure Evaluation (Signed)
 Anesthesia Post Note  Patient: Connor Bryant  Procedure(s) Performed: EGD (ESOPHAGOGASTRODUODENOSCOPY)  Patient location during evaluation: PACU Anesthesia Type: General Level of consciousness: awake and alert Pain management: pain level controlled Vital Signs Assessment: post-procedure vital signs reviewed and stable Respiratory status: spontaneous breathing, nonlabored ventilation, respiratory function stable and patient connected to nasal cannula oxygen Cardiovascular status: blood pressure returned to baseline and stable Postop Assessment: no apparent nausea or vomiting Anesthetic complications: no   No notable events documented.   Last Vitals:  Vitals:   11/30/23 0742 11/30/23 0844  BP: (!) 170/62   Pulse: 64   Resp: 14   Temp:    SpO2: 97% (!) 88%    Last Pain:  Vitals:   11/30/23 0700  TempSrc: Oral  PainSc:                  Windell Norfolk

## 2023-11-30 NOTE — Plan of Care (Signed)
  Problem: Coping: Goal: Ability to adjust to condition or change in health will improve Outcome: Progressing   Problem: Fluid Volume: Goal: Ability to maintain a balanced intake and output will improve Outcome: Progressing   Problem: Nutritional: Goal: Maintenance of adequate nutrition will improve Outcome: Progressing   Problem: Skin Integrity: Goal: Risk for impaired skin integrity will decrease Outcome: Progressing   Problem: Tissue Perfusion: Goal: Adequacy of tissue perfusion will improve Outcome: Progressing   Problem: Education: Goal: Knowledge of General Education information will improve Description: Including pain rating scale, medication(s)/side effects and non-pharmacologic comfort measures Outcome: Progressing

## 2023-11-30 NOTE — Brief Op Note (Addendum)
 11/30/2023  10:14 AM  PATIENT:  [name]  78 y.o. male  PRE-OPERATIVE DIAGNOSIS:  anemia  POST-OPERATIVE DIAGNOSIS:  hiatal, hernia;minimal food noted in stomach  PROCEDURE:  Procedure(s): EGD (ESOPHAGOGASTRODUODENOSCOPY) (N/A)  SURGEON:  Surgeons and Role:    * Dolores Frame, MD - Primary  Patient underwent EGD under propofol sedation.  Tolerated the procedure adequately.   FINDINGS: - Small amount of food in the lower third of the esophagus.  - 1 cm hiatal hernia.  - A small amount of food (residue) in the stomach.  - Normal examined duodenum.   RECOMMENDATIONS - Return patient to hospital ward for ongoing care.  - Resume previous diet.  - Follow up with Dr. Leone Payor in GI clinic. - Ok to restart anticoagulation, may need to evaluate other reasons for anemia given normal iron stores and no clinical bleeding. - GI service will sign-off, please call us back if you have any more questions.  Katrinka Blazing, MD Gastroenterology and Hepatology [facility]

## 2023-11-30 NOTE — Progress Notes (Signed)
 PROGRESS NOTE    Patient: Connor Bryant                            PCP: Mechele Claude, MD                    DOB: October 14, 1945            DOA: 11/26/2023 ZOX:[id number]             DOS: 11/30/2023, 9:28 AM   LOS: 4 days   Date of Service: The patient was seen and examined on 11/30/2023  Subjective:   The patient was seen and examined this morning, stable no acute distress Noted for elevated blood pressure this morning Hemoglobin 8.1, 8.2 this morning Reporting of no active bleeding.   Brief Narrative:   Connor Bryant is 78 y.o. male with diabetes, hypertension, hyperlipidemia, CAD, chronic heart failure with reduced EF, paroxysmal A-fib on Eliquis, presents with generalized weakness, cough, shortness of breath.  He denies any blood in his stool but does endorse that it has been darker than normal.  He has also recently had bleeding from his nose and mouth and discontinued his Plavix for the last month.  In the   ED patient was found to be saturating the low 90s on room air, EKG reportedly revealed sinus bradycardia with first-degree AV block, PACs, incomplete RBBB and LAFB.   Chest x-ray revealed prominent bilateral interstitial opacities with retrocardiac opacity.   Labs reveal influenza A positive, positive FOBT, sodium 126, potassium 2.7, hemoglobin 9.7, troponin 161.   Gastroenterology Dr. Levon Hedger was consulted by EDP.  Patient was initiated on antibiotics, IV Protonix, IV potassium, IV fluids.       Assessment & Plan:   Principal Problem:   GI bleeding Active Problems:   Type 2 diabetes mellitus without complication, without long-term current use of insulin (HCC)   Elevated troponin   Rheumatoid arthritis involving multiple sites with positive rheumatoid factor (HCC)   Symptomatic anemia   Paroxysmal atrial fibrillation (HCC)   HFrEF (heart failure with reduced ejection fraction) (HCC)   Coronary artery disease involving native coronary artery of native heart without  angina pectoris   Hyponatremia   Hypokalemia   Influenza A   Melena   Acute hypoxic respiratory failure -Continue to improve, remains stable on 2 L Acute worsening this AM--off BiPAP, could not tolerate 6 L of oxygen, satting 98%>>> Down to 2 L, satting 98% - Secondary to influenza pneumonia  - Reviewed chest x-ray-edema possible pneumonia  -Will continue with breathing treatment with Atrovent/Xopenex,  IV steroids -tapering down   Influenza A- Pneumonia - Influenza A PCR positive - Continue empiric antibiotics for now. On Doxy and Ceft.>>  Patient to p.o. Levaquin - Strep pneumo and Legionella antigens -were negative - Continue Tamiflu,  - Droplet precautions - Continue supportive care   GI bleed, melena, FOBT + Acute on chronic anemia Hemoglobin down to 8.4 - Takes Eliquis and Plavix at home.   -Patient stopped taking his Plavix on his own.  Stopped Plavix 1 month ago due to epistaxis. Hold Eliquis for now as well. - Hemoglobin downtrending baseline 11.4, down to 9.4 on arrival.   - GI consulted: Continue to follow closely, may need endoscopy once patient is more stable - Continue with PPI for now 11/30/2023, planning for EGD  Hyponatremia - Sodium 126 on arrival >>128 >>>131, 133  - Status post 1 L  NS in ED  - Hold diuretics for now -Monitoring    Hypokalemia - Continue IV replacement and PO (sodium 2.6 >>3.4, 3.7 ) -Monitoring potassium and magnesium level and repleting accordingly - Continue to trending    Chronic heart failure with reduced EF -Resp distress, - Echo as of 08/2023: LVEF 30 to 35%, mild left ventricular hypertrophy, global hypokinesis, left atrial dilation, right atrial dilation, mild MVR, moderate AVS. -With history of PCI and stenting - Continue carvedilol for now - Continue to monitor volume status closely.  BNP 858 >>> 2033.0  -Start IV Lasix 40 mg daily  - Chest x-ray as above  -Reviewed cardiology note, consult with cardiology at [facility]  --appreciate neuroinput    Paroxysmal Afib  - Holding Eliquis as above - Continue amiodarone and Coreg - (dosing dose of amiodarone -due to respiratory distress to reduce chance of toxicity)    CAD - Elevated Troponin - History of DES to LAD in December 2024 -- Trop 161 --> 148 - No chest pain.  Troponin elevation likely demand ischemia in setting of acute on chronic anemia and hypoxia - Continue Lipitor and Coreg   Type 2 diabetes - Hemoglobin A1c 6.6% in November 2024 - Low intensity sliding scale insulin if needed    Hypertension -BP, initiating as needed IV hydralazine, Home medication of Coreg increased dose, added Norvasc 11/28/23    ----------------------------------------------------------------------------------------------------------------------------------------------- Nutritional status:  The patient's BMI is: Body mass index is 27.98 kg/m. I agree with the assessment and plan as outlined --------------------------------------------------------------------------------------------------------------------------------------- Cultures; Blood Cultures x 2 >> NGTD Sputum Culture >> NGTD   ------------------------------------------------------------------------------------------------------------------------------------------------  DVT prophylaxis:  Place and maintain sequential compression device Start: 11/29/23 0634 SCDs Start: 11/26/23 1936   Code Status:   Code Status: Full Code  Family Communication: No family member present at bedside-  -Advance care planning has been discussed.   Admission status:   Status is: Inpatient Remains inpatient appropriate because: Needing respiratory support, BiPAP in ICU setting, IV antibiotics -monitoring for GI bleed   Disposition: From  - home             Planning for discharge in 2 days: to Home w [facility]    Procedures:   No admission procedures for hospital encounter.   Antimicrobials:  Anti-infectives (From  admission, onward)    Start     Dose/Rate Route Frequency Ordered Stop   11/29/23 1330  levofloxacin (LEVAQUIN) tablet 750 mg        750 mg Oral Daily 11/29/23 1235     11/27/23 2200  oseltamivir (TAMIFLU) capsule 75 mg        75 mg Oral 2 times daily 11/27/23 1333 12/01/23 2159   11/27/23 1900  cefTRIAXone (ROCEPHIN) 2 g in sodium chloride 0.9 % 100 mL IVPB  Status:  Discontinued        2 g 200 mL/hr over 30 Minutes Intravenous Every 24 hours 11/26/23 1938 11/29/23 1235   11/27/23 1000  oseltamivir (TAMIFLU) capsule 30 mg  Status:  Discontinued        30 mg Oral 2 times daily 11/26/23 1938 11/27/23 1333   11/26/23 2000  doxycycline (VIBRAMYCIN) 100 mg in sodium chloride 0.9 % 250 mL IVPB  Status:  Discontinued        100 mg 125 mL/hr over 120 Minutes Intravenous Every 12 hours 11/26/23 1938 11/29/23 1235   11/26/23 1945  oseltamivir (TAMIFLU) capsule 75 mg        75 mg Oral  Once 11/26/23  1943 11/26/23 2245   11/26/23 1900  cefTRIAXone (ROCEPHIN) 1 g in sodium chloride 0.9 % 100 mL IVPB        1 g 200 mL/hr over 30 Minutes Intravenous  Once 11/26/23 1856 11/26/23 2038   11/26/23 1900  azithromycin (ZITHROMAX) 500 mg in sodium chloride 0.9 % 250 mL IVPB  Status:  Discontinued        500 mg 250 mL/hr over 60 Minutes Intravenous  Once 11/26/23 1856 11/26/23 1938        Medication:   acidophilus  2 capsule Oral TID   amiodarone  100 mg Oral Daily   amLODipine  5 mg Oral Daily   atorvastatin  80 mg Oral Daily   budesonide (PULMICORT) nebulizer solution  0.25 mg Nebulization BID   carvedilol  25 mg Oral BID   Chlorhexidine Gluconate Cloth  6 each Topical Daily   furosemide  40 mg Intravenous Q12H   guaiFENesin  10 mL Oral Q8H   insulin aspart  0-20 Units Subcutaneous TID WC   ipratropium  0.5 mg Nebulization TID   levalbuterol  1.25 mg Nebulization TID   levofloxacin  750 mg Oral Daily   methylPREDNISolone (SOLU-MEDROL) injection  40 mg Intravenous Q24H   oseltamivir  75 mg Oral  BID   pantoprazole (PROTONIX) IV  40 mg Intravenous Q12H   sodium chloride flush  3 mL Intravenous Q12H    acetaminophen **OR** acetaminophen, hydrALAZINE, levalbuterol, loperamide, oxyCODONE   Objective:   Vitals:   11/30/23 0630 11/30/23 0700 11/30/23 0742 11/30/23 0844  BP: (!) 167/57 (!) 188/58 (!) 170/62   Pulse: 64 69 64   Resp: 15 20 14    Temp:  97.8 F (36.6 C)    TempSrc:  Oral    SpO2: 97% 95% 97% (!) 88%  Weight:      Height:        Intake/Output Summary (Last 24 hours) at 11/30/2023 6295 Last data filed at 11/30/2023 0300 Gross per 24 hour  Intake 490 ml  Output 1000 ml  Net -510 ml   Filed Weights   11/26/23 1259  Weight: 83.5 kg     Physical examination:     General:  AAO x 3,  cooperative, no distress;   HEENT:  Normocephalic, PERRL, otherwise with in Normal limits   Neuro:  CNII-XII intact. , normal motor and sensation, reflexes intact   Lungs:   Clear to auscultation BL, Respirations unlabored,  Improved wheezes / mocrackles  Cardio:    S1/S2, RRR, No murmure, No Rubs or Gallops   Abdomen:  Soft, non-tender, bowel sounds active all four quadrants, no guarding or peritoneal signs.  Muscular  skeletal:  Limited exam -global generalized weaknesses - in bed, able to move all 4 extremities,   2+ pulses,  symmetric, No pitting edema  Skin:  Dry, warm to touch, negative for any Rashes,  Wounds: Please see nursing documentation           ------------------------------------------------------------------------------------------------------------------------------------------    LABs:     Latest Ref Rng & Units 11/30/2023    3:35 AM 11/29/2023    4:18 AM 11/28/2023    4:45 AM  CBC  WBC 4.0 - 10.5 K/uL 8.0  5.3  4.3   Hemoglobin 13.0 - 17.0 g/dL 8.4  8.2  8.1   Hematocrit 39.0 - 52.0 % 26.2  25.5  25.0   Platelets 150 - 400 K/uL 211  175  146       Latest Ref  Rng & Units 11/30/2023    3:35 AM 11/29/2023    4:18 AM 11/28/2023    5:07 PM  CMP   Glucose 70 - 99 mg/dL 409  811  914   BUN 8 - 23 mg/dL 18  13  12    Creatinine 0.61 - 1.24 mg/dL 7.82  9.56  2.13   Sodium 135 - 145 mmol/L 133  131  131   Potassium 3.5 - 5.1 mmol/L 3.7  3.4  3.8   Chloride 98 - 111 mmol/L 95  99  96   CO2 22 - 32 mmol/L 26  25  27    Calcium 8.9 - 10.3 mg/dL 8.6  8.2  8.0   Total Protein 6.5 - 8.1 g/dL 6.1  5.8    Total Bilirubin 0.0 - 1.2 mg/dL 0.7  0.7    Alkaline Phos 38 - 126 U/L 65  59    AST 15 - 41 U/L 29  39    ALT 0 - 44 U/L 43  51         Micro Results Recent Results (from the past 240 hours)  Resp panel by RT-PCR (RSV, Flu A&B, Covid) Anterior Nasal Swab     Status: Abnormal   Collection Time: 11/26/23 12:59 PM   Specimen: Anterior Nasal Swab  Result Value Ref Range Status   SARS Coronavirus 2 by RT PCR NEGATIVE NEGATIVE Final    Comment: (NOTE) SARS-CoV-2 target nucleic acids are NOT DETECTED.  The SARS-CoV-2 RNA is generally detectable in upper respiratory specimens during the acute phase of infection. The lowest concentration of SARS-CoV-2 viral copies this assay can detect is 138 copies/mL. A negative result does not preclude SARS-Cov-2 infection and should not be used as the sole basis for treatment or other patient management decisions. A negative result may occur with  improper specimen collection/handling, submission of specimen other than nasopharyngeal swab, presence of viral mutation(s) within the areas targeted by this assay, and inadequate number of viral copies(<138 copies/mL). A negative result must be combined with clinical observations, patient history, and epidemiological information. The expected result is Negative.  Fact Sheet for Patients:  BloggerCourse.com  Fact Sheet for Healthcare Providers:  SeriousBroker.it  This test is no t yet approved or cleared by the [place] FDA and  has been authorized for detection and/or diagnosis of SARS-CoV-2  by FDA under an Emergency Use Authorization (EUA). This EUA will remain  in effect (meaning this test can be used) for the duration of the COVID-19 declaration under Section 564(b)(1) of the Act, 21 U.S.C.section 360bbb-3(b)(1), unless the authorization is terminated  or revoked sooner.       Influenza A by PCR POSITIVE (A) NEGATIVE Final   Influenza B by PCR NEGATIVE NEGATIVE Final    Comment: (NOTE) The Xpert Xpress SARS-CoV-2/FLU/RSV plus assay is intended as an aid in the diagnosis of influenza from Nasopharyngeal swab specimens and should not be used as a sole basis for treatment. Nasal washings and aspirates are unacceptable for Xpert Xpress SARS-CoV-2/FLU/RSV testing.  Fact Sheet for Patients: BloggerCourse.com  Fact Sheet for Healthcare Providers: SeriousBroker.it  This test is not yet approved or cleared by the [place] FDA and has been authorized for detection and/or diagnosis of SARS-CoV-2 by FDA under an Emergency Use Authorization (EUA). This EUA will remain in effect (meaning this test can be used) for the duration of the COVID-19 declaration under Section 564(b)(1) of the Act, 21 U.S.C. section 360bbb-3(b)(1), unless the authorization is terminated or  revoked.     Resp Syncytial Virus by PCR NEGATIVE NEGATIVE Final    Comment: (NOTE) Fact Sheet for Patients: BloggerCourse.com  Fact Sheet for Healthcare Providers: SeriousBroker.it  This test is not yet approved or cleared by the [place] FDA and has been authorized for detection and/or diagnosis of SARS-CoV-2 by FDA under an Emergency Use Authorization (EUA). This EUA will remain in effect (meaning this test can be used) for the duration of the COVID-19 declaration under Section 564(b)(1) of the Act, 21 U.S.C. section 360bbb-3(b)(1), unless the authorization is terminated or revoked.  Performed at  [facility], [address], [place], [place] [address]   MRSA Next Gen by PCR, Nasal     Status: None   Collection Time: 11/27/23  8:00 AM   Specimen: Nasal Mucosa; Nasal Swab  Result Value Ref Range Status   MRSA by PCR Next Gen NOT DETECTED NOT DETECTED Final    Comment: (NOTE) The GeneXpert MRSA Assay (FDA approved for NASAL specimens only), is one component of a comprehensive MRSA colonization surveillance program. It is not intended to diagnose MRSA infection nor to guide or monitor treatment for MRSA infections. Test performance is not FDA approved in patients less than 89 years old. Performed at [facility], [address] [place], [place] [address]     Radiology Reports DG CHEST PORT 1 VIEW Result Date: 11/29/2023 CLINICAL DATA:  Shortness of breath. EXAM: PORTABLE CHEST 1 VIEW COMPARISON:  X-ray 11/27/2023 and older FINDINGS: Enlarged cardiopericardial silhouette with calcified tortuous aorta. Diffuse interstitial changes are again seen. Tiny pleural effusions. More focal left retrocardiac opacity also identified. Apical pleural thickening calcifications. No pneumothorax. Overlapping cardiac leads. Degenerative changes along the spine. Old, healed left clavicle fracture. IMPRESSION: No significant oval change when adjusted for technique. Electronically Signed   By: Karen Kays M.D.   On: 11/29/2023 17:15    SIGNED: Kendell Bane, MD, FHM. FAAFP. [facility] - [facility] Critical care time spent - 55 min.  In seeing, evaluating and examining the patient. Reviewing medical records, labs, drawn plan of care. [facility],  Pager (please use [email or web address] to page/ text) Please use Epic Secure Chat for non-urgent communication (7AM-7PM)  If 7PM-7AM, please contact night-coverage [email or web address], 11/30/2023, 9:28 AM

## 2023-11-30 NOTE — Transfer of Care (Signed)
 Immediate Anesthesia Transfer of Care Note  Patient: Connor Bryant  Procedure(s) Performed: EGD (ESOPHAGOGASTRODUODENOSCOPY)  Patient Location: PACU  Anesthesia Type:General  Level of Consciousness: awake and alert   Airway & Oxygen Therapy: Patient Spontanous Breathing and Patient connected to face mask oxygen  Post-op Assessment: Report given to RN and Post -op Vital signs reviewed and stable  Post vital signs: Reviewed and stable  Last Vitals:  Vitals Value Taken Time  BP 121/72   Temp 98   Pulse 59 11/30/23 1014  Resp 12 11/30/23 1014  SpO2 100 % 11/30/23 1014  Vitals shown include unfiled device data.  Last Pain:  Vitals:   11/30/23 0700  TempSrc: Oral  PainSc:       Patients Stated Pain Goal: 0 (11/29/23 2000)  Complications: No notable events documented.

## 2023-11-30 NOTE — Progress Notes (Signed)

## 2023-11-30 NOTE — Consult Note (Signed)
 Chart reviewed per Jeani Hawking Attending request.  Between Eliquis for a. fibrillation and Plavix for complex PCI less than 3 months ago, in patient with GI bleed and significant anemia, if GI clears patient after EGD/Colonoscopy, I would prefer 75 mg. Plavix daily with Protonix use over Eliquis, Aspirin, etc.  May contact [facility] Cardiology at any time.  Orpah Cobb, MD/Cardiology 11/30/2022-9:31 AM

## 2023-11-30 NOTE — Op Note (Signed)
 [facility] Patient Name: Connor Bryant Procedure Date: 11/30/2023 9:46 AM MRN: [id number] Date of Birth: 1946/09/12 Attending MD: Katrinka Blazing , , [id number] CSN: [id number] Age: 78 Admit Type: Inpatient Procedure:                Upper GI endoscopy Indications:              Anemia, positive FOBT Providers:                Katrinka Blazing, Nena Polio, RN, Cyril Mourning, Technician Referring MD:              Medicines:                Monitored Anesthesia Care Complications:            No immediate complications. Estimated Blood Loss:     Estimated blood loss: none. Procedure:                Pre-Anesthesia Assessment:                           - Prior to the procedure, a History and Physical                            was performed, and patient medications, allergies                            and sensitivities were reviewed. The patient's                            tolerance of previous anesthesia was reviewed.                           - The risks and benefits of the procedure and the                            sedation options and risks were discussed with the                            patient. All questions were answered and informed                            consent was obtained.                           - ASA Grade Assessment: III - A patient with severe                            systemic disease.                           After obtaining informed consent, the endoscope was                            passed under direct vision. Throughout the  procedure, the patient's blood pressure, pulse, and                            oxygen saturations were monitored continuously. The                            [id number]-[id number] ([id number]) scope was introduced through the                            mouth, and advanced to the second part of duodenum.                            The upper GI endoscopy was somewhat difficult due                             to presence of food. The patient tolerated the                            procedure well. Scope In: 10:05:05 AM Scope Out: 10:07:20 AM Total Procedure Duration: 0 hours 2 minutes 15 seconds  Findings:      Small amount of food was found in the lower third of the esophagus. This       was pushed into the stomach.      A 1 cm hiatal hernia was present.      A small amount of food (residue) was found in the gastric body. This was       moved to the side to inspect the stomach. Upon careful inspection, no       presence of hematin or active bleeding was present.      The examined duodenum was normal. Impression:               - Small amount of food in the lower third of the                            esophagus.                           - 1 cm hiatal hernia.                           - A small amount of food (residue) in the stomach.                           - Normal examined duodenum.                           - No specimens collected. Moderate Sedation:      Per Anesthesia Care Recommendation:           - Return patient to hospital ward for ongoing care.                           - Resume previous diet.                           -  Follow up with Dr. Leone Payor in GI clinic.                           - Ok to restart anticoagulation, may need to                            evaluate other reasons for anemia given normal iron                            stores and no clinical bleeding. Procedure Code(s):        --- Professional ---                           [phone], Esophagogastroduodenoscopy, flexible,                            transoral; diagnostic, including collection of                            specimen(s) by brushing or washing, when performed                            (separate procedure) Diagnosis Code(s):        --- Professional ---                           U04.540J, Food in esophagus causing other injury,                            initial encounter                            K44.9, Diaphragmatic hernia without obstruction or                            gangrene                           D64.9, Anemia, unspecified CPT copyright 2022 American Medical Association. All rights reserved. The codes documented in this report are preliminary and upon coder review may  be revised to meet current compliance requirements. Katrinka Blazing, MD Katrinka Blazing,  11/30/2023 10:17:02 AM This report has been signed electronically. Number of Addenda: 0

## 2023-12-01 DIAGNOSIS — K922 Gastrointestinal hemorrhage, unspecified: Secondary | ICD-10-CM | POA: Diagnosis not present

## 2023-12-01 LAB — IRON AND TIBC
Iron: 53 ug/dL (ref 45–182)
Saturation Ratios: 25 % (ref 17.9–39.5)
TIBC: 211 ug/dL — ABNORMAL LOW (ref 250–450)
UIBC: 158 ug/dL

## 2023-12-01 LAB — COMPREHENSIVE METABOLIC PANEL
ALT: 36 U/L (ref 0–44)
AST: 21 U/L (ref 15–41)
Albumin: 2.6 g/dL — ABNORMAL LOW (ref 3.5–5.0)
Alkaline Phosphatase: 58 U/L (ref 38–126)
Anion gap: 6 (ref 5–15)
BUN: 23 mg/dL (ref 8–23)
CO2: 30 mmol/L (ref 22–32)
Calcium: 8.3 mg/dL — ABNORMAL LOW (ref 8.9–10.3)
Chloride: 97 mmol/L — ABNORMAL LOW (ref 98–111)
Creatinine, Ser: 1.01 mg/dL (ref 0.61–1.24)
GFR, Estimated: >60 mL/min (ref >60)
Glucose, Bld: 208 mg/dL — ABNORMAL HIGH (ref 70–99)
Potassium: 3.6 mmol/L (ref 3.5–5.1)
Sodium: 133 mmol/L — ABNORMAL LOW (ref 135–145)
Total Bilirubin: 0.7 mg/dL (ref 0.0–1.2)
Total Protein: 5.6 g/dL — ABNORMAL LOW (ref 6.5–8.1)

## 2023-12-01 LAB — CBC WITH DIFFERENTIAL/PLATELET
Abs Immature Granulocytes: 0.06 10*3/uL (ref 0.00–0.07)
Basophils Absolute: 0 10*3/uL (ref 0.0–0.1)
Basophils Relative: 0 %
Eosinophils Absolute: 0 10*3/uL (ref 0.0–0.5)
Eosinophils Relative: 0 %
HCT: 25 % — ABNORMAL LOW (ref 39.0–52.0)
Hemoglobin: 8.2 g/dL — ABNORMAL LOW (ref 13.0–17.0)
Immature Granulocytes: 1 %
Lymphocytes Relative: 6 %
Lymphs Abs: 0.4 10*3/uL — ABNORMAL LOW (ref 0.7–4.0)
MCH: 29.3 pg (ref 26.0–34.0)
MCHC: 32.8 g/dL (ref 30.0–36.0)
MCV: 89.3 fL (ref 80.0–100.0)
Monocytes Absolute: 0.2 10*3/uL (ref 0.1–1.0)
Monocytes Relative: 2 %
Neutro Abs: 6.1 10*3/uL (ref 1.7–7.7)
Neutrophils Relative %: 91 %
Platelets: 217 10*3/uL (ref 150–400)
RBC: 2.8 MIL/uL — ABNORMAL LOW (ref 4.22–5.81)
RDW: 16.2 % — ABNORMAL HIGH (ref 11.5–15.5)
WBC: 6.7 10*3/uL (ref 4.0–10.5)
nRBC: 0 % (ref 0.0–0.2)

## 2023-12-01 LAB — GLUCOSE, CAPILLARY
Glucose-Capillary: 214 mg/dL — ABNORMAL HIGH (ref 70–99)
Glucose-Capillary: 254 mg/dL — ABNORMAL HIGH (ref 70–99)
Glucose-Capillary: 179 mg/dL — ABNORMAL HIGH (ref 70–99)
Glucose-Capillary: 170 mg/dL — ABNORMAL HIGH (ref 70–99)

## 2023-12-01 LAB — MAGNESIUM: Magnesium: 1.6 mg/dL — ABNORMAL LOW (ref 1.7–2.4)

## 2023-12-01 LAB — PHOSPHORUS: Phosphorus: 3.6 mg/dL (ref 2.5–4.6)

## 2023-12-01 MED ORDER — PREDNISONE 20 MG PO TABS
50.0000 mg | ORAL_TABLET | Freq: Every day | ORAL | Status: DC
  Administered 2023-12-01 – 2023-12-02 (×2): 50 mg via ORAL
  Filled 2023-12-01: qty 1
  Filled 2023-12-01: qty 5

## 2023-12-01 MED ORDER — POTASSIUM CHLORIDE CRYS ER 20 MEQ PO TBCR
20.0000 meq | EXTENDED_RELEASE_TABLET | Freq: Every day | ORAL | Status: DC
  Administered 2023-12-01 – 2023-12-02 (×2): 20 meq via ORAL
  Filled 2023-12-01 (×2): qty 1

## 2023-12-01 MED ORDER — MAGNESIUM SULFATE 2 GM/50ML IV SOLN
2.0000 g | Freq: Once | INTRAVENOUS | Status: AC
  Administered 2023-12-01: 2 g via INTRAVENOUS
  Filled 2023-12-01: qty 50

## 2023-12-01 MED ORDER — DILTIAZEM HCL ER COATED BEADS 120 MG PO CP24
120.0000 mg | ORAL_CAPSULE | Freq: Every day | ORAL | Status: DC
Start: 2023-12-01 — End: 2023-12-01

## 2023-12-01 MED ORDER — TORSEMIDE 20 MG PO TABS
40.0000 mg | ORAL_TABLET | Freq: Every day | ORAL | Status: DC
  Administered 2023-12-01 – 2023-12-02 (×2): 40 mg via ORAL
  Filled 2023-12-01 (×2): qty 2

## 2023-12-01 MED ORDER — DILTIAZEM HCL ER COATED BEADS 180 MG PO CP24
180.0000 mg | ORAL_CAPSULE | Freq: Every day | ORAL | Status: DC
  Administered 2023-12-01 – 2023-12-02 (×2): 180 mg via ORAL
  Filled 2023-12-01 (×2): qty 1

## 2023-12-01 NOTE — Progress Notes (Signed)
 PROGRESS NOTE    Patient: Connor Bryant                            PCP: Mechele Claude, MD                    DOB: [birth year]            DOA: 11/26/2023 ZOX:[id number]             DOS: 12/01/2023, 10:53 AM   LOS: 5 days   Date of Service: The patient was seen and examined on 12/01/2023  Subjective:   The patient was seen and examined this morning, stable no acute distress Has been successfully weaned off supplemental oxygen, currently satting 98% room air Blood pressure still elevated this morning 162/73, heart rate 63  Still clear and adamant that he would not take Plavix due to bleeding, and nosebleeds.  Brief Narrative:   Connor Bryant is 78 y.o. male with diabetes, hypertension, hyperlipidemia, CAD, chronic heart failure with reduced EF, paroxysmal A-fib on Eliquis, presents with generalized weakness, cough, shortness of breath.  He denies any blood in his stool but does endorse that it has been darker than normal.  He has also recently had bleeding from his nose and mouth and discontinued his Plavix for the last month.  In the   ED patient was found to be saturating the low 90s on room air, EKG reportedly revealed sinus bradycardia with first-degree AV block, PACs, incomplete RBBB and LAFB.   Chest x-ray revealed prominent bilateral interstitial opacities with retrocardiac opacity.   Labs reveal influenza A positive, positive FOBT, sodium 126, potassium 2.7, hemoglobin 9.7, troponin 161.   Gastroenterology Dr. Levon Hedger was consulted by EDP.  Patient was initiated on antibiotics, IV Protonix, IV potassium, IV fluids.     Assessment & Plan:   Principal Problem:   GI bleeding Active Problems:   Type 2 diabetes mellitus without complication, without long-term current use of insulin (HCC)   Elevated troponin   Rheumatoid arthritis involving multiple sites with positive rheumatoid factor (HCC)   Symptomatic anemia   Paroxysmal atrial fibrillation (HCC)   HFrEF (heart failure with  reduced ejection fraction) (HCC)   Coronary artery disease involving native coronary artery of native heart without angina pectoris   Hyponatremia   Hypokalemia   Influenza A   Melena   Acute hypoxic respiratory failure -Much improved, has been weaned off all supplemental oxygen, currently satting 90% on room air Acute worsening this AM--off BiPAP, could not tolerate 6 L of oxygen, satting 98%>>> Down to 2 L, >>> to room air satting 98% - Secondary to influenza pneumonia  - Reviewed chest x-ray-edema possible pneumonia  -Will continue with breathing treatment with Atrovent/Xopenex,  IV steroids -tapering down   Influenza A- Pneumonia - Influenza A PCR positive - Continue empiric antibiotics for now. On Doxy and Ceft.>>  Patient to p.o. Levaquin Completed course of antibiotics - Strep pneumo and Legionella antigens -were negative - Continue Tamiflu -completed treatment    GI bleed, melena, FOBT +, w Acute on chronic anemia Hemoglobin 9.4>>  8.4 >> 8.2  - Takes Eliquis and Plavix at home.   -Patient stopped taking his Plavix on his own ... Due to bleeding, nosebleeding Adamant that he will not resume Plavix despite cardiology recommendation   Stopped Plavix 1 month ago due to epistaxis. Hold Eliquis for now as well.    - GI  consulted:  - Continue with PPI for now - 11/30/2023, planning for EGD -no active site of bleeding, okay to resume anticoagulant  Hyponatremia - Sodium 126 on arrival >>128 >>>131, 133  - Status post 1 L NS in ED Will continue to monitor   Hypokalemia - Continue IV replacement and PO (sodium 2.6 >>3.4, 3.7, 3.6 ) -Monitoring potassium and magnesium level and repleting accordingly - Continue to trending    Chronic heart failure with reduced EF -Much improved lower extremity edema, much improved shortness of breath -Discontinued IV Lasix, switch back to p.o. torsemide- 3/92005  - Echo as of 08/2023: LVEF 30 to 35%, mild left ventricular hypertrophy,  global hypokinesis, left atrial dilation, right atrial dilation, mild MVR, moderate AVS. -With history of PCI and stenting - Continue carvedilol for now - Continue to monitor volume status closely.  BNP 858 >>> 2033.0  -Start IV Lasix 40 mg daily>> switch to p.o. torsemide  -Cardiology was consulted at [facility], recommended to continue Plavix, patient is refusing due to multiple episodes of nosebleed--okay to restart Eliquis  -Due to progressive respiratory distress, lung issues, patient is amiodarone was switched to diltiazem    Paroxysmal Afib  - Holding Eliquis as above - Continue amiodarone and Coreg - (dosing dose of amiodarone -due to respiratory distress to reduce chance of toxicity)    CAD - Elevated Troponin - History of DES to LAD in December 2024 -- Trop 161 --> 148 - No chest pain.  Troponin elevation likely demand ischemia in setting of acute on chronic anemia and hypoxia - Continue Lipitor and Coreg -Amiodarone switched to diltiazem   Type 2 diabetes - Hemoglobin A1c 6.6% in November 2024 - Low intensity sliding scale insulin if needed    Hypertension -BP, initiating as needed IV hydralazine, Home medication of Coreg increased dose, -Discontinue Norvasc switch to diltiazem 12/01/23    Transferring out of ICU today 12/01/2023 ----------------------------------------------------------------------------------------------------------------------------------------------- Nutritional status:  The patient's BMI is: Body mass index is 27.98 kg/m. I agree with the assessment and plan as outlined --------------------------------------------------------------------------------------------------------------------------------------- Cultures; Blood Cultures x 2 >> NGTD Sputum Culture >> NGTD   ------------------------------------------------------------------------------------------------------------------------------------------------  DVT prophylaxis:  apixaban (ELIQUIS)  tablet 2.5 mg Start: 12/01/23 1000 Place and maintain sequential compression device Start: 11/29/23 0634 SCDs Start: 11/26/23 1936 apixaban (ELIQUIS) tablet 2.5 mg   Code Status:   Code Status: Full Code  Family Communication: No family member present at bedside-  -Advance care planning has been discussed.   Admission status:   Status is: Inpatient Remains inpatient appropriate because: Needing respiratory support, BiPAP in ICU setting, IV antibiotics -monitoring for GI bleed   Disposition: From  - home             Planning to discharge home with home health.   Procedures:   No admission procedures for hospital encounter.   Antimicrobials:  Anti-infectives (From admission, onward)    Start     Dose/Rate Route Frequency Ordered Stop   11/29/23 1330  levofloxacin (LEVAQUIN) tablet 750 mg        750 mg Oral Daily 11/29/23 1235     11/27/23 2200  oseltamivir (TAMIFLU) capsule 75 mg        75 mg Oral 2 times daily 11/27/23 1333 12/01/23 0836   11/27/23 1900  cefTRIAXone (ROCEPHIN) 2 g in sodium chloride 0.9 % 100 mL IVPB  Status:  Discontinued        2 g 200 mL/hr over 30 Minutes Intravenous Every 24 hours 11/26/23 1938  11/29/23 1235   11/27/23 1000  oseltamivir (TAMIFLU) capsule 30 mg  Status:  Discontinued        30 mg Oral 2 times daily 11/26/23 1938 11/27/23 1333   11/26/23 2000  doxycycline (VIBRAMYCIN) 100 mg in sodium chloride 0.9 % 250 mL IVPB  Status:  Discontinued        100 mg 125 mL/hr over 120 Minutes Intravenous Every 12 hours 11/26/23 1938 11/29/23 1235   11/26/23 1945  oseltamivir (TAMIFLU) capsule 75 mg        75 mg Oral  Once 11/26/23 1943 11/26/23 2245   11/26/23 1900  cefTRIAXone (ROCEPHIN) 1 g in sodium chloride 0.9 % 100 mL IVPB        1 g 200 mL/hr over 30 Minutes Intravenous  Once 11/26/23 1856 11/26/23 2038   11/26/23 1900  azithromycin (ZITHROMAX) 500 mg in sodium chloride 0.9 % 250 mL IVPB  Status:  Discontinued        500 mg 250 mL/hr over 60  Minutes Intravenous  Once 11/26/23 1856 11/26/23 1938        Medication:   acidophilus  2 capsule Oral TID   apixaban  2.5 mg Oral BID   atorvastatin  80 mg Oral Daily   budesonide (PULMICORT) nebulizer solution  0.25 mg Nebulization BID   carvedilol  25 mg Oral BID   Chlorhexidine Gluconate Cloth  6 each Topical Daily   clopidogrel  75 mg Oral Daily   diltiazem  180 mg Oral Daily   guaiFENesin  10 mL Oral Q8H   insulin aspart  0-20 Units Subcutaneous TID WC   ipratropium  0.5 mg Nebulization TID   levalbuterol  1.25 mg Nebulization TID   levofloxacin  750 mg Oral Daily   pantoprazole  40 mg Oral BID   potassium chloride  20 mEq Oral Daily   predniSONE  50 mg Oral Q breakfast   sodium chloride flush  3 mL Intravenous Q12H   torsemide  40 mg Oral Daily    acetaminophen **OR** acetaminophen, hydrALAZINE, levalbuterol, loperamide, oxyCODONE   Objective:   Vitals:   12/01/23 0800 12/01/23 0900 12/01/23 1000 12/01/23 1036  BP: (!) 166/54 (!) 158/47 (!) 136/46 (!) 162/73  Pulse: 69 72 61 63  Resp: 18 15 18 19   Temp:    98.1 F (36.7 C)  TempSrc:    Oral  SpO2: 94% 96% 95% 98%  Weight:      Height:        Intake/Output Summary (Last 24 hours) at 12/01/2023 1053 Last data filed at 12/01/2023 1000 Gross per 24 hour  Intake 530 ml  Output 2250 ml  Net -1720 ml   Filed Weights   11/26/23 1259  Weight: 83.5 kg     Physical examination:   General:  AAO x 3,  cooperative, no distress;   HEENT:  Normocephalic, PERRL, otherwise with in Normal limits   Neuro:  CNII-XII intact. , normal motor and sensation, reflexes intact   Lungs:   Clear to auscultation BL, Respirations unlabored,  No wheezes / crackles  Cardio:    S1/S2, RRR, No murmure, No Rubs or Gallops   Abdomen:  Soft, non-tender, bowel sounds active all four quadrants, no guarding or peritoneal signs.  Muscular  skeletal:  Limited exam -global generalized weaknesses - in bed, able to move all 4 extremities,    2+ pulses,  symmetric, No pitting edema  Skin:  Dry, warm to touch, negative for any Rashes,  Wounds: Please see nursing documentation    ------------------------------------------------------------------------------------------------------------------------------------------    LABs:     Latest Ref Rng & Units 12/01/2023    3:57 AM 11/30/2023    3:35 AM 11/29/2023    4:18 AM  CBC  WBC 4.0 - 10.5 K/uL 6.7  8.0  5.3   Hemoglobin 13.0 - 17.0 g/dL 8.2  8.4  8.2   Hematocrit 39.0 - 52.0 % 25.0  26.2  25.5   Platelets 150 - 400 K/uL 217  211  175       Latest Ref Rng & Units 12/01/2023    3:57 AM 11/30/2023    3:35 AM 11/29/2023    4:18 AM  CMP  Glucose 70 - 99 mg/dL 409  811  914   BUN 8 - 23 mg/dL 23  18  13    Creatinine 0.61 - 1.24 mg/dL 7.82  9.56  2.13   Sodium 135 - 145 mmol/L 133  133  131   Potassium 3.5 - 5.1 mmol/L 3.6  3.7  3.4   Chloride 98 - 111 mmol/L 97  95  99   CO2 22 - 32 mmol/L 30  26  25    Calcium 8.9 - 10.3 mg/dL 8.3  8.6  8.2   Total Protein 6.5 - 8.1 g/dL 5.6  6.1  5.8   Total Bilirubin 0.0 - 1.2 mg/dL 0.7  0.7  0.7   Alkaline Phos 38 - 126 U/L 58  65  59   AST 15 - 41 U/L 21  29  39   ALT 0 - 44 U/L 36  43  51        Micro Results Recent Results (from the past 240 hours)  Resp panel by RT-PCR (RSV, Flu A&B, Covid) Anterior Nasal Swab     Status: Abnormal   Collection Time: 11/26/23 12:59 PM   Specimen: Anterior Nasal Swab  Result Value Ref Range Status   SARS Coronavirus 2 by RT PCR NEGATIVE NEGATIVE Final    Comment: (NOTE) SARS-CoV-2 target nucleic acids are NOT DETECTED.  The SARS-CoV-2 RNA is generally detectable in upper respiratory specimens during the acute phase of infection. The lowest concentration of SARS-CoV-2 viral copies this assay can detect is 138 copies/mL. A negative result does not preclude SARS-Cov-2 infection and should not be used as the sole basis for treatment or other patient management decisions. A negative result may occur  with  improper specimen collection/handling, submission of specimen other than nasopharyngeal swab, presence of viral mutation(s) within the areas targeted by this assay, and inadequate number of viral copies(<138 copies/mL). A negative result must be combined with clinical observations, patient history, and epidemiological information. The expected result is Negative.  Fact Sheet for Patients:  BloggerCourse.com  Fact Sheet for Healthcare Providers:  SeriousBroker.it  This test is no t yet approved or cleared by the [place] FDA and  has been authorized for detection and/or diagnosis of SARS-CoV-2 by FDA under an Emergency Use Authorization (EUA). This EUA will remain  in effect (meaning this test can be used) for the duration of the COVID-19 declaration under Section 564(b)(1) of the Act, 21 U.S.C.section 360bbb-3(b)(1), unless the authorization is terminated  or revoked sooner.       Influenza A by PCR POSITIVE (A) NEGATIVE Final   Influenza B by PCR NEGATIVE NEGATIVE Final    Comment: (NOTE) The Xpert Xpress SARS-CoV-2/FLU/RSV plus assay is intended as an aid in the diagnosis of influenza from Nasopharyngeal swab specimens and  should not be used as a sole basis for treatment. Nasal washings and aspirates are unacceptable for Xpert Xpress SARS-CoV-2/FLU/RSV testing.  Fact Sheet for Patients: BloggerCourse.com  Fact Sheet for Healthcare Providers: SeriousBroker.it  This test is not yet approved or cleared by the [place] FDA and has been authorized for detection and/or diagnosis of SARS-CoV-2 by FDA under an Emergency Use Authorization (EUA). This EUA will remain in effect (meaning this test can be used) for the duration of the COVID-19 declaration under Section 564(b)(1) of the Act, 21 U.S.C. section 360bbb-3(b)(1), unless the authorization is terminated  or revoked.     Resp Syncytial Virus by PCR NEGATIVE NEGATIVE Final    Comment: (NOTE) Fact Sheet for Patients: BloggerCourse.com  Fact Sheet for Healthcare Providers: SeriousBroker.it  This test is not yet approved or cleared by the [place] FDA and has been authorized for detection and/or diagnosis of SARS-CoV-2 by FDA under an Emergency Use Authorization (EUA). This EUA will remain in effect (meaning this test can be used) for the duration of the COVID-19 declaration under Section 564(b)(1) of the Act, 21 U.S.C. section 360bbb-3(b)(1), unless the authorization is terminated or revoked.  Performed at [facility], [address], [place], [place] [address]   MRSA Next Gen by PCR, Nasal     Status: None   Collection Time: 11/27/23  8:00 AM   Specimen: Nasal Mucosa; Nasal Swab  Result Value Ref Range Status   MRSA by PCR Next Gen NOT DETECTED NOT DETECTED Final    Comment: (NOTE) The GeneXpert MRSA Assay (FDA approved for NASAL specimens only), is one component of a comprehensive MRSA colonization surveillance program. It is not intended to diagnose MRSA infection nor to guide or monitor treatment for MRSA infections. Test performance is not FDA approved in patients less than 18 years old. Performed at [facility], [address] [place], [place] [address]     Radiology Reports No results found.   SIGNED: Kendell Bane, MD, FHM. FAAFP. [facility] - [facility] time spent - 55 min.  In seeing, evaluating and examining the patient. Reviewing medical records, labs, drawn plan of care. [facility],  Pager (please use [email or web address] to page/ text) Please use Epic Secure Chat for non-urgent communication (7AM-7PM)  If 7PM-7AM, please contact night-coverage [email or web address], 12/01/2023, 10:53 AM

## 2023-12-02 ENCOUNTER — Encounter (HOSPITAL_COMMUNITY): Payer: Self-pay | Admitting: Gastroenterology

## 2023-12-02 DIAGNOSIS — E876 Hypokalemia: Secondary | ICD-10-CM | POA: Diagnosis not present

## 2023-12-02 DIAGNOSIS — I5020 Unspecified systolic (congestive) heart failure: Secondary | ICD-10-CM | POA: Diagnosis not present

## 2023-12-02 DIAGNOSIS — I480 Paroxysmal atrial fibrillation: Secondary | ICD-10-CM | POA: Diagnosis not present

## 2023-12-02 DIAGNOSIS — J101 Influenza due to other identified influenza virus with other respiratory manifestations: Secondary | ICD-10-CM | POA: Diagnosis not present

## 2023-12-02 LAB — CBC WITH DIFFERENTIAL/PLATELET
Abs Immature Granulocytes: 0.06 10*3/uL (ref 0.00–0.07)
Basophils Absolute: 0 10*3/uL (ref 0.0–0.1)
Basophils Relative: 0 %
Eosinophils Absolute: 0 10*3/uL (ref 0.0–0.5)
Eosinophils Relative: 0 %
HCT: 26.4 % — ABNORMAL LOW (ref 39.0–52.0)
Hemoglobin: 8.6 g/dL — ABNORMAL LOW (ref 13.0–17.0)
Immature Granulocytes: 1 %
Lymphocytes Relative: 11 %
Lymphs Abs: 0.7 10*3/uL (ref 0.7–4.0)
MCH: 29.1 pg (ref 26.0–34.0)
MCHC: 32.6 g/dL (ref 30.0–36.0)
MCV: 89.2 fL (ref 80.0–100.0)
Monocytes Absolute: 0.5 10*3/uL (ref 0.1–1.0)
Monocytes Relative: 7 %
Neutro Abs: 5.5 10*3/uL (ref 1.7–7.7)
Neutrophils Relative %: 81 %
Platelets: 261 10*3/uL (ref 150–400)
RBC: 2.96 MIL/uL — ABNORMAL LOW (ref 4.22–5.81)
RDW: 16.3 % — ABNORMAL HIGH (ref 11.5–15.5)
WBC: 6.8 10*3/uL (ref 4.0–10.5)
nRBC: 0 % (ref 0.0–0.2)

## 2023-12-02 LAB — COMPREHENSIVE METABOLIC PANEL
ALT: 29 U/L (ref 0–44)
AST: 17 U/L (ref 15–41)
Albumin: 2.7 g/dL — ABNORMAL LOW (ref 3.5–5.0)
Alkaline Phosphatase: 57 U/L (ref 38–126)
Anion gap: 10 (ref 5–15)
BUN: 28 mg/dL — ABNORMAL HIGH (ref 8–23)
CO2: 31 mmol/L (ref 22–32)
Calcium: 8.7 mg/dL — ABNORMAL LOW (ref 8.9–10.3)
Chloride: 94 mmol/L — ABNORMAL LOW (ref 98–111)
Creatinine, Ser: 1.18 mg/dL (ref 0.61–1.24)
GFR, Estimated: >60 mL/min (ref >60)
Glucose, Bld: 179 mg/dL — ABNORMAL HIGH (ref 70–99)
Potassium: 3.6 mmol/L (ref 3.5–5.1)
Sodium: 135 mmol/L (ref 135–145)
Total Bilirubin: 0.9 mg/dL (ref 0.0–1.2)
Total Protein: 5.8 g/dL — ABNORMAL LOW (ref 6.5–8.1)

## 2023-12-02 LAB — GLUCOSE, CAPILLARY
Glucose-Capillary: 189 mg/dL — ABNORMAL HIGH (ref 70–99)
Glucose-Capillary: 168 mg/dL — ABNORMAL HIGH (ref 70–99)

## 2023-12-02 LAB — MAGNESIUM: Magnesium: 1.8 mg/dL (ref 1.7–2.4)

## 2023-12-02 LAB — PHOSPHORUS: Phosphorus: 4.4 mg/dL (ref 2.5–4.6)

## 2023-12-02 MED ORDER — DILTIAZEM HCL ER COATED BEADS 180 MG PO CP24: 180.0000 mg | ORAL_CAPSULE | Freq: Every day | ORAL | 0 refills | Status: DC

## 2023-12-02 MED ORDER — LEVALBUTEROL HCL 1.25 MG/0.5ML IN NEBU: 1.2500 mg | INHALATION_SOLUTION | Freq: Two times a day (BID) | RESPIRATORY_TRACT | Status: DC

## 2023-12-02 MED ORDER — CARVEDILOL 25 MG PO TABS: 25.0000 mg | ORAL_TABLET | Freq: Two times a day (BID) | ORAL | 0 refills | Status: DC

## 2023-12-02 MED ORDER — TORSEMIDE 40 MG PO TABS: 40.0000 mg | ORAL_TABLET | Freq: Every day | ORAL | 0 refills | Status: DC

## 2023-12-02 MED ORDER — APIXABAN 2.5 MG PO TABS: 2.5000 mg | ORAL_TABLET | Freq: Two times a day (BID) | ORAL | Status: DC

## 2023-12-02 MED ORDER — IPRATROPIUM BROMIDE 0.02 % IN SOLN
0.5000 mg | Freq: Two times a day (BID) | RESPIRATORY_TRACT | Status: DC
Start: 2023-12-02 — End: 2023-12-02

## 2023-12-02 MED ORDER — POTASSIUM CHLORIDE CRYS ER 20 MEQ PO TBCR: 20.0000 meq | EXTENDED_RELEASE_TABLET | Freq: Every day | ORAL | 0 refills | Status: DC

## 2023-12-02 NOTE — Progress Notes (Signed)
 Patient discharged home with instructions given on medications and follow up visits,verbalized understanding.Prescriptions sent to Pharmacy of choice documented on AVS. IV discontinued,catheter intact. Staff to accompany patient to awaiting vehicle.

## 2023-12-02 NOTE — TOC Transition Note (Signed)
 Transition of Care [facility]) - Discharge Note   Patient Details  Name: Connor Bryant MRN: [id number] Date of Birth: 1946/06/20  [facility] CM/SW Contact:  Beather Arbour Phone Number: 12/02/2023, 10:22 AM   Clinical Narrative:    Patient is discharging today back home. MD updated on the need for resumption orders for HHPT. Jennifer with [facility] updated on pt DC today and orders being placed. TOC signing off.    Final next level of care: Home w Home Health Services Barriers to Discharge: Barriers Resolved   Patient Goals and CMS Choice Patient states their goals for this hospitalization and ongoing recovery are:: return back home CMS Medicare.gov Compare Post Acute Care list provided to:: Patient Choice offered to / list presented to : Patient [facility] ownership interest in [facility].provided to::  (n/a)    Discharge Placement  Home               Patient to be transferred to facility by: POV Name of family member notified: Timber Patient and family notified of of transfer: 12/02/23  Discharge Plan and Services Additional resources added to the After Visit Summary for   In-house Referral: Clinical Social Work              DME Arranged: N/A DME Agency: [facility]       HH Arranged: PT HH Agency: [facility] Date HH Agency Contacted: 12/02/23 Time HH Agency Contacted: 1022 Representative spoke with at [facility] Agency: Victorino Dike  Social Drivers of Health (SDOH) Interventions SDOH Screenings   Food Insecurity: No Food Insecurity (11/26/2023)  Housing: Low Risk  (11/26/2023)  Transportation Needs: No Transportation Needs (11/26/2023)  Utilities: Not At Risk (11/26/2023)  Alcohol Screen: Low Risk  (08/19/2023)  Depression (PHQ2-9): Low Risk  (10/21/2023)  Recent Concern: Depression (PHQ2-9) - Medium Risk (10/15/2023)  Financial Resource Strain: Low Risk  (08/19/2023)  Physical Activity: Insufficiently Active (12/03/2022)  Social  Connections: Moderately Isolated (11/27/2023)  Stress: No Stress Concern Present (12/03/2022)  Tobacco Use: Medium Risk (11/30/2023)     Readmission Risk Interventions    12/02/2023   10:06 AM 11/29/2023   10:09 AM 11/27/2023    8:43 AM  Readmission Risk Prevention Plan  Transportation Screening Complete Complete Complete  HRI or Home Care Consult  Complete Complete  Social Work Consult for Recovery Care Planning/Counseling  Complete Complete  Palliative Care Screening  Not Applicable Not Applicable  Medication Review Oceanographer) Complete Complete Complete  HRI or Home Care Consult Complete    SW Recovery Care/Counseling Consult Complete    Palliative Care Screening Not Applicable    Skilled Nursing Facility Not Applicable

## 2023-12-02 NOTE — Plan of Care (Signed)
  Problem: Education: Goal: Ability to describe self-care measures that may prevent or decrease complications (Diabetes Survival Skills Education) will improve Outcome: Progressing   Problem: Coping: Goal: Ability to adjust to condition or change in health will improve Outcome: Progressing   Problem: Fluid Volume: Goal: Ability to maintain a balanced intake and output will improve Outcome: Progressing   Problem: Health Behavior/Discharge Planning: Goal: Ability to manage health-related needs will improve Outcome: Progressing   Problem: Metabolic: Goal: Ability to maintain appropriate glucose levels will improve Outcome: Progressing   Problem: Nutritional: Goal: Maintenance of adequate nutrition will improve Outcome: Progressing   Problem: Skin Integrity: Goal: Risk for impaired skin integrity will decrease Outcome: Progressing   Problem: Tissue Perfusion: Goal: Adequacy of tissue perfusion will improve Outcome: Progressing   Problem: Education: Goal: Knowledge of General Education information will improve Description: Including pain rating scale, medication(s)/side effects and non-pharmacologic comfort measures Outcome: Progressing   Problem: Health Behavior/Discharge Planning: Goal: Ability to manage health-related needs will improve Outcome: Progressing   Problem: Clinical Measurements: Goal: Ability to maintain clinical measurements within normal limits will improve Outcome: Progressing Goal: Will remain free from infection Outcome: Progressing Goal: Diagnostic test results will improve Outcome: Progressing Goal: Respiratory complications will improve Outcome: Progressing Goal: Cardiovascular complication will be avoided Outcome: Progressing   Problem: Activity: Goal: Risk for activity intolerance will decrease Outcome: Progressing   Problem: Nutrition: Goal: Adequate nutrition will be maintained Outcome: Progressing   Problem: Safety: Goal: Ability to  remain free from injury will improve Outcome: Progressing   Problem: Skin Integrity: Goal: Risk for impaired skin integrity will decrease Outcome: Progressing   Problem: Activity: Goal: Ability to tolerate increased activity will improve Outcome: Progressing   Problem: Clinical Measurements: Goal: Ability to maintain a body temperature in the normal range will improve Outcome: Progressing   Problem: Respiratory: Goal: Ability to maintain adequate ventilation will improve Outcome: Progressing Goal: Ability to maintain a clear airway will improve Outcome: Progressing

## 2023-12-02 NOTE — Progress Notes (Signed)
 Nurse at bedside,patient alert,and oriented times four. No c/o pain or discomfort noted. Blood pressure 131/49 heart rate was 63,Dr Ghimire notified. Plan or care on going.

## 2023-12-02 NOTE — Care Management Important Message (Signed)
 Important Message  Patient Details  Name: Connor Bryant MRN: [id number] Date of Birth: [birth year]   Important Message Given:  Yes - Medicare IM     Corey Harold 12/02/2023, 11:52 AM

## 2023-12-02 NOTE — Discharge Summary (Signed)
 Physician Discharge Summary  GARON MELANDER VWU:[id number] DOB: Jan 29, 1946 DOA: 11/26/2023  PCP: Mechele Claude, MD  Admit date: 11/26/2023 Discharge date: 12/02/2023  Admitted From: Home Disposition: Home with home health  Recommendations for Outpatient Follow-up:  Follow up with PCP in 1-2 weeks Please obtain BMP/CBC in one week Follow-up with cardiology as scheduled  Home Health: Home health PT Equipment/Devices: None  Discharge Condition: Stable CODE STATUS: Full code Diet recommendation: Low-salt diet  Discharge summary: Patient is 78 year old with diabetes, hypertension, hyperlipidemia, coronary artery disease, chronic heart failure with reduced EF, paroxysmal A-fib on Eliquis presented to the emergency room with generalized weakness, cough and shortness of breath.  Recently had his nosebleeding and had discontinued his Plavix on his own about a month ago.  In the emergency room he was found to have influenza A positive, FOBT positive, sodium 126, potassium 2.7, hemoglobin 9.7, incomplete RBBB.  Treated with IV fluids, IV potassium, antibiotics and admitted to the hospital.  Treated for following conditions.  Acute hypoxemic respiratory failure secondary to influenza A pneumonia: Needed initially BiPAP and subsequently on nasal cannula oxygen.  Symptomatically treated.  Received and completed antibiotics.  Completed Tamiflu.  Most of the respiratory symptoms have improved.  Now on room air with mobility.  GI bleeding, melena, acute on chronic anemia: Hemoglobin 9.4-8.2.  He takes Eliquis at home.  Not taking Plavix. Followed by GI.  Underwent upper GI endoscopy, no active bleeding was found.  Back on Eliquis and now with no evidence of bleeding.  Hemoglobin is stable. Eliquis dose reduced to 2.5 mg twice daily. Continue maintenance Protonix. Advised to go back on Plavix with his cardiologist recommendation, however patient is not taking.  Hyponatremia: Secondary to hypovolemia.   Improved.  Hypokalemia: Severe and persistent.  Replaced and adequate today.  Sending home on scheduled potassium replacement.  Chronic congestive heart failure with reduced EF: Treated with IV Lasix.  Known ejection fraction 30 to 35%. Chronic A-fib Patient on carvedilol at home, dose increased to 25 mg twice daily Amiodarone discontinued, started on Cardizem CD 120 mg daily Patient was on Lasix as needed at home, discharging with torsemide 20 mg daily along with potassium supplementation.  He is already on Aldactone. Patient had drug-eluting stent on LAD December 2024.  Advised to start Plavix, however patient is adamant he is not taking it. Blood pressures remained stable, Norvasc was discontinued and changed to diltiazem. Eliquis was adjusted to 2.5 mg twice daily as recommended by his cardiologist.  Patient with improved mobility today.  He is able to go home.    Discharge Diagnoses:  Principal Problem:   GI bleeding Active Problems:   Type 2 diabetes mellitus without complication, without long-term current use of insulin (HCC)   Elevated troponin   Rheumatoid arthritis involving multiple sites with positive rheumatoid factor (HCC)   Anemia   Paroxysmal atrial fibrillation (HCC)   HFrEF (heart failure with reduced ejection fraction) (HCC)   Coronary artery disease involving native coronary artery of native heart without angina pectoris   Hyponatremia   Hypokalemia   Influenza A   Melena    Discharge Instructions  Discharge Instructions     Diet - low sodium heart healthy   Complete by: As directed    Increase activity slowly   Complete by: As directed       Allergies as of 12/02/2023   No Known Allergies      Medication List     STOP taking these medications  amiodarone 200 MG tablet Commonly known as: PACERONE   furosemide 20 MG tablet Commonly known as: LASIX       TAKE these medications    Accu-Chek Softclix Lancets lancets SMARTSIG:Topical  1-3 Times Daily   acetaminophen 325 MG tablet Commonly known as: TYLENOL Take 2 tablets (650 mg total) by mouth every 4 (four) hours as needed for headache or mild pain (pain score 1-3).   allopurinol 100 MG tablet Commonly known as: ZYLOPRIM TAKE 1 TABLET 2 TO 3 TIMES A DAY FOR GOUT   apixaban 2.5 MG Tabs tablet Commonly known as: Eliquis Take 1 tablet (2.5 mg total) by mouth 2 (two) times daily. What changed:  medication strength how much to take   atorvastatin 80 MG tablet Commonly known as: LIPITOR Take 1 tablet (80 mg total) by mouth daily.   Blood Glucose Monitoring Suppl Devi 1 each by Does not apply route in the morning, at noon, and at bedtime. May substitute to any manufacturer covered by patient's insurance.   Accu-Chek Guide w/Device Kit CHECK BLOOD SUGAR UP TO THREE TIMES DAILY AS DIRECTED   Boostrix 5-2.5-18.5 LF-MCG/0.5 injection Generic drug: Tdap   CALCIUM 500 + D PO Take 1 tablet by mouth daily.   carvedilol 25 MG tablet Commonly known as: COREG Take 1 tablet (25 mg total) by mouth 2 (two) times daily. What changed:  medication strength how much to take   clopidogrel 75 MG tablet Commonly known as: PLAVIX Take 1 tablet (75 mg total) by mouth daily with breakfast.   diltiazem 180 MG 24 hr capsule Commonly known as: CARDIZEM CD Take 1 capsule (180 mg total) by mouth daily. Start taking on: December 03, 2023   Farxiga 10 MG Tabs tablet Generic drug: dapagliflozin propanediol Take 10 mg by mouth every morning.   ferrous sulfate 325 (65 FE) MG tablet Take 1 tablet (325 mg total) by mouth daily.   pantoprazole 40 MG tablet Commonly known as: PROTONIX TAKE ONE TABLET TWICE DAILY   potassium chloride SA 20 MEQ tablet Commonly known as: KLOR-CON M Take 1 tablet (20 mEq total) by mouth daily. Start taking on: December 03, 2023   spironolactone 25 MG tablet Commonly known as: ALDACTONE Take 0.5 tablets (12.5 mg total) by mouth daily.   Torsemide  40 MG Tabs Take 40 mg by mouth daily. Start taking on: December 03, 2023        No Known Allergies  Consultations: Cardiology, on the phone Dr. Algie Coffer   Procedures/Studies: DG CHEST PORT 1 VIEW Result Date: 11/29/2023 CLINICAL DATA:  Shortness of breath. EXAM: PORTABLE CHEST 1 VIEW COMPARISON:  X-ray 11/27/2023 and older FINDINGS: Enlarged cardiopericardial silhouette with calcified tortuous aorta. Diffuse interstitial changes are again seen. Tiny pleural effusions. More focal left retrocardiac opacity also identified. Apical pleural thickening calcifications. No pneumothorax. Overlapping cardiac leads. Degenerative changes along the spine. Old, healed left clavicle fracture. IMPRESSION: No significant oval change when adjusted for technique. Electronically Signed   By: Karen Kays M.D.   On: 11/29/2023 17:15   DG Chest Port 1 View Result Date: 11/27/2023 CLINICAL DATA:  Dyspnea. EXAM: PORTABLE CHEST 1 VIEW COMPARISON:  November 26, 2023. FINDINGS: Stable cardiomegaly is noted. Increased interstitial densities are noted throughout both lungs most consistent with pulmonary edema. Old left clavicular fracture is noted. IMPRESSION: Probable bilateral pulmonary edema. Electronically Signed   By: Lupita Raider M.D.   On: 11/27/2023 09:49   DG Chest Portable 1 View Result Date: 11/26/2023 CLINICAL  DATA:  Several day history of weakness, cough, congestion EXAM: PORTABLE CHEST 1 VIEW COMPARISON:  Chest radiograph dated 09/12/2023 FINDINGS: Patient is rotated to the left. Normal lung volumes. Vascular calcifications project over the bilateral apices. Prominent bilateral interstitial opacities. Patchy left retrocardiac opacities. No pleural effusion or pneumothorax. Similar mildly enlarged cardiomediastinal silhouette. No acute osseous abnormality. IMPRESSION: 1. Prominent bilateral interstitial opacities, which may represent pulmonary edema. 2. Patchy left retrocardiac opacities, which may represent  atelectasis, aspiration, or pneumonia. Electronically Signed   By: Agustin Cree M.D.   On: 11/26/2023 18:19   (Echo, Carotid, EGD, Colonoscopy, ERCP)    Subjective: Patient seen in the morning rounds.  Denies any complaints.  He feels slightly weaker but he feels like he should go home and get more mobility at home.  His son will help him around.  No other overnight events.   Discharge Exam: Vitals:   12/02/23 0719 12/02/23 0829  BP:  (!) 131/49  Pulse:  63  Resp:    Temp:  (!) 97.4 F (36.3 C)  SpO2: 99% 94%   Vitals:   12/01/23 2008 12/02/23 0547 12/02/23 0719 12/02/23 0829  BP: (!) 142/54 (!) 153/58  (!) 131/49  Pulse: 62 64  63  Resp:      Temp: 97.7 F (36.5 C) 97.8 F (36.6 C)  (!) 97.4 F (36.3 C)  TempSrc: Oral Oral  Oral  SpO2: 97% 95% 99% 94%  Weight:      Height:        General: Pt is alert, awake, not in acute distress Cardiovascular: RRR, S1/S2 +, no rubs, no gallops Respiratory: CTA bilaterally, no wheezing, no rhonchi Abdominal: Soft, NT, ND, bowel sounds + Extremities: no edema, no cyanosis    The results of significant diagnostics from this hospitalization (including imaging, microbiology, ancillary and laboratory) are listed below for reference.     Microbiology: Recent Results (from the past 240 hours)  Resp panel by RT-PCR (RSV, Flu A&B, Covid) Anterior Nasal Swab     Status: Abnormal   Collection Time: 11/26/23 12:59 PM   Specimen: Anterior Nasal Swab  Result Value Ref Range Status   SARS Coronavirus 2 by RT PCR NEGATIVE NEGATIVE Final    Comment: (NOTE) SARS-CoV-2 target nucleic acids are NOT DETECTED.  The SARS-CoV-2 RNA is generally detectable in upper respiratory specimens during the acute phase of infection. The lowest concentration of SARS-CoV-2 viral copies this assay can detect is 138 copies/mL. A negative result does not preclude SARS-Cov-2 infection and should not be used as the sole basis for treatment or other patient management  decisions. A negative result may occur with  improper specimen collection/handling, submission of specimen other than nasopharyngeal swab, presence of viral mutation(s) within the areas targeted by this assay, and inadequate number of viral copies(<138 copies/mL). A negative result must be combined with clinical observations, patient history, and epidemiological information. The expected result is Negative.  Fact Sheet for Patients:  BloggerCourse.com  Fact Sheet for Healthcare Providers:  SeriousBroker.it  This test is no t yet approved or cleared by the [place] FDA and  has been authorized for detection and/or diagnosis of SARS-CoV-2 by FDA under an Emergency Use Authorization (EUA). This EUA will remain  in effect (meaning this test can be used) for the duration of the COVID-19 declaration under Section 564(b)(1) of the Act, 21 U.S.C.section 360bbb-3(b)(1), unless the authorization is terminated  or revoked sooner.       Influenza A by PCR POSITIVE (  A) NEGATIVE Final   Influenza B by PCR NEGATIVE NEGATIVE Final    Comment: (NOTE) The Xpert Xpress SARS-CoV-2/FLU/RSV plus assay is intended as an aid in the diagnosis of influenza from Nasopharyngeal swab specimens and should not be used as a sole basis for treatment. Nasal washings and aspirates are unacceptable for Xpert Xpress SARS-CoV-2/FLU/RSV testing.  Fact Sheet for Patients: BloggerCourse.com  Fact Sheet for Healthcare Providers: SeriousBroker.it  This test is not yet approved or cleared by the [place] FDA and has been authorized for detection and/or diagnosis of SARS-CoV-2 by FDA under an Emergency Use Authorization (EUA). This EUA will remain in effect (meaning this test can be used) for the duration of the COVID-19 declaration under Section 564(b)(1) of the Act, 21 U.S.C. section 360bbb-3(b)(1), unless  the authorization is terminated or revoked.     Resp Syncytial Virus by PCR NEGATIVE NEGATIVE Final    Comment: (NOTE) Fact Sheet for Patients: BloggerCourse.com  Fact Sheet for Healthcare Providers: SeriousBroker.it  This test is not yet approved or cleared by the [place] FDA and has been authorized for detection and/or diagnosis of SARS-CoV-2 by FDA under an Emergency Use Authorization (EUA). This EUA will remain in effect (meaning this test can be used) for the duration of the COVID-19 declaration under Section 564(b)(1) of the Act, 21 U.S.C. section 360bbb-3(b)(1), unless the authorization is terminated or revoked.  Performed at [facility], [address], [place], [place] [address]   MRSA Next Gen by PCR, Nasal     Status: None   Collection Time: 11/27/23  8:00 AM   Specimen: Nasal Mucosa; Nasal Swab  Result Value Ref Range Status   MRSA by PCR Next Gen NOT DETECTED NOT DETECTED Final    Comment: (NOTE) The GeneXpert MRSA Assay (FDA approved for NASAL specimens only), is one component of a comprehensive MRSA colonization surveillance program. It is not intended to diagnose MRSA infection nor to guide or monitor treatment for MRSA infections. Test performance is not FDA approved in patients less than 62 years old. Performed at [facility], [address] [place], [place] [address]      Labs: BNP (last 3 results) Recent Labs    09/11/23 1637 11/26/23 1336 11/29/23 0418  BNP 1,478.1* 858.0* 2,033.0*   Basic Metabolic Panel: Recent Labs  Lab 11/28/23 0445 11/28/23 1707 11/29/23 0418 11/30/23 0335 12/01/23 0357 12/02/23 0347  NA 128* 131* 131* 133* 133* 135  K 2.6* 3.8 3.4* 3.7 3.6 3.6  CL 97* 96* 99 95* 97* 94*  CO2 27 27 25 26 30 31   GLUCOSE 127* 187* 179* 174* 208* 179*  BUN 10 12 13 18 23  28*  CREATININE 0.90 0.90 0.83 0.91 1.01 1.18  CALCIUM 7.5* 8.0* 8.2* 8.6* 8.3* 8.7*  MG 1.8  --  1.9 1.6*  1.6* 1.8  PHOS 2.2*  --  3.0 3.1 3.6 4.4   Liver Function Tests: Recent Labs  Lab 11/28/23 0445 11/29/23 0418 11/30/23 0335 12/01/23 0357 12/02/23 0347  AST 53* 39 29 21 17   ALT 53* 51* 43 36 29  ALKPHOS 57 59 65 58 57  BILITOT 0.6 0.7 0.7 0.7 0.9  PROT 5.4* 5.8* 6.1* 5.6* 5.8*  ALBUMIN 2.6* 2.7* 2.8* 2.6* 2.7*   No results for input(s): "LIPASE", "AMYLASE" in the last 168 hours. No results for input(s): "AMMONIA" in the last 168 hours. CBC: Recent Labs  Lab 11/28/23 0445 11/29/23 0418 11/30/23 0335 12/01/23 0357 12/02/23 0347  WBC 4.3 5.3 8.0 6.7  6.8  NEUTROABS 3.4 4.8 7.2 6.1 5.5  HGB 8.1* 8.2* 8.4* 8.2* 8.6*  HCT 25.0* 25.5* 26.2* 25.0* 26.4*  MCV 89.0 89.5 89.1 89.3 89.2  PLT 146* 175 211 217 261   Cardiac Enzymes: Recent Labs  Lab 11/26/23 1508  CKTOTAL 118   BNP: Invalid input(s): "POCBNP" CBG: Recent Labs  Lab 12/01/23 0756 12/01/23 1114 12/01/23 1621 12/01/23 2327 12/02/23 0715  GLUCAP 214* 254* 179* 170* 168*   D-Dimer No results for input(s): "DDIMER" in the last 72 hours. Hgb A1c No results for input(s): "HGBA1C" in the last 72 hours. Lipid Profile No results for input(s): "CHOL", "HDL", "LDLCALC", "TRIG", "CHOLHDL", "LDLDIRECT" in the last 72 hours. Thyroid function studies No results for input(s): "TSH", "T4TOTAL", "T3FREE", "THYROIDAB" in the last 72 hours.  Invalid input(s): "FREET3" Anemia work up Recent Labs    11/30/23 0335 12/01/23 0748  TIBC 215* 211*  IRON 76 53   Urinalysis    Component Value Date/Time   COLORURINE YELLOW 11/26/2023 1320   APPEARANCEUR HAZY (A) 11/26/2023 1320   LABSPEC 1.012 11/26/2023 1320   PHURINE 7.0 11/26/2023 1320   GLUCOSEU >=500 (A) 11/26/2023 1320   HGBUR SMALL (A) 11/26/2023 1320   BILIRUBINUR NEGATIVE 11/26/2023 1320   KETONESUR NEGATIVE 11/26/2023 1320   PROTEINUR 100 (A) 11/26/2023 1320   UROBILINOGEN 1.0 03/04/2014 1358   NITRITE NEGATIVE 11/26/2023 1320   LEUKOCYTESUR LARGE (A)  11/26/2023 1320   Sepsis Labs Recent Labs  Lab 11/29/23 0418 11/30/23 0335 12/01/23 0357 12/02/23 0347  WBC 5.3 8.0 6.7 6.8   Microbiology Recent Results (from the past 240 hours)  Resp panel by RT-PCR (RSV, Flu A&B, Covid) Anterior Nasal Swab     Status: Abnormal   Collection Time: 11/26/23 12:59 PM   Specimen: Anterior Nasal Swab  Result Value Ref Range Status   SARS Coronavirus 2 by RT PCR NEGATIVE NEGATIVE Final    Comment: (NOTE) SARS-CoV-2 target nucleic acids are NOT DETECTED.  The SARS-CoV-2 RNA is generally detectable in upper respiratory specimens during the acute phase of infection. The lowest concentration of SARS-CoV-2 viral copies this assay can detect is 138 copies/mL. A negative result does not preclude SARS-Cov-2 infection and should not be used as the sole basis for treatment or other patient management decisions. A negative result may occur with  improper specimen collection/handling, submission of specimen other than nasopharyngeal swab, presence of viral mutation(s) within the areas targeted by this assay, and inadequate number of viral copies(<138 copies/mL). A negative result must be combined with clinical observations, patient history, and epidemiological information. The expected result is Negative.  Fact Sheet for Patients:  BloggerCourse.com  Fact Sheet for Healthcare Providers:  SeriousBroker.it  This test is no t yet approved or cleared by the [place] FDA and  has been authorized for detection and/or diagnosis of SARS-CoV-2 by FDA under an Emergency Use Authorization (EUA). This EUA will remain  in effect (meaning this test can be used) for the duration of the COVID-19 declaration under Section 564(b)(1) of the Act, 21 U.S.C.section 360bbb-3(b)(1), unless the authorization is terminated  or revoked sooner.       Influenza A by PCR POSITIVE (A) NEGATIVE Final   Influenza B by PCR  NEGATIVE NEGATIVE Final    Comment: (NOTE) The Xpert Xpress SARS-CoV-2/FLU/RSV plus assay is intended as an aid in the diagnosis of influenza from Nasopharyngeal swab specimens and should not be used as a sole basis for treatment. Nasal washings and aspirates are unacceptable  for Xpert Xpress SARS-CoV-2/FLU/RSV testing.  Fact Sheet for Patients: BloggerCourse.com  Fact Sheet for Healthcare Providers: SeriousBroker.it  This test is not yet approved or cleared by the [place] FDA and has been authorized for detection and/or diagnosis of SARS-CoV-2 by FDA under an Emergency Use Authorization (EUA). This EUA will remain in effect (meaning this test can be used) for the duration of the COVID-19 declaration under Section 564(b)(1) of the Act, 21 U.S.C. section 360bbb-3(b)(1), unless the authorization is terminated or revoked.     Resp Syncytial Virus by PCR NEGATIVE NEGATIVE Final    Comment: (NOTE) Fact Sheet for Patients: BloggerCourse.com  Fact Sheet for Healthcare Providers: SeriousBroker.it  This test is not yet approved or cleared by the [place] FDA and has been authorized for detection and/or diagnosis of SARS-CoV-2 by FDA under an Emergency Use Authorization (EUA). This EUA will remain in effect (meaning this test can be used) for the duration of the COVID-19 declaration under Section 564(b)(1) of the Act, 21 U.S.C. section 360bbb-3(b)(1), unless the authorization is terminated or revoked.  Performed at [facility], [address], [place], [place] [address]   MRSA Next Gen by PCR, Nasal     Status: None   Collection Time: 11/27/23  8:00 AM   Specimen: Nasal Mucosa; Nasal Swab  Result Value Ref Range Status   MRSA by PCR Next Gen NOT DETECTED NOT DETECTED Final    Comment: (NOTE) The GeneXpert MRSA Assay (FDA approved for NASAL specimens only), is one  component of a comprehensive MRSA colonization surveillance program. It is not intended to diagnose MRSA infection nor to guide or monitor treatment for MRSA infections. Test performance is not FDA approved in patients less than 66 years old. Performed at [facility], [address] [place], [place] [address]      Time coordinating discharge: 35 minutes  SIGNED:   Dorcas Carrow, MD  [facility] 12/02/2023, 9:57 AM

## 2023-12-03 ENCOUNTER — Telehealth: Payer: Self-pay | Admitting: *Deleted

## 2023-12-03 NOTE — Transitions of Care (Post Inpatient/ED Visit) (Signed)
 12/03/2023  Name: Connor Bryant MRN: [id number] DOB: 1945/10/02  Today's TOC FU Call Status: Today's TOC FU Call Status:: Successful TOC FU Call Completed TOC FU Call Complete Date: 12/03/23 Patient's Name and Date of Birth confirmed.  Transition Care Management Follow-up Telephone Call Date of Discharge: 12/02/23 [facility] Type of Discharge: Inpatient Admission Primary Inpatient Discharge Diagnosis:: GI bleeding How have you been since you were released from the hospital?:  (eating and drinking well, no issues with bowel/ bladder, ambulating with walker, denies any GI bleeding/ melena, states taking medications as prescribed) Any questions or concerns?: No  Items Reviewed: Did you receive and understand the discharge instructions provided?: Yes Medications obtained,verified, and reconciled?: Yes (Medications Reviewed) Any new allergies since your discharge?: No Dietary orders reviewed?: Yes Type of Diet Ordered:: low sodium, heart healthy Do you have support at home?: Yes People in Home: alone Name of Support/Comfort Primary Source: Floy Sabina Patient declines TOC 30 day program, would like to continue working with [facility] that he is already active with, call scheduled with Marval Regal on 3/17 @ 315 pm, pt verbalizes understanding Reviewed signs/ symptoms of bleeding, reportable signs/ symptoms, bleeding precautions Patient states he discussed with doctor at hospital that he is not taking Plavix and refuses to do so, pt is taking Eliquis as prescribed RN Care Manager will follow up on [facility] and send update to RN Care Manager already active with pt Telephone call to [facility] Intake, unable to speak with anyone, left a voicemail requesting return phone call. Telephone call to PT Stacy from [facility] who reports she will be seeing pt tomorrow 3/12 for resumption of care, she will call pt this evening to set up a  time. In basket message sent to primary RN Care Manager Marval Regal with update.   Medications Reviewed Today: Medications Reviewed Today     Reviewed by Audrie Gallus, RN (Registered Nurse) on 12/03/23 at 1427  Med List Status: <None>   Medication Order Taking? Sig Documenting Provider Last Dose Status Informant  Accu-Chek Softclix Lancets lancets [id number] Yes SMARTSIG:Topical 1-3 Times Daily [provider] Taking Active   acetaminophen (TYLENOL) 325 MG tablet [id number] Yes Take 2 tablets (650 mg total) by mouth every 4 (four) hours as needed for headache or mild pain (pain score 1-3). Lee, [place], NP Taking Active   allopurinol (ZYLOPRIM) 100 MG tablet [id number] Yes TAKE 1 TABLET 2 TO 3 TIMES A DAY FOR GOUT Stacks, Broadus John, MD Taking Active            Med Note Andrey Campanile, MACI D   Wed Nov 27, 2023  7:40 AM) Dispense date: 11/19/2023 Qty: 90 each  apixaban (ELIQUIS) 2.5 MG TABS tablet [id number] Yes Take 1 tablet (2.5 mg total) by mouth 2 (two) times daily. Dorcas Carrow, MD Taking Active   atorvastatin (LIPITOR) 80 MG tablet [id number] Yes Take 1 tablet (80 mg total) by mouth daily. Mechele Claude, MD Taking Active            Med Note Andrey Campanile, MACI D   Wed Nov 27, 2023  7:41 AM) Dispense date: 08/27/2023 Qty: 90 each  Blood Glucose Monitoring Suppl (ACCU-CHEK GUIDE) w/Device Andria Rhein [id number] Yes CHECK BLOOD SUGAR UP TO THREE TIMES DAILY AS DIRECTED [provider] Taking Active   Blood Glucose Monitoring Suppl DEVI [id number] Yes 1 each by Does not apply route in the morning, at noon, and at bedtime. May substitute to any  manufacturer covered by [employer]. Mechele Claude, MD Taking Active Self  BOOSTRIX 5-2.5-18.5 LF-MCG/0.5 injection [id number] Yes  [provider] Taking Active            Med Note Andrey Campanile, MACI D   Wed Nov 27, 2023  7:40 AM) Dispense date: 08/01/2023 Qty: 1  Calcium Carb-Cholecalciferol (CALCIUM 500 + D PO) [id number] Yes Take 1 tablet by mouth  daily. [provider] Taking Active Self  carvedilol (COREG) 25 MG tablet [id number] Yes Take 1 tablet (25 mg total) by mouth 2 (two) times daily. Dorcas Carrow, MD Taking Active   clopidogrel (PLAVIX) 75 MG tablet [id number] No Take 1 tablet (75 mg total) by mouth daily with breakfast.  Patient not taking: Reported on 12/03/2023   Lee, [place], NP Not Taking Active            Med Note (Ranell Skibinski A   Tue Dec 03, 2023  2:26 PM) Pt states it is his decision and he stopped taking  diltiazem (CARDIZEM CD) 180 MG 24 hr capsule [id number] Yes Take 1 capsule (180 mg total) by mouth daily. Dorcas Carrow, MD Taking Active   FARXIGA 10 MG TABS tablet [id number] Yes Take 10 mg by mouth every morning. [provider] Taking Active            Med Note Andrey Campanile, MACI D   Wed Nov 27, 2023  7:41 AM) Dispense date: 11/05/2023 Qty: 30 each  ferrous sulfate 325 (65 FE) MG tablet [id number] Yes Take 1 tablet (325 mg total) by mouth daily. Lee, [place], NP Taking Active   pantoprazole (PROTONIX) 40 MG tablet [id number] Yes TAKE ONE TABLET TWICE DAILY Mechele Claude, MD Taking Active            Med Note Andrey Campanile, MACI D   Wed Nov 27, 2023  7:41 AM) Dispense date: 11/21/2023 Qty: 60 each  potassium chloride SA (KLOR-CON M) 20 MEQ tablet [id number] Yes Take 1 tablet (20 mEq total) by mouth daily. Dorcas Carrow, MD Taking Active   spironolactone (ALDACTONE) 25 MG tablet [id number] Yes Take 0.5 tablets (12.5 mg total) by mouth daily. Laurey Morale, MD Taking Active Self           Med Note Andrey Campanile, MACI D   Wed Nov 27, 2023  7:41 AM) Dispense date: 09/03/2023 Qty: 45 each  torsemide 40 MG TABS [id number] Yes Take 40 mg by mouth daily. Dorcas Carrow, MD Taking Active             Home Care and Equipment/Supplies: Were Home Health Services Ordered?: Yes Name of Home Health Agency:: [facility] Has Agency set up a time to come to your home?: No EMR reviewed for Home Health Orders:  (telephone call to  [facility], they cannot find patient in their system, called intake at [phone], on hold, not able to speak with anyone, will try back later today, pt states he was already active with [facility] previously before hospitalization) Any new equipment or medical supplies ordered?: No  Functional Questionnaire: Do you need assistance with bathing/showering or dressing?: No Do you need assistance with meal preparation?: No Do you need assistance with eating?: No Do you have difficulty maintaining continence: No Do you need assistance with getting out of bed/getting out of a chair/moving?: Yes (uses walker) Do you have difficulty managing or taking your medications?: No  Follow up appointments reviewed: PCP Follow-up appointment confirmed?:  (pt states he will work out the appointment with his son,  son works,  pt will call and make appointment) MD Provider Line Number:[phone] Given: No Specialist Hospital Follow-up appointment confirmed?: Yes Date of Specialist follow-up appointment?: 12/17/23 Follow-Up Specialty Provider:: Marca Ancona MD   cardiology @ 140 pm Do you need transportation to your follow-up appointment?: No Do you understand care options if your condition(s) worsen?: Yes-patient verbalized understanding  SDOH Interventions Today    Flowsheet Row Most Recent Value  SDOH Interventions   Food Insecurity Interventions Intervention Not Indicated  Housing Interventions Intervention Not Indicated  Transportation Interventions Intervention Not Indicated  Utilities Interventions Intervention Not Indicated       Irving Shows [facility], BSN RN Care Manager/ Transition of Care [place]/ [facility] [phone]

## 2023-12-04 ENCOUNTER — Ambulatory Visit: Payer: Medicare Other

## 2023-12-04 ENCOUNTER — Telehealth: Payer: Self-pay

## 2023-12-04 VITALS — Ht 68.0 in | Wt 184.0 lb

## 2023-12-04 DIAGNOSIS — M4800 Spinal stenosis, site unspecified: Secondary | ICD-10-CM | POA: Diagnosis not present

## 2023-12-04 DIAGNOSIS — D631 Anemia in chronic kidney disease: Secondary | ICD-10-CM | POA: Diagnosis not present

## 2023-12-04 DIAGNOSIS — I2510 Atherosclerotic heart disease of native coronary artery without angina pectoris: Secondary | ICD-10-CM | POA: Diagnosis not present

## 2023-12-04 DIAGNOSIS — D500 Iron deficiency anemia secondary to blood loss (chronic): Secondary | ICD-10-CM | POA: Diagnosis not present

## 2023-12-04 DIAGNOSIS — E785 Hyperlipidemia, unspecified: Secondary | ICD-10-CM | POA: Diagnosis not present

## 2023-12-04 DIAGNOSIS — J9610 Chronic respiratory failure, unspecified whether with hypoxia or hypercapnia: Secondary | ICD-10-CM | POA: Diagnosis not present

## 2023-12-04 DIAGNOSIS — Z7984 Long term (current) use of oral hypoglycemic drugs: Secondary | ICD-10-CM | POA: Diagnosis not present

## 2023-12-04 DIAGNOSIS — I480 Paroxysmal atrial fibrillation: Secondary | ICD-10-CM | POA: Diagnosis not present

## 2023-12-04 DIAGNOSIS — I5023 Acute on chronic systolic (congestive) heart failure: Secondary | ICD-10-CM | POA: Diagnosis not present

## 2023-12-04 DIAGNOSIS — M0579 Rheumatoid arthritis with rheumatoid factor of multiple sites without organ or systems involvement: Secondary | ICD-10-CM | POA: Diagnosis not present

## 2023-12-04 DIAGNOSIS — N1831 Chronic kidney disease, stage 3a: Secondary | ICD-10-CM | POA: Diagnosis not present

## 2023-12-04 DIAGNOSIS — Z0000 Encounter for general adult medical examination without abnormal findings: Secondary | ICD-10-CM

## 2023-12-04 DIAGNOSIS — E1122 Type 2 diabetes mellitus with diabetic chronic kidney disease: Secondary | ICD-10-CM | POA: Diagnosis not present

## 2023-12-04 DIAGNOSIS — I252 Old myocardial infarction: Secondary | ICD-10-CM | POA: Diagnosis not present

## 2023-12-04 DIAGNOSIS — Z7901 Long term (current) use of anticoagulants: Secondary | ICD-10-CM | POA: Diagnosis not present

## 2023-12-04 DIAGNOSIS — Z955 Presence of coronary angioplasty implant and graft: Secondary | ICD-10-CM | POA: Diagnosis not present

## 2023-12-04 DIAGNOSIS — I130 Hypertensive heart and chronic kidney disease with heart failure and stage 1 through stage 4 chronic kidney disease, or unspecified chronic kidney disease: Secondary | ICD-10-CM | POA: Diagnosis not present

## 2023-12-04 DIAGNOSIS — I959 Hypotension, unspecified: Secondary | ICD-10-CM | POA: Diagnosis not present

## 2023-12-04 DIAGNOSIS — M109 Gout, unspecified: Secondary | ICD-10-CM | POA: Diagnosis not present

## 2023-12-04 NOTE — Telephone Encounter (Signed)
 Copied from CRM [id number]. Topic: Clinical - Home Health Verbal Orders >> Dec 04, 2023  1:03 PM Higinio Roger wrote: Caller/Agency: [facility] Bill Number: [id number]  Service Requested: Physical Therapy  Frequency: twice a week 2 week once a week for 2 week  Any new concerns about the patient? No

## 2023-12-04 NOTE — Progress Notes (Signed)
 Subjective:   Connor Bryant is a 78 y.o. who presents for a Medicare Wellness preventive visit.  Visit Complete: Virtual I connected with  Connor Bryant on 12/04/23 by a audio enabled telemedicine application and verified that I am speaking with the correct person using two identifiers.  Patient Location: Home  Provider Location: Home Office  I discussed the limitations of evaluation and management by telemedicine. The patient expressed understanding and agreed to proceed.  Vital Signs: Because this visit was a virtual/telehealth visit, some criteria may be missing or patient reported. Any vitals not documented were not able to be obtained and vitals that have been documented are patient reported.  VideoDeclined- This patient declined Librarian, academic. Therefore the visit was completed with audio only.  AWV Questionnaire: No: Patient Medicare AWV questionnaire was not completed prior to this visit.  Cardiac Risk Factors include: advanced age (>27men, >23 women);diabetes mellitus;hypertension;male gender;dyslipidemia     Objective:    Today's Vitals   12/04/23 1359  Weight: 184 lb (83.5 kg)  Height: 5\' 8"  (1.727 m)   Body mass index is 27.98 kg/m.     12/04/2023    2:37 PM 11/26/2023   11:30 PM 11/26/2023    8:04 PM 11/26/2023    1:02 PM 11/13/2023    9:42 PM 09/11/2023    3:53 PM 09/11/2023   10:46 AM  Advanced Directives  Does Patient Have a Medical Advance Directive? No No No No No Yes Yes  Type of Psychologist, occupational Power of Attorney  Does patient want to make changes to medical advance directive?      No - Patient declined   Copy of Healthcare Power of Attorney in Chart?      No - copy requested   Would patient like information on creating a medical advance directive? Yes (MAU/Ambulatory/Procedural Areas - Information given) No - Patient declined No - Patient declined No - Patient declined No -  Patient declined      Current Medications (verified) Outpatient Encounter Medications as of 12/04/2023  Medication Sig   Accu-Chek Softclix Lancets lancets SMARTSIG:Topical 1-3 Times Daily   acetaminophen (TYLENOL) 325 MG tablet Take 2 tablets (650 mg total) by mouth every 4 (four) hours as needed for headache or mild pain (pain score 1-3).   allopurinol (ZYLOPRIM) 100 MG tablet TAKE 1 TABLET 2 TO 3 TIMES A DAY FOR GOUT   apixaban (ELIQUIS) 2.5 MG TABS tablet Take 1 tablet (2.5 mg total) by mouth 2 (two) times daily.   atorvastatin (LIPITOR) 80 MG tablet Take 1 tablet (80 mg total) by mouth daily.   Blood Glucose Monitoring Suppl (ACCU-CHEK GUIDE) w/Device KIT CHECK BLOOD SUGAR UP TO THREE TIMES DAILY AS DIRECTED   Blood Glucose Monitoring Suppl DEVI 1 each by Does not apply route in the morning, at noon, and at bedtime. May substitute to any manufacturer covered by patient's insurance.   Calcium Carb-Cholecalciferol (CALCIUM 500 + D PO) Take 1 tablet by mouth daily.   carvedilol (COREG) 25 MG tablet Take 1 tablet (25 mg total) by mouth 2 (two) times daily.   diltiazem (CARDIZEM CD) 180 MG 24 hr capsule Take 1 capsule (180 mg total) by mouth daily.   FARXIGA 10 MG TABS tablet Take 10 mg by mouth every morning.   ferrous sulfate 325 (65 FE) MG tablet Take 1 tablet (325 mg total) by mouth daily.   pantoprazole (PROTONIX) 40  MG tablet TAKE ONE TABLET TWICE DAILY   potassium chloride SA (KLOR-CON M) 20 MEQ tablet Take 1 tablet (20 mEq total) by mouth daily.   spironolactone (ALDACTONE) 25 MG tablet Take 0.5 tablets (12.5 mg total) by mouth daily.   torsemide 40 MG TABS Take 40 mg by mouth daily.   clopidogrel (PLAVIX) 75 MG tablet Take 1 tablet (75 mg total) by mouth daily with breakfast. (Patient not taking: Reported on 12/04/2023)   [DISCONTINUED] BOOSTRIX 5-2.5-18.5 LF-MCG/0.5 injection    No facility-administered encounter medications on file as of 12/04/2023.    Allergies  (verified) Patient has no known allergies.   History: Past Medical History:  Diagnosis Date   Acute CVA (cerebrovascular accident) (HCC) 05/01/2020   Acute respiratory failure with hypoxia (HCC) 05/04/2020   Arthritis    Atrial fibrillation (HCC)    DM type 2 (diabetes mellitus, type 2) (HCC)    no meds   Endotracheally intubated    Gallstone    Heart murmur    was told one time he had a murmur, not since 30 years ago   HTN (hypertension)    Hyperlipidemia    Myocardial infarct, old    2021   Personal history of colonic polyp-adenoma 11/24/2013   Renal insufficiency    mild   Thoracic spine fracture [facility])    Past Surgical History:  Procedure Laterality Date   ABDOMINAL AORTAGRAM N/A 02/23/2014   Procedure: ABDOMINAL Ronny Flurry;  Surgeon: Fransisco Hertz, MD;  Location: [facility] CATH LAB;  Service: Cardiovascular;  Laterality: N/A;   BIOPSY  08/20/2023   Procedure: BIOPSY;  Surgeon: Imogene Burn, MD;  Location: [facility] ENDOSCOPY;  Service: Gastroenterology;;   CATARACT EXTRACTION, BILATERAL     COLONOSCOPY     CORONARY ATHERECTOMY N/A 09/16/2023   Procedure: CORONARY ATHERECTOMY;  Surgeon: Swaziland, Peter M, MD;  Location: [facility] INVASIVE CV LAB;  Service: Cardiovascular;  Laterality: N/A;   CORONARY LITHOTRIPSY N/A 09/16/2023   Procedure: CORONARY LITHOTRIPSY;  Surgeon: Swaziland, Peter M, MD;  Location: [facility] INVASIVE CV LAB;  Service: Cardiovascular;  Laterality: N/A;   CORONARY STENT INTERVENTION N/A 09/16/2023   Procedure: CORONARY STENT INTERVENTION;  Surgeon: Swaziland, Peter M, MD;  Location: [facility] INVASIVE CV LAB;  Service: Cardiovascular;  Laterality: N/A;   CORONARY ULTRASOUND/IVUS N/A 09/16/2023   Procedure: Coronary Ultrasound/IVUS;  Surgeon: Swaziland, Peter M, MD;  Location: [facility] INVASIVE CV LAB;  Service: Cardiovascular;  Laterality: N/A;   DEBRIDEMENT TENNIS ELBOW Right    ESOPHAGOGASTRODUODENOSCOPY     ESOPHAGOGASTRODUODENOSCOPY N/A 11/30/2023   Procedure: EGD (ESOPHAGOGASTRODUODENOSCOPY);   Surgeon: Marguerita Merles, Reuel Boom, MD;  Location: [facility] ENDO SUITE;  Service: Gastroenterology;  Laterality: N/A;   ESOPHAGOGASTRODUODENOSCOPY (EGD) WITH PROPOFOL N/A 08/20/2023   Procedure: ESOPHAGOGASTRODUODENOSCOPY (EGD) WITH PROPOFOL;  Surgeon: Imogene Burn, MD;  Location: [facility] ENDOSCOPY;  Service: Gastroenterology;  Laterality: N/A;   MESENTERIC ARTERY BYPASS N/A 03/10/2014   Procedure: AORTO TO SUPERIOR MESENTERIC ARTERY BYPASS;  Surgeon: Pryor Ochoa, MD;  Location: [facility] OR;  Service: Vascular;  Laterality: N/A;   RIGHT/LEFT HEART CATH AND CORONARY ANGIOGRAPHY N/A 09/11/2023   Procedure: RIGHT/LEFT HEART CATH AND CORONARY ANGIOGRAPHY;  Surgeon: Laurey Morale, MD;  Location: [facility] INVASIVE CV LAB;  Service: Cardiovascular;  Laterality: N/A;   TONSILLECTOMY AND ADENOIDECTOMY     vascualr surgery     illiac stents   Family History  Problem Relation Age of Onset   Diabetes Mother    Heart disease Mother  before age 23   Hyperlipidemia Mother    Hypertension Mother    AAA (abdominal aortic aneurysm) Mother    Stroke Father    Hypertension Father    Coronary artery disease Father    Hyperlipidemia Father    Diabetes Father    Bladder Cancer Father    Cancer Father    Heart disease Father    Heart attack Father    Colon cancer Neg Hx    Throat cancer Neg Hx    Kidney disease Neg Hx    Liver disease Neg Hx    Social History   Socioeconomic History   Marital status: Widowed    Spouse name: Not on file   Number of children: 1   Years of education: Not on file   Highest education level: High school graduate  Occupational History   Occupation: Retired  Tobacco Use   Smoking status: Former    Current packs/day: 0.00    Types: Cigarettes    Quit date: 07/08/2009    Years since quitting: 14.4    Passive exposure: Past   Smokeless tobacco: Never  Vaping Use   Vaping status: Never Used  Substance and Sexual Activity   Alcohol use: Not Currently   Drug use: Not  Currently   Sexual activity: Yes  Other Topics Concern   Not on file  Social History Narrative   Patient is married, he is retired. 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   Social Drivers of Health   Financial Resource Strain: Low Risk  (12/04/2023)   Overall Financial Resource Strain (CARDIA)    Difficulty of Paying Living Expenses: Not hard at all  Food Insecurity: No Food Insecurity (12/04/2023)   Hunger Vital Sign    Worried About Running Out of Food in the Last Year: Never true    Ran Out of Food in the Last Year: Never true  Transportation Needs: No Transportation Needs (12/04/2023)   PRAPARE - Administrator, Civil Service (Medical): No    Lack of Transportation (Non-Medical): No  Physical Activity: Inactive (12/04/2023)   Exercise Vital Sign    Days of Exercise per Week: 0 days    Minutes of Exercise per Session: 0 min  Stress: No Stress Concern Present (12/04/2023)   [employer] of Occupational Health - Occupational Stress Questionnaire    Feeling of Stress : Not at all  Social Connections: Moderately Isolated (12/04/2023)   Social Connection and Isolation Panel [NHANES]    Frequency of Communication with Friends and Family: More than three times a week    Frequency of Social Gatherings with Friends and Family: Three times a week    Attends Religious Services: Never    Active Member of Clubs or Organizations: No    Attends Engineer, structural: Never    Marital Status: Married    Tobacco Counseling Counseling given: Not Answered    Clinical Intake:  Pre-visit preparation completed: Yes  Pain : No/denies pain     Diabetes: Yes CBG done?: No Did pt. bring in CBG monitor from home?: No  How often do you need to have someone help you when you read instructions, pamphlets, or other written materials from your doctor or pharmacy?: 1 - Never  Interpreter Needed?: No  Information entered by :: Kandis Fantasia  LPN   Activities of Daily Living     12/04/2023    1:59 PM 11/26/2023   11:34 PM  In your present  state of health, do you have any difficulty performing the following activities:  Hearing? 0   Vision? 0   Difficulty concentrating or making decisions? 0   Walking or climbing stairs? 1   Dressing or bathing? 0   Doing errands, shopping? 1 0  Preparing Food and eating ? N   Using the Toilet? N   In the past six months, have you accidently leaked urine? N   Do you have problems with loss of bowel control? N   Managing your Medications? N   Managing your Finances? N   Housekeeping or managing your Housekeeping? Y     Patient Care Team: Mechele Claude, MD as PCP - General (Family Medicine) Wyline Mood Dorothe Pea, MD as PCP - Cardiology (Cardiology) Arita Miss, MD as Attending Physician (Nephrology) Clinton Gallant, RN as VBCI Care Management  Indicate any recent Medical Services you may have received from other than [facility] providers in the past year (date may be approximate).     Assessment:   This is a routine wellness examination for Andrius.  Hearing/Vision screen Hearing Screening - Comments:: Denies hearing difficulties   Vision Screening - Comments:: Wears rx glasses - up to date with routine eye exams with [facility]. Wyn Forster     Goals Addressed             This Visit's Progress    Prevent falls         Depression Screen     12/04/2023    2:18 PM 10/21/2023    6:59 PM 10/15/2023    1:26 PM 08/27/2023    2:09 PM 08/01/2023    4:02 PM 08/01/2023    3:40 PM 06/25/2023    2:51 PM  PHQ 2/9 Scores  PHQ - 2 Score 1 1 2  0 2 0 1  PHQ- 9 Score 7  9  3  4     Fall Risk     12/04/2023    2:37 PM 08/27/2023    2:09 PM 08/01/2023    3:40 PM 06/25/2023    2:40 PM 06/14/2023   11:13 AM  Fall Risk   Falls in the past year? 0 0 0 0 0  Number falls in past yr: 0      Injury with Fall? 0      Risk for fall due to : No Fall Risks      Follow up Falls prevention  discussed;Education provided;Falls evaluation completed        MEDICARE RISK AT HOME:  Medicare Risk at Home Any stairs in or around the home?: No If so, are there any without handrails?: No Home free of loose throw rugs in walkways, pet beds, electrical cords, etc?: Yes Adequate lighting in your home to reduce risk of falls?: Yes Life alert?: No Use of a cane, walker or w/c?: Yes Grab bars in the bathroom?: Yes Shower chair or bench in shower?: Yes Elevated toilet seat or a handicapped toilet?: Yes  TIMED UP AND GO:  Was the test performed?  No  Cognitive Function: 6CIT completed        12/04/2023    2:38 PM 12/03/2022    2:56 PM 11/27/2021    3:11 PM  6CIT Screen  What Year? 0 points 0 points 0 points  What month? 0 points 0 points 0 points  What time? 0 points 0 points 0 points  Count back from 20 0 points 0 points 0 points  Months in reverse 2 points 0 points  0 points  Repeat phrase 0 points 2 points 2 points  Total Score 2 points 2 points 2 points    Immunizations Immunization History  Administered Date(s) Administered   Fluad Quad(high Dose 65+) 08/27/2019, 08/30/2020, 06/14/2021   Fluad Trivalent(High Dose 65+) 06/14/2023   Influenza Split 07/06/2016, 06/24/2017, 08/26/2018   Influenza, High Dose Seasonal PF 07/06/2016, 06/24/2017, 08/26/2018, 08/27/2019   Tdap 08/01/2023    Screening Tests Health Maintenance  Topic Date Due   Diabetic kidney evaluation - Urine ACR  02/28/2021   Lung Cancer Screening  05/08/2021   COVID-19 Vaccine (1 - 2024-25 season) Never done   FOOT EXAM  11/21/2023   OPHTHALMOLOGY EXAM  12/13/2023 (Originally 09/27/2023)   Colonoscopy  12/13/2023 (Originally 11/17/2018)   Zoster Vaccines- Shingrix (1 of 2) 01/13/2024 (Originally 12/12/1994)   Pneumonia Vaccine 48+ Years old (1 of 2 - PCV) 02/19/2024 (Originally 12/12/1951)   HEMOGLOBIN A1C  02/17/2024   Diabetic kidney evaluation - eGFR measurement  12/01/2024   Medicare Annual Wellness  (AWV)  12/03/2024   DTaP/Tdap/Td (2 - Td or Tdap) 07/31/2033   INFLUENZA VACCINE  Completed   Hepatitis C Screening  Completed   HPV VACCINES  Aged Out    Health Maintenance  Health Maintenance Due  Topic Date Due   Diabetic kidney evaluation - Urine ACR  02/28/2021   Lung Cancer Screening  05/08/2021   COVID-19 Vaccine (1 - 2024-25 season) Never done   FOOT EXAM  11/21/2023    Additional Screening:  Vision Screening: Recommended annual ophthalmology exams for early detection of glaucoma and other disorders of the eye.  Dental Screening: Recommended annual dental exams for proper oral hygiene  Community Resource Referral / Chronic Care Management: CRR required this visit?  No   CCM required this visit?  No     Plan:     I have personally reviewed and noted the following in the patient's chart:   Medical and social history Use of alcohol, tobacco or illicit drugs  Current medications and supplements including opioid prescriptions. Patient is not currently taking opioid prescriptions. Functional ability and status Nutritional status Physical activity Advanced directives List of other physicians Hospitalizations, surgeries, and ER visits in previous 12 months Vitals Screenings to include cognitive, depression, and falls Referrals and appointments  In addition, I have reviewed and discussed with patient certain preventive protocols, quality metrics, and best practice recommendations. A written personalized care plan for preventive services as well as general preventive health recommendations were provided to patient.     Kandis Fantasia [place], [place]   1/61/0960   After Visit Summary: (MyChart) Due to this being a telephonic visit, the after visit summary with patients personalized plan was offered to patient via MyChart   Notes: Nothing significant to report at this time.

## 2023-12-04 NOTE — Patient Instructions (Signed)
 Mr. Connor Bryant , Thank you for taking time to come for your Medicare Wellness Visit. I appreciate your ongoing commitment to your health goals. Please review the following plan we discussed and let me know if I can assist you in the future.   Referrals/Orders/Follow-Ups/Clinician Recommendations: Aim for 30 minutes of exercise or brisk walking, 6-8 glasses of water, and 5 servings of fruits and vegetables each day.  This is a list of the screening recommended for you and due dates:  Health Maintenance  Topic Date Due   Yearly kidney health urinalysis for diabetes  02/28/2021   Screening for Lung Cancer  05/08/2021   COVID-19 Vaccine (1 - 2024-25 season) Never done   Complete foot exam   11/21/2023   Eye exam for diabetics  12/13/2023*   Colon Cancer Screening  12/13/2023*   Zoster (Shingles) Vaccine (1 of 2) 01/13/2024*   Pneumonia Vaccine (1 of 2 - PCV) 02/19/2024*   Hemoglobin A1C  02/17/2024   Yearly kidney function blood test for diabetes  12/01/2024   Medicare Annual Wellness Visit  12/03/2024   DTaP/Tdap/Td vaccine (2 - Td or Tdap) 07/31/2033   Flu Shot  Completed   Hepatitis C Screening  Completed   HPV Vaccine  Aged Out  *Topic was postponed. The date shown is not the original due date.    Advanced directives: (ACP Link)Information on Advanced Care Planning can be found at [facility] [place] Advance Health Care Directives Advance Health Care Directives. [email or web address]   Next Medicare Annual Wellness Visit scheduled for next year: Yes

## 2023-12-04 NOTE — Telephone Encounter (Signed)
 Verbal orders given. LS

## 2023-12-09 ENCOUNTER — Ambulatory Visit: Payer: Self-pay | Admitting: *Deleted

## 2023-12-09 ENCOUNTER — Other Ambulatory Visit: Payer: Self-pay | Admitting: *Deleted

## 2023-12-09 MED ORDER — APIXABAN 2.5 MG PO TABS: 2.5000 mg | ORAL_TABLET | Freq: Two times a day (BID) | ORAL | 1 refills | Status: DC

## 2023-12-09 NOTE — Patient Outreach (Signed)
 Care Coordination   12/09/2023 Name: Connor Bryant MRN: [id number] DOB: 25-Aug-1946   Care Coordination Outreach Attempts:  An unsuccessful outreach was attempted for an appointment today.  Follow Up Plan:  Additional outreach attempts will be made to offer the patient complex care management information and services.   Encounter Outcome:  Patient Visit Completed   Care Coordination Interventions:  Yes, provided     Allexus Ovens L. Noelle Penner, RN, BSN, CCM [place]  [facility], [facility] RN Care Manager Direct Dial: [phone]  Fax: [phone]

## 2023-12-09 NOTE — Telephone Encounter (Signed)
 Eliquis was changed to 2.5mg  BID while in the hospital but rx wasn't sent in. Pharmacy called requesting rx to reflect this. Ok to send in refill?

## 2023-12-09 NOTE — Patient Outreach (Signed)
 Care Coordination   Follow Up Visit Note   12/09/2023 Name: Connor Bryant MRN: [id number] DOB: 1946/07/04  HUSSAIN MAIMONE is a 78 y.o. year old male who sees Stacks, Broadus John, MD for primary care. I spoke with  [name] by phone today when he returned a call to RN CM via the [facility] office staff  What matters to the patients health and wellness today?  Home health, [facility], GI follow up after EGD, no worsening symptoms Pending start date of [facility] services. Last week staff visited to assess needs, form plan of care He has the contact numbers for Kennyth Arnold and Irving Burton  Still getting around with walker or quad cane  Son and or daughter in law visits daily, Friends call frequently - good supports system  congestive Heart Failure (CHF)- increase voiding, no swelling- wears compression hose -Wt 170 lbs  Diabetes- Cbg 138 this morning   Voiced understanding of warm transfer to new RN CM    Goals Addressed             This Visit's Progress    home mangagement of congestive Heart Failure (CHF) & Atrial fibrillation (Afib)- care management services   On track    12/09/23 denies worsening symptoms related to CHF, Atrail fibrillation    Interventions Today    Flowsheet Row Most Recent Value  Chronic Disease   Chronic disease during today's visit Diabetes, Congestive Heart Failure (CHF), Hypertension (HTN), Other  [mobility, swelling]  General Interventions   General Interventions Discussed/Reviewed General Interventions Reviewed, Doctor Visits  Doctor Visits Discussed/Reviewed Doctor Visits Reviewed, PCP, Specialist  Durable Medical Equipment (DME) Other  PCP/Specialist Visits Compliance with follow-up visit  Communication with PCP/Specialists, RN  Exercise Interventions   Exercise Discussed/Reviewed Exercise Reviewed, Physical Activity  Physical Activity Discussed/Reviewed Physical Activity Reviewed, Home Exercise Program (HEP)  [[facility]]  Weight Management Weight  maintenance  Education Interventions   Education Provided Provided Web-based Education, Provided Education  [periphral edema, home health visit processes, compression hose]  Provided Verbal Education On Nutrition, Mental Health/Coping with Illness, Other  Mental Health Interventions   Mental Health Discussed/Reviewed --  [encouraged celebration of upcoming birthday]  Nutrition Interventions   Nutrition Discussed/Reviewed --  [discussed protein drinks, eggs, cheese, peanut butter,]  Pharmacy Interventions   Pharmacy Dicussed/Reviewed Pharmacy Topics Reviewed, Medications and their functions, Affording Medications  Safety Interventions   Safety Discussed/Reviewed Safety Reviewed, Home Safety  Home Safety Assistive Devices              SDOH assessments and interventions completed:  No  SDOH Interventions Today    Flowsheet Row Most Recent Value  SDOH Interventions   Food Insecurity Interventions Intervention Not Indicated  Utilities Interventions Intervention Not Indicated  Financial Strain Interventions Intervention Not Indicated  Stress Interventions Intervention Not Indicated  Social Connections Interventions Intervention Not Indicated  Health Literacy Interventions Intervention Not Indicated        Care Coordination Interventions:  Yes, provided   Follow up plan: Follow up call scheduled for 01/09/24 by Danise Edge    Encounter Outcome:  Patient Visit Completed   Cala Bradford L. Noelle Penner, RN, BSN, CCM [place]  [facility], [facility] RN Care Manager Direct Dial: [phone]  Fax: [phone]

## 2023-12-09 NOTE — Patient Instructions (Addendum)
 Visit Information  Thank you for taking time to visit with me today. Please don't hesitate to contact me if I can be of assistance to you.    [facility] nurse case manager after April 2025 Danise Edge [phone]  Following are the goals we discussed today:   Goals Addressed             This Visit's Progress    home mangagement of congestive Heart Failure (CHF) & Atrial fibrillation (Afib)- care management services   On track    12/09/23 denies worsening symptoms related to CHF, Atrail fibrillation    Interventions Today    Flowsheet Row Most Recent Value  Chronic Disease   Chronic disease during today's visit Diabetes, Congestive Heart Failure (CHF), Hypertension (HTN), Other  [mobility, swelling]  General Interventions   General Interventions Discussed/Reviewed General Interventions Reviewed, Doctor Visits  Doctor Visits Discussed/Reviewed Doctor Visits Reviewed, PCP, Specialist  Durable Medical Equipment (DME) Other  PCP/Specialist Visits Compliance with follow-up visit  Communication with PCP/Specialists, RN  Exercise Interventions   Exercise Discussed/Reviewed Exercise Reviewed, Physical Activity  Physical Activity Discussed/Reviewed Physical Activity Reviewed, Home Exercise Program (HEP)  [[facility]]  Weight Management Weight maintenance  Education Interventions   Education Provided Provided Web-based Education, Provided Education  [periphral edema, home health visit processes, compression hose]  Provided Verbal Education On Nutrition, Mental Health/Coping with Illness, Other  Mental Health Interventions   Mental Health Discussed/Reviewed --  [encouraged celebration of upcoming birthday]  Nutrition Interventions   Nutrition Discussed/Reviewed --  [discussed protein drinks, eggs, cheese, peanut butter,]  Pharmacy Interventions   Pharmacy Dicussed/Reviewed Pharmacy Topics Reviewed, Medications and their functions, Affording Medications  Safety  Interventions   Safety Discussed/Reviewed Safety Reviewed, Home Safety  Home Safety Assistive Devices              Our next appointment is by telephone on 01/09/24  at 2 pm  Please call the care guide team at [phone] if you need to cancel or reschedule your appointment.   If you are experiencing a Mental Health or Behavioral Health Crisis or need someone to talk to, please call the Suicide and Crisis Lifeline: 988 call the [place] National Suicide Prevention Lifeline: (863)439-6957 or TTY: 620-046-2280 TTY (712) 280-5211) to talk to a trained counselor call 1-800-273-TALK (toll free, 24 hour hotline) call the [facility]: [phone] call 911   Patient verbalizes understanding of instructions and care plan provided today and agrees to view in MyChart. Active MyChart status and patient understanding of how to access instructions and care plan via MyChart confirmed with patient.     The patient has been provided with contact information for the care management team and has been advised to call with any health related questions or concerns.   Nishant Schrecengost L. Noelle Penner, RN, BSN, CCM [employer]  [facility], [facility] Health RN Care Manager Direct Dial: [phone]  Fax: [phone]

## 2023-12-11 ENCOUNTER — Encounter: Payer: Self-pay | Admitting: Gastroenterology

## 2023-12-13 DIAGNOSIS — D500 Iron deficiency anemia secondary to blood loss (chronic): Secondary | ICD-10-CM | POA: Diagnosis not present

## 2023-12-13 DIAGNOSIS — M109 Gout, unspecified: Secondary | ICD-10-CM | POA: Diagnosis not present

## 2023-12-13 DIAGNOSIS — I130 Hypertensive heart and chronic kidney disease with heart failure and stage 1 through stage 4 chronic kidney disease, or unspecified chronic kidney disease: Secondary | ICD-10-CM | POA: Diagnosis not present

## 2023-12-13 DIAGNOSIS — Z7901 Long term (current) use of anticoagulants: Secondary | ICD-10-CM | POA: Diagnosis not present

## 2023-12-13 DIAGNOSIS — D631 Anemia in chronic kidney disease: Secondary | ICD-10-CM | POA: Diagnosis not present

## 2023-12-13 DIAGNOSIS — I959 Hypotension, unspecified: Secondary | ICD-10-CM | POA: Diagnosis not present

## 2023-12-13 DIAGNOSIS — I252 Old myocardial infarction: Secondary | ICD-10-CM | POA: Diagnosis not present

## 2023-12-13 DIAGNOSIS — M4800 Spinal stenosis, site unspecified: Secondary | ICD-10-CM | POA: Diagnosis not present

## 2023-12-13 DIAGNOSIS — I480 Paroxysmal atrial fibrillation: Secondary | ICD-10-CM | POA: Diagnosis not present

## 2023-12-13 DIAGNOSIS — E785 Hyperlipidemia, unspecified: Secondary | ICD-10-CM | POA: Diagnosis not present

## 2023-12-13 DIAGNOSIS — E1122 Type 2 diabetes mellitus with diabetic chronic kidney disease: Secondary | ICD-10-CM | POA: Diagnosis not present

## 2023-12-13 DIAGNOSIS — I5023 Acute on chronic systolic (congestive) heart failure: Secondary | ICD-10-CM | POA: Diagnosis not present

## 2023-12-13 DIAGNOSIS — J9610 Chronic respiratory failure, unspecified whether with hypoxia or hypercapnia: Secondary | ICD-10-CM | POA: Diagnosis not present

## 2023-12-13 DIAGNOSIS — N1831 Chronic kidney disease, stage 3a: Secondary | ICD-10-CM | POA: Diagnosis not present

## 2023-12-13 DIAGNOSIS — Z955 Presence of coronary angioplasty implant and graft: Secondary | ICD-10-CM | POA: Diagnosis not present

## 2023-12-13 DIAGNOSIS — I2510 Atherosclerotic heart disease of native coronary artery without angina pectoris: Secondary | ICD-10-CM | POA: Diagnosis not present

## 2023-12-13 DIAGNOSIS — Z7984 Long term (current) use of oral hypoglycemic drugs: Secondary | ICD-10-CM | POA: Diagnosis not present

## 2023-12-13 DIAGNOSIS — M0579 Rheumatoid arthritis with rheumatoid factor of multiple sites without organ or systems involvement: Secondary | ICD-10-CM | POA: Diagnosis not present

## 2023-12-14 ENCOUNTER — Encounter (HOSPITAL_COMMUNITY): Payer: Self-pay

## 2023-12-14 ENCOUNTER — Observation Stay (HOSPITAL_COMMUNITY)
Admission: EM | Admit: 2023-12-14 | Discharge: 2023-12-16 | Disposition: A | Discharge status: Home / Self Care | Attending: Internal Medicine | Admitting: Internal Medicine

## 2023-12-14 ENCOUNTER — Other Ambulatory Visit: Payer: Self-pay

## 2023-12-14 DIAGNOSIS — E119 Type 2 diabetes mellitus without complications: Secondary | ICD-10-CM

## 2023-12-14 DIAGNOSIS — I5022 Chronic systolic (congestive) heart failure: Secondary | ICD-10-CM | POA: Insufficient documentation

## 2023-12-14 DIAGNOSIS — Z8673 Personal history of transient ischemic attack (TIA), and cerebral infarction without residual deficits: Secondary | ICD-10-CM | POA: Insufficient documentation

## 2023-12-14 DIAGNOSIS — Z7984 Long term (current) use of oral hypoglycemic drugs: Secondary | ICD-10-CM | POA: Diagnosis not present

## 2023-12-14 DIAGNOSIS — I951 Orthostatic hypotension: Secondary | ICD-10-CM | POA: Insufficient documentation

## 2023-12-14 DIAGNOSIS — Z87891 Personal history of nicotine dependence: Secondary | ICD-10-CM | POA: Insufficient documentation

## 2023-12-14 DIAGNOSIS — N179 Acute kidney failure, unspecified: Secondary | ICD-10-CM | POA: Diagnosis not present

## 2023-12-14 DIAGNOSIS — I499 Cardiac arrhythmia, unspecified: Secondary | ICD-10-CM | POA: Diagnosis not present

## 2023-12-14 DIAGNOSIS — R6889 Other general symptoms and signs: Secondary | ICD-10-CM | POA: Diagnosis not present

## 2023-12-14 DIAGNOSIS — E860 Dehydration: Secondary | ICD-10-CM | POA: Diagnosis not present

## 2023-12-14 DIAGNOSIS — I480 Paroxysmal atrial fibrillation: Secondary | ICD-10-CM | POA: Diagnosis present

## 2023-12-14 DIAGNOSIS — I110 Hypertensive heart disease with heart failure: Secondary | ICD-10-CM | POA: Insufficient documentation

## 2023-12-14 DIAGNOSIS — Z79899 Other long term (current) drug therapy: Secondary | ICD-10-CM | POA: Insufficient documentation

## 2023-12-14 DIAGNOSIS — R531 Weakness: Secondary | ICD-10-CM | POA: Diagnosis present

## 2023-12-14 DIAGNOSIS — I10 Essential (primary) hypertension: Secondary | ICD-10-CM | POA: Diagnosis present

## 2023-12-14 DIAGNOSIS — R001 Bradycardia, unspecified: Secondary | ICD-10-CM | POA: Diagnosis not present

## 2023-12-14 DIAGNOSIS — I5020 Unspecified systolic (congestive) heart failure: Secondary | ICD-10-CM | POA: Diagnosis present

## 2023-12-14 DIAGNOSIS — Z743 Need for continuous supervision: Secondary | ICD-10-CM | POA: Diagnosis not present

## 2023-12-14 DIAGNOSIS — R404 Transient alteration of awareness: Secondary | ICD-10-CM | POA: Diagnosis not present

## 2023-12-14 LAB — BASIC METABOLIC PANEL
Anion gap: 16 — ABNORMAL HIGH (ref 5–15)
BUN: 29 mg/dL — ABNORMAL HIGH (ref 8–23)
CO2: 23 mmol/L (ref 22–32)
Calcium: 9.1 mg/dL (ref 8.9–10.3)
Chloride: 98 mmol/L (ref 98–111)
Creatinine, Ser: 2.16 mg/dL — ABNORMAL HIGH (ref 0.61–1.24)
GFR, Estimated: 30 mL/min — ABNORMAL LOW (ref >60)
Glucose, Bld: 158 mg/dL — ABNORMAL HIGH (ref 70–99)
Potassium: 3.6 mmol/L (ref 3.5–5.1)
Sodium: 137 mmol/L (ref 135–145)

## 2023-12-14 LAB — CBC
HCT: 32.3 % — ABNORMAL LOW (ref 39.0–52.0)
Hemoglobin: 10.3 g/dL — ABNORMAL LOW (ref 13.0–17.0)
MCH: 28.9 pg (ref 26.0–34.0)
MCHC: 31.9 g/dL (ref 30.0–36.0)
MCV: 90.7 fL (ref 80.0–100.0)
Platelets: 256 10*3/uL (ref 150–400)
RBC: 3.56 MIL/uL — ABNORMAL LOW (ref 4.22–5.81)
RDW: 16.4 % — ABNORMAL HIGH (ref 11.5–15.5)
WBC: 6 10*3/uL (ref 4.0–10.5)
nRBC: 0 % (ref 0.0–0.2)

## 2023-12-14 LAB — CBG MONITORING, ED: Glucose-Capillary: 135 mg/dL — ABNORMAL HIGH (ref 70–99)

## 2023-12-14 MED ORDER — PANTOPRAZOLE SODIUM 40 MG PO TBEC
40.0000 mg | DELAYED_RELEASE_TABLET | Freq: Two times a day (BID) | ORAL | Status: DC
  Administered 2023-12-14 – 2023-12-16 (×4): 40 mg via ORAL
  Filled 2023-12-14 (×5): qty 1

## 2023-12-14 MED ORDER — APIXABAN 2.5 MG PO TABS
2.5000 mg | ORAL_TABLET | Freq: Two times a day (BID) | ORAL | Status: DC
Start: 2023-12-14 — End: 2023-12-16
  Administered 2023-12-14 – 2023-12-16 (×4): 2.5 mg via ORAL
  Filled 2023-12-14 (×5): qty 1

## 2023-12-14 MED ORDER — LACTATED RINGERS IV SOLN
INTRAVENOUS | Status: AC
Start: 2023-12-14 — End: 2023-12-15

## 2023-12-14 MED ORDER — ATORVASTATIN CALCIUM 40 MG PO TABS
80.0000 mg | ORAL_TABLET | Freq: Every day | ORAL | Status: DC
  Administered 2023-12-15 – 2023-12-16 (×2): 80 mg via ORAL
  Filled 2023-12-14 (×3): qty 2

## 2023-12-14 MED ORDER — LACTATED RINGERS IV BOLUS
500.0000 mL | Freq: Once | INTRAVENOUS | Status: AC
  Administered 2023-12-14: 500 mL via INTRAVENOUS

## 2023-12-14 MED ORDER — DILTIAZEM HCL ER COATED BEADS 180 MG PO CP24
180.0000 mg | ORAL_CAPSULE | Freq: Every day | ORAL | Status: DC
  Administered 2023-12-15: 180 mg via ORAL
  Filled 2023-12-14: qty 1

## 2023-12-14 MED ORDER — CARVEDILOL 12.5 MG PO TABS
25.0000 mg | ORAL_TABLET | Freq: Two times a day (BID) | ORAL | Status: DC
  Administered 2023-12-14 – 2023-12-15 (×2): 25 mg via ORAL
  Filled 2023-12-14 (×2): qty 2

## 2023-12-14 NOTE — ED Triage Notes (Signed)
 Pt BIB ems for orthostatic hypotension/ weakness. Per EMS pt was seen around the beginning of the month and diagnosed with FLU. Today pt c/o weakness and when EMS did orthostatics on him states he dropped 50 points systolic.

## 2023-12-14 NOTE — ED Provider Notes (Signed)
 [facility] EMERGENCY DEPARTMENT AT [facility] Provider Note   CSN: [id number] Arrival date & time: 12/14/23  1124     History  Chief Complaint  Patient presents with   Weakness    Connor Bryant is a 78 y.o. male.  78 year old male with past medical history of systolic CHF presenting to the emergency department today with lightheadedness.  The patient states that over the past few days that he has been having lightheadedness mostly with standing.  He states that he did have a few episodes yesterday while seated.  He denies any associated chest pain.  He states that this is a left worse when he changes position.  He denies starting any new medications recently.  He is on torsemide as well as spironolactone.  He has been taking these as prescribed.  Denies any vomiting or diarrhea.   Weakness      Home Medications Prior to Admission medications   Medication Sig Start Date End Date Taking? Authorizing Provider  Accu-Chek Softclix Lancets lancets SMARTSIG:Topical 1-3 Times Daily 06/25/23   [provider]  acetaminophen (TYLENOL) 325 MG tablet Take 2 tablets (650 mg total) by mouth every 4 (four) hours as needed for headache or mild pain (pain score 1-3). 09/27/23   Lee, [place], NP  allopurinol (ZYLOPRIM) 100 MG tablet TAKE 1 TABLET 2 TO 3 TIMES A DAY FOR GOUT 11/19/23   Mechele Claude, MD  apixaban (ELIQUIS) 2.5 MG TABS tablet Take 1 tablet (2.5 mg total) by mouth 2 (two) times daily. 12/09/23   Mechele Claude, MD  atorvastatin (LIPITOR) 80 MG tablet Take 1 tablet (80 mg total) by mouth daily. 10/15/23   Mechele Claude, MD  Blood Glucose Monitoring Suppl (ACCU-CHEK GUIDE) w/Device KIT CHECK BLOOD SUGAR UP TO THREE TIMES DAILY AS DIRECTED 06/25/23   [provider]  Blood Glucose Monitoring Suppl DEVI 1 each by Does not apply route in the morning, at noon, and at bedtime. May substitute to any manufacturer covered by patient's insurance. 06/25/23   Mechele Claude,  MD  Calcium Carb-Cholecalciferol (CALCIUM 500 + D PO) Take 1 tablet by mouth daily.    [provider]  carvedilol (COREG) 25 MG tablet Take 1 tablet (25 mg total) by mouth 2 (two) times daily. 12/02/23 01/01/24  Dorcas Carrow, MD  clopidogrel (PLAVIX) 75 MG tablet Take 1 tablet (75 mg total) by mouth daily with breakfast. Patient not taking: Reported on 12/04/2023 09/28/23   Lee, [place], NP  diltiazem (CARDIZEM CD) 180 MG 24 hr capsule Take 1 capsule (180 mg total) by mouth daily. 12/03/23 01/02/24  Dorcas Carrow, MD  FARXIGA 10 MG TABS tablet Take 10 mg by mouth every morning. 11/05/23   [provider]  ferrous sulfate 325 (65 FE) MG tablet Take 1 tablet (325 mg total) by mouth daily. 09/28/23   Lee, [place], NP  pantoprazole (PROTONIX) 40 MG tablet TAKE ONE TABLET TWICE DAILY 11/21/23   Mechele Claude, MD  potassium chloride SA (KLOR-CON M) 20 MEQ tablet Take 1 tablet (20 mEq total) by mouth daily. 12/03/23 01/02/24  Dorcas Carrow, MD  spironolactone (ALDACTONE) 25 MG tablet Take 0.5 tablets (12.5 mg total) by mouth daily. 09/03/23   Laurey Morale, MD  torsemide 40 MG TABS Take 40 mg by mouth daily. 12/03/23 01/02/24  Dorcas Carrow, MD      Allergies    Patient has no known allergies.    Review of Systems   Review of Systems  Neurological:  Positive for weakness and light-headedness.    Physical Exam Updated Vital Signs BP (!) 152/59 (BP Location: Right Arm)   Pulse (!) 55   Resp 16   Ht 5\' 8"  (1.727 m)   Wt 77.1 kg   SpO2 100%   BMI 25.85 kg/m  Physical Exam Vitals and nursing note reviewed.   Gen: NAD Eyes: PERRL, EOMI HEENT: no oropharyngeal swelling Neck: trachea midline Resp: clear to auscultation bilaterally Card: RRR, no murmurs, rubs, or gallops Abd: nontender, nondistended Extremities: no calf tenderness, no edema Vascular: 2+ radial pulses bilaterally, 2+ DP pulses bilaterally Neuro: No focal deficits Skin: no rashes Psyc: acting  appropriately   ED Results / Procedures / Treatments   Labs (all labs ordered are listed, but only abnormal results are displayed) Labs Reviewed  BASIC METABOLIC PANEL - Abnormal; Notable for the following components:      Result Value   Glucose, Bld 158 (*)    BUN 29 (*)    Creatinine, Ser 2.16 (*)    GFR, Estimated 30 (*)    Anion gap 16 (*)    All other components within normal limits  CBC - Abnormal; Notable for the following components:   RBC 3.56 (*)    Hemoglobin 10.3 (*)    HCT 32.3 (*)    RDW 16.4 (*)    All other components within normal limits  CBG MONITORING, ED - Abnormal; Notable for the following components:   Glucose-Capillary 135 (*)    All other components within normal limits  URINALYSIS, ROUTINE W REFLEX MICROSCOPIC    EKG EKG Interpretation Date/Time:  Saturday December 14 2023 11:39:46 EDT Ventricular Rate:  56 PR Interval:  244 QRS Duration:  115 QT Interval:  551 QTC Calculation: 532 R Axis:   -15  Text Interpretation: Sinus rhythm Prolonged PR interval Incomplete right bundle branch block Anterior infarct, age indeterminate Confirmed by Beckey Downing [phone]) on 12/14/2023 12:01:58 PM  Radiology No results found.  Procedures Procedures    Medications Ordered in ED Medications  lactated ringers bolus 500 mL (0 mLs Intravenous Stopped 12/14/23 1316)    ED Course/ Medical Decision Making/ A&P                                 Medical Decision Making 78 year old male with past medical history of systolic CHF and hyperlipidemia presenting to the emergency department today with lightheadedness.  This does seem to be orthostatic mostly in character.  I will further evaluate patient here with basic labs to out for anemia or electrolyte abnormalities.  Will keep him on the monitor to evaluate for arrhythmias.  Will obtain EKG to evaluate for conduction abnormalities.  I will reevaluate for ultimate disposition.  Based on description of his symptoms  suspicion for pulmonary embolism is low at this time.  The patient is satting 100% on room air.  He does not have any findings consistent with volume overload here on exam.  I will give the patient a small IV fluid bolus and reevaluate for ultimate disposition.  The patient was found to have significant AKI here with a creatinine greater than 2 from baseline of less than 1.  Remainder of his labs are reassuring.  Calls placed to the hospitalist service for admission.  Amount and/or Complexity of Data Reviewed Labs: ordered.  Risk Decision regarding hospitalization.           Final Clinical Impression(s) /  ED Diagnoses Final diagnoses:  AKI (acute kidney injury) (HCC)  Orthostatic hypotension    Rx / DC Orders ED Discharge Orders     None         Durwin Glaze, MD 12/14/23 1443

## 2023-12-14 NOTE — ED Notes (Signed)
 ED TO INPATIENT HANDOFF REPORT  ED Nurse Name and Phone #: Heather Mckendree RN   S Name/Age/Gender Connor Bryant 78 y.o. male Room/Bed: APA03/APA03  Code Status   Code Status: Prior  Home/SNF/Other Home Patient oriented to: self, place, time, and situation Is this baseline? Yes   Triage Complete: Triage complete  Chief Complaint AKI (acute kidney injury) (HCC) [N17.9]  Triage Note Pt BIB ems for orthostatic hypotension/ weakness. Per EMS pt was seen around the beginning of the month and diagnosed with FLU. Today pt c/o weakness and when EMS did orthostatics on him states he dropped 50 points systolic.    Allergies No Known Allergies  Level of Care/Admitting Diagnosis ED Disposition     ED Disposition  Admit   Condition  --   Comment  Hospital Area: [facility] [[id number]]  Level of Care: Telemetry [5]  Covid Evaluation: Asymptomatic - no recent exposure (last 10 days) testing not required  Diagnosis: AKI (acute kidney injury) [facility]) [[id number]]  Admitting Physician: Levie Heritage [[id number]]  Attending Physician: Levie Heritage [[id number]]          B Medical/Surgery History Past Medical History:  Diagnosis Date   Acute CVA (cerebrovascular accident) (HCC) 05/01/2020   Acute respiratory failure with hypoxia (HCC) 05/04/2020   Arthritis    Atrial fibrillation (HCC)    DM type 2 (diabetes mellitus, type 2) (HCC)    no meds   Endotracheally intubated    Gallstone    Heart murmur    was told one time he had a murmur, not since 30 years ago   HTN (hypertension)    Hyperlipidemia    Myocardial infarct, old    2021   Personal history of colonic polyp-adenoma 11/24/2013   Renal insufficiency    mild   Thoracic spine fracture [facility])    Past Surgical History:  Procedure Laterality Date   ABDOMINAL AORTAGRAM N/A 02/23/2014   Procedure: ABDOMINAL Ronny Flurry;  Surgeon: Fransisco Hertz, MD;  Location: [facility] CATH LAB;  Service: Cardiovascular;  Laterality: N/A;   BIOPSY   08/20/2023   Procedure: BIOPSY;  Surgeon: Imogene Burn, MD;  Location: [facility] ENDOSCOPY;  Service: Gastroenterology;;   CATARACT EXTRACTION, BILATERAL     COLONOSCOPY     CORONARY ATHERECTOMY N/A 09/16/2023   Procedure: CORONARY ATHERECTOMY;  Surgeon: Swaziland, Peter M, MD;  Location: [facility] INVASIVE CV LAB;  Service: Cardiovascular;  Laterality: N/A;   CORONARY LITHOTRIPSY N/A 09/16/2023   Procedure: CORONARY LITHOTRIPSY;  Surgeon: [place]az[place], Peter M, MD;  Location: [facility] INVASIVE CV LAB;  Service: Cardiovascular;  Laterality: N/A;   CORONARY STENT INTERVENTION N/A 09/16/2023   Procedure: CORONARY STENT INTERVENTION;  Surgeon: Swaziland, Peter M, MD;  Location: [facility] INVASIVE CV LAB;  Service: Cardiovascular;  Laterality: N/A;   CORONARY ULTRASOUND/IVUS N/A 09/16/2023   Procedure: Coronary Ultrasound/IVUS;  Surgeon: Swazil[place], Peter M, MD;  Location: [facility] INVASIVE CV LAB;  Service: Cardiovascular;  Laterality: N/A;   DEBRIDEMENT TENNIS ELBOW Right    ESOPHAGOGASTRODUODENOSCOPY     ESOPHAGOGASTRODUODENOSCOPY N/A 11/30/2023   Procedure: EGD (ESOPHAGOGASTRODUODENOSCOPY);  Surgeon: Marguerita Merles, Reuel Boom, MD;  [facility]O [facility];  Service: Gastroenterology;  Laterality: N/A;   ESOPHAGOGASTRODUODENOSCOPY (EGD) WITH PROPOFOL N/A 08/20/2023   Procedure: ESOPHAGOGASTRODUODENOSCOPY (EGD) WITH PROPOFOL;  Surgeon: Imogene Burn, MD;  Location: [facility] ENDOSCOPY;  Service: Gastroenterology;  Laterality: N/A;   MESENTERIC ARTERY BYPASS N/A 03/10/2014   Procedure: AORTO TO SUPERIOR MESENTERIC ARTERY BYPASS;  Surgeon: Pryor Ochoa, MD;  Location: [facility]  OR;  Service: Vascular;  Laterality: N/A;   RIGHT/LEFT HEART CATH AND CORONARY ANGIOGRAPHY N/A 09/11/2023   Procedure: RIGHT/LEFT HEART CATH AND CORONARY ANGIOGRAPHY;  Surgeon: Laurey Morale, MD;  Location: [facility] INVASIVE CV LAB;  Service: Cardiovascular;  Laterality: N/A;   TONSILLECTOMY AND ADENOIDECTOMY     vascualr surgery     illiac stents     A IV  Location/Drains/Wounds Patient Lines/Drains/Airways Status     Active Line/Drains/Airways     Name Placement date Placement time Site Days   Peripheral IV 12/14/23 20 G 1" Anterior;Proximal;Right Forearm 12/14/23  1127  Forearm  less than 1            Intake/Output Last 24 hours  Intake/Output Summary (Last 24 hours) at 12/14/2023 1547 Last data filed at 12/14/2023 1316 Gross per 24 hour  Intake 500 ml  Output --  Net 500 ml    Labs/Imaging Results for orders placed or performed during the hospital encounter of 12/14/23 (from the past 48 hours)  Basic metabolic panel     Status: Abnormal   Collection Time: 12/14/23 11:42 AM  Result Value Ref Range   Sodium 137 135 - 145 mmol/L   Potassium 3.6 3.5 - 5.1 mmol/L   Chloride 98 98 - 111 mmol/L   CO2 23 22 - 32 mmol/L   Glucose, Bld 158 (H) 70 - 99 mg/dL    Comment: Glucose reference range applies only to samples taken after fasting for at least 8 hours.   BUN 29 (H) 8 - 23 mg/dL   Creatinine, Ser 1.61 (H) 0.61 - 1.24 mg/dL   Calcium 9.1 8.9 - 09.6 mg/dL   GFR, Estimated 30 (L) >60 mL/min    Comment: (NOTE) Calculated using the CKD-EPI Creatinine Equation (2021)    Anion gap 16 (H) 5 - 15    Comment: Performed at [facility], [address], [place], [place] [address]  CBC     Status: Abnormal   Collection Time: 12/14/23 11:42 AM  Result Value Ref Range   WBC 6.0 4.0 - 10.5 K/uL   RBC 3.56 (L) 4.22 - 5.81 MIL/uL   Hemoglobin 10.3 (L) 13.0 - 17.0 g/dL   HCT 98.1 (L) 19.1 - 47.8 %   MCV 90.7 80.0 - 100.0 fL   MCH 28.9 26.0 - 34.0 pg   MCHC 31.9 30.0 - 36.0 g/dL   RDW 29.5 (H) 62.1 - 30.8 %   Platelets 256 150 - 400 K/uL   nRBC 0.0 0.0 - 0.2 %    Comment: Performed at [facility], [address], [place], [place] [address]  CBG monitoring, ED     Status: Abnormal   Collection Time: 12/14/23 11:45 AM  Result Value Ref Range   Glucose-Capillary 135 (H) 70 - 99 mg/dL    Comment: Glucose reference range applies only to  samples taken after fasting for at least 8 hours.   No results found.  Pending Labs Unresulted Labs (From admission, onward)     Start     Ordered   12/14/23 1142  Urinalysis, Routine w reflex microscopic -Urine, Clean Catch  Once,   URGENT       Question:  Specimen Source  Answer:  Urine, Clean Catch   12/14/23 1141            Vitals/Pain Today's Vitals   12/14/23 1140 12/14/23 1142 12/14/23 1345 12/14/23 1400  BP:  (!) 152/59 (!) 149/58 114/65  Pulse:  (!) 55 (!) 53 62  Resp:  16 15 (!) 21  SpO2:  100% 100% 100%  Weight: 170 lb (77.1 kg)     Height: 5\' 8"  (1.727 m)     PainSc: 0-No pain       Isolation Precautions No active isolations  Medications Medications  lactated ringers infusion (has no administration in time range)  lactated ringers bolus 500 mL (0 mLs Intravenous Stopped 12/14/23 1316)    Mobility walks with person assist     Focused Assessments Cardiac Assessment Handoff:  Cardiac Rhythm: Sinus bradycardia Lab Results  Component Value Date   CKTOTAL 118 11/26/2023   Lab Results  Component Value Date   DDIMER 3.83 (H) 05/03/2020   Does the Patient currently have chest pain? No    R Recommendations: See Admitting Provider Note  Report given to:   Additional Notes:

## 2023-12-14 NOTE — H&P (Signed)
 History and Physical    Patient: Connor Bryant AOZ:[id number] DOB: 04/30/1946 DOA: 12/14/2023 DOS: the patient was seen and examined on 12/14/2023 PCP: Mechele Claude, MD  Patient coming from: Home  Chief Complaint:  Chief Complaint  Patient presents with   Weakness   HPI: Connor Bryant is a 78 y.o. male with medical history significant of history of atrial fibrillation on Eliquis, diabetes, hypertension, history of MI, history of CVA.  Patient was recently hospitalized due to pneumonia with flu.  In addition, he overload and was transition from Lasix to torsemide and was discharged on 40 mg daily.  He has been taking his medications frequently, but noticed that he has been getting progressively dizzy especially with transitioning from sitting to standing.  Last night he tried to take his diuretics at night to see if this would help, however he spent most of the night getting up to pee every 90 minutes or so and this morning became so dizzy that he felt like he was going to pass out.  His family brought him here for evaluation.  Review of Systems: As mentioned in the history of present illness. All other systems reviewed and are negative. Past Medical History:  Diagnosis Date   Acute CVA (cerebrovascular accident) (HCC) 05/01/2020   Acute respiratory failure with hypoxia (HCC) 05/04/2020   Arthritis    Atrial fibrillation (HCC)    DM type 2 (diabetes mellitus, type 2) (HCC)    no meds   Endotracheally intubated    Gallstone    Heart murmur    was told one time he had a murmur, not since 30 years ago   HTN (hypertension)    Hyperlipidemia    Myocardial infarct, old    2021   Personal history of colonic polyp-adenoma 11/24/2013   Renal insufficiency    mild   Thoracic spine fracture [facility])    Past Surgical History:  Procedure Laterality Date   ABDOMINAL AORTAGRAM N/A 02/23/2014   Procedure: ABDOMINAL Ronny Flurry;  Surgeon: Fransisco Hertz, MD;  Location: [facility] CATH LAB;  Service:  Cardiovascular;  Laterality: N/A;   BIOPSY  08/20/2023   Procedure: BIOPSY;  Surgeon: Imogene Burn, MD;  Location: [facility] ENDOSCOPY;  Service: Gastroenterology;;   CATARACT EXTRACTION, BILATERAL     COLONOSCOPY     CORONARY ATHERECTOMY N/A 09/16/2023   Procedure: CORONARY ATHERECTOMY;  Surgeon: Swaziland, Peter M, MD;  Location: [facility] INVASIVE CV LAB;  Service: Cardiovascular;  Laterality: N/A;   CORONARY LITHOTRIPSY N/A 09/16/2023   Procedure: CORONARY LITHOTRIPSY;  Surgeon: Swaziland, Peter M, MD;  Location: [facility] INVASIVE CV LAB;  Service: Cardiovascular;  Laterality: N/A;   CORONARY STENT INTERVENTION N/A 09/16/2023   Procedure: CORONARY STENT INTERVENTION;  Surgeon: Swaziland, Peter M, MD;  Location: [facility] INVASIVE CV LAB;  Service: Cardiovascular;  Laterality: N/A;   CORONARY ULTRASOUND/IVUS N/A 09/16/2023   Procedure: Coronary Ultrasound/IVUS;  Surgeon: Swaziland, Peter M, MD;  Location: [facility] INVASIVE CV LAB;  Service: Cardiovascular;  Laterality: N/A;   DEBRIDEMENT TENNIS ELBOW Right    ESOPHAGOGASTRODUODENOSCOPY     ESOPHAGOGASTRODUODENOSCOPY N/A 11/30/2023   Procedure: EGD (ESOPHAGOGASTRODUODENOSCOPY);  Surgeon: Marguerita Merles, Reuel Boom, MD;  Location: [facility] ENDO SUITE;  Service: Gastroenterology;  Laterality: N/A;   ESOPHAGOGASTRODUODENOSCOPY (EGD) WITH PROPOFOL N/A 08/20/2023   Procedure: ESOPHAGOGASTRODUODENOSCOPY (EGD) WITH PROPOFOL;  Surgeon: Imogene Burn, MD;  Location: [facility] ENDOSCOPY;  Service: Gastroenterology;  Laterality: N/A;   MESENTERIC ARTERY BYPASS N/A 03/10/2014   Procedure: AORTO TO SUPERIOR MESENTERIC ARTERY BYPASS;  Surgeon: Pryor Ochoa, MD;  Location: [facility] OR;  Service: Vascular;  Laterality: N/A;   RIGHT/LEFT HEART CATH AND CORONARY ANGIOGRAPHY N/A 09/11/2023   Procedure: RIGHT/LEFT HEART CATH AND CORONARY ANGIOGRAPHY;  Surgeon: Laurey Morale, MD;  Location: [facility] INVASIVE CV LAB;  Service: Cardiovascular;  Laterality: N/A;   TONSILLECTOMY AND ADENOIDECTOMY     vascualr surgery     illiac  stents   Social History:  reports that he quit smoking about 14 years ago. His smoking use included cigarettes. He has been exposed to tobacco smoke. He has never used smokeless tobacco. He reports that he does not currently use alcohol. He reports that he does not currently use drugs.  No Known Allergies  Family History  Problem Relation Age of Onset   Diabetes Mother    Heart disease Mother        before age 68   Hyperlipidemia Mother    Hypertension Mother    AAA (abdominal aortic aneurysm) Mother    Stroke Father    Hypertension Father    Coronary artery disease Father    Hyperlipidemia Father    Diabetes Father    Bladder Cancer Father    Cancer Father    Heart disease Father    Heart attack Father    Colon cancer Neg Hx    Throat cancer Neg Hx    Kidney disease Neg Hx    Liver disease Neg Hx     Prior to Admission medications   Medication Sig Start Date End Date Taking? Authorizing Provider  allopurinol (ZYLOPRIM) 100 MG tablet TAKE 1 TABLET 2 TO 3 TIMES A DAY FOR GOUT 11/19/23  Yes Mechele Claude, MD  apixaban (ELIQUIS) 2.5 MG TABS tablet Take 1 tablet (2.5 mg total) by mouth 2 (two) times daily. 12/09/23  Yes Mechele Claude, MD  atorvastatin (LIPITOR) 80 MG tablet Take 1 tablet (80 mg total) by mouth daily. 10/15/23  Yes Mechele Claude, MD  Calcium Carb-Cholecalciferol (CALCIUM 500 + D PO) Take 1 tablet by mouth daily.   Yes [provider]  carvedilol (COREG) 25 MG tablet Take 1 tablet (25 mg total) by mouth 2 (two) times daily. 12/02/23 01/01/24 Yes Dorcas Carrow, MD  diltiazem (CARDIZEM CD) 180 MG 24 hr capsule Take 1 capsule (180 mg total) by mouth daily. 12/03/23 01/02/24 Yes Ghimire, Lyndel Safe, MD  FARXIGA 10 MG TABS tablet Take 10 mg by mouth every morning. 11/05/23  Yes [provider]  ferrous sulfate 325 (65 FE) MG tablet Take 1 tablet (325 mg total) by mouth daily. 09/28/23  Yes Lee, [place], NP  pantoprazole (PROTONIX) 40 MG tablet TAKE ONE TABLET TWICE  DAILY 11/21/23  Yes Stacks, Broadus John, MD  potassium chloride SA (KLOR-CON M) 20 MEQ tablet Take 1 tablet (20 mEq total) by mouth daily. 12/03/23 01/02/24 Yes Dorcas Carrow, MD  spironolactone (ALDACTONE) 25 MG tablet Take 0.5 tablets (12.5 mg total) by mouth daily. 09/03/23  Yes Laurey Morale, MD  torsemide (DEMADEX) 20 MG tablet Take 40 mg by mouth daily. 12/02/23  Yes [provider]  Accu-Chek Softclix Lancets lancets SMARTSIG:Topical 1-3 Times Daily 06/25/23   [provider]  Blood Glucose Monitoring Suppl (ACCU-CHEK GUIDE) w/Device KIT CHECK BLOOD SUGAR UP TO THREE TIMES DAILY AS DIRECTED 06/25/23   [provider]  Blood Glucose Monitoring Suppl DEVI 1 each by Does not apply route in the morning, at noon, and at bedtime. May substitute to any manufacturer covered by patient's insurance. 06/25/23  Mechele Claude, MD  clopidogrel (PLAVIX) 75 MG tablet Take 1 tablet (75 mg total) by mouth daily with breakfast. 09/28/23   Lee, [place], NP    Physical Exam: Vitals:   12/14/23 1140 12/14/23 1142 12/14/23 1345 12/14/23 1400  BP:  (!) 152/59 (!) 149/58 114/65  Pulse:  (!) 55 (!) 53 62  Resp:  16 15 (!) 21  SpO2:  100% 100% 100%  Weight: 77.1 kg     Height: 5\' 8"  (1.727 m)      General: Elderly male. Awake and alert and oriented x3. No acute cardiopulmonary distress.  HEENT: Normocephalic atraumatic.  Right and left ears normal in appearance.  Pupils equal, round, reactive to light. Extraocular muscles are intact. Sclerae anicteric and noninjected.  Moist mucosal membranes. No mucosal lesions.  Neck: Neck supple without lymphadenopathy. No carotid bruits. No masses palpated.  Cardiovascular: Regular rate with normal S1-S2 sounds. No murmurs, rubs, gallops auscultated. No JVD.  Respiratory: Good respiratory effort with no wheezes, rales, rhonchi. Lungs clear to auscultation bilaterally.  No accessory muscle use. Abdomen: Soft, nontender, nondistended. Active bowel sounds. No  masses or hepatosplenomegaly  Skin: No rashes, lesions, or ulcerations.  Dry, warm to touch. 2+ dorsalis pedis and radial pulses. Musculoskeletal: No calf or leg pain. All major joints not erythematous nontender.  No upper or lower joint deformation.  Good ROM.  No contractures  Psychiatric: Intact judgment and insight. Pleasant and cooperative. Neurologic: No focal neurological deficits. Strength is 5/5 and symmetric in upper and lower extremities.  Cranial nerves II through XII are grossly intact.  Data Reviewed: Results for orders placed or performed during the hospital encounter of 12/14/23 (from the past 24 hours)  Basic metabolic panel     Status: Abnormal   Collection Time: 12/14/23 11:42 AM  Result Value Ref Range   Sodium 137 135 - 145 mmol/L   Potassium 3.6 3.5 - 5.1 mmol/L   Chloride 98 98 - 111 mmol/L   CO2 23 22 - 32 mmol/L   Glucose, Bld 158 (H) 70 - 99 mg/dL   BUN 29 (H) 8 - 23 mg/dL   Creatinine, Ser 1.61 (H) 0.61 - 1.24 mg/dL   Calcium 9.1 8.9 - 09.6 mg/dL   GFR, Estimated 30 (L) >60 mL/min   Anion gap 16 (H) 5 - 15  CBC     Status: Abnormal   Collection Time: 12/14/23 11:42 AM  Result Value Ref Range   WBC 6.0 4.0 - 10.5 K/uL   RBC 3.56 (L) 4.22 - 5.81 MIL/uL   Hemoglobin 10.3 (L) 13.0 - 17.0 g/dL   HCT 04.5 (L) 40.9 - 81.1 %   MCV 90.7 80.0 - 100.0 fL   MCH 28.9 26.0 - 34.0 pg   MCHC 31.9 30.0 - 36.0 g/dL   RDW 91.4 (H) 78.2 - 95.6 %   Platelets 256 150 - 400 K/uL   nRBC 0.0 0.0 - 0.2 %  CBG monitoring, ED     Status: Abnormal   Collection Time: 12/14/23 11:45 AM  Result Value Ref Range   Glucose-Capillary 135 (H) 70 - 99 mg/dL    No results found.   Assessment and Plan: No notes have been filed under this hospital service. Service: Hospitalist  Principal Problem:   AKI (acute kidney injury) (HCC) Active Problems:   Essential hypertension   Type 2 diabetes mellitus without complication, without long-term current use of insulin (HCC)   PAF  (paroxysmal atrial fibrillation) (HCC)   Diabetes mellitus treated  with oral medication (HCC)   HFrEF (heart failure with reduced ejection fraction) (HCC)  AKI Gentle IV fluids Hold diuretics Recheck creatinine in the morning Orthostasis Gentle IV hydration Check orthostatic vital signs tomorrow morning Hypertension Continue antihypertensives Diabetes  Paroxysmal atrial fibrillation Continue Cardizem and Eliquis Heart failure with reduced EF Will likely need torsemide reduced in half   Advance Care Planning:   Code Status: Full Code confirmed by patient  Consults: None  Family Communication: Son present during interview and exam  Severity of Illness: The appropriate patient status for this patient is OBSERVATION. Observation status is judged to be reasonable and necessary in order to provide the required intensity of service to ensure the patient's safety. The patient's presenting symptoms, physical exam findings, and initial radiographic and laboratory data in the context of their medical condition is felt to place them at decreased risk for further clinical deterioration. Furthermore, it is anticipated that the patient will be medically stable for discharge from the hospital within 2 midnights of admission.   Author: Levie Heritage, DO 12/14/2023 3:54 PM  For on call review [email or web address].

## 2023-12-15 DIAGNOSIS — I5022 Chronic systolic (congestive) heart failure: Secondary | ICD-10-CM | POA: Diagnosis not present

## 2023-12-15 DIAGNOSIS — E860 Dehydration: Secondary | ICD-10-CM | POA: Diagnosis not present

## 2023-12-15 DIAGNOSIS — I110 Hypertensive heart disease with heart failure: Secondary | ICD-10-CM | POA: Diagnosis not present

## 2023-12-15 DIAGNOSIS — N179 Acute kidney failure, unspecified: Secondary | ICD-10-CM | POA: Diagnosis not present

## 2023-12-15 DIAGNOSIS — E119 Type 2 diabetes mellitus without complications: Secondary | ICD-10-CM | POA: Diagnosis not present

## 2023-12-15 DIAGNOSIS — Z79899 Other long term (current) drug therapy: Secondary | ICD-10-CM | POA: Diagnosis not present

## 2023-12-15 DIAGNOSIS — Z7984 Long term (current) use of oral hypoglycemic drugs: Secondary | ICD-10-CM | POA: Diagnosis not present

## 2023-12-15 DIAGNOSIS — Z87891 Personal history of nicotine dependence: Secondary | ICD-10-CM | POA: Diagnosis not present

## 2023-12-15 DIAGNOSIS — Z8673 Personal history of transient ischemic attack (TIA), and cerebral infarction without residual deficits: Secondary | ICD-10-CM | POA: Diagnosis not present

## 2023-12-15 DIAGNOSIS — I480 Paroxysmal atrial fibrillation: Secondary | ICD-10-CM | POA: Diagnosis not present

## 2023-12-15 DIAGNOSIS — I951 Orthostatic hypotension: Secondary | ICD-10-CM | POA: Diagnosis not present

## 2023-12-15 LAB — BASIC METABOLIC PANEL
Anion gap: 8 (ref 5–15)
Anion gap: 9 (ref 5–15)
BUN: 22 mg/dL (ref 8–23)
BUN: 20 mg/dL (ref 8–23)
CO2: 24 mmol/L (ref 22–32)
CO2: 25 mmol/L (ref 22–32)
Calcium: 8.7 mg/dL — ABNORMAL LOW (ref 8.9–10.3)
Calcium: 8.8 mg/dL — ABNORMAL LOW (ref 8.9–10.3)
Chloride: 105 mmol/L (ref 98–111)
Chloride: 103 mmol/L (ref 98–111)
Creatinine, Ser: 1.86 mg/dL — ABNORMAL HIGH (ref 0.61–1.24)
Creatinine, Ser: 1.69 mg/dL — ABNORMAL HIGH (ref 0.61–1.24)
GFR, Estimated: 36 mL/min — ABNORMAL LOW (ref >60)
GFR, Estimated: 41 mL/min — ABNORMAL LOW (ref >60)
Glucose, Bld: 119 mg/dL — ABNORMAL HIGH (ref 70–99)
Glucose, Bld: 136 mg/dL — ABNORMAL HIGH (ref 70–99)
Potassium: 3.5 mmol/L (ref 3.5–5.1)
Potassium: 3.6 mmol/L (ref 3.5–5.1)
Sodium: 137 mmol/L (ref 135–145)
Sodium: 137 mmol/L (ref 135–145)

## 2023-12-15 LAB — URINALYSIS, ROUTINE W REFLEX MICROSCOPIC
Bacteria, UA: NONE SEEN
Bilirubin Urine: NEGATIVE
Glucose, UA: >=500 mg/dL — AB
Hgb urine dipstick: NEGATIVE
Ketones, ur: NEGATIVE mg/dL
Leukocytes,Ua: NEGATIVE
Nitrite: NEGATIVE
Protein, ur: NEGATIVE mg/dL
Specific Gravity, Urine: 1.012 (ref 1.005–1.030)
pH: 7 (ref 5.0–8.0)

## 2023-12-15 LAB — CBC
HCT: 28.1 % — ABNORMAL LOW (ref 39.0–52.0)
Hemoglobin: 9 g/dL — ABNORMAL LOW (ref 13.0–17.0)
MCH: 29.1 pg (ref 26.0–34.0)
MCHC: 32 g/dL (ref 30.0–36.0)
MCV: 90.9 fL (ref 80.0–100.0)
Platelets: 231 10*3/uL (ref 150–400)
RBC: 3.09 MIL/uL — ABNORMAL LOW (ref 4.22–5.81)
RDW: 16.5 % — ABNORMAL HIGH (ref 11.5–15.5)
WBC: 5.3 10*3/uL (ref 4.0–10.5)
nRBC: 0 % (ref 0.0–0.2)

## 2023-12-15 NOTE — Progress Notes (Signed)
   12/15/23 1027  TOC Brief Assessment  Patient has primary care physician Yes  Home environment has been reviewed From Home  Prior level of function: Independent  Prior/Current Home Services No current home services  Social Drivers of Health Review SDOH reviewed no interventions necessary  Readmission risk has been reviewed Yes  Transition of care needs no transition of care needs at this time   Transition of Care Department [facility]) has reviewed patient and no TOC needs have been identified at this time. We will continue to monitor patient advancement through interdisciplinary progression rounds. If new patient transition needs arise, please place a TOC consult.

## 2023-12-15 NOTE — Care Management Important Message (Signed)
 Important Message  Patient Details  Name: Connor Bryant MRN: [id number] Date of Birth: 1946/06/10   Important Message Given:  I, Anabel Halon, RN, verbally reviewed observation notice, [name] Signed noticed.      Anabel Halon, RN 12/15/2023, 8:03 AM

## 2023-12-15 NOTE — Progress Notes (Signed)
   12/15/23 1355  Assess: MEWS Score  BP 134/60  Pulse Rate (!) 50  Level of Consciousness Alert  Assess: MEWS Score  MEWS Temp 0  MEWS Systolic 0  MEWS Pulse 1  MEWS RR 1  MEWS LOC 0  MEWS Score 2  MEWS Score Color Yellow  Assess: if the MEWS score is Yellow or Red  Were vital signs accurate and taken at a resting state? Yes  Does the patient meet 2 or more of the SIRS criteria? No  Notify: Charge Nurse/RN  Name of Charge Nurse/RN Notified Chales Abrahams RN  Provider Notification  Provider Name/Title Dr Sherryll Burger  Date Provider Notified 12/15/23  Time Provider Notified 1355  Method of Notification Page  Notification Reason Other (Comment) (yellow mews)  Provider response Other (Comment) (hold bp meds tonight)  Date of Provider Response 12/15/23  Time of Provider Response 1355  Assess: SIRS CRITERIA  SIRS Temperature  0  SIRS Respirations  0  SIRS Pulse 0  SIRS WBC 0  SIRS Score Sum  0

## 2023-12-15 NOTE — Progress Notes (Signed)
 PROGRESS NOTE    Connor Bryant  NWG:[id number] DOB: 1946-02-16 DOA: 12/14/2023 PCP: Mechele Claude, MD   Brief Narrative:    Connor Bryant is a 78 y.o. male with medical history significant of history of atrial fibrillation on Eliquis, diabetes, hypertension, history of MI, history of CVA.  Patient was recently hospitalized due to pneumonia with flu.  In addition, he overload and was transition from Lasix to torsemide and was discharged on 40 mg daily.  He has been taking his medications frequently, but noticed that he has been getting progressively dizzy especially with transitioning from sitting to standing.   Assessment & Plan:   Principal Problem:   AKI (acute kidney injury) (HCC) Active Problems:   Essential hypertension   Type 2 diabetes mellitus without complication, without long-term current use of insulin (HCC)   PAF (paroxysmal atrial fibrillation) (HCC)   Diabetes mellitus treated with oral medication (HCC)   HFrEF (heart failure with reduced ejection fraction) (HCC)  Assessment and Plan:  AKI Gentle IV fluids to continue through this afternoon and then can be discontinued Hold diuretics Recheck creatinine in the morning Orthostasis Gentle IV hydration Check orthostatic vital signs tomorrow morning Hypertension Continue antihypertensives Diabetes Without significant hyperglycemia, carb modified diet and SSI Paroxysmal atrial fibrillation Continue Cardizem and Eliquis Heart failure with reduced EF Will likely need torsemide reduced in half on discharge to 20 mg    DVT prophylaxis: Eliquis Code Status: Full Family Communication: None at bedside Disposition Plan:  Status is: Observation The patient will require care spanning > 2 midnights and should be moved to inpatient because: Need for IV fluids.  Consultants:  None  Procedures:  None  Antimicrobials:  None   Subjective: Patient seen and evaluated today with no new acute complaints or concerns.  No acute concerns or events noted overnight.  He is eager for discharge once able, but continues to have increased creatinine levels.  Objective: Vitals:   12/14/23 1703 12/14/23 2030 12/15/23 0055 12/15/23 0512  BP: 114/65 (!) 176/54 (!) 125/53 (!) 121/54  Pulse: 62 (!) 58 63 62  Resp: (!) 21     Temp: 97.8 F (36.6 C) 97.7 F (36.5 C) 98.3 F (36.8 C) 98.6 F (37 C)  TempSrc: Oral Oral Oral Oral  SpO2:  100% 97% 98%  Weight: 77.1 kg     Height: 5\' 8"  (1.727 m)       Intake/Output Summary (Last 24 hours) at 12/15/2023 0721 Last data filed at 12/15/2023 0617 Gross per 24 hour  Intake 2255.67 ml  Output 1200 ml  Net 1055.67 ml   Filed Weights   12/14/23 1140 12/14/23 1703  Weight: 77.1 kg 77.1 kg    Examination:  General exam: Appears calm and comfortable  Respiratory system: Clear to auscultation. Respiratory effort normal. Cardiovascular system: S1 & S2 heard, RRR.  Gastrointestinal system: Abdomen is soft Central nervous system: Alert and awake Extremities: No edema Skin: No significant lesions noted Psychiatry: Flat affect.    Data Reviewed: I have personally reviewed following labs and imaging studies  CBC: Recent Labs  Lab 12/14/23 1142 12/15/23 0333  WBC 6.0 5.3  HGB 10.3* 9.0*  HCT 32.3* 28.1*  MCV 90.7 90.9  PLT 256 231   Basic Metabolic Panel: Recent Labs  Lab 12/14/23 1142 12/15/23 0333  NA 137 137  K 3.6 3.5  CL 98 105  CO2 23 24  GLUCOSE 158* 119*  BUN 29* 22  CREATININE 2.16* 1.86*  CALCIUM 9.1 8.7*  GFR: Estimated Creatinine Clearance: 31.2 mL/min (A) (by C-G formula based on SCr of 1.86 mg/dL (H)). Liver Function Tests: No results for input(s): "AST", "ALT", "ALKPHOS", "BILITOT", "PROT", "ALBUMIN" in the last 168 hours. No results for input(s): "LIPASE", "AMYLASE" in the last 168 hours. No results for input(s): "AMMONIA" in the last 168 hours. Coagulation Profile: No results for input(s): "INR", "PROTIME" in the last 168  hours. Cardiac Enzymes: No results for input(s): "CKTOTAL", "CKMB", "CKMBINDEX", "TROPONINI" in the last 168 hours. BNP (last 3 results) No results for input(s): "PROBNP" in the last 8760 hours. HbA1C: No results for input(s): "HGBA1C" in the last 72 hours. CBG: Recent Labs  Lab 12/14/23 1145  GLUCAP 135*   Lipid Profile: No results for input(s): "CHOL", "HDL", "LDLCALC", "TRIG", "CHOLHDL", "LDLDIRECT" in the last 72 hours. Thyroid Function Tests: No results for input(s): "TSH", "T4TOTAL", "FREET4", "T3FREE", "THYROIDAB" in the last 72 hours. Anemia Panel: No results for input(s): "VITAMINB12", "FOLATE", "FERRITIN", "TIBC", "IRON", "RETICCTPCT" in the last 72 hours. Sepsis Labs: No results for input(s): "PROCALCITON", "LATICACIDVEN" in the last 168 hours.  No results found for this or any previous visit (from the past 240 hours).       Radiology Studies: No results found.      Scheduled Meds:  apixaban  2.5 mg Oral BID   atorvastatin  80 mg Oral Daily   carvedilol  25 mg Oral BID   diltiazem  180 mg Oral Daily   pantoprazole  40 mg Oral BID   Continuous Infusions:  lactated ringers 100 mL/hr at 12/15/23 0211     LOS: 0 days    Time spent: 55 minutes    Rayonna Heldman Hoover Brunette, DO [facility]  If 7PM-7AM, please contact night-coverage [email or web address] 12/15/2023, 7:21 AM

## 2023-12-15 NOTE — Care Management Obs Status (Signed)
 MEDICARE OBSERVATION STATUS NOTIFICATION   Patient Details  Name: Connor Bryant MRN: [id number] Date of Birth: 02/21/1946   Medicare Observation Status Notification Given:  Yes    Anabel Halon, RN 12/15/2023, 8:55 AM

## 2023-12-16 ENCOUNTER — Observation Stay (HOSPITAL_BASED_OUTPATIENT_CLINIC_OR_DEPARTMENT_OTHER)

## 2023-12-16 ENCOUNTER — Other Ambulatory Visit (HOSPITAL_COMMUNITY): Payer: Self-pay | Admitting: *Deleted

## 2023-12-16 DIAGNOSIS — I480 Paroxysmal atrial fibrillation: Secondary | ICD-10-CM | POA: Diagnosis not present

## 2023-12-16 DIAGNOSIS — I10 Essential (primary) hypertension: Secondary | ICD-10-CM

## 2023-12-16 DIAGNOSIS — I2510 Atherosclerotic heart disease of native coronary artery without angina pectoris: Secondary | ICD-10-CM | POA: Diagnosis not present

## 2023-12-16 DIAGNOSIS — I5022 Chronic systolic (congestive) heart failure: Secondary | ICD-10-CM

## 2023-12-16 DIAGNOSIS — I951 Orthostatic hypotension: Secondary | ICD-10-CM

## 2023-12-16 DIAGNOSIS — N179 Acute kidney failure, unspecified: Secondary | ICD-10-CM | POA: Diagnosis not present

## 2023-12-16 LAB — ECHOCARDIOGRAM COMPLETE
AR max vel: 0.95 cm2
AV Area VTI: 1.12 cm2
AV Area mean vel: 1.08 cm2
AV Mean grad: 17 mmHg
AV Peak grad: 33.8 mmHg
Ao pk vel: 2.91 m/s
Area-P 1/2: 2.17 cm2
Height: 68 in
MV M vel: 5.25 m/s
MV Peak grad: 110.3 mmHg
S' Lateral: 3.3 cm
Weight: 2719.95 [oz_av]

## 2023-12-16 LAB — CBC
HCT: 28.3 % — ABNORMAL LOW (ref 39.0–52.0)
Hemoglobin: 8.9 g/dL — ABNORMAL LOW (ref 13.0–17.0)
MCH: 29 pg (ref 26.0–34.0)
MCHC: 31.4 g/dL (ref 30.0–36.0)
MCV: 92.2 fL (ref 80.0–100.0)
Platelets: 200 10*3/uL (ref 150–400)
RBC: 3.07 MIL/uL — ABNORMAL LOW (ref 4.22–5.81)
RDW: 16.3 % — ABNORMAL HIGH (ref 11.5–15.5)
WBC: 4.3 10*3/uL (ref 4.0–10.5)
nRBC: 0 % (ref 0.0–0.2)

## 2023-12-16 LAB — BASIC METABOLIC PANEL
Anion gap: 12 (ref 5–15)
BUN: 18 mg/dL (ref 8–23)
CO2: 23 mmol/L (ref 22–32)
Calcium: 9 mg/dL (ref 8.9–10.3)
Chloride: 104 mmol/L (ref 98–111)
Creatinine, Ser: 1.35 mg/dL — ABNORMAL HIGH (ref 0.61–1.24)
GFR, Estimated: 53 mL/min — ABNORMAL LOW (ref >60)
Glucose, Bld: 121 mg/dL — ABNORMAL HIGH (ref 70–99)
Potassium: 3.4 mmol/L — ABNORMAL LOW (ref 3.5–5.1)
Sodium: 139 mmol/L (ref 135–145)

## 2023-12-16 LAB — MAGNESIUM: Magnesium: 1.8 mg/dL (ref 1.7–2.4)

## 2023-12-16 MED ORDER — APIXABAN 5 MG PO TABS: 5.0000 mg | ORAL_TABLET | Freq: Two times a day (BID) | ORAL | 1 refills | Status: DC

## 2023-12-16 MED ORDER — ISOSORB DINITRATE-HYDRALAZINE 20-37.5 MG PO TABS: 1.0000 | ORAL_TABLET | Freq: Two times a day (BID) | ORAL | 0 refills | Status: DC

## 2023-12-16 MED ORDER — DILTIAZEM HCL ER COATED BEADS 120 MG PO CP24: 120.0000 mg | ORAL_CAPSULE | Freq: Every day | ORAL | Status: DC

## 2023-12-16 MED ORDER — APIXABAN 5 MG PO TABS: 5.0000 mg | ORAL_TABLET | Freq: Two times a day (BID) | ORAL | Status: DC

## 2023-12-16 MED ORDER — ISOSORB DINITRATE-HYDRALAZINE 20-37.5 MG PO TABS
1.0000 | ORAL_TABLET | Freq: Two times a day (BID) | ORAL | Status: DC
  Administered 2023-12-16: 1 via ORAL
  Filled 2023-12-16 (×4): qty 1

## 2023-12-16 NOTE — Plan of Care (Signed)
 Rested well during the night. No c/o dizziness or lightheadedness.   Problem: Education: Goal: Knowledge of General Education information will improve Description: Including pain rating scale, medication(s)/side effects and non-pharmacologic comfort measures Outcome: Progressing   Problem: Health Behavior/Discharge Planning: Goal: Ability to manage health-related needs will improve Outcome: Progressing   Problem: Clinical Measurements: Goal: Ability to maintain clinical measurements within normal limits will improve Outcome: Progressing Goal: Will remain free from infection Outcome: Progressing Goal: Diagnostic test results will improve Outcome: Progressing Goal: Respiratory complications will improve Outcome: Progressing Goal: Cardiovascular complication will be avoided Outcome: Progressing   Problem: Activity: Goal: Risk for activity intolerance will decrease Outcome: Progressing   Problem: Nutrition: Goal: Adequate nutrition will be maintained Outcome: Progressing   Problem: Coping: Goal: Level of anxiety will decrease Outcome: Progressing   Problem: Elimination: Goal: Will not experience complications related to bowel motility Outcome: Progressing Goal: Will not experience complications related to urinary retention Outcome: Progressing   Problem: Pain Managment: Goal: General experience of comfort will improve and/or be controlled Outcome: Progressing   Problem: Safety: Goal: Ability to remain free from injury will improve Outcome: Progressing   Problem: Skin Integrity: Goal: Risk for impaired skin integrity will decrease Outcome: Progressing

## 2023-12-16 NOTE — Progress Notes (Signed)
*  PRELIMINARY RESULTS* Echocardiogram 2D Echocardiogram has been performed.  Stacey Drain 12/16/2023, 2:49 PM

## 2023-12-16 NOTE — Consult Note (Signed)
 Cardiology Consultation   Patient ID: Connor Bryant MRN: [id number]; DOB: [birth year]  Admit date: 12/14/2023 Date of Consult: 12/16/2023  PCP:  Mechele Claude, MD   [facility] Providers Cardiologist:  Dina Rich, MD        Patient Profile:   Connor Bryant is a 78 y.o. male with a hx of chronic HFrEF, CAD s/p DES to mLAD and ostial RCA 08/2023, CVA, PAF, PE 04/2020, anemia, PAD (prior mesenteric artery bypass 2015 and bilateral iliac stenting at [facility] in 2021), carotid stenosis (1-39% RICA, 40-59% LICA by duplex 08/2023), DM, CKD stage 2-3 (previously followed by Dr. Marisue Humble), HLD, moderate AS, mild MR/TR, GI bleeding, erosive esophagitis, deconditioning, gout who is being seen 12/16/2023 for the evaluation of blood thinner recommendations and bradycardia at the request of Dr. Sherryll Burger.  History of Present Illness:   Connor Bryant has a complex history as above. In 04/2020 he presented with stroke found to have EF 35%, query possible acute plaque rupture vs coronary embolic event given presented with new onset afib and stroke vs. stress induced CM/Takotsubo CM given echo findings, Admission was complicated by PAF, PE, hematuria, anemia, AKI. Cath not emergently pursued given stroke and respiratory failure requiring ventilator. EF subsequently normalized, up to 60-65% in 03/2022. In 07/2023 he was admitted with acute respiratory failure rquiring BiPAP, GIB, a/c HF with recurrent drop in EF, AF RVR, also back in persistent afib at that time. GI w/u revealed erosive gastritis exacerbated by anticoagulation, prednisone use and EtOH consumption. He was readmitted 12/18-09/27/23 with a/c HF with echo continuing to show low EF - 30-35% with global HK, mild LVH, moderately reduced RV function, mildly elevated PA pressure, moderate LAE, mild RAE, mild MR, moderate AS, mild TR. There was a question of mitral stenosis seen on prior echo/subsequent cath not demonstrated on this study. He underwent  cath showing multivessel CAD. It was decided by TCTS and Structural Heart that PCI would be the best course of action and underwent successful PCI and DES x2 to midLAD and ostial RCA on 09/16/23 with Dr. Swaziland who recommended Plavix x 12 months plus continuation of Eliquis without ASA therapy. DC creatinine 2.11. He had hypotension with Entresto, recommended to consider valsartan as OP.  More recently he was readmitted 3/4-3/10/25 for multiple issues - acute respiratory failure due to influenza A, GI bleeding with melena with acute on chronic anemia, hyponatremia, hypokalemia. Interestingly his creatinine had completely normalized. The patient had self-discontinued his Plavix prior to that admission due to nosebleeds. He underwent upper GI endoscopy, no active bleeding was found, give OK to resume anticoagulation. Per their note, "may need to evaluate other reasons for anemia given normal iron stores and no clinical bleeding." Notes indicate he was advised to go back on Plavix but the patient declined - Dr. Algie Coffer had reviewed the chart. There were several medicine changes prompted by the medicine team at that time - initiation of torsemide, potassium, and diltiazem, dose adjustment of carvedilol and Eliquis, and cessation of Lasix and amiodarone (notes outline concern for amiodarone and respiratory distress), The patient had not followed up in clinic with [place] after his 09/2023 discharge. He cancelled his 09/2023 appointment and it was rescheduled to tomorrow.  He was readmitted with weakness. For several days he had noticed dizziness whenever he would go to try and stand up. He denied any CP, SOB, edema, n/v, diarrhea or syncope. He found to have orthostatic hypotension with reported drop in BP by  EMS. He was found to have AKI at 1.86, Hgb 10.3->9.0->8.9 (discharged at 8.6). PTA home regimen was apixaban 2.5mg  BID, carvedilol 25mg  BID, diltiazem 180mg  daily, spironolactone 12.5mg  daily, torsemide 40mg   daily. Yesterday afternoon ~1pm he had brief episode of bradycardia in the 30s (appeared to be brief sinus pauses and one episode of junctional rhythm) as well as episode of WCT ~40 seconds this morning with some irregularity, question VT versus AF with abberrancy. He was asymptomatic with both and reports he has since gotten up to go to the bathroom and no longer felt dizzy. Carvedilol, diltiazem, torsemide and spironolactone are on hold.   Past Medical History:  Diagnosis Date   Acute CVA (cerebrovascular accident) (HCC) 05/01/2020   Acute respiratory failure with hypoxia (HCC) 05/04/2020   Arthritis    Atrial fibrillation (HCC)    DM type 2 (diabetes mellitus, type 2) (HCC)    no meds   Endotracheally intubated    Gallstone    Heart murmur    was told one time he had a murmur, not since 30 years ago   HTN (hypertension)    Hyperlipidemia    Myocardial infarct, old    2021   Personal history of colonic polyp-adenoma 11/24/2013   Renal insufficiency    mild   Thoracic spine fracture [facility])     Past Surgical History:  Procedure Laterality Date   ABDOMINAL AORTAGRAM N/A 02/23/2014   Procedure: ABDOMINAL Ronny Flurry;  Surgeon: Fransisco Hertz, MD;  Location: [facility] CATH LAB;  Service: Cardiovascular;  Laterality: N/A;   BIOPSY  08/20/2023   Procedure: BIOPSY;  Surgeon: Imogene Burn, MD;  Location: [facility] ENDOSCOPY;  Service: Gastroenterology;;   CATARACT EXTRACTION, BILATERAL     COLONOSCOPY     CORONARY ATHERECTOMY N/A 09/16/2023   Procedure: CORONARY ATHERECTOMY;  Surgeon: Swaziland, Peter M, MD;  Location: [facility] INVASIVE CV LAB;  Service: Cardiovascular;  Laterality: N/A;   CORONARY LITHOTRIPSY N/A 09/16/2023   Procedure: CORONARY LITHOTRIPSY;  Surgeon: Swaziland, Peter M, MD;  Location: [facility] INVASIVE CV LAB;  Service: Cardiovascular;  Laterality: N/A;   CORONARY STENT INTERVENTION N/A 09/16/2023   Procedure: CORONARY STENT INTERVENTION;  Surgeon: Swaziland, Peter M, MD;  Location: [facility] INVASIVE CV LAB;   Service: Cardiovascular;  Laterality: N/A;   CORONARY ULTRASOUND/IVUS N/A 09/16/2023   Procedure: Coronary Ultrasound/IVUS;  Surgeon: Swaziland, Peter M, MD;  Location: [facility] INVASIVE CV LAB;  Service: Cardiovascular;  Laterality: N/A;   DEBRIDEMENT TENNIS ELBOW Right    ESOPHAGOGASTRODUODENOSCOPY     ESOPHAGOGASTRODUODENOSCOPY N/A 11/30/2023   Procedure: EGD (ESOPHAGOGASTRODUODENOSCOPY);  Surgeon: Marguerita Merles, Reuel Boom, MD;  Location: [facility] ENDO SUITE;  Service: Gastroenterology;  Laterality: N/A;   ESOPHAGOGASTRODUODENOSCOPY (EGD) WITH PROPOFOL N/A 08/20/2023   Procedure: ESOPHAGOGASTRODUODENOSCOPY (EGD) WITH PROPOFOL;  Surgeon: Imogene Burn, MD;  Location: [facility] ENDOSCOPY;  Service: Gastroenterology;  Laterality: N/A;   MESENTERIC ARTERY BYPASS N/A 03/10/2014   Procedure: AORTO TO SUPERIOR MESENTERIC ARTERY BYPASS;  Surgeon: Pryor Ochoa, MD;  Location: [facility] OR;  Service: Vascular;  Laterality: N/A;   RIGHT/LEFT HEART CATH AND CORONARY ANGIOGRAPHY N/A 09/11/2023   Procedure: RIGHT/LEFT HEART CATH AND CORONARY ANGIOGRAPHY;  Surgeon: Laurey Morale, MD;  Location: [facility] INVASIVE CV LAB;  Service: Cardiovascular;  Laterality: N/A;   TONSILLECTOMY AND ADENOIDECTOMY     vascualr surgery     illiac stents     Home Medications:  Prior to Admission medications   Medication Sig Start Date End Date Taking? Authorizing Provider  allopurinol (ZYLOPRIM) 100  MG tablet TAKE 1 TABLET 2 TO 3 TIMES A DAY FOR GOUT 11/19/23  Yes Mechele Claude, MD  apixaban (ELIQUIS) 2.5 MG TABS tablet Take 1 tablet (2.5 mg total) by mouth 2 (two) times daily. 12/09/23  Yes Mechele Claude, MD  atorvastatin (LIPITOR) 80 MG tablet Take 1 tablet (80 mg total) by mouth daily. 10/15/23  Yes Mechele Claude, MD  Calcium Carb-Cholecalciferol (CALCIUM 500 + D PO) Take 1 tablet by mouth daily.   Yes [provider]  carvedilol (COREG) 25 MG tablet Take 1 tablet (25 mg total) by mouth 2 (two) times daily. 12/02/23 01/01/24 Yes Dorcas Carrow, MD  diltiazem (CARDIZEM CD) 180 MG 24 hr capsule Take 1 capsule (180 mg total) by mouth daily. 12/03/23 01/02/24 Yes Ghimire, Lyndel Safe, MD  FARXIGA 10 MG TABS tablet Take 10 mg by mouth every morning. 11/05/23  Yes [provider]  ferrous sulfate 325 (65 FE) MG tablet Take 1 tablet (325 mg total) by mouth daily. 09/28/23  Yes Lee, [place], NP  pantoprazole (PROTONIX) 40 MG tablet TAKE ONE TABLET TWICE DAILY 11/21/23  Yes Stacks, Broadus John, MD  potassium chloride SA (KLOR-CON M) 20 MEQ tablet Take 1 tablet (20 mEq total) by mouth daily. 12/03/23 01/02/24 Yes Dorcas Carrow, MD  spironolactone (ALDACTONE) 25 MG tablet Take 0.5 tablets (12.5 mg total) by mouth daily. 09/03/23  Yes Laurey Morale, MD  torsemide (DEMADEX) 20 MG tablet Take 40 mg by mouth daily. 12/02/23  Yes [provider]  Accu-Chek Softclix Lancets lancets SMARTSIG:Topical 1-3 Times Daily 06/25/23   [provider]  Blood Glucose Monitoring Suppl (ACCU-CHEK GUIDE) w/Device KIT CHECK BLOOD SUGAR UP TO THREE TIMES DAILY AS DIRECTED 06/25/23   [provider]  Blood Glucose Monitoring Suppl DEVI 1 each by Does not apply route in the morning, at noon, and at bedtime. May substitute to any manufacturer covered by patient's insurance. 06/25/23   Mechele Claude, MD  clopidogrel (PLAVIX) 75 MG tablet Take 1 tablet (75 mg total) by mouth daily with breakfast. 09/28/23   Lee, [place], NP    Inpatient Medications: Scheduled Meds:  apixaban  2.5 mg Oral BID   atorvastatin  80 mg Oral Daily   pantoprazole  40 mg Oral BID   Continuous Infusions:  PRN Meds:   Allergies:   No Known Allergies  Social History:   Social History   Socioeconomic History   Marital status: Widowed    Spouse name: Not on file   Number of children: 1   Years of education: Not on file   Highest education level: High school graduate  Occupational History   Occupation: Retired  Tobacco Use   Smoking status: Former    Current  packs/day: 0.00    Types: Cigarettes    Quit date: 07/08/2009    Years since quitting: 14.4    Passive exposure: Past   Smokeless tobacco: Never  Vaping Use   Vaping status: Never Used  Substance and Sexual Activity   Alcohol use: Not Currently   Drug use: Not Currently   Sexual activity: Yes  Other Topics Concern   Not on file  Social History Narrative   Patient is married, he is retired. 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   Social Drivers of Health   Financial Resource Strain: Low Risk  (12/09/2023)   Overall Financial Resource Strain (CARDIA)    Difficulty of Paying Living Expenses: Not hard at all  Food Insecurity: No Food Insecurity (  12/14/2023)   Hunger Vital Sign    Worried About Running Out of Food in the Last Year: Never true    Ran Out of Food in the Last Year: Never true  Transportation Needs: No Transportation Needs (12/14/2023)   PRAPARE - Administrator, Civil Service (Medical): No    Lack of Transportation (Non-Medical): No  Physical Activity: Inactive (12/04/2023)   Exercise Vital Sign    Days of Exercise per Week: 0 days    Minutes of Exercise per Session: 0 min  Stress: No Stress Concern Present (12/09/2023)   [employer] of Occupational Health - Occupational Stress Questionnaire    Feeling of Stress : Not at all  Social Connections: Moderately Isolated (12/14/2023)   Social Connection and Isolation Panel [NHANES]    Frequency of Communication with Friends and Family: More than three times a week    Frequency of Social Gatherings with Friends and Family: More than three times a week    Attends Religious Services: Never    Database administrator or Organizations: Yes    Attends Banker Meetings: Never    Marital Status: Widowed  Intimate Partner Violence: Not At Risk (12/14/2023)   Humiliation, Afraid, Rape, and Kick questionnaire    Fear of Current or Ex-Partner: No    Emotionally Abused: No    Physically  Abused: No    Sexually Abused: No    Family History:   Family History  Problem Relation Age of Onset   Diabetes Mother    Heart disease Mother        before age 88   Hyperlipidemia Mother    Hypertension Mother    AAA (abdominal aortic aneurysm) Mother    Stroke Father    Hypertension Father    Coronary artery disease Father    Hyperlipidemia Father    Diabetes Father    Bladder Cancer Father    Cancer Father    Heart disease Father    Heart attack Father    Colon cancer Neg Hx    Throat cancer Neg Hx    Kidney disease Neg Hx    Liver disease Neg Hx      ROS:  Please see the history of present illness.  All other ROS reviewed and negative.     Physical Exam/Data:   Vitals:   12/15/23 2200 12/15/23 2238 12/15/23 2240 12/16/23 0450  BP: (!) 178/58 (!) 156/105 (!) 143/55 (!) 164/67  Pulse: 60 (!) 58 (!) 58 (!) 58  Resp:    18  Temp:    (!) 97.5 F (36.4 C)  TempSrc:    Oral  SpO2:    97%  Weight:      Height:        Intake/Output Summary (Last 24 hours) at 12/16/2023 0821 Last data filed at 12/16/2023 0557 Gross per 24 hour  Intake 1553.56 ml  Output 700 ml  Net 853.56 ml      12/14/2023    5:03 PM 12/14/2023   11:40 AM 12/09/2023    4:32 PM  Last 3 Weights  Weight (lbs) 170 lb 170 lb 170 lb  Weight (kg) 77.11 kg 77.11 kg 77.111 kg     Body mass index is 25.85 kg/m.  General: Well developed, well nourished, in no acute distress. Head: Normocephalic, atraumatic, sclera non-icteric, no xanthomas, nares are without discharge. Neck: Negative for carotid bruits. JVP not elevated. Lungs: Clear bilaterally to auscultation without wheezes, rales, or rhonchi. Breathing is  unlabored. Heart: RRR S1 S2 without murmurs, rubs, or gallops.  Abdomen: Soft, non-tender, non-distended with normoactive bowel sounds. No rebound/guarding. Extremities: No clubbing or cyanosis. No edema. Distal pedal pulses are 2+ and equal bilaterally. Neuro: Alert and oriented X 3. Moves all  extremities spontaneously. Psych:  Responds to questions appropriately with a normal affect.   EKG:  The EKG was personally reviewed and demonstrates:  sinus bradycardia first degree AVB 52bpm, prior septal and inferior infarct, nonspecific STTW changes Telemetry:  Telemetry was personally reviewed and demonstrates:  outlined above  Relevant CV Studies: Echo 08/2023    1. Left ventricular ejection fraction, by estimation, is 30 to 35%. The  left ventricle has moderately decreased function. The left ventricle  demonstrates global hypokinesis. There is mild left ventricular  hypertrophy. Left ventricular diastolic  parameters are indeterminate.   2. Right ventricular systolic function is moderately reduced. The right  ventricular size is normal. There is mildly elevated pulmonary artery  systolic pressure. The estimated right ventricular systolic pressure is  37.4 mmHg.   3. Left atrial size was moderately dilated.   4. Right atrial size was mildly dilated.   5. The mitral valve is degenerative. Mild mitral valve regurgitation.   6. The aortic valve is calcified. Aortic valve regurgitation is not  visualized. Moderate aortic valve stenosis.   7. The inferior vena cava is normal in size with <50% respiratory  variability, suggesting right atrial pressure of 8 mmHg.    Otherwise see EMR  Laboratory Data:  High Sensitivity Troponin:   Recent Labs  Lab 11/26/23 1336 11/26/23 1508  TROPONINIHS 161* 148*     Chemistry Recent Labs  Lab 12/15/23 0333 12/15/23 1159 12/16/23 0512  NA 137 137 139  K 3.5 3.6 3.4*  CL 105 103 104  CO2 24 25 23   GLUCOSE 119* 136* 121*  BUN 22 20 18   CREATININE 1.86* 1.69* 1.35*  CALCIUM 8.7* 8.8* 9.0  MG  --   --  1.8  GFRNONAA 36* 41* 53*  ANIONGAP 8 9 12     No results for input(s): "PROT", "ALBUMIN", "AST", "ALT", "ALKPHOS", "BILITOT" in the last 168 hours. Lipids No results for input(s): "CHOL", "TRIG", "HDL", "LABVLDL", "LDLCALC",  "CHOLHDL" in the last 168 hours.  Hematology Recent Labs  Lab 12/14/23 1142 12/15/23 0333 12/16/23 0512  WBC 6.0 5.3 4.3  RBC 3.56* 3.09* 3.07*  HGB 10.3* 9.0* 8.9*  HCT 32.3* 28.1* 28.3*  MCV 90.7 90.9 92.2  MCH 28.9 29.1 29.0  MCHC 31.9 32.0 31.4  RDW 16.4* 16.5* 16.3*  PLT 256 231 200   Thyroid No results for input(s): "TSH", "FREET4" in the last 168 hours.  BNPNo results for input(s): "BNP", "PROBNP" in the last 168 hours.  DDimer No results for input(s): "DDIMER" in the last 168 hours.   Radiology/Studies:  No results found.   Assessment and Plan:   1. Orthostatic hypotension in setting of known chronic HFrEF - carvedilol, diltiazem, torsemide, spironactone on hold - repeat echo - will review with MD  2. Persistent atrial fibrillation, maintaining NSR with brief bradycardia (brief sinus pauses/junctional rhythm) 12/15/23, and short run VT versus AF with abberrancy 12/16/23 - previously on amio + carvedilol, switched from amio to diltiazem by medicine team 09/2023 - diltiazem less ideal given low EF - may be reasonable to hold off amio given maintaining NSR - lyte mgmt per primary team - recommend keep K 4.0 or greater, Mg 2.0 or greater - will review regimen with MD  3. CAD s/p DESx2 to mLAD and ostial RCA 08/2023 - recommended for Plavix x 12 months + Eliquis then admitted with recurrent GIB/flu A at which time patient given OK to resume anticoagulation but he is adamant he will not take Plavix going forward - not on ASA due to erosive gastritis hx - remains on statin - no angina  4. H/o GIB, chronic anemia - denies any recurrent bleeding - further mgmt per primary team  5. AKI on CKD stage 2-3 - difficult baseline, was ~2 range in 09/2023, then normalized in early 11/2023 with several medication changes, admitted at 2.16, down to 1.35 today - follow with med changes above  6. Moderate AS, mild MR/TR, possible prior mitral stenosis - f/u echo ordered  7.  PAD - can f/u OP   Risk Assessment/Risk Scores:        New York Heart Association (NYHA) Functional Class NYHA Class II  CHA2DS2-VASc Score = 8   This indicates a 10.8% annual risk of stroke. The patient's score is based upon: CHF History: 1 HTN History: 1 Diabetes History: 1 Stroke History: 2 Vascular Disease History: 1 Age Score: 2 Gender Score: 0         For questions or updates, please contact [facility] Please consult [email or web address] for contact info under    Signed, Laurann Montana, PA-C  12/16/2023 8:21 AM

## 2023-12-16 NOTE — Discharge Summary (Signed)
 Physician Discharge Summary  Connor Bryant EXB:[id number] DOB: 08/31/1946 DOA: 12/14/2023  PCP: Mechele Claude, MD  Admit date: 12/14/2023  Discharge date: 12/16/2023  Admitted From:  Disposition:    Recommendations for Outpatient Follow-up:  Follow up with PCP in 1-2 weeks Go to follow-up with heart failure clinic as noted on 3/25 Remain on BiDil twice daily as prescribed per cardiology Remain off of Coreg, diltiazem, Aldactone, and torsemide for now and plan to resume Aldactone once AKI fully resolves Please recheck BMP in the next 3-5 days to ensure resolution of AKI Eliquis dose adjusted to 5 mg twice daily  Home Health: None  Equipment/Devices: None  Discharge Condition:Stable  CODE STATUS: Full  Diet recommendation: Heart Healthy/carb modified  Brief/Interim Summary: Connor Bryant is a 78 y.o. male with medical history significant of history of atrial fibrillation on Eliquis, diabetes, hypertension, history of MI, history of CVA.  Patient was recently hospitalized due to pneumonia with flu.  In addition, he overload and was transition from Lasix to torsemide and was discharged on 40 mg daily.  He has been taking his medications frequently, but noticed that he has been getting progressively dizzy especially with transitioning from sitting to standing.  Patient was admitted with orthostasis in the setting of dehydration along with associated AKI.  He has received IV fluids with improvement in his creatinine levels and he is no longer orthostatic and eager for discharge.  He was noted to have development of bradycardia with first-degree AV block on EKG and therefore his diltiazem and Coreg was held.  He continues to remain bradycardic, but is otherwise asymptomatic and has heart rate 50-60 bpm.  He was seen by cardiology and he had refused Plavix given prior bleeding issues with this.  His Eliquis dose has been adjusted and he plans to follow-up with heart failure clinic tomorrow.   His diuretics have also been held along with [place] until AKI fully resolves.  No other acute events or concerns noted.  Discharge Diagnoses:  Principal Problem:   AKI (acute kidney injury) (HCC) Active Problems:   Essential hypertension   Type 2 diabetes mellitus without complication, without long-term current use of insulin (HCC)   PAF (paroxysmal atrial fibrillation) (HCC)   Diabetes mellitus treated with oral medication (HCC)   HFrEF (heart failure with reduced ejection fraction) (HCC)  Principal discharge diagnosis: Orthostasis secondary to dehydration with associated AKI in the setting of aggressive diuresis with torsemide.  Discharge Instructions  Discharge Instructions     Diet - low sodium heart healthy   Complete by: As directed    Increase activity slowly   Complete by: As directed       Allergies as of 12/16/2023   No Known Allergies      Medication List     STOP taking these medications    carvedilol 25 MG tablet Commonly known as: COREG   clopidogrel 75 MG tablet Commonly known as: PLAVIX   diltiazem 180 MG 24 hr capsule Commonly known as: CARDIZEM CD   Farxiga 10 MG Tabs tablet Generic drug: dapagliflozin propanediol   potassium chloride SA 20 MEQ tablet Commonly known as: KLOR-CON M   spironolactone 25 MG tablet Commonly known as: ALDACTONE   torsemide 20 MG tablet Commonly known as: DEMADEX       TAKE these medications    Accu-Chek Softclix Lancets lancets SMARTSIG:Topical 1-3 Times Daily   allopurinol 100 MG tablet Commonly known as: ZYLOPRIM TAKE 1 TABLET 2 TO 3 TIMES A  DAY FOR GOUT   apixaban 5 MG Tabs tablet Commonly known as: ELIQUIS Take 1 tablet (5 mg total) by mouth 2 (two) times daily. What changed:  medication strength how much to take   atorvastatin 80 MG tablet Commonly known as: LIPITOR Take 1 tablet (80 mg total) by mouth daily.   Blood Glucose Monitoring Suppl Devi 1 each by Does not apply route in the  morning, at noon, and at bedtime. May substitute to any manufacturer covered by patient's insurance.   Accu-Chek Guide w/Device Kit CHECK BLOOD SUGAR UP TO THREE TIMES DAILY AS DIRECTED   CALCIUM 500 + D PO Take 1 tablet by mouth daily.   ferrous sulfate 325 (65 FE) MG tablet Take 1 tablet (325 mg total) by mouth daily.   isosorbide-hydrALAZINE 20-37.5 MG tablet Commonly known as: BIDIL Take 1 tablet by mouth 2 (two) times daily.   pantoprazole 40 MG tablet Commonly known as: PROTONIX TAKE ONE TABLET TWICE DAILY        Follow-up Information     Mechele Claude, MD. Schedule an appointment as soon as possible for a visit in 1 week(s).   Specialty: Family Medicine Contact information: [address] [place] [address] [phone]                No Known Allergies  Consultations: Cardiology   Procedures/Studies: DG CHEST PORT 1 VIEW Result Date: 11/29/2023 CLINICAL DATA:  Shortness of breath. EXAM: PORTABLE CHEST 1 VIEW COMPARISON:  X-ray 11/27/2023 and older FINDINGS: Enlarged cardiopericardial silhouette with calcified tortuous aorta. Diffuse interstitial changes are again seen. Tiny pleural effusions. More focal left retrocardiac opacity also identified. Apical pleural thickening calcifications. No pneumothorax. Overlapping cardiac leads. Degenerative changes along the spine. Old, healed left clavicle fracture. IMPRESSION: No significant oval change when adjusted for technique. Electronically Signed   By: Karen Kays M.D.   On: 11/29/2023 17:15   DG Chest Port 1 View Result Date: 11/27/2023 CLINICAL DATA:  Dyspnea. EXAM: PORTABLE CHEST 1 VIEW COMPARISON:  November 26, 2023. FINDINGS: Stable cardiomegaly is noted. Increased interstitial densities are noted throughout both lungs most consistent with pulmonary edema. Old left clavicular fracture is noted. IMPRESSION: Probable bilateral pulmonary edema. Electronically Signed   By: Lupita Raider M.D.   On: 11/27/2023 09:49    DG Chest Portable 1 View Result Date: 11/26/2023 CLINICAL DATA:  Several day history of weakness, cough, congestion EXAM: PORTABLE CHEST 1 VIEW COMPARISON:  Chest radiograph dated 09/12/2023 FINDINGS: Patient is rotated to the left. Normal lung volumes. Vascular calcifications project over the bilateral apices. Prominent bilateral interstitial opacities. Patchy left retrocardiac opacities. No pleural effusion or pneumothorax. Similar mildly enlarged cardiomediastinal silhouette. No acute osseous abnormality. IMPRESSION: 1. Prominent bilateral interstitial opacities, which may represent pulmonary edema. 2. Patchy left retrocardiac opacities, which may represent atelectasis, aspiration, or pneumonia. Electronically Signed   By: Agustin Cree M.D.   On: 11/26/2023 18:19     Discharge Exam: Vitals:   12/15/23 2240 12/16/23 0450  BP: (!) 143/55 (!) 164/67  Pulse: (!) 58 (!) 58  Resp:  18  Temp:  (!) 97.5 F (36.4 C)  SpO2:  97%   Vitals:   12/15/23 2200 12/15/23 2238 12/15/23 2240 12/16/23 0450  BP: (!) 178/58 (!) 156/105 (!) 143/55 (!) 164/67  Pulse: 60 (!) 58 (!) 58 (!) 58  Resp:    18  Temp:    (!) 97.5 F (36.4 C)  TempSrc:    Oral  SpO2:  97%  Weight:      Height:        General: Pt is alert, awake, not in acute distress Cardiovascular: RRR, S1/S2 +, no rubs, no gallops Respiratory: CTA bilaterally, no wheezing, no rhonchi Abdominal: Soft, NT, ND, bowel sounds + Extremities: no edema, no cyanosis    The results of significant diagnostics from this hospitalization (including imaging, microbiology, ancillary and laboratory) are listed below for reference.     Microbiology: No results found for this or any previous visit (from the past 240 hours).   Labs: BNP (last 3 results) Recent Labs    09/11/23 1637 11/26/23 1336 11/29/23 0418  BNP 1,478.1* 858.0* 2,033.0*   Basic Metabolic Panel: Recent Labs  Lab 12/14/23 1142 12/15/23 0333 12/15/23 1159 12/16/23 0512  NA  137 137 137 139  K 3.6 3.5 3.6 3.4*  CL 98 105 103 104  CO2 23 24 25 23   GLUCOSE 158* 119* 136* 121*  BUN 29* 22 20 18   CREATININE 2.16* 1.86* 1.69* 1.35*  CALCIUM 9.1 8.7* 8.8* 9.0  MG  --   --   --  1.8   Liver Function Tests: No results for input(s): "AST", "ALT", "ALKPHOS", "BILITOT", "PROT", "ALBUMIN" in the last 168 hours. No results for input(s): "LIPASE", "AMYLASE" in the last 168 hours. No results for input(s): "AMMONIA" in the last 168 hours. CBC: Recent Labs  Lab 12/14/23 1142 12/15/23 0333 12/16/23 0512  WBC 6.0 5.3 4.3  HGB 10.3* 9.0* 8.9*  HCT 32.3* 28.1* 28.3*  MCV 90.7 90.9 92.2  PLT 256 231 200   Cardiac Enzymes: No results for input(s): "CKTOTAL", "CKMB", "CKMBINDEX", "TROPONINI" in the last 168 hours. BNP: Invalid input(s): "POCBNP" CBG: Recent Labs  Lab 12/14/23 1145  GLUCAP 135*   D-Dimer No results for input(s): "DDIMER" in the last 72 hours. Hgb A1c No results for input(s): "HGBA1C" in the last 72 hours. Lipid Profile No results for input(s): "CHOL", "HDL", "LDLCALC", "TRIG", "CHOLHDL", "LDLDIRECT" in the last 72 hours. Thyroid function studies No results for input(s): "TSH", "T4TOTAL", "T3FREE", "THYROIDAB" in the last 72 hours.  Invalid input(s): "FREET3" Anemia work up No results for input(s): "VITAMINB12", "FOLATE", "FERRITIN", "TIBC", "IRON", "RETICCTPCT" in the last 72 hours. Urinalysis    Component Value Date/Time   COLORURINE YELLOW 12/14/2023 1142   APPEARANCEUR CLEAR 12/14/2023 1142   LABSPEC 1.012 12/14/2023 1142   PHURINE 7.0 12/14/2023 1142   GLUCOSEU >=500 (A) 12/14/2023 1142   HGBUR NEGATIVE 12/14/2023 1142   BILIRUBINUR NEGATIVE 12/14/2023 1142   KETONESUR NEGATIVE 12/14/2023 1142   PROTEINUR NEGATIVE 12/14/2023 1142   UROBILINOGEN 1.0 03/04/2014 1358   NITRITE NEGATIVE 12/14/2023 1142   LEUKOCYTESUR NEGATIVE 12/14/2023 1142   Sepsis Labs Recent Labs  Lab 12/14/23 1142 12/15/23 0333 12/16/23 0512  WBC 6.0 5.3  4.3   Microbiology No results found for this or any previous visit (from the past 240 hours).   Time coordinating discharge: 35 minutes  SIGNED:   Erick Blinks, DO [facility] 12/16/2023, 12:41 PM  If 7PM-7AM, please contact night-coverage [email or web address]

## 2023-12-16 NOTE — Progress Notes (Signed)
 Mobility Specialist Progress Note:    12/16/23 1115  Orthostatic Lying   BP- Lying 148/57  Pulse- Lying 57  Orthostatic Sitting  BP- Sitting 152/66  Pulse- Sitting 61  Orthostatic Standing at 0 minutes  BP- Standing at 0 minutes 136/59  Pulse- Standing at 0 minutes 63  Mobility  Activity Stood at bedside;Dangled on edge of bed;Ambulated with assistance to bathroom  Level of Assistance Standby assist, set-up cues, supervision of patient - no hands on  Assistive Device None;Front wheel walker  Distance Ambulated (ft) 50 ft  Range of Motion/Exercises Active;All extremities  Activity Response Tolerated well  Mobility Referral Yes  Mobility visit 1 Mobility  Mobility Specialist Start Time (ACUTE ONLY) 1110  Mobility Specialist Stop Time (ACUTE ONLY) 1130  Mobility Specialist Time Calculation (min) (ACUTE ONLY) 20 min   Pt received in bed, agreeable to mobility. Required SBA to stand and ambulate with RW. Tolerated well, asx throughout. Left pt sitting EOB, all needs met.   Lawerance Bach Mobility Specialist Please contact via Special educational needs teacher or  Rehab office at [phone]

## 2023-12-17 ENCOUNTER — Encounter (HOSPITAL_COMMUNITY): Payer: Medicare Other | Admitting: Cardiology

## 2023-12-18 ENCOUNTER — Telehealth: Payer: Self-pay

## 2023-12-18 NOTE — Transitions of Care (Post Inpatient/ED Visit) (Signed)
 12/18/2023  Name: Connor Bryant MRN: [id number] DOB: November 23, 1945  Today's TOC FU Call Status: Today's TOC FU Call Status:: Successful TOC FU Call Completed TOC FU Call Complete Date: 12/18/23 Patient's Name and Date of Birth confirmed.  Transition Care Management Follow-up Telephone Call Date of Discharge: 12/16/23 [facility] Type of Discharge: Emergency Department Primary Inpatient Discharge Diagnosis:: AKI How have you been since you were released from the hospital?: Better Any questions or concerns?: No  Items Reviewed: Did you receive and understand the discharge instructions provided?: Yes Medications obtained,verified, and reconciled?: Yes (Medications Reviewed) Any new allergies since your discharge?: No Dietary orders reviewed?: NA Do you have support at home?: Yes People in Home: child(ren), adult  Medications Reviewed Today: Medications Reviewed Today     Reviewed by Anthoney Harada, LPN (Licensed Practical Nurse) on 12/18/23 at 1317  Med List Status: <None>   Medication Order Taking? Sig Documenting Provider Last Dose Status Informant  Accu-Chek Softclix Lancets lancets [id number] Yes SMARTSIG:Topical 1-3 Times Daily [provider] Taking Active   allopurinol (ZYLOPRIM) 100 MG tablet [id number] Yes TAKE 1 TABLET 2 TO 3 TIMES A DAY FOR GOUT Stacks, Warren, MD Taking Active            Med Note Elesa Massed, Illene Labrador   Sat Dec 14, 2023  2:58 PM)    apixaban (ELIQUIS) 5 MG TABS tablet [id number] Yes Take 1 tablet (5 mg total) by mouth 2 (two) times daily. Sherryll Burger, Pratik D, DO Taking Active   atorvastatin (LIPITOR) 80 MG tablet [id number] Yes Take 1 tablet (80 mg total) by mouth daily. Mechele Claude, MD Taking Active            Med Note Elesa Massed, Illene Labrador   Sat Dec 14, 2023  2:58 PM)    Blood Glucose Monitoring Suppl (ACCU-CHEK GUIDE) w/Device Andria Rhein [id number] Yes CHECK BLOOD SUGAR UP TO THREE TIMES DAILY AS DIRECTED [provider] Taking  Active   Blood Glucose Monitoring Suppl DEVI [id number] Yes 1 each by Does not apply route in the morning, at noon, and at bedtime. May substitute to any manufacturer covered by patient's insurance. Mechele Claude, MD Taking Active Self  Calcium Carb-Cholecalciferol (CALCIUM 500 + D PO) [id number] Yes Take 1 tablet by mouth daily. [provider] Taking Active Self  ferrous sulfate 325 (65 FE) MG tablet [id number] Yes Take 1 tablet (325 mg total) by mouth daily. Lee, [place], NP Taking Active   isosorbide-hydrALAZINE (BIDIL) 20-37.5 MG tablet [id number] Yes Take 1 tablet by mouth 2 (two) times daily. Sherryll Burger, Pratik D, DO Taking Active   pantoprazole (PROTONIX) 40 MG tablet [id number] Yes TAKE ONE TABLET TWICE DAILY Mechele Claude, MD Taking Active            Med Note (WARD, ANGELICA G   Sat Dec 14, 2023  2:58 PM)              Home Care and Equipment/Supplies: Were Home Health Services Ordered?: No Any new equipment or medical supplies ordered?: NA  Functional Questionnaire: Do you need assistance with bathing/showering or dressing?: No Do you need assistance with meal preparation?: No Do you need assistance with eating?: No Do you have difficulty maintaining continence: No Do you need assistance with getting out of bed/getting out of a chair/moving?: No Do you have difficulty managing or taking your medications?: No  Follow up appointments reviewed: PCP Follow-up appointment confirmed?: Yes Date of PCP follow-up appointment?: 12/24/23 Follow-up Provider: [facility] Follow-up appointment confirmed?: Yes Date of Specialist follow-up appointment?: 01/13/24 Follow-Up Specialty Provider:: HF Clinic Do you need transportation to your follow-up appointment?: No Do you understand care options if your condition(s) worsen?: Yes-patient verbalized understanding    SIGNATURE Kandis Fantasia, LPN [facility] Health Advisor [facility] You  Are. We Are. [facility] Direct Dial [phone]

## 2023-12-19 DIAGNOSIS — N1831 Chronic kidney disease, stage 3a: Secondary | ICD-10-CM | POA: Diagnosis not present

## 2023-12-19 DIAGNOSIS — I130 Hypertensive heart and chronic kidney disease with heart failure and stage 1 through stage 4 chronic kidney disease, or unspecified chronic kidney disease: Secondary | ICD-10-CM | POA: Diagnosis not present

## 2023-12-19 DIAGNOSIS — Z7901 Long term (current) use of anticoagulants: Secondary | ICD-10-CM | POA: Diagnosis not present

## 2023-12-19 DIAGNOSIS — E1122 Type 2 diabetes mellitus with diabetic chronic kidney disease: Secondary | ICD-10-CM | POA: Diagnosis not present

## 2023-12-19 DIAGNOSIS — D631 Anemia in chronic kidney disease: Secondary | ICD-10-CM | POA: Diagnosis not present

## 2023-12-19 DIAGNOSIS — M109 Gout, unspecified: Secondary | ICD-10-CM | POA: Diagnosis not present

## 2023-12-19 DIAGNOSIS — E785 Hyperlipidemia, unspecified: Secondary | ICD-10-CM | POA: Diagnosis not present

## 2023-12-19 DIAGNOSIS — I480 Paroxysmal atrial fibrillation: Secondary | ICD-10-CM | POA: Diagnosis not present

## 2023-12-19 DIAGNOSIS — D500 Iron deficiency anemia secondary to blood loss (chronic): Secondary | ICD-10-CM | POA: Diagnosis not present

## 2023-12-19 DIAGNOSIS — Z955 Presence of coronary angioplasty implant and graft: Secondary | ICD-10-CM | POA: Diagnosis not present

## 2023-12-19 DIAGNOSIS — M0579 Rheumatoid arthritis with rheumatoid factor of multiple sites without organ or systems involvement: Secondary | ICD-10-CM | POA: Diagnosis not present

## 2023-12-19 DIAGNOSIS — I959 Hypotension, unspecified: Secondary | ICD-10-CM | POA: Diagnosis not present

## 2023-12-19 DIAGNOSIS — Z7984 Long term (current) use of oral hypoglycemic drugs: Secondary | ICD-10-CM | POA: Diagnosis not present

## 2023-12-19 DIAGNOSIS — I2510 Atherosclerotic heart disease of native coronary artery without angina pectoris: Secondary | ICD-10-CM | POA: Diagnosis not present

## 2023-12-19 DIAGNOSIS — I5023 Acute on chronic systolic (congestive) heart failure: Secondary | ICD-10-CM | POA: Diagnosis not present

## 2023-12-19 DIAGNOSIS — I252 Old myocardial infarction: Secondary | ICD-10-CM | POA: Diagnosis not present

## 2023-12-19 DIAGNOSIS — M4800 Spinal stenosis, site unspecified: Secondary | ICD-10-CM | POA: Diagnosis not present

## 2023-12-19 DIAGNOSIS — J9610 Chronic respiratory failure, unspecified whether with hypoxia or hypercapnia: Secondary | ICD-10-CM | POA: Diagnosis not present

## 2023-12-24 ENCOUNTER — Encounter: Payer: Self-pay | Admitting: Family Medicine

## 2023-12-24 ENCOUNTER — Ambulatory Visit (INDEPENDENT_AMBULATORY_CARE_PROVIDER_SITE_OTHER): Admitting: Family Medicine

## 2023-12-24 VITALS — BP 200/80 | HR 71 | Temp 97.9°F | Ht 68.0 in | Wt 178.0 lb

## 2023-12-24 DIAGNOSIS — I480 Paroxysmal atrial fibrillation: Secondary | ICD-10-CM | POA: Diagnosis not present

## 2023-12-24 DIAGNOSIS — E119 Type 2 diabetes mellitus without complications: Secondary | ICD-10-CM | POA: Diagnosis not present

## 2023-12-24 DIAGNOSIS — N179 Acute kidney failure, unspecified: Secondary | ICD-10-CM

## 2023-12-24 DIAGNOSIS — D649 Anemia, unspecified: Secondary | ICD-10-CM | POA: Diagnosis not present

## 2023-12-24 DIAGNOSIS — Z7984 Long term (current) use of oral hypoglycemic drugs: Secondary | ICD-10-CM | POA: Diagnosis not present

## 2023-12-24 DIAGNOSIS — I5032 Chronic diastolic (congestive) heart failure: Secondary | ICD-10-CM

## 2023-12-24 LAB — BAYER DCA HB A1C WAIVED: HB A1C (BAYER DCA - WAIVED): 5.9 % — ABNORMAL HIGH (ref 4.8–5.6)

## 2023-12-24 NOTE — Progress Notes (Signed)
 Subjective:  Patient ID: Connor Bryant, male    DOB: [birth year]  Age: 78 y.o. MRN: [id number]  CC: Hospitalization Follow-up (Still feels a little unsteady but feeling better. Not concerned.)   HPI Connor Bryant presents for recheck of recent hospitalization.  He was hospitalized at [facility] fromMarch 22 until March 24.  He was found to be in acute renal failure due to dehydration from overdiuresis.  This is shortly after having been hospitalized for fluid overload just a few weeks prior to that time.  He was taken off of most of his cardiac medications including Coreg diltiazem Aldactone and torsemide until he is followed as an outpatient by cardiology.  He continues to take Eliquis twice daily for atrial fibrillation.   Patient in for follow-up of atrial fibrillation. Patient denies any recent bouts of chest pain or palpitations. Additionally, patient is taking anticoagulants. Patient denies any recent excessive bleeding episodes including epistaxis, bleeding from the gums, genitalia, rectal bleeding or hematuria. Additionally there has been no excessive bruising.  Follow-up of diabetes. Patient does not check blood sugar at home Patient denies symptoms such as polyuria, polydipsia, excessive hunger, nausea No significant hypoglycemic spells noted. Medications as noted below. Taking them regularly without complication/adverse reaction being reported today.      12/24/2023    2:57 PM 12/04/2023    2:18 PM 10/21/2023    6:59 PM  Depression screen PHQ 2/9  Decreased Interest 0 1 1  Down, Depressed, Hopeless 0 0 0  PHQ - 2 Score 0 1 1  Altered sleeping 0 1   Tired, decreased energy 0 1   Change in appetite 0 1   Feeling bad or failure about yourself  0 1   Trouble concentrating 0 1   Moving slowly or fidgety/restless 0 1   Suicidal thoughts 0 0   PHQ-9 Score 0 7   Difficult doing work/chores Not difficult at all Not difficult at all Somewhat difficult     History Connor Bryant has a past medical history of Acute CVA (cerebrovascular accident) (HCC) (05/01/2020), Acute respiratory failure with hypoxia (HCC) (05/04/2020), Arthritis, Atrial fibrillation (HCC), DM type 2 (diabetes mellitus, type 2) (HCC), Endotracheally intubated, Gallstone, Heart murmur, HTN (hypertension), Hyperlipidemia, Myocardial infarct, old, Personal history of colonic polyp-adenoma (11/24/2013), Renal insufficiency, and Thoracic spine fracture (HCC).   He has a past surgical history that includes Debridement tennis elbow (Right); Tonsillectomy and adenoidectomy; Colonoscopy; Esophagogastroduodenoscopy; Mesenteric artery bypass (N/A, 03/10/2014); abdominal aortagram (N/A, 02/23/2014); vascualr surgery; Cataract extraction, bilateral; Esophagogastroduodenoscopy (egd) with propofol (N/A, 08/20/2023); biopsy (08/20/2023); RIGHT/LEFT HEART CATH AND CORONARY ANGIOGRAPHY (N/A, 09/11/2023); CORONARY STENT INTERVENTION (N/A, 09/16/2023); CORONARY ATHERECTOMY (N/A, 09/16/2023); Coronary Ultrasound/IVUS (N/A, 09/16/2023); CORONARY LITHOTRIPSY (N/A, 09/16/2023); and Esophagogastroduodenoscopy (N/A, 11/30/2023).   His family history includes AAA (abdominal aortic aneurysm) in his mother; Bladder Cancer in his father; Cancer in his father; Coronary artery disease in his father; Diabetes in his father and mother; Heart attack in his father; Heart disease in his father and mother; Hyperlipidemia in his father and mother; Hypertension in his father and mother; Stroke in his father.He reports that he quit smoking about 14 years ago. His smoking use included cigarettes. He has been exposed to tobacco smoke. He has never used smokeless tobacco. He reports that he does not currently use alcohol. He reports that he does not currently use drugs.    ROS Review of Systems  Constitutional: Negative.   HENT: Negative.    Eyes:  Negative for visual disturbance.  Respiratory:  Negative for cough and shortness of  breath.   Cardiovascular:  Negative for chest pain and leg swelling.  Gastrointestinal:  Negative for abdominal pain, diarrhea, nausea and vomiting.  Genitourinary:  Negative for difficulty urinating.  Musculoskeletal:  Negative for arthralgias and myalgias.  Skin:  Negative for rash.  Neurological:  Negative for headaches.  Psychiatric/Behavioral:  Negative for sleep disturbance.     Objective:  BP (!) 200/80   Pulse 71   Temp 97.9 F (36.6 C)   Ht 5\' 8"  (1.727 m)   Wt 178 lb (80.7 kg)   SpO2 97%   BMI 27.06 kg/m   BP Readings from Last 3 Encounters:  12/24/23 (!) 200/80  12/16/23 (!) 119/53  12/02/23 (!) 131/49    Wt Readings from Last 3 Encounters:  12/24/23 178 lb (80.7 kg)  12/14/23 170 lb (77.1 kg)  12/09/23 170 lb (77.1 kg)     Physical Exam Vitals reviewed.  Constitutional:      General: He is not in acute distress.    Appearance: He is well-developed.  HENT:     Head: Normocephalic and atraumatic.     Right Ear: External ear normal.     Left Ear: External ear normal.     Nose: Nose normal.     Mouth/Throat:     Pharynx: No oropharyngeal exudate or posterior oropharyngeal erythema.  Eyes:     Conjunctiva/sclera: Conjunctivae normal.     Pupils: Pupils are equal, round, and reactive to light.  Cardiovascular:     Rate and Rhythm: Normal rate and regular rhythm.     Heart sounds: Normal heart sounds. No murmur heard. Pulmonary:     Effort: Pulmonary effort is normal. No respiratory distress.     Breath sounds: Normal breath sounds. No wheezing or rales.  Abdominal:     Palpations: Abdomen is soft.     Tenderness: There is no abdominal tenderness.  Musculoskeletal:        General: Normal range of motion.     Cervical back: Normal range of motion and neck supple.  Skin:    General: Skin is warm and dry.  Neurological:     Mental Status: He is alert and oriented to person, place, and time.     Deep Tendon Reflexes: Reflexes are normal and symmetric.   Psychiatric:        Behavior: Behavior normal.        Thought Content: Thought content normal.        Judgment: Judgment normal.    A1c today equals 5.9  Assessment & Plan:  Acute on chronic anemia -     CBC with Differential/Platelet -     CMP14+EGFR  Chronic diastolic congestive heart failure (HCC) -     CBC with Differential/Platelet -     CMP14+EGFR  Diabetes mellitus treated with oral medication (HCC) -     Bayer DCA Hb A1c Waived  PAF (paroxysmal atrial fibrillation) (HCC)  Treated with oral anticoagulation.  Rate control with BiDil   Follow-up: Return in about 3 months (around 03/24/2024).  Mechele Claude, M.D.

## 2023-12-25 LAB — CMP14+EGFR
ALT: 14 IU/L (ref 0–44)
AST: 17 IU/L (ref 0–40)
Albumin: 3.9 g/dL (ref 3.8–4.8)
Alkaline Phosphatase: 156 IU/L — ABNORMAL HIGH (ref 44–121)
BUN/Creatinine Ratio: 11 (ref 10–24)
BUN: 14 mg/dL (ref 8–27)
Bilirubin Total: 0.5 mg/dL (ref 0.0–1.2)
CO2: 21 mmol/L (ref 20–29)
Calcium: 9 mg/dL (ref 8.6–10.2)
Chloride: 103 mmol/L (ref 96–106)
Creatinine, Ser: 1.23 mg/dL (ref 0.76–1.27)
Globulin, Total: 2.5 g/dL (ref 1.5–4.5)
Glucose: 108 mg/dL — ABNORMAL HIGH (ref 70–99)
Potassium: 4 mmol/L (ref 3.5–5.2)
Sodium: 141 mmol/L (ref 134–144)
Total Protein: 6.4 g/dL (ref 6.0–8.5)
eGFR: 60 mL/min/{1.73_m2} (ref >59)

## 2023-12-25 LAB — CBC WITH DIFFERENTIAL/PLATELET
Basophils Absolute: 0 10*3/uL (ref 0.0–0.2)
Basos: 1 %
EOS (ABSOLUTE): 0.2 10*3/uL (ref 0.0–0.4)
Eos: 5 %
Hematocrit: 32.2 % — ABNORMAL LOW (ref 37.5–51.0)
Hemoglobin: 10.2 g/dL — ABNORMAL LOW (ref 13.0–17.7)
Immature Grans (Abs): 0 10*3/uL (ref 0.0–0.1)
Immature Granulocytes: 1 %
Lymphocytes Absolute: 1.4 10*3/uL (ref 0.7–3.1)
Lymphs: 32 %
MCH: 28.7 pg (ref 26.6–33.0)
MCHC: 31.7 g/dL (ref 31.5–35.7)
MCV: 91 fL (ref 79–97)
Monocytes Absolute: 0.4 10*3/uL (ref 0.1–0.9)
Monocytes: 10 %
Neutrophils Absolute: 2.3 10*3/uL (ref 1.4–7.0)
Neutrophils: 51 %
Platelets: 235 10*3/uL (ref 150–450)
RBC: 3.55 x10E6/uL — ABNORMAL LOW (ref 4.14–5.80)
RDW: 15.7 % — ABNORMAL HIGH (ref 11.6–15.4)
WBC: 4.4 10*3/uL (ref 3.4–10.8)

## 2023-12-26 ENCOUNTER — Other Ambulatory Visit: Payer: Self-pay | Admitting: Family Medicine

## 2023-12-26 ENCOUNTER — Telehealth: Payer: Self-pay

## 2023-12-26 DIAGNOSIS — I5023 Acute on chronic systolic (congestive) heart failure: Secondary | ICD-10-CM | POA: Diagnosis not present

## 2023-12-26 DIAGNOSIS — M109 Gout, unspecified: Secondary | ICD-10-CM | POA: Diagnosis not present

## 2023-12-26 DIAGNOSIS — I480 Paroxysmal atrial fibrillation: Secondary | ICD-10-CM | POA: Diagnosis not present

## 2023-12-26 DIAGNOSIS — Z7984 Long term (current) use of oral hypoglycemic drugs: Secondary | ICD-10-CM | POA: Diagnosis not present

## 2023-12-26 DIAGNOSIS — D500 Iron deficiency anemia secondary to blood loss (chronic): Secondary | ICD-10-CM | POA: Diagnosis not present

## 2023-12-26 DIAGNOSIS — Z7901 Long term (current) use of anticoagulants: Secondary | ICD-10-CM | POA: Diagnosis not present

## 2023-12-26 DIAGNOSIS — M0579 Rheumatoid arthritis with rheumatoid factor of multiple sites without organ or systems involvement: Secondary | ICD-10-CM | POA: Diagnosis not present

## 2023-12-26 DIAGNOSIS — I2510 Atherosclerotic heart disease of native coronary artery without angina pectoris: Secondary | ICD-10-CM | POA: Diagnosis not present

## 2023-12-26 DIAGNOSIS — D631 Anemia in chronic kidney disease: Secondary | ICD-10-CM | POA: Diagnosis not present

## 2023-12-26 DIAGNOSIS — E1122 Type 2 diabetes mellitus with diabetic chronic kidney disease: Secondary | ICD-10-CM | POA: Diagnosis not present

## 2023-12-26 DIAGNOSIS — J9610 Chronic respiratory failure, unspecified whether with hypoxia or hypercapnia: Secondary | ICD-10-CM | POA: Diagnosis not present

## 2023-12-26 DIAGNOSIS — N1831 Chronic kidney disease, stage 3a: Secondary | ICD-10-CM | POA: Diagnosis not present

## 2023-12-26 DIAGNOSIS — I959 Hypotension, unspecified: Secondary | ICD-10-CM | POA: Diagnosis not present

## 2023-12-26 DIAGNOSIS — E785 Hyperlipidemia, unspecified: Secondary | ICD-10-CM | POA: Diagnosis not present

## 2023-12-26 DIAGNOSIS — I252 Old myocardial infarction: Secondary | ICD-10-CM | POA: Diagnosis not present

## 2023-12-26 DIAGNOSIS — Z955 Presence of coronary angioplasty implant and graft: Secondary | ICD-10-CM | POA: Diagnosis not present

## 2023-12-26 DIAGNOSIS — I130 Hypertensive heart and chronic kidney disease with heart failure and stage 1 through stage 4 chronic kidney disease, or unspecified chronic kidney disease: Secondary | ICD-10-CM | POA: Diagnosis not present

## 2023-12-26 DIAGNOSIS — M4800 Spinal stenosis, site unspecified: Secondary | ICD-10-CM | POA: Diagnosis not present

## 2023-12-26 NOTE — Telephone Encounter (Signed)
 Copied from CRM [id number]. Topic: Clinical - Home Health Verbal Orders >> Dec 26, 2023 11:03 AM Higinio Roger wrote: Caller/Agency: [facility] Callback Number: [phone] Service Requested: Physical Therapy Frequency: once a week for 3 weeks Any new concerns about the patient? No

## 2023-12-26 NOTE — Telephone Encounter (Signed)
 Verbal orders given. LS

## 2023-12-26 NOTE — Telephone Encounter (Signed)
 Yes, please.

## 2024-01-02 DIAGNOSIS — Z7901 Long term (current) use of anticoagulants: Secondary | ICD-10-CM | POA: Diagnosis not present

## 2024-01-02 DIAGNOSIS — I5023 Acute on chronic systolic (congestive) heart failure: Secondary | ICD-10-CM | POA: Diagnosis not present

## 2024-01-02 DIAGNOSIS — N1831 Chronic kidney disease, stage 3a: Secondary | ICD-10-CM | POA: Diagnosis not present

## 2024-01-02 DIAGNOSIS — E785 Hyperlipidemia, unspecified: Secondary | ICD-10-CM | POA: Diagnosis not present

## 2024-01-02 DIAGNOSIS — M4800 Spinal stenosis, site unspecified: Secondary | ICD-10-CM | POA: Diagnosis not present

## 2024-01-02 DIAGNOSIS — Z955 Presence of coronary angioplasty implant and graft: Secondary | ICD-10-CM | POA: Diagnosis not present

## 2024-01-02 DIAGNOSIS — Z7984 Long term (current) use of oral hypoglycemic drugs: Secondary | ICD-10-CM | POA: Diagnosis not present

## 2024-01-02 DIAGNOSIS — I480 Paroxysmal atrial fibrillation: Secondary | ICD-10-CM | POA: Diagnosis not present

## 2024-01-02 DIAGNOSIS — I252 Old myocardial infarction: Secondary | ICD-10-CM | POA: Diagnosis not present

## 2024-01-02 DIAGNOSIS — M109 Gout, unspecified: Secondary | ICD-10-CM | POA: Diagnosis not present

## 2024-01-02 DIAGNOSIS — D509 Iron deficiency anemia, unspecified: Secondary | ICD-10-CM | POA: Diagnosis not present

## 2024-01-02 DIAGNOSIS — J9621 Acute and chronic respiratory failure with hypoxia: Secondary | ICD-10-CM | POA: Diagnosis not present

## 2024-01-02 DIAGNOSIS — M0579 Rheumatoid arthritis with rheumatoid factor of multiple sites without organ or systems involvement: Secondary | ICD-10-CM | POA: Diagnosis not present

## 2024-01-02 DIAGNOSIS — I2510 Atherosclerotic heart disease of native coronary artery without angina pectoris: Secondary | ICD-10-CM | POA: Diagnosis not present

## 2024-01-02 DIAGNOSIS — E1122 Type 2 diabetes mellitus with diabetic chronic kidney disease: Secondary | ICD-10-CM | POA: Diagnosis not present

## 2024-01-02 DIAGNOSIS — M1990 Unspecified osteoarthritis, unspecified site: Secondary | ICD-10-CM | POA: Diagnosis not present

## 2024-01-02 DIAGNOSIS — I130 Hypertensive heart and chronic kidney disease with heart failure and stage 1 through stage 4 chronic kidney disease, or unspecified chronic kidney disease: Secondary | ICD-10-CM | POA: Diagnosis not present

## 2024-01-02 DIAGNOSIS — Z8673 Personal history of transient ischemic attack (TIA), and cerebral infarction without residual deficits: Secondary | ICD-10-CM | POA: Diagnosis not present

## 2024-01-06 ENCOUNTER — Ambulatory Visit: Admitting: Family Medicine

## 2024-01-06 ENCOUNTER — Encounter: Payer: Self-pay | Admitting: *Deleted

## 2024-01-06 ENCOUNTER — Encounter: Payer: Self-pay | Admitting: Family Medicine

## 2024-01-06 VITALS — BP 178/69 | HR 67 | Temp 97.8°F | Ht 68.0 in | Wt 179.0 lb

## 2024-01-06 DIAGNOSIS — G20C Parkinsonism, unspecified: Secondary | ICD-10-CM | POA: Diagnosis not present

## 2024-01-06 DIAGNOSIS — I10 Essential (primary) hypertension: Secondary | ICD-10-CM

## 2024-01-06 MED ORDER — CARBIDOPA-LEVODOPA 10-100 MG PO TABS: 1.0000 | ORAL_TABLET | Freq: Three times a day (TID) | ORAL | 5 refills | Status: AC

## 2024-01-06 NOTE — Progress Notes (Signed)
 Subjective:  Patient ID: Connor Bryant, male    DOB: Sep 22, 1946  Age: 78 y.o. MRN: [id number]  CC: Tremors (Can't handle a fork, can't write, and sometimes tremor/shaking in head. )   HPI Connor Bryant presents for tremor. Hands shaking. Food flies off his fork.  This has been known to occur in the past.  Apparently he was given something by Aniceto Boss a few years ago.  He said it made him so sick he could not take.  As a result he has not tried anything until now.  However, it the tremor started interfering with ADLs so he wants to have some treatment for that.   o    12/24/2023    2:57 PM 12/04/2023    2:18 PM 10/21/2023    6:59 PM  Depression screen PHQ 2/9  Decreased Interest 0 1 1  Down, Depressed, Hopeless 0 0 0  PHQ - 2 Score 0 1 1  Altered sleeping 0 1   Tired, decreased energy 0 1   Change in appetite 0 1   Feeling bad or failure about yourself  0 1   Trouble concentrating 0 1   Moving slowly or fidgety/restless 0 1   Suicidal thoughts 0 0   PHQ-9 Score 0 7   Difficult doing work/chores Not difficult at all Not difficult at all Somewhat difficult    History Connor Bryant has a past medical history of Acute CVA (cerebrovascular accident) (HCC) (05/01/2020), Acute respiratory failure with hypoxia (HCC) (05/04/2020), Arthritis, Atrial fibrillation (HCC), DM type 2 (diabetes mellitus, type 2) (HCC), Endotracheally intubated, Gallstone, Heart murmur, HTN (hypertension), Hyperlipidemia, Myocardial infarct, old, Personal history of colonic polyp-adenoma (11/24/2013), Renal insufficiency, and Thoracic spine fracture (HCC).   He has a past surgical history that includes Debridement tennis elbow (Right); Tonsillectomy and adenoidectomy; Colonoscopy; Esophagogastroduodenoscopy; Mesenteric artery bypass (N/A, 03/10/2014); abdominal aortagram (N/A, 02/23/2014); vascualr surgery; Cataract extraction, bilateral; Esophagogastroduodenoscopy (egd) with propofol (N/A, 08/20/2023); biopsy  (08/20/2023); RIGHT/LEFT HEART CATH AND CORONARY ANGIOGRAPHY (N/A, 09/11/2023); CORONARY STENT INTERVENTION (N/A, 09/16/2023); CORONARY ATHERECTOMY (N/A, 09/16/2023); Coronary Ultrasound/IVUS (N/A, 09/16/2023); CORONARY LITHOTRIPSY (N/A, 09/16/2023); and Esophagogastroduodenoscopy (N/A, 11/30/2023).   His family history includes AAA (abdominal aortic aneurysm) in his mother; Bladder Cancer in his father; Cancer in his father; Coronary artery disease in his father; Diabetes in his father and mother; Heart attack in his father; Heart disease in his father and mother; Hyperlipidemia in his father and mother; Hypertension in his father and mother; Stroke in his father.He reports that he quit smoking about 14 years ago. His smoking use included cigarettes. He has been exposed to tobacco smoke. He has never used smokeless tobacco. He reports that he does not currently use alcohol. He reports that he does not currently use drugs.    ROS Review of Systems  Constitutional: Negative.   HENT: Negative.    Eyes:  Negative for visual disturbance.  Respiratory:  Negative for cough and shortness of breath.   Cardiovascular:  Negative for chest pain and leg swelling.  Gastrointestinal:  Negative for abdominal pain, diarrhea, nausea and vomiting.  Genitourinary:  Negative for difficulty urinating.  Musculoskeletal:  Negative for arthralgias and myalgias.  Skin:  Negative for rash.  Neurological:  Positive for tremors. Negative for headaches.  Psychiatric/Behavioral:  Negative for sleep disturbance.     Objective:  BP (!) 178/69   Pulse 67   Temp 97.8 F (36.6 C)   Ht 5\' 8"  (1.727 m)   Wt 179 lb (81.2 kg)  SpO2 97%   BMI 27.22 kg/m   BP Readings from Last 3 Encounters:  01/06/24 (!) 178/69  12/24/23 (!) 200/80  12/16/23 (!) 119/53    Wt Readings from Last 3 Encounters:  01/06/24 179 lb (81.2 kg)  12/24/23 178 lb (80.7 kg)  12/14/23 170 lb (77.1 kg)     Physical Exam Vitals reviewed.   Constitutional:      Appearance: He is well-developed.  HENT:     Head: Normocephalic and atraumatic.     Right Ear: External ear normal.     Left Ear: External ear normal.     Mouth/Throat:     Pharynx: No oropharyngeal exudate or posterior oropharyngeal erythema.  Eyes:     Pupils: Pupils are equal, round, and reactive to light.  Cardiovascular:     Rate and Rhythm: Normal rate and regular rhythm.     Heart sounds: No murmur heard. Pulmonary:     Effort: No respiratory distress.     Breath sounds: Normal breath sounds.  Musculoskeletal:     Cervical back: Normal range of motion and neck supple.  Neurological:     General: No focal deficit present.     Mental Status: He is alert and oriented to person, place, and time.     Coordination: Coordination abnormal.     Comments: There is a marked rest tremor particularly of the left hand.  There is a slight tremor at the neck.  This has a typical parkinsonian pattern.  There is also cogwheeling at both wrists.  His gait is parkinsonian and that he is taking some small steps that seem to get slightly faster      Assessment & Plan:  Primary parkinsonism (HCC) -     Carbidopa-Levodopa; Take 1 tablet by mouth 3 (three) times daily. For parkinsonism  Dispense: 90 tablet; Refill: 5  Primary hypertension     Follow-up: Return in about 1 month (around 02/05/2024).  Roise Cleaver, M.D.

## 2024-01-08 ENCOUNTER — Ambulatory Visit: Payer: Medicare Other | Admitting: Cardiology

## 2024-01-08 ENCOUNTER — Ambulatory Visit: Attending: Cardiology | Admitting: Cardiology

## 2024-01-08 ENCOUNTER — Encounter: Payer: Self-pay | Admitting: Cardiology

## 2024-01-08 VITALS — BP 150/70 | HR 75 | Ht 68.0 in | Wt 182.0 lb

## 2024-01-08 DIAGNOSIS — I10 Essential (primary) hypertension: Secondary | ICD-10-CM | POA: Diagnosis not present

## 2024-01-08 DIAGNOSIS — D6869 Other thrombophilia: Secondary | ICD-10-CM | POA: Diagnosis not present

## 2024-01-08 DIAGNOSIS — Z8679 Personal history of other diseases of the circulatory system: Secondary | ICD-10-CM | POA: Diagnosis not present

## 2024-01-08 DIAGNOSIS — I480 Paroxysmal atrial fibrillation: Secondary | ICD-10-CM

## 2024-01-08 NOTE — Progress Notes (Signed)
 Clinical Summary Mr. Dorton is a 78 y.o.male seen today for follow up of the following medical problems.    1. NSTEMI/Stress induced CM - NSTEMI druing admission where he presented with CVA initially - peak trop 2500  - 04/2020 echo LVEF 35% (acute change from 05/02/20 echo) with apical akinesis. - possible acute plaque rupture vs coronary embolic event given presented with new onset afib and stroke vs. Stress induced CM/Takotsubo CM given echo findings - admission complicated by PE, hematuria, anemia, AKI given recent stroke and respiratory failure on ventilator did not pursue emergent cath   06/2020 echo LVEF 60-65%, no WMAs  03/2022 echo LVEF 60-65%      CAD s/p DESx2 to mLAD and ostial RCA 08/2023  - on eliquis  plavix , plavix  stopped after GI bleed - off beta blocker due to bradycardia -no chest pains, no SOB/DOE - compliant with meds     2. HFimpEF - new diagnosis during admission with CVA - - 04/2020 echo LVEF 35% (acute change from 05/02/20 echo) with apical akinesis.  - cath not pursued at that time due to CVA. Admission also complicated with PE, hematuria, anemia, AKI     06/2020 echo LVEF 60-65%, no WMAs - clincal scenario would suggest this may have been a stress induced CM in setting of his CVA 03/2022 echo LVEF 60-65%   - no recent SOB/DOE, no LE edema   03/2022 echo: LVEF 60-65%, grade I dd, mild to mod AS mean grad 28 AVA VTI 1.2 08/2023 echo: 30-35% 11/2023 echo: LVEF 55-60%, mod AS mean grad 17 AVA VTI 1.12   - no SOB/DOE. No recent edema   2. PAF - new diagnosis during admission with CVA - 07/2020 monitor without recurrent afib    - denies any palpitations - compliant with eliquis  - he reports recent DCCV during prior admission.      3. CVA - per pcp     4. PE - has been on eliquis    5. Anemia  - recent admission earlier this admission with GI bleed   6. PAD  s/p prior mesenteric artery bypass and recent bilateral iliac stenting [facility] on 04/27/2020 - now off plavix , had noted some excessive gum bleeding. Just on eliquis  - continues to f/u with vascualr, at 2/023 they had said just f/u as needed     7. Aortic stenosis - mild to moderate by 06/2020 echo, AVA VTI 1.18 and mean grad 16.  - no recent symptoms   03/2022 echo: LVEF 60-65%, grade I dd, mild to mod AS mean grad 28 AVA VTI 1.2 08/2023 echo: 30-35% 11/2023 echo: LVEF 55-60%, mod AS mean grad 17 AVA VTI 1.12   8. HTN -significant white coat HTN history - home bp's 120s/60s   9. Carotid stenosis - noted on MRA - due for repeat imaging US    03/2022 carotid US  RICA 1-39%, LICA 40-59% 08/2023 RICA 1-39%, LICA40-59%   10. CKD - followed by Dr Marlee   11.Hyperlipidemia - 01/2022 TC 151 TG 129 HDL 62 LDL 67 - he is on atorvastatin    04/2022 TC 150 TG 83 HDL 81 LDL 53   01/2023 TC 837 TG 83 HDL 62 LDL 84    12. Orthostatic hypotension - admit 11/2023 with AKI, orhostatic hypotension. Given IVFs - no orthostatic symptoms   SH: wifre recently passed this year 2024, COPD and pneumonia Past Medical History:  Diagnosis Date   Acute CVA (cerebrovascular accident) (HCC) 05/01/2020  Acute respiratory failure with hypoxia (HCC) 05/04/2020   Arthritis    Atrial fibrillation (HCC)    DM type 2 (diabetes mellitus, type 2) (HCC)    no meds   Endotracheally intubated    Gallstone    Heart murmur    was told one time he had a murmur, not since 30 years ago   HTN (hypertension)    Hyperlipidemia    Myocardial infarct, old    2021   Personal history of colonic polyp-adenoma 11/24/2013   Renal insufficiency    mild   Thoracic spine fracture (HCC)      No Known Allergies   Current Outpatient Medications  Medication Sig Dispense Refill   Accu-Chek Softclix Lancets lancets SMARTSIG:Topical 1-3 Times Daily     allopurinol  (ZYLOPRIM ) 100 MG tablet TAKE 1 TABLET 2 TO 3 TIMES A DAY FOR GOUT 90 tablet 0   apixaban  (ELIQUIS ) 5 MG TABS tablet Take 1 tablet  (5 mg total) by mouth 2 (two) times daily. 60 tablet 1   atorvastatin  (LIPITOR ) 80 MG tablet Take 1 tablet (80 mg total) by mouth daily. 90 tablet 3   Blood Glucose Monitoring Suppl (ACCU-CHEK GUIDE) w/Device KIT CHECK BLOOD SUGAR UP TO THREE TIMES DAILY AS DIRECTED     Blood Glucose Monitoring Suppl DEVI 1 each by Does not apply route in the morning, at noon, and at bedtime. May substitute to any manufacturer covered by patient's insurance. 1 each 0   Calcium  Carb-Cholecalciferol (CALCIUM  500 + D PO) Take 1 tablet by mouth daily.     carbidopa -levodopa  (SINEMET ) 10-100 MG tablet Take 1 tablet by mouth 3 (three) times daily. For parkinsonism 90 tablet 5   ferrous sulfate  325 (65 FE) MG tablet Take 1 tablet (325 mg total) by mouth daily.     isosorbide -hydrALAZINE  (BIDIL ) 20-37.5 MG tablet Take 1 tablet by mouth 2 (two) times daily. 60 tablet 0   pantoprazole  (PROTONIX ) 40 MG tablet TAKE ONE TABLET TWICE DAILY 60 tablet 2   No current facility-administered medications for this visit.     Past Surgical History:  Procedure Laterality Date   ABDOMINAL AORTAGRAM N/A 02/23/2014   Procedure: ABDOMINAL AORTAGRAM;  Surgeon: Redell LITTIE Door, MD;  Location: [facility] CATH LAB;  Service: Cardiovascular;  Laterality: N/A;   BIOPSY  08/20/2023   Procedure: BIOPSY;  Surgeon: Federico Rosario BROCKS, MD;  Location: [facility] ENDOSCOPY;  Service: Gastroenterology;;   CATARACT EXTRACTION, BILATERAL     COLONOSCOPY     CORONARY ATHERECTOMY N/A 09/16/2023   Procedure: CORONARY ATHERECTOMY;  Surgeon: Swaziland, Peter M, MD;  Location: [facility] INVASIVE CV LAB;  Service: Cardiovascular;  Laterality: N/A;   CORONARY LITHOTRIPSY N/A 09/16/2023   Procedure: CORONARY LITHOTRIPSY;  Surgeon: [place]aziland, Peter M, MD;  Location: [facility] INVASIVE CV LAB;  Service: Cardiovascular;  Laterality: N/A;   CORONARY STENT INTERVENTION N/A 09/16/2023   Procedure: CORONARY STENT INTERVENTION;  Surgeon: [place]il[place], Peter M, MD;  Location: [facility] INVASIVE CV LAB;  Service:  Cardiovascular;  Laterality: N/A;   CORONARY ULTRASOUND/IVUS N/A 09/16/2023   Procedure: Coronary Ultrasound/IVUS;  Surgeon: Swazil[place], Peter M, MD;  Location: [facility] INVASIVE CV LAB;  Service: Cardiovascular;  Laterality: N/A;   DEBRIDEMENT TENNIS ELBOW Right    ESOPHAGOGASTRODUODENOSCOPY     ESOPHAGOGASTRODUODENOSCOPY N/A 11/30/2023   Procedure: EGD (ESOPHAGOGASTRODUODENOSCOPY);  Surgeon: Eartha Flavors, Toribio, MD;  Location: [facility] ENDO SUITE;  Service: Gastroenterology;  Laterality: N/A;   ESOPHAGOGASTRODUODENOSCOPY (EGD) WITH PROPOFOL  N/A 08/20/2023   Procedure: ESOPHAGOGASTRODUODENOSCOPY (EGD) WITH PROPOFOL ;  Surgeon:  Federico Rosario BROCKS, MD;  Location: [facility] ENDOSCOPY;  Service: Gastroenterology;  Laterality: N/A;   MESENTERIC ARTERY BYPASS N/A 03/10/2014   Procedure: AORTO TO SUPERIOR MESENTERIC ARTERY BYPASS;  Surgeon: Lynwood JONETTA Collum, MD;  Location: [facility] OR;  Service: Vascular;  Laterality: N/A;   RIGHT/LEFT HEART CATH AND CORONARY ANGIOGRAPHY N/A 09/11/2023   Procedure: RIGHT/LEFT HEART CATH AND CORONARY ANGIOGRAPHY;  Surgeon: Rolan Ezra RAMAN, MD;  Location: [facility] INVASIVE CV LAB;  Service: Cardiovascular;  Laterality: N/A;   TONSILLECTOMY AND ADENOIDECTOMY     vascualr surgery     illiac stents     No Known Allergies    Family History  Problem Relation Age of Onset   Diabetes Mother    Heart disease Mother        before age 44   Hyperlipidemia Mother    Hypertension Mother    AAA (abdominal aortic aneurysm) Mother    Stroke Father    Hypertension Father    Coronary artery disease Father    Hyperlipidemia Father    Diabetes Father    Bladder Cancer Father    Cancer Father    Heart disease Father    Heart attack Father    Colon cancer Neg Hx    Throat cancer Neg Hx    Kidney disease Neg Hx    Liver disease Neg Hx      Social History Mr. Garfield reports that he quit smoking about 14 years ago. His smoking use included cigarettes. He has been exposed to tobacco smoke. He has never  used smokeless tobacco. Mr. Frick reports that he does not currently use alcohol.    Physical Examination Today's Vitals   01/08/24 1434  BP: (!) 150/70  Pulse: 75  SpO2: 96%  Weight: 182 lb (82.6 kg)  Height: 5' 8 (1.727 m)   Body mass index is 27.67 kg/m.  Gen: resting comfortably, no acute distress HEENT: no scleral icterus, pupils equal round and reactive, no palptable cervical adenopathy,  CV: RRR, no m/rg, no jvd Resp: Clear to auscultation bilaterally GI: abdomen is soft, non-tender, non-distended, normal bowel sounds, no hepatosplenomegaly MSK: extremities are warm, no edema.  Skin: warm, no rash Neuro:  no focal deficits Psych: appropriate affect   Diagnostic Studies 03/2022 carotid US  Summary:  Right Carotid: Velocities in the right ICA are consistent with a 1-39%  stenosis.                Non-hemodynamically significant plaque <50% noted in the  CCA. The                 ECA appears >50% stenosed.   Left Carotid: Velocities in the left ICA are consistent with a 40-59%  stenosis.               Non-hemodynamically significant plaque <50% noted in the  CCA. The                ECA appears >50% stenosed. Based on peak velocities,  turbulent               flow and plaque formation.   Vertebrals:  Bilateral vertebral arteries demonstrate antegrade flow.  Subclavians: Right subclavian artery was stenotic. Normal flow  hemodynamics were               seen in the left subclavian artery.       Assessment and Plan  1. History of cardiomyopathy - LVEF has normalized, likely stress induced CM - continue  current therapy   2.  HTN - white coat HTN history - bp elevated here but home numbers at goal, continue current therapy   3 Afib/Acquired thrombophilia - no recent symptoms, continue current meds   4. Aortic stenosis - mild to moderate by last echo - we will continue to monitor          Dorn PHEBE Ross, M.D.

## 2024-01-08 NOTE — Patient Instructions (Signed)
 Medication Instructions:  Continue all current medications.   Labwork: none  Testing/Procedures: none  Follow-Up: 6 months   Any Other Special Instructions Will Be Listed Below (If Applicable).   If you need a refill on your cardiac medications before your next appointment, please call your pharmacy.

## 2024-01-09 ENCOUNTER — Ambulatory Visit (INDEPENDENT_AMBULATORY_CARE_PROVIDER_SITE_OTHER)

## 2024-01-09 ENCOUNTER — Other Ambulatory Visit: Admitting: *Deleted

## 2024-01-09 DIAGNOSIS — J1000 Influenza due to other identified influenza virus with unspecified type of pneumonia: Secondary | ICD-10-CM

## 2024-01-09 DIAGNOSIS — I252 Old myocardial infarction: Secondary | ICD-10-CM

## 2024-01-09 DIAGNOSIS — I480 Paroxysmal atrial fibrillation: Secondary | ICD-10-CM

## 2024-01-09 DIAGNOSIS — M109 Gout, unspecified: Secondary | ICD-10-CM

## 2024-01-09 DIAGNOSIS — J9621 Acute and chronic respiratory failure with hypoxia: Secondary | ICD-10-CM

## 2024-01-09 DIAGNOSIS — E1122 Type 2 diabetes mellitus with diabetic chronic kidney disease: Secondary | ICD-10-CM

## 2024-01-09 DIAGNOSIS — I2510 Atherosclerotic heart disease of native coronary artery without angina pectoris: Secondary | ICD-10-CM

## 2024-01-09 DIAGNOSIS — M1990 Unspecified osteoarthritis, unspecified site: Secondary | ICD-10-CM | POA: Diagnosis not present

## 2024-01-09 DIAGNOSIS — M0579 Rheumatoid arthritis with rheumatoid factor of multiple sites without organ or systems involvement: Secondary | ICD-10-CM | POA: Diagnosis not present

## 2024-01-09 DIAGNOSIS — I130 Hypertensive heart and chronic kidney disease with heart failure and stage 1 through stage 4 chronic kidney disease, or unspecified chronic kidney disease: Secondary | ICD-10-CM

## 2024-01-09 DIAGNOSIS — I5023 Acute on chronic systolic (congestive) heart failure: Secondary | ICD-10-CM | POA: Diagnosis not present

## 2024-01-09 DIAGNOSIS — Z7984 Long term (current) use of oral hypoglycemic drugs: Secondary | ICD-10-CM | POA: Diagnosis not present

## 2024-01-09 DIAGNOSIS — Z955 Presence of coronary angioplasty implant and graft: Secondary | ICD-10-CM | POA: Diagnosis not present

## 2024-01-09 DIAGNOSIS — D509 Iron deficiency anemia, unspecified: Secondary | ICD-10-CM

## 2024-01-09 DIAGNOSIS — M4800 Spinal stenosis, site unspecified: Secondary | ICD-10-CM | POA: Diagnosis not present

## 2024-01-09 DIAGNOSIS — E785 Hyperlipidemia, unspecified: Secondary | ICD-10-CM | POA: Diagnosis not present

## 2024-01-09 DIAGNOSIS — N1831 Chronic kidney disease, stage 3a: Secondary | ICD-10-CM

## 2024-01-09 DIAGNOSIS — Z8673 Personal history of transient ischemic attack (TIA), and cerebral infarction without residual deficits: Secondary | ICD-10-CM | POA: Diagnosis not present

## 2024-01-09 DIAGNOSIS — Z7901 Long term (current) use of anticoagulants: Secondary | ICD-10-CM | POA: Diagnosis not present

## 2024-01-10 ENCOUNTER — Encounter: Payer: Self-pay | Admitting: *Deleted

## 2024-01-13 ENCOUNTER — Encounter (HOSPITAL_COMMUNITY): Payer: Self-pay | Admitting: Cardiology

## 2024-01-13 ENCOUNTER — Other Ambulatory Visit: Payer: Self-pay | Admitting: Family Medicine

## 2024-01-13 ENCOUNTER — Ambulatory Visit (HOSPITAL_COMMUNITY)
Admit: 2024-01-13 | Discharge: 2024-01-13 | Disposition: A | Discharge status: Home / Self Care | Attending: Cardiology | Admitting: Cardiology

## 2024-01-13 VITALS — BP 180/80 | HR 76 | Wt 181.8 lb

## 2024-01-13 DIAGNOSIS — Z8673 Personal history of transient ischemic attack (TIA), and cerebral infarction without residual deficits: Secondary | ICD-10-CM | POA: Diagnosis not present

## 2024-01-13 DIAGNOSIS — I4510 Unspecified right bundle-branch block: Secondary | ICD-10-CM | POA: Diagnosis not present

## 2024-01-13 DIAGNOSIS — I5022 Chronic systolic (congestive) heart failure: Secondary | ICD-10-CM | POA: Diagnosis not present

## 2024-01-13 DIAGNOSIS — I6522 Occlusion and stenosis of left carotid artery: Secondary | ICD-10-CM | POA: Insufficient documentation

## 2024-01-13 DIAGNOSIS — I480 Paroxysmal atrial fibrillation: Secondary | ICD-10-CM | POA: Diagnosis not present

## 2024-01-13 DIAGNOSIS — Z79899 Other long term (current) drug therapy: Secondary | ICD-10-CM | POA: Diagnosis not present

## 2024-01-13 DIAGNOSIS — I350 Nonrheumatic aortic (valve) stenosis: Secondary | ICD-10-CM | POA: Insufficient documentation

## 2024-01-13 DIAGNOSIS — Z955 Presence of coronary angioplasty implant and graft: Secondary | ICD-10-CM | POA: Diagnosis not present

## 2024-01-13 DIAGNOSIS — I739 Peripheral vascular disease, unspecified: Secondary | ICD-10-CM | POA: Diagnosis not present

## 2024-01-13 DIAGNOSIS — I4819 Other persistent atrial fibrillation: Secondary | ICD-10-CM | POA: Diagnosis not present

## 2024-01-13 DIAGNOSIS — I5042 Chronic combined systolic (congestive) and diastolic (congestive) heart failure: Secondary | ICD-10-CM | POA: Insufficient documentation

## 2024-01-13 DIAGNOSIS — N183 Chronic kidney disease, stage 3 unspecified: Secondary | ICD-10-CM | POA: Diagnosis not present

## 2024-01-13 DIAGNOSIS — Z7902 Long term (current) use of antithrombotics/antiplatelets: Secondary | ICD-10-CM | POA: Insufficient documentation

## 2024-01-13 DIAGNOSIS — I493 Ventricular premature depolarization: Secondary | ICD-10-CM | POA: Diagnosis not present

## 2024-01-13 DIAGNOSIS — E1122 Type 2 diabetes mellitus with diabetic chronic kidney disease: Secondary | ICD-10-CM | POA: Insufficient documentation

## 2024-01-13 DIAGNOSIS — Z87891 Personal history of nicotine dependence: Secondary | ICD-10-CM | POA: Diagnosis not present

## 2024-01-13 DIAGNOSIS — I2510 Atherosclerotic heart disease of native coronary artery without angina pectoris: Secondary | ICD-10-CM | POA: Insufficient documentation

## 2024-01-13 DIAGNOSIS — I130 Hypertensive heart and chronic kidney disease with heart failure and stage 1 through stage 4 chronic kidney disease, or unspecified chronic kidney disease: Secondary | ICD-10-CM | POA: Insufficient documentation

## 2024-01-13 LAB — COMPREHENSIVE METABOLIC PANEL WITH GFR
ALT: 12 U/L (ref 0–44)
AST: 18 U/L (ref 15–41)
Albumin: 3.4 g/dL — ABNORMAL LOW (ref 3.5–5.0)
Alkaline Phosphatase: 84 U/L (ref 38–126)
Anion gap: 11 (ref 5–15)
BUN: 17 mg/dL (ref 8–23)
CO2: 25 mmol/L (ref 22–32)
Calcium: 8.5 mg/dL — ABNORMAL LOW (ref 8.9–10.3)
Chloride: 105 mmol/L (ref 98–111)
Creatinine, Ser: 1.23 mg/dL (ref 0.61–1.24)
GFR, Estimated: 60 mL/min — ABNORMAL LOW (ref >60)
Glucose, Bld: 122 mg/dL — ABNORMAL HIGH (ref 70–99)
Potassium: 3.7 mmol/L (ref 3.5–5.1)
Sodium: 141 mmol/L (ref 135–145)
Total Bilirubin: 0.9 mg/dL (ref 0.0–1.2)
Total Protein: 6.6 g/dL (ref 6.5–8.1)

## 2024-01-13 LAB — CBC
HCT: 33.7 % — ABNORMAL LOW (ref 39.0–52.0)
Hemoglobin: 10.5 g/dL — ABNORMAL LOW (ref 13.0–17.0)
MCH: 28.3 pg (ref 26.0–34.0)
MCHC: 31.2 g/dL (ref 30.0–36.0)
MCV: 90.8 fL (ref 80.0–100.0)
Platelets: 216 10*3/uL (ref 150–400)
RBC: 3.71 MIL/uL — ABNORMAL LOW (ref 4.22–5.81)
RDW: 16.2 % — ABNORMAL HIGH (ref 11.5–15.5)
WBC: 6.4 10*3/uL (ref 4.0–10.5)
nRBC: 0 % (ref 0.0–0.2)

## 2024-01-13 LAB — BRAIN NATRIURETIC PEPTIDE: B Natriuretic Peptide: 358.5 pg/mL — ABNORMAL HIGH (ref 0.0–100.0)

## 2024-01-13 MED ORDER — CLOPIDOGREL BISULFATE 75 MG PO TABS: 75.0000 mg | ORAL_TABLET | Freq: Every day | ORAL | 3 refills | Status: DC

## 2024-01-13 MED ORDER — AMIODARONE HCL 200 MG PO TABS: 200.0000 mg | ORAL_TABLET | Freq: Every day | ORAL | 3 refills | Status: DC

## 2024-01-13 MED ORDER — CARVEDILOL 6.25 MG PO TABS: 6.2500 mg | ORAL_TABLET | Freq: Two times a day (BID) | ORAL | 3 refills | Status: DC

## 2024-01-13 MED ORDER — SPIRONOLACTONE 25 MG PO TABS: 12.5000 mg | ORAL_TABLET | Freq: Every day | ORAL | 3 refills | Status: DC

## 2024-01-13 MED ORDER — FUROSEMIDE 20 MG PO TABS
20.0000 mg | ORAL_TABLET | Freq: Every day | ORAL | 3 refills | Status: DC
Start: 2024-01-13 — End: 2024-02-04

## 2024-01-13 NOTE — Patient Instructions (Signed)
 START Amiodarone  200 mg daily.  START Plavix  75 mg daily.  START Spironolactone  12.5 mg ( 1/2 tab) daily.  START Carvedilol  6.25 mg Twice daily  START Lasix  20 mg daily.  STOP Eilquis  Labs done today, your results will be available in MyChart, we will contact you for abnormal readings.  Repeat blood work in 10 days at [facility].  You have been referred to Dr. Marven Slimmer. His office will call you to arrange your appointment.  Your physician recommends that you schedule a follow-up appointment in: 3 weeks.  If you have any questions or concerns before your next appointment please send us  a message through [place] or call our office at [phone].    TO LEAVE A MESSAGE FOR THE NURSE SELECT OPTION 2, PLEASE LEAVE A MESSAGE INCLUDING: YOUR NAME DATE OF BIRTH CALL BACK NUMBER REASON FOR CALL**this is important as we prioritize the call backs  YOU WILL RECEIVE A CALL BACK THE SAME DAY AS LONG AS YOU CALL BEFORE 4:00 PM  At the [facility], you and your health needs are our priority. As part of our continuing mission to provide you with exceptional heart care, we have created designated Provider Care Teams. These Care Teams include your primary Cardiologist (physician) and Advanced Practice Providers (APPs- Physician Assistants and Nurse Practitioners) who all work together to provide you with the care you need, when you need it.   You may see any of the following providers on your designated Care Team at your next follow up: Dr Jules Oar Dr Peder Bourdon Dr. Alwin Baars Dr. Arta Lark Amy Marijane Shoulders, NP Ruddy Corral, [place] [facility] [place], [place] Dennise Fitz, NP [place] Lee, NP Shawnee Dellen, NP Luster Salters, PharmD Bevely Brush, PharmD   Please be sure to bring in all your medications bottles to every appointment.    Thank you for choosing [facility]

## 2024-01-14 NOTE — Progress Notes (Signed)
 PCP: Roise Cleaver, MD Cardiology: Dr. Amanda Jungling HF Cardiology: Dr. Mitzie Anda  Chief complaint: CHF  78 y.o. with history of CVA, persistent atrial fibrillation, and systolic CHF was referred by Dr. Amanda Jungling for evaluation of CHF.  Patient was admitted in 8/21 with CVA. HS-TnI elevated at 2500, echo showed EF 35% with apical akinesis.  This admission was complicated by PE, atrial fibrillation, AKI, anemia.  Cath was not done with anemia and AKI.  However, echo in 10/21 showed EF back up to 60-65% with mild-moderate AS.  It was suspected that the fall in EF was due to a stress cardiomyopathy given improvement. Echo in 7/23 showed EF 60-65%, moderate AS with mean gradient 28 mmHg, AVA 1.2 cm^2 by VTI.    In 11/24, he was admitted to [facility] with anemia and hypotension, melena was present with hgb 7.5.  Creatinine up to 2.39.  He had 2 units PRBCs, and since still hypotensive, he was transferred to [facility]. Echo showed EF 30-35%, diffuse hypokinesis, normal RV, PASP 60 mmHg, mild-moderate MR, moderate AS. He was was in atrial fibrillation during this admission.  Low BP limited GDMT titration.  Amiodarone  was begun for rate control of AF.  EGD was done showing erosive gastritis.  Eliquis  was restarted at discharge.   Patient had LHC/RHC in 12/24, this showed 80% ostial RCA stenosis and 99% proximal LAD stenosis; RHC showed elevated right and left heart filling pressures.  He was admitted for diuresis and to evaluate for CABG vs PCI. He was deemed high risk for CABG, so PCI was done with DES to proximal LAD and ostial RCA. In the hospital, he was converted back into NSR.   He was readmitted in 3/25 with influenza A PNA and also concern for GI bleeding with worsening hemoglobin.  He had severe epistaxis as well.  He was on Plavix  + Eliquis .  Unfortunately, Plavix  was stopped at this time and Eliquis  was continued.  Repeat echo in 3/25 showed EF up to 55-60% with normal RV, moderate aortic stenosis with mean gradient  17 mmHg and AVA 1.12 cm^2.   Patient returns for followup of CHF.  He has had no further epistaxis and denies BRBPR/melena.  He is still on Eliquis  without Plavix .  No chest pain.  Breathing is improved, he is walking further.  Uses cane or walker, but denies dyspnea walking around the house. No lightheadedness.  No orthopnea/PND.  BP is high today.  He is currently off Plavix , amiodarone , Coreg , spironolactone , Farxiga , and Lasix .   He is worried about his BP as it has been running quite high.  He is only taking Bidil  1 tab bid.  He denies claudication.   ECG (personally reviewed): NSR, right axis deviation, old ASMI  Labs (8/24): LDL 54 Labs (12/24): K 4.4, creatinine 1.7, hgb 10.9, LFTs normal Labs (4/25): K 4, creatinine 1.23, hgb 10.2  PMH: 1. CVA: In 8/21, in setting of atrial fibrillation.  2. Type 2 diabetes 3. HTN 4. Hyperlipidemia 5. PAD: Mesenteric artery bypass in 6/15.  Bilateral iliac artery stents in 8/21 at [facility].  6. Carotid stenosis: Carotid dopplers in 7/23 with 40-59% LICA stenosis.  7. CKD stage 3 8. GI bleeding: 11/24, erosive gastritis.  9. Chronic systolic CHF: Echo in 8/21 in setting of CVA showed EF 35% with apical akinesis.  Repeat echo in 10/21 showed EF up to 60-65%, mild-moderate AS.  Suspect fall in EF in 8/21 was due to stress cardiomyopathy in setting of CVA.  -  Echo (7/23): EF 60-65%, moderate AS with mean gradient 28 mmHg, AVA 1.2 cm^2 by VTI.  - Echo (11/24) in setting of anemia and hypotension showed EF 30-35%, diffuse hypokinesis, normal RV, PASP 60 mmHg, mild-moderate MR, moderate AS.  - PCI to LAD and RCA in 12/24 (see below).  - RHC (12/24): mean RA 12, PA 57/23, mean PCWP 29, CI 2.23, PVR 1.2, PAPi 2.8.  - Echo (3/25): EF up to 55-60% with normal RV, moderate aortic stenosis with mean gradient 17 mmHg and AVA 1.12 cm^2.  10. Aortic stenosis: Moderate on last echo in 3/25.  11. PE: 8/21.  12. Atrial fibrillation: Paroxysmal.  13. CAD: LHC  (12/24) with 80% ostial RCA stenosis, 99% proximal LAD.  Patient was treated with DES to ostial RCA and proximal LAD.  14. Carotid stenosis: Carotid dopplers (12/24) with 40-59% LICA stenosis.   Family History  Problem Relation Age of Onset   Diabetes Mother    Heart disease Mother        before age 58   Hyperlipidemia Mother    Hypertension Mother    AAA (abdominal aortic aneurysm) Mother    Stroke Father    Hypertension Father    Coronary artery disease Father    Hyperlipidemia Father    Diabetes Father    Bladder Cancer Father    Cancer Father    Heart disease Father    Heart attack Father    Colon cancer Neg Hx    Throat cancer Neg Hx    Kidney disease Neg Hx    Liver disease Neg Hx    Social History   Socioeconomic History   Marital status: Widowed    Spouse name: Not on file   Number of children: 1   Years of education: Not on file   Highest education level: High school graduate  Occupational History   Occupation: Retired  Tobacco Use   Smoking status: Former    Current packs/day: 0.00    Types: Cigarettes    Quit date: 07/08/2009    Years since quitting: 14.5    Passive exposure: Past   Smokeless tobacco: Never  Vaping Use   Vaping status: Never Used  Substance and Sexual Activity   Alcohol use: Not Currently   Drug use: Not Currently   Sexual activity: Yes  Other Topics Concern   Not on file  Social History Narrative   Patient is married, he is retired. 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   Social Drivers of Health   Financial Resource Strain: Low Risk  (12/09/2023)   Overall Financial Resource Strain (CARDIA)    Difficulty of Paying Living Expenses: Not hard at all  Food Insecurity: No Food Insecurity (12/14/2023)   Hunger Vital Sign    Worried About Running Out of Food in the Last Year: Never true    Ran Out of Food in the Last Year: Never true  Transportation Needs: No Transportation Needs (12/14/2023)   PRAPARE - Therapist, art (Medical): No    Lack of Transportation (Non-Medical): No  Physical Activity: Inactive (12/04/2023)   Exercise Vital Sign    Days of Exercise per Week: 0 days    Minutes of Exercise per Session: 0 min  Stress: No Stress Concern Present (12/09/2023)   [employer] of Occupational Health - Occupational Stress Questionnaire    Feeling of Stress : Not at all  Social Connections: Moderately Isolated (12/14/2023)   Social Connection  and Isolation Panel [NHANES]    Frequency of Communication with Friends and Family: More than three times a week    Frequency of Social Gatherings with Friends and Family: More than three times a week    Attends Religious Services: Never    Database administrator or Organizations: Yes    Attends Banker Meetings: Never    Marital Status: Widowed  Intimate Partner Violence: Not At Risk (12/14/2023)   Humiliation, Afraid, Rape, and Kick questionnaire    Fear of Current or Ex-Partner: No    Emotionally Abused: No    Physically Abused: No    Sexually Abused: No   ROS: All systems reviewed and negative except as per HPI.   Current Outpatient Medications  Medication Sig Dispense Refill   Accu-Chek Softclix Lancets lancets SMARTSIG:Topical 1-3 Times Daily     allopurinol  (ZYLOPRIM ) 100 MG tablet Take 100 mg by mouth 2 (two) times daily.     amiodarone  (PACERONE ) 200 MG tablet Take 1 tablet (200 mg total) by mouth daily. 90 tablet 3   atorvastatin  (LIPITOR ) 80 MG tablet Take 1 tablet (80 mg total) by mouth daily. 90 tablet 3   Blood Glucose Monitoring Suppl (ACCU-CHEK GUIDE) w/Device KIT CHECK BLOOD SUGAR UP TO THREE TIMES DAILY AS DIRECTED     Blood Glucose Monitoring Suppl DEVI 1 each by Does not apply route in the morning, at noon, and at bedtime. May substitute to any manufacturer covered by patient's insurance. 1 each 0   Calcium  Carb-Cholecalciferol (CALCIUM  500 + D PO) Take 1 tablet by mouth daily.      carbidopa -levodopa  (SINEMET ) 10-100 MG tablet Take 1 tablet by mouth 3 (three) times daily. For parkinsonism 90 tablet 5   carvedilol  (COREG ) 6.25 MG tablet Take 1 tablet (6.25 mg total) by mouth 2 (two) times daily. 180 tablet 3   clopidogrel  (PLAVIX ) 75 MG tablet Take 1 tablet (75 mg total) by mouth daily. 90 tablet 3   ferrous sulfate  325 (65 FE) MG tablet Take 1 tablet (325 mg total) by mouth daily.     furosemide  (LASIX ) 20 MG tablet Take 1 tablet (20 mg total) by mouth daily. 90 tablet 3   isosorbide -hydrALAZINE  (BIDIL ) 20-37.5 MG tablet TAKE 1 TABLET 2 TIMES A DAY 60 tablet 0   pantoprazole  (PROTONIX ) 40 MG tablet TAKE ONE TABLET TWICE DAILY 60 tablet 2   spironolactone  (ALDACTONE ) 25 MG tablet Take 0.5 tablets (12.5 mg total) by mouth daily. 45 tablet 3   No current facility-administered medications for this encounter.   BP (!) 180/80   Pulse 76   Wt 82.5 kg (181 lb 12.8 oz)   SpO2 97%   BMI 27.64 kg/m  General: NAD Neck: JVP 8-9 cm, no thyromegaly or thyroid  nodule.  Lungs: Clear to auscultation bilaterally with normal respiratory effort. CV: Nondisplaced PMI.  Heart regular S1/S2, no S3/S4, 2/6 SEM RUSB with clear S2.  1+ edema 1/2 to knees bilaterally.  No carotid bruit.  Difficult to palpate pedal pulses.  Abdomen: Soft, nontender, no hepatosplenomegaly, no distention.  Skin: Intact without lesions or rashes.  Neurologic: Alert and oriented x 3.  Psych: Normal affect. Extremities: No clubbing or cyanosis.  HEENT: Normal.   Assessment/Plan: 1. Chronic systolic => diastolic CHF: Drop in EF in 8/21 to 35% with apical akinesis in setting CVA with improvement to normal EF by 10/21.  This was thought to be due to stress cardiomyopathy.  Echo in 11/24 showed EF back down to  EF 30-35% with diffuse hypokinesis, normal RV, PASP 60 mmHg, mild-moderate MR, moderate AS. LHC in 12/24 showed severe ostial RCA/proximal LAD stenosis.  This was treated with DES to RCA and LAD.  Repeat echo in  3/25 showed EF up to 55-60% with normal RV, moderate aortic stenosis with mean gradient 17 mmHg and AVA 1.12 cm^2.  Patient has been off Lasix ; he is volume overloaded on exam today.  NYHA class II symptoms.  - Restart Lasix  20 mg daily. BMET/BNP today, BMET in 10 days.  - Restart spironolactone  12.5 daily with diastolic CHF and elevated BP.  - Would aim to restart Farxiga  next appointment.   2. Atrial fibrillation: Persistent.  He is back on Eliquis  but has had recurrent GI bleeding from erosive gastritis. He was started on amiodarone  at 11/24 admission. He is in NSR today.  No current overt bleeding.  Ideally, he would be on both Eliquis  + Plavix  given AF and stents to proximal RCA and LAD in 12/24.  Unfortunately, Plavix  has been stopped.  He is very reluctant to restart Plavix  given epistaxis and GI bleeding.   - I suspect that he is not going to be able to take both Plavix  and Eliquis  due to bleeding issues.  Given the recent stents (12/24), I think that it is imperative for him to be back on Plavix  75 mg daily. To accomplish this, I am going to stop Eliquis  for now.    - Restart amiodarone  200 mg daily, I am not sure why he is off this.  Check LFTs and TSH today.  He will need a regular eye exam.  - Refer to EP to consider both Watchman and AF ablation in future.  3. CVA: 8/21, suspect due to atrial fibrillation.   - Unfortunately, think we are going to need to make the trade-off between Eliquis  and Plavix  for the time being.  We had a long discussion about this today. I do not think he is going to be able to take both due to bleeding (stopped Plavix  soon after stents and continues on Eliquis ).  Therefore, for the time being, with relatively new stents to proximal RCA and LAD, he will take Plavix  75 mg daily and stay off Eliquis .  After 6 mos-1 year, will stop Plavix  and start back on Eliquis . Ultimately, would like Watchman.  4. PE: In 8/21.   - As above, holding Eliquis  for now.  5. Aortic  stenosis: AS looked moderate on 3/25 echo.  S2 can be heard clearly.  - follow over time.  6. PAD: H/o iliac stents and mesenteric artery bypass.  No claudication or intestinal angina. No pedal ulcerations.  - Continue statin, check lipids next appt.  7. Carotid stenosis: 40-59% LICA stenosis on dopplers in 12/24, repeat 12/25.  8. GI bleeding: 11/24 GI bleeding from erosive esophagitis. Recurrent GI bleeding in 3/25, no significant findings on EGD.   - Avoid ETOH.  - Continue PPI.  - CBC today.  - See discussion above regarding Plavix  and Eliquis .   9. CKD stage 3: Follow BMET carefully.   - Eventually restart Farxiga .  10. HTN: BP has been quite high recently.  He is only taking Bidil  1 tab bid.  - Restart Coreg  6.25 mg bid.  - Restart spironolactone  12.5 daily.  - Eevntually, would replace Bidil  with ARB or Entresto  depending on trend in renal function.  11. CAD: 12/24 cath with 80% pRCA and 99% pLAD, treated with DES x 2.  Suspect ischemic cardiomyopathy  given improvement on 3/25 echo.  Unfortunately, Plavix  was stopped not long after PCI, I did not realize that it was stopped.  He had severe epistaxis as well as suspected GI bleeding with recurrent anemia on combination of Plavix  + Eliquis .   - Difficult choice in this situation, but given relatively recent stents, I would rather have him on Plavix  75 mg daily.  I do not think that he is going to be able to tolerate the combination of Plavix  and Eliquis  (and he refuses to restart Plavix  unless Eliquis  is stopped). Therefore, for now, will have him stop Eliquis  and restart Plavix .  6-12 months post-PCI, will revisit transition back from Plavix  to Eliquis  alone.  As above, ultimately think he needs to get a Watchman device.  - Continue atorvastatin , check lipids next appointment.   Followup in 3 wks with APP. Suspect he will need adjustments of BP active meds.   I spent 41 minutes reviewing data, interviewing patient, speaking with colleague  about him, and organizing the orders/followup.   Peder Bourdon 01/14/2024

## 2024-01-16 ENCOUNTER — Other Ambulatory Visit: Payer: Self-pay

## 2024-01-16 ENCOUNTER — Other Ambulatory Visit: Payer: Self-pay | Admitting: *Deleted

## 2024-01-16 DIAGNOSIS — N1831 Chronic kidney disease, stage 3a: Secondary | ICD-10-CM | POA: Diagnosis not present

## 2024-01-16 DIAGNOSIS — Z8673 Personal history of transient ischemic attack (TIA), and cerebral infarction without residual deficits: Secondary | ICD-10-CM | POA: Diagnosis not present

## 2024-01-16 DIAGNOSIS — M0579 Rheumatoid arthritis with rheumatoid factor of multiple sites without organ or systems involvement: Secondary | ICD-10-CM | POA: Diagnosis not present

## 2024-01-16 DIAGNOSIS — E785 Hyperlipidemia, unspecified: Secondary | ICD-10-CM | POA: Diagnosis not present

## 2024-01-16 DIAGNOSIS — I252 Old myocardial infarction: Secondary | ICD-10-CM | POA: Diagnosis not present

## 2024-01-16 DIAGNOSIS — M1990 Unspecified osteoarthritis, unspecified site: Secondary | ICD-10-CM | POA: Diagnosis not present

## 2024-01-16 DIAGNOSIS — Z955 Presence of coronary angioplasty implant and graft: Secondary | ICD-10-CM | POA: Diagnosis not present

## 2024-01-16 DIAGNOSIS — M109 Gout, unspecified: Secondary | ICD-10-CM | POA: Diagnosis not present

## 2024-01-16 DIAGNOSIS — M4800 Spinal stenosis, site unspecified: Secondary | ICD-10-CM | POA: Diagnosis not present

## 2024-01-16 DIAGNOSIS — I5023 Acute on chronic systolic (congestive) heart failure: Secondary | ICD-10-CM | POA: Diagnosis not present

## 2024-01-16 DIAGNOSIS — J9621 Acute and chronic respiratory failure with hypoxia: Secondary | ICD-10-CM | POA: Diagnosis not present

## 2024-01-16 DIAGNOSIS — D509 Iron deficiency anemia, unspecified: Secondary | ICD-10-CM | POA: Diagnosis not present

## 2024-01-16 DIAGNOSIS — I130 Hypertensive heart and chronic kidney disease with heart failure and stage 1 through stage 4 chronic kidney disease, or unspecified chronic kidney disease: Secondary | ICD-10-CM | POA: Diagnosis not present

## 2024-01-16 DIAGNOSIS — Z7901 Long term (current) use of anticoagulants: Secondary | ICD-10-CM | POA: Diagnosis not present

## 2024-01-16 DIAGNOSIS — E1122 Type 2 diabetes mellitus with diabetic chronic kidney disease: Secondary | ICD-10-CM | POA: Diagnosis not present

## 2024-01-16 DIAGNOSIS — I2510 Atherosclerotic heart disease of native coronary artery without angina pectoris: Secondary | ICD-10-CM | POA: Diagnosis not present

## 2024-01-16 DIAGNOSIS — I480 Paroxysmal atrial fibrillation: Secondary | ICD-10-CM | POA: Diagnosis not present

## 2024-01-16 DIAGNOSIS — Z7984 Long term (current) use of oral hypoglycemic drugs: Secondary | ICD-10-CM | POA: Diagnosis not present

## 2024-01-16 NOTE — Patient Outreach (Signed)
 Complex Care Management   Visit Note  01/16/2024  Name:  Connor Bryant MRN: [id number] DOB: 01/07/1946  Situation: Referral received for Complex Care Management related to Heart Failure I obtained verbal consent from Patient.  Visit completed with patient  on the phone  Background:   Past Medical History:  Diagnosis Date   Acute CVA (cerebrovascular accident) (HCC) 05/01/2020   Acute respiratory failure with hypoxia (HCC) 05/04/2020   Arthritis    Atrial fibrillation (HCC)    DM type 2 (diabetes mellitus, type 2) (HCC)    no meds   Endotracheally intubated    Gallstone    Heart murmur    was told one time he had a murmur, not since 30 years ago   HTN (hypertension)    Hyperlipidemia    Myocardial infarct, old    2021   Personal history of colonic polyp-adenoma 11/24/2013   Renal insufficiency    mild   Thoracic spine fracture (HCC)     Assessment: Patient Reported Symptoms:  Cognitive Cognitive Status: Alert and oriented to person, place, and time Cognitive/Intellectual Conditions Management [RPT]: None reported or documented in medical history or problem list   Health Maintenance Behaviors: Annual physical exam Healing Pattern: Average Health Facilitated by: Rest  Neurological Neurological Review of Symptoms: No symptoms reported Neurological Self-Management Outcome: 4 (good)  HEENT HEENT Symptoms Reported: No symptoms reported HEENT Self-Management Outcome: 4 (good)    Cardiovascular Cardiovascular Symptoms Reported: No symptoms reported Does patient have uncontrolled Hypertension?: No Cardiovascular Conditions: Heart failure Cardiovascular Management Strategies: Medication therapy, Routine screening, Adequate rest Weight: 179 lb (81.2 kg) Cardiovascular Self-Management Outcome: 4 (good)  Respiratory Respiratory Symptoms Reported: No symptoms reported Respiratory Self-Management Outcome: 4 (good)  Endocrine Patient reports the following symptoms related to  hypoglycemia or hyperglycemia : No symptoms reported Is patient diabetic?: Yes Is patient checking blood sugars at home?: No Endocrine Conditions: Diabetes Endocrine Management Strategies: Diet modification Endocrine Self-Management Outcome: 4 (good)  Gastrointestinal Gastrointestinal Symptoms Reported: No symptoms reported Gastrointestinal Self-Management Outcome: 4 (good) Nutrition Risk Screen (CP): No indicators present  Genitourinary Genitourinary Symptoms Reported: No symptoms reported Genitourinary Self-Management Outcome: 4 (good)  Integumentary Integumentary Symptoms Reported: Wound Additional Integumentary Details: Skin tear to left arm Skin Self-Management Outcome: 4 (good)  Musculoskeletal Musculoskelatal Symptoms Reviewed: No symptoms reported Musculoskeletal Self-Management Outcome: 4 (good) Falls in the past year?: Yes Number of falls in past year: 1 or less Was there an injury with Fall?: No Fall Risk Category Calculator: 1 Patient Fall Risk Level: Low Fall Risk Patient at Risk for Falls Due to: History of fall(s), Impaired mobility Fall risk Follow up: Falls evaluation completed, Falls prevention discussed, Education provided  Psychosocial       Quality of Family Relationships: involved, helpful, supportive Do you feel physically threatened by others?: No      01/16/2024    2:38 PM  Depression screen PHQ 2/9  Decreased Interest 0  Down, Depressed, Hopeless 0  PHQ - 2 Score 0    Vitals:   01/16/24 1300  BP: 135/65    Medications Reviewed Today     Reviewed by Remona Carmel, RN (Registered Nurse) on 01/16/24 at 1430  Med List Status: <None>   Medication Order Taking? Sig Documenting Provider Last Dose Status Informant  Accu-Chek Softclix Lancets lancets [id number] Yes SMARTSIG:Topical 1-3 Times Daily [provider] Taking Active   allopurinol  (ZYLOPRIM ) 100 MG tablet [id number] Yes Take 100 mg by mouth 2 (two) times daily. [provider] Taking Active   amiodarone  (PACERONE ) 200 MG tablet [id number] Yes Take 1 tablet (200 mg total) by mouth daily. Darlis Eisenmenger, MD Taking Active   atorvastatin  (LIPITOR ) 80 MG tablet [id number] Yes Take 1 tablet (80 mg total) by mouth daily. Roise Cleaver, MD Taking Active            Med Note Author Board, Aneta Keepers   Sat Dec 14, 2023  2:58 PM)    Blood Glucose Monitoring Suppl (ACCU-CHEK GUIDE) w/Device Suzanne Erps [id number] Yes CHECK BLOOD SUGAR UP TO THREE TIMES DAILY AS DIRECTED [provider] Taking Active   Blood Glucose Monitoring Suppl DEVI [id number] Yes 1 each by Does not apply route in the morning, at noon, and at bedtime. May substitute to any manufacturer covered by patient's insurance. Roise Cleaver, MD Taking Active Self  Calcium  Carb-Cholecalciferol (CALCIUM  500 + D PO) [id number] Yes Take 1 tablet by mouth daily. [provider] Taking Active Self  carbidopa -levodopa  (SINEMET ) 10-100 MG tablet [id number] Yes Take 1 tablet by mouth 3 (three) times daily. For parkinsonism Roise Cleaver, MD Taking Active   carvedilol  (COREG ) 6.25 MG tablet [id number] Yes Take 1 tablet (6.25 mg total) by mouth 2 (two) times daily. Darlis Eisenmenger, MD Taking Active   clopidogrel  (PLAVIX ) 75 MG tablet [id number] Yes Take 1 tablet (75 mg total) by mouth daily. Darlis Eisenmenger, MD Taking Active   ferrous sulfate  325 (65 FE) MG tablet [id number] Yes Take 1 tablet (325 mg total) by mouth daily. Lee, [place], NP Taking Active   furosemide  (LASIX ) 20 MG tablet [id number] Yes Take 1 tablet (20 mg total) by mouth daily. Darlis Eisenmenger, MD Taking Active   isosorbide -hydrALAZINE  (BIDIL ) 20-37.5 MG tablet [id number] Yes TAKE 1 TABLET 2 TIMES A DAY Stacks, Warren, MD Taking Active   pantoprazole  (PROTONIX ) 40 MG tablet [id number] Yes TAKE ONE TABLET TWICE DAILY Roise Cleaver, MD Taking Active   spironolactone  (ALDACTONE ) 25 MG tablet [id number] Yes Take 0.5 tablets (12.5 mg total) by mouth daily.  Darlis Eisenmenger, MD Taking Active             Recommendation:   PCP Follow-up  Follow Up Plan:   Telephone follow up appointment date/time:  02-16-2024 at 2:00 pm  Grandville Lax, BSN RN [facility] RN Care Manager Direct Dial: [phone]  Fax: [phone]

## 2024-01-16 NOTE — Patient Instructions (Addendum)
 Visit Information  Thank you for taking time to visit with me today. Please don't hesitate to contact me if I can be of assistance to you before our next scheduled appointment.  Your next care management appointment is by telephone on 02-16-2024 at 2:00 pm  Telephone follow-up in 1 month  Please call the care guide team at [phone] if you need to cancel, schedule, or reschedule an appointment.   Please call the Suicide and Crisis Lifeline: 988 call the [place]  National Suicide Prevention Lifeline: (306)594-4678 or TTY: 319-681-2438 TTY 302-448-5323) to talk to a trained counselor call 1-800-273-TALK (toll free, 24 hour hotline) call the [facility]: (249)837-4364 call 911 if you are experiencing a Mental Health or Behavioral Health Crisis or need someone to talk to.  Grandville Lax, BSN RN [place]  [facility], [facility] RN Care Manager Direct Dial: [phone]  Fax: [phone]    Heart Failure Action Plan A heart failure action plan helps you know what to do when you have symptoms of heart failure. Your action plan is a color-coded plan that lists the symptoms to watch for and indicates what actions to take. If you have symptoms in the green zone, you're doing well. If you have symptoms in the yellow zone, you're having problems. If you have symptoms in the red zone, you need medical care right away. Follow the plan that was created by you and your health care provider. Review your plan each time you visit your provider. Green zone These signs mean you're doing well and can continue what you're doing: You don't have new or worsening shortness of breath. You have very little swelling or no new swelling. Your weight is stable (no gain or loss). You have a normal activity level. You don't have chest pain or any other new symptoms. Yellow zone These signs and symptoms mean your condition may be getting worse and you should make some  changes: You have trouble breathing when you're active. You have swelling in your feet or legs or have discomfort in your belly. You gain 2-3 lb (0.9-1.4 kg) in 24 hours, or 5 lb (2.3 kg) in a week. This amount may be more or less depending on your condition. You get tired easily. You have trouble sleeping. You have a dry cough. If you have any of these symptoms: Contact your provider within the next day. Your provider may adjust your medicines. Red zone These signs and symptoms mean you should get medical help right away: You have trouble breathing when resting or cannot lie flat and you need to raise your head to help you breathe. You have a dry cough that's getting worse. You have swelling or pain in your feet or legs or discomfort in your belly that's getting worse. You suddenly gain more than 2-3 lb (0.9-1.4 kg) in 24 hours, or more than 5 lb (2.3 kg) in a week. This amount may be more or less depending on your condition. You have trouble staying awake or you feel confused. You don't have an appetite. You have worsening sadness or depression. These symptoms may be an emergency. Call 911 right away. Do not wait to see if the symptoms will go away. Do not drive yourself to the hospital. Follow these instructions at home: Take medicines only as told. Eat a heart-healthy diet. Work with a dietitian to create an eating plan that's best for you. Weigh yourself each day. Your target weight is __________ lb (__________ kg). Call your provider  if you gain more than __________ lb (__________ kg) in 24 hours, or more than __________ lb (__________ kg) in a week. Health care provider name: _____________________________________________________ Health care provider phone number: _____________________________________________________ Where to find more information American Heart Association: heart.org This information is not intended to replace advice given to you by your health care provider.  Make sure you discuss any questions you have with your health care provider. Document Revised: 04/25/2023 Document Reviewed: 04/25/2023 Elsevier Patient Education  2024 [employer].

## 2024-01-20 DIAGNOSIS — I130 Hypertensive heart and chronic kidney disease with heart failure and stage 1 through stage 4 chronic kidney disease, or unspecified chronic kidney disease: Secondary | ICD-10-CM | POA: Diagnosis not present

## 2024-01-20 DIAGNOSIS — J9621 Acute and chronic respiratory failure with hypoxia: Secondary | ICD-10-CM | POA: Diagnosis not present

## 2024-01-20 DIAGNOSIS — M0579 Rheumatoid arthritis with rheumatoid factor of multiple sites without organ or systems involvement: Secondary | ICD-10-CM | POA: Diagnosis not present

## 2024-01-20 DIAGNOSIS — M4800 Spinal stenosis, site unspecified: Secondary | ICD-10-CM | POA: Diagnosis not present

## 2024-01-20 DIAGNOSIS — I2510 Atherosclerotic heart disease of native coronary artery without angina pectoris: Secondary | ICD-10-CM | POA: Diagnosis not present

## 2024-01-20 DIAGNOSIS — I252 Old myocardial infarction: Secondary | ICD-10-CM | POA: Diagnosis not present

## 2024-01-20 DIAGNOSIS — D509 Iron deficiency anemia, unspecified: Secondary | ICD-10-CM | POA: Diagnosis not present

## 2024-01-20 DIAGNOSIS — Z7984 Long term (current) use of oral hypoglycemic drugs: Secondary | ICD-10-CM | POA: Diagnosis not present

## 2024-01-20 DIAGNOSIS — E785 Hyperlipidemia, unspecified: Secondary | ICD-10-CM | POA: Diagnosis not present

## 2024-01-20 DIAGNOSIS — Z955 Presence of coronary angioplasty implant and graft: Secondary | ICD-10-CM | POA: Diagnosis not present

## 2024-01-20 DIAGNOSIS — I5023 Acute on chronic systolic (congestive) heart failure: Secondary | ICD-10-CM | POA: Diagnosis not present

## 2024-01-20 DIAGNOSIS — Z8673 Personal history of transient ischemic attack (TIA), and cerebral infarction without residual deficits: Secondary | ICD-10-CM | POA: Diagnosis not present

## 2024-01-20 DIAGNOSIS — E1122 Type 2 diabetes mellitus with diabetic chronic kidney disease: Secondary | ICD-10-CM | POA: Diagnosis not present

## 2024-01-20 DIAGNOSIS — Z7901 Long term (current) use of anticoagulants: Secondary | ICD-10-CM | POA: Diagnosis not present

## 2024-01-20 DIAGNOSIS — N1831 Chronic kidney disease, stage 3a: Secondary | ICD-10-CM | POA: Diagnosis not present

## 2024-01-20 DIAGNOSIS — M109 Gout, unspecified: Secondary | ICD-10-CM | POA: Diagnosis not present

## 2024-01-20 DIAGNOSIS — M1990 Unspecified osteoarthritis, unspecified site: Secondary | ICD-10-CM | POA: Diagnosis not present

## 2024-01-20 DIAGNOSIS — I480 Paroxysmal atrial fibrillation: Secondary | ICD-10-CM | POA: Diagnosis not present

## 2024-01-24 DIAGNOSIS — N1831 Chronic kidney disease, stage 3a: Secondary | ICD-10-CM | POA: Diagnosis not present

## 2024-01-27 DIAGNOSIS — I739 Peripheral vascular disease, unspecified: Secondary | ICD-10-CM | POA: Diagnosis not present

## 2024-01-27 DIAGNOSIS — N1831 Chronic kidney disease, stage 3a: Secondary | ICD-10-CM | POA: Diagnosis not present

## 2024-01-27 DIAGNOSIS — I2510 Atherosclerotic heart disease of native coronary artery without angina pectoris: Secondary | ICD-10-CM | POA: Diagnosis not present

## 2024-01-27 DIAGNOSIS — I639 Cerebral infarction, unspecified: Secondary | ICD-10-CM | POA: Diagnosis not present

## 2024-01-27 DIAGNOSIS — I5020 Unspecified systolic (congestive) heart failure: Secondary | ICD-10-CM | POA: Diagnosis not present

## 2024-01-27 DIAGNOSIS — I129 Hypertensive chronic kidney disease with stage 1 through stage 4 chronic kidney disease, or unspecified chronic kidney disease: Secondary | ICD-10-CM | POA: Diagnosis not present

## 2024-01-27 DIAGNOSIS — E872 Acidosis, unspecified: Secondary | ICD-10-CM | POA: Diagnosis not present

## 2024-01-30 DIAGNOSIS — M0579 Rheumatoid arthritis with rheumatoid factor of multiple sites without organ or systems involvement: Secondary | ICD-10-CM | POA: Diagnosis not present

## 2024-01-30 DIAGNOSIS — I252 Old myocardial infarction: Secondary | ICD-10-CM | POA: Diagnosis not present

## 2024-01-30 DIAGNOSIS — Z7984 Long term (current) use of oral hypoglycemic drugs: Secondary | ICD-10-CM | POA: Diagnosis not present

## 2024-01-30 DIAGNOSIS — M109 Gout, unspecified: Secondary | ICD-10-CM | POA: Diagnosis not present

## 2024-01-30 DIAGNOSIS — Z8673 Personal history of transient ischemic attack (TIA), and cerebral infarction without residual deficits: Secondary | ICD-10-CM | POA: Diagnosis not present

## 2024-01-30 DIAGNOSIS — M4800 Spinal stenosis, site unspecified: Secondary | ICD-10-CM | POA: Diagnosis not present

## 2024-01-30 DIAGNOSIS — J9621 Acute and chronic respiratory failure with hypoxia: Secondary | ICD-10-CM | POA: Diagnosis not present

## 2024-01-30 DIAGNOSIS — Z955 Presence of coronary angioplasty implant and graft: Secondary | ICD-10-CM | POA: Diagnosis not present

## 2024-01-30 DIAGNOSIS — M1990 Unspecified osteoarthritis, unspecified site: Secondary | ICD-10-CM | POA: Diagnosis not present

## 2024-01-30 DIAGNOSIS — I130 Hypertensive heart and chronic kidney disease with heart failure and stage 1 through stage 4 chronic kidney disease, or unspecified chronic kidney disease: Secondary | ICD-10-CM | POA: Diagnosis not present

## 2024-01-30 DIAGNOSIS — I480 Paroxysmal atrial fibrillation: Secondary | ICD-10-CM | POA: Diagnosis not present

## 2024-01-30 DIAGNOSIS — N1831 Chronic kidney disease, stage 3a: Secondary | ICD-10-CM | POA: Diagnosis not present

## 2024-01-30 DIAGNOSIS — E1122 Type 2 diabetes mellitus with diabetic chronic kidney disease: Secondary | ICD-10-CM | POA: Diagnosis not present

## 2024-01-30 DIAGNOSIS — E785 Hyperlipidemia, unspecified: Secondary | ICD-10-CM | POA: Diagnosis not present

## 2024-01-30 DIAGNOSIS — Z7901 Long term (current) use of anticoagulants: Secondary | ICD-10-CM | POA: Diagnosis not present

## 2024-01-30 DIAGNOSIS — I5023 Acute on chronic systolic (congestive) heart failure: Secondary | ICD-10-CM | POA: Diagnosis not present

## 2024-01-30 DIAGNOSIS — D509 Iron deficiency anemia, unspecified: Secondary | ICD-10-CM | POA: Diagnosis not present

## 2024-01-30 DIAGNOSIS — I2510 Atherosclerotic heart disease of native coronary artery without angina pectoris: Secondary | ICD-10-CM | POA: Diagnosis not present

## 2024-01-31 ENCOUNTER — Telehealth (HOSPITAL_COMMUNITY): Payer: Self-pay

## 2024-01-31 NOTE — Telephone Encounter (Signed)
 Called to confirm/remind patient of their appointment at the [facility] on 02/03/24.   Appointment:   [] Confirmed  [x] Left mess   [] No answer/No voice mail  [] VM Full/unable to leave message  [] Phone not in service  And to bring in all medications and/or complete list.

## 2024-02-03 ENCOUNTER — Ambulatory Visit (HOSPITAL_COMMUNITY)
Admission: RE | Admit: 2024-02-03 | Discharge: 2024-02-03 | Disposition: A | Discharge status: Home / Self Care | Source: Ambulatory Visit | Attending: Family Medicine | Admitting: Family Medicine

## 2024-02-03 ENCOUNTER — Encounter (HOSPITAL_COMMUNITY): Payer: Self-pay

## 2024-02-03 VITALS — BP 162/82 | HR 65 | Ht 68.0 in | Wt 182.0 lb

## 2024-02-03 DIAGNOSIS — N183 Chronic kidney disease, stage 3 unspecified: Secondary | ICD-10-CM

## 2024-02-03 DIAGNOSIS — I480 Paroxysmal atrial fibrillation: Secondary | ICD-10-CM | POA: Diagnosis not present

## 2024-02-03 DIAGNOSIS — Z86711 Personal history of pulmonary embolism: Secondary | ICD-10-CM | POA: Diagnosis not present

## 2024-02-03 DIAGNOSIS — I739 Peripheral vascular disease, unspecified: Secondary | ICD-10-CM | POA: Diagnosis not present

## 2024-02-03 DIAGNOSIS — Z8719 Personal history of other diseases of the digestive system: Secondary | ICD-10-CM

## 2024-02-03 DIAGNOSIS — I6522 Occlusion and stenosis of left carotid artery: Secondary | ICD-10-CM | POA: Diagnosis not present

## 2024-02-03 DIAGNOSIS — Z7902 Long term (current) use of antithrombotics/antiplatelets: Secondary | ICD-10-CM | POA: Insufficient documentation

## 2024-02-03 DIAGNOSIS — I5022 Chronic systolic (congestive) heart failure: Secondary | ICD-10-CM | POA: Diagnosis not present

## 2024-02-03 DIAGNOSIS — Z8673 Personal history of transient ischemic attack (TIA), and cerebral infarction without residual deficits: Secondary | ICD-10-CM | POA: Diagnosis not present

## 2024-02-03 DIAGNOSIS — I2510 Atherosclerotic heart disease of native coronary artery without angina pectoris: Secondary | ICD-10-CM | POA: Diagnosis not present

## 2024-02-03 DIAGNOSIS — Z79899 Other long term (current) drug therapy: Secondary | ICD-10-CM | POA: Insufficient documentation

## 2024-02-03 DIAGNOSIS — I350 Nonrheumatic aortic (valve) stenosis: Secondary | ICD-10-CM

## 2024-02-03 DIAGNOSIS — Z955 Presence of coronary angioplasty implant and graft: Secondary | ICD-10-CM | POA: Diagnosis not present

## 2024-02-03 DIAGNOSIS — I4819 Other persistent atrial fibrillation: Secondary | ICD-10-CM | POA: Insufficient documentation

## 2024-02-03 DIAGNOSIS — I130 Hypertensive heart and chronic kidney disease with heart failure and stage 1 through stage 4 chronic kidney disease, or unspecified chronic kidney disease: Secondary | ICD-10-CM | POA: Insufficient documentation

## 2024-02-03 DIAGNOSIS — I6523 Occlusion and stenosis of bilateral carotid arteries: Secondary | ICD-10-CM

## 2024-02-03 LAB — BASIC METABOLIC PANEL WITH GFR
Anion gap: 11 (ref 5–15)
BUN: 15 mg/dL (ref 8–23)
CO2: 27 mmol/L (ref 22–32)
Calcium: 9 mg/dL (ref 8.9–10.3)
Chloride: 105 mmol/L (ref 98–111)
Creatinine, Ser: 1.32 mg/dL — ABNORMAL HIGH (ref 0.61–1.24)
GFR, Estimated: 55 mL/min — ABNORMAL LOW (ref >60)
Glucose, Bld: 131 mg/dL — ABNORMAL HIGH (ref 70–99)
Potassium: 3.7 mmol/L (ref 3.5–5.1)
Sodium: 143 mmol/L (ref 135–145)

## 2024-02-03 LAB — LIPID PANEL
Cholesterol: 143 mg/dL (ref 0–200)
HDL: 53 mg/dL (ref >40)
LDL Cholesterol: 77 mg/dL (ref 0–99)
Total CHOL/HDL Ratio: 2.7 ratio
Triglycerides: 65 mg/dL (ref <150)
VLDL: 13 mg/dL (ref 0–40)

## 2024-02-03 LAB — BRAIN NATRIURETIC PEPTIDE: B Natriuretic Peptide: 426.2 pg/mL — ABNORMAL HIGH (ref 0.0–100.0)

## 2024-02-03 LAB — TSH: TSH: 5.042 u[IU]/mL — ABNORMAL HIGH (ref 0.350–4.500)

## 2024-02-03 MED ORDER — ISOSORB DINITRATE-HYDRALAZINE 20-37.5 MG PO TABS: 1.5000 | ORAL_TABLET | Freq: Two times a day (BID) | ORAL | 11 refills | Status: DC

## 2024-02-03 MED ORDER — DAPAGLIFLOZIN PROPANEDIOL 10 MG PO TABS: 10.0000 mg | ORAL_TABLET | Freq: Every day | ORAL | 11 refills | Status: AC

## 2024-02-03 NOTE — Progress Notes (Signed)
 PCP: Roise Cleaver, MD Cardiology: Dr. Amanda Jungling HF Cardiology: Dr. Mitzie Anda  78 y.o. with history of CVA, persistent atrial fibrillation, and systolic CHF was referred by Dr. Amanda Jungling for evaluation of CHF.  Patient was admitted in 8/21 with CVA. HS-TnI elevated at 2500, echo showed EF 35% with apical akinesis.  This admission was complicated by PE, atrial fibrillation, AKI, anemia.  Cath was not done with anemia and AKI.  However, echo in 10/21 showed EF back up to 60-65% with mild-moderate AS.  It was suspected that the fall in EF was due to a stress cardiomyopathy given improvement. Echo in 7/23 showed EF 60-65%, moderate AS with mean gradient 28 mmHg, AVA 1.2 cm^2 by VTI.    In 11/24, he was admitted to [facility] with anemia and hypotension, melena was present with hgb 7.5.  Creatinine up to 2.39.  He had 2 units PRBCs, and since still hypotensive, he was transferred to [facility]. Echo showed EF 30-35%, diffuse hypokinesis, normal RV, PASP 60 mmHg, mild-moderate MR, moderate AS. He was was in atrial fibrillation during this admission.  Low BP limited GDMT titration.  Amiodarone  was begun for rate control of AF.  EGD was done showing erosive gastritis.  Eliquis  was restarted at discharge.   Patient had LHC/RHC in 12/24, this showed 80% ostial RCA stenosis and 99% proximal LAD stenosis; RHC showed elevated right and left heart filling pressures.  He was admitted for diuresis and to evaluate for CABG vs PCI. He was deemed high risk for CABG, so PCI was done with DES to proximal LAD and ostial RCA. In the hospital, he was converted back into NSR.   He was readmitted in 3/25 with influenza A PNA and also concern for GI bleeding with worsening hemoglobin.  He had severe epistaxis as well.  He was on Plavix  + Eliquis .  Unfortunately, Plavix  was stopped at this time and Eliquis  was continued.  Repeat echo in 3/25 showed EF up to 55-60% with normal RV, moderate aortic stenosis with mean gradient 17 mmHg and AVA 1.12  cm^2.   Follow up 4/25, volume overloaded and off most meds. Plavix  restarted, but kept off Eliquis  as risk of bleeding felt to be too great. Spiro, Lasix , and amio restarted. He was referred to EP for Watchman and AF ablation consideration.  Today he returns for HF follow up. Overall feeling fine. He is not SOB grocery shopping has dyspnea walking to mailbox (>900 feet), Denies palpitations, abnormal bleeding, CP, dizziness, edema, or PND/Orthopnea. Appetite ok. Weight at home 179 pounds. Taking all medications. He denies claudication.   ECG (personally reviewed): none ordered today.  Labs (8/24): LDL 54 Labs (12/24): K 4.4, creatinine 1.7, hgb 10.9, LFTs normal Labs (4/25): K 4, creatinine 1.23, hgb 10.2  PMH: 1. CVA: In 8/21, in setting of atrial fibrillation.  2. Type 2 diabetes 3. HTN 4. Hyperlipidemia 5. PAD: Mesenteric artery bypass in 6/15.  Bilateral iliac artery stents in 8/21 at [facility].  6. Carotid stenosis: Carotid dopplers in 7/23 with 40-59% LICA stenosis.  7. CKD stage 3 8. GI bleeding: 11/24, erosive gastritis.  9. Chronic systolic CHF: Echo in 8/21 in setting of CVA showed EF 35% with apical akinesis.  Repeat echo in 10/21 showed EF up to 60-65%, mild-moderate AS.  Suspect fall in EF in 8/21 was due to stress cardiomyopathy in setting of CVA.  - Echo (7/23): EF 60-65%, moderate AS with mean gradient 28 mmHg, AVA 1.2 cm^2 by VTI.  - Echo (  11/24) in setting of anemia and hypotension showed EF 30-35%, diffuse hypokinesis, normal RV, PASP 60 mmHg, mild-moderate MR, moderate AS.  - PCI to LAD and RCA in 12/24 (see below).  - RHC (12/24): mean RA 12, PA 57/23, mean PCWP 29, CI 2.23, PVR 1.2, PAPi 2.8.  - Echo (3/25): EF up to 55-60% with normal RV, moderate aortic stenosis with mean gradient 17 mmHg and AVA 1.12 cm^2.  10. Aortic stenosis: Moderate on last echo in 3/25.  11. PE: 8/21.  12. Atrial fibrillation: Paroxysmal.  13. CAD: LHC (12/24) with 80% ostial RCA  stenosis, 99% proximal LAD.  Patient was treated with DES to ostial RCA and proximal LAD.  14. Carotid stenosis: Carotid dopplers (12/24) with 40-59% LICA stenosis.   Family History  Problem Relation Age of Onset   Diabetes Mother    Heart disease Mother        before age 24   Hyperlipidemia Mother    Hypertension Mother    AAA (abdominal aortic aneurysm) Mother    Stroke Father    Hypertension Father    Coronary artery disease Father    Hyperlipidemia Father    Diabetes Father    Bladder Cancer Father    Cancer Father    Heart disease Father    Heart attack Father    Colon cancer Neg Hx    Throat cancer Neg Hx    Kidney disease Neg Hx    Liver disease Neg Hx    Social History   Socioeconomic History   Marital status: Widowed    Spouse name: Not on file   Number of children: 1   Years of education: Not on file   Highest education level: High school graduate  Occupational History   Occupation: Retired  Tobacco Use   Smoking status: Former    Current packs/day: 0.00    Types: Cigarettes    Quit date: 07/08/2009    Years since quitting: 14.5    Passive exposure: Past   Smokeless tobacco: Never  Vaping Use   Vaping status: Never Used  Substance and Sexual Activity   Alcohol use: Not Currently   Drug use: Not Currently   Sexual activity: Yes  Other Topics Concern   Not on file  Social History Narrative   Patient is married, he is retired. 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   Social Drivers of Health   Financial Resource Strain: Low Risk  (12/09/2023)   Overall Financial Resource Strain (CARDIA)    Difficulty of Paying Living Expenses: Not hard at all  Food Insecurity: No Food Insecurity (01/16/2024)   Hunger Vital Sign    Worried About Running Out of Food in the Last Year: Never true    Ran Out of Food in the Last Year: Never true  Transportation Needs: No Transportation Needs (01/16/2024)   PRAPARE - Administrator, Civil Service  (Medical): No    Lack of Transportation (Non-Medical): No  Physical Activity: Inactive (12/04/2023)   Exercise Vital Sign    Days of Exercise per Week: 0 days    Minutes of Exercise per Session: 0 min  Stress: No Stress Concern Present (12/09/2023)   [employer] of Occupational Health - Occupational Stress Questionnaire    Feeling of Stress : Not at all  Social Connections: Moderately Isolated (12/14/2023)   Social Connection and Isolation Panel [NHANES]    Frequency of Communication with Friends and Family: More than three times a  week    Frequency of Social Gatherings with Friends and Family: More than three times a week    Attends Religious Services: Never    Database administrator or Organizations: Yes    Attends Banker Meetings: Never    Marital Status: Widowed  Intimate Partner Violence: Not At Risk (01/16/2024)   Humiliation, Afraid, Rape, and Kick questionnaire    Fear of Current or Ex-Partner: No    Emotionally Abused: No    Physically Abused: No    Sexually Abused: No   ROS: All systems reviewed and negative except as per HPI.   Current Outpatient Medications  Medication Sig Dispense Refill   Accu-Chek Softclix Lancets lancets SMARTSIG:Topical 1-3 Times Daily     allopurinol  (ZYLOPRIM ) 100 MG tablet Take 100 mg by mouth 2 (two) times daily.     amiodarone  (PACERONE ) 200 MG tablet Take 1 tablet (200 mg total) by mouth daily. 90 tablet 3   atorvastatin  (LIPITOR ) 80 MG tablet Take 1 tablet (80 mg total) by mouth daily. 90 tablet 3   Blood Glucose Monitoring Suppl (ACCU-CHEK GUIDE) w/Device KIT CHECK BLOOD SUGAR UP TO THREE TIMES DAILY AS DIRECTED     Blood Glucose Monitoring Suppl DEVI 1 each by Does not apply route in the morning, at noon, and at bedtime. May substitute to any manufacturer covered by patient's insurance. 1 each 0   Calcium  Carb-Cholecalciferol (CALCIUM  500 + D PO) Take 1 tablet by mouth daily.     carbidopa -levodopa  (SINEMET ) 10-100 MG  tablet Take 1 tablet by mouth 3 (three) times daily. For parkinsonism 90 tablet 5   carvedilol  (COREG ) 6.25 MG tablet Take 1 tablet (6.25 mg total) by mouth 2 (two) times daily. 180 tablet 3   clopidogrel  (PLAVIX ) 75 MG tablet Take 1 tablet (75 mg total) by mouth daily. 90 tablet 3   ferrous sulfate  325 (65 FE) MG tablet Take 1 tablet (325 mg total) by mouth daily.     furosemide  (LASIX ) 20 MG tablet Take 1 tablet (20 mg total) by mouth daily. 90 tablet 3   isosorbide -hydrALAZINE  (BIDIL ) 20-37.5 MG tablet TAKE 1 TABLET 2 TIMES A DAY 60 tablet 0   pantoprazole  (PROTONIX ) 40 MG tablet TAKE ONE TABLET TWICE DAILY 60 tablet 2   spironolactone  (ALDACTONE ) 25 MG tablet Take 0.5 tablets (12.5 mg total) by mouth daily. 45 tablet 3   No current facility-administered medications for this encounter.   BP (!) 162/82   Pulse 65   Ht 5\' 8"  (1.727 m)   Wt 82.6 kg (182 lb)   SpO2 97%   BMI 27.67 kg/m  Physical Exam General:  NAD. No resp difficulty, walked into clinic HEENT: Normal Neck: Supple. No JVD. Cor: Regular rate & rhythm. No rubs, gallops, 2/6 SEM clear S2 Lungs: Clear Abdomen: Soft, obese, nontender, nondistended.  Extremities: No cyanosis, clubbing, rash, 1-2+ pre-tibial BLE edema Neuro: Alert & oriented x 3, moves all 4 extremities w/o difficulty. Affect pleasant.  Assessment/Plan: 1. Chronic systolic => diastolic CHF: Drop in EF in 8/21 to 35% with apical akinesis in setting CVA with improvement to normal EF by 10/21.  This was thought to be due to stress cardiomyopathy.  Echo in 11/24 showed EF back down to EF 30-35% with diffuse hypokinesis, normal RV, PASP 60 mmHg, mild-moderate MR, moderate AS. LHC in 12/24 showed severe ostial RCA/proximal LAD stenosis.  This was treated with DES to RCA and LAD.  Repeat echo in 3/25 showed EF up to  55-60% with normal RV, moderate aortic stenosis with mean gradient 17 mmHg and AVA 1.12 cm^2.  Stable NYHA class II symptoms. He is mildly volume overloaded  on exam - Restart Farxiga  10 mg daily. - Increase BiDil  to 1.5 tab tid. - Continue Lasix  20 mg daily. BMET/BNP today  - Continue spironolactone  12.5 daily with diastolic CHF and elevated BP.  - Hold off on ARB/ARBNi until renal function settles out. - Advised he wear his compression hose. 2. Atrial fibrillation: Persistent.  He is back on Eliquis  but has had recurrent GI bleeding from erosive gastritis. He was started on amiodarone  at 11/24 admission. He is in NSR today.  No current overt bleeding.  Ideally, he would be on both Eliquis  + Plavix  given AF and stents to proximal RCA and LAD in 12/24.  Unfortunately, Plavix  previously stopped. He was very reluctant to restart Plavix  given epistaxis and GI bleeding. Dr. Mitzie Anda discussed with patient and he is now back on Plavix .  - Suspect that he is not going to be able to take both Plavix  and Eliquis  due to bleeding issues.  Given the recent stents (12/24), it is imperative for him to be on Plavix  75 mg daily. To accomplish this, he will remain OFF Eliquis  for now.    - Continue amiodarone  200 mg daily. Recent LFTs normal, check TSH today.  He will need a regular eye exam.  - He has been referred to EP to consider both Watchman and AF ablation consideration. 3. CVA: 8/21, suspect due to atrial fibrillation.   - Unfortunately, think we are going to need to make the trade-off between Eliquis  and Plavix  for the time being. Dr. Mitzie Anda had a long discussion with him about this last visit; do not think he is going to be able to take both due to bleeding (stopped Plavix  soon after stents and continues on Eliquis ).  Therefore, for the time being, with relatively new stents to proximal RCA and LAD, he will take Plavix  75 mg daily and stay off Eliquis .  After 6 mos-1 year (~06/2024-12/2024, will stop Plavix  and start back on Eliquis ). Ultimately, would like Watchman.  4. PE: In 8/21.   - As above, holding Eliquis  for now.  5. Aortic stenosis: AS looked moderate on  3/25 echo. Clear S2. - Follow over time.  6. PAD: H/o iliac stents and mesenteric artery bypass.  No claudication or intestinal angina. No pedal ulcerations.  - Continue statin, check lipids today. 7. Carotid stenosis: 40-59% LICA stenosis on dopplers in 12/24, repeat 12/25.  8. GI bleeding: 11/24 GI bleeding from erosive esophagitis. Recurrent GI bleeding in 3/25, no significant findings on EGD.  No further bleeding, recent CBC stable. - Avoid ETOH.  - Continue PPI.  - See discussion above regarding Plavix  and Eliquis .   9. CKD stage 3: Follow BMET carefully.   - Restarting SGLT2i as above 10. HTN: BP has been quite high recently.  He is only taking Bidil  1 tab bid.  - Increase BiDil  to 1.5 tab tid. Notify clinic if sBP > 140 - Continue Coreg  6.25 mg bid.  - Continue spironolactone  12.5 mg daily.  - Eventually, would replace Bidil  with ARB or Entresto , depending on trend in renal function.  11. CAD: 12/24 cath with 80% pRCA and 99% pLAD, treated with DES x 2.  Suspect ischemic cardiomyopathy given improvement on 3/25 echo.  Unfortunately, Plavix  was stopped not long after PCI, I did not realize that it was stopped.  He had severe  epistaxis as well as suspected GI bleeding with recurrent anemia on combination of Plavix  + Eliquis .   - Difficult choice in this situation, but given relatively recent stents, would rather have him on Plavix  75 mg daily.  Do not think that he is going to be able to tolerate the combination of Plavix  and Eliquis  (and he refuses to restart Plavix  unless Eliquis  is stopped). For now, he will remain off Eliquis  and continue Plavix .  6-12 months post-PCI, will revisit transition back from Plavix  to Eliquis  alone.  As above, ultimately think he needs to get a Watchman device. No chest pain. - Continue atorvastatin , check lipids.  Follow up in 4 months with Dr. Mitzie Anda.   [facility] FNP-BC 02/03/2024

## 2024-02-03 NOTE — Patient Instructions (Addendum)
 Thank you for coming in today  If you had labs drawn today, any labs that are abnormal the clinic will call you No news is good news  Please wear your compression hose daily, place them on as soon as you get up in the morning and remove before you go to bed at night.   Medications: RESTART Farxiga  10 mg 1 tablet daily  Increase Bidil  to 1 1/2 tablets 3 times daily Check blood pressure daily and notify clinic if your systolic/top number is greater than 130  Follow up appointments:  Your physician recommends that you schedule a follow-up appointment in:  4 months With Dr. Mitzie Anda  Please call our office to schedule the follow-up appointment in July for September 2025.    Do the following things EVERYDAY: Weigh yourself in the morning before breakfast. Write it down and keep it in a log. Take your medicines as prescribed Eat low salt foods--Limit salt (sodium) to 2000 mg per day.  Stay as active as you can everyday Limit all fluids for the day to less than 2 liters   At the [facility], you and your health needs are our priority. As part of our continuing mission to provide you with exceptional heart care, we have created designated Provider Care Teams. These Care Teams include your primary Cardiologist (physician) and Advanced Practice Providers (APPs- Physician Assistants and Nurse Practitioners) who all work together to provide you with the care you need, when you need it.   You may see any of the following providers on your designated Care Team at your next follow up: Dr Jules Oar Dr Peder Bourdon Dr. Mimi Alt, NP Ruddy Corral, [place] [facility] [place], [place] Dennise Fitz, NP Luster Salters, PharmD   Please be sure to bring in all your medications bottles to every appointment.    Thank you for choosing [facility]  If you have any questions or concerns before your next appointment  please send us  a message through [place] or call our office at [phone].    TO LEAVE A MESSAGE FOR THE NURSE SELECT OPTION 2, PLEASE LEAVE A MESSAGE INCLUDING: YOUR NAME DATE OF BIRTH CALL BACK NUMBER REASON FOR CALL**this is important as we prioritize the call backs  YOU WILL RECEIVE A CALL BACK THE SAME DAY AS LONG AS YOU CALL BEFORE 4:00 PM

## 2024-02-04 ENCOUNTER — Encounter: Payer: Self-pay | Admitting: Cardiology

## 2024-02-04 ENCOUNTER — Ambulatory Visit: Attending: Cardiology | Admitting: Cardiology

## 2024-02-04 ENCOUNTER — Other Ambulatory Visit (HOSPITAL_COMMUNITY): Payer: Self-pay | Admitting: Cardiology

## 2024-02-04 ENCOUNTER — Ambulatory Visit (HOSPITAL_COMMUNITY): Payer: Self-pay | Admitting: Family Medicine

## 2024-02-04 VITALS — BP 199/88 | HR 62 | Ht 68.0 in | Wt 181.8 lb

## 2024-02-04 DIAGNOSIS — I480 Paroxysmal atrial fibrillation: Secondary | ICD-10-CM

## 2024-02-04 DIAGNOSIS — I5022 Chronic systolic (congestive) heart failure: Secondary | ICD-10-CM | POA: Diagnosis not present

## 2024-02-04 DIAGNOSIS — I2510 Atherosclerotic heart disease of native coronary artery without angina pectoris: Secondary | ICD-10-CM | POA: Diagnosis not present

## 2024-02-04 DIAGNOSIS — D6869 Other thrombophilia: Secondary | ICD-10-CM

## 2024-02-04 DIAGNOSIS — I4819 Other persistent atrial fibrillation: Secondary | ICD-10-CM | POA: Diagnosis not present

## 2024-02-04 MED ORDER — ISOSORB DINITRATE-HYDRALAZINE 20-37.5 MG PO TABS: 1.5000 | ORAL_TABLET | Freq: Three times a day (TID) | ORAL | 11 refills | Status: DC

## 2024-02-04 NOTE — Progress Notes (Signed)
  Electrophysiology Office Note:   Date:  02/04/2024  ID:  Connor Bryant, DOB January 06, 1946, MRN [id number]  Primary Cardiologist: Armida Lander, MD Electrophysiologist: Ardeen Kohler, MD  {Click to update primary MD,subspecialty MD or APP then REFRESH:1}    History of Present Illness:   Connor Bryant is a 78 y.o. male with h/o *** seen today for ***  Discussed the use of AI scribe software for clinical note transcription with the patient, who gave verbal consent to proceed.  History of Present Illness     Review of systems complete and found to be negative unless listed in HPI.   EP Information / Studies Reviewed:    {EKGtoday:28818}      ***  Risk Assessment/Calculations:   {Does this patient have ATRIAL FIBRILLATION?:[phone]} HYPERTENSION CONTROL Vitals:   02/04/24 1520 02/04/24 1528  BP: (!) 188/88 (!) 199/88    The patient's blood pressure is elevated above target today. {Click here to indicate intervention taken to address high BP   Refresh Note :1} *** In order to address the patient's elevated BP:              Physical Exam:   VS:  BP (!) 199/88 (BP Location: Right Arm, Cuff Size: Normal)   Pulse 62   Ht 5\' 8"  (1.727 m)   Wt 181 lb 12.8 oz (82.5 kg)   SpO2 97%   BMI 27.64 kg/m    Wt Readings from Last 3 Encounters:  02/04/24 181 lb 12.8 oz (82.5 kg)  02/03/24 182 lb (82.6 kg)  01/16/24 179 lb (81.2 kg)     GEN: Well nourished, well developed in no acute distress NECK: No JVD; No carotid bruits CARDIAC: {EPRHYTHM:28826}, no murmurs, rubs, gallops RESPIRATORY:  Clear to auscultation without rales, wheezing or rhonchi  ABDOMEN: Soft, non-tender, non-distended EXTREMITIES:  No edema; No deformity   ASSESSMENT AND PLAN:   Assessment and Plan Assessment & Plan       Follow up with {WUJWJ:19147} {EPFOLLOW WG:95621}  Signed, Ardeen Kohler, MD

## 2024-02-04 NOTE — Patient Instructions (Addendum)
 Medication Instructions:  Your physician recommends that you continue on your current medications as directed. Please refer to the Current Medication list given to you today.  *If you need a refill on your cardiac medications before your next appointment, please call your pharmacy*  Lab Work: BMET and CBC - you may go to any [facility] location to have these drawn within 30 days of your procedure  Testing/Procedures: Cardiac CT Your physician has requested that you have cardiac CT. Cardiac computed tomography (CT) is a painless test that uses an x-ray machine to take clear, detailed pictures of your heart. For further information please visit [email or web address] Please follow instruction sheet as given. We will call you to schedule your CT scan. It will be done about three weeks prior to your ablation.  Ablation Your physician has recommended that you have an ablation. Catheter ablation is a medical procedure used to treat some cardiac arrhythmias (irregular heartbeats). During catheter ablation, a long, thin, flexible tube is put into a blood vessel in your groin (upper thigh), or neck. This tube is called an ablation catheter. It is then guided to your heart through the blood vessel. Radio frequency waves destroy small areas of heart tissue where abnormal heartbeats may cause an arrhythmia to start.   Watchman  Your physician has requested that you have Left atrial appendage (LAA) closure device implantation is a procedure to put a small device in the LAA of the heart. The LAA is a small sac in the wall of the heart's left upper chamber. Blood clots can form in this area. The device, Watchman closes the LAA to help prevent a blood clot and stroke.   Follow-Up: At [facility], you and your health needs are our priority.  As part of our continuing mission to provide you with exceptional heart care, we have created designated Provider Care Teams.  These Care Teams include your primary  Cardiologist (physician) and Advanced Practice Providers (APPs -  Physician Assistants and Nurse Practitioners) who all work together to provide you with the care you need, when you need it.   Your next appointment:   We will contact you about your post-procedure follow up appointments.  You will be contacted by Nurse Navigator, Larkin Plumb to schedule your pre-procedure visit and procedure date. If you have any questions she can be reached at [phone].

## 2024-02-05 LAB — HM DIABETES EYE EXAM

## 2024-02-05 LAB — T4, FREE: Free T4: 0.9 ng/dL (ref 0.61–1.12)

## 2024-02-05 NOTE — Telephone Encounter (Signed)
 Called and spoke with lab here at [facility]- lab rep states that if order placed now she can add on free T4. Order placed.   Attempted to call patient to let him know but unable to reach. Left message to call back.

## 2024-02-05 NOTE — Telephone Encounter (Signed)
-----   Message from Elmarie Hacking sent at 02/04/2024  9:53 AM EDT ----- Labs stable, TSH mildly elevated. Please add free T4

## 2024-02-06 DIAGNOSIS — Z955 Presence of coronary angioplasty implant and graft: Secondary | ICD-10-CM | POA: Diagnosis not present

## 2024-02-06 DIAGNOSIS — M1990 Unspecified osteoarthritis, unspecified site: Secondary | ICD-10-CM | POA: Diagnosis not present

## 2024-02-06 DIAGNOSIS — E1122 Type 2 diabetes mellitus with diabetic chronic kidney disease: Secondary | ICD-10-CM | POA: Diagnosis not present

## 2024-02-06 DIAGNOSIS — I5023 Acute on chronic systolic (congestive) heart failure: Secondary | ICD-10-CM | POA: Diagnosis not present

## 2024-02-06 DIAGNOSIS — M0579 Rheumatoid arthritis with rheumatoid factor of multiple sites without organ or systems involvement: Secondary | ICD-10-CM | POA: Diagnosis not present

## 2024-02-06 DIAGNOSIS — Z8673 Personal history of transient ischemic attack (TIA), and cerebral infarction without residual deficits: Secondary | ICD-10-CM | POA: Diagnosis not present

## 2024-02-06 DIAGNOSIS — I480 Paroxysmal atrial fibrillation: Secondary | ICD-10-CM | POA: Diagnosis not present

## 2024-02-06 DIAGNOSIS — Z7984 Long term (current) use of oral hypoglycemic drugs: Secondary | ICD-10-CM | POA: Diagnosis not present

## 2024-02-06 DIAGNOSIS — M4800 Spinal stenosis, site unspecified: Secondary | ICD-10-CM | POA: Diagnosis not present

## 2024-02-06 DIAGNOSIS — I2510 Atherosclerotic heart disease of native coronary artery without angina pectoris: Secondary | ICD-10-CM | POA: Diagnosis not present

## 2024-02-06 DIAGNOSIS — D509 Iron deficiency anemia, unspecified: Secondary | ICD-10-CM | POA: Diagnosis not present

## 2024-02-06 DIAGNOSIS — M109 Gout, unspecified: Secondary | ICD-10-CM | POA: Diagnosis not present

## 2024-02-06 DIAGNOSIS — N1831 Chronic kidney disease, stage 3a: Secondary | ICD-10-CM | POA: Diagnosis not present

## 2024-02-06 DIAGNOSIS — E785 Hyperlipidemia, unspecified: Secondary | ICD-10-CM | POA: Diagnosis not present

## 2024-02-06 DIAGNOSIS — I252 Old myocardial infarction: Secondary | ICD-10-CM | POA: Diagnosis not present

## 2024-02-06 DIAGNOSIS — I130 Hypertensive heart and chronic kidney disease with heart failure and stage 1 through stage 4 chronic kidney disease, or unspecified chronic kidney disease: Secondary | ICD-10-CM | POA: Diagnosis not present

## 2024-02-06 DIAGNOSIS — Z7901 Long term (current) use of anticoagulants: Secondary | ICD-10-CM | POA: Diagnosis not present

## 2024-02-06 DIAGNOSIS — J9621 Acute and chronic respiratory failure with hypoxia: Secondary | ICD-10-CM | POA: Diagnosis not present

## 2024-02-11 ENCOUNTER — Encounter: Payer: Self-pay | Admitting: Family Medicine

## 2024-02-11 ENCOUNTER — Ambulatory Visit: Admitting: Family Medicine

## 2024-02-11 VITALS — BP 169/62 | HR 57 | Temp 98.5°F | Ht 68.0 in | Wt 183.0 lb

## 2024-02-11 DIAGNOSIS — L089 Local infection of the skin and subcutaneous tissue, unspecified: Secondary | ICD-10-CM

## 2024-02-11 DIAGNOSIS — S81801A Unspecified open wound, right lower leg, initial encounter: Secondary | ICD-10-CM

## 2024-02-11 MED ORDER — SULFAMETHOXAZOLE-TRIMETHOPRIM 800-160 MG PO TABS: 1.0000 | ORAL_TABLET | Freq: Two times a day (BID) | ORAL | 0 refills | Status: DC

## 2024-02-11 NOTE — Progress Notes (Signed)
 Subjective:  Patient ID: Connor Bryant, male    DOB: 01-16-1946  Age: 78 y.o. MRN: [id number]  CC: Leg Injury (Knot on leg that is painful. Possible hematoma. Two pustula came up as well. )   HPI [name] presents for a painful knot on the right lower extremity over the mid shin.  This was caused by hitting the leg with a blower as he pulled on the ignition cord.  This occurred 1-1/2 weeks ago.  It seemed to get better at first but now has gotten worse.  He notes the whole leg is swollen.     01/16/2024    2:38 PM 12/24/2023    2:57 PM 12/04/2023    2:18 PM  Depression screen PHQ 2/9  Decreased Interest 0 0 1  Down, Depressed, Hopeless 0 0 0  PHQ - 2 Score 0 0 1  Altered sleeping  0 1  Tired, decreased energy  0 1  Change in appetite  0 1  Feeling bad or failure about yourself   0 1  Trouble concentrating  0 1  Moving slowly or fidgety/restless  0 1  Suicidal thoughts  0 0  PHQ-9 Score  0 7  Difficult doing work/chores  Not difficult at all Not difficult at all    History Demon has a past medical history of Acute CVA (cerebrovascular accident) (HCC) (05/01/2020), Acute respiratory failure with hypoxia (HCC) (05/04/2020), Arthritis, Atrial fibrillation (HCC), DM type 2 (diabetes mellitus, type 2) (HCC), Endotracheally intubated, Gallstone, Heart murmur, HTN (hypertension), Hyperlipidemia, Myocardial infarct, old, Personal history of colonic polyp-adenoma (11/24/2013), Renal insufficiency, and Thoracic spine fracture (HCC).   He has a past surgical history that includes Debridement tennis elbow (Right); Tonsillectomy and adenoidectomy; Colonoscopy; Esophagogastroduodenoscopy; Mesenteric artery bypass (N/A, 03/10/2014); abdominal aortagram (N/A, 02/23/2014); vascualr surgery; Cataract extraction, bilateral; Esophagogastroduodenoscopy (egd) with propofol  (N/A, 08/20/2023); biopsy (08/20/2023); RIGHT/LEFT HEART CATH AND CORONARY ANGIOGRAPHY (N/A, 09/11/2023); CORONARY STENT  INTERVENTION (N/A, 09/16/2023); CORONARY ATHERECTOMY (N/A, 09/16/2023); Coronary Ultrasound/IVUS (N/A, 09/16/2023); CORONARY LITHOTRIPSY (N/A, 09/16/2023); and Esophagogastroduodenoscopy (N/A, 11/30/2023).   His family history includes AAA (abdominal aortic aneurysm) in his mother; Bladder Cancer in his father; Cancer in his father; Coronary artery disease in his father; Diabetes in his father and mother; Heart attack in his father; Heart disease in his father and mother; Hyperlipidemia in his father and mother; Hypertension in his father and mother; Stroke in his father.He reports that he quit smoking about 14 years ago. His smoking use included cigarettes. He has been exposed to tobacco smoke. He has never used smokeless tobacco. He reports that he does not currently use alcohol. He reports that he does not currently use drugs.    ROS Review of Systems  Constitutional:  Negative for activity change and fever.    Objective:  BP (!) 169/62   Pulse (!) 57   Temp 98.5 F (36.9 C)   Ht 5\' 8"  (1.727 m)   Wt 183 lb (83 kg)   SpO2 97%   BMI 27.83 kg/m   BP Readings from Last 3 Encounters:  02/11/24 (!) 169/62  02/04/24 (!) 199/88  02/03/24 (!) 162/82    Wt Readings from Last 3 Encounters:  02/11/24 183 lb (83 kg)  02/04/24 181 lb 12.8 oz (82.5 kg)  02/03/24 182 lb (82.6 kg)     Physical Exam Musculoskeletal:        General: Swelling, tenderness and signs of injury (There is a 5 cm raised erythematous lesion over the medial  shin at midshaft of the tibia that is tense but not fluctuant.  There is associated erythema) present.     Right lower leg: Edema present.      Assessment & Plan:  Wound of right lower extremity, initial encounter -     Ambulatory referral to Orthopedics  Wound infection, posttraumatic -     Ambulatory referral to Orthopedics  Other orders -     Sulfamethoxazole-Trimethoprim; Take 1 tablet by mouth 2 (two) times daily. Until gone, for infection  Dispense:  20 tablet; Refill: 0   This wound may be extensive enough to be debridement hide like for him to see orthopedics and he will walk-in at the acute care orthopedic clinic at [facility] today.  Follow-up: No follow-ups on file.  Roise Cleaver, M.D.

## 2024-02-13 ENCOUNTER — Encounter (HOSPITAL_COMMUNITY): Payer: Self-pay | Admitting: Gastroenterology

## 2024-02-13 ENCOUNTER — Ambulatory Visit: Admitting: Orthopedic Surgery

## 2024-02-13 ENCOUNTER — Other Ambulatory Visit: Payer: Self-pay | Admitting: *Deleted

## 2024-02-13 DIAGNOSIS — S8011XA Contusion of right lower leg, initial encounter: Secondary | ICD-10-CM

## 2024-02-13 NOTE — Patient Instructions (Signed)
 Visit Information  Thank you for taking time to visit with me today. Please don't hesitate to contact me if I can be of assistance to you before our next scheduled appointment.  Your next care management appointment is by telephone on 03-12-2024 at 2:00 pm  Telephone follow-up in 1 month  Please call the care guide team at [phone] if you need to cancel, schedule, or reschedule an appointment.   Please call the Suicide and Crisis Lifeline: 988 call the [place]  National Suicide Prevention Lifeline: 346-721-6056 or TTY: 336-687-2561 TTY (365)703-7067) to talk to a trained counselor call 1-800-273-TALK (toll free, 24 hour hotline) call the [facility]: [phone]) [phone] call 911 if you are experiencing a Mental Health or Behavioral Health Crisis or need someone to talk to.  Grandville Lax, BSN RN [place]  [facility], [facility] RN Care Manager Direct Dial: [phone]  Fax: [phone]  Heart Failure Action Plan A heart failure action plan helps you know what to do when you have symptoms of heart failure. Your action plan is a color-coded plan that lists the symptoms to watch for and indicates what actions to take. If you have symptoms in the green zone, you're doing well. If you have symptoms in the yellow zone, you're having problems. If you have symptoms in the red zone, you need medical care right away. Follow the plan that was created by you and your health care provider. Review your plan each time you visit your provider. Green zone These signs mean you're doing well and can continue what you're doing: You don't have new or worsening shortness of breath. You have very little swelling or no new swelling. Your weight is stable (no gain or loss). You have a normal activity level. You don't have chest pain or any other new symptoms. Yellow zone These signs and symptoms mean your condition may be getting worse and you should make some  changes: You have trouble breathing when you're active. You have swelling in your feet or legs or have discomfort in your belly. You gain 2-3 lb (0.9-1.4 kg) in 24 hours, or 5 lb (2.3 kg) in a week. This amount may be more or less depending on your condition. You get tired easily. You have trouble sleeping. You have a dry cough. If you have any of these symptoms: Contact your provider within the next day. Your provider may adjust your medicines. Red zone These signs and symptoms mean you should get medical help right away: You have trouble breathing when resting or cannot lie flat and you need to raise your head to help you breathe. You have a dry cough that's getting worse. You have swelling or pain in your feet or legs or discomfort in your belly that's getting worse. You suddenly gain more than 2-3 lb (0.9-1.4 kg) in 24 hours, or more than 5 lb (2.3 kg) in a week. This amount may be more or less depending on your condition. You have trouble staying awake or you feel confused. You don't have an appetite. You have worsening sadness or depression. These symptoms may be an emergency. Call 911 right away. Do not wait to see if the symptoms will go away. Do not drive yourself to the hospital. Follow these instructions at home: Take medicines only as told. Eat a heart-healthy diet. Work with a dietitian to create an eating plan that's best for you. Weigh yourself each day. Your target weight is __________ lb (__________ kg). Call your provider if you  gain more than __________ lb (__________ kg) in 24 hours, or more than __________ lb (__________ kg) in a week. Health care provider name: _____________________________________________________ Health care provider phone number: _____________________________________________________ Where to find more information American Heart Association: heart.org This information is not intended to replace advice given to you by your health care provider.  Make sure you discuss any questions you have with your health care provider. Document Revised: 04/25/2023 Document Reviewed: 04/25/2023 Elsevier Patient Education  2024 [employer].

## 2024-02-13 NOTE — Patient Outreach (Signed)
 Complex Care Management   Visit Note  02/13/2024  Name:  Connor Bryant MRN: [id number] DOB: [birth year]  Situation: Referral received for Complex Care Management related to Heart Failure and Atrial Fibrillation I obtained verbal consent from Patient.  Visit completed with patient  on the phone  Background:   Past Medical History:  Diagnosis Date   Acute CVA (cerebrovascular accident) (HCC) 05/01/2020   Acute respiratory failure with hypoxia (HCC) 05/04/2020   Arthritis    Atrial fibrillation (HCC)    DM type 2 (diabetes mellitus, type 2) (HCC)    no meds   Endotracheally intubated    Gallstone    Heart murmur    was told one time he had a murmur, not since 30 years ago   HTN (hypertension)    Hyperlipidemia    Myocardial infarct, old    2021   Personal history of colonic polyp-adenoma 11/24/2013   Renal insufficiency    mild   Thoracic spine fracture (HCC)     Assessment: Patient Reported Symptoms:  Cognitive Cognitive Status: Alert and oriented to person, place, and time, Normal speech and language skills, Insightful and able to interpret abstract concepts Cognitive/Intellectual Conditions Management [RPT]: None reported or documented in medical history or problem list   Health Maintenance Behaviors: Annual physical exam  Neurological Neurological Review of Symptoms: No symptoms reported    HEENT HEENT Symptoms Reported: No symptoms reported      Cardiovascular Cardiovascular Symptoms Reported: No symptoms reported Does patient have uncontrolled Hypertension?: Yes Is patient checking Blood Pressure at home?: No Cardiovascular Conditions: Heart failure, Hypertension, High blood cholesterol, Dysrhythmia Cardiovascular Management Strategies: Medication therapy, Routine screening Weight: 179 lb (81.2 kg)  Respiratory Respiratory Symptoms Reported: No symptoms reported    Endocrine Patient reports the following symptoms related to hypoglycemia or hyperglycemia : No  symptoms reported Is patient diabetic?: Yes Is patient checking blood sugars at home?: No Endocrine Conditions: Diabetes Endocrine Management Strategies: Diet modification, Routine screening  Gastrointestinal Gastrointestinal Symptoms Reported: No symptoms reported   Nutrition Risk Screen (CP): No indicators present  Genitourinary Genitourinary Symptoms Reported: No symptoms reported    Integumentary Integumentary Symptoms Reported: Wound Additional Integumentary Details: Right leg blister Skin Conditions: Wound Skin Management Strategies: Dressing changes  Musculoskeletal Musculoskelatal Symptoms Reviewed: No symptoms reported   Falls in the past year?: Yes Number of falls in past year: 1 or less Was there an injury with Fall?: No Fall Risk Category Calculator: 1 Patient Fall Risk Level: Low Fall Risk Patient at Risk for Falls Due to: History of fall(s) Fall risk Follow up: Falls evaluation completed, Education provided, Falls prevention discussed  Psychosocial Psychosocial Symptoms Reported: No symptoms reported     Quality of Family Relationships: helpful, involved, supportive Do you feel physically threatened by others?: No      02/13/2024    2:30 PM  Depression screen PHQ 2/9  Decreased Interest 0  Down, Depressed, Hopeless 0  PHQ - 2 Score 0    There were no vitals filed for this visit.  Medications Reviewed Today     Reviewed by Remona Carmel, RN (Registered Nurse) on 02/13/24 at 1413  Med List Status: <None>   Medication Order Taking? Sig Documenting Provider Last Dose Status Informant  Accu-Chek Softclix Lancets lancets [id number] Yes SMARTSIG:Topical 1-3 Times Daily [provider] Taking Active   allopurinol  (ZYLOPRIM ) 100 MG tablet [id number] Yes Take 100 mg by mouth 2 (two) times daily. [provider] Taking Active   amiodarone  (  PACERONE ) 200 MG tablet [id number] Yes Take 1 tablet (200 mg total) by mouth daily. Darlis Eisenmenger, MD Taking  Active   atorvastatin  (LIPITOR ) 80 MG tablet [id number] Yes Take 1 tablet (80 mg total) by mouth daily. Roise Cleaver, MD Taking Active            Med Note Author Board, Aneta Keepers   Sat Dec 14, 2023  2:58 PM)    Blood Glucose Monitoring Suppl (ACCU-CHEK GUIDE) w/Device Suzanne Erps [id number] Yes CHECK BLOOD SUGAR UP TO THREE TIMES DAILY AS DIRECTED [provider] Taking Active   Blood Glucose Monitoring Suppl DEVI [id number] Yes 1 each by Does not apply route in the morning, at noon, and at bedtime. May substitute to any manufacturer covered by patient's insurance. Roise Cleaver, MD Taking Active Self  Calcium  Carb-Cholecalciferol (CALCIUM  500 + D PO) [id number] Yes Take 1 tablet by mouth daily. [provider] Taking Active Self  carbidopa -levodopa  (SINEMET ) 10-100 MG tablet [id number] Yes Take 1 tablet by mouth 3 (three) times daily. For parkinsonism Roise Cleaver, MD Taking Active   carvedilol  (COREG ) 6.25 MG tablet [id number] Yes Take 1 tablet (6.25 mg total) by mouth 2 (two) times daily. Darlis Eisenmenger, MD Taking Active   clopidogrel  (PLAVIX ) 75 MG tablet [id number] Yes Take 1 tablet (75 mg total) by mouth daily. Darlis Eisenmenger, MD Taking Active   dapagliflozin  propanediol (FARXIGA ) 10 MG TABS tablet [id number] Yes Take 1 tablet (10 mg total) by mouth daily. Elmarie Hacking, FNP Taking Active   ferrous sulfate  325 (65 FE) MG tablet [id number] Yes Take 1 tablet (325 mg total) by mouth daily. Lee, [place], NP Taking Active   isosorbide -hydrALAZINE  (BIDIL ) 20-37.5 MG tablet [id number] Yes Take 1.5 tablets by mouth 3 (three) times daily. Williams, Arlice Bene, FNP Taking Active   pantoprazole  (PROTONIX ) 40 MG tablet [id number] Yes TAKE ONE TABLET TWICE DAILY Roise Cleaver, MD Taking Active   spironolactone  (ALDACTONE ) 25 MG tablet [id number] Yes Take 0.5 tablets (12.5 mg total) by mouth daily. Darlis Eisenmenger, MD Taking Active   sulfamethoxazole-trimethoprim (BACTRIM DS) 800-160 MG tablet [id number]  Yes Take 1 tablet by mouth 2 (two) times daily. Until gone, for infection Roise Cleaver, MD Taking Active             Recommendation:   PCP Follow-up  Follow Up Plan:   Telephone follow up appointment date/time:  03-12-2024 at 2:00 pm  Grandville Lax, BSN RN [facility], [facility] RN Care Manager Direct Dial: [phone]  Fax: [phone]

## 2024-02-17 ENCOUNTER — Encounter: Payer: Self-pay | Admitting: Orthopedic Surgery

## 2024-02-17 NOTE — Progress Notes (Signed)
 Office Visit Note   Patient: Connor Bryant           Date of Birth: [birth year]           MRN: [id number] Visit Date: 02/13/2024              Requested by: Roise Cleaver, MD [address] [place],  [place] [address] PCP: Roise Cleaver, MD  Chief Complaint  Patient presents with   Right Leg - Injury      HPI: Patient is a 78 year old gentleman who is seen for initial evaluation for hematoma right leg.  Patient states he struck his leg on a blower while pulling the ignition court about 2 weeks ago.  Patient has been on Bactrim DS.  Patient has a past medical history positive for diabetes and is on Plavix  for a cardiac stent.  Assessment & Plan: Visit Diagnoses:  1. Hematoma of right lower leg     Plan: Recommended elevation and a compression sock to help decrease the swelling.  Follow-Up Instructions: Return in about 2 weeks (around 02/27/2024).   Ortho Exam  Patient is alert, oriented, no adenopathy, well-dressed, normal affect, normal respiratory effort. Examination patient has a good dorsalis pedis pulse.  He does have a hematoma medial calf on the right that is 5 cm in diameter.  Patient states that it does seem smaller.  There is no cellulitis no drainage no ischemic skin changes.  Imaging: No results found. No images are attached to the encounter.  Labs: Lab Results  Component Value Date   HGBA1C 5.9 (H) 12/24/2023   HGBA1C 6.6 (H) 08/20/2023   HGBA1C 6.8 (H) 08/17/2023   ESRSEDRATE 43 (H) 04/15/2023   ESRSEDRATE 96 (H) 05/05/2020   ESRSEDRATE 50 (H) 12/18/2013   REPTSTATUS 05/11/2020 FINAL 05/05/2020   GRAMSTAIN  05/04/2020    FEW WBC PRESENT, PREDOMINANTLY PMN RARE GRAM POSITIVE COCCI RARE GRAM NEGATIVE RODS    CULT  05/05/2020    NO GROWTH 6 DAYS Performed at [facility], [address] [place], [place] [address]      Lab Results  Component Value Date   ALBUMIN  3.4 (L) 01/13/2024   ALBUMIN  3.9 12/24/2023   ALBUMIN  2.7 (L) 12/02/2023    Lab  Results  Component Value Date   MG 1.8 12/16/2023   MG 1.8 12/02/2023   MG 1.6 (L) 12/01/2023   No results found for: "VD25OH"  No results found for: "PREALBUMIN"    Latest Ref Rng & Units 01/13/2024    3:48 PM 12/24/2023    3:45 PM 12/16/2023    5:12 AM  CBC EXTENDED  WBC 4.0 - 10.5 K/uL 6.4  4.4  4.3   RBC 4.22 - 5.81 MIL/uL 3.71  3.55  3.07   Hemoglobin 13.0 - 17.0 g/dL 32.3  55.7  8.9   HCT 32.2 - 52.0 % 33.7  32.2  28.3   Platelets 150 - 400 K/uL 216  235  200   NEUT# 1.4 - 7.0 x10E3/uL  2.3    Lymph# 0.7 - 3.1 x10E3/uL  1.4       There is no height or weight on file to calculate BMI.  Orders:  No orders of the defined types were placed in this encounter.  No orders of the defined types were placed in this encounter.    Procedures: No procedures performed  Clinical Data: No additional findings.  ROS:  All other systems negative, except as noted in the HPI. Review of Systems  Objective: Vital Signs: There were no vitals taken for this visit.  Specialty Comments:  No specialty comments available.  PMFS History: Patient Active Problem List   Diagnosis Date Noted   Melena 11/27/2023   GI bleeding 11/26/2023   Hyponatremia 11/26/2023   Hypokalemia 11/26/2023   Influenza A 11/26/2023   S/P coronary artery stent placement 09/25/2023   Nonrheumatic aortic valve stenosis 09/25/2023   Coronary artery disease involving native coronary artery of native heart without angina pectoris 09/15/2023   Acute on chronic systolic heart failure (HCC) 09/14/2023   CHF (congestive heart failure) (HCC) 09/11/2023   HFrEF (heart failure with reduced ejection fraction) (HCC) 08/21/2023   Acute on chronic anemia 08/18/2023   Anemia 08/18/2023   On apixaban  therapy 08/18/2023   GI bleed 08/17/2023   GIB (gastrointestinal bleeding) 08/17/2023   AKI (acute kidney injury) (HCC) 08/17/2023   Respiratory distress 08/17/2023   Rheumatoid arthritis involving multiple sites with  positive rheumatoid factor (HCC) 06/25/2023   Diabetes mellitus treated with oral medication (HCC) 06/03/2023   Non-ST elevation (NSTEMI) myocardial infarction [facility])    Acute systolic heart failure (HCC)    Elevated troponin 05/03/2020   Fever 05/03/2020   PAF (paroxysmal atrial fibrillation) (HCC) 05/02/2020   S/P insertion of iliac artery stent 05/01/2020   Diabetic lipidosis (HCC) 03/10/2020   Intermittent claudication (HCC) 03/10/2020   Compression fracture of T12 vertebra (HCC) 10/27/2018   Spinal stenosis of lumbosacral region 10/27/2018   Small bowel obstruction (HCC) 09/30/2018   Back pain of lumbar region with sciatica 08/26/2018   Chronic left-sided low back pain with left-sided sciatica 08/26/2018   Tremor 08/26/2018   Pure hypercholesterolemia 02/25/2018   Atherosclerosis of aorta (HCC) 02/24/2018   Bilateral hip pain 06/24/2017   Body mass index 30.0-30.9, adult 06/06/2016   Chronic gout with tophus 06/06/2016   Essential hypertension 06/06/2016   Type 2 diabetes mellitus without complication, without long-term current use of insulin  (HCC) 06/06/2016   Hypertension 06/06/2016   Chronic mesenteric ischemia (HCC) 03/01/2014   Peripheral vascular disease (HCC) 02/16/2014   Superior mesenteric artery stenosis (HCC) 02/16/2014   History of colonic polyps 11/24/2013   Past Medical History:  Diagnosis Date   Acute CVA (cerebrovascular accident) (HCC) 05/01/2020   Acute respiratory failure with hypoxia (HCC) 05/04/2020   Arthritis    Atrial fibrillation (HCC)    DM type 2 (diabetes mellitus, type 2) (HCC)    no meds   Endotracheally intubated    Gallstone    Heart murmur    was told one time he had a murmur, not since 30 years ago   HTN (hypertension)    Hyperlipidemia    Myocardial infarct, old    2021   Personal history of colonic polyp-adenoma 11/24/2013   Renal insufficiency    mild   Thoracic spine fracture (HCC)     Family History  Problem Relation Age of  Onset   Diabetes Mother    Heart disease Mother        before age 65   Hyperlipidemia Mother    Hypertension Mother    AAA (abdominal aortic aneurysm) Mother    Stroke Father    Hypertension Father    Coronary artery disease Father    Hyperlipidemia Father    Diabetes Father    Bladder Cancer Father    Cancer Father    Heart disease Father    Heart attack Father    Colon cancer Neg Hx    Throat cancer  Neg Hx    Kidney disease Neg Hx    Liver disease Neg Hx     Past Surgical History:  Procedure Laterality Date   ABDOMINAL AORTAGRAM N/A 02/23/2014   Procedure: ABDOMINAL AORTAGRAM;  Surgeon: Arvil Lauber, MD;  Location: [facility] CATH LAB;  Service: Cardiovascular;  Laterality: N/A;   BIOPSY  08/20/2023   Procedure: BIOPSY;  Surgeon: Daina Drum, MD;  Location: [facility] ENDOSCOPY;  Service: Gastroenterology;;   CATARACT EXTRACTION, BILATERAL     COLONOSCOPY     CORONARY ATHERECTOMY N/A 09/16/2023   Procedure: CORONARY ATHERECTOMY;  Surgeon: Swaziland, Peter M, MD;  Location: [facility] INVASIVE CV LAB;  Service: Cardiovascular;  Laterality: N/A;   CORONARY LITHOTRIPSY N/A 09/16/2023   Procedure: CORONARY LITHOTRIPSY;  Surgeon: Swazil[place], Peter M, MD;  Location: [facility] INVASIVE CV LAB;  Service: Cardiovascular;  Laterality: N/A;   CORONARY STENT INTERVENTION N/A 09/16/2023   Procedure: CORONARY STENT INTERVENTION;  Surgeon: Swaziland, Peter M, MD;  Location: [facility] INVASIVE CV LAB;  Service: Cardiovascular;  Laterality: N/A;   CORONARY ULTRASOUND/IVUS N/A 09/16/2023   Procedure: Coronary Ultrasound/IVUS;  Surgeon: Swazil[place], Peter M, MD;  Location: [facility] INVASIVE CV LAB;  Service: Cardiovascular;  Laterality: N/A;   DEBRIDEMENT TENNIS ELBOW Right    ESOPHAGOGASTRODUODENOSCOPY     ESOPHAGOGASTRODUODENOSCOPY N/A 11/30/2023   Procedure: EGD (ESOPHAGOGASTRODUODENOSCOPY);  Surgeon: Umberto Ganong, Bearl Limes, MD;  Location: [facility] ENDO SUITE;  Service: Gastroenterology;  Laterality: N/A;   ESOPHAGOGASTRODUODENOSCOPY (EGD) WITH  PROPOFOL  N/A 08/20/2023   Procedure: ESOPHAGOGASTRODUODENOSCOPY (EGD) WITH PROPOFOL ;  Surgeon: Daina Drum, MD;  Location: [facility] ENDOSCOPY;  Service: Gastroenterology;  Laterality: N/A;   MESENTERIC ARTERY BYPASS N/A 03/10/2014   Procedure: AORTO TO SUPERIOR MESENTERIC ARTERY BYPASS;  Surgeon: Palma Bob, MD;  Location: [facility] OR;  Service: Vascular;  Laterality: N/A;   RIGHT/LEFT HEART CATH AND CORONARY ANGIOGRAPHY N/A 09/11/2023   Procedure: RIGHT/LEFT HEART CATH AND CORONARY ANGIOGRAPHY;  Surgeon: Darlis Eisenmenger, MD;  Location: [facility] INVASIVE CV LAB;  Service: Cardiovascular;  Laterality: N/A;   TONSILLECTOMY AND ADENOIDECTOMY     vascualr surgery     illiac stents   Social History   Occupational History   Occupation: Retired  Tobacco Use   Smoking status: Former    Current packs/day: 0.00    Types: Cigarettes    Quit date: 07/08/2009    Years since quitting: 14.6    Passive exposure: Past   Smokeless tobacco: Never  Vaping Use   Vaping status: Never Used  Substance and Sexual Activity   Alcohol use: Not Currently   Drug use: Not Currently   Sexual activity: Yes

## 2024-02-26 ENCOUNTER — Encounter: Payer: Medicare Other | Admitting: Internal Medicine

## 2024-02-27 ENCOUNTER — Ambulatory Visit: Admitting: Orthopedic Surgery

## 2024-02-27 ENCOUNTER — Other Ambulatory Visit: Payer: Self-pay | Admitting: Family Medicine

## 2024-02-27 DIAGNOSIS — S8011XA Contusion of right lower leg, initial encounter: Secondary | ICD-10-CM | POA: Diagnosis not present

## 2024-02-28 ENCOUNTER — Encounter: Payer: Self-pay | Admitting: Orthopedic Surgery

## 2024-02-28 ENCOUNTER — Telehealth: Payer: Self-pay | Admitting: Pharmacist

## 2024-02-28 NOTE — Progress Notes (Signed)
 Office Visit Note   Patient: Connor Bryant           Date of Birth: [birth year]           MRN: [id number] Visit Date: 02/27/2024              Requested by: Roise Cleaver, MD [address] [place],  [place] [address] PCP: Roise Cleaver, MD  Chief Complaint  Patient presents with   Right Leg - Wound Check      HPI: Patient is a 78 year old gentleman with a hematoma right calf.  Patient has been trying compression socks but he states the cut off his circulation.  Assessment & Plan: Visit Diagnoses:  1. Hematoma of right lower leg     Plan: The venous ulcer continues to improve.  Continue with compression and follow-up in 4 weeks  Follow-Up Instructions: Return in about 4 weeks (around 03/26/2024).   Ortho Exam  Patient is alert, oriented, no adenopathy, well-dressed, normal affect, normal respiratory effort. Examination the wound has no fluctuance there is no open wound or drainage.  This area of hematoma is approximately 3 cm at this time there is no tenderness to palpation.  No cellulitis.    Imaging: No results found. No images are attached to the encounter.  Labs: Lab Results  Component Value Date   HGBA1C 5.9 (H) 12/24/2023   HGBA1C 6.6 (H) 08/20/2023   HGBA1C 6.8 (H) 08/17/2023   ESRSEDRATE 43 (H) 04/15/2023   ESRSEDRATE 96 (H) 05/05/2020   ESRSEDRATE 50 (H) 12/18/2013   REPTSTATUS 05/11/2020 FINAL 05/05/2020   GRAMSTAIN  05/04/2020    FEW WBC PRESENT, PREDOMINANTLY PMN RARE GRAM POSITIVE COCCI RARE GRAM NEGATIVE RODS    CULT  05/05/2020    NO GROWTH 6 DAYS Performed at [facility], [address], [place], [place] [address]      Lab Results  Component Value Date   ALBUMIN  3.4 (L) 01/13/2024   ALBUMIN  3.9 12/24/2023   ALBUMIN  2.7 (L) 12/02/2023    Lab Results  Component Value Date   MG 1.8 12/16/2023   MG 1.8 12/02/2023   MG 1.6 (L) 12/01/2023   No results found for: "VD25OH"  No results found for: "PREALBUMIN"    Latest Ref Rng & Units  01/13/2024    3:48 PM 12/24/2023    3:45 PM 12/16/2023    5:12 AM  CBC EXTENDED  WBC 4.0 - 10.5 K/uL 6.4  4.4  4.3   RBC 4.22 - 5.81 MIL/uL 3.71  3.55  3.07   Hemoglobin 13.0 - 17.0 g/dL 52.8  41.3  8.9   HCT 24.4 - 52.0 % 33.7  32.2  28.3   Platelets 150 - 400 K/uL 216  235  200   NEUT# 1.4 - 7.0 x10E3/uL  2.3    Lymph# 0.7 - 3.1 x10E3/uL  1.4       There is no height or weight on file to calculate BMI.  Orders:  No orders of the defined types were placed in this encounter.  No orders of the defined types were placed in this encounter.    Procedures: No procedures performed  Clinical Data: No additional findings.  ROS:  All other systems negative, except as noted in the HPI. Review of Systems  Objective: Vital Signs: There were no vitals taken for this visit.  Specialty Comments:  No specialty comments available.  PMFS History: Patient Active Problem List   Diagnosis Date Noted   Melena 11/27/2023  GI bleeding 11/26/2023   Hyponatremia 11/26/2023   Hypokalemia 11/26/2023   Influenza A 11/26/2023   S/P coronary artery stent placement 09/25/2023   Nonrheumatic aortic valve stenosis 09/25/2023   Coronary artery disease involving native coronary artery of native heart without angina pectoris 09/15/2023   Acute on chronic systolic heart failure (HCC) 09/14/2023   CHF (congestive heart failure) (HCC) 09/11/2023   HFrEF (heart failure with reduced ejection fraction) (HCC) 08/21/2023   Acute on chronic anemia 08/18/2023   Anemia 08/18/2023   On apixaban  therapy 08/18/2023   GI bleed 08/17/2023   GIB (gastrointestinal bleeding) 08/17/2023   AKI (acute kidney injury) (HCC) 08/17/2023   Respiratory distress 08/17/2023   Rheumatoid arthritis involving multiple sites with positive rheumatoid factor (HCC) 06/25/2023   Diabetes mellitus treated with oral medication (HCC) 06/03/2023   Non-ST elevation (NSTEMI) myocardial infarction [facility])    Acute systolic heart failure  (HCC)    Elevated troponin 05/03/2020   Fever 05/03/2020   PAF (paroxysmal atrial fibrillation) (HCC) 05/02/2020   S/P insertion of iliac artery stent 05/01/2020   Diabetic lipidosis (HCC) 03/10/2020   Intermittent claudication (HCC) 03/10/2020   Compression fracture of T12 vertebra (HCC) 10/27/2018   Spinal stenosis of lumbosacral region 10/27/2018   Small bowel obstruction (HCC) 09/30/2018   Back pain of lumbar region with sciatica 08/26/2018   Chronic left-sided low back pain with left-sided sciatica 08/26/2018   Tremor 08/26/2018   Pure hypercholesterolemia 02/25/2018   Atherosclerosis of aorta (HCC) 02/24/2018   Bilateral hip pain 06/24/2017   Body mass index 30.0-30.9, adult 06/06/2016   Chronic gout with tophus 06/06/2016   Essential hypertension 06/06/2016   Type 2 diabetes mellitus without complication, without long-term current use of insulin  (HCC) 06/06/2016   Hypertension 06/06/2016   Chronic mesenteric ischemia (HCC) 03/01/2014   Peripheral vascular disease (HCC) 02/16/2014   Superior mesenteric artery stenosis (HCC) 02/16/2014   History of colonic polyps 11/24/2013   Past Medical History:  Diagnosis Date   Acute CVA (cerebrovascular accident) (HCC) 05/01/2020   Acute respiratory failure with hypoxia (HCC) 05/04/2020   Arthritis    Atrial fibrillation (HCC)    DM type 2 (diabetes mellitus, type 2) (HCC)    no meds   Endotracheally intubated    Gallstone    Heart murmur    was told one time he had a murmur, not since 30 years ago   HTN (hypertension)    Hyperlipidemia    Myocardial infarct, old    2021   Personal history of colonic polyp-adenoma 11/24/2013   Renal insufficiency    mild   Thoracic spine fracture (HCC)     Family History  Problem Relation Age of Onset   Diabetes Mother    Heart disease Mother        before age 90   Hyperlipidemia Mother    Hypertension Mother    AAA (abdominal aortic aneurysm) Mother    Stroke Father    Hypertension  Father    Coronary artery disease Father    Hyperlipidemia Father    Diabetes Father    Bladder Cancer Father    Cancer Father    Heart disease Father    Heart attack Father    Colon cancer Neg Hx    Throat cancer Neg Hx    Kidney disease Neg Hx    Liver disease Neg Hx     Past Surgical History:  Procedure Laterality Date   ABDOMINAL AORTAGRAM N/A 02/23/2014   Procedure: ABDOMINAL  Tommi Fraise;  Surgeon: Arvil Lauber, MD;  Location: [facility] CATH LAB;  Service: Cardiovascular;  Laterality: N/A;   BIOPSY  08/20/2023   Procedure: BIOPSY;  Surgeon: Daina Drum, MD;  Location: [facility] ENDOSCOPY;  Service: Gastroenterology;;   CATARACT EXTRACTION, BILATERAL     COLONOSCOPY     CORONARY ATHERECTOMY N/A 09/16/2023   Procedure: CORONARY ATHERECTOMY;  Surgeon: Swaziland, Peter M, MD;  Location: [facility] INVASIVE CV LAB;  Service: Cardiovascular;  Laterality: N/A;   CORONARY LITHOTRIPSY N/A 09/16/2023   Procedure: CORONARY LITHOTRIPSY;  Surgeon: Swaziland, Peter M, MD;  Location: [facility] INVASIVE CV LAB;  Service: Cardiovascular;  Laterality: N/A;   CORONARY STENT INTERVENTION N/A 09/16/2023   Procedure: CORONARY STENT INTERVENTION;  Surgeon: Swaziland, Peter M, MD;  Location: [facility] INVASIVE CV LAB;  Service: Cardiovascular;  Laterality: N/A;   CORONARY ULTRASOUND/IVUS N/A 09/16/2023   Procedure: Coronary Ultrasound/IVUS;  Surgeon: Swaziland, Peter M, MD;  Location: [facility] INVASIVE CV LAB;  Service: Cardiovascular;  Laterality: N/A;   DEBRIDEMENT TENNIS ELBOW Right    ESOPHAGOGASTRODUODENOSCOPY     ESOPHAGOGASTRODUODENOSCOPY N/A 11/30/2023   Procedure: EGD (ESOPHAGOGASTRODUODENOSCOPY);  Surgeon: Umberto Ganong, Bearl Limes, MD;  Location: [facility] ENDO SU[facility];  Service: Gastroenterology;  Laterality: N/A;   ESOPHAGOGASTRODUODENOSCOPY (EGD) WITH PROPOFOL  N/A 08/20/2023   Procedure: ESOPHAGOGASTRODUODENOSCOPY (EGD) WITH PROPOFOL ;  Surgeon: Daina Drum, MD;  Location: [facility] ENDOSCOPY;  Service: Gastroenterology;  Laterality: N/A;   MESENTERIC  ARTERY BYPASS N/A 03/10/2014   Procedure: AORTO TO SUPERIOR MESENTERIC ARTERY BYPASS;  Surgeon: Palma Bob, MD;  Location: [facility] OR;  Service: Vascular;  Laterality: N/A;   RIGHT/LEFT HEART CATH AND CORONARY ANGIOGRAPHY N/A 09/11/2023   Procedure: RIGHT/LEFT HEART CATH AND CORONARY ANGIOGRAPHY;  Surgeon: Darlis Eisenmenger, MD;  Location: [facility] INVASIVE CV LAB;  Service: Cardiovascular;  Laterality: N/A;   TONSILLECTOMY AND ADENOIDECTOMY     vascualr surgery     illiac stents   Social History   Occupational History   Occupation: Retired  Tobacco Use   Smoking status: Former    Current packs/day: 0.00    Types: Cigarettes    Quit date: 07/08/2009    Years since quitting: 14.6    Passive exposure: Past   Smokeless tobacco: Never  Vaping Use   Vaping status: Never Used  Substance and Sexual Activity   Alcohol use: Not Currently   Drug use: Not Currently   Sexual activity: Yes

## 2024-02-28 NOTE — Telephone Encounter (Signed)
   This patient is appearing on a report for being at risk of failing the adherence measure for diabetes medications this calendar year.   Medication: Farxiga  10mg  daily Last fill date: 02/27/24 for 30 day supply  Insurance report was not up to date. No action needed at this time.    Maudene Stotler Dattero Lonney Revak, PharmD, BCACP, CPP Clinical Pharmacist, [facility]

## 2024-03-02 ENCOUNTER — Telehealth: Payer: Self-pay

## 2024-03-02 DIAGNOSIS — I4819 Other persistent atrial fibrillation: Secondary | ICD-10-CM

## 2024-03-02 DIAGNOSIS — I5022 Chronic systolic (congestive) heart failure: Secondary | ICD-10-CM

## 2024-03-02 NOTE — Telephone Encounter (Signed)
-----   Message from Peter Swaziland sent at 02/06/2024  6:50 AM EDT ----- Regarding: RE: [employer] patient I agree with Alen Husbands. 6 months seems reasonable.   Peter Swaziland MD, [facility] ----- Message ----- From: Darlis Eisenmenger, MD Sent: 02/05/2024   5:17 PM EDT To: Peter M Swaziland, MD; Allison Ivory, RN; # Subject: RE: [employer] patient                             I would be ok with holding Plavix  after 6 months for ablation/Watchman but would like Dr. Swaziland to weigh in (did his PCI in 12/25). ----- Message ----- From: Ardeen Kohler, MD Sent: 02/05/2024  12:03 PM EDT To: Darlis Eisenmenger, MD; Peter M Swaziland, MD; # Subject: [employer] patient                                 I saw this patient yesterday for watchman and atrial fibrillation ablation evaluation.  He underwent PCI to proximal LAD and RCA with Dr. Swaziland in December 2020 for.  Unfortunately, he has been unable to tolerate both Eliquis  and Plavix  together due to multiple bleeding events.  He is currently off Eliquis  and taking Plavix  alone, change recently made by Dr. Mitzie Anda.  Ideally, patient would undergo concomitant watchman implant and catheter ablation as this would best mitigate his long-term bleeding risk.  Unfortunately, our imaging colleagues have made it difficult to arrange these procedures.  For ablation to be performed, whether concomitant or not, patient would need Eliquis  3 weeks prior to ablation and 3 months after.  At what point, would you feel comfortable stopping Plavix  and having him on Eliquis  and aspirin ?  With 6 months post PCI be sufficient?  If watchman implant is unable to be done concomitantly with ablation, then Watchman implant could be performed 3 months after ablation on dual antiplatelet therapy after stopping Eliquis .  Just let me know your thoughts.  Thanks, [employer]

## 2024-03-02 NOTE — Telephone Encounter (Signed)
 Spoke with the patient at length.  Due to wait for concomitant procedure, the patient wishes to proceed with staged ablation then Watchman a couple months later.   Will route to EP team to arrange ablation and cCT.

## 2024-03-03 ENCOUNTER — Telehealth: Payer: Self-pay

## 2024-03-03 NOTE — Telephone Encounter (Signed)
 LM for pt to call back to schedule Afib Ablation with Dr. Daneil Dunker.

## 2024-03-03 NOTE — Telephone Encounter (Signed)
 LM for pt to call back.

## 2024-03-04 NOTE — Telephone Encounter (Signed)
 Spoke with the patient and scheduled him for an ablation on 8/18 with Dr. Daneil Dunker.

## 2024-03-04 NOTE — Telephone Encounter (Signed)
 Patient is returning call.

## 2024-03-04 NOTE — Telephone Encounter (Signed)
 Left message for patient to call back

## 2024-03-12 ENCOUNTER — Other Ambulatory Visit: Payer: Self-pay | Admitting: *Deleted

## 2024-03-12 NOTE — Patient Outreach (Signed)
 Complex Care Management   Visit Note  03/12/2024  Name:  Connor Bryant MRN: [id number] DOB: 1945/09/28  Situation: Referral received for Complex Care Management related to Heart Failure and Atrial Fibrillation I obtained verbal consent from Patient.  Visit completed with patient  on the phone  Background:   Past Medical History:  Diagnosis Date   Acute CVA (cerebrovascular accident) (HCC) 05/01/2020   Acute respiratory failure with hypoxia (HCC) 05/04/2020   Arthritis    Atrial fibrillation (HCC)    DM type 2 (diabetes mellitus, type 2) (HCC)    no meds   Endotracheally intubated    Gallstone    Heart murmur    was told one time he had a murmur, not since 30 years ago   HTN (hypertension)    Hyperlipidemia    Myocardial infarct, old    2021   Personal history of colonic polyp-adenoma 11/24/2013   Renal insufficiency    mild   Thoracic spine fracture (HCC)     Assessment: Patient Reported Symptoms:  Cognitive Cognitive Status: Alert and oriented to person, place, and time, Insightful and able to interpret abstract concepts, Normal speech and language skills Cognitive/Intellectual Conditions Management [RPT]: None reported or documented in medical history or problem list   Health Maintenance Behaviors: Annual physical exam Healing Pattern: Average Health Facilitated by: Rest  Neurological Neurological Review of Symptoms: No symptoms reported    HEENT HEENT Symptoms Reported: No symptoms reported      Cardiovascular Cardiovascular Symptoms Reported: No symptoms reported Does patient have uncontrolled Hypertension?: Yes Is patient checking Blood Pressure at home?: Yes Patient's Recent BP reading at home: 120/65 Cardiovascular Conditions: Dysrhythmia, Heart failure, High blood cholesterol, Hypertension Cardiovascular Management Strategies: Medication therapy, Routine screening Weight: 173 lb (78.5 kg)  Respiratory Respiratory Symptoms Reported: No symptoms reported     Endocrine Patient reports the following symptoms related to hypoglycemia or hyperglycemia : No symptoms reported Is patient diabetic?: Yes Is patient checking blood sugars at home?: No Endocrine Conditions: Diabetes Endocrine Management Strategies: Diet modification  Gastrointestinal Gastrointestinal Symptoms Reported: No symptoms reported   Nutrition Risk Screen (CP): No indicators present  Genitourinary Genitourinary Symptoms Reported: No symptoms reported    Integumentary Integumentary Symptoms Reported: Wound Additional Integumentary Details: Right leg venous ulcer Skin Conditions: Wound Skin Management Strategies: Dressing changes Skin Self-Management Outcome: 4 (good)  Musculoskeletal Musculoskelatal Symptoms Reviewed: No symptoms reported   Falls in the past year?: Yes Number of falls in past year: 1 or less Was there an injury with Fall?: No Fall Risk Category Calculator: 1 Patient Fall Risk Level: Low Fall Risk Patient at Risk for Falls Due to: History of fall(s) Fall risk Follow up: Falls evaluation completed, Education provided, Falls prevention discussed  Psychosocial Psychosocial Symptoms Reported: No symptoms reported     Quality of Family Relationships: involved, helpful, supportive Do you feel physically threatened by others?: No      03/12/2024    2:13 PM  Depression screen PHQ 2/9  Decreased Interest 0  Down, Depressed, Hopeless 0  PHQ - 2 Score 0    Vitals:   03/10/24 1409  BP: 120/65    Medications Reviewed Today     Reviewed by Remona Carmel, RN (Registered Nurse) on 03/12/24 at 1418  Med List Status: <None>   Medication Order Taking? Sig Documenting Provider Last Dose Status Informant  Accu-Chek Softclix Lancets lancets [id number] Yes SMARTSIG:Topical 1-3 Times Daily [provider]  Active   allopurinol  (ZYLOPRIM ) 100 MG  tablet [id number] Yes TAKE 1 TABLET 2 TO 3 TIMES A DAY FOR GOUT Stacks, Warren, MD  Active   amiodarone   (PACERONE ) 200 MG tablet [id number] Yes Take 1 tablet (200 mg total) by mouth daily. Darlis Eisenmenger, MD  Active   atorvastatin  (LIPITOR ) 80 MG tablet [id number] Yes Take 1 tablet (80 mg total) by mouth daily. Roise Cleaver, MD  Active            Med Note Author Board, ANGELICA G   Sat Dec 14, 2023  2:58 PM)    Blood Glucose Monitoring Suppl (ACCU-CHEK GUIDE) w/Device Suzanne Erps [id number] Yes CHECK BLOOD SUGAR UP TO THREE TIMES DAILY AS DIRECTED [provider]  Active   Blood Glucose Monitoring Suppl DEVI [id number] Yes 1 each by Does not apply route in the morning, at noon, and at bedtime. May substitute to any manufacturer covered by patient's insurance. Roise Cleaver, MD  Active Self  Calcium  Carb-Cholecalciferol (CALCIUM  500 + D PO) [id number] Yes Take 1 tablet by mouth daily. [provider]  Active Self  carbidopa -levodopa  (SINEMET ) 10-100 MG tablet [id number] Yes Take 1 tablet by mouth 3 (three) times daily. For parkinsonism Roise Cleaver, MD  Active   carvedilol  (COREG ) 6.25 MG tablet [id number] Yes Take 1 tablet (6.25 mg total) by mouth 2 (two) times daily. Darlis Eisenmenger, MD  Active   clopidogrel  (PLAVIX ) 75 MG tablet [id number] Yes Take 1 tablet (75 mg total) by mouth daily. Darlis Eisenmenger, MD  Active   dapagliflozin  propanediol (FARXIGA ) 10 MG TABS tablet [id number] Yes Take 1 tablet (10 mg total) by mouth daily. Vernia Good [place], [place]  Active   ferrous sulfate  325 (65 FE) MG tablet [id number] Yes Take 1 tablet (325 mg total) by mouth daily. Lee, Sw[place], NP  Active   isosorbide -hydrALAZINE  (BIDIL ) 20-37.5 MG tablet [id number] Yes Take 1.5 tablets by mouth 3 (three) times daily. Vernia Good [place], [place]  Active   pantoprazole  (PROTONIX ) 40 MG tablet [id number] Yes TAKE ONE TABLET TWICE DAILY Roise Cleaver, MD  Active   spironolactone  (ALDACTONE ) 25 MG tablet [id number] Yes Take 0.5 tablets (12.5 mg total) by mouth daily. Darlis Eisenmenger, MD  Active   sulfamethoxazole -trimethoprim   (BACTRIM  DS) 800-160 MG tablet [id number]  Take 1 tablet by mouth 2 (two) times daily. Until gone, for infection  Patient not taking: Reported on 03/12/2024   Roise Cleaver, MD  Consider Medication Status and Discontinue             Recommendation:   Continue Current Plan of Care  Follow Up Plan:   Telephone follow up appointment date/time:  04-16-2024 at 2:00 pm  Grandville Lax, BSN RN [facility], [facility] RN Care Manager Direct Dial: [phone]  Fax: [phone]

## 2024-03-12 NOTE — Patient Instructions (Signed)
 Visit Information  Thank you for taking time to visit with me today. Please don't hesitate to contact me if I can be of assistance to you before our next scheduled appointment.  Your next care management appointment is by telephone on 04-16-2024 at 2:00 pm  Telephone follow-up 5 weeks  Please call the care guide team at [phone] if you need to cancel, schedule, or reschedule an appointment.   Please call the Suicide and Crisis Lifeline: 988 call the [place]  National Suicide Prevention Lifeline: 9061069120 or TTY: 5047510746 TTY 603-404-4157) to talk to a trained counselor call 1-800-273-TALK (toll free, 24 hour hotline) call the [facility]: 249-856-7361 call 911 if you are experiencing a Mental Health or Behavioral Health Crisis or need someone to talk to.  Grandville Lax, BSN RN [facility], [facility] RN Care Manager Direct Dial: [phone]  Fax: [phone]

## 2024-03-23 DIAGNOSIS — I4819 Other persistent atrial fibrillation: Secondary | ICD-10-CM | POA: Diagnosis not present

## 2024-03-23 DIAGNOSIS — I5022 Chronic systolic (congestive) heart failure: Secondary | ICD-10-CM | POA: Diagnosis not present

## 2024-03-24 ENCOUNTER — Ambulatory Visit: Admitting: Family Medicine

## 2024-03-24 LAB — BASIC METABOLIC PANEL WITH GFR
BUN/Creatinine Ratio: 18 (ref 10–24)
BUN: 31 mg/dL — AB (ref 8–27)
CO2: 19 mmol/L — AB (ref 20–29)
Calcium: 9.4 mg/dL (ref 8.6–10.2)
Chloride: 104 mmol/L (ref 96–106)
Creatinine, Ser: 1.73 mg/dL — AB (ref 0.76–1.27)
Glucose: 110 mg/dL — AB (ref 70–99)
Potassium: 4.5 mmol/L (ref 3.5–5.2)
Sodium: 142 mmol/L (ref 134–144)
eGFR: 40 mL/min/{1.73_m2} — AB (ref >59)

## 2024-03-24 LAB — CBC
Hematocrit: 40.3 % (ref 37.5–51.0)
Hemoglobin: 12.4 g/dL — ABNORMAL LOW (ref 13.0–17.7)
MCH: 28.2 pg (ref 26.6–33.0)
MCHC: 30.8 g/dL — ABNORMAL LOW (ref 31.5–35.7)
MCV: 92 fL (ref 79–97)
Platelets: 218 10*3/uL (ref 150–450)
RBC: 4.4 x10E6/uL (ref 4.14–5.80)
RDW: 15.4 % (ref 11.6–15.4)
WBC: 5.7 10*3/uL (ref 3.4–10.8)

## 2024-03-26 ENCOUNTER — Ambulatory Visit: Admitting: Orthopedic Surgery

## 2024-03-26 DIAGNOSIS — S8011XA Contusion of right lower leg, initial encounter: Secondary | ICD-10-CM

## 2024-03-30 ENCOUNTER — Other Ambulatory Visit: Payer: Self-pay | Admitting: Family Medicine

## 2024-03-31 ENCOUNTER — Encounter: Payer: Self-pay | Admitting: Orthopedic Surgery

## 2024-03-31 NOTE — Progress Notes (Signed)
 Office Visit Note   Patient: Connor Bryant           Date of Birth: 09-05-1946           MRN: [id number] Visit Date: 03/26/2024              Requested by: Zollie Lowers, MD [address] [place],  [place] [address] PCP: Zollie Lowers, MD  Chief Complaint  Patient presents with   Right Leg - Wound Check      HPI: Patient is a 78 year old gentleman who presents in follow-up for hematoma right lower extremity.  Patient has been using compression.  He states the hematoma continues to resolve.  Assessment & Plan: Visit Diagnoses:  1. Hematoma of right lower leg     Plan: Patient will follow-up as needed continue with the compression.  Follow-Up Instructions: Return if symptoms worsen or fail to improve.   Ortho Exam  Patient is alert, oriented, no adenopathy, well-dressed, normal affect, normal respiratory effort. Examination the hematoma continues resolved.  Measures approximately 1 cm in diameter the skin is intact there is no ulceration no cellulitis.    Imaging: No results found. No images are attached to the encounter.  Labs: Lab Results  Component Value Date   HGBA1C 5.9 (H) 12/24/2023   HGBA1C 6.6 (H) 08/20/2023   HGBA1C 6.8 (H) 08/17/2023   ESRSEDRATE 43 (H) 04/15/2023   ESRSEDRATE 96 (H) 05/05/2020   ESRSEDRATE 50 (H) 12/18/2013   REPTSTATUS 05/11/2020 FINAL 05/05/2020   GRAMSTAIN  05/04/2020    FEW WBC PRESENT, PREDOMINANTLY PMN RARE GRAM POSITIVE COCCI RARE GRAM NEGATIVE RODS    CULT  05/05/2020    NO GROWTH 6 DAYS Performed at [facility], [address] [place], [place] [address]      Lab Results  Component Value Date   ALBUMIN  3.4 (L) 01/13/2024   ALBUMIN  3.9 12/24/2023   ALBUMIN  2.7 (L) 12/02/2023    Lab Results  Component Value Date   MG 1.8 12/16/2023   MG 1.8 12/02/2023   MG 1.6 (L) 12/01/2023   No results found for: VD25OH  No results found for: PREALBUMIN    Latest Ref Rng & Units 03/23/2024    2:00 PM 01/13/2024     3:48 PM 12/24/2023    3:45 PM  CBC EXTENDED  WBC 3.4 - 10.8 x10E3/uL 5.7  6.4  4.4   RBC 4.14 - 5.80 x10E6/uL 4.40  3.71  3.55   Hemoglobin 13.0 - 17.7 g/dL 87.5  89.4  89.7   HCT 37.5 - 51.0 % 40.3  33.7  32.2   Platelets 150 - 450 x10E3/uL 218  216  235   NEUT# 1.4 - 7.0 x10E3/uL   2.3   Lymph# 0.7 - 3.1 x10E3/uL   1.4      There is no height or weight on file to calculate BMI.  Orders:  No orders of the defined types were placed in this encounter.  No orders of the defined types were placed in this encounter.    Procedures: No procedures performed  Clinical Data: No additional findings.  ROS:  All other systems negative, except as noted in the HPI. Review of Systems  Objective: Vital Signs: There were no vitals taken for this visit.  Specialty Comments:  No specialty comments available.  PMFS History: Patient Active Problem List   Diagnosis Date Noted   Melena 11/27/2023   GI bleeding 11/26/2023   Hyponatremia 11/26/2023   Hypokalemia 11/26/2023  Influenza A 11/26/2023   S/P coronary artery stent placement 09/25/2023   Nonrheumatic aortic valve stenosis 09/25/2023   Coronary artery disease involving native coronary artery of native heart without angina pectoris 09/15/2023   Acute on chronic systolic heart failure (HCC) 09/14/2023   CHF (congestive heart failure) (HCC) 09/11/2023   HFrEF (heart failure with reduced ejection fraction) (HCC) 08/21/2023   Acute on chronic anemia 08/18/2023   Anemia 08/18/2023   On apixaban  therapy 08/18/2023   GI bleed 08/17/2023   GIB (gastrointestinal bleeding) 08/17/2023   AKI (acute kidney injury) (HCC) 08/17/2023   Respiratory distress 08/17/2023   Rheumatoid arthritis involving multiple sites with positive rheumatoid factor (HCC) 06/25/2023   Diabetes mellitus treated with oral medication (HCC) 06/03/2023   Non-ST elevation (NSTEMI) myocardial infarction [facility])    Acute systolic heart failure (HCC)    Elevated  troponin 05/03/2020   Fever 05/03/2020   PAF (paroxysmal atrial fibrillation) (HCC) 05/02/2020   S/P insertion of iliac artery stent 05/01/2020   Diabetic lipidosis (HCC) 03/10/2020   Intermittent claudication (HCC) 03/10/2020   Compression fracture of T12 vertebra (HCC) 10/27/2018   Spinal stenosis of lumbosacral region 10/27/2018   Small bowel obstruction (HCC) 09/30/2018   Back pain of lumbar region with sciatica 08/26/2018   Chronic left-sided low back pain with left-sided sciatica 08/26/2018   Tremor 08/26/2018   Pure hypercholesterolemia 02/25/2018   Atherosclerosis of aorta (HCC) 02/24/2018   Bilateral hip pain 06/24/2017   Body mass index 30.0-30.9, adult 06/06/2016   Chronic gout with tophus 06/06/2016   Essential hypertension 06/06/2016   Type 2 diabetes mellitus without complication, without long-term current use of insulin  (HCC) 06/06/2016   Hypertension 06/06/2016   Chronic mesenteric ischemia (HCC) 03/01/2014   Peripheral vascular disease (HCC) 02/16/2014   Superior mesenteric artery stenosis (HCC) 02/16/2014   History of colonic polyps 11/24/2013   Past Medical History:  Diagnosis Date   Acute CVA (cerebrovascular accident) (HCC) 05/01/2020   Acute respiratory failure with hypoxia (HCC) 05/04/2020   Arthritis    Atrial fibrillation (HCC)    DM type 2 (diabetes mellitus, type 2) (HCC)    no meds   Endotracheally intubated    Gallstone    Heart murmur    was told one time he had a murmur, not since 30 years ago   HTN (hypertension)    Hyperlipidemia    Myocardial infarct, old    2021   Personal history of colonic polyp-adenoma 11/24/2013   Renal insufficiency    mild   Thoracic spine fracture (HCC)     Family History  Problem Relation Age of Onset   Diabetes Mother    Heart disease Mother        before age 19   Hyperlipidemia Mother    Hypertension Mother    AAA (abdominal aortic aneurysm) Mother    Stroke Father    Hypertension Father    Coronary  artery disease Father    Hyperlipidemia Father    Diabetes Father    Bladder Cancer Father    Cancer Father    Heart disease Father    Heart attack Father    Colon cancer Neg Hx    Throat cancer Neg Hx    Kidney disease Neg Hx    Liver disease Neg Hx     Past Surgical History:  Procedure Laterality Date   ABDOMINAL AORTAGRAM N/A 02/23/2014   Procedure: ABDOMINAL EZELLA;  Surgeon: Redell LITTIE Door, MD;  Location: [facility] CATH LAB;  Service: Cardiovascular;  Laterality: N/A;   BIOPSY  08/20/2023   Procedure: BIOPSY;  Surgeon: Federico Rosario BROCKS, MD;  Location: [facility] ENDOSCOPY;  Service: Gastroenterology;;   CATARACT EXTRACTION, BILATERAL     COLONOSCOPY     CORONARY ATHERECTOMY N/A 09/16/2023   Procedure: CORONARY ATHERECTOMY;  Surgeon: Swaziland, Peter M, MD;  Location: [facility] INVASIVE CV LAB;  Service: Cardiovascular;  Laterality: N/A;   CORONARY LITHOTRIPSY N/A 09/16/2023   Procedure: CORONARY LITHOTRIPSY;  Surgeon: Swaziland, Peter M, MD;  Location: [facility] INVASIVE CV LAB;  Service: Cardiovascular;  Laterality: N/A;   CORONARY STENT INTERVENTION N/A 09/16/2023   Procedure: CORONARY STENT INTERVENTION;  Surgeon: Swaziland, Peter M, MD;  Location: [facility] INVASIVE CV LAB;  Service: Cardiovascular;  Laterality: N/A;   CORONARY ULTRASOUND/IVUS N/A 09/16/2023   Procedure: Coronary Ultrasound/IVUS;  Surgeon: Swaziland, Peter M, MD;  Location: [facility] INVASIVE CV LAB;  Service: Cardiovascular;  Laterality: N/A;   DEBRIDEMENT TENNIS ELBOW Right    ESOPHAGOGASTRODUODENOSCOPY     ESOPHAGOGASTRODUODENOSCOPY N/A 11/30/2023   Procedure: EGD (ESOPHAGOGASTRODUODENOSCOPY);  Surgeon: Eartha Flavors, Toribio, MD;  Location: [facility] ENDO SU[facility];  Service: Gastroenterology;  Laterality: N/A;   ESOPHAGOGASTRODUODENOSCOPY (EGD) WITH PROPOFOL  N/A 08/20/2023   Procedure: ESOPHAGOGASTRODUODENOSCOPY (EGD) WITH PROPOFOL ;  Surgeon: Federico Rosario BROCKS, MD;  Location: [facility] ENDOSCOPY;  Service: Gastroenterology;  Laterality: N/A;   MESENTERIC ARTERY BYPASS N/A  03/10/2014   Procedure: AORTO TO SUPERIOR MESENTERIC ARTERY BYPASS;  Surgeon: Lynwood JONETTA Collum, MD;  Location: [facility] OR;  Service: Vascular;  Laterality: N/A;   RIGHT/LEFT HEART CATH AND CORONARY ANGIOGRAPHY N/A 09/11/2023   Procedure: RIGHT/LEFT HEART CATH AND CORONARY ANGIOGRAPHY;  Surgeon: Rolan Ezra RAMAN, MD;  Location: [facility] INVASIVE CV LAB;  Service: Cardiovascular;  Laterality: N/A;   TONSILLECTOMY AND ADENOIDECTOMY     vascualr surgery     illiac stents   Social History   Occupational History   Occupation: Retired  Tobacco Use   Smoking status: Former    Current packs/day: 0.00    Types: Cigarettes    Quit date: 07/08/2009    Years since quitting: 14.7    Passive exposure: Past   Smokeless tobacco: Never  Vaping Use   Vaping status: Never Used  Substance and Sexual Activity   Alcohol use: Not Currently   Drug use: Not Currently   Sexual activity: Yes

## 2024-04-11 ENCOUNTER — Ambulatory Visit: Payer: Self-pay | Admitting: Cardiology

## 2024-04-13 ENCOUNTER — Other Ambulatory Visit: Payer: Self-pay | Admitting: Family Medicine

## 2024-04-14 ENCOUNTER — Other Ambulatory Visit: Payer: Self-pay

## 2024-04-14 ENCOUNTER — Telehealth: Payer: Self-pay

## 2024-04-14 DIAGNOSIS — I4819 Other persistent atrial fibrillation: Secondary | ICD-10-CM

## 2024-04-14 NOTE — Telephone Encounter (Signed)
 LM for pt to call me back to discuss CT/Ablation Instructions. He has had labs that will cover his CT but he will need updated labs for his Ablation. He can get these labs while here for his CT.  He needs to start his Eliquis  on 7/28 which is 3 weeks prior to his Ablation on 8/18 with Dr. Kennyth.   I have completed his Ablation letter and mailed both CT and Ablation letter to him. They were also sent via MyChart.

## 2024-04-16 ENCOUNTER — Telehealth: Payer: Self-pay | Admitting: *Deleted

## 2024-04-20 ENCOUNTER — Ambulatory Visit (HOSPITAL_COMMUNITY)
Admission: RE | Admit: 2024-04-20 | Discharge: 2024-04-20 | Disposition: A | Discharge status: Home / Self Care | Source: Ambulatory Visit | Attending: Cardiology | Admitting: Cardiology

## 2024-04-20 DIAGNOSIS — I7123 Aneurysm of the descending thoracic aorta, without rupture: Secondary | ICD-10-CM | POA: Diagnosis not present

## 2024-04-20 DIAGNOSIS — I4819 Other persistent atrial fibrillation: Secondary | ICD-10-CM | POA: Insufficient documentation

## 2024-04-20 DIAGNOSIS — I5022 Chronic systolic (congestive) heart failure: Secondary | ICD-10-CM | POA: Diagnosis not present

## 2024-04-20 DIAGNOSIS — I700 Atherosclerosis of aorta: Secondary | ICD-10-CM | POA: Diagnosis not present

## 2024-04-20 MED ORDER — IOHEXOL 350 MG/ML SOLN
80.0000 mL | Freq: Once | INTRAVENOUS | Status: AC | PRN
  Administered 2024-04-20: 80 mL via INTRAVENOUS

## 2024-04-21 LAB — BASIC METABOLIC PANEL WITH GFR
BUN/Creatinine Ratio: 19 (ref 10–24)
BUN: 30 mg/dL — AB (ref 8–27)
CO2: 20 mmol/L (ref 20–29)
Calcium: 9.2 mg/dL (ref 8.6–10.2)
Chloride: 104 mmol/L (ref 96–106)
Creatinine, Ser: 1.62 mg/dL — AB (ref 0.76–1.27)
Glucose: 111 mg/dL — AB (ref 70–99)
Potassium: 4.2 mmol/L (ref 3.5–5.2)
Sodium: 141 mmol/L (ref 134–144)
eGFR: 43 mL/min/1.73 — AB (ref >59)

## 2024-04-21 LAB — CBC
Hematocrit: 41.3 % (ref 37.5–51.0)
Hemoglobin: 13 g/dL (ref 13.0–17.7)
MCH: 29.3 pg (ref 26.6–33.0)
MCHC: 31.5 g/dL (ref 31.5–35.7)
MCV: 93 fL (ref 79–97)
Platelets: 188 x10E3/uL (ref 150–450)
RBC: 4.44 x10E6/uL (ref 4.14–5.80)
RDW: 15.8 % — ABNORMAL HIGH (ref 11.6–15.4)
WBC: 6 x10E3/uL (ref 3.4–10.8)

## 2024-04-23 ENCOUNTER — Ambulatory Visit: Payer: Self-pay

## 2024-04-28 ENCOUNTER — Telehealth: Payer: Self-pay

## 2024-04-28 NOTE — Telephone Encounter (Signed)
 Fairy Sheen: Chicken wing anatomy Max 23/ AVG 22/ Depth 14 Likely use a 27mm device. TruSteer sheath to help gain usable depth Inf/Mid-ant TSP RAO 21 CAU 13

## 2024-04-29 ENCOUNTER — Other Ambulatory Visit: Payer: Self-pay | Admitting: *Deleted

## 2024-04-29 NOTE — Patient Instructions (Signed)
 Visit Information  Thank you for taking time to visit with me today. Please don't hesitate to contact me if I can be of assistance to you before our next scheduled appointment.  Your next care management appointment is by telephone on 05-28-2024 at 2:45 pm  Telephone follow-up in 1 month  Please call the care guide team at [phone] if you need to cancel, schedule, or reschedule an appointment.   Please call the Suicide and Crisis Lifeline: 988 call the [place]  National Suicide Prevention Lifeline: (203) 237-2219 or TTY: 904 319 6471 TTY 419-473-8050) to talk to a trained counselor call 1-800-273-TALK (toll free, 24 hour hotline) call the [facility]: [phone] call 911 if you are experiencing a Mental Health or Behavioral Health Crisis or need someone to talk to.  Rosina Forte, BSN RN [facility], [facility] RN Care Manager Direct Dial: [phone]  Fax: [phone]

## 2024-04-29 NOTE — Patient Outreach (Signed)
 Complex Care Management   Visit Note  04/29/2024  Name:  Connor Bryant MRN: [id number] DOB: Oct 04, 1945  Situation: Referral received for Complex Care Management related to Heart Failure and Atrial Fibrillation I obtained verbal consent from Patient.  Visit completed with patient  on the phone  Background:   Past Medical History:  Diagnosis Date   Acute CVA (cerebrovascular accident) (HCC) 05/01/2020   Acute respiratory failure with hypoxia (HCC) 05/04/2020   Arthritis    Atrial fibrillation (HCC)    DM type 2 (diabetes mellitus, type 2) (HCC)    no meds   Endotracheally intubated    Gallstone    Heart murmur    was told one time he had a murmur, not since 30 years ago   HTN (hypertension)    Hyperlipidemia    Myocardial infarct, old    2021   Personal history of colonic polyp-adenoma 11/24/2013   Renal insufficiency    mild   Thoracic spine fracture (HCC)     Assessment: Patient Reported Symptoms:  Cognitive Cognitive Status: No symptoms reported Cognitive/Intellectual Conditions Management [RPT]: None reported or documented in medical history or problem list   Health Maintenance Behaviors: Annual physical exam Healing Pattern: Average Health Facilitated by: Rest  Neurological Neurological Review of Symptoms: No symptoms reported Neurological Management Strategies: Routine screening Neurological Self-Management Outcome: 4 (good)  HEENT HEENT Symptoms Reported: No symptoms reported HEENT Management Strategies: Routine screening HEENT Self-Management Outcome: 4 (good)    Cardiovascular Cardiovascular Symptoms Reported: No symptoms reported Does patient have uncontrolled Hypertension?: Yes Is patient checking Blood Pressure at home?: No Cardiovascular Management Strategies: Routine screening, Medication therapy Weight: 179 lb (81.2 kg) (patient reported) Cardiovascular Self-Management Outcome: 4 (good)  Respiratory Respiratory Symptoms Reported: No symptoms  reported Respiratory Management Strategies: Routine screening Respiratory Self-Management Outcome: 4 (good)  Endocrine Endocrine Symptoms Reported: No symptoms reported Is patient diabetic?: Yes Is patient checking blood sugars at home?: No Endocrine Self-Management Outcome: 4 (good)  Gastrointestinal Gastrointestinal Symptoms Reported: No symptoms reported Gastrointestinal Self-Management Outcome: 4 (good) Nutrition Risk Screen (CP): No indicators present  Genitourinary Genitourinary Symptoms Reported: No symptoms reported Genitourinary Self-Management Outcome: 4 (good)  Integumentary Integumentary Symptoms Reported: Bruising Additional Integumentary Details: Right leg venous ulcer - nearly healed Skin Management Strategies: Routine screening Skin Self-Management Outcome: 4 (good)  Musculoskeletal Musculoskelatal Symptoms Reviewed: No symptoms reported Musculoskeletal Self-Management Outcome: 4 (good) Falls in the past year?: Yes Number of falls in past year: 1 or less Was there an injury with Fall?: No Fall Risk Category Calculator: 1 Patient Fall Risk Level: Low Fall Risk Patient at Risk for Falls Due to: History of fall(s) Fall risk Follow up: Falls evaluation completed, Education provided  Psychosocial Psychosocial Symptoms Reported: No symptoms reported Behavioral Health Self-Management Outcome: 4 (good) Major Change/Loss/Stressor/Fears (CP): Denies Quality of Family Relationships: helpful, involved, supportive Do you feel physically threatened by others?: No      04/29/2024    2:57 PM  Depression screen PHQ 2/9  Decreased Interest 0  Down, Depressed, Hopeless 0  PHQ - 2 Score 0    There were no vitals filed for this visit.  Medications Reviewed Today     Reviewed by Bertrum Rosina [name], RN (Registered Nurse) on 04/29/24 at 1452  Med List Status: <None>   Medication Order Taking? Sig Documenting Provider Last Dose Status Informant  Accu-Chek Softclix Lancets lancets  [id number] Yes SMARTSIG:Topical 1-3 Times Daily [provider]  Active   allopurinol  (ZYLOPRIM ) 100 MG tablet [id number]  Yes TAKE 1 TABLET 2 TO 3 TIMES A DAY FOR GOUT **NEEDS TO BE SEEN BEFORE NEXT REFILLTHORA Zollie Lowers, MD  Active   amiodarone  (PACERONE ) 200 MG tablet [id number] Yes Take 1 tablet (200 mg total) by mouth daily. Rolan Ezra RAMAN, MD  Active   atorvastatin  (LIPITOR ) 80 MG tablet [id number] Yes Take 1 tablet (80 mg total) by mouth daily. Zollie Lowers, MD  Active            Med Note DRENA, ANGELICA G   Sat Dec 14, 2023  2:58 PM)    Blood Glucose Monitoring Suppl (ACCU-CHEK GUIDE) w/Device PRESSLEY [id number] Yes CHECK BLOOD SUGAR UP TO THREE TIMES DAILY AS DIRECTED [provider]  Active   Blood Glucose Monitoring Suppl DEVI [id number] Yes 1 each by Does not apply route in the morning, at noon, and at bedtime. May substitute to any manufacturer covered by patient's insurance. Zollie Lowers, MD  Active Self  Calcium  Carb-Cholecalciferol (CALCIUM  500 + D PO) [id number] Yes Take 1 tablet by mouth daily. [provider]  Active Self  carbidopa -levodopa  (SINEMET ) 10-100 MG tablet [id number] Yes Take 1 tablet by mouth 3 (three) times daily. For parkinsonism Zollie Lowers, MD  Active   carvedilol  (COREG ) 6.25 MG tablet [id number] Yes Take 1 tablet (6.25 mg total) by mouth 2 (two) times daily. Rolan Ezra RAMAN, MD  Active   clopidogrel  (PLAVIX ) 75 MG tablet [id number] Yes Take 1 tablet (75 mg total) by mouth daily. Rolan Ezra RAMAN, MD  Active   dapagliflozin  propanediol (FARXIGA ) 10 MG TABS tablet [id number] Yes Take 1 tablet (10 mg total) by mouth daily. Glena Raisin [place], [place]  Active   ferrous sulfate  325 (65 FE) MG tablet [id number] Yes Take 1 tablet (325 mg total) by mouth daily. Lee, [place], NP  Active   isosorbide -hydrALAZINE  (BIDIL ) 20-37.5 MG tablet [id number] Yes Take 1.5 tablets by mouth 3 (three) times daily. [place], [place]  Active   pantoprazole  (PROTONIX )  40 MG tablet [id number] Yes TAKE ONE TABLET TWICE DAILY Zollie Lowers, MD  Active   spironolactone  (ALDACTONE ) 25 MG tablet [id number] Yes Take 0.5 tablets (12.5 mg total) by mouth daily. Rolan Ezra RAMAN, MD  Active   sulfamethoxazole -trimethoprim  (BACTRIM  DS) 800-160 MG tablet [id number]  Take 1 tablet by mouth 2 (two) times daily. Until gone, for infection  Patient not taking: Reported on 04/29/2024   Zollie Lowers, MD  Consider Medication Status and Discontinue             Recommendation:   Continue Current Plan of Care  Follow Up Plan:   Telephone follow-up in 1 month  Rosina Forte, BSN RN [facility], [facility] RN Care Manager Direct Dial: [phone]  Fax: [phone]

## 2024-05-04 ENCOUNTER — Telehealth (HOSPITAL_COMMUNITY): Payer: Self-pay

## 2024-05-04 NOTE — Telephone Encounter (Signed)
 Per Dr. Kennyth patient was to start Eliquis  and stop Plavix . He will need to make this change now and we will arrange for a TEE on day of ablation.  Left message for patient to call back

## 2024-05-04 NOTE — Telephone Encounter (Signed)
 Spoke with patient to discuss upcoming procedure.   Patient states he has only been taking Plavix  (PCI 09/16/23) and stopped Eliquis  several months ago because of bleeding and was not aware he was supposed to restart medication. States he was told he would only have to take prior to [place] device.

## 2024-05-05 MED ORDER — APIXABAN 5 MG PO TABS: 5.0000 mg | ORAL_TABLET | Freq: Two times a day (BID) | ORAL | 6 refills | Status: DC

## 2024-05-05 NOTE — Telephone Encounter (Signed)
 Informed patient of provider recommendations and he is agreeable to start Eliquis  and stop Plavix . Patient reports he already took Plavix  this morning.  Advised patient to start Eliquis  with the evening dose on today and reminded not to take any more Plavix . Confirmed dosage with Dr. Kennyth, patient should take Eliquis  5 mg twice daily. Will send new Rx to pharmacy. Patient verbalized understanding.   Reviewed additional instructions for procedure:  CT: completed. TEE day of procedure Labs: completed.   Any recent signs of acute illness or been started on antibiotics? No Any new medications started? No Any medications to hold? Hold Farxiga  for 3 days prior to the procedure. Last dose on August 14.  Advised patient to continue taking ANTICOAGULANT: Eliquis  (Apixaban ) twice daily without missing any doses. Medication instructions:  On the morning of your procedure take only Sinemet  with a small sip of water. Do not take any other medications or the procedure may be rescheduled. Nothing to eat or drink after midnight prior to your procedure.  Confirmed patient is scheduled for Atrial Fibrillation Ablation on Monday, August 18 with Dr. Sidra Kennyth. Instructed patient to arrive at the Main Entrance A at [facility]: [address] [place], [place] [address] and check in at Admitting at 8:00 AM.  Advised of plan to go home the same day and will only stay overnight if medically necessary. You MUST have a responsible adult to drive you home and MUST be with you the first 24 hours after you arrive home or your procedure could be cancelled.  Patient verbalized understanding to all instructions provided and agreed to proceed with procedure.

## 2024-05-08 ENCOUNTER — Telehealth: Payer: Self-pay | Admitting: Cardiology

## 2024-05-08 NOTE — Telephone Encounter (Signed)
 Pt c/o BP issue: STAT if pt c/o blurred vision, one-sided weakness or slurred speech  1. What are your last 5 BP readings? 170/68 HR 46; 135/59 HR 43  2. Are you having any other symptoms (ex. Dizziness, headache, blurred vision, passed out)? tireness  3. What is your BP issue? Patient has concern of his bp being so low being that he has surgery soon.

## 2024-05-08 NOTE — Telephone Encounter (Signed)
 Attempted to contact pt regarding his concerns.  Call was sent to voicemail.  Left message to call back to discuss his concerns.

## 2024-05-10 NOTE — Anesthesia Preprocedure Evaluation (Signed)
 Anesthesia Evaluation  Patient identified by MRN, date of birth, ID band Patient awake    Reviewed: Allergy & Precautions, NPO status , Patient's Chart, lab work & pertinent test results, reviewed documented beta blocker date and time   Airway Mallampati: III  TM Distance: >3 FB Neck ROM: Full    Dental no notable dental hx. (+) Teeth Intact, Implants, Dental Advisory Given   Pulmonary former smoker, PE (2021)   Pulmonary exam normal breath sounds clear to auscultation       Cardiovascular hypertension, Pt. on medications and Pt. on home beta blockers + CAD, + Past MI (2021) and +CHF (HFrEF)  Normal cardiovascular exam+ Valvular Problems/Murmurs (Mod AS) AS  Rhythm:Irregular Rate:Abnormal  11/2023 Echo  1. Left ventricular ejection fraction, by estimation, is 55 to 60%. The  left ventricle has normal function. Left ventricular endocardial border  not optimally defined to evaluate regional wall motion. There is mild left  ventricular hypertrophy. Left  ventricular diastolic parameters are consistent with Grade I diastolic  dysfunction (impaired relaxation). Elevated left ventricular end-diastolic  pressure.   2. Right ventricular systolic function is normal. The right ventricular  size is normal. There is normal pulmonary artery systolic pressure.   3. Left atrial size was moderately dilated.   4. The mitral valve is abnormal. Trivial mitral valve regurgitation. No  evidence of mitral stenosis. Severe mitral annular calcification.   5. The aortic valve is tricuspid. There is moderate calcification of the  aortic valve. There is moderate thickening of the aortic valve. Aortic  valve regurgitation is not visualized. Moderate aortic valve stenosis.  Aortic valve area, by VTI measures  1.12 cm. Aortic valve mean gradient measures 17.0 mmHg. Aortic valve Vmax  measures 2.90 m/s.   6. The inferior vena cava is normal in size with  greater than 50%  respiratory variability, suggesting right atrial pressure of 3 mmHg.   Comparison(s): Changes from prior study are noted. LVEF improved from  30-35% to 55-60%. Stable moderate AS.     Neuro/Psych  Neuromuscular disease (parkinsons dz) CVA (peripheral vision in left eye), Residual Symptoms    GI/Hepatic ,GERD  Medicated and Controlled,,  Endo/Other  diabetes    Renal/GU Renal InsufficiencyRenal diseaseOn plavix   Lab Results      Component                Value               Date                         K                        4.2                 04/20/2024                CO2                      20                  04/20/2024                BUN                      30 (H)              04/20/2024  CREATININE               1.62 (H)            04/20/2024                EGFR                     43 (L)              04/20/2024                CALCIUM                   9.2                 04/20/2024                       Musculoskeletal  (+) Arthritis ,    Abdominal   Peds  Hematology Lab Results      Component                Value               Date                      WBC                      6.0                 04/20/2024                HGB                      13.0                04/20/2024                HCT                      41.3                04/20/2024                MCV                      93                  04/20/2024                PLT                      188                 04/20/2024              Anesthesia Other Findings NKDA  Reproductive/Obstetrics                              Anesthesia Physical Anesthesia Plan  ASA: 3  Anesthesia Plan: General   Post-op Pain Management: Minimal or no pain anticipated   Induction: Intravenous  PONV Risk Score and Plan: 3 and Treatment may vary due to age or medical condition and Ondansetron   Airway Management Planned: Oral ETT  Additional Equipment:  None  Intra-op Plan:   Post-operative Plan: Extubation in OR  Informed Consent: I have reviewed the patients History and Physical, chart, labs and discussed the procedure including the risks, benefits and alternatives for the proposed anesthesia with the patient or authorized representative who has indicated his/her understanding and acceptance.     Dental advisory given  Plan Discussed with: CRNA and Surgeon  Anesthesia Plan Comments:          Anesthesia Quick Evaluation

## 2024-05-11 ENCOUNTER — Ambulatory Visit (HOSPITAL_COMMUNITY)
Admission: RE | Admit: 2024-05-11 | Discharge: 2024-05-11 | Disposition: A | Discharge status: Home / Self Care | Attending: Cardiology | Admitting: Cardiology

## 2024-05-11 ENCOUNTER — Encounter (HOSPITAL_COMMUNITY)
Admission: RE | Disposition: A | Discharge status: Home / Self Care | Payer: Self-pay | Source: Home / Self Care | Attending: Cardiology

## 2024-05-11 ENCOUNTER — Ambulatory Visit (HOSPITAL_BASED_OUTPATIENT_CLINIC_OR_DEPARTMENT_OTHER): Admitting: Certified Registered"

## 2024-05-11 ENCOUNTER — Ambulatory Visit (HOSPITAL_COMMUNITY): Admitting: Certified Registered"

## 2024-05-11 ENCOUNTER — Other Ambulatory Visit: Payer: Self-pay

## 2024-05-11 DIAGNOSIS — I2510 Atherosclerotic heart disease of native coronary artery without angina pectoris: Secondary | ICD-10-CM | POA: Insufficient documentation

## 2024-05-11 DIAGNOSIS — Z86711 Personal history of pulmonary embolism: Secondary | ICD-10-CM | POA: Insufficient documentation

## 2024-05-11 DIAGNOSIS — I4819 Other persistent atrial fibrillation: Secondary | ICD-10-CM

## 2024-05-11 DIAGNOSIS — H538 Other visual disturbances: Secondary | ICD-10-CM | POA: Diagnosis not present

## 2024-05-11 DIAGNOSIS — I483 Typical atrial flutter: Secondary | ICD-10-CM

## 2024-05-11 DIAGNOSIS — I4891 Unspecified atrial fibrillation: Secondary | ICD-10-CM | POA: Diagnosis not present

## 2024-05-11 DIAGNOSIS — I69398 Other sequelae of cerebral infarction: Secondary | ICD-10-CM | POA: Diagnosis not present

## 2024-05-11 DIAGNOSIS — Z7901 Long term (current) use of anticoagulants: Secondary | ICD-10-CM | POA: Insufficient documentation

## 2024-05-11 DIAGNOSIS — Z87891 Personal history of nicotine dependence: Secondary | ICD-10-CM | POA: Diagnosis not present

## 2024-05-11 DIAGNOSIS — I10 Essential (primary) hypertension: Secondary | ICD-10-CM | POA: Diagnosis not present

## 2024-05-11 DIAGNOSIS — E119 Type 2 diabetes mellitus without complications: Secondary | ICD-10-CM | POA: Insufficient documentation

## 2024-05-11 DIAGNOSIS — Z955 Presence of coronary angioplasty implant and graft: Secondary | ICD-10-CM | POA: Insufficient documentation

## 2024-05-11 DIAGNOSIS — I110 Hypertensive heart disease with heart failure: Secondary | ICD-10-CM

## 2024-05-11 DIAGNOSIS — I5023 Acute on chronic systolic (congestive) heart failure: Secondary | ICD-10-CM | POA: Diagnosis not present

## 2024-05-11 DIAGNOSIS — I5042 Chronic combined systolic (congestive) and diastolic (congestive) heart failure: Secondary | ICD-10-CM | POA: Insufficient documentation

## 2024-05-11 DIAGNOSIS — I3139 Other pericardial effusion (noninflammatory): Secondary | ICD-10-CM | POA: Insufficient documentation

## 2024-05-11 DIAGNOSIS — D6869 Other thrombophilia: Secondary | ICD-10-CM | POA: Diagnosis not present

## 2024-05-11 DIAGNOSIS — Z7902 Long term (current) use of antithrombotics/antiplatelets: Secondary | ICD-10-CM | POA: Diagnosis not present

## 2024-05-11 DIAGNOSIS — R001 Bradycardia, unspecified: Secondary | ICD-10-CM | POA: Insufficient documentation

## 2024-05-11 DIAGNOSIS — Z01818 Encounter for other preprocedural examination: Secondary | ICD-10-CM

## 2024-05-11 DIAGNOSIS — K219 Gastro-esophageal reflux disease without esophagitis: Secondary | ICD-10-CM | POA: Diagnosis not present

## 2024-05-11 DIAGNOSIS — Z8673 Personal history of transient ischemic attack (TIA), and cerebral infarction without residual deficits: Secondary | ICD-10-CM | POA: Insufficient documentation

## 2024-05-11 HISTORY — PX: ATRIAL FIBRILLATION ABLATION: EP1191

## 2024-05-11 LAB — BASIC METABOLIC PANEL WITH GFR
Anion gap: 11 (ref 5–15)
BUN: 42 mg/dL — ABNORMAL HIGH (ref 8–23)
CO2: 18 mmol/L — ABNORMAL LOW (ref 22–32)
Calcium: 8.9 mg/dL (ref 8.9–10.3)
Chloride: 109 mmol/L (ref 98–111)
Creatinine, Ser: 1.97 mg/dL — ABNORMAL HIGH (ref 0.61–1.24)
GFR, Estimated: 34 mL/min — ABNORMAL LOW (ref >60)
Glucose, Bld: 175 mg/dL — ABNORMAL HIGH (ref 70–99)
Potassium: 4.4 mmol/L (ref 3.5–5.1)
Sodium: 138 mmol/L (ref 135–145)

## 2024-05-11 LAB — GLUCOSE, CAPILLARY
Glucose-Capillary: 171 mg/dL — ABNORMAL HIGH (ref 70–99)
Glucose-Capillary: 180 mg/dL — ABNORMAL HIGH (ref 70–99)

## 2024-05-11 LAB — POCT ACTIVATED CLOTTING TIME: Activated Clotting Time: 273 s

## 2024-05-11 MED ORDER — PROTAMINE SULFATE 10 MG/ML IV SOLN
INTRAVENOUS | Status: DC | PRN
  Administered 2024-05-11: 35 mg via INTRAVENOUS

## 2024-05-11 MED ORDER — ATROPINE SULFATE 1 MG/10ML IJ SOSY
PREFILLED_SYRINGE | INTRAMUSCULAR | Status: AC
  Filled 2024-05-11: qty 10

## 2024-05-11 MED ORDER — SODIUM CHLORIDE 0.9 % IV SOLN
250.0000 mL | INTRAVENOUS | Status: DC | PRN
Start: 2024-05-11 — End: 2024-05-11

## 2024-05-11 MED ORDER — DEXAMETHASONE SODIUM PHOSPHATE 10 MG/ML IJ SOLN
INTRAMUSCULAR | Status: DC | PRN
  Administered 2024-05-11: 5 mg via INTRAVENOUS

## 2024-05-11 MED ORDER — FENTANYL CITRATE (PF) 100 MCG/2ML IJ SOLN
INTRAMUSCULAR | Status: AC
Start: 2024-05-11 — End: 2024-05-11
  Filled 2024-05-11: qty 2

## 2024-05-11 MED ORDER — HEPARIN SODIUM (PORCINE) 1000 UNIT/ML IJ SOLN
INTRAMUSCULAR | Status: AC
  Filled 2024-05-11: qty 20

## 2024-05-11 MED ORDER — ACETAMINOPHEN 325 MG PO TABS: 650.0000 mg | ORAL_TABLET | ORAL | Status: DC | PRN

## 2024-05-11 MED ORDER — HEPARIN SODIUM (PORCINE) 1000 UNIT/ML IJ SOLN
INTRAMUSCULAR | Status: DC | PRN
Start: 2024-05-11 — End: 2024-05-11
  Administered 2024-05-11: 13000 [IU] via INTRAVENOUS
  Administered 2024-05-11: 4000 [IU] via INTRAVENOUS

## 2024-05-11 MED ORDER — HEPARIN (PORCINE) IN NACL 1000-0.9 UT/500ML-% IV SOLN
INTRAVENOUS | Status: DC | PRN
  Administered 2024-05-11 (×2): 500 mL

## 2024-05-11 MED ORDER — FENTANYL CITRATE (PF) 250 MCG/5ML IJ SOLN
INTRAMUSCULAR | Status: DC | PRN
  Administered 2024-05-11: 50 ug via INTRAVENOUS
  Administered 2024-05-11: 25 ug via INTRAVENOUS

## 2024-05-11 MED ORDER — NITROGLYCERIN 1 MG/10 ML FOR IR/CATH LAB
INTRA_ARTERIAL | Status: AC
  Filled 2024-05-11: qty 20

## 2024-05-11 MED ORDER — ATROPINE SULFATE 1 MG/ML IV SOLN
INTRAVENOUS | Status: DC | PRN
  Administered 2024-05-11: 1 mg via INTRAVENOUS

## 2024-05-11 MED ORDER — SUGAMMADEX SODIUM 200 MG/2ML IV SOLN
INTRAVENOUS | Status: DC | PRN
  Administered 2024-05-11: 200 mg via INTRAVENOUS

## 2024-05-11 MED ORDER — ONDANSETRON HCL 4 MG/2ML IJ SOLN: 4.0000 mg | Freq: Four times a day (QID) | INTRAMUSCULAR | Status: DC | PRN

## 2024-05-11 MED ORDER — ONDANSETRON HCL 4 MG/2ML IJ SOLN
INTRAMUSCULAR | Status: DC | PRN
  Administered 2024-05-11: 4 mg via INTRAVENOUS

## 2024-05-11 MED ORDER — SODIUM CHLORIDE 0.9% FLUSH: 3.0000 mL | INTRAVENOUS | Status: DC | PRN

## 2024-05-11 MED ORDER — NITROGLYCERIN 1 MG/10 ML FOR IR/CATH LAB
INTRA_ARTERIAL | Status: DC | PRN
  Administered 2024-05-11: 20 mL via INTRAVENOUS

## 2024-05-11 MED ORDER — SODIUM CHLORIDE 0.9% FLUSH: 3.0000 mL | Freq: Two times a day (BID) | INTRAVENOUS | Status: DC

## 2024-05-11 MED ORDER — PROPOFOL 10 MG/ML IV BOLUS
INTRAVENOUS | Status: DC | PRN
  Administered 2024-05-11: 100 mg via INTRAVENOUS

## 2024-05-11 MED ORDER — ROCURONIUM BROMIDE 10 MG/ML (PF) SYRINGE
PREFILLED_SYRINGE | INTRAVENOUS | Status: DC | PRN
  Administered 2024-05-11: 10 mg via INTRAVENOUS
  Administered 2024-05-11: 60 mg via INTRAVENOUS
  Administered 2024-05-11: 10 mg via INTRAVENOUS

## 2024-05-11 MED ORDER — EPHEDRINE SULFATE-NACL 50-0.9 MG/10ML-% IV SOSY
PREFILLED_SYRINGE | INTRAVENOUS | Status: DC | PRN
Start: 2024-05-11 — End: 2024-05-11
  Administered 2024-05-11 (×3): 5 mg via INTRAVENOUS

## 2024-05-11 MED ORDER — LIDOCAINE 2% (20 MG/ML) 5 ML SYRINGE
INTRAMUSCULAR | Status: DC | PRN
  Administered 2024-05-11: 60 mg via INTRAVENOUS

## 2024-05-11 MED ORDER — APIXABAN 5 MG PO TABS
5.0000 mg | ORAL_TABLET | Freq: Once | ORAL | Status: AC
  Administered 2024-05-11: 5 mg via ORAL
  Filled 2024-05-11: qty 1

## 2024-05-11 MED ORDER — ISOSORB DINITRATE-HYDRALAZINE 20-37.5 MG PO TABS: 2.0000 | ORAL_TABLET | Freq: Three times a day (TID) | ORAL | 11 refills | Status: DC

## 2024-05-11 MED ORDER — SODIUM CHLORIDE 0.9 % IV SOLN: INTRAVENOUS | Status: DC

## 2024-05-11 MED ORDER — PHENYLEPHRINE HCL-NACL 20-0.9 MG/250ML-% IV SOLN
INTRAVENOUS | Status: DC | PRN
  Administered 2024-05-11: 200 ug via INTRAVENOUS
  Administered 2024-05-11: 30 ug/min via INTRAVENOUS

## 2024-05-11 NOTE — Anesthesia Postprocedure Evaluation (Signed)
 Anesthesia Post Note  Patient: REGIE BUNNER  Procedure(s) Performed: ATRIAL FIBRILLATION ABLATION     Patient location during evaluation: PACU Anesthesia Type: General Level of consciousness: awake and alert, oriented and patient cooperative Pain management: pain level controlled Vital Signs Assessment: post-procedure vital signs reviewed and stable Respiratory status: spontaneous breathing, nonlabored ventilation and respiratory function stable Cardiovascular status: blood pressure returned to baseline and stable Postop Assessment: no apparent nausea or vomiting Anesthetic complications: no   There were no known notable events for this encounter.  Last Vitals:  Vitals:   05/11/24 1400 05/11/24 1430  BP: (!) 145/55 (!) 149/59  Pulse: (!) 39 (!) 39  Resp: 17 15  Temp:    SpO2: (!) 86% 92%    Last Pain:  Vitals:   05/11/24 1310  TempSrc:   PainSc: 0-No pain   Pain Goal: Patients Stated Pain Goal: 4 (05/11/24 1310)                 Almarie CHRISTELLA Marchi

## 2024-05-11 NOTE — Progress Notes (Signed)
 Patient ambulated to BR, both groins are unremarkable.

## 2024-05-11 NOTE — Discharge Instructions (Addendum)
 Stop amiodarone  and carvedilol . Increase Bidil  to 2 tablets three times daily.  Cardiac Ablation, Care After  This sheet gives you information about how to care for yourself after your procedure. Your health care provider may also give you more specific instructions. If you have problems or questions, contact your health care provider. What can I expect after the procedure? After the procedure, it is common to have: Bruising around your puncture site. Tenderness around your puncture site. Skipped heartbeats. If you had an atrial fibrillation ablation, you may have atrial fibrillation during the first several months after your procedure.  Tiredness (fatigue).  Follow these instructions at home: Puncture site care  Follow instructions from your health care provider about how to take care of your puncture site. Make sure you: If present, leave stitches (sutures), skin glue, or adhesive strips in place. These skin closures may need to stay in place for up to 2 weeks. If adhesive strip edges start to loosen and curl up, you may trim the loose edges. Do not remove adhesive strips completely unless your health care provider tells you to do that. If a large square bandage is present, this may be removed 24 hours after surgery.  Check your puncture site every day for signs of infection. Check for: Redness, swelling, or pain. Fluid or blood. If your puncture site starts to bleed, lie down on your back, apply firm pressure to the area, and contact your health care provider. Warmth. Pus or a bad smell. A pea or marble sized lump/knot at the site is normal and can take up to three months to resolve.  Driving Do not drive for at least 4 days after your procedure or however long your health care provider recommends. (Do not resume driving if you have previously been instructed not to drive for other health reasons.) Do not drive or use heavy machinery while taking prescription pain  medicine. Activity Avoid activities that take a lot of effort for at least 7 days after your procedure. Do not lift anything that is heavier than 5 lb (4.5 kg) for one week.  No sexual activity for 1 week.  Return to your normal activities as told by your health care provider. Ask your health care provider what activities are safe for you. General instructions Take over-the-counter and prescription medicines only as told by your health care provider. Do not use any products that contain nicotine or tobacco, such as cigarettes and e-cigarettes. If you need help quitting, ask your health care provider. You may shower after 24 hours, but Do not take baths, swim, or use a hot tub for 1 week.  Do not drink alcohol for 24 hours after your procedure. Keep all follow-up visits as told by your health care provider. This is important. Contact a health care provider if: You have redness, mild swelling, or pain around your puncture site. You have fluid or blood coming from your puncture site that stops after applying firm pressure to the area. Your puncture site feels warm to the touch. You have pus or a bad smell coming from your puncture site. You have a fever. You have chest pain or discomfort that spreads to your neck, jaw, or arm. You have chest pain that is worse with lying on your back or taking a deep breath. You are sweating a lot. You feel nauseous. You have a fast or irregular heartbeat. You have shortness of breath. You are dizzy or light-headed and feel the need to lie down. You have  pain or numbness in the arm or leg closest to your puncture site. Get help right away if: Your puncture site suddenly swells. Your puncture site is bleeding and the bleeding does not stop after applying firm pressure to the area. These symptoms may represent a serious problem that is an emergency. Do not wait to see if the symptoms will go away. Get medical help right away. Call your local emergency  services (911 in the [place] Do not drive yourself to the hospital. Summary After the procedure, it is normal to have bruising and tenderness at the puncture site in your groin, neck, or forearm. Check your puncture site every day for signs of infection. Get help right away if your puncture site is bleeding and the bleeding does not stop after applying firm pressure to the area. This is a medical emergency. This information is not intended to replace advice given to you by your health care provider. Make sure you discuss any questions you have with your health care provider.

## 2024-05-11 NOTE — Anesthesia Procedure Notes (Signed)
 Procedure Name: Intubation Date/Time: 05/11/2024 9:48 AM  Performed by: Delores Dus, CRNAPre-anesthesia Checklist: Patient identified, Emergency Drugs available, Suction available and Patient being monitored Patient Re-evaluated:Patient Re-evaluated prior to induction Oxygen Delivery Method: Circle system utilized Preoxygenation: Pre-oxygenation with 100% oxygen Induction Type: IV induction Ventilation: Mask ventilation without difficulty Laryngoscope Size: Miller and 2 Grade View: Grade II Tube type: Oral Tube size: 7.0 mm Number of attempts: 2 Airway Equipment and Method: Stylet and Oral airway Placement Confirmation: ETT inserted through vocal cords under direct vision, positive ETCO2 and breath sounds checked- equal and bilateral Secured at: 22 cm Tube secured with: Tape Dental Injury: Teeth and Oropharynx as per pre-operative assessment

## 2024-05-11 NOTE — Telephone Encounter (Signed)
 Patient is currently at the hospital for his procedure.

## 2024-05-11 NOTE — H&P (Signed)
 Electrophysiology Note:   Date:  05/11/24  ID:  Connor Bryant, DOB 1946-05-25, MRN [id number]   Primary Cardiologist: Alvan Carrier, MD Electrophysiologist: Fonda Kitty, MD       History of Present Illness:   Connor Bryant is a 78 y.o. male with h/o CVA, multivessel CAD s/p proximal LAD and RCA stenting, persistent atrial fibrillation, GI bleeding and epistaxis on Plavix  and Eliquis , systolic heart failure with recovered LVEF who is being seen today for evaluation for Watchman device and catheter ablation.   Discussed the use of AI scribe software for clinical note transcription with the patient, who gave verbal consent to proceed.   History of Present Illness He has a history of atrial fibrillation and heart failure, with fluctuating heart pumping function, particularly during episodes of atrial fibrillation. In November, atrial fibrillation was detected on monitors without symptoms, although he previously experienced palpitations, especially at night. No increased fatigue or dyspnea during these episodes. He is currently on amiodarone  to maintain normal rhythm. He has experienced bleeding issues with anticoagulation therapy when on both Eliquis  and Plavix , including gingival bleeding, epistaxis and GI bleeding. Currently, he is on Plavix  for his heart stents. He reports issues with blood pressure management. Recently prescribed an increased dose of Isosorbide  (a tablet and a half three times a day), he experienced dizziness and a blood pressure reading of 112/60 mmHg, which he felt was too low. Prefers his blood pressure to be around 120-130 mmHg; he states that he developed symptoms of lower than this.. After reducing the dose to one tablet, his blood pressure was 152 mmHg before taking the medication and 199 mmHg after. He acknowledges having 'white coat syndrome' and notes that his blood pressure readings at home are usually within normal range.   Interval: Patient presents for scheduled  ablation. Has noticed some low heart rates at home. Otherwise doing relatively well.   Review of systems complete and found to be negative unless listed in HPI.    EP Information / Studies Reviewed:     EKG is not ordered today. EKG from 01/13/24 reviewed which showed sinus rhythm with PVC.        Echo 11/2023:   1. Left ventricular ejection fraction, by estimation, is 55 to 60%. The  left ventricle has normal function. Left ventricular endocardial border  not optimally defined to evaluate regional wall motion. There is mild left  ventricular hypertrophy. Left  ventricular diastolic parameters are consistent with Grade I diastolic  dysfunction (impaired relaxation). Elevated left ventricular end-diastolic  pressure.   2. Right ventricular systolic function is normal. The right ventricular  size is normal. There is normal pulmonary artery systolic pressure.   3. Left atrial size was moderately dilated.   4. The mitral valve is abnormal. Trivial mitral valve regurgitation. No  evidence of mitral stenosis. Severe mitral annular calcification.   5. The aortic valve is tricuspid. There is moderate calcification of the  aortic valve. There is moderate thickening of the aortic valve. Aortic  valve regurgitation is not visualized. Moderate aortic valve stenosis.  Aortic valve area, by VTI measures  1.12 cm. Aortic valve mean gradient measures 17.0 mmHg. Aortic valve Vmax  measures 2.90 m/s.   6. The inferior vena cava is normal in size with greater than 50%  respiratory variability, suggesting right atrial pressure of 3 mmHg.    LHC 08/2023:    Prox LAD to Mid LAD lesion is 99% stenosed.   A drug-eluting stent was successfully  placed using a SYNERGY XD 2.50X24.   Post intervention, there is a 0% residual stenosis.   Ost RCA lesion is 80% stenosed.   A drug-eluting stent was successfully placed using a SYNERGY XD 3.0X20.   A stent was successfully placed.   Post intervention, there is a  0% residual stenosis.   Post intervention, there is a 0% residual stenosis.   Recommend to resume Apixaban , at currently prescribed dose and frequency on 09/16/2023.   Recommend concurrent antiplatelet therapy of Clopidogrel  75mg  daily for 12 months.   Successful PCI of the mid LAD with IVUS guidance, orbital atherectomy and DES x 1 Successful PCI of the ostial RCA with IVUS guidance, Shockwave lithotripsy and DES x 1   Plan: may resume Eliquis  tonight. Given high bleeding risk would treat with Plavix  monotherapy for 12 months/no ASA   Risk Assessment/Calculations:     CHA2DS2-VASc Score = 8   This indicates a 10.8% annual risk of stroke. The patient's score is based upon: CHF History: 1 HTN History: 1 Diabetes History: 1 Stroke History: 2 Vascular Disease History: 1 Age Score: 2 Gender Score: 0          Physical Exam:    Vitals:   05/11/24 0825  BP: (!) 169/43  Pulse: (!) 40  Resp: 15  Temp: 98.1 F (36.7 C)  SpO2: 98%     GEN: Well nourished, well developed in no acute distress NECK: No JVD CARDIAC: Normal rate, regular rhythm RESPIRATORY:  Clear to auscultation without rales, wheezing or rhonchi  ABDOMEN: Soft, non-tender, non-distended EXTREMITIES:  No edema; No deformity    ASSESSMENT AND PLAN:    Patient has persistent atrial fibrillation which has been associated with clinical heart failure.  For this reason rhythm control strategy is superior.  He is currently on amiodarone .  Would like to avoid long-term risk associated with amiodarone  use.  For this reason, he would benefit from catheter ablation.  He also has history of bleeding while on both Eliquis  and Plavix .  For this reason, he would also benefit from [facility] device implant.  Ideally, procedures would be performed concomitantly.  This would require him to be on Eliquis  for 3 months after ablation.  Will need to discuss with interventional cardiology whether he can be on Eliquis  and aspirin  without  Plavix  and, if so, appropriate timing for that transition.  If procedures are unable to be performed concomitantly, then we could do ablation with 3 months of Eliquis  then Watchman device implant on dual antiplatelet therapy to avoid prolonged oral anticoagulation and prolonged interruption of Plavix  therapy.   Persistent Atrial Fibrillation (ICD10:  I48.19) -Discussed treatment options today for AF including antiarrhythmic drug therapy and ablation. Discussed risks, recovery and likelihood of success with each treatment strategy. Risk, benefits, and alternatives to EP study and ablation for afib were discussed. These risks include but are not limited to stroke, bleeding, vascular damage, tamponade, perforation, damage to the esophagus, lungs, phrenic nerve and other structures, pulmonary vein stenosis, worsening renal function, coronary vasospasm and death.  Discussed potential need for repeat ablation procedures and antiarrhythmic drugs after an initial ablation. The patient understands these risk and wishes to proceed today. -Stop amiodarone  and carvedilol .   Junctional bradycardia:  - Stop amiodarone  and carvedilol .    Secondary Hypercoagulable State (ICD10:  D68.69): CHA2DS2-VASc Score of 8.   CAD s/p pLAD and RCA PCI:  - Continue Eliquis  for at least 60 days after ablation.      Signed, Fonda  Kennyth, MD

## 2024-05-11 NOTE — Transfer of Care (Signed)
 Immediate Anesthesia Transfer of Care Note  Patient: Connor Bryant  Procedure(s) Performed: ATRIAL FIBRILLATION ABLATION  Patient Location: PACU  Anesthesia Type:General  Level of Consciousness: awake  Airway & Oxygen Therapy: Patient Spontanous Breathing and Patient connected to nasal cannula oxygen  Post-op Assessment: Report given to RN and Post -op Vital signs reviewed and stable  Post vital signs: Reviewed and stable  Last Vitals:  Vitals Value Taken Time  BP    Temp    Pulse 41 05/11/24 12:09  Resp 19 05/11/24 12:09  SpO2 93 % 05/11/24 12:09  Vitals shown include unfiled device data.  Last Pain:  Vitals:   05/11/24 0903  TempSrc:   PainSc: 0-No pain         Complications: There were no known notable events for this encounter.

## 2024-05-12 ENCOUNTER — Encounter (HOSPITAL_COMMUNITY): Payer: Self-pay | Admitting: Cardiology

## 2024-05-12 ENCOUNTER — Telehealth (HOSPITAL_COMMUNITY): Payer: Self-pay

## 2024-05-12 NOTE — Telephone Encounter (Signed)
 Spoke with patient to complete post procedure follow up call.  Patient reports no complications with groin sites.   Instructions reviewed with patient:  Remove large bandage at puncture site after 24 hours. It is normal to have bruising, tenderness, mild swelling, and a pea or marble sized lump/knot at the groin site which can take up to three months to resolve.  Get help right away if you notice sudden swelling at the puncture site.  Check your puncture site every day for signs of infection: fever, redness, swelling, pus drainage, warmth, foul odor or excessive pain. If this occurs, please call the office at [phone], to speak with the nurse. Get help right away if your puncture site is bleeding and the bleeding does not stop after applying firm pressure to the area.  You may continue to have skipped beats/ atrial fibrillation during the first several months after your procedure.  It is very important not to miss any doses of your blood thinner Eliquis .   You will follow up with the Afib clinic on 06/08/24 and follow up with the APP on 08/11/24.   Patient verbalized understanding to all instructions provided.

## 2024-05-14 MED FILL — Atropine Sulfate Soln Prefill Syr 1 MG/10ML (0.1 MG/ML): INTRAMUSCULAR | Qty: 10 | Status: AC

## 2024-05-14 MED FILL — Fentanyl Citrate Preservative Free (PF) Inj 100 MCG/2ML: INTRAMUSCULAR | Qty: 1.5 | Status: AC

## 2024-05-15 ENCOUNTER — Other Ambulatory Visit (HOSPITAL_COMMUNITY): Payer: Self-pay | Admitting: Cardiology

## 2024-05-18 ENCOUNTER — Telehealth: Payer: Self-pay | Admitting: Cardiology

## 2024-05-18 MED ORDER — ISOSORB DINITRATE-HYDRALAZINE 20-37.5 MG PO TABS: 2.0000 | ORAL_TABLET | Freq: Three times a day (TID) | ORAL | 2 refills | Status: DC

## 2024-05-18 NOTE — Telephone Encounter (Signed)
 Patient states his isosorbide -hydrALAZINE  (BIDIL ) was changed from taking 1.5 x3 daily, to now taking 2 x3 daily. Patient states he needs new prescription sent to the [facility] with the correct instructions - 2 x3 daily.   *STAT* If patient is at the pharmacy, call can be transferred to refill team.   1. Which medications need to be refilled? (please list name of each medication and dose if known) isosorbide -hydrALAZINE  (BIDIL ) 20-37.5 MG tablet  2. Which pharmacy/location (including street and city if local pharmacy) is medication to be sent to? [facility] [place], [place] - [address]  3. Do they need a 30 day or 90 day supply? 30 day

## 2024-05-18 NOTE — Telephone Encounter (Signed)
 New RX sent in.

## 2024-05-22 ENCOUNTER — Telehealth (HOSPITAL_COMMUNITY): Payer: Self-pay | Admitting: *Deleted

## 2024-05-22 NOTE — Telephone Encounter (Signed)
 Called to confirm/remind patient of their appointment at [facility] on 05/26/24.      Appointment:              [] Confirmed             [x] Left mess              [] No answer/No voice mail             [] Phone not in service   Patient reminded to bring all medications and/or complete list.   Confirmed patient has transportation. Gave directions, instructed to utilize valet parking.

## 2024-05-22 NOTE — Progress Notes (Signed)
 PCP: Renato Dorothey [name], NP Cardiology: Dr. Alvan HF Cardiology: Dr. Rolan  78 y.o. with history of CVA, persistent atrial fibrillation, and systolic CHF was referred by Dr. Alvan for evaluation of CHF.  Patient was admitted in 8/21 with CVA. HS-TnI elevated at 2500, echo showed EF 35% with apical akinesis.  This admission was complicated by PE, atrial fibrillation, AKI, anemia.  Cath was not done with anemia and AKI.  However, echo in 10/21 showed EF back up to 60-65% with mild-moderate AS.  It was suspected that the fall in EF was due to a stress cardiomyopathy given improvement. Echo in 7/23 showed EF 60-65%, moderate AS with mean gradient 28 mmHg, AVA 1.2 cm^2 by VTI.    In 11/24, he was admitted to [facility] with anemia and hypotension, melena was present with hgb 7.5.  Creatinine up to 2.39.  He had 2 units PRBCs, and since still hypotensive, he was transferred to [facility]. Echo showed EF 30-35%, diffuse hypokinesis, normal RV, PASP 60 mmHg, mild-moderate MR, moderate AS. He was was in atrial fibrillation during this admission.  Low BP limited GDMT titration.  Amiodarone  was begun for rate control of AF.  EGD was done showing erosive gastritis.  Eliquis  was restarted at discharge.   Patient had LHC/RHC in 12/24, this showed 80% ostial RCA stenosis and 99% proximal LAD stenosis; RHC showed elevated right and left heart filling pressures.  He was admitted for diuresis and to evaluate for CABG vs PCI. He was deemed high risk for CABG, so PCI was done with DES to proximal LAD and ostial RCA. In the hospital, he was converted back into NSR.   He was readmitted in 3/25 with influenza A PNA and also concern for GI bleeding with worsening hemoglobin.  He had severe epistaxis as well.  He was on Plavix  + Eliquis .  Unfortunately, Plavix  was stopped at this time and Eliquis  was continued.  Repeat echo in 3/25 showed EF up to 55-60% with normal RV, moderate aortic stenosis with mean gradient 17 mmHg and AVA 1.12  cm^2.   Follow up 4/25, volume overloaded and off most meds. Plavix  restarted, but kept off Eliquis  as risk of bleeding felt to be too great. Spiro, Lasix , and amio restarted. He was referred to EP for Watchman and AF ablation consideration.  S/p AF ablation 05/11/24, amiodarone  and Coreg  stopped 2/2 bradycardia.  Today he returns for HF follow up. Overall feeling fine. Not SOB walking to mailbox. He has rare atypical chest pain after eating he attributes to gas, it is non exertional. Denies palpitations, abnormal bleeding, dizziness, edema, or PND/Orthopnea. Appetite ok. Weight at home 179 pounds. Taking all medications. Off Plavix  and on Eliquis  per EP. Planning Watchman down the road. BP at home 150's.  ECG (personally reviewed): none ordered today  Labs (8/24): LDL 54 Labs (12/24): K 4.4, creatinine 1.7, hgb 10.9, LFTs normal Labs (4/25): K 4, creatinine 1.23, hgb 10.2 Labs (5/25): LDL 77 Labs (8/25): K 4.4, creatinine 1.97  PMH: 1. CVA: In 8/21, in setting of atrial fibrillation.  2. Type 2 diabetes 3. HTN 4. Hyperlipidemia 5. PAD: Mesenteric artery bypass in 6/15.  Bilateral iliac artery stents in 8/21 at [facility].  6. Carotid stenosis: Carotid dopplers in 7/23 with 40-59% LICA stenosis.  7. CKD stage 3 8. GI bleeding: 11/24, erosive gastritis.  9. Chronic systolic CHF: Echo in 8/21 in setting of CVA showed EF 35% with apical akinesis.  Repeat echo in 10/21 showed EF up  to 60-65%, mild-moderate AS.  Suspect fall in EF in 8/21 was due to stress cardiomyopathy in setting of CVA.  - Echo (7/23): EF 60-65%, moderate AS with mean gradient 28 mmHg, AVA 1.2 cm^2 by VTI.  - Echo (11/24) in setting of anemia and hypotension showed EF 30-35%, diffuse hypokinesis, normal RV, PASP 60 mmHg, mild-moderate MR, moderate AS.  - PCI to LAD and RCA in 12/24 (see below).  - RHC (12/24): mean RA 12, PA 57/23, mean PCWP 29, CI 2.23, PVR 1.2, PAPi 2.8.  - Echo (3/25): EF up to 55-60% with normal RV,  moderate aortic stenosis with mean gradient 17 mmHg and AVA 1.12 cm^2.  10. Aortic stenosis: Moderate on last echo in 3/25.  11. PE: 8/21.  12. Atrial fibrillation: Paroxysmal.  - s/p AF ablation 04/2024. 13. CAD: LHC (12/24) with 80% ostial RCA stenosis, 99% proximal LAD.  Patient was treated with DES to ostial RCA and proximal LAD.  14. Carotid stenosis: Carotid dopplers (12/24) with 40-59% LICA stenosis.   Family History  Problem Relation Age of Onset   Diabetes Mother    Heart disease Mother        before age 75   Hyperlipidemia Mother    Hypertension Mother    AAA (abdominal aortic aneurysm) Mother    Stroke Father    Hypertension Father    Coronary artery disease Father    Hyperlipidemia Father    Diabetes Father    Bladder Cancer Father    Cancer Father    Heart disease Father    Heart attack Father    Colon cancer Neg Hx    Throat cancer Neg Hx    Kidney disease Neg Hx    Liver disease Neg Hx    Social History   Socioeconomic History   Marital status: Widowed    Spouse name: Not on file   Number of children: 1   Years of education: Not on file   Highest education level: High school graduate  Occupational History   Occupation: Retired  Tobacco Use   Smoking status: Former    Current packs/day: 0.00    Types: Cigarettes    Quit date: 07/08/2009    Years since quitting: 14.8    Passive exposure: Past   Smokeless tobacco: Never  Vaping Use   Vaping status: Never Used  Substance and Sexual Activity   Alcohol use: Not Currently   Drug use: Not Currently   Sexual activity: Yes  Other Topics Concern   Not on file  Social History Narrative   Patient is married, he is retired. 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   Social Drivers of Health   Financial Resource Strain: Low Risk  (12/09/2023)   Overall Financial Resource Strain (CARDIA)    Difficulty of Paying Living Expenses: Not hard at all  Food Insecurity: No Food Insecurity (04/29/2024)    Hunger Vital Sign    Worried About Running Out of Food in the Last Year: Never true    Ran Out of Food in the Last Year: Never true  Transportation Needs: No Transportation Needs (04/29/2024)   PRAPARE - Administrator, Civil Service (Medical): No    Lack of Transportation (Non-Medical): No  Physical Activity: Inactive (12/04/2023)   Exercise Vital Sign    Days of Exercise per Week: 0 days    Minutes of Exercise per Session: 0 min  Stress: No Stress Concern Present (12/09/2023)   [employer]  of Occupational Health - Occupational Stress Questionnaire    Feeling of Stress : Not at all  Social Connections: Moderately Isolated (12/14/2023)   Social Connection and Isolation Panel    Frequency of Communication with Friends and Family: More than three times a week    Frequency of Social Gatherings with Friends and Family: More than three times a week    Attends Religious Services: Never    Database administrator or Organizations: Yes    Attends Banker Meetings: Never    Marital Status: Widowed  Intimate Partner Violence: Not At Risk (04/29/2024)   Humiliation, Afraid, Rape, and Kick questionnaire    Fear of Current or Ex-Partner: No    Emotionally Abused: No    Physically Abused: No    Sexually Abused: No   ROS: All systems reviewed and negative except as per HPI.   Current Outpatient Medications  Medication Sig Dispense Refill   Accu-Chek Softclix Lancets lancets SMARTSIG:Topical 1-3 Times Daily     allopurinol  (ZYLOPRIM ) 100 MG tablet TAKE 1 TABLET 2 TO 3 TIMES A DAY FOR GOUT **NEEDS TO BE SEEN BEFORE NEXT REFILL** (Patient taking differently: Take 100 mg by mouth 2 (two) times daily. TAKE 1 TABLET 2 TO 3 TIMES A DAY FOR GOUT **NEEDS TO BE SEEN BEFORE NEXT REFILL**) 90 tablet 0   apixaban  (ELIQUIS ) 5 MG TABS tablet Take 1 tablet (5 mg total) by mouth 2 (two) times daily. 60 tablet 6   atorvastatin  (LIPITOR ) 80 MG tablet Take 1 tablet (80 mg total) by mouth  daily. 90 tablet 3   Blood Glucose Monitoring Suppl (ACCU-CHEK GUIDE) w/Device KIT CHECK BLOOD SUGAR UP TO THREE TIMES DAILY AS DIRECTED     Blood Glucose Monitoring Suppl DEVI 1 each by Does not apply route in the morning, at noon, and at bedtime. May substitute to any manufacturer covered by patient's insurance. 1 each 0   Calcium  Carb-Cholecalciferol (CALCIUM  500 + D PO) Take 1 tablet by mouth daily.     carbidopa -levodopa  (SINEMET ) 10-100 MG tablet Take 1 tablet by mouth 3 (three) times daily. For parkinsonism 90 tablet 5   dapagliflozin  propanediol (FARXIGA ) 10 MG TABS tablet Take 1 tablet (10 mg total) by mouth daily. 30 tablet 11   ferrous sulfate  325 (65 FE) MG tablet Take 1 tablet (325 mg total) by mouth daily.     isosorbide -hydrALAZINE  (BIDIL ) 20-37.5 MG tablet Take 2 tablets by mouth 3 (three) times daily. 560 tablet 2   pantoprazole  (PROTONIX ) 40 MG tablet TAKE ONE TABLET TWICE DAILY 60 tablet 2   clopidogrel  (PLAVIX ) 75 MG tablet Take 1 tablet (75 mg total) by mouth daily. (Patient not taking: Reported on 05/26/2024) 90 tablet 3   No current facility-administered medications for this encounter.   Wt Readings from Last 3 Encounters:  05/26/24 83.2 kg (183 lb 6.4 oz)  05/11/24 83.9 kg (185 lb)  04/29/24 81.2 kg (179 lb)   BP (!) 176/68   Pulse 71   Ht 5' 8 (1.727 m)   Wt 83.2 kg (183 lb 6.4 oz)   SpO2 98%   BMI 27.89 kg/m   Physical Exam General:  NAD. No resp difficulty, walked into clinic HEENT: Normal Neck: Supple. No JVD. Cor: Regular rate & rhythm. No rubs, gallops, 2/6 SEM clear S2 Lungs: Clear Abdomen: Soft, nontender, nondistended.  Extremities: No cyanosis, clubbing, rash, edema Neuro: Alert & oriented x 3, moves all 4 extremities w/o difficulty. Affect pleasant.  Assessment/Plan: 1. Chronic  systolic => diastolic CHF: Drop in EF in 8/21 to 35% with apical akinesis in setting CVA with improvement to normal EF by 10/21.  This was thought to be due to stress  cardiomyopathy.  Echo in 11/24 showed EF back down to EF 30-35% with diffuse hypokinesis, normal RV, PASP 60 mmHg, mild-moderate MR, moderate AS. LHC in 12/24 showed severe ostial RCA/proximal LAD stenosis.  This was treated with DES to RCA and LAD.  Repeat echo in 3/25 showed EF up to 55-60% with normal RV, moderate aortic stenosis with mean gradient 17 mmHg and AVA 1.12 cm^2.  Stable NYHA class II symptoms. He is not volume overloaded on exam, GDMT limited by renal disease. - Stop BiDil  - Start hydralazine  100 mg tid + Imdur  30 mg daily. - Continue Farxiga  10 mg daily. - Off Coreg  due to bradycardia. - Off spiro with worsening renal function. - Hold off on ARB/ARBNi until renal function settles out. - Advised he wear his compression hose. 2. Atrial fibrillation: Persistent.  He is back on Eliquis  but has had recurrent GI bleeding from erosive gastritis. He was started on amiodarone  at 11/24 admission. Ideally, he would be on both Eliquis  + Plavix  given AF and stents to proximal RCA and LAD in 12/24.  Unfortunately, Plavix  previously stopped shortly after his PCI. He was very reluctant to restart Plavix  given epistaxis and GI bleeding.  - Suspect that he is not going to be able to take both Plavix  and Eliquis  due to bleeding issues. His PCI was 12/24. He is now off Plavix  and on Eliquis  due to recent AF ablation. Discussed with Dr. Rolan, would leave off Plavix  indefinitely.  - s/p AF ablation 04/2024. - Now off amio and Coreg  due to bradycardia. 3. CVA: 8/21, suspect due to atrial fibrillation.   - Unfortunately, think we are going to need to make the trade-off between Eliquis  and Plavix  for the time being. Dr. Rolan had a long discussion with him about this at previous visit; do not think he is going to be able to take both due to bleeding (stopped Plavix  soon after stents and continues on Eliquis ).   - Ultimately, would like Watchman. He has EP follow up 4. PE: In 8/21.   - Now back on Eliquis    5. Aortic stenosis: AS looked moderate on 3/25 echo. Clear S2. - Follow over time.  6. PAD: H/o iliac stents and mesenteric artery bypass.  No claudication or intestinal angina. No pedal ulcerations.  - Continue atorvastatin  80 mg daily. 7. Carotid stenosis: 40-59% LICA stenosis on dopplers in 12/24, repeat 12/25.  8. GI bleeding: 11/24 GI bleeding from erosive esophagitis. Recurrent GI bleeding in 3/25, no significant findings on EGD.  No further bleeding. - Avoid ETOH.  - Continue PPI.  - See discussion above regarding Plavix  and Eliquis .   9. CKD stage 3: Follow BMET carefully.   - Continue SGLT2i as above 10. HTN: BP has been quite high, despite BiDil  2 tab tid dosing. - Stop BiDil  - Start hydralazine  100 mg tid, start Imdur  30 mg daily. - Check BP daily and log. - Eventually, would replace Bidil  with ARB or Entresto , however declining renal function has prevented this. 11. CAD: 12/24 cath with 80% pRCA and 99% pLAD, treated with DES x 2.  Suspect ischemic cardiomyopathy given improvement on 3/25 echo.  Unfortunately, Plavix  was stopped not long after PCI, we did not realize that it was stopped.  He had severe epistaxis as well as suspected  GI bleeding with recurrent anemia on combination of Plavix  + Eliquis .   - He is now > 6 months out from him PCI. Discussed with Dr. Rolan, OK to remain off Plavix  on on Eliquis  alone until Watchman. - As above, ultimately think he needs to get a Watchman device. No chest pain. - Continue atorvastatin .  Follow up in 4 months with Dr. Rolan.   Harlene [name] [facility] FNP-BC 05/26/2024

## 2024-05-26 ENCOUNTER — Encounter (HOSPITAL_COMMUNITY): Payer: Self-pay

## 2024-05-26 ENCOUNTER — Ambulatory Visit (HOSPITAL_COMMUNITY)
Admission: RE | Admit: 2024-05-26 | Discharge: 2024-05-26 | Disposition: A | Discharge status: Home / Self Care | Source: Ambulatory Visit | Attending: Family Medicine | Admitting: Family Medicine

## 2024-05-26 ENCOUNTER — Ambulatory Visit (HOSPITAL_COMMUNITY): Payer: Self-pay | Admitting: Family Medicine

## 2024-05-26 VITALS — BP 176/68 | HR 71 | Ht 68.0 in | Wt 183.4 lb

## 2024-05-26 DIAGNOSIS — I350 Nonrheumatic aortic (valve) stenosis: Secondary | ICD-10-CM | POA: Diagnosis not present

## 2024-05-26 DIAGNOSIS — I4819 Other persistent atrial fibrillation: Secondary | ICD-10-CM | POA: Insufficient documentation

## 2024-05-26 DIAGNOSIS — E1122 Type 2 diabetes mellitus with diabetic chronic kidney disease: Secondary | ICD-10-CM | POA: Insufficient documentation

## 2024-05-26 DIAGNOSIS — I130 Hypertensive heart and chronic kidney disease with heart failure and stage 1 through stage 4 chronic kidney disease, or unspecified chronic kidney disease: Secondary | ICD-10-CM | POA: Insufficient documentation

## 2024-05-26 DIAGNOSIS — Z7984 Long term (current) use of oral hypoglycemic drugs: Secondary | ICD-10-CM | POA: Insufficient documentation

## 2024-05-26 DIAGNOSIS — Z7901 Long term (current) use of anticoagulants: Secondary | ICD-10-CM | POA: Diagnosis not present

## 2024-05-26 DIAGNOSIS — Z955 Presence of coronary angioplasty implant and graft: Secondary | ICD-10-CM | POA: Diagnosis not present

## 2024-05-26 DIAGNOSIS — I5042 Chronic combined systolic (congestive) and diastolic (congestive) heart failure: Secondary | ICD-10-CM | POA: Diagnosis not present

## 2024-05-26 DIAGNOSIS — Z79899 Other long term (current) drug therapy: Secondary | ICD-10-CM | POA: Diagnosis not present

## 2024-05-26 DIAGNOSIS — I2510 Atherosclerotic heart disease of native coronary artery without angina pectoris: Secondary | ICD-10-CM | POA: Diagnosis not present

## 2024-05-26 DIAGNOSIS — Z8673 Personal history of transient ischemic attack (TIA), and cerebral infarction without residual deficits: Secondary | ICD-10-CM | POA: Diagnosis not present

## 2024-05-26 DIAGNOSIS — Z8719 Personal history of other diseases of the digestive system: Secondary | ICD-10-CM

## 2024-05-26 DIAGNOSIS — N183 Chronic kidney disease, stage 3 unspecified: Secondary | ICD-10-CM | POA: Diagnosis not present

## 2024-05-26 DIAGNOSIS — I5022 Chronic systolic (congestive) heart failure: Secondary | ICD-10-CM

## 2024-05-26 DIAGNOSIS — I739 Peripheral vascular disease, unspecified: Secondary | ICD-10-CM | POA: Diagnosis not present

## 2024-05-26 DIAGNOSIS — I6523 Occlusion and stenosis of bilateral carotid arteries: Secondary | ICD-10-CM

## 2024-05-26 DIAGNOSIS — Z86711 Personal history of pulmonary embolism: Secondary | ICD-10-CM | POA: Insufficient documentation

## 2024-05-26 DIAGNOSIS — I10 Essential (primary) hypertension: Secondary | ICD-10-CM | POA: Diagnosis not present

## 2024-05-26 LAB — BASIC METABOLIC PANEL WITH GFR
Anion gap: 14 (ref 5–15)
BUN: 32 mg/dL — ABNORMAL HIGH (ref 8–23)
CO2: 23 mmol/L (ref 22–32)
Calcium: 9.6 mg/dL (ref 8.9–10.3)
Chloride: 104 mmol/L (ref 98–111)
Creatinine, Ser: 1.55 mg/dL — ABNORMAL HIGH (ref 0.61–1.24)
GFR, Estimated: 45 mL/min — ABNORMAL LOW (ref >60)
Glucose, Bld: 133 mg/dL — ABNORMAL HIGH (ref 70–99)
Potassium: 4.7 mmol/L (ref 3.5–5.1)
Sodium: 141 mmol/L (ref 135–145)

## 2024-05-26 LAB — BRAIN NATRIURETIC PEPTIDE: B Natriuretic Peptide: 419.2 pg/mL — ABNORMAL HIGH (ref 0.0–100.0)

## 2024-05-26 MED ORDER — ISOSORBIDE MONONITRATE ER 30 MG PO TB24: 30.0000 mg | ORAL_TABLET | Freq: Every day | ORAL | 3 refills | Status: AC

## 2024-05-26 MED ORDER — HYDRALAZINE HCL 100 MG PO TABS: 100.0000 mg | ORAL_TABLET | Freq: Three times a day (TID) | ORAL | 11 refills | Status: DC

## 2024-05-26 NOTE — Patient Instructions (Signed)
 Stop Bidil . Start hydralazine  100 mg three times daily - Rx sent. Start Imdur  30 mg daily - Rx sent. Labs today - will call you if abnormal. Return to [facility] in 4 weeks - see below. Return to see Dr. Rolan in 3 months.  PLEASE CALL [phone] IN NOVEMBER TO SCHEDULE THIS APPOINTMENT.  Please call us  at [phone] if any questions or concerns prior to your next visit.

## 2024-05-27 ENCOUNTER — Encounter (HOSPITAL_COMMUNITY): Payer: Self-pay

## 2024-05-28 ENCOUNTER — Other Ambulatory Visit: Payer: Self-pay | Admitting: *Deleted

## 2024-05-28 ENCOUNTER — Other Ambulatory Visit: Payer: Self-pay | Admitting: Family Medicine

## 2024-05-28 NOTE — Patient Outreach (Signed)
 Complex Care Management   Visit Note  05/28/2024  Name:  Connor Bryant MRN: [id number] DOB: [birth year]  Situation: Referral received for Complex Care Management related to Heart Failure and Atrial Fibrillation I obtained verbal consent from Patient.  Visit completed with Patient  on the phone  Background:   Past Medical History:  Diagnosis Date   Acute CVA (cerebrovascular accident) (HCC) 05/01/2020   Acute respiratory failure with hypoxia (HCC) 05/04/2020   Arthritis    Atrial fibrillation (HCC)    DM type 2 (diabetes mellitus, type 2) (HCC)    no meds   Endotracheally intubated    Gallstone    Heart murmur    was told one time he had a murmur, not since 30 years ago   HTN (hypertension)    Hyperlipidemia    Myocardial infarct, old    2021   Personal history of colonic polyp-adenoma 11/24/2013   Renal insufficiency    mild   Thoracic spine fracture (HCC)     Assessment: Patient Reported Symptoms:  Cognitive Cognitive Status: No symptoms reported Cognitive/Intellectual Conditions Management [RPT]: None reported or documented in medical history or problem list   Healing Pattern: Average Health Facilitated by: Rest  Neurological Neurological Review of Symptoms: No symptoms reported Neurological Self-Management Outcome: 4 (good)  HEENT HEENT Symptoms Reported: Nasal discharge HEENT Management Strategies: Routine screening HEENT Self-Management Outcome: 4 (good)    Cardiovascular Cardiovascular Symptoms Reported: No symptoms reported Does patient have uncontrolled Hypertension?: Yes Is patient checking Blood Pressure at home?: Yes Patient's Recent BP reading at home: 167/70 Cardiovascular Management Strategies: Routine screening Cardiovascular Self-Management Outcome: 3 (uncertain)  Respiratory Respiratory Symptoms Reported: No symptoms reported    Endocrine Endocrine Symptoms Reported: No symptoms reported Is patient diabetic?: Yes Is patient checking blood sugars  at home?: No Endocrine Self-Management Outcome: 4 (good)  Gastrointestinal Gastrointestinal Symptoms Reported: No symptoms reported Gastrointestinal Self-Management Outcome: 4 (good) Nutrition Risk Screen (CP): No indicators present  Genitourinary Genitourinary Symptoms Reported: No symptoms reported    Integumentary Integumentary Symptoms Reported: Wound Skin Management Strategies: Routine screening Skin Self-Management Outcome: 4 (good)  Musculoskeletal Musculoskelatal Symptoms Reviewed: No symptoms reported Musculoskeletal Self-Management Outcome: 4 (good) Falls in the past year?: Yes Number of falls in past year: 1 or less Was there an injury with Fall?: No Fall Risk Category Calculator: 1 Patient Fall Risk Level: Low Fall Risk Patient at Risk for Falls Due to: History of fall(s) Fall risk Follow up: Falls evaluation completed  Psychosocial Psychosocial Symptoms Reported: No symptoms reported Behavioral Health Self-Management Outcome: 4 (good) Major Change/Loss/Stressor/Fears (CP): Denies Quality of Family Relationships: involved, helpful, supportive Do you feel physically threatened by others?: No    05/28/2024    PHQ2-9 Depression Screening   Little interest or pleasure in doing things Not at all  Feeling down, depressed, or hopeless Not at all  PHQ-2 - Total Score 0  Trouble falling or staying asleep, or sleeping too much    Feeling tired or having little energy    Poor appetite or overeating     Feeling bad about yourself - or that you are a failure or have let yourself or your family down    Trouble concentrating on things, such as reading the newspaper or watching television    Moving or speaking so slowly that other people could have noticed.  Or the opposite - being so fidgety or restless that you have been moving around a lot more than usual    Thoughts that  you would be better off dead, or hurting yourself in some way    PHQ2-9 Total Score    If you checked off any  problems, how difficult have these problems made it for you to do your work, take care of things at home, or get along with other people    Depression Interventions/Treatment      There were no vitals filed for this visit.  Medications Reviewed Today     Reviewed by Bertrum Rosina [name], RN (Registered Nurse) on 05/28/24 at 1114  Med List Status: <None>   Medication Order Taking? Sig Documenting Provider Last Dose Status Informant  Accu-Chek Softclix Lancets lancets [id number]  SMARTSIG:Topical 1-3 Times Daily  Patient not taking: Reported on 05/28/2024   [provider]  Consider Medication Status and Discontinue Self  allopurinol  (ZYLOPRIM ) 100 MG tablet [id number] Yes TAKE 1 TABLET 2 TO 3 TIMES A DAY FOR GOUT **NEEDS TO BE SEEN BEFORE NEXT REFILLTHORA Zollie Lowers, MD  Active Self  apixaban  (ELIQUIS ) 5 MG TABS tablet [id number] Yes Take 1 tablet (5 mg total) by mouth 2 (two) times daily. Kennyth Chew, MD  Active Self  atorvastatin  (LIPITOR ) 80 MG tablet [id number] Yes Take 1 tablet (80 mg total) by mouth daily. Zollie Lowers, MD  Active Self           Med Note DRENA, ANGELICA G   Sat Dec 14, 2023  2:58 PM)    Blood Glucose Monitoring Suppl (ACCU-CHEK GUIDE) w/Device PRESSLEY [id number]  CHECK BLOOD SUGAR UP TO THREE TIMES DAILY AS DIRECTED  Patient not taking: Reported on 05/28/2024   [provider]  Consider Medication Status and Discontinue Self  Blood Glucose Monitoring Suppl DEVI [id number]  1 each by Does not apply route in the morning, at noon, and at bedtime. May substitute to any manufacturer covered by patient's insurance.  Patient not taking: Reported on 05/28/2024   Zollie Lowers, MD  Consider Medication Status and Discontinue Self  Calcium  Carb-Cholecalciferol (CALCIUM  500 + D PO) [id number] Yes Take 1 tablet by mouth daily. [provider]  Active Self  carbidopa -levodopa  (SINEMET ) 10-100 MG tablet [id number] Yes Take 1 tablet by mouth 3 (three) times daily. For  parkinsonism Zollie Lowers, MD  Active Self  clopidogrel  (PLAVIX ) 75 MG tablet [id number]  Take 1 tablet (75 mg total) by mouth daily.  Patient not taking: Reported on 05/28/2024   Rolan Ezra RAMAN, MD  Consider Medication Status and Discontinue Self  dapagliflozin  propanediol (FARXIGA ) 10 MG TABS tablet [id number] Yes Take 1 tablet (10 mg total) by mouth daily. [place], [place], [place]  Active Self  ferrous sulfate  325 (65 FE) MG tablet [id number] Yes Take 1 tablet (325 mg total) by mouth daily. Lee, [place], NP  Active Self  hydrALAZINE  (APRESOLINE ) 100 MG tablet [id number] Yes Take 1 tablet (100 mg total) by mouth 3 (three) times daily. [place], [place], [place]  Active   isosorbide  mononitrate (IMDUR ) 30 MG 24 hr tablet [id number] Yes Take 1 tablet (30 mg total) by mouth daily. [place], [place], [place]  Active   pantoprazole  (PROTONIX ) 40 MG tablet [id number] Yes TAKE ONE TABLET TWICE DAILY Zollie Lowers, MD  Active Self            Recommendation:   Continue Current Plan of Care  Follow Up Plan:   Closing From:  Complex Care Management Patient has met all care management goals. Care Management case will be closed. Patient has been provided contact information should new needs arise.  Rosina Forte, BSN RN [facility], [facility] RN Care Manager Direct Dial: [phone]  Fax: [phone]

## 2024-05-28 NOTE — Patient Instructions (Signed)
 Visit Information  Thank you for taking time to visit with me today. Please don't hesitate to contact me if I can be of assistance to you before our next scheduled appointment.  Your next care management appointment is no further scheduled appointments.    Closing From: Complex Care Management. Patient has met all care management goals. Care Management case will be closed. Patient has been provided contact information should new needs arise.   Please call the care guide team at [phone] if you need to cancel, schedule, or reschedule an appointment.   Please call the Suicide and Crisis Lifeline: 988 call the [place]  National Suicide Prevention Lifeline: 351-704-6882 or TTY: (951)597-7781 TTY 539-148-5302) to talk to a trained counselor call 1-800-273-TALK (toll free, 24 hour hotline) call the [facility]: [phone] call 911 if you are experiencing a Mental Health or Behavioral Health Crisis or need someone to talk to.  Grandville Lax, BSN RN [facility], [facility] RN Care Manager Direct Dial: [phone]  Fax: [phone]

## 2024-06-01 ENCOUNTER — Telehealth: Payer: Self-pay

## 2024-06-01 ENCOUNTER — Other Ambulatory Visit: Payer: Self-pay

## 2024-06-01 DIAGNOSIS — I4819 Other persistent atrial fibrillation: Secondary | ICD-10-CM

## 2024-06-01 DIAGNOSIS — Z8719 Personal history of other diseases of the digestive system: Secondary | ICD-10-CM

## 2024-06-01 NOTE — Addendum Note (Signed)
 Addended by: Ashli Selders A on: 06/01/2024 07:00 PM   Modules accepted: Orders

## 2024-06-01 NOTE — Telephone Encounter (Signed)
 The patient agreed to [facility] 07/23/2024. He will have labs drawn on 06/23/2024 in preparation for procedure. He was grateful for assistance.

## 2024-06-02 ENCOUNTER — Encounter: Payer: Self-pay | Admitting: Student

## 2024-06-02 ENCOUNTER — Ambulatory Visit: Attending: Cardiovascular Disease | Admitting: Student

## 2024-06-02 VITALS — BP 168/78 | HR 73 | Ht 68.0 in | Wt 184.3 lb

## 2024-06-02 DIAGNOSIS — Z8673 Personal history of transient ischemic attack (TIA), and cerebral infarction without residual deficits: Secondary | ICD-10-CM

## 2024-06-02 DIAGNOSIS — I4819 Other persistent atrial fibrillation: Secondary | ICD-10-CM

## 2024-06-02 DIAGNOSIS — Z8719 Personal history of other diseases of the digestive system: Secondary | ICD-10-CM | POA: Diagnosis not present

## 2024-06-02 NOTE — Patient Instructions (Signed)
 Medication Instructions:  Your physician recommends that you continue on your current medications as directed. Please refer to the Current Medication list given to you today.  *If you need a refill on your cardiac medications before your next appointment, please call your pharmacy*  Lab Work: None ordered today If you have labs (blood work) drawn today and your tests are completely normal, you will receive your results only by: MyChart Message (if you have MyChart) OR A paper copy in the mail If you have any lab test that is abnormal or we need to change your treatment, we will call you to review the results.  Testing/Procedures: See letter  Follow-Up: At [facility], you and your health needs are our priority.  As part of our continuing mission to provide you with exceptional heart care, our providers are all part of one team.  This team includes your primary Cardiologist (physician) and Advanced Practice Providers or APPs (Physician Assistants and Nurse Practitioners) who all work together to provide you with the care you need, when you need it.  Your next appointment:   As previously scheduled

## 2024-06-02 NOTE — Progress Notes (Signed)
  Electrophysiology Office Note:   Date:  06/02/2024  ID:  Connor Bryant, DOB 10/13/1945, MRN [id number]  Primary Cardiologist: Alvan Carrier, MD Electrophysiologist: Fonda Kitty, MD   Electrophysiologist:  Fonda Kitty, MD      History of Present Illness:   Connor Bryant is a 78 y.o. male with h/o CVA, multivessel CAD s/p proximal LAD and RCA stenting, persistent atrial fibrillation, GI bleeding and epistaxis on Plavix  and Eliquis , systolic heart failure with recovered LVEF seen today for routine electrophysiology follow-up s/p Ablation.  S/p PVI 05/12/2024 - Planning for Watchman 07/23/2024  Since last being seen in our clinic the patient reports doing OK. HRs improved from 40s after about a week. BP is still running high. Feels like it is worse on separate components of Bidil . Otherwise, he denies chest pain, palpitations, PND, orthopnea, nausea, vomiting, dizziness, syncope, edema, weight gain, or early satiety.    Review of systems complete and found to be negative unless listed in HPI.   EP Information / Studies Reviewed:    EKG is ordered today. Personal review as below.  EKG Interpretation Date/Time:  Tuesday June 02 2024 10:50:34 EDT Ventricular Rate:  73 PR Interval:  216 QRS Duration:  104 QT Interval:  422 QTC Calculation: 464 R Axis:   -62  Text Interpretation: Sinus rhythm with 1st degree A-V block with Premature atrial complexes Left axis deviation No signficant change from prior baseline Junctional rhythm no longer present Confirmed by Lesia Sharper [phone]) on 06/02/2024 10:58:29 AM    Arrhythmia/Device History S/p PVI 05/12/2024   Physical Exam:   VS:  BP (!) 168/78   Pulse 73   Ht 5' 8 (1.727 m)   Wt 184 lb 4.8 oz (83.6 kg)   SpO2 96%   BMI 28.02 kg/m    Wt Readings from Last 3 Encounters:  06/02/24 184 lb 4.8 oz (83.6 kg)  05/26/24 183 lb 6.4 oz (83.2 kg)  05/11/24 185 lb (83.9 kg)     GEN: No acute distress NECK: No JVD; No carotid  bruits CARDIAC: Regular rate and rhythm, no murmurs, rubs, gallops RESPIRATORY:  Clear to auscultation without rales, wheezing or rhonchi  ABDOMEN: Soft, non-tender, non-distended EXTREMITIES:  No edema; No deformity   ASSESSMENT AND PLAN:    Persistent AF S/p ablation 05/12/2024 Continue eliquis  5 mg BID for now Rates have improved. No further bradycardia or concerning symptoms  Scheduled for Watchman 07/23/2024. Discussed today and provided letter. He will return for labs on 06/23/24  Secondary hypercoagulable state Pt on Eliquis  as above   CAD No s/s of ischemia.      HFrecEF Echo 11/2023 LVEF 55-60% Felt to have been tachy-mediated component.   HTN Feels like it is worse at home on separate Bidil  Components. Will let HF clinic know.   Follow up with EP Team as usual post watchman.   Signed, Sharper Prentice Lesia, PA-C

## 2024-06-03 ENCOUNTER — Telehealth: Payer: Self-pay

## 2024-06-03 DIAGNOSIS — I739 Peripheral vascular disease, unspecified: Secondary | ICD-10-CM | POA: Diagnosis not present

## 2024-06-03 DIAGNOSIS — I129 Hypertensive chronic kidney disease with stage 1 through stage 4 chronic kidney disease, or unspecified chronic kidney disease: Secondary | ICD-10-CM | POA: Diagnosis not present

## 2024-06-03 DIAGNOSIS — I5020 Unspecified systolic (congestive) heart failure: Secondary | ICD-10-CM | POA: Diagnosis not present

## 2024-06-03 DIAGNOSIS — E872 Acidosis, unspecified: Secondary | ICD-10-CM | POA: Diagnosis not present

## 2024-06-03 DIAGNOSIS — N1832 Chronic kidney disease, stage 3b: Secondary | ICD-10-CM | POA: Diagnosis not present

## 2024-06-03 NOTE — Telephone Encounter (Signed)
 Connor Bryant

## 2024-06-08 ENCOUNTER — Ambulatory Visit (HOSPITAL_COMMUNITY): Admitting: Internal Medicine

## 2024-06-10 ENCOUNTER — Telehealth: Payer: Self-pay | Admitting: Cardiology

## 2024-06-10 NOTE — Telephone Encounter (Signed)
 The patient is concerned because he was not given CHG soap at his visit.  Informed him he may use antibacterial Dial soap. He was grateful for call and agreed with plan.

## 2024-06-10 NOTE — Progress Notes (Signed)
 Advanced Heart Failure Clinic Note   PCP: Renato Dorothey [name], NP Cardiology: Dr. Alvan HF Cardiology: Dr. Rolan  HPI:  78 y.o. with history of CVA, persistent atrial fibrillation, and systolic CHF was referred by Dr. Alvan for evaluation of CHF.  Patient was admitted in 04/2020 with CVA. HS-TnI elevated at 2500, echo showed EF 35% with apical akinesis.  This admission was complicated by PE, atrial fibrillation, AKI, anemia.  Cath was not done with anemia and AKI.  However, echo in 06/2020 showed EF back up to 60-65% with mild-moderate AS.  It was suspected that the fall in EF was due to a stress cardiomyopathy given improvement. Echo in 03/2022 showed EF 60-65%, moderate AS with mean gradient 28 mmHg, AVA 1.2 cm^2 by VTI.     In 07/2023, he was admitted to [facility] with anemia and hypotension, melena was present with hgb 7.5.  Creatinine up to 2.39.  He had 2 units PRBCs, and since still hypotensive, he was transferred to [facility]. Echo showed EF 30-35%, diffuse hypokinesis, normal RV, PASP 60 mmHg, mild-moderate MR, moderate AS. He was in atrial fibrillation during this admission.  Low BP limited GDMT titration.  Amiodarone  was begun for rate control of AF.  EGD was done showing erosive gastritis.  Eliquis  was restarted at discharge.    Patient had LHC/RHC in 08/2023, this showed 80% ostial RCA stenosis and 99% proximal LAD stenosis; RHC showed elevated right and left heart filling pressures.  He was admitted for diuresis and to evaluate for CABG vs PCI. He was deemed high risk for CABG, so PCI was done with DES to proximal LAD and ostial RCA. In the hospital, he was converted back into NSR.    He was readmitted in 11/2023 with influenza A PNA and also concern for GI bleeding with worsening hemoglobin.  He had severe epistaxis as well.  He was on Plavix  + Eliquis .  Unfortunately, Plavix  was stopped at this time and Eliquis  was continued.  Repeat echo in 11/2023 showed EF up to 55-60% with normal RV,  moderate aortic stenosis with mean gradient 17 mmHg and AVA 1.12 cm^2.    Follow up 12/2023, volume overloaded and off most meds. Plavix  restarted, but kept off Eliquis  as risk of bleeding felt to be too great. Spironolactone , Lasix , and amiodarone  restarted. He was referred to EP for Watchman and AF ablation consideration.   S/p AF ablation 05/11/24, amiodarone  and carvedilol  stopped 2/2 bradycardia. Planning for Watchman after 3 months of Eliquis .   Returned to [facility] for HF follow up 05/26/24. Overall was feeling fine. Was not SOB walking to mailbox. He had rare atypical chest pain after eating he attributed to gas, it was non exertional. Denied palpitations, abnormal bleeding, dizziness, edema, or PND/Orthopnea. Appetite was ok. Weight at home was 179 pounds. Reported taking all medications. Off Plavix  and on Eliquis  per EP. Planning Watchman down the road. SBP at home 150's.  Today he returns to HF clinic for BP check. At last visit with APP, Bidil  was stopped and he was changed to hydralazine  100 mg TID and Imdur  30 mg daily. Has been scheduled for Watchman 07/23/24, labs today in preparation for procedure (have already been ordered through Dr. Shaune office). Overall he is feeling pretty good today. No dizziness, lightheadedness, CP or palpitations. Since last visit his nephrologist started him on losartan . Was placed on losartan  25 mg daily on 9/10, then increased to 50 mg daily on 9/23. Since increasing to  losartan  50 mg daily, BP at home has been 114-135/60s, with most SBP's in the 120s. He is very diligent about checking his BP and logging the values. Takes his BP every day at 11 which is two hours after he takes his medications. BP in clinic today higher at 154/72, but this is likely related to stress from having to drive down [address] and not having valet parking operational when he got to the clinic. No SOB walking up and down driveway (75 feet each way). Weight has been stable at home,  180-184 lbs. No LEE, PND or orthopnea.    HF Medications: Losartan  50 mg daily Farxiga  10 mg daily Hydralazine  100 mg TID Isosorbide  mononitrate 30 mg daily  Has the patient been experiencing any side effects to the medications prescribed?  No  Does the patient have any problems obtaining medications due to transportation or finances?   No; [employer] Medicare  Understanding of regimen: good Understanding of indications: good Potential of compliance: good Patient understands to avoid NSAIDs. Patient understands to avoid decongestants.    Pertinent Lab Values: 06/11/24 (per [employer], ordered by [employer]): Serum creatinine 1.59, BUN 25, Potassium 3.8, Sodium 141  Vital Signs: Weight: 187.4 lbs (last clinic weight: 183.4 lbs) Blood pressure: 154/72 (but BP at home has been controlled recently)  Heart rate: 77   Assessment/Plan: 1. Chronic systolic => diastolic CHF: Drop in EF in 04/2020 to 35% with apical akinesis in setting CVA with improvement to normal EF by 06/2020.  This was thought to be due to stress cardiomyopathy.  Echo in 07/2023 showed EF back down to EF 30-35% with diffuse hypokinesis, normal RV, PASP 60 mmHg, mild-moderate MR, moderate AS. LHC in 08/2023 showed severe ostial RCA/proximal LAD stenosis.  This was treated with DES to RCA and LAD.  Repeat echo in 11/2023 showed EF up to 55-60% with normal RV, moderate aortic stenosis with mean gradient 17 mmHg and AVA 1.12 cm^2.   - Stable NYHA class II symptoms. He is not volume overloaded on exam, GDMT limited by renal disease. - Continue losartan  50 mg daily. Recently started and has been monitored by nephrologist. Tolerating well.  - Continue hydralazine  100 mg TID + Imdur  30 mg daily. - Continue Farxiga  10 mg daily. - Off carvedilol  due to bradycardia. - Off spironolactone  with worsening renal function. - Previously advised to wear his compression hose. 2. Atrial fibrillation: Persistent.  He is back on Eliquis  but has  had recurrent GI bleeding from erosive gastritis. He was started on amiodarone  at 07/2023 admission. Ideally, he would be on both Eliquis  + Plavix  given AF and stents to proximal RCA and LAD in 08/2023.  Unfortunately, Plavix  previously stopped shortly after his PCI. He was very reluctant to restart Plavix  given epistaxis and GI bleeding.  - Suspect that he is not going to be able to take both Plavix  and Eliquis  due to bleeding issues. His PCI was 08/2023. He is now off Plavix  and on Eliquis  due to recent AF ablation. Previously discussed with Dr. Rolan, would leave off Plavix  as long as he is on [facility]. - s/p AF ablation 04/2024. - Now off amiodarone  and carvedilol  due to bradycardia. - Watchman with Dr. Kennyth scheduled for 07/23/24 3. CVA: 04/2020, suspect due to atrial fibrillation.   - Unfortunately, think we are going to need to make the trade-off between Eliquis  and Plavix  for the time being. Dr. Rolan had a long discussion with him about this at previous visit; do not  think he is going to be able to take both due to bleeding (stopped Plavix  soon after stents and continues on Eliquis ).   - Watchman with Dr. Kennyth scheduled for 07/23/24 4. PE: In 04/2020.   - Now back on Eliquis   5. Aortic stenosis: AS looked moderate on 11/2023 echo. Clear S2. - Follow over time.  6. PAD: H/o iliac stents and mesenteric artery bypass.  No claudication or intestinal angina. No pedal ulcerations.  - Continue atorvastatin  80 mg daily. 7. Carotid stenosis: 40-59% LICA stenosis on dopplers in 08/2023, repeat 08/2024.  8. GI bleeding: 07/2023 GI bleeding from erosive esophagitis. Recurrent GI bleeding in 11/2023, no significant findings on EGD.  No further bleeding. - Avoid ETOH.  - Continue PPI.  - Watchman with Dr. Kennyth scheduled for 07/23/24 9. CKD stage 3: Follow BMET carefully.   - Continue SGLT2i as above 10. HTN: BP much improved since losartan  started and increased to 50 mg daily on 06/16/24. - Continue  losartan  50 mg daily (recently started by his nephrologist and tolerating well).  - Continue hydralazine  100 mg TID + Imdur  30 mg daily. - Continue to check BP daily and log. He is very diligent about this.   11. CAD: 08/2023 cath with 80% pRCA and 99% pLAD, treated with DES x 2.  Suspect ischemic cardiomyopathy given improvement on 11/2023 echo.  Unfortunately, Plavix  was stopped not long after PCI, we did not realize that it was stopped.  He had severe epistaxis as well as suspected GI bleeding with recurrent anemia on combination of Plavix  + Eliquis .   - He is now > 6 months out from him PCI. Discussed with Dr. Rolan, OK to remain off Plavix  on on Eliquis  alone until Watchman. - Continue atorvastatin .  Follow up 2 months with Dr. Rolan Tinnie Redman, PharmD, BCPS, [facility], CPP Heart Failure Clinic Pharmacist [phone]

## 2024-06-10 NOTE — Telephone Encounter (Signed)
 Patient stated he has some questions regarding his upcoming Watchman procedure.

## 2024-06-11 DIAGNOSIS — I129 Hypertensive chronic kidney disease with stage 1 through stage 4 chronic kidney disease, or unspecified chronic kidney disease: Secondary | ICD-10-CM | POA: Diagnosis not present

## 2024-06-11 DIAGNOSIS — N1832 Chronic kidney disease, stage 3b: Secondary | ICD-10-CM | POA: Diagnosis not present

## 2024-06-23 ENCOUNTER — Ambulatory Visit (HOSPITAL_COMMUNITY)
Admission: RE | Admit: 2024-06-23 | Discharge: 2024-06-23 | Disposition: A | Discharge status: Home / Self Care | Source: Ambulatory Visit | Attending: Cardiology | Admitting: Cardiology

## 2024-06-23 VITALS — BP 154/72 | HR 77 | Wt 187.4 lb

## 2024-06-23 DIAGNOSIS — N183 Chronic kidney disease, stage 3 unspecified: Secondary | ICD-10-CM | POA: Insufficient documentation

## 2024-06-23 DIAGNOSIS — I6522 Occlusion and stenosis of left carotid artery: Secondary | ICD-10-CM | POA: Diagnosis not present

## 2024-06-23 DIAGNOSIS — I350 Nonrheumatic aortic (valve) stenosis: Secondary | ICD-10-CM | POA: Insufficient documentation

## 2024-06-23 DIAGNOSIS — Z8673 Personal history of transient ischemic attack (TIA), and cerebral infarction without residual deficits: Secondary | ICD-10-CM | POA: Diagnosis not present

## 2024-06-23 DIAGNOSIS — Z7901 Long term (current) use of anticoagulants: Secondary | ICD-10-CM | POA: Diagnosis not present

## 2024-06-23 DIAGNOSIS — I5022 Chronic systolic (congestive) heart failure: Secondary | ICD-10-CM | POA: Insufficient documentation

## 2024-06-23 DIAGNOSIS — Z955 Presence of coronary angioplasty implant and graft: Secondary | ICD-10-CM | POA: Diagnosis not present

## 2024-06-23 DIAGNOSIS — I739 Peripheral vascular disease, unspecified: Secondary | ICD-10-CM | POA: Diagnosis not present

## 2024-06-23 DIAGNOSIS — I4819 Other persistent atrial fibrillation: Secondary | ICD-10-CM | POA: Diagnosis not present

## 2024-06-23 DIAGNOSIS — I2510 Atherosclerotic heart disease of native coronary artery without angina pectoris: Secondary | ICD-10-CM | POA: Diagnosis not present

## 2024-06-23 DIAGNOSIS — Z79899 Other long term (current) drug therapy: Secondary | ICD-10-CM | POA: Diagnosis not present

## 2024-06-23 DIAGNOSIS — Z86711 Personal history of pulmonary embolism: Secondary | ICD-10-CM | POA: Insufficient documentation

## 2024-06-23 DIAGNOSIS — I130 Hypertensive heart and chronic kidney disease with heart failure and stage 1 through stage 4 chronic kidney disease, or unspecified chronic kidney disease: Secondary | ICD-10-CM | POA: Diagnosis not present

## 2024-06-23 DIAGNOSIS — I10 Essential (primary) hypertension: Secondary | ICD-10-CM | POA: Diagnosis not present

## 2024-06-23 DIAGNOSIS — Z8719 Personal history of other diseases of the digestive system: Secondary | ICD-10-CM | POA: Diagnosis not present

## 2024-06-23 LAB — CBC WITH DIFFERENTIAL/PLATELET
Basophils Absolute: 0 x10E3/uL (ref 0.0–0.2)
Basos: 1 %
EOS (ABSOLUTE): 0.2 x10E3/uL (ref 0.0–0.4)
Eos: 3 %
Hematocrit: 40.8 % (ref 37.5–51.0)
Hemoglobin: 13 g/dL (ref 13.0–17.7)
Immature Grans (Abs): 0 x10E3/uL (ref 0.0–0.1)
Immature Granulocytes: 0 %
Lymphocytes Absolute: 1.3 x10E3/uL (ref 0.7–3.1)
Lymphs: 21 %
MCH: 29.4 pg (ref 26.6–33.0)
MCHC: 31.9 g/dL (ref 31.5–35.7)
MCV: 92 fL (ref 79–97)
Monocytes Absolute: 0.6 x10E3/uL (ref 0.1–0.9)
Monocytes: 9 %
Neutrophils Absolute: 4.2 x10E3/uL (ref 1.4–7.0)
Neutrophils: 65 %
Platelets: 190 x10E3/uL (ref 150–450)
RBC: 4.42 x10E6/uL (ref 4.14–5.80)
RDW: 14.5 % (ref 11.6–15.4)
WBC: 6.3 x10E3/uL (ref 3.4–10.8)

## 2024-06-23 NOTE — Patient Instructions (Addendum)
 It was a pleasure seeing you today!  MEDICATIONS: -No medication changes today -Call if you have questions about your medications.   NEXT APPOINTMENT: Return to clinic in 2 months with Dr. Rolan. December Code for parking garage: 1530   Call the clinic at [phone] with questions or to reschedule future appointments.

## 2024-06-24 LAB — BASIC METABOLIC PANEL WITH GFR
BUN/Creatinine Ratio: 17 (ref 10–24)
BUN: 25 mg/dL (ref 8–27)
CO2: 20 mmol/L (ref 20–29)
Calcium: 9.4 mg/dL (ref 8.6–10.2)
Chloride: 104 mmol/L (ref 96–106)
Creatinine, Ser: 1.5 mg/dL — ABNORMAL HIGH (ref 0.76–1.27)
Glucose: 130 mg/dL — ABNORMAL HIGH (ref 70–99)
Potassium: 4.1 mmol/L (ref 3.5–5.2)
Sodium: 141 mmol/L (ref 134–144)
eGFR: 47 mL/min/1.73 — ABNORMAL LOW (ref >59)

## 2024-06-28 ENCOUNTER — Ambulatory Visit: Payer: Self-pay | Admitting: Cardiology

## 2024-07-03 DIAGNOSIS — N1832 Chronic kidney disease, stage 3b: Secondary | ICD-10-CM | POA: Diagnosis not present

## 2024-07-03 DIAGNOSIS — I10 Essential (primary) hypertension: Secondary | ICD-10-CM | POA: Diagnosis not present

## 2024-07-08 ENCOUNTER — Encounter (INDEPENDENT_AMBULATORY_CARE_PROVIDER_SITE_OTHER): Payer: Self-pay | Admitting: Gastroenterology

## 2024-07-17 ENCOUNTER — Telehealth: Payer: Self-pay

## 2024-07-17 NOTE — Telephone Encounter (Addendum)
 Spoke with patient to review pre-watchman instructions.   Confirmed procedure date of 07/23/2024. Confirmed arrival time of 1015 for procedure time at 1245. Reviewed pre-procedure instructions with patient. Contrast allergy? No PPM or defibrillator? No Reviewed last dose of Farxiga  07/19/2024. The patient understands to call if questions/concerns arise prior to procedure. The patient was grateful for call and agreed with plan.

## 2024-07-17 NOTE — Telephone Encounter (Signed)
 Left voicemail for patient to return call to discuss pre Watchman instructions. Asked him to review his paperwork regarding medication instructions if he does not return call today.   Will try again later.

## 2024-07-23 ENCOUNTER — Inpatient Hospital Stay (HOSPITAL_COMMUNITY)

## 2024-07-23 ENCOUNTER — Inpatient Hospital Stay (HOSPITAL_COMMUNITY)
Admission: RE | Disposition: A | Discharge status: Home / Self Care | Payer: Self-pay | Source: Home / Self Care | Attending: Cardiology

## 2024-07-23 ENCOUNTER — Encounter (HOSPITAL_COMMUNITY): Payer: Self-pay | Admitting: Cardiology

## 2024-07-23 ENCOUNTER — Other Ambulatory Visit: Payer: Self-pay

## 2024-07-23 ENCOUNTER — Inpatient Hospital Stay (HOSPITAL_COMMUNITY)
Admission: RE | Admit: 2024-07-23 | Discharge: 2024-07-24 | DRG: 274 | Disposition: A | Discharge status: Home / Self Care | Attending: Cardiology | Admitting: Cardiology

## 2024-07-23 DIAGNOSIS — Z7982 Long term (current) use of aspirin: Secondary | ICD-10-CM | POA: Diagnosis not present

## 2024-07-23 DIAGNOSIS — Z955 Presence of coronary angioplasty implant and graft: Secondary | ICD-10-CM | POA: Diagnosis not present

## 2024-07-23 DIAGNOSIS — I10 Essential (primary) hypertension: Secondary | ICD-10-CM

## 2024-07-23 DIAGNOSIS — Z8673 Personal history of transient ischemic attack (TIA), and cerebral infarction without residual deficits: Secondary | ICD-10-CM | POA: Diagnosis not present

## 2024-07-23 DIAGNOSIS — Z7902 Long term (current) use of antithrombotics/antiplatelets: Secondary | ICD-10-CM | POA: Diagnosis not present

## 2024-07-23 DIAGNOSIS — Z87891 Personal history of nicotine dependence: Secondary | ICD-10-CM

## 2024-07-23 DIAGNOSIS — I4819 Other persistent atrial fibrillation: Secondary | ICD-10-CM

## 2024-07-23 DIAGNOSIS — I110 Hypertensive heart disease with heart failure: Secondary | ICD-10-CM | POA: Diagnosis present

## 2024-07-23 DIAGNOSIS — I252 Old myocardial infarction: Secondary | ICD-10-CM

## 2024-07-23 DIAGNOSIS — Z006 Encounter for examination for normal comparison and control in clinical research program: Secondary | ICD-10-CM

## 2024-07-23 DIAGNOSIS — Z7901 Long term (current) use of anticoagulants: Secondary | ICD-10-CM | POA: Diagnosis not present

## 2024-07-23 DIAGNOSIS — I2510 Atherosclerotic heart disease of native coronary artery without angina pectoris: Secondary | ICD-10-CM | POA: Diagnosis present

## 2024-07-23 DIAGNOSIS — D6869 Other thrombophilia: Secondary | ICD-10-CM | POA: Diagnosis present

## 2024-07-23 DIAGNOSIS — I4891 Unspecified atrial fibrillation: Principal | ICD-10-CM | POA: Diagnosis present

## 2024-07-23 DIAGNOSIS — Z95818 Presence of other cardiac implants and grafts: Secondary | ICD-10-CM | POA: Diagnosis not present

## 2024-07-23 DIAGNOSIS — I5022 Chronic systolic (congestive) heart failure: Secondary | ICD-10-CM | POA: Diagnosis present

## 2024-07-23 DIAGNOSIS — Z8719 Personal history of other diseases of the digestive system: Secondary | ICD-10-CM

## 2024-07-23 DIAGNOSIS — G20A1 Parkinson's disease without dyskinesia, without mention of fluctuations: Secondary | ICD-10-CM | POA: Diagnosis present

## 2024-07-23 DIAGNOSIS — E1151 Type 2 diabetes mellitus with diabetic peripheral angiopathy without gangrene: Secondary | ICD-10-CM | POA: Diagnosis present

## 2024-07-23 HISTORY — PX: TRANSESOPHAGEAL ECHOCARDIOGRAM (CATH LAB): EP1270

## 2024-07-23 HISTORY — PX: LEFT ATRIAL APPENDAGE OCCLUSION: EP1229

## 2024-07-23 LAB — GLUCOSE, CAPILLARY
Glucose-Capillary: 136 mg/dL — ABNORMAL HIGH (ref 70–99)
Glucose-Capillary: 129 mg/dL — ABNORMAL HIGH (ref 70–99)

## 2024-07-23 LAB — TYPE AND SCREEN
ABO/RH(D): O POS
Antibody Screen: NEGATIVE

## 2024-07-23 LAB — SURGICAL PCR SCREEN
MRSA, PCR: NEGATIVE
Staphylococcus aureus: NEGATIVE

## 2024-07-23 LAB — POCT ACTIVATED CLOTTING TIME: Activated Clotting Time: 337 s

## 2024-07-23 MED ORDER — ONDANSETRON HCL 4 MG/2ML IJ SOLN: 4.0000 mg | Freq: Four times a day (QID) | INTRAMUSCULAR | Status: DC | PRN

## 2024-07-23 MED ORDER — SODIUM CHLORIDE 0.9 % IV SOLN: INTRAVENOUS | Status: DC

## 2024-07-23 MED ORDER — LACTATED RINGERS IV SOLN: INTRAVENOUS | Status: DC

## 2024-07-23 MED ORDER — APIXABAN 5 MG PO TABS
5.0000 mg | ORAL_TABLET | Freq: Two times a day (BID) | ORAL | Status: DC
  Administered 2024-07-24: 5 mg via ORAL
  Filled 2024-07-23: qty 1

## 2024-07-23 MED ORDER — ACETAMINOPHEN 325 MG PO TABS: 650.0000 mg | ORAL_TABLET | ORAL | Status: DC | PRN

## 2024-07-23 MED ORDER — SODIUM CHLORIDE 0.9% FLUSH: 3.0000 mL | INTRAVENOUS | Status: DC | PRN

## 2024-07-23 MED ORDER — PROTAMINE SULFATE 10 MG/ML IV SOLN
INTRAVENOUS | Status: DC | PRN
  Administered 2024-07-23: 35 mg via INTRAVENOUS

## 2024-07-23 MED ORDER — HYDRALAZINE HCL 50 MG PO TABS
100.0000 mg | ORAL_TABLET | Freq: Three times a day (TID) | ORAL | Status: DC
  Administered 2024-07-23 – 2024-07-24 (×3): 100 mg via ORAL
  Filled 2024-07-23 (×4): qty 2

## 2024-07-23 MED ORDER — SODIUM CHLORIDE 0.9 % IV SOLN: 250.0000 mL | INTRAVENOUS | Status: DC | PRN

## 2024-07-23 MED ORDER — APIXABAN 5 MG PO TABS
5.0000 mg | ORAL_TABLET | Freq: Once | ORAL | Status: DC
  Filled 2024-07-23: qty 1

## 2024-07-23 MED ORDER — HEPARIN SODIUM (PORCINE) 1000 UNIT/ML IJ SOLN
INTRAMUSCULAR | Status: DC | PRN
  Administered 2024-07-23: 14000 [IU] via INTRAVENOUS

## 2024-07-23 MED ORDER — APIXABAN 5 MG PO TABS
ORAL_TABLET | ORAL | Status: AC
  Administered 2024-07-23: 5 mg via ORAL
  Filled 2024-07-23: qty 1

## 2024-07-23 MED ORDER — LOSARTAN POTASSIUM 50 MG PO TABS
50.0000 mg | ORAL_TABLET | Freq: Every day | ORAL | Status: DC
Start: 2024-07-23 — End: 2024-07-24
  Administered 2024-07-23 – 2024-07-24 (×2): 50 mg via ORAL
  Filled 2024-07-23 (×2): qty 1

## 2024-07-23 MED ORDER — PROPOFOL 10 MG/ML IV BOLUS
INTRAVENOUS | Status: DC | PRN
  Administered 2024-07-23: 150 mg via INTRAVENOUS

## 2024-07-23 MED ORDER — ONDANSETRON HCL 4 MG/2ML IJ SOLN
INTRAMUSCULAR | Status: DC | PRN
  Administered 2024-07-23: 4 mg via INTRAVENOUS

## 2024-07-23 MED ORDER — HEPARIN (PORCINE) IN NACL 2000-0.9 UNIT/L-% IV SOLN
INTRAVENOUS | Status: DC | PRN
  Administered 2024-07-23: 1000 mL

## 2024-07-23 MED ORDER — CHLORHEXIDINE GLUCONATE 0.12 % MT SOLN
15.0000 mL | Freq: Once | OROMUCOSAL | Status: AC
  Administered 2024-07-23: 15 mL via OROMUCOSAL
  Filled 2024-07-23: qty 15

## 2024-07-23 MED ORDER — FENTANYL CITRATE (PF) 250 MCG/5ML IJ SOLN
INTRAMUSCULAR | Status: DC | PRN
  Administered 2024-07-23 (×2): 50 ug via INTRAVENOUS

## 2024-07-23 MED ORDER — IOHEXOL 350 MG/ML SOLN
INTRAVENOUS | Status: DC | PRN
Start: 2024-07-23 — End: 2024-07-23
  Administered 2024-07-23: 15 mL

## 2024-07-23 MED ORDER — SODIUM CHLORIDE 0.9% FLUSH
3.0000 mL | Freq: Two times a day (BID) | INTRAVENOUS | Status: DC
  Administered 2024-07-23 – 2024-07-24 (×2): 3 mL via INTRAVENOUS

## 2024-07-23 MED ORDER — PHENYLEPHRINE HCL-NACL 20-0.9 MG/250ML-% IV SOLN
INTRAVENOUS | Status: DC | PRN
Start: 2024-07-23 — End: 2024-07-23
  Administered 2024-07-23: 40 ug/min via INTRAVENOUS

## 2024-07-23 MED ORDER — DEXAMETHASONE SOD PHOSPHATE PF 10 MG/ML IJ SOLN
INTRAMUSCULAR | Status: DC | PRN
  Administered 2024-07-23: 10 mg via INTRAVENOUS

## 2024-07-23 MED ORDER — APIXABAN 5 MG PO TABS: 5.0000 mg | ORAL_TABLET | Freq: Two times a day (BID) | ORAL | Status: DC

## 2024-07-23 MED ORDER — PHENYLEPHRINE 80 MCG/ML (10ML) SYRINGE FOR IV PUSH (FOR BLOOD PRESSURE SUPPORT)
PREFILLED_SYRINGE | INTRAVENOUS | Status: DC | PRN
  Administered 2024-07-23: 80 ug via INTRAVENOUS
  Administered 2024-07-23: 160 ug via INTRAVENOUS

## 2024-07-23 MED ORDER — FENTANYL CITRATE (PF) 100 MCG/2ML IJ SOLN
INTRAMUSCULAR | Status: AC
  Filled 2024-07-23: qty 2

## 2024-07-23 MED ORDER — LABETALOL HCL 5 MG/ML IV SOLN
INTRAVENOUS | Status: DC | PRN
  Administered 2024-07-23: 10 mg via INTRAVENOUS

## 2024-07-23 MED ORDER — HYDRALAZINE HCL 20 MG/ML IJ SOLN
INTRAMUSCULAR | Status: AC
  Filled 2024-07-23: qty 1

## 2024-07-23 MED ORDER — CEFAZOLIN SODIUM-DEXTROSE 2-4 GM/100ML-% IV SOLN
2.0000 g | INTRAVENOUS | Status: AC
  Administered 2024-07-23: 2 g via INTRAVENOUS
  Filled 2024-07-23: qty 100

## 2024-07-23 MED ORDER — CARBIDOPA-LEVODOPA 10-100 MG PO TABS
1.0000 | ORAL_TABLET | Freq: Three times a day (TID) | ORAL | Status: DC
  Administered 2024-07-23 – 2024-07-24 (×2): 1 via ORAL
  Filled 2024-07-23 (×4): qty 1

## 2024-07-23 MED ORDER — LIDOCAINE 2% (20 MG/ML) 5 ML SYRINGE
INTRAMUSCULAR | Status: DC | PRN
  Administered 2024-07-23: 100 mg via INTRAVENOUS

## 2024-07-23 MED ORDER — SUGAMMADEX SODIUM 200 MG/2ML IV SOLN
INTRAVENOUS | Status: DC | PRN
Start: 2024-07-23 — End: 2024-07-23
  Administered 2024-07-23: 169.6 mg via INTRAVENOUS

## 2024-07-23 MED ORDER — ROCURONIUM BROMIDE 10 MG/ML (PF) SYRINGE
PREFILLED_SYRINGE | INTRAVENOUS | Status: DC | PRN
  Administered 2024-07-23: 90 mg via INTRAVENOUS

## 2024-07-23 NOTE — Discharge Summary (Addendum)
 Electrophysiology Discharge Summary   Patient ID: Connor Bryant,  MRN: [id number], DOB/AGE: April 08, 1946 78 y.o.  Admit date: 07/23/2024 Discharge date: 07/24/2024  Primary Care Physician: Renato Dorothey [name], NP  Primary Cardiologist: Alvan Carrier, MD  Electrophysiologist: Fonda Kitty, MD     Primary Discharge Diagnosis:  Persistent Atrial Fibrillation Poor candidacy for long term anticoagulation due to frequent nosebleeds, gum bleeding and GI bleeding.  Secondary Discharge Diagnosis:  Multivessel CAD s/p proximal LAD and RCA stenting  CVA GIB  Epistaxis  HFrecEF  Procedures This Admission:  Transeptal Puncture Intra-procedural TEE which showed no LAA thrombus Left atrial appendage occlusive device placement on 07/23/24 by Dr. Kitty.    This study demonstrated: Successful implantation of a 27mm WATCHMAN Flx Pro left atrial appendage occlusive device.    TEE demonstrating no LAA thrombus. No early apparent complications.    Post Implant Anticoagulation Strategy: Continue Eliquis  5mg  BID for another 45 days then transition to dual anti-platelet therapy with aspirin  and Plavix . Ultimately, he will likely be on Plavix  monotherapy long-term due to his extensive CAD. Repeat imaging in 60 days with CT scan.      Brief HPI: Connor Bryant is a 78 y.o. male with a history of Persistent Atrial Fibrillation who was referred to Electrophysiology in the outpatient setting    Hospital Course:  The patient was admitted and underwent left atrial appendage occlusive device placement as above.  The patient was monitored overnight and has done very well with no concerns. Groin site has been stable without evidence of hematoma or bleeding. Wound care and restrictions were reviewed with the patient.   The patient has been scheduled for post procedure follow up with EP APP in approximately 6 weeks. They will restart Eliquis  this evening and continue for 45 days then stop. At that time  he will transition to Plavix  75mg  daily + ASA 81 mg daily to complete 6 months of therapy. At that time he will likely be on Plavix  long term due to extensive CAD.  They will require dental SBE for 6 month post op and should refrain from dental work or cleanings for the first 45 days post implant. SBE to be RXd at follow up.   A repeat CT scan will be performed in approximately 60 days to ensure proper seal of the device.    Physical Exam: Vitals:   07/23/24 2236 07/23/24 2327 07/24/24 0337 07/24/24 0810  BP: 138/66 135/60 (!) 130/55 (!) 143/59  Pulse:  67 (!) 58 62  Resp:  19 13 18   Temp:  97.9 F (36.6 C) 97.8 F (36.6 C) (!) 97.5 F (36.4 C)  TempSrc:  Oral Oral Oral  SpO2:  94% 96% 98%  Weight:      Height:        GEN: Well nourished, well developed in no acute distress NECK: No JVD; No carotid bruits CARDIAC: Regular rate and rhythm, no murmurs, rubs, gallops RESPIRATORY:  Clear to auscultation without rales, wheezing or rhonchi  ABDOMEN: Soft, non-tender, non-distended EXTREMITIES:  No edema; No deformity. Groin site Stable     Discharge Medications:  Allergies as of 07/24/2024   No Known Allergies      Medication List     TAKE these medications    Accu-Chek Softclix Lancets lancets SMARTSIG:Topical 1-3 Times Daily   acetaminophen  325 MG tablet Commonly known as: TYLENOL  Take 2 tablets (650 mg total) by mouth every 4 (four) hours as needed for headache or mild pain (pain  score 1-3).   allopurinol  100 MG tablet Commonly known as: ZYLOPRIM  TAKE 1 TABLET 2 TO 3 TIMES A DAY FOR GOUT **NEEDS TO BE SEEN BEFORE NEXT REFILL** What changed:  how much to take how to take this when to take this additional instructions   apixaban  5 MG Tabs tablet Commonly known as: ELIQUIS  Take 1 tablet (5 mg total) by mouth 2 (two) times daily.   atorvastatin  80 MG tablet Commonly known as: LIPITOR  Take 1 tablet (80 mg total) by mouth daily.   Blood Glucose Monitoring Suppl  Devi 1 each by Does not apply route in the morning, at noon, and at bedtime. May substitute to any manufacturer covered by patient's insurance.   Accu-Chek Guide w/Device Kit CHECK BLOOD SUGAR UP TO THREE TIMES DAILY AS DIRECTED   CALCIUM  500 + D PO Take 1 tablet by mouth daily.   carbidopa -levodopa  10-100 MG tablet Commonly known as: Sinemet  Take 1 tablet by mouth 3 (three) times daily. For parkinsonism   dapagliflozin  propanediol 10 MG Tabs tablet Commonly known as: FARXIGA  Take 1 tablet (10 mg total) by mouth daily.   ferrous sulfate  325 (65 FE) MG tablet Take 1 tablet (325 mg total) by mouth daily.   hydrALAZINE  100 MG tablet Commonly known as: APRESOLINE  Take 1 tablet (100 mg total) by mouth 3 (three) times daily.   isosorbide  mononitrate 30 MG 24 hr tablet Commonly known as: IMDUR  Take 1 tablet (30 mg total) by mouth daily.   losartan  50 MG tablet Commonly known as: COZAAR  Take 50 mg by mouth daily.   pantoprazole  40 MG tablet Commonly known as: PROTONIX  TAKE ONE TABLET TWICE DAILY        Disposition:  Home with usual follow up as in AVS   Signed, Ozell Prentice Passey, PA-C   I have seen, examined the patient, and reviewed the above assessment and plan.    Hospital Course: Patient underwent planned Watchman implantation for persistent atrial fibrillation and GI bleeding and epistaxis on 07/23/2024.  Device implantation was challenging due to anatomy, but ultimately underwent successful implantation of a 27 mm Watchman Flex Pro left atrial appendage occlusion device.  He was monitored overnight. No acute overnight events. Patient reported feeling relatively well on the morning of discharge with no new or acute complaints.  He was discharged home with follow-up arranged.  General: Well developed, in no acute distress.  Neck: No JVD.  Cardiac: Normal rate, regular rhythm.  Resp: Normal work of breathing.  Ext: No edema.  Right groin site soft without  bleeding or hematoma. Neuro: No gross focal deficits.  Psych: Normal affect.   Assessment and Plan:   # Persistent atrial fibrillation # Secondary hypercoagulable state due to atrial fibrillation # GI bleeding # Epistaxis -Continue Eliquis  5mg  BID for another 45 days then transition to dual anti-platelet therapy with aspirin  and Plavix . Ultimately, he will likely be on Plavix  monotherapy long-term due to his extensive CAD. Repeat imaging in 60 days with CT scan.   Duration of discharge encounter 35 minutes.  Fonda Kitty, MD 07/28/2024 9:54 PM

## 2024-07-23 NOTE — Transfer of Care (Signed)
 Immediate Anesthesia Transfer of Care Note  Patient: Connor Bryant  Procedure(s) Performed: LEFT ATRIAL APPENDAGE OCCLUSION TRANSESOPHAGEAL ECHOCARDIOGRAM  Patient Location: Cath Lab  Anesthesia Type:General  Level of Consciousness: awake, alert , and oriented  Airway & Oxygen Therapy: Patient Spontanous Breathing  Post-op Assessment: Report given to RN and Post -op Vital signs reviewed and stable  Post vital signs: Reviewed and stable  Last Vitals:  Vitals Value Taken Time  BP 147/56 07/23/24 14:40  Temp 98   Pulse 55 07/23/24 14:42  Resp 14 07/23/24 14:42  SpO2 91 % 07/23/24 14:42  Vitals shown include unfiled device data.  Last Pain:  Vitals:   07/23/24 1027  TempSrc:   PainSc: 0-No pain      Patients Stated Pain Goal: 0 (07/23/24 1027)  Complications: There were no known notable events for this encounter.

## 2024-07-23 NOTE — H&P (Signed)
 Electrophysiology Note:   Date:  07/23/24  ID:  Connor Bryant, DOB September 16, 1946, MRN [id number]   Primary Cardiologist: Alvan Carrier, MD Electrophysiologist: Fonda Kitty, MD       History of Present Illness:   Connor Bryant is a 78 y.o. male with h/o CVA, multivessel CAD s/p proximal LAD and RCA stenting, persistent atrial fibrillation, GI bleeding and epistaxis on Plavix  and Eliquis , systolic heart failure with recovered LVEF who is being seen today for evaluation for Watchman device and catheter ablation.   Discussed the use of AI scribe software for clinical note transcription with the patient, who gave verbal consent to proceed.   History of Present Illness He has a history of atrial fibrillation and heart failure, with fluctuating heart pumping function, particularly during episodes of atrial fibrillation. In November, atrial fibrillation was detected on monitors without symptoms, although he previously experienced palpitations, especially at night. No increased fatigue or dyspnea during these episodes. He is currently on amiodarone  to maintain normal rhythm. He has experienced bleeding issues with anticoagulation therapy when on both Eliquis  and Plavix , including gingival bleeding, epistaxis and GI bleeding. Currently, he is on Plavix  for his heart stents. He reports issues with blood pressure management. Recently prescribed an increased dose of Isosorbide  (a tablet and a half three times a day), he experienced dizziness and a blood pressure reading of 112/60 mmHg, which he felt was too low. Prefers his blood pressure to be around 120-130 mmHg; he states that he developed symptoms of lower than this.. After reducing the dose to one tablet, his blood pressure was 152 mmHg before taking the medication and 199 mmHg after. He acknowledges having 'white coat syndrome' and notes that his blood pressure readings at home are usually within normal range.  Interval: Patient presents today for  planned Watchman implant. Had AF ablation on 05/11/24. Reports feeling well today. No new or acute complaints.   Review of systems complete and found to be negative unless listed in HPI.    EP Information / Studies Reviewed:     EKG is not ordered today. EKG from 01/13/24 reviewed which showed sinus rhythm with PVC.        Echo 11/2023:   1. Left ventricular ejection fraction, by estimation, is 55 to 60%. The  left ventricle has normal function. Left ventricular endocardial border  not optimally defined to evaluate regional wall motion. There is mild left  ventricular hypertrophy. Left  ventricular diastolic parameters are consistent with Grade I diastolic  dysfunction (impaired relaxation). Elevated left ventricular end-diastolic  pressure.   2. Right ventricular systolic function is normal. The right ventricular  size is normal. There is normal pulmonary artery systolic pressure.   3. Left atrial size was moderately dilated.   4. The mitral valve is abnormal. Trivial mitral valve regurgitation. No  evidence of mitral stenosis. Severe mitral annular calcification.   5. The aortic valve is tricuspid. There is moderate calcification of the  aortic valve. There is moderate thickening of the aortic valve. Aortic  valve regurgitation is not visualized. Moderate aortic valve stenosis.  Aortic valve area, by VTI measures  1.12 cm. Aortic valve mean gradient measures 17.0 mmHg. Aortic valve Vmax  measures 2.90 m/s.   6. The inferior vena cava is normal in size with greater than 50%  respiratory variability, suggesting right atrial pressure of 3 mmHg.    LHC 08/2023:    Prox LAD to Mid LAD lesion is 99% stenosed.   A drug-eluting  stent was successfully placed using a SYNERGY XD 2.50X24.   Post intervention, there is a 0% residual stenosis.   Ost RCA lesion is 80% stenosed.   A drug-eluting stent was successfully placed using a SYNERGY XD 3.0X20.   A stent was successfully placed.   Post  intervention, there is a 0% residual stenosis.   Post intervention, there is a 0% residual stenosis.   Recommend to resume Apixaban , at currently prescribed dose and frequency on 09/16/2023.   Recommend concurrent antiplatelet therapy of Clopidogrel  75mg  daily for 12 months.   Successful PCI of the mid LAD with IVUS guidance, orbital atherectomy and DES x 1 Successful PCI of the ostial RCA with IVUS guidance, Shockwave lithotripsy and DES x 1   Plan: may resume Eliquis  tonight. Given high bleeding risk would treat with Plavix  monotherapy for 12 months/no ASA   Risk Assessment/Calculations:     CHA2DS2-VASc Score = 8   This indicates a 10.8% annual risk of stroke. The patient's score is based upon: CHF History: 1 HTN History: 1 Diabetes History: 1 Stroke History: 2 Vascular Disease History: 1 Age Score: 2 Gender Score: 0          Physical Exam:    Today's Vitals   07/23/24 1005 07/23/24 1027 07/23/24 1031  BP: (!) 199/64  (!) 188/63  Pulse: 82    Resp: 18    Temp: 98.5 F (36.9 C)    TempSrc: Oral    SpO2: 97%    Weight: 84.8 kg    Height: 5' 8 (1.727 m)    PainSc:  0-No pain    Body mass index is 28.43 kg/m.   GEN: Well nourished, well developed in no acute distress NECK: No JVD CARDIAC: Normal rate, regular rhythm RESPIRATORY:  Clear to auscultation without rales, wheezing or rhonchi  ABDOMEN: Soft, non-tender, non-distended EXTREMITIES:  No edema; No deformity    ASSESSMENT AND PLAN:   I have seen Connor Bryant in the office today who is being considered for a Watchman left atrial appendage closure device. I believe they will benefit from this procedure given their history of atrial fibrillation, CHA2DS2-VASc score of 8 and unadjusted ischemic stroke rate of 10.8% per year. Unfortunately, the patient is not felt to be a long term anticoagulation candidate secondary to bleeding - epistaxis, gingival bleeding, GI bleeding. The patient's chart has been reviewed  and I feel that they would be a candidate for short term oral anticoagulation after Watchman implant.    It is my belief that after undergoing a LAA closure procedure, Connor Bryant will not need long term anticoagulation which eliminates anticoagulation side effects and major bleeding risk.    Procedural risks for the Watchman implant have been reviewed with the patient including a 0.5% risk of stroke, <1% risk of perforation and <1% risk of device embolization. Other risks include bleeding, vascular damage, tamponade, worsening renal function, and death. The patient understands these risk and wishes to proceed.       The published clinical data on the safety and effectiveness of WATCHMAN include but are not limited to the following: - Holmes DR, Jess BEARD, Sick P et al. for the PROTECT AF Investigators. Percutaneous closure of the left atrial appendage versus warfarin therapy for prevention of stroke in patients with atrial fibrillation: a randomised non-inferiority trial. Lancet 2009; 374: 534-42. GLENWOOD Jess BEARD, Doshi SK, Jonita VEAR Satchel D et al. on behalf of the PROTECT AF Investigators. Percutaneous Left Atrial Appendage Closure  for Stroke Prophylaxis in Patients With Atrial Fibrillation 2.3-Year Follow-up of the PROTECT AF (Watchman Left Atrial Appendage System for Embolic Protection in Patients With Atrial Fibrillation) Trial. Circulation 2013; 127:720-729. - Alli O, Doshi S,  Kar S, Reddy VY, Sievert H et al. Quality of Life Assessment in the Randomized PROTECT AF (Percutaneous Closure of the Left Atrial Appendage Versus Warfarin Therapy for Prevention of Stroke in Patients With Atrial Fibrillation) Trial of Patients at Risk for Stroke With Nonvalvular Atrial Fibrillation. J Am Coll Cardiol 2013; 61:1790-8. GLENWOOD Satchel DR, Archer RAMAN, Price M, Whisenant B, Sievert H, Doshi S, Huber K, Reddy V. Prospective randomized evaluation of the Watchman left atrial appendage Device in patients with atrial  fibrillation versus long-term warfarin therapy; the PREVAIL trial. Journal of the [employer] of Cardiology, Vol. 4, No. 1, 2014, 1-11. - Kar S, Doshi SK, Sadhu A, Horton R, Osorio J et al. Primary outcome evaluation of a next-generation left atrial appendage closure device: results from the PINNACLE FLX trial. Circulation 2021;143(18)1754-1762.      After today's visit with the patient which was dedicated solely for shared decision making visit regarding LAA closure device, the patient decided to proceed with the LAA appendage closure procedure scheduled to be done in the near future at [facility]. Prior to the procedure, I would like to obtain a gated CT scan of the chest with contrast timed for PV/LA visualization.    HAS-BLED score  Hypertension Yes  Abnormal renal and liver function (Dialysis, transplant, Cr >2.26 mg/dL /Cirrhosis or Bilirubin >2x Normal or AST/ALT/AP >3x Normal) No  Stroke Yes  Bleeding Yes  Labile INR (Unstable/high INR) No  Elderly (>65) Yes  Drugs or alcohol (>= 8 drinks/week, anti-plt or NSAID) No    CHA2DS2-VASc Score = 8  The patient's score is based upon: CHF History: 1 HTN History: 1 Diabetes History: 1 Stroke History: 2 Vascular Disease History: 1 Age Score: 2 Gender Score: 0         ASSESSMENT AND PLAN: He also has history of bleeding while on both Eliquis  and Plavix . He needs Plavix  given significant CAD history. For this reason, he would benefit from [facility] device implant.    Persistent Atrial Fibrillation (ICD10:  I48.19) -S/p ablation on 05/11/24.    Secondary Hypercoagulable State (ICD10:  D68.69): CHA2DS2-VASc Score of 8.   CAD s/p pLAD and RCA PCI:  - Risks and benefits of Watchman have been discussed previously and were readdressed today. Patient voiced understanding and elected to proceed. Continue Eliquis  then DAPT in 45 days.     Signed, Fonda Kitty, MD

## 2024-07-23 NOTE — Anesthesia Procedure Notes (Signed)
 Procedure Name: Intubation Date/Time: 07/23/2024 12:40 PM  Performed by: Zelphia Norleen [name], CRNAPre-anesthesia Checklist: Patient identified, Emergency Drugs available, Suction available and Patient being monitored Patient Re-evaluated:Patient Re-evaluated prior to induction Oxygen Delivery Method: Circle system utilized Preoxygenation: Pre-oxygenation with 100% oxygen Induction Type: IV induction Ventilation: Mask ventilation without difficulty Laryngoscope Size: Mac and 3 Tube type: Oral Tube size: 7.5 mm Number of attempts: 1 Airway Equipment and Method: Stylet and Oral airway Placement Confirmation: ETT inserted through vocal cords under direct vision, positive ETCO2 and breath sounds checked- equal and bilateral Secured at: 23 cm Tube secured with: Tape Dental Injury: Teeth and Oropharynx as per pre-operative assessment

## 2024-07-23 NOTE — Anesthesia Preprocedure Evaluation (Addendum)
 Anesthesia Evaluation  Patient identified by MRN, date of birth, ID band Patient awake    Reviewed: Allergy & Precautions, NPO status , Patient's Chart, lab work & pertinent test results  History of Anesthesia Complications Negative for: history of anesthetic complications  Airway Mallampati: II  TM Distance: >3 FB Neck ROM: Full    Dental  (+) Teeth Intact, Dental Advisory Given   Pulmonary former smoker   breath sounds clear to auscultation       Cardiovascular hypertension, Pt. on medications + CAD, + Past MI, + Cardiac Stents, + Peripheral Vascular Disease (Carotid Stenosis; Hx of Iliac Stents) and +CHF  + Valvular Problems/Murmurs AS  Rhythm:Irregular Rate:Normal  TTE (11/2023): 1. Left ventricular ejection fraction, by estimation, is 55 to 60%. The  left ventricle has normal function. Left ventricular endocardial border  not optimally defined to evaluate regional wall motion. There is mild left  ventricular hypertrophy. Left ventricular diastolic parameters are consistent with Grade I diastolic dysfunction (impaired relaxation). Elevated left ventricular end-diastolic pressure.   2. Right ventricular systolic function is normal. The right ventricular  size is normal. There is normal pulmonary artery systolic pressure.   3. Left atrial size was moderately dilated.   4. The mitral valve is abnormal. Trivial mitral valve regurgitation. No  evidence of mitral stenosis. Severe mitral annular calcification.   5. The aortic valve is tricuspid. There is moderate calcification of the  aortic valve. There is moderate thickening of the aortic valve. Aortic  valve regurgitation is not visualized. Moderate aortic valve stenosis.  Aortic valve area, by VTI measures  1.12 cm. Aortic valve mean gradient measures 17.0 mmHg. Aortic valve Vmax  measures 2.90 m/s.   6. The inferior vena cava is normal in size with greater than 50%   respiratory variability, suggesting right atrial pressure of 3 mmHg.      Neuro/Psych  Neuromuscular disease (Parkinson's Disease) CVA    GI/Hepatic Hx of gastritis c/b GI bleed   Endo/Other  diabetes, Type 2    Renal/GU Renal InsufficiencyRenal disease     Musculoskeletal  (+) Arthritis ,    Abdominal   Peds  Hematology  (+) Blood dyscrasia, anemia   Anesthesia Other Findings   Reproductive/Obstetrics                              Anesthesia Physical Anesthesia Plan  ASA: 3  Anesthesia Plan: General   Post-op Pain Management:    Induction: Intravenous  PONV Risk Score and Plan: 1 and Ondansetron , Dexamethasone  and Treatment may vary due to age or medical condition  Airway Management Planned: Oral ETT  Additional Equipment:   Intra-op Plan:   Post-operative Plan: Extubation in OR  Informed Consent:      Dental advisory given  Plan Discussed with: CRNA and Surgeon  Anesthesia Plan Comments:          Anesthesia Quick Evaluation

## 2024-07-23 NOTE — Plan of Care (Signed)

## 2024-07-23 NOTE — Discharge Instructions (Signed)
 WATCHMANT Procedure, Care After  Procedure MD: Dr. Kennyth Olds Clinical Coordinator: Rockie Redman, RN  This sheet gives you information about how to care for yourself after your procedure. Your health care provider may also give you more specific instructions. If you have problems or questions, contact your health care provider.  What can I expect after the procedure? After the procedure, it is common to have: Bruising around your puncture site. Tenderness around your puncture site. Tiredness (fatigue).  Medication instructions It is very important to continue to take your blood thinner as directed by your doctor after the Watchman procedure. Call your procedure doctor's office with question or concerns. If you are on Coumadin (warfarin), you will have your INR checked the week after your procedure, with a goal INR of 2.0 - 3.0. Please follow your medication instructions on your discharge summary. Only take the medications listed on your discharge paperwork.  Follow up You will be seen in 6 weeks after your procedure You will have a repeat CT scan or Echocardiogram approximately 8 weeks after your procedure mark to check your device You will follow up the MD/APP who performed your procedure 6 months after your procedure The Watchman Clinical Coordinator will check in with you from time to time, including 1 and 2 years after your procedure.  NO DENTAL CLEANINGS FOR 45 days. After that, you will require antibiotics for dental procedures the first 6 months.   Follow these instructions at home: Puncture site care  Follow instructions from your health care provider about how to take care of your puncture site. Make sure you: If present, leave stitches (sutures), skin glue, or adhesive strips in place.  If a large square bandage is present, this may be removed 24 hours after surgery.  Check your puncture site every day for signs of infection. Check for: Redness, swelling, or pain. Fluid  or blood. If your puncture site starts to bleed, lie down on your back, apply firm pressure to the area, and contact your health care provider. Warmth. Pus or a bad smell. Driving Do not drive yourself home if you received sedation Do not drive for at least 4 days after your procedure or however long your health care provider recommends. (Do not resume driving if you have previously been instructed not to drive for other health reasons.) Do not spend greater than 1 hour at a time in a car for the first 3 days. Stop and take a break with a 5 minute walk at least every hour.  Do not drive or use heavy machinery while taking prescription pain medicine.  Activity Avoid activities that take a lot of effort, including exercise, for at least 7 days after your procedure. For the first 3 days, avoid sitting for longer than one hour at a time.  Avoid alcoholic beverages, signing paperwork, or participating in legal proceedings for 24 hours after receiving sedation Do not lift anything that is heavier than 10 lb (4.5 kg) for one week.  No sexual activity for 1 week.  Return to your normal activities as told by your health care provider. Ask your health care provider what activities are safe for you. General instructions Take over-the-counter and prescription medicines only as told by your health care provider. Do not use any products that contain nicotine or tobacco, such as cigarettes and e-cigarettes. If you need help quitting, ask your health care provider. You may shower after 24 hours, but Do not take baths, swim, or use a hot tub for  1 week.  Do not drink alcohol for 24 hours after your procedure. Keep all follow-up visits as told by your health care provider. This is important. Dental Work: You will require antibiotics prior to any dental work, including cleanings, for 6 months after your Watchman implantation to help protect you from infection. After 6 months, antibiotics are no longer  required. Contact a health care provider if: You have redness, mild swelling, or pain around your puncture site. You have soreness in your throat or at your puncture site that does not improve after several days You have fluid or blood coming from your puncture site that stops after applying firm pressure to the area. Your puncture site feels warm to the touch. You have pus or a bad smell coming from your puncture site. You have a fever. You have chest pain or discomfort that spreads to your neck, jaw, or arm. You are sweating a lot. You feel nauseous. You have a fast or irregular heartbeat. You have shortness of breath. You are dizzy or light-headed and feel the need to lie down. You have pain or numbness in the arm or leg closest to your puncture site. Get help right away if: Your puncture site suddenly swells. Your puncture site is bleeding and the bleeding does not stop after applying firm pressure to the area. These symptoms may represent a serious problem that is an emergency. Do not wait to see if the symptoms will go away. Get medical help right away. Call your local emergency services (911 in the [place] Do not drive yourself to the hospital. Summary After the procedure, it is normal to have bruising and tenderness at the puncture site in your groin, neck, or forearm. Check your puncture site every day for signs of infection. Get help right away if your puncture site is bleeding and the bleeding does not stop after applying firm pressure to the area. This is a medical emergency.  This information is not intended to replace advice given to you by your health care provider. Make sure you discuss any questions you have with your health care provider.

## 2024-07-23 NOTE — Anesthesia Postprocedure Evaluation (Signed)
 Anesthesia Post Note  Patient: Connor Bryant  Procedure(s) Performed: LEFT ATRIAL APPENDAGE OCCLUSION TRANSESOPHAGEAL ECHOCARDIOGRAM     Patient location during evaluation: Cath Lab Anesthesia Type: General Level of consciousness: awake Pain management: pain level controlled Vital Signs Assessment: post-procedure vital signs reviewed and stable Respiratory status: spontaneous breathing Cardiovascular status: blood pressure returned to baseline Postop Assessment: no apparent nausea or vomiting Anesthetic complications: no   There were no known notable events for this encounter.                Lauraine KATHEE [place]

## 2024-07-24 ENCOUNTER — Encounter (HOSPITAL_COMMUNITY): Payer: Self-pay | Admitting: Cardiology

## 2024-07-24 DIAGNOSIS — I4819 Other persistent atrial fibrillation: Secondary | ICD-10-CM | POA: Diagnosis not present

## 2024-07-24 DIAGNOSIS — Z95818 Presence of other cardiac implants and grafts: Secondary | ICD-10-CM | POA: Diagnosis not present

## 2024-07-24 LAB — ECHO TEE

## 2024-07-24 MED ORDER — ACETAMINOPHEN 325 MG PO TABS: 650.0000 mg | ORAL_TABLET | ORAL | Status: DC | PRN

## 2024-07-24 MED FILL — Fentanyl Citrate Preservative Free (PF) Inj 100 MCG/2ML: INTRAMUSCULAR | Qty: 2 | Status: CN

## 2024-07-24 MED FILL — Fentanyl Citrate Preservative Free (PF) Inj 100 MCG/2ML: INTRAMUSCULAR | Qty: 2 | Status: AC

## 2024-07-24 NOTE — Progress Notes (Signed)
 Mobility Specialist Progress Note:    07/24/24 0901  Mobility  Activity Ambulated independently  Level of Assistance Independent after set-up  Assistive Device None  Distance Ambulated (ft) 300 ft  Activity Response Tolerated well  Mobility Referral Yes  Mobility visit 1 Mobility  Mobility Specialist Start Time (ACUTE ONLY) 0901  Mobility Specialist Stop Time (ACUTE ONLY) 0909  Mobility Specialist Time Calculation (min) (ACUTE ONLY) 8 min   Pt received in bed, agreeable to mobility session. Ambulated in hallway independently after set-up. Tolerated well,asx throughout. Eager for d/c. Returned to room, All needs met.   Murline Weigel Mobility Specialist Please contact via Special Educational Needs Teacher or  Rehab office at [phone]

## 2024-07-27 ENCOUNTER — Encounter: Payer: Self-pay | Admitting: Radiology

## 2024-07-27 ENCOUNTER — Telehealth: Payer: Self-pay

## 2024-07-27 DIAGNOSIS — Z95818 Presence of other cardiac implants and grafts: Secondary | ICD-10-CM

## 2024-07-27 DIAGNOSIS — I480 Paroxysmal atrial fibrillation: Secondary | ICD-10-CM

## 2024-07-27 NOTE — Telephone Encounter (Signed)
  [facility]   Watchman Team  Contacted the patient regarding discharge from [facility] on 07/24/2024.  The patient understands to follow up with Charlies Arthur on 09/07/2024. Patient also wishes to keep OV with Dr. Rolan on 08/24/2024.  The patient understands discharge instructions? Yes  The patient understands medications and regimen? Yes   Patient has not removed bandage yet. Advised patient he was allowed to remove it 24 hours post procedure. He has since showered. Advised patient to remove the bandage. He does not report pain.   Scheduled post procedure imaging on 09/25/2024.  The patient understands to call with any questions or concerns prior to scheduled visit.   The patient was grateful for call and agreed with plan.

## 2024-08-03 ENCOUNTER — Other Ambulatory Visit (HOSPITAL_COMMUNITY): Payer: Self-pay

## 2024-08-03 ENCOUNTER — Other Ambulatory Visit: Payer: Self-pay

## 2024-08-03 NOTE — Progress Notes (Signed)
 Pharmacy Quality Measure Review  This patient is appearing on a report for being at risk of failing the adherence measure for diabetes medications this calendar year.   Medication: Farxiga  10 mg daily Last fill date: 07/01/2024 for 30 day supply  Spoke with the patient over the phone in regard to medication management. He has been able to restart his medications successfully after his recent surgery. His fasting blood sugars have been around 130-138 mg/dL every morning. He has no issues or concerns at this time regarding medications. He reports compliance with all medications. Since he had to hold his medications prior to surgery, he has a surplus on hand. He has enough medication to get through the weekend and does not need refills at this time. He does have refills on his prescription. Plans on calling the pharmacy soon to request a refill. Encouraged him to continue taking medications as prescribed and to refill prescription prior to running out of medication.   Woodie Jock, PharmD PGY1 Pharmacy Resident  08/03/2024

## 2024-08-07 ENCOUNTER — Other Ambulatory Visit: Payer: Self-pay | Admitting: *Deleted

## 2024-08-07 DIAGNOSIS — G20C Parkinsonism, unspecified: Secondary | ICD-10-CM

## 2024-08-11 ENCOUNTER — Encounter: Payer: Self-pay | Admitting: Student

## 2024-08-11 ENCOUNTER — Ambulatory Visit: Attending: Student | Admitting: Student

## 2024-08-11 VITALS — BP 142/72 | HR 82 | Ht 68.0 in | Wt 191.9 lb

## 2024-08-11 DIAGNOSIS — I10 Essential (primary) hypertension: Secondary | ICD-10-CM | POA: Diagnosis not present

## 2024-08-11 DIAGNOSIS — Z95818 Presence of other cardiac implants and grafts: Secondary | ICD-10-CM

## 2024-08-11 DIAGNOSIS — I480 Paroxysmal atrial fibrillation: Secondary | ICD-10-CM | POA: Diagnosis not present

## 2024-08-11 DIAGNOSIS — Z8719 Personal history of other diseases of the digestive system: Secondary | ICD-10-CM | POA: Diagnosis not present

## 2024-08-11 DIAGNOSIS — Z8673 Personal history of transient ischemic attack (TIA), and cerebral infarction without residual deficits: Secondary | ICD-10-CM

## 2024-08-11 NOTE — Progress Notes (Signed)
  Electrophysiology Office Note:   Date:  08/11/2024  ID:  Connor Bryant, DOB 1945-10-04, MRN [id number]  Primary Cardiologist: Alvan Carrier, MD Electrophysiologist: Fonda Kitty, MD      History of Present Illness:   Connor Bryant is a 78 y.o. male with h/o CVA, multivessel CAD s/p proximal LAD and RCA stenting, persistent atrial fibrillation, GI bleeding and epistaxis on Plavix  and Eliquis , systolic heart failure with recovered LVEF seen today for routine electrophysiology follow-up Ablation. Pt additionally had Watchman 07/23/2024.   Since last being seen in our clinic the patient reports doing well overall. No breakthrough arrhythmia of which he is aware. .  he denies chest pain, palpitations, dyspnea, PND, orthopnea, nausea, vomiting, dizziness, syncope, edema, weight gain, or early satiety.    Review of systems complete and found to be negative unless listed in HPI.   EP Information / Studies Reviewed:    EKG is ordered today. Personal review as below.       Arrhythmia/Device History S/p PVI 05/12/2024   Physical Exam:   VS:  BP (!) 142/72   Pulse 82   Ht 5' 8 (1.727 m)   Wt 191 lb 14.4 oz (87 kg)   SpO2 96%   BMI 29.18 kg/m    Wt Readings from Last 3 Encounters:  08/11/24 191 lb 14.4 oz (87 kg)  07/23/24 187 lb (84.8 kg)  06/23/24 187 lb 6.4 oz (85 kg)     GEN: No acute distress NECK: No JVD; No carotid bruits CARDIAC: Regular rate and rhythm, no murmurs, rubs, gallops RESPIRATORY:  Clear to auscultation without rales, wheezing or rhonchi  ABDOMEN: Soft, non-tender, non-distended EXTREMITIES:  No edema; No deformity   ASSESSMENT AND PLAN:    Atrial fibrillation Secondary hypercoagulable state S/p Watchman 07/23/2024 CT planned for 09/26/2023 Re-iterated need for prophylactic antibiotics for dental procedures He will need to continue Eliquis  through 09/06/2024, then transition to Plavix  75 mg daily on 09/07/2024 -> and continue at least until the 6 month  mark.    Team will call closer to the 6 week mark to arrange med change and pre-CT labwork; Not felt to need an additional in office visit.   CAD No s/s of ischemia.      HFrecEF Echo 11/2023 LVEF 55-60% Felt to have been tachy-mediated component.   HTN Stable on current regimen   Follow up with EP Team in April for 6 month post watchman visit.   Signed, Ozell Prentice Passey, PA-C

## 2024-08-11 NOTE — Patient Instructions (Signed)
 Medication Instructions:  No medication changes today. We will call you around the 6 week mark to change from Eliquis  to plavix .  *If you need a refill on your cardiac medications before your next appointment, please call your pharmacy*  Lab Work: You will need to return for blood work prior to your CT scan, we will call you with instructions.  If you have labs (blood work) drawn today and your tests are completely normal, you will receive your results only by: MyChart Message (if you have MyChart) OR A paper copy in the mail If you have any lab test that is abnormal or we need to change your treatment, we will call you to review the results.  Testing/Procedures: CT scan scheduled for January, Instructions attached.   Follow-Up: At [facility], you and your health needs are our priority.  As part of our continuing mission to provide you with exceptional heart care, our providers are all part of one team.  This team includes your primary Cardiologist (physician) and Advanced Practice Providers or APPs (Physician Assistants and Nurse Practitioners) who all work together to provide you with the care you need, when you need it.  Your next appointment:   At the end of April for 6 month post op follow up.   Provider:   You may see Fonda Kitty, MD or one of the following Advanced Practice Providers on your designated Care Team:   Charlies Arthur, PA-C Aletha Allebach Andy Louella Medaglia, PA-C Suzann Riddle, NP Daphne Barrack, NP    We recommend signing up for the patient portal called MyChart.  Sign up information is provided on this After Visit Summary.  MyChart is used to connect with patients for Virtual Visits (Telemedicine).  Patients are able to view lab/test results, encounter notes, upcoming appointments, etc.  Non-urgent messages can be sent to your provider as well.   To learn more about what you can do with MyChart, go to [email or web address].

## 2024-08-24 ENCOUNTER — Ambulatory Visit (HOSPITAL_COMMUNITY): Payer: Self-pay | Admitting: Cardiology

## 2024-08-24 ENCOUNTER — Ambulatory Visit (HOSPITAL_COMMUNITY)
Admission: RE | Admit: 2024-08-24 | Discharge: 2024-08-24 | Disposition: A | Source: Ambulatory Visit | Attending: Cardiology | Admitting: Cardiology

## 2024-08-24 ENCOUNTER — Encounter (HOSPITAL_COMMUNITY): Payer: Self-pay | Admitting: Cardiology

## 2024-08-24 VITALS — BP 150/70 | HR 84 | Wt 189.6 lb

## 2024-08-24 DIAGNOSIS — Z79899 Other long term (current) drug therapy: Secondary | ICD-10-CM | POA: Insufficient documentation

## 2024-08-24 DIAGNOSIS — I6522 Occlusion and stenosis of left carotid artery: Secondary | ICD-10-CM | POA: Insufficient documentation

## 2024-08-24 DIAGNOSIS — I480 Paroxysmal atrial fibrillation: Secondary | ICD-10-CM | POA: Insufficient documentation

## 2024-08-24 DIAGNOSIS — Z7984 Long term (current) use of oral hypoglycemic drugs: Secondary | ICD-10-CM | POA: Diagnosis not present

## 2024-08-24 DIAGNOSIS — I2510 Atherosclerotic heart disease of native coronary artery without angina pectoris: Secondary | ICD-10-CM | POA: Insufficient documentation

## 2024-08-24 DIAGNOSIS — I4819 Other persistent atrial fibrillation: Secondary | ICD-10-CM | POA: Diagnosis not present

## 2024-08-24 DIAGNOSIS — Z86711 Personal history of pulmonary embolism: Secondary | ICD-10-CM | POA: Insufficient documentation

## 2024-08-24 DIAGNOSIS — I5020 Unspecified systolic (congestive) heart failure: Secondary | ICD-10-CM | POA: Diagnosis not present

## 2024-08-24 DIAGNOSIS — E7800 Pure hypercholesterolemia, unspecified: Secondary | ICD-10-CM | POA: Diagnosis not present

## 2024-08-24 DIAGNOSIS — I739 Peripheral vascular disease, unspecified: Secondary | ICD-10-CM | POA: Insufficient documentation

## 2024-08-24 DIAGNOSIS — I5022 Chronic systolic (congestive) heart failure: Secondary | ICD-10-CM | POA: Insufficient documentation

## 2024-08-24 DIAGNOSIS — N183 Chronic kidney disease, stage 3 unspecified: Secondary | ICD-10-CM | POA: Diagnosis not present

## 2024-08-24 DIAGNOSIS — Z955 Presence of coronary angioplasty implant and graft: Secondary | ICD-10-CM | POA: Diagnosis not present

## 2024-08-24 DIAGNOSIS — Z8673 Personal history of transient ischemic attack (TIA), and cerebral infarction without residual deficits: Secondary | ICD-10-CM | POA: Insufficient documentation

## 2024-08-24 DIAGNOSIS — I130 Hypertensive heart and chronic kidney disease with heart failure and stage 1 through stage 4 chronic kidney disease, or unspecified chronic kidney disease: Secondary | ICD-10-CM | POA: Insufficient documentation

## 2024-08-24 LAB — BASIC METABOLIC PANEL WITH GFR
Anion gap: 13 (ref 5–15)
BUN: 35 mg/dL — ABNORMAL HIGH (ref 8–23)
CO2: 24 mmol/L (ref 22–32)
Calcium: 9.4 mg/dL (ref 8.9–10.3)
Chloride: 105 mmol/L (ref 98–111)
Creatinine, Ser: 1.61 mg/dL — ABNORMAL HIGH (ref 0.61–1.24)
GFR, Estimated: 43 mL/min — ABNORMAL LOW (ref >60)
Glucose, Bld: 135 mg/dL — ABNORMAL HIGH (ref 70–99)
Potassium: 3.8 mmol/L (ref 3.5–5.1)
Sodium: 142 mmol/L (ref 135–145)

## 2024-08-24 LAB — LIPID PANEL
Cholesterol: 166 mg/dL (ref 0–200)
HDL: 87 mg/dL (ref >40)
LDL Cholesterol: 69 mg/dL (ref 0–99)
Total CHOL/HDL Ratio: 1.9 ratio
Triglycerides: 52 mg/dL (ref <150)
VLDL: 10 mg/dL (ref 0–40)

## 2024-08-24 LAB — CBC
HCT: 40.8 % (ref 39.0–52.0)
Hemoglobin: 13.4 g/dL (ref 13.0–17.0)
MCH: 29.8 pg (ref 26.0–34.0)
MCHC: 32.8 g/dL (ref 30.0–36.0)
MCV: 90.9 fL (ref 80.0–100.0)
Platelets: 221 K/uL (ref 150–400)
RBC: 4.49 MIL/uL (ref 4.22–5.81)
RDW: 14.3 % (ref 11.5–15.5)
WBC: 8.1 K/uL (ref 4.0–10.5)
nRBC: 0 % (ref 0.0–0.2)

## 2024-08-24 NOTE — Patient Instructions (Signed)
 Medication Changes:  None, continue current medications  Lab Work:  Labs done today, your results will be available in MyChart, we will contact you for abnormal readings.  Special Instructions // Education:  Do the following things EVERYDAY: Weigh yourself in the morning before breakfast. Write it down and keep it in a log. Take your medicines as prescribed Eat low salt foods--Limit salt (sodium) to 2000 mg per day.  Stay as active as you can everyday Limit all fluids for the day to less than 2 liters   Follow-Up in: 6 months   At the [facility], you and your health needs are our priority. We have a designated team specialized in the treatment of Heart Failure. This Care Team includes your primary Heart Failure Specialized Cardiologist (physician), Advanced Practice Providers (APPs- Physician Assistants and Nurse Practitioners), and Pharmacist who all work together to provide you with the care you need, when you need it.   You may see any of the following providers on your designated Care Team at your next follow up:  Dr. Toribio Fuel Dr. Ezra Shuck Dr. Odis Brownie Greig Mosses, NP Caffie Shed, [place] [facility] [place], [place] Beckey Coe, NP Jordan Lee, NP Tinnie Redman, PharmD   Please be sure to bring in all your medications bottles to every appointment.   Need to Contact Us :  If you have any questions or concerns before your next appointment please send us  a message through [facility] or call our office at [phone].    TO LEAVE A MESSAGE FOR THE NURSE SELECT OPTION 2, PLEASE LEAVE A MESSAGE INCLUDING: YOUR NAME DATE OF BIRTH CALL BACK NUMBER REASON FOR CALL**this is important as we prioritize the call backs  YOU WILL RECEIVE A CALL BACK THE SAME DAY AS LONG AS YOU CALL BEFORE 4:00 PM

## 2024-08-24 NOTE — Progress Notes (Signed)
 PCP: Connor Dorothey [name], NP Cardiology: Dr. Alvan HF Cardiology: Dr. Rolan  Chief complaint: CHF  78 y.o. with history of CVA, persistent atrial fibrillation, and systolic CHF was referred by Dr. Alvan for evaluation of CHF.  Patient was admitted in 8/21 with CVA. HS-TnI elevated at 2500, echo showed EF 35% with apical akinesis.  This admission was complicated by PE, atrial fibrillation, AKI, anemia.  Cath was not done with anemia and AKI.  However, echo in 10/21 showed EF back up to 60-65% with mild-moderate AS.  It was suspected that the fall in EF was due to a stress cardiomyopathy given improvement. Echo in 7/23 showed EF 60-65%, moderate AS with mean gradient 28 mmHg, AVA 1.2 cm^2 by VTI.    In 11/24, he was admitted to [facility] with anemia and hypotension, melena was present with hgb 7.5.  Creatinine up to 2.39.  He had 2 units PRBCs, and since still hypotensive, he was transferred to [facility]. Echo showed EF 30-35%, diffuse hypokinesis, normal RV, PASP 60 mmHg, mild-moderate MR, moderate AS. He was was in atrial fibrillation during this admission.  Low BP limited GDMT titration.  Amiodarone  was begun for rate control of AF.  EGD was done showing erosive gastritis.  Eliquis  was restarted at discharge.   Patient had LHC/RHC in 12/24, this showed 80% ostial RCA stenosis and 99% proximal LAD stenosis; RHC showed elevated right and left heart filling pressures.  He was admitted for diuresis and to evaluate for CABG vs PCI. He was deemed high risk for CABG, so PCI was done with DES to proximal LAD and ostial RCA. In the hospital, he was converted back into NSR.   He was readmitted in 3/25 with influenza A PNA and also concern for GI bleeding with worsening hemoglobin.  He had severe epistaxis as well.  He was on Plavix  + Eliquis .  Unfortunately, Plavix  was stopped at this time and Eliquis  was continued.  Repeat echo in 3/25 showed EF up to 55-60% with normal RV, moderate aortic stenosis with mean  gradient 17 mmHg and AVA 1.12 cm^2.   Follow up 4/25, volume overloaded and off most meds. Plavix  restarted, but kept off Eliquis  as risk of bleeding felt to be too great. Spiro, Lasix , and amio restarted. He was referred to EP for Watchman and AF ablation consideration.  S/p AF ablation 05/11/24, amiodarone  and Coreg  stopped 2/2 bradycardia. Watchman placed in 10/25. TEE in 10/25 showed EF 60-65%, functionally bicuspid aortic valve, AVA 1.46 cm^2 by planimetry.   Today he returns for HF follow up. Weight is up 6 lbs.  SBP 110s-120s at home though BP high in the office today. He had an episode of palpitations and elevated HR last Saturday that lasted about 10 minutes.  He is in NSR today.  No exertional dyspnea or chest pain.  Does yardwork with only limitation being shoulder pain when using his leafblower.  No BRBPR/melena.   ECG (personally reviewed): NSR with low voltage P's, old ASMI  Labs (8/24): LDL 54 Labs (12/24): K 4.4, creatinine 1.7, hgb 10.9, LFTs normal Labs (4/25): K 4, creatinine 1.23, hgb 10.2 Labs (5/25): LDL 77 Labs (8/25): K 4.4, creatinine 1.97 Labs (9/25): K 4.1, creatinine 1.5, hgb 13  PMH: 1. CVA: In 8/21, in setting of atrial fibrillation.  2. Type 2 diabetes 3. HTN 4. Hyperlipidemia 5. PAD: Mesenteric artery bypass in 6/15.  Bilateral iliac artery stents in 8/21 at [facility].  6. Carotid stenosis: Carotid dopplers in 7/23  with 40-59% LICA stenosis.  7. CKD stage 3 8. GI bleeding: 11/24, erosive gastritis.  9. Chronic systolic CHF: Echo in 8/21 in setting of CVA showed EF 35% with apical akinesis.  Repeat echo in 10/21 showed EF up to 60-65%, mild-moderate AS.  Suspect fall in EF in 8/21 was due to stress cardiomyopathy in setting of CVA.  - Echo (7/23): EF 60-65%, moderate AS with mean gradient 28 mmHg, AVA 1.2 cm^2 by VTI.  - Echo (11/24) in setting of anemia and hypotension showed EF 30-35%, diffuse hypokinesis, normal RV, PASP 60 mmHg, mild-moderate MR, moderate  AS.  - PCI to LAD and RCA in 12/24 (see below).  - RHC (12/24): mean RA 12, PA 57/23, mean PCWP 29, CI 2.23, PVR 1.2, PAPi 2.8.  - Echo (3/25): EF up to 55-60% with normal RV, moderate aortic stenosis with mean gradient 17 mmHg and AVA 1.12 cm^2.  - TEE (10/25): EF 60-65%, functionally bicuspid aortic valve, AVA 1.46 cm^2 by planimetry.  10. Aortic stenosis: Moderate on last echo in 3/25.  - TEE (10/25) with functionally bicuspid aortic valve, AVA 1.46 cm^2 by planimetry.  11. PE: 8/21.  12. Atrial fibrillation: Paroxysmal.  - s/p AF ablation 04/2024. - Watchman 10/25 13. CAD: LHC (12/24) with 80% ostial RCA stenosis, 99% proximal LAD.  Patient was treated with DES to ostial RCA and proximal LAD.  14. Carotid stenosis: Carotid dopplers (12/24) with 40-59% LICA stenosis.   Family History  Problem Relation Age of Onset   Diabetes Mother    Heart disease Mother        before age 33   Hyperlipidemia Mother    Hypertension Mother    AAA (abdominal aortic aneurysm) Mother    Stroke Father    Hypertension Father    Coronary artery disease Father    Hyperlipidemia Father    Diabetes Father    Bladder Cancer Father    Cancer Father    Heart disease Father    Heart attack Father    Colon cancer Neg Hx    Throat cancer Neg Hx    Kidney disease Neg Hx    Liver disease Neg Hx    Social History   Socioeconomic History   Marital status: Widowed    Spouse name: Not on file   Number of children: 1   Years of education: Not on file   Highest education level: High school graduate  Occupational History   Occupation: Retired  Tobacco Use   Smoking status: Former    Current packs/day: 0.00    Types: Cigarettes    Quit date: 07/08/2009    Years since quitting: 15.1    Passive exposure: Past   Smokeless tobacco: Never  Vaping Use   Vaping status: Never Used  Substance and Sexual Activity   Alcohol use: Yes    Alcohol/week: 5.0 standard drinks of alcohol    Types: 5 Cans of beer per  week   Drug use: Not Currently   Sexual activity: Yes  Other Topics Concern   Not on file  Social History Narrative   Not on file   Social Drivers of Health   Financial Resource Strain: Low Risk  (12/09/2023)   Overall Financial Resource Strain (CARDIA)    Difficulty of Paying Living Expenses: Not hard at all  Food Insecurity: No Food Insecurity (07/23/2024)   Hunger Vital Sign    Worried About Running Out of Food in the Last Year: Never true    Ran  Out of Food in the Last Year: Never true  Transportation Needs: No Transportation Needs (07/23/2024)   PRAPARE - Administrator, Civil Service (Medical): No    Lack of Transportation (Non-Medical): No  Physical Activity: Inactive (12/04/2023)   Exercise Vital Sign    Days of Exercise per Week: 0 days    Minutes of Exercise per Session: 0 min  Stress: No Stress Concern Present (12/09/2023)   [employer] of Occupational Health - Occupational Stress Questionnaire    Feeling of Stress : Not at all  Social Connections: Moderately Isolated (07/23/2024)   Social Connection and Isolation Panel    Frequency of Communication with Friends and Family: More than three times a week    Frequency of Social Gatherings with Friends and Family: More than three times a week    Attends Religious Services: Never    Database Administrator or Organizations: Yes    Attends Banker Meetings: Never    Marital Status: Widowed  Intimate Partner Violence: Not At Risk (07/23/2024)   Humiliation, Afraid, Rape, and Kick questionnaire    Fear of Current or Ex-Partner: No    Emotionally Abused: No    Physically Abused: No    Sexually Abused: No   ROS: All systems reviewed and negative except as per HPI.   Current Outpatient Medications  Medication Sig Dispense Refill   Accu-Chek Softclix Lancets lancets SMARTSIG:Topical 1-3 Times Daily     acetaminophen  (TYLENOL ) 325 MG tablet Take 2 tablets (650 mg total) by mouth every 4 (four)  hours as needed for headache or mild pain (pain score 1-3).     allopurinol  (ZYLOPRIM ) 100 MG tablet Take 100 mg by mouth 2 (two) times daily.     apixaban  (ELIQUIS ) 5 MG TABS tablet Take 1 tablet (5 mg total) by mouth 2 (two) times daily. 60 tablet 6   atorvastatin  (LIPITOR ) 80 MG tablet Take 1 tablet (80 mg total) by mouth daily. 90 tablet 3   Blood Glucose Monitoring Suppl (ACCU-CHEK GUIDE) w/Device KIT CHECK BLOOD SUGAR UP TO THREE TIMES DAILY AS DIRECTED     Blood Glucose Monitoring Suppl DEVI 1 each by Does not apply route in the morning, at noon, and at bedtime. May substitute to any manufacturer covered by patient's insurance. 1 each 0   Calcium  Carb-Cholecalciferol (CALCIUM  500 + D PO) Take 1 tablet by mouth daily.     carbidopa -levodopa  (SINEMET ) 10-100 MG tablet Take 1 tablet by mouth 3 (three) times daily. For parkinsonism 90 tablet 5   dapagliflozin  propanediol (FARXIGA ) 10 MG TABS tablet Take 1 tablet (10 mg total) by mouth daily. 30 tablet 11   ferrous sulfate  325 (65 FE) MG tablet Take 1 tablet (325 mg total) by mouth daily.     hydrALAZINE  (APRESOLINE ) 100 MG tablet Take 1 tablet (100 mg total) by mouth 3 (three) times daily. 90 tablet 11   isosorbide  mononitrate (IMDUR ) 30 MG 24 hr tablet Take 1 tablet (30 mg total) by mouth daily. 90 tablet 3   losartan  (COZAAR ) 50 MG tablet Take 50 mg by mouth daily.     pantoprazole  (PROTONIX ) 40 MG tablet TAKE ONE TABLET TWICE DAILY 60 tablet 2   No current facility-administered medications for this encounter.   Wt Readings from Last 3 Encounters:  08/24/24 86 kg (189 lb 9.6 oz)  08/11/24 87 kg (191 lb 14.4 oz)  07/23/24 84.8 kg (187 lb)   BP (!) 150/70   Pulse 84  Wt 86 kg (189 lb 9.6 oz)   SpO2 97%   BMI 28.83 kg/m   Physical Exam General: NAD Neck: No JVD, no thyromegaly or thyroid  nodule.  Lungs: Clear to auscultation bilaterally with normal respiratory effort. CV: Nondisplaced PMI.  Heart regular S1/S2, no S3/S4, 3/6 SEM  RUSB with clear S2.  No peripheral edema.  No carotid bruit.  Normal pedal pulses.  Abdomen: Soft, nontender, no hepatosplenomegaly, no distention.  Skin: Intact without lesions or rashes.  Neurologic: Alert and oriented x 3.  Psych: Normal affect. Extremities: No clubbing or cyanosis.  HEENT: Normal.   Assessment/Plan: 1. Chronic systolic => diastolic CHF: Drop in EF in 8/21 to 35% with apical akinesis in setting CVA with improvement to normal EF by 10/21.  This was thought to be due to stress cardiomyopathy.  Echo in 11/24 showed EF back down to EF 30-35% with diffuse hypokinesis, normal RV, PASP 60 mmHg, mild-moderate MR, moderate AS. LHC in 12/24 showed severe ostial RCA/proximal LAD stenosis.  This was treated with DES to RCA and LAD.  Repeat echo in 3/25 showed EF up to 55-60% with normal RV, moderate aortic stenosis with mean gradient 17 mmHg and AVA 1.12 cm^2.  NYHA class I-II, not volume overloaded on exam.  - Continue hydralazine  100 mg tid + Imdur  30 mg daily. - Continue Farxiga  10 mg daily. - Off Coreg  due to bradycardia. - Off spiro with worsening renal function. - Continue losartan  50 mg daily.  BMET today.  - He does not appear to need Lasix .  2. Atrial fibrillation: Paroxysmal.  Off amiodarone  and Coreg  due to bradycardia.  S/p atrial fibrillation ablation and now with Watchman.  - Continue Eliquis  until 09/06/24 then will take Plavix  for 6 months.  3. CVA: 8/21, suspect due to atrial fibrillation.   - Now has Watchman.  4. PE: In 8/21.   5. Aortic stenosis: AS looked moderate on 3/25 echo and 10/25 TEE, functionally bicuspid valve. Clear S2 on exam. - Follow over time.  6. PAD: H/o iliac stents and mesenteric artery bypass.  No claudication or intestinal angina. No pedal ulcerations.  - Continue atorvastatin  80 mg daily.  Check lipids today.  7. Carotid stenosis: 40-59% LICA stenosis on dopplers in 12/24.  - I will arrange for repeat carotid dopplers.  8. GI bleeding:  11/24 GI bleeding from erosive esophagitis. Recurrent GI bleeding in 3/25, no significant findings on EGD.  No further bleeding.  Now with Watchman, will stop Eliquis  on 09/06/24.  - Avoid ETOH.  - Continue PPI.  9. CKD stage 3: Follow BMET carefully.   - Continue SGLT2i as above 10. HTN: BP has been well-controlled at home.  11. CAD: 12/24 cath with 80% pRCA and 99% pLAD, treated with DES x 2.  Suspect ischemic cardiomyopathy given improvement on 3/25 echo.  Unfortunately, Plavix  was stopped not long after PCI, we did not realize that it was stopped.  He had severe epistaxis as well as suspected GI bleeding with recurrent anemia on combination of Plavix  + Eliquis .  No chest pain.  - He is now on Eliquis  alone and will stop this and start Plavix  for 6 months on 09/06/24.  - Continue atorvastatin .  Follow up in 6 months with APP.   I spent 31 minutes reviewing records, interviewing/examining patient, and managing orders.    Connor Bryant  08/24/2024

## 2024-08-25 MED ORDER — EZETIMIBE 10 MG PO TABS: 10.0000 mg | ORAL_TABLET | Freq: Every day | ORAL | 3 refills | Status: AC

## 2024-09-03 ENCOUNTER — Telehealth: Payer: Self-pay | Admitting: Cardiology

## 2024-09-03 MED ORDER — CLOPIDOGREL BISULFATE 75 MG PO TABS: 75.0000 mg | ORAL_TABLET | Freq: Every day | ORAL | 6 refills | Status: DC

## 2024-09-03 NOTE — Telephone Encounter (Signed)
 From 11/18 office visit with Connor Passey, PA  He will need to continue Eliquis  through 09/06/2024, then transition to Plavix  75 mg daily on 09/07/2024 -> and continue at least until the 6 month mark.     Team will call closer to the 6 week mark to arrange med change and pre-CT labwork; Not felt to need an additional in office visit.   I spoke with patient and gave him message about changing from Eliquis  to Plavix .  Prescription sent to [facility].  Follow up appointment made for patient to see Connor Passey, PA on 01/04/25.  Patient had lab work on 08/24/24.  CT Scan is scheduled for 09/25/24.  I told patient we would check with Connor Bryant to see if additional lab work needed prior to scan

## 2024-09-03 NOTE — Telephone Encounter (Signed)
 Spoke with patient. Advised does not need repeat labs prior to CT 09/25/24. Labs from 08/24/24 would be sufficient. He verbalized understanding and was grateful for the call.

## 2024-09-03 NOTE — Telephone Encounter (Signed)
 Pt wants to know when he should stop taking Eliquis . Please advise.

## 2024-09-07 ENCOUNTER — Ambulatory Visit: Admitting: Physician Assistant

## 2024-09-23 ENCOUNTER — Encounter (HOSPITAL_COMMUNITY): Payer: Self-pay

## 2024-09-23 ENCOUNTER — Emergency Department (HOSPITAL_COMMUNITY)

## 2024-09-23 ENCOUNTER — Other Ambulatory Visit: Payer: Self-pay

## 2024-09-23 ENCOUNTER — Inpatient Hospital Stay (HOSPITAL_COMMUNITY)
Admission: EM | Admit: 2024-09-23 | Discharge: 2024-10-02 | DRG: 480 | Disposition: A | Discharge status: Skilled Nursing Facility | Attending: Internal Medicine | Admitting: Internal Medicine

## 2024-09-23 DIAGNOSIS — I2699 Other pulmonary embolism without acute cor pulmonale: Secondary | ICD-10-CM | POA: Insufficient documentation

## 2024-09-23 DIAGNOSIS — G20C Parkinsonism, unspecified: Secondary | ICD-10-CM | POA: Diagnosis present

## 2024-09-23 DIAGNOSIS — I4891 Unspecified atrial fibrillation: Secondary | ICD-10-CM | POA: Diagnosis present

## 2024-09-23 DIAGNOSIS — Z860100 Personal history of colon polyps, unspecified: Secondary | ICD-10-CM

## 2024-09-23 DIAGNOSIS — D509 Iron deficiency anemia, unspecified: Secondary | ICD-10-CM | POA: Diagnosis present

## 2024-09-23 DIAGNOSIS — Z823 Family history of stroke: Secondary | ICD-10-CM

## 2024-09-23 DIAGNOSIS — Z7982 Long term (current) use of aspirin: Secondary | ICD-10-CM

## 2024-09-23 DIAGNOSIS — E1122 Type 2 diabetes mellitus with diabetic chronic kidney disease: Secondary | ICD-10-CM | POA: Diagnosis present

## 2024-09-23 DIAGNOSIS — I130 Hypertensive heart and chronic kidney disease with heart failure and stage 1 through stage 4 chronic kidney disease, or unspecified chronic kidney disease: Secondary | ICD-10-CM | POA: Diagnosis present

## 2024-09-23 DIAGNOSIS — Z955 Presence of coronary angioplasty implant and graft: Secondary | ICD-10-CM

## 2024-09-23 DIAGNOSIS — Z8249 Family history of ischemic heart disease and other diseases of the circulatory system: Secondary | ICD-10-CM

## 2024-09-23 DIAGNOSIS — Z95818 Presence of other cardiac implants and grafts: Secondary | ICD-10-CM

## 2024-09-23 DIAGNOSIS — I2510 Atherosclerotic heart disease of native coronary artery without angina pectoris: Secondary | ICD-10-CM | POA: Diagnosis present

## 2024-09-23 DIAGNOSIS — I252 Old myocardial infarction: Secondary | ICD-10-CM

## 2024-09-23 DIAGNOSIS — W010XXA Fall on same level from slipping, tripping and stumbling without subsequent striking against object, initial encounter: Secondary | ICD-10-CM | POA: Diagnosis present

## 2024-09-23 DIAGNOSIS — R0682 Tachypnea, not elsewhere classified: Secondary | ICD-10-CM | POA: Diagnosis present

## 2024-09-23 DIAGNOSIS — N1832 Chronic kidney disease, stage 3b: Secondary | ICD-10-CM | POA: Diagnosis present

## 2024-09-23 DIAGNOSIS — Z83438 Family history of other disorder of lipoprotein metabolism and other lipidemia: Secondary | ICD-10-CM

## 2024-09-23 DIAGNOSIS — D62 Acute posthemorrhagic anemia: Secondary | ICD-10-CM | POA: Diagnosis present

## 2024-09-23 DIAGNOSIS — I5032 Chronic diastolic (congestive) heart failure: Secondary | ICD-10-CM | POA: Diagnosis present

## 2024-09-23 DIAGNOSIS — Z833 Family history of diabetes mellitus: Secondary | ICD-10-CM

## 2024-09-23 DIAGNOSIS — Y92009 Unspecified place in unspecified non-institutional (private) residence as the place of occurrence of the external cause: Secondary | ICD-10-CM

## 2024-09-23 DIAGNOSIS — F32A Depression, unspecified: Secondary | ICD-10-CM | POA: Diagnosis present

## 2024-09-23 DIAGNOSIS — Z87891 Personal history of nicotine dependence: Secondary | ICD-10-CM

## 2024-09-23 DIAGNOSIS — Z9842 Cataract extraction status, left eye: Secondary | ICD-10-CM

## 2024-09-23 DIAGNOSIS — E785 Hyperlipidemia, unspecified: Secondary | ICD-10-CM | POA: Diagnosis present

## 2024-09-23 DIAGNOSIS — S72141A Displaced intertrochanteric fracture of right femur, initial encounter for closed fracture: Principal | ICD-10-CM | POA: Diagnosis present

## 2024-09-23 DIAGNOSIS — K219 Gastro-esophageal reflux disease without esophagitis: Secondary | ICD-10-CM | POA: Diagnosis present

## 2024-09-23 DIAGNOSIS — E119 Type 2 diabetes mellitus without complications: Secondary | ICD-10-CM

## 2024-09-23 DIAGNOSIS — I4819 Other persistent atrial fibrillation: Secondary | ICD-10-CM | POA: Diagnosis present

## 2024-09-23 DIAGNOSIS — I10 Essential (primary) hypertension: Secondary | ICD-10-CM | POA: Diagnosis present

## 2024-09-23 DIAGNOSIS — Z9841 Cataract extraction status, right eye: Secondary | ICD-10-CM

## 2024-09-23 DIAGNOSIS — Z8673 Personal history of transient ischemic attack (TIA), and cerebral infarction without residual deficits: Secondary | ICD-10-CM

## 2024-09-23 DIAGNOSIS — Z23 Encounter for immunization: Secondary | ICD-10-CM

## 2024-09-23 DIAGNOSIS — Z8052 Family history of malignant neoplasm of bladder: Secondary | ICD-10-CM

## 2024-09-23 DIAGNOSIS — I2694 Multiple subsegmental pulmonary emboli without acute cor pulmonale: Secondary | ICD-10-CM | POA: Diagnosis present

## 2024-09-23 DIAGNOSIS — Z7902 Long term (current) use of antithrombotics/antiplatelets: Secondary | ICD-10-CM

## 2024-09-23 DIAGNOSIS — Z79899 Other long term (current) drug therapy: Secondary | ICD-10-CM

## 2024-09-23 DIAGNOSIS — R079 Chest pain, unspecified: Secondary | ICD-10-CM | POA: Diagnosis present

## 2024-09-23 MED ORDER — MORPHINE SULFATE (PF) 4 MG/ML IV SOLN
4.0000 mg | INTRAVENOUS | Status: DC | PRN
  Administered 2024-09-24: 4 mg via INTRAVENOUS
  Filled 2024-09-23: qty 1

## 2024-09-23 MED ORDER — ONDANSETRON HCL 4 MG/2ML IJ SOLN
4.0000 mg | Freq: Once | INTRAMUSCULAR | Status: AC
  Administered 2024-09-24: 4 mg via INTRAVENOUS
  Filled 2024-09-23: qty 2

## 2024-09-23 NOTE — ED Triage Notes (Signed)
 [facility] from home. Cc of a fall. Got twisted up and fell. C/o right hip pain. Ems gave a total of 100mcg fentanyl  in a 22 r hand without much relief.  C/o pain and spasms in right leg.  With EMS--- 207/84. ..  223 CBG Patient had on c- collar but took it off.  Denies any other injuries.  Patient talking on cell phone

## 2024-09-24 ENCOUNTER — Inpatient Hospital Stay (HOSPITAL_COMMUNITY)

## 2024-09-24 ENCOUNTER — Encounter (HOSPITAL_COMMUNITY)
Admission: EM | Disposition: A | Discharge status: Skilled Nursing Facility | Payer: Self-pay | Source: Home / Self Care | Attending: Internal Medicine

## 2024-09-24 ENCOUNTER — Inpatient Hospital Stay (HOSPITAL_COMMUNITY): Admitting: Certified Registered"

## 2024-09-24 ENCOUNTER — Encounter (HOSPITAL_COMMUNITY): Payer: Self-pay | Admitting: Internal Medicine

## 2024-09-24 DIAGNOSIS — K219 Gastro-esophageal reflux disease without esophagitis: Secondary | ICD-10-CM | POA: Diagnosis present

## 2024-09-24 DIAGNOSIS — Z23 Encounter for immunization: Secondary | ICD-10-CM | POA: Diagnosis present

## 2024-09-24 DIAGNOSIS — Z7902 Long term (current) use of antithrombotics/antiplatelets: Secondary | ICD-10-CM | POA: Diagnosis not present

## 2024-09-24 DIAGNOSIS — G20C Parkinsonism, unspecified: Secondary | ICD-10-CM | POA: Diagnosis present

## 2024-09-24 DIAGNOSIS — N1832 Chronic kidney disease, stage 3b: Secondary | ICD-10-CM | POA: Diagnosis present

## 2024-09-24 DIAGNOSIS — E1122 Type 2 diabetes mellitus with diabetic chronic kidney disease: Secondary | ICD-10-CM | POA: Diagnosis present

## 2024-09-24 DIAGNOSIS — F32A Depression, unspecified: Secondary | ICD-10-CM | POA: Diagnosis present

## 2024-09-24 DIAGNOSIS — Z8249 Family history of ischemic heart disease and other diseases of the circulatory system: Secondary | ICD-10-CM | POA: Diagnosis not present

## 2024-09-24 DIAGNOSIS — Y92009 Unspecified place in unspecified non-institutional (private) residence as the place of occurrence of the external cause: Secondary | ICD-10-CM | POA: Diagnosis not present

## 2024-09-24 DIAGNOSIS — I2694 Multiple subsegmental pulmonary emboli without acute cor pulmonale: Secondary | ICD-10-CM | POA: Diagnosis present

## 2024-09-24 DIAGNOSIS — S72141A Displaced intertrochanteric fracture of right femur, initial encounter for closed fracture: Secondary | ICD-10-CM | POA: Diagnosis present

## 2024-09-24 DIAGNOSIS — M7989 Other specified soft tissue disorders: Secondary | ICD-10-CM | POA: Diagnosis not present

## 2024-09-24 DIAGNOSIS — I130 Hypertensive heart and chronic kidney disease with heart failure and stage 1 through stage 4 chronic kidney disease, or unspecified chronic kidney disease: Secondary | ICD-10-CM | POA: Diagnosis present

## 2024-09-24 DIAGNOSIS — Z955 Presence of coronary angioplasty implant and graft: Secondary | ICD-10-CM | POA: Diagnosis not present

## 2024-09-24 DIAGNOSIS — Z87891 Personal history of nicotine dependence: Secondary | ICD-10-CM

## 2024-09-24 DIAGNOSIS — Z833 Family history of diabetes mellitus: Secondary | ICD-10-CM | POA: Diagnosis not present

## 2024-09-24 DIAGNOSIS — I4819 Other persistent atrial fibrillation: Secondary | ICD-10-CM | POA: Diagnosis present

## 2024-09-24 DIAGNOSIS — D509 Iron deficiency anemia, unspecified: Secondary | ICD-10-CM | POA: Diagnosis present

## 2024-09-24 DIAGNOSIS — Z7982 Long term (current) use of aspirin: Secondary | ICD-10-CM | POA: Diagnosis not present

## 2024-09-24 DIAGNOSIS — I5032 Chronic diastolic (congestive) heart failure: Secondary | ICD-10-CM | POA: Diagnosis present

## 2024-09-24 DIAGNOSIS — I4891 Unspecified atrial fibrillation: Secondary | ICD-10-CM | POA: Diagnosis not present

## 2024-09-24 DIAGNOSIS — I252 Old myocardial infarction: Secondary | ICD-10-CM | POA: Diagnosis not present

## 2024-09-24 DIAGNOSIS — S72144A Nondisplaced intertrochanteric fracture of right femur, initial encounter for closed fracture: Secondary | ICD-10-CM | POA: Diagnosis not present

## 2024-09-24 DIAGNOSIS — W010XXA Fall on same level from slipping, tripping and stumbling without subsequent striking against object, initial encounter: Secondary | ICD-10-CM | POA: Diagnosis present

## 2024-09-24 DIAGNOSIS — I2510 Atherosclerotic heart disease of native coronary artery without angina pectoris: Secondary | ICD-10-CM | POA: Diagnosis present

## 2024-09-24 DIAGNOSIS — D62 Acute posthemorrhagic anemia: Secondary | ICD-10-CM | POA: Diagnosis present

## 2024-09-24 DIAGNOSIS — E785 Hyperlipidemia, unspecified: Secondary | ICD-10-CM | POA: Diagnosis present

## 2024-09-24 DIAGNOSIS — Z8673 Personal history of transient ischemic attack (TIA), and cerebral infarction without residual deficits: Secondary | ICD-10-CM | POA: Diagnosis not present

## 2024-09-24 DIAGNOSIS — I2699 Other pulmonary embolism without acute cor pulmonale: Secondary | ICD-10-CM | POA: Diagnosis not present

## 2024-09-24 DIAGNOSIS — R079 Chest pain, unspecified: Secondary | ICD-10-CM | POA: Diagnosis present

## 2024-09-24 HISTORY — PX: INTRAMEDULLARY (IM) NAIL INTERTROCHANTERIC: SHX5875

## 2024-09-24 LAB — CBC WITH DIFFERENTIAL/PLATELET
Abs Immature Granulocytes: 0.05 K/uL (ref 0.00–0.07)
Abs Immature Granulocytes: 0.08 K/uL — ABNORMAL HIGH (ref 0.00–0.07)
Basophils Absolute: 0 K/uL (ref 0.0–0.1)
Basophils Absolute: 0 K/uL (ref 0.0–0.1)
Basophils Relative: 0 %
Basophils Relative: 0 %
Eosinophils Absolute: 0 K/uL (ref 0.0–0.5)
Eosinophils Absolute: 0.1 K/uL (ref 0.0–0.5)
Eosinophils Relative: 0 %
Eosinophils Relative: 1 %
HCT: 33.7 % — ABNORMAL LOW (ref 39.0–52.0)
HCT: 37.9 % — ABNORMAL LOW (ref 39.0–52.0)
Hemoglobin: 11.2 g/dL — ABNORMAL LOW (ref 13.0–17.0)
Hemoglobin: 12.4 g/dL — ABNORMAL LOW (ref 13.0–17.0)
Immature Granulocytes: 1 %
Immature Granulocytes: 1 %
Lymphocytes Relative: 7 %
Lymphocytes Relative: 7 %
Lymphs Abs: 0.7 K/uL (ref 0.7–4.0)
Lymphs Abs: 0.8 K/uL (ref 0.7–4.0)
MCH: 29.4 pg (ref 26.0–34.0)
MCH: 29.9 pg (ref 26.0–34.0)
MCHC: 32.7 g/dL (ref 30.0–36.0)
MCHC: 33.2 g/dL (ref 30.0–36.0)
MCV: 89.8 fL (ref 80.0–100.0)
MCV: 89.9 fL (ref 80.0–100.0)
Monocytes Absolute: 0.5 K/uL (ref 0.1–1.0)
Monocytes Absolute: 0.8 K/uL (ref 0.1–1.0)
Monocytes Relative: 5 %
Monocytes Relative: 8 %
Neutro Abs: 8.2 K/uL — ABNORMAL HIGH (ref 1.7–7.7)
Neutro Abs: 9.9 K/uL — ABNORMAL HIGH (ref 1.7–7.7)
Neutrophils Relative %: 84 %
Neutrophils Relative %: 86 %
Platelets: 235 K/uL (ref 150–400)
Platelets: 250 K/uL (ref 150–400)
RBC: 3.75 MIL/uL — ABNORMAL LOW (ref 4.22–5.81)
RBC: 4.22 MIL/uL (ref 4.22–5.81)
RDW: 14.5 % (ref 11.5–15.5)
RDW: 14.6 % (ref 11.5–15.5)
WBC: 11.4 K/uL — ABNORMAL HIGH (ref 4.0–10.5)
WBC: 9.7 K/uL (ref 4.0–10.5)
nRBC: 0 % (ref 0.0–0.2)
nRBC: 0 % (ref 0.0–0.2)

## 2024-09-24 LAB — BASIC METABOLIC PANEL WITH GFR
Anion gap: 16 — ABNORMAL HIGH (ref 5–15)
Anion gap: 10 (ref 5–15)
BUN: 28 mg/dL — ABNORMAL HIGH (ref 8–23)
BUN: 29 mg/dL — ABNORMAL HIGH (ref 8–23)
CO2: 21 mmol/L — ABNORMAL LOW (ref 22–32)
CO2: 26 mmol/L (ref 22–32)
Calcium: 9.2 mg/dL (ref 8.9–10.3)
Calcium: 8.9 mg/dL (ref 8.9–10.3)
Chloride: 100 mmol/L (ref 98–111)
Chloride: 103 mmol/L (ref 98–111)
Creatinine, Ser: 1.59 mg/dL — ABNORMAL HIGH (ref 0.61–1.24)
Creatinine, Ser: 1.54 mg/dL — ABNORMAL HIGH (ref 0.61–1.24)
GFR, Estimated: 44 mL/min — ABNORMAL LOW
GFR, Estimated: 46 mL/min — ABNORMAL LOW
Glucose, Bld: 182 mg/dL — ABNORMAL HIGH (ref 70–99)
Glucose, Bld: 182 mg/dL — ABNORMAL HIGH (ref 70–99)
Potassium: 3.7 mmol/L (ref 3.5–5.1)
Potassium: 4.1 mmol/L (ref 3.5–5.1)
Sodium: 138 mmol/L (ref 135–145)
Sodium: 139 mmol/L (ref 135–145)

## 2024-09-24 LAB — HEMOGLOBIN A1C
Hgb A1c MFr Bld: 6.8 % — ABNORMAL HIGH (ref 4.8–5.6)
Mean Plasma Glucose: 148.46 mg/dL

## 2024-09-24 LAB — GLUCOSE, CAPILLARY
Glucose-Capillary: 160 mg/dL — ABNORMAL HIGH (ref 70–99)
Glucose-Capillary: 149 mg/dL — ABNORMAL HIGH (ref 70–99)
Glucose-Capillary: 149 mg/dL — ABNORMAL HIGH (ref 70–99)
Glucose-Capillary: 182 mg/dL — ABNORMAL HIGH (ref 70–99)

## 2024-09-24 LAB — CBG MONITORING, ED
Glucose-Capillary: 172 mg/dL — ABNORMAL HIGH (ref 70–99)
Glucose-Capillary: 175 mg/dL — ABNORMAL HIGH (ref 70–99)

## 2024-09-24 LAB — TYPE AND SCREEN
ABO/RH(D): O POS
Antibody Screen: NEGATIVE

## 2024-09-24 LAB — PROTIME-INR
INR: 1 (ref 0.8–1.2)
Prothrombin Time: 13.5 s (ref 11.4–15.2)

## 2024-09-24 LAB — PHOSPHORUS: Phosphorus: 4.4 mg/dL (ref 2.5–4.6)

## 2024-09-24 LAB — MAGNESIUM: Magnesium: 1.9 mg/dL (ref 1.7–2.4)

## 2024-09-24 MED ORDER — INSULIN ASPART 100 UNIT/ML IJ SOLN
0.0000 [IU] | INTRAMUSCULAR | Status: DC
  Administered 2024-09-24 – 2024-09-25 (×4): 2 [IU] via SUBCUTANEOUS
  Administered 2024-09-25: 1 [IU] via SUBCUTANEOUS
  Administered 2024-09-25 (×2): 2 [IU] via SUBCUTANEOUS
  Filled 2024-09-24 (×4): qty 2
  Filled 2024-09-24 (×3): qty 1

## 2024-09-24 MED ORDER — FENTANYL CITRATE (PF) 100 MCG/2ML IJ SOLN
25.0000 ug | INTRAMUSCULAR | Status: DC | PRN
  Administered 2024-09-24 (×2): 50 ug via INTRAVENOUS

## 2024-09-24 MED ORDER — MELATONIN 5 MG PO TABS
5.0000 mg | ORAL_TABLET | Freq: Every evening | ORAL | Status: DC | PRN
  Administered 2024-09-25 – 2024-09-30 (×3): 5 mg via ORAL
  Filled 2024-09-24 (×3): qty 1

## 2024-09-24 MED ORDER — FENTANYL CITRATE (PF) 100 MCG/2ML IJ SOLN
INTRAMUSCULAR | Status: AC
  Filled 2024-09-24: qty 2

## 2024-09-24 MED ORDER — ONDANSETRON HCL 4 MG/2ML IJ SOLN: 4.0000 mg | Freq: Four times a day (QID) | INTRAMUSCULAR | Status: DC | PRN

## 2024-09-24 MED ORDER — PHENYLEPHRINE HCL-NACL 20-0.9 MG/250ML-% IV SOLN
INTRAVENOUS | Status: DC | PRN
  Administered 2024-09-24: 25 ug/min via INTRAVENOUS

## 2024-09-24 MED ORDER — GLUCERNA SHAKE PO LIQD
237.0000 mL | Freq: Two times a day (BID) | ORAL | Status: DC
  Administered 2024-09-25 – 2024-10-02 (×6): 237 mL via ORAL

## 2024-09-24 MED ORDER — HYDROMORPHONE HCL 1 MG/ML IJ SOLN
INTRAMUSCULAR | Status: AC
  Filled 2024-09-24: qty 1

## 2024-09-24 MED ORDER — ONDANSETRON HCL 4 MG/2ML IJ SOLN
INTRAMUSCULAR | Status: DC | PRN
  Administered 2024-09-24: 4 mg via INTRAVENOUS

## 2024-09-24 MED ORDER — VANCOMYCIN HCL 500 MG IV SOLR
INTRAVENOUS | Status: AC
  Filled 2024-09-24: qty 10

## 2024-09-24 MED ORDER — VANCOMYCIN HCL 500 MG IV SOLR
INTRAVENOUS | Status: DC | PRN
  Administered 2024-09-24: 500 mg

## 2024-09-24 MED ORDER — CARBIDOPA-LEVODOPA 10-100 MG PO TABS
1.0000 | ORAL_TABLET | Freq: Three times a day (TID) | ORAL | Status: DC
  Administered 2024-09-24 – 2024-10-02 (×24): 1 via ORAL
  Filled 2024-09-24 (×29): qty 1

## 2024-09-24 MED ORDER — OXYCODONE HCL 5 MG PO TABS: 5.0000 mg | ORAL_TABLET | Freq: Once | ORAL | Status: DC | PRN

## 2024-09-24 MED ORDER — HYDROMORPHONE HCL 1 MG/ML IJ SOLN
0.5000 mg | INTRAMUSCULAR | Status: DC | PRN
  Administered 2024-09-24 (×4): 0.5 mg via INTRAVENOUS
  Filled 2024-09-24 (×4): qty 0.5

## 2024-09-24 MED ORDER — OXYCODONE HCL 5 MG/5ML PO SOLN: 5.0000 mg | Freq: Once | ORAL | Status: DC | PRN

## 2024-09-24 MED ORDER — TRANEXAMIC ACID-NACL 1000-0.7 MG/100ML-% IV SOLN
INTRAVENOUS | Status: AC
  Filled 2024-09-24: qty 100

## 2024-09-24 MED ORDER — HYDROMORPHONE HCL 1 MG/ML IJ SOLN
INTRAMUSCULAR | Status: DC | PRN
  Administered 2024-09-24 (×2): .25 mg via INTRAVENOUS

## 2024-09-24 MED ORDER — EPHEDRINE 5 MG/ML INJ
INTRAVENOUS | Status: AC
  Filled 2024-09-24: qty 5

## 2024-09-24 MED ORDER — HYDROMORPHONE HCL 1 MG/ML IJ SOLN
1.0000 mg | INTRAMUSCULAR | Status: AC
  Administered 2024-09-24: 1 mg via INTRAVENOUS
  Filled 2024-09-24: qty 1

## 2024-09-24 MED ORDER — ACETAMINOPHEN 500 MG PO TABS: 500.0000 mg | ORAL_TABLET | Freq: Four times a day (QID) | ORAL | Status: DC | PRN

## 2024-09-24 MED ORDER — ATORVASTATIN CALCIUM 80 MG PO TABS
80.0000 mg | ORAL_TABLET | Freq: Every day | ORAL | Status: DC
  Administered 2024-09-24 – 2024-10-02 (×9): 80 mg via ORAL
  Filled 2024-09-24 (×7): qty 1
  Filled 2024-09-24: qty 2
  Filled 2024-09-24: qty 1

## 2024-09-24 MED ORDER — TRANEXAMIC ACID-NACL 1000-0.7 MG/100ML-% IV SOLN: 1000.0000 mg | Freq: Once | INTRAVENOUS | Status: DC

## 2024-09-24 MED ORDER — HYDRALAZINE HCL 50 MG PO TABS
50.0000 mg | ORAL_TABLET | Freq: Three times a day (TID) | ORAL | Status: DC
  Administered 2024-09-24 – 2024-10-02 (×22): 50 mg via ORAL
  Filled 2024-09-24 (×24): qty 1

## 2024-09-24 MED ORDER — HYDROMORPHONE HCL 1 MG/ML IJ SOLN
0.2500 mg | INTRAMUSCULAR | Status: DC | PRN
  Administered 2024-09-24: 0.5 mg via INTRAVENOUS

## 2024-09-24 MED ORDER — INFLUENZA VAC SPLIT HIGH-DOSE 0.5 ML IM SUSY
0.5000 mL | PREFILLED_SYRINGE | INTRAMUSCULAR | Status: AC
  Administered 2024-09-26: 0.5 mL via INTRAMUSCULAR
  Filled 2024-09-24: qty 0.5

## 2024-09-24 MED ORDER — CEFAZOLIN SODIUM-DEXTROSE 2-4 GM/100ML-% IV SOLN
2.0000 g | Freq: Three times a day (TID) | INTRAVENOUS | Status: DC
  Administered 2024-09-24 – 2024-09-25 (×2): 2 g via INTRAVENOUS
  Filled 2024-09-24 (×2): qty 100

## 2024-09-24 MED ORDER — PROPOFOL 10 MG/ML IV BOLUS
INTRAVENOUS | Status: AC
  Filled 2024-09-24: qty 20

## 2024-09-24 MED ORDER — DEXAMETHASONE SOD PHOSPHATE PF 10 MG/ML IJ SOLN
INTRAMUSCULAR | Status: DC | PRN
  Administered 2024-09-24: 4 mg via INTRAVENOUS

## 2024-09-24 MED ORDER — PROCHLORPERAZINE EDISYLATE 10 MG/2ML IJ SOLN
5.0000 mg | Freq: Four times a day (QID) | INTRAMUSCULAR | Status: DC | PRN
  Administered 2024-09-27: 5 mg via INTRAVENOUS
  Filled 2024-09-24: qty 2

## 2024-09-24 MED ORDER — CEFAZOLIN SODIUM-DEXTROSE 2-4 GM/100ML-% IV SOLN
2.0000 g | INTRAVENOUS | Status: AC
  Administered 2024-09-24: 2 g via INTRAVENOUS
  Filled 2024-09-24: qty 100

## 2024-09-24 MED ORDER — ALLOPURINOL 100 MG PO TABS
100.0000 mg | ORAL_TABLET | Freq: Two times a day (BID) | ORAL | Status: DC
  Administered 2024-09-24 – 2024-10-02 (×18): 100 mg via ORAL
  Filled 2024-09-24 (×18): qty 1

## 2024-09-24 MED ORDER — 0.9 % SODIUM CHLORIDE (POUR BTL) OPTIME
TOPICAL | Status: DC | PRN
  Administered 2024-09-24: 1000 mL

## 2024-09-24 MED ORDER — LOSARTAN POTASSIUM 50 MG PO TABS
50.0000 mg | ORAL_TABLET | Freq: Every day | ORAL | Status: DC
  Administered 2024-09-25 – 2024-09-30 (×6): 50 mg via ORAL
  Filled 2024-09-24 (×5): qty 1
  Filled 2024-09-24: qty 2
  Filled 2024-09-24: qty 1

## 2024-09-24 MED ORDER — HYDROMORPHONE HCL 1 MG/ML IJ SOLN: 0.5000 mg | INTRAMUSCULAR | Status: AC | PRN

## 2024-09-24 MED ORDER — HYDROMORPHONE HCL 1 MG/ML IJ SOLN
INTRAMUSCULAR | Status: AC
  Filled 2024-09-24: qty 0.5

## 2024-09-24 MED ORDER — FENTANYL CITRATE (PF) 100 MCG/2ML IJ SOLN
50.0000 ug | Freq: Once | INTRAMUSCULAR | Status: AC
  Administered 2024-09-24: 50 ug via INTRAVENOUS

## 2024-09-24 MED ORDER — LACTATED RINGERS IV SOLN: INTRAVENOUS | Status: DC

## 2024-09-24 MED ORDER — PANTOPRAZOLE SODIUM 40 MG PO TBEC
40.0000 mg | DELAYED_RELEASE_TABLET | Freq: Two times a day (BID) | ORAL | Status: DC
  Administered 2024-09-24 – 2024-10-02 (×18): 40 mg via ORAL
  Filled 2024-09-24 (×18): qty 1

## 2024-09-24 MED ORDER — OXYCODONE HCL 5 MG PO TABS
5.0000 mg | ORAL_TABLET | ORAL | Status: DC | PRN
  Administered 2024-09-24 – 2024-10-01 (×22): 10 mg via ORAL
  Filled 2024-09-24 (×23): qty 2

## 2024-09-24 MED ORDER — ACETAMINOPHEN 500 MG PO TABS
1000.0000 mg | ORAL_TABLET | Freq: Three times a day (TID) | ORAL | Status: DC
  Administered 2024-09-26 – 2024-10-02 (×18): 1000 mg via ORAL
  Filled 2024-09-24 (×20): qty 2

## 2024-09-24 MED ORDER — OXYCODONE HCL 5 MG PO TABS
5.0000 mg | ORAL_TABLET | ORAL | Status: DC | PRN
  Administered 2024-09-24: 5 mg via ORAL
  Filled 2024-09-24: qty 1

## 2024-09-24 MED ORDER — ROCURONIUM BROMIDE 10 MG/ML (PF) SYRINGE
PREFILLED_SYRINGE | INTRAVENOUS | Status: DC | PRN
  Administered 2024-09-24: 50 mg via INTRAVENOUS

## 2024-09-24 MED ORDER — SUGAMMADEX SODIUM 200 MG/2ML IV SOLN
INTRAVENOUS | Status: DC | PRN
  Administered 2024-09-24: 167.8 mg via INTRAVENOUS

## 2024-09-24 MED ORDER — PHENYLEPHRINE 80 MCG/ML (10ML) SYRINGE FOR IV PUSH (FOR BLOOD PRESSURE SUPPORT)
PREFILLED_SYRINGE | INTRAVENOUS | Status: AC
  Filled 2024-09-24: qty 10

## 2024-09-24 MED ORDER — LIDOCAINE 2% (20 MG/ML) 5 ML SYRINGE
INTRAMUSCULAR | Status: AC
  Filled 2024-09-24: qty 5

## 2024-09-24 MED ORDER — FENTANYL CITRATE (PF) 250 MCG/5ML IJ SOLN
INTRAMUSCULAR | Status: AC
  Filled 2024-09-24: qty 5

## 2024-09-24 MED ORDER — ONDANSETRON HCL 4 MG/2ML IJ SOLN
INTRAMUSCULAR | Status: AC
  Filled 2024-09-24: qty 2

## 2024-09-24 MED ORDER — HYDROMORPHONE HCL 1 MG/ML IJ SOLN
0.5000 mg | Freq: Once | INTRAMUSCULAR | Status: AC
  Administered 2024-09-24: 0.5 mg via INTRAVENOUS

## 2024-09-24 MED ORDER — POLYETHYLENE GLYCOL 3350 17 G PO PACK
17.0000 g | PACK | Freq: Every day | ORAL | Status: DC | PRN
  Administered 2024-09-27 – 2024-09-28 (×2): 17 g via ORAL
  Filled 2024-09-24 (×2): qty 1

## 2024-09-24 MED ORDER — LIDOCAINE 2% (20 MG/ML) 5 ML SYRINGE
INTRAMUSCULAR | Status: DC | PRN
  Administered 2024-09-24: 50 mg via INTRAVENOUS

## 2024-09-24 MED ORDER — TRANEXAMIC ACID-NACL 1000-0.7 MG/100ML-% IV SOLN
1000.0000 mg | INTRAVENOUS | Status: AC
  Administered 2024-09-24: 1000 mg via INTRAVENOUS

## 2024-09-24 MED ORDER — PHENYLEPHRINE 80 MCG/ML (10ML) SYRINGE FOR IV PUSH (FOR BLOOD PRESSURE SUPPORT)
PREFILLED_SYRINGE | INTRAVENOUS | Status: DC | PRN
  Administered 2024-09-24 (×2): 80 ug via INTRAVENOUS

## 2024-09-24 MED ORDER — CEFAZOLIN SODIUM-DEXTROSE 2-4 GM/100ML-% IV SOLN
2.0000 g | INTRAVENOUS | Status: AC
  Administered 2024-09-24: 2 g via INTRAVENOUS

## 2024-09-24 MED ORDER — FENTANYL CITRATE (PF) 250 MCG/5ML IJ SOLN
INTRAMUSCULAR | Status: DC | PRN
  Administered 2024-09-24 (×5): 50 ug via INTRAVENOUS

## 2024-09-24 MED ORDER — ROCURONIUM BROMIDE 10 MG/ML (PF) SYRINGE
PREFILLED_SYRINGE | INTRAVENOUS | Status: AC
  Filled 2024-09-24: qty 10

## 2024-09-24 MED ORDER — EZETIMIBE 10 MG PO TABS
10.0000 mg | ORAL_TABLET | Freq: Every day | ORAL | Status: DC
  Administered 2024-09-24 – 2024-10-02 (×9): 10 mg via ORAL
  Filled 2024-09-24 (×9): qty 1

## 2024-09-24 MED ORDER — PROPOFOL 10 MG/ML IV BOLUS
INTRAVENOUS | Status: DC | PRN
  Administered 2024-09-24: 20 mg via INTRAVENOUS
  Administered 2024-09-24: 100 mg via INTRAVENOUS
  Administered 2024-09-24: 20 mg via INTRAVENOUS

## 2024-09-24 NOTE — ED Notes (Signed)
 Report given to PACU RN at [facility]. No questions of her. Did explain that pt has already left with [facility] heading her way.

## 2024-09-24 NOTE — Consult Note (Signed)
 Orthopedic Surgery Consult Note  Assessment: Patient is a 79 y.o. male with right peritrochanteric hip fracture   Plan: -Tentatively planning for operative fixation 1/01 at [facility] -Diet: NPO except sips for medication -DVT ppx: can resume home plavix  post-op -Ancef  and TXA on call to OR -Weight bearing status: NWB RLE -PT evaluate and treat -Pain control -Dispo: pending completion of operative plans   Discussed recommendation for operative intervention in the form of right peritrochanteric hip fracture open reduction internal fixation with cephalomedullary rod. Explained the risks of this procedure included, but were not limited to: nonunion, malunion, hardware failure/malposition, infection, bleeding, stiffness, screw cut out, need for additional procedures, deep vein thrombosis, pulmonary embolism, and death. The benefits of this procedure would be to promote fracture healing by providing stability and to allow for early mobilization. The alternatives of this surgery would be to treat the fracture with immobilization in a traction  or to do no intervention. The patient's questions were answered to his satisfaction. After this discussion, patient elected to proceed with surgery. Informed consent was obtained.   ___________________________________________________________________________   Reason for consult: right peritrochanteric femur fracture  History:  Patient is a 79 y.o. male who had a fall at home and landed on his right hip. Noted immediate onset of right hip pain. He was unable to weight bear. He was brought to [facility] ER where he was found to have a right peritrochanteric hip fracture. The local orthopedist was consulted who felt the patient could not be managed at [facility]. He was transferred to [facility] with orthopedics consulted. He is reporting right hip pain. No pain elsewhere.   PMHx: CHF, history of CVA, DM, HLD, CAD, HTN, history of MI, atrial fibrillation    Allergies: NKDA  PSHx: Watchman procedure, atrial fibrillation ablation, coronary atherectomy, cataract surgery, right tennis elbow surgery, coronary stent placement, tonsillectomy, mesenteric artery bypass,   Fhx: non contributory to hip fracture  Shx: no active nicotine use, alcohol use of about 4-5 drinks per week, denies recreational drug use    Physical Exam:  General: no acute distress, appears stated age Neurologic: alert, answering questions appropriately, following commands Cardiovascular: regular rate, no cyanosis Respiratory: unlabored breathing on room air, symmetric chest rise Psychiatric: appropriate affect, normal cadence to speech  MSK:   -Bilateral upper extremities  No tenderness to palpation over extremity, no gross deformity, no open wounds Fires deltoid, biceps, triceps, wrist extensors, wrist flexors, finger extensors, finger flexors  AIN/PIN/IO intact  Palpable radial pulse  Sensation intact to light touch in median/ulnar/radial/axillary nerve distributions  Hand warm and well perfused  -Left lower extremity   No tenderness to palpation over extremity, no gross deformity, no open wounds, no pain with log roll Fires hip flexors, quadriceps, hamstrings, tibialis anterior, gastrocnemius and soleus, extensor hallucis longus Plantarflexes and dorsiflexes toes Sensation intact to light touch in sural, saphenous, tibial, deep peroneal, and superficial peroneal nerve distributions Foot warm and well perfused  -Right lower extremity  No tenderness to palpation over extremity, except around the hip. Pain with log roll. Leg shorter when compared to contralateral side. No open wounds seen. Does not fire hip flexors due to pain. Fires quadriceps, hamstrings, tibialis anterior, gastrocnemius and soleus, extensor hallucis longus Plantarflexes and dorsiflexes toes Sensation intact to light touch in sural, saphenous, tibial, deep peroneal, and superficial peroneal  nerve distributions Foot warm and well perfused  Imaging: XRs of the right hip from 09/23/2024 were independently reviewed and interpreted, showing displaced and shortened  right peritrochanteric femur fracture. Fracture in varus alignment with medial translation. No other fracture seen. No dislocation seen.    Patient name: Connor Bryant Patient MRN: [id number] Date: 09/24/2024

## 2024-09-24 NOTE — Discharge Instructions (Addendum)
 Orthopedic Surgery Discharge Instructions  Patient name: Connor Bryant Fracture: right peritrochanteric femur fracture Procedure Performed: right peritrochanteric femur fracture cephalomedullary rodding Date of Surgery: 09/24/2024 Surgeon: Ozell Ada, MD  Activity: You are allowed to put as much weight on your leg as you would like. You can walk as much as you would like. You can perform household activities such as cleaning dishes, doing laundry, vacuuming, etc.  Incision Care: Your incision site has a dressing over it. That dressing should remain in place and dry at all times for a total of one week after surgery. After one week, you can remove the dressing. Underneath the dressing, you will find skin staples. You should leave these staples in place. They will be taken out in the office when the wound has healed. Do not pick, rub, or scrub at them. Do not put cream or lotion over the surgical area. After one week and once the dressing is off, it is okay to let soap and water run over your incision. Again, do not pick, scrub, or rub at the staples when bathing. Do not submerge (e.g., take a bath, swim, go in a hot tub, etc.) until six weeks after surgery. There may be some bloody drainage from the incision into the dressing after surgery. This is normal. You do not need to replace the dressing. Continue to leave it in place for the one week as instructed above. Should the dressing become saturated with blood or drainage, please call the office for further instructions.   Medications: You have been prescribed oxycodone . This is a narcotic pain medication and should only be taken as prescribed. You should not drink alcohol or operate heavy machinery (including driving) while taking this medication. The oxycodone  can cause constipation as a side effect. For that reason, you have been prescribed senna and miralax . These are both laxatives. You do not need to take this medication if you develop diarrhea.  Should you remain constipated even while taking the senna and miralax , please use the miralax  twice daily. Tylenol  has been prescribed to be taken every 8 hours, which will give you additional pain relief.    You may resume any home blood thinners (warfarin, lovenox , apixaban , plavix , xarelto, etc) after your surgery. Take these medications as they were previously prescribed.  In order to set expectations for opioid prescriptions, you will only be prescribed opioids for a total of six weeks after surgery and, at two-weeks after surgery, your opioid prescription will start to tapered (decreased dosage and number of pills). If you have ongoing need for opioid medication six weeks after surgery, you will be referred to pain management. If you are already established with a provider that is giving you opioid medications, you should schedule an appointment with them for six weeks after surgery if you feel you are going to need another prescription. State law only allows for opioid prescriptions one week at a time. If you are running out of opioid medication near the end of the week, please call the office during business hours before running out so I can send you another prescription.   Driving: You should not drive while taking narcotic pain medications. You should start getting back to driving slowly and you may want to try driving in a parking lot before doing anything more.   Diet: You are safe to resume your regular diet after surgery.   Reasons to Call the Office After Surgery: You should feel free to call the office with any concerns  or questions you have in the post-operative period, but you should definitely notify the office if you develop: -shortness of breath, chest pain, or trouble breathing -excessive bleeding, drainage, redness, or swelling around the surgical site -fevers, chills, or pain that is getting worse with each passing day -persistent nausea or vomiting -new weakness in the right  lower extremity, new or worsening numbness or tingling in the right lower extremity -other concerns about your surgery  Follow Up Appointments: You have a follow up appointment scheduled with Dr. Georgina on 10/14/2024 at 3:45pm. The office location and phone number are listed below. Please arrive on time to your appointment.   Office Information:  -Ozell Georgina, MD -Phone number: [phone] -Address: [address]       [place], [place] [address]    Information on my medicine - ELIQUIS  (apixaban )  Why was Eliquis  prescribed for you? Eliquis  was prescribed to treat blood clots that may have been found in the veins of your legs (deep vein thrombosis) or in your lungs (pulmonary embolism) and to reduce the risk of them occurring again.  What do You need to know about Eliquis  ? The starting dose is 10 mg (two 5 mg tablets) taken TWICE daily for the FIRST SEVEN (7) DAYS, then on  10/09/24  the dose is reduced to ONE 5 mg tablet taken TWICE daily.  Eliquis  may be taken with or without food.   Try to take the dose about the same time in the morning and in the evening. If you have difficulty swallowing the tablet whole please discuss with your pharmacist how to take the medication safely.  Take Eliquis  exactly as prescribed and DO NOT stop taking Eliquis  without talking to the doctor who prescribed the medication.  Stopping may increase your risk of developing a new blood clot.  Refill your prescription before you run out.  After discharge, you should have regular check-up appointments with your healthcare provider that is prescribing your Eliquis .    What do you do if you miss a dose? If a dose of ELIQUIS  is not taken at the scheduled time, take it as soon as possible on the same day and twice-daily administration should be resumed. The dose should not be doubled to make up for a missed dose.  Important Safety Information A possible side effect of Eliquis  is bleeding. You should  call your healthcare provider right away if you experience any of the following: Bleeding from an injury or your nose that does not stop. Unusual colored urine (red or dark brown) or unusual colored stools (red or black). Unusual bruising for unknown reasons. A serious fall or if you hit your head (even if there is no bleeding).  Some medicines may interact with Eliquis  and might increase your risk of bleeding or clotting while on Eliquis . To help avoid this, consult your healthcare provider or pharmacist prior to using any new prescription or non-prescription medications, including herbals, vitamins, non-steroidal anti-inflammatory drugs (NSAIDs) and supplements.  This website has more information on Eliquis  (apixaban ): http://[email or web address] .com/eliquis dena

## 2024-09-24 NOTE — H&P (Addendum)
 " History and Physical  [name]:[id number] DOB: [birth year] DOA: 09/23/2024  Referring physician: dr. Midge, EDP  PCP: Renato Dorothey [name], NP  Outpatient Specialists: Cardiology. Patient coming from: Home via EMS.  Chief Complaint: Fall.  HPI: Connor Bryant is a 79 y.o. male with medical history significant for multivessel coronary artery disease status post PCI with stenting on Plavix , persistent atrial fibrillation post Watchman procedure no longer on Eliquis , history of GI bleed and epistaxis, chronic HFpEF, type 2 diabetes, who presents to the ER after a mechanical fall.  He was trying to put away the Christmas tree lights away.  He tripped on them and fell.  He landed on his right hip.  EMS was activated.  The patient received 100 mcg of IV fentanyl  en route.  In the ER, hypertensive and tachypneic.  Chest x-ray was nonacute.  Right hip x-ray revealed comminuted intertrochanteric fracture of the right hip with varus angulation.  EDP discussed the case with orthopedic surgery, Dr. Georgina, who recommended admission by the medicine team and transfer to [facility] for surgical repair.  Admitted by [facility], hospitalist service.  ED Course: Temperature 98.1.  BP 164/89, pulse 96, respiratory rate 22, O2 saturation 98% on room air.  Review of Systems: Review of systems as noted in the HPI. All other systems reviewed and are negative.   Past Medical History:  Diagnosis Date   Acute CVA (cerebrovascular accident) (HCC) 05/01/2020   Acute respiratory failure with hypoxia (HCC) 05/04/2020   Arthritis    Atrial fibrillation (HCC)    DM type 2 (diabetes mellitus, type 2) (HCC)    no meds   Endotracheally intubated    Gallstone    Heart murmur    was told one time he had a murmur, not since 30 years ago   HTN (hypertension)    Hyperlipidemia    Myocardial infarct, old    2021   Personal history of colonic polyp-adenoma 11/24/2013   Presence of Watchman left atrial  appendage closure device 07/23/2024   27 mm Device, JP   Renal insufficiency    mild   Thoracic spine fracture [facility])    Past Surgical History:  Procedure Laterality Date   ABDOMINAL AORTAGRAM N/A 02/23/2014   Procedure: ABDOMINAL EZELLA;  Surgeon: Redell LITTIE Door, MD;  Location: [facility] CATH LAB;  Service: Cardiovascular;  Laterality: N/A;   ATRIAL FIBRILLATION ABLATION N/A 05/11/2024   Procedure: ATRIAL FIBRILLATION ABLATION;  Surgeon: Kennyth Chew, MD;  Location: [facility] INVASIVE CV LAB;  Service: Cardiovascular;  Laterality: N/A;   BIOPSY  08/20/2023   Procedure: BIOPSY;  Surgeon: Federico Rosario JAYSON, MD;  Location: [facility] ENDOSCOPY;  Service: Gastroenterology;;   CATARACT EXTRACTION, BILATERAL     COLONOSCOPY     CORONARY ATHERECTOMY N/A 09/16/2023   Procedure: CORONARY ATHERECTOMY;  Surgeon: Jordan, Peter M, MD;  Location: [facility] INVASIVE CV LAB;  Service: Cardiovascular;  Laterality: N/A;   CORONARY LITHOTRIPSY N/A 09/16/2023   Procedure: CORONARY LITHOTRIPSY;  Surgeon: Jordan, Peter M, MD;  Location: [facility] INVASIVE CV LAB;  Service: Cardiovascular;  Laterality: N/A;   CORONARY STENT INTERVENTION N/A 09/16/2023   Procedure: CORONARY STENT INTERVENTION;  Surgeon: Jordan, Peter M, MD;  Location: [facility] INVASIVE CV LAB;  Service: Cardiovascular;  Laterality: N/A;   CORONARY ULTRASOUND/IVUS N/A 09/16/2023   Procedure: Coronary Ultrasound/IVUS;  Surgeon: Jordan, Peter M, MD;  Location: [facility] INVASIVE CV LAB;  Service: Cardiovascular;  Laterality: N/A;   DEBRIDEMENT TENNIS ELBOW Right    ESOPHAGOGASTRODUODENOSCOPY  ESOPHAGOGASTRODUODENOSCOPY N/A 11/30/2023   Procedure: EGD (ESOPHAGOGASTRODUODENOSCOPY);  Surgeon: Eartha Flavors, Toribio, MD;  Location: [facility] ENDO SUITE;  Service: Gastroenterology;  Laterality: N/A;   ESOPHAGOGASTRODUODENOSCOPY (EGD) WITH PROPOFOL  N/A 08/20/2023   Procedure: ESOPHAGOGASTRODUODENOSCOPY (EGD) WITH PROPOFOL ;  Surgeon: Federico Rosario BROCKS, MD;  Location: [facility] ENDOSCOPY;  Service: Gastroenterology;   Laterality: N/A;   LEFT ATRIAL APPENDAGE OCCLUSION N/A 07/23/2024   Procedure: LEFT ATRIAL APPENDAGE OCCLUSION;  Surgeon: Kennyth Chew, MD;  Location: [facility] INVASIVE CV LAB;  Service: Cardiovascular;  Laterality: N/A;   MESENTERIC ARTERY BYPASS N/A 03/10/2014   Procedure: AORTO TO SUPERIOR MESENTERIC ARTERY BYPASS;  Surgeon: Lynwood JONETTA Collum, MD;  Location[facility];  Service: Vascular;  Laterality: N/A;   RIGHT/LEFT HEART CATH AND CORONARY ANGIOGRAPHY N/A 09/11/2023   Procedure: RIGHT/LEFT HEART CATH AND CORONARY ANGIOGRAPHY;  Surgeon: Rolan Ezra RAMAN, MD;  Location: [facility] INVASIVE CV LAB;  Service: Cardiovascular;  Laterality: N/A;   TONSILLECTOMY AND ADENOIDECTOMY     TRANSESOPHAGEAL ECHOCARDIOGRAM (CATH LAB) N/A 07/23/2024   Procedure: TRANSESOPHAGEAL ECHOCARDIOGRAM;  Surgeon: Kennyth Chew, MD;  Location: [facility] INVASIVE CV LAB;  Service: Cardiovascular;  Laterality: N/A;   vascualr surgery     illiac stents    Social History:  reports that he quit smoking about 15 years ago. His smoking use included cigarettes. He has been exposed to tobacco smoke. He has never used smokeless tobacco. He reports current alcohol use of about 5.0 standard drinks of alcohol per week. He reports that he does not currently use drugs.   Allergies[1]  Family History  Problem Relation Age of Onset   Diabetes Mother    Heart disease Mother        before age 6   Hyperlipidemia Mother    Hypertension Mother    AAA (abdominal aortic aneurysm) Mother    Stroke Father    Hypertension Father    Coronary artery disease Father    Hyperlipidemia Father    Diabetes Father    Bladder Cancer Father    Cancer Father    Heart disease Father    Heart attack Father    Colon cancer Neg Hx    Throat cancer Neg Hx    Kidney disease Neg Hx    Liver disease Neg Hx       Prior to Admission medications  Medication Sig Start Date End Date Taking? Authorizing Provider  Accu-Chek Softclix Lancets lancets SMARTSIG:Topical 1-3  Times Daily 06/25/23   [provider]  acetaminophen  (TYLENOL ) 325 MG tablet Take 2 tablets (650 mg total) by mouth every 4 (four) hours as needed for headache or mild pain (pain score 1-3). 07/24/24   Lesia Ozell Barter, PA-C  allopurinol  (ZYLOPRIM ) 100 MG tablet Take 100 mg by mouth 2 (two) times daily.    [provider]  atorvastatin  (LIPITOR ) 80 MG tablet Take 1 tablet (80 mg total) by mouth daily. 10/15/23   Zollie Lowers, MD  Blood Glucose Monitoring Suppl (ACCU-CHEK GUIDE) w/Device KIT CHECK BLOOD SUGAR UP TO THREE TIMES DAILY AS DIRECTED 06/25/23   [provider]  Blood Glucose Monitoring Suppl DEVI 1 each by Does not apply route in the morning, at noon, and at bedtime. May substitute to any manufacturer covered by patient's insurance. 06/25/23   Zollie Lowers, MD  Calcium  Carb-Cholecalciferol (CALCIUM  500 + D PO) Take 1 tablet by mouth daily.    [provider]  carbidopa -levodopa  (SINEMET ) 10-100 MG tablet Take 1 tablet by mouth 3 (three) times daily. For parkinsonism 01/06/24  Zollie Lowers, MD  clopidogrel  (PLAVIX ) 75 MG tablet Take 1 tablet (75 mg total) by mouth daily. 09/03/24   Lesia Ozell Barter, PA-C  dapagliflozin  propanediol (FARXIGA ) 10 MG TABS tablet Take 1 tablet (10 mg total) by mouth daily. 02/03/24   Milford, Harlene [name], FNP  ezetimibe  (ZETIA ) 10 MG tablet Take 1 tablet (10 mg total) by mouth daily. 08/25/24 11/23/24  Rolan Ezra RAMAN, MD  ferrous sulfate  325 (65 FE) MG tablet Take 1 tablet (325 mg total) by mouth daily. 09/28/23   Lee, Jordan, NP  hydrALAZINE  (APRESOLINE ) 100 MG tablet Take 1 tablet (100 mg total) by mouth 3 (three) times daily. 05/26/24   Milford, Harlene [name], FNP  isosorbide  mononitrate (IMDUR ) 30 MG 24 hr tablet Take 1 tablet (30 mg total) by mouth daily. 05/26/24 08/24/24  Glena Harlene [name], FNP  losartan  (COZAAR ) 50 MG tablet Take 50 mg by mouth daily. 06/03/24   [provider]  pantoprazole  (PROTONIX ) 40 MG  tablet TAKE ONE TABLET TWICE DAILY 03/30/24   Zollie Lowers, MD    Physical Exam: BP (!) 164/89 (BP Location: Left Arm)   Pulse 96   Temp 98.1 F (36.7 C) (Oral)   Resp (!) 22   Ht 5' 10 (1.778 m)   Wt 83.9 kg   SpO2 98%   BMI 26.54 kg/m   General: 79 y.o. year-old male well developed well nourished in no acute distress.  Alert and oriented x3. Cardiovascular: Regular rate and rhythm with no rubs or gallops.  No thyromegaly or JVD noted.  No lower extremity edema. 2/4 pulses in all 4 extremities. Respiratory: Clear to auscultation with no wheezes or rales. Good inspiratory effort. Abdomen: Soft nontender nondistended with normal bowel sounds x4 quadrants. Muskuloskeletal: No cyanosis, clubbing or edema noted bilaterally Neuro: CN II-XII intact, strength, sensation, reflexes Skin: No ulcerative lesions noted or rashes Psychiatry: Judgement and insight appear normal. Mood is appropriate for condition and setting          Labs on Admission:  Basic Metabolic Panel: Recent Labs  Lab 09/24/24 0024  NA 138  K 3.7  CL 100  CO2 21*  GLUCOSE 182*  BUN 28*  CREATININE 1.59*  CALCIUM  9.2   Liver Function Tests: No results for input(s): AST, ALT, ALKPHOS, BILITOT, PROT, ALBUMIN  in the last 168 hours. No results for input(s): LIPASE, AMYLASE in the last 168 hours. No results for input(s): AMMONIA in the last 168 hours. CBC: Recent Labs  Lab 09/24/24 0024  WBC 11.4*  NEUTROABS 9.9*  HGB 12.4*  HCT 37.9*  MCV 89.8  PLT 250   Cardiac Enzymes: No results for input(s): CKTOTAL, CKMB, CKMBINDEX, TROPONINI in the last 168 hours.  BNP (last 3 results) Recent Labs    01/13/24 1548 02/03/24 1540 05/26/24 1401  BNP 358.5* 426.2* 419.2*    ProBNP (last 3 results) No results for input(s): PROBNP in the last 8760 hours.  CBG: No results for input(s): GLUCAP in the last 168 hours.  Radiological Exams on Admission: DG Hip Unilat  With Pelvis  2-3 Views Right Result Date: 09/24/2024 EXAM: 2 or more VIEW(S) XRAY OF THE RIGHT HIP 09/24/2024 12:09:00 AM COMPARISON: None available. CLINICAL HISTORY: fall FINDINGS: BONES AND JOINTS: Comminuted intertrochanteric fracture of right hip with varus angulation. SOFT TISSUES: The soft tissues are unremarkable. VASCULATURE: Vascular stent in right common iliac artery region. Vascular calcifications. IMPRESSION: 1. Comminuted intertrochanteric fracture of the right hip with varus angulation. Electronically signed by: Greig Pique MD 09/24/2024  12:28 AM EST RP Workstation: [id number]   DG Chest Port 1 View Result Date: 09/24/2024 EXAM: 1 VIEW(S) XRAY OF THE CHEST 09/24/2024 12:08:00 AM COMPARISON: 11/29/2023 CLINICAL HISTORY: pain FINDINGS: LUNGS AND PLEURA: No focal pulmonary opacity. No pleural effusion. No pneumothorax. HEART AND MEDIASTINUM: Cardiomegaly, unchanged. Atherosclerotic calcifications. BONES AND SOFT TISSUES: Chronic left clavicular fracture. No acute osseous abnormality. IMPRESSION: 1. No acute findings. 2. Cardiomegaly, unchanged. 3. Atherosclerotic calcifications. Electronically signed by: Greig Pique MD 09/24/2024 12:28 AM EST RP Workstation: [id number]    EKG: I independently viewed the EKG done and my findings are as followed: Sinus rhythm rate of 95.  QTc 488.  Assessment/Plan Present on Admission:  Intertrochanteric fracture of right hip (HCC)  Principal Problem:   Intertrochanteric fracture of right hip (HCC)  Acute comminuted intertrochanteric fracture right hip, seen on right hip x-ray, POA Continue pain control as needed. Plan for surgical repair by orthopedic surgery  Coronary artery disease status post PCI with stenting Hold off home Plavix  Resume home Crestor. No reported anginal symptoms. Monitor on telemetry.  Paroxysmal A-fib previously on Eliquis  Post Watchman procedure Currently rate controlled Monitor on telemetry.  Hypertension BP is not at goal,  elevated Resume home losartan  and p.o. hydralazine  Closely monitor vital signs  Hyperlipidemia Resume home Zetia  and Crestor.  GERD Resume home PPI twice daily  Type 2 diabetes with hyperglycemia Last hemoglobin A1c 6.6 on 08/20/2023. Presented with serum glucose of 182 Subcu insulin  every 4 hours while NPO  CKD 3B Appears to be at his baseline creatinine 1.5 GFR 44 Avoid nephrotoxic agents, dehydration, and hypotension Gentle IV fluid hydration LR at 75 cc/h x 1 day Monitor urine output  Parkinsonism Resume home Sinemet     Time: 75 minutes.    DVT prophylaxis: SCDs.  Code Status: Full code.  Family Communication: None at bedside.  Disposition Plan: Admitted to telemetry unit.  Consults called: Orthopedic surgery, Dr. Georgina, consulted by EDP.  Admission status: Inpatient status.   Status is: Inpatient The patient requires at least 2 midnights for further evaluation and treatment of present condition.   Terry LOISE Hurst MD [facility] Pager [phone]  If 7PM-7AM, please contact night-coverage [email or web address] Password [email or web address]  09/24/2024, 1:48 AM      [1] No Known Allergies  "

## 2024-09-24 NOTE — Progress Notes (Addendum)
 I was called by the ER at [facility] about this patient who is up at [facility].  Dr. Margrette was initially called but felt the patient needed to be transferred. The patient has a peritrochanteric hip fracture which could be handled by a general orthopedic surgeon.  I called Dr. Margrette asking for clarification of why this patient would warrant transfer since this is normally a fracture that can be managed by a general orthopedist which is who would be managing it at [facility].  He explained that the patient has a significant cardiac history which he felt warranted the transfer.  I discussed that a lot of these patients do have significant cardiac history as hip fractures tend to happen to elderly patients with medical comorbidities. Given the patient population that tends to get hip fractures, there is an increased mortality with these surgeries.  Literature quotes rates such as 7% for inpatient mortality and 30% at one year. It looks like he has been following up with cardiology with his most recent follow up on 08/24/2024. They noted improved EF and no plans for intervention. He also saw EP cardiology on 08/11/2024 with mention of routine CT scan but no intervention planned. In the setting of hip fractures, surgery is still recommended unless there is a modifiable cardiac risk in which case cardiac risk should be modified and then proceed with surgery afterwards. His note mentions limited staff at [facility] which is the same issue at [facility] on a holiday weekend. He stated that they are doing a hemiarthroplasty case up there today (1/01) which would imply that there is staff up there to get cases done. Regardless, it seems like Dr. Areatha bigger concern is that the medicine service cannot manage his cardiac history at [facility] so he needs transfer to [facility]. It does not sound like there are any modifiable cardiac risk factors based on the most recent note from cariology. I do not see any newer medicine or  cardiologist notes to suggest a modifiable risk factor, but I cannot say there is not. Will have him admitted to the hospitalist service to determine if there is any modifiable cardiac risk factor and to optimize him for surgery. Mortality rates have been shown to increase with delayed fixation of hip fractures, so I will plan to proceed with fixation unless medicine determines that there is a modifiable risk factor.    Plan: Admit to hospitalist service, transfer to [facility] Will plan for operative management on 1/01 unless there is a modifiable cardiac risk factor NPO except for sips with meds NWB RLE Full consultation note to follow in AM   Ozell DELENA Ada, MD Orthopedic Surgeon

## 2024-09-24 NOTE — Anesthesia Preprocedure Evaluation (Signed)
"                                    Anesthesia Evaluation  Patient identified by MRN, date of birth, ID band Patient awake    Reviewed: Allergy & Precautions, H&P , NPO status , Patient's Chart, lab work & pertinent test results  Airway Mallampati: II   Neck ROM: full    Dental   Pulmonary former smoker   breath sounds clear to auscultation       Cardiovascular hypertension, + CAD, + Past MI, + Cardiac Stents, + Peripheral Vascular Disease and +CHF  + dysrhythmias Atrial Fibrillation  Rhythm:irregular Rate:Normal  S/p watchman placement 07/23/24.   Neuro/Psych  Neuromuscular disease CVA    GI/Hepatic   Endo/Other  diabetes, Type 2    Renal/GU Renal InsufficiencyRenal disease     Musculoskeletal  (+) Arthritis ,    Abdominal   Peds  Hematology   Anesthesia Other Findings   Reproductive/Obstetrics                              Anesthesia Physical Anesthesia Plan  ASA: 3  Anesthesia Plan: General   Post-op Pain Management:    Induction: Intravenous  PONV Risk Score and Plan: 2 and Ondansetron , Dexamethasone  and Treatment may vary due to age or medical condition  Airway Management Planned: Oral ETT  Additional Equipment:   Intra-op Plan:   Post-operative Plan: Extubation in OR  Informed Consent: I have reviewed the patients History and Physical, chart, labs and discussed the procedure including the risks, benefits and alternatives for the proposed anesthesia with the patient or authorized representative who has indicated his/her understanding and acceptance.     Dental advisory given  Plan Discussed with: CRNA, Anesthesiologist and Surgeon  Anesthesia Plan Comments:         Anesthesia Quick Evaluation  "

## 2024-09-24 NOTE — Op Note (Addendum)
 Orthopedic Surgery Operative Report   Procedure: Right peritrochanteric femur fracture open reduction internal fixation with cephalomedullary rod   Modifier: none   Date of procedure: 09/24/2024   Patient name: Connor Bryant   MRN: [id number]  DOB: 01/17/1946   Surgeon: Ozell Ada, MD Assistant: none Pre-operative diagnosis: right peritrochanteric femur fracture Post-operative diagnosis: same as above Findings: right peritrochanteric femur fracture   Specimens: none Anesthesia: general EBL: 200cc Complications: none Pre-incision antibiotic: ancef  TXA was given prior to incision as well    Implants:  Implant Name Type Inv. Item Serial No. Manufacturer Lot No. LRB No. Used Action  NAIL RIGHT [id number] - [id number] Nail NAIL RIGHT [id number]  [employer] [id number] Right 1 Implanted  SCREW LAG COMPR KIT 100/95 - [id number] Screw SCREW LAG COMPR KIT 100/95  [employer] [id number] Right 1 Implanted  SCREW TRIGEN LOW PROF 5.0X32.5 - [id number] Screw SCREW TRIGEN LOW PROF 5.0X32.5  [employer] [id number] Right 2 Implanted       Indication for procedure: Patient is a 79 y.o. male who presented to the ER after a ground level fall. The patient had right hip pain and x-rays revealed an peritrochanteric femur fracture. The patient was admitted to a medicine service with orthopedics consulted. I met the patient and discussed the fracture. I recommended operative management in the form of intramedullary rodding to stabilize the fracture and allow for mobilization. Explained the risks of this procedure included, but were not limited to: nonunion, malunion, fixation failure, infection, bleeding, stiffness, need for additional procedures, neurovascular injury, leg length discrepancy, screw cut out, deep vein thrombosis, pulmonary embolism, MI, and death. The alternatives of this surgery would be to treat the fracture with immobilization in traction or to  perform no intervention. After our discussion, patient elected to proceed with surgery.    Procedure Description: The patient was met in the pre-operative holding area. The patient's identity and consent were verified. The operative site was marked by myself. The patient's remaining questions about the surgery were answered. The patient was brought back to the operating room. General anesthesia was induced and an endotracheal tube was placed by the anesthesia staff. The patient was transferred to the [facility] table. All bony prominences were well padded. Traction was applied and an attempt at closed reduction with the Hana table was made. Fluoroscopy confirmed a satisfactory reduction with slight valgus alignment. The surgical area was cleansed with alcohol. Ancef  and TXA were administered by anesthesia. The patient's skin was then prepped and draped in a standard, sterile fashion. A time out was performed that identified the patient, the procedure, and the operative site. All team members agreed with what was stated in the time out.    An incision was made just proximal and inferior to the greater trochanter. The incision was taken sharply down through the fascia. A guide pin was inserted into the wound onto the top of the greater trochanter. Fluoroscopy was used to place the guide pin at the starting point at the tip of the greater trochanter and in line with the middle of the femoral neck. The wire was then advanced to a point just past the lesser trochanter. A soft tissue sleeve was advanced over the wire onto the greater trochanter. An entry reamer was used to open the proximal femoral canal under fluoroscopic guidance. The pin and reamer were removed. A long guide wire was placed down the femoral canal. It was advanced to the superior aspect of the  patella under fluoroscopy. The length of the nail was estimated off of the guide wire. The nail measure about 41 so a 38 was selected. A 9mm reamer was inserted  over the guidewire and used to ream the femoral canal. It was advanced down past the isthmus under fluoroscopic guidance. The canal was serially reamed with increasing sized reamers until a 11.64mm reamer at which point there was chatter. A 10 nail was selected. The nail was advanced over the guidewire. It was advanced until the lag screws were estimated to end in the center of the femoral head. The guide wire was removed.    An incision was made sharply through the skin, dermis, and fascia over the lateral thigh in the area where the lag screws would be inserted. The lag screw inserter was placed through the jig onto the lateral femoral cortex. A guide wire was advanced through the lag screw inserted into the femoral head under fluoroscopic guidance. It was found to be in acceptable position on the AP and lateral views. The length of the lag screw was estimated off of the guide wire. A screw was selected. The inferior lag screw was drilled through the guide. The derotation device was placed through the inserter. The proximal lag screw hole was then drilled over the guide wire. The screw was inserted over the wire under fluoroscopic guidance. The derotation bar was removed and the inferior lag screw was inserted.    The C arm was then brought to the knee in a lateral position to obtain perfect circles at the distal interlocking holes. An incision was made over the distal interlocking holes on the lateral aspect of the femur. This incision was taken down through fascia. A drill was inserted into the wound and placed over the interlocking hole using perfect circle technique. The hole was then drilled bicortically. A depth gauge was used to estimate the screw length. A 32.84mm screw was selected for the more proximal hole. This was inserted and there was good purchase. The same process was then repeated to insert a 32.9mm screw in the more distal hole.    Final AP and lateral fluoroscopic images were then  taken of the hip, femur, and knee showing satisfactory reduction and placement of the cephalomedullary rod. The wounds were copiously irrigated with sterile saline. Vancomycin  powder was placed into the wounds. The fascia was closed with 0 vicryl. The deep dermal layer was closed with 2-0 vicryl. The skin was closed with staples. Dressings were applied. All counts were correct at the end of the case. Patient was transferred back to a hospital bed. The patient was awakened from anesthesia and brought back to the post-anesthesia care unit in stable condition.     Post-operative plan: The patient will recover in the post-anesthesia care unit and then go to the floor on the medicine service. The patient will receive two post-operative doses of ancef . The patient will get another dose of TXA. The patient will be weight bearing as tolerated. The patient will work with physical therapy. The patient's disposition will be determined by the medicine service.        Ozell Ada, MD Orthopedic Surgeon

## 2024-09-24 NOTE — Progress Notes (Signed)
 Pt to room 9 at this time from the PACU. Pt is A/Ox4 and on 2LNC. Vital taken. Pt has LR @75ml /hr infusing in the RFA. Incision site on R leg assessed with PACU nurse.  Dressings are clean, dry and intact. Pruewick in place. Tele applied and verified with crystal, RN.

## 2024-09-24 NOTE — Progress Notes (Signed)
 Patient ID: Connor Bryant, male   DOB: 06-02-1946, 79 y.o.   MRN: [id number]  Right hip fracture Intertrochanteric   History noted below:  It is my medical opinion that Connor Bryant has far too significant CAD to have hip fracture repair here especially in the setting of New Years Day and limited staffing and lack of in house Cardiology coverage Thurs and after 5 on FRI and thru out the weekend. He is at highest risk for post op complications   Medical History:  History of Present Illness He has a history of atrial fibrillation and heart failure, with fluctuating heart pumping function, particularly during episodes of atrial fibrillation. In November, atrial fibrillation was detected on monitors without symptoms, although he previously experienced palpitations, especially at night. No increased fatigue or dyspnea during these episodes. He is currently on amiodarone  to maintain normal rhythm. He has experienced bleeding issues with anticoagulation therapy when on both Eliquis  and Plavix , including gingival bleeding, epistaxis and GI bleeding. Currently, he is on Plavix  for his heart stents. He reports issues with blood pressure management. Recently prescribed an increased dose of Isosorbide  (a tablet and a half three times a day), he experienced dizziness and a blood pressure reading of 112/60 mmHg, which he felt was too low. Prefers his blood pressure to be around 120-130 mmHg; he states that he developed symptoms of lower than this.. After reducing the dose to one tablet, his blood pressure was 152 mmHg before taking the medication and 199 mmHg after. He acknowledges having 'white coat syndrome' and notes that his blood pressure readings at home are usually within normal range.   Primary Discharge Diagnosis:  Persistent Atrial Fibrillation Poor candidacy for long term anticoagulation due to frequent nosebleeds, gum bleeding and GI bleeding.   Secondary Discharge Diagnosis:  Multivessel CAD s/p proximal  LAD and RCA stenting  CVA GIB  Epistaxis  HFrecEF   Procedures This Admission:  Transeptal Puncture Intra-procedural TEE which showed no LAA thrombus Left atrial appendage occlusive device placement on 07/23/24 by Dr. Kennyth.      This study demonstrated: Successful implantation of a 27mm WATCHMAN Flx Pro left atrial appendage occlusive device.    TEE demonstrating no LAA thrombus. No early apparent complications. The patient has been scheduled for post procedure follow up with EP APP in approximately 6 weeks. They will restart Eliquis  this evening and continue for 45 days then stop. At that time he will transition to Plavix  75mg  daily + ASA 81 mg daily to complete 6 months of therapy. At that time he will likely be on Plavix  long term due to extensive CAD.  They will require dental SBE for 6 month post op and should refrain from dental work or cleanings for the first 45 days post implant. SBE to be RXd at follow up.

## 2024-09-24 NOTE — Transfer of Care (Signed)
 Immediate Anesthesia Transfer of Care Note  Patient: Connor Bryant  Procedure(s) Performed: FIXATION, FRACTURE, INTERTROCHANTERIC, WITH INTRAMEDULLARY ROD (Right: Leg Upper)  Patient Location: PACU  Anesthesia Type:General  Level of Consciousness: awake and alert   Airway & Oxygen Therapy: Patient Spontanous Breathing and Patient connected to nasal cannula oxygen  Post-op Assessment: Report given to RN and Post -op Vital signs reviewed and stable  Post vital signs: Reviewed and stable  Last Vitals:  Vitals Value Taken Time  BP 133/96 09/24/24 17:26  Temp    Pulse 97 09/24/24 17:28  Resp 15 09/24/24 17:28  SpO2 98 % 09/24/24 17:28  Vitals shown include unfiled device data.  Last Pain:  Vitals:   09/24/24 1341  TempSrc:   PainSc: 10-Worst pain ever      Patients Stated Pain Goal: 3 (09/24/24 1033)  Complications: No notable events documented.

## 2024-09-24 NOTE — ED Provider Notes (Signed)
 " [place] EMERGENCY DEPARTMENT AT [facility] Provider Note   CSN: [id number] Arrival date & time: 09/23/24  2319     Patient presents with: Connor Bryant is a 79 y.o. male.   The history is provided by the patient.  Patient history of CVA, CAD, diabetes, atrial fibrillation, currently on Plavix , currently with Watchman device in place presents with right hip pain Patient reports he was working on his Christmas tree when he stepped backwards and fell landing on his right hip.  He denies any head injury or LOC.  No headache or neck pain.  No chest or back pain.  He reports all of the pain is in his right hip Last dose of Plavix  was the morning of December 31   Past Medical History:  Diagnosis Date   Acute CVA (cerebrovascular accident) (HCC) 05/01/2020   Acute respiratory failure with hypoxia (HCC) 05/04/2020   Arthritis    Atrial fibrillation (HCC)    DM type 2 (diabetes mellitus, type 2) (HCC)    no meds   Endotracheally intubated    Gallstone    Heart murmur    was told one time he had a murmur, not since 30 years ago   HTN (hypertension)    Hyperlipidemia    Myocardial infarct, old    2021   Personal history of colonic polyp-adenoma 11/24/2013   Presence of Watchman left atrial appendage closure device 07/23/2024   27 mm Device, JP   Renal insufficiency    mild   Thoracic spine fracture (HCC)     Prior to Admission medications  Medication Sig Start Date End Date Taking? Authorizing Provider  Accu-Chek Softclix Lancets lancets SMARTSIG:Topical 1-3 Times Daily 06/25/23   [provider]  acetaminophen  (TYLENOL ) 325 MG tablet Take 2 tablets (650 mg total) by mouth every 4 (four) hours as needed for headache or mild pain (pain score 1-3). 07/24/24   Lesia Ozell Barter, PA-C  allopurinol  (ZYLOPRIM ) 100 MG tablet Take 100 mg by mouth 2 (two) times daily.    [provider]  atorvastatin  (LIPITOR ) 80 MG tablet Take 1 tablet (80 mg  total) by mouth daily. 10/15/23   Zollie Lowers, MD  Blood Glucose Monitoring Suppl (ACCU-CHEK GUIDE) w/Device KIT CHECK BLOOD SUGAR UP TO THREE TIMES DAILY AS DIRECTED 06/25/23   [provider]  Blood Glucose Monitoring Suppl DEVI 1 each by Does not apply route in the morning, at noon, and at bedtime. May substitute to any manufacturer covered by patient's insurance. 06/25/23   Zollie Lowers, MD  Calcium  Carb-Cholecalciferol (CALCIUM  500 + D PO) Take 1 tablet by mouth daily.    [provider]  carbidopa -levodopa  (SINEMET ) 10-100 MG tablet Take 1 tablet by mouth 3 (three) times daily. For parkinsonism 01/06/24   Zollie Lowers, MD  clopidogrel  (PLAVIX ) 75 MG tablet Take 1 tablet (75 mg total) by mouth daily. 09/03/24   Lesia Ozell Barter, PA-C  dapagliflozin  propanediol (FARXIGA ) 10 MG TABS tablet Take 1 tablet (10 mg total) by mouth daily. 02/03/24   Milford, Harlene [name], FNP  ezetimibe  (ZETIA ) 10 MG tablet Take 1 tablet (10 mg total) by mouth daily. 08/25/24 11/23/24  Rolan Ezra RAMAN, MD  ferrous sulfate  325 (65 FE) MG tablet Take 1 tablet (325 mg total) by mouth daily. 09/28/23   Lee, Jordan, NP  hydrALAZINE  (APRESOLINE ) 100 MG tablet Take 1 tablet (100 mg total) by mouth 3 (three) times daily. 05/26/24   Glena Harlene [name],  FNP  isosorbide  mononitrate (IMDUR ) 30 MG 24 hr tablet Take 1 tablet (30 mg total) by mouth daily. 05/26/24 08/24/24  Glena Harlene [name], FNP  losartan  (COZAAR ) 50 MG tablet Take 50 mg by mouth daily. 06/03/24   [provider]  pantoprazole  (PROTONIX ) 40 MG tablet TAKE ONE TABLET TWICE DAILY 03/30/24   Zollie Lowers, MD    Allergies: Patient has no known allergies.    Review of Systems  Constitutional:  Negative for fever.  Cardiovascular:  Negative for chest pain.  Gastrointestinal:  Negative for abdominal pain.  Musculoskeletal:  Positive for arthralgias. Negative for neck pain.  Neurological:  Negative for headaches.    Updated Vital Signs BP  (!) 174/68 (BP Location: Left Arm)   Pulse 98   Temp 98.1 F (36.7 C) (Oral)   Resp (!) 25   Ht 1.778 m (5' 10)   Wt 83.9 kg   SpO2 97%   BMI 26.54 kg/m   Physical Exam CONSTITUTIONAL: Well developed/well nourished HEAD: Normocephalic/atraumatic, no evidence of head trauma EYES: EOMI/PERRL ENMT: Mucous membranes moist NECK: supple no meningeal signs SPINE/BACK: No cervical spine tenderness CV: S1/S2 noted, no murmurs/rubs/gallops noted LUNGS: Lungs are clear to auscultation bilaterally, no apparent distress Chest-no bruising or crepitance or significant tenderness ABDOMEN: soft, nontender NEURO: Pt is awake/alert/appropriate. He is able to wiggle the toes on his right foot EXTREMITIES: Distal pulses intact in both feet Tenderness to palpation and ROM of right hip with deformity No right knee tenderness.  No tenderness noted to the right tibial surface or the right foot or ankle All other extremities/joints palpated/ranged and nontender SKIN: warm, color normal  (all labs ordered are listed, but only abnormal results are displayed) Labs Reviewed  BASIC METABOLIC PANEL WITH GFR - Abnormal; Notable for the following components:      Result Value   CO2 21 (*)    Glucose, Bld 182 (*)    BUN 28 (*)    Creatinine, Ser 1.59 (*)    GFR, Estimated 44 (*)    Anion gap 16 (*)    All other components within normal limits  CBC WITH DIFFERENTIAL/PLATELET - Abnormal; Notable for the following components:   WBC 11.4 (*)    Hemoglobin 12.4 (*)    HCT 37.9 (*)    Neutro Abs 9.9 (*)    Abs Immature Granulocytes 0.08 (*)    All other components within normal limits  PROTIME-INR  CBC WITH DIFFERENTIAL/PLATELET  BASIC METABOLIC PANEL WITH GFR  MAGNESIUM   PHOSPHORUS  TYPE AND SCREEN    EKG: EKG Interpretation Date/Time:  Thursday September 24 2024 00:39:44 EST Ventricular Rate:  95 PR Interval:  181 QRS Duration:  104 QT Interval:  388 QTC Calculation: 488 R Axis:   32  Text  Interpretation: Sinus rhythm Atrial premature complex Anterior infarct, old No significant change since last tracing Confirmed by Midge Golas ([id number]) on 09/24/2024 1:53:16 AM  Radiology: ARCOLA Hip Unilat  With Pelvis 2-3 Views Right Result Date: 09/24/2024 EXAM: 2 or more VIEW(S) XRAY OF THE RIGHT HIP 09/24/2024 12:09:00 AM COMPARISON: None available. CLINICAL HISTORY: fall FINDINGS: BONES AND JOINTS: Comminuted intertrochanteric fracture of right hip with varus angulation. SOFT TISSUES: The soft tissues are unremarkable. VASCULATURE: Vascular stent in right common iliac artery region. Vascular calcifications. IMPRESSION: 1. Comminuted intertrochanteric fracture of the right hip with varus angulation. Electronically signed by: Greig Pique MD 09/24/2024 12:28 AM EST RP Workstation: [id number]   DG Chest Port 1 View Result Date: 09/24/2024  EXAM: 1 VIEW(S) XRAY OF THE CHEST 09/24/2024 12:08:00 AM COMPARISON: 11/29/2023 CLINICAL HISTORY: pain FINDINGS: LUNGS AND PLEURA: No focal pulmonary opacity. No pleural effusion. No pneumothorax. HEART AND MEDIASTINUM: Cardiomegaly, unchanged. Atherosclerotic calcifications. BONES AND SOFT TISSUES: Chronic left clavicular fracture. No acute osseous abnormality. IMPRESSION: 1. No acute findings. 2. Cardiomegaly, unchanged. 3. Atherosclerotic calcifications. Electronically signed by: Greig Pique MD 09/24/2024 12:28 AM EST RP Workstation: [id number]     Procedures   Medications Ordered in the ED  HYDROmorphone  (DILAUDID ) injection 0.5 mg (0.5 mg Intravenous Given 09/24/24 0054)  acetaminophen  (TYLENOL ) tablet 500 mg (has no administration in time range)  prochlorperazine (COMPAZINE) injection 5 mg (has no administration in time range)  melatonin tablet 5 mg (has no administration in time range)  polyethylene glycol (MIRALAX  / GLYCOLAX ) packet 17 g (has no administration in time range)  oxyCODONE  (Oxy IR/ROXICODONE ) immediate release tablet 5-10 mg (has no  administration in time range)  ondansetron  (ZOFRAN ) injection 4 mg (4 mg Intravenous Given 09/24/24 0020)    Clinical Course as of 09/24/24 0154  Thu Sep 24, 2024  0058 Discussed the case with Dr. Margrette with orthopedic surgery Due to patient's multiple cardiac comorbidities, patient will need to be transferred to [facility] for management of his hip fracture [DW]  0116 Glucose(!): 182 Hyperglycemia [DW]  0116 Creatinine(!): 1.59 Renal insufficiency [DW]  0153 Discussed the case with Dr. Georgina with orthopedic surgery He recommends patient admitted to [facility] and transferred to [facility] [DW]    Clinical Course User Index [DW] Midge Golas, MD                                 Medical Decision Making Amount and/or Complexity of Data Reviewed Labs: ordered. Decision-making details documented in ED Course. Radiology: ordered.  Risk Prescription drug management. Decision regarding hospitalization.   This patient presents to the ED for concern of right hip pain, this involves an extensive number of treatment options, and is a complaint that carries with it a high risk of complications and morbidity.  The differential diagnosis includes but is not limited to muscle strain, septic arthritis, fracture, dislocation, pelvic fracture  Comorbidities that complicate the patient evaluation: Patients presentation is complicated by their history of atrial fibrillation, CVA  Social Determinants of Health: Patients tobacco use  increases the complexity of managing their presentation  Additional history obtained: Records reviewed cardiology notes reviewed  Lab Tests: I Ordered, and personally interpreted labs.  The pertinent results include: Renal insufficiency  Imaging Studies ordered: I ordered imaging studies including X-ray right hip  I independently visualized and interpreted imaging which showed right intertrochanteric fracture I agree with the radiologist  interpretation  Cardiac Monitoring: The patient was maintained on a cardiac monitor.  I personally viewed and interpreted the cardiac monitor which showed an underlying rhythm of:  sinus rhythm  Medicines ordered and prescription drug management: I ordered medication including Dilaudid  for pain Reevaluation of the patient after these medicines showed that the patient    improved  Critical Interventions:   patient for management of right hip fracture  Consultations Obtained: I requested consultation with the admitting physician [facility] Dr. Shona and consultant Dr. Georgina with Ortho, and discussed  findings as well as pertinent plan - they recommend: Admit to [facility]  Reevaluation: After the interventions noted above, I reevaluated the patient and found that they have :improved  Complexity of problems addressed: Patients  presentation is most consistent with  acute presentation with potential threat to life or bodily function  Disposition: After consideration of the diagnostic results and the patients response to treatment,  I feel that the patent would benefit from admission  .   Patient with isolated right hip fracture.  There is no signs of any head or neck trauma.  No signs of any chest or abdominal trauma Distal pulses intact, no signs of any neurovascular compromise Patient is stable in the ER for admission     Final diagnoses:  Closed displaced intertrochanteric fracture of right femur, initial encounter [facility])    ED Discharge Orders     None          Midge Golas, MD 09/24/24 0155  "

## 2024-09-24 NOTE — Anesthesia Procedure Notes (Signed)
 Procedure Name: Intubation Date/Time: 09/24/2024 3:16 PM  Performed by: Jama Powell NOVAK, CRNAPre-anesthesia Checklist: Patient identified, Timeout performed, Emergency Drugs available, Suction available and Patient being monitored Patient Re-evaluated:Patient Re-evaluated prior to induction Oxygen Delivery Method: Circle system utilized Preoxygenation: Pre-oxygenation with 100% oxygen Induction Type: IV induction Ventilation: Mask ventilation without difficulty Laryngoscope Size: Mac and 4 Grade View: Grade II Tube size: 7.5 mm Number of attempts: 1 Airway Equipment and Method: Stylet Placement Confirmation: breath sounds checked- equal and bilateral, CO2 detector, positive ETCO2 and ETT inserted through vocal cords under direct vision Secured at: 23 cm Tube secured with: Tape Dental Injury: Teeth and Oropharynx as per pre-operative assessment

## 2024-09-24 NOTE — ED Notes (Signed)
 Pt leaving with [facility] via stretcher. Stable.

## 2024-09-24 NOTE — Progress Notes (Signed)
 [facility] PROGRESS NOTE  Connor Bryant (DOB: 20-Nov-1945) FMW:[id number] PCP: Renato Dorothey [name], NP  Brief Narrative: Connor Bryant is a 79 y.o. male with a history of CAD s/p PCI on Plavix , persistent AFib s/p Watchman (off anticoagulation), HFpEF, type 2 DM, CKD 3B admitted overnight after mechanical fall with comminuted intertrochanteric fracture of right hip. Awaiting transfer to [facility] for orthopedic surgical repair per Dr. Georgina.  Subjective: Pain is 5/10 in right hip, nowhere else at this time. Pain medications work fine, no complaints. He is laying completely supine and has no dyspnea, does not generally have dyspnea and has no current or recent chest pain.   Objective: BP (!) 175/71   Pulse 92   Temp 98.5 F (36.9 C) (Oral)   Resp 13   Ht 5' 10 (1.778 m)   Wt 83.9 kg   SpO2 97%   BMI 26.54 kg/m   Gen: Elderly male in no distress Pulm: Clear, nonlabored  CV: Early systolic murmur, II/VI at the RUSB, no rub or gallop, no JVD, no LE edema GI: Soft, NT, ND, +BS Neuro: Alert and oriented. No new focal deficits. Ext: RLE externally rotated and shortened. Soft thigh compartment. DP pulses palpable, SILT.   Assessment & Plan: Comminuted intertrochanteric right hip fracture  - Awaiting transfer to [facility] for ORIF by orthopedics (Dr. Georgina). Pain controlled, continue current regimen.  - Plavix  held perioperatively. Defer restarting this, VTE ppx, weight bearing status to orthopedics.  - Postoperative PT/OT  CAD s/p PCI: No anginal symptoms - Plavix  held for surgery.  - Continue atorvastatin , zetia .    Persistent AFib s/p Watchman: Rate is controlled.  - Monitor telemetry perioperatively.  HTN: BP elevated on admission (164/89).  - Continue losartan , hydralazine .  T2DM: HbA1c 6.8%.  - SSI q4h while NPO  CKD 3B - Cr 1.59 at baseline.  - LR 75 mL/hr - Avoid nephrotoxins.  Parkinsonism: Quiescent.  - Continue carbidopa -levodopa .  Bernardino KATHEE Come,  MD [facility] [email or web address] 09/24/2024, 11:54 AM

## 2024-09-25 ENCOUNTER — Ambulatory Visit (HOSPITAL_COMMUNITY)

## 2024-09-25 DIAGNOSIS — S72144A Nondisplaced intertrochanteric fracture of right femur, initial encounter for closed fracture: Secondary | ICD-10-CM

## 2024-09-25 DIAGNOSIS — I4891 Unspecified atrial fibrillation: Secondary | ICD-10-CM

## 2024-09-25 DIAGNOSIS — D62 Acute posthemorrhagic anemia: Secondary | ICD-10-CM | POA: Diagnosis not present

## 2024-09-25 DIAGNOSIS — G20C Parkinsonism, unspecified: Secondary | ICD-10-CM | POA: Insufficient documentation

## 2024-09-25 DIAGNOSIS — N1832 Chronic kidney disease, stage 3b: Secondary | ICD-10-CM | POA: Insufficient documentation

## 2024-09-25 DIAGNOSIS — E785 Hyperlipidemia, unspecified: Secondary | ICD-10-CM | POA: Insufficient documentation

## 2024-09-25 DIAGNOSIS — K219 Gastro-esophageal reflux disease without esophagitis: Secondary | ICD-10-CM | POA: Insufficient documentation

## 2024-09-25 LAB — BASIC METABOLIC PANEL WITH GFR
Anion gap: 10 (ref 5–15)
BUN: 25 mg/dL — ABNORMAL HIGH (ref 8–23)
CO2: 24 mmol/L (ref 22–32)
Calcium: 8.8 mg/dL — ABNORMAL LOW (ref 8.9–10.3)
Chloride: 102 mmol/L (ref 98–111)
Creatinine, Ser: 1.36 mg/dL — ABNORMAL HIGH (ref 0.61–1.24)
GFR, Estimated: 53 mL/min — ABNORMAL LOW
Glucose, Bld: 181 mg/dL — ABNORMAL HIGH (ref 70–99)
Potassium: 4.5 mmol/L (ref 3.5–5.1)
Sodium: 136 mmol/L (ref 135–145)

## 2024-09-25 LAB — CBC
HCT: 28.7 % — ABNORMAL LOW (ref 39.0–52.0)
Hemoglobin: 9.4 g/dL — ABNORMAL LOW (ref 13.0–17.0)
MCH: 29.9 pg (ref 26.0–34.0)
MCHC: 32.8 g/dL (ref 30.0–36.0)
MCV: 91.4 fL (ref 80.0–100.0)
Platelets: 196 K/uL (ref 150–400)
RBC: 3.14 MIL/uL — ABNORMAL LOW (ref 4.22–5.81)
RDW: 14.6 % (ref 11.5–15.5)
WBC: 9.6 K/uL (ref 4.0–10.5)
nRBC: 0 % (ref 0.0–0.2)

## 2024-09-25 LAB — PHOSPHORUS: Phosphorus: 3.7 mg/dL (ref 2.5–4.6)

## 2024-09-25 LAB — GLUCOSE, CAPILLARY
Glucose-Capillary: 123 mg/dL — ABNORMAL HIGH (ref 70–99)
Glucose-Capillary: 153 mg/dL — ABNORMAL HIGH (ref 70–99)
Glucose-Capillary: 156 mg/dL — ABNORMAL HIGH (ref 70–99)
Glucose-Capillary: 166 mg/dL — ABNORMAL HIGH (ref 70–99)
Glucose-Capillary: 170 mg/dL — ABNORMAL HIGH (ref 70–99)
Glucose-Capillary: 184 mg/dL — ABNORMAL HIGH (ref 70–99)

## 2024-09-25 LAB — MAGNESIUM: Magnesium: 1.9 mg/dL (ref 1.7–2.4)

## 2024-09-25 MED ORDER — INSULIN ASPART 100 UNIT/ML IJ SOLN
0.0000 [IU] | Freq: Three times a day (TID) | INTRAMUSCULAR | Status: DC
  Administered 2024-09-25 – 2024-09-26 (×3): 2 [IU] via SUBCUTANEOUS
  Administered 2024-09-26: 1 [IU] via SUBCUTANEOUS
  Administered 2024-09-26 (×2): 2 [IU] via SUBCUTANEOUS
  Administered 2024-09-27: 1 [IU] via SUBCUTANEOUS
  Administered 2024-09-27 (×2): 2 [IU] via SUBCUTANEOUS
  Administered 2024-09-27 – 2024-09-28 (×2): 1 [IU] via SUBCUTANEOUS
  Administered 2024-09-28: 3 [IU] via SUBCUTANEOUS
  Administered 2024-09-28 – 2024-09-29 (×4): 2 [IU] via SUBCUTANEOUS
  Administered 2024-09-29 – 2024-09-30 (×3): 1 [IU] via SUBCUTANEOUS
  Administered 2024-09-30 (×3): 2 [IU] via SUBCUTANEOUS
  Administered 2024-10-01 (×3): 1 [IU] via SUBCUTANEOUS
  Administered 2024-10-01: 2 [IU] via SUBCUTANEOUS
  Administered 2024-10-02: 9 [IU] via SUBCUTANEOUS
  Administered 2024-10-02: 1 [IU] via SUBCUTANEOUS
  Filled 2024-09-25 (×4): qty 2
  Filled 2024-09-25: qty 1
  Filled 2024-09-25: qty 2
  Filled 2024-09-25: qty 1
  Filled 2024-09-25 (×4): qty 2
  Filled 2024-09-25 (×2): qty 1
  Filled 2024-09-25: qty 2
  Filled 2024-09-25 (×2): qty 1
  Filled 2024-09-25: qty 2
  Filled 2024-09-25: qty 1
  Filled 2024-09-25: qty 2
  Filled 2024-09-25: qty 3
  Filled 2024-09-25: qty 1
  Filled 2024-09-25: qty 2
  Filled 2024-09-25: qty 1
  Filled 2024-09-25: qty 2
  Filled 2024-09-25: qty 1
  Filled 2024-09-25 (×3): qty 2

## 2024-09-25 NOTE — Evaluation (Signed)
 Physical Therapy Evaluation Patient Details Name: Connor Bryant MRN: [id number] DOB: 02-09-1946 Today's Date: 09/25/2024  History of Present Illness  Pt is 79 yo presenting to [facility] on 1/1 due to fall. Currently pt is s/p ORIF with cephalomedullary rod on 09/24/2024. PMH: CAD s/p stenting, afib s/p watchman, GIB, HFpEF, DM II.  Clinical Impression  Pt is currently presenting at Mod A for bed mobility, Mod A for sit to stand with RW and Max A for squat pivot transfer. Pt was unable to WB fully through the RLE limited by pain. Heavy multi modal cues for sequencing with squat pivot transfer for safety in order to decrease risk for falls. Pt has family who can assist occasionally but not on a regular basis. Due to pt current functional status, home set up and available assistance at home recommending skilled physical therapy services < 3 hours/day in order to address strength, balance and functional mobility to decrease risk for falls, injury, immobility, skin break down and re-hospitalization.          If plan is discharge home, recommend the following: Assist for transportation;Assistance with cooking/housework   Can travel by private vehicle   No    Equipment Recommendations Wheelchair cushion (measurements PT);Wheelchair (measurements PT);Hospital bed     Functional Status Assessment Patient has had a recent decline in their functional status and demonstrates the ability to make significant improvements in function in a reasonable and predictable amount of time.     Precautions / Restrictions Precautions Precautions: Fall Recall of Precautions/Restrictions: Impaired Restrictions Weight Bearing Restrictions Per Provider Order: Yes RLE Weight Bearing Per Provider Order: Weight bearing as tolerated      Mobility  Bed Mobility Overal bed mobility: Needs Assistance Bed Mobility: Supine to Sit     Supine to sit: Mod assist, HOB elevated, Used rails     General bed mobility comments:  Mod A with RLE and with assist getting trunk to midline. Significant increase in time    Transfers Overall transfer level: Needs assistance Equipment used: Rolling walker (2 wheels), None Transfers: Sit to/from Stand, Bed to chair/wheelchair/BSC Sit to Stand: Mod assist     Squat pivot transfers: Max assist     General transfer comment: Mod A for sit to stand wtih RW with verbal cues for hand placement and sequencing. Pt requires Max A for squat pivot to recliner. pt was unable to clear his R foot or Wgt shift onto the R to attempt a step pivot with RW.    Ambulation/Gait   General Gait Details: unable at this time.     Balance Overall balance assessment: Needs assistance Sitting-balance support: Single extremity supported, Feet supported Sitting balance-Leahy Scale: Fair   Postural control: Posterior lean Standing balance support: Bilateral upper extremity supported, Reliant on assistive device for balance Standing balance-Leahy Scale: Poor Standing balance comment: Mod A for balance.       Pertinent Vitals/Pain Pain Assessment Pain Assessment: 0-10 Pain Score: 6  Pain Descriptors / Indicators: Discomfort, Grimacing, Aching, Numbness Pain Intervention(s): Limited activity within patient's tolerance, Monitored during session, RN gave pain meds during session    Home Living Family/patient expects to be discharged to:: Private residence Living Arrangements: Alone Available Help at Discharge: Family;Friend(s);Available PRN/intermittently Type of Home: House Home Access: Stairs to enter   Entrance Stairs-Number of Steps: 1   Home Layout: One level Home Equipment: Agricultural Consultant (2 wheels);Cane - single point;Cane - quad;BSC/3in1      Prior Function Prior Level of Function :  Independent/Modified Independent             Mobility Comments: Ind ADLs Comments: Ind to Mod I for all ADLs and IADL's.     Extremity/Trunk Assessment   Upper Extremity  Assessment Upper Extremity Assessment: Overall WFL for tasks assessed;Defer to OT evaluation    Lower Extremity Assessment Lower Extremity Assessment: RLE deficits/detail;Generalized weakness RLE Deficits / Details: Recent surgery RLE: Unable to fully assess due to pain    Cervical / Trunk Assessment Cervical / Trunk Assessment: Normal  Communication   Communication Communication: No apparent difficulties    Cognition Arousal: Alert Behavior During Therapy: WFL for tasks assessed/performed   PT - Cognitive impairments: No apparent impairments     Following commands: Intact       Cueing Cueing Techniques: Verbal cues            Assessment/Plan    PT Assessment Patient needs continued PT services  PT Problem List Decreased strength;Pain;Decreased range of motion;Decreased activity tolerance;Decreased balance;Decreased mobility;Impaired sensation       PT Treatment Interventions DME instruction;Therapeutic exercise;Gait training;Balance training;Stair training;Functional mobility training;Therapeutic activities;Patient/family education;Wheelchair mobility training    PT Goals (Current goals can be found in the Care Plan section)  Acute Rehab PT Goals Patient Stated Goal: improve mobility and go home. PT Goal Formulation: With patient Time For Goal Achievement: 10/09/24 Potential to Achieve Goals: Good    Frequency Min 3X/week        AM-PAC PT 6 Clicks Mobility  Outcome Measure Help needed turning from your back to your side while in a flat bed without using bedrails?: A Lot Help needed moving from lying on your back to sitting on the side of a flat bed without using bedrails?: A Lot Help needed moving to and from a bed to a chair (including a wheelchair)?: A Lot Help needed standing up from a chair using your arms (e.g., wheelchair or bedside chair)?: A Lot Help needed to walk in hospital room?: Total Help needed climbing 3-5 steps with a railing? :  Total 6 Click Score: 10    End of Session Equipment Utilized During Treatment: Gait belt Activity Tolerance: Patient limited by pain Patient left: in chair;with call bell/phone within reach;with chair alarm set Nurse Communication: Mobility status;Need for lift equipment PT Visit Diagnosis: Unsteadiness on feet (R26.81);Other abnormalities of gait and mobility (R26.89);Muscle weakness (generalized) (M62.81);Pain Pain - Right/Left: Right Pain - part of body: Hip    Time: 8487-8449 PT Time Calculation (min) (ACUTE ONLY): 38 min   Charges:   PT Evaluation $PT Eval Low Complexity: 1 Low PT Treatments $Therapeutic Activity: 23-37 mins PT General Charges $$ ACUTE PT VISIT: 1 Visit       Dorothyann Maier, DPT, CLT  Acute Rehabilitation Services Office: [phone] (Secure chat preferred)   Dorothyann VEAR Maier 09/25/2024, 4:04 PM

## 2024-09-25 NOTE — Assessment & Plan Note (Signed)
 Continue losartan  and hydralazine 

## 2024-09-25 NOTE — Assessment & Plan Note (Addendum)
-   s/p mechanical fall at home - Sustained right femoral neck fracture after fall - Underwent ORIF with cephalomedullary rod on 09/24/2024 -Still having quite a bit of right hip pain as expected - continue with PT; rec'd for SNF; patient amenable

## 2024-09-25 NOTE — Assessment & Plan Note (Signed)
-   s/p Watchman device - no longer on anticoagulation

## 2024-09-25 NOTE — Progress Notes (Signed)
 PureWick present/in place prior to shift start.  Pt refused PureWick removal.  Pt s/p R Hip Sx; Dressings CDI.  Bil SCD's placed per Order.  Monitoring.

## 2024-09-25 NOTE — Progress Notes (Signed)
 Orthopedic Surgery Progress Note   Assessment: Patient is a 79 y.o. male with right peritrochanteric femur fracture status post CMN   Plan: -Operative plans: complete -Diet: diabetic -DVT ppx: can resume home plavix  -Antibiotics: ancef  x2 post-op doses -Weight bearing status: as tolerated -PT evaluate and treat -Pain control -Dispo: per primary  ___________________________________________________________________________  Subjective: No acute events overnight. Thigh pain has improved since surgery but still having significant groin pain. Has not worked with PT yet.    Physical Exam:  General: no acute distress, appears stated age Neurologic: alert, answering questions appropriately, following commands Respiratory: unlabored breathing on room air, symmetric chest rise Psychiatric: appropriate affect, normal cadence to speech  MSK:   -Right lower extremity  Dressings over hip and thigh c/d/i EHL/TA/GSC intact Plantarflexes and dorsiflexes toes Sensation intact to light touch in sural, saphenous, tibial, deep peroneal, and superficial peroneal nerve distributions Foot warm and well perfused   Yesterday's total administered Morphine  Milligram Equivalents: 244.5   Patient name: Connor Bryant Patient MRN: [id number] Date: 09/25/2024

## 2024-09-25 NOTE — Assessment & Plan Note (Addendum)
-  Continue Crestor - plavix  resumed 1/3

## 2024-09-25 NOTE — Hospital Course (Signed)
 Connor Bryant is a 79 yo male with PMH CAD s/p stenting, afib s/p Wachman (no longer on Eliquis ), hx GIB, HFpEF, DMII who presented after a mechanical fall.  He was attempting to step on the floor switch for his Christmas tree light and after turning off the lights he lost his footing when stepping backwards and fell while twisting onto his right side. He had crawled across the floor to his phone to call for help after the fall. Imaging was notable for right femoral neck fracture. He was ultimately transferred to [facility] from [facility] and underwent ORIF with cephalomedullary rod on 09/24/2024.

## 2024-09-25 NOTE — Assessment & Plan Note (Signed)
 Continue Sinemet .

## 2024-09-25 NOTE — Assessment & Plan Note (Signed)
"   Continue PPI  "

## 2024-09-25 NOTE — Assessment & Plan Note (Signed)
-   Continue SSI and CBG monitoring ?

## 2024-09-25 NOTE — Progress Notes (Signed)
 " Progress Note    Connor Bryant   FMW:[id number]  DOB: [birth year]  DOA: 09/23/2024     1 PCP: Renato Dorothey [name], NP  Initial CC: Fall at home  [facility] Course: Mr. Rawlins is a 79 yo male with PMH CAD s/p stenting, afib s/p W[place]man (no longer on Eliquis ), hx GIB, HFpEF, DMII who presented after a mechanical fall.  He was attempting to step on the floor switch for his Christmas tree light and after turning off the lights he lost his footing when stepping backwards and fell while twisting onto his right side. He had crawled across the floor to his phone to call for help after the fall. Imaging was notable for right femoral neck fracture. He was ultimately transferred to [facility] from [facility] and underwent ORIF with cephalomedullary rod on 09/24/2024.  Interval History:  No events overnight.  Tolerated surgery well yesterday.  Still rather sore in his hip and has not yet worked with therapy yet.  Assessment and Plan: * Intertrochanteric fracture of right hip (HCC) - s/p mechanical fall at home - Sustained right femoral neck fracture after fall - Underwent ORIF with cephalomedullary rod on 09/24/2024 -Still having quite a bit of right hip pain as expected this morning - Has not yet worked with PT - Okay for resuming Plavix  per orthopedic surgery; holding off 1 more day while watching hemoglobin however -Repeat CBC in a.m.  Parkinsonism (HCC) - Continue Sinemet   Chronic kidney disease, stage 3b (HCC) - patient has history of CKD3b. Baseline creat ~ 1.5, eGFR~ 44   GERD (gastroesophageal reflux disease) - Continue PPI  HLD (hyperlipidemia) - Continue Zetia  and Crestor  Atrial fibrillation (HCC) - s/p Watchman device - no longer on anticoagulation  Coronary artery disease involving native coronary artery of native heart without angina pectoris - Hold Plavix  1 more day, follow-up hemoglobin in a.m. first -Continue Crestor  Type 2 diabetes mellitus without complication, without long-term  current use of insulin  (HCC) - Continue SSI and CBG monitoring  Essential hypertension Continue losartan  and hydralazine    Antimicrobials:   DVT prophylaxis:  SCDs Start: 09/24/24 0146   Code Status:   Code Status: Full Code  Mobility Assessment (Last 72 Hours)     Mobility Assessment     Row Name 09/25/24 1000 09/24/24 2000 09/24/24 1836       Does the patient have exclusion criteria? No- Perform mobility assessment No- Perform mobility assessment Yes- Hold (Level 0) - Assessment complete     What is the highest level of mobility based on the mobility assessment? Level 3 (Stands with assistance) - Balance while standing  and cannot march in place Level 1 (Bedfast) - Unable to balance while sitting on edge of bed --     Is the above level different from baseline mobility prior to current illness? -- Yes - Recommend PT order  PT ordered --        Diet: Diet Orders (From admission, onward)     Start     Ordered   09/24/24 2016  Diet Carb Modified  Diet effective now       Question Answer Comment  Calorie Level Medium 1600-2000   Fluid consistency: Thin      09/24/24 2015            Therapy evaluation: PT Orders: Active   PT Follow up Rec:   Barriers to discharge: None Disposition Plan: Pending PT eval Status is: Inpatient  Objective: Blood pressure (!) 116/54,  pulse 90, temperature 97.9 F (36.6 C), resp. rate 20, height 5' 10 (1.778 m), weight 83.9 kg, SpO2 98%.  Examination:  Physical Exam Constitutional:      General: He is not in acute distress.    Appearance: Normal appearance.  HENT:     Head: Normocephalic and atraumatic.     Mouth/Throat:     Mouth: Mucous membranes are moist.  Eyes:     Extraocular Movements: Extraocular movements intact.  Cardiovascular:     Rate and Rhythm: Normal rate and regular rhythm.  Pulmonary:     Effort: Pulmonary effort is normal. No respiratory distress.     Breath sounds: Normal breath sounds. No wheezing.   Abdominal:     General: Bowel sounds are normal. There is no distension.     Palpations: Abdomen is soft.     Tenderness: There is no abdominal tenderness.  Musculoskeletal:        General: Normal range of motion.     Cervical back: Normal range of motion and neck supple.     Right lower leg: Edema (Generalized edema along right hip down right thigh, compartments soft.  No obvious bruising noted.  Surgical dressings in place) present.  Skin:    General: Skin is warm and dry.  Neurological:     General: No focal deficit present.     Mental Status: He is alert.  Psychiatric:        Mood and Affect: Mood normal.        Behavior: Behavior normal.      Consultants:  Orthopedic surgery  Procedures:  Right peritrochanteric femur fracture ORIF with cephalomedullary rod: 09/24/2024  Data Reviewed: Results for orders placed or performed during the hospital encounter of 09/23/24 (from the past 24 hours)  Glucose, capillary     Status: Abnormal   Collection Time: 09/24/24  4:57 PM  Result Value Ref Range   Glucose-Capillary 149 (H) 70 - 99 mg/dL  Glucose, capillary     Status: Abnormal   Collection Time: 09/24/24  5:27 PM  Result Value Ref Range   Glucose-Capillary 149 (H) 70 - 99 mg/dL  Glucose, capillary     Status: Abnormal   Collection Time: 09/24/24  8:27 PM  Result Value Ref Range   Glucose-Capillary 182 (H) 70 - 99 mg/dL  Glucose, capillary     Status: Abnormal   Collection Time: 09/25/24 12:22 AM  Result Value Ref Range   Glucose-Capillary 184 (H) 70 - 99 mg/dL  CBC     Status: Abnormal   Collection Time: 09/25/24  1:17 AM  Result Value Ref Range   WBC 9.6 4.0 - 10.5 K/uL   RBC 3.14 (L) 4.22 - 5.81 MIL/uL   Hemoglobin 9.4 (L) 13.0 - 17.0 g/dL   HCT 71.2 (L) 60.9 - 47.9 %   MCV 91.4 80.0 - 100.0 fL   MCH 29.9 26.0 - 34.0 pg   MCHC 32.8 30.0 - 36.0 g/dL   RDW 85.3 88.4 - 84.4 %   Platelets 196 150 - 400 K/uL   nRBC 0.0 0.0 - 0.2 %  Basic metabolic panel     Status:  Abnormal   Collection Time: 09/25/24  1:17 AM  Result Value Ref Range   Sodium 136 135 - 145 mmol/L   Potassium 4.5 3.5 - 5.1 mmol/L   Chloride 102 98 - 111 mmol/L   CO2 24 22 - 32 mmol/L   Glucose, Bld 181 (H) 70 - 99 mg/dL   BUN  25 (H) 8 - 23 mg/dL   Creatinine, Ser 8.63 (H) 0.61 - 1.24 mg/dL   Calcium  8.8 (L) 8.9 - 10.3 mg/dL   GFR, Estimated 53 (L) >60 mL/min   Anion gap 10 5 - 15  Magnesium      Status: None   Collection Time: 09/25/24  1:17 AM  Result Value Ref Range   Magnesium  1.9 1.7 - 2.4 mg/dL  Phosphorus     Status: None   Collection Time: 09/25/24  1:17 AM  Result Value Ref Range   Phosphorus 3.7 2.5 - 4.6 mg/dL  Glucose, capillary     Status: Abnormal   Collection Time: 09/25/24  4:44 AM  Result Value Ref Range   Glucose-Capillary 156 (H) 70 - 99 mg/dL  Glucose, capillary     Status: Abnormal   Collection Time: 09/25/24  9:02 AM  Result Value Ref Range   Glucose-Capillary 123 (H) 70 - 99 mg/dL  Glucose, capillary     Status: Abnormal   Collection Time: 09/25/24 12:28 PM  Result Value Ref Range   Glucose-Capillary 166 (H) 70 - 99 mg/dL    I have reviewed pertinent nursing notes, vitals, labs, and images as necessary. I have ordered labwork to follow up on as indicated.  I have reviewed the last notes from staff over past 24 hours. I have discussed patient's care plan and test results with nursing staff, CM/SW, and other staff as appropriate.  Old records reviewed in assessment of this patient  Time spent: Greater than 50% of the 55 minute visit was spent in counseling/coordination of care for the patient as laid out in the A&P.   LOS: 1 day   Alm Apo, MD [facility] 09/25/2024, 1:12 PM "

## 2024-09-25 NOTE — Assessment & Plan Note (Signed)
Continue Zetia and Crestor

## 2024-09-25 NOTE — Plan of Care (Signed)

## 2024-09-25 NOTE — Plan of Care (Signed)
" °  Problem: Pain Managment: Goal: General experience of comfort will improve and/or be controlled Outcome: Progressing   Problem: Education: Goal: Knowledge of the prescribed therapeutic regimen will improve Outcome: Progressing   Problem: Neurological: Goal: Will regain or maintain usual level of consciousness Outcome: Progressing   Problem: Skin Integrity: Goal: Demonstrates signs of wound healing without infection Outcome: Progressing   "

## 2024-09-25 NOTE — Assessment & Plan Note (Signed)
-   patient has history of CKD3b. Baseline creat ~ 1.5, eGFR~ 44

## 2024-09-26 DIAGNOSIS — D62 Acute posthemorrhagic anemia: Secondary | ICD-10-CM

## 2024-09-26 DIAGNOSIS — S72144A Nondisplaced intertrochanteric fracture of right femur, initial encounter for closed fracture: Secondary | ICD-10-CM | POA: Diagnosis not present

## 2024-09-26 LAB — HEMOGLOBIN AND HEMATOCRIT, BLOOD
HCT: 24.5 % — ABNORMAL LOW (ref 39.0–52.0)
Hemoglobin: 8 g/dL — ABNORMAL LOW (ref 13.0–17.0)

## 2024-09-26 LAB — GLUCOSE, CAPILLARY
Glucose-Capillary: 151 mg/dL — ABNORMAL HIGH (ref 70–99)
Glucose-Capillary: 155 mg/dL — ABNORMAL HIGH (ref 70–99)
Glucose-Capillary: 137 mg/dL — ABNORMAL HIGH (ref 70–99)
Glucose-Capillary: 175 mg/dL — ABNORMAL HIGH (ref 70–99)

## 2024-09-26 MED ORDER — CLOPIDOGREL BISULFATE 75 MG PO TABS
75.0000 mg | ORAL_TABLET | Freq: Every day | ORAL | Status: DC
  Administered 2024-09-26 – 2024-10-01 (×6): 75 mg via ORAL
  Filled 2024-09-26 (×6): qty 1

## 2024-09-26 MED ORDER — PNEUMOCOCCAL 20-VAL CONJ VACC 0.5 ML IM SUSY
0.5000 mL | PREFILLED_SYRINGE | INTRAMUSCULAR | Status: AC
  Administered 2024-09-27: 0.5 mL via INTRAMUSCULAR
  Filled 2024-09-26: qty 0.5

## 2024-09-26 NOTE — Plan of Care (Signed)

## 2024-09-26 NOTE — TOC Initial Note (Signed)
 Transition of Care [facility]) - Initial/Assessment Note    Patient Details  Name: Connor Bryant MRN: [id number] Date of Birth: 03-01-1946  [facility] CM/SW Contact:    Almarie CHRISTELLA Goodie, LCSW Phone Number: 09/26/2024, 9:22 AM  Clinical Narrative:     Patient from home alone after a fall with right femoral neck fracture. CSW spoke with patient about recommendations for SNF, and patient is in agreement. Patient reported that he has been to [facility] in the past, and while he was happy with the rehab, he was not happy with the rest of the facility. Patient would like to consider other options. CSW completed referral and faxed out, will provide offers when available.   Expected Discharge Plan: Skilled Nursing Facility Barriers to Discharge: Continued Medical Work up, English As A Second Language Teacher   Patient Goals and CMS Choice Patient states their goals for this hospitalization and ongoing recovery are:: to get better and back home CMS Medicare.gov Compare Post Acute Care list provided to:: Patient Choice offered to / list presented to : Patient [place] ownership interest in [facility].provided to:: Patient    Expected Discharge Plan and Services     Post Acute Care Choice: Skilled Nursing Facility Living arrangements for the past 2 months: Single Family Home                                      Prior Living Arrangements/Services Living arrangements for the past 2 months: Single Family Home Lives with:: Self Patient language and need for interpreter reviewed:: No Do you feel safe going back to the place where you live?: Yes      Need for Family Participation in Patient Care: No (Comment) Care giver support system in place?: No (comment)   Criminal Activity/Legal Involvement Pertinent to Current Situation/Hospitalization: No - Comment as needed  Activities of Daily Living   ADL Screening (condition at time of admission) Independently performs ADLs?:  Yes (appropriate for developmental age) Is the patient deaf or have difficulty hearing?: No Does the patient have difficulty seeing, even when wearing glasses/contacts?: No Does the patient have difficulty concentrating, remembering, or making decisions?: No  Permission Sought/Granted Permission sought to share information with : Facility Industrial/product Designer granted to share information with : Yes, Verbal Permission Granted     Permission granted to share info w AGENCY: SNF        Emotional Assessment Appearance:: Appears stated age Attitude/Demeanor/Rapport: Engaged Affect (typically observed): Appropriate Orientation: : Oriented to Self, Oriented to Place, Oriented to  Time, Oriented to Situation Alcohol / Substance Use: Not Applicable Psych Involvement: No (comment)  Admission diagnosis:  Intertrochanteric fracture of right hip (HCC) [S72.141A] Closed displaced intertrochanteric fracture of right femur, initial encounter (HCC) [S72.141A] Patient Active Problem List   Diagnosis Date Noted   HLD (hyperlipidemia) 09/25/2024   GERD (gastroesophageal reflux disease) 09/25/2024   Chronic kidney disease, stage 3b (HCC) 09/25/2024   Parkinsonism (HCC) 09/25/2024   Intertrochanteric fracture of right hip (HCC) 09/24/2024   Atrial fibrillation (HCC) 07/23/2024   Presence of Watchman left atrial appendage closure device 07/23/2024   Melena 11/27/2023   GI bleeding 11/26/2023   Hyponatremia 11/26/2023   Hypokalemia 11/26/2023   Influenza A 11/26/2023   S/P coronary artery stent placement 09/25/2023   Nonrheumatic aortic valve stenosis 09/25/2023   Coronary artery disease involving native coronary artery of native heart without angina pectoris  09/15/2023   Acute on chronic systolic heart failure (HCC) 09/14/2023   CHF (congestive heart failure) (HCC) 09/11/2023   HFrEF (heart failure with reduced ejection fraction) (HCC) 08/21/2023   Acute on chronic anemia 08/18/2023    Anemia 08/18/2023   On apixaban  therapy 08/18/2023   GI bleed 08/17/2023   GIB (gastrointestinal bleeding) 08/17/2023   AKI (acute kidney injury) 08/17/2023   Respiratory distress 08/17/2023   Rheumatoid arthritis involving multiple sites with positive rheumatoid factor (HCC) 06/25/2023   Diabetes mellitus treated with oral medication (HCC) 06/03/2023   Non-ST elevation (NSTEMI) myocardial infarction [facility])    Acute systolic heart failure (HCC)    Elevated troponin 05/03/2020   Fever 05/03/2020   PAF (paroxysmal atrial fibrillation) (HCC) 05/02/2020   S/P insertion of iliac artery stent 05/01/2020   Diabetic lipidosis (HCC) 03/10/2020   Intermittent claudication 03/10/2020   Compression fracture of T12 vertebra (HCC) 10/27/2018   Spinal stenosis of lumbosacral region 10/27/2018   Small bowel obstruction (HCC) 09/30/2018   Back pain of lumbar region with sciatica 08/26/2018   Chronic left-sided low back pain with left-sided sciatica 08/26/2018   Tremor 08/26/2018   Pure hypercholesterolemia 02/25/2018   Atherosclerosis of aorta 02/24/2018   Bilateral hip pain 06/24/2017   Body mass index 30.0-30.9, adult 06/06/2016   Chronic gout with tophus 06/06/2016   Essential hypertension 06/06/2016   Type 2 diabetes mellitus without complication, without long-term current use of insulin  (HCC) 06/06/2016   Hypertension 06/06/2016   Chronic mesenteric ischemia 03/01/2014   Peripheral vascular disease (HCC) 02/16/2014   Superior mesenteric artery stenosis 02/16/2014   History of colonic polyps 11/24/2013   PCP:  Renato Dorothey [name], NP Pharmacy:   [facility] [place], [place] - [address] [place] [address] Phone: [phone] Fax: [phone]     Social Drivers of Health (SDOH) Social History: SDOH Screenings   Food Insecurity: No Food Insecurity (09/24/2024)  Housing: Low Risk (09/24/2024)  Transportation Needs: No Transportation Needs  (09/24/2024)  Utilities: Not At Risk (09/24/2024)  Alcohol Screen: Low Risk (12/04/2023)  Depression (PHQ2-9): Low Risk (05/28/2024)  Financial Resource Strain: Low Risk (12/09/2023)  Physical Activity: Inactive (12/04/2023)  Social Connections: Moderately Isolated (09/24/2024)  Stress: No Stress Concern Present (12/09/2023)  Tobacco Use: Medium Risk (09/24/2024)  Health Literacy: Adequate Health Literacy (12/09/2023)   SDOH Interventions:     Readmission Risk Interventions    12/02/2023   10:06 AM 11/29/2023   10:09 AM 11/27/2023    8:43 AM  Readmission Risk Prevention Plan  Transportation Screening Complete Complete Complete  HRI or Home Care Consult  Complete Complete  Social Work Consult for Recovery Care Planning/Counseling  Complete Complete  Palliative Care Screening  Not Applicable Not Applicable  Medication Review Oceanographer) Complete Complete Complete  HRI or Home Care Consult Complete    SW Recovery Care/Counseling Consult Complete    Palliative Care Screening Not Applicable    Skilled Nursing Facility Not Applicable

## 2024-09-26 NOTE — Progress Notes (Signed)
 PT Cancellation Note  Patient Details Name: Connor Bryant MRN: [id number] DOB: 1946-09-06   Cancelled Treatment:    Reason Eval/Treat Not Completed: Patient declined, no reason specified  Reports this is a late time for therapy to visit. Having a lot of pain. Will attempt to come earlier next opportunity we get but cannot always schedule patient visit times. Encouraged OOB with staff over the weekend to prevent secondary complications associated with immobility.  Leontine Roads, PT, DPT [facility]  Rehabilitation Services Physical Therapist Office: [phone] Website: [email or web address]   Leontine GORMAN Roads 09/26/2024, 3:59 PM

## 2024-09-26 NOTE — Anesthesia Postprocedure Evaluation (Signed)
"   Anesthesia Post Note  Patient: Connor Bryant  Procedure(s) Performed: FIXATION, FRACTURE, INTERTROCHANTERIC, WITH INTRAMEDULLARY ROD (Right: Leg Upper)     Patient location during evaluation: PACU Anesthesia Type: General Level of consciousness: awake and alert Pain management: pain level controlled Vital Signs Assessment: post-procedure vital signs reviewed and stable Respiratory status: spontaneous breathing, nonlabored ventilation, respiratory function stable and patient connected to nasal cannula oxygen Cardiovascular status: blood pressure returned to baseline and stable Postop Assessment: no apparent nausea or vomiting Anesthetic complications: no   No notable events documented.  Last Vitals:  Vitals:   09/26/24 0601 09/26/24 0725  BP: (!) 125/57 (!) 123/55  Pulse: 82 83  Resp:    Temp: 36.7 C 36.6 C  SpO2: 98% 97%    Last Pain:  Vitals:   09/26/24 0653  TempSrc:   PainSc: 3                  Ralyn Stlaurent S      "

## 2024-09-26 NOTE — Progress Notes (Signed)
 Orthopedic Surgery Progress Note   Assessment: Patient is a 79 y.o. male with right peritrochanteric femur fracture status post CMN   Plan: -Operative plans: complete -Diet: diabetic -DVT ppx: can resume home plavix  -Antibiotics: ancef  x2 post-op doses -Weight bearing status: as tolerated -PT evaluate and treat -Pain control -Dispo: per primary  ___________________________________________________________________________  Subjective: No acute events overnight. Has significant groin pain when trying to move. Pain manageable when he is in bed.    Physical Exam:  General: no acute distress, appears stated age Neurologic: alert, answering questions appropriately, following commands Respiratory: unlabored breathing on room air, symmetric chest rise Psychiatric: appropriate affect, normal cadence to speech  MSK:   -Right lower extremity  Dressings over hip and thigh c/d/i EHL/TA/GSC intact Plantarflexes and dorsiflexes toes Sensation intact to light touch in sural, saphenous, tibial, deep peroneal, and superficial peroneal nerve distributions Foot warm and well perfused   Yesterday's total administered Morphine  Milligram Equivalents: 60   Patient name: Connor Bryant Patient MRN: [id number] Date: 09/26/2024

## 2024-09-26 NOTE — Assessment & Plan Note (Signed)
-   s/p surgery - soft RLE but swollen as expected - trend H/H as needed - plavix  resumed 1/3 - Hgb stable

## 2024-09-26 NOTE — Plan of Care (Signed)
 Patient calm and cooperative medication tolerated well. Patient left with call bell in reach and bed in lowest position.  Problem: Education: Goal: Ability to describe self-care measures that may prevent or decrease complications (Diabetes Survival Skills Education) will improve Outcome: Progressing   Problem: Coping: Goal: Ability to adjust to condition or change in health will improve Outcome: Progressing   Problem: Health Behavior/Discharge Planning: Goal: Ability to identify and utilize available resources and services will improve Outcome: Progressing   Problem: Nutritional: Goal: Maintenance of adequate nutrition will improve Outcome: Progressing   Problem: Skin Integrity: Goal: Risk for impaired skin integrity will decrease Outcome: Progressing   Problem: Health Behavior/Discharge Planning: Goal: Ability to manage health-related needs will improve Outcome: Progressing   Problem: Activity: Goal: Risk for activity intolerance will decrease Outcome: Progressing   Problem: Nutrition: Goal: Adequate nutrition will be maintained Outcome: Progressing   Problem: Coping: Goal: Level of anxiety will decrease Outcome: Progressing   Problem: Elimination: Goal: Will not experience complications related to bowel motility Outcome: Progressing   Problem: Pain Managment: Goal: General experience of comfort will improve and/or be controlled Outcome: Progressing   Problem: Safety: Goal: Ability to remain free from injury will improve Outcome: Progressing   Problem: Skin Integrity: Goal: Risk for impaired skin integrity will decrease Outcome: Progressing   Problem: Clinical Measurements: Goal: Ability to maintain clinical measurements within normal limits Outcome: Progressing

## 2024-09-26 NOTE — Progress Notes (Signed)
 " Progress Note    [name]   FMW:[id number]  DOB: [birth year]  DOA: 09/23/2024     2 PCP: Renato Dorothey [name], NP  Initial CC: Fall at [facility] Course: Connor Bryant is a 79 yo male with PMH CAD s/p stenting, afib s/p Wachman (no longer on Eliquis ), hx GIB, HFpEF, DMII who presented after a mechanical fall.  He was attempting to step on the floor switch for his Christmas tree light and after turning off the lights he lost his footing when stepping backwards and fell while twisting onto his right side. He had crawled across the floor to his phone to call for help after the fall. Imaging was notable for right femoral neck fracture. He was ultimately transferred to [facility] from [facility] and underwent ORIF with cephalomedullary rod on 09/24/2024.  Interval History:  No events overnight. Pain still present as expected. Couldn't do much with PT but at least attempted yesterday. He understands he'll need SNF at discharge for some therapy and he's okay with that.   Assessment and Plan: * Intertrochanteric fracture of right hip (HCC) - s/p mechanical fall at home - Sustained right femoral neck fracture after fall - Underwent ORIF with cephalomedullary rod on 09/24/2024 -Still having quite a bit of right hip pain as expected - continue with PT; rec'd for SNF; patient amenable  ABLA (acute blood loss anemia) - s/p surgery - soft RLE but swollen as expected - trend H/H as needed - plavix  resumed 1/3  Coronary artery disease involving native coronary artery of native heart without angina pectoris -Continue Crestor - plavix  resumed 1/3  Atrial fibrillation (HCC) - s/p Watchman device - no longer on anticoagulation  Parkinsonism (HCC) - Continue Sinemet   Chronic kidney disease, stage 3b (HCC) - patient has history of CKD3b. Baseline creat ~ 1.5, eGFR~ 44   GERD (gastroesophageal reflux disease) - Continue PPI  HLD (hyperlipidemia) - Continue Zetia  and Crestor  Type 2 diabetes mellitus  without complication, without long-term current use of insulin  (HCC) - Continue SSI and CBG monitoring  Essential hypertension Continue losartan  and hydralazine    Antimicrobials:   DVT prophylaxis:  SCDs Start: 09/24/24 0146   Code Status:   Code Status: Full Code  Mobility Assessment (Last 72 Hours)     Mobility Assessment     Row Name 09/25/24 2100 09/25/24 1601 09/25/24 1000 09/24/24 2000 09/24/24 1836   Does the patient have exclusion criteria? No- Perform mobility assessment -- No- Perform mobility assessment No- Perform mobility assessment Yes- Hold (Level 0) - Assessment complete   What is the highest level of mobility based on the mobility assessment? Level 1 (Bedfast) - Unable to balance while sitting on edge of bed Level 2 (Chairfast) - Balance while sitting on edge of bed and cannot stand Level 3 (Stands with assistance) - Balance while standing  and cannot march in place Level 1 (Bedfast) - Unable to balance while sitting on edge of bed --   Is the above level different from baseline mobility prior to current illness? Yes - Recommend PT order -- -- Yes - Recommend PT order  PT ordered --      Diet: Diet Orders (From admission, onward)     Start     Ordered   09/24/24 2016  Diet Carb Modified  Diet effective now       Question Answer Comment  Calorie Level Medium 1600-2000   Fluid consistency: Thin      09/24/24 2015  Therapy evaluation: PT Orders: Active   PT Follow up Rec: Skilled Nursing-Short Term Rehab (<3 Hours/Day)09/25/2024 1601  Barriers to discharge: None Disposition Plan: SNF Status is: Inpatient  Objective: Blood pressure (!) 147/53, pulse 87, temperature 98 F (36.7 C), resp. rate 20, height 5' 10 (1.778 m), weight 83.9 kg, SpO2 100%.  Examination:  Physical Exam Constitutional:      General: He is not in acute distress.    Appearance: Normal appearance.  HENT:     Head: Normocephalic and atraumatic.     Mouth/Throat:      Mouth: Mucous membranes are moist.  Eyes:     Extraocular Movements: Extraocular movements intact.  Cardiovascular:     Rate and Rhythm: Normal rate and regular rhythm.  Pulmonary:     Effort: Pulmonary effort is normal. No respiratory distress.     Breath sounds: Normal breath sounds. No wheezing.  Abdominal:     General: Bowel sounds are normal. There is no distension.     Palpations: Abdomen is soft.     Tenderness: There is no abdominal tenderness.  Musculoskeletal:        General: Normal range of motion.     Cervical back: Normal range of motion and neck supple.     Right lower leg: Edema (Generalized edema along right hip down right thigh, compartments soft.  No obvious bruising noted.  Surgical dressings in place) present.  Skin:    General: Skin is warm and dry.  Neurological:     General: No focal deficit present.     Mental Status: He is alert.  Psychiatric:        Mood and Affect: Mood normal.        Behavior: Behavior normal.      Consultants:  Orthopedic surgery  Procedures:  Right peritrochanteric femur fracture ORIF with cephalomedullary rod: 09/24/2024  Data Reviewed: Results for orders placed or performed during the hospital encounter of 09/23/24 (from the past 24 hours)  Glucose, capillary     Status: Abnormal   Collection Time: 09/25/24  4:22 PM  Result Value Ref Range   Glucose-Capillary 153 (H) 70 - 99 mg/dL  Glucose, capillary     Status: Abnormal   Collection Time: 09/25/24  9:48 PM  Result Value Ref Range   Glucose-Capillary 170 (H) 70 - 99 mg/dL  Glucose, capillary     Status: Abnormal   Collection Time: 09/26/24  7:23 AM  Result Value Ref Range   Glucose-Capillary 151 (H) 70 - 99 mg/dL  Hemoglobin and hematocrit, blood     Status: Abnormal   Collection Time: 09/26/24 11:03 AM  Result Value Ref Range   Hemoglobin 8.0 (L) 13.0 - 17.0 g/dL   HCT 75.4 (L) 60.9 - 47.9 %  Glucose, capillary     Status: Abnormal   Collection Time: 09/26/24 12:20  PM  Result Value Ref Range   Glucose-Capillary 155 (H) 70 - 99 mg/dL    I have reviewed pertinent nursing notes, vitals, labs, and images as necessary. I have ordered labwork to follow up on as indicated.  I have reviewed the last notes from staff over past 24 hours. I have discussed patient's care plan and test results with nursing staff, CM/SW, and other staff as appropriate.  Old records reviewed in assessment of this patient  Time spent: Greater than 50% of the 55 minute visit was spent in counseling/coordination of care for the patient as laid out in the A&P.   LOS: 2 days  Alm Apo, MD [facility] 09/26/2024, 2:53 PM "

## 2024-09-26 NOTE — NC FL2 (Signed)
 " [place]  MEDICAID FL2 LEVEL OF CARE FORM     IDENTIFICATION  Patient Name: Connor Bryant Birthdate: Jun 04, 1946 Sex: male Admission Date (Current Location): 09/23/2024  [facility] and [place] Number:  [employer] and Address:  [facility], [address] [place], [place] [address]      Provider Number: [id number]  Attending Physician Name and Address:  Patsy Lenis, MD  Relative Name and Phone Number:       Current Level of Care: Hospital Recommended Level of Care: Skilled Nursing Facility Prior Approval Number:    Date Approved/Denied:   PASRR Number: [id number]  Discharge Plan: SNF    Current Diagnoses: Patient Active Problem List   Diagnosis Date Noted   HLD (hyperlipidemia) 09/25/2024   GERD (gastroesophageal reflux disease) 09/25/2024   Chronic kidney disease, stage 3b (HCC) 09/25/2024   Parkinsonism (HCC) 09/25/2024   Intertrochanteric fracture of right hip (HCC) 09/24/2024   Atrial fibrillation (HCC) 07/23/2024   Presence of Watchman left atrial appendage closure device 07/23/2024   Melena 11/27/2023   GI bleeding 11/26/2023   Hyponatremia 11/26/2023   Hypokalemia 11/26/2023   Influenza A 11/26/2023   S/P coronary artery stent placement 09/25/2023   Nonrheumatic aortic valve stenosis 09/25/2023   Coronary artery disease involving native coronary artery of native heart without angina pectoris 09/15/2023   Acute on chronic systolic heart failure (HCC) 09/14/2023   CHF (congestive heart failure) (HCC) 09/11/2023   HFrEF (heart failure with reduced ejection fraction) (HCC) 08/21/2023   Acute on chronic anemia 08/18/2023   Anemia 08/18/2023   On apixaban  therapy 08/18/2023   GI bleed 08/17/2023   GIB (gastrointestinal bleeding) 08/17/2023   AKI (acute kidney injury) 08/17/2023   Respiratory distress 08/17/2023   Rheumatoid arthritis involving multiple sites with positive rheumatoid factor (HCC) 06/25/2023   Diabetes  mellitus treated with oral medication (HCC) 06/03/2023   Non-ST elevation (NSTEMI) myocardial infarction [facility])    Acute systolic heart failure (HCC)    Elevated troponin 05/03/2020   Fever 05/03/2020   PAF (paroxysmal atrial fibrillation) (HCC) 05/02/2020   S/P insertion of iliac artery stent 05/01/2020   Diabetic lipidosis (HCC) 03/10/2020   Intermittent claudication 03/10/2020   Compression fracture of T12 vertebra (HCC) 10/27/2018   Spinal stenosis of lumbosacral region 10/27/2018   Small bowel obstruction (HCC) 09/30/2018   Back pain of lumbar region with sciatica 08/26/2018   Chronic left-sided low back pain with left-sided sciatica 08/26/2018   Tremor 08/26/2018   Pure hypercholesterolemia 02/25/2018   Atherosclerosis of aorta 02/24/2018   Bilateral hip pain 06/24/2017   Body mass index 30.0-30.9, adult 06/06/2016   Chronic gout with tophus 06/06/2016   Essential hypertension 06/06/2016   Type 2 diabetes mellitus without complication, without long-term current use of insulin  (HCC) 06/06/2016   Hypertension 06/06/2016   Chronic mesenteric ischemia 03/01/2014   Peripheral vascular disease (HCC) 02/16/2014   Superior mesenteric artery stenosis 02/16/2014   History of colonic polyps 11/24/2013    Orientation RESPIRATION BLADDER Height & Weight     Self, Time, Situation, Place  Normal Continent Weight: 185 lb (83.9 kg) Height:  5' 10 (177.8 cm)  BEHAVIORAL SYMPTOMS/MOOD NEUROLOGICAL BOWEL NUTRITION STATUS      Continent Diet (carb modified)  AMBULATORY STATUS COMMUNICATION OF NEEDS Skin   Extensive Assist Verbally Surgical wounds (closed right leg, gauze and transparent dressing)  Personal Care Assistance Level of Assistance  Bathing, Feeding, Dressing Bathing Assistance: Maximum assistance Feeding assistance: Independent Dressing Assistance: Maximum assistance     Functional Limitations Info  Hearing, Sight Sight Info: Impaired (reading  glasses) Hearing Info: Impaired      SPECIAL CARE FACTORS FREQUENCY  PT (By licensed PT), OT (By licensed OT)     PT Frequency: 5x/wk OT Frequency: 5x/wk            Contractures Contractures Info: Not present    Additional Factors Info  Code Status, Allergies, Insulin  Sliding Scale Code Status Info: Full Allergies Info: NKA   Insulin  Sliding Scale Info: see DC summary       Current Medications (09/26/2024):  This is the current hospital active medication list Current Facility-Administered Medications  Medication Dose Route Frequency Provider Last Rate Last Admin   acetaminophen  (TYLENOL ) tablet 1,000 mg  1,000 mg Oral Q8H Georgina Ozell LABOR, MD       allopurinol  (ZYLOPRIM ) tablet 100 mg  100 mg Oral BID Moore, Michael A, MD   100 mg at 09/25/24 2144   atorvastatin  (LIPITOR ) tablet 80 mg  80 mg Oral Daily Moore, Michael A, MD   80 mg at 09/25/24 9096   carbidopa -levodopa  (SINEMET  IR) 10-100 MG per tablet immediate release 1 tablet  1 tablet Oral TID Georgina Ozell LABOR, MD   1 tablet at 09/25/24 2144   ezetimibe  (ZETIA ) tablet 10 mg  10 mg Oral Daily Moore, Michael A, MD   10 mg at 09/25/24 9096   feeding supplement (GLUCERNA SHAKE) (GLUCERNA SHAKE) liquid 237 mL  237 mL Oral BID BM Moore, Michael A, MD   237 mL at 09/25/24 9096   hydrALAZINE  (APRESOLINE ) tablet 50 mg  50 mg Oral TID Moore, Michael A, MD   50 mg at 09/25/24 2144   Influenza vac split trivalent PF (FLUZONE  HIGH-DOSE) injection 0.5 mL  0.5 mL Intramuscular Tomorrow-1000 Akula, Vijaya, MD       insulin  aspart (novoLOG ) injection 0-9 Units  0-9 Units Subcutaneous TID AC & HS Patsy Lenis, MD   2 Units at 09/26/24 0857   losartan  (COZAAR ) tablet 50 mg  50 mg Oral Daily Moore, Michael A, MD   50 mg at 09/25/24 9096   melatonin tablet 5 mg  5 mg Oral QHS PRN Moore, Michael A, MD   5 mg at 09/25/24 2143   oxyCODONE  (Oxy IR/ROXICODONE ) immediate release tablet 5-10 mg  5-10 mg Oral Q4H PRN Moore, Michael A, MD   10 mg at  09/26/24 9376   pantoprazole  (PROTONIX ) EC tablet 40 mg  40 mg Oral BID Moore, Michael A, MD   40 mg at 09/25/24 2144   polyethylene glycol (MIRALAX  / GLYCOLAX ) packet 17 g  17 g Oral Daily PRN Georgina Ozell LABOR, MD       prochlorperazine  (COMPAZINE ) injection 5 mg  5 mg Intravenous Q6H PRN Moore, Michael A, MD         Discharge Medications: Please see discharge summary for a list of discharge medications.  Relevant Imaging Results:  Relevant Lab Results:   Additional Information SS#: [id number]  Almarie CHRISTELLA Goodie, LCSW     "

## 2024-09-27 DIAGNOSIS — S72144A Nondisplaced intertrochanteric fracture of right femur, initial encounter for closed fracture: Secondary | ICD-10-CM | POA: Diagnosis not present

## 2024-09-27 DIAGNOSIS — D62 Acute posthemorrhagic anemia: Secondary | ICD-10-CM | POA: Diagnosis not present

## 2024-09-27 LAB — CBC WITH DIFFERENTIAL/PLATELET
Abs Immature Granulocytes: 0.04 K/uL (ref 0.00–0.07)
Basophils Absolute: 0 K/uL (ref 0.0–0.1)
Basophils Relative: 0 %
Eosinophils Absolute: 0.3 K/uL (ref 0.0–0.5)
Eosinophils Relative: 4 %
HCT: 23.9 % — ABNORMAL LOW (ref 39.0–52.0)
Hemoglobin: 8 g/dL — ABNORMAL LOW (ref 13.0–17.0)
Immature Granulocytes: 1 %
Lymphocytes Relative: 20 %
Lymphs Abs: 1.6 K/uL (ref 0.7–4.0)
MCH: 30.1 pg (ref 26.0–34.0)
MCHC: 33.5 g/dL (ref 30.0–36.0)
MCV: 89.8 fL (ref 80.0–100.0)
Monocytes Absolute: 0.8 K/uL (ref 0.1–1.0)
Monocytes Relative: 9 %
Neutro Abs: 5.4 K/uL (ref 1.7–7.7)
Neutrophils Relative %: 66 %
Platelets: 182 K/uL (ref 150–400)
RBC: 2.66 MIL/uL — ABNORMAL LOW (ref 4.22–5.81)
RDW: 14.3 % (ref 11.5–15.5)
WBC: 8.1 K/uL (ref 4.0–10.5)
nRBC: 0 % (ref 0.0–0.2)

## 2024-09-27 LAB — GLUCOSE, CAPILLARY
Glucose-Capillary: 140 mg/dL — ABNORMAL HIGH (ref 70–99)
Glucose-Capillary: 166 mg/dL — ABNORMAL HIGH (ref 70–99)
Glucose-Capillary: 145 mg/dL — ABNORMAL HIGH (ref 70–99)
Glucose-Capillary: 159 mg/dL — ABNORMAL HIGH (ref 70–99)

## 2024-09-27 MED ORDER — SENNA 8.6 MG PO TABS: 1.0000 | ORAL_TABLET | Freq: Two times a day (BID) | ORAL | 0 refills | Status: AC

## 2024-09-27 MED ORDER — POLYETHYLENE GLYCOL 3350 17 G PO PACK: 17.0000 g | PACK | Freq: Every day | ORAL | 0 refills | Status: AC

## 2024-09-27 MED ORDER — ACETAMINOPHEN 500 MG PO TABS: 1000.0000 mg | ORAL_TABLET | Freq: Three times a day (TID) | ORAL | 0 refills | Status: AC

## 2024-09-27 MED ORDER — OXYCODONE HCL 5 MG PO TABS: 5.0000 mg | ORAL_TABLET | ORAL | 0 refills | Status: AC | PRN

## 2024-09-27 NOTE — Plan of Care (Signed)

## 2024-09-27 NOTE — Progress Notes (Signed)
 " Progress Note    Connor Bryant   FMW:[id number]  DOB: April 18, 1946  DOA: 09/23/2024     3 PCP: Renato Dorothey [name], NP  Initial CC: Fall at home  [facility] Course: Connor Bryant is a 79 yo male with PMH CAD s/p stenting, afib s/p Wachman (no longer on Eliquis ), hx GIB, HFpEF, DMII who presented after a mechanical fall.  He was attempting to step on the floor switch for his Christmas tree light and after turning off the lights he lost his footing when stepping backwards and fell while twisting onto his right side. He had crawled across the floor to his phone to call for help after the fall. Imaging was notable for right femoral neck fracture. He was ultimately transferred to [facility] from [facility] and underwent ORIF with cephalomedullary rod on 09/24/2024.  Interval History:  No events overnight.  Was in too much pain to work with PT yesterday. Otherwise doing okay today.  Trying to move more in bed today and appears to be ready to work with PT tomorrow he hopes.  Assessment and Plan: * Intertrochanteric fracture of right hip (HCC) - s/p mechanical fall at home - Sustained right femoral neck fracture after fall - Underwent ORIF with cephalomedullary rod on 09/24/2024 -Still having quite a bit of right hip pain as expected - continue with PT; rec'd for SNF; patient amenable  ABLA (acute blood loss anemia) - s/p surgery - soft RLE but swollen as expected - trend H/H as needed - plavix  resumed 1/3 - Hgb stable on 1/4  Coronary artery disease involving native coronary artery of native heart without angina pectoris -Continue Crestor - plavix  resumed 1/3  Atrial fibrillation (HCC) - s/p Watchman device - no longer on anticoagulation  Parkinsonism (HCC) - Continue Sinemet   Chronic kidney disease, stage 3b (HCC) - patient has history of CKD3b. Baseline creat ~ 1.5, eGFR~ 44   GERD (gastroesophageal reflux disease) - Continue PPI  HLD (hyperlipidemia) - Continue Zetia  and Crestor  Type 2  diabetes mellitus without complication, without long-term current use of insulin  (HCC) - Continue SSI and CBG monitoring  Essential hypertension Continue losartan  and hydralazine    Antimicrobials:   DVT prophylaxis:  SCDs Start: 09/24/24 0146 Plavix    Code Status:   Code Status: Full Code  Mobility Assessment (Last 72 Hours)     Mobility Assessment     Row Name 09/27/24 1000 09/26/24 2030 09/26/24 0945 09/25/24 2100 09/25/24 1601   Does the patient have exclusion criteria? No- Perform mobility assessment No- Perform mobility assessment No- Perform mobility assessment No- Perform mobility assessment --   What is the highest level of mobility based on the mobility assessment? Level 1 (Bedfast) - Unable to balance while sitting on edge of bed Level 1 (Bedfast) - Unable to balance while sitting on edge of bed Level 1 (Bedfast) - Unable to balance while sitting on edge of bed Level 1 (Bedfast) - Unable to balance while sitting on edge of bed Level 2 (Chairfast) - Balance while sitting on edge of bed and cannot stand   Is the above level different from baseline mobility prior to current illness? Yes - Recommend PT order -- Yes - Recommend PT order Yes - Recommend PT order --    Row Name 09/25/24 1000 09/24/24 2000 09/24/24 1836       Does the patient have exclusion criteria? No- Perform mobility assessment No- Perform mobility assessment Yes- Hold (Level 0) - Assessment complete     What  is the highest level of mobility based on the mobility assessment? Level 3 (Stands with assistance) - Balance while standing  and cannot march in place Level 1 (Bedfast) - Unable to balance while sitting on edge of bed --     Is the above level different from baseline mobility prior to current illness? -- Yes - Recommend PT order  PT ordered --        Diet: Diet Orders (From admission, onward)     Start     Ordered   09/24/24 2016  Diet Carb Modified  Diet effective now       Question Answer Comment   Calorie Level Medium 1600-2000   Fluid consistency: Thin      09/24/24 2015            Therapy evaluation: PT Orders: Active   PT Follow up Rec: Skilled Nursing-Short Term Rehab (<3 Hours/Day)09/25/2024 1601  Barriers to discharge: None Disposition Plan: SNF Status is: Inpatient  Objective: Blood pressure (!) 156/61, pulse 82, temperature 98 F (36.7 C), temperature source Oral, resp. rate 16, height 5' 10 (1.778 m), weight 83.9 kg, SpO2 100%.  Examination:  Physical Exam Constitutional:      General: He is not in acute distress.    Appearance: Normal appearance.  HENT:     Head: Normocephalic and atraumatic.     Mouth/Throat:     Mouth: Mucous membranes are moist.  Eyes:     Extraocular Movements: Extraocular movements intact.  Cardiovascular:     Rate and Rhythm: Normal rate and regular rhythm.  Pulmonary:     Effort: Pulmonary effort is normal. No respiratory distress.     Breath sounds: Normal breath sounds. No wheezing.  Abdominal:     General: Bowel sounds are normal. There is no distension.     Palpations: Abdomen is soft.     Tenderness: There is no abdominal tenderness.  Musculoskeletal:        General: Normal range of motion.     Cervical back: Normal range of motion and neck supple.     Right lower leg: Edema (Generalized edema along right hip down right thigh, compartments soft.  No obvious bruising noted.  Surgical dressings in place) present.  Skin:    General: Skin is warm and dry.  Neurological:     General: No focal deficit present.     Mental Status: He is alert.  Psychiatric:        Mood and Affect: Mood normal.        Behavior: Behavior normal.      Consultants:  Orthopedic surgery  Procedures:  Right peritrochanteric femur fracture ORIF with cephalomedullary rod: 09/24/2024  Data Reviewed: Results for orders placed or performed during the hospital encounter of 09/23/24 (from the past 24 hours)  Glucose, capillary     Status:  Abnormal   Collection Time: 09/26/24  5:14 PM  Result Value Ref Range   Glucose-Capillary 137 (H) 70 - 99 mg/dL  Glucose, capillary     Status: Abnormal   Collection Time: 09/26/24  8:00 PM  Result Value Ref Range   Glucose-Capillary 175 (H) 70 - 99 mg/dL  CBC with Differential/Platelet     Status: Abnormal   Collection Time: 09/27/24  5:38 AM  Result Value Ref Range   WBC 8.1 4.0 - 10.5 K/uL   RBC 2.66 (L) 4.22 - 5.81 MIL/uL   Hemoglobin 8.0 (L) 13.0 - 17.0 g/dL   HCT 76.0 (L) 60.9 - 47.9 %  MCV 89.8 80.0 - 100.0 fL   MCH 30.1 26.0 - 34.0 pg   MCHC 33.5 30.0 - 36.0 g/dL   RDW 85.6 88.4 - 84.4 %   Platelets 182 150 - 400 K/uL   nRBC 0.0 0.0 - 0.2 %   Neutrophils Relative % 66 %   Neutro Abs 5.4 1.7 - 7.7 K/uL   Lymphocytes Relative 20 %   Lymphs Abs 1.6 0.7 - 4.0 K/uL   Monocytes Relative 9 %   Monocytes Absolute 0.8 0.1 - 1.0 K/uL   Eosinophils Relative 4 %   Eosinophils Absolute 0.3 0.0 - 0.5 K/uL   Basophils Relative 0 %   Basophils Absolute 0.0 0.0 - 0.1 K/uL   Immature Granulocytes 1 %   Abs Immature Granulocytes 0.04 0.00 - 0.07 K/uL  Glucose, capillary     Status: Abnormal   Collection Time: 09/27/24  8:07 AM  Result Value Ref Range   Glucose-Capillary 140 (H) 70 - 99 mg/dL  Glucose, capillary     Status: Abnormal   Collection Time: 09/27/24 12:47 PM  Result Value Ref Range   Glucose-Capillary 166 (H) 70 - 99 mg/dL    I have reviewed pertinent nursing notes, vitals, labs, and images as necessary. I have ordered labwork to follow up on as indicated.  I have reviewed the last notes from staff over past 24 hours. I have discussed patient's care plan and test results with nursing staff, CM/SW, and other staff as appropriate.  Old records reviewed in assessment of this patient    LOS: 3 days   Alm Apo, MD [facility] 09/27/2024, 12:59 PM "

## 2024-09-28 DIAGNOSIS — S72144A Nondisplaced intertrochanteric fracture of right femur, initial encounter for closed fracture: Secondary | ICD-10-CM | POA: Diagnosis not present

## 2024-09-28 DIAGNOSIS — D62 Acute posthemorrhagic anemia: Secondary | ICD-10-CM | POA: Diagnosis not present

## 2024-09-28 LAB — GLUCOSE, CAPILLARY
Glucose-Capillary: 153 mg/dL — ABNORMAL HIGH (ref 70–99)
Glucose-Capillary: 153 mg/dL — ABNORMAL HIGH (ref 70–99)
Glucose-Capillary: 171 mg/dL — ABNORMAL HIGH (ref 70–99)
Glucose-Capillary: 139 mg/dL — ABNORMAL HIGH (ref 70–99)

## 2024-09-28 NOTE — Care Management Important Message (Signed)
 Important Message  Patient Details  Name: Connor Bryant MRN: [id number] Date of Birth: Aug 18, 1946   Important Message Given:  Yes - Medicare IM     Claretta Deed 09/28/2024, 2:19 PM

## 2024-09-28 NOTE — Progress Notes (Signed)
 Physical Therapy Treatment Patient Details Name: Connor Bryant: [id number] DOB: Feb 07, 1946 Today's Date: 09/28/2024   History of Present Illness Pt is 79 yo presenting to [facility] on 1/1 due to fall. Currently pt is s/p ORIF with cephalomedullary rod on 09/24/2024. PMH: CAD s/p stenting, afib s/p watchman, GIB, HFpEF, DM II.    PT Comments  Pt is slowly progressing towards goals. Pt has a high fear of falling and anticipates pain. Pt was Mod A for bed mobility today, Mod to Max A for squat pivot transfer from EOB > BSC > recliner. Requires multi modal cues for sequencing with improved command following this session for safety. Due to pt current functional status, home set up and available assistance at home recommending skilled physical therapy services < 3 hours/day in order to address strength, balance and functional mobility to decrease risk for falls, injury, immobility, skin break down and re-hospitalization.      If plan is discharge home, recommend the following: Assist for transportation;Assistance with cooking/housework   Can travel by private vehicle     No  Equipment Recommendations  Wheelchair cushion (measurements PT);Wheelchair (measurements PT);Hospital bed       Precautions / Restrictions Precautions Precautions: Fall Recall of Precautions/Restrictions: Impaired Restrictions Weight Bearing Restrictions Per Provider Order: Yes RLE Weight Bearing Per Provider Order: Weight bearing as tolerated     Mobility  Bed Mobility Overal bed mobility: Needs Assistance Bed Mobility: Supine to Sit     Supine to sit: Mod assist     General bed mobility comments: Min A with RLE and Mod A with assist getting trunk to midline. Improved speed this session    Transfers Overall transfer level: Needs assistance Equipment used: None Transfers: Bed to chair/wheelchair/BSC       Squat pivot transfers: Mod assist, Max assist     General transfer comment: Max A transfer from EOB to  [facility] and from [facility] to recliner at Mod A with multi modal cues for sequencing.    Ambulation/Gait     General Gait Details: unable at this time.     Balance Overall balance assessment: Needs assistance Sitting-balance support: Single extremity supported, Feet supported Sitting balance-Leahy Scale: Fair   Postural control: Posterior lean Standing balance support: Bilateral upper extremity supported, During functional activity Standing balance-Leahy Scale: Poor Standing balance comment: Mod A for balance.      Communication Communication Communication: No apparent difficulties  Cognition Arousal: Alert Behavior During Therapy: WFL for tasks assessed/performed   PT - Cognitive impairments: No apparent impairments     Following commands: Intact      Cueing Cueing Techniques: Verbal cues     General Comments General comments (skin integrity, edema, etc.): Pt is very nervous about placing wgt through operative leg due to pain. Heavy UE support required. Dressing dry and intact. pt had large BM during session and RN was notified.      Pertinent Vitals/Pain Pain Assessment Pain Assessment: Faces Faces Pain Scale: Hurts little more Pain Location: R hip Pain Descriptors / Indicators: Discomfort, Grimacing, Aching, Numbness Pain Intervention(s): Monitored during session, Limited activity within patient's tolerance     PT Goals (current goals can now be found in the care plan section) Acute Rehab PT Goals Patient Stated Goal: improve mobility and go home. PT Goal Formulation: With patient Time For Goal Achievement: 10/09/24 Potential to Achieve Goals: Good Progress towards PT goals: Progressing toward goals    Frequency    Min 3X/week  PT Plan  Continue with current POC        AM-PAC PT 6 Clicks Mobility   Outcome Measure  Help needed turning from your back to your side while in a flat bed without using bedrails?: A Lot Help needed moving from lying on  your back to sitting on the side of a flat bed without using bedrails?: A Lot Help needed moving to and from a bed to a chair (including a wheelchair)?: A Lot Help needed standing up from a chair using your arms (e.g., wheelchair or bedside chair)?: A Lot Help needed to walk in hospital room?: Total Help needed climbing 3-5 steps with a railing? : Total 6 Click Score: 10    End of Session Equipment Utilized During Treatment: Gait belt Activity Tolerance: Patient limited by pain Patient left: in chair;with call bell/phone within reach;with chair alarm set;with family/visitor present Nurse Communication: Mobility status;Need for lift equipment PT Visit Diagnosis: Unsteadiness on feet (R26.81);Other abnormalities of gait and mobility (R26.89);Muscle weakness (generalized) (M62.81);Pain Pain - Right/Left: Right Pain - part of body: Hip     Time: 8952-8879 PT Time Calculation (min) (ACUTE ONLY): 33 min  Charges:    $Therapeutic Activity: 23-37 mins PT General Charges $$ ACUTE PT VISIT: 1 Visit                    Dorothyann Maier, DPT, CLT  Acute Rehabilitation Services Office: [phone] (Secure chat preferred)]    Dorothyann VEAR Maier 09/28/2024, 11:37 AM

## 2024-09-28 NOTE — Progress Notes (Signed)
 " Progress Note    Connor Bryant   FMW:[id number]  DOB: [birth year]  DOA: 09/23/2024     4 PCP: Renato Dorothey [name], NP  Initial CC: Fall at home  [facility] Course: Connor Bryant is a 79 yo male with PMH CAD s/p stenting, afib s/p Wachman (no longer on Eliquis ), hx GIB, HFpEF, DMII who presented after a mechanical fall.  He was attempting to step on the floor switch for his Christmas tree light and after turning off the lights he lost his footing when stepping backwards and fell while twisting onto his right side. He had crawled across the floor to his phone to call for help after the fall. Imaging was notable for right femoral neck fracture. He was ultimately transferred to [facility] from [facility] and underwent ORIF with cephalomedullary rod on 09/24/2024.  Interval History:  No events overnight.  Out of bed to the chair with physical therapy this morning. Still having ongoing right hip pain as expected. Awaiting discharge to rehab at this point.  Assessment and Plan: * Intertrochanteric fracture of right hip (HCC) - s/p mechanical fall at home - Sustained right femoral neck fracture after fall - Underwent ORIF with cephalomedullary rod on 09/24/2024 -Still having quite a bit of right hip pain as expected - continue with PT; rec'd for SNF; patient amenable  ABLA (acute blood loss anemia) - s/p surgery - soft RLE but swollen as expected - trend H/H as needed - plavix  resumed 1/3 - Hgb stable on 1/4  Coronary artery disease involving native coronary artery of native heart without angina pectoris -Continue Crestor - plavix  resumed 1/3  Atrial fibrillation (HCC) - s/p Watchman device - no longer on anticoagulation  Parkinsonism (HCC) - Continue Sinemet   Chronic kidney disease, stage 3b (HCC) - patient has history of CKD3b. Baseline creat ~ 1.5, eGFR~ 44   GERD (gastroesophageal reflux disease) - Continue PPI  HLD (hyperlipidemia) - Continue Zetia  and Crestor  Type 2 diabetes mellitus  without complication, without long-term current use of insulin  (HCC) - Continue SSI and CBG monitoring  Essential hypertension Continue losartan  and hydralazine    Antimicrobials:   DVT prophylaxis:  SCDs Start: 09/24/24 0146 Plavix    Code Status:   Code Status: Full Code  Mobility Assessment (Last 72 Hours)     Mobility Assessment     Row Name 09/28/24 1136 09/27/24 2230 09/27/24 1000 09/26/24 2030 09/26/24 0945   Does the patient have exclusion criteria? -- No- Perform mobility assessment No- Perform mobility assessment No- Perform mobility assessment No- Perform mobility assessment   What is the highest level of mobility based on the mobility assessment? Level 2 (Chairfast) - Balance while sitting on edge of bed and cannot stand Level 1 (Bedfast) - Unable to balance while sitting on edge of bed Level 1 (Bedfast) - Unable to balance while sitting on edge of bed Level 1 (Bedfast) - Unable to balance while sitting on edge of bed Level 1 (Bedfast) - Unable to balance while sitting on edge of bed   Is the above level different from baseline mobility prior to current illness? -- Yes - Recommend PT order Yes - Recommend PT order -- Yes - Recommend PT order    Row Name 09/25/24 2100 09/25/24 1601         Does the patient have exclusion criteria? No- Perform mobility assessment --      What is the highest level of mobility based on the mobility assessment? Level 1 (Bedfast) - Unable to  balance while sitting on edge of bed Level 2 (Chairfast) - Balance while sitting on edge of bed and cannot stand      Is the above level different from baseline mobility prior to current illness? Yes - Recommend PT order --         Diet: Diet Orders (From admission, onward)     Start     Ordered   09/24/24 2016  Diet Carb Modified  Diet effective now       Question Answer Comment  Calorie Level Medium 1600-2000   Fluid consistency: Thin      09/24/24 2015            Therapy evaluation: PT  Orders: Active   PT Follow up Rec: Skilled Nursing-Short Term Rehab (<3 Hours/Day)09/28/2024 1136  Barriers to discharge: None Disposition Plan: SNF Status is: Inpatient  Objective: Blood pressure (!) 120/55, pulse 94, temperature 97.9 F (36.6 C), temperature source Oral, resp. rate 18, height 5' 10 (1.778 m), weight 83.9 kg, SpO2 97%.  Examination:  Physical Exam Constitutional:      General: He is not in acute distress.    Appearance: Normal appearance.  HENT:     Head: Normocephalic and atraumatic.     Mouth/Throat:     Mouth: Mucous membranes are moist.  Eyes:     Extraocular Movements: Extraocular movements intact.  Cardiovascular:     Rate and Rhythm: Normal rate and regular rhythm.  Pulmonary:     Effort: Pulmonary effort is normal. No respiratory distress.     Breath sounds: Normal breath sounds. No wheezing.  Abdominal:     General: Bowel sounds are normal. There is no distension.     Palpations: Abdomen is soft.     Tenderness: There is no abdominal tenderness.  Musculoskeletal:        General: Normal range of motion.     Cervical back: Normal range of motion and neck supple.     Right lower leg: Edema (Generalized edema along right hip down right thigh, compartments soft.  No obvious bruising noted.  Surgical dressings in place) present.  Skin:    General: Skin is warm and dry.  Neurological:     General: No focal deficit present.     Mental Status: He is alert.  Psychiatric:        Mood and Affect: Mood normal.        Behavior: Behavior normal.      Consultants:  Orthopedic surgery  Procedures:  Right peritrochanteric femur fracture ORIF with cephalomedullary rod: 09/24/2024  Data Reviewed: Results for orders placed or performed during the hospital encounter of 09/23/24 (from the past 24 hours)  Glucose, capillary     Status: Abnormal   Collection Time: 09/27/24  4:52 PM  Result Value Ref Range   Glucose-Capillary 145 (H) 70 - 99 mg/dL  Glucose,  capillary     Status: Abnormal   Collection Time: 09/27/24 10:23 PM  Result Value Ref Range   Glucose-Capillary 159 (H) 70 - 99 mg/dL  Glucose, capillary     Status: Abnormal   Collection Time: 09/28/24  8:17 AM  Result Value Ref Range   Glucose-Capillary 153 (H) 70 - 99 mg/dL  Glucose, capillary     Status: Abnormal   Collection Time: 09/28/24 12:15 PM  Result Value Ref Range   Glucose-Capillary 153 (H) 70 - 99 mg/dL    I have reviewed pertinent nursing notes, vitals, labs, and images as necessary. I have ordered  labwork to follow up on as indicated.  I have reviewed the last notes from staff over past 24 hours. I have discussed patient's care plan and test results with nursing staff, CM/SW, and other staff as appropriate.  Old records reviewed in assessment of this patient    LOS: 4 days   Alm Apo, MD [facility] 09/28/2024, 1:25 PM "

## 2024-09-28 NOTE — TOC CAGE-AID Note (Addendum)
 Transition of Care [facility]) - CAGE-AID Screening   Patient Details  Name: Connor Bryant MRN: [id number] Date of Birth: September 18, 1946  Transition of Care [facility]) CM/SW Contact:    Ermal Brzozowski E Tandy Grawe, LCSW Phone Number: 09/28/2024, 9:21 AM   Clinical Narrative: Patient states he drinks alcohol - about 1 beer per day, denies other substance use or resource needs.   CAGE-AID Screening:    Have You Ever Felt You Ought to Cut Down on Your Drinking or Drug Use?: No Have People Annoyed You By Critizing Your Drinking Or Drug Use?: No Have You Felt Bad Or Guilty About Your Drinking Or Drug Use?: No Have You Ever Had a Drink or Used Drugs First Thing In The Morning to Steady Your Nerves or to Get Rid of a Hangover?: No CAGE-AID Score: 0

## 2024-09-28 NOTE — Plan of Care (Signed)
" °  Problem: Fluid Volume: Goal: Ability to maintain a balanced intake and output will improve Outcome: Progressing   Problem: Health Behavior/Discharge Planning: Goal: Ability to identify and utilize available resources and services will improve Outcome: Progressing Goal: Ability to manage health-related needs will improve Outcome: Progressing   Problem: Metabolic: Goal: Ability to maintain appropriate glucose levels will improve Outcome: Progressing   Problem: Nutritional: Goal: Maintenance of adequate nutrition will improve Outcome: Progressing Goal: Progress toward achieving an optimal weight will improve Outcome: Progressing   Problem: Skin Integrity: Goal: Risk for impaired skin integrity will decrease Outcome: Progressing   Problem: Tissue Perfusion: Goal: Adequacy of tissue perfusion will improve Outcome: Progressing   Problem: Coping: Goal: Ability to adjust to condition or change in health will improve Outcome: Not Progressing   "

## 2024-09-29 DIAGNOSIS — S72144A Nondisplaced intertrochanteric fracture of right femur, initial encounter for closed fracture: Secondary | ICD-10-CM | POA: Diagnosis not present

## 2024-09-29 DIAGNOSIS — D62 Acute posthemorrhagic anemia: Secondary | ICD-10-CM | POA: Diagnosis not present

## 2024-09-29 LAB — CBC WITH DIFFERENTIAL/PLATELET
Abs Immature Granulocytes: 0.04 K/uL (ref 0.00–0.07)
Basophils Absolute: 0 K/uL (ref 0.0–0.1)
Basophils Relative: 0 %
Eosinophils Absolute: 0.3 K/uL (ref 0.0–0.5)
Eosinophils Relative: 4 %
HCT: 25.3 % — ABNORMAL LOW (ref 39.0–52.0)
Hemoglobin: 8.3 g/dL — ABNORMAL LOW (ref 13.0–17.0)
Immature Granulocytes: 1 %
Lymphocytes Relative: 16 %
Lymphs Abs: 1.3 K/uL (ref 0.7–4.0)
MCH: 30 pg (ref 26.0–34.0)
MCHC: 32.8 g/dL (ref 30.0–36.0)
MCV: 91.3 fL (ref 80.0–100.0)
Monocytes Absolute: 0.7 K/uL (ref 0.1–1.0)
Monocytes Relative: 8 %
Neutro Abs: 5.6 K/uL (ref 1.7–7.7)
Neutrophils Relative %: 71 %
Platelets: 244 K/uL (ref 150–400)
RBC: 2.77 MIL/uL — ABNORMAL LOW (ref 4.22–5.81)
RDW: 14.3 % (ref 11.5–15.5)
WBC: 7.9 K/uL (ref 4.0–10.5)
nRBC: 0 % (ref 0.0–0.2)

## 2024-09-29 LAB — GLUCOSE, CAPILLARY
Glucose-Capillary: 150 mg/dL — ABNORMAL HIGH (ref 70–99)
Glucose-Capillary: 193 mg/dL — ABNORMAL HIGH (ref 70–99)
Glucose-Capillary: 173 mg/dL — ABNORMAL HIGH (ref 70–99)
Glucose-Capillary: 131 mg/dL — ABNORMAL HIGH (ref 70–99)

## 2024-09-29 LAB — BASIC METABOLIC PANEL WITH GFR
Anion gap: 10 (ref 5–15)
BUN: 33 mg/dL — ABNORMAL HIGH (ref 8–23)
CO2: 26 mmol/L (ref 22–32)
Calcium: 9.1 mg/dL (ref 8.9–10.3)
Chloride: 100 mmol/L (ref 98–111)
Creatinine, Ser: 1.29 mg/dL — ABNORMAL HIGH (ref 0.61–1.24)
GFR, Estimated: 56 mL/min — ABNORMAL LOW
Glucose, Bld: 158 mg/dL — ABNORMAL HIGH (ref 70–99)
Potassium: 4.2 mmol/L (ref 3.5–5.1)
Sodium: 136 mmol/L (ref 135–145)

## 2024-09-29 LAB — TROPONIN T, HIGH SENSITIVITY: Troponin T High Sensitivity: 135 ng/L (ref 0–19)

## 2024-09-29 LAB — MAGNESIUM: Magnesium: 2.1 mg/dL (ref 1.7–2.4)

## 2024-09-29 MED ORDER — ALUM & MAG HYDROXIDE-SIMETH 200-200-20 MG/5ML PO SUSP
30.0000 mL | Freq: Four times a day (QID) | ORAL | Status: DC | PRN
  Administered 2024-09-29: 30 mL via ORAL
  Filled 2024-09-29: qty 30

## 2024-09-29 MED ORDER — NITROGLYCERIN 0.4 MG SL SUBL
0.4000 mg | SUBLINGUAL_TABLET | SUBLINGUAL | Status: DC | PRN
  Administered 2024-09-29: 0.4 mg via SUBLINGUAL
  Filled 2024-09-29: qty 1

## 2024-09-29 MED ORDER — MORPHINE SULFATE (PF) 2 MG/ML IV SOLN
1.0000 mg | Freq: Once | INTRAVENOUS | Status: AC
  Administered 2024-09-29: 1 mg via INTRAVENOUS
  Filled 2024-09-29: qty 1

## 2024-09-29 NOTE — Progress Notes (Signed)
 Physical Therapy Treatment Patient Details Name: Connor Bryant MRN: [id number] DOB: 1946/02/17 Today's Date: 09/29/2024   History of Present Illness Pt is 79 yo presenting to [facility] on 1/[facility] due to fall. Currently pt is s/p ORIF with cephalomedullary rod on 09/24/2024. PMH: CAD s/p stenting, afib s/p watchman, GIB, HFpEF, DM II.    PT Comments  Pt received in supine and agreeable to session. Pt continues to be limited by RLE pain during mobility and WB. Pt demonstrates improved ability to clear RLE to step to recliner, but has difficulty advancing LLE. Pt with quick BUE fatigue from offloading RLE requiring increased assist and time to reach recliner. Pt continues to benefit from PT services to progress toward functional mobility goals.    If plan is discharge home, recommend the following: Assist for transportation;Assistance with cooking/housework   Can travel by private vehicle     No  Equipment Recommendations  Wheelchair cushion (measurements PT);Wheelchair (measurements PT);Hospital bed    Recommendations for Other Services       Precautions / Restrictions Precautions Precautions: Fall Recall of Precautions/Restrictions: Impaired Restrictions Weight Bearing Restrictions Per Provider Order: Yes RLE Weight Bearing Per Provider Order: Weight bearing as tolerated     Mobility  Bed Mobility Overal bed mobility: Needs Assistance Bed Mobility: Supine to Sit     Supine to sit: Mod assist     General bed mobility comments: Min A with RLE and Mod A with HHA for trunk elevation. Pt able to scoot forward to EOB    Transfers Overall transfer level: Needs assistance Equipment used: Rolling walker (2 wheels) Transfers: Sit to/from Stand, Bed to chair/wheelchair/BSC Sit to Stand: Mod assist   Step pivot transfers: Mod assist, Max assist       General transfer comment: STS from elevated EOB with mod A for power up and cues for hand placement. pivot to recliner with improved R foot  clearance, but difficulty advancing LLE due to decreased RLE WB tolerance. Pt demonstrates quick BUE fatigue and at end of pivot, pt leans forearms on RW for support    Ambulation/Gait               General Gait Details: unable at this time.   Stairs             Wheelchair Mobility     Tilt Bed    Modified Rankin (Stroke Patients Only)       Balance Overall balance assessment: Needs assistance Sitting-balance support: Bilateral upper extremity supported, Feet supported Sitting balance-Leahy Scale: Fair Sitting balance - Comments: sitting EOB   Standing balance support: Bilateral upper extremity supported, During functional activity, Reliant on assistive device for balance Standing balance-Leahy Scale: Poor Standing balance comment: reliant on RW support                            Communication Communication Communication: No apparent difficulties  Cognition Arousal: Alert Behavior During Therapy: WFL for tasks assessed/performed   PT - Cognitive impairments: No apparent impairments                         Following commands: Intact      Cueing Cueing Techniques: Verbal cues  Exercises General Exercises - Lower Extremity Long Arc Quad: AROM, Seated, 10 reps, Right    General Comments        Pertinent Vitals/Pain Pain Assessment Pain Assessment: Faces Faces Pain Scale: Hurts even  more Pain Location: R hip Pain Descriptors / Indicators: Discomfort, Grimacing, Aching, Numbness Pain Intervention(s): Limited activity within patient's tolerance, Monitored during session, Premedicated before session, Repositioned     PT Goals (current goals can now be found in the care plan section) Acute Rehab PT Goals Patient Stated Goal: improve mobility and go home. PT Goal Formulation: With patient Time For Goal Achievement: 10/09/24 Progress towards PT goals: Progressing toward goals    Frequency    Min 3X/week       AM-PAC  PT 6 Clicks Mobility   Outcome Measure  Help needed turning from your back to your side while in a flat bed without using bedrails?: A Lot Help needed moving from lying on your back to sitting on the side of a flat bed without using bedrails?: A Lot Help needed moving to and from a bed to a chair (including a wheelchair)?: A Lot Help needed standing up from a chair using your arms (e.g., wheelchair or bedside chair)?: A Lot Help needed to walk in hospital room?: Total Help needed climbing 3-5 steps with a railing? : Total 6 Click Score: 10    End of Session Equipment Utilized During Treatment: Gait belt Activity Tolerance: Patient limited by pain;Patient tolerated treatment well Patient left: in chair;with call bell/phone within reach;with chair alarm set Nurse Communication: Mobility status;Need for lift equipment PT Visit Diagnosis: Unsteadiness on feet (R26.81);Other abnormalities of gait and mobility (R26.89);Muscle weakness (generalized) (M62.81);Pain Pain - Right/Left: Right Pain - part of body: Hip     Time: 8565-8546 PT Time Calculation (min) (ACUTE ONLY): 19 min  Charges:    $Therapeutic Activity: 8-22 mins PT General Charges $$ ACUTE PT VISIT: 1 Visit                    Darryle George, PTA Acute Rehabilitation Services Secure Chat Preferred  [phone]    Darryle George 09/29/2024, 3:25 PM

## 2024-09-29 NOTE — TOC Progression Note (Signed)
 Transition of Care [facility]) - Progression Note    Patient Details  Name: Connor Bryant MRN: [id number] Date of Birth: 04-Mar-1946  Transition of Care [facility]) CM/SW Contact  Sherline Clack, [place] Phone Number: 09/29/2024, 3:18 PM  Clinical Narrative:     CSW reviewed bed offers with patient at bedside. Patient was interested in [facility] Nursing but was unfortunately not offered a bed at this time. Patient asked CSW for time to think about which facility would be better distance-wise. CSW told patient she would return tomorrow to get his choice and start insurance authorization. CSW explained insurance process and patient expressed understanding. CSW will continue to follow and update DC plan.   Expected Discharge Plan: Skilled Nursing Facility Barriers to Discharge: Continued Medical Work up, English As A Second Language Teacher               Expected Discharge Plan and Services     Post Acute Care Choice: Skilled Nursing Facility Living arrangements for the past 2 months: Single Family Home                                       Social Drivers of Health (SDOH) Interventions SDOH Screenings   Food Insecurity: No Food Insecurity (09/24/2024)  Housing: Low Risk (09/24/2024)  Transportation Needs: No Transportation Needs (09/24/2024)  Utilities: Not At Risk (09/24/2024)  Alcohol Screen: Low Risk (12/04/2023)  Depression (PHQ2-9): Low Risk (05/28/2024)  Financial Resource Strain: Low Risk (12/09/2023)  Physical Activity: Inactive (12/04/2023)  Social Connections: Moderately Isolated (09/24/2024)  Stress: No Stress Concern Present (12/09/2023)  Tobacco Use: Medium Risk (09/24/2024)  Health Literacy: Adequate Health Literacy (12/09/2023)    Readmission Risk Interventions    12/02/2023   10:06 AM 11/29/2023   10:09 AM 11/27/2023    8:43 AM  Readmission Risk Prevention Plan  Transportation Screening Complete Complete Complete  HRI or Home Care Consult  Complete Complete  Social Work Consult  for Recovery Care Planning/Counseling  Complete Complete  Palliative Care Screening  Not Applicable Not Applicable  Medication Review Oceanographer) Complete Complete Complete  HRI or Home Care Consult Complete    SW Recovery Care/Counseling Consult Complete    Palliative Care Screening Not Applicable    Skilled Nursing Facility Not Applicable

## 2024-09-29 NOTE — Progress Notes (Signed)
 " Progress Note    Connor Bryant   FMW:[id number]  DOB: 09-12-1946  DOA: 09/23/2024     5 PCP: Renato Dorothey [name], NP  Initial CC: Fall at home  [facility] Course: Mr. Rames is a 79 yo male with PMH CAD s/p stenting, afib s/p Wachman (no longer on Eliquis ), hx GIB, HFpEF, DMII who presented after a mechanical fall.  He was attempting to step on the floor switch for his Christmas tree light and after turning off the lights he lost his footing when stepping backwards and fell while twisting onto his right side. He had crawled across the floor to his phone to call for help after the fall. Imaging was notable for right femoral neck fracture. He was ultimately transferred to [facility] from [facility] and underwent ORIF with cephalomedullary rod on 09/24/2024.  Interval History:  No events overnight.  Remains medically stable and awaiting rehab bed.  Assessment and Plan: * Intertrochanteric fracture of right hip (HCC) - s/p mechanical fall at home - Sustained right femoral neck fracture after fall - Underwent ORIF with cephalomedullary rod on 09/24/2024 -Still having quite a bit of right hip pain as expected - continue with PT; rec'd for SNF; patient amenable  ABLA (acute blood loss anemia) - s/p surgery - soft RLE but swollen as expected - trend H/H as needed - plavix  resumed 1/3 - Hgb stable  Coronary artery disease involving native coronary artery of native heart without angina pectoris -Continue Crestor - plavix  resumed 1/3  Atrial fibrillation (HCC) - s/p Watchman device - no longer on anticoagulation  Parkinsonism (HCC) - Continue Sinemet   Chronic kidney disease, stage 3b (HCC) - patient has history of CKD3b. Baseline creat ~ 1.5, eGFR~ 44   GERD (gastroesophageal reflux disease) - Continue PPI  HLD (hyperlipidemia) - Continue Zetia  and Crestor  Type 2 diabetes mellitus without complication, without long-term current use of insulin  (HCC) - Continue SSI and CBG  monitoring  Essential hypertension Continue losartan  and hydralazine    Antimicrobials:   DVT prophylaxis:  SCDs Start: 09/24/24 0146 Plavix    Code Status:   Code Status: Full Code  Mobility Assessment (Last 72 Hours)     Mobility Assessment     Row Name 09/29/24 1000 09/29/24 0300 09/28/24 2100 09/28/24 1136 09/28/24 0930   Does the patient have exclusion criteria? No- Perform mobility assessment No- Perform mobility assessment No- Perform mobility assessment -- No- Perform mobility assessment   What is the highest level of mobility based on the mobility assessment? Level 1 (Bedfast) - Unable to balance while sitting on edge of bed Level 1 (Bedfast) - Unable to balance while sitting on edge of bed Level 1 (Bedfast) - Unable to balance while sitting on edge of bed Level 2 (Chairfast) - Balance while sitting on edge of bed and cannot stand Level 2 (Chairfast) - Balance while sitting on edge of bed and cannot stand   Is the above level different from baseline mobility prior to current illness? Yes - Recommend PT order -- Yes - Recommend PT order -- Yes - Recommend PT order    Row Name 09/27/24 2230 09/27/24 1000 09/26/24 2030       Does the patient have exclusion criteria? No- Perform mobility assessment No- Perform mobility assessment No- Perform mobility assessment     What is the highest level of mobility based on the mobility assessment? Level 1 (Bedfast) - Unable to balance while sitting on edge of bed Level 1 (Bedfast) - Unable to balance  while sitting on edge of bed Level 1 (Bedfast) - Unable to balance while sitting on edge of bed     Is the above level different from baseline mobility prior to current illness? Yes - Recommend PT order Yes - Recommend PT order --        Diet: Diet Orders (From admission, onward)     Start     Ordered   09/24/24 2016  Diet Carb Modified  Diet effective now       Question Answer Comment  Calorie Level Medium 1600-2000   Fluid consistency:  Thin      09/24/24 2015            Therapy evaluation: PT Orders: Active   PT Follow up Rec: Skilled Nursing-Short Term Rehab (<3 Hours/Day)09/28/2024 1136  Barriers to discharge: None Disposition Plan: SNF Status is: Inpatient  Objective: Blood pressure (!) 142/60, pulse 87, temperature (!) 97.4 F (36.3 C), temperature source Oral, resp. rate 18, height 5' 10 (1.778 m), weight 83.9 kg, SpO2 98%.  Examination:  Physical Exam Constitutional:      General: He is not in acute distress.    Appearance: Normal appearance.  HENT:     Head: Normocephalic and atraumatic.     Mouth/Throat:     Mouth: Mucous membranes are moist.  Eyes:     Extraocular Movements: Extraocular movements intact.  Cardiovascular:     Rate and Rhythm: Normal rate and regular rhythm.  Pulmonary:     Effort: Pulmonary effort is normal. No respiratory distress.     Breath sounds: Normal breath sounds. No wheezing.  Abdominal:     General: Bowel sounds are normal. There is no distension.     Palpations: Abdomen is soft.     Tenderness: There is no abdominal tenderness.  Musculoskeletal:        General: Normal range of motion.     Cervical back: Normal range of motion and neck supple.     Right lower leg: Edema (Generalized edema along right hip down right thigh, compartments soft.  No obvious bruising noted.  Surgical dressings in place) present.  Skin:    General: Skin is warm and dry.  Neurological:     General: No focal deficit present.     Mental Status: He is alert.  Psychiatric:        Mood and Affect: Mood normal.        Behavior: Behavior normal.      Consultants:  Orthopedic surgery  Procedures:  Right peritrochanteric femur fracture ORIF with cephalomedullary rod: 09/24/2024  Data Reviewed: Results for orders placed or performed during the hospital encounter of 09/23/24 (from the past 24 hours)  Glucose, capillary     Status: Abnormal   Collection Time: 09/28/24 12:15 PM  Result  Value Ref Range   Glucose-Capillary 153 (H) 70 - 99 mg/dL  Glucose, capillary     Status: Abnormal   Collection Time: 09/28/24  3:51 PM  Result Value Ref Range   Glucose-Capillary 171 (H) 70 - 99 mg/dL  Glucose, capillary     Status: Abnormal   Collection Time: 09/28/24  9:11 PM  Result Value Ref Range   Glucose-Capillary 139 (H) 70 - 99 mg/dL   Comment 1 Notify RN    Comment 2 Document in Chart   CBC with Differential/Platelet     Status: Abnormal   Collection Time: 09/29/24  4:46 AM  Result Value Ref Range   WBC 7.9 4.0 - 10.5 K/uL  RBC 2.77 (L) 4.22 - 5.81 MIL/uL   Hemoglobin 8.3 (L) 13.0 - 17.0 g/dL   HCT 74.6 (L) 60.9 - 47.9 %   MCV 91.3 80.0 - 100.0 fL   MCH 30.0 26.0 - 34.0 pg   MCHC 32.8 30.0 - 36.0 g/dL   RDW 85.6 88.4 - 84.4 %   Platelets 244 150 - 400 K/uL   nRBC 0.0 0.0 - 0.2 %   Neutrophils Relative % 71 %   Neutro Abs 5.6 1.7 - 7.7 K/uL   Lymphocytes Relative 16 %   Lymphs Abs 1.3 0.7 - 4.0 K/uL   Monocytes Relative 8 %   Monocytes Absolute 0.7 0.1 - 1.0 K/uL   Eosinophils Relative 4 %   Eosinophils Absolute 0.3 0.0 - 0.5 K/uL   Basophils Relative 0 %   Basophils Absolute 0.0 0.0 - 0.1 K/uL   Immature Granulocytes 1 %   Abs Immature Granulocytes 0.04 0.00 - 0.07 K/uL  Basic metabolic panel with GFR     Status: Abnormal   Collection Time: 09/29/24  4:46 AM  Result Value Ref Range   Sodium 136 135 - 145 mmol/L   Potassium 4.2 3.5 - 5.1 mmol/L   Chloride 100 98 - 111 mmol/L   CO2 26 22 - 32 mmol/L   Glucose, Bld 158 (H) 70 - 99 mg/dL   BUN 33 (H) 8 - 23 mg/dL   Creatinine, Ser 8.70 (H) 0.61 - 1.24 mg/dL   Calcium  9.1 8.9 - 10.3 mg/dL   GFR, Estimated 56 (L) >60 mL/min   Anion gap 10 5 - 15  Magnesium      Status: None   Collection Time: 09/29/24  4:46 AM  Result Value Ref Range   Magnesium  2.1 1.7 - 2.4 mg/dL  Glucose, capillary     Status: Abnormal   Collection Time: 09/29/24  7:50 AM  Result Value Ref Range   Glucose-Capillary 150 (H) 70 - 99  mg/dL  Glucose, capillary     Status: Abnormal   Collection Time: 09/29/24 11:25 AM  Result Value Ref Range   Glucose-Capillary 193 (H) 70 - 99 mg/dL    I have reviewed pertinent nursing notes, vitals, labs, and images as necessary. I have ordered labwork to follow up on as indicated.  I have reviewed the last notes from staff over past 24 hours. I have discussed patient's care plan and test results with nursing staff, CM/SW, and other staff as appropriate.  Old records reviewed in assessment of this patient    LOS: 5 days   Alm Apo, MD [facility] 09/29/2024, 12:03 PM "

## 2024-09-29 NOTE — Plan of Care (Deleted)
 79 year old man  history of CAD on Crestor, Plavix .  History of atrial fibrillation on Watchman device not on anticoagulation.  Patient is being admitted for intertrochanteric fracture of the right hip status post ORIF 09/24/2024.  Initially Plavix  on hold for 3 days and resumed on 1/3.   Patient complaining about chest pain around 9 PM.  Hemodynamically stable. Obtaining EKG and checking troponin.  Update, EKG reassuring it is showing normal sinus rhythm heart rate 93 and left ventricular hypertrophy pattern. Patient has multiple comorbidities including history of CAD status post stenting, A-fib on Watchman device and heart failure with preserved ejection fraction.  Giving patient morphine , Maalox and sublingual nitroglycerin . Will follow-up with troponin level.   Kaelob Persky, MD [facility] 09/29/2024, 9:17 PM     -Update, initial troponin 135.  -After receiving morphine  sublingual nitroglycerin  and chest pain completely subsided.  -Will follow-up with second troponin level if it is trending up in that case  will start IV heparin  drip. -Obtaining echocardiogram to assess for any wall motion abnormality. -Discussed with on-call cardiology Dr. Gail will review the patient chart and will inform him again if troponin continues to trend.  Update, flat troponin 135 and 138.  Continue to cycle troponin.   Genevra Orne, MD [facility] 09/29/2024, 11:14 PM       Pleuritic chest pain Elevated troponin secondary to demand ischemia-from elevated blood pressure versus recent hip fracture Now patient is stating that he developed chest pain when he cough.  Concern for pleuritic nature chest pain however given history of CAD and A-fib continue to cycle troponin and need to follow-up with echocardiogram. -As patient was off Plavix  for 3 days postsurgery and currently not on any anticoagulation/pharmacologic DVT prophylaxis at high risk for bleeding from recent ORIF obtaining CTA  chest to rule out PE. Discussed with orthopedic surgeon Dr. Genelle and he said that if requires  can start heparin  drip safely and can continue the Plavix  as well.   Wilkie Zenon, MD [facility] 09/30/2024, 2:55 AM

## 2024-09-29 NOTE — Plan of Care (Signed)
 Patient calm and cooperative A&O X4. Dressings on right leg clean dry and intact. PRN pain medications given to treat pain. Patient left with call bell in reach and bed in lowest position.  Problem: Education: Goal: Ability to describe self-care measures that may prevent or decrease complications (Diabetes Survival Skills Education) will improve Outcome: Progressing   Problem: Coping: Goal: Ability to adjust to condition or change in health will improve Outcome: Progressing   Problem: Nutritional: Goal: Progress toward achieving an optimal weight will improve Outcome: Progressing   Problem: Education: Goal: Knowledge of General Education information will improve Description: Including pain rating scale, medication(s)/side effects and non-pharmacologic comfort measures Outcome: Progressing   Problem: Clinical Measurements: Goal: Ability to maintain clinical measurements within normal limits will improve Outcome: Progressing Goal: Diagnostic test results will improve Outcome: Progressing   Problem: Activity: Goal: Risk for activity intolerance will decrease Outcome: Progressing   Problem: Coping: Goal: Level of anxiety will decrease Outcome: Progressing   Problem: Elimination: Goal: Will not experience complications related to urinary retention Outcome: Progressing   Problem: Pain Managment: Goal: General experience of comfort will improve and/or be controlled Outcome: Progressing

## 2024-09-30 ENCOUNTER — Other Ambulatory Visit (HOSPITAL_COMMUNITY): Payer: Self-pay

## 2024-09-30 ENCOUNTER — Inpatient Hospital Stay (HOSPITAL_COMMUNITY)

## 2024-09-30 ENCOUNTER — Telehealth (HOSPITAL_COMMUNITY): Payer: Self-pay

## 2024-09-30 DIAGNOSIS — M7989 Other specified soft tissue disorders: Secondary | ICD-10-CM

## 2024-09-30 DIAGNOSIS — S72141A Displaced intertrochanteric fracture of right femur, initial encounter for closed fracture: Secondary | ICD-10-CM | POA: Diagnosis not present

## 2024-09-30 DIAGNOSIS — R079 Chest pain, unspecified: Secondary | ICD-10-CM

## 2024-09-30 DIAGNOSIS — I2699 Other pulmonary embolism without acute cor pulmonale: Secondary | ICD-10-CM | POA: Diagnosis not present

## 2024-09-30 LAB — ECHOCARDIOGRAM COMPLETE
AR max vel: 0.94 cm2
AV Area VTI: 0.88 cm2
AV Area mean vel: 0.86 cm2
AV Mean grad: 13 mmHg
AV Peak grad: 21.7 mmHg
Ao pk vel: 2.33 m/s
Area-P 1/2: 3.91 cm2
Height: 70 in
MV M vel: 3.14 m/s
MV Peak grad: 39.4 mmHg
S' Lateral: 3.4 cm
Weight: 2960 [oz_av]

## 2024-09-30 LAB — CBC
HCT: 23.9 % — ABNORMAL LOW (ref 39.0–52.0)
HCT: 27 % — ABNORMAL LOW (ref 39.0–52.0)
Hemoglobin: 7.9 g/dL — ABNORMAL LOW (ref 13.0–17.0)
Hemoglobin: 8.8 g/dL — ABNORMAL LOW (ref 13.0–17.0)
MCH: 29.4 pg (ref 26.0–34.0)
MCH: 29.7 pg (ref 26.0–34.0)
MCHC: 32.6 g/dL (ref 30.0–36.0)
MCHC: 33.1 g/dL (ref 30.0–36.0)
MCV: 89.8 fL (ref 80.0–100.0)
MCV: 90.3 fL (ref 80.0–100.0)
Platelets: 275 K/uL (ref 150–400)
Platelets: 281 K/uL (ref 150–400)
RBC: 2.66 MIL/uL — ABNORMAL LOW (ref 4.22–5.81)
RBC: 2.99 MIL/uL — ABNORMAL LOW (ref 4.22–5.81)
RDW: 14.4 % (ref 11.5–15.5)
RDW: 14.4 % (ref 11.5–15.5)
WBC: 10 K/uL (ref 4.0–10.5)
WBC: 8 K/uL (ref 4.0–10.5)
nRBC: 0 % (ref 0.0–0.2)
nRBC: 0 % (ref 0.0–0.2)

## 2024-09-30 LAB — BASIC METABOLIC PANEL WITH GFR
Anion gap: 10 (ref 5–15)
Anion gap: 11 (ref 5–15)
BUN: 32 mg/dL — ABNORMAL HIGH (ref 8–23)
BUN: 34 mg/dL — ABNORMAL HIGH (ref 8–23)
CO2: 26 mmol/L (ref 22–32)
CO2: 27 mmol/L (ref 22–32)
Calcium: 8.9 mg/dL (ref 8.9–10.3)
Calcium: 9.1 mg/dL (ref 8.9–10.3)
Chloride: 98 mmol/L (ref 98–111)
Chloride: 98 mmol/L (ref 98–111)
Creatinine, Ser: 1.29 mg/dL — ABNORMAL HIGH (ref 0.61–1.24)
Creatinine, Ser: 1.34 mg/dL — ABNORMAL HIGH (ref 0.61–1.24)
GFR, Estimated: 54 mL/min — ABNORMAL LOW
GFR, Estimated: 56 mL/min — ABNORMAL LOW
Glucose, Bld: 147 mg/dL — ABNORMAL HIGH (ref 70–99)
Glucose, Bld: 158 mg/dL — ABNORMAL HIGH (ref 70–99)
Potassium: 4.2 mmol/L (ref 3.5–5.1)
Potassium: 4.3 mmol/L (ref 3.5–5.1)
Sodium: 135 mmol/L (ref 135–145)
Sodium: 136 mmol/L (ref 135–145)

## 2024-09-30 LAB — GLUCOSE, CAPILLARY
Glucose-Capillary: 152 mg/dL — ABNORMAL HIGH (ref 70–99)
Glucose-Capillary: 160 mg/dL — ABNORMAL HIGH (ref 70–99)
Glucose-Capillary: 138 mg/dL — ABNORMAL HIGH (ref 70–99)
Glucose-Capillary: 151 mg/dL — ABNORMAL HIGH (ref 70–99)

## 2024-09-30 LAB — HEPARIN LEVEL (UNFRACTIONATED): Heparin Unfractionated: 0.28 [IU]/mL — ABNORMAL LOW (ref 0.30–0.70)

## 2024-09-30 LAB — IRON AND TIBC
Iron: 17 ug/dL — ABNORMAL LOW (ref 45–182)
Saturation Ratios: 8 % — ABNORMAL LOW (ref 17.9–39.5)
TIBC: 214 ug/dL — ABNORMAL LOW (ref 250–450)
UIBC: 197 ug/dL

## 2024-09-30 LAB — RETICULOCYTES
Immature Retic Fract: 21.9 % — ABNORMAL HIGH (ref 2.3–15.9)
RBC.: 2.67 MIL/uL — ABNORMAL LOW (ref 4.22–5.81)
Retic Count, Absolute: 90.2 K/uL (ref 19.0–186.0)
Retic Ct Pct: 3.4 % — ABNORMAL HIGH (ref 0.4–3.1)

## 2024-09-30 LAB — VITAMIN B12: Vitamin B-12: 357 pg/mL (ref 180–914)

## 2024-09-30 LAB — FERRITIN: Ferritin: 299 ng/mL (ref 24–336)

## 2024-09-30 LAB — TROPONIN T, HIGH SENSITIVITY
Troponin T High Sensitivity: 138 ng/L (ref 0–19)
Troponin T High Sensitivity: 141 ng/L (ref 0–19)

## 2024-09-30 LAB — FOLATE: Folate: 8 ng/mL

## 2024-09-30 LAB — PRO BRAIN NATRIURETIC PEPTIDE: Pro Brain Natriuretic Peptide: 2373 pg/mL — ABNORMAL HIGH

## 2024-09-30 MED ORDER — BENZONATATE 100 MG PO CAPS
100.0000 mg | ORAL_CAPSULE | Freq: Three times a day (TID) | ORAL | Status: DC
  Administered 2024-09-30 – 2024-10-02 (×7): 100 mg via ORAL
  Filled 2024-09-30 (×7): qty 1

## 2024-09-30 MED ORDER — HEPARIN (PORCINE) 25000 UT/250ML-% IV SOLN
1500.0000 [IU]/h | INTRAVENOUS | Status: DC
  Administered 2024-09-30: 1350 [IU]/h via INTRAVENOUS
  Administered 2024-10-01: 1450 [IU]/h via INTRAVENOUS
  Administered 2024-10-01: 1500 [IU]/h via INTRAVENOUS
  Filled 2024-09-30 (×3): qty 250

## 2024-09-30 MED ORDER — SODIUM CHLORIDE 0.9 % IV SOLN
200.0000 mg | Freq: Once | INTRAVENOUS | Status: AC
  Administered 2024-09-30: 200 mg via INTRAVENOUS
  Filled 2024-09-30: qty 10

## 2024-09-30 MED ORDER — VITAMIN B-12 1000 MCG PO TABS
500.0000 ug | ORAL_TABLET | Freq: Every day | ORAL | Status: DC
  Administered 2024-09-30 – 2024-10-02 (×3): 500 ug via ORAL
  Filled 2024-09-30 (×3): qty 1

## 2024-09-30 MED ORDER — HEPARIN BOLUS VIA INFUSION
5000.0000 [IU] | Freq: Once | INTRAVENOUS | Status: AC
  Administered 2024-09-30: 5000 [IU] via INTRAVENOUS
  Filled 2024-09-30: qty 5000

## 2024-09-30 MED ORDER — IRON SUCROSE 200 MG IVPB - SIMPLE MED
200.0000 mg | Freq: Once | Status: DC
  Filled 2024-09-30: qty 110

## 2024-09-30 MED ORDER — IOHEXOL 350 MG/ML SOLN
75.0000 mL | Freq: Once | INTRAVENOUS | Status: AC | PRN
  Administered 2024-09-30: 75 mL via INTRAVENOUS

## 2024-09-30 NOTE — Progress Notes (Signed)
 Pharmacy Consult for Heparin  gtt  Indication: pulmonary embolus  Allergies[1]  Patient Measurements: Height: 5' 10 (177.8 cm) Weight: 83.9 kg (185 lb) IBW/kg (Calculated) : 73 HEPARIN  DW (KG): 83.9  Vital Signs: Temp: 98.5 F (36.9 C) (01/07 2046) BP: 127/59 (01/07 2046) Pulse Rate: 84 (01/07 2046)  Labs: Recent Labs    09/29/24 0446 09/29/24 2359 09/30/24 0808 09/30/24 2042  HGB 8.3* 8.8* 7.9*  --   HCT 25.3* 27.0* 23.9*  --   PLT 244 281 275  --   HEPARINUNFRC  --   --   --  0.28*  CREATININE 1.29* 1.34* 1.29*  --     Estimated Creatinine Clearance: 47.9 mL/min (A) (by C-G formula based on SCr of 1.29 mg/dL (H)).  Assessment: Connor Bryant a 79 y.o. male presented with hip fx, now with PE on CT angio s/p ORIF. Pharmacy has been consulted for heparin  dosing.   Anticoagulation PTA: Patient not on anticoagulation PTA.  Heparin  level slightly subtherapeutic at 0.28. No infusion issues or bleeding noted  Goal of Therapy:  Heparin  level 0.3-0.7 units/ml Monitor platelets by anticoagulation protocol: Yes   Plan:  Increase heparin  infusion to 1,450 units/hour  CBC, Heparin  level daily   Larraine Brazier, PharmD Clinical Pharmacist 09/30/2024  9:47 PM **Pharmacist phone directory can now be found on [email or web address] ([email or web address]).  Listed under [facility].         [1] No Known Allergies

## 2024-09-30 NOTE — Progress Notes (Addendum)
 " PROGRESS NOTE    Connor Bryant  FMW:[id number] DOB: Aug 08, 1946 DOA: 09/23/2024 PCP: Renato Dorothey [name], NP   Brief Narrative: 79 year old with past medical history significant for CAD status post stenting, A-fib status post Watchman no longer on Eliquis , history of GI bleed, heart failure preserved ejection fraction, diabetes type 2 who presents after a mechanical fall.  He was found to have right femoral neck fracture.  Ultimately transferred to [facility] from [facility] and underwent ORIF with cephalomedullary rod on 18/09/2024  Overnight patient developed chest pain, troponin came back elevated at 135, chest pain improved after nitroglycerin  and morphine .  Cardiology was consulted.  Subsequently CT angio chest was obtained and was positive for acute PE patient was started on IV heparin .   Assessment & Plan:   Principal Problem:   Intertrochanteric fracture of right hip (HCC) Active Problems:   ABLA (acute blood loss anemia)   Coronary artery disease involving native coronary artery of native heart without angina pectoris   Atrial fibrillation (HCC)   Essential hypertension   Type 2 diabetes mellitus without complication, without long-term current use of insulin  (HCC)   HLD (hyperlipidemia)   GERD (gastroesophageal reflux disease)   Chronic kidney disease, stage 3b (HCC)   Parkinsonism (HCC)   Acute pulmonary embolism (HCC)   1-Intertrochanteric fracture of the right hip: - Patient presented after a mechanical fall at home - Sustained right femoral neck fracture after the fall - Underwent ORIF with cephalomedullary rod on 09/24/2024 - He will need rehab for physical therapy rehabilitation  Acute PE: Chest pain -Patient developed chest pain overnight, troponin mildly elevated, EKG no significant changes.  Cardiology consulted -Sequently CT angio chest was obtained and was positive for a small PE -Follow ECHO and LE doppler.   ABLA Post surgery - Hemoglobin decreased  postsurgery.  Presents with a hemoglobin of 11.2, post sx hb decreased to 8 - Continue to monitor hemoglobin  Iron  deficiency anemia: Plan to give IV iron  today  CAD -Continue Crestor -Plavix  was resumed 1/3 - Cardiology now the patient was going to be on Eliquis  IV heparin  for new PE we can change Plavix  to aspirin   A-fib -Status post Watchman device, history of GI bleed.   Parkinsonism -Continue Sinemet   CKD stage IIIb: -  GERD: -Continue PPI         Estimated body mass index is 26.54 kg/m as calculated from the following:   Height as of this encounter: 5' 10 (1.778 m).   Weight as of this encounter: 83.9 kg.   DVT prophylaxis: heparin   Code Status: Full code Family Communication: care discussed with patient.  Disposition Plan:  Status is: Inpatient Remains inpatient appropriate because: now management of  PE    Consultants:  Cardiology Georgina Sharper, Ortho  Procedures:  Underwent ORIF with cephalomedullary rod on 09/24/2024   Antimicrobials:    Subjective: He is alert, report chest pain has improved.  Denies worsening dyspnea.  He was concern about anticoagulation, he had watchman procedure. Explain to him he is now on heparin  for Pulmonary embolism.    Objective: Vitals:   09/29/24 2100 09/30/24 0137 09/30/24 0641 09/30/24 0758  BP: (!) 158/79 (!) 124/56 (!) 118/51 (!) 152/119  Pulse: 93 87 84 78  Resp: 18   14  Temp:  99 F (37.2 C) 98.2 F (36.8 C)   TempSrc:      SpO2: 100% 97% 95% 99%  Weight:      Height:  Intake/Output Summary (Last 24 hours) at 09/30/2024 0759 Last data filed at 09/29/2024 2143 Gross per 24 hour  Intake --  Output 1250 ml  Net -1250 ml   Filed Weights   09/23/24 2340 09/24/24 0005  Weight: 86 kg 83.9 kg    Examination:  General exam: Appears calm and comfortable  Respiratory system: Clear to auscultation. Respiratory effort normal. Cardiovascular system: S1 & S2 heard, RRR.  Gastrointestinal  system: Abdomen is nondistended, soft and nontender. No organomegaly or masses felt. Normal bowel sounds heard. Central nervous system: Alert and oriented. No focal neurological deficits. Extremities: Symmetric 5 x 5 power.    Data Reviewed: I have personally reviewed following labs and imaging studies  CBC: Recent Labs  Lab 09/24/24 0024 09/24/24 0541 09/25/24 0117 09/26/24 1103 09/27/24 0538 09/29/24 0446 09/29/24 2359  WBC 11.4* 9.7 9.6  --  8.1 7.9 10.0  NEUTROABS 9.9* 8.2*  --   --  5.4 5.6  --   HGB 12.4* 11.2* 9.4* 8.0* 8.0* 8.3* 8.8*  HCT 37.9* 33.7* 28.7* 24.5* 23.9* 25.3* 27.0*  MCV 89.8 89.9 91.4  --  89.8 91.3 90.3  PLT 250 235 196  --  182 244 281   Basic Metabolic Panel: Recent Labs  Lab 09/24/24 0024 09/24/24 0541 09/25/24 0117 09/29/24 0446 09/29/24 2359  NA 138 139 136 136 136  K 3.7 4.1 4.5 4.2 4.3  CL 100 103 102 100 98  CO2 21* 26 24 26 26   GLUCOSE 182* 182* 181* 158* 158*  BUN 28* 29* 25* 33* 34*  CREATININE 1.59* 1.54* 1.36* 1.29* 1.34*  CALCIUM  9.2 8.9 8.8* 9.1 9.1  MG  --  1.9 1.9 2.1  --   PHOS  --  4.4 3.7  --   --    GFR: Estimated Creatinine Clearance: 46.2 mL/min (A) (by C-G formula based on SCr of 1.34 mg/dL (H)). Liver Function Tests: No results for input(s): AST, ALT, ALKPHOS, BILITOT, PROT, ALBUMIN  in the last 168 hours. No results for input(s): LIPASE, AMYLASE in the last 168 hours. No results for input(s): AMMONIA in the last 168 hours. Coagulation Profile: Recent Labs  Lab 09/24/24 0024  INR 1.0   Cardiac Enzymes: No results for input(s): CKTOTAL, CKMB, CKMBINDEX, TROPONINI in the last 168 hours. BNP (last 3 results) No results for input(s): PROBNP in the last 8760 hours. HbA1C: No results for input(s): HGBA1C in the last 72 hours. CBG: Recent Labs  Lab 09/28/24 2111 09/29/24 0750 09/29/24 1125 09/29/24 1621 09/29/24 2152  GLUCAP 139* 150* 193* 173* 131*   Lipid Profile: No  results for input(s): CHOL, HDL, LDLCALC, TRIG, CHOLHDL, LDLDIRECT in the last 72 hours. Thyroid  Function Tests: No results for input(s): TSH, T4TOTAL, FREET4, T3FREE, THYROIDAB in the last 72 hours. Anemia Panel: No results for input(s): VITAMINB12, FOLATE, FERRITIN, TIBC, IRON , RETICCTPCT in the last 72 hours. Sepsis Labs: No results for input(s): PROCALCITON, LATICACIDVEN in the last 168 hours.  No results found for this or any previous visit (from the past 240 hours).       Radiology Studies: CT Angio Chest Pulmonary Embolism (PE) W or WO Contrast Addendum Date: 09/30/2024 ADDENDUM: Positive PE finding was discussed by telephone with Floor Nurse Crystal at [phone] hours on 09/30/2024. ---------------------------------------------------- Electronically signed by: Helayne Hurst MD 09/30/2024 06:47 AM EST RP Workstation: [id number]   Result Date: 09/30/2024  EXAM: CTA of the Chest with contrast for PE 09/30/2024 06:12:49 AM TECHNIQUE: CTA of the chest was performed after the  administration of 75 mL of iohexol  (OMNIPAQUE ) 350 MG/ML intravenous contrast. Multiplanar reformatted images are provided for review. MIP images are provided for review. Automated exposure control, iterative reconstruction, and/or weight based adjustment of the mA/kV was utilized to reduce the radiation dose to as low as reasonably achievable. COMPARISON: CTA chest 05/08/2020. CLINICAL HISTORY: 79 year old male. Rule out pulmonary embolism. Status post right femur fracture and open reduction internal fixation. FINDINGS: PULMONARY ARTERIES: Pulmonary arteries are adequately opacified for evaluation. Right lower lobe segmental or subsegmental pulmonary artery thrombus is present in the posterior basal segment region (series 7, image 165). No other pulmonary artery filling defect identified. Main pulmonary artery is normal in caliber. MEDIASTINUM: The heart demonstrates a mitral valve prosthesis  and severe calcified coronary artery atherosclerosis. No pericardial effusion. There is no acute abnormality of the thoracic aorta. The aorta is heavily calcified and demonstrates chronic tortuosity of the distal descending thoracic aorta, which is stable. No contrast is seen within the aorta. LYMPH NODES: No mediastinal, hilar or axillary lymphadenopathy. LUNGS AND PLEURA: Lung volumes are similar to the 2021 CTA with mild atelectatic changes to the major airways. Asymmetric right lower lobe bronchial wall thickening is noted (series 6, image 83). There is streaky and confluent right lower lobe basilar segmental opacity which may be a combination of infarction and atelectasis. Contralateral dependent left lower lobe atelectasis is present. No other confluent lung opacity. Trace right pleural effusion. No pneumothorax. UPPER ABDOMEN: Limited images of the upper abdomen demonstrate punctate gallstones within the neck of the gallbladder, which otherwise appears grossly stable. SOFT TISSUES AND BONES: Osteopenia. Moderate chronic T12 compression fracture is stable since 2021. Mild T11 compression fracture is stable. Mild T9 compression fracture is stable. Mild T4 inferior endplate compression is new since 2021 but otherwise age indeterminate, with 15% loss of vertebral body height. No retropulsion. Chronic distal left clavicle fracture is partially visible. Otherwise, no acute osseous abnormality. IMPRESSION: 1. Positive for small right lower lobe segmental or subsegmental pulmonary artery embolus. Right lower lobe basilar opacity may represent a combination of infarct and atelectasis, with trace right pleural effusion. 2. Mild T4 inferior endplate compression fracture, new since 2021 but the age is indeterminate (15% loss of height). 3. Advanced aortic and coronary artery atherosclerosis. 4. Chronic cholelithiasis. Electronically signed by: Helayne Hurst MD 09/30/2024 06:26 AM EST RP Workstation: [id number]         Scheduled Meds:  acetaminophen   1,000 mg Oral Q8H   allopurinol   100 mg Oral BID   atorvastatin   80 mg Oral Daily   benzonatate   100 mg Oral TID   carbidopa -levodopa   1 tablet Oral TID   clopidogrel   75 mg Oral Daily   ezetimibe   10 mg Oral Daily   feeding supplement (GLUCERNA SHAKE)  237 mL Oral BID BM   hydrALAZINE   50 mg Oral TID   insulin  aspart  0-9 Units Subcutaneous TID AC & HS   losartan   50 mg Oral Daily   pantoprazole   40 mg Oral BID   Continuous Infusions:  heparin        LOS: 6 days    Time spent: 35 Minutes    Samiel Peel A Brodyn Depuy, MD [facility]   If 7PM-7AM, please contact night-coverage [email or web address]  09/30/2024, 7:59 AM   "

## 2024-09-30 NOTE — Consult Note (Addendum)
 "  Cardiology Consultation   Patient ID: Connor Bryant MRN: [id number]; DOB: Dec 03, 1945  Admit date: 09/23/2024 Date of Consult: 09/30/2024  PCP:  Renato Dorothey [name], NP   [facility] Providers Cardiologist:  Alvan Carrier, MD  Electrophysiologist:  Fonda Kitty, MD       Patient Profile: Connor Bryant is a 79 y.o. male with a hx of CAD s/p DES , AF s/p Wachman and catheter ablation , hx GIB, HFpEF, DMII  who is being seen 09/30/2024 for the evaluation of chest pain at the request of the Hospitalist Service.  History of Present Illness: Mr. Daft was admitted after a mechanical fall on 1/1 and was found to have a right peritrochanteric hip fracture. He underwent fixture on 09/24/2024. Post operatively, patient developed chest pain on the night of 09/30/2024. This prompted a Cardiology consult.  Patient started having right sided pain just past the midclavicular line in the rib space that started after working with physical therapy yesterday.  States the pain is sharp intermittent last less than a few seconds.  The pain is nonradiating, and worsens with sharp movement or with deep breathing.  He states laxatives helped improve the pain as well.  Patient has no other associated symptoms.  Review systems negative for shortness of breath, syncope.  Patient was found to have multivessel CAD of the LAD and RCA on in 08/2023. He was deemed not a candidate for CABG, and thus underwent complex PCI (DES to mLAD and DES to ostial RCA). TTE in 11/2023 shows an EF of 55-60%, trivial regurgitation. LDL on 08/24/2024 69. He is on atrovastatin 80mg , zetia , plavix  75mg , farxiga , losartan .  Of note, patient's Plavix  was was held on 1/1-09/25/2024.  Notable labs include hsTnT 135->138->141, hgb 8.3, sCr 1.29. ECG shows sinus rhythm with chronic anteroseptal infarct.  Past Medical History:  Diagnosis Date   Acute CVA (cerebrovascular accident) (HCC) 05/01/2020   Acute respiratory failure with hypoxia  (HCC) 05/04/2020   Arthritis    Atrial fibrillation (HCC)    DM type 2 (diabetes mellitus, type 2) (HCC)    no meds   Endotracheally intubated    Gallstone    Heart murmur    was told one time he had a murmur, not since 30 years ago   HTN (hypertension)    Hyperlipidemia    Myocardial infarct, old    2021   Personal history of colonic polyp-adenoma 11/24/2013   Presence of Watchman left atrial appendage closure device 07/23/2024   27 mm Device, JP   Renal insufficiency    mild   Thoracic spine fracture [facility])     Past Surgical History:  Procedure Laterality Date   ABDOMINAL AORTAGRAM N/A 02/23/2014   Procedure: ABDOMINAL EZELLA;  Surgeon: Redell LITTIE Door, MD;  Location: [facility] CATH LAB;  Service: Cardiovascular;  Laterality: N/A;   ATRIAL FIBRILLATION ABLATION N/A 05/11/2024   Procedure: ATRIAL FIBRILLATION ABLATION;  Surgeon: Kitty Fonda, MD;  Location: [facility] INVASIVE CV LAB;  Service: Cardiovascular;  Laterality: N/A;   BIOPSY  08/20/2023   Procedure: BIOPSY;  Surgeon: Federico Rosario JAYSON, MD;  Location: [facility] ENDOSCOPY;  Service: Gastroenterology;;   CATARACT EXTRACTION, BILATERAL     COLONOSCOPY     CORONARY ATHERECTOMY N/A 09/16/2023   Procedure: CORONARY ATHERECTOMY;  Surgeon: Jordan, Peter M, MD;  Location: [facility] INVASIVE CV LAB;  Service: Cardiovascular;  Laterality: N/A;   CORONARY LITHOTRIPSY N/A 09/16/2023   Procedure: CORONARY LITHOTRIPSY;  Surgeon: Jordan, Peter M, MD;  Location:  [facility] INVASIVE CV LAB;  Service: Cardiovascular;  Laterality: N/A;   CORONARY STENT INTERVENTION N/A 09/16/2023   Procedure: CORONARY STENT INTERVENTION;  Surgeon: Jordan, Peter M, MD;  Location: [facility] INVASIVE CV LAB;  Service: Cardiovascular;  Laterality: N/A;   CORONARY ULTRASOUND/IVUS N/A 09/16/2023   Procedure: Coronary Ultrasound/IVUS;  Surgeon: Jordan, Peter M, MD;  Location: [facility] INVASIVE CV LAB;  Service: Cardiovascular;  Laterality: N/A;   DEBRIDEMENT TENNIS ELBOW Right    ESOPHAGOGASTRODUODENOSCOPY      ESOPHAGOGASTRODUODENOSCOPY N/A 11/30/2023   Procedure: EGD (ESOPHAGOGASTRODUODENOSCOPY);  Surgeon: Eartha Flavors, Toribio, MD;  Location: [facility] ENDO SUITE;  Service: Gastroenterology;  Laterality: N/A;   ESOPHAGOGASTRODUODENOSCOPY (EGD) WITH PROPOFOL  N/A 08/20/2023   Procedure: ESOPHAGOGASTRODUODENOSCOPY (EGD) WITH PROPOFOL ;  Surgeon: Federico Rosario BROCKS, MD;  Location: [facility] ENDOSCOPY;  Service: Gastroenterology;  Laterality: N/A;   LEFT ATRIAL APPENDAGE OCCLUSION N/A 07/23/2024   Procedure: LEFT ATRIAL APPENDAGE OCCLUSION;  Surgeon: Kennyth Chew, MD;  Location: [facility] INVASIVE CV LAB;  Service: Cardiovascular;  Laterality: N/A;   MESENTERIC ARTERY BYPASS N/A 03/10/2014   Procedure: AORTO TO SUPERIOR MESENTERIC ARTERY BYPASS;  Surgeon: Lynwood JONETTA Collum, MD;  Location: [facility] OR;  Service: Vascular;  Laterality: N/A;   RIGHT/LEFT HEART CATH AND CORONARY ANGIOGRAPHY N/A 09/11/2023   Procedure: RIGHT/LEFT HEART CATH AND CORONARY ANGIOGRAPHY;  Surgeon: Rolan Ezra RAMAN, MD;  Location: [facility] INVASIVE CV LAB;  Service: Cardiovascular;  Laterality: N/A;   TONSILLECTOMY AND ADENOIDECTOMY     TRANSESOPHAGEAL ECHOCARDIOGRAM (CATH LAB) N/A 07/23/2024   Procedure: TRANSESOPHAGEAL ECHOCARDIOGRAM;  Surgeon: Kennyth Chew, MD;  Location: [facility] INVASIVE CV LAB;  Service: Cardiovascular;  Laterality: N/A;   vascualr surgery     illiac stents     Home Medications:  Prior to Admission medications  Medication Sig Start Date End Date Taking? Authorizing Provider  acetaminophen  (TYLENOL ) 325 MG tablet Take 2 tablets (650 mg total) by mouth every 4 (four) hours as needed for headache or mild pain (pain score 1-3). 07/24/24  Yes Lesia Ozell Barter, PA-C  allopurinol  (ZYLOPRIM ) 100 MG tablet Take 100 mg by mouth 2 (two) times daily.   Yes [provider]  atorvastatin  (LIPITOR ) 80 MG tablet Take 1 tablet (80 mg total) by mouth daily. 10/15/23  Yes Stacks, Butler, MD  Calcium  Carb-Cholecalciferol (CALCIUM  500 + D PO) Take 1  tablet by mouth daily.   Yes [provider]  carbidopa -levodopa  (SINEMET ) 10-100 MG tablet Take 1 tablet by mouth 3 (three) times daily. For parkinsonism 01/06/24  Yes Stacks, Butler, MD  clopidogrel  (PLAVIX ) 75 MG tablet Take 1 tablet (75 mg total) by mouth daily. 09/03/24  Yes Lesia Ozell Barter, PA-C  dapagliflozin  propanediol (FARXIGA ) 10 MG TABS tablet Take 1 tablet (10 mg total) by mouth daily. 02/03/24  Yes Milford, Harlene [name], FNP  ezetimibe  (ZETIA ) 10 MG tablet Take 1 tablet (10 mg total) by mouth daily. 08/25/24 11/23/24 Yes Rolan Ezra RAMAN, MD  ferrous sulfate  325 (65 FE) MG tablet Take 1 tablet (325 mg total) by mouth daily. 09/28/23  Yes Lee, Jordan, NP  hydrALAZINE  (APRESOLINE ) 100 MG tablet Take 1 tablet (100 mg total) by mouth 3 (three) times daily. 05/26/24  Yes Milford, Harlene [name], FNP  isosorbide  mononitrate (IMDUR ) 30 MG 24 hr tablet Take 1 tablet (30 mg total) by mouth daily. 05/26/24 09/25/24 Yes Milford, Harlene [name], FNP  losartan  (COZAAR ) 50 MG tablet Take 50 mg by mouth daily. 06/03/24  Yes [provider]  pantoprazole  (PROTONIX ) 40 MG tablet TAKE ONE TABLET TWICE  DAILY 03/30/24  Yes Stacks, Butler, MD  predniSONE  (DELTASONE ) 10 MG tablet Take 10 mg by mouth daily as needed (for inflammation in hands). 09/15/24  Yes [provider]  senna (SENOKOT) 8.6 MG TABS tablet Take 1 tablet (8.6 mg total) by mouth 2 (two) times daily for 14 days. 09/27/24 10/11/24 Yes Georgina Ozell LABOR, MD  Accu-Chek Softclix Lancets lancets SMARTSIG:Topical 1-3 Times Daily 06/25/23   [provider]  acetaminophen  (TYLENOL ) 500 MG tablet Take 2 tablets (1,000 mg total) by mouth every 8 (eight) hours for 14 days. 09/27/24 10/11/24  Georgina Ozell LABOR, MD  Blood Glucose Monitoring Suppl (ACCU-CHEK GUIDE) w/Device KIT CHECK BLOOD SUGAR UP TO THREE TIMES DAILY AS DIRECTED 06/25/23   [provider]  Blood Glucose Monitoring Suppl DEVI 1 each by Does not apply route in the morning, at  noon, and at bedtime. May substitute to any manufacturer covered by patient's insurance. 06/25/23   Zollie Butler, MD  oxyCODONE  (OXY IR/ROXICODONE ) 5 MG immediate release tablet Take 1-2 tablets (5-10 mg total) by mouth every 4 (four) hours as needed for up to 7 days for moderate pain (pain score 4-6) or severe pain (pain score 7-10). 09/27/24 10/04/24  Georgina Ozell LABOR, MD  polyethylene glycol (MIRALAX  / GLYCOLAX ) 17 g packet Take 17 g by mouth daily for 14 days. 09/27/24 10/11/24  Georgina Ozell LABOR, MD    Scheduled Meds:  acetaminophen   1,000 mg Oral Q8H   allopurinol   100 mg Oral BID   atorvastatin   80 mg Oral Daily   benzonatate   100 mg Oral TID   carbidopa -levodopa   1 tablet Oral TID   clopidogrel   75 mg Oral Daily   ezetimibe   10 mg Oral Daily   feeding supplement (GLUCERNA SHAKE)  237 mL Oral BID BM   hydrALAZINE   50 mg Oral TID   insulin  aspart  0-9 Units Subcutaneous TID AC & HS   losartan   50 mg Oral Daily   pantoprazole   40 mg Oral BID   Continuous Infusions:  PRN Meds: alum & mag hydroxide-simeth, melatonin, nitroGLYCERIN , oxyCODONE , polyethylene glycol, prochlorperazine   Allergies:   Allergies[1]  Social History:   Social History   Socioeconomic History   Marital status: Widowed    Spouse name: Not on file   Number of children: 1   Years of education: Not on file   Highest education level: High school graduate  Occupational History   Occupation: Retired  Tobacco Use   Smoking status: Former    Current packs/day: 0.00    Types: Cigarettes    Quit date: 07/08/2009    Years since quitting: 15.2    Passive exposure: Past   Smokeless tobacco: Never  Vaping Use   Vaping status: Never Used  Substance and Sexual Activity   Alcohol use: Yes    Alcohol/week: 5.0 standard drinks of alcohol    Types: 5 Cans of beer per week   Drug use: Not Currently   Sexual activity: Yes  Other Topics Concern   Not on file  Social History Narrative   Not on file   Social Drivers  of Health   Tobacco Use: Medium Risk (09/24/2024)   Patient History    Smoking Tobacco Use: Former    Smokeless Tobacco Use: Never    Passive Exposure: Past  Physicist, Medical Strain: Low Risk (12/09/2023)   Overall Financial Resource Strain (CARDIA)    Difficulty of Paying Living Expenses: Not hard at all  Food Insecurity: No Food Insecurity (  09/24/2024)   Epic    Worried About Programme Researcher, Broadcasting/film/video in the Last Year: Never true    Ran Out of Food in the Last Year: Never true  Transportation Needs: No Transportation Needs (09/24/2024)   Epic    Lack of Transportation (Medical): No    Lack of Transportation (Non-Medical): No  Physical Activity: Inactive (12/04/2023)   Exercise Vital Sign    Days of Exercise per Week: 0 days    Minutes of Exercise per Session: 0 min  Stress: No Stress Concern Present (12/09/2023)   [employer] of Occupational Health - Occupational Stress Questionnaire    Feeling of Stress : Not at all  Social Connections: Moderately Isolated (09/24/2024)   Social Connection and Isolation Panel    Frequency of Communication with Friends and Family: More than three times a week    Frequency of Social Gatherings with Friends and Family: More than three times a week    Attends Religious Services: Never    Database Administrator or Organizations: Yes    Attends Banker Meetings: Never    Marital Status: Widowed  Intimate Partner Violence: Not At Risk (09/24/2024)   Epic    Fear of Current or Ex-Partner: No    Emotionally Abused: No    Physically Abused: No    Sexually Abused: No  Depression (PHQ2-9): Low Risk (05/28/2024)   Depression (PHQ2-9)    PHQ-2 Score: 0  Alcohol Screen: Low Risk (12/04/2023)   Alcohol Screen    Last Alcohol Screening Score (AUDIT): 0  Housing: Low Risk (09/24/2024)   Epic    Unable to Pay for Housing in the Last Year: No    Number of Times Moved in the Last Year: 0    Homeless in the Last Year: No  Utilities: Not At Risk (09/24/2024)    Epic    Threatened with loss of utilities: No  Health Literacy: Adequate Health Literacy (12/09/2023)   B1300 Health Literacy    Frequency of need for help with medical instructions: Never    Family History:    Family History  Problem Relation Age of Onset   Diabetes Mother    Heart disease Mother        before age 23   Hyperlipidemia Mother    Hypertension Mother    AAA (abdominal aortic aneurysm) Mother    Stroke Father    Hypertension Father    Coronary artery disease Father    Hyperlipidemia Father    Diabetes Father    Bladder Cancer Father    Cancer Father    Heart disease Father    Heart attack Father    Colon cancer Neg Hx    Throat cancer Neg Hx    Kidney disease Neg Hx    Liver disease Neg Hx      ROS:  Please see the history of present illness.   All other ROS reviewed and negative.     Physical Exam/Data: Vitals:   09/29/24 1126 09/29/24 1621 09/29/24 2100 09/30/24 0137  BP: (!) 142/60 106/66 (!) 158/79 (!) 124/56  Pulse: 87 93 93 87  Resp:   18   Temp: (!) 97.4 F (36.3 C) (!) 97.5 F (36.4 C)  99 F (37.2 C)  TempSrc: Oral Oral    SpO2: 98% 98% 100% 97%  Weight:      Height:        Intake/Output Summary (Last 24 hours) at 09/30/2024 0353 Last data filed at 09/29/2024  2143 Gross per 24 hour  Intake --  Output 1550 ml  Net -1550 ml      09/24/2024   12:05 AM 09/23/2024   11:40 PM 08/24/2024    2:02 PM  Last 3 Weights  Weight (lbs) 185 lb 189 lb 9.5 oz 189 lb 9.6 oz  Weight (kg) 83.915 kg 86 kg 86.002 kg     Body mass index is 26.54 kg/m.  General:  Well nourished, well developed, in no acute distress HEENT: normal Neck: no JVD Vascular: No carotid bruits; Distal pulses 2+ bilaterally Cardiac:  normal S1, S2; RRR; no murmur  Lungs:  clear to auscultation bilaterally, no wheezing, rhonchi or rales  Abd: soft, nontender, no hepatomegaly  Ext: no edema Musculoskeletal:  No deformities, BUE and BLE strength normal and equal Skin: warm  and dry  Neuro:  CNs 2-12 intact, no focal abnormalities noted Psych:  Normal affect   EKG:  The EKG was personally reviewed and demonstrates:   Telemetry:  Telemetry was personally reviewed and demonstrates:  reviewed  Relevant CV Studies: reviewed  Laboratory Data: High Sensitivity Troponin:  No results for input(s): TROPONINIHS in the last 720 hours.  Recent Labs  Lab 09/29/24 2125 09/29/24 2359 09/30/24 0207  TRNPT 135* 138* 141*      Chemistry Recent Labs  Lab 09/24/24 0541 09/25/24 0117 09/29/24 0446 09/29/24 2359  NA 139 136 136 136  K 4.1 4.5 4.2 4.3  CL 103 102 100 98  CO2 26 24 26 26   GLUCOSE 182* 181* 158* 158*  BUN 29* 25* 33* 34*  CREATININE 1.54* 1.36* 1.29* 1.34*  CALCIUM  8.9 8.8* 9.1 9.1  MG 1.9 1.9 2.1  --   GFRNONAA 46* 53* 56* 54*  ANIONGAP 10 10 10 11     No results for input(s): PROT, ALBUMIN , AST, ALT, ALKPHOS, BILITOT in the last 168 hours. Lipids No results for input(s): CHOL, TRIG, HDL, LABVLDL, LDLCALC, CHOLHDL in the last 168 hours.  Hematology Recent Labs  Lab 09/27/24 0538 09/29/24 0446 09/29/24 2359  WBC 8.1 7.9 10.0  RBC 2.66* 2.77* 2.99*  HGB 8.0* 8.3* 8.8*  HCT 23.9* 25.3* 27.0*  MCV 89.8 91.3 90.3  MCH 30.1 30.0 29.4  MCHC 33.5 32.8 32.6  RDW 14.3 14.3 14.4  PLT 182 244 281   Thyroid  No results for input(s): TSH, FREET4 in the last 168 hours.  BNPNo results for input(s): BNP, PROBNP in the last 168 hours.  DDimer No results for input(s): DDIMER in the last 168 hours.  Radiology/Studies:  No results found.   Assessment and Plan:  CAHLIL SATTAR is a 79 y.o. male with a hx of CAD s/p DES , AF s/p Wachman and catheter ablation , hx GIB, HFpEF, DMII  who is being seen 09/30/2024 for the evaluation of chest pain and elevated troponins.  Patient's pain is noncardiac in nature.  His symptoms are suggestive of musculoskeletal pain that likely occurred when initiating physical therapy.  He  has no evidence of ischemia on ECG, and has a chronic anteroseptal infarct.  Patient has evidence of chronic myocardial injury.  I suspect this is in the setting of anemia.  He has been more profoundly anemic postoperatively.  He has previously been on iron  tablets, however has not been on them during this admission.  Would pursue workup for iron  deficiency anemia.  Would defer treatment of acute coronary syndrome.  The patient's pain changes in nature, could consider coronary CT in the future.  Recommendations -  obtain iron  labs -goal electrolytes K>4, Mg>2 -no further workup for ACS at this time   Risk Assessment/Risk Scores:    TIMI Risk Score for Unstable Angina or Non-ST Elevation MI:   The patient's TIMI risk score is  , which indicates a  % risk of all cause mortality, new or recurrent myocardial infarction or need for urgent revascularization in the next 14 days.         For questions or updates, please contact [facility] Please consult [email or web address] for contact info under      Signed, DaMarcus A Ingram, MD  09/30/2024 3:53 AM   Fairy JAYSON Sheen was seen by me today along with Laurene Pacini, MD. I have personally performed an evaluation on this patient.  My findings are as follows:  79 y.o. male with history of CAD s/p stenting, AF s/p watchman device placement, atrial fib ablation, GIB, HFpEF, DM admitted following hip fracture. He had right upper quadrant chest pain last night. Troponin minimally elevated and flat. His pain does not sound cardiac related.   Data: EKG(s) and pertinent labs, studies, etc were personally reviewed and interpreted by me:  NO EKG today Tele: sinus Otherwise, I agree with data as outlined by the advanced practice provider.  Exam performed by me: Gen: NAD Neck: No JVD Cardiac: RRR Lungs: Lungs clear bilaterally Extremities: No LE edema  My Assessment and Plan:  Atypical chest pain in pt with known CAD: He is found to have a  PE. He has been started on IV heparin . His pain is most likely not cardiac related. Echo to assess LV function.   Addendum: Echo with normal LV function. Moderate to severe AS. Will follow his AS as an outpatient. Of note, TEE in October 2025 with AVA of 1.4 cm by planimetry suggesting moderate AS.  10/01/23 3:00 pm  Cardiology will sign off. Please call with questions.    Signed,  Lonni Cash, MD  09/30/2024 12:38 PM       [1] No Known Allergies  "

## 2024-09-30 NOTE — Plan of Care (Addendum)
 79 year old man  history of CAD on Crestor, Plavix .  History of atrial fibrillation on Watchman device not on anticoagulation.  Patient is being admitted for intertrochanteric fracture of the right hip status post ORIF 09/24/2024.  Initially Plavix  on hold for 3 days and resumed on 1/3.     Patient complaining about chest pain around 9 PM.  Hemodynamically stable. Obtaining EKG and checking troponin.   Update, EKG reassuring it is showing normal sinus rhythm heart rate 93 and left ventricular hypertrophy pattern. Patient has multiple comorbidities including history of CAD status post stenting, A-fib on Watchman device and heart failure with preserved ejection fraction.   Giving patient morphine , Maalox and sublingual nitroglycerin . Will follow-up with troponin level.     Sherleen Pangborn, MD [facility] 09/29/2024, 9:17 PM       -Update, initial troponin 135.  -After receiving morphine  sublingual nitroglycerin  and chest pain completely subsided.  -Will follow-up with second troponin level if it is trending up in that case  will start IV heparin  drip. -Obtaining echocardiogram to assess for any wall motion abnormality. -Discussed with on-call cardiology Dr. Gail will review the patient chart and will inform him again if troponin continues to trend.   Update, flat troponin 135 and 138.  Continue to cycle troponin.     Syed Zukas, MD [facility] 09/29/2024, 11:14 PM           Pleuritic chest pain Elevated troponin secondary to demand ischemia-from elevated blood pressure versus recent hip fracture Now patient is stating that he developed chest pain when he cough.  Concern for pleuritic nature chest pain however given history of CAD and A-fib continue to cycle troponin and need to follow-up with echocardiogram. -As patient was off Plavix  for 3 days postsurgery and currently not on any anticoagulation/pharmacologic DVT prophylaxis at high risk for bleeding from recent  ORIF obtaining CTA chest to rule out PE. Discussed with orthopedic surgeon Dr. Genelle and he said that if requires  can start heparin  drip safely and can continue the Plavix  as well.     Rashad Obeid, MD [facility] 09/30/2024, 2:55 AM     Update, Acute pulmonary embolism - CTA chest showing positive for small right lower lobe segmental subsegmental PE. -Consulting pharmacy for IV heparin  drip. -Obtaining bilateral lower extremity ultrasound to rule out DVT.  Ronin Crager, MD [facility] 09/30/2024, 6:30 AM

## 2024-09-30 NOTE — TOC Progression Note (Signed)
 Transition of Care [facility]) - Progression Note    Patient Details  Name: Connor Bryant MRN: [id number] Date of Birth: 1946-08-09  [facility] CM/SW Contact  Sherline Clack, [place] Phone Number: 09/30/2024, 2:23 PM  Clinical Narrative:     CSW spoke with patient regarding SNF choice. Patient would like to go to [facility] in [place] for rehab and requested CSW start insurance auth. Patient shared he had recently been found to have a pulmonary embolism and was unsure of how long he would need to be in the hospital. CSW reached out to provider to inquire how long patient would be monitored for. Provider told CSW patient would need 48 hrs. CSW began insurance auth process with start date of 10/02/2024. CSW will continue to monitor patient's progress and will adjust insurance auth as needed.  Expected Discharge Plan: Skilled Nursing Facility Barriers to Discharge: Continued Medical Work up, English As A Second Language Teacher               Expected Discharge Plan and Services     Post Acute Care Choice: Skilled Nursing Facility Living arrangements for the past 2 months: Single Family Home                                       Social Drivers of Health (SDOH) Interventions SDOH Screenings   Food Insecurity: No Food Insecurity (09/24/2024)  Housing: Low Risk (09/24/2024)  Transportation Needs: No Transportation Needs (09/24/2024)  Utilities: Not At Risk (09/24/2024)  Alcohol Screen: Low Risk (12/04/2023)  Depression (PHQ2-9): Low Risk (05/28/2024)  Financial Resource Strain: Low Risk (12/09/2023)  Physical Activity: Inactive (12/04/2023)  Social Connections: Moderately Isolated (09/24/2024)  Stress: No Stress Concern Present (12/09/2023)  Tobacco Use: Medium Risk (09/24/2024)  Health Literacy: Adequate Health Literacy (12/09/2023)    Readmission Risk Interventions    12/02/2023   10:06 AM 11/29/2023   10:09 AM 11/27/2023    8:43 AM  Readmission Risk Prevention Plan   Transportation Screening Complete Complete Complete  HRI or Home Care Consult  Complete Complete  Social Work Consult for Recovery Care Planning/Counseling  Complete Complete  Palliative Care Screening  Not Applicable Not Applicable  Medication Review Oceanographer) Complete Complete Complete  HRI or Home Care Consult Complete    SW Recovery Care/Counseling Consult Complete    Palliative Care Screening Not Applicable    Skilled Nursing Facility Not Applicable

## 2024-09-30 NOTE — Progress Notes (Signed)
 PT Cancellation Note  Patient Details Name: Connor Bryant MRN: [id number] DOB: [birth year]   Cancelled Treatment:    Reason Eval/Treat Not Completed: Medical issues which prohibited therapy (Pt with chest pain yesterday. Started on Heparin  at 8 am. Will hold for 24 hours. Will follow up as able and appropriate.)  Connor Bryant, DPT, CLT  Acute Rehabilitation Services Office: [phone] (Secure chat preferred)   Connor Bryant 09/30/2024, 1:31 PM

## 2024-09-30 NOTE — Progress Notes (Signed)
 Bilateral lower extremity venous duplex has been completed.  Results can be found in chart review under CV Proc.  09/30/2024 5:34 PM  Giancarlo Askren Elden Appl, RVT.

## 2024-09-30 NOTE — Progress Notes (Signed)
 Pharmacy Consult for Heparin  gtt  Indication: pulmonary embolus  Allergies[1]  Patient Measurements: Height: 5' 10 (177.8 cm) Weight: 83.9 kg (185 lb) IBW/kg (Calculated) : 73 HEPARIN  DW (KG): 83.9  Vital Signs: Temp: 98.2 F (36.8 C) (01/07 0641) BP: 118/51 (01/07 0641) Pulse Rate: 84 (01/07 0641)  Labs: Recent Labs    09/29/24 0446 09/29/24 2359  HGB 8.3* 8.8*  HCT 25.3* 27.0*  PLT 244 281  CREATININE 1.29* 1.34*    Estimated Creatinine Clearance: 46.2 mL/min (A) (by C-G formula based on SCr of 1.34 mg/dL (H)).  Assessment: Connor Bryant a 79 y.o. male presented with hip fx, now with PE on CT angio s/p ORIF. Pharmacy has been consulted for heparin  dosing.   Anticoagulation PTA: Patient not on anticoagulation PTA.  Goal of Therapy:  Heparin  level 0.3-0.7 units/ml Monitor platelets by anticoagulation protocol: Yes   Plan:  Heparin  bolus via infusion 5,00 units x 1  Initiate heparin  infusion at 1,350 units/hour  CBC, Heparin  level daily  Heparin  level in 8 hours   Massie Fila, PharmD Clinical Pharmacist  09/30/2024 7:05 AM       [1] No Known Allergies

## 2024-09-30 NOTE — Plan of Care (Signed)
 Patient calm and cooperative A&O X4. Patient complained of chest pain, hostpialist immediately notified. Hospitalist placed new orders. Lab called with critical triponins. Order for STAT CT placed, CT representative asked to have larger gauge PIV placed. IV team placed 18 gauge upper right arm. CT was positive for a PE, more orders placed. Patient awaiting heparin  to be approved by pharmacy.   Problem: Education: Goal: Ability to describe self-care measures that may prevent or decrease complications (Diabetes Survival Skills Education) will improve Outcome: Progressing   Problem: Coping: Goal: Ability to adjust to condition or change in health will improve Outcome: Progressing   Problem: Health Behavior/Discharge Planning: Goal: Ability to identify and utilize available resources and services will improve Outcome: Progressing   Problem: Nutritional: Goal: Maintenance of adequate nutrition will improve Outcome: Progressing   Problem: Education: Goal: Knowledge of General Education information will improve Description: Including pain rating scale, medication(s)/side effects and non-pharmacologic comfort measures Outcome: Progressing   Problem: Nutrition: Goal: Adequate nutrition will be maintained Outcome: Progressing   Problem: Coping: Goal: Level of anxiety will decrease Outcome: Progressing   Problem: Pain Managment: Goal: General experience of comfort will improve and/or be controlled Outcome: Progressing   Problem: Safety: Goal: Ability to remain free from injury will improve Outcome: Progressing   Problem: Cardiac: Goal: Ability to maintain an adequate cardiac output Outcome: Progressing   Problem: Neurological: Goal: Will regain or maintain usual level of consciousness Outcome: Progressing

## 2024-09-30 NOTE — Telephone Encounter (Signed)
 Pharmacy Patient Advocate Encounter  Insurance verification completed.    The patient is insured through [facility]. Patient has Medicare and is not eligible for a copay card, but may be able to apply for patient assistance or Medicare RX Payment Plan (Patient Must reach out to their plan, if eligible for payment plan), if available.    Ran test claim for Eliquis  Starter Pack and the current 30 day co-pay is $307.49 due to deductible.   This test claim was processed through [facility]- copay amounts may vary at other pharmacies due to pharmacy/plan contracts, or as the patient moves through the different stages of their insurance plan.

## 2024-10-01 DIAGNOSIS — R079 Chest pain, unspecified: Secondary | ICD-10-CM

## 2024-10-01 DIAGNOSIS — S72141A Displaced intertrochanteric fracture of right femur, initial encounter for closed fracture: Secondary | ICD-10-CM | POA: Diagnosis not present

## 2024-10-01 LAB — BASIC METABOLIC PANEL WITH GFR
Anion gap: 10 (ref 5–15)
BUN: 33 mg/dL — ABNORMAL HIGH (ref 8–23)
CO2: 25 mmol/L (ref 22–32)
Calcium: 8.8 mg/dL — ABNORMAL LOW (ref 8.9–10.3)
Chloride: 99 mmol/L (ref 98–111)
Creatinine, Ser: 1.39 mg/dL — ABNORMAL HIGH (ref 0.61–1.24)
GFR, Estimated: 52 mL/min — ABNORMAL LOW
Glucose, Bld: 150 mg/dL — ABNORMAL HIGH (ref 70–99)
Potassium: 4 mmol/L (ref 3.5–5.1)
Sodium: 134 mmol/L — ABNORMAL LOW (ref 135–145)

## 2024-10-01 LAB — CBC
HCT: 23.3 % — ABNORMAL LOW (ref 39.0–52.0)
Hemoglobin: 7.5 g/dL — ABNORMAL LOW (ref 13.0–17.0)
MCH: 29.3 pg (ref 26.0–34.0)
MCHC: 32.2 g/dL (ref 30.0–36.0)
MCV: 91 fL (ref 80.0–100.0)
Platelets: 268 K/uL (ref 150–400)
RBC: 2.56 MIL/uL — ABNORMAL LOW (ref 4.22–5.81)
RDW: 14.4 % (ref 11.5–15.5)
WBC: 8.1 K/uL (ref 4.0–10.5)
nRBC: 0 % (ref 0.0–0.2)

## 2024-10-01 LAB — GLUCOSE, CAPILLARY
Glucose-Capillary: 130 mg/dL — ABNORMAL HIGH (ref 70–99)
Glucose-Capillary: 129 mg/dL — ABNORMAL HIGH (ref 70–99)
Glucose-Capillary: 163 mg/dL — ABNORMAL HIGH (ref 70–99)
Glucose-Capillary: 142 mg/dL — ABNORMAL HIGH (ref 70–99)

## 2024-10-01 LAB — HEPARIN LEVEL (UNFRACTIONATED)
Heparin Unfractionated: 0.18 [IU]/mL — ABNORMAL LOW (ref 0.30–0.70)
Heparin Unfractionated: 0.27 [IU]/mL — ABNORMAL LOW (ref 0.30–0.70)

## 2024-10-01 MED ORDER — ASPIRIN 81 MG PO TBEC
81.0000 mg | DELAYED_RELEASE_TABLET | Freq: Every day | ORAL | Status: DC
  Administered 2024-10-02: 81 mg via ORAL
  Filled 2024-10-01: qty 1

## 2024-10-01 NOTE — Progress Notes (Signed)
 Pharmacy Consult for Heparin  gtt  Indication: pulmonary embolus  Allergies[1]  Patient Measurements: Height: 5' 10 (177.8 cm) Weight: 83.9 kg (185 lb) IBW/kg (Calculated) : 73 HEPARIN  DW (KG): 83.9  Vital Signs: Temp: 97.6 F (36.4 C) (01/08 0733) BP: 101/56 (01/08 0733) Pulse Rate: 83 (01/08 0733)  Labs: Recent Labs    09/29/24 2359 09/30/24 0808 09/30/24 2042 10/01/24 0305 10/01/24 0844  HGB 8.8* 7.9*  --  7.5*  --   HCT 27.0* 23.9*  --  23.3*  --   PLT 281 275  --  268  --   HEPARINUNFRC  --   --  0.28*  --  0.18*  CREATININE 1.34* 1.29*  --  1.39*  --     Estimated Creatinine Clearance: 44.5 mL/min (A) (by C-G formula based on SCr of 1.39 mg/dL (H)).  Assessment: Connor Bryant a 79 y.o. male presented with hip fx, now with PE on CT angio s/p ORIF. Pharmacy has been consulted for heparin  dosing.   Anticoagulation PTA: Patient not on anticoagulation PTA.  1/8 AM: Repeat HL subtherapeutic at 0.28 following rate increase to 1450 units/hour. No signs of bleeding noted per RN, though heparin  keeps having to be paused for IV placement- pt has poor veins. RN estimates has only been off for 5-10 mins, though was having to be restarted every hour or so. Given unknown amount of time for heparin  appropriately infusing will minimally titrate with a level recheck.    Goal of Therapy:  Heparin  level 0.3-0.7 units/ml Monitor platelets by anticoagulation protocol: Yes   Plan:  Increase heparin  infusion to 1,500 units/hour  Heparin  level in 8 hours  CBC and heparin  level daily   Massie Fila, PharmD Clinical Pharmacist  10/01/2024 11:23 AM           [1] No Known Allergies

## 2024-10-01 NOTE — TOC Progression Note (Signed)
 Transition of Care [facility]) - Progression Note    Patient Details  Name: Connor Bryant MRN: [id number] Date of Birth: Jun 07, 1946  Transition of Care [facility]) CM/SW Contact  Sherline Clack, [place] Phone Number: 10/01/2024, 8:53 AM  Clinical Narrative:     Patient's authorization shows as approved on [employer] for 1/9-1/13, auth ID: [id number]. Provider and facility made aware. CSW will continue to monitor patient's medical readiness and will update DC plan. Facility has bed available for patient on Friday, 1/9.   Expected Discharge Plan: Skilled Nursing Facility Barriers to Discharge: Continued Medical Work up, English As A Second Language Teacher               Expected Discharge Plan and Services     Post Acute Care Choice: Skilled Nursing Facility Living arrangements for the past 2 months: Single Family Home                                       Social Drivers of Health (SDOH) Interventions SDOH Screenings   Food Insecurity: No Food Insecurity (09/24/2024)  Housing: Low Risk (09/24/2024)  Transportation Needs: No Transportation Needs (09/24/2024)  Utilities: Not At Risk (09/24/2024)  Alcohol Screen: Low Risk (12/04/2023)  Depression (PHQ2-9): Low Risk (05/28/2024)  Financial Resource Strain: Low Risk (12/09/2023)  Physical Activity: Inactive (12/04/2023)  Social Connections: Moderately Isolated (09/24/2024)  Stress: No Stress Concern Present (12/09/2023)  Tobacco Use: Medium Risk (09/24/2024)  Health Literacy: Adequate Health Literacy (12/09/2023)    Readmission Risk Interventions    12/02/2023   10:06 AM 11/29/2023   10:09 AM 11/27/2023    8:43 AM  Readmission Risk Prevention Plan  Transportation Screening Complete Complete Complete  HRI or Home Care Consult  Complete Complete  Social Work Consult for Recovery Care Planning/Counseling  Complete Complete  Palliative Care Screening  Not Applicable Not Applicable  Medication Review Oceanographer) Complete Complete Complete  HRI or  Home Care Consult Complete    SW Recovery Care/Counseling Consult Complete    Palliative Care Screening Not Applicable    Skilled Nursing Facility Not Applicable

## 2024-10-01 NOTE — Plan of Care (Addendum)
 Patient calm cooperative A&O X4. Heparin  drip maintained, lab increased drip rate. Patient PIV positional. Patient told to call nurse if IV pump alarmed. Patient expressed understanding, and called out appropriately. Patient left with call bell in reach and bed in lowest position.   Problem: Education: Goal: Ability to describe self-care measures that may prevent or decrease complications (Diabetes Survival Skills Education) will improve Outcome: Progressing   Problem: Coping: Goal: Ability to adjust to condition or change in health will improve Outcome: Progressing   Problem: Fluid Volume: Goal: Ability to maintain a balanced intake and output will improve Outcome: Progressing   Problem: Health Behavior/Discharge Planning: Goal: Ability to identify and utilize available resources and services will improve Outcome: Progressing   Problem: Nutritional: Goal: Maintenance of adequate nutrition will improve Outcome: Progressing   Problem: Skin Integrity: Goal: Risk for impaired skin integrity will decrease Outcome: Progressing   Problem: Tissue Perfusion: Goal: Adequacy of tissue perfusion will improve Outcome: Progressing   Problem: Education: Goal: Knowledge of General Education information will improve Description: Including pain rating scale, medication(s)/side effects and non-pharmacologic comfort measures Outcome: Progressing

## 2024-10-01 NOTE — Progress Notes (Signed)
 Physical Therapy Treatment Patient Details Name: Connor Bryant MRN: [id number] DOB: September 26, 1945 Today's Date: 10/01/2024   History of Present Illness Pt is 79 yo presenting to [facility] on 1/[facility] due to fall. Currently pt is s/p ORIF with cephalomedullary rod on 09/24/2024. PE found on 1/6 after pt reporting chest pain; heparin  was started. PMH: CAD s/p stenting, afib s/p watchman, GIB, HFpEF, DM II.    PT Comments  Pt continues to improve towards goals. Currently pt is Min A for bed mobility, Min to mod A for sit to stand and Mod A for step pivot to squat transfer pt modifying and changing technique half way through transfer due to fear of placing wgt through RLE. Pt lives alone without much assistance and is a high risk for falls/immobility. Due to pt current functional status, home set up and available assistance at home recommending skilled physical therapy services < 3 hours/day in order to address strength, balance and functional mobility to decrease risk for falls, injury, immobility, skin break down and re-hospitalization.      If plan is discharge home, recommend the following: Assist for transportation;Assistance with cooking/housework   Can travel by private vehicle     No  Equipment Recommendations  Wheelchair cushion (measurements PT);Wheelchair (measurements PT);Hospital bed       Precautions / Restrictions Precautions Precautions: Fall Recall of Precautions/Restrictions: Impaired Restrictions Weight Bearing Restrictions Per Provider Order: Yes RLE Weight Bearing Per Provider Order: Weight bearing as tolerated     Mobility  Bed Mobility Overal bed mobility: Needs Assistance Bed Mobility: Supine to Sit     Supine to sit: Min assist     General bed mobility comments: Min A with RLE and Min A with HHA for trunk elevation. Pt able to scoot forward to EOB with good clearance and increased time.    Transfers Overall transfer level: Needs assistance Equipment used: Rolling walker  (2 wheels) Transfers: Sit to/from Stand, Bed to chair/wheelchair/BSC Sit to Stand: Min assist, Mod assist   Step pivot transfers: Mod assist       General transfer comment: pt performed 3x sit to stand from EOB with good initial strength to get to standing requiring Min and up to Mod a for balance while situating hands on RW for balance. Pt was Mod A for step pivot from EOB to recliner with difficulty with food placement. Able to clear the RLE from the floor and step this session with very small scooting steps on the L with heavy UE support.    Ambulation/Gait     Pre-gait activities: Working on wgt shifting and clearing bil feet from floor. with Min to Mod A for balance. General Gait Details: unable at this time.     Balance Overall balance assessment: Needs assistance Sitting-balance support: Bilateral upper extremity supported, Feet supported Sitting balance-Leahy Scale: Fair Sitting balance - Comments: sitting EOB   Standing balance support: Bilateral upper extremity supported, During functional activity, Reliant on assistive device for balance Standing balance-Leahy Scale: Poor Standing balance comment: reliant on RW support          Communication Communication Communication: No apparent difficulties  Cognition Arousal: Alert Behavior During Therapy: WFL for tasks assessed/performed   PT - Cognitive impairments: No apparent impairments     Following commands: Intact      Cueing Cueing Techniques: Verbal cues, Visual cues     General Comments General comments (skin integrity, edema, etc.): Dressing dry and intact. Pt continues with anxiety about placing wgt through operative  leg. Worked on wgt shifting today with good results.      Pertinent Vitals/Pain Pain Assessment Pain Assessment: Faces Faces Pain Scale: Hurts a little bit Pain Location: R hip Pain Descriptors / Indicators: Discomfort, Grimacing, Aching, Numbness Pain Intervention(s): Monitored during  session, Limited activity within patient's tolerance     PT Goals (current goals can now be found in the care plan section) Acute Rehab PT Goals Patient Stated Goal: improve mobility and go home. PT Goal Formulation: With patient Time For Goal Achievement: 10/09/24 Potential to Achieve Goals: Good Progress towards PT goals: Progressing toward goals    Frequency    Min 3X/week      PT Plan  Continue with current POC        AM-PAC PT 6 Clicks Mobility   Outcome Measure  Help needed turning from your back to your side while in a flat bed without using bedrails?: A Little Help needed moving from lying on your back to sitting on the side of a flat bed without using bedrails?: A Little Help needed moving to and from a bed to a chair (including a wheelchair)?: A Lot Help needed standing up from a chair using your arms (e.g., wheelchair or bedside chair)?: A Little Help needed to walk in hospital room?: Total Help needed climbing 3-5 steps with a railing? : Total 6 Click Score: 13    End of Session Equipment Utilized During Treatment: Gait belt Activity Tolerance: Patient limited by pain;Patient tolerated treatment well;Patient limited by fatigue Patient left: in chair;with call bell/phone within reach;with chair alarm set Nurse Communication: Mobility status;Need for lift equipment PT Visit Diagnosis: Unsteadiness on feet (R26.81);Other abnormalities of gait and mobility (R26.89);Muscle weakness (generalized) (M62.81);Pain Pain - Right/Left: Right Pain - part of body: Hip     Time: 8691-8668 PT Time Calculation (min) (ACUTE ONLY): 23 min  Charges:    $Therapeutic Activity: 23-37 mins PT General Charges $$ ACUTE PT VISIT: 1 Visit                    Dorothyann Maier, DPT, CLT  Acute Rehabilitation Services Office: [phone] (Secure chat preferred)    Dorothyann VEAR Maier 10/01/2024, 2:30 PM

## 2024-10-01 NOTE — Plan of Care (Signed)

## 2024-10-01 NOTE — Progress Notes (Signed)
 " PROGRESS NOTE    Connor Bryant  FMW:[id number] DOB: Apr 03, 1946 DOA: 09/23/2024 PCP: Renato Dorothey [name], NP   Brief Narrative: 79 year old with past medical history significant for CAD status post stenting, A-fib status post Watchman no longer on Eliquis , history of GI bleed, heart failure preserved ejection fraction, diabetes type 2 who presents after a mechanical fall.  He was found to have right femoral neck fracture.  Ultimately transferred to [facility] from [facility] and underwent ORIF with cephalomedullary rod on 18/09/2024  Overnight patient developed chest pain, troponin came back elevated at 135, chest pain improved after nitroglycerin  and morphine .  Cardiology was consulted.  Subsequently CT angio chest was obtained and was positive for acute PE patient was started on IV heparin .   Assessment & Plan:   Principal Problem:   Intertrochanteric fracture of right hip (HCC) Active Problems:   ABLA (acute blood loss anemia)   Coronary artery disease involving native coronary artery of native heart without angina pectoris   Atrial fibrillation (HCC)   Essential hypertension   Type 2 diabetes mellitus without complication, without long-term current use of insulin  (HCC)   HLD (hyperlipidemia)   GERD (gastroesophageal reflux disease)   Chronic kidney disease, stage 3b (HCC)   Parkinsonism (HCC)   Acute pulmonary embolism (HCC)   1-Intertrochanteric fracture of the right hip: - Patient presented after a mechanical fall at home - Sustained right femoral neck fracture after the fall - Underwent ORIF with cephalomedullary rod on 09/24/2024 - He will need rehab for physical therapy rehabilitation  Acute PE: Chest pain -Patient developed chest pain overnight, troponin mildly elevated, EKG no significant changes.  Cardiology consulted -Sequently CT angio chest was obtained and was positive for a small PE -ECHO:  EF 55--60 % grade 2 diastolic Dysfunction, Normal RV function and LE  doppler. Negative for DVT  ABLA Post surgery - Hemoglobin decreased postsurgery.  Presents with a hemoglobin of 11.2, post sx hb decreased to 8 - Continue to monitor hemoglobin Received IV iron ., no melena , no evidence of active bleeding. Hb slowly trending down.   Iron  deficiency anemia: Received IV iron    CAD -Continue Crestor -Plavix  was resumed 1/3 - Cardiology now the patient was going to be on Eliquis  IV heparin  for new PE we can change Plavix  to aspirin   A-fib -Status post Watchman device, history of GI bleed.   Parkinsonism -Continue Sinemet   CKD stage IIIb: -monitor renal function.   GERD: -Continue PPI         Estimated body mass index is 26.54 kg/m as calculated from the following:   Height as of this encounter: 5' 10 (1.778 m).   Weight as of this encounter: 83.9 kg.   DVT prophylaxis: heparin   Code Status: Full code Family Communication: care discussed with patient.  Disposition Plan:  Status is: Inpatient Remains inpatient appropriate because: now management of  PE, monitor hb.     Consultants:  Cardiology Georgina Sharper, Ortho  Procedures:  Underwent ORIF with cephalomedullary rod on 09/24/2024   Antimicrobials:    Subjective: He is alert, denies BM.  Report chest pain has almost resolve.    Objective: Vitals:   09/30/24 2046 09/30/24 2144 09/30/24 2355 10/01/24 0422  BP: (!) 127/59 128/62 (!) 102/50 (!) 113/55  Pulse: 84  85 85  Resp: 18  18 18   Temp: 98.5 F (36.9 C)  97.8 F (36.6 C) 97.9 F (36.6 C)  TempSrc:      SpO2: 96%  94%  100%  Weight:      Height:        Intake/Output Summary (Last 24 hours) at 10/01/2024 0714 Last data filed at 09/30/2024 2300 Gross per 24 hour  Intake 389 ml  Output 1150 ml  Net -761 ml   Filed Weights   09/23/24 2340 09/24/24 0005  Weight: 86 kg 83.9 kg    Examination:  General exam: NAD Respiratory system: CTA Cardiovascular system: SS 1, S 2 RRR Gastrointestinal system: BS  present, soft, nt Extremities: no edema, site of surgery with clean dressing no edema    Data Reviewed: I have personally reviewed following labs and imaging studies  CBC: Recent Labs  Lab 09/27/24 0538 09/29/24 0446 09/29/24 2359 09/30/24 0808 10/01/24 0305  WBC 8.1 7.9 10.0 8.0 8.1  NEUTROABS 5.4 5.6  --   --   --   HGB 8.0* 8.3* 8.8* 7.9* 7.5*  HCT 23.9* 25.3* 27.0* 23.9* 23.3*  MCV 89.8 91.3 90.3 89.8 91.0  PLT 182 244 281 275 268   Basic Metabolic Panel: Recent Labs  Lab 09/25/24 0117 09/29/24 0446 09/29/24 2359 09/30/24 0808 10/01/24 0305  NA 136 136 136 135 134*  K 4.5 4.2 4.3 4.2 4.0  CL 102 100 98 98 99  CO2 24 26 26 27 25   GLUCOSE 181* 158* 158* 147* 150*  BUN 25* 33* 34* 32* 33*  CREATININE 1.36* 1.29* 1.34* 1.29* 1.39*  CALCIUM  8.8* 9.1 9.1 8.9 8.8*  MG 1.9 2.1  --   --   --   PHOS 3.7  --   --   --   --    GFR: Estimated Creatinine Clearance: 44.5 mL/min (A) (by C-G formula based on SCr of 1.39 mg/dL (H)). Liver Function Tests: No results for input(s): AST, ALT, ALKPHOS, BILITOT, PROT, ALBUMIN  in the last 168 hours. No results for input(s): LIPASE, AMYLASE in the last 168 hours. No results for input(s): AMMONIA in the last 168 hours. Coagulation Profile: No results for input(s): INR, PROTIME in the last 168 hours.  Cardiac Enzymes: No results for input(s): CKTOTAL, CKMB, CKMBINDEX, TROPONINI in the last 168 hours. BNP (last 3 results) Recent Labs    09/30/24 0808  PROBNP 2,373.0*   HbA1C: No results for input(s): HGBA1C in the last 72 hours. CBG: Recent Labs  Lab 09/29/24 2152 09/30/24 0758 09/30/24 1204 09/30/24 1641 09/30/24 2047  GLUCAP 131* 152* 160* 138* 151*   Lipid Profile: No results for input(s): CHOL, HDL, LDLCALC, TRIG, CHOLHDL, LDLDIRECT in the last 72 hours. Thyroid  Function Tests: No results for input(s): TSH, T4TOTAL, FREET4, T3FREE, THYROIDAB in the last 72  hours. Anemia Panel: Recent Labs    09/30/24 0808  VITAMINB12 357  FOLATE 8.0  FERRITIN 299  TIBC 214*  IRON  17*  RETICCTPCT 3.4*   Sepsis Labs: No results for input(s): PROCALCITON, LATICACIDVEN in the last 168 hours.  No results found for this or any previous visit (from the past 240 hours).       Radiology Studies: CT Angio Chest Pulmonary Embolism (PE) W or WO Contrast Addendum Date: 09/30/2024 ADDENDUM: Positive PE finding was discussed by telephone with Floor Nurse Crystal at [phone] hours on 09/30/2024. ---------------------------------------------------- Electronically signed by: Helayne Hurst MD 09/30/2024 06:47 AM EST RP Workstation: [id number]   Result Date: 09/30/2024  EXAM: CTA of the Chest with contrast for PE 09/30/2024 06:12:49 AM TECHNIQUE: CTA of the chest was performed after the administration of 75 mL of iohexol  (OMNIPAQUE ) 350 MG/ML intravenous contrast. Multiplanar  reformatted images are provided for review. MIP images are provided for review. Automated exposure control, iterative reconstruction, and/or weight based adjustment of the mA/kV was utilized to reduce the radiation dose to as low as reasonably achievable. COMPARISON: CTA chest 05/08/2020. CLINICAL HISTORY: 79 year old male. Rule out pulmonary embolism. Status post right femur fracture and open reduction internal fixation. FINDINGS: PULMONARY ARTERIES: Pulmonary arteries are adequately opacified for evaluation. Right lower lobe segmental or subsegmental pulmonary artery thrombus is present in the posterior basal segment region (series 7, image 165). No other pulmonary artery filling defect identified. Main pulmonary artery is normal in caliber. MEDIASTINUM: The heart demonstrates a mitral valve prosthesis and severe calcified coronary artery atherosclerosis. No pericardial effusion. There is no acute abnormality of the thoracic aorta. The aorta is heavily calcified and demonstrates chronic tortuosity of the  distal descending thoracic aorta, which is stable. No contrast is seen within the aorta. LYMPH NODES: No mediastinal, hilar or axillary lymphadenopathy. LUNGS AND PLEURA: Lung volumes are similar to the 2021 CTA with mild atelectatic changes to the major airways. Asymmetric right lower lobe bronchial wall thickening is noted (series 6, image 83). There is streaky and confluent right lower lobe basilar segmental opacity which may be a combination of infarction and atelectasis. Contralateral dependent left lower lobe atelectasis is present. No other confluent lung opacity. Trace right pleural effusion. No pneumothorax. UPPER ABDOMEN: Limited images of the upper abdomen demonstrate punctate gallstones within the neck of the gallbladder, which otherwise appears grossly stable. SOFT TISSUES AND BONES: Osteopenia. Moderate chronic T12 compression fracture is stable since 2021. Mild T11 compression fracture is stable. Mild T9 compression fracture is stable. Mild T4 inferior endplate compression is new since 2021 but otherwise age indeterminate, with 15% loss of vertebral body height. No retropulsion. Chronic distal left clavicle fracture is partially visible. Otherwise, no acute osseous abnormality. IMPRESSION: 1. Positive for small right lower lobe segmental or subsegmental pulmonary artery embolus. Right lower lobe basilar opacity may represent a combination of infarct and atelectasis, with trace right pleural effusion. 2. Mild T4 inferior endplate compression fracture, new since 2021 but the age is indeterminate (15% loss of height). 3. Advanced aortic and coronary artery atherosclerosis. 4. Chronic cholelithiasis. Electronically signed by: Helayne Hurst MD 09/30/2024 06:26 AM EST RP Workstation: [id number]        Scheduled Meds:  acetaminophen   1,000 mg Oral Q8H   allopurinol   100 mg Oral BID   atorvastatin   80 mg Oral Daily   benzonatate   100 mg Oral TID   carbidopa -levodopa   1 tablet Oral TID   clopidogrel    75 mg Oral Daily   vitamin B-12  500 mcg Oral Daily   ezetimibe   10 mg Oral Daily   feeding supplement (GLUCERNA SHAKE)  237 mL Oral BID BM   hydrALAZINE   50 mg Oral TID   insulin  aspart  0-9 Units Subcutaneous TID AC & HS   losartan   50 mg Oral Daily   pantoprazole   40 mg Oral BID   Continuous Infusions:  heparin  1,450 Units/hr (10/01/24 0145)     LOS: 7 days    Time spent: 35 Minutes    Jilian West A Nayali Talerico, MD [facility]   If 7PM-7AM, please contact night-coverage [email or web address]  10/01/2024, 7:14 AM   "

## 2024-10-02 ENCOUNTER — Telehealth (HOSPITAL_COMMUNITY): Payer: Self-pay

## 2024-10-02 ENCOUNTER — Other Ambulatory Visit (HOSPITAL_COMMUNITY): Payer: Self-pay

## 2024-10-02 DIAGNOSIS — S72141A Displaced intertrochanteric fracture of right femur, initial encounter for closed fracture: Secondary | ICD-10-CM | POA: Diagnosis not present

## 2024-10-02 LAB — CBC
HCT: 26.3 % — ABNORMAL LOW (ref 39.0–52.0)
Hemoglobin: 8.5 g/dL — ABNORMAL LOW (ref 13.0–17.0)
MCH: 29.4 pg (ref 26.0–34.0)
MCHC: 32.3 g/dL (ref 30.0–36.0)
MCV: 91 fL (ref 80.0–100.0)
Platelets: 308 K/uL (ref 150–400)
RBC: 2.89 MIL/uL — ABNORMAL LOW (ref 4.22–5.81)
RDW: 14.5 % (ref 11.5–15.5)
WBC: 7.5 K/uL (ref 4.0–10.5)
nRBC: 0 % (ref 0.0–0.2)

## 2024-10-02 LAB — GLUCOSE, CAPILLARY
Glucose-Capillary: 131 mg/dL — ABNORMAL HIGH (ref 70–99)
Glucose-Capillary: 152 mg/dL — ABNORMAL HIGH (ref 70–99)

## 2024-10-02 LAB — HEPARIN LEVEL (UNFRACTIONATED): Heparin Unfractionated: 0.41 [IU]/mL (ref 0.30–0.70)

## 2024-10-02 MED ORDER — ASPIRIN 81 MG PO TBEC: 81.0000 mg | DELAYED_RELEASE_TABLET | Freq: Every day | ORAL | 12 refills | Status: AC

## 2024-10-02 MED ORDER — APIXABAN 5 MG PO TABS: 5.0000 mg | ORAL_TABLET | Freq: Two times a day (BID) | ORAL | Status: DC

## 2024-10-02 MED ORDER — APIXABAN 5 MG PO TABS: ORAL_TABLET | ORAL | 0 refills | Status: AC

## 2024-10-02 MED ORDER — CYANOCOBALAMIN 500 MCG PO TABS: 500.0000 ug | ORAL_TABLET | Freq: Every day | ORAL | 0 refills | Status: AC

## 2024-10-02 MED ORDER — HYDRALAZINE HCL 50 MG PO TABS: 50.0000 mg | ORAL_TABLET | Freq: Three times a day (TID) | ORAL | 0 refills | Status: AC

## 2024-10-02 MED ORDER — APIXABAN 5 MG PO TABS
10.0000 mg | ORAL_TABLET | Freq: Two times a day (BID) | ORAL | Status: DC
  Administered 2024-10-02: 10 mg via ORAL
  Filled 2024-10-02: qty 2

## 2024-10-02 NOTE — Progress Notes (Signed)
 Completed and explained AVS to patient. He awaits transfer via PTAR to SNF once the room has been prepared for his arrival SW will arrange for transport. Until then the patient will remain in his room. He is incontinent and a high falls risk making him not appropriate for the lounge. RN  is aware.  SWOT RN

## 2024-10-02 NOTE — Plan of Care (Signed)
" °  Problem: Education: Goal: Ability to describe self-care measures that may prevent or decrease complications (Diabetes Survival Skills Education) will improve Outcome: Progressing Goal: Individualized Educational Video(s) Outcome: Progressing   Problem: Coping: Goal: Ability to adjust to condition or change in health will improve Outcome: Progressing   Problem: Fluid Volume: Goal: Ability to maintain a balanced intake and output will improve Outcome: Progressing   Problem: Health Behavior/Discharge Planning: Goal: Ability to identify and utilize available resources and services will improve Outcome: Progressing Goal: Ability to manage health-related needs will improve Outcome: Progressing   Problem: Metabolic: Goal: Ability to maintain appropriate glucose levels will improve Outcome: Progressing   Problem: Nutritional: Goal: Maintenance of adequate nutrition will improve Outcome: Progressing Goal: Progress toward achieving an optimal weight will improve Outcome: Progressing   Problem: Skin Integrity: Goal: Risk for impaired skin integrity will decrease Outcome: Progressing   Problem: Tissue Perfusion: Goal: Adequacy of tissue perfusion will improve Outcome: Progressing   Problem: Education: Goal: Knowledge of General Education information will improve Description: Including pain rating scale, medication(s)/side effects and non-pharmacologic comfort measures Outcome: Progressing   Problem: Health Behavior/Discharge Planning: Goal: Ability to manage health-related needs will improve Outcome: Progressing   Problem: Clinical Measurements: Goal: Ability to maintain clinical measurements within normal limits will improve Outcome: Progressing Goal: Will remain free from infection Outcome: Progressing Goal: Diagnostic test results will improve Outcome: Progressing Goal: Respiratory complications will improve Outcome: Progressing Goal: Cardiovascular complication will  be avoided Outcome: Progressing   Problem: Activity: Goal: Risk for activity intolerance will decrease Outcome: Progressing   Problem: Nutrition: Goal: Adequate nutrition will be maintained Outcome: Progressing   Problem: Coping: Goal: Level of anxiety will decrease Outcome: Progressing   Problem: Elimination: Goal: Will not experience complications related to bowel motility Outcome: Progressing   Problem: Pain Managment: Goal: General experience of comfort will improve and/or be controlled Outcome: Progressing   Problem: Safety: Goal: Ability to remain free from injury will improve Outcome: Progressing   Problem: Skin Integrity: Goal: Risk for impaired skin integrity will decrease Outcome: Progressing   Problem: Education: Goal: Knowledge of the prescribed therapeutic regimen will improve Outcome: Progressing   Problem: Bowel/Gastric: Goal: Gastrointestinal status for postoperative course will improve Outcome: Progressing   Problem: Nutritional: Goal: Will attain and maintain optimal nutritional status Outcome: Progressing   Problem: Clinical Measurements: Goal: Ability to maintain clinical measurements within normal limits Outcome: Progressing Goal: Postoperative complications will be avoided or minimized Outcome: Progressing   Problem: Skin Integrity: Goal: Demonstrates signs of wound healing without infection Outcome: Progressing   Problem: Urinary Elimination: Goal: Will remain free from infection Outcome: Progressing Goal: Ability to achieve and maintain adequate urine output Outcome: Progressing   "

## 2024-10-02 NOTE — Progress Notes (Signed)
 Discharge report called to [facility] and given to [place], LPN. Awaiting [facility] for transport to facility.

## 2024-10-02 NOTE — Progress Notes (Signed)
 Physical Therapy Treatment Patient Details Name: Connor Bryant MRN: [id number] DOB: 08-27-1946 Today's Date: 10/02/2024   History of Present Illness Pt is 79 yo presenting to [facility] on 1/1 due to fall. Currently pt is s/p ORIF with cephalomedullary rod on 09/24/2024. PE found on 1/6 after pt reporting chest pain; heparin  was started. PMH: CAD s/p stenting, afib s/p watchman, GIB, HFpEF, DM II.    PT Comments  Pt received in supine and agreeable to session. Pt demonstrates improved mobility requiring less assist this session. Pt continues to be limited by R hip pain and quick fatigue. Pt able to perform bed mobility and transfers with min A this session. Pt demonstrates improved LLE clearance and upright posture this session. Pt able to tolerate additional stand from the recliner and attempt standing marches, but is unable to tolerate increased RLE WB. Pt continues to benefit from PT services to progress toward functional mobility goals.     If plan is discharge home, recommend the following: Assist for transportation;Assistance with cooking/housework   Can travel by private vehicle     No  Equipment Recommendations  Wheelchair cushion (measurements PT);Wheelchair (measurements PT);Hospital bed    Recommendations for Other Services       Precautions / Restrictions Precautions Precautions: Fall Recall of Precautions/Restrictions: Impaired Restrictions Weight Bearing Restrictions Per Provider Order: Yes RLE Weight Bearing Per Provider Order: Weight bearing as tolerated     Mobility  Bed Mobility Overal bed mobility: Needs Assistance Bed Mobility: Supine to Sit     Supine to sit: Min assist, HOB elevated, Used rails     General bed mobility comments: Min A with RLE and scooting to EOB. Cues for technique and use of rail    Transfers Overall transfer level: Needs assistance Equipment used: Rolling walker (2 wheels) Transfers: Sit to/from Stand, Bed to chair/wheelchair/BSC Sit to  Stand: Min assist, From elevated surface   Step pivot transfers: Min assist       General transfer comment: STS from elevated EOB and recliner with min A for power up and cues for hand placement. Pivot to recliner with assist for RW management. Pt with improved LLE clearance and upright posture. Heavy BUE support to offload RLE    Ambulation/Gait               General Gait Details: unable at this time.   Stairs             Wheelchair Mobility     Tilt Bed    Modified Rankin (Stroke Patients Only)       Balance Overall balance assessment: Needs assistance Sitting-balance support: Bilateral upper extremity supported, Feet supported Sitting balance-Leahy Scale: Fair Sitting balance - Comments: sitting EOB   Standing balance support: Bilateral upper extremity supported, During functional activity, Reliant on assistive device for balance Standing balance-Leahy Scale: Poor Standing balance comment: reliant on RW support                            Communication Communication Communication: No apparent difficulties  Cognition Arousal: Alert Behavior During Therapy: WFL for tasks assessed/performed   PT - Cognitive impairments: No apparent impairments                         Following commands: Intact      Cueing Cueing Techniques: Verbal cues, Visual cues  Exercises General Exercises - Lower Extremity Long Arc Quad: AROM, Seated, 10  reps, Both    General Comments        Pertinent Vitals/Pain Pain Assessment Pain Assessment: 0-10 Pain Score: 5  Pain Location: R hip Pain Descriptors / Indicators: Discomfort, Grimacing, Aching Pain Intervention(s): Monitored during session, Repositioned, Limited activity within patient's tolerance     PT Goals (current goals can now be found in the care plan section) Acute Rehab PT Goals Patient Stated Goal: improve mobility and go home. PT Goal Formulation: With patient Time For Goal  Achievement: 10/09/24 Progress towards PT goals: Progressing toward goals    Frequency    Min 3X/week       AM-PAC PT 6 Clicks Mobility   Outcome Measure  Help needed turning from your back to your side while in a flat bed without using bedrails?: A Little Help needed moving from lying on your back to sitting on the side of a flat bed without using bedrails?: A Little Help needed moving to and from a bed to a chair (including a wheelchair)?: A Little Help needed standing up from a chair using your arms (e.g., wheelchair or bedside chair)?: A Little Help needed to walk in hospital room?: Total Help needed climbing 3-5 steps with a railing? : Total 6 Click Score: 14    End of Session Equipment Utilized During Treatment: Gait belt Activity Tolerance: Patient limited by pain;Patient tolerated treatment well Patient left: in chair;with call bell/phone within reach;with chair alarm set Nurse Communication: Mobility status;Need for lift equipment PT Visit Diagnosis: Unsteadiness on feet (R26.81);Other abnormalities of gait and mobility (R26.89);Muscle weakness (generalized) (M62.81);Pain Pain - Right/Left: Right Pain - part of body: Hip     Time: 9063-9040 PT Time Calculation (min) (ACUTE ONLY): 23 min  Charges:    $Therapeutic Activity: 23-37 mins PT General Charges $$ ACUTE PT VISIT: 1 Visit                    Darryle George, PTA Acute Rehabilitation Services Secure Chat Preferred  [phone]    Darryle George 10/02/2024, 10:21 AM

## 2024-10-02 NOTE — Discharge Summary (Signed)
 " Physician Discharge Summary   Patient: Connor Bryant MRN: [id number] DOB: 01-01-1946  Admit date:     09/23/2024  Discharge date: 10/02/2024  Discharge Physician: Owen DELENA Lore   PCP: Renato Dorothey [name], NP   Recommendations at discharge:   Needs to monitor CBC and monitor hb Needs Bmet to monitor renal function. Resume cozaar  if renal function stable.  Need tx for PE for 3-6 months.  Follow up with Orthopedic.   Discharge Diagnoses: Principal Problem:   Intertrochanteric fracture of right hip (HCC) Active Problems:   ABLA (acute blood loss anemia)   Coronary artery disease involving native coronary artery of native heart without angina pectoris   Atrial fibrillation (HCC)   Essential hypertension   Type 2 diabetes mellitus without complication, without long-term current use of insulin  (HCC)   HLD (hyperlipidemia)   GERD (gastroesophageal reflux disease)   Chronic kidney disease, stage 3b (HCC)   Parkinsonism (HCC)   Acute pulmonary embolism (HCC)   Chest pain of uncertain etiology  Resolved Problems:   * No resolved hospital problems. *  Hospital Course: 79 year old with past medical history significant for CAD status post stenting, A-fib status post Watchman no longer on Eliquis , history of GI bleed, heart failure preserved ejection fraction, diabetes type 2 who presents after a mechanical fall.  He was found to have right femoral neck fracture.  Ultimately transferred to [facility] from [facility] and underwent ORIF with cephalomedullary rod on 18/09/2024   Overnight patient developed chest pain, troponin came back elevated at 135, chest pain improved after nitroglycerin  and morphine .  Cardiology was consulted.  Subsequently CT angio chest was obtained and was positive for acute PE patient was started on IV heparin .     Assessment and Plan: 1-Intertrochanteric fracture of the right hip: - Patient presented after a mechanical fall at home - Sustained right femoral neck  fracture after the fall - Underwent ORIF with cephalomedullary rod on 09/24/2024 - He will need rehab for physical therapy rehabilitation  stable.   Acute PE: Chest pain -Patient developed chest pain overnight, troponin mildly elevated, EKG no significant changes.  Cardiology consulted -Sequently CT angio chest was obtained and was positive for a small PE -ECHO:  EF 55--60 % grade 2 diastolic Dysfunction, Normal RV function and LE doppler. Negative for DVT  Hb stable,, transition to Eliquis .   ABLA Post surgery - Hemoglobin decreased postsurgery.  Presents with a hemoglobin of 11.2, post sx hb decreased to 8 - Continue to monitor hemoglobin Received IV iron ., no melena , no evidence of active bleeding. Hb stable today at 8.5 Transition to eliquis  from heparin .    Iron  deficiency anemia: Received IV iron      CAD -Continue Crestor -Plavix  was resumed 1/3 - Cardiology now the patient was going to be on Eliquis  IV heparin  for new PE we can change Plavix  to aspirin    A-fib -Status post Watchman device, history of GI bleed.    Parkinsonism -Continue Sinemet    CKD stage IIIb: -monitor renal function.    GERD: -Continue PPI         Consultants: Ortho, cardiology  Procedures performed:  - Underwent ORIF with cephalomedullary rod on 09/24/2024 Disposition: Skilled nursing facility Diet recommendation:  Cardiac diet DISCHARGE MEDICATION: Allergies as of 10/02/2024   No Known Allergies      Medication List     STOP taking these medications    clopidogrel  75 MG tablet Commonly known as: PLAVIX    losartan  50 MG  tablet Commonly known as: COZAAR        TAKE these medications    carbidopa -levodopa  10-100 MG tablet Commonly known as: Sinemet  Take 1 tablet by mouth 3 (three) times daily. For parkinsonism The timing of this medication is very important.   Accu-Chek Softclix Lancets lancets SMARTSIG:Topical 1-3 Times Daily   acetaminophen  500 MG tablet Commonly  known as: TYLENOL  Take 2 tablets (1,000 mg total) by mouth every 8 (eight) hours for 14 days. What changed:  medication strength how much to take when to take this reasons to take this   allopurinol  100 MG tablet Commonly known as: ZYLOPRIM  Take 100 mg by mouth 2 (two) times daily.   apixaban  5 MG Tabs tablet Commonly known as: ELIQUIS  Take 2 tablets (10 mg total) by mouth 2 (two) times daily for 7 days, THEN 1 tablet (5 mg total) 2 (two) times daily. Start taking on: October 02, 2024   aspirin  EC 81 MG tablet Take 1 tablet (81 mg total) by mouth daily. Swallow whole. Start taking on: October 03, 2024   atorvastatin  80 MG tablet Commonly known as: LIPITOR  Take 1 tablet (80 mg total) by mouth daily.   Blood Glucose Monitoring Suppl Devi 1 each by Does not apply route in the morning, at noon, and at bedtime. May substitute to any manufacturer covered by patient's insurance.   Accu-Chek Guide w/Device Kit CHECK BLOOD SUGAR UP TO THREE TIMES DAILY AS DIRECTED   CALCIUM  500 + D PO Take 1 tablet by mouth daily.   cyanocobalamin  500 MCG tablet Commonly known as: VITAMIN B12 Take 1 tablet (500 mcg total) by mouth daily. Start taking on: October 03, 2024   dapagliflozin  propanediol 10 MG Tabs tablet Commonly known as: FARXIGA  Take 1 tablet (10 mg total) by mouth daily.   ezetimibe  10 MG tablet Commonly known as: ZETIA  Take 1 tablet (10 mg total) by mouth daily.   ferrous sulfate  325 (65 FE) MG tablet Take 1 tablet (325 mg total) by mouth daily.   hydrALAZINE  50 MG tablet Commonly known as: APRESOLINE  Take 1 tablet (50 mg total) by mouth 3 (three) times daily. What changed:  medication strength how much to take   isosorbide  mononitrate 30 MG 24 hr tablet Commonly known as: IMDUR  Take 1 tablet (30 mg total) by mouth daily.   oxyCODONE  5 MG immediate release tablet Commonly known as: Oxy IR/ROXICODONE  Take 1-2 tablets (5-10 mg total) by mouth every 4 (four) hours as  needed for up to 7 days for moderate pain (pain score 4-6) or severe pain (pain score 7-10).   pantoprazole  40 MG tablet Commonly known as: PROTONIX  TAKE ONE TABLET TWICE DAILY   polyethylene glycol 17 g packet Commonly known as: MIRALAX  / GLYCOLAX  Take 17 g by mouth daily for 14 days.   predniSONE  10 MG tablet Commonly known as: DELTASONE  Take 10 mg by mouth daily as needed (for inflammation in hands).   senna 8.6 MG Tabs tablet Commonly known as: SENOKOT Take 1 tablet (8.6 mg total) by mouth 2 (two) times daily for 14 days.        Contact information for follow-up providers     Renato Dorothey [name], NP Follow up in 1 week(s).   Specialty: Internal Medicine Contact information: [address] [place] [address] [phone]         Georgina Ozell LABOR, MD Follow up in 1 week(s).   Specialty: Orthopedic Surgery Contact information: [address] [place] [address]  [phone]              Contact information for after-discharge care     Destination     [facility] .   Service: Skilled Nursing Contact information: [address] [facility] [place]  [address] [phone]                    Discharge Exam: Fredricka Weights   09/23/24 2340 09/24/24 0005  Weight: 86 kg 83.9 kg   General; NAD  Condition at discharge: stable  The results of significant diagnostics from this hospitalization (including imaging, microbiology, ancillary and laboratory) are listed below for reference.   Imaging Studies: VAS US  LOWER EXTREMITY VENOUS (DVT) Result Date: 09/30/2024  Lower Venous DVT Study Patient Name:  Connor Bryant  Date of Exam:   09/30/2024 Medical Rec #: [id number]       Accession #:    [id number] Date of Birth: 1946-07-18       Patient Gender: M Patient Age:   79 years Exam Location:  [facility] Procedure:      VAS US  LOWER EXTREMITY VENOUS (DVT) Referring Phys: MICAELA SUNDIL  --------------------------------------------------------------------------------  Indications: Swelling, and Edema. Other Indications: A-fib. Comparison Study: No prior exam. Performing Technologist: Edilia Elden Appl  Examination Guidelines: A complete evaluation includes B-mode imaging, spectral Doppler, color Doppler, and power Doppler as needed of all accessible portions of each vessel. Bilateral testing is considered an integral part of a complete examination. Limited examinations for reoccurring indications may be performed as noted. The reflux portion of the exam is performed with the patient in reverse Trendelenburg.  +---------+---------------+---------+-----------+----------+--------------+ RIGHT    CompressibilityPhasicitySpontaneityPropertiesThrombus Aging +---------+---------------+---------+-----------+----------+--------------+ CFV      Full           Yes      Yes                                 +---------+---------------+---------+-----------+----------+--------------+ SFJ      Full           Yes      Yes                                 +---------+---------------+---------+-----------+----------+--------------+ FV Prox  Full                                                        +---------+---------------+---------+-----------+----------+--------------+ FV Mid   Full                                                        +---------+---------------+---------+-----------+----------+--------------+ FV DistalFull                                                        +---------+---------------+---------+-----------+----------+--------------+ PFV      Full                                                        +---------+---------------+---------+-----------+----------+--------------+  POP      Full           Yes      Yes                                 +---------+---------------+---------+-----------+----------+--------------+ PTV      Full                                                         +---------+---------------+---------+-----------+----------+--------------+ PERO     Full                                                        +---------+---------------+---------+-----------+----------+--------------+   +---------+---------------+---------+-----------+----------+--------------+ LEFT     CompressibilityPhasicitySpontaneityPropertiesThrombus Aging +---------+---------------+---------+-----------+----------+--------------+ CFV      Full           Yes      Yes                                 +---------+---------------+---------+-----------+----------+--------------+ SFJ      Full           Yes      Yes                                 +---------+---------------+---------+-----------+----------+--------------+ FV Prox  Full                                                        +---------+---------------+---------+-----------+----------+--------------+ FV Mid   Full                                                        +---------+---------------+---------+-----------+----------+--------------+ FV DistalFull                                                        +---------+---------------+---------+-----------+----------+--------------+ PFV      Full                                                        +---------+---------------+---------+-----------+----------+--------------+ POP      Full           Yes      Yes                                 +---------+---------------+---------+-----------+----------+--------------+  PTV      Full                                                        +---------+---------------+---------+-----------+----------+--------------+ PERO     Full                                                        +---------+---------------+---------+-----------+----------+--------------+     Summary: RIGHT: - There is no evidence of deep vein thrombosis in  the lower extremity.  - No cystic structure found in the popliteal fossa.  LEFT: - There is no evidence of deep vein thrombosis in the lower extremity.  - No cystic structure found in the popliteal fossa.  *See table(s) above for measurements and observations. Electronically signed by Gaile New MD on 09/30/2024 at 7:07:01 PM.    Final    ECHOCARDIOGRAM COMPLETE Result Date: 09/30/2024    ECHOCARDIOGRAM REPORT   Patient Name:   Connor Bryant Date of Exam: 09/30/2024 Medical Rec #:  [id number]      Height:       70.0 in Accession #:    [id number]     Weight:       185.0 lb Date of Birth:  July 20, 1946      BSA:          2.019 m Patient Age:    78 years       BP:           152/119 mmHg Patient Gender: M              HR:           78 bpm. Exam Location:  Inpatient Procedure: 2D Echo, Cardiac Doppler and Color Doppler (Both Spectral and Color            Flow Doppler were utilized during procedure). Indications:    Chest Pain  History:        Patient has prior history of Echocardiogram examinations, most                 recent 12/16/2023. CAD and Acute MI, Arrythmias:Atrial                 Fibrillation; Risk Factors:Hypertension, Diabetes and                 Dyslipidemia.  Sonographer:    Sherlean Dubin Referring Phys: [id number] SUBRINA SUNDIL  Sonographer Comments: Image acquisition challenging due to respiratory motion. IMPRESSIONS  1. Left ventricular ejection fraction, by estimation, is 55 to 60%. The left ventricle has normal function. The left ventricle has no regional wall motion abnormalities. There is mild concentric left ventricular hypertrophy. Left ventricular diastolic parameters are consistent with Grade II diastolic dysfunction (pseudonormalization).  2. Right ventricular systolic function is normal. The right ventricular size is normal. There is normal pulmonary artery systolic pressure. The estimated right ventricular systolic pressure is 27.7 mmHg.  3. Left atrial size was moderately dilated.  4. The  mitral valve is abnormal. No evidence of mitral valve regurgitation. No evidence of mitral stenosis. Moderate mitral annular calcification.  5. The aortic valve is heavily calcified. It  appears functionally bicuspid. Likely moderate to severe LF/LG AS. Dimensionless index is 0.37. The aortic valve is calcified. There is severe calcifcation of the aortic valve. Aortic valve regurgitation is trivial. Moderate to severe aortic valve stenosis. Aortic valve area, by VTI measures 0.88 cm. Aortic valve mean gradient measures 13.0 mmHg. Aortic valve Vmax measures 2.33 m/s.  6. The inferior vena cava is normal in size with <50% respiratory variability, suggesting right atrial pressure of 8 mmHg. FINDINGS  Left Ventricle: Left ventricular ejection fraction, by estimation, is 55 to 60%. The left ventricle has normal function. The left ventricle has no regional wall motion abnormalities. The left ventricular internal cavity size was normal in size. There is  mild concentric left ventricular hypertrophy. Left ventricular diastolic parameters are consistent with Grade II diastolic dysfunction (pseudonormalization). Right Ventricle: The right ventricular size is normal. No increase in right ventricular wall thickness. Right ventricular systolic function is normal. There is normal pulmonary artery systolic pressure. The tricuspid regurgitant velocity is 2.22 m/s, and  with an assumed right atrial pressure of 8 mmHg, the estimated right ventricular systolic pressure is 27.7 mmHg. Left Atrium: Left atrial size was moderately dilated. Right Atrium: Right atrial size was normal in size. Pericardium: There is no evidence of pericardial effusion. Mitral Valve: The mitral valve is abnormal. Moderate mitral annular calcification. No evidence of mitral valve regurgitation. No evidence of mitral valve stenosis. Tricuspid Valve: The tricuspid valve is normal in structure. Tricuspid valve regurgitation is mild . No evidence of tricuspid  stenosis. Aortic Valve: The aortic valve is heavily calcified. It appears functionally bicuspid. Likely moderate to severe LF/LG AS. Dimensionless index is 0.37. The aortic valve is calcified. There is severe calcifcation of the aortic valve. Aortic valve regurgitation is trivial. Moderate to severe aortic stenosis is present. Aortic valve mean gradient measures 13.0 mmHg. Aortic valve peak gradient measures 21.7 mmHg. Aortic valve area, by VTI measures 0.88 cm. Pulmonic Valve: The pulmonic valve was not well visualized. Pulmonic valve regurgitation is not visualized. No evidence of pulmonic stenosis. Aorta: The aortic root is normal in size and structure. Venous: The inferior vena cava is normal in size with less than 50% respiratory variability, suggesting right atrial pressure of 8 mmHg. IAS/Shunts: No atrial level shunt detected by color flow Doppler.  LEFT VENTRICLE PLAX 2D LVIDd:         5.10 cm   Diastology LVIDs:         3.40 cm   LV e' medial:    5.13 cm/s LV PW:         1.20 cm   LV E/e' medial:  12.9 LV IVS:        1.30 cm   LV e' lateral:   11.00 cm/s LVOT diam:     1.90 cm   LV E/e' lateral: 6.0 LV SV:         41 LV SV Index:   20 LVOT Area:     2.84 cm  RIGHT VENTRICLE             IVC RV S prime:     12.40 cm/s  IVC diam: 1.80 cm TAPSE (M-mode): 2.0 cm LEFT ATRIUM              Index        RIGHT ATRIUM           Index LA diam:        4.90 cm  2.43 cm/m   RA Area:  19.00 cm LA Vol (A2C):   110.0 ml 54.47 ml/m  RA Volume:   54.20 ml  26.84 ml/m LA Vol (A4C):   72.5 ml  35.90 ml/m LA Biplane Vol: 90.6 ml  44.87 ml/m  AORTIC VALVE AV Area (Vmax):    0.94 cm AV Area (Vmean):   0.86 cm AV Area (VTI):     0.88 cm AV Vmax:           233.00 cm/s AV Vmean:          173.000 cm/s AV VTI:            0.466 m AV Peak Grad:      21.7 mmHg AV Mean Grad:      13.0 mmHg LVOT Vmax:         77.60 cm/s LVOT Vmean:        52.300 cm/s LVOT VTI:          0.145 m LVOT/AV VTI ratio: 0.31  AORTA Ao Root diam: 3.20  cm Ao Asc diam:  3.00 cm MITRAL VALVE               TRICUSPID VALVE MV Area (PHT): 3.91 cm    TR Peak grad:   19.7 mmHg MV Decel Time: 194 msec    TR Vmax:        222.00 cm/s MR Peak grad: 39.4 mmHg MR Vmax:      314.00 cm/s  SHUNTS MV E velocity: 66.40 cm/s  Systemic VTI:  0.14 m MV A velocity: 66.40 cm/s  Systemic Diam: 1.90 cm MV E/A ratio:  1.00 Toribio Fuel MD Electronically signed by Toribio Fuel MD Signature Date/Time: 09/30/2024/1:48:03 PM    Final    CT Angio Chest Pulmonary Embolism (PE) W or WO Contrast Addendum Date: 09/30/2024 ADDENDUM #1 ADDENDUM: Positive PE finding was discussed by telephone with Floor Nurse Crystal at [phone] hours on 09/30/2024. ---------------------------------------------------- Electronically signed by: Helayne Hurst MD 09/30/2024 06:47 AM EST RP Workstation: [id number]   Result Date: 09/30/2024  ORIGINAL REPORT EXAM: CTA of the Chest with contrast for PE 09/30/2024 06:12:49 AM TECHNIQUE: CTA of the chest was performed after the administration of 75 mL of iohexol  (OMNIPAQUE ) 350 MG/ML intravenous contrast. Multiplanar reformatted images are provided for review. MIP images are provided for review. Automated exposure control, iterative reconstruction, and/or weight based adjustment of the mA/kV was utilized to reduce the radiation dose to as low as reasonably achievable. COMPARISON: CTA chest 05/08/2020. CLINICAL HISTORY: 79 year old male. Rule out pulmonary embolism. Status post right femur fracture and open reduction internal fixation. FINDINGS: PULMONARY ARTERIES: Pulmonary arteries are adequately opacified for evaluation. Right lower lobe segmental or subsegmental pulmonary artery thrombus is present in the posterior basal segment region (series 7, image 165). No other pulmonary artery filling defect identified. Main pulmonary artery is normal in caliber. MEDIASTINUM: The heart demonstrates a mitral valve prosthesis and severe calcified coronary artery atherosclerosis. No  pericardial effusion. There is no acute abnormality of the thoracic aorta. The aorta is heavily calcified and demonstrates chronic tortuosity of the distal descending thoracic aorta, which is stable. No contrast is seen within the aorta. LYMPH NODES: No mediastinal, hilar or axillary lymphadenopathy. LUNGS AND PLEURA: Lung volumes are similar to the 2021 CTA with mild atelectatic changes to the major airways. Asymmetric right lower lobe bronchial wall thickening is noted (series 6, image 83). There is streaky and confluent right lower lobe basilar segmental opacity which may be a combination of infarction and atelectasis. Contralateral  dependent left lower lobe atelectasis is present. No other confluent lung opacity. Trace right pleural effusion. No pneumothorax. UPPER ABDOMEN: Limited images of the upper abdomen demonstrate punctate gallstones within the neck of the gallbladder, which otherwise appears grossly stable. SOFT TISSUES AND BONES: Osteopenia. Moderate chronic T12 compression fracture is stable since 2021. Mild T11 compression fracture is stable. Mild T9 compression fracture is stable. Mild T4 inferior endplate compression is new since 2021 but otherwise age indeterminate, with 15% loss of vertebral body height. No retropulsion. Chronic distal left clavicle fracture is partially visible. Otherwise, no acute osseous abnormality. IMPRESSION: 1. Positive for small right lower lobe segmental or subsegmental pulmonary artery embolus. Right lower lobe basilar opacity may represent a combination of infarct and atelectasis, with trace right pleural effusion. 2. Mild T4 inferior endplate compression fracture, new since 2021 but the age is indeterminate (15% loss of height). 3. Advanced aortic and coronary artery atherosclerosis. 4. Chronic cholelithiasis. Electronically signed by: Helayne Hurst MD 09/30/2024 06:26 AM EST RP Workstation: [id number]   DG FEMUR, MIN 2 VIEWS RIGHT Result Date: 09/24/2024 CLINICAL  DATA:  Postop.  Right hip fracture. EXAM: DG FEMUR 2+V*R* COMPARISON:  Preoperative imaging FINDINGS: Femoral intramedullary nail with trans trochanteric and distal locking screw fixation of comminuted proximal femur fracture. Persistent displacement of lesser trochanteric fracture fragment. Recent postsurgical change includes air and edema in the soft tissues with overlying skin staples in place. IMPRESSION: ORIF of comminuted proximal femur fracture. No immediate postoperative complication. Electronically Signed   By: Andrea Gasman M.D.   On: 09/24/2024 18:51   DG FEMUR, MIN 2 VIEWS RIGHT Result Date: 09/24/2024 EXAM: FLUOROSCOPIC IMAGING TECHNIQUE: Fluoroscopy was provided by the radiology department for procedure. Radiologist was not present during examination. RADIATION DOSE INDEX: Reference Air Kerma: 47 mGy COMPARISON: Day x. CLINICAL HISTORY: [id number] Surgery, elective [phone] Surgery, elective FINDINGS: Intraoperative fluoroscopic imaging of the right femur was performed on 09/24/2024 at 12:31:25 PM. Fluoroscopy time was 182 seconds. Interlocking Intramedullary nail fixation of the known right intertrochanteric femur fracture is demonstrated. IMPRESSION: 1. Intraoperative fluoroscopic imaging as above. Please refer to the operative report for full details. Electronically signed by: Morgane Naveau MD 09/24/2024 06:03 PM EST RP Workstation: [id number]   DG C-Arm 1-60 Min-No Report Result Date: 09/24/2024 Fluoroscopy was utilized by the requesting physician.  No radiographic interpretation.   DG C-Arm 1-60 Min-No Report Result Date: 09/24/2024 Fluoroscopy was utilized by the requesting physician.  No radiographic interpretation.   DG FEMUR, MIN 2 VIEWS RIGHT Result Date: 09/24/2024 EXAM: 2 VIEW(S) XRAY OF THE RIGHT FEMUR 09/24/2024 02:47:00 AM COMPARISON: None available. CLINICAL HISTORY: Pain FINDINGS: BONES AND JOINTS: A right femoral neck fracture was seen on hip x-rays, not included on the Fuchs  views of the right femur. No malalignment. SOFT TISSUES: Vascular calcifications. IMPRESSION: 1. Right femoral neck fracture seen on the hip radiographs, not included on the Fuchs views of the right femur. The distal visualized femur is intact. 2. Vascular calcifications. Electronically signed by: Dorethia Molt MD 09/24/2024 03:15 AM EST RP Workstation: [id number]   DG Hip Unilat  With Pelvis 2-3 Views Right Result Date: 09/24/2024 EXAM: 2 or more VIEW(S) XRAY OF THE RIGHT HIP 09/24/2024 12:09:00 AM COMPARISON: None available. CLINICAL HISTORY: fall FINDINGS: BONES AND JOINTS: Comminuted intertrochanteric fracture of right hip with varus angulation. SOFT TISSUES: The soft tissues are unremarkable. VASCULATURE: Vascular stent in right common iliac artery region. Vascular calcifications. IMPRESSION: 1. Comminuted intertrochanteric fracture of the right hip with varus angulation.  Electronically signed by: Greig Pique MD 09/24/2024 12:28 AM EST RP Workstation: [id number]   DG Chest Port 1 View Result Date: 09/24/2024 EXAM: 1 VIEW(S) XRAY OF THE CHEST 09/24/2024 12:08:00 AM COMPARISON: 11/29/2023 CLINICAL HISTORY: pain FINDINGS: LUNGS AND PLEURA: No focal pulmonary opacity. No pleural effusion. No pneumothorax. HEART AND MEDIASTINUM: Cardiomegaly, unchanged. Atherosclerotic calcifications. BONES AND SOFT TISSUES: Chronic left clavicular fracture. No acute osseous abnormality. IMPRESSION: 1. No acute findings. 2. Cardiomegaly, unchanged. 3. Atherosclerotic calcifications. Electronically signed by: Greig Pique MD 09/24/2024 12:28 AM EST RP Workstation: [id number]    Microbiology: Results for orders placed or performed during the hospital encounter of 07/23/24  Surgical pcr screen     Status: None   Collection Time: 07/23/24 10:09 AM   Specimen: Nasal Mucosa; Nasal Swab  Result Value Ref Range Status   MRSA, PCR NEGATIVE NEGATIVE Final   Staphylococcus aureus NEGATIVE NEGATIVE Final    Comment: (NOTE) The  Xpert SA Assay (FDA approved for NASAL specimens in patients 39 years of age and older), is one component of a comprehensive surveillance program. It is not intended to diagnose infection nor to guide or monitor treatment. Performed at [facility], [address] [place], [place] [address]     Labs: CBC: Recent Labs  Lab 09/27/24 [id number] 09/29/24 0446 09/29/24 2359 09/30/24 0808 10/01/24 0305 10/02/24 0446  WBC 8.1 7.9 10.0 8.0 8.1 7.5  NEUTROABS 5.4 5.6  --   --   --   --   HGB 8.0* 8.3* 8.8* 7.9* 7.5* 8.5*  HCT 23.9* 25.3* 27.0* 23.9* 23.3* 26.3*  MCV 89.8 91.3 90.3 89.8 91.0 91.0  PLT 182 244 281 275 268 308   Basic Metabolic Panel: Recent Labs  Lab 09/29/24 0446 09/29/24 2359 09/30/24 0808 10/01/24 0305  NA 136 136 135 134*  K 4.2 4.3 4.2 4.0  CL 100 98 98 99  CO2 26 26 27 25   GLUCOSE 158* 158* 147* 150*  BUN 33* 34* 32* 33*  CREATININE 1.29* 1.34* 1.29* 1.39*  CALCIUM  9.1 9.1 8.9 8.8*  MG 2.1  --   --   --    Liver Function Tests: No results for input(s): AST, ALT, ALKPHOS, BILITOT, PROT, ALBUMIN  in the last 168 hours. CBG: Recent Labs  Lab 10/01/24 0755 10/01/24 1145 10/01/24 1706 10/01/24 2038 10/02/24 0754  GLUCAP 130* 129* 163* 142* 131*    Discharge time spent: greater than 30 minutes.  Signed: Owen DELENA Lore, MD [facility] 10/02/2024 "

## 2024-10-02 NOTE — Evaluation (Signed)
 Occupational Therapy Evaluation Patient Details Name: Connor Bryant MRN: [id number] DOB: [birth year] Today's Date: 10/02/2024   History of Present Illness   Pt is 79 yo presenting [facility] on [facility] due to fall. Currently pt is s/p ORIF with cephalomedullary rod on 09/24/2024. PE found on 1/6 after pt reporting chest pain; heparin  was started. PMH: CAD s/p stenting, afib s/p watchman, GIB, HFpEF, DM II.     Clinical Impressions At baseline, pt is Independent to Mod I for all ADLs and IADLs and Independent with functional mobility without an AD. Pt now presents with decreased activity tolerance, decreased balance, decreased B UE strength, pain affecting functional level, increased fear of falling, decreased knowledge of AE/DME, and decreased safety and independence with functional tasks. Pt currently largely completing UB ADLs Independent to Contact guard assist, LB ADLs with Total assist +2 in sit/stand, and squat-pivot transfer with Mod assist. Pt participated well in session and is motivated to return to PLOF. Pt VSS on RA. Pt will benefit from acute skilled OT services to address deficits and increase safety and independence with functional tasks, including training in use of AE for LB ADLs. Post acute discharge, pt will benefit from intensive inpatient skilled rehab services < 3 hours per day to maximize rehab potential.      If plan is discharge home, recommend the following:   Two people to help with walking and/or transfers;Two people to help with bathing/dressing/bathroom;Assistance with cooking/housework;Assist for transportation;Help with stairs or ramp for entrance     Functional Status Assessment   Patient has had a recent decline in their functional status and demonstrates the ability to make significant improvements in function in a reasonable and predictable amount of time.     Equipment Recommendations   Tub/shower seat;Other (comment) (RW)     Recommendations for Other  Services         Precautions/Restrictions   Precautions Precautions: Fall Recall of Precautions/Restrictions: Impaired Restrictions Weight Bearing Restrictions Per Provider Order: Yes RLE Weight Bearing Per Provider Order: Weight bearing as tolerated     Mobility Bed Mobility               General bed mobility comments: Pt sitting in recliner at beginning of session and on BSC at end of session    Transfers Overall transfer level: Needs assistance Equipment used: 1 person hand held assist (OT positioned in front of pt with use of gait belt) Transfers: Sit to/from Stand, Bed to chair/wheelchair/BSC Sit to Stand: Mod assist     Step pivot transfers: Mod assist     General transfer comment: from recliner to [facility]      Balance Overall balance assessment: Needs assistance Sitting-balance support: Single extremity supported, Bilateral upper extremity supported, No upper extremity supported, Feet supported Sitting balance-Leahy Scale: Fair Sitting balance - Comments: sitting in recliner both with back support and sitting forward in the chair   Standing balance support: Bilateral upper extremity supported, During functional activity (REliant on support of OT) Standing balance-Leahy Scale: Poor Standing balance comment: reliant on support of OT                           ADL either performed or assessed with clinical judgement   ADL Overall ADL's : Needs assistance/impaired Eating/Feeding: Independent;Sitting   Grooming: Set up;Sitting   Upper Body Bathing: Set up;Contact guard assist;Sitting   Lower Body Bathing: Total assistance;+2 for safety/equipment;+2 for physical assistance;Sit to/from stand  Upper Body Dressing : Set up;Contact guard assist;Sitting   Lower Body Dressing: Total assistance;+2 for physical assistance;+2 for safety/equipment;Sit to/from stand   Toilet Transfer: Moderate assistance;Squat-pivot;BSC/3in1   Toileting- Clothing  Manipulation and Hygiene: Total assistance;+2 for safety/equipment;+2 for physical assistance;Sit to/from stand         General ADL Comments: Pt with decreased activity tolerance, fatiguing quickly during tasks     Vision Baseline Vision/History: 1 Wears glasses Ability to See in Adequate Light: 0 Adequate (with glasses) Patient Visual Report: No change from baseline Additional Comments: Vision WFL with tasks assessed     Perception         Praxis         Pertinent Vitals/Pain Pain Assessment Pain Assessment: 0-10 Pain Score: 5  Pain Location: R hip Pain Descriptors / Indicators: Discomfort, Grimacing, Aching Pain Intervention(s): Limited activity within patient's tolerance, Monitored during session, Repositioned     Extremity/Trunk Assessment Upper Extremity Assessment Upper Extremity Assessment: Right hand dominant;Generalized weakness (B UE AROM, coordination, and sensation WFL; B shoulder strength grossly 3/5 and otherwise 3+ to 4/5 Bilateraly)   Lower Extremity Assessment Lower Extremity Assessment: Defer to PT evaluation   Cervical / Trunk Assessment Cervical / Trunk Assessment: Normal   Communication Communication Communication: No apparent difficulties   Cognition Arousal: Alert Behavior During Therapy: WFL for tasks assessed/performed Cognition: No apparent impairments             OT - Cognition Comments: Pt AAOx4 and pleasant throughout session with cognition WFL for tasks assessed. Pt demonstrates some signs of anxiety and fear related to falling during functional transfers, but was able to participate well and complete squat-pivot transfer during session. Suspect pt's anxiety/fear will resolve with additional successful transfer attempts.                 Following commands: Intact       Cueing  General Comments   Cueing Techniques: Verbal cues;Visual cues  VSS on RA   Exercises     Shoulder Instructions      Home Living  Family/patient expects to be discharged to:: Private residence Living Arrangements: Alone Available Help at Discharge: Family;Friend(s);Available PRN/intermittently Type of Home: House Home Access: Stairs to enter [employer] of Steps: 1   Home Layout: One level     Bathroom Shower/Tub: Producer, Television/film/video: Standard Bathroom Accessibility: No   Home Equipment: Agricultural Consultant (2 wheels);Cane - single point;Cane - quad;BSC/3in1          Prior Functioning/Environment Prior Level of Function : Independent/Modified Independent             Mobility Comments: Ind ADLs Comments: Ind to Mod I for all ADLs and IADLs.    OT Problem List: Decreased strength;Decreased activity tolerance;Impaired balance (sitting and/or standing);Decreased knowledge of use of DME or AE;Pain   OT Treatment/Interventions: Self-care/ADL training;Therapeutic exercise;Energy conservation;DME and/or AE instruction;Therapeutic activities;Patient/family education;Balance training      OT Goals(Current goals can be found in the care plan section)   Acute Rehab OT Goals Patient Stated Goal: to heal well and be able to be independent again OT Goal Formulation: With patient Time For Goal Achievement: 10/16/24 Potential to Achieve Goals: Good ADL Goals Pt Will Perform Lower Body Bathing: with min assist;sit to/from stand;with adaptive equipment Pt Will Perform Lower Body Dressing: with min assist;with adaptive equipment;sit to/from stand Pt Will Transfer to Toilet: with min assist;ambulating;bedside commode (with least restrictive AD) Pt Will Perform Toileting - Clothing Manipulation and  hygiene: with min assist;sit to/from stand (with adaptive equipment as needed)   OT Frequency:  Min 2X/week    Co-evaluation              AM-PAC OT 6 Clicks Daily Activity     Outcome Measure Help from another person eating meals?: None Help from another person taking care of personal  grooming?: A Little Help from another person toileting, which includes using toliet, bedpan, or urinal?: Total Help from another person bathing (including washing, rinsing, drying)?: A Lot Help from another person to put on and taking off regular upper body clothing?: A Little Help from another person to put on and taking off regular lower body clothing?: Total 6 Click Score: 14   End of Session Equipment Utilized During Treatment: Gait belt;Other (comment) [facility]) Nurse Communication: Mobility status;Other (comment);Need for lift equipment (Pt sitting on BSC with call bell in reach at end of session. REcommend +2 and use of Stedy for transfers with Nursing staff)  Activity Tolerance: Patient tolerated treatment well Patient left: Other (comment);with call bell/phone within reach (sitting on BSC with RN and NT notified)  OT Visit Diagnosis: Unsteadiness on feet (R26.81);Other abnormalities of gait and mobility (R26.89);Muscle weakness (generalized) (M62.81)                Time: 1020-1047 OT Time Calculation (min): 27 min Charges:  OT General Charges $OT Visit: 1 Visit OT Evaluation $OT Eval Low Complexity: 1 Low OT Treatments $Self Care/Home Management : 8-22 mins  Margarie Rockey [name]., OTR/L, MA Acute Rehab [phone]   Margarie FORBES Horns 10/02/2024, 12:13 PM

## 2024-10-02 NOTE — TOC Transition Note (Signed)
 Transition of Care [facility]) - Discharge Note   Patient Details  Name: Connor Bryant MRN: [id number] Date of Birth: July 01, 1946  Transition of Care [facility]) CM/SW Contact:  Sherline Clack, LCSWA Phone Number: 10/02/2024, 2:16 PM   Clinical Narrative:     Patient will DC to: [facility] Anticipated DC date: 10/02/2024  Family notified: Aaron/son Transport by: [facility]   Per MD patient ready for DC to [facility] RN to call report prior to discharge [phone], rm [phone]). RN, patient, patient's family, and facility notified of DC. Discharge Summary and FL2 sent to facility. DC packet on chart. Ambulance transport requested for patient.   CSW will sign off for now as social work intervention is no longer needed. Please consult us  again if new needs arise.    Final next level of care: Skilled Nursing Facility Barriers to Discharge: Barriers Resolved   Patient Goals and CMS Choice Patient states their goals for this hospitalization and ongoing recovery are:: to get better and back home CMS Medicare.gov Compare Post Acute Care list provided to:: Patient Choice offered to / list presented to : Patient, Adult Children [place] ownership interest in [facility].provided to:: Patient    Discharge Placement                Patient to be transferred to facility by: [facility] to [facility] Name of family member notified: Jahzion Brogden Patient and family notified of of transfer: 10/02/24  Discharge Plan and Services Additional resources added to the After Visit Summary for       Post Acute Care Choice: Skilled Nursing Facility                               Social Drivers of Health (SDOH) Interventions SDOH Screenings   Food Insecurity: No Food Insecurity (09/24/2024)  Housing: Low Risk (09/24/2024)  Transportation Needs: No Transportation Needs (09/24/2024)  Utilities: Not At Risk (09/24/2024)  Alcohol Screen: Low Risk (12/04/2023)  Depression  (PHQ2-9): Low Risk (05/28/2024)  Financial Resource Strain: Low Risk (12/09/2023)  Physical Activity: Inactive (12/04/2023)  Social Connections: Moderately Isolated (09/24/2024)  Stress: No Stress Concern Present (12/09/2023)  Tobacco Use: Medium Risk (09/24/2024)  Health Literacy: Adequate Health Literacy (12/09/2023)     Readmission Risk Interventions    12/02/2023   10:06 AM 11/29/2023   10:09 AM 11/27/2023    8:43 AM  Readmission Risk Prevention Plan  Transportation Screening Complete Complete Complete  HRI or Home Care Consult  Complete Complete  Social Work Consult for Recovery Care Planning/Counseling  Complete Complete  Palliative Care Screening  Not Applicable Not Applicable  Medication Review Oceanographer) Complete Complete Complete  HRI or Home Care Consult Complete    SW Recovery Care/Counseling Consult Complete    Palliative Care Screening Not Applicable    Skilled Nursing Facility Not Applicable

## 2024-10-02 NOTE — Progress Notes (Signed)
 Pharmacy Consult for apixaban  Indication: pulmonary embolus  Allergies[1]  Patient Measurements: Height: 5' 10 (177.8 cm) Weight: 83.9 kg (185 lb) IBW/kg (Calculated) : 73 HEPARIN  DW (KG): 83.9  Vital Signs: Temp: 98.1 F (36.7 C) (01/09 0755) BP: 119/52 (01/09 0755) Pulse Rate: 82 (01/09 0755)  Labs: Recent Labs    09/29/24 2359 09/30/24 9191 09/30/24 2042 10/01/24 0305 10/01/24 0844 10/01/24 2022 10/02/24 0446  HGB 8.8* 7.9*  --  7.5*  --   --  8.5*  HCT 27.0* 23.9*  --  23.3*  --   --  26.3*  PLT 281 275  --  268  --   --  308  HEPARINUNFRC  --   --    < >  --  0.18* 0.27* 0.41  CREATININE 1.34* 1.29*  --  1.39*  --   --   --    < > = values in this interval not displayed.    Estimated Creatinine Clearance: 44.5 mL/min (A) (by C-G formula based on SCr of 1.39 mg/dL (H)).  Assessment: Connor Bryant a 79 y.o. male presented with hip fx, now with PE on CT angio s/p ORIF. Pharmacy has been consulted for heparin  dosing.   Anticoagulation PTA: Patient not on anticoagulation PTA.  1/9 AM: Consulted to transition to apixaban . No bleeding noted, Hgb low stable 8s, platelets are normal.  Goal of Therapy:  Full anticoagulation   Plan:  Stop heparin  infusion Apixaban  10 mg PO bid for 7 days then 5 mg PO bid Education prior to d/c Pharmacy signing off but will continue to follow peripherally - please re-consult if needed  Thank you for involving pharmacy in this patient's care.  Delon Sax, PharmD, BCPS Clinical Pharmacist Clinical phone for 10/02/2024 is [phone] 10/02/2024 9:13 AM             [1] No Known Allergies

## 2024-10-02 NOTE — Telephone Encounter (Signed)
 Pharmacy Patient Advocate Encounter  Insurance verification completed.    The patient is insured through [facility]. Patient has Medicare and is not eligible for a copay card, but may be able to apply for patient assistance or Medicare RX Payment Plan (Patient Must reach out to their plan, if eligible for payment plan), if available.    Ran test claim for Eliquis  5mg  tablet and the current 30 day co-pay is $249.45 due to deductible.   This test claim was processed through [facility]- copay amounts may vary at other pharmacies due to pharmacy/plan contracts, or as the patient moves through the different stages of their insurance plan.

## 2024-10-06 ENCOUNTER — Encounter (HOSPITAL_COMMUNITY): Payer: Self-pay | Admitting: Orthopedic Surgery

## 2024-10-13 NOTE — Care Plan (Signed)
 LTC Post Acute Care Conference [facility])  10/13/2024 3:25 PM    Patient Name: Connor Bryant MRN:  [id number] Admit Date/Time: 10/02/2024  5:17 PM Date of Birth:  [birth year] Sex:  Male Room/Bed: T845/T845-98   Patient Active Problem List   Diagnosis Date Noted   Cognitive communication deficit 10/05/2024   Muscle weakness (generalized) 10/04/2024   Chronic atrial fibrillation    (CMS-HCC) 10/04/2024   Frequent falls 10/04/2024   Old myocardial infarction 10/04/2024   S/P coronary artery stent placement 10/04/2024   Diastolic heart failure (CMS-HCC) 10/04/2024   Type II diabetes mellitus with nephropathy (CMS-HCC) 10/04/2024   Type 2 diabetes mellitus with diabetic neuropathy (CMS-HCC) 10/04/2024   Intertrochanteric fx-closed, right, with routine healing, subsequent encounter 10/02/2024   History of fall 10/02/2024   Acute blood loss anemia 10/02/2024   Iron  deficiency anemia, unspecified 10/02/2024   Coronary atherosclerosis of native coronary artery 10/02/2024   Presence of Watchman left atrial appendage closure device 10/02/2024   Essential hypertension, benign 10/02/2024   Hyperlipidemia 10/02/2024   Gastroesophageal reflux disease 10/02/2024   Stage 3b chronic kidney disease (CMS-HCC) 10/02/2024   Parkinsonism    (CMS-HCC) 10/02/2024   Acute pulmonary embolism    (CMS-HCC) 10/02/2024   Constipation 10/02/2024   Arthritis 10/02/2024   History of stroke 10/02/2024   Pain 10/02/2024   Vitamin B12 deficiency 10/02/2024   Screening examination for pulmonary tuberculosis 10/02/2024     Anticipated discharge date:  insurance has given a target LCD of 10/18/24  Team Updates   Met in SW office this afternoon with resident;  met over phone with son.  Resident lived alone and was independent with all ADL's;  driving, some cooking, giving his own medicine;  he has someone who comes in and cleans for him.   Resident took care of himself and his 58  year old cat.   He said he did not use any assistive device to ambulate.   He does not have any steps to enter his home nor does he have stairs inside of his home.  Reviewed [facility] insurance coverage and copays that start on day 21:  $218 per day.   We went over that insurance has given a target LCD of 10/18/24 with an update due to be sent in tomorrow to [facility].  After talking to resident, he does not feel he is ready or safe to return home at this time.  He understands he will have the right to file an appeal with [facility] once insurance issues a 48 hour NOMNC.  He said he will appeal.   If he loses, he understands to stay here and continue to get therapy;  his room and board will be OOP and we discussed that amount.   His goal remains to return home, however, he said he is going to need longer than the time insurance will allow.  Resident said he has been in rehab before and was able to return home in less than 20 days, but this time and these circumstances are different and worse.  SW gave resident our contact information and list of HH agencies and he took one to give his son who will be visiting later today.  Resident said he has used [facility] before but could not remember which company right off.   He said he will let me know.  PCP:  Dr. Dorothey Cassette Pharmacy:   [facility]  DME:   resident has a FWW, canes and BSC;  has a wheelchair he can borrow if needed - resident has a follow up tomorrow with his surgeon;  has surgical incision;  SW recommended he look into getting a life alert when he discharges home  Discharge Planning Barriers: lives alone;  son checks on him but works  Anticipated Discharge Location: home  ADL:   set up with UB bathing, dressing;  min. Assist with lower body;  contact guard with transfers  Mobility: walking 50 feet using a FWW, contact  Cognition / Communication: working with speech therapy on cognition;  therapy has put resident on the list to be gotten OOB  around 6:30 a.m.;  encouraged him to use call light and wait for staff to assist him to the bathroom versus using his brief;  he did not use a brief prior when at home and was taking himself to the bathroom  Community Reintegration: supportive son

## 2024-10-14 ENCOUNTER — Ambulatory Visit: Admitting: Orthopedic Surgery

## 2024-10-14 ENCOUNTER — Other Ambulatory Visit

## 2024-10-14 DIAGNOSIS — S728X1A Other fracture of right femur, initial encounter for closed fracture: Secondary | ICD-10-CM

## 2024-10-14 NOTE — Progress Notes (Signed)
 Orthopedic Surgery Post-operative Office Visit  Procedure: right peritrochanteric femur fracture CMN Date of Surgery: 09/24/2024 (~2 weeks post-op)  Assessment: Patient is a 79 y.o. who is doing as expected after surgery    Plan: -Operative plans complete -Staples removed today in office -Okay to let soap/water run over incisions but do not submerge -DVT ppx: home plavix  -Pain management: weaning oxycodone  -Return to office in 4 weeks, x-rays needed at next visit: AP/lateral right femur  ___________________________________________________________________________   Subjective: Patient was discharged to a skilled nursing facility after his hospitalization.  He has remained there since that time.  He has been working with therapy at the facility.  He said he is up to walking 40 feet with a walker.  His pain has been getting better with time.  He does use oxycodone  still periodically.  He mostly needs it before and after working with therapy.  He has not noticed any redness or drainage around his incisions.  Objective:  General: no acute distress, appropriate affect Neurologic: alert, answering questions appropriately, following commands Respiratory: unlabored breathing on room air Skin: incisions are well-approximated with no erythema, induration, active/visual drainage  MSK (RLE): EHL/TA/GSC intact, plantar flexes and dorsiflexes toes, sensation intact to light touch in sural/saphenous/deep peroneal/superficial peroneal/tibial nerve distributions, foot warm well-perfused  Imaging: XRs of the right femur from 10/15/2023 were independently reviewed and interpreted, showing a well aligned peritrochanteric femur fracture with comminution.  No lucency seen around the interlocking or lag screws.  No new fracture seen.  No dislocation seen.   Patient name: Connor Bryant Patient MRN: [id number] Date of visit: 10/14/24

## 2024-10-14 NOTE — LTC Provider Review (Signed)
 PHYSICAL THERAPY TREATMENT NOTE      Patient Name:  Connor Bryant      Medical Record Number: [id number]  Date of Birth: [birth year] Location: [facility] [place]        PT Precautions: Falls, WBAT RLE, Full code OT Precautions: Falls, hip precautions, WBAT RLE SLP Precautions:  ,    Weight Bearing: RUE Weight Bearing: Weight bearing as tolerated, No Restrictions   LUE Weight Bearing: Weight bearing as tolerated, No Restrictions   RLE Weight Bearing: No Restrictions   LLE Weight Bearing: Weight bearing as tolerated, No Restrictions    SUBJECTIVE Patient reports he has an appointment this afternoon with ortho.  OBJECTIVE Today's Treatment:      Weight Bearing Status: WBAT        Surface 1: Level tile Device 1: Rolling walker Other Apparatus 1: Wheelchair follow Assistance 1: Contact guard Quality of Gait 1: Step-to gait pattern Comments/Distance (ft) 1: patient ambulated using 2WW approximately 37ft each way,cues for walker positioning at trunk, 76ft x 2 trials in hallway using 2WW     Balance Training: Patient was instructed in standing tolerance in // bars with lateral wt shifts, lateral side stepping and wt acceptence through RLE to increase wt bearing activities and improve unilateral stability. Patient can stand for about 5 minutes prior to requring to rest. Difficulty initiating RLE into hip flexion and full wt shift onto RLE.     Standing-Exercises: Specific exercises Standing-Exercise Type: Hip flexion, Hip extension, ABduction, ADduction, Knee flexion, Heel raises, Toe raises, Mini-squats Standing-Exercise Comments: patient was instructed in standing exercises 2x 10 reps each with visual and verbal cues for technique within // bars. Multiple rest breaks due to discomfort.      Nu Step Comments: Patient completed nustep x 15 minutes to increase cardiovascular endurance, strength, reciprocal gait pattern and improve  functional mobility. Educated patient on comfort of RLE ROM without resistances. Increased seat after a few minutes each time to increase hip flexion ROM to tolerance.    I attest that I have reviewed the above information. Signed: Rankin Lawrence, PT 10/14/2024

## 2024-10-15 ENCOUNTER — Telehealth: Payer: Self-pay

## 2024-10-15 NOTE — Transitions of Care (Post Inpatient/ED Visit) (Signed)
" ° °  10/15/2024  Name: Connor Bryant MRN: [id number] DOB: 20-Dec-1945  Today's TOC FU Call Status: Today's TOC FU Call Status:: Successful TOC FU Call Completed TOC FU Call Complete Date: 10/15/24  Patient's Name and Date of Birth confirmed. Name, DOB  Transition Care Management Follow-up Telephone Call Date of Discharge:  (Patient actually remains in [facility] rehab, had an appointment yesterday)  Spoke with patient who confirms he is still in [facility] and that he has a new PCP since last year and everyone knows but everyone keeps calling me and I tell everyone who my PCP is now.  Explained to patient that he needs to correct his PCP with his insurance company because he is still associated in the [facility].  Patient verbalized understanding.  Patient remains in [facility] and no TOC questions were performed today as patient states his needs are being met in SNF rehab.  Richerd Fish, RN, BSN, CCM [facility] RN Care Manager Direct Dial: [phone]       "

## 2024-10-19 ENCOUNTER — Other Ambulatory Visit (HOSPITAL_COMMUNITY): Payer: Self-pay

## 2024-10-19 ENCOUNTER — Telehealth (HOSPITAL_COMMUNITY): Payer: Self-pay

## 2024-10-19 NOTE — Telephone Encounter (Signed)
 Advanced Heart Failure Patient Advocate Encounter  The patient was renewed for a [employer] grant that will help cover the cost of Eliquis , Farxiga .  Total amount awarded, $7,500.  Effective: 09/19/2024 - 09/18/2025.  BIN [id number] PCN [id number] [id number] ID [id number]  Pharmacy provided with approval and processing information. Patient informed via MyChart.  Rachel DEL, CPhT Rx Patient Advocate Phone: [phone]

## 2024-11-16 ENCOUNTER — Encounter: Admitting: Orthopedic Surgery

## 2024-12-04 ENCOUNTER — Ambulatory Visit

## 2024-12-11 ENCOUNTER — Ambulatory Visit: Payer: Self-pay

## 2025-01-04 ENCOUNTER — Ambulatory Visit: Admitting: Student

## 2025-02-22 ENCOUNTER — Ambulatory Visit (HOSPITAL_COMMUNITY)
